# Patient Record
Sex: Male | Born: 1935 | Race: White | Hispanic: No | Marital: Married | Smoking: Former smoker
Health system: Southern US, Community
[De-identification: ages and names within clinical notes are randomized; demographics above are authoritative.]

## PROBLEM LIST (undated history)

## (undated) DIAGNOSIS — I639 Cerebral infarction, unspecified: Secondary | ICD-10-CM

## (undated) DIAGNOSIS — I2510 Atherosclerotic heart disease of native coronary artery without angina pectoris: Secondary | ICD-10-CM

## (undated) DIAGNOSIS — H349 Unspecified retinal vascular occlusion: Secondary | ICD-10-CM

## (undated) DIAGNOSIS — E039 Hypothyroidism, unspecified: Secondary | ICD-10-CM

## (undated) DIAGNOSIS — I714 Abdominal aortic aneurysm, without rupture, unspecified: Secondary | ICD-10-CM

## (undated) DIAGNOSIS — Z952 Presence of prosthetic heart valve: Secondary | ICD-10-CM

## (undated) DIAGNOSIS — I5030 Unspecified diastolic (congestive) heart failure: Secondary | ICD-10-CM

## (undated) DIAGNOSIS — N400 Enlarged prostate without lower urinary tract symptoms: Secondary | ICD-10-CM

## (undated) DIAGNOSIS — I350 Nonrheumatic aortic (valve) stenosis: Secondary | ICD-10-CM

## (undated) DIAGNOSIS — K219 Gastro-esophageal reflux disease without esophagitis: Secondary | ICD-10-CM

## (undated) DIAGNOSIS — I25810 Atherosclerosis of coronary artery bypass graft(s) without angina pectoris: Secondary | ICD-10-CM

## (undated) DIAGNOSIS — H9190 Unspecified hearing loss, unspecified ear: Secondary | ICD-10-CM

## (undated) DIAGNOSIS — I723 Aneurysm of iliac artery: Secondary | ICD-10-CM

## (undated) DIAGNOSIS — H5440 Blindness, one eye, unspecified eye: Secondary | ICD-10-CM

## (undated) DIAGNOSIS — I480 Paroxysmal atrial fibrillation: Secondary | ICD-10-CM

## (undated) DIAGNOSIS — I10 Essential (primary) hypertension: Secondary | ICD-10-CM

## (undated) HISTORY — DX: Atherosclerosis of coronary artery bypass graft(s) without angina pectoris: I25.810

## (undated) HISTORY — DX: Nonrheumatic aortic (valve) stenosis: I35.0

## (undated) HISTORY — DX: Abdominal aortic aneurysm, without rupture: I71.4

## (undated) HISTORY — PX: BUNIONECTOMY WITH HAMMERTOE RECONSTRUCTION: SHX5600

## (undated) HISTORY — DX: Atherosclerotic heart disease of native coronary artery without angina pectoris: I25.10

## (undated) HISTORY — DX: Benign prostatic hyperplasia without lower urinary tract symptoms: N40.0

## (undated) HISTORY — PX: JOINT REPLACEMENT: SHX530

## (undated) HISTORY — PX: HEMIARTHROPLASTY HIP: SUR652

## (undated) HISTORY — DX: Unspecified retinal vascular occlusion: H34.9

## (undated) HISTORY — PX: CARDIAC CATHETERIZATION: SHX172

## (undated) HISTORY — DX: Gastro-esophageal reflux disease without esophagitis: K21.9

## (undated) HISTORY — DX: Abdominal aortic aneurysm, without rupture, unspecified: I71.40

## (undated) HISTORY — DX: Aneurysm of iliac artery: I72.3

## (undated) HISTORY — DX: Unspecified diastolic (congestive) heart failure: I50.30

## (undated) HISTORY — PX: COLONOSCOPY: SHX174

## (undated) HISTORY — DX: Essential (primary) hypertension: I10

## (undated) HISTORY — PX: CATARACT EXTRACTION W/ INTRAOCULAR LENS IMPLANT: SHX1309

## (undated) HISTORY — PX: TOTAL KNEE ARTHROPLASTY: SHX125

---

## 1966-03-28 HISTORY — PX: OTHER SURGICAL HISTORY: SHX169

## 1980-03-28 HISTORY — PX: HARVEST BONE GRAFT: SHX377

## 1995-03-30 HISTORY — PX: CAROTID ENDARTERECTOMY: SUR193

## 1998-01-22 ENCOUNTER — Encounter: Payer: Self-pay | Admitting: Interventional Cardiology

## 1998-01-22 ENCOUNTER — Encounter: Payer: Self-pay | Admitting: Internal Medicine

## 1998-01-22 ENCOUNTER — Inpatient Hospital Stay (HOSPITAL_COMMUNITY): Admission: EM | Admit: 1998-01-22 | Discharge: 1998-01-31 | Payer: Self-pay | Admitting: Emergency Medicine

## 1998-01-26 ENCOUNTER — Encounter: Payer: Self-pay | Admitting: Cardiothoracic Surgery

## 1998-01-26 DIAGNOSIS — Z951 Presence of aortocoronary bypass graft: Secondary | ICD-10-CM | POA: Insufficient documentation

## 1998-01-26 HISTORY — PX: CORONARY ARTERY BYPASS GRAFT: SHX141

## 1998-01-27 ENCOUNTER — Encounter: Payer: Self-pay | Admitting: Cardiothoracic Surgery

## 1998-01-29 ENCOUNTER — Encounter: Payer: Self-pay | Admitting: Internal Medicine

## 2001-02-06 ENCOUNTER — Encounter: Payer: Self-pay | Admitting: Orthopedic Surgery

## 2001-02-08 ENCOUNTER — Inpatient Hospital Stay (HOSPITAL_COMMUNITY): Admission: RE | Admit: 2001-02-08 | Discharge: 2001-02-12 | Payer: Self-pay | Admitting: Orthopedic Surgery

## 2002-08-21 ENCOUNTER — Encounter: Payer: Self-pay | Admitting: Vascular Surgery

## 2002-08-21 ENCOUNTER — Encounter: Admission: RE | Admit: 2002-08-21 | Discharge: 2002-08-21 | Payer: Self-pay | Admitting: Vascular Surgery

## 2002-09-24 ENCOUNTER — Encounter: Payer: Self-pay | Admitting: Emergency Medicine

## 2002-09-24 ENCOUNTER — Inpatient Hospital Stay (HOSPITAL_COMMUNITY): Admission: EM | Admit: 2002-09-24 | Discharge: 2002-09-28 | Payer: Self-pay | Admitting: Emergency Medicine

## 2002-12-30 ENCOUNTER — Encounter: Payer: Self-pay | Admitting: Internal Medicine

## 2002-12-30 ENCOUNTER — Encounter: Admission: RE | Admit: 2002-12-30 | Discharge: 2002-12-30 | Payer: Self-pay | Admitting: Internal Medicine

## 2003-03-29 HISTORY — PX: BIOPSY PROSTATE: PRO28

## 2004-12-30 ENCOUNTER — Encounter: Admission: RE | Admit: 2004-12-30 | Discharge: 2004-12-30 | Payer: Self-pay | Admitting: Orthopaedic Surgery

## 2005-01-04 ENCOUNTER — Encounter (INDEPENDENT_AMBULATORY_CARE_PROVIDER_SITE_OTHER): Payer: Self-pay | Admitting: *Deleted

## 2005-01-04 ENCOUNTER — Ambulatory Visit (HOSPITAL_COMMUNITY): Admission: RE | Admit: 2005-01-04 | Discharge: 2005-01-04 | Payer: Self-pay | Admitting: Orthopaedic Surgery

## 2005-01-04 ENCOUNTER — Ambulatory Visit (HOSPITAL_BASED_OUTPATIENT_CLINIC_OR_DEPARTMENT_OTHER): Admission: RE | Admit: 2005-01-04 | Discharge: 2005-01-04 | Payer: Self-pay | Admitting: Orthopaedic Surgery

## 2005-03-28 HISTORY — PX: TOTAL HIP ARTHROPLASTY: SHX124

## 2006-06-07 ENCOUNTER — Ambulatory Visit: Payer: Self-pay | Admitting: Vascular Surgery

## 2006-10-30 ENCOUNTER — Encounter: Payer: Self-pay | Admitting: Internal Medicine

## 2006-11-01 ENCOUNTER — Other Ambulatory Visit: Payer: Self-pay

## 2006-11-02 ENCOUNTER — Inpatient Hospital Stay: Payer: Self-pay | Admitting: Specialist

## 2006-11-27 ENCOUNTER — Encounter: Payer: Self-pay | Admitting: Internal Medicine

## 2007-10-16 ENCOUNTER — Ambulatory Visit: Payer: Self-pay | Admitting: Vascular Surgery

## 2009-02-05 ENCOUNTER — Ambulatory Visit: Payer: Self-pay | Admitting: Vascular Surgery

## 2010-02-22 ENCOUNTER — Encounter: Payer: Self-pay | Admitting: Internal Medicine

## 2010-03-03 ENCOUNTER — Ambulatory Visit (HOSPITAL_COMMUNITY)
Admission: RE | Admit: 2010-03-03 | Discharge: 2010-03-03 | Payer: Self-pay | Source: Home / Self Care | Admitting: Interventional Cardiology

## 2010-03-03 ENCOUNTER — Encounter: Payer: Self-pay | Admitting: Internal Medicine

## 2010-03-24 ENCOUNTER — Ambulatory Visit: Payer: Self-pay | Admitting: Vascular Surgery

## 2010-03-28 HISTORY — PX: TRANSURETHRAL RESECTION OF PROSTATE: SHX73

## 2010-04-01 ENCOUNTER — Ambulatory Visit
Admission: RE | Admit: 2010-04-01 | Discharge: 2010-04-01 | Payer: Self-pay | Source: Home / Self Care | Attending: Interventional Cardiology | Admitting: Interventional Cardiology

## 2010-04-01 LAB — POCT I-STAT 3, ART BLOOD GAS (G3+)
Bicarbonate: 23.2 mEq/L (ref 20.0–24.0)
O2 Saturation: 93 %
TCO2: 24 mmol/L (ref 0–100)
pCO2 arterial: 33.9 mmHg — ABNORMAL LOW (ref 35.0–45.0)
pH, Arterial: 7.443 (ref 7.350–7.450)
pO2, Arterial: 64 mmHg — ABNORMAL LOW (ref 80.0–100.0)

## 2010-04-01 LAB — POCT I-STAT 3, VENOUS BLOOD GAS (G3P V)
Acid-base deficit: 2 mmol/L (ref 0.0–2.0)
Bicarbonate: 22.6 mEq/L (ref 20.0–24.0)
O2 Saturation: 65 %
TCO2: 24 mmol/L (ref 0–100)
pCO2, Ven: 38.2 mmHg — ABNORMAL LOW (ref 45.0–50.0)
pH, Ven: 7.381 — ABNORMAL HIGH (ref 7.250–7.300)
pO2, Ven: 34 mmHg (ref 30.0–45.0)

## 2010-04-06 ENCOUNTER — Encounter: Payer: Self-pay | Admitting: Internal Medicine

## 2010-04-20 ENCOUNTER — Ambulatory Visit
Admission: RE | Admit: 2010-04-20 | Discharge: 2010-04-20 | Payer: Self-pay | Source: Home / Self Care | Attending: Surgery | Admitting: Surgery

## 2010-04-21 NOTE — Letter (Signed)
April 20, 2010    Re:  HUNTLEY, KNOOP                DOB:  01-Jan-1928    REASON FOR CONSULTATION:  Moderate-to-severe aortic stenosis with dyspnea.  CLINICAL HISTORY:  I was asked by Dr. Katrinka Blazing to evaluate the patient for consideration of aortic valve replacement surgery.  He is an 77 year old gentleman with a history of severe rheumatoid arthritis who is status post bilateral knee replacements and left hip fracture repair in the past who is significantly debilitated with limited ambulation due to pain and deformity.  He underwent coronary artery bypass graft surgery by Dr. Sheliah Plane in 1999 with a left internal mammary graft to the LAD as well as saphenous vein grafts to the intermediate, obtuse marginal, and right coronary arteries.  He subsequently had a stent placed in 2004.  He has been followed with known aortic stenosis.  He recently presented with complaints of progressive dyspnea.  He had an echocardiogram done that showed an aortic valve area of around 1 cm2. He underwent cardiac catheterization on April 01, 2010, which showed pulmonary artery pressure of 39/18 with a mean right atrial pressure of 13.  Wedge pressure was 20.  The peak-to-peak gradient across the aortic valve was 44 mmHg with a mean gradient of 25 mmHg.  His cardiac output was 5.4 to 6.3.  Valve area was calculated at 1.01 cm2 by thermodilution and 1.18 cm2 by Fick.  Left ventricular ejection fraction was 55% with mild-to-moderate inferior wall hypokinesis.  There was no mitral regurgitation.  Coronary angiography showed 80% distal left main stenosis.  The LAD had a 90% mid vessel stenosis.  The left circumflex was totally occluded proximally and the right coronary artery had a 90% proximal to mid vessel segmental stenosis and was occluded distally with some bridging collaterals filling the small posterior descending and posterolateral branches.  His saphenous vein grafts to the ramus  and circumflex were widely patent.  The saphenous vein graft to right coronary artery was occluded at its origin.  The left internal mammary graft to the LAD was widely patent.  On questioning, the patient reports that he has noticed increased shortness of breath over the past year, but these episodes usually occur at night when he is lying down in bed.  He has not been sleeping on more than one pillow.  He says shortness of breath usually gets better by getting up out of bed.  He said that the shortness of breath is not present every day but occurs periodically.  He denies any exertional dyspnea but said that he does not move around very much due to his arthritis and orthopedic problems.  He mainly stays at home.  He does report occasional episodes of chest pressure but again, these usually occur when lying down at night.  He has had significant lower extremity edema recently and said that he was seen by Dr. Katrinka Blazing in the office and treated with a diuretic with marked reduction in his lower extremity edema and has not noticed much shortness of breath since then.  REVIEW OF SYSTEMS:  GENERAL:  He denies any fever or chills.  He has had no recent weight changes.  He denies fatigue, but is not very active. 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 ENT:  Negative. ENDOCRINE:  He does have a history of impaired fasting glucose in the past but denies diabetes.  He does have  hypothyroidism and is on replacement therapy. CARDIOVASCULAR:  As noted above. RESPIRATORY:  He denies cough and sputum production.  He does occasionally have wheezing. GI:  He denies nausea or vomiting.  He denies melena and bright red blood per rectum.  He does have history of constipation. GU:  He reports enlarged prostate and frequent urination. VASCULAR:  He does have what he describes as left carotid stenosis or occlusion and has been treated by Dr. Waverly Ferrari for  that. NEUROLOGICAL:  He denies any dizziness or syncope.  He denies any focal weakness or numbness.  He did have a stroke in the past which only affected his left eye causing partial blindness. MUSCULOSKELETAL:  He does have a long history of rheumatoid arthritis. He is status post bilateral knee replacements in the past and repair of left hip fracture. PSYCHIATRIC:  Negative. HEMATOLOGIC:  He does report a history of anemia.  ALLERGIES:  CIPRO which has caused some delirium.  MEDICATIONS: 1. Lasix 40 mg daily. 2. Potassium chloride 20 mEq one-half daily. 3. Tylenol Arthritis 2 p.o. b.i.d. 4. Flomax 0.4 mg daily. 5. Omeprazole 20 mg daily. 6. Zocor 20 mg at bedtime. 7. Synthroid 25 mcg daily. 8. Valium 5 mg p.r.n. daily. 9. Sublingual nitroglycerin p.r.n. 10.Aspirin 325 mg daily. 11.Norvasc 5 mg daily. 12.Lopressor 12.5 mg daily. 13.Ultram p.r.n. for pain.  PAST MEDICAL HISTORY:  Significant for hypertension and a history of borderline diabetes.  He has a history of coronary artery disease as mentioned above status post coronary artery bypass in 1999 and stent in 2004.  He has a history of aortic stenosis as mentioned above.  He has history of rheumatoid arthritis.  He has history of hypothyroidism.  He has history of prior stroke with left eye blindness.  He has history of BPH.  He has a history of gastroesophageal reflux disease.  He is status post left carotid endarterectomy in the past, status post bilateral total knee replacements in the past, status post bone grafting in his ankle in 1982, status post skin grafting in 1968, and status post left hip fracture repair in 2008.  He has had cataracts removed in 2008.  SOCIAL HISTORY:  He is retired and lives with his wife.  He is a previous smoker but quit in 1960.  He drinks occasional alcohol.  FAMILY HISTORY:  His father died at 61 years of age after abdominal aortic aneurysm repair.  His mother died at age 57 with  myocardial infarction.  One brother died at 85 with myocardial infarction.  PHYSICAL EXAMINATION:  VITAL SIGNS:  His blood pressure is 95/63, pulse is 72 and regular, respiratory rate is 24 and unlabored.  Oxygen saturation on room air is 95%.  GENERAL:  He is an elderly, chronically debilitated appearing gentleman who ambulates very slowly using a cane. HEENT:  Normocephalic and atraumatic.  Pupils are equal and reactive to light.  Extraocular muscles are intact.  His oropharynx is clear.  NECK: Normal carotid pulses bilaterally.  There is a faint transmitted murmur or bruit bilaterally.  There is no adenopathy or thyromegaly.  CARDIAC: Regular rate and rhythm with a 2/6 systolic murmur over the aorta. There is no diastolic murmur.  LUNGS:  Clear.  ABDOMEN:  Active bowel sounds.  His abdomen is soft and nontender.  There are no palpable masses or organomegaly.  CHEST:  There is a well-healed sternotomy incision and the sternum appears stable.  EXTREMITIES:  Severe rheumatoid arthritis changes of both hands with ulnar  drift of his phalanges and rheumatoid nodules in both hands.  There is mild bilateral ankle edema.  Pedal pulses are palpable.  SKIN:  Warm and dry. NEUROLOGIC:  Alert and oriented x3.  Motor and sensory exams are grossly normal.  Pulmonary function testing from March 03, 2010, showed an FEV-1 of 2.25 which was 106% of predicted, FVC of 3.35 which is 110% of predicted, and diffusing capacity that was 77% of predicted.  Chest x- ray showed no active cardiopulmonary disease.  IMPRESSION:  The patient has what I would classify as moderate aortic stenosis by echocardiogram and cardiac catheterization with patent coronary grafts except for an occluded vein graft to the right coronary artery which has a high-grade proximal and mid vessel segmental stenosis as well as a distal occlusion.  He presents with about a 1-year history of shortness of breath which sounds like it is  intermittent and usually occurring at rest when he was lying down.  It is sometimes associated with chest pressure.  He is very sedentary due to his severe rheumatoid arthritis and multiple joint replacements.  At 77 years of age and his current debilitated state, I do not think he is a candidate for redo sternotomy and aortic valve replacement.  The operative morbidity and mortality would be much too high and I am not sure that his moderate aortic stenosis is completely responsible for his symptoms of dyspnea. With his valve area at around 1 to 1.2 cm2, I would not expect him to have much symptoms unless he was really exerting himself.  He does have a high-grade right coronary stenosis which could be contributing to his symptoms and this could be considered for percutaneous intervention of the proximal and mid right coronary artery stenosis.  He does report some intermittent chest pressure at rest but it is hard to tell whether this is significant symptom or not.  He did present to Dr. Katrinka Blazing with marked lower extremity edema according to his wife and this essentially resolved with diuretic treatment and he has been much better symptomatically since then.  Therefore, would I recommend continued medical therapy as long as possible since he is very sedentary.  A percutaneous transcatheter aortic valve replacement could be considered, but I would not recommend that at this point given his valve area and the variability of his symptoms.  This procedure still has significant morbidity and mortality associated with it and should only be used for the patients who are very symptomatic with severe aortic stenosis and/or nonoperative candidates.  He may end up that way at some point, but I do not think he is currently there.  He said that he has a referral later this week to see a pulmonary medicine specialist to rule out significant pulmonary disease, although his pulmonary function test looked  fairly good.  I told him that I would discuss his case further with Dr. Katrinka Blazing, but I would not recommend any surgical intervention for him.  I answered all the questions that he and his wife had and they seem very satisfied with our recommendation.  Evelene Croon, M.D. Electronically Signed  BB/MEDQ  D:  04/20/2010  T:  04/21/2010  Job:  [id number]  cc:   Lyn Records, M.D. Theressa Millard, M.D.

## 2010-04-22 ENCOUNTER — Ambulatory Visit
Admission: RE | Admit: 2010-04-22 | Discharge: 2010-04-22 | Payer: Self-pay | Source: Home / Self Care | Attending: Internal Medicine | Admitting: Internal Medicine

## 2010-04-22 DIAGNOSIS — M069 Rheumatoid arthritis, unspecified: Secondary | ICD-10-CM | POA: Insufficient documentation

## 2010-04-22 DIAGNOSIS — R0609 Other forms of dyspnea: Secondary | ICD-10-CM | POA: Insufficient documentation

## 2010-04-22 DIAGNOSIS — R942 Abnormal results of pulmonary function studies: Secondary | ICD-10-CM | POA: Insufficient documentation

## 2010-04-22 DIAGNOSIS — R0989 Other specified symptoms and signs involving the circulatory and respiratory systems: Secondary | ICD-10-CM

## 2010-04-26 ENCOUNTER — Ambulatory Visit: Payer: Self-pay | Admitting: Cardiology

## 2010-04-27 ENCOUNTER — Telehealth: Payer: Self-pay | Admitting: Internal Medicine

## 2010-04-29 NOTE — Assessment & Plan Note (Signed)
Summary: SOB//pulmonary consult//jwr   Visit Type:  Initial Consult Copy to:  Dr. Verdis Prime Primary Provider/Referring Provider:  Dr. Mallie Mussel, [facility]  CC:  pulmonary consult forSOB. Hunter Choi  History of Present Illness: 77 year old obese male refered by Dr Garnette Scheuermann for dyspnea evaluation. Known obesity, RA with severe debility of joints and sedentary life style. . Ex 24 pack remote smoker. Known CAD and s/p CABG. Reports progressive dyspnea on exertion for past 1 year. Class 3 dyspnea. Gets dyspneic walking 10-30 feet but last 2 weeks after Dr Katrinka Blazing stated him on lasix for associated pedal edema dyspnea is significantly better and he is able to walk 150 feet wihtou dyspnea. Although he denied to Dr. Katrinka Blazing associated angina he does report to me (he admitted same to Dr Laneta Simmers as well 2 days ago) some central chest discomfort when he gets dyspneic. This too resolves along with dyspnea when he rests. Wife has noticed associated dyspnea when he lies down at night but this too is better after recent lasix Rx. He denies associated cough,  orthopnea, hemoptysis, fever, weight loss, syncope, headaches, nausea and vomit.  To date dyspnea workup reveals hgb 10.3gm% early jan 2012. PFTs that are essentially normal except for mild  isolated reduction in diffusion capacity. Cardiac cath 04/01/2010 and echo show moderate AS and also high grade RCA native lesion and SVG occlusion to RCA. He has seen Dr. Laneta Simmers and Dr. Katrinka Blazing who collectively do not feel the AS is causing this severity of dyspnea and feel he is not an operable candidate partly due to lack of critical AS and partly due to co-morbidities.   Preventive Screening-Counseling & Management  Alcohol-Tobacco     Smoking Status: quit     Packs/Day: 1.0     Year Started: 1944     Year Quit: 1960     Pack years: 32  Comments: smoked rabbit tobacco as a teen  Current Medications (verified): 1)  Tylenol Arthritis Pain 650 Mg Cr-Tabs  (Acetaminophen) .... 2 Tablets Twice Daily 2)  Flomax 0.4 Mg Caps (Tamsulosin Hcl) .... Take 1 Tablet By Mouth Once A Day 3)  Prilosec 20 Mg Cpdr (Omeprazole) .... Take 1 Tablet By Mouth Once A Day 4)  Simvastatin 20 Mg Tabs (Simvastatin) .... Take 1 Tablet By Mouth Once A Day 5)  Synthroid 25 Mcg Tabs (Levothyroxine Sodium) .... Take One Tablet 4 Times A Week On Empty Stomach 6)  Diazepam 5 Mg Tabs (Diazepam) .... Take 1 Tablet By Mouth Once A Day As Needed 7)  Nitrostat 0.4 Mg Subl (Nitroglycerin) .... One Tablet Under Tongue As Directed 8)  Aspirin 81 Mg Tbec (Aspirin) .... Take 1 Tablet By Mouth Once A Day 9)  Amlodipine Besylate 5 Mg Tabs (Amlodipine Besylate) .... Take 1 Tablet By Mouth Once A Day 10)  Metoprolol Tartrate 25 Mg Tabs (Metoprolol Tartrate) .... Take 1/2 Tablet Daily 11)  Furosemide 40 Mg Tabs (Furosemide) .... Take 1 Tablet By Mouth Once A Day 12)  Klor-Con 10 10 Meq Cr-Tabs (Potassium Chloride) .... Take 1 Tablet By Mouth Once A Day 13)  Tramadol Hcl 50 Mg Tabs (Tramadol Hcl) .... One Every 6 Hours As Needed  Allergies (verified): 1)  ! Cipro  Past History:     Past Medical History: CAD, 2004, CABG 1999  - s/p cath 04/01/2010  1. Moderately severe to severe aortic stenosis.   2. Left ventricular dysfunction with inferior hypokinesis.  Ejection  fraction 55%.   3. Severe native vessel coronary disease with distal 80% left main,       90% mid left anterior descending, total occlusion of the proximal       circumflex, 100% distal right coronary artery with proximal 90%       right coronary artery.   4. Patent saphenous vein graft to the internal mammary, patent       saphenous vein graft to the obtuse marginal, total occlusion of the       saphenous vein graft to the right coronary artery.  The left       internal mammary graft is widely patent.     Rheumatoid Arthritis  - since age 83  - recollects renal failure and abn lytes to some DMARD. Does not want  to see rheum  Hypertension Hyperkalemia MVP GERD Hypothyroidism Moderate  Aortic Stenosis...dr Katrinka Blazing, and Dr. Laneta Simmers  - Valve area 1-1.2cm dec 2011 with gradient.  Moderate. Not a surgical candidate due to lack of severity and significant co-morbidities Rectal Polyps 2005, neg colon 2008 Retinal Artery Emboli BPH Stroke, blind in left eye-1998 Skin Cancer-excision of squamous cell carcinoma Anemia NOS  - hgb 10.3gm% Jan 2012  Social History: Married Former Smoker, quit in 1960, started in 1944 1ppd. Pt states he smoked rabbit tobacco as a young teen.  RetiredSmoking Status:  quit Pack years:  24 Packs/Day:  1.0  Review of Systems       The patient complains of shortness of breath with activity, shortness of breath at rest, productive cough, indigestion, nasal congestion/difficulty breathing through nose, sneezing, and joint stiffness or pain.  The patient denies non-productive cough, coughing up blood, chest pain, irregular heartbeats, acid heartburn, loss of appetite, weight change, abdominal pain, difficulty swallowing, sore throat, tooth/dental problems, headaches, itching, ear ache, anxiety, depression, hand/feet swelling, rash, change in color of mucus, and fever.    Vital Signs:  Patient profile:   77 year old male Height:      64.5 inches Weight:      191.38 pounds BMI:     32.46 O2 Sat:      97 % on Room air Temp:     97.8 degrees F oral Pulse rate:   67 / minute BP sitting:   122 / 78  (right arm) Cuff size:   regular  Vitals Entered By: Carron Curie CMA (April 22, 2010 2:04 PM)  O2 Flow:  Room air CC: pulmonary consult forSOB.  Comments Medications reviewed with patient Carron Curie CMA  April 22, 2010 2:05 PM Daytime phone number verified with patient.    Physical Exam  General:  well developed, well nourished, in no acute distress Head:  normocephalic and atraumatic Eyes:  PERRLA/EOM intact; conjunctiva and sclera clear Ears:  TMs  intact and clear with normal canals Nose:  no deformity, discharge, inflammation, or lesions Mouth:  no deformity or lesions Neck:  no masses, thyromegaly, or abnormal cervical nodes Chest Wall:  no deformities noted Lungs:  clear bilaterally to auscultation and percussion Heart:  regular rate and rhythm, S1, S2 without murmurs, rubs, gallops, or clicks Abdomen:  bowel sounds positive; abdomen soft and non-tender without masses, or organomegaly Msk:  no deformity or scoliosis noted with normal posture Pulses:  pulses normal Extremities:  no clubbing, cyanosis, edema, or deformity noted Neurologic:  CN II-XII grossly intact with normal reflexes, coordination, muscle strength and tone Skin:  intact without lesions or rashes Cervical Nodes:  no significant  adenopathy Axillary Nodes:  no significant adenopathy Psych:  alert and cooperative; normal mood and affect; normal attention span and concentration   MISC. Report  Procedure date:  03/03/2010  Findings:       Pulmonary function testing from March 03, 2010, showed an FEV-1 of   2.25 which was 106% of predicted, FVC of 3.35 which is 110% of   predicted, and diffusing capacity that was 77% of predicted.  C  MISC. Report  Procedure date:  03/30/2010  Findings:      hgb 10.3gm% creat 0.9mg % alb 3.6  Impression & Recommendations:  Problem # 1:  DYSPNEA ON EXERTION (ICD-786.09) Assessment New Differential diagnosis for dyspnea is multifactorial and  includes anemia (hgb 10.5gm5) severe deconditioning (due to RA,, age and immobilty) CAD-angina equivalent (does have mild chest discomfort with dyspnea), moderate AS and undiagnosed pulmnary disease such as copd from prior smoking or RA relatd pulmonary fibrosis. He has had remarkable improvment with diuresis and also PFTs are relatively normal except for isolated reduction in dlco which could be explained by anemia. Therefore, I really doubt the presence of lung disease like COPD or  pulmonary fibrosis. Nevertheless will get CT chest to rule out fibrosis and emphysema.  I have urged him to also attend cardiac/pulmonary rehab - he has reluctantly agreed and Dr Katrinka Blazing is ok with it. I also d/w Dr Katrinka Blazing who will reassess patient about chest discomfort and see if medical Rx for angina Rx could be optimized.   I will call him with results of CT and then go from there His updated medication list for this problem includes:    Metoprolol Tartrate 25 Mg Tabs (Metoprolol tartrate) .Hunter Choi... Take 1/2 tablet daily    Furosemide 40 Mg Tabs (Furosemide) .Hunter Choi... Take 1 tablet by mouth once a day  Orders: Consultation Level V (29528) Radiology Referral (Radiology)  Medications Added to Medication List This Visit: 1)  Furosemide 40 Mg Tabs (Furosemide) .... Take 1 tablet by mouth once a day 2)  Klor-con 10 10 Meq Cr-tabs (Potassium chloride) .... Take 1 tablet by mouth once a day 3)  Tramadol Hcl 50 Mg Tabs (Tramadol hcl) .... One every 6 hours as needed  Patient Instructions: 1)  hvae ct scan of chest 2)   I will be talking to Dr. Mendel Ryder about your 3)  I will call you with results and decide what we do next 4)  I strongly urge to you consider cardiac or pulmonary rehab   Immunization History:  Influenza Immunization History:    Influenza:  historical (01/26/2010)  Pneumovax Immunization History:    Pneumovax:  historical (03/30/1998)   Appended Document: SOB//pulmonary consult//jwr Ambulatory Pulse Oximetry  Resting; HR__73___    02 Sat__95___  Lap1 (185 feet)   HR__79___   02 Sat__94___ Lap2 (185 feet)   HR_____   02 Sat_____    Lap3 (185 feet)   HR_____   02 Sat_____  ___Test Completed without Difficulty __x_Test Stopped due to: pain in pt legs

## 2010-05-05 NOTE — Progress Notes (Signed)
Summary: ct results  Phone Note Outgoing Call   Summary of Call: Candise Bowens, Please tell patient that a) spoke to Dr. Katrinka Blazing who will see patient again to go over chest pain hx and see if patient meds can be optimized. Please ensure he sees Dr. Katrinka Blazing; b) Ct suggests mild scarring of lungs from Rheumatoid arthritis or prior potential asbestors exposure. Nothing to do about this in terms of tablets. So. I want him to attend pulmonary rehab and return in 2-3 months for followup (order done) Initial call taken by: Kalman Shan MD,  April 27, 2010 3:31 PM  Follow-up for Phone Call        Pt set to see Dr. Garnette Scheuermann on 2-16 at 10:5. I advised pt son of appt and ct results and referral for rehab. Carron Curie CMA  April 29, 2010 4:27 PM also reminder set for 2-3- mth follow-up. Carron Curie CMA  April 29, 2010 4:27 PM

## 2010-05-05 NOTE — Letter (Signed)
Summary: Verdis Prime MD/[facility]  Verdis Prime MD/[facility]   Imported By: Lester Dupont 04/28/2010 08:27:20  _____________________________________________________________________  External Attachment:    Type:   Image     Comment:   External Document

## 2010-08-10 NOTE — Procedures (Signed)
CAROTID DUPLEX EXAM   INDICATION:  Followup known carotid disease.   HISTORY:  Diabetes:  No.  Cardiac:  CABG.  Hypertension:  Yes.  Smoking:  No.  Previous Surgery:  Right carotid endarterectomy with Dacron patch  angioplasty in 1997.  CV History:  No.  Amaurosis Fugax No, Paresthesias No, Hemiparesis No                                       RIGHT             LEFT  Brachial systolic pressure:         110               120  Brachial Doppler waveforms:         Normal            Normal  Vertebral direction of flow:        Antegrade         Antegrade  DUPLEX VELOCITIES (cm/sec)  CCA peak systolic                   127               65  ECA peak systolic                   91                144  ICA peak systolic                   95                Occluded  ICA end diastolic                   26                Occluded  PLAQUE MORPHOLOGY:                  Homogeneous       Heterogeneous  PLAQUE AMOUNT:                      Mild              Severe  PLAQUE LOCATION:                    CCA               ECA, ICA   IMPRESSION:  1. Right internal carotid artery stenosis of 30%-39% status post right      endarterectomy.  2. Occluded left internal carotid artery.  3. Bilateral vertebral arteries appear antegrade.   ___________________________________________  Di Kindle. Edilia Bo, M.D.   CB/MEDQ  D:  02/05/2009  T:  02/05/2009  Job:  [id number]

## 2010-08-10 NOTE — Procedures (Signed)
CAROTID DUPLEX EXAM   INDICATION:  Follow up carotid artery disease.   HISTORY:  Diabetes:  No.  Cardiac:  CABG.  Hypertension:  Yes.  Smoking:  No.  Previous Surgery:  Right carotid endarterectomy with a Dacron patch  angioplasty in 1997.  CV History:  Patient is currently asymptomatic.  Amaurosis Fugax No, Paresthesias No, Hemiparesis No.                                       RIGHT             LEFT  Brachial systolic pressure:  Brachial Doppler waveforms:  Vertebral direction of flow:        Antegrade  DUPLEX VELOCITIES (cm/sec)  CCA peak systolic                   81  ECA peak systolic                   105  ICA peak systolic                   98  ICA end diastolic                   21  PLAQUE MORPHOLOGY:                  Heterogenous  PLAQUE AMOUNT:                      Mild  PLAQUE LOCATION:                    CCA, bulb   IMPRESSION:  Patent and durable right carotid endarterectomy with no  hemodynamically significant stenosis.   ___________________________________________  Di Kindle. Edilia Bo, M.D.   OD/MEDQ  D:  03/24/2010  T:  03/24/2010  Job:  [id number]

## 2010-08-10 NOTE — Procedures (Signed)
DUPLEX ULTRASOUND OF ABDOMINAL AORTA   INDICATION:  Follow-up evaluation of known abdominal aortic aneurysm.   HISTORY:  Diabetes:  No.  Cardiac:  Coronary artery bypass graft in 1999 by Dr. Tyrone Sage.  Hypertension:  No.  Smoking:  No.  Connective Tissue Disorder:  Family History:  The patient's father had an abdominal aortic aneurysm.  Previous Surgery:  Patient had a carotid endarterectomy in 1997 by Dr.  Edilia Bo.  Patient has severe arthritis.   DUPLEX EXAM:         AP (cm)                   TRANSVERSE (cm)  Proximal             1.9 Cm                    2.1 cm  Mid                  1.7 cm                    1.9 cm  Distal               3.5 cm                    3.6 cm  Right Iliac          3.6 cm                    3.6 cm  Left Iliac           3.2 cm                    3.6 cm   PREVIOUS:  Date: 06/07/06  AP:  3.5  TRANSVERSE:  3.4   IMPRESSION:  1. Abdominal aortic aneurysm measurements are stable, compared to      previous study.  2. Common iliac artery aneurysm measurements are stable, compared to      previous study.   ___________________________________________  Di Kindle. Edilia Bo, M.D.   MC/MEDQ  D:  10/16/2007  T:  10/16/2007  Job:  [id number]

## 2010-08-10 NOTE — Assessment & Plan Note (Signed)
OFFICE VISIT   Hunter Choi, Hunter Choi  DOB:  Dec 18, 1927                                       02/05/2009  [id number]#:[id number]   I saw the patient in the office today for continued followup of his  abdominal aortic aneurysm and bilateral common iliac artery aneurysms.  In addition, I have been following his carotid disease.  I last saw him  in March of 2008 although he has had duplex scans in the interim.  Since  I saw him last with respect to his aneurysm he has had no new abdominal  pain.  He does have chronic low back pain and significant arthritis.  His biggest complaint has been pain in his left hip where he has had  previous left hip surgery.  This has been going on for 2 years and has  been relatively stable.  The symptoms are aggravated by cold weather.  With respect to his history of cerebrovascular disease he has undergone  previous right carotid endarterectomy.  He has a known left internal  carotid artery occlusion.  He has had no history of stroke, TIAs,  expressive or receptive aphasia or amaurosis fugax.  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   PAST MEDICAL HISTORY:  The patient's past medical history is significant  for significant rheumatoid arthritis and also hypertension.  He also has  undergone previous coronary revascularization by Dr. Tyrone Sage in 1999  and also has had a previous PTCA by Dr. Garnette Scheuermann.  He denies any  history of diabetes, history of previous myocardial infarction, history  of congestive heart failure or history of COPD.   FAMILY HISTORY:  There is no history of premature cardiovascular  disease.   SOCIAL HISTORY:  He is married.  He has 2 children.  He quit tobacco in  1962.   REVIEW OF SYSTEMS:  He does admit to dyspnea on exertion.  He has had no  chest pain, chest pressure, palpitations or arrhythmias.  GI:  He has had some problems with constipation.  MUSCULOSKELETAL:  He has  significant arthritis.  General, pulmonary, GU, psychiatric, ENT, hematologic review of systems  is unremarkable and is documented on the medical history form in his  chart.   PHYSICAL EXAMINATION:  General:  This is a pleasant 77 year old  gentleman who appears his stated age.  Vital signs:  Blood pressure  104/68, heart rate 62, temperature 97.8.  HEENT:  Extraocular motions  are intact.  Conjunctivae are normal.  There is no facial asymmetry.  Neck:  Supple.  There is no JVD or cervical lymphadenopathy.  Lungs:  Are clear bilaterally to auscultation without rales, rhonchi or  wheezing.  Cardiovascular:  I do not appreciate any carotid bruits.  There is a regular rate and rhythm without murmur or gallop appreciated.  The patient has a mild peripheral edema and I cannot palpate pedal  pulses, however, both feet are warm and well-perfused without ischemic  ulcers.  Abdomen:  Soft, nontender.  The aneurysm is nontender to  palpation.  He has normal pitched bowel sounds.  Musculoskeletal:  There  is significant rheumatoid arthritis in all extremities.  Neurological:  There is no focal weakness or paresthesias.  Skin:  There are no ulcers  or rashes.   I have independently interpreted the carotid duplex  scan which shows no  evidence of recurrent carotid stenosis on the right.  The patient has a  known left internal carotid artery occlusion.   I have also independently interpreted the duplex of the abdominal aortic  aneurysm which shows the maximum diameter of the infrarenal aorta is 3.7  cm distally.  The right common iliac artery measures 3.7 cm in maximum  diameter and the left common iliac measures 3.5 cm in maximum diameter.  In reviewing the previous ultrasounds back to March of 2008 there really  has been no significant change in size of the abdominal aortic aneurysm  or either common iliac artery aneurysm.   Given that the aneurysm has been stable in size over several years I   think we are safe to continue with 1 year followup studies.  I will see  him back in 1 year with a followup ultrasound.  With respect to his  carotid disease I think we will continue with yearly followup visits.  He does know to continue taking his aspirin.   Di Kindle. Edilia Bo, M.D.  Electronically Signed   CSD/MEDQ  D:  02/05/2009  T:  02/06/2009  Job:  [id number]

## 2010-08-10 NOTE — Procedures (Signed)
CAROTID DUPLEX EXAM   INDICATION:  Follow-up evaluation of known carotid artery disease.   HISTORY:  Diabetes:  No.  Cardiac:  Coronary artery bypass graft.  Hypertension:  Yes.  Smoking:  No.  Previous Surgery:  Right carotid endarterectomy with Dacron patch  angioplasty in 1997.  CV History:  Patient reports no cerebrovascular symptoms at this time.  Previous duplex on June 07, 2006, revealed a 20-39% right internal  carotid artery stenosis status post endarterectomy and an occluded left  internal carotid artery.  Patient is blind in the left eye.  Amaurosis Fugax No, Paresthesias No, Hemiparesis No                                       RIGHT             LEFT  Brachial systolic pressure:         158               156  Brachial Doppler waveforms:         Triphasic         Triphasic  Vertebral direction of flow:        Antegrade         Antegrade  DUPLEX VELOCITIES (cm/sec)  CCA peak systolic                   80                45  ECA peak systolic                   60                103  ICA peak systolic                   90                Occluded  ICA end diastolic                   18                Occluded  PLAQUE MORPHOLOGY:                  Mixed             Calcified  PLAQUE AMOUNT:                      Mild to moderate  Severe  PLAQUE LOCATION:                    Distal CCA        Throughout ICA   IMPRESSION:  1. Right ICA stenosis 20-39% status post endarterectomy.  2. Occluded left ICA.  3. No significant change from previous study performed June 07, 2006.   ___________________________________________  Di Kindle. Edilia Bo, M.D.   MC/MEDQ  D:  10/16/2007  T:  10/16/2007  Job:  [id number]

## 2010-08-10 NOTE — Procedures (Signed)
DUPLEX ULTRASOUND OF ABDOMINAL AORTA   INDICATION:  Followup known abdominal aortic aneurysm.   HISTORY:  Diabetes:  No  Cardiac:  CABG  Hypertension:  yes  Smoking:  No  Connective Tissue Disorder:  Family History:  No  Previous Surgery:  No   DUPLEX EXAM:         AP (cm)                   TRANSVERSE (cm)  Proximal             2.06 cm                   2.11 cm  Mid                  2.35 cm                   2.54 cm  Distal               3.68 cm                   3.34 cm  Right Iliac          3.70 cm                   3.25 cm  Left Iliac           3.54 cm                   2.84 cm   PREVIOUS:  Date:  06/07/06  AP:  3.5  TRANSVERSE:  3.6   IMPRESSION:  1. Abdominal aortic aneurysm measurements are stable compared to      previous study.  2. Common iliac artery aneurysm measurements are stable compared to      previous study.   ___________________________________________  Di Kindle. Edilia Bo, M.D.   CB/MEDQ  D:  02/05/2009  T:  02/05/2009  Job:  [id number]

## 2010-08-10 NOTE — Procedures (Signed)
DUPLEX ULTRASOUND OF ABDOMINAL AORTA   INDICATION:  Followup of an abdominal aortic aneurysm.   HISTORY:  Diabetes:  No.  Cardiac:  CABG.  Hypertension:  Yes.  Smoking:  No.  Connective Tissue Disorder:  Family History:  Dad.  Previous Surgery:  No.   DUPLEX EXAM:         AP (cm)                   TRANSVERSE (cm)  Proximal             1.9 cm                    2.3 cm  Mid                  2.1 cm                    2.5 cm  Distal               3.6 cm                    3.6 cm  Right Iliac          4.0 cm                    4.4 cm  Left Iliac           3.5 cm                    4.5 cm   PREVIOUS:  Date:  AP:  3.7  TRANSVERSE:  3.3   IMPRESSION:  Abdominal aortic aneurysm within the mid distal aorta with  intraluminal thrombus and atherosclerosis.  Bilateral common iliac  artery aneurysms with a possible increase in size from previous studies.   ___________________________________________  Di Kindle. Edilia Bo, M.D.   OD/MEDQ  D:  03/24/2010  T:  03/24/2010  Job:  [id number]

## 2010-08-13 NOTE — Discharge Summary (Signed)
Hunter Choi. Hunter Choi [facility]  Patient:    Hunter Choi, Hunter Choi Visit Number: [id number] MRN: [id number]          Service Type: SUR Location: [id number] Attending Physician:  Hunter Choi Dictated by:   Hunter Choi, P.A. Admit Date:  02/08/2001 Discharge Date: 02/12/2001                             Discharge Summary  ADMISSION DIAGNOSES: 1. Left knee degenerative joint disease. 2. History of hypertension. 3. History of rheumatoid arthritis. 4. History of aneurysm. 5. History of heart murmur. 6. 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 7. Status post right total knee arthroplasty x 10 years ago. 8. Status post coronary artery bypass grafting.  DISCHARGE DIAGNOSES:  1. Left knee degenerative joint disease.  2. Hyponatremia, asymptomatic.  3. Postoperative hemorrhagic anemia, stable.  4. History of hypertension.  5. History of rheumatoid arthritis.  6. History of aneurysm.  7. History of heart murmur.  8. 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  9. Status post right total knee arthroplasty x 10 years ago. 10. Status post coronary artery bypass grafting.  PROCEDURE:  On February 06, 2001, the patient underwent a left total knee arthroplasty performed by Dr. Francena Choi, assistant [facility]. Anesthesia spinal.  No complications.  CONSULTATIONS:  None.  HISTORY OF PRESENT ILLNESS:  Hunter Choi is a 77 year old gentleman who has a long history of left knee pain with progressive, increasing functional limitations.  On clinical exam in the office, it was noted that he had varus alignment of the knee with decreased range of motion.  Radiograph in the office revealed complete obliteration of the medial joint space with crystallized slight formation and in addition, evidence of tricompartmental arthrosis as well.  It was felt he would benefit from undergoing a total knee arthroplasty.  Risks  and benefits as well as the procedure were discussed with the patient and he agreed to proceed.  HOSPITAL COURSE:  The patient had the above-stated surgery without complications while in the operating room.  Hemovac drains x 2 were placed. These were discontinued on postop day #1 without difficulty.  While in the operating room, the incision was dressed in a sterile fashion with a bulky dressing.  This dressing was clean, dry and intact on postop day #1.  The incision was checked daily and found to be free of any erythema or drainage. Regular diet was started and he tolerated this well.  He had good p.o. intake and BMs while in the hospital.  Ancef was used postoperatively for infection control.  His home medication of Lipitor, hydrochlorothiazide and Diovan were resumed postoperatively.  Initially, he utilized a PCA morphine for pain. This was discontinued by postop day #2 and he utilized Percocet throughout the remainder of his hospital stay.  He utilized both site and incentive spirometer while in the hospital.  While in the operating room, the Foley catheter was placed.  This was discontinued on postop day #2 without difficulty and the patient was voiding on his own well throughout the remainder of his hospital stay.  He was weightbearing as tolerated to the left lower extremity.  He progressed well with physical therapy and occupational therapy with ambulation.  Hemoglobin and hematocrit were checked daily x 3 days and found to be stable.  BMET was within normal limits other than some mild hyponatremia while  in the hospital.  He was placed on fluid restrictions while in the hospital.  Prior to discharge, I did confer with his medical doctor, Dr. Earl Choi, and he was asymptomatic on the day of discharge and it was felt that he could be discharged home to follow up with Dr. Earl Choi on an outpatient basis.  He utilized O2 via nasal cannula for the first 24 hours and tolerated  discontinuation of this well.  He was placed on Coumadin x 3 weeks for DVT prophylaxis.  This was monitored and dosed by the pharmacy throughout his hospital stay.  Social work worked up the patient in setting up home health needs such as physical therapy with [facility].  On February 12, 2001, he was felt to be medically and orthopedically stable for discharge.  CONDITION ON DISCHARGE:  Improved.  DISPOSITION:  Discharged to home with physical therapy and Coumadin therapy through [facility].  ACTIVITY:  Weightbearing as tolerated to the left lower extremity.  Shower on postop day #5.  WOUND CARE:  Daily dressing changes.  FOLLOWUP:  Follow up with Dr. Rennis Choi in two weeks from date of surgery.  He is to have a BMET drawn the following Monday and this would be sent to Dr. Karma Choi office for further management.  DISCHARGE MEDICATIONS: 1. Resume home medications except aspirin and Tylenol. 2. Percocet. 3. Robaxin. 4. Coumadin per pharmacy.  SPECIAL INSTRUCTIONS:  He should call if he has any questions or concerns prior to this appointment. Dictated by:   Hunter Choi, P.A. Attending Physician:  Hunter Choi DD:  03/14/01 TD:  03/15/01 Job: [id number]

## 2010-08-13 NOTE — Discharge Summary (Signed)
NAME:  Hunter Choi, Hunter Choi NO.:  [id number]   MEDICAL RECORD NO.:  [id number]                   PATIENT TYPE:  INP   LOCATION:  6533                                 FACILITY:  [facility]   PHYSICIAN:  Lyn Records, M.D.                DATE OF BIRTH:  Aug 21, 1927   DATE OF ADMISSION:  09/24/2002  DATE OF DISCHARGE:  09/28/2002                                 DISCHARGE SUMMARY   CONSULTANT:  Georgann Housekeeper, M.D., Internal Medicine.   PRIMARY CARE [place]:  Theressa Millard, M.D.   DISCHARGE DIAGNOSES:  Acute coronary syndrome.  a.  Mild elevation of CK 224, MB fraction 8.1, relative index 3.6.  Negative  serial troponin-I x3.  b.  Prior coronary artery bypass grafting x4 in November 1999.  On September 24, 2000, diagnostic cardiac catheterization by Dr. Verdis Prime, revealing  native left main 90%.  Native left anterior descending with 75% mid,  diagonal 2 90%.  Left internal mammary artery to left anterior descending  was patent, though collaterals, left anterior descending beyond graft was  99% occluded.  Dr. Katrinka Blazing was unable to engage the left internal mammary  artery.  Native circumflex 100%; saphenous vein graft to obtuse marginal was  patent.  Native right coronary artery 100% distal; filled by collaterals.  Saphenous vein graft to right coronary artery was 100% occluded.  Normal  left ventricular function.  Subsequent (September 27, 2002), successful stent by  Dr. Verdis Prime of the left anterior descending via left internal mammary  artery.  Taxus drug eluding stent placed.   DISCHARGE DIAGNOSES BY PROBLEM LIST:  1. Acute coronary syndrome.     a. Mild elevation CK 224 with MB fraction 8.1, troponin-I negative x3.        Initial treatment with aspirin, IV nitrates and heparin.  Continued        unstable angina.  On August 29, 2002, coronary angiography by Dr. Verdis Prime with the following results:  A)  LV gram normal.  B)  Coronary        angiography:   Native vessel with 90% left main, 75% left anterior        descending with 90% diagonal 2.  Circumflex 100%.  Distal right        coronary artery 100%, which did fill by collaterals.  C)  Bypass graft        angiography:  Saphenous vein graft to obtuse marginal patent,        saphenous vein graft to diagonal irregularities, though patent.        Saphenous vein graft to right coronary artery:  100% occluded.  Left        internal mammary artery to left anterior descending was patent, though        99% native left anterior descending beyond graft.  Dr. Katrinka Blazing  was        unable to engage the left internal mammary artery.  D)  Impression:        99% native left anterior descending beyond left internal mammary        artery insertion site.  Occluded saphenous vein graft to right        coronary artery.  Severe native disease; 90% left main, 100%        circumflex, 100% distal right coronary artery.  Normal left        ventricular function.   Dr. Katrinka Blazing planned to attempt PCI on distal LAD via LIMA.  If this was  unsuccessful, he felt he may need to approach the left arm.   On September 27, 2002, the patient returned to cath lab where he underwent  successful stenting of the LAD (distal) via the LIMA with reduction stenosis  of 99% to less then 10%.  He placed a Taxus drug-eluding stent.  The patient  was placed on IV Integrilin.  This IV course was prematurely interrupted  secondary to development of hypotension and groin hematoma.  Patient  discharged home on aspirin and Plavix (anticipate a six-month course for the  Plavix).   1. Shingles, recently diagnosed prior to this admission. Vesicular papular     eruptions on his back were noted.  The patient was continued on Zovirax     for an additional two day course post discharge.  2. Constipation. Treated with Dulcolax suppository.  3. Hyponatremia, thought related to hydrochlorothiazide.  Serum sodium     ranging 119 to 120's.  He has known  hyponatremia with prior admission.     Serum sodium's ranging 120's back at hospital admission of November 2002.     Hyponatremia thought related to the hydrochlorothiazide.  This was     decreased at time of discharge with Diovan adjusted upward.  Consult     obtained on this issue by Dr. Georgann Housekeeper.  4. Hyperglycemia.  The patient without known history of diabetes.  His blood     sugars were found to be elevated ranging 107 to 208.  He had negative     storage of glucose in his urine.  Hemoglobin A1C was slightly elevated at     6.9 (reference range 4.6 to 6.5).  Plan for followup with the patient's     primary doctor, Dr. Earl Gala as an outpatient.  5. Hypokalemia with serum potassium of 3.5.  Was supplemented.  Potassium     3.7 at time of discharge.  6. Hyperlipidemia, history of.  Will continue on Lipitor as an outpatient.  7. History of hypertension.  Blood pressure was controlled to elevated range     during course of admission.  8. Urinary tract infection.  Urine culture results obtained after discharge     revealing positive Pseudomonas.  We will contact primary care for     followup.  9. History of rheumatoid arthritis/osteoarthritis/degenerative joint disease     with prior bilateral total knee replacements, most recently November 2002     by Dr. Rennis Chris.  10.      History of cerebrovascular accident with subsequent left eye     blindness.  11.      History of carotid artery disease with right carotid     endarterectomy, either 1996 or 1997.  Per patient, has totally occluded     left carotid artery.   PLAN:  The patient is discharged home in stable condition.  DISCHARGE MEDICATIONS:  1. Plavix 75 mg one p.o. daily, anticipate a six month course.  2. Aspirin, enteric-coated, 325 mg p.o. daily.  3. Diovan 80 mg one p.o. b.i.d.  4. Lipitor 10 mg p.o. daily.  5. Diazepam as before. 6. Hydrochlorothiazide 25 mg 1/2 tab p.o. daily (decreased dose).  7. Nitroglycerin  tablet 0.4 mg sublingual p.r.n.  8. Zovirax 800 mg one tab 6 a.m., 10 a.m., 2 p.m., 6 p.m., and 10 p.m. x2     days then discontinue.   ACTIVITY:  As tolerated.   SPECIAL INSTRUCTIONS:  1. Follow up with Dr. Verdis Prime in two weeks.  The patient will call to     arrange.  2. The patient will need fasting blood sugar as an outpatient, follow up     with Dr. Earl Gala.   HISTORY OF PRESENT ILLNESS:  Hunter Choi is a pleasant 77 year old  gentleman with a history of prior CABG x4 in November 1999;  right carotid  endarterectomy in 1996, history of hypertension and dyslipidemia.  History  of prior CVA with subsequent left eye blindness.  The patient is admitted  with anterior chest uneasiness reminiscent of his angina.  He had a mild  bump in CK/MB, but troponin-I's were negative.  He was taken for coronary  angiography, details as above, revealing distal LAD (beyond LIMA insertion  site) disease, occluded saphenous vein graft to RCA with 100% native distal  RCA (filled by collaterals), 100% mid CFX with a patent SVG to OM, patent  saphenous vein graft to diagonal.  The LIMA to LAD was patent.  LV function  was normal.  The patient underwent a successful subsequent placement of a  drug-eluding Taxus stent distal LAD via the LIMA.  Hospital course  essentially uncomplicated, though he had continued eruptions of a vesicular  papular rash on his back;  had been diagnosed with shingles prior to  admission.  He will be continued on Zovirax post discharge.  A low serum  potassium was supplemented.  The patient has chronic mild hyponatremia with  serum sodium ranging 120's.  Thought related to hydrochlorothiazide.  His  dose of HCTZ was decreased at time of discharge with increase in his Diovan.  Dr. Georgann Housekeeper was consulted for the hyponatremia and also consulted for  elevated blood sugars as high as 208.  Hemoglobin A1C was obtained, found to  be slightly elevated at 6.9.  Dr. Donette Larry  recommended followup with primary  care as an outpatient.  He felt elevated blood sugars could be related to  stress and/or IV fluids.  He noted urine was negative for glucose.   An urine culture was found to be positive for Pseudomonas;  results were not  available until time of this dictation.  I will call Dr. Newell Coral office  for a followup.  The patient had positive constipation during the course of  this admission, treated with Dulcolax suppositories.   LABORATORY DATA:  Chest x-ray on September 24, 2002, was negative for evidence of  acute cardiopulmonary disease.  Admission WBC 7.7, hemoglobin 12.6,  hematocrit 37.2, platelets 271.  Differential within normal range.  Stools  for occult blood was negative.  Admission coags were within normal range.  Admission sodium 119, potassium 3.5, chloride 83, glucose 177, BUN 18,  creatinine 1.1.  LFTs within normal limits.  Albumin decreased at 3.  At time of discharge, serum sodium was 123, potassium 3.7, chloride 92, glucose  208, BUN 11, creatinine 1.  Hemoglobin A1C was slightly elevated at 6.9  (reference range 4.6 to 6.5).  Serial cardiac enzymes:  First CK was 224, MB  fraction 8.1, relative index 3.6, troponin-I 0.01.  Second CK of 195, MB  6.8, relative index 3.5, troponin-I of 0.01.  Third CK of 109, MB fraction  8.6, relative index 4.1, troponin-I of less then 0.01.  Fourth CK of 124, MB  fraction 4.  Fifth CK of 94, MB fraction of 3.1.  Sixth CK of 84, MB  fraction 3.3.  Urine was negative for glucose, trace hemoglobin.  Ketones  elevated at 15 mg/dL.  Negative protein, negative nitrite, and negative  leukocyte esterase.  Urine culture was positive for greater then 100,000  colonies of Pseudomonas.      Salomon Fick, N.P.                       Lyn Records, M.D.    MES/MEDQ  D:  10/31/2002  T:  11/01/2002  Job:  [id number]   cc:   Georgann Housekeeper, M.D.  [address]  [place]  [place] [address]  Fax: [phone]    Theressa Millard, M.D.  [address] [place]  [place] [address]  Fax: [phone]

## 2010-08-13 NOTE — Op Note (Signed)
[place]  Patient:    Hunter Choi, Hunter Choi Visit Number: [id number] MRN: [id number]          Service Type: SUR Location: [id number] Attending Physician:  Cain Sieve Dictated by:   Vania Rea. Supple, M.D. Proc. Date: 02/08/01 Admit Date:  02/08/2001                             Operative Report  PREOPERATIVE DIAGNOSIS:  Left knee end stage osteoarthritis.  POSTOPERATIVE DIAGNOSIS:  Left knee end stage osteoarthritis.  PROCEDURE:  Osteonics cemented Scorpio posterior stabilized total knee arthroplasty, utilizing a size 7 femur, a size 9 tibia, a 26 mm patellar insert, and a 10 mm thick polyethylene insert for the tibial tray.  SURGEON:  Vania Rea. Supple, M.D.  ASSISTANTJill Side P. Mahar, P.A.  ANESTHESIA:  Spinal.  TOURNIQUET TIME:  90 minutes.  ESTIMATED BLOOD LOSS:  Minimal.  DRAINS:  Hemovacs x 2.  HISTORY OF PRESENT ILLNESS:  Mr. Chewning is a 77 year old gentleman who has had a long history of left knee pain with progressively increasing functional limitations.  His clinical examination shows a varus alignment of the knee with ______ range of motion and a 10 degree flexion contracture.  His radiographs show complete obliteration of medial joint space with crystallized phyte formation, and additional evidence for tricompartmental arthrosis as well.  He is brought to the operating room at this time for planned total knee arthroplasty.  Preoperatively, Mr. Hellmann had been counseled on treatment options, as well as risks versus benefits.  Possible complications of bleeding, infection, neurovascular injury, deep venous thrombosis, pulmonary embolism, persistence of pain, loss of motion, possible need for revision of the implant were all reviewed.  He understands and accepts and agrees with our planned procedure.  DESCRIPTION OF PROCEDURE:  After undergoing routine preoperative evaluation the patient was brought to the operating  room and placed in the lateral decubitus position where a spinal anesthetic was established.  He was then placed supine, Foley catheter was placed.  Received 1 g of IV ______ prophylactically.  Tourniquet was applied to the left thigh, and left leg was sterilely prepped and draped in a standard fashion.  The leg was exsanguinated with the tourniquet inflated to 350 mmHg.  An anterior midline incision was then made from four fingerbreadths above the patella to just medial of the tibial tubercle, for a total length of approximately 8 inches.  Skin flaps were elevated medially and laterally with electrocautery used for hemostasis. Medial parapatellar arthrotomy was then performed with eversion of the patella, and excision approximately 1/2 the fat pad.  The knee was flexed up and the remnants of the medial and lateral menisci were then excised, as were the ACL and PCL.  We the used a drill to gain access to the intramedullary canal of the femur.  Intramedullary guide was then placed into the femoral canal, and a 5 degree valgus cut was then made from the distal femur utilizing the oscillating saw.  We then measured the distal femur and the size 7 had the best combined AP and lateral fit.  We then placed the size 7 Chamfer cutting guide, and made the anterior, posterior, and Chamfer cuts in the distal femur, maintaining appropriate rotation of the implant.  We then placed the trochlear cutting guide and cut the trochlear groove, as well as the box cut for the Kiel on the tibial implant.  A trial size 7 femur was then placed showing good fit.  We then removed the trial implant and exposed the proximal tibia with McKale retractors.  We removed the tibial eminences, and then gained access into the tibial canal and intramedullary guide was then placed.  We made a 5 degree posterior slope cut on the proximal tibia, removing 2 mm from the defect on the medial tibial plateau.  Trial reduction was then  performed with femoral and tibial implants, and the knee was taken through a range of motion showing good soft tissue balancing.  I should mention that we did make a significant medial release off the proximal tibia which allowed correction of the varus deformity.  After the trial reductions were performed, we determined the appropriate rotation of the tibial tray, and this was then marked.  The knee was then flexed up, and the guide for the kiel cutting broach was then placed, and we broached for the tibial kiel up to the appropriate size.  The kiel punch was then removed.  Antibiotic solution was introduced into the knee joint.  We returned our attention to the patella where the peripheral osteophytes were removed with the rongeur and bone cutter.  A 26 mm recessed patella was felt to be the best size, so we used the patellar reamer to drill to a 10 mm depth.  We then drilled the stabilizing holes for the patellar button.  Placed the trial implant, and trimmed the residual bone and peripheral osteophytes from the patella to a smooth contour.  At this point, the knee joint was then pulsatively lavaged and meticulously debrided.  I should mention that there was significant counter calcinosis of the synovium, and we performed an extensive synovectomy.  The cement was then mixed, and at the appropriate consistency the implants were cemented into position beginning with the tibia and then the femur, and then the patella.  Meticulous debridement of all extra cement was performed.  Once this cement had appropriately hardened, again took the knee through a range of motion.  We removed the tibial tray and carefully inspected the posterior compartments and removed residual debris.  I should mention that we used osteotomes to remove osteophytes from the posterior aspect of the femoral condyles prior to placement of the implants, and also removed several oseal cartilaginous loose bodies from the  posterior aspect of the joint.  These areas were carefully inspected prior to placement of the final implants.  We placed both a size 10  and 12 mm thick polyethylene insert trial, and the 10 had the best soft tissue fit.  We then meticulously cleaned the tibial tray and placed a final 10 mm insert.  Again, the knee was taken through a range of motion.  The patella showed excellent tracking and a lateral release was not required.  We brought out two Hemovac drains laterally.  The wound was then closed with a running #1 Vicryl for the synovium, interrupted figure-of-eight #1 Vicryl for the medial parapatellar arthrotomy, and 2-0 Vicryl for the subcutaneous.  Staples used to close the skin.  Adaptic and dry dressing was then placed over the knee.  The leg was wrapped with ______.  We did place 30 cc of 0.50% Marcaine with epinephrine into the knee joint and through the drain at the end of the case. Leg was wrapped with an Ace bandage from foot to thigh.  Knee immobilizer was placed.  Plan was to remove the clamp from the Hemovac in the recovery  room. The patient was then transferred to the recovery room in stable condition. Dictated by:   Vania Rea. Supple, M.D. Attending Physician:  Cain Sieve DD:  02/08/01 TD:  02/08/01 Job: [id number]

## 2010-08-13 NOTE — H&P (Signed)
[place]  Patient:    Hunter Choi, Hunter Choi Visit Number: [id number] MRN: [id number]          Service Type: Attending:  Vania Rea. Supple, M.D. Dictated by:   Alexzandrew L. Perkins, P.A.-C. Adm. Date:  02/08/01   CC:         Theressa Millard, M.D.  Di Kindle. Edilia Bo, M.D.   History and Physical  DATE OF OFFICE VISIT AND HISTORY AND PHYSICAL:  02/01/01  CHIEF COMPLAINT:  Left knee pain.  HISTORY OF PRESENT ILLNESS:  The patient is a 77 year old male who has been seen and evaluated by Vania Rea. Supple, M.D., for left knee pain.  He has a longstanding history of with underlying arthrosis concerning his left knee. He has previously undergone a right total knee arthroplasty approximately 10 years ago per Maisie Fus L. Presson, M.D., and has been doing extremely well with this.  His left knee has deteriorated progressively to the point where it has started to interfere with his daily activities.  He is seen in the office and x-rays show complete obliteration of the medial joint space with bone-on-bone contact and also patellofemoral arthrosis is noted.  He has done so well with his right knee that he comes in for evaluation to be considered for a left knee replacement.  It is felt that he is an appropriate candidate for knee replacement.  The risks and benefits of this procedure have been discussed with him at length and he has elected to proceed with surgery.  ALLERGIES:  No known drug allergies.  INTOLERANCES:  CODEINE causes indigestion.  CURRENT MEDICATIONS: 1. Aspirin 325 mg daily.  Stop prior to surgery. 2. Lipitor 10 mg daily. 3. Hydrochlorothiazide 25 mg daily. 4. Diovan 80 mg daily.  PAST MEDICAL HISTORY:  Hypertension and rheumatoid arthritis.  He has three minor aneurysms.  Heart murmur, history of minor stroke, coronary arterial disease, and carotid arterial disease with a complete blockage of the left carotid, as per patient.  PAST  SURGICAL HISTORY:  Right total knee replacement arthroplasty 10 years ago per Maisie Fus L. Meade Maw, M.D.  He has undergone coronary artery bypass grafting of four vessels.  He has also undergone bilateral feet surgery.  Also a right carotid endarterectomy.  SOCIAL HISTORY:  He is married and has two sons.  Previous history of smoking, however, quit many years ago.  He has not had any alcohol intake for two to three years.  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  FAMILY HISTORY:  Father deceased at age 33 secondary to post surgical complications.  He also had an aneurysm.  Mother deceased at age 50 with a stroke and heart attack.  REVIEW OF SYSTEMS:  General:  No fever, chills, or night sweats.  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  He does wear glasses.  No seizures, syncope, or paralysis.  Respiratory:  No shortness of breath, productive cough, or hemoptysis.  Cardiovascular:  No chest pain, angina, or orthopnea.  GI:  No nausea, vomiting, diarrhea, or constipation. GU:  No dysuria, hematuria, or discharge.  Musculoskeletal:  Pertinent to that of the left knee found in the history of present illness.  PHYSICAL EXAMINATION:  VITAL SIGNS:  Pulse 64, respirations 12, blood pressure 170/88.  GENERAL APPEARANCE:  The patient is a 77 year old white male, well nourished, well developed, and appears to be in no acute distress.  He is alert, oriented, cooperative, and very pleasant at the  time of exam.  He appears to be a good historian.  HEENT:  Normocephalic and atraumatic.  EOMs are intact.  The oropharynx is clear.  The right eye is round and reactive.  NECK:  Supple.  Right carotid bruit noted with a trace left bruit.  CHEST:  Clear to auscultation in the anterior and posterior chest walls.  No rhonchi or rales are noted.  HEART:  Regular rate and rhythm with a crescendo/decrescendo-type systolic ejection murmur graded  3/6.  ABDOMEN:  Soft and nontender.  Bowel sounds are present.  No rebound or guarding.  RECTAL:  Not done, not pertinent to present illness.  BREASTS:  Not done, not pertinent to present illness.  GENITALIA:  Not done; not pertinent to present illness.  EXTREMITIES:  Significant to that of the left lower extremity.  The patient has moderate tenderness noted over the mediolateral joint line.  Motor function is intact.  Moving the foot and toes well on exam.  Crepitus noted on passive range of motion.  He does have slight varus alignment.  Motion of the knee is from 10 degrees lacking of full extension up to 125 degrees of flexion.  Mild knee effusion.  IMPRESSION:  1. Left knee degenerative joint disease.  2. Hypertension.  3. Rheumatoid arthritis.  4. History of aneurysms x 3.  5. Heart murmur.  6. History of left cerebrovascular accident with left eye blindness.  7. Carotid arterial disease with complete blockage of the left.  8. Coronary artery disease.  9. Status post right total knee replacement arthroplasty 10 years ago. 10. Status post coronary artery bypass grafting. 11. Status post right carotid endarterectomy. 12. Status post bilateral feet surgery.  PLAN:  The patient will be admitted to [facility]. [facility] and will undergo left total knee replacement arthroplasty.  His medical physician is Theressa Millard, M.D.  He will be notified of the room number on admission and will be consulted for medical assistance with this patient throughout the hospital course.  His cardiovascular surgeon is Di Kindle. Edilia Bo, M.D. r. Edilia Bo will be notified and consulted if needed for any assistance with this patient during the hospital course. Dictated by:   Alexzandrew L. Perkins, P.A.-C. Attending:  Vania Rea. Supple, M.D. DD:  02/07/01 TD:  02/07/01 Job: [id number]

## 2010-08-13 NOTE — Cardiovascular Report (Signed)
NAME:  Hunter Choi, Hunter Choi NO.:  [id number]   MEDICAL RECORD NO.:  [id number]                   PATIENT TYPE:  INP   LOCATION:  6533                                 FACILITY:  [facility]   PHYSICIAN:  Lesleigh Noe, M.D.            DATE OF BIRTH:  1927-05-27   DATE OF PROCEDURE:  09/27/2002  DATE OF DISCHARGE:  09/28/2002                              CARDIAC CATHETERIZATION   INDICATIONS FOR PROCEDURE:  Unstable angina with high grade lesion in the  LAD distal to the LIMA graft insertion site.   PROCEDURE PERFORMED:  Percutaneous transluminal coronary angioplasty and  drug-eluting stent in the distal LAD via the left internal mammary artery  graft.   DATE OF PROCEDURE:  September 27, 2002.   DESCRIPTION:  After informed consent, a 6-French sheath was started in the  left femoral artery using modified Seldinger technique.  A 6-French mammary  guide catheter was used to obtain guiding shots.  This catheter never  directly engaged the mammary, but could be aimed towards the ostium and had  fairly stable position.  The patient was enrolled in the ACUITY study and  had been receiving IV Integrelin and low dose IV Angiomax infusion.  A re  bolus with Angiomax in the cath lab was performed.  ACT was documented to be  212 seconds prior to this bolus.  Percutaneous intervention was then  performed using a BMW wire through the 6 [place] internal mammary catheter.  We predilated with a 15 mm x 2.5 mm Maverick balloon and then deployed a 16  mm long Taxus 2.5 mm diameter stent to 14 atmospheres.  Two balloon  inflations were performed.  The final angiograpic result was quite  acceptable although the post deployment angiographic appearance did not  result in 0% stenosis.  There was residual less than 10% narrowing.  Antegrade flow was TIMI-3 following intramammary nitroglycerin.  Equipment  was removed.  TIMI-3 flow was documented and the case was terminated.   CONCLUSION:  Successful percutaneous coronary intervention on the distal LAD  via the left internal mammary graft with reduction in stenosis from 99% to  0% following Taxus stent deployment.    PLAN:  Aspirin and Plavix.  Plavix will be continued for six months. The  patient was started on Plavix on September 26, 2002.                                               Lesleigh Noe, M.D.    HWS/MEDQ  D:  09/27/2002  T:  09/29/2002  Job:  [id number]  Theressa Millard, M.D.  [address] [place]  [place] [address]  Fax: [phone]   cc:   Theressa Millard, M.D.  [address] [place]  [place] [address]  Fax: [phone]

## 2010-08-13 NOTE — Op Note (Signed)
Hunter Choi, Hunter Choi                ACCOUNT NO.:  [id number]   MEDICAL RECORD NO.:  [id number]          PATIENT TYPE:  AMB   LOCATION:  DSC                          FACILITY:  [facility]   PHYSICIAN:  Lubertha Basque. Dalldorf, M.D.DATE OF BIRTH:  Apr 29, 1927   DATE OF PROCEDURE:  01/04/2005  DATE OF DISCHARGE:                                 OPERATIVE REPORT   PREOPERATIVE DIAGNOSES:  1.  Right forearm nodule.  2.  Right elbow olecranon bursitis.   POSTOPERATIVE DIAGNOSES:  1.  Right forearm nodule.  2.  Right elbow olecranon bursitis.   OPERATION/PROCEDURE:  1.  Excision nodules x2, right forearm.  2.  Excision right olecranon bursa.   ANESTHESIA:  General.   SURGEON:  Lubertha Basque. Jerl Santos, M.D.   ASSISTANT:  Lindwood Qua, P.A.-C.   INDICATIONS FOR PROCEDURE:  The patient is a 77 year old man with rheumatoid  arthritis.  He has had many nodules excised in the past.  We attempted a  nodule excision on the right arm along with left arm procedures a couple of  months ago.  The left-sided procedures have all been successful but on the  right he has experienced one recurrent nodule and one new nodule as well as  continued olecranon bursal swelling.  He is offered excision of these  nodules and the olecranon bursa.  Informed operative consent was obtained  after discussing the possible complications of reaction to anesthesia,  infection, neurovascular injury and recurrence.   DESCRIPTION OF PROCEDURE:  The patient was taken to an operating suite where  general anesthesia was applied without difficulty.  He was positioned supine  and prepped and draped in the normal sterile fashion.  After administration  of IV antibiotics, the right arm was elevated, exsanguinated and a  tourniquet inflated about the upper arm.  We utilized his old incision,  about a mid forearm lesion with dissection down to a 2 x 1 x 1 cm firm  nodule.  This was excised and sent to pathology.  The wound was irrigated  followed by reapproximation of subcutaneous tissues with 2-0 undyed Vicryl  and skin closure with nylon.   A second nodule closer to the elbow was approached in similar fashion  through a separate incision.  This nodule was slightly smaller but of  similar substance.  This also was sent to pathology.  This wound was closed  in a similar fashion.   We then utilized his old incision across the olecranon bursa.  I have  removed the entire olecranon bursa but did not sent this to pathology.  He  had a small olecranon spur which I removed with a rongeur.  We then  irrigated and were very careful to tack down the skin directly to the soft  tissues below and in hopes of eliminating dead space and preventing  recurrence of this bursitis.  I placed a Penrose drain as well and we closed  the skin with nylon.  The tourniquet was deflated and the hand became pink  and warm medially.  A small amount of bleeding was easily controlled with  Bovie  cautery and some pressure.  We injected Marcaine about the skin edges  and then applied a compressive dressing with a loose Ace wrap.  Estimated  blood loss and fluids may obtained from anesthesia records, as can accurate  tourniquet time.   DISPOSITION:  The patient was extubated in the operating room and taken to  the recovery room in stable condition.   PLAN:  Plans were for him to go home the same day and to follow up in the  office in less than a week.  I will contact him by phone tonight.      Lubertha Basque Jerl Santos, M.D.  Electronically Signed     PGD/MEDQ  D:  01/04/2005  T:  01/04/2005  Job:  [id number]

## 2010-08-13 NOTE — Discharge Summary (Signed)
[facility]  Patient:    Hunter Choi, Hunter Choi Visit Number: [id number] MRN: [id number]          Service Type: SUR Location: [id number] Attending Physician:  Cain Sieve Dictated by:   Dorie Rank, P.A. Admit Date:  02/08/2001 Discharge Date: 02/12/2001                             Discharge Summary  ADDENDUM   LABORATORY DATA AND X-RAY FINDINGS:  Hemoglobin and hematocrit on February 06, 2001, was 13.8 and 41.1 respectively.  On February 09, 2001, hemoglobin 9.4 and hematocrit 28.1.  On February 11, 2001, hemoglobin 9.1, hematocrit 26.8. Coagulation studies on February 06, 2001, were within normal limits.  On February 10, 2001, PT 17.0, INR 1.5.  On February 12, 2001, PT 18.7, INR 1.8. BMET on February 06, 2001, revealed sodium 129, chloride 95, otherwise normal. On February 10, 2001, sodium 124, chloride 92.  On February 12, 2001, sodium 123, chloride 87, glucose 129, otherwise within normal limits.  Liver function studies on February 06, 2001, within normal limits.  UA on February 06, 2001, negative.  Blood type on February 06, 2001, B positive.  Two-view chest x-ray on January 29, 1998, showed no evidence of pneumothorax following chest tube removal.  Increased right lower lobe atelectasis with left lobe atelectasis without change.  Small bilateral pleural effusions.  EKG tracings from February 06, 2001, were normal sinus rhythm with no significant change since January 26, 1998, confirmed by Dr. Olga Millers.Dictated by: Dorie Rank, P.A. Attending Physician:  Cain Sieve DD:  03/14/01 TD:  03/15/01 Job: [id number]

## 2010-08-13 NOTE — H&P (Signed)
NAME:  Hunter Choi NO.:  [id number]   MEDICAL RECORD NO.:  [id number]                   PATIENT TYPE:  INP   LOCATION:  1823                                 FACILITY:  [facility]   PHYSICIAN:  Meade Maw, M.D.                 DATE OF BIRTH:  1927/05/06   DATE OF ADMISSION:  09/24/2002  DATE OF DISCHARGE:                                HISTORY & PHYSICAL   REASON FOR ADMISSION:  Unstable angina followed with cardiac enzymes.   HISTORY OF PRESENT ILLNESS:  Hunter Choi is a very pleasant 77 year old gentleman  with known coronary artery disease, status post coronary artery bypass  surgery in November 1999.  Exact anatomy is unknown. Hunter Choi has also had a right  carotid endarterectomy in 1996. Hunter Choi presents to the emergency room with  complaints of uneasiness in his chest. This is a similar type of uneasiness  prior to his coronary artery bypass surgery. His first episode actually  occurred on the night of September 23, 2002. This persisted for approximately one  hour and was associated with dyspnea. The chest pain resolved spontaneously.   Hunter Choi then presumed with his regular activities on September 24, 2002;  climbed a flight of stairs, mowed his lawn, took a shower. Following the  shower, Hunter Choi again experienced the uneasiness. It continued to persist and Hunter Choi  presented to the emergency room for further evaluation. In the emergency  room, Hunter Choi was treated with Morphine, aspirin, and nitrates for resolution of  his chest pain.   Hunter Choi states that Hunter Choi has been compliant with his current medical  regimen. Hunter Choi has been followed by Dr. Earl Gala. His last visit with Dr. Verdis Prime was more than three years prior. Hunter Choi did not keep his scheduled  appointment for his stress test prior to his knee replacement. Hunter Choi has  stopped smoking since 1961. No alcohol use in the last four years. Hunter Choi  continues to be treated for hypertension and dyslipidemia. Hunter Choi has noted no  aggravating or  alleviating factors for his chest pain.   PAST MEDICAL HISTORY:  Significant for coronary artery disease,  hypertension, rheumatoid arthritis, heart murmur, history of minor stroke,  coronary artery disease and carotid arterial disease as noted above.   PAST SURGICAL HISTORY:  1. Coronary artery bypass surgery.  2. Right carotid endarterectomy.  3. Right total knee replacement in November 2002.  4. Bilateral feet surgery.   CURRENT MEDICATIONS:  1. Aspirin 325 mg daily.  2. Lipitor 10 mg daily.  3. HCTZ 25 mg daily.  4. Diovan 80 mg daily.   ALLERGIES:  No known drug allergies. Hunter Choi is intolerant to CODEINE which  results in nausea.   SOCIAL HISTORY:  Hunter Choi is married. Hunter Choi has two sons. Hunter Choi stopped smoking many  years prior. No alcohol intake for the last four years. 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   FAMILY HISTORY:  Father passed at age 49 secondary to surgical  complications. Mother passed at age 32 with MI and heart attack.   REVIEW OF SYSTEMS:  Hunter Choi denies black tarry stools. Hunter Choi does note that Hunter Choi has  had some bright red blood on his tissue paper. Hunter Choi attributes this to  hemorrhoids. Hunter Choi has recently had a rash over his left back, which has been  significantly painful. Review of systems is, otherwise, negative.   PHYSICAL EXAMINATION:  GENERAL:  This is an elderly male in no acute  distress. Hunter Choi is currently pain-free.  VITAL SIGNS:  Blood pressure 154/70, heart rate 90, respiratory rate 20, O2  saturation 95%.  HEENT:  Unremarkable.  NECK:  There is the presence of a carotid bruit over both right and left  carotids. There is a surgical scar over the right neck. There is no neck  vein distention.  PULMONARY:  Reveals breath sounds which are equal and clear to auscultation.  No crackles are noted.  CARDIOVASCULAR:  Regular rate and rhythm. There is a 3/6 systolic murmur,  most noted at the left upper sternal border. His PMI is nondisplaced.   ABDOMEN:  Soft. Nontender. Normal bowel sounds.  EXTREMITIES:  No peripheral edema. Distal pulses are equal and palpable.  Femoral pulses are 2+ and equal.  SKIN:  Reveals the presence of a rash over his left back over approximately  the T8 dermatome.  NEUROLOGICAL:  Nonfocal.   LABORATORY DATA:  Sodium 119, potassium 3.5, creatinine 1.1. Hemoglobin 7.7,  hematocrit 37, platelet count 271,000.  CK 224, MB 8.1, Troponin-I 0.01.  Hematocrit 44.   His EKG reveals a normal sinus rhythm with intraventricular conduction  delay. There is no significant change since his last tracing.   Chest x-ray is not acute.   IMPRESSION:  1. Unstable angina. Borderline positive enzymes. Hunter Choi will be admitted to     telemetry. Left heart catheterization will be planned for September 25, 2002.     Hunter Choi currently is pain-free following his treatment in the emergency room.     Will continue with intravenous heparin. Hunter Choi will need to be monitored     closely for blood in the stool. Serial cardiac enzymes will be obtained.  2. Hypertension, borderline control. Will continue with Diovan 80 mg daily.     Will add Toprol 25 mg daily.  3. Hyponatremia, most likely related to hydrochlorothiazide and increased     fluid intake. Hunter Choi will be switched to a loop diuretic and free water     restriction.  4. Hypokalemia. Will add potassium supplements.  5. Herpes zoster over approximately T8 dermatome. Will initiate Zovirax 800     mg p.o. five times a day. It is recognized that it is more than 48 hours.     However, it is an attempt to prevent postherpetic neuralgia.                                               Meade Maw, M.D.    HP/MEDQ  D:  09/24/2002  T:  09/24/2002  Job:  [id number]   cc:   Lesleigh Noe, M.D.  [address]  [place]  [place] [address]  Fax: [phone]   Theressa Millard, M.D.  [address]  [place]  [place]  [address]  Fax: [phone]   cc:   Lesleigh Noe, M.D.  [address]  [place]  [place] [address]  Fax: [phone]   Theressa Millard, M.D.  [address] [place]  [place] [address]  Fax: [phone]

## 2010-08-13 NOTE — Consult Note (Signed)
   NAME:  Hunter Choi, Hunter Choi NO.:  [id number]   MEDICAL RECORD NO.:  [id number]                   PATIENT TYPE:  INP   LOCATION:  6533                                 FACILITY:  [facility]   PHYSICIAN:  Georgann Housekeeper, M.D.                 DATE OF BIRTH:  04-29-1927   DATE OF CONSULTATION:  09/28/2002  DATE OF DISCHARGE:                                   CONSULTATION   This is a 77 year old male admitted by cardiology service, Dr. Katrinka Choi, for a  cardiac catheterization and PTC and stent.  I was consulted with his known  coronary artery disease, hypertension, hyperlipidemia and osteoarthritis and  mild hyponatremia.  Blood sugar was found to be 208 this morning.  The  patient had no history of diabetes in the past and the blood sugar was drawn  at 9 a.m.  His sodium was 123, potassium stable.   PAST MEDICAL HISTORY:  As above.   MEDICATIONS:  1. Hydrochlorothiazide 25 mg daily.  2. Diovan 180 mg daily.  3. Lipitor.  4. Plavix.  5. Aspirin.   LABORATORY DATA:  Significant for urinalysis negative for any glucose or  ketones.  Blood sugar on September 25, 2002 was 103, on September 26, 2002 was 117, on  September 28, 2002 was 208.  Sodium is stable at 123.  Normal renal function and  potassium.  Normal white count.  Sodium 3.7.   IMPRESSION:  Hyperglycemia with blood sugar of 200.  No history of diabetes.  Probably related to stress or intravenous fluids with normal blood sugar in  the past.  Urine negative. No symptoms.   PLAN:  I will check an A1C, repeat a fasting blood sugar in the office next  week and follow after that.  As far as mild hyponatremia, chronic, probably  is due to the hydrochlorothiazide; he has been on this medication for years.  I will leave it up to Dr. Katrinka Choi and Hunter Choi to possibly follow the sodium as  it is dropping; might consider changing the hydrochlorothiazide.  He is  medically okay to discharge and follow up with Dr. Earl Choi as an  outpatient.                                                Georgann Housekeeper, M.D.    [name]  D:  09/28/2002  T:  09/30/2002  Job:  [id number]

## 2010-08-13 NOTE — Cardiovascular Report (Signed)
NAME:  Hunter Choi, KAWAI NO.:  [id number]   MEDICAL RECORD NO.:  [id number]                   PATIENT TYPE:  INP   LOCATION:  3743                                 FACILITY:  [facility]   PHYSICIAN:  Lesleigh Noe, M.D.            DATE OF BIRTH:  Dec 17, 1927   DATE OF PROCEDURE:  DATE OF DISCHARGE:                              CARDIAC CATHETERIZATION   INDICATION FOR PROCEDURE:  Unstable angina in this patient who is nine years  status post coronary artery bypass grafting by Gwenith Daily. Tyrone Sage, M.D.  At  that time he received left internal mammary artery graft to the LAD,  saphenous vein graft to the first obtuse marginal, saphenous vein graft to  the diagonal, saphenous vein graft to the distal right coronary and LIMA to  the LAD.   PROCEDURES:  1. Left heart catheterization.  2. Selective coronary angiography.  3. Left ventriculography.  4. Internal mammary graft angiography.  5. Saphenous vein graft angiography.   DESCRIPTION OF PROCEDURE:  After informed consent, a 6 French sheath was  placed in the right femoral artery using the modified Seldinger technique.  A 6 French A2 multipurpose catheter was used for hemodynamic recordings,  left ventriculography, and selective left and right coronary angiography and  saphenous vein graft angiography.  We used the internal mammary artery  catheter for right coronary angiography and also for internal mammary artery  angiography.  We also attempted to directly cannulate the internal mammary  artery on the left but were unable to do this with an IMA guide catheter and  a Sherpa guide catheter.  The patient was enrolled in the ACUITY trial and  was on Integrilin and Angiomax.  The procedure was terminated after it  became apparent that we would have difficulty with guide catheter intubation  of the internal mammary artery.  We will review the pictures and come up  with a game plan to approach this lesion.   RESULTS:   HEMODYNAMIC DATA:  1. Aortic pressure 171/84.  2. Left ventricular pressure 189/12.   LEFT VENTRICULOGRAPHY:  The left ventricle was normal in size and  demonstrates normal contractility.  EF 60.   SELECTIVE CORONARY ANGIOGRAPHY:  1. Left main coronary:  The left main contains a mid- to distal 90%     stenosis.  2. Left anterior descending coronary artery:  The LAD contains a 75% mid     vessel stenosis.  Antegrade flow is visible, but competitive flow is     noted due to saphenous vein graft in the obtuse marginal and the LIMA to     the LAD.  The second diagonal contains a 90% stenosis.  3. Circumflex artery:  100% occluded proximally.  4. Right coronary:  100% distally.  Two large acute marginal branches arise     from the right coronary artery.  The right coronary contains a 60%  proximal stenosis.  The PDA fills by left-to-right collaterals.    BYPASS GRAFT ANGIOGRAPHY:  1. Saphenous vein graft to the right coronary artery:  Totally occluded.  2. Saphenous vein graft  to the obtuse marginal:  This graft is widely     patent.  The obtuse marginal proximal to the graft insertion site     contains an 80% stenosis.  This impedes flow into a small branch of this     obtuse marginal that arises more proximally.  3. Saphenous vein graft to the diagonal:  Widely patent.  The ostium of the     diagonal contains a 70% stenosis.  The graft contains luminal     irregularities.  4. Left internal mammary artery to the LAD:  The graft is widely patent.     There was much difficulty with trying to directly intubate the ostium of     the internal mammary.  We were unable to do this with LIMA catheter or     with a shepherd's crook catheter.  The catheter tips would simply not     reach the ostium of the internal mammary.  We were able to visualize this     vessel and demonstrated that it was widely patent but there was high-     grade 99% obstruction in the LAD approximately  10-15 mm beyond the graft     insertion site.   CONCLUSION:  1. Severe native vessel coronary disease with total occlusion of the distal     right coronary artery, total occlusion of the proximal circumflex, high-     grade mid-left main, and significant left anterior descending beyond the     left internal mammary artery graft insertion site.  The second diagonal     also contains 90% stenosis.  2. Bypass graft failure with total occlusion of the graft of the right     coronary with a patent saphenous vein graft to the obtuse marginal and     diagonal.  3. Patent left internal mammary artery to the left anterior descending     artery with significant progression of the disease in the distal left     anterior descending beyond the left internal mammary artery graft     insertion site as described above.  4. Normal left ventricular function.   PLAN:  Review cineangiograms.  Consider percutaneous intervention either via  the native circulation or via the internal mammary, perhaps from the left  brachial approach, or from the groin approach using a different catheter  configuration.                                               Lesleigh Noe, M.D.    HWS/MEDQ  D:  09/25/2002  T:  09/26/2002  Job:  [id number]  Theressa Millard, M.D.  [address] [place]  [place] [address]  Fax: [phone]   cc:   Theressa Millard, M.D.  [address] [place]  [place] [address]  Fax: [phone]

## 2010-11-09 ENCOUNTER — Encounter (HOSPITAL_COMMUNITY): Payer: Medicare Other

## 2010-11-09 ENCOUNTER — Other Ambulatory Visit: Payer: Self-pay | Admitting: Urology

## 2010-11-09 LAB — CBC
HCT: 38.8 % — ABNORMAL LOW (ref 39.0–52.0)
Hemoglobin: 12.5 g/dL — ABNORMAL LOW (ref 13.0–17.0)
MCH: 26.8 pg (ref 26.0–34.0)
MCHC: 32.2 g/dL (ref 30.0–36.0)
MCV: 83.3 fL (ref 78.0–100.0)
Platelets: 224 10*3/uL (ref 150–400)
RBC: 4.66 MIL/uL (ref 4.22–5.81)
RDW: 17.8 % — ABNORMAL HIGH (ref 11.5–15.5)
WBC: 10 10*3/uL (ref 4.0–10.5)

## 2010-11-09 LAB — BASIC METABOLIC PANEL
BUN: 19 mg/dL (ref 6–23)
CO2: 24 mEq/L (ref 19–32)
Calcium: 9.7 mg/dL (ref 8.4–10.5)
Chloride: 92 mEq/L — ABNORMAL LOW (ref 96–112)
Creatinine, Ser: 1.29 mg/dL (ref 0.50–1.35)
GFR calc Af Amer: >60 mL/min (ref >60)
GFR calc non Af Amer: 53 mL/min — ABNORMAL LOW (ref >60)
Glucose, Bld: 136 mg/dL — ABNORMAL HIGH (ref 70–99)
Potassium: 4.5 mEq/L (ref 3.5–5.1)
Sodium: 128 mEq/L — ABNORMAL LOW (ref 135–145)

## 2010-11-09 LAB — SURGICAL PCR SCREEN
MRSA, PCR: NEGATIVE
Staphylococcus aureus: POSITIVE — AB

## 2010-11-17 ENCOUNTER — Other Ambulatory Visit: Payer: Self-pay | Admitting: Urology

## 2010-11-17 ENCOUNTER — Ambulatory Visit (HOSPITAL_COMMUNITY)
Admission: RE | Admit: 2010-11-17 | Discharge: 2010-11-18 | Disposition: A | Discharge status: Home / Self Care | Payer: Medicare Other | Source: Ambulatory Visit | Attending: Urology | Admitting: Urology

## 2010-11-17 DIAGNOSIS — K219 Gastro-esophageal reflux disease without esophagitis: Secondary | ICD-10-CM | POA: Insufficient documentation

## 2010-11-17 DIAGNOSIS — Z8673 Personal history of transient ischemic attack (TIA), and cerebral infarction without residual deficits: Secondary | ICD-10-CM | POA: Insufficient documentation

## 2010-11-17 DIAGNOSIS — E039 Hypothyroidism, unspecified: Secondary | ICD-10-CM | POA: Insufficient documentation

## 2010-11-17 DIAGNOSIS — M069 Rheumatoid arthritis, unspecified: Secondary | ICD-10-CM | POA: Insufficient documentation

## 2010-11-17 DIAGNOSIS — I10 Essential (primary) hypertension: Secondary | ICD-10-CM | POA: Insufficient documentation

## 2010-11-17 DIAGNOSIS — Z79899 Other long term (current) drug therapy: Secondary | ICD-10-CM | POA: Insufficient documentation

## 2010-11-17 DIAGNOSIS — IMO0002 Reserved for concepts with insufficient information to code with codable children: Secondary | ICD-10-CM | POA: Insufficient documentation

## 2010-11-17 DIAGNOSIS — I2510 Atherosclerotic heart disease of native coronary artery without angina pectoris: Secondary | ICD-10-CM | POA: Insufficient documentation

## 2010-11-17 DIAGNOSIS — N138 Other obstructive and reflux uropathy: Secondary | ICD-10-CM | POA: Insufficient documentation

## 2010-11-17 DIAGNOSIS — Z951 Presence of aortocoronary bypass graft: Secondary | ICD-10-CM | POA: Insufficient documentation

## 2010-11-17 DIAGNOSIS — N320 Bladder-neck obstruction: Secondary | ICD-10-CM | POA: Insufficient documentation

## 2010-11-17 DIAGNOSIS — N401 Enlarged prostate with lower urinary tract symptoms: Secondary | ICD-10-CM | POA: Insufficient documentation

## 2010-11-19 NOTE — Op Note (Signed)
NAMENOLEN, LINDAMOOD NO.:  [id number]  MEDICAL RECORD NO.:  [id number]  LOCATION:  1405                         FACILITY:  [facility]  PHYSICIAN:  Bertram Millard. Chereese Cilento, M.D.DATE OF BIRTH:  [birth year]  DATE OF PROCEDURE:  11/17/2010 DATE OF DISCHARGE:                              OPERATIVE REPORT   PREOPERATIVE DIAGNOSIS:  Benign prostatic hyperplasia with significant voiding symptomatology.  POSTOPERATIVE DIAGNOSIS:  Benign prostatic hyperplasia with significant voiding symptomatology.  PRINCIPAL PROCEDURE:  Transurethral resection of prostate.  SURGEON:  Bertram Millard. Jadarion Halbig, MD  ANESTHESIA:  General with LMA.  COMPLICATIONS:  None.  DRAINS:  22-French three-way Foley to bedside bag.  SPECIMENS:  Prostate chips to Pathology.  BRIEF HISTORY:  An 77 year old male with significant voiding symptomatology due to outlet obstruction.  The patient has been on maximal medical therapy, but cannot tolerate Avodart.  Because of his significant symptomatology, he requests further management.  We have talked about TURP and he presents at this time for TURP with the Gyrus device.  He is aware of risks and complications and desires to proceed.  DESCRIPTION OF PROCEDURE:  The patient was identified in the holding area.  He received preoperative IV antibiotics.  He was taken to the operating room where general anesthetic was administered using LMA.  He was placed in the dorsal lithotomy position.  Genitalia and perineum were prepped and draped.  Time-out was then performed.  I then dilated his urethral meatus to 28-French with Sissy Hoff sounds. Resectoscope sheath was then placed, with the resectoscope element eventually placed with the Gyrus loop.  Inspection of the bladder revealed significant trabeculation and cellule formation.  There were no tumors or foreign bodies.  Ureteral orifices were quite close to the bladder neck.  The bladder neck was very high riding.   There was minimal obstruction from somewhat small lateral lobes.  I then began resection on the left lateral lobe, starting anteriorly at 12 o'clock position, and then moving clockwise down toward the 6 o'clock position.  The entire lobe was resected down to the surgical capsule.  Apical tissue was then taken.  I then incised the bladder neck as well between the ureteral orifices.  Anterior tissue and then the right lateral lobe tissue was then resected down to the surgical capsule as well.  Apical tissue in the right was then trimmed.  Verumontanum was left in place for landmark identification.  Following resection of most of the prostatic tissue, including the posterior bladder neck, inspection revealed the ureteral orifices to be intact.  I then irrigated the chips from the bladder and then inspected the prostatic fossa which was hemostatic.  I then removed the scope and placed a 22-French three-way Foley catheter.  This was hooked to CBI with normal saline, 30 cc of water was placed in the balloon.  Chips were sent for pathology.  The patient was then awakened and taken to PACU in stable condition.     Bertram Millard. Charlestine Rookstool, M.D.     SMD/MEDQ  D:  11/17/2010  T:  11/17/2010  Job:  [id number]  cc:   Lyn Records, M.D. Fax: [phone]  Theressa Millard, M.D. Fax: [phone]  Electronically Signed by Marcine Matar M.D. on 11/19/2010 06:59:41 AM

## 2010-11-19 NOTE — H&P (Signed)
NAMERHYAN, RADLER NO.:  [id number]  MEDICAL RECORD NO.:  [id number]  LOCATION:  1405                         FACILITY:  [facility]  PHYSICIAN:  Bertram Millard. Zaylen Susman, M.D.DATE OF BIRTH:  1927/11/19  DATE OF ADMISSION:  11/17/2010 DATE OF DISCHARGE:                             HISTORY & PHYSICAL   REASON FOR ADMISSION:  BPH, reason for TURP.  BRIEF HISTORY:  Mr. Umbarger is an 77 year old male who is on maximal medical therapy for BPH but the patient has carried a large residual. He has been intolerant of Avodart, but has been on Flomax.  Because of his significant voiding symptomatology despite medical therapy and the fact that he carries elevated urinary residuals, it was recommended that he undergo TURP.  Risks and complications of the procedure have been discussed with the patient.  These include, but are not limited to, infection, bleeding, persistent retention, cardiovascular events and anesthetic complications.  He understands these and desires to proceed.  PAST MEDICAL HISTORY:  Significant for GERD, coronary artery disease, hypercholesterolemia, hypertension, hypothyroidism, history of stroke in the past, rheumatoid arthritis.  He has aortic stenosis.  He has had prior ankle surgery, carotid endarterectomy, cataract surgery, prior surgery for rheumatoid arthritis, prior surgery for resection of ganglion cyst, total hip replacement, prior prostate biopsy, total knee replacement on the right, and coronary artery bypass graft.  MEDICATIONS: 1. Valium 5 mg as needed daily. 2. Norvasc 5 mg daily. 3. Metoprolol 25 mg, one-half once a day. 4. Lasix 40 mg one p.o. q.a.m. p.r.n. 5. Potassium 20 mEq one p.o. daily. 6. Synthroid 25 mcg daily. 7. Flomax 0.4 mg daily. 8. Iron tablet daily. 9. Prednisone 5 mg 4 tablets daily. 10.Percocet p.r.n. 11.Omeprazole 20 mg daily. 12.Tylenol 500 mg, 5 tablets daily. 13.Sublingual nitroglycerin p.r.n. 14.Folic acid 1 mg  daily. 15.Zocor 20 mg daily. 16.Methotrexate 15 mg once a week. 17.Aspirin 325 mg daily.  He has been off that for about a week.  ALLERGIES:  He has had some mild delirium with CIPRO in the past.  SOCIAL HISTORY:  The patient is married.  His wife had a stroke recently.  He quit cigarette smoking in 1960.  He rarely drinks.  He is retired.  FAMILY HISTORY:  Significant for coronary artery disease, cerebrovascular accident.  REVIEW OF SYSTEMS:  Significant for difficulty voiding.  He has arthritic pain.  He has no recent change in bowel habits.  He denies any abdominal pain.  He denies any chest pain or recent difficulty breathing.  PHYSICAL EXAMINATION:  GENERAL:  A pleasant elderly male.  He has stigmata of rheumatoid arthritis. HEENT:  Basically normal. NECK:  Supple.  No obvious masses. LUNGS:  Clear.  Normal inspiratory and expiratory excursion without respiratory distress. HEART:  Normal rate and rhythm. ABDOMEN:  Protuberant, soft, nondistended, nontender.  No mass, no organomegaly. GENITOURINARY:  Bladder was nonpalpable.  Phallus was circumcised. Phallus, the foreskin retracts easily.  No penile plaques, lesions or fibrotic areas.  Testicles slightly atrophic but otherwise normal. EXTREMITIES:  Without cyanosis, clubbing or edema.  PERTINENT LABORATORY AND X-RAY DATA:  Chest x-ray recently revealed no acute process. EKG revealed sinus rhythm with first-degree AV  block and an incomplete left bundle branch block. Laboratories on admission:  PCR was positive for MRSA.  BMET was normal except for sodium of 128, glucose of 136.  Hematocrit 39%, white count 10,000, platelet count 224,000.  IMPRESSION:  Benign prostatic hyperplasia, significant symptomatology despite medical therapy.  PLAN:  TURP.     Bertram Millard. Donyell Ding, M.D.     SMD/MEDQ  D:  11/17/2010  T:  11/17/2010  Job:  [id number]  cc:   Lyn Records, M.D. Fax: [phone]  Theressa Millard, M.D. Fax:  [phone]  Electronically Signed by Marcine Matar M.D. on 11/19/2010 06:59:48 AM

## 2012-02-06 ENCOUNTER — Encounter: Payer: Self-pay | Admitting: Vascular Surgery

## 2012-12-05 ENCOUNTER — Other Ambulatory Visit (HOSPITAL_COMMUNITY): Payer: Self-pay | Admitting: Interventional Cardiology

## 2012-12-05 DIAGNOSIS — I350 Nonrheumatic aortic (valve) stenosis: Secondary | ICD-10-CM

## 2012-12-11 ENCOUNTER — Ambulatory Visit (HOSPITAL_COMMUNITY)
Admission: RE | Admit: 2012-12-11 | Discharge: 2012-12-11 | Disposition: A | Discharge status: Home / Self Care | Payer: Medicare Other | Source: Ambulatory Visit | Attending: Interventional Cardiology | Admitting: Interventional Cardiology

## 2012-12-11 DIAGNOSIS — I10 Essential (primary) hypertension: Secondary | ICD-10-CM | POA: Insufficient documentation

## 2012-12-11 DIAGNOSIS — I359 Nonrheumatic aortic valve disorder, unspecified: Secondary | ICD-10-CM | POA: Insufficient documentation

## 2012-12-11 DIAGNOSIS — I350 Nonrheumatic aortic (valve) stenosis: Secondary | ICD-10-CM

## 2012-12-11 NOTE — Progress Notes (Signed)
  Echocardiogram 2D Echocardiogram has been performed.  Arvil Chaco 12/11/2012, 3:33 PM

## 2012-12-13 ENCOUNTER — Encounter: Payer: Self-pay | Admitting: Surgery

## 2012-12-13 ENCOUNTER — Institutional Professional Consult (permissible substitution) (INDEPENDENT_AMBULATORY_CARE_PROVIDER_SITE_OTHER): Payer: Medicare Other | Admitting: Surgery

## 2012-12-13 VITALS — BP 145/80 | HR 65 | Resp 16 | Ht 63.25 in | Wt 188.2 lb

## 2012-12-13 DIAGNOSIS — I714 Abdominal aortic aneurysm, without rupture: Secondary | ICD-10-CM | POA: Insufficient documentation

## 2012-12-13 DIAGNOSIS — N400 Enlarged prostate without lower urinary tract symptoms: Secondary | ICD-10-CM | POA: Insufficient documentation

## 2012-12-13 DIAGNOSIS — I5030 Unspecified diastolic (congestive) heart failure: Secondary | ICD-10-CM | POA: Insufficient documentation

## 2012-12-13 DIAGNOSIS — K219 Gastro-esophageal reflux disease without esophagitis: Secondary | ICD-10-CM | POA: Insufficient documentation

## 2012-12-13 DIAGNOSIS — E079 Disorder of thyroid, unspecified: Secondary | ICD-10-CM | POA: Insufficient documentation

## 2012-12-13 DIAGNOSIS — I341 Nonrheumatic mitral (valve) prolapse: Secondary | ICD-10-CM | POA: Insufficient documentation

## 2012-12-13 DIAGNOSIS — I723 Aneurysm of iliac artery: Secondary | ICD-10-CM | POA: Insufficient documentation

## 2012-12-13 DIAGNOSIS — H349 Unspecified retinal vascular occlusion: Secondary | ICD-10-CM | POA: Insufficient documentation

## 2012-12-13 DIAGNOSIS — I359 Nonrheumatic aortic valve disorder, unspecified: Secondary | ICD-10-CM

## 2012-12-13 DIAGNOSIS — I350 Nonrheumatic aortic (valve) stenosis: Secondary | ICD-10-CM | POA: Insufficient documentation

## 2012-12-13 NOTE — Progress Notes (Addendum)
Patient ID: Hunter Choi, male   DOB: 1927-05-29, 79 y.o.   MRN: [id number]    [facility]  MULTIDISCIPLINARY HEART VALVE CLINIC    CARDIOTHORACIC SURGERY CONSULTATION REPORT  Referring Provider is Lesleigh Noe, MD PCP is Darnelle Bos, MD  Chief Complaint  Patient presents with  . Aortic Stenosis    severe...eval for TAVR    HPI:  The patient is an 79 year old gentleman with a history of severe rheumatoid arthritis on prednisone and methotrexate, poor mobility due to this and history of bilateral TKR and left hip fracture repair who underwent CABG x 4 by Dr. Tyrone Sage on 01/26/1998. He had a LIMA to the LAD and SVG's to the OM, D1 and RCA. He developed recurrent angina in 2004 and underwent cardiac catheterization on 09/25/2002 showing a chronically occluded saphenous vein graft to the right coronary with all the other grafts patent. There was a 99% stenosis in the distal LAD which was subsequently treated on 09/27/2002 with a drug-eluting stent placed through the left internal mammary artery into the distal LAD. The patient has been followed for aortic stenosis and I evaluated the patient on 04/20/2010 for progressive shortness of breath. His echocardiogram at that time showed an aortic valve area of 1 cm and his catheterization showed a peak to peak gradient across the aortic valve of 44 mm mercury with a mean gradient of only 25 mm mercury. His ejection fraction was 55%. The left internal mammary graft and saphenous vein grafts were patent with the exception of the chronically occluded saphenous vein graft to the right coronary artery. The native right coronary had significant diffuse disease within it. I did not feel the patient was an operative candidate at that time due to his severe debilitation. I did not feel that his moderate aortic stenosis was bad enough to warrant consideration of transcatheter aortic valve replacement. The patient now reports that  over the last 6-12 months he has had progressive exertional dyspnea and fatigue that occurs with walking short distances in his house. He has had occasional episodes of chest discomfort. He has had orthopnea and has to sleep with 3 pillows. He denies dizziness and syncope. A recent echocardiogram shows progression of his aortic stenosis into the severe range with an aortic valve area of 0.43 cm, a mean gradient of 40 mm mercury, and a peak gradient of 65 mm mercury. Left ventricular ejection fraction is 50-55%.  The patient and his wife say that his mobility is significantly decreased due to his rheumatoid arthritis and previous joint replacements. He is able to walk with a cane but has now been severely restricted due to progressive exertional fatigue and dyspnea.  Past Medical History  Diagnosis Date  . Hypertension   . MVP (mitral valve prolapse)   . GERD (gastroesophageal reflux disease)   . Thyroid disease     HYPOTHYROIDISM  . Aortic stenosis, severe   . Embolism involving retinal artery   . BPH (benign prostatic hypertrophy)     Dr. Retta Diones  . Iliac artery aneurysm     Dr. Edilia Bo  . AAA (abdominal aortic aneurysm)     Dr. Ashley Royalty  . Diastolic heart failure     Past Surgical History  Procedure Laterality Date  . Left hip hemiarthropasty      AFTER HIP FX  . Coronary artery bypass graft      1999..Dr. Sheliah Plane  . Carotid endarterectomy    . Total knee  arthroplasty      right 1992   left  2002  . Biopsy prostate      2005  . Prostate surgery  2012    TURP  . Joint replacement  2007    THR  . Harvest bone graft  1982    FOR THE  ANKLE  . Skin grafts      1968    Family Hx: His father died at age 45 after abdominal aortic aneurysm repair. His mother died age 4 with a myocardial infarction. One brother died at age 40 with myocardial infarction.  History   Social History  . Marital Status: Married    Spouse Name: N/A    Number of Children: N/A  .  Years of Education: N/A   Occupational History  . retired   Social History Main Topics  . Smoking status: Quit in 1960  . Smokeless tobacco: no  . Alcohol Use: occasional  . Drug Use: no  . Sexual Activity: no   Other Topics Concern  . Not on file   Social History Narrative  . No narrative on file    Current Outpatient Prescriptions  Medication Sig Dispense Refill  . acetaminophen (TYLENOL) 500 MG tablet Take 500 mg by mouth every 6 (six) hours as needed for pain (TAKES FOUR TABS PER DAY).      Marland Kitchen amLODipine (NORVASC) 5 MG tablet Take 5 mg by mouth daily.      Marland Kitchen aspirin 325 MG EC tablet Take 325 mg by mouth daily.      . diazepam (VALIUM) 5 MG tablet Take 5 mg by mouth daily as needed for anxiety.      . folic acid (FOLVITE) 1 MG tablet Take 3 mg by mouth daily.      Marland Kitchen levothyroxine (SYNTHROID, LEVOTHROID) 25 MCG tablet Take 25 mcg by mouth. TAKES FOUR TIMES A WEEK      . methotrexate 2.5 MG tablet Take by mouth once a week. TAKES 10 TABS      . metoprolol tartrate (LOPRESSOR) 25 MG tablet Take 12.5 mg by mouth daily.      . nitroGLYCERIN (NITROSTAT) 0.4 MG SL tablet Place 0.4 mg under the tongue every 5 (five) minutes as needed for chest pain.      Marland Kitchen omeprazole (PRILOSEC) 20 MG capsule Take 20 mg by mouth daily.      . predniSONE (DELTASONE) 5 MG tablet Take 5 mg by mouth daily.      . simvastatin (ZOCOR) 20 MG tablet Take 20 mg by mouth every evening.       No current facility-administered medications for this visit.    Allergies  Allergen Reactions  . Ciprofloxacin     REACTION: mild delirium      Review of Systems:   General:  Good appetite, decrease energy, significant weight gain, no weight loss, no fever  Cardiac:  Occasional chest pain with exertion, no chest pain at rest, marked SOB with minimal exertion, some resting SOB, no PND, 3 pillow orthopnea, no palpitations, no arrhythmia, no atrial fibrillation, mild LE edema, no dizzy spells, no  syncope  Respiratory:  Exertional shortness of breath, no home oxygen, no productive cough, no dry cough, no bronchitis, no wheezing, no hemoptysis, no asthma, no pain with inspiration or cough, no sleep apnea, no CPAP at night  GI:   No difficulty swallowing, no reflux, no frequent heartburn, no hiatal hernia, no abdominal pain, no constipation, no diarrhea, no hematochezia, no hematemesis, no  melena  GU:   No dysuria,  positive frequency, no urinary tract infection, no hematuria, positive enlarged prostate followed by Dr. Retta Diones, no kidney stones, and a kidney disease  Vascular:  No pain suggestive of claudication, no pain in feet, no leg cramps, positive varicose veins, no DVT, no non-healing foot ulcer  Neuro:   No stroke, no TIA's, no seizures, no headaches, blindness one eye,  no slurred speech, no peripheral neuropathy,  no chronic pain , plus instability of gait, mild memory/cognitive dysfunction  Musculoskeletal: Positive rheumatoid arthritis, positive joint swelling, positive myalgias, positive difficulty walking, decreased mobility   Skin:   no rash, no itching, no skin infections, no pressure sores or ulcerations  Psych:   no anxiety, no depression, no nervousness, no unusual recent stress  Eyes:   positive blurry vision, no floaters, no recent vision changes, postive wears glasses or contacts  ENT:   positive hearing loss, history of  loose or painful teeth, no dentures, last saw dentist 6 months ago. Has appt in early Oct 2014.  Hematologic:  positive easy bruising, no abnormal bleeding, no clotting disorder, no frequent epistaxis  Endocrine:  borderline diabetes, does not check CBG's at home           Physical Exam:   BP 145/80  Pulse 65  Resp 16  Ht 5' 3.25" (1.607 m)  Wt 188 lb 3.2 oz (85.367 kg)  BMI 33.06 kg/m2  SpO2 97%   General:  Elderly but well-appearing  HEENT:  Unremarkable   Neck:   no JVD, no bruits, no adenopathy, transmitted murmur to both sides of  neck  Chest:   clear to auscultation, symmetrical breath sounds, no wheezes, no rhonchi   CV:   RRR, grade III/VI crescendo/decrescendo murmur heard best at the aorta,  no diastolic murmur  Abdomen:  soft, non-tender, no masses   Extremities:  warm, well-perfused, pulses palpable in feet, mile LE edema to mid calf  Rectal/GU  Deferred  Neuro:   Grossly non-focal and symmetrical throughout  Skin:   Clean and dry, no rashes, no breakdown   Diagnostic Tests:   *[place]*                *[facility]*                      [address]                     [place], [place] [address]                         [phone]   ------------------------------------------------------------ Transthoracic Echocardiography  Patient:    Arvin, Abello MR #:       [id number] Study Date: 12/11/2012 Gender:     M Age:        58 Height:     152.4cm Weight:     85.3kg BSA:        1.9m^2 Pt. Status: Room:    PERFORMING   [facility] Cardiology, Ec  ORDERING     Leia Alf, Henry  REFERRING    Veatrice Kells  SONOGRAPHER  Arvil Chaco cc:  ------------------------------------------------------------ LV EF: 50% -   55%  ------------------------------------------------------------ Indications:      Aortic stenosis 424.1.  ------------------------------------------------------------ History:   Risk factors:  Hypertension.  ------------------------------------------------------------ Study Conclusions  - Left ventricle: The cavity size was normal. Wall thickness  was increased in a pattern of moderate LVH. Systolic   function was normal. The estimated ejection fraction was   in the range of 50% to 55%. Doppler parameters are   consistent with abnormal left ventricular relaxation   (grade 1 diastolic dysfunction). - Aortic valve: Moderately calcified annulus. Severely   thickened, severely calcified leaflets. Transvalvular   velocity was increased, due to stenosis. There  was   critical stenosis. Valve area: 0.43cm^2(VTI). Valve area:   0.43cm^2 (Vmax). - Mitral valve: Calcified annulus. - Left atrium: The atrium was moderately dilated. Transthoracic echocardiography.  M-mode, complete 2D, spectral Doppler, and color Doppler.  Height:  Height: 152.4cm. Height: 60in.  Weight:  Weight: 85.3kg. Weight: 187.6lb.  Body mass index:  BMI: 36.7kg/m^2.  Body surface area:    BSA: 1.65m^2.  Blood pressure:     152/92.  Patient status:  Outpatient.  Location:  Echo laboratory.  ------------------------------------------------------------  ------------------------------------------------------------ Left ventricle:  The cavity size was normal. Wall thickness was increased in a pattern of moderate LVH. Systolic function was normal. The estimated ejection fraction was in the range of 50% to 55%. Images were inadequate for LV wall motion assessment. Doppler parameters are consistent with abnormal left ventricular relaxation (grade 1 diastolic dysfunction).  ------------------------------------------------------------ Aortic valve:   Moderately calcified annulus. Severely thickened, severely calcified leaflets.  Doppler: Transvalvular velocity was increased, due to stenosis. There was critical stenosis.    Valve area: 0.43cm^2(VTI). Indexed valve area: 0.24cm^2/m^2 (VTI). Peak velocity ratio of LVOT to aortic valve: 0.14. Valve area: 0.43cm^2 (Vmax). Indexed valve area: 0.24cm^2/m^2 (Vmax).    Mean gradient: 40mm Hg (S). Peak gradient: 65mm Hg (S).  ------------------------------------------------------------ Aorta:  The aorta was normal, not dilated, and non-diseased.   ------------------------------------------------------------ Mitral valve:   Calcified annulus.  Doppler:   Trivial regurgitation.  ------------------------------------------------------------ Left atrium:  The atrium was moderately  dilated.  ------------------------------------------------------------ Atrial septum:  Poorly visualized.  ------------------------------------------------------------ Right ventricle:  The cavity size was normal. Wall thickness was normal. Systolic function was normal.  ------------------------------------------------------------ Pulmonic valve:    The valve appears to be grossly normal.  Doppler:   Trivial regurgitation.  ------------------------------------------------------------ Tricuspid valve:   Structurally normal valve.   Leaflet separation was normal.  Doppler:  Transvalvular velocity was within the normal range.  No regurgitation.  ------------------------------------------------------------ Pulmonary artery:   Poorly visualized.  ------------------------------------------------------------ Right atrium:  The atrium was normal in size.  ------------------------------------------------------------ Pericardium:  There was no pericardial effusion.  ------------------------------------------------------------ Systemic veins: Inferior vena cava: The vessel was normal in size; the respirophasic diameter changes were in the normal range (= 50%); findings are consistent with normal central venous pressure.  ------------------------------------------------------------ Post procedure conclusions Ascending Aorta:  - The aorta was normal, not dilated, and non-diseased.  ------------------------------------------------------------  2D measurements        Normal  Doppler measurements   Normal Left ventricle                 Left ventricle LVID ED,   47.8 mm     43-52   Ea, lat    8.49 cm/s   ------ chord,                         ann, tiss PLAX                           DP LVID ES,   33.9 mm     23-38  E/Ea, lat  5.15        ------ chord,                         ann, tiss PLAX                           DP FS, chord,   29 %      >29     Ea, med    3.26 cm/s   ------ PLAX                            ann, tiss LVPW, ED     13 mm     ------  DP IVS/LVPW   1.03        <1.3    E/Ea, med  13.4        ------ ratio, ED                      ann, tiss Ventricular septum             DP IVS, ED    13.4 mm     ------  LVOT LVOT                           Peak vel,    55 cm/s   ------ Diam, S      20 mm     ------  S Area       3.14 cm^2   ------  Aortic valve Aorta                          Peak vel,   403 cm/s   ------ Root diam,   34 mm     ------  S ED                             Mean vel,   302 cm/s   ------ Left atrium                    S AP dim       45 mm     ------  VTI, S     96.5 cm     ------ AP dim     2.47 cm/m^2 <2.2    Mean         40 mm Hg  ------ index                          gradient,                                S                                Peak         65 mm Hg  ------                                gradient,  S                                Area, VTI  0.43 cm^2   ------                                Area index 0.24 cm^2/m ------                                (VTI)           ^2                                Peak vel   0.14        ------                                ratio,                                LVOT/AV                                Area, Vmax 0.43 cm^2   ------                                Area index 0.24 cm^2/m ------                                (Vmax)          ^2                                Mitral valve                                Peak E vel 43.7 cm/s   ------                                Peak A vel 68.6 cm/s   ------                                Decelerati  176 ms     150-23                                on time                0                                Peak E/A    0.6        ------  ratio                                Systemic veins                                Estimated     5 mm Hg  ------                                CVP                                 Right ventricle                                Sa vel,    11.4 cm/s   ------                                lat ann,                                tiss DP   ------------------------------------------------------------ Prepared and Electronically Authenticated by  Lyn Records. 2014-09-16T17:[id number]   STS Risk:   Procedure Name AVRepl+CABG  Risk of Mortality 9.303%  Morbidity or Mortality 38.996%  Long Length of Stay 18.786%  Short Length of Stay 11.619%  Permanent Stroke 3.842%  Prolonged Ventilation 26.998%  DSW Infection 0.433%  Renal Failure 14.710%  Reoperation 15.190%     Impression:  He is an 79 year old gentleman with  markedly decreased mobility due to severe rheumatoid arthritis and previous joint replacements who is on chronic immunosuppressive therapy. He has had prior coronary bypass graft surgery and his last catheterization in 2012 showed severe native three-vessel and left main coronary disease with a patent left internal mammary graft to the LAD and patent saphenous vein grafts to the diagonal and obtuse marginal. He now has severe symptomatic aortic stenosis with a mean gradient of 40 mm mercury and an aortic valve area of 0.43 cm squared that is further limiting his mobility with NYHA class III congestive heart failure symptoms. With his severe rheumatoid arthritis and difficulty with mobility I don't think he would be a candidate for redo sternotomy for aortic valve replacement and coronary bypass to the right coronary artery. His STS risk of mortality is 9.3% and his risk of morbidity or mortality is 39%. Despite having limited mobility due to his arthritis he still gets around with a cane and still tries to be as active as possible at home with help from his wife of 53 years. He would like to try to improve his quality of life and I think transcatheter aortic valve replacement would be a reasonable alternative to traditional surgical  aortic valve replacement. I reviewed the procedure of transcatheter aortic valve replacement with the patient and his wife, discussed the benefits and risks and the further workup required. They're interested in proceeding with further workup.   Plan:  1. CT scan of the chest, abdomen, and pelvis. 2. Gated cardiac CT scan 3. Schedule an evaluation by Dr. Cornelius Moras and Dr. Bonney Aid  Jennefer Bravo, MD 12/13/2012 4:51 PM

## 2012-12-14 ENCOUNTER — Other Ambulatory Visit: Payer: Self-pay | Admitting: *Deleted

## 2012-12-14 DIAGNOSIS — I359 Nonrheumatic aortic valve disorder, unspecified: Secondary | ICD-10-CM

## 2012-12-17 ENCOUNTER — Encounter (HOSPITAL_COMMUNITY): Payer: Self-pay | Admitting: Pharmacy Technician

## 2012-12-18 ENCOUNTER — Other Ambulatory Visit: Payer: Self-pay | Admitting: Interventional Cardiology

## 2012-12-19 ENCOUNTER — Ambulatory Visit: Payer: Medicare Other | Attending: Surgery | Admitting: Physical Therapy

## 2012-12-19 DIAGNOSIS — R5381 Other malaise: Secondary | ICD-10-CM | POA: Insufficient documentation

## 2012-12-19 DIAGNOSIS — IMO0001 Reserved for inherently not codable concepts without codable children: Secondary | ICD-10-CM | POA: Insufficient documentation

## 2012-12-20 ENCOUNTER — Ambulatory Visit (HOSPITAL_COMMUNITY)
Admission: RE | Admit: 2012-12-20 | Discharge: 2012-12-20 | Disposition: A | Discharge status: Home / Self Care | Payer: Medicare Other | Source: Ambulatory Visit | Attending: Interventional Cardiology | Admitting: Interventional Cardiology

## 2012-12-20 ENCOUNTER — Encounter (HOSPITAL_COMMUNITY)
Admission: RE | Disposition: A | Discharge status: Home / Self Care | Payer: Self-pay | Source: Ambulatory Visit | Attending: Interventional Cardiology

## 2012-12-20 DIAGNOSIS — I25810 Atherosclerosis of coronary artery bypass graft(s) without angina pectoris: Secondary | ICD-10-CM | POA: Insufficient documentation

## 2012-12-20 DIAGNOSIS — I359 Nonrheumatic aortic valve disorder, unspecified: Secondary | ICD-10-CM | POA: Insufficient documentation

## 2012-12-20 DIAGNOSIS — Z951 Presence of aortocoronary bypass graft: Secondary | ICD-10-CM

## 2012-12-20 DIAGNOSIS — I350 Nonrheumatic aortic (valve) stenosis: Secondary | ICD-10-CM

## 2012-12-20 HISTORY — PX: LEFT AND RIGHT HEART CATHETERIZATION WITH CORONARY ANGIOGRAM: SHX5449

## 2012-12-20 LAB — POCT I-STAT 3, VENOUS BLOOD GAS (G3P V)
Acid-base deficit: 1 mmol/L (ref 0.0–2.0)
Bicarbonate: 24.6 mEq/L — ABNORMAL HIGH (ref 20.0–24.0)
O2 Saturation: 65 %
TCO2: 26 mmol/L (ref 0–100)
pCO2, Ven: 41.8 mmHg — ABNORMAL LOW (ref 45.0–50.0)
pO2, Ven: 35 mmHg (ref 30.0–45.0)

## 2012-12-20 LAB — POCT I-STAT 3, ART BLOOD GAS (G3+)
Bicarbonate: 24.1 mEq/L — ABNORMAL HIGH (ref 20.0–24.0)
TCO2: 25 mmol/L (ref 0–100)
pH, Arterial: 7.417 (ref 7.350–7.450)

## 2012-12-20 SURGERY — LEFT AND RIGHT HEART CATHETERIZATION WITH CORONARY ANGIOGRAM
Anesthesia: LOCAL

## 2012-12-20 MED ORDER — ASPIRIN 81 MG PO CHEW
324.0000 mg | CHEWABLE_TABLET | ORAL | Status: AC
  Administered 2012-12-20: 324 mg via ORAL
  Filled 2012-12-20: qty 4

## 2012-12-20 MED ORDER — SODIUM CHLORIDE 0.9 % IV SOLN
INTRAVENOUS | Status: DC
  Administered 2012-12-20: 1000 mL via INTRAVENOUS

## 2012-12-20 MED ORDER — MIDAZOLAM HCL 2 MG/2ML IJ SOLN
INTRAMUSCULAR | Status: AC
  Filled 2012-12-20: qty 2

## 2012-12-20 MED ORDER — HEPARIN SODIUM (PORCINE) 1000 UNIT/ML IJ SOLN
INTRAMUSCULAR | Status: AC
  Filled 2012-12-20: qty 1

## 2012-12-20 MED ORDER — DIAZEPAM 5 MG PO TABS
5.0000 mg | ORAL_TABLET | ORAL | Status: AC
  Administered 2012-12-20: 5 mg via ORAL
  Filled 2012-12-20: qty 1

## 2012-12-20 MED ORDER — HEPARIN (PORCINE) IN NACL 2-0.9 UNIT/ML-% IJ SOLN
INTRAMUSCULAR | Status: AC
Start: 2012-12-20 — End: 2012-12-20
  Filled 2012-12-20: qty 1000

## 2012-12-20 MED ORDER — SODIUM CHLORIDE 0.9 % IV SOLN: 250.0000 mL | INTRAVENOUS | Status: DC | PRN

## 2012-12-20 MED ORDER — SODIUM CHLORIDE 0.9 % IV SOLN: 1.0000 mL/kg/h | INTRAVENOUS | Status: AC

## 2012-12-20 MED ORDER — SODIUM CHLORIDE 0.9 % IJ SOLN: 3.0000 mL | INTRAMUSCULAR | Status: DC | PRN

## 2012-12-20 MED ORDER — NITROGLYCERIN 0.2 MG/ML ON CALL CATH LAB
INTRAVENOUS | Status: AC
  Filled 2012-12-20: qty 1

## 2012-12-20 MED ORDER — LIDOCAINE HCL (PF) 1 % IJ SOLN
INTRAMUSCULAR | Status: AC
  Filled 2012-12-20: qty 30

## 2012-12-20 MED ORDER — FENTANYL CITRATE 0.05 MG/ML IJ SOLN
INTRAMUSCULAR | Status: AC
  Filled 2012-12-20: qty 2

## 2012-12-20 MED ORDER — SODIUM CHLORIDE 0.9 % IJ SOLN: 3.0000 mL | Freq: Two times a day (BID) | INTRAMUSCULAR | Status: DC

## 2012-12-20 MED ORDER — ACETAMINOPHEN 325 MG PO TABS: 650.0000 mg | ORAL_TABLET | ORAL | Status: DC | PRN

## 2012-12-20 MED ORDER — ONDANSETRON HCL 4 MG/2ML IJ SOLN: 4.0000 mg | Freq: Four times a day (QID) | INTRAMUSCULAR | Status: DC | PRN

## 2012-12-20 NOTE — H&P (Signed)
  The patient has critical aortic stenosis and is being evaluated for TAVR. He has developed progressive fatigue and dyspnea. Has also has some chest discomfort. Prior history of bypass surgery. Known history of at least one saphenous vein graft that is failed. He has been evaluated by Dr. Laneta Simmers, who is requested. Left and right heart catheterization as a staging for the possible. Valve procedure. The patient has no new symptoms. The procedure and risks, including stroke, death, myocardial infarction, kidney injury, limb ischemia, allergy, among others. Has been discussed in detail with the patient and his wife, who consented to proceedingCath Lab Visit (complete for each Cath Lab visit)  Clinical Evaluation Leading to the Procedure:   ACS: no  Non-ACS:    Anginal Classification: No Symptoms  Anti-ischemic medical therapy: No Therapy  Non-Invasive Test Results: No non-invasive testing performed  Prior CABG: Previous CABG

## 2012-12-20 NOTE — CV Procedure (Signed)
Diagnostic Left hand, Right Heart Catheterization with Coronary and Bypass Graft Angiography Report  Hunter Choi  79 y.o.  male [birth year]  Procedure Date: 12/20/2012 Referring Physician: Casimiro Needle Cooper/ [facility] Primary Cardiologist:: Pre TAVR evaluation   PROCEDURE:  Left and right  heart catheterization with selective coronary angiography, bypass graft angiography,  left ventriculogram.  INDICATIONS:   The patient is being considered for TAVR.  The risks, benefits, and details of the procedure were explained to the patient.  The patient verbalized understanding and wanted to proceed.  Informed written consent was obtained.  PROCEDURE TECHNIQUE:  After Xylocaine anesthesia a 6 French sheath was placed in the right femoral artery and a 7 French sheath in the right femoral vein  with a single anterior needle wall stick.   There is heavy calcification and tortuosity within the distal aortoiliac and femoral vascular territory. Right heart catheterization was performed with a Swan-Ganz . Both thermodilution and Fick cardiac output determinations were made. Left heart catheterization was performed using an A2 MP 6 French catheter. The saphenous vein grafts were in much with this catheter. A 6 [place] JR 4 was used for right coronary angiography, and also used to attempt crossing the aortic valve. We were successful in crossing the valve with a straight 0.036 wire high out of the 6 [place] Judkins right catheter would not advance through the valve. Since we knew that the valve is extremely tight, further attempts to cross the valve were aborted. Prior crossing the valve. We did give 2000 units of IV heparin. Left coronary angiography was done using a 6 [place] JL4 diagnostic catheter.  Left ventriculography was not performed as we were unable to advance the Judkins right catheter across the aortic valve after we entered the ventricle with the guidewire. No complications occurred.       CONTRAST:  Total of  170 cc.  COMPLICATIONS:  None.    HEMODYNAMICS:  Aortic pressure was  144/69 mmHg; LV pressure was  not recorded; LVEDP  not recorded; right atrial mean 6 mm mercury; right ventricle 28/6 mmHg; PA pressure 23/8 mmHg; pulmonary capillary wedge mean 8 mm mercury; cardiac output 4.11 L per minute (Thermal), and 5.46 L per minute (Fick); Aortic Valve gradient was not recorded.     ANGIOGRAPHIC DATA:   The left main coronary artery is  severe distal disease.  The left anterior descending artery is  competitive flow with the patent LIMA.  The left circumflex artery is  totally occluded.  The right coronary artery is  totally occluded distally with severe proximal and mid disease.  BYPASS GRAFT ANGIOGRAPHY:  SVG to diagonal: Ectatic, but widely patent.  SVG to obtuse marginal: Ectatic, but widely patent.  Saphenous vein graft to the distal right coronary: Totally occluded at the aorto ostial junction.  LIMA to LAD: Widely patent  LEFT VENTRICULOGRAM:  Not performed. A Judkins, right diagnostic catheter would not advance across the aortic valve. After crossing the valve with a straight tipped 0.036 mm wire .  IMPRESSIONS:   1. Total occlusion of the saphenous vein graft to the right coronary. This is been previously documented.  2. Widely patent saphenous vein graft to the diagonal, obtuse marginal, and the LIMA to the LAD.  3. Normal pulmonary artery pressures.  4. Ectatic and calcified right aortoiliac anatomy.  5. Previously documented severe/critical aortic stenosis, , documented by echo and suggested by this cath as a diagnostic catheter would not cross the valve.  RECOMMENDATION:   He will be referred back to the valve clinic for further workup and hopefully progression to TAVR for critical AS.

## 2012-12-21 ENCOUNTER — Ambulatory Visit: Payer: Medicare Other | Admitting: Physical Therapy

## 2012-12-26 ENCOUNTER — Ambulatory Visit (HOSPITAL_COMMUNITY)
Admission: RE | Admit: 2012-12-26 | Discharge: 2012-12-26 | Disposition: A | Discharge status: Home / Self Care | Payer: Medicare Other | Source: Ambulatory Visit | Attending: Surgery | Admitting: Surgery

## 2012-12-26 ENCOUNTER — Ambulatory Visit (HOSPITAL_COMMUNITY): Payer: Medicare Other

## 2012-12-26 DIAGNOSIS — I359 Nonrheumatic aortic valve disorder, unspecified: Secondary | ICD-10-CM

## 2012-12-26 DIAGNOSIS — R0989 Other specified symptoms and signs involving the circulatory and respiratory systems: Secondary | ICD-10-CM | POA: Insufficient documentation

## 2012-12-26 DIAGNOSIS — I2584 Coronary atherosclerosis due to calcified coronary lesion: Secondary | ICD-10-CM | POA: Insufficient documentation

## 2012-12-26 DIAGNOSIS — I2510 Atherosclerotic heart disease of native coronary artery without angina pectoris: Secondary | ICD-10-CM | POA: Insufficient documentation

## 2012-12-26 DIAGNOSIS — I700 Atherosclerosis of aorta: Secondary | ICD-10-CM | POA: Insufficient documentation

## 2012-12-26 DIAGNOSIS — J988 Other specified respiratory disorders: Secondary | ICD-10-CM | POA: Insufficient documentation

## 2012-12-26 DIAGNOSIS — R05 Cough: Secondary | ICD-10-CM | POA: Insufficient documentation

## 2012-12-26 DIAGNOSIS — R059 Cough, unspecified: Secondary | ICD-10-CM | POA: Insufficient documentation

## 2012-12-26 DIAGNOSIS — R0609 Other forms of dyspnea: Secondary | ICD-10-CM | POA: Insufficient documentation

## 2012-12-26 DIAGNOSIS — I25810 Atherosclerosis of coronary artery bypass graft(s) without angina pectoris: Secondary | ICD-10-CM | POA: Insufficient documentation

## 2012-12-26 LAB — CREATININE, SERUM
Creatinine, Ser: 1.03 mg/dL (ref 0.50–1.35)
GFR calc Af Amer: 75 mL/min — ABNORMAL LOW (ref >90)
GFR calc non Af Amer: 65 mL/min — ABNORMAL LOW (ref >90)

## 2012-12-26 LAB — PULMONARY FUNCTION TEST

## 2012-12-26 MED ORDER — IOHEXOL 350 MG/ML SOLN
80.0000 mL | Freq: Once | INTRAVENOUS | Status: AC | PRN
  Administered 2012-12-26: 80 mL via INTRAVENOUS

## 2012-12-26 MED ORDER — IOHEXOL 350 MG/ML SOLN
100.0000 mL | Freq: Once | INTRAVENOUS | Status: AC | PRN
  Administered 2012-12-26: 100 mL via INTRAVENOUS

## 2012-12-31 ENCOUNTER — Telehealth: Payer: Self-pay | Admitting: Cardiovascular Disease

## 2012-12-31 NOTE — Telephone Encounter (Signed)
Follow Up  Pt states he was called to set an appt w/ Dr. Excell Seltzer as a work in// no avail appt until 10/29. Please advise.

## 2012-12-31 NOTE — Telephone Encounter (Signed)
I spoke with the pt's wife and made her aware that we were trying to work the pt into Dr [employer] schedule today.  Dr Excell Seltzer has now finished his clinic for the day.  The pt will keep his appointment with Dr Cornelius Moras tomorrow and then we will determine an alternate time for the pt to meet with Dr Excell Seltzer.  We are currently trying to do TAVR on 01/15/13 and Dr Earmon Phoenix next clinic day is 01/16/13.

## 2013-01-01 ENCOUNTER — Other Ambulatory Visit: Payer: Self-pay | Admitting: *Deleted

## 2013-01-01 ENCOUNTER — Encounter: Payer: Self-pay | Admitting: Thoracic Surgery (Cardiothoracic Vascular Surgery)

## 2013-01-01 ENCOUNTER — Institutional Professional Consult (permissible substitution) (INDEPENDENT_AMBULATORY_CARE_PROVIDER_SITE_OTHER): Payer: Medicare Other | Admitting: Thoracic Surgery (Cardiothoracic Vascular Surgery)

## 2013-01-01 ENCOUNTER — Encounter: Payer: Self-pay | Admitting: Surgery

## 2013-01-01 VITALS — BP 123/70 | HR 71 | Resp 16 | Ht 63.25 in | Wt 183.0 lb

## 2013-01-01 DIAGNOSIS — I25810 Atherosclerosis of coronary artery bypass graft(s) without angina pectoris: Secondary | ICD-10-CM

## 2013-01-01 DIAGNOSIS — I2510 Atherosclerotic heart disease of native coronary artery without angina pectoris: Secondary | ICD-10-CM

## 2013-01-01 DIAGNOSIS — I359 Nonrheumatic aortic valve disorder, unspecified: Secondary | ICD-10-CM

## 2013-01-01 DIAGNOSIS — Z951 Presence of aortocoronary bypass graft: Secondary | ICD-10-CM

## 2013-01-01 DIAGNOSIS — I25709 Atherosclerosis of coronary artery bypass graft(s), unspecified, with unspecified angina pectoris: Secondary | ICD-10-CM | POA: Insufficient documentation

## 2013-01-01 DIAGNOSIS — I350 Nonrheumatic aortic (valve) stenosis: Secondary | ICD-10-CM

## 2013-01-01 NOTE — H&P (Signed)
[facility]  MULTIDISCIPLINARY HEART VALVE CLINIC    CARDIOTHORACIC SURGERY CONSULTATION REPORT  Referring Provider is Lesleigh Noe, MD PCP is Darnelle Bos, MD  Chief Complaint  Patient presents with  . Aortic Stenosis    severe...further discuss TAVR    HPI:  Patient is an 79 year old white male from [place] with long-standing history of coronary artery disease, aortic stenosis, hypertension, steroid and methotrexate dependent rheumatoid arthritis, cerebrovascular disease, and bilateral iliac artery aneurysms referred for second opinion consultation to consider transcatheter aortic valve replacement as an alternative to high-risk conventional surgery for severe symptomatic aortic stenosis. The patient's cardiac history dates back to 1999 at which time he underwent coronary artery bypass grafting x4 by Dr. Tyrone Sage for left main disease with severe three-vessel coronary artery disease. Grafts placed at the time of surgery included left internal mammary artery to the distal left anterior descending coronary artery, saphenous vein graft to a large first diagonal branch, saphenous vein graft to large obtuse marginal branch of left circumflex coronary artery, and saphenous vein graft to the distal right coronary artery. The patient did well for many years.  He developed recurrent angina in 2004 and underwent cardiac catheterization on 09/25/2002 showing a chronically occluded saphenous vein graft to the right coronary with all the other grafts patent.  There was a 99% stenosis in the distal LAD which was subsequently treated on 09/27/2002 with a drug-eluting stent placed through the left internal mammary artery into the distal LAD.  Over last several years the patient has developed gradual progression of symptoms of exertional shortness of breath. The patient now gets short of breath with very mild activity and intermittently has problems with resting shortness of  breath and PND.  Transthoracic echocardiograms have demonstrated corresponding progression of severity of aortic stenosis, with an echocardiogram performed last month demonstrating peak velocity across the aortic valve > 4 m/sec and mean transvalvular gradient estimated > 40 mmHg.  Left ventricular systolic function remains reasonably well preserved with ejection fraction estimated 50-55%.  Left and right heart catheterization was performed demonstrating severe left main disease with three-vessel coronary artery disease but patent grafts from previous bypass surgery including widely patent left internal mammary artery to distal left anterior descending coronary artery, patent vein graft to the large first diagonal branch, and patent vein graft to the large obtuse marginal branch of left circumflex coronary artery. The vein graft to right coronary artery is chronically occluded. The native right coronary artery is patent but severely diseased.  The patient was referred for surgical consultation and has been seen previously by Dr. Laneta Simmers. The patient is felt to be a relatively poor candidate for conventional surgical aortic valve replacement with or without redo coronary artery bypass grafting. Subsequently the patient has undergone diagnostic testing to evaluate the feasibility of transcatheter aortic valve replacement as an alternative to high-risk conventional surgery. The patient has been referred for a second surgical opinion in this regard to discuss treatment options further.  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 He has not had any chest pain or chest tightness. He has not had any dizzy spells nor syncope. He has chronic bilateral lower extremity edema. He is severely limited by chronic steroid and methotrexate-dependent rheumatoid  arthritis. He suffered a fall several years ago which was complicated by a fractured hip. Despite this he is  able to ambulate independently using a cane or a walker.   Past Medical History  Diagnosis Date  . Hypertension   . GERD (gastroesophageal reflux disease)   . Thyroid disease     HYPOTHYROIDISM  . Aortic stenosis, severe   . Embolism involving retinal artery   . BPH (benign prostatic hypertrophy)     Dr. Retta Diones  . Iliac artery aneurysm     Dr. Edilia Bo  . AAA (abdominal aortic aneurysm)     Dr. Ashley Royalty  . Diastolic heart failure   . 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 Coronary artery disease     Left main disease and severe 3-vessel CAD  . CAD (coronary artery disease) of bypass graft     Chronic occlusion of saphenous vein graft to RCA    Past Surgical History  Procedure Laterality Date  . Left hip hemiarthropasty      AFTER HIP FX  . Coronary artery bypass graft      1999..Dr. Sheliah Plane  . Carotid endarterectomy    . Total knee arthroplasty      right 1992   left  2002  . Biopsy prostate      2005  . Prostate surgery  2012    TURP  . Joint replacement  2007    THR  . Harvest bone graft  1982    FOR THE  ANKLE  . Skin grafts      1968    No family history on file.  History   Social History  . Marital Status: Married    Spouse Name: N/A    Number of Children: N/A  . Years of Education: N/A   Occupational History  . Not on file.   Social History Main Topics  . Smoking status: Former Smoker -- 0.50 packs/day for 47 years    Types: Cigarettes    Quit date: 01/02/1964  . Smokeless tobacco: Former Neurosurgeon    Types: Chew     Comment: ONLY USED 2 YEARS  . Alcohol Use: No  . Drug Use: Not on file  . Sexual Activity: Not on file   Other Topics Concern  . Not on file   Social History Narrative  . No narrative on file    Current Outpatient  Prescriptions  Medication Sig Dispense Refill  . acetaminophen (TYLENOL) 500 MG tablet Take 1,000 mg by mouth 2 (two) times daily.       Marland Kitchen amLODipine (NORVASC) 5 MG tablet Take 5 mg by mouth daily.      Marland Kitchen aspirin 325 MG EC tablet Take 325 mg by mouth daily.      . diazepam (VALIUM) 5 MG tablet Take 5 mg by mouth daily as needed for anxiety.      . folic acid (FOLVITE) 1 MG tablet Take 3 mg by mouth daily.      Marland Kitchen levothyroxine (SYNTHROID, LEVOTHROID) 25 MCG tablet Take 25 mcg by mouth 4 (four) times a week. Monday, Wednesday,Friday, and Saturday      . methotrexate 2.5 MG tablet Take 25 mg by mouth every Saturday.       . metoprolol tartrate (LOPRESSOR) 25 MG tablet Take 12.5 mg by mouth daily.      . nitroGLYCERIN (NITROSTAT) 0.4 MG SL tablet Place 0.4 mg under the tongue every 5 (five) minutes as needed for chest  pain.      . omeprazole (PRILOSEC) 20 MG capsule Take 20 mg by mouth daily.      . predniSONE (DELTASONE) 5 MG tablet Take 5 mg by mouth daily.      . simvastatin (ZOCOR) 20 MG tablet Take 20 mg by mouth every evening.       No current facility-administered medications for this visit.    Allergies  Allergen Reactions  . Ciprofloxacin     REACTION: mild delirium      Review of Systems:   General:  normal appetite, normal energy, + weight gain, no weight loss, no fever  Cardiac:  no chest pain with exertion, no chest pain at rest, + SOB with mild exertion, intermittent resting SOB, + PND, no orthopnea, + palpitations, no arrhythmia, no atrial fibrillation, + LE edema, no dizzy spells, no syncope  Respiratory:  + shortness of breath, no home oxygen, no productive cough, + dry cough, no bronchitis, no wheezing, no hemoptysis, no asthma, no pain with inspiration or cough, no sleep apnea, no CPAP at night  GI:   no difficulty swallowing, no reflux, no frequent heartburn, no hiatal hernia, no abdominal pain, no constipation, no diarrhea, no hematochezia, no hematemesis, no  melena  GU:   no dysuria,  no frequency, no urinary tract infection, no hematuria, + enlarged prostate, no kidney stones, no kidney disease  Vascular:  no pain suggestive of claudication, + pain in feet, no leg cramps, no varicose veins, no DVT, no non-healing foot ulcer  Neuro:   + stroke, no TIA's, no seizures, no headaches, no temporary blindness one eye,  no slurred speech, no peripheral neuropathy, no chronic pain, + instability of gait, no memory/cognitive dysfunction  Musculoskeletal: + severe arthritis, + joint swelling, no myalgias, + difficulty walking, limited mobility   Skin:   no rash, no itching, no skin infections, no pressure sores or ulcerations  Psych:   no anxiety, no depression, no nervousness, no unusual recent stress  Eyes:   + blurry vision, no floaters, no recent vision changes,  wears glasses or contacts  ENT:   + chronic hearing loss, no loose or painful teeth, no dentures, last saw dentist   Hematologic:  + easy bruising, no abnormal bleeding, no clotting disorder, no frequent epistaxis  Endocrine:  no diabetes, does not check CBG's at home           Physical Exam:   BP 123/70  Pulse 71  Resp 16  Ht 5' 3.25" (1.607 m)  Wt 183 lb (83.008 kg)  BMI 32.14 kg/m2  SpO2 95%  General:  Elderly and frail-appearing  HEENT:  Unremarkable   Neck:   no JVD, no bruits, no adenopathy   Chest:   clear to auscultation, symmetrical breath sounds, no wheezes, no rhonchi   CV:   RRR, grade IV/VI crescendo/decrescendo murmur heard best at RUSB,  no diastolic murmur  Abdomen:  soft, non-tender, no masses   Extremities:  warm, well-perfused, pulses not palpable at ankle, + bilateral LE edema  Rectal/GU  Deferred  Neuro:   Grossly non-focal and symmetrical throughout  Skin:   Clean and dry, no rashes, no breakdown   Diagnostic Tests:  Transthoracic Echocardiography  Patient:    Hunter Choi, Saxer MR #:       [id number] Study Date: 12/11/2012 Gender:     M Age:         89 Height:     152.4cm Weight:     85.3kg  BSA:        1.44m^2 Pt. Status: Room:    PERFORMING   [facility]  ORDERING     Leia Alf, Henry  REFERRING    Veatrice Kells  SONOGRAPHER  Arvil Chaco cc:  ------------------------------------------------------------ LV EF: 50% -   55%  ------------------------------------------------------------ Indications:      Aortic stenosis 424.1.  ------------------------------------------------------------ History:   Risk factors:  Hypertension.  ------------------------------------------------------------ Study Conclusions  - Left ventricle: The cavity size was normal. Wall thickness   was increased in a pattern of moderate LVH. Systolic   function was normal. The estimated ejection fraction was   in the range of 50% to 55%. Doppler parameters are   consistent with abnormal left ventricular relaxation   (grade 1 diastolic dysfunction). - Aortic valve: Moderately calcified annulus. Severely   thickened, severely calcified leaflets. Transvalvular   velocity was increased, due to stenosis. There was   critical stenosis. Valve area: 0.43cm^2(VTI). Valve area:   0.43cm^2 (Vmax). - Mitral valve: Calcified annulus. - Left atrium: The atrium was moderately dilated. Transthoracic echocardiography.  M-mode, complete 2D, spectral Doppler, and color Doppler.  Height:  Height: 152.4cm. Height: 60in.  Weight:  Weight: 85.3kg. Weight: 187.6lb.  Body mass index:  BMI: 36.7kg/m^2.  Body surface area:    BSA: 1.40m^2.  Blood pressure:     152/92.  Patient status:  Outpatient.  Location:  Echo laboratory.  ------------------------------------------------------------  ------------------------------------------------------------ Left ventricle:  The cavity size was normal. Wall thickness was increased in a pattern of moderate LVH. Systolic function was normal. The estimated ejection fraction was in the range of 50% to 55%. Images were  inadequate for LV wall motion assessment. Doppler parameters are consistent with abnormal left ventricular relaxation (grade 1 diastolic dysfunction).  ------------------------------------------------------------ Aortic valve:   Moderately calcified annulus. Severely thickened, severely calcified leaflets.  Doppler: Transvalvular velocity was increased, due to stenosis. There was critical stenosis.    Valve area: 0.43cm^2(VTI). Indexed valve area: 0.24cm^2/m^2 (VTI). Peak velocity ratio of LVOT to aortic valve: 0.14. Valve area: 0.43cm^2 (Vmax). Indexed valve area: 0.24cm^2/m^2 (Vmax).    Mean gradient: 40mm Hg (S). Peak gradient: 65mm Hg (S).  ------------------------------------------------------------ Aorta:  The aorta was normal, not dilated, and non-diseased.   ------------------------------------------------------------ Mitral valve:   Calcified annulus.  Doppler:   Trivial regurgitation.  ------------------------------------------------------------ Left atrium:  The atrium was moderately dilated.  ------------------------------------------------------------ Atrial septum:  Poorly visualized.  ------------------------------------------------------------ Right ventricle:  The cavity size was normal. Wall thickness was normal. Systolic function was normal.  ------------------------------------------------------------ Pulmonic valve:    The valve appears to be grossly normal.  Doppler:   Trivial regurgitation.  ------------------------------------------------------------ Tricuspid valve:   Structurally normal valve.   Leaflet separation was normal.  Doppler:  Transvalvular velocity was within the normal range.  No regurgitation.  ------------------------------------------------------------ Pulmonary artery:   Poorly visualized.  ------------------------------------------------------------ Right atrium:  The atrium was normal in  size.  ------------------------------------------------------------ Pericardium:  There was no pericardial effusion.  ------------------------------------------------------------ Systemic veins: Inferior vena cava: The vessel was normal in size; the respirophasic diameter changes were in the normal range (= 50%); findings are consistent with normal central venous pressure.  ------------------------------------------------------------ Post procedure conclusions Ascending Aorta:  - The aorta was normal, not dilated, and non-diseased.  ------------------------------------------------------------  2D measurements        Normal  Doppler measurements   Normal Left ventricle                 Left ventricle  LVID ED,   47.8 mm     43-52   Ea, lat    8.49 cm/s   ------ chord,                         ann, tiss PLAX                           DP LVID ES,   33.9 mm     23-38   E/Ea, lat  5.15        ------ chord,                         ann, tiss PLAX                           DP FS, chord,   29 %      >29     Ea, med    3.26 cm/s   ------ PLAX                           ann, tiss LVPW, ED     13 mm     ------  DP IVS/LVPW   1.03        <1.3    E/Ea, med  13.4        ------ ratio, ED                      ann, tiss Ventricular septum             DP IVS, ED    13.4 mm     ------  LVOT LVOT                           Peak vel,    55 cm/s   ------ Diam, S      20 mm     ------  S Area       3.14 cm^2   ------  Aortic valve Aorta                          Peak vel,   403 cm/s   ------ Root diam,   34 mm     ------  S ED                             Mean vel,   302 cm/s   ------ Left atrium                    S AP dim       45 mm     ------  VTI, S     96.5 cm     ------ AP dim     2.47 cm/m^2 <2.2    Mean         40 mm Hg  ------ index                          gradient,                                S  Peak         65 mm Hg  ------                                 gradient,                                S                                Area, VTI  0.43 cm^2   ------                                Area index 0.24 cm^2/m ------                                (VTI)           ^2                                Peak vel   0.14        ------                                ratio,                                LVOT/AV                                Area, Vmax 0.43 cm^2   ------                                Area index 0.24 cm^2/m ------                                (Vmax)          ^2                                Mitral valve                                Peak E vel 43.7 cm/s   ------                                Peak A vel 68.6 cm/s   ------                                Decelerati  176 ms     150-23  on time                0                                Peak E/A    0.6        ------                                ratio                                Systemic veins                                Estimated     5 mm Hg  ------                                CVP                                Right ventricle                                Sa vel,    11.4 cm/s   ------                                lat ann,                                tiss DP   ------------------------------------------------------------ Prepared and Electronically Authenticated by  Lyn Records. [id number]      Diagnostic Left hand, Right Heart Catheterization with Coronary and Bypass Graft Angiography Report  BRUK TUMOLO  79 y.o.   male 17-Mar-1928  Procedure Date: 12/20/2012 Referring Physician: Casimiro Needle Cooper/ [facility] Primary Cardiologist:: Pre TAVR evaluation   PROCEDURE:  Left and right  heart catheterization with selective coronary angiography, bypass graft angiography,  left ventriculogram.  INDICATIONS:   The patient is being considered for TAVR.  The risks, benefits, and details of the procedure  were explained to the patient.  The patient verbalized understanding and wanted to proceed.  Informed written consent was obtained.  PROCEDURE TECHNIQUE:  After Xylocaine anesthesia a 6 French sheath was placed in the right femoral artery and a 7 French sheath in the right femoral vein  with a single anterior needle wall stick.   There is heavy calcification and tortuosity within the distal aortoiliac and femoral vascular territory. Right heart catheterization was performed with a Swan-Ganz . Both thermodilution and Fick cardiac output determinations were made. Left heart catheterization was performed using an A2 MP 6 French catheter. The saphenous vein grafts were in much with this catheter. A 6 [place] JR 4 was used for right coronary angiography, and also used to attempt crossing the aortic valve. We were successful in crossing the valve with a straight 0.036 wire high out of the 6 [place] Judkins right catheter would not advance through the valve. Since we  knew that the valve is extremely tight, further attempts to cross the valve were aborted. Prior crossing the valve. We did give 2000 units of IV heparin. Left coronary angiography was done using a 6 [place] JL4 diagnostic catheter.  Left ventriculography was not performed as we were unable to advance the Judkins right catheter across the aortic valve after we entered the ventricle with the guidewire. No complications occurred.       CONTRAST:  Total of  170 cc.  COMPLICATIONS:  None.    HEMODYNAMICS:  Aortic pressure was  144/69 mmHg; LV pressure was  not recorded; LVEDP  not recorded; right atrial mean 6 mm mercury; right ventricle 28/6 mmHg; PA pressure 23/8 mmHg; pulmonary capillary wedge mean 8 mm mercury; cardiac output 4.11 L per minute (Thermal), and 5.46 L per minute (Fick); Aortic Valve gradient was not recorded.     ANGIOGRAPHIC DATA:   The left main coronary artery is  severe distal disease.  The left anterior descending artery is   competitive flow with the patent LIMA.  The left circumflex artery is  totally occluded.  The right coronary artery is  totally occluded distally with severe proximal and mid disease.  BYPASS GRAFT ANGIOGRAPHY:  SVG to diagonal: Ectatic, but widely patent.  SVG to obtuse marginal: Ectatic, but widely patent.  Saphenous vein graft to the distal right coronary: Totally occluded at the aorto ostial junction.  LIMA to LAD: Widely patent  LEFT VENTRICULOGRAM:  Not performed. A Judkins, right diagnostic catheter would not advance across the aortic valve. After crossing the valve with a straight tipped 0.036 mm wire .  IMPRESSIONS:   1. Total occlusion of the saphenous vein graft to the right coronary. This is been previously documented.  2. Widely patent saphenous vein graft to the diagonal, obtuse marginal, and the LIMA to the LAD.  3. Normal pulmonary artery pressures.  4. Ectatic and calcified right aortoiliac anatomy.  5. Previously documented severe/critical aortic stenosis, , documented by echo and suggested by this cath as a diagnostic catheter would not cross the valve.   RECOMMENDATION:   He will be referred back to the valve clinic for further workup and hopefully progression to TAVR for critical AS.         CARDIAC GATED CT [facility] OF THE HEART  Reyes Ivan, MD   Caleen Essex Dec 28, 2012  4:20:57 PM EDT       ADDENDUM REPORT: 12/28/2012 16:18   ADDENDUM: OVER-READ INTERPRETATION  CT CHEST   The following report is an over-read performed by radiologist Dr. Leanna Battles of [facility] on Creation date. This over-read does not include interpretation of cardiac or coronary anatomy or pathology. The CTA interpretation by the cardiologist is attached.   COMPARISON:  COMPARISON CT chest 04/26/2010.   FINDINGS: FINDINGS No pathologically enlarged mediastinal lymph nodes. Right hilar lymph node measures 1.2 cm and is likely unchanged.  Atherosclerotic calcification of the arterial vasculature. Small hiatal hernia. Visualized portions of the lungs show scattered atelectasis or scarring. Visualized portions of the airway, upper abdomen and bones are unremarkable.   IMPRESSION: IMPRESSION   No acute extracardiac findings.     Electronically Signed   By: Leanna Battles M.D.   On: 12/28/2012 16:18       Study Result    CLINICAL DATA:  Severe AS Pre TAVR Planning   EXAM: Cardiac TAVR CT   TECHNIQUE: The patient was scanned on a Philips 256 scanner. A 120 kV  retrospective scan was triggered in the descending thoracic aorta at 111 HU's. Gantry rotation speed was 270 msec's and collimation was .9 mm. No beta blockade or nitro were given. The 3 D data set was reconstructed in 5% intervals of the R-R cycle. Systolic and diastolic phases were analyzed on a dedicated work station using MPR, MIP and VRT modes. The patient received 80 cc of contrast.   FINDINGS: Aortic Valve: The aortic valve was trileaflet. There was severe calcification of all leaflet tips. There was calcification of the annulus at the base of the right coronary cusp. There was minimal calcification of the sinotubular junction and no calcification of the intervalvular fibrosa   Aorta: The ascending aorta has scattered calcification. The arch has scattered calcification There is an area near the take off of the left subclavian that has somewhat bulky soft plaque/mural thrombus   No severe tortuosity, aneurysm or dissection   Sinotubular Junction:  27.8 mm   Ascending Thoracic Aorta:  29.7 mm   Aortic Arch:  22.8 mm   Descending Thoracic Aorta:  22 mm   Sinus of Valsalva Measurements:   Non-coronary:  34 mm   Right -coronary:  32 mm   Left -coronary:  30.5 mm   Coronary Artery Height above Annulus:   Left Main:  14mm above annulus   Right Coronary:  19 mm above annulus   Virtual Basal Annulus Measurements:   Maximum/Minimum  Diameter:  31.62mm and 23.5 mm   Perimeter:  87 mm   Area:  590 mm 40% phase   Coronary Arteries: Heavily calcified: LM non obstructive. LAD possibly occluded in mid vessel fills from LIMA. Circumflex occluded. RCA occluded distally. SVG to RCA occluded SVG to Diagonal patent, SVG to OM patent LIMA patent to LAD.   IMPRESSION: 1) Virtual basal annulus suitable for a fully inflated 29mm Sapien XT valve   2) Somewhat bulky mural plaque in the aortic arch near the left subclavian take off that may pose an embolic risk for transfemoral approach   3) Occluded RCA graft with patent LIMA, and SVG;s to OM and Diagonal   Charlton Haws   Electronically Signed: By: Charlton Haws On: 12/26/2012 18:17         CT ANGIOGRAPHY ABDOMEN AND PELVIS WITH CONTRAST AND WITHOUT CONTRAST   TECHNIQUE: Multidetector CT imaging of the abdomen and pelvis was performed using the standard protocol during bolus administration of intravenous contrast. Multiplanar reconstructed images including MIPs were obtained and reviewed to evaluate the vascular anatomy.   CONTRAST:  OMNIPAQUE IOHEXOL 350 MG/ML SOLN   COMPARISON:  No priors.   FINDINGS: CT ABDOMEN AND PELVIS FINDINGS   Abdomen/Pelvis: The appearance of the liver, gallbladder, pancreas, spleen and bilateral adrenal glands is unremarkable. There is multifocal cortical thinning in the kidneys bilaterally, favored to represent mild post infectious or inflammatory scarring. Mild bilateral renal atrophy (left greater than right). Multiple subcentimeter low attenuation lesions are seen in the kidneys bilaterally, too small to definitively characterize.   Extensive atherosclerosis of the abdominal and pelvic vasculature, with vascular measurements pertinent to potential TAVR procedure, as below. There is fusiform aneurysmal dilatation of the distal infrarenal abdominal aorta which measures up to 4.4 x 4.7 cm at the level of the aortic  bifurcation. This aneurysmal dilatation extends into both of the common iliac arteries, with the right common iliac artery measuring up to 4.6 cm in diameter and the left common iliac artery measuring up to 4.0 cm in diameter respectively.  This aneurysmal dilatation further extends into the right internal iliac artery distribution, with the right internal iliac artery measuring up to 4.5 cm in diameter. There is also mild aneurysmal dilatation of the proximal left external iliac artery which measures up to 2.3 cm in diameter. All of these vessels are markedly tortuous, and there is extensive eccentric nonenhancing material associated with the areas of aneurysmal dilatation, compatible with extensive mural thrombus. Aneurysmal dilatation of the proximal right common femoral artery is also noted measuring up to 2.2 cm in diameter. Left superficial femoral artery appears completely occluded shortly after its origin.   Evaluation of the anatomic pelvis is limited by beam hardening artifact from patient's left-sided total hip arthroplasty. With these limitations in mind, there is no significant volume of ascites. No pneumoperitoneum. No pathologic distention of small bowel. Numerous colonic diverticula are noted, most pronounced in the distal descending colon and proximal sigmoid colon, without surrounding inflammatory changes to suggest an acute diverticulitis at this time. Small ventral hernia in the epigastric region containing only a small amount of omental fat. No evidence of associated bowel incarceration or obstruction. No definite pathologic lymphadenopathy identified within the abdomen or pelvis.   Musculoskeletal: There are no aggressive appearing lytic or blastic lesions noted in the visualized portions of the skeleton.   VASCULAR MEASUREMENTS PERTINENT TO TAVR:   AORTA:   Minimal Aortic Diameter -  2.0 x 1.6 cm   Severity of Aortic Calcification -  moderate   RIGHT  PELVIS:   Right Common Iliac Artery -   Minimal Diameter - 18.3 x 16.9 mm   Tortuosity - mild   Calcification - moderate   Right External Iliac Artery -   Minimal Diameter - 12.3 x 7.2 mm   Tortuosity - severe   Calcification - moderate   Right Common Femoral Artery -   Minimal Diameter - 11.8 x 11.7 mm   Tortuosity - mild   Calcification - moderate   LEFT PELVIS:   Left Common Iliac Artery -   Minimal Diameter - 17.7 x 18.9 mm   Tortuosity - mild   Calcification - moderate   Left External Iliac Artery -   Minimal Diameter - 11.2 x 6.3 mm   Tortuosity - severe   Calcification - moderate   Left Common Femoral Artery -   Minimal Diameter - 8.5 x 7.5 mm   Tortuosity - mild   Calcification - mild   Review of the MIP images confirms the above findings.   IMPRESSION: 1. Findings and measurements pertinent to potential TAVR procedure, as discussed above. Although the calibers of the pelvic arterial system is large enough to accommodate the devices, the extreme tortuosity of the external iliac arteries bilaterally, the multifocal aneurysmal dilatation, and the extensive atheromatous plaque and mural thrombus throughout the systems makes pelvic arterial access challenging. 2. In addition to the vascular findings pertinent to potential TAVR, there is extensive aneurysm formation throughout the distal infrarenal abdominal aorta as well as the pelvic vasculature bilaterally, as detailed above. As well, there is complete occlusion of the proximal left superficial femoral artery. Throughout these areas of aneurysmal dilatation there is extensive mural thrombus and atheromatous plaque. 3. Colonic diverticulosis without findings to suggest acute diverticulitis at this time. 4. Additional incidental findings, as above.     Electronically Signed   By: Trudie Reed M.D.   On: 12/31/2012 11:47      Pulmonary Function Tests  Baseline     FVC  3.15 L   (  107% predicted)  FEV1  2.26 L  (113% predicted)  FEF25-75 1.46 L  (115% predicted)   RV  1.62 L  (67% predicted) DLCO  73% predicted     STS Risk Calculator  Procedure    AVR + redo CABG  Risk of Mortality   12.9% Morbidity or Mortality  43% Prolonged LOS   19.3% Short LOS    10.7% Permanent Stroke   4.1% Prolonged Vent Support  31.0% DSW Infection    0.4% Renal Failure    16.4% Reoperation    16.2%     Impression:  Patient has severe symptomatic aortic stenosis with reasonably well preserved left ventricular systolic function and worsening symptoms of congestive heart failure, currently functional class III.  He also has left main disease and three-vessel coronary artery disease status post coronary artery bypass grafting in the distant past. 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 Patient does have significant disease in the native right coronary artery, although he has not been having symptoms of angina.  The patient additionally has severe steroid and methotrexate dependent chronic rheumatoid arthritis with a previous fracture the left hip that severely limits his physical mobility. Risks associated with conventional surgical aortic valve replacement with or without redo coronary artery bypass grafting would be exceptionally high and probably considerably higher than that predicted using the STS risk model.  I do not feel the patient should be considered candidate for conventional surgical aortic valve replacement but I agree that transcatheter aortic valve replacement may be a reasonable alternative to extremely high-risk surgery. However, the patient has severe atherosclerotic disease of the infrarenal aorta and bilateral iliac arteries with large bilateral common iliac artery aneurysms.  As a result, I feel that the transapical approach for  transcatheter aortic valve replacement would be the best alternative.       Plan:  The patient and his wife were counseled at length regarding treatment alternatives for management of severe symptomatic aortic stenosis. The risks and benefits of surgical intervention has been discussed in detail. Long-term prognosis with medical therapy was discussed. Alternative approaches such as conventional surgical aortic valve replacement, transcatheter aortic valve replacement, and palliative medical therapy were compared and contrasted at length. This discussion was placed in the context of the patient's own specific clinical presentation and past medical history.  All of their questions been addressed.  Following the decision to proceed with transcatheter aortic valve replacement, a discussion has been held regarding what types of management strategies would be attempted intraoperatively in the event of life-threatening complications, including whether or not the patient would be considered a candidate for the use of cardiopulmonary bypass and/or conversion to open sternotomy for attempted surgical intervention.  We plan to proceed with transcatheter aortic valve replacement via the transapical approach as an alternative to very high-risk surgery on Tuesday, 01/15/2013.  We will additionally plan to obtain arterial access from the right axillary artery at the time of surgery in the event that temporary use of cardiopulmonary bypass is required intraoperatively.  The patient has been advised of a variety of complications that might develop including but not limited to risks of death, stroke, paravalvular leak, aortic dissection or other major vascular complications, aortic annulus rupture, device embolization, cardiac rupture or perforation, mitral regurgitation, acute myocardial infarction, arrhythmia, heart block or bradycardia requiring permanent pacemaker placement, congestive heart failure, respiratory failure,  renal  failure, pneumonia, infection, other late complications related to structural valve deterioration or migration, or other complications that might ultimately cause a temporary or permanent loss of functional independence or other long term morbidity.  The patient provides full informed consent for the procedure as described and all questions were answered.     Salvatore Decent. Cornelius Moras, MD 01/01/2013 4:31 PM

## 2013-01-01 NOTE — Telephone Encounter (Signed)
Dr Excell Seltzer is going to see the pt today during appointment with Dr Cornelius Moras.

## 2013-01-01 NOTE — Patient Instructions (Addendum)
Stop taking methotrexate between now and surgery

## 2013-01-01 NOTE — Progress Notes (Unsigned)
[address]       [place] [address]             [phone]        HPI:  The patient is an 79 year old gentleman with a history of severe rheumatoid arthritis on prednisone and methotrexate, poor mobility due to this and history of bilateral TKR and left hip fracture repair who underwent CABG x 4 by Dr. Tyrone Sage on 01/26/1998. He had a LIMA to the LAD and SVG's to the OM, D1 and RCA. He developed recurrent angina in 2004 and underwent cardiac catheterization on 09/25/2002 showing a chronically occluded saphenous vein graft to the right coronary with all the other grafts patent.  There was a 99% stenosis in the distal LAD which was subsequently treated on 09/27/2002 with a drug-eluting stent placed through the left internal mammary artery into the distal LAD. The patient has been followed for aortic stenosis and I evaluated the patient on 04/20/2010 for progressive shortness of breath. His echocardiogram at that time showed an aortic valve area of 1 cm and his catheterization showed a peak to peak gradient across the aortic valve of 44 mm mercury with a mean gradient of only 25 mm mercury. His ejection fraction was 55%. The left internal mammary graft and saphenous vein grafts were patent with the exception of the chronically occluded saphenous vein graft to the right coronary artery. The native right coronary had significant diffuse disease within it. I did not feel the patient was an operative candidate at that time due to his severe debilitation. I did not feel that his moderate aortic stenosis was bad enough to warrant consideration of transcatheter aortic valve replacement. The patient now reports that over the last 6-12 months he has had progressive exertional dyspnea and fatigue that occurs with walking short distances in his house. He has had occasional episodes of chest discomfort. He has had orthopnea and has to sleep with 3 pillows. He denies dizziness and syncope.  A recent echocardiogram shows progression of his aortic stenosis into the severe range with an aortic valve area of 0.43 cm, a mean gradient of 40 mm mercury, and a peak gradient of 65 mm mercury. Left ventricular ejection fraction is 50-55%. He returns today to discuss the results of his cardiac CT, CT of the chest, abdomen and pelvis, and cardiac cath. His symptoms of exertional dyspnea and orthopnea are unchanged. His PFT's show minimal obstructive disease with a corrected diffusion capacity of 81%.     Current Outpatient Prescriptions  Medication Sig Dispense Refill  . acetaminophen (TYLENOL) 500 MG tablet Take 1,000 mg by mouth 2 (two) times daily.       Marland Kitchen amLODipine (NORVASC) 5 MG tablet Take 5 mg by mouth daily.      Marland Kitchen aspirin 325 MG EC tablet Take 325 mg by mouth daily.      . diazepam (VALIUM) 5 MG tablet Take 5 mg by mouth daily as needed for anxiety.      . folic acid (FOLVITE) 1 MG tablet Take 3 mg by mouth daily.      Marland Kitchen levothyroxine (SYNTHROID, LEVOTHROID) 25 MCG tablet Take 25 mcg by mouth 4 (four) times a week. Monday, Wednesday,Friday, and Saturday      . methotrexate 2.5 MG tablet Take 25 mg by mouth every Saturday.       . metoprolol tartrate (LOPRESSOR) 25 MG tablet Take 12.5 mg by mouth daily.      Marland Kitchen  nitroGLYCERIN (NITROSTAT) 0.4 MG SL tablet Place 0.4 mg under the tongue every 5 (five) minutes as needed for chest pain.      Marland Kitchen omeprazole (PRILOSEC) 20 MG capsule Take 20 mg by mouth daily.      . predniSONE (DELTASONE) 5 MG tablet Take 5 mg by mouth daily.      . simvastatin (ZOCOR) 20 MG tablet Take 20 mg by mouth every evening.       No current facility-administered medications for this visit.     Physical Exam:  General: Elderly but well-appearing  HEENT: Unremarkable  Neck: no JVD, no bruits, no adenopathy, transmitted murmur to both sides of neck  Chest: clear to auscultation, symmetrical breath sounds, no wheezes, no rhonchi  CV: RRR, grade III/VI  crescendo/decrescendo murmur heard best at the aorta, no diastolic murmur  Abdomen: soft, non-tender, no masses  Extremities: warm, well-perfused, pulses palpable in feet, mile LE edema to mid calf   Diagnostic Tests:  Reyes Ivan, MD Caleen Essex Dec 28, 2012 4:20:57 PM EDT       ADDENDUM REPORT: 12/28/2012 16:18  ADDENDUM:  OVER-READ INTERPRETATION CT CHEST  The following report is an over-read performed by radiologist Dr.  Leanna Battles of [facility] on Creation date. This  over-read does not include interpretation of cardiac or coronary  anatomy or pathology. The CTA interpretation by the cardiologist is  attached.  COMPARISON: COMPARISON  CT chest 04/26/2010.  FINDINGS:  FINDINGS  No pathologically enlarged mediastinal lymph nodes. Right hilar  lymph node measures 1.2 cm and is likely unchanged. Atherosclerotic  calcification of the arterial vasculature. Small hiatal hernia.  Visualized portions of the lungs show scattered atelectasis or  scarring. Visualized portions of the airway, upper abdomen and bones  are unremarkable.  IMPRESSION:  IMPRESSION  No acute extracardiac findings.  Electronically Signed  By: Leanna Battles M.D.  On: 12/28/2012 16:18       Study Result    CLINICAL DATA: Severe AS Pre TAVR Planning  EXAM:  Cardiac TAVR CT  TECHNIQUE:  The patient was scanned on a Philips 256 scanner. A 120 kV  retrospective scan was triggered in the descending thoracic aorta at  111 HU's. Gantry rotation speed was 270 msec's and collimation was  .9 mm. No beta blockade or nitro were given. The 3 D data set was  reconstructed in 5% intervals of the R-R cycle. Systolic and  diastolic phases were analyzed on a dedicated work station using  MPR, MIP and VRT modes. The patient received 80 cc of contrast.  FINDINGS:  Aortic Valve: The aortic valve was trileaflet. There was severe  calcification of all leaflet tips. There was calcification of the  annulus  at the base of the right coronary cusp. There was minimal  calcification of the sinotubular junction and no calcification of  the intervalvular fibrosa  Aorta: The ascending aorta has scattered calcification. The arch has  scattered calcification There is an area near the take off of the  left subclavian that has somewhat bulky soft plaque/mural thrombus  No severe tortuosity, aneurysm or dissection  Sinotubular Junction: 27.8 mm  Ascending Thoracic Aorta: 29.7 mm  Aortic Arch: 22.8 mm  Descending Thoracic Aorta: 22 mm  Sinus of Valsalva Measurements:  Non-coronary: 34 mm  Right -coronary: 32 mm  Left -coronary: 30.5 mm  Coronary Artery Height above Annulus:  Left Main: 14mm above annulus  Right Coronary: 19 mm above annulus  Virtual Basal Annulus Measurements:  Maximum/Minimum Diameter:  31.81mm and 23.5 mm  Perimeter: 87 mm  Area: 590 mm 40% phase  Coronary Arteries: Heavily calcified: LM non obstructive. LAD  possibly occluded in mid vessel fills from LIMA. Circumflex  occluded. RCA occluded distally. SVG to RCA occluded SVG to Diagonal  patent, SVG to OM patent LIMA patent to LAD.  IMPRESSION:  1) Virtual basal annulus suitable for a fully inflated 29mm Sapien  XT valve  2) Somewhat bulky mural plaque in the aortic arch near the left  subclavian take off that may pose an embolic risk for transfemoral  approach  3) Occluded RCA graft with patent LIMA, and SVG;s to OM and Diagonal  Charlton Haws  Electronically Signed:  By: Charlton Haws  On: 12/26/2012 18:17     CLINICAL DATA: 79 year old male with history of severe aortic  stenosis. Preprocedural study for potential transcatheter thoracic  aortic valve replacement (TAVR) procedure.  EXAM:  CT ANGIOGRAPHY ABDOMEN AND PELVIS WITH CONTRAST AND WITHOUT CONTRAST  TECHNIQUE:  Multidetector CT imaging of the abdomen and pelvis was performed  using the standard protocol during bolus administration of  intravenous contrast.  Multiplanar reconstructed images including  MIPs were obtained and reviewed to evaluate the vascular anatomy.  CONTRAST: OMNIPAQUE IOHEXOL 350 MG/ML SOLN  COMPARISON: No priors.  FINDINGS:  CT ABDOMEN AND PELVIS FINDINGS  Abdomen/Pelvis: The appearance of the liver, gallbladder, pancreas,  spleen and bilateral adrenal glands is unremarkable. There is  multifocal cortical thinning in the kidneys bilaterally, favored to  represent mild post infectious or inflammatory scarring. Mild  bilateral renal atrophy (left greater than right). Multiple  subcentimeter low attenuation lesions are seen in the kidneys  bilaterally, too small to definitively characterize.  Extensive atherosclerosis of the abdominal and pelvic vasculature,  with vascular measurements pertinent to potential TAVR procedure, as  below. There is fusiform aneurysmal dilatation of the distal  infrarenal abdominal aorta which measures up to 4.4 x 4.7 cm at the  level of the aortic bifurcation. This aneurysmal dilatation extends  into both of the common iliac arteries, with the right common iliac  artery measuring up to 4.6 cm in diameter and the left common iliac  artery measuring up to 4.0 cm in diameter respectively. This  aneurysmal dilatation further extends into the right internal iliac  artery distribution, with the right internal iliac artery measuring  up to 4.5 cm in diameter. There is also mild aneurysmal dilatation  of the proximal left external iliac artery which measures up to 2.3  cm in diameter. All of these vessels are markedly tortuous, and  there is extensive eccentric nonenhancing material associated with  the areas of aneurysmal dilatation, compatible with extensive mural  thrombus. Aneurysmal dilatation of the proximal right common femoral  artery is also noted measuring up to 2.2 cm in diameter. Left  superficial femoral artery appears completely occluded shortly after  its origin.  Evaluation of  the anatomic pelvis is limited by beam hardening  artifact from patient's left-sided total hip arthroplasty. With  these limitations in mind, there is no significant volume of  ascites. No pneumoperitoneum. No pathologic distention of small  bowel. Numerous colonic diverticula are noted, most pronounced in  the distal descending colon and proximal sigmoid colon, without  surrounding inflammatory changes to suggest an acute diverticulitis  at this time. Small ventral hernia in the epigastric region  containing only a small amount of omental fat. No evidence of  associated bowel incarceration or obstruction. No definite  pathologic lymphadenopathy identified  within the abdomen or pelvis.  Musculoskeletal: There are no aggressive appearing lytic or blastic  lesions noted in the visualized portions of the skeleton.  VASCULAR MEASUREMENTS PERTINENT TO TAVR:  AORTA:  Minimal Aortic Diameter - 2.0 x 1.6 cm  Severity of Aortic Calcification - moderate  RIGHT PELVIS:  Right Common Iliac Artery -  Minimal Diameter - 18.3 x 16.9 mm  Tortuosity - mild  Calcification - moderate  Right External Iliac Artery -  Minimal Diameter - 12.3 x 7.2 mm  Tortuosity - severe  Calcification - moderate  Right Common Femoral Artery -  Minimal Diameter - 11.8 x 11.7 mm  Tortuosity - mild  Calcification - moderate  LEFT PELVIS:  Left Common Iliac Artery -  Minimal Diameter - 17.7 x 18.9 mm  Tortuosity - mild  Calcification - moderate  Left External Iliac Artery -  Minimal Diameter - 11.2 x 6.3 mm  Tortuosity - severe  Calcification - moderate  Left Common Femoral Artery -  Minimal Diameter - 8.5 x 7.5 mm  Tortuosity - mild  Calcification - mild  Review of the MIP images confirms the above findings.  IMPRESSION:  1. Findings and measurements pertinent to potential TAVR procedure,  as discussed above. Although the calibers of the pelvic arterial  system is large enough to accommodate the devices, the  extreme  tortuosity of the external iliac arteries bilaterally, the  multifocal aneurysmal dilatation, and the extensive atheromatous  plaque and mural thrombus throughout the systems makes pelvic  arterial access challenging.  2. In addition to the vascular findings pertinent to potential TAVR,  there is extensive aneurysm formation throughout the distal  infrarenal abdominal aorta as well as the pelvic vasculature  bilaterally, as detailed above. As well, there is complete occlusion  of the proximal left superficial femoral artery. Throughout these  areas of aneurysmal dilatation there is extensive mural thrombus and  atheromatous plaque.  3. Colonic diverticulosis without findings to suggest acute  diverticulitis at this time.  4. Additional incidental findings, as above.  Electronically Signed  By: Trudie Reed M.D.  On: 12/31/2012 11:47     Diagnostic Left hand, Right Heart Catheterization with Coronary and Bypass Graft Angiography Report  JERRIT HOREN  79 y.o.  male  22-Oct-1927  Procedure Date: 12/20/2012  Referring Physician: Casimiro Needle Cooper/ [facility]  Primary Cardiologist:: Pre TAVR evaluation  PROCEDURE: Left and right heart catheterization with selective coronary angiography, bypass graft angiography, left ventriculogram.  INDICATIONS: The patient is being considered for TAVR.  The risks, benefits, and details of the procedure were explained to the patient. The patient verbalized understanding and wanted to proceed. Informed written consent was obtained.  PROCEDURE TECHNIQUE: After Xylocaine anesthesia a 6 French sheath was placed in the right femoral artery and a 7 French sheath in the right femoral vein with a single anterior needle wall stick. There is heavy calcification and tortuosity within the distal aortoiliac and femoral vascular territory. Right heart catheterization was performed with a Swan-Ganz . Both thermodilution and Fick cardiac output determinations  were made. Left heart catheterization was performed using an A2 MP 6 French catheter. The saphenous vein grafts were in much with this catheter. A 6 [place] JR 4 was used for right coronary angiography, and also used to attempt crossing the aortic valve. We were successful in crossing the valve with a straight 0.036 wire high out of the 6 [place] Judkins right catheter would not advance through the valve. Since we knew that the  valve is extremely tight, further attempts to cross the valve were aborted. Prior crossing the valve. We did give 2000 units of IV heparin. Left coronary angiography was done using a 6 [place] JL4 diagnostic catheter. Left ventriculography was not performed as we were unable to advance the Judkins right catheter across the aortic valve after we entered the ventricle with the guidewire. No complications occurred.  CONTRAST: Total of 170 cc.  COMPLICATIONS: None.  HEMODYNAMICS: Aortic pressure was 144/69 mmHg; LV pressure was not recorded; LVEDP not recorded; right atrial mean 6 mm mercury; right ventricle 28/6 mmHg; PA pressure 23/8 mmHg; pulmonary capillary wedge mean 8 mm mercury; cardiac output 4.11 L per minute (Thermal), and 5.46 L per minute (Fick); Aortic Valve gradient was not recorded.  ANGIOGRAPHIC DATA: The left main coronary artery is severe distal disease.  The left anterior descending artery is competitive flow with the patent LIMA.  The left circumflex artery is totally occluded.  The right coronary artery is totally occluded distally with severe proximal and mid disease.  BYPASS GRAFT ANGIOGRAPHY:  SVG to diagonal: Ectatic, but widely patent.  SVG to obtuse marginal: Ectatic, but widely patent.  Saphenous vein graft to the distal right coronary: Totally occluded at the aorto ostial junction.  LIMA to LAD: Widely patent  LEFT VENTRICULOGRAM: Not performed. A Judkins, right diagnostic catheter would not advance across the aortic valve. After crossing the valve with a  straight tipped 0.036 mm wire .  IMPRESSIONS: 1. Total occlusion of the saphenous vein graft to the right coronary. This is been previously documented.  2. Widely patent saphenous vein graft to the diagonal, obtuse marginal, and the LIMA to the LAD.  3. Normal pulmonary artery pressures.  4. Ectatic and calcified right aortoiliac anatomy.  5. Previously documented severe/critical aortic stenosis, , documented by echo and suggested by this cath as a diagnostic catheter would not cross the valve.  RECOMMENDATION: He will be referred back to the valve clinic for further workup and hopefully progression to TAVR for critical AS.        Impression:  The patient's annular area was measured at 590 mm2, suitable for a 29 mm Sapien XT valve. He has severe aortoiliac disease with bilateral iliac artery aneurysms measuring almost 5 cm on the right with marked calcification and tortuosity. He is not a candidate for transfemoral approach and since he has had prior coronary bypass surgery a transapical approach would be best. Dr. Cornelius Moras and I have reviewed the scans and feel that we should expose the right axillary artery and sew a graft onto it before deploying the transcatheter valve in case we need to put the patient on cardiopulmonary bypass emergently. His iliac artery disease would preclude using the femoral arterial cannulation to put him on bypass. His cardiac catheterization shows patent saphenous vein grafts to the optional diagonal and obtuse marginal branches as well as a patent LIMA to the LAD. The saphenous vein graft to the right coronary artery is chronically occluded. The native right coronary artery is open but diffusely diseased. I don't think this will be significant since he has had no anginal symptoms.   During the course of the patient's preoperative work up he and his wife have been evaluated comprehensively by a multidisciplinary team of specialists coordinated through the Multidisciplinary  Heart Valve Clinic in the [facility].  They have been demonstrated to suffer from symptomatic severe aortic stenosis as noted above. The patient has been counseled extensively  as to the relative risks and benefits of all options for the treatment of severe aortic stenosis including long term medical therapy, conventional surgery for aortic valve replacement, and transcatheter aortic valve replacement.  The patient has been independently evaluated by two cardiac surgeons including myself and Dr. Cornelius Moras, and Dr. Verdis Prime and Dr. Tonny Bollman and they all felt he would be at high risk for conventional surgical aortic valve replacement based upon a predicted risk of mortality using the Society of Thoracic Surgeons risk calculator of 9.3 %. All of [place] feel that the patient would be a poor candidate for conventional surgery (predicted risk of mortality >15% and/or predicted risk of permanent morbidity >50%) because of comorbidities including advanced age, severe steroid and methotrexate dependent rheumatoid arthritis, and prior coronary bypass surgery.    Based upon review of all of the patient's preoperative diagnostic tests they are felt to be candidate for transcatheter aortic valve replacement using the transapical approach as an alternative to high risk conventional surgery.    Following the decision to proceed with transcatheter aortic valve replacement, a discussion has been held regarding what types of management strategies would be attempted intraoperatively in the event of life-threatening complications, including whether or not the patient would be considered a candidate for the use of cardiopulmonary bypass and/or conversion to open sternotomy for attempted surgical intervention.  The patient has been advised of a variety of complications that might develop including but not limited to risks of death, stroke, paravalvular leak, aortic dissection or other major vascular  complications, aortic annulus rupture, device embolization, cardiac rupture or perforation, mitral regurgitation, acute myocardial infarction, arrhythmia, heart block or bradycardia requiring permanent pacemaker placement, congestive heart failure, respiratory failure, renal failure, pneumonia, infection, other late complications related to structural valve deterioration or migration, or other complications that might ultimately cause a temporary or permanent loss of functional independence or other long term morbidity.  The patient provides full informed consent for the procedure as described and all questions were answered.   Plan:  Transapical TAVR on 01/15/2013

## 2013-01-02 ENCOUNTER — Other Ambulatory Visit: Payer: Self-pay | Admitting: *Deleted

## 2013-01-02 DIAGNOSIS — I359 Nonrheumatic aortic valve disorder, unspecified: Secondary | ICD-10-CM

## 2013-01-09 ENCOUNTER — Encounter (HOSPITAL_COMMUNITY): Payer: Self-pay | Admitting: Pharmacy Technician

## 2013-01-11 ENCOUNTER — Encounter (HOSPITAL_COMMUNITY): Payer: Self-pay

## 2013-01-11 ENCOUNTER — Other Ambulatory Visit (HOSPITAL_COMMUNITY): Payer: Medicare Other

## 2013-01-11 ENCOUNTER — Encounter (HOSPITAL_COMMUNITY)
Admission: RE | Admit: 2013-01-11 | Discharge: 2013-01-11 | Disposition: A | Discharge status: Home / Self Care | Payer: Medicare Other | Source: Ambulatory Visit | Attending: Surgery | Admitting: Surgery

## 2013-01-11 VITALS — BP 143/76 | HR 63 | Temp 98.6°F | Resp 18 | Ht 66.0 in | Wt 184.6 lb

## 2013-01-11 DIAGNOSIS — I359 Nonrheumatic aortic valve disorder, unspecified: Secondary | ICD-10-CM

## 2013-01-11 DIAGNOSIS — Z01818 Encounter for other preprocedural examination: Secondary | ICD-10-CM | POA: Insufficient documentation

## 2013-01-11 DIAGNOSIS — Z01812 Encounter for preprocedural laboratory examination: Secondary | ICD-10-CM | POA: Insufficient documentation

## 2013-01-11 DIAGNOSIS — Z01810 Encounter for preprocedural cardiovascular examination: Secondary | ICD-10-CM | POA: Insufficient documentation

## 2013-01-11 HISTORY — DX: Hypothyroidism, unspecified: E03.9

## 2013-01-11 HISTORY — DX: Cerebral infarction, unspecified: I63.9

## 2013-01-11 LAB — COMPREHENSIVE METABOLIC PANEL
ALT: 11 U/L (ref 0–53)
AST: 23 U/L (ref 0–37)
Calcium: 9 mg/dL (ref 8.4–10.5)
Chloride: 89 mEq/L — ABNORMAL LOW (ref 96–112)
Creatinine, Ser: 0.89 mg/dL (ref 0.50–1.35)
GFR calc Af Amer: 89 mL/min — ABNORMAL LOW (ref >90)
GFR calc non Af Amer: 77 mL/min — ABNORMAL LOW (ref >90)
Glucose, Bld: 105 mg/dL — ABNORMAL HIGH (ref 70–99)
Sodium: 125 mEq/L — ABNORMAL LOW (ref 135–145)
Total Protein: 7.2 g/dL (ref 6.0–8.3)

## 2013-01-11 LAB — URINALYSIS, ROUTINE W REFLEX MICROSCOPIC
Hgb urine dipstick: NEGATIVE
Nitrite: NEGATIVE
Protein, ur: NEGATIVE mg/dL
Specific Gravity, Urine: 1.02 (ref 1.005–1.030)
Urobilinogen, UA: 0.2 mg/dL (ref 0.0–1.0)
pH: 5.5 (ref 5.0–8.0)

## 2013-01-11 LAB — PROTIME-INR
INR: 1.08 (ref 0.00–1.49)
Prothrombin Time: 13.8 seconds (ref 11.6–15.2)

## 2013-01-11 LAB — BLOOD GAS, ARTERIAL
Acid-Base Excess: 0.1 mmol/L (ref 0.0–2.0)
Drawn by: 206361
FIO2: 0.21 %
O2 Saturation: 96.7 %
Patient temperature: 98.6
pH, Arterial: 7.435 (ref 7.350–7.450)
pO2, Arterial: 77.5 mmHg — ABNORMAL LOW (ref 80.0–100.0)

## 2013-01-11 LAB — CBC
MCH: 31.1 pg (ref 26.0–34.0)
MCHC: 34.1 g/dL (ref 30.0–36.0)
MCV: 91.4 fL (ref 78.0–100.0)
Platelets: 186 10*3/uL (ref 150–400)
RBC: 4.05 MIL/uL — ABNORMAL LOW (ref 4.22–5.81)

## 2013-01-11 LAB — APTT: aPTT: 31 seconds (ref 24–37)

## 2013-01-11 LAB — SURGICAL PCR SCREEN: Staphylococcus aureus: POSITIVE — AB

## 2013-01-11 LAB — HEMOGLOBIN A1C: Mean Plasma Glucose: 126 mg/dL — ABNORMAL HIGH (ref <117)

## 2013-01-11 NOTE — Progress Notes (Signed)
01/11/13 1331  OBSTRUCTIVE SLEEP APNEA  Have you ever been diagnosed with sleep apnea through a sleep study? No  Do you snore loudly (loud enough to be heard through closed doors)?  1  Do you often feel tired, fatigued, or sleepy during the daytime? 1  Has anyone observed you stop breathing during your sleep? 0  Do you have, or are you being treated for high blood pressure? 1  BMI more than 35 kg/m2? 0  Age over 77 years old? 1  Neck circumference greater than 40 cm/18 inches? 0  Obstructive Sleep Apnea Score 4  Score 4 or greater  Results sent to PCP

## 2013-01-11 NOTE — Progress Notes (Signed)
PCP is Dr. Theressa Millard Cardiologist is Dr Verdis Prime Echo noted in Epic 12-11-12 Card cath noted in epic 12-20-12 PFT noted in epic 12-26-12

## 2013-01-11 NOTE — Pre-Procedure Instructions (Signed)
[place]  01/11/2013   Your procedure is scheduled on:  Oct 21@0745   Report to [facility]  2 * 3 at [phone].  Call this number if you have problems the morning of surgery: [phone]   Remember:   Do not eat food or drink liquids after midnight.   Take these medicines the morning of surgery with A SIP OF WATER: Amlodiopine (Norvasc), Diazepam (Valium) if needed, Metoprolol tartrate (lopressor),  And Omeprazole (Prilosec).  Stop taking Aleve,  Ibuprofen, BC's, Goody's, Fish Oil, and Herbal medications. Take Aspirin as directed by your Dr.    Drucilla Schmidt not wear jewelry, make-up or nail polish.  Do not wear lotions, powders, or perfumes. You may wear deodorant.  Do not shave 48 hours prior to surgery. Men may shave face and neck.  Do not bring valuables to the hospital.  [facility] is not responsible                  for any belongings or valuables.               Contacts, dentures or bridgework may not be worn into surgery.  Leave suitcase in the car. After surgery it may be brought to your room.  For patients admitted to the hospital, discharge time is determined by your                treatment team.               Patients discharged the day of surgery will not be allowed to drive  home.    Special Instructions: Shower using CHG 2 nights before surgery and the night before surgery.  If you shower the day of surgery use CHG.  Use special wash - you have one bottle of CHG for all showers.  You should use approximately 1/3 of the bottle for each shower.   Please read over the following fact sheets that you were given: Pain Booklet, Coughing and Deep Breathing, Blood Transfusion Information, Open Heart Packet, MRSA Information and Surgical Site Infection Prevention

## 2013-01-13 LAB — ABO/RH: ABO/RH(D): B POS

## 2013-01-14 ENCOUNTER — Other Ambulatory Visit: Payer: Self-pay | Admitting: *Deleted

## 2013-01-14 DIAGNOSIS — A4901 Methicillin susceptible Staphylococcus aureus infection, unspecified site: Secondary | ICD-10-CM

## 2013-01-14 MED ORDER — NITROGLYCERIN IN D5W 200-5 MCG/ML-% IV SOLN
2.0000 ug/min | INTRAVENOUS | Status: DC
  Filled 2013-01-14: qty 250

## 2013-01-14 MED ORDER — DEXTROSE 5 % IV SOLN
1.5000 g | INTRAVENOUS | Status: AC
  Administered 2013-01-15: 1.5 g via INTRAVENOUS
  Filled 2013-01-14 (×2): qty 1.5

## 2013-01-14 MED ORDER — NOREPINEPHRINE BITARTRATE 1 MG/ML IJ SOLN
0.0000 ug/min | INTRAVENOUS | Status: DC
  Filled 2013-01-14: qty 8

## 2013-01-14 MED ORDER — MAGNESIUM SULFATE 50 % IJ SOLN
40.0000 meq | INTRAMUSCULAR | Status: DC
  Filled 2013-01-14: qty 10

## 2013-01-14 MED ORDER — EPINEPHRINE HCL 1 MG/ML IJ SOLN
0.5000 ug/min | INTRAVENOUS | Status: DC
  Filled 2013-01-14: qty 4

## 2013-01-14 MED ORDER — MUPIROCIN 2 % EX OINT: TOPICAL_OINTMENT | Freq: Three times a day (TID) | CUTANEOUS | Status: DC

## 2013-01-14 MED ORDER — DOPAMINE-DEXTROSE 3.2-5 MG/ML-% IV SOLN
2.0000 ug/kg/min | INTRAVENOUS | Status: DC
  Filled 2013-01-14: qty 250

## 2013-01-14 MED ORDER — SODIUM CHLORIDE 0.9 % IV SOLN
INTRAVENOUS | Status: DC
  Filled 2013-01-14: qty 30

## 2013-01-14 MED ORDER — POTASSIUM CHLORIDE 2 MEQ/ML IV SOLN
80.0000 meq | INTRAVENOUS | Status: DC
  Filled 2013-01-14: qty 40

## 2013-01-14 MED ORDER — DEXMEDETOMIDINE HCL IN NACL 400 MCG/100ML IV SOLN
0.1000 ug/kg/h | INTRAVENOUS | Status: AC
  Administered 2013-01-15: 0.2 ug/kg/h via INTRAVENOUS
  Filled 2013-01-14: qty 100

## 2013-01-14 MED ORDER — SODIUM CHLORIDE 0.9 % IV SOLN
INTRAVENOUS | Status: AC
  Administered 2013-01-15: 1.3 [IU]/h via INTRAVENOUS
  Filled 2013-01-14: qty 1

## 2013-01-14 MED ORDER — PHENYLEPHRINE HCL 10 MG/ML IJ SOLN
30.0000 ug/min | INTRAVENOUS | Status: AC
  Administered 2013-01-15: 50 ug/min via INTRAVENOUS
  Filled 2013-01-14: qty 2

## 2013-01-14 MED ORDER — SODIUM CHLORIDE 0.9 % IV SOLN
1250.0000 mg | INTRAVENOUS | Status: AC
  Administered 2013-01-15: 1250 mg via INTRAVENOUS
  Filled 2013-01-14 (×2): qty 1250

## 2013-01-14 NOTE — Progress Notes (Signed)
Anesthesia chart review:  Patient is a 79 year old male scheduled for TVAR, transapical approach on 01/15/13 by Dr. Laneta Simmers.  History includes former smoker, HTN, GERD, severe AS, BPH s/p TURP, CAD s/p CABG '99 and stent to LIMA to LAD '04, CHF, RA,  hypothyroidism, CVA (left retinal artery embolus), known left ICA occlusion and s/p right CEA '97, 4.7 cm infrarenal AAA extending to both CIA measuring 4.6 cm right CIA/4.5 cm right IIA and 4.0 cm left CIA/2.3 left EIA and 2.2 cm right CFA aneursym (12/26/12 CTA), former smoker. PCP is Dr. Earl Gala. Cardiologist is Dr. Katrinka Blazing.  EKG on 01/11/13 showed SR with first degree AVB, moderate voltage for LVH, inferior infarct (age undetermined).  Echo on 12/11/12 showed: - Left ventricle: The cavity size was normal. Wall thickness was increased in a pattern of moderate LVH. Systolic function was normal. The estimated ejection fraction was in the range of 50% to 55%. Doppler parameters are consistent with abnormal left ventricular relaxation (grade 1 diastolic dysfunction). - Aortic valve: Moderately calcified annulus. Severely thickened, severely calcified leaflets. Transvalvular velocity was increased, due to stenosis. There was critical stenosis. Valve area: 0.43cm^2(VTI). Valve area: 0.43cm^2 (Vmax). - Mitral valve: Calcified annulus. - Left atrium: The atrium was moderately dilated.  Cardiac/TAVR CTA on 12/26/12 showed: 1) Virtual basal annulus suitable for a fully inflated 29mm Sapien XT valve  2) Somewhat bulky mural plaque in the aortic arch near the left subclavian take off that may pose an embolic risk for transfemoral approach  3) Occluded RCA graft with patent LIMA, and SVG;s to OM and Diagonal   Cardiac cath on 12/20/12 showed: 1. Total occlusion of the saphenous vein graft to the right coronary. This is been previously documented.  2. Widely patent saphenous vein graft to the diagonal, obtuse marginal, and the LIMA to the LAD.  3. Normal pulmonary  artery pressures.  4. Ectatic and calcified right aortoiliac anatomy.  5. Previously documented severe/critical aortic stenosis, , documented by echo and suggested by this cath as a diagnostic catheter would not cross the valve  CXR on 01/11/13 showed: No acute cardiopulmonary process.  PFTs of 12/26/12 showed FCA 3.15 (107%), FEV1 2.26 (113%), DLCOunc 17.91 (73%).  Labs noted.  Na 125. Cl 89.  He is no currently on any diuretic therapy.  Around 10 AM, I called and spoke with Darius Bump, RN at [facility] to report hyponatremia results to have Dr. Laneta Simmers review.  It appears patient has had issues with hyponatremia in the past, as Na+ results on 12/17/12 and 11/09/10 were both 128.  She did say that her surgeons had made some communication with vascular surgeon Dr. Edilia Bo regarding aneurysm follow-up which would need to be arranged post-operatively--also for further follow-up of known carotid occlusive disease. Ryan sent me staff message this afternoon today that Dr. Laneta Simmers was "okay" with labs.  I'll order an ISTAT for the day of surgery to re-evaluate for hyponatremia.  Velna Ochs [facility] Short Stay Center/Anesthesiology Phone [phone] 01/14/2013 4:28 PM

## 2013-01-14 NOTE — H&P (Addendum)
[address]       [place] [address]             [phone]        CARDIOTHORACIC SURGERY HISTORY AND PHYSICAL   HPI:  The patient is an 79 year old gentleman with a history of severe rheumatoid arthritis on prednisone and methotrexate, poor mobility due to this and history of bilateral TKR and left hip fracture repair who underwent CABG x 4 by Dr. Tyrone Sage on 01/26/1998. He had a LIMA to the LAD and SVG's to the OM, D1 and RCA. He developed recurrent angina in 2004 and underwent cardiac catheterization on 09/25/2002 showing a chronically occluded saphenous vein graft to the right coronary with all the other grafts patent.  There was a 99% stenosis in the distal LAD which was subsequently treated on 09/27/2002 with a drug-eluting stent placed through the left internal mammary artery into the distal LAD. The patient has been followed for aortic stenosis and I evaluated the patient on 04/20/2010 for progressive shortness of breath. His echocardiogram at that time showed an aortic valve area of 1 cm and his catheterization showed a peak to peak gradient across the aortic valve of 44 mm mercury with a mean gradient of only 25 mm mercury. His ejection fraction was 55%. The left internal mammary graft and saphenous vein grafts were patent with the exception of the chronically occluded saphenous vein graft to the right coronary artery. The native right coronary had significant diffuse disease within it. I did not feel the patient was an operative candidate at that time due to his severe debilitation. I did not feel that his moderate aortic stenosis was bad enough to warrant consideration of transcatheter aortic valve replacement. The patient now reports that over the last 6-12 months he has had progressive exertional dyspnea and fatigue that occurs with walking short distances in his house. He has had occasional episodes of chest discomfort. He has had orthopnea and has to sleep  with 3 pillows. He denies dizziness and syncope. A recent echocardiogram shows progression of his aortic stenosis into the severe range with an aortic valve area of 0.43 cm, a mean gradient of 40 mm mercury, and a peak gradient of 65 mm mercury. Left ventricular ejection fraction is 50-55%. He returns today to discuss the results of his cardiac CT, CT of the chest, abdomen and pelvis, and cardiac cath. His symptoms of exertional dyspnea and orthopnea are unchanged. His PFT's show minimal obstructive disease with a corrected diffusion capacity of 81%.    Past Medical History   Diagnosis  Date   .  Hypertension    .  MVP (mitral valve prolapse)    .  GERD (gastroesophageal reflux disease)    .  Thyroid disease      HYPOTHYROIDISM   .  Aortic stenosis, severe    .  Embolism involving retinal artery    .  BPH (benign prostatic hypertrophy)      Dr. Retta Diones   .  Iliac artery aneurysm      Dr. Edilia Bo   .  AAA (abdominal aortic aneurysm)      Dr. Ashley Royalty   .  Diastolic heart failure     Past Surgical History   Procedure  Laterality  Date   .  Left hip hemiarthropasty       AFTER HIP FX   .  Coronary artery bypass graft  1999..Dr. Sheliah Plane   .  Carotid endarterectomy     .  Total knee arthroplasty       right 1992 left 2002   .  Biopsy prostate       2005   .  Prostate surgery   2012     TURP   .  Joint replacement   2007     THR   .  Harvest bone graft   1982     FOR THE ANKLE   .  Skin grafts       1968   Family Hx: His father died at age 71 after abdominal aortic aneurysm repair. His mother died age 62 with a myocardial infarction. One brother died at age 63 with myocardial infarction.  History    Social History   .  Marital Status:  Married     Spouse Name:  N/A     Number of Children:  N/A   .  Years of Education:  N/A    Occupational History   .  retired    Social History Main Topics   .  Smoking status:  Quit in 1960   .  Smokeless tobacco:  no     .  Alcohol Use:  occasional   .  Drug Use:  no   .  Sexual Activity:  no    Other Topics  Concern   .  Not on file    Social History Narrative   .  No narrative on file     Current Outpatient Prescriptions   Medication  Sig  Dispense  Refill   .  acetaminophen (TYLENOL) 500 MG tablet  Take 1,000 mg by mouth 2 (two) times daily.     Marland Kitchen  amLODipine (NORVASC) 5 MG tablet  Take 5 mg by mouth daily.     Marland Kitchen  aspirin 325 MG EC tablet  Take 325 mg by mouth daily.     .  diazepam (VALIUM) 5 MG tablet  Take 5 mg by mouth daily as needed for anxiety.     .  folic acid (FOLVITE) 1 MG tablet  Take 3 mg by mouth daily.     Marland Kitchen  levothyroxine (SYNTHROID, LEVOTHROID) 25 MCG tablet  Take 25 mcg by mouth 4 (four) times a week. Monday, Wednesday,Friday, and Saturday     .  methotrexate 2.5 MG tablet  Take 25 mg by mouth every Saturday.     .  metoprolol tartrate (LOPRESSOR) 25 MG tablet  Take 12.5 mg by mouth daily.     .  nitroGLYCERIN (NITROSTAT) 0.4 MG SL tablet  Place 0.4 mg under the tongue every 5 (five) minutes as needed for chest pain.     Marland Kitchen  omeprazole (PRILOSEC) 20 MG capsule  Take 20 mg by mouth daily.     .  predniSONE (DELTASONE) 5 MG tablet  Take 5 mg by mouth daily.     .  simvastatin (ZOCOR) 20 MG tablet  Take 20 mg by mouth every evening.      No current facility-administered medications for this visit.    Review of Systems:  General: Good appetite, decrease energy, significant weight gain, no weight loss, no fever  Cardiac: Occasional chest pain with exertion, no chest pain at rest, marked SOB with minimal exertion, some resting SOB, no PND, 3 pillow orthopnea, no palpitations, no arrhythmia, no atrial fibrillation, mild LE edema, no dizzy spells, no syncope  Respiratory: Exertional shortness of breath, no  home oxygen, no productive cough, no dry cough, no bronchitis, no wheezing, no hemoptysis, no asthma, no pain with inspiration or cough, no sleep apnea, no CPAP at night  GI: No  difficulty swallowing, no reflux, no frequent heartburn, no hiatal hernia, no abdominal pain, no constipation, no diarrhea, no hematochezia, no hematemesis, no melena  GU: No dysuria, positive frequency, no urinary tract infection, no hematuria, positive enlarged prostate followed by Dr. Retta Diones, no kidney stones, and a kidney disease  Vascular: No pain suggestive of claudication, no pain in feet, no leg cramps, positive varicose veins, no DVT, no non-healing foot ulcer  Neuro: No stroke, no TIA's, no seizures, no headaches, blindness one eye, no slurred speech, no peripheral neuropathy, no chronic pain , plus instability of gait, mild memory/cognitive dysfunction  Musculoskeletal: Positive rheumatoid arthritis, positive joint swelling, positive myalgias, positive difficulty walking, decreased mobility  Skin: no rash, no itching, no skin infections, no pressure sores or ulcerations  Psych: no anxiety, no depression, no nervousness, no unusual recent stress  Eyes: positive blurry vision, no floaters, no recent vision changes, postive wears glasses or contacts  ENT: positive hearing loss, history of loose or painful teeth, no dentures, last saw dentist 6 months ago. Has appt in early Oct 2014.  Hematologic: positive easy bruising, no abnormal bleeding, no clotting disorder, no frequent epistaxis  Endocrine: borderline diabetes, does not check CBG's at home   Physical Exam:   General: Elderly but well-appearing  HEENT: Unremarkable  Neck: no JVD, no bruits, no adenopathy, transmitted murmur to both sides of neck  Chest: clear to auscultation, symmetrical breath sounds, no wheezes, no rhonchi  CV: RRR, grade III/VI crescendo/decrescendo murmur heard best at the aorta, no diastolic murmur  Abdomen: soft, non-tender, no masses  Extremities: warm, well-perfused, pulses palpable in feet, mile LE edema to mid calf  Diagnostic Tests:  Hunter Ivan, MD Caleen Essex Dec 28, 2012 4:20:57 PM EDT         ADDENDUM REPORT: 12/28/2012 16:18  ADDENDUM:  OVER-READ INTERPRETATION CT CHEST  The following report is an over-read performed by radiologist Dr.  Leanna Battles of [facility] on Creation date. This  over-read does not include interpretation of cardiac or coronary  anatomy or pathology. The CTA interpretation by the cardiologist is  attached.  COMPARISON: COMPARISON  CT chest 04/26/2010.  FINDINGS:  FINDINGS  No pathologically enlarged mediastinal lymph nodes. Right hilar  lymph node measures 1.2 cm and is likely unchanged. Atherosclerotic  calcification of the arterial vasculature. Small hiatal hernia.  Visualized portions of the lungs show scattered atelectasis or  scarring. Visualized portions of the airway, upper abdomen and bones  are unremarkable.  IMPRESSION:  IMPRESSION  No acute extracardiac findings.  Electronically Signed  By: Leanna Battles M.D.  On: 12/28/2012 16:18    CLINICAL DATA: Severe AS Pre TAVR Planning  EXAM:  Cardiac TAVR CT  TECHNIQUE:  The patient was scanned on a Philips 256 scanner. A 120 kV  retrospective scan was triggered in the descending thoracic aorta at  111 HU's. Gantry rotation speed was 270 msec's and collimation was  .9 mm. No beta blockade or nitro were given. The 3 D data set was  reconstructed in 5% intervals of the R-R cycle. Systolic and  diastolic phases were analyzed on a dedicated work station using  MPR, MIP and VRT modes. The patient received 80 cc of contrast.  FINDINGS:  Aortic Valve: The aortic valve was trileaflet. There was severe  calcification  of all leaflet tips. There was calcification of the  annulus at the base of the right coronary cusp. There was minimal  calcification of the sinotubular junction and no calcification of  the intervalvular fibrosa  Aorta: The ascending aorta has scattered calcification. The arch has  scattered calcification There is an area near the take off of the  left subclavian  that has somewhat bulky soft plaque/mural thrombus  No severe tortuosity, aneurysm or dissection  Sinotubular Junction: 27.8 mm  Ascending Thoracic Aorta: 29.7 mm  Aortic Arch: 22.8 mm  Descending Thoracic Aorta: 22 mm  Sinus of Valsalva Measurements:  Non-coronary: 34 mm  Right -coronary: 32 mm  Left -coronary: 30.5 mm  Coronary Artery Height above Annulus:  Left Main: 14mm above annulus  Right Coronary: 19 mm above annulus  Virtual Basal Annulus Measurements:  Maximum/Minimum Diameter: 31.68mm and 23.5 mm  Perimeter: 87 mm  Area: 590 mm 40% phase  Coronary Arteries: Heavily calcified: LM non obstructive. LAD  possibly occluded in mid vessel fills from LIMA. Circumflex  occluded. RCA occluded distally. SVG to RCA occluded SVG to Diagonal  patent, SVG to OM patent LIMA patent to LAD.  IMPRESSION:  1) Virtual basal annulus suitable for a fully inflated 29mm Sapien  XT valve  2) Somewhat bulky mural plaque in the aortic arch near the left  subclavian take off that may pose an embolic risk for transfemoral  approach  3) Occluded RCA graft with patent LIMA, and SVG;s to OM and Diagonal  Charlton Haws  Electronically Signed:  By: Charlton Haws  On: 12/26/2012 18:17     Study Result        CLINICAL DATA: 79 year old male with history of severe aortic  stenosis. Preprocedural study for potential transcatheter thoracic  aortic valve replacement (TAVR) procedure.  EXAM:  CT ANGIOGRAPHY ABDOMEN AND PELVIS WITH CONTRAST AND WITHOUT CONTRAST  TECHNIQUE:  Multidetector CT imaging of the abdomen and pelvis was performed  using the standard protocol during bolus administration of  intravenous contrast. Multiplanar reconstructed images including  MIPs were obtained and reviewed to evaluate the vascular anatomy.  CONTRAST: OMNIPAQUE IOHEXOL 350 MG/ML SOLN  COMPARISON: No priors.  FINDINGS:  CT ABDOMEN AND PELVIS FINDINGS  Abdomen/Pelvis: The appearance of the liver, gallbladder,  pancreas,  spleen and bilateral adrenal glands is unremarkable. There is  multifocal cortical thinning in the kidneys bilaterally, favored to  represent mild post infectious or inflammatory scarring. Mild  bilateral renal atrophy (left greater than right). Multiple  subcentimeter low attenuation lesions are seen in the kidneys  bilaterally, too small to definitively characterize.  Extensive atherosclerosis of the abdominal and pelvic vasculature,  with vascular measurements pertinent to potential TAVR procedure, as  below. There is fusiform aneurysmal dilatation of the distal  infrarenal abdominal aorta which measures up to 4.4 x 4.7 cm at the  level of the aortic bifurcation. This aneurysmal dilatation extends  into both of the common iliac arteries, with the right common iliac  artery measuring up to 4.6 cm in diameter and the left common iliac  artery measuring up to 4.0 cm in diameter respectively. This  aneurysmal dilatation further extends into the right internal iliac  artery distribution, with the right internal iliac artery measuring  up to 4.5 cm in diameter. There is also mild aneurysmal dilatation  of the proximal left external iliac artery which measures up to 2.3  cm in diameter. All of these vessels are markedly tortuous, and  there is extensive  eccentric nonenhancing material associated with  the areas of aneurysmal dilatation, compatible with extensive mural  thrombus. Aneurysmal dilatation of the proximal right common femoral  artery is also noted measuring up to 2.2 cm in diameter. Left  superficial femoral artery appears completely occluded shortly after  its origin.  Evaluation of the anatomic pelvis is limited by beam hardening  artifact from patient's left-sided total hip arthroplasty. With  these limitations in mind, there is no significant volume of  ascites. No pneumoperitoneum. No pathologic distention of small  bowel. Numerous colonic diverticula are noted,  most pronounced in  the distal descending colon and proximal sigmoid colon, without  surrounding inflammatory changes to suggest an acute diverticulitis  at this time. Small ventral hernia in the epigastric region  containing only a small amount of omental fat. No evidence of  associated bowel incarceration or obstruction. No definite  pathologic lymphadenopathy identified within the abdomen or pelvis.  Musculoskeletal: There are no aggressive appearing lytic or blastic  lesions noted in the visualized portions of the skeleton.  VASCULAR MEASUREMENTS PERTINENT TO TAVR:  AORTA:  Minimal Aortic Diameter - 2.0 x 1.6 cm  Severity of Aortic Calcification - moderate  RIGHT PELVIS:  Right Common Iliac Artery -  Minimal Diameter - 18.3 x 16.9 mm  Tortuosity - mild  Calcification - moderate  Right External Iliac Artery -  Minimal Diameter - 12.3 x 7.2 mm  Tortuosity - severe  Calcification - moderate  Right Common Femoral Artery -  Minimal Diameter - 11.8 x 11.7 mm  Tortuosity - mild  Calcification - moderate  LEFT PELVIS:  Left Common Iliac Artery -  Minimal Diameter - 17.7 x 18.9 mm  Tortuosity - mild  Calcification - moderate  Left External Iliac Artery -  Minimal Diameter - 11.2 x 6.3 mm  Tortuosity - severe  Calcification - moderate  Left Common Femoral Artery -  Minimal Diameter - 8.5 x 7.5 mm  Tortuosity - mild  Calcification - mild  Review of the MIP images confirms the above findings.  IMPRESSION:  1. Findings and measurements pertinent to potential TAVR procedure,  as discussed above. Although the calibers of the pelvic arterial  system is large enough to accommodate the devices, the extreme  tortuosity of the external iliac arteries bilaterally, the  multifocal aneurysmal dilatation, and the extensive atheromatous  plaque and mural thrombus throughout the systems makes pelvic  arterial access challenging.  2. In addition to the vascular findings pertinent to  potential TAVR,  there is extensive aneurysm formation throughout the distal  infrarenal abdominal aorta as well as the pelvic vasculature  bilaterally, as detailed above. As well, there is complete occlusion  of the proximal left superficial femoral artery. Throughout these  areas of aneurysmal dilatation there is extensive mural thrombus and  atheromatous plaque.  3. Colonic diverticulosis without findings to suggest acute  diverticulitis at this time.  4. Additional incidental findings, as above.  Electronically Signed  By: Trudie Reed M.D.  On: 12/31/2012 11:47    Diagnostic Left hand, Right Heart Catheterization with Coronary and Bypass Graft Angiography Report  [place] Choi  79 y.o.  male  [birth year]  Procedure Date: 12/20/2012  Referring Physician: Casimiro Needle Cooper/ [facility]  Primary Cardiologist:: Pre TAVR evaluation  PROCEDURE: Left and right heart catheterization with selective coronary angiography, bypass graft angiography, left ventriculogram.  INDICATIONS: The patient is being considered for TAVR.  The risks, benefits, and details of the procedure were explained to the patient.  The patient verbalized understanding and wanted to proceed. Informed written consent was obtained.  PROCEDURE TECHNIQUE: After Xylocaine anesthesia a 6 French sheath was placed in the right femoral artery and a 7 French sheath in the right femoral vein with a single anterior needle wall stick. There is heavy calcification and tortuosity within the distal aortoiliac and femoral vascular territory. Right heart catheterization was performed with a Swan-Ganz . Both thermodilution and Fick cardiac output determinations were made. Left heart catheterization was performed using an A2 MP 6 French catheter. The saphenous vein grafts were in much with this catheter. A 6 [place] JR 4 was used for right coronary angiography, and also used to attempt crossing the aortic valve. We were successful in crossing the  valve with a straight 0.036 wire high out of the 6 [place] Judkins right catheter would not advance through the valve. Since we knew that the valve is extremely tight, further attempts to cross the valve were aborted. Prior crossing the valve. We did give 2000 units of IV heparin. Left coronary angiography was done using a 6 [place] JL4 diagnostic catheter. Left ventriculography was not performed as we were unable to advance the Judkins right catheter across the aortic valve after we entered the ventricle with the guidewire. No complications occurred.  CONTRAST: Total of 170 cc.  COMPLICATIONS: None.  HEMODYNAMICS: Aortic pressure was 144/69 mmHg; LV pressure was not recorded; LVEDP not recorded; right atrial mean 6 mm mercury; right ventricle 28/6 mmHg; PA pressure 23/8 mmHg; pulmonary capillary wedge mean 8 mm mercury; cardiac output 4.11 L per minute (Thermal), and 5.46 L per minute (Fick); Aortic Valve gradient was not recorded.  ANGIOGRAPHIC DATA: The left main coronary artery is severe distal disease.  The left anterior descending artery is competitive flow with the patent LIMA.  The left circumflex artery is totally occluded.  The right coronary artery is totally occluded distally with severe proximal and mid disease.  BYPASS GRAFT ANGIOGRAPHY:  SVG to diagonal: Ectatic, but widely patent.  SVG to obtuse marginal: Ectatic, but widely patent.  Saphenous vein graft to the distal right coronary: Totally occluded at the aorto ostial junction.  LIMA to LAD: Widely patent  LEFT VENTRICULOGRAM: Not performed. A Judkins, right diagnostic catheter would not advance across the aortic valve. After crossing the valve with a straight tipped 0.036 mm wire .  IMPRESSIONS: 1. Total occlusion of the saphenous vein graft to the right coronary. This is been previously documented.  2. Widely patent saphenous vein graft to the diagonal, obtuse marginal, and the LIMA to the LAD.  3. Normal pulmonary artery pressures.   4. Ectatic and calcified right aortoiliac anatomy.  5. Previously documented severe/critical aortic stenosis, , documented by echo and suggested by this cath as a diagnostic catheter would not cross the valve.  RECOMMENDATION: He will be referred back to the valve clinic for further workup and hopefully progression to TAVR for critical AS.      Impression:   The patient's annular area was measured at 590 mm2, suitable for a 29 mm Sapien XT valve. He has severe aortoiliac disease with bilateral iliac artery aneurysms measuring almost 5 cm on the right with marked calcification and tortuosity. He is not a candidate for transfemoral approach and since he has had prior coronary bypass surgery a transapical approach would be best. Dr. Cornelius Moras and I have reviewed the scans and feel that we should expose the right axillary artery and sew a graft onto it before deploying the  transcatheter valve in case we need to put the patient on cardiopulmonary bypass emergently. His iliac artery disease would preclude using  femoral arterial cannulation to put him on bypass. His cardiac catheterization shows patent saphenous vein grafts to the optional diagonal and obtuse marginal branches as well as a patent LIMA to the LAD. The saphenous vein graft to the right coronary artery is chronically occluded. The native right coronary artery is open but diffusely diseased. I don't think this will be significant since he has had no anginal symptoms.   During the course of the patient's preoperative work up he and his wife have been evaluated comprehensively by a multidisciplinary team of specialists coordinated through the Multidisciplinary Heart Valve Clinic in the [facility]. They have been demonstrated to suffer from symptomatic severe aortic stenosis as noted above. The patient has been counseled extensively as to the relative risks and benefits of all options for the treatment of severe aortic stenosis  including long term medical therapy, conventional surgery for aortic valve replacement, and transcatheter aortic valve replacement. The patient has been independently evaluated by two cardiac surgeons including myself and Dr. Cornelius Moras, and Dr. Verdis Prime and Dr. Tonny Bollman and they all felt he would be at high risk for conventional surgical aortic valve replacement based upon a predicted risk of mortality using the Society of Thoracic Surgeons risk calculator of 9.3 %. All of [place] feel that the patient would be a poor candidate for conventional surgery (predicted risk of mortality >15% and/or predicted risk of permanent morbidity >50%) because of comorbidities including advanced age, severe steroid and methotrexate dependent rheumatoid arthritis, and prior coronary bypass surgery.   Based upon review of all of the patient's preoperative diagnostic tests they are felt to be candidate for transcatheter aortic valve replacement using the transapical approach as an alternative to high risk conventional surgery.  Following the decision to proceed with transcatheter aortic valve replacement, a discussion has been held regarding what types of management strategies would be attempted intraoperatively in the event of life-threatening complications, including whether or not the patient would be considered a candidate for the use of cardiopulmonary bypass and/or conversion to open sternotomy for attempted surgical intervention. The patient has been advised of a variety of complications that might develop including but not limited to risks of death, stroke, paravalvular leak, aortic dissection or other major vascular complications, aortic annulus rupture, device embolization, cardiac rupture or perforation, mitral regurgitation, acute myocardial infarction, arrhythmia, heart block or bradycardia requiring permanent pacemaker placement, congestive heart failure, respiratory failure, renal failure, pneumonia, infection, other  late complications related to structural valve deterioration or migration, or other complications that might ultimately cause a temporary or permanent loss of functional independence or other long term morbidity. The patient provides full informed consent for the procedure as described and all questions were answered.   Plan:   Transapical TAVR on 01/15/2013

## 2013-01-15 ENCOUNTER — Inpatient Hospital Stay (HOSPITAL_COMMUNITY): Payer: Medicare Other

## 2013-01-15 ENCOUNTER — Inpatient Hospital Stay (HOSPITAL_COMMUNITY)
Admission: RE | Admit: 2013-01-15 | Discharge: 2013-01-22 | DRG: 266 | Disposition: A | Discharge status: Home / Self Care | Payer: Medicare Other | Source: Ambulatory Visit | Attending: Surgery | Admitting: Surgery

## 2013-01-15 ENCOUNTER — Encounter (HOSPITAL_COMMUNITY): Payer: Self-pay | Admitting: *Deleted

## 2013-01-15 ENCOUNTER — Encounter (HOSPITAL_COMMUNITY): Payer: Medicare Other | Admitting: Vascular Surgery

## 2013-01-15 ENCOUNTER — Inpatient Hospital Stay (HOSPITAL_COMMUNITY): Payer: Medicare Other | Admitting: Anesthesiology

## 2013-01-15 ENCOUNTER — Encounter (HOSPITAL_COMMUNITY)
Admission: RE | Disposition: A | Discharge status: Home / Self Care | Payer: Medicare Other | Source: Ambulatory Visit | Attending: Surgery

## 2013-01-15 DIAGNOSIS — I2510 Atherosclerotic heart disease of native coronary artery without angina pectoris: Secondary | ICD-10-CM | POA: Diagnosis present

## 2013-01-15 DIAGNOSIS — Z96649 Presence of unspecified artificial hip joint: Secondary | ICD-10-CM

## 2013-01-15 DIAGNOSIS — I25810 Atherosclerosis of coronary artery bypass graft(s) without angina pectoris: Secondary | ICD-10-CM | POA: Diagnosis present

## 2013-01-15 DIAGNOSIS — I714 Abdominal aortic aneurysm, without rupture, unspecified: Secondary | ICD-10-CM | POA: Diagnosis present

## 2013-01-15 DIAGNOSIS — N400 Enlarged prostate without lower urinary tract symptoms: Secondary | ICD-10-CM | POA: Diagnosis present

## 2013-01-15 DIAGNOSIS — J9819 Other pulmonary collapse: Secondary | ICD-10-CM | POA: Diagnosis not present

## 2013-01-15 DIAGNOSIS — E785 Hyperlipidemia, unspecified: Secondary | ICD-10-CM | POA: Diagnosis present

## 2013-01-15 DIAGNOSIS — Z7982 Long term (current) use of aspirin: Secondary | ICD-10-CM

## 2013-01-15 DIAGNOSIS — Z23 Encounter for immunization: Secondary | ICD-10-CM

## 2013-01-15 DIAGNOSIS — Z006 Encounter for examination for normal comparison and control in clinical research program: Secondary | ICD-10-CM

## 2013-01-15 DIAGNOSIS — I10 Essential (primary) hypertension: Secondary | ICD-10-CM | POA: Diagnosis present

## 2013-01-15 DIAGNOSIS — D62 Acute posthemorrhagic anemia: Secondary | ICD-10-CM | POA: Diagnosis not present

## 2013-01-15 DIAGNOSIS — Z96659 Presence of unspecified artificial knee joint: Secondary | ICD-10-CM

## 2013-01-15 DIAGNOSIS — E039 Hypothyroidism, unspecified: Secondary | ICD-10-CM | POA: Diagnosis present

## 2013-01-15 DIAGNOSIS — M069 Rheumatoid arthritis, unspecified: Secondary | ICD-10-CM | POA: Diagnosis present

## 2013-01-15 DIAGNOSIS — E876 Hypokalemia: Secondary | ICD-10-CM | POA: Diagnosis not present

## 2013-01-15 DIAGNOSIS — R5381 Other malaise: Secondary | ICD-10-CM | POA: Diagnosis present

## 2013-01-15 DIAGNOSIS — I359 Nonrheumatic aortic valve disorder, unspecified: Secondary | ICD-10-CM

## 2013-01-15 DIAGNOSIS — I723 Aneurysm of iliac artery: Secondary | ICD-10-CM | POA: Diagnosis present

## 2013-01-15 DIAGNOSIS — IMO0002 Reserved for concepts with insufficient information to code with codable children: Secondary | ICD-10-CM

## 2013-01-15 DIAGNOSIS — Z79899 Other long term (current) drug therapy: Secondary | ICD-10-CM

## 2013-01-15 DIAGNOSIS — Z952 Presence of prosthetic heart valve: Secondary | ICD-10-CM

## 2013-01-15 DIAGNOSIS — K219 Gastro-esophageal reflux disease without esophagitis: Secondary | ICD-10-CM | POA: Diagnosis present

## 2013-01-15 DIAGNOSIS — J189 Pneumonia, unspecified organism: Secondary | ICD-10-CM | POA: Diagnosis not present

## 2013-01-15 DIAGNOSIS — I4949 Other premature depolarization: Secondary | ICD-10-CM | POA: Diagnosis not present

## 2013-01-15 DIAGNOSIS — I350 Nonrheumatic aortic (valve) stenosis: Secondary | ICD-10-CM

## 2013-01-15 DIAGNOSIS — Z9861 Coronary angioplasty status: Secondary | ICD-10-CM

## 2013-01-15 DIAGNOSIS — E8779 Other fluid overload: Secondary | ICD-10-CM | POA: Diagnosis not present

## 2013-01-15 DIAGNOSIS — Z87891 Personal history of nicotine dependence: Secondary | ICD-10-CM

## 2013-01-15 HISTORY — DX: Presence of prosthetic heart valve: Z95.2

## 2013-01-15 HISTORY — PX: TRANSCATHETER AORTIC VALVE REPLACEMENT, TRANSAPICAL: SHX6401

## 2013-01-15 HISTORY — PX: INTRAOPERATIVE TRANSESOPHAGEAL ECHOCARDIOGRAM: SHX5062

## 2013-01-15 LAB — CBC
HCT: 33.3 % — ABNORMAL LOW (ref 39.0–52.0)
Hemoglobin: 11.1 g/dL — ABNORMAL LOW (ref 13.0–17.0)
Hemoglobin: 11.8 g/dL — ABNORMAL LOW (ref 13.0–17.0)
MCH: 30.9 pg (ref 26.0–34.0)
MCH: 31.2 pg (ref 26.0–34.0)
MCHC: 33.3 g/dL (ref 30.0–36.0)
MCHC: 33.8 g/dL (ref 30.0–36.0)
MCV: 92.8 fL (ref 78.0–100.0)
Platelets: 150 10*3/uL (ref 150–400)
Platelets: 155 10*3/uL (ref 150–400)
RBC: 3.59 MIL/uL — ABNORMAL LOW (ref 4.22–5.81)
RBC: 3.78 MIL/uL — ABNORMAL LOW (ref 4.22–5.81)
RDW: 16.6 % — ABNORMAL HIGH (ref 11.5–15.5)
RDW: 16.6 % — ABNORMAL HIGH (ref 11.5–15.5)
WBC: 9.9 10*3/uL (ref 4.0–10.5)

## 2013-01-15 LAB — POCT I-STAT 4, (NA,K, GLUC, HGB,HCT)
Glucose, Bld: 92 mg/dL (ref 70–99)
Glucose, Bld: 102 mg/dL — ABNORMAL HIGH (ref 70–99)
Glucose, Bld: 111 mg/dL — ABNORMAL HIGH (ref 70–99)
Glucose, Bld: 100 mg/dL — ABNORMAL HIGH (ref 70–99)
HCT: 39 % (ref 39.0–52.0)
HCT: 33 % — ABNORMAL LOW (ref 39.0–52.0)
HCT: 34 % — ABNORMAL LOW (ref 39.0–52.0)
HCT: 36 % — ABNORMAL LOW (ref 39.0–52.0)
Hemoglobin: 13.3 g/dL (ref 13.0–17.0)
Hemoglobin: 13.3 g/dL (ref 13.0–17.0)
Hemoglobin: 11.2 g/dL — ABNORMAL LOW (ref 13.0–17.0)
Potassium: 3.5 mEq/L (ref 3.5–5.1)
Potassium: 3.7 mEq/L (ref 3.5–5.1)
Sodium: 131 mEq/L — ABNORMAL LOW (ref 135–145)
Sodium: 131 mEq/L — ABNORMAL LOW (ref 135–145)

## 2013-01-15 LAB — POCT I-STAT 3, ART BLOOD GAS (G3+)
Acid-Base Excess: 2 mmol/L (ref 0.0–2.0)
Acid-Base Excess: 4 mmol/L — ABNORMAL HIGH (ref 0.0–2.0)
Acid-base deficit: 5 mmol/L — ABNORMAL HIGH (ref 0.0–2.0)
Bicarbonate: 24.1 mEq/L — ABNORMAL HIGH (ref 20.0–24.0)
Bicarbonate: 27.6 mEq/L — ABNORMAL HIGH (ref 20.0–24.0)
Bicarbonate: 27.4 mEq/L — ABNORMAL HIGH (ref 20.0–24.0)
Bicarbonate: 17.9 mEq/L — ABNORMAL LOW (ref 20.0–24.0)
O2 Saturation: 100 %
O2 Saturation: 98 %
O2 Saturation: 96 %
Patient temperature: 35.5
Patient temperature: 36.4
TCO2: 25 mmol/L (ref 0–100)
TCO2: 29 mmol/L (ref 0–100)
TCO2: 19 mmol/L (ref 0–100)
pCO2 arterial: 24.8 mmHg — ABNORMAL LOW (ref 35.0–45.0)
pH, Arterial: 7.353 (ref 7.350–7.450)
pH, Arterial: 7.423 (ref 7.350–7.450)
pH, Arterial: 7.483 — ABNORMAL HIGH (ref 7.350–7.450)
pO2, Arterial: 90 mmHg (ref 80.0–100.0)
pO2, Arterial: 72 mmHg — ABNORMAL LOW (ref 80.0–100.0)

## 2013-01-15 LAB — BASIC METABOLIC PANEL
BUN: 9 mg/dL (ref 6–23)
Calcium: 8 mg/dL — ABNORMAL LOW (ref 8.4–10.5)
Chloride: 98 mEq/L (ref 96–112)
Creatinine, Ser: 0.71 mg/dL (ref 0.50–1.35)
GFR calc Af Amer: >90 mL/min (ref >90)

## 2013-01-15 LAB — POCT I-STAT 7, (LYTES, BLD GAS, ICA,H+H)
Bicarbonate: 24.3 mEq/L — ABNORMAL HIGH (ref 20.0–24.0)
HCT: 31 % — ABNORMAL LOW (ref 39.0–52.0)
Hemoglobin: 10.5 g/dL — ABNORMAL LOW (ref 13.0–17.0)
Patient temperature: 35.7
Potassium: 3.2 mEq/L — ABNORMAL LOW (ref 3.5–5.1)
Sodium: 132 mEq/L — ABNORMAL LOW (ref 135–145)
TCO2: 25 mmol/L (ref 0–100)
pH, Arterial: 7.434 (ref 7.350–7.450)
pO2, Arterial: 93 mmHg (ref 80.0–100.0)

## 2013-01-15 LAB — MAGNESIUM: Magnesium: 2.9 mg/dL — ABNORMAL HIGH (ref 1.5–2.5)

## 2013-01-15 LAB — PROTIME-INR: INR: 1.19 (ref 0.00–1.49)

## 2013-01-15 LAB — CREATININE, SERUM
Creatinine, Ser: 0.63 mg/dL (ref 0.50–1.35)
GFR calc Af Amer: >90 mL/min (ref >90)

## 2013-01-15 LAB — PREPARE RBC (CROSSMATCH)

## 2013-01-15 SURGERY — REPLACEMENT, AORTIC VALVE, TRANSAPICAL APPROACH
Anesthesia: General

## 2013-01-15 MED ORDER — ACETAMINOPHEN 650 MG RE SUPP
650.0000 mg | Freq: Once | RECTAL | Status: AC
  Administered 2013-01-15: 650 mg via RECTAL

## 2013-01-15 MED ORDER — VECURONIUM BROMIDE 10 MG IV SOLR
INTRAVENOUS | Status: DC | PRN
  Administered 2013-01-15: 3 mg via INTRAVENOUS
  Administered 2013-01-15: 2 mg via INTRAVENOUS
  Administered 2013-01-15: 5 mg via INTRAVENOUS

## 2013-01-15 MED ORDER — OXYCODONE HCL 5 MG PO TABS: 5.0000 mg | ORAL_TABLET | ORAL | Status: DC | PRN

## 2013-01-15 MED ORDER — METHOTREXATE 2.5 MG PO TABS
25.0000 mg | ORAL_TABLET | ORAL | Status: DC
  Administered 2013-01-19: 25 mg via ORAL
  Filled 2013-01-15: qty 10

## 2013-01-15 MED ORDER — DEXMEDETOMIDINE HCL IN NACL 200 MCG/50ML IV SOLN
0.1000 ug/kg/h | INTRAVENOUS | Status: DC
  Administered 2013-01-15: 0.7 ug/kg/h via INTRAVENOUS
  Filled 2013-01-15: qty 50

## 2013-01-15 MED ORDER — NITROGLYCERIN IN D5W 200-5 MCG/ML-% IV SOLN: 0.0000 ug/min | INTRAVENOUS | Status: DC

## 2013-01-15 MED ORDER — ARTIFICIAL TEARS OP OINT
TOPICAL_OINTMENT | OPHTHALMIC | Status: DC | PRN
  Administered 2013-01-15: 1 via OPHTHALMIC

## 2013-01-15 MED ORDER — ALBUMIN HUMAN 5 % IV SOLN
250.0000 mL | INTRAVENOUS | Status: DC | PRN
  Administered 2013-01-15: 250 mL via INTRAVENOUS

## 2013-01-15 MED ORDER — LEVOTHYROXINE SODIUM 25 MCG PO TABS
25.0000 ug | ORAL_TABLET | ORAL | Status: DC
  Administered 2013-01-17 – 2013-01-22 (×4): 25 ug via ORAL
  Filled 2013-01-15 (×5): qty 1

## 2013-01-15 MED ORDER — IODIXANOL 320 MG/ML IV SOLN
INTRAVENOUS | Status: DC | PRN
  Administered 2013-01-15: 150 mL via INTRAVENOUS

## 2013-01-15 MED ORDER — HEMOSTATIC AGENTS (NO CHARGE) OPTIME
TOPICAL | Status: DC | PRN
  Administered 2013-01-15: 1 via TOPICAL

## 2013-01-15 MED ORDER — LACTATED RINGERS IV SOLN
INTRAVENOUS | Status: DC | PRN
  Administered 2013-01-15 (×3): via INTRAVENOUS

## 2013-01-15 MED ORDER — CLOPIDOGREL BISULFATE 75 MG PO TABS
75.0000 mg | ORAL_TABLET | Freq: Every day | ORAL | Status: DC
  Administered 2013-01-16 – 2013-01-22 (×7): 75 mg via ORAL
  Filled 2013-01-15 (×8): qty 1

## 2013-01-15 MED ORDER — ROCURONIUM BROMIDE 100 MG/10ML IV SOLN
INTRAVENOUS | Status: DC | PRN
  Administered 2013-01-15: 50 mg via INTRAVENOUS

## 2013-01-15 MED ORDER — HEPARIN SODIUM (PORCINE) 1000 UNIT/ML IJ SOLN
INTRAMUSCULAR | Status: DC | PRN
  Administered 2013-01-15: 10000 [IU] via INTRAVENOUS

## 2013-01-15 MED ORDER — MORPHINE SULFATE 2 MG/ML IJ SOLN: 1.0000 mg | INTRAMUSCULAR | Status: AC | PRN

## 2013-01-15 MED ORDER — HYDROCORTISONE SOD SUCCINATE 100 MG IJ SOLR
100.0000 mg | Freq: Three times a day (TID) | INTRAMUSCULAR | Status: AC
  Administered 2013-01-15 – 2013-01-16 (×3): 100 mg via INTRAVENOUS
  Filled 2013-01-15 (×3): qty 2

## 2013-01-15 MED ORDER — POTASSIUM CHLORIDE 10 MEQ/50ML IV SOLN
10.0000 meq | INTRAVENOUS | Status: AC
  Administered 2013-01-15 (×3): 10 meq via INTRAVENOUS

## 2013-01-15 MED ORDER — SODIUM CHLORIDE 0.9 % IJ SOLN
INTRAMUSCULAR | Status: AC
  Filled 2013-01-15: qty 20

## 2013-01-15 MED ORDER — ALBUMIN HUMAN 5 % IV SOLN
INTRAVENOUS | Status: DC | PRN
  Administered 2013-01-15: 08:00:00 via INTRAVENOUS

## 2013-01-15 MED ORDER — INSULIN REGULAR BOLUS VIA INFUSION
0.0000 [IU] | Freq: Three times a day (TID) | INTRAVENOUS | Status: DC
Start: 2013-01-15 — End: 2013-01-16
  Filled 2013-01-15: qty 10

## 2013-01-15 MED ORDER — SODIUM CHLORIDE 0.9 % IV SOLN
INTRAVENOUS | Status: DC | PRN
  Administered 2013-01-15: 10:00:00 via INTRAVENOUS

## 2013-01-15 MED ORDER — ACETAMINOPHEN 160 MG/5ML PO SOLN: 650.0000 mg | Freq: Once | ORAL | Status: AC

## 2013-01-15 MED ORDER — DEXTROSE 5 % IV SOLN
1.5000 g | Freq: Two times a day (BID) | INTRAVENOUS | Status: DC
  Administered 2013-01-15 – 2013-01-16 (×3): 1.5 g via INTRAVENOUS
  Filled 2013-01-15 (×4): qty 1.5

## 2013-01-15 MED ORDER — MORPHINE SULFATE 2 MG/ML IJ SOLN
2.0000 mg | INTRAMUSCULAR | Status: DC | PRN
  Administered 2013-01-15 – 2013-01-16 (×2): 2 mg via INTRAVENOUS
  Filled 2013-01-15 (×2): qty 1

## 2013-01-15 MED ORDER — ACETAMINOPHEN 500 MG PO TABS
1000.0000 mg | ORAL_TABLET | Freq: Four times a day (QID) | ORAL | Status: DC
  Administered 2013-01-15 – 2013-01-16 (×3): 1000 mg via ORAL
  Filled 2013-01-15 (×5): qty 2

## 2013-01-15 MED ORDER — CHLORHEXIDINE GLUCONATE 4 % EX LIQD: 30.0000 mL | CUTANEOUS | Status: DC

## 2013-01-15 MED ORDER — SODIUM CHLORIDE 0.9 % IR SOLN
Status: DC | PRN
  Administered 2013-01-15 (×2)

## 2013-01-15 MED ORDER — PANTOPRAZOLE SODIUM 40 MG PO TBEC: 40.0000 mg | DELAYED_RELEASE_TABLET | Freq: Every day | ORAL | Status: DC

## 2013-01-15 MED ORDER — SODIUM CHLORIDE 0.9 % IJ SOLN
INTRAMUSCULAR | Status: DC | PRN
  Administered 2013-01-15: 20 mL via INTRAVENOUS

## 2013-01-15 MED ORDER — SODIUM CHLORIDE 0.9 % IV SOLN
INTRAVENOUS | Status: DC
  Administered 2013-01-15: 1.2 [IU]/h via INTRAVENOUS
  Filled 2013-01-15 (×2): qty 1

## 2013-01-15 MED ORDER — PROTAMINE SULFATE 10 MG/ML IV SOLN
INTRAVENOUS | Status: DC | PRN
  Administered 2013-01-15 (×2): 25 mg via INTRAVENOUS
  Administered 2013-01-15: 50 mg via INTRAVENOUS

## 2013-01-15 MED ORDER — PROPOFOL 10 MG/ML IV BOLUS
INTRAVENOUS | Status: DC | PRN
  Administered 2013-01-15: 40 mg via INTRAVENOUS

## 2013-01-15 MED ORDER — SODIUM CHLORIDE 0.9 % IV SOLN
1.0000 mL/kg/h | INTRAVENOUS | Status: AC
  Administered 2013-01-15: 1 mL/kg/h via INTRAVENOUS

## 2013-01-15 MED ORDER — MIDAZOLAM HCL 5 MG/5ML IJ SOLN
INTRAMUSCULAR | Status: DC | PRN
  Administered 2013-01-15: 4 mg via INTRAVENOUS

## 2013-01-15 MED ORDER — LACTATED RINGERS IV SOLN: 500.0000 mL | Freq: Once | INTRAVENOUS | Status: AC | PRN

## 2013-01-15 MED ORDER — SODIUM CHLORIDE 0.9 % IV SOLN: INTRAVENOUS | Status: DC

## 2013-01-15 MED ORDER — EPINEPHRINE HCL 1 MG/ML IJ SOLN
INTRAMUSCULAR | Status: DC | PRN
  Administered 2013-01-15: 1 mg via INTRAVENOUS

## 2013-01-15 MED ORDER — CHLORHEXIDINE GLUCONATE CLOTH 2 % EX PADS
6.0000 | MEDICATED_PAD | Freq: Every day | CUTANEOUS | Status: DC
  Administered 2013-01-15 – 2013-01-16 (×2): 6 via TOPICAL

## 2013-01-15 MED ORDER — MUPIROCIN 2 % EX OINT
1.0000 "application " | TOPICAL_OINTMENT | Freq: Two times a day (BID) | CUTANEOUS | Status: DC
  Administered 2013-01-15 – 2013-01-16 (×3): 1 via NASAL
  Filled 2013-01-15: qty 22

## 2013-01-15 MED ORDER — 0.9 % SODIUM CHLORIDE (POUR BTL) OPTIME
TOPICAL | Status: DC | PRN
  Administered 2013-01-15: 7000 mL

## 2013-01-15 MED ORDER — HYDROCORTISONE SOD SUCCINATE 100 MG IJ SOLR
INTRAMUSCULAR | Status: DC | PRN
  Administered 2013-01-15: 100 mg via INTRAVENOUS

## 2013-01-15 MED ORDER — ACETAMINOPHEN 160 MG/5ML PO SOLN: 1000.0000 mg | Freq: Four times a day (QID) | ORAL | Status: DC

## 2013-01-15 MED ORDER — FENTANYL CITRATE 0.05 MG/ML IJ SOLN
INTRAMUSCULAR | Status: DC | PRN
  Administered 2013-01-15: 200 ug via INTRAVENOUS
  Administered 2013-01-15: 250 ug via INTRAVENOUS
  Administered 2013-01-15: 50 ug via INTRAVENOUS

## 2013-01-15 MED ORDER — MAGNESIUM SULFATE 40 MG/ML IJ SOLN
4.0000 g | Freq: Once | INTRAMUSCULAR | Status: AC
  Administered 2013-01-15: 4 g via INTRAVENOUS
  Filled 2013-01-15: qty 100

## 2013-01-15 MED ORDER — METOPROLOL TARTRATE 1 MG/ML IV SOLN: 2.5000 mg | INTRAVENOUS | Status: DC | PRN

## 2013-01-15 MED ORDER — ONDANSETRON HCL 4 MG/2ML IJ SOLN: 4.0000 mg | Freq: Four times a day (QID) | INTRAMUSCULAR | Status: DC | PRN

## 2013-01-15 MED ORDER — METOPROLOL TARTRATE 12.5 MG HALF TABLET
12.5000 mg | ORAL_TABLET | Freq: Two times a day (BID) | ORAL | Status: DC
  Filled 2013-01-15 (×3): qty 1

## 2013-01-15 MED ORDER — ASPIRIN 81 MG PO CHEW: 324.0000 mg | CHEWABLE_TABLET | Freq: Every day | ORAL | Status: DC

## 2013-01-15 MED ORDER — FAMOTIDINE IN NACL 20-0.9 MG/50ML-% IV SOLN
20.0000 mg | Freq: Two times a day (BID) | INTRAVENOUS | Status: DC
  Administered 2013-01-15: 20 mg via INTRAVENOUS

## 2013-01-15 MED ORDER — METOPROLOL TARTRATE 25 MG/10 ML ORAL SUSPENSION
12.5000 mg | Freq: Two times a day (BID) | ORAL | Status: DC
  Filled 2013-01-15 (×3): qty 5

## 2013-01-15 MED ORDER — LIDOCAINE HCL (CARDIAC) 20 MG/ML IV SOLN
INTRAVENOUS | Status: DC | PRN
  Administered 2013-01-15: 100 mg via INTRAVENOUS

## 2013-01-15 MED ORDER — MIDAZOLAM HCL 2 MG/2ML IJ SOLN: 2.0000 mg | INTRAMUSCULAR | Status: DC | PRN

## 2013-01-15 MED ORDER — PHENYLEPHRINE HCL 10 MG/ML IJ SOLN
0.0000 ug/min | INTRAVENOUS | Status: DC
  Filled 2013-01-15: qty 2

## 2013-01-15 MED ORDER — SIMVASTATIN 20 MG PO TABS
20.0000 mg | ORAL_TABLET | Freq: Every evening | ORAL | Status: DC
  Administered 2013-01-15: 20 mg via ORAL
  Filled 2013-01-15 (×2): qty 1

## 2013-01-15 MED ORDER — VANCOMYCIN HCL IN DEXTROSE 1-5 GM/200ML-% IV SOLN
1000.0000 mg | Freq: Once | INTRAVENOUS | Status: AC
  Administered 2013-01-15: 1000 mg via INTRAVENOUS
  Filled 2013-01-15: qty 200

## 2013-01-15 MED ORDER — ASPIRIN EC 325 MG PO TBEC
325.0000 mg | DELAYED_RELEASE_TABLET | Freq: Every day | ORAL | Status: DC
  Filled 2013-01-15: qty 1

## 2013-01-15 SURGICAL SUPPLY — 127 items
ADAPTER CARDIOPLEGIA (MISCELLANEOUS) IMPLANT
ANTEGRADE CPLG (MISCELLANEOUS) IMPLANT
BAG BANDED W/RUBBER/TAPE 36X54 (MISCELLANEOUS) ×2 IMPLANT
BAG DECANTER FOR FLEXI CONT (MISCELLANEOUS) IMPLANT
BAG SNAP BAND KOVER 36X36 (MISCELLANEOUS) ×8 IMPLANT
BENZOIN TINCTURE PRP APPL 2/3 (GAUZE/BANDAGES/DRESSINGS) ×4 IMPLANT
BLADE OSCILLATING /SAGITTAL (BLADE) ×2 IMPLANT
BLADE STERNUM SYSTEM 6 (BLADE) ×2 IMPLANT
BLADE SURG ROTATE 9660 (MISCELLANEOUS) ×2 IMPLANT
CABLE PACING FASLOC BIEGE (MISCELLANEOUS) ×4 IMPLANT
CABLE PACING FASLOC BLUE (MISCELLANEOUS) ×2 IMPLANT
CANISTER SUCTION 2500CC (MISCELLANEOUS) ×4 IMPLANT
CANNULA FEM VENOUS REMOTE 22FR (CANNULA) IMPLANT
CANNULA FEMORAL ART 14 SM (MISCELLANEOUS) IMPLANT
CANNULA GUNDRY RCSP 15FR (MISCELLANEOUS) IMPLANT
CANNULA OPTISITE PERFUSION 16F (CANNULA) IMPLANT
CANNULA OPTISITE PERFUSION 18F (CANNULA) IMPLANT
CANNULA SOFTFLOW AORTIC 7M21FR (CANNULA) IMPLANT
CANNULA VENOUS LOW PROF 34X46 (CANNULA) IMPLANT
CATH DIAG EXPO 6F VENT PIG 145 (CATHETERS) ×2 IMPLANT
CATH EXPO 5FR FR4 (CATHETERS) ×2 IMPLANT
CATH HEART VENT LEFT (CATHETERS) IMPLANT
CATH S G BIP PACING (SET/KITS/TRAYS/PACK) ×6 IMPLANT
CLEANER TIP ELECTROSURG 2X2 (MISCELLANEOUS) ×2 IMPLANT
CLIP TI MEDIUM 6 (CLIP) ×2 IMPLANT
CLIP TI WIDE RED SMALL 6 (CLIP) ×2 IMPLANT
CONN ST 1/4X3/8  BEN (MISCELLANEOUS) ×4
CONN ST 1/4X3/8 BEN (MISCELLANEOUS) ×4 IMPLANT
CONNECTOR 1/2X3/8X1/2 3 WAY (MISCELLANEOUS)
CONNECTOR 1/2X3/8X1/2 3WAY (MISCELLANEOUS) IMPLANT
CONT SPECI 4OZ STER CLIK (MISCELLANEOUS) ×2 IMPLANT
COVER BACK TABLE 24X17X13 BIG (DRAPES) ×2 IMPLANT
COVER DOME SNAP 22 D (MISCELLANEOUS) ×4 IMPLANT
COVER PROBE W GEL 5X96 (DRAPES) IMPLANT
COVER SURGICAL LIGHT HANDLE (MISCELLANEOUS) ×2 IMPLANT
COVER TABLE BACK 60X90 (DRAPES) ×6 IMPLANT
CRADLE DONUT ADULT HEAD (MISCELLANEOUS) ×2 IMPLANT
DERMABOND ADVANCED (GAUZE/BANDAGES/DRESSINGS) ×2
DERMABOND ADVANCED .7 DNX12 (GAUZE/BANDAGES/DRESSINGS) ×2 IMPLANT
DRAIN CHANNEL 28F RND 3/8 FF (WOUND CARE) ×2 IMPLANT
DRAIN CHANNEL 32F RND 10.7 FF (WOUND CARE) IMPLANT
DRAPE INCISE IOBAN 66X45 STRL (DRAPES) IMPLANT
DRAPE SLUSH/WARMER DISC (DRAPES) ×2 IMPLANT
DRAPE SURG IRRIG POUCH 19X23 (DRAPES) ×4 IMPLANT
DRSG TEGADERM 4X4.75 (GAUZE/BANDAGES/DRESSINGS) ×2 IMPLANT
ELECT BLADE 6.5 EXT (BLADE) IMPLANT
ELECT REM PT RETURN 9FT ADLT (ELECTROSURGICAL) ×4
ELECTRODE REM PT RTRN 9FT ADLT (ELECTROSURGICAL) ×2 IMPLANT
FELT TEFLON 6X6 (MISCELLANEOUS) ×2 IMPLANT
FEMORAL VENOUS CANN RAP (CANNULA) IMPLANT
FILTER STRAW FLUID ASPIR (MISCELLANEOUS) ×2 IMPLANT
GLOVE BIOGEL M 6.5 STRL (GLOVE) IMPLANT
GLOVE BIOGEL M STER SZ 6 (GLOVE) IMPLANT
GLOVE ECLIPSE 7.5 STRL STRAW (GLOVE) ×2 IMPLANT
GLOVE ECLIPSE 8.0 STRL XLNG CF (GLOVE) ×6 IMPLANT
GLOVE EUDERMIC 7 POWDERFREE (GLOVE) ×4 IMPLANT
GLOVE ORTHO TXT STRL SZ7.5 (GLOVE) ×6 IMPLANT
GOWN PREVENTION PLUS XLARGE (GOWN DISPOSABLE) ×14 IMPLANT
GOWN STRL NON-REIN LRG LVL3 (GOWN DISPOSABLE) ×6 IMPLANT
GRAFT HEMASHIELD 8MM (Vascular Products) ×1 IMPLANT
GRAFT VASC STRG 30X8KNIT (Vascular Products) ×1 IMPLANT
GUIDEWIRE SAF TJ AMPL .035X180 (WIRE) ×2 IMPLANT
HEMOSTAT POWDER SURGIFOAM 1G (HEMOSTASIS) ×4 IMPLANT
KIT DILATOR VASC 18G NDL (KITS) IMPLANT
KIT HEART LEFT (KITS) ×2 IMPLANT
KIT ROOM TURNOVER OR (KITS) ×2 IMPLANT
KIT SUCTION CATH 14FR (SUCTIONS) ×6 IMPLANT
LEAD PACING MYOCARDI (MISCELLANEOUS) IMPLANT
NEEDLE 18GX1X1/2 (RX/OR ONLY) (NEEDLE) ×2 IMPLANT
NEEDLE PERC 18GX7CM (NEEDLE) ×4 IMPLANT
NS IRRIG 1000ML POUR BTL (IV SOLUTION) ×14 IMPLANT
PACK AORTA (CUSTOM PROCEDURE TRAY) ×2 IMPLANT
PAD ARMBOARD 7.5X6 YLW CONV (MISCELLANEOUS) ×4 IMPLANT
PAD ELECT DEFIB RADIOL ZOLL (MISCELLANEOUS) ×2 IMPLANT
PATCH TACHOSII LRG 9.5X4.8 (VASCULAR PRODUCTS) IMPLANT
PENCIL BUTTON HOLSTER BLD 10FT (ELECTRODE) ×2 IMPLANT
RETRACTOR PVM SOFT TISSUE M (INSTRUMENTS) ×2 IMPLANT
RETRACTOR TRL SOFT TISSUE LG (INSTRUMENTS) IMPLANT
RETRACTOR TRM SOFT TISSUE 7.5 (INSTRUMENTS) ×2 IMPLANT
SEALANT SURG COSEAL 4ML (VASCULAR PRODUCTS) ×2 IMPLANT
SHEATH PINNACLE 6F 10CM (SHEATH) ×10 IMPLANT
SPONGE GAUZE 4X4 12PLY (GAUZE/BANDAGES/DRESSINGS) ×8 IMPLANT
SPONGE LAP 4X18 X RAY DECT (DISPOSABLE) ×2 IMPLANT
STOPCOCK MORSE 400PSI 3WAY (MISCELLANEOUS) ×4 IMPLANT
SUCKER INTRACARDIAC WEIGHTED (SUCKER) ×2 IMPLANT
SUT BONE WAX W31G (SUTURE) IMPLANT
SUT ETHIBOND 2 0 SH (SUTURE) ×1
SUT ETHIBOND 2 0 SH 36X2 (SUTURE) ×1 IMPLANT
SUT ETHIBOND X763 2 0 SH 1 (SUTURE) ×4 IMPLANT
SUT MNCRL AB 3-0 PS2 18 (SUTURE) ×4 IMPLANT
SUT PROLENE 2 0 MH 48 (SUTURE) ×10 IMPLANT
SUT PROLENE 4 0 RB 1 (SUTURE) ×1
SUT PROLENE 4-0 RB1 .5 CRCL 36 (SUTURE) ×1 IMPLANT
SUT PROLENE 5 0 C 1 36 (SUTURE) ×4 IMPLANT
SUT PROLENE 6 0 C 1 30 (SUTURE) ×4 IMPLANT
SUT SILK  1 MH (SUTURE) ×1
SUT SILK 1 MH (SUTURE) ×1 IMPLANT
SUT SILK 1 TIES 10X30 (SUTURE) ×2 IMPLANT
SUT SILK 2 0 SH CR/8 (SUTURE) ×2 IMPLANT
SUT SILK 2 0SH CR/8 30 (SUTURE) IMPLANT
SUT TEM PAC WIRE 2 0 SH (SUTURE) IMPLANT
SUT VIC AB 1 CTX 36 (SUTURE) ×1
SUT VIC AB 1 CTX36XBRD ANBCTR (SUTURE) ×1 IMPLANT
SUT VIC AB 2-0 CT1 27 (SUTURE) ×1
SUT VIC AB 2-0 CT1 TAPERPNT 27 (SUTURE) ×1 IMPLANT
SUT VIC AB 2-0 CTX 36 (SUTURE) ×4 IMPLANT
SUT VIC AB 3-0 SH 8-18 (SUTURE) ×4 IMPLANT
SUT VIC AB 3-0 X1 27 (SUTURE) ×2 IMPLANT
SUT VICRYL 2 TP 1 (SUTURE) IMPLANT
SYR 30ML LL (SYRINGE) ×4 IMPLANT
SYR 3ML LL SCALE MARK (SYRINGE) ×2 IMPLANT
SYR 50ML LL SCALE MARK (SYRINGE) ×2 IMPLANT
SYR MEDRAD MARK V 150ML (SYRINGE) ×2 IMPLANT
SYSTEM SAHARA CHEST DRAIN ATS (WOUND CARE) ×2 IMPLANT
TAPE CLOTH SURG 4X10 WHT LF (GAUZE/BANDAGES/DRESSINGS) ×8 IMPLANT
TOWEL OR 17X24 6PK STRL BLUE (TOWEL DISPOSABLE) ×6 IMPLANT
TOWEL OR 17X26 10 PK STRL BLUE (TOWEL DISPOSABLE) ×4 IMPLANT
TRANSDUCER W/STOPCOCK (MISCELLANEOUS) ×4 IMPLANT
TRAY FOLEY IC TEMP SENS 14FR (CATHETERS) ×2 IMPLANT
TUBING ART PRESS 72  MALE/FEM (TUBING) ×1
TUBING ART PRESS 72 MALE/FEM (TUBING) ×1 IMPLANT
TUBING HIGH PRESSURE 120CM (CONNECTOR) ×2 IMPLANT
UNDERPAD 30X30 INCONTINENT (UNDERPADS AND DIAPERS) ×2 IMPLANT
VALVE HRT TRANSCATH ASCEND 29M (Valve) ×2 IMPLANT
VENT LEFT HEART 12002 (CATHETERS)
WATER STERILE IRR 1000ML POUR (IV SOLUTION) ×4 IMPLANT
WIRE .035 3MM-J 145CM (WIRE) ×2 IMPLANT

## 2013-01-15 NOTE — Anesthesia Postprocedure Evaluation (Signed)
  Anesthesia Post-op Note  Patient: Hunter Choi  Procedure(s) Performed: Procedure(s): TRANSCATHETER AORTIC VALVE REPLACEMENT, TRANSAPICAL (N/A) INTRAOPERATIVE TRANSESOPHAGEAL ECHOCARDIOGRAM (N/A)  Patient Location: SICU  Anesthesia Type:General  Level of Consciousness: sedated, unresponsive and Patient remains intubated per anesthesia plan  Airway and Oxygen Therapy: Patient remains intubated per anesthesia plan and Patient placed on Ventilator (see vital sign flow sheet for setting)  Post-op Pain: none  Post-op Assessment: Post-op Vital signs reviewed, Patient's Cardiovascular Status Stable and Respiratory Function Stable  Post-op Vital Signs: stable  Complications: No apparent anesthesia complications

## 2013-01-15 NOTE — Op Note (Signed)
CARDIOTHORACIC SURGERY OPERATIVE NOTE  Date of Procedure:  01/15/2013  Preoperative Diagnosis: Severe Aortic Stenosis   Postoperative Diagnosis: Same   Procedure:    Transcatheter Aortic Valve Replacement - Transapical Approach   Edwards Sapien XT THV (size 29 mm, model # 9300TFX, serial # [id number])  Anastomosis of 8 mm Hemashield graft to the right axillary artery    Co-Surgeons:  [name], MD and Tonny Bollman, MD  Assistants:   Salvatore Decent. Cornelius Moras, MD and Verne Carrow, MD  Anesthesiologist:  Kipp Brood, MD  Echocardiographer:  Charlton Haws, MD  Pre-operative Echo Findings:   severe aortic stenosis  good left ventricular systolic function with EF 50-55%  Mild MR  Small PFO  Post-operative Echo Findings:  trivial paravalvular leak  normal left ventricular systolic function    *[facility]* *[facility]* [address] [place], [place] [address] [phone]  ------------------------------------------------------------ Intraoperative Transesophageal Echocardiography  Patient: Hunter Choi, Hunter Choi MR #: [id number] Study Date: 01/15/2013 Gender: M Age: 79 Height: Weight: BSA: Pt. Status: Room: 2S04C  ADMITTING Evelene Croon ATTENDING Porche Steinberger, Nicole Cella cc:  ------------------------------------------------------------ LV EF: 55% - 60%  ------------------------------------------------------------ Study Conclusions  - Left ventricle: The cavity size was normal. There was concentric hypertrophy. Systolic function was normal. The estimated ejection fraction was in the range of 55% to 60%. - Aortic valve: Valve area: 0.6cm^2(VTI). Valve area: 0.64cm^2 (Vmax). - Mitral valve: Mild regurgitation. - Left atrium: The atrium was dilated. - Right atrium: No evidence of thrombus in the atrial cavity or appendage. - Atrial septum: There was a patent foramen ovale. - Impressions: Post TAVR: Placement of 29mm  Sapien XT valve. Trivial peri valvular regurgitation On short axis at 10:00. Laminar flow through valve with less than 5 mmHg gradient. No aortic root trauma. EF remains normal with no pericardial effusion. Degree of MR still mild Good Left Main flow seen. Impressions:  - Post TAVR: Placement of 29mm Sapien XT valve. Trivial peri valvular regurgitation On short axis at 10:00. Laminar flow through valve with less than 5 mmHg gradient. No aortic root trauma. EF remains normal with no pericardial effusion. Degree of MR still mild Good Left Main flow seen. Intraoperative transesophageal echocardiography.  ------------------------------------------------------------  ------------------------------------------------------------ Left ventricle: The cavity size was normal. There was concentric hypertrophy. Systolic function was normal. The estimated ejection fraction was in the range of 55% to 60%.  ------------------------------------------------------------ Aortic valve: Pre TAVR the AV was severely calcified with severe AS and mild AR. Doppler: VTI ratio of LVOT to aortic valve: 0.16. Valve area: 0.6cm^2(VTI). Peak velocity ratio of LVOT to aortic valve: 0.17. Valve area: 0.64cm^2 (Vmax). Mean gradient: 20mm Hg (S). Peak gradient: 27mm Hg (S).  ------------------------------------------------------------ Mitral valve: Doppler: Mild regurgitation.  ------------------------------------------------------------ Left atrium: The atrium was dilated.  ------------------------------------------------------------ Atrial septum: There was a patent foramen ovale.  ------------------------------------------------------------ Right ventricle: The cavity size was normal. Wall thickness was normal. Systolic function was normal.  ------------------------------------------------------------ Pulmonic valve: Doppler: Trivial  regurgitation.  ------------------------------------------------------------ Tricuspid valve: Doppler: Mild regurgitation.  ------------------------------------------------------------ Right atrium: The atrium was normal in size. No evidence of thrombus in the atrial cavity or appendage.  ------------------------------------------------------------ Pericardium: The pericardium was normal in appearance. There was no pericardial effusion.  ------------------------------------------------------------ Pre bypass:  Post bypass:  ------------------------------------------------------------  2D measurements Normal Doppler measurements Normal LVOT LVOT Diam, S 22 mm ------ Peak vel, S 43.8 cm/s ------ Area 3.8 cm^2 ------ VTI, S 9.45 cm ------ Aortic valve Peak vel, S 260 cm/s ------  Mean vel, S 185 cm/s ------ VTI, S 60.3 cm ------ Mean 20 mm ------ gradient, S Hg Peak 27 mm ------ gradient, S Hg VTI ratio 0.16 ------ LVOT/AV Area, VTI 0.6 cm^2 ------ Peak vel 0.17 ------ ratio, LVOT/AV Area, Vmax 0.64 cm^2 ------  ------------------------------------------------------------ Prepared and Electronically Authenticated by  Charlton Haws 2014-10-21T13:04:05.540    BRIEF CLINICAL NOTE AND INDICATIONS FOR SURGERY  The patient is an 79 year old gentleman with a history of severe rheumatoid arthritis on prednisone and methotrexate, poor mobility due to this and history of bilateral TKR and left hip fracture repair who underwent CABG x 4 by Dr. Tyrone Sage on 01/26/1998. He had a LIMA to the LAD and SVG's to the OM, D1 and RCA. He developed recurrent angina in 2004 and underwent cardiac catheterization on 09/25/2002 showing a chronically occluded saphenous vein graft to the right coronary with all the other grafts patent.  There was a 99% stenosis in the distal LAD which was subsequently treated on 09/27/2002 with a drug-eluting stent placed through the left internal mammary artery  into the distal LAD. The patient has been followed for aortic stenosis and I evaluated the patient on 04/20/2010 for progressive shortness of breath. His echocardiogram at that time showed an aortic valve area of 1 cm and his catheterization showed a peak to peak gradient across the aortic valve of 44 mm mercury with a mean gradient of only 25 mm mercury. His ejection fraction was 55%. The left internal mammary graft and saphenous vein grafts were patent with the exception of the chronically occluded saphenous vein graft to the right coronary artery. The native right coronary had significant diffuse disease within it. I did not feel the patient was an operative candidate at that time due to his severe debilitation. I did not feel that his moderate aortic stenosis was bad enough to warrant consideration of transcatheter aortic valve replacement. The patient now reports that over the last 6-12 months he has had progressive exertional dyspnea and fatigue that occurs with walking short distances in his house. He has had occasional episodes of chest discomfort. He has had orthopnea and has to sleep with 3 pillows. He denies dizziness and syncope. A recent echocardiogram shows progression of his aortic stenosis into the severe range with an aortic valve area of 0.43 cm, a mean gradient of 40 mm mercury, and a peak gradient of 65 mm mercury. Left ventricular ejection fraction is 50-55%. He returns today to discuss the results of his cardiac CT, CT of the chest, abdomen and pelvis, and cardiac cath. His symptoms of exertional dyspnea and orthopnea are unchanged. His PFT's show minimal obstructive disease with a corrected diffusion capacity of 81%. The patient's annular area was measured at 590 mm2, suitable for a 29 mm Sapien XT valve. He has severe aortoiliac disease with bilateral iliac artery aneurysms measuring almost 5 cm on the right with marked calcification and tortuosity. He is not a candidate for transfemoral  approach and since he has had prior coronary bypass surgery a transapical approach would be best. Dr. Cornelius Moras and I have reviewed the scans and feel that we should expose the right axillary artery and sew a graft onto it before deploying the transcatheter valve in case we need to put the patient on cardiopulmonary bypass emergently. His iliac artery disease would preclude using femoral arterial cannulation to put him on bypass. His cardiac catheterization shows patent saphenous vein grafts to the optional diagonal and obtuse marginal branches as well as  a patent LIMA to the LAD. The saphenous vein graft to the right coronary artery is chronically occluded. The native right coronary artery is open but diffusely diseased. I don't think this will be significant since he has had no anginal symptoms.     During the course of the patient's preoperative work up they have been evaluated comprehensively by a multidisciplinary team of specialists coordinated through the Multidisciplinary Heart Valve Clinic in the [facility].  They have been demonstrated to suffer from symptomatic severe aortic stenosis as noted above. The patient has been counseled extensively as to the relative risks and benefits of all options for the treatment of severe aortic stenosis including long term medical therapy, conventional surgery for aortic valve replacement, and transcatheter aortic valve replacement.  The patient has been independently evaluated by two cardiac surgeons including myself and Dr. Tressie Stalker, and Dr. Tonny Bollman and they all felt the patient to be at high risk for conventional surgical aortic valve replacement based upon a predicted risk of mortality using the Society of Thoracic Surgeons risk calculator of 9.3%. All of [place] feel that the patient would be a poor candidate for conventional surgery (predicted risk of mortality >15% and/or predicted risk of permanent morbidity >50%) because of  comorbidities including severe rheumatoid steroid and methotrexate dependent rheumatoid arthritis, advanced age, prior coronary artery bypass surgery.   Based upon review of all of the patient's preoperative diagnostic tests they are felt to be candidate for transcatheter aortic valve replacement using the transapical approach as an alternative to high risk conventional surgery.    Following the decision to proceed with transcatheter aortic valve replacement, a discussion has been held regarding what types of management strategies would be attempted intraoperatively in the event of life-threatening complications, including whether or not the patient would be considered a candidate for the use of cardiopulmonary bypass and/or conversion to open sternotomy for attempted surgical intervention.  The patient has been advised of a variety of complications that might develop peculiar to this approach including but not limited to risks of death, stroke, paravalvular leak, aortic dissection or other major vascular complications, aortic annulus rupture, device embolization, cardiac rupture or perforation, acute myocardial infarction, arrhythmia, heart block or bradycardia requiring permanent pacemaker placement, congestive heart failure, respiratory failure, renal failure, pneumonia, infection, other late complications related to structural valve deterioration or migration, or other complications that might ultimately cause a temporary or permanent loss of functional independence or other long term morbidity.  The patient provides full informed consent for the procedure as described and all questions were answered preoperatively.    DETAILS OF THE OPERATIVE PROCEDURE  PREPARATION:    The patient is brought to the operating room on the above mentioned date and central monitoring was established by the anesthesia team including placement of Swan-Ganz catheter and radial arterial line. The patient is placed in the  supine position on the operating table.  Intravenous antibiotics are administered. General endotracheal anesthesia is induced uneventfully. A Foley catheter is placed.  Baseline transesophageal echocardiogram was performed.  Findings were notable for severe aortic stenosis and normal LV function.  The patient's chest, abdomen, both groins, and both lower extremities are prepared and draped in a sterile manner. A time out procedure is performed.   PERIPHERAL ACCESS:    Bilateral femoral arterial and venous access is obtained with placement of 6 Fr arterial sheaths on both sides.  A pigtail diagnostic catheter is passed through the right femoral arterial  sheath under fluoroscopic guidance into the aortic root.  A temporary transvenous pacemaker catheter is passed through the right femoral venous sheath under fluoroscopic guidance into the right ventricle.  The pacemaker is tested to ensure stable lead placement and pacemaker capture.  AXILLARY ARTERY GRAFT:  The patient had prior coronary artery bypass surgery and has severe iliac artery aneurysmal disease bilaterally precluding cannulation of the femoral arteries for cardiopulmonary bypass if needed. Therefore we felt it would be prudent to sew a graft onto the right axillary artery to use for arterial inflow if CP bypass was needed emergently. The right axillary artery was exposed through a transverse incision inferior to the right clavicle. The pectoralis major muscle was split along its fibers the expose the pectoralis minor muscle, which was retracted laterally to expose the axillary artery. The brachial plexus trunks were carefully avoided. Proximal and distal control were obtained. The patient is heparinized systemically and ACT verified > 250 seconds. The artery was clamped proximally and distally with peripheral Debakey clamps and opened longitudinally. An 8 mm Hemashield graft was anastomosed to the artery in end to side fashion using continuous  5-0 prolene suture. Coseal was applied and the clamps removed. There was excellent blood flow. The graft was clamped.  TRANSAPICAL ACCESS:   The location of the left ventricular apex is confirmed using flouroscopy, and a miniature left thoracotomy incision is made directly over the left ventricular apex.  The left pleural space is entered.  Moderate adhesions are encountered.  A soft tissue retractor is placed and the ribs gently spread to expose the pericardial surface.  A longitudinal incision is made in the pericardium and the left ventricular apex inspected.  There are mild intrapericardial adhesions.  An appropriate site for left ventricular sheath placement is chosen well lateral to the left anterior descending coronary artery and verified using TEE.  Two pursestring sutures are placed using pledgeted 2-0 Prolene suture in a overlapping triangular orientation.   The left ventricular apex is punctured using an 18 gauge needle and a soft J-tipped guidewire is passed into the left ventricle and through the aortic valve under fluoroscopic guidance while also continuously monitoring TEE for signs of entrapment in the mitral apparatus.  A 6 Fr sheath is placed over the guidewire and across the aortic valve.  A JR-4 catheter is passed through the sheath and maneuvered around the aortic arch into the descending thoracic aorta under fluoroscopic guidance.  An Amplatz extra stiff guidewire is passed through the JR-4 catheter into the descending thoracic aorta and both the JR-4 catheter and the introducing sheath are removed.  An Edwards Ascendra plus introducer sheath is passed over the guidewire into the left ventricle.  The sheath position is continuously monitored and secured by the surgical assistant.  Baseline simultaneous left ventricular and aortic pressures are recorded.   BALLOON AORTIC VALVULOPLASTY:  An Edwards Ascendra BAV catheter (model #[id number] BAVC, 20mm x 3 cm balloon)  is advanced over the  guidewire and through the introducer sheath into the left ventricle and across the aortic valve.  The position of the BAV catheter is verified flouroscopically with approximately 50% of the balloon length above and below the plane of the aortic annulus.  Balloon aortic valvuloplasty is performed while temporarily holding ventilation and during rapid ventricular pacing to maintain systolic blood pressure < 50 mmHg and pulse pressure < 10 mmHg.  The balloon is removed and TEE reassessed for valve and left ventricular function.  There was moderate AI.  The patient's hemodynamic recovery following BAV is good.   TRANSCATHETER HEART VALVE DEPLOYMENT:  An Edwards Sapien XT THV (size 29 mm, model # 9300TFX, serial [id number]) is prepared and crimped per manufacturer's guidelines, and the proper orientation of the valve is confirmed on the [employer]  plus delivery system.  The delivery system loader is advanced into the introducing sheath.  The valve and delivery system are advanced through the loader into the sheath and all air is evacuated.  The valve and balloon are advanced into the left ventricle and part way through the aortic valve.  The valve pusher is retracted.  The valve is then finely positioned in the aortic valve and its position verified using an injector aortogram through a pigtail catheter in the right Sinus of Valsalva. Valve position is also confirmed using TEE, and care is taken to make certain there is no sign of entanglement in the mitral apparatus.  Once final position of the valve has been confirmed, the valve is deployed while temporarily holding ventilation and during rapid ventricular pacing to maintain systolic blood pressure < 50 mmHg and pulse pressure < 10 mmHg.  The balloon inflation is held for >3 seconds after reaching full deployment volume.  Once the balloon has fully deflated the balloon is retracted into the left ventricle and valve function is assessed using TEE.  There is  felt to be trvial paravalvular leak and no central aortic insufficiency.  Left ventricular function is  unchanged from preoperatively.  There is no mitral regurgitation.  The patient's hemodynamic recovery following valve deployment is rapid.  The deployment balloon and guidewire are both removed and simultaneous left ventricular and aortic pressures are recorded.   PROCEDURE COMPLETION:  The left ventricular sheath is removed during rapid ventricular pacing to maintain systolic blood pressure < 70 mmHg while the pursestring sutures are tied.  The apical closure is inspected and notably hemostatic.  Protamine is administered.    Once hemostasis has been ascertained, the left pleural space is drained using a single 28 Fr Bard chest tube and the mini thoracotomy incision is closed in layers and the skin incision closed using a subcuticular skin closure.  The temporary pacemaker, pigtail catheters and all femoral sheaths are removed.The axillary artery graft is ligated just above the anastomosis with a heavy silk tie and divided. The stump was then oversewn with continuous 4-0 prolene suture.  The patient tolerated the procedure well and is transported to the surgical intensive care in stable condition. There are no intraoperative complications. All sponge instrument and needle counts are verified correct at completion of the operation.  No blood products were administered during the operation.      [name], MD 01/15/2013 3:45 PM

## 2013-01-15 NOTE — Progress Notes (Addendum)
Echocardiogram Echocardiogram Transesophageal in the OR has been performed.  Hunter Choi 01/15/2013, 11:29 AM

## 2013-01-15 NOTE — Transfer of Care (Signed)
Immediate Anesthesia Transfer of Care Note  Patient: CLEBURN MAIOLO  Procedure(s) Performed: Procedure(s): TRANSCATHETER AORTIC VALVE REPLACEMENT, TRANSAPICAL (N/A) INTRAOPERATIVE TRANSESOPHAGEAL ECHOCARDIOGRAM (N/A)  Patient Location: SICU  Anesthesia Type:General  Level of Consciousness: Patient remains intubated per anesthesia plan  Airway & Oxygen Therapy: Patient remains intubated per anesthesia plan and Patient placed on Ventilator (see vital sign flow sheet for setting)  Post-op Assessment: Report given to PACU RN  Post vital signs: Reviewed and stable  Complications: No apparent anesthesia complications

## 2013-01-15 NOTE — Anesthesia Postprocedure Evaluation (Signed)
  Anesthesia Post-op Note  Patient: Hunter Choi  Procedure(s) Performed: Procedure(s): TRANSCATHETER AORTIC VALVE REPLACEMENT, TRANSAPICAL (N/A) INTRAOPERATIVE TRANSESOPHAGEAL ECHOCARDIOGRAM (N/A)  Patient Location: SICU  Anesthesia Type:General  Level of Consciousness: Patient remains intubated per anesthesia plan  Airway and Oxygen Therapy: Patient remains intubated per anesthesia plan and Patient placed on Ventilator (see vital sign flow sheet for setting)  Post-op Pain: none  Post-op Assessment: Post-op Vital signs reviewed, Patient's Cardiovascular Status Stable and Respiratory Function Stable  Post-op Vital Signs: Reviewed and stable  Complications: No apparent anesthesia complications

## 2013-01-15 NOTE — Procedures (Signed)
Extubation Procedure Note  Patient Details:   Name: Hunter Choi DOB: 15-Mar-1928 MRN: [id number]   Airway Documentation:     Evaluation  O2 sats: stable throughout Complications: No apparent complications Patient did tolerate procedure well. Bilateral Breath Sounds: Clear;Diminished   Yes Patient tolerated rapid wean. -35NIF and 1.5L VC achieved. Positive for cuff leak. Patient extubated to a 4 Lpm nasal cannula. No signs of stridor or dypsnea noted. Patient instructed to use Incentive Spirometry. Patient achieved 1500 x 3 on IS. Patient resting comfortably. Will continue to monitor.  Ancil Boozer 01/15/2013, 6:05 PM

## 2013-01-15 NOTE — Progress Notes (Signed)
PM ROUNDS  S/p TAVR  Extubated  BP 141/66  Pulse 70  Temp(Src) 97.3 F (36.3 C) (Core (Comment))  Resp 22  Ht 5' 6.14" (1.68 m)  Wt 184 lb 8.4 oz (83.7 kg)  BMI 29.66 kg/m2  SpO2 99%   Intake/Output Summary (Last 24 hours) at 01/15/13 1754 Last data filed at 01/15/13 1730  Gross per 24 hour  Intake 3949.33 ml  Output   2974 ml  Net 975.33 ml   Hct 37  K 7.2- will repeat

## 2013-01-15 NOTE — Anesthesia Preprocedure Evaluation (Addendum)
Anesthesia Evaluation  Patient identified by MRN, date of birth, ID band Patient awake    Reviewed: Allergy & Precautions, H&P , NPO status , Patient's Chart, lab work & pertinent test results, reviewed documented beta blocker date and time   Airway Mallampati: II  Neck ROM: Full    Dental  (+) Teeth Intact, Missing and Dental Advisory Given   Pulmonary shortness of breath,          Cardiovascular hypertension, Pt. on home beta blockers + CAD and + Peripheral Vascular Disease + Valvular Problems/Murmurs AS Rhythm:Regular Rate:Normal     Neuro/Psych CVA    GI/Hepatic GERD-  ,  Endo/Other  Hypothyroidism   Renal/GU      Musculoskeletal  (+) Arthritis -, Rheumatoid disorders,    Abdominal   Peds  Hematology   Anesthesia Other Findings   Reproductive/Obstetrics                           Anesthesia Physical Anesthesia Plan  ASA: IV  Anesthesia Plan: General   Post-op Pain Management:    Induction: Intravenous  Airway Management Planned: Oral ETT  Additional Equipment: Arterial line, CVP, PA Cath and 3D TEE  Intra-op Plan:   Post-operative Plan: Post-operative intubation/ventilation  Informed Consent:   Plan Discussed with:   Anesthesia Plan Comments:         Anesthesia Quick Evaluation

## 2013-01-15 NOTE — Preoperative (Signed)
Beta Blockers   Reason not to administer Beta Blockers:Not Applicable 

## 2013-01-15 NOTE — Interval H&P Note (Signed)
History and Physical Interval Note:  01/15/2013 6:59 AM  Hunter Choi  has presented today for surgery, with the diagnosis of SEVERE AS  The various methods of treatment have been discussed with the patient and family. After consideration of risks, benefits and other options for treatment, the patient has consented to  Procedure(s): TRANSCATHETER AORTIC VALVE REPLACEMENT, TRANSAPICAL (N/A) INTRAOPERATIVE TRANSESOPHAGEAL ECHOCARDIOGRAM (N/A) as a surgical intervention .  The patient's history has been reviewed, patient examined, no change in status, stable for surgery.  I have reviewed the patient's chart and labs.  Questions were answered to the patient's satisfaction.     [name]

## 2013-01-15 NOTE — Brief Op Note (Signed)
01/15/2013  11:29 AM  PATIENT:  Hunter Choi  79 y.o. male  PRE-OPERATIVE DIAGNOSIS:  SEVERE AS  POST-OPERATIVE DIAGNOSIS:  severe aortic stenosis  PROCEDURE:  Procedure(s): TRANSCATHETER AORTIC VALVE REPLACEMENT, TRANSAPICAL (N/A) INTRAOPERATIVE TRANSESOPHAGEAL ECHOCARDIOGRAM (N/A) 29 mm Sapien XT  SURGEON:  Surgeon(s) and Role:    * [name], MD - Primary    * Purcell Nails, MD - Assisting    * Tonny Bollman, MD - Assisting    * Kathleene Hazel, MD - Assisting    ANESTHESIA:   none  EBL:  Total I/O In: 2825 [I.V.:2000; Blood:575; IV Piggyback:250] Out: 1100 [Urine:550; Blood:550]  BLOOD ADMINISTERED:none  DRAINS: 1 Chest Tube(s) in the left  pleural space   COUNTS:  YES   DICTATION: .Note written in EPIC  PLAN OF CARE: Admit to inpatient   PATIENT DISPOSITION:  ICU - intubated and hemodynamically stable.   Delay start of Pharmacological VTE agent (>24hrs) due to surgical blood loss or risk of bleeding: yes

## 2013-01-15 NOTE — Op Note (Signed)
[facility]  TAVR OPERATIVE NOTE   Date of Procedure:  01/15/2013  Preoperative Diagnosis: Severe Aortic Stenosis   Postoperative Diagnosis: Same   Procedure:    Transcatheter Aortic Valve Replacement - Transapical Approach  Edwards Sapien XT THV (size 29 mm, model # 9300TFX, serial # [id number])   Co-Surgeons:  Evelene Croon, MD and Tonny Bollman, MD  Assistants:   Tressie Stalker, MD and Verne Carrow, MD  Anesthesiologist:  Kipp Brood, MD  Echocardiographer:  Charlton Haws, MD  Pre-operative Echo Findings:  severe aortic stenosis  normal left ventricular systolic function  Trace aortic insufficiency and mitral insufficiency  Post-operative Echo Findings:  trivial paravalvular leak  normal left ventricular systolic function  BRIEF CLINICAL NOTE AND INDICATIONS FOR SURGERY  During the course of the patient's preoperative work up they have been evaluated comprehensively by a multidisciplinary team of specialists coordinated through the Multidisciplinary Heart Valve Clinic in the [facility].  They have been demonstrated to suffer from symptomatic severe aortic stenosis as noted above. The patient has been counseled extensively as to the relative risks and benefits of all options for the treatment of severe aortic stenosis including long term medical therapy, conventional surgery for aortic valve replacement, and transcatheter aortic valve replacement.  The patient has been independently evaluated by two cardiac surgeons including Dr Cornelius Moras and Dr. Laneta Simmers, and they are felt to be at high risk for conventional surgical aortic valve replacement based upon a predicted risk of mortality using the Society of Thor[employer] Surgeons risk calculator of 9.3% Both surgeons indicated the patient would be a poor candidate for conventional surgery (predicted risk of mortality >15% and/or predicted risk of permanent morbidity >50%) because of  comorbidities including previous CABG, severe arthritis on methotrexate and prednisone.   Based upon review of all of the patient's preoperative diagnostic tests they are felt to be candidate for transcatheter aortic valve replacement using the transapical approach as an alternative to high risk conventional surgery.  He is not a candidate for transfemoral approach because of aortoiliac aneurysmal disease.  Following the decision to proceed with transcatheter aortic valve replacement, a discussion has been held regarding what types of management strategies would be attempted intraoperatively in the event of life-threatening complications, including whether or not the patient would be considered a candidate for the use of cardiopulmonary bypass and/or conversion to open sternotomy for attempted surgical intervention.  The patient has been advised of a variety of complications that might develop peculiar to this approach including but not limited to risks of death, stroke, paravalvular leak, aortic dissection or other major vascular complications, aortic annulus rupture, device embolization, cardiac rupture or perforation, acute myocardial infarction, arrhythmia, heart block or bradycardia requiring permanent pacemaker placement, congestive heart failure, respiratory failure, renal failure, pneumonia, infection, other late complications related to structural valve deterioration or migration, or other complications that might ultimately cause a temporary or permanent loss of functional independence or other long term morbidity.  The patient provides full informed consent for the procedure as described and all questions were answered preoperatively.   DETAILS OF THE OPERATIVE PROCEDURE  PREPARATION:    The patient is brought to the operating room on the above mentioned date and central monitoring was established by the anesthesia team including placement of Swan-Ganz catheter and radial arterial line. The  patient is placed in the supine position on the operating table.  Intravenous antibiotics are administered. General endotracheal anesthesia is induced uneventfully. A Foley catheter is  placed.  Baseline transesophageal echocardiogram was performed.    The patient's chest, abdomen, both groins, and both lower extremities are prepared and draped in a sterile manner. A time out procedure is performed.   PERIPHERAL ACCESS:    Using the modified Seldinger technique, femoral arterial and venous access was obtained with placement of 6 Fr sheaths on the right side and another venous sheath was placed on the left side.  A pigtail diagnostic catheter was passed through the firght femoral arterial sheath under fluoroscopic guidance into the aortic root.  A temporary transvenous pacemaker catheter was passed through the right femoral venous sheath under fluoroscopic guidance into the right ventricle.  The pacemaker was tested to ensure stable lead placement and pacemaker capture. Aortic root angiography was performed in order to determine the optimal angiographic angle for valve deployment.  PROCEDURAL DETAILS: The pigtail was advanced into the right coronary cusp and aortography was performed to obtain the ideal deployment angle. The temporary pacing wire was advanced into the RV apex and an adequate pacing threshold was obtained. For the remaining details of the procedure, please see Dr Sharee Pimple complete note.   PROCEDURE COMPLETION:  The apical sheath was removed and the apex repaired by Dr Laneta Simmers. Please see his note for details.  The temporary pacemaker, pigtail catheters and femoral sheaths were removed with manual pressure used for hemostasis.  The patient tolerated the procedure well and is transported to the surgical intensive care in stable condition. There were no immediate intraoperative complications. All sponge instrument and needle counts are verified correct at completion of the operation.    No blood products were administered during the operation.  The patient received a total of 45 mL of intravenous contrast during the procedure.  Tonny Bollman 01/15/2013 10:53 AM

## 2013-01-16 ENCOUNTER — Inpatient Hospital Stay (HOSPITAL_COMMUNITY): Payer: Medicare Other

## 2013-01-16 ENCOUNTER — Other Ambulatory Visit: Payer: Self-pay | Admitting: *Deleted

## 2013-01-16 DIAGNOSIS — I359 Nonrheumatic aortic valve disorder, unspecified: Principal | ICD-10-CM

## 2013-01-16 DIAGNOSIS — I517 Cardiomegaly: Secondary | ICD-10-CM

## 2013-01-16 LAB — CBC
HCT: 33.8 % — ABNORMAL LOW (ref 39.0–52.0)
HCT: 36.2 % — ABNORMAL LOW (ref 39.0–52.0)
Hemoglobin: 12.2 g/dL — ABNORMAL LOW (ref 13.0–17.0)
MCH: 30.5 pg (ref 26.0–34.0)
MCH: 30.7 pg (ref 26.0–34.0)
MCHC: 33.7 g/dL (ref 30.0–36.0)
MCV: 91.4 fL (ref 78.0–100.0)
MCV: 91.2 fL (ref 78.0–100.0)
Platelets: 145 10*3/uL — ABNORMAL LOW (ref 150–400)
Platelets: 141 10*3/uL — ABNORMAL LOW (ref 150–400)
RBC: 3.7 MIL/uL — ABNORMAL LOW (ref 4.22–5.81)
RDW: 16.8 % — ABNORMAL HIGH (ref 11.5–15.5)
WBC: 11.2 10*3/uL — ABNORMAL HIGH (ref 4.0–10.5)
WBC: 12.8 10*3/uL — ABNORMAL HIGH (ref 4.0–10.5)

## 2013-01-16 LAB — POCT I-STAT, CHEM 8
BUN: 6 mg/dL (ref 6–23)
Chloride: 94 mEq/L — ABNORMAL LOW (ref 96–112)
Creatinine, Ser: 0.9 mg/dL (ref 0.50–1.35)
Glucose, Bld: 129 mg/dL — ABNORMAL HIGH (ref 70–99)
HCT: 40 % (ref 39.0–52.0)
Hemoglobin: 13.6 g/dL (ref 13.0–17.0)
Potassium: 3.4 mEq/L — ABNORMAL LOW (ref 3.5–5.1)
Sodium: 132 mEq/L — ABNORMAL LOW (ref 135–145)

## 2013-01-16 LAB — GLUCOSE, CAPILLARY
Glucose-Capillary: 100 mg/dL — ABNORMAL HIGH (ref 70–99)
Glucose-Capillary: 103 mg/dL — ABNORMAL HIGH (ref 70–99)
Glucose-Capillary: 116 mg/dL — ABNORMAL HIGH (ref 70–99)
Glucose-Capillary: 119 mg/dL — ABNORMAL HIGH (ref 70–99)
Glucose-Capillary: 121 mg/dL — ABNORMAL HIGH (ref 70–99)
Glucose-Capillary: 122 mg/dL — ABNORMAL HIGH (ref 70–99)
Glucose-Capillary: 127 mg/dL — ABNORMAL HIGH (ref 70–99)
Glucose-Capillary: 134 mg/dL — ABNORMAL HIGH (ref 70–99)
Glucose-Capillary: 139 mg/dL — ABNORMAL HIGH (ref 70–99)
Glucose-Capillary: 151 mg/dL — ABNORMAL HIGH (ref 70–99)
Glucose-Capillary: 65 mg/dL — ABNORMAL LOW (ref 70–99)
Glucose-Capillary: 99 mg/dL (ref 70–99)

## 2013-01-16 LAB — BASIC METABOLIC PANEL
CO2: 22 mEq/L (ref 19–32)
Chloride: 94 mEq/L — ABNORMAL LOW (ref 96–112)
Creatinine, Ser: 0.8 mg/dL (ref 0.50–1.35)
Sodium: 130 mEq/L — ABNORMAL LOW (ref 135–145)

## 2013-01-16 LAB — MAGNESIUM: Magnesium: 2.2 mg/dL (ref 1.5–2.5)

## 2013-01-16 LAB — CREATININE, SERUM: GFR calc non Af Amer: 83 mL/min — ABNORMAL LOW (ref >90)

## 2013-01-16 MED ORDER — ONDANSETRON HCL 4 MG PO TABS: 4.0000 mg | ORAL_TABLET | Freq: Four times a day (QID) | ORAL | Status: DC | PRN

## 2013-01-16 MED ORDER — METOPROLOL TARTRATE 25 MG/10 ML ORAL SUSPENSION
12.5000 mg | Freq: Two times a day (BID) | ORAL | Status: DC
  Filled 2013-01-16 (×2): qty 5

## 2013-01-16 MED ORDER — FUROSEMIDE 10 MG/ML IJ SOLN
40.0000 mg | Freq: Once | INTRAMUSCULAR | Status: AC
Start: 2013-01-16 — End: 2013-01-16
  Administered 2013-01-16: 40 mg via INTRAVENOUS
  Filled 2013-01-16: qty 4

## 2013-01-16 MED ORDER — DOCUSATE SODIUM 100 MG PO CAPS
200.0000 mg | ORAL_CAPSULE | Freq: Every day | ORAL | Status: DC
  Administered 2013-01-17 – 2013-01-22 (×4): 200 mg via ORAL
  Filled 2013-01-16 (×6): qty 2

## 2013-01-16 MED ORDER — ASPIRIN EC 81 MG PO TBEC
81.0000 mg | DELAYED_RELEASE_TABLET | Freq: Every day | ORAL | Status: DC
  Filled 2013-01-16: qty 1

## 2013-01-16 MED ORDER — INSULIN ASPART 100 UNIT/ML ~~LOC~~ SOLN: 0.0000 [IU] | SUBCUTANEOUS | Status: DC

## 2013-01-16 MED ORDER — POTASSIUM CHLORIDE CRYS ER 20 MEQ PO TBCR
40.0000 meq | EXTENDED_RELEASE_TABLET | Freq: Every day | ORAL | Status: AC
Start: 2013-01-17 — End: 2013-01-17
  Administered 2013-01-17: 40 meq via ORAL
  Filled 2013-01-16: qty 2

## 2013-01-16 MED ORDER — FAMOTIDINE 20 MG PO TABS
20.0000 mg | ORAL_TABLET | Freq: Two times a day (BID) | ORAL | Status: DC
  Administered 2013-01-16 – 2013-01-22 (×12): 20 mg via ORAL
  Filled 2013-01-16 (×14): qty 1

## 2013-01-16 MED ORDER — TRAMADOL HCL 50 MG PO TABS: 50.0000 mg | ORAL_TABLET | ORAL | Status: DC | PRN

## 2013-01-16 MED ORDER — ASPIRIN EC 81 MG PO TBEC
81.0000 mg | DELAYED_RELEASE_TABLET | Freq: Every day | ORAL | Status: DC
  Administered 2013-01-17 – 2013-01-22 (×6): 81 mg via ORAL
  Filled 2013-01-16 (×6): qty 1

## 2013-01-16 MED ORDER — BISACODYL 5 MG PO TBEC: 10.0000 mg | DELAYED_RELEASE_TABLET | Freq: Every day | ORAL | Status: DC | PRN

## 2013-01-16 MED ORDER — POTASSIUM CHLORIDE 10 MEQ/50ML IV SOLN
10.0000 meq | INTRAVENOUS | Status: AC | PRN
  Administered 2013-01-16 (×3): 10 meq via INTRAVENOUS

## 2013-01-16 MED ORDER — AMLODIPINE BESYLATE 5 MG PO TABS
5.0000 mg | ORAL_TABLET | Freq: Every day | ORAL | Status: DC
  Administered 2013-01-16 – 2013-01-22 (×7): 5 mg via ORAL
  Filled 2013-01-16 (×7): qty 1

## 2013-01-16 MED ORDER — HYDROCORTISONE SOD SUCCINATE 100 MG IJ SOLR
50.0000 mg | Freq: Four times a day (QID) | INTRAMUSCULAR | Status: AC
  Administered 2013-01-16 – 2013-01-17 (×4): 50 mg via INTRAVENOUS
  Filled 2013-01-16 (×4): qty 1

## 2013-01-16 MED ORDER — OXYCODONE HCL 5 MG PO TABS: 5.0000 mg | ORAL_TABLET | ORAL | Status: DC | PRN

## 2013-01-16 MED ORDER — INSULIN ASPART 100 UNIT/ML ~~LOC~~ SOLN
0.0000 [IU] | SUBCUTANEOUS | Status: DC
  Administered 2013-01-16 (×2): 2 [IU] via SUBCUTANEOUS

## 2013-01-16 MED ORDER — ONDANSETRON HCL 4 MG/2ML IJ SOLN: 4.0000 mg | Freq: Four times a day (QID) | INTRAMUSCULAR | Status: DC | PRN

## 2013-01-16 MED ORDER — SODIUM CHLORIDE 0.9 % IV SOLN: 250.0000 mL | INTRAVENOUS | Status: DC | PRN

## 2013-01-16 MED ORDER — INSULIN ASPART 100 UNIT/ML ~~LOC~~ SOLN: 0.0000 [IU] | Freq: Every day | SUBCUTANEOUS | Status: DC

## 2013-01-16 MED ORDER — ASPIRIN 81 MG PO CHEW
324.0000 mg | CHEWABLE_TABLET | Freq: Every day | ORAL | Status: DC
  Administered 2013-01-16: 324 mg
  Filled 2013-01-16: qty 4

## 2013-01-16 MED ORDER — BISACODYL 10 MG RE SUPP: 10.0000 mg | Freq: Every day | RECTAL | Status: DC | PRN

## 2013-01-16 MED ORDER — ACETAMINOPHEN 325 MG PO TABS
650.0000 mg | ORAL_TABLET | Freq: Four times a day (QID) | ORAL | Status: DC | PRN
  Administered 2013-01-19 – 2013-01-22 (×4): 650 mg via ORAL
  Filled 2013-01-16 (×4): qty 2

## 2013-01-16 MED ORDER — INFLUENZA VAC SPLIT QUAD 0.5 ML IM SUSP
0.5000 mL | Freq: Once | INTRAMUSCULAR | Status: AC
  Administered 2013-01-18: 0.5 mL via INTRAMUSCULAR
  Filled 2013-01-16: qty 0.5

## 2013-01-16 MED ORDER — METOPROLOL TARTRATE 25 MG PO TABS
25.0000 mg | ORAL_TABLET | Freq: Two times a day (BID) | ORAL | Status: DC
  Administered 2013-01-16 – 2013-01-22 (×11): 25 mg via ORAL
  Filled 2013-01-16 (×13): qty 1

## 2013-01-16 MED ORDER — PNEUMOCOCCAL VAC POLYVALENT 25 MCG/0.5ML IJ INJ
0.5000 mL | INJECTION | INTRAMUSCULAR | Status: DC | PRN
  Filled 2013-01-16: qty 0.5

## 2013-01-16 MED ORDER — SODIUM CHLORIDE 0.9 % IJ SOLN
3.0000 mL | Freq: Two times a day (BID) | INTRAMUSCULAR | Status: DC
  Administered 2013-01-16 – 2013-01-22 (×11): 3 mL via INTRAVENOUS

## 2013-01-16 MED ORDER — SODIUM CHLORIDE 0.9 % IJ SOLN: 3.0000 mL | INTRAMUSCULAR | Status: DC | PRN

## 2013-01-16 MED ORDER — FUROSEMIDE 40 MG PO TABS
40.0000 mg | ORAL_TABLET | Freq: Every day | ORAL | Status: AC
  Administered 2013-01-17: 40 mg via ORAL
  Filled 2013-01-16: qty 1

## 2013-01-16 MED ORDER — MOVING RIGHT ALONG BOOK
Freq: Once | Status: AC
  Administered 2013-01-16: 1
  Filled 2013-01-16: qty 1

## 2013-01-16 MED ORDER — INSULIN ASPART 100 UNIT/ML ~~LOC~~ SOLN
0.0000 [IU] | Freq: Three times a day (TID) | SUBCUTANEOUS | Status: DC
  Administered 2013-01-17: 1 [IU] via SUBCUTANEOUS

## 2013-01-16 MED ORDER — METOPROLOL TARTRATE 25 MG PO TABS
25.0000 mg | ORAL_TABLET | Freq: Two times a day (BID) | ORAL | Status: DC
  Administered 2013-01-16: 25 mg via ORAL
  Filled 2013-01-16 (×2): qty 1

## 2013-01-16 MED FILL — Sodium Chloride Irrigation Soln 0.9%: Qty: 3000 | Status: AC

## 2013-01-16 MED FILL — Dextrose Inj 5%: INTRAVENOUS | Qty: 250 | Status: AC

## 2013-01-16 MED FILL — Sodium Chloride IV Soln 0.9%: INTRAVENOUS | Qty: 1000 | Status: AC

## 2013-01-16 MED FILL — Heparin Sodium (Porcine) Inj 1000 Unit/ML: INTRAMUSCULAR | Qty: 30 | Status: AC

## 2013-01-16 MED FILL — Magnesium Sulfate Inj 50%: INTRAMUSCULAR | Qty: 10 | Status: AC

## 2013-01-16 MED FILL — Norepinephrine Bitartrate IV Soln 1 MG/ML (Base Equivalent): INTRAMUSCULAR | Qty: 8 | Status: AC

## 2013-01-16 MED FILL — Potassium Chloride Inj 2 mEq/ML: INTRAVENOUS | Qty: 40 | Status: AC

## 2013-01-16 NOTE — Progress Notes (Signed)
Anesthesiology Follow-up:  Awake and alert, breathing much improved from pre-op.Hemodynamically stable, neuro intact.  VS: T-36.8 BP 109/52  HR 81 (SR with first degree AV block) RR 15 O2 Sat 96% on 2L PA 38/18 CO/CI: 5.1/2.6  Na-130 K-3.5 BUN/Cr. 8/0.8 H/H 11.3/33.8 Platelet count 145,000  Extubated 6 hours post-op.   79 year old WM one day S/P TAVR via trans-apical approach. Doing very well so far.  Kipp Brood, MD

## 2013-01-16 NOTE — Progress Notes (Signed)
  Echocardiogram 2D Echocardiogram has been performed.  Hunter Choi 01/16/2013, 10:19 AM

## 2013-01-16 NOTE — Progress Notes (Signed)
Received pt from [facility]. Pt is stable and resting in bed. Pt has no ambulated yet on [facility] today. Will see if pt can ambulate later today. Pt ambulated in room on arrival

## 2013-01-16 NOTE — Progress Notes (Signed)
1 Day Post-Op Procedure(s) (LRB): TRANSCATHETER AORTIC VALVE REPLACEMENT, TRANSAPICAL (N/A) INTRAOPERATIVE TRANSESOPHAGEAL ECHOCARDIOGRAM (N/A) Subjective:  No complaints  Objective: Vital signs in last 24 hours: Temp:  [95.9 F (35.5 C)-98.8 F (37.1 C)] 98.6 F (37 C) (10/22 0800) Pulse Rate:  [0-85] 78 (10/22 0800) Cardiac Rhythm:  [-] Normal sinus rhythm (10/22 0600) Resp:  [10-29] 18 (10/22 0800) BP: (99-172)/(41-101) 137/53 mmHg (10/22 0800) SpO2:  [92 %-100 %] 96 % (10/22 0800) Arterial Line BP: (25-178)/(9-127) 94/85 mmHg (10/22 0800) FiO2 (%):  [40 %-50 %] 40 % (10/21 1700) Weight:  [87.1 kg (192 lb 0.3 oz)] 87.1 kg (192 lb 0.3 oz) (10/22 0630)  Hemodynamic parameters for last 24 hours: PAP: (22-41)/(11-27) 31/16 mmHg CO:  [3.3 L/min-6.6 L/min] 5 L/min CI:  [1.7 L/min/m2-3.4 L/min/m2] 2.6 L/min/m2  Intake/Output from previous day: 10/21 0701 - 10/22 0700 In: 4575.8 [I.V.:2820.8; Blood:575; NG/GT:30; IV Piggyback:1150] Out: 4860 [Urine:4090; Emesis/NG output:100; Blood:550; Chest Tube:120] Intake/Output this shift: Total I/O In: 160 [I.V.:60; IV Piggyback:100] Out: 30 [Urine:30]  General appearance: alert and cooperative Neurologic: intact Heart: regular rate and rhythm, S1, S2 normal, no murmur, click, rub or gallop Lungs: clear to auscultation bilaterally Extremities: extremities normal, atraumatic, no cyanosis or edema Wound: dressings dry  Lab Results:  Recent Labs  01/15/13 1700 01/15/13 1714 01/16/13 0420  WBC 10.5  --  11.2*  HGB 11.8* 12.6* 11.3*  HCT 34.9* 37.0* 33.8*  PLT 155  --  145*   BMET:  Recent Labs  01/15/13 1200  01/15/13 1714 01/15/13 1835 01/16/13 0420  NA 133*  --  130*  --  130*  K 3.5  --  7.2* 3.5 3.5  CL 98  --  101  --  94*  CO2 26  --   --   --  22  GLUCOSE 101*  --  131*  --  122*  BUN 9  --  10  --  8  CREATININE 0.71  < > 0.90  --  0.80  CALCIUM 8.0*  --   --   --  7.9*  < > = values in this interval not  displayed.  PT/INR:  Recent Labs  01/15/13 1400  LABPROT 14.8  INR 1.19   ABG    Component Value Date/Time   PHART 7.465* 01/15/2013 1844   HCO3 17.9* 01/15/2013 1844   TCO2 19 01/15/2013 1844   ACIDBASEDEF 5.0* 01/15/2013 1844   O2SAT 96.0 01/15/2013 1844   CBG (last 3)   Recent Labs  01/16/13 0040 01/16/13 0402 01/16/13 0448  GLUCAP 100* 65* 139*    Assessment/Plan: S/P Procedure(s) (LRB): TRANSCATHETER AORTIC VALVE REPLACEMENT, TRANSAPICAL (N/A) INTRAOPERATIVE TRANSESOPHAGEAL ECHOCARDIOGRAM (N/A) Mobilize Diuresis d/c tubes/lines Plan for transfer to step-down: see transfer orders Wean steroids Plan 2D echo for follow-up today.   LOS: 1 day    Hunter Choi K 01/16/2013

## 2013-01-16 NOTE — Progress Notes (Signed)
[facility] DAILY ICU PROGRESS NOTE                   [address]            [name] [address]          [phone]   1 Day Post-Op Procedure(s) (LRB): TRANSCATHETER AORTIC VALVE REPLACEMENT, TRANSAPICAL (N/A) INTRAOPERATIVE TRANSESOPHAGEAL ECHOCARDIOGRAM (N/A)  Total Length of Stay:  LOS: 1 day   Subjective: Feeling well, no complaints except mild soreness.   Objective: Vital signs in last 24 hours: Temp:  [95.9 F (35.5 C)-98.8 F (37.1 C)] 98.6 F (37 C) (10/22 0745) Pulse Rate:  [0-85] 76 (10/22 0745) Cardiac Rhythm:  [-] Normal sinus rhythm (10/22 0600) Resp:  [10-29] 21 (10/22 0745) BP: (99-172)/(41-101) 120/55 mmHg (10/22 0745) SpO2:  [92 %-100 %] 95 % (10/22 0745) Arterial Line BP: (25-178)/(9-127) 89/78 mmHg (10/22 0745) FiO2 (%):  [40 %-50 %] 40 % (10/21 1700) Weight:  [192 lb 0.3 oz (87.1 kg)] 192 lb 0.3 oz (87.1 kg) (10/22 0630)  Filed Weights   01/14/13 1500 01/14/13 1740 01/16/13 0630  Weight: 184 lb 9.6 oz (83.734 kg) 184 lb 8.4 oz (83.7 kg) 192 lb 0.3 oz (87.1 kg)    Weight change: 7 lb 6.7 oz (3.366 kg)   Hemodynamic parameters for last 24 hours: PAP: (22-41)/(11-27) 26/14 mmHg CO:  [3.3 L/min-6.6 L/min] 5 L/min CI:  [1.7 L/min/m2-3.4 L/min/m2] 2.6 L/min/m2  Intake/Output from previous day: 10/21 0701 - 10/22 0700 In: 4525.8 [I.V.:2820.8; Blood:575; NG/GT:30; IV Piggyback:1100] Out: 4820 [Urine:4050; Emesis/NG output:100; Blood:550; Chest Tube:120]  CBGs 103-122-99-134-119-121-113-100-139-122     Current Meds: Scheduled Meds: . acetaminophen  1,000 mg Oral Q6H   Or  . acetaminophen (TYLENOL) oral liquid 160 mg/5 mL  1,000 mg Per Tube Q6H  . aspirin EC  325 mg Oral Daily   Or  . aspirin  324 mg Per Tube Daily  . cefUROXime (ZINACEF)  IV  1.5 g Intravenous Q12H  . Chlorhexidine Gluconate Cloth  6 each Topical Daily  . clopidogrel  75 mg Oral Q breakfast  . famotidine (PEPCID) IV  20 mg Intravenous Q12H  . insulin aspart  0-24  Units Subcutaneous Q4H  . insulin regular  0-10 Units Intravenous TID WC  . levothyroxine  25 mcg Oral 4 times weekly  . [START ON 01/19/2013] methotrexate  25 mg Oral Q Sat  . metoprolol tartrate  12.5 mg Oral BID   Or  . metoprolol tartrate  12.5 mg Per Tube BID  . mupirocin ointment  1 application Nasal BID  . [START ON 01/17/2013] pantoprazole  40 mg Oral Daily  . simvastatin  20 mg Oral QPM   Continuous Infusions: . dexmedetomidine Stopped (01/15/13 1430)  . nitroGLYCERIN 40 mcg/min (01/16/13 0300)  . phenylephrine (NEO-SYNEPHRINE) Adult infusion     PRN Meds:.albumin human, metoprolol, midazolam, morphine injection, ondansetron (ZOFRAN) IV, oxyCODONE, potassium chloride  Physical Exam: General appearance: alert, cooperative and no distress Heart: regular rate and rhythm Lungs: Slightly decreased BS in bases Extremities: SCDs in place, no obvious edema Wound: Dressed and dry   Lab Results: CBC: Recent Labs  01/15/13 1700 01/15/13 1714 01/16/13 0420  WBC 10.5  --  11.2*  HGB 11.8* 12.6* 11.3*  HCT 34.9* 37.0* 33.8*  PLT 155  --  145*   BMET:  Recent Labs  01/15/13 1200  01/15/13 1714 01/15/13 1835 01/16/13 0420  NA 133*  --  130*  --  130*  K 3.5  --  7.2* 3.5 3.5  CL 98  --  101  --  94*  CO2 26  --   --   --  22  GLUCOSE 101*  --  131*  --  122*  BUN 9  --  10  --  8  CREATININE 0.71  < > 0.90  --  0.80  CALCIUM 8.0*  --   --   --  7.9*  < > = values in this interval not displayed.  PT/INR:  Recent Labs  01/15/13 1400  LABPROT 14.8  INR 1.19   Radiology: Dg Chest Portable 1 View In Am  01/16/2013   CLINICAL DATA:  Postop aortic valve replacement  EXAM: PORTABLE CHEST - 1 VIEW  COMPARISON:  01/15/2013  FINDINGS: Interval extubation.  Mild left lower lobe scarring versus atelectasis. No frank interstitial edema. No pleural effusion or pneumothorax.  Cardiomegaly. Postsurgical changes related to prior CABG. Prosthetic aortic valve.  Right IJ Swan-Ganz  catheter terminates in the main pulmonary artery. Additional right IJ venous catheter terminates in the mid SVC.  IMPRESSION: No evidence of acute cardiopulmonary disease.   Electronically Signed   By: Charline Bills M.D.   On: 01/16/2013 07:42   Dg Chest Portable 1 View  01/15/2013   CLINICAL DATA:  Postop day 0 - transcatheter aortic valve replacement for severe aortic stenosis.  EXAM: PORTABLE CHEST - 1 VIEW  COMPARISON:  01/11/2013  FINDINGS: Endotracheal tube tip is 2.8 cm above the carina. Right internal jugular central venous catheter tip: Lower SVC. Right internal jugular Swan-Ganz catheter tip: Right pulmonary artery.  Nasogastric tube tip:  Stomach fundus.  Left-sided chest tube in place ; no pneumothorax. Stent like structure in vicinity of aortic valve. Postoperative findings from prior CABG.  Atherosclerotic calcification of the aortic arch noted. Low lung volumes are noted with mild atelectasis in both lung bases. Low lung volumes are present, causing crowding of the pulmonary vasculature. Borderline cardiomegaly with cardiothoracic index 60% on this AP projection. Indistinct pulmonary vasculature raises the possibility of pulmonary venous hypertension.  IMPRESSION: 1. Tubes and lines satisfactorily positioned as noted above. 2. Borderline cardiomegaly with mild pulmonary venous hypertension. 3. Low lung volumes with bibasilar subsegmental atelectasis.   Electronically Signed   By: Herbie Baltimore M.D.   On: 01/15/2013 12:10     Assessment/Plan: S/P Procedure(s) (LRB): TRANSCATHETER AORTIC VALVE REPLACEMENT, TRANSAPICAL (N/A) INTRAOPERATIVE TRANSESOPHAGEAL ECHOCARDIOGRAM (N/A)  CV- BPs trending up.  Off drips.  Lopressor restarted, may need to resume Norvasc. Mostly maintaining SR with few episodes PVCs/VT.  Watch rhythm.  Hypokalemia- K being replaced.  CBGs stable, off insulin gtt.  Not diabetic.  Mobilize, hopefully can start to d/c lines and tubes, routine POD#1  progression.  For post-TAVR echo this am.    Jaylena Holloway H 01/16/2013 7:56 AM

## 2013-01-17 ENCOUNTER — Encounter (HOSPITAL_COMMUNITY): Payer: Self-pay | Admitting: Surgery

## 2013-01-17 LAB — GLUCOSE, CAPILLARY
Glucose-Capillary: 127 mg/dL — ABNORMAL HIGH (ref 70–99)
Glucose-Capillary: 112 mg/dL — ABNORMAL HIGH (ref 70–99)
Glucose-Capillary: 113 mg/dL — ABNORMAL HIGH (ref 70–99)

## 2013-01-17 LAB — TYPE AND SCREEN
ABO/RH(D): B POS
Unit division: 0
Unit division: 0
Unit division: 0
Unit division: 0

## 2013-01-17 LAB — POCT I-STAT, CHEM 8
BUN: 10 mg/dL (ref 6–23)
Chloride: 101 mEq/L (ref 96–112)
Creatinine, Ser: 0.9 mg/dL (ref 0.50–1.35)
Glucose, Bld: 131 mg/dL — ABNORMAL HIGH (ref 70–99)
Potassium: 7.2 mEq/L (ref 3.5–5.1)
Sodium: 130 mEq/L — ABNORMAL LOW (ref 135–145)

## 2013-01-17 MED ORDER — SIMVASTATIN 20 MG PO TABS
20.0000 mg | ORAL_TABLET | Freq: Every day | ORAL | Status: DC
  Administered 2013-01-17 – 2013-01-21 (×5): 20 mg via ORAL
  Filled 2013-01-17 (×6): qty 1

## 2013-01-17 NOTE — Progress Notes (Signed)
Patient ambulated in the hall 250 ft.Tolerated well w/t walker.  Liliane Shi, RN

## 2013-01-17 NOTE — Progress Notes (Signed)
Looking good. This is a transformative procedure for this type patient. Great job by all.. Thanks.

## 2013-01-17 NOTE — Care Management Note (Signed)
    Page 1 of 1   01/22/2013     4:10:29 PM   CARE MANAGEMENT NOTE 01/22/2013  Patient:  Hunter Choi, Hunter Choi   Account Number:  [id number]  Date Initiated:  01/17/2013  Documentation initiated by:  Darlin Stenseth  Subjective/Objective Assessment:   PT S/P TAVR ON 01/15/13.  PTA, PT RESIDES AT HOME WITH SPOUSE.     Action/Plan:   AMBULATING WELL WITH WALKER.  WILL FOLLOW FOR DISCHARGE NEEDS AS PT PROGRESSES.   Anticipated DC Date:  01/19/2013   Anticipated DC Plan:  HOME W HOME HEALTH SERVICES      DC Planning Services  CM consult      Choice offered to / List presented to:             Status of service:  Completed, signed off Medicare Important Message given?   (If response is "NO", the following Medicare IM given date fields will be blank) Date Medicare IM given:   Date Additional Medicare IM given:    Discharge Disposition:  HOME/SELF CARE  Per UR Regulation:  Reviewed for med. necessity/level of care/duration of stay  If discussed at Long Length of Stay Meetings, dates discussed:   01/22/2013    Comments:  01/22/13 Rosalita Chessman [phone] PT DISCHARGING HOME TODAY WITH SPOUSE.  HAS PROGRESSED WELL.  HAS RW AT HOME.  NO DC NEEDS IDENTIFIED.

## 2013-01-17 NOTE — Progress Notes (Addendum)
      [address]       [name] [address]             [phone]        2 Days Post-Op Procedure(s) (LRB): TRANSCATHETER AORTIC VALVE REPLACEMENT, TRANSAPICAL (N/A) INTRAOPERATIVE TRANSESOPHAGEAL ECHOCARDIOGRAM (N/A)  Subjective: Patient states he feels pretty good this morning.  Objective: Vital signs in last 24 hours: Temp:  [97.4 F (36.3 C)-99 F (37.2 C)] 97.4 F (36.3 C) (10/23 0300) Pulse Rate:  [68-102] 102 (10/23 0300) Cardiac Rhythm:  [-] Normal sinus rhythm (10/22 1600) Resp:  [10-24] 20 (10/23 0300) BP: (94-172)/(48-87) 130/56 mmHg (10/23 0300) SpO2:  [92 %-98 %] 95 % (10/23 0300)  Pre op weight 83.7  kg Current Weight  01/16/13 87.1 kg (192 lb 0.3 oz)    Hemodynamic parameters for last 24 hours: PAP: (31-38)/(15-18) 31/16 mmHg  Intake/Output from previous day: 10/22 0701 - 10/23 0700 In: 1088 [P.O.:860; I.V.:78; IV Piggyback:150] Out: 2170 [Urine:2170]   Physical Exam:  Cardiovascular: RRR, no murmurs, gallops, or rubs. Pulmonary: Clear to auscultation bilaterally; no rales, wheezes, or rhonchi. Abdomen: Soft, non tender, bowel sounds present. Extremities: Mild bilateral lower extremity edema. Wounds: Dressings ar clean and dry.    Lab Results: CBC: Recent Labs  01/16/13 0420 01/16/13 1710 01/16/13 1712  WBC 11.2* 12.8*  --   HGB 11.3* 12.2* 13.6  HCT 33.8* 36.2* 40.0  PLT 145* 141*  --    BMET:  Recent Labs  01/15/13 1200  01/16/13 0420 01/16/13 1710 01/16/13 1712  NA 133*  < > 130*  --  132*  K 3.5  < > 3.5  --  3.4*  CL 98  < > 94*  --  94*  CO2 26  --  22  --   --   GLUCOSE 101*  < > 122*  --  129*  BUN 9  < > 8  --  6  CREATININE 0.71  < > 0.80 0.73 0.90  CALCIUM 8.0*  --  7.9*  --   --   < > = values in this interval not displayed.  PT/INR:  Lab Results  Component Value Date   INR 1.19 01/15/2013   INR 1.08 01/11/2013   ABG:  INR: Will add last result for INR, ABG once components are  confirmed Will add last 4 CBG results once components are confirmed  Assessment/Plan:  1. CV - PVCs/SR. On Norvasc 5 daily, Lopressor 25 bid, Plavix 75 daily, and Ecasa 81 daily. 2.  Pulmonary - Encourage incentive spirometer 3. Volume Overload - On Lasix 40 daily 4.  Acute blood loss anemia - Last H and H up to 12.2 and 36.2 5. HGA1C pre op 6. Likely pre diabetic. Will need follow up as an outpatient 6.Continue CRPI  ZIMMERMAN,DONIELLE MPA-C 01/17/2013,8:31 AM   Chart reviewed, patient examined, agree with above. He looks good Post op 2D echo shows no aortic stenosis or regurgitation Continue mobilization Probably home sat

## 2013-01-17 NOTE — Discharge Summary (Signed)
Physician Discharge Summary  Patient ID: Hunter Choi MRN: [id number] DOB/AGE: Jan 11, 1928 79 y.o.  Admit date: 01/15/2013 Discharge date: 01/22/2013  Admission Diagnoses:  Patient Active Problem List   Diagnosis Date Noted  . Severe aortic stenosis 01/01/2013  . Coronary artery disease   . CAD (coronary artery disease) of bypass graft   . Hypertension   . GERD (gastroesophageal reflux disease)   . Thyroid disease   . Aortic stenosis, severe   . Embolism involving retinal artery   . BPH (benign prostatic hypertrophy)   . Iliac artery aneurysm   . AAA (abdominal aortic aneurysm)   . Diastolic heart failure   . Rheumatoid arthritis(714.0) 04/22/2010  . DYSPNEA ON EXERTION 04/22/2010  . PULMONARY FUNCTION TESTS, ABNORMAL 04/22/2010  . S/P CABG x 4 01/26/1998   Discharge Diagnoses:   Patient Active Problem List   Diagnosis Date Noted  . 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 Severe aortic stenosis 01/01/2013  . Coronary artery disease   . CAD (coronary artery disease) of bypass graft   . Hypertension   . GERD (gastroesophageal reflux disease)   . Thyroid disease   . Aortic stenosis, severe   . Embolism involving retinal artery   . BPH (benign prostatic hypertrophy)   . Iliac artery aneurysm   . AAA (abdominal aortic aneurysm)   . Diastolic heart failure   . Rheumatoid arthritis(714.0) 04/22/2010  . DYSPNEA ON EXERTION 04/22/2010  . PULMONARY FUNCTION TESTS, ABNORMAL 04/22/2010  . S/P CABG x 4 01/26/1998   Discharged Condition: good  History of Present Illness:   Hunter Choi is an 79 year old gentleman with a history of severe rheumatoid arthritis on prednisone and methotrexate, poor mobility due to this and history of bilateral TKR and left hip fracture repair who underwent CABG x 4 by Dr. Tyrone Sage on 01/26/1998. He had a LIMA to the LAD and SVG's to the OM, D1 and RCA. He developed recurrent angina in 2004 and underwent cardiac  catheterization on 09/25/2002 which showed a chronically occluded saphenous vein graft to the right coronary with all the other grafts being patent. There was a 99% stenosis in the distal LAD which was subsequently treated on 09/27/2002 with a drug-eluting stent placed through the left internal mammary artery into the distal LAD. The patient has been routinely followed for aortic stenosis and was evaluated by Dr. Laneta Simmers on 04/20/2010 for progressive shortness of breath. His echocardiogram at that time showed an aortic valve area of 1 cm and his catheterization showed a peak to peak gradient across the aortic valve of 44 mm mercury with a mean gradient of only 25 mm mercury. His ejection fraction was 55%. The left internal mammary graft and saphenous vein grafts were patent with the exception of the chronically occluded saphenous vein graft to the right coronary artery. The native right coronary had significant diffuse disease within it. It was felt the patient was not an operative candidate at that time due to his severe debilitation. It was also felt that his moderate aortic stenosis was bad enough to warrant consideration of transcatheter aortic valve replacement. The patient now reports that over the last 6-12 months he has had progressive exertional dyspnea and fatigue that occurs with walking short distances in his house. He has had occasional episodes of chest discomfort. He has had orthopnea and has to sleep with 3 pillows. He denies dizziness and syncope. A recent echocardiogram shows progression of his aortic stenosis into the severe range  with an aortic valve area of 0.43 cm, a mean gradient of 40 mm mercury, and a peak gradient of 65 mm mercury. Left ventricular ejection fraction is 50-55%.  The patient was again evaluated by Dr. Laneta Simmers and his colleagues of the multidisciplinary heart valve clinic at which time preoperative test results of cardiac CT, CT of the chest, abdomen, and pelvis and cardiac  catheterization were reviewed.  It was felt the patient would be a candidate for a TAVR procedure via the trans apical approach.  The risks and benefits of the procedure were explained to the patient and he was agreeable to proceed.    Hospital Course:   The patient presented to [facility] on 01/15/2013.  He was taken to the operating room and underwent Transcatheter Aortic Valve Replacement via a trans apical approach.  This was done utilizing a 29 mm Edwards Sapien XT THV prosthesis.  He also underwent Anastomosis of an 8 mm Hemashield graft to the right axillary artery.  He tolerated the procedure well and was taken to the SICU in stable condition.  The patient was extubated the day of surgery. During his stay in the ICU the patient did well.  He was maintaining NSR, however he did have some occasional PVC/VT.  His chest tubes and arterial lines were removed without difficulty.  Post operative Echocardiogram was completed and showed the Aortic Valve was functioning properly.  The patient was medically stable and transferred to the telemetry unit in stable condition.  The patient is continues to progress.  He continues to maintain NSR and his pacing wires have been removed without difficulty.  The patient became febrile with temperature as high as 102. Further workup showed no evidence of Leukocytosis, UA was positive for Leukocytes, and CXR was suspicious for pneumonia.  He was started on empiric antibiotic coverage and urine culture was requested.  This has been negative since date.  The patient is no longer febrile.  He is ambulating with assistance and tolerating a cardiac diet.  Should no further issues arise we anticipate discharge home in the next 24-48 hours.  He will need to follow up with Dr. Laneta Simmers in TAVR clinic on 02/13/2013 with an Echocardiogram prior to his appointment.      Significant Diagnostic Studies:   Preoperative:   - Left ventricle: The cavity size was normal. Wall  thickness was increased in a pattern of moderate LVH. Systolic function was normal. The estimated ejection fraction was in the range of 50% to 55%. Doppler parameters are consistent with abnormal left ventricular relaxation (grade 1 diastolic dysfunction). - Aortic valve: Moderately calcified annulus. Severely thickened, severely calcified leaflets. Transvalvular velocity was increased, due to stenosis. There was critical stenosis. Valve area: 0.43cm^2(VTI). Valve area: 0.43cm^2 (Vmax). - Mitral valve: Calcified annulus. - Left atrium: The atrium was moderately dilated  Post Operative:   - Left ventricle: The cavity size was normal. Wall thickness was increased in a pattern of mild LVH. Systolic function was normal. The estimated ejection fraction was in the range of 55% to 60%. Wall motion was normal; there were no regional wall motion abnormalities.  - Aortic valve: Valve area: 1.47cm^2(VTI). Valve area: 1.72cm^2 (Vmax). Aortic valve: s/p TAVR. There is no aortic stenosis or AI Doppler: There was no stenosis. No regurgitation. VTI ratio of LVOT to aortic valve: 0.47. Valve area: 1.47cm^2(VTI). Indexed valve area: 0.72cm^2/m^2 (VTI). Peak velocity ratio of LVOT to aortic valve: 0.55. Valve area: 1.72cm^2 (Vmax). Indexed valve area: 0.84cm^2/m^2 (Vmax). Mean  gradient: 5mm Hg (S).  Treatments: surgery:   Transcatheter Aortic Valve Replacement - Transapical Approach Edwards Sapien XT THV (size 29 mm, model # 9300TFX, serial # [id number])  Anastomosis of 8 mm Hemashield graft to the right axillary artery  Disposition: 01-Home or Self Care  Discharge Medications:     Medication List    STOP taking these medications       mupirocin ointment 2 %  Commonly known as:  BACTROBAN     nitroGLYCERIN 0.4 MG SL tablet  Commonly known as:  NITROSTAT      TAKE these medications       acetaminophen 325 MG tablet  Commonly known as:  TYLENOL  Take 2 tablets (650 mg total) by  mouth every 6 (six) hours as needed for pain.     amLODipine 5 MG tablet  Commonly known as:  NORVASC  Take 5 mg by mouth daily.     aspirin 81 MG EC tablet  Take 1 tablet (81 mg total) by mouth daily.     clopidogrel 75 MG tablet  Commonly known as:  PLAVIX  Take 1 tablet (75 mg total) by mouth daily with breakfast.     diazepam 5 MG tablet  Commonly known as:  VALIUM  Take 5 mg by mouth daily as needed for anxiety.     folic acid 1 MG tablet  Commonly known as:  FOLVITE  Take 3 mg by mouth daily.     levofloxacin 750 MG tablet  Commonly known as:  LEVAQUIN  Take 1 tablet (750 mg total) by mouth every other day. For 5 Doses     levothyroxine 25 MCG tablet  Commonly known as:  SYNTHROID, LEVOTHROID  Take 25 mcg by mouth 4 (four) times a week. Monday, Wednesday,Friday, and Saturday     methotrexate 2.5 MG tablet  Take 25 mg by mouth every Saturday.     metoprolol tartrate 25 MG tablet  Commonly known as:  LOPRESSOR  Take 1 tablet (25 mg total) by mouth daily.     omeprazole 20 MG capsule  Commonly known as:  PRILOSEC  Take 20 mg by mouth daily.     simvastatin 20 MG tablet  Commonly known as:  ZOCOR  Take 20 mg by mouth every evening.     traMADol 50 MG tablet  Commonly known as:  ULTRAM  Take 1 tablet (50 mg total) by mouth every 6 (six) hours as needed for pain.         The patient has been discharged on:   1.Beta Blocker:  Yes [ x  ]                              No   [   ]                              If No, reason:  2.Ace Inhibitor/ARB: Yes [   ]                                     No  [  x  ]  If No, reason: labile hypotension  3.Statin:   Yes [ x  ]                  No  [   ]                  If No, reason:  4.Ecasa:  Yes  [ x  ]                  No   [   ]                  If No, reason:    Future Appointments Provider Department Dept Phone   02/08/2013 10:00 AM [facility] [phone]   02/13/2013 11:30 AM [name], MD [facility] [place] [phone]     Follow-up Information   Follow up with [name], MD On 02/13/2013. (Appointment is at 11:30 am)    Specialty:  Cardiothoracic Surgery   Contact information:   [address] [place] [address] [place] [address] [phone]       Signed: Ardelle Balls PA-C 01/21/2013, 11:18 AM

## 2013-01-18 NOTE — Progress Notes (Addendum)
    Subjective:  Feels good this am. Slept well last night. No shortness of breath.  Objective:  Vital Signs in the last 24 hours: Temp:  [98.1 F (36.7 C)-99.9 F (37.7 C)] 99.9 F (37.7 C) (10/24 0421) Pulse Rate:  [72-79] 79 (10/24 0421) Resp:  [16-18] 16 (10/24 0421) BP: (111-140)/(49-59) 115/58 mmHg (10/24 0421) SpO2:  [94 %-100 %] 94 % (10/24 0421) Weight:  [185 lb 1.6 oz (83.961 kg)] 185 lb 1.6 oz (83.961 kg) (10/24 0320)  Intake/Output from previous day: 10/23 0701 - 10/24 0700 In: 1203 [P.O.:1200; I.V.:3] Out: 556 [Urine:555; Stool:1]  Physical Exam: Pt is alert and oriented, pleasant elderly man in NAD HEENT: normal Neck: JVP - normal Lungs: CTA bilaterally CV: RRR without murmur or gallop Abd: soft, NT, Positive BS, no hepatomegaly Ext: trace pretibial edema, bilateral groin sites clear  Lab Results:  Recent Labs  01/16/13 0420 01/16/13 1710 01/16/13 1712  WBC 11.2* 12.8*  --   HGB 11.3* 12.2* 13.6  PLT 145* 141*  --     Recent Labs  01/15/13 1200  01/16/13 0420 01/16/13 1710 01/16/13 1712  NA 133*  < > 130*  --  132*  K 3.5  < > 3.5  --  3.4*  CL 98  < > 94*  --  94*  CO2 26  --  22  --   --   GLUCOSE 101*  < > 122*  --  129*  BUN 9  < > 8  --  6  CREATININE 0.71  < > 0.80 0.73 0.90  < > = values in this interval not displayed. No results found for this basename: TROPONINI, CK, MB,  in the last 72 hours  Tele: Personally reviewed. Sinus rhythm with single PVC's  Assessment/Plan:  1. Severe AS s/p transapical TAVR. 2. CAD s/p remote CABG without signs of active ischemia 3. Rheumatoid arthritis 4. Hyperlipidemia  Pt looks great and is progressing well. Cardiac rhythm is stable. He is on ASA and plavix after TAVR. Dispo per Dr Laneta Simmers but anticipate home next 1-2 days.  Tonny Bollman, M.D. 01/18/2013, 7:05 AM

## 2013-01-18 NOTE — Progress Notes (Addendum)
      [address]       [name] [address]             [phone]      3 Days Post-Op Procedure(s) (LRB): TRANSCATHETER AORTIC VALVE REPLACEMENT, TRANSAPICAL (N/A) INTRAOPERATIVE TRANSESOPHAGEAL ECHOCARDIOGRAM (N/A)  Subjective:  Hunter Choi states he had a good night, he slept without interuption until 5 AM.  He does states he did not walk as far this morning due to stiffness. + BM  Objective: Vital signs in last 24 hours: Temp:  [98.1 F (36.7 C)-99.9 F (37.7 C)] 99.9 F (37.7 C) (10/24 0421) Pulse Rate:  [59-79] 79 (10/24 0421) Cardiac Rhythm:  [-] Normal sinus rhythm;Heart block (10/23 2050) Resp:  [16-18] 16 (10/24 0421) BP: (111-140)/(49-59) 115/58 mmHg (10/24 0421) SpO2:  [94 %-100 %] 94 % (10/24 0421) Weight:  [185 lb 1.6 oz (83.961 kg)] 185 lb 1.6 oz (83.961 kg) (10/24 0320)  Intake/Output from previous day: 10/23 0701 - 10/24 0700 In: 1203 [P.O.:1200; I.V.:3] Out: 556 [Urine:555; Stool:1]  General appearance: alert, cooperative and no distress Heart: regular rate and rhythm Lungs: clear to auscultation bilaterally Abdomen: soft, non-tender; bowel sounds normal; no masses,  no organomegaly Extremities: edema trace Wound: clean and dry  Lab Results:  Recent Labs  01/16/13 0420 01/16/13 1710 01/16/13 1712  WBC 11.2* 12.8*  --   HGB 11.3* 12.2* 13.6  HCT 33.8* 36.2* 40.0  PLT 145* 141*  --    BMET:  Recent Labs  01/15/13 1200  01/16/13 0420 01/16/13 1710 01/16/13 1712  NA 133*  < > 130*  --  132*  K 3.5  < > 3.5  --  3.4*  CL 98  < > 94*  --  94*  CO2 26  --  22  --   --   GLUCOSE 101*  < > 122*  --  129*  BUN 9  < > 8  --  6  CREATININE 0.71  < > 0.80 0.73 0.90  CALCIUM 8.0*  --  7.9*  --   --   < > = values in this interval not displayed.  PT/INR:  Recent Labs  01/15/13 1400  LABPROT 14.8  INR 1.19   ABG    Component Value Date/Time   PHART 7.465* 01/15/2013 1844   HCO3 17.9* 01/15/2013 1844   TCO2 25  01/16/2013 1712   ACIDBASEDEF 5.0* 01/15/2013 1844   O2SAT 96.0 01/15/2013 1844   CBG (last 3)   Recent Labs  01/17/13 1621 01/17/13 2050 01/18/13 0557  GLUCAP 112* 113* 113*    Assessment/Plan: S/P Procedure(s) (LRB): TRANSCATHETER AORTIC VALVE REPLACEMENT, TRANSAPICAL (N/A) INTRAOPERATIVE TRANSESOPHAGEAL ECHOCARDIOGRAM (N/A)  1. CV- NSR- on Norvasc, Lopressor, Plavix 2. Pulm- no acute issues, continue IS 3. Renal- volume status is stable, weight is at baseline on Lasix 4. CBGs controlled- will d/c SSIP and fingersticks 5. Dispo- patient is doing well, will plan to d/c in AM pending no problems arise   LOS: 3 days    Hunter Choi 01/18/2013   Chart reviewed, patient examined, agree with above. He looks good. He had some trigeminy after walk but that is insignificant. He feels well. Plan home tomorrow if no changes.

## 2013-01-18 NOTE — Progress Notes (Signed)
Patient ambulated in hall 150 ft with walker from home. Towards the end of ambulation rhythm appeared to have frequent PVC's. Patient asymptomatic, will continue to monitor. 5:12 PM 01/18/2013 Liliane Shi, RN

## 2013-01-18 NOTE — Progress Notes (Signed)
CARDIAC REHAB PHASE I   PRE:  Rate/Rhythm: 88 SR  BP:  Supine:   Sitting: 114/60  Standing:    SaO2: 94 RA  MODE:  Ambulation: 180 ft   POST:  Rate/Rhythm: 96 SR with trigeminy of PVC's  BP:  Supine:   Sitting: 144/70  Standing:    SaO2: 94 RA Weak, but willing to ambulate with assistance x 1 and patient's own walker from home.  At the end of his walk his heart rhythm changed to trigeminy of PVC's.  Dr. Laneta Simmers aware per nurse, and is not concerned.  Continue to progress ambulation distance and stamina.   1330-1400  Cindra Eves RN, BSN 01/18/2013 1:53 PM

## 2013-01-18 NOTE — Progress Notes (Signed)
CARDIAC REHAB PHASE I   PRE:  Rate/Rhythm: 94 SR    BP: sitting 120/60    SaO2: 94 RA  MODE:  Ambulation: 150 ft   POST:  Rate/Rhythm: 106 ST    BP: sitting 136/60     SaO2: 95 RA  Pt eager to walk. Used RW, gait belt and assist x2. Pt leans over RW, encouraged to stand tall and close to RW, which he was able to do. Tolerated fairly well considering his baseline. To recliner.  [id number]   Elissa Lovett [place] CES, ACSM 01/18/2013 3:15 PM

## 2013-01-19 ENCOUNTER — Inpatient Hospital Stay (HOSPITAL_COMMUNITY): Payer: Medicare Other

## 2013-01-19 LAB — BASIC METABOLIC PANEL
BUN: 28 mg/dL — ABNORMAL HIGH (ref 6–23)
CO2: 26 mEq/L (ref 19–32)
Calcium: 8.3 mg/dL — ABNORMAL LOW (ref 8.4–10.5)
Chloride: 91 mEq/L — ABNORMAL LOW (ref 96–112)
GFR calc Af Amer: 60 mL/min — ABNORMAL LOW (ref >90)
Glucose, Bld: 110 mg/dL — ABNORMAL HIGH (ref 70–99)
Potassium: 3.5 mEq/L (ref 3.5–5.1)

## 2013-01-19 LAB — URINALYSIS, ROUTINE W REFLEX MICROSCOPIC
Hgb urine dipstick: NEGATIVE
Nitrite: NEGATIVE
Specific Gravity, Urine: 1.026 (ref 1.005–1.030)
Urobilinogen, UA: 1 mg/dL (ref 0.0–1.0)
pH: 5.5 (ref 5.0–8.0)

## 2013-01-19 LAB — CBC
HCT: 33.5 % — ABNORMAL LOW (ref 39.0–52.0)
Hemoglobin: 11 g/dL — ABNORMAL LOW (ref 13.0–17.0)
RBC: 3.56 MIL/uL — ABNORMAL LOW (ref 4.22–5.81)
WBC: 11.5 10*3/uL — ABNORMAL HIGH (ref 4.0–10.5)

## 2013-01-19 LAB — URINE MICROSCOPIC-ADD ON

## 2013-01-19 NOTE — Progress Notes (Signed)
Pt ambulated 150 feet with rolling walker; pt took 1 sitting rest break; pt to BR upon arrival back to room; will cont. To monitor.

## 2013-01-19 NOTE — Progress Notes (Addendum)
      [address]       [name] [address]             [phone]       4 Days Post-Op Procedure(s) (LRB): TRANSCATHETER AORTIC VALVE REPLACEMENT, TRANSAPICAL (N/A) INTRAOPERATIVE TRANSESOPHAGEAL ECHOCARDIOGRAM (N/A)  Subjective:  Hunter Choi has no complaints this morning.  He is hopeful to be discharged home today  Objective: Vital signs in last 24 hours: Temp:  [98.9 F (37.2 C)-100.8 F (38.2 C)] 100.8 F (38.2 C) (10/25 0418) Pulse Rate:  [72-78] 78 (10/24 1328) Cardiac Rhythm:  [-] Normal sinus rhythm;Heart block (10/25 0747) Resp:  [18] 18 (10/25 0418) BP: (117-121)/(30-54) 120/30 mmHg (10/25 0418) SpO2:  [95 %-96 %] 96 % (10/25 0418) Weight:  [186 lb 8.2 oz (84.6 kg)] 186 lb 8.2 oz (84.6 kg) (10/25 0418)  Intake/Output from previous day: 10/24 0701 - 10/25 0700 In: 243 [P.O.:240; I.V.:3] Out: -   General appearance: alert, cooperative and no distress Heart: regular rate and rhythm Lungs: diminished breath sounds bibasilar Abdomen: soft, non-tender; bowel sounds normal; no masses,  no organomegaly Extremities: edema trace Wound: clean and dry  Lab Results:  Recent Labs  01/16/13 1710 01/16/13 1712  WBC 12.8*  --   HGB 12.2* 13.6  HCT 36.2* 40.0  PLT 141*  --    BMET:  Recent Labs  01/16/13 1710 01/16/13 1712  NA  --  132*  K  --  3.4*  CL  --  94*  GLUCOSE  --  129*  BUN  --  6  CREATININE 0.73 0.90    PT/INR: No results found for this basename: LABPROT, INR,  in the last 72 hours ABG    Component Value Date/Time   PHART 7.465* 01/15/2013 1844   HCO3 17.9* 01/15/2013 1844   TCO2 25 01/16/2013 1712   ACIDBASEDEF 5.0* 01/15/2013 1844   O2SAT 96.0 01/15/2013 1844   CBG (last 3)   Recent Labs  01/17/13 1621 01/17/13 2050 01/18/13 0557  GLUCAP 112* 113* 113*    Assessment/Plan: S/P Procedure(s) (LRB): TRANSCATHETER AORTIC VALVE REPLACEMENT, TRANSAPICAL (N/A) INTRAOPERATIVE TRANSESOPHAGEAL ECHOCARDIOGRAM  (N/A)  1. CV- NSR on Norvasc, Lopressor and Plavix 2. Pulm- no acute issues, off oxygen, encouraged use of IS 3. ID- Febrile this morning at 100.8- will remove central line, get CBC, UA, and CXR- incisions are clean with no obvious source of infection 4. Renal- volume status stable, on Lasix 5. Dispo- patient was doing well, Febrile this morning will, should remain in hospital overnight to rule out infection, will discuss with staff   LOS: 4 days    Hunter Choi, Hunter Choi 01/19/2013  I have seen and examined the patient and agree with the assessment and plan as outlined.  Hold d/c for now and watch fever.  Recheck labs and CXR but I suspect atelectasis vs old central line as source.  Clinically he looks good.     Hunter Choi 01/19/2013 11:31 AM

## 2013-01-19 NOTE — Progress Notes (Signed)
Pt  Ambulated 150 ft around the circle using RW on room air tolerated well.

## 2013-01-19 NOTE — Progress Notes (Signed)
The patient is in good spirits. He is having frequent PACs this morning. No atrial fibrillation is identified. No aortic regurgitation is audible. Possibly being discharged today.

## 2013-01-19 NOTE — Progress Notes (Signed)
R IJ d/c at this time; vasoline dressing applied to site; pt bedrest until 1120; will cont. To monitor.

## 2013-01-19 NOTE — Progress Notes (Signed)
Pt ambulated 150 feet pushing wheelchair; pt took 1 sitting rest break; pt to bathroom upon back to room; will cont. To monitor.

## 2013-01-19 NOTE — Progress Notes (Signed)
CARDIAC REHAB PHASE I   PRE:  Rate/Rhythm: 81 SR BBB  BP:  Supine:   Sitting: 98/43  Standing:    SaO2: 97% RA  MODE:  Ambulation: 150 ft   POST:  Rate/Rhythm: 88  BP:  Supine:   Sitting: 126/40  Standing:    SaO2: 96% RA  1421-1504 Pt ambulated 150 feet with assist x2 and pushing rolling walker. Pt walks with slow, hunch over posture due to past hip surgery and rheumatoid arthritis. Assisted patient to bathroom after walk. Reviewed restrictions, IS use, heart healthy diet and modified activity guidelines (walking as tolerated) with patient's son.    Cristy Hilts, MS, ACSM CES

## 2013-01-20 DIAGNOSIS — M069 Rheumatoid arthritis, unspecified: Secondary | ICD-10-CM

## 2013-01-20 MED ORDER — LEVOFLOXACIN 750 MG PO TABS
750.0000 mg | ORAL_TABLET | ORAL | Status: DC
  Administered 2013-01-20 – 2013-01-22 (×2): 750 mg via ORAL
  Filled 2013-01-20 (×2): qty 1

## 2013-01-20 NOTE — Progress Notes (Signed)
Pt states his legs feel too weak to ambulate at this time; will cont. To monitor

## 2013-01-20 NOTE — Progress Notes (Signed)
Pt temp 101.7; pt given Tylenol at this time; pt not wanting to ambulate at this time; will cont. To monitor.

## 2013-01-20 NOTE — Progress Notes (Addendum)
      [address]       [name] [address]             [phone]      5 Days Post-Op Procedure(s) (LRB): TRANSCATHETER AORTIC VALVE REPLACEMENT, TRANSAPICAL (N/A) INTRAOPERATIVE TRANSESOPHAGEAL ECHOCARDIOGRAM (N/A)  Subjective:  Hunter Choi is without complaints this morning.  Remains hopeful to be discharged home soon.  The patient was febrile overnight with temperature as high as 102.  He remains febrile this morning, but temp is down to 99.  Objective: Vital signs in last 24 hours: Temp:  [98.2 F (36.8 C)-102.6 F (39.2 C)] 99.1 F (37.3 C) (10/26 0356) Pulse Rate:  [77-85] 77 (10/26 0356) Cardiac Rhythm:  [-] Normal sinus rhythm;Heart block (10/26 0806) Resp:  [16-18] 18 (10/26 0356) BP: (99-130)/(41-56) 108/55 mmHg (10/26 0356) SpO2:  [95 %-100 %] 95 % (10/26 0356) Weight:  [187 lb 4.8 oz (84.959 kg)] 187 lb 4.8 oz (84.959 kg) (10/26 0356)  Intake/Output from previous day: 10/25 0701 - 10/26 0700 In: 480 [P.O.:480] Out: 100 [Urine:100] Intake/Output this shift: Total I/O In: 240 [P.O.:240] Out: -   General appearance: alert, cooperative and no distress Heart: regular rate and rhythm Lungs: clear to auscultation bilaterally Abdomen: soft, non-tender; bowel sounds normal; no masses,  no organomegaly Extremities: edema trace Wound: clean and dry  Lab Results:  Recent Labs  01/19/13 1115  WBC 11.5*  HGB 11.0*  HCT 33.5*  PLT 105*   BMET:  Recent Labs  01/19/13 1115  NA 127*  K 3.5  CL 91*  CO2 26  GLUCOSE 110*  BUN 28*  CREATININE 1.25  CALCIUM 8.3*    PT/INR: No results found for this basename: LABPROT, INR,  in the last 72 hours ABG    Component Value Date/Time   PHART 7.465* 01/15/2013 1844   HCO3 17.9* 01/15/2013 1844   TCO2 25 01/16/2013 1712   ACIDBASEDEF 5.0* 01/15/2013 1844   O2SAT 96.0 01/15/2013 1844   CBG (last 3)   Recent Labs  01/17/13 1621 01/17/13 2050 01/18/13 0557  GLUCAP 112* 113* 113*     Assessment/Plan: S/P Procedure(s) (LRB): TRANSCATHETER AORTIC VALVE REPLACEMENT, TRANSAPICAL (N/A) INTRAOPERATIVE TRANSESOPHAGEAL ECHOCARDIOGRAM (N/A)  1. CV- NSR continue Norvasc, Lopressor, Plavix 2. Pulm- ? Pneumonia per CXR + atelectasis, no significant pleural effusion 3. ID- remained febrile overnight, no leukocytosis, UA + Leukocytes, CXR with ? Pneumonia- will obtain urine culture, will likely need ABX will discuss with staff 4. Renal- volume status is stable, weight is mildly elevated on Lasix 5. Dispo- patient doing well, however low grade fever present possible sources UTI vs. Pneumonia, will obtain Urine culture, discuss ABX regimen with staff   LOS: 5 days    BARRETT, ERIN 01/20/2013  I have seen and examined the patient and agree with the assessment and plan as outlined.  Will start empiric Avelox.  OWEN,CLARENCE H 01/20/2013 10:55 AM

## 2013-01-20 NOTE — Progress Notes (Signed)
Pt not wanting to ambulate at this time; will cont. To monitor. 

## 2013-01-20 NOTE — Progress Notes (Addendum)
Fever noted. Cardiac exam unchanged. Agree with cultures and consideration of early antibiotics given age and frailty.

## 2013-01-21 LAB — URINE CULTURE: Colony Count: NO GROWTH

## 2013-01-21 LAB — GLUCOSE, CAPILLARY

## 2013-01-21 MED ORDER — ACETAMINOPHEN 325 MG PO TABS: 650.0000 mg | ORAL_TABLET | Freq: Four times a day (QID) | ORAL | Status: DC | PRN

## 2013-01-21 MED ORDER — CLOPIDOGREL BISULFATE 75 MG PO TABS: 75.0000 mg | ORAL_TABLET | Freq: Every day | ORAL | Status: DC

## 2013-01-21 MED ORDER — METOPROLOL TARTRATE 25 MG PO TABS: 25.0000 mg | ORAL_TABLET | Freq: Every day | ORAL | Status: DC

## 2013-01-21 MED ORDER — ASPIRIN 81 MG PO TBEC: 81.0000 mg | DELAYED_RELEASE_TABLET | Freq: Every day | ORAL | Status: DC

## 2013-01-21 MED ORDER — TRAMADOL HCL 50 MG PO TABS: 50.0000 mg | ORAL_TABLET | Freq: Four times a day (QID) | ORAL | Status: DC | PRN

## 2013-01-21 NOTE — Progress Notes (Signed)
CARDIAC REHAB PHASE I   PRE:  Rate/Rhythm: 69 SR BBB  BP:  Supine:   Sitting: 115/50  Standing:    SaO2: 96 RA  MODE:  Ambulation: 150 ft   POST:  Rate/Rhythm: 92  BP:  Supine:   Sitting: 116/50  Standing:    SaO2: 100 RA [id number] Assisted X 1 used his walker and gait belt to ambulate. He has difficulty getting out of chair, it took two attempts to get up. Pt leans forward with walking and leaning on walker. He was able to walk 150 feet without c/o To recliner after walk then he ask to go to bathroom. Placed him in bathroom and instructed to call for help when he was finished.  Melina Copa RN 01/21/2013 9:31 AM

## 2013-01-21 NOTE — Progress Notes (Addendum)
      [address]       [name] [address]             [phone]        6 Days Post-Op Procedure(s) (LRB): TRANSCATHETER AORTIC VALVE REPLACEMENT, TRANSAPICAL (N/A) INTRAOPERATIVE TRANSESOPHAGEAL ECHOCARDIOGRAM (N/A)  Subjective: Patient eating breakfast. He states he feels fairly well.  Objective: Vital signs in last 24 hours: Temp:  [98.2 F (36.8 C)-101.7 F (38.7 C)] 99.2 F (37.3 C) (10/27 0614) Pulse Rate:  [75-82] 82 (10/27 0354) Cardiac Rhythm:  [-] Normal sinus rhythm;Heart block (10/27 0354) Resp:  [16-20] 20 (10/27 0354) BP: (108-127)/(50-71) 124/58 mmHg (10/27 0354) SpO2:  [94 %-99 %] 99 % (10/27 0354) Weight:  [80.831 kg (178 lb 3.2 oz)] 80.831 kg (178 lb 3.2 oz) (10/27 0354)  Pre op weight 83.7  kg Current Weight  01/21/13 80.831 kg (178 lb 3.2 oz)      Intake/Output from previous day: 10/26 0701 - 10/27 0700 In: 720 [P.O.:720] Out: 551 [Urine:550; Stool:1]   Physical Exam:  Cardiovascular: RRR, no murmurs, gallops, or rubs. Pulmonary: Slightly diminished at left base, right lung clear; no rales, wheezes, or rhonchi. Abdomen: Soft, non tender, bowel sounds present. Extremities: Trace bilateral lower extremity edema. Wounds: Clean and dry.    Lab Results: CBC:  Recent Labs  01/19/13 1115  WBC 11.5*  HGB 11.0*  HCT 33.5*  PLT 105*   BMET:   Recent Labs  01/19/13 1115  NA 127*  K 3.5  CL 91*  CO2 26  GLUCOSE 110*  BUN 28*  CREATININE 1.25  CALCIUM 8.3*    PT/INR:  Lab Results  Component Value Date   INR 1.19 01/15/2013   INR 1.08 01/11/2013   ABG:  INR: Will add last result for INR, ABG once components are confirmed Will add last 4 CBG results once components are confirmed  Assessment/Plan:  1. CV - PVCs/SR. On Norvasc 5 daily, Lopressor 25 bid, Plavix 75 daily, and Ecasa 81 daily. 2.  Pulmonary - Encourage incentive spirometer 3.  Acute blood loss anemia - Last H and H up to 12.2 and  36.2 4.Fever to 101.7 over weekend and started on Levaquin yesterday. UA showed trace leukocytes and UC is pending. CXR done on Saturday showed either subsegmental atelectasis or possible PNA at left base. 5.Continue CRPI  ZIMMERMAN,DONIELLE MPA-C 01/21/2013,7:48 AM    Chart reviewed, patient examined, agree with above. He had temp to 101.7 yesterday afternoon 2 pm. He has been 98-99 since. I can't find any definite source except that he had a central line in until Saturday. His saline well is new. UA unremarkable and CXR looked fine. Incisions fine. Will repeat WBC ct in am and plan to send home unless he has further fever.

## 2013-01-21 NOTE — Progress Notes (Signed)
Pt ambulated approx. 150 feet with rolling walker. Pt states he "wants to get back to his food" and will walk more later. Pt tolerated well.

## 2013-01-22 LAB — GLUCOSE, CAPILLARY: Glucose-Capillary: 110 mg/dL — ABNORMAL HIGH (ref 70–99)

## 2013-01-22 LAB — CBC
HCT: 31.3 % — ABNORMAL LOW (ref 39.0–52.0)
Hemoglobin: 10.3 g/dL — ABNORMAL LOW (ref 13.0–17.0)
MCHC: 32.9 g/dL (ref 30.0–36.0)
Platelets: 133 10*3/uL — ABNORMAL LOW (ref 150–400)
RBC: 3.36 MIL/uL — ABNORMAL LOW (ref 4.22–5.81)
WBC: 7.8 10*3/uL (ref 4.0–10.5)

## 2013-01-22 MED ORDER — LEVOFLOXACIN 750 MG PO TABS: 750.0000 mg | ORAL_TABLET | ORAL | Status: DC

## 2013-01-22 NOTE — Progress Notes (Signed)
      [address]       [name] [address]             [phone]      7 Days Post-Op Procedure(s) (LRB): TRANSCATHETER AORTIC VALVE REPLACEMENT, TRANSAPICAL (N/A) INTRAOPERATIVE TRANSESOPHAGEAL ECHOCARDIOGRAM (N/A)  Subjective:  Mr. Teare has no complaints this morning. Anxious to get home.  Objective: Vital signs in last 24 hours: Temp:  [98 F (36.7 C)-100 F (37.8 C)] 98 F (36.7 C) (10/28 0450) Pulse Rate:  [69-80] 69 (10/28 0450) Cardiac Rhythm:  [-] Normal sinus rhythm;Heart block (10/27 1950) Resp:  [18] 18 (10/28 0450) BP: (117-161)/(42-70) 124/44 mmHg (10/28 0450) SpO2:  [97 %-100 %] 97 % (10/28 0450) Weight:  [185 lb 3 oz (84 kg)] 185 lb 3 oz (84 kg) (10/28 0338)  Intake/Output from previous day: 10/27 0701 - 10/28 0700 In: 720 [P.O.:720] Out: -   General appearance: alert, cooperative and no distress Heart: regular rate and rhythm Lungs: clear to auscultation bilaterally Abdomen: soft, non-tender; bowel sounds normal; no masses,  no organomegaly Extremities: edema trace Wound: clean and dry  Lab Results:  Recent Labs  01/19/13 1115 01/22/13 0341  WBC 11.5* 7.8  HGB 11.0* 10.3*  HCT 33.5* 31.3*  PLT 105* 133*   BMET:  Recent Labs  01/19/13 1115  NA 127*  K 3.5  CL 91*  CO2 26  GLUCOSE 110*  BUN 28*  CREATININE 1.25  CALCIUM 8.3*    PT/INR: No results found for this basename: LABPROT, INR,  in the last 72 hours ABG    Component Value Date/Time   PHART 7.465* 01/15/2013 1844   HCO3 17.9* 01/15/2013 1844   TCO2 25 01/16/2013 1712   ACIDBASEDEF 5.0* 01/15/2013 1844   O2SAT 96.0 01/15/2013 1844   CBG (last 3)   Recent Labs  01/21/13 2107 01/22/13 0639  GLUCAP 97 110*    Assessment/Plan: S/P Procedure(s) (LRB): TRANSCATHETER AORTIC VALVE REPLACEMENT, TRANSAPICAL (N/A) INTRAOPERATIVE TRANSESOPHAGEAL ECHOCARDIOGRAM (N/A)  1. CV- NSR, occasional PVCs- continue Norvasc, Lopressor, Plavix 2. Pulm- no acute  issues, encouraged use of IS at discharge 3. ID- fever resolved, no leukocytosis, Urine Culture has been negative, incisions are clean and dry- will complete course of Levaquin 4. Dispo- patient is medically stable, no acute source of infection, will d/c home today   LOS: 7 days    Raford Pitcher, Jordanna Hendrie 01/22/2013

## 2013-01-22 NOTE — Progress Notes (Signed)
CARDIAC REHAB PHASE I   PRE:  Rate/Rhythm: 74 SR BBB  BP:  Supine:   Sitting: 108/79  Standing:    SaO2: 93-94%RA  MODE:  Ambulation: 150 ft   POST:  Rate/Rhythm: 87SRBBB  BP:  Supine:   Sitting:   Standing:    SaO2:  To bathroom 0938-1000 Pt walked 150 ft on RA with his rolling walker and asst x 1. Used gait belt. Tired by end of walk. To bathroom after walk. Notified pt's RN. Knows to use call bell when finished.    Luetta Nutting, RN BSN  01/22/2013 9:57 AM

## 2013-01-22 NOTE — Progress Notes (Signed)
Discharge education and prescriptions given to pt and pt's son-in law. Pt and family verbalized their understanding of the information. Pt is stable for discharge with son in law.

## 2013-01-22 NOTE — Progress Notes (Signed)
Ambulated 150 ft around the circle using rolling walker on room air tolerated well.

## 2013-01-22 NOTE — Progress Notes (Signed)
Removed pt's chest tube sutures per order. Pt tolerated well. Placed steri strips and benzoin over site. 

## 2013-02-08 ENCOUNTER — Ambulatory Visit (HOSPITAL_COMMUNITY)
Admit: 2013-02-08 | Discharge: 2013-02-08 | Disposition: A | Discharge status: Home / Self Care | Payer: Medicare Other | Attending: Cardiovascular Disease | Admitting: Cardiovascular Disease

## 2013-02-08 DIAGNOSIS — I10 Essential (primary) hypertension: Secondary | ICD-10-CM | POA: Insufficient documentation

## 2013-02-08 DIAGNOSIS — I5030 Unspecified diastolic (congestive) heart failure: Secondary | ICD-10-CM | POA: Insufficient documentation

## 2013-02-08 DIAGNOSIS — I359 Nonrheumatic aortic valve disorder, unspecified: Secondary | ICD-10-CM

## 2013-02-08 DIAGNOSIS — I059 Rheumatic mitral valve disease, unspecified: Secondary | ICD-10-CM

## 2013-02-08 DIAGNOSIS — Z951 Presence of aortocoronary bypass graft: Secondary | ICD-10-CM | POA: Insufficient documentation

## 2013-02-08 DIAGNOSIS — M069 Rheumatoid arthritis, unspecified: Secondary | ICD-10-CM | POA: Insufficient documentation

## 2013-02-08 DIAGNOSIS — Z09 Encounter for follow-up examination after completed treatment for conditions other than malignant neoplasm: Secondary | ICD-10-CM | POA: Insufficient documentation

## 2013-02-08 DIAGNOSIS — K219 Gastro-esophageal reflux disease without esophagitis: Secondary | ICD-10-CM | POA: Insufficient documentation

## 2013-02-08 DIAGNOSIS — Z87891 Personal history of nicotine dependence: Secondary | ICD-10-CM | POA: Insufficient documentation

## 2013-02-08 DIAGNOSIS — Z952 Presence of prosthetic heart valve: Secondary | ICD-10-CM | POA: Insufficient documentation

## 2013-02-08 NOTE — Progress Notes (Signed)
*  PRELIMINARY RESULTS* Echocardiogram 2D Echocardiogram has been performed.  Jeryl Columbia 02/08/2013, 11:20 AM

## 2013-02-11 ENCOUNTER — Other Ambulatory Visit: Payer: Self-pay | Admitting: *Deleted

## 2013-02-13 ENCOUNTER — Ambulatory Visit (INDEPENDENT_AMBULATORY_CARE_PROVIDER_SITE_OTHER): Payer: Medicare Other | Admitting: Surgery

## 2013-02-13 ENCOUNTER — Ambulatory Visit
Admission: RE | Admit: 2013-02-13 | Discharge: 2013-02-13 | Disposition: A | Discharge status: Home / Self Care | Payer: Medicare Other | Source: Ambulatory Visit | Attending: Surgery | Admitting: Surgery

## 2013-02-13 ENCOUNTER — Other Ambulatory Visit: Payer: Self-pay | Admitting: *Deleted

## 2013-02-13 ENCOUNTER — Encounter (INDEPENDENT_AMBULATORY_CARE_PROVIDER_SITE_OTHER): Payer: Self-pay

## 2013-02-13 VITALS — BP 156/72 | HR 60 | Resp 16 | Ht 60.25 in | Wt 188.5 lb

## 2013-02-13 DIAGNOSIS — I359 Nonrheumatic aortic valve disorder, unspecified: Secondary | ICD-10-CM

## 2013-02-13 DIAGNOSIS — I350 Nonrheumatic aortic (valve) stenosis: Secondary | ICD-10-CM

## 2013-02-13 DIAGNOSIS — Z954 Presence of other heart-valve replacement: Secondary | ICD-10-CM

## 2013-02-13 DIAGNOSIS — Z952 Presence of prosthetic heart valve: Secondary | ICD-10-CM

## 2013-02-14 ENCOUNTER — Encounter: Payer: Self-pay | Admitting: Surgery

## 2013-02-14 NOTE — Progress Notes (Signed)
HPI:   Mr. Hunter Choi returns today for follow up S/P transapical TAVR on 01/15/2013. He has been feeling much better and is walking with a walker with no shortness of breath. His strength is improving.   Current Outpatient Prescriptions  Medication Sig Dispense Refill  . acetaminophen (TYLENOL) 325 MG tablet Take 2 tablets (650 mg total) by mouth every 6 (six) hours as needed for pain.      Marland Kitchen amLODipine (NORVASC) 5 MG tablet Take 5 mg by mouth daily.      Marland Kitchen aspirin EC 81 MG EC tablet Take 1 tablet (81 mg total) by mouth daily.      . clopidogrel (PLAVIX) 75 MG tablet Take 1 tablet (75 mg total) by mouth daily with breakfast.  30 tablet  1  . diazepam (VALIUM) 5 MG tablet Take 5 mg by mouth daily as needed for anxiety.      . folic acid (FOLVITE) 1 MG tablet Take 3 mg by mouth daily.      Marland Kitchen levothyroxine (SYNTHROID, LEVOTHROID) 25 MCG tablet Take 25 mcg by mouth 4 (four) times a week. Monday, Wednesday,Friday, and Saturday      . methotrexate 2.5 MG tablet Take 25 mg by mouth every Saturday.       . metoprolol tartrate (LOPRESSOR) 25 MG tablet Take 1 tablet (25 mg total) by mouth daily.  60 tablet  1  . omeprazole (PRILOSEC) 20 MG capsule Take 20 mg by mouth daily.      . simvastatin (ZOCOR) 20 MG tablet Take 20 mg by mouth every evening.      . traMADol (ULTRAM) 50 MG tablet Take 1 tablet (50 mg total) by mouth every 6 (six) hours as needed for pain.  30 tablet  0   No current facility-administered medications for this visit.     Physical Exam: BP 156/72  Pulse 60  Resp 16  Ht 5' 0.25" (1.53 m)  Wt 188 lb 8 oz (85.503 kg)  BMI 36.53 kg/m2  SpO2 95% He looks better than he did preop.  Lungs are clear Cardiac exam shows a regular rate and rhythm with normal heart sounds and no murmur. The left chest incision is healing well  Diagnostic Tests:  CLINICAL DATA: Aortic valve replacement.  EXAM:  CHEST 2 VIEW  COMPARISON: 01/19/2013.  FINDINGS:  Aortic valve prosthesis  unchanged in position and orientation.  Negative for heart failure or effusion.  Bibasilar airspace disease, with some progression on the right since  the prior study. This was not present on the study of 12/17/2012 and  may represent pneumonia or atelectasis.  IMPRESSION:  Aortic valve prosthesis unchanged in position and orientation.  Bibasilar airspace disease, with progression on the right. Question  pneumonia versus atelectasis.  Electronically Signed  By: Marlan Palau M.D.  On: 02/13/2013 10:57      *[place]* *[facility]* [address] [place], [place] [address] [phone]  ------------------------------------------------------------ Transthoracic Echocardiography  Patient: Hunter Choi, Hunter Choi Study Date: 02/08/2013 Gender: M Age: 79 Height: 167.6cm Weight: 83.6kg BSA: 19m^2 Pt. Status: Room:  PERFORMING [facility], Echo ATTENDING Oletha Blend SONOGRAPHER Jeryl Columbia cc:  ------------------------------------------------------------ LV EF: 50% - 55%  ------------------------------------------------------------ Indications: V42.2.  ------------------------------------------------------------ History: PMH: Rheumatoid Arthritis, TAVR 01/22/13, GERD, CABG, Diastolic Heart Failure, AAA, Former Smoker, HTN Aortic Valve Disorder  ------------------------------------------------------------ Study Conclusions  - Left ventricle: Small region of apical akinesis and bright target in apex. No obvious  thrombus. Wall thickness was increased in a pattern of severe LVH. Systolic function was normal. The estimated ejection fraction was in the range of 50% to 55%. There is akinesis of the apical myocardium. - Aortic valve: A bioprosthesis was present and functioning normally. The sewing ring had no rocking motion. Trivial regurgitation. - Mitral valve: Calcified annulus. Mild regurgitation.  Valve area by pressure half-time: 2.04cm^2. - Left atrium: The atrium was moderately dilated. - Right ventricle: The cavity size was mildly dilated. Transthoracic echocardiography. M-mode, complete 2D, spectral Doppler, and color Doppler. Height: Height: 167.6cm. Height: 66in. Weight: Weight: 83.6kg. Weight: 184lb. Body mass index: BMI: 29.8kg/m^2. Body surface area: BSA: 31m^2. Blood pressure: 134/56. Patient status: Outpatient. Location: Echo laboratory.  ------------------------------------------------------------  ------------------------------------------------------------ Left ventricle: Small region of apical akinesis and bright target in apex. No obvious thrombus. Wall thickness was increased in a pattern of severe LVH. Systolic function was normal. The estimated ejection fraction was in the range of 50% to 55%. Regional wall motion abnormalities: There is akinesis of the apical myocardium.  ------------------------------------------------------------ Aortic valve: A bioprosthesis was present and functioning normally. The sewing ring had no rocking motion. Doppler: Trivial regurgitation. VTI ratio of LVOT to aortic valve: 0.49. Peak velocity ratio of LVOT to aortic valve: 0.46. Mean gradient: 8mm Hg (S). Peak gradient: 16mm Hg (S).  ------------------------------------------------------------ Aorta: The aorta was normal, not dilated, and non-diseased.  ------------------------------------------------------------ Mitral valve: Calcified annulus. Doppler: Mild regurgitation. Valve area by pressure half-time: 2.04cm^2. Indexed valve area by pressure half-time: 1.02cm^2/m^2.  ------------------------------------------------------------ Left atrium: The atrium was moderately dilated.  ------------------------------------------------------------ Atrial septum: Poorly visualized.  ------------------------------------------------------------ Right ventricle: The cavity size  was mildly dilated. Systolic function was normal.  ------------------------------------------------------------ Pulmonic valve: The valve appears to be grossly normal. Doppler: Mild regurgitation.  ------------------------------------------------------------ Tricuspid valve: Doppler: Trivial regurgitation.  ------------------------------------------------------------ Pulmonary artery: Poorly visualized.  ------------------------------------------------------------ Right atrium: The atrium was normal in size.  ------------------------------------------------------------ Pericardium: There was no pericardial effusion.  ------------------------------------------------------------ Systemic veins: Inferior vena cava: The vessel was normal in size; the respirophasic diameter changes were in the normal range (= 50%); findings are consistent with normal central venous pressure.  ------------------------------------------------------------ Post procedure conclusions Ascending Aorta:  - The aorta was normal, not dilated, and non-diseased.  ------------------------------------------------------------  2D measurements Normal Doppler measurements Normal Left ventricle Left ventricle LVID ED, 44.8 mm 43-52 Ea, lat 5.3 cm/s ------ chord, ann, tiss PLAX DP LVID ES, 37.1 mm 23-38 E/Ea, lat 11.1 ------ chord, ann, tiss 7 PLAX DP FS, chord, 17 % >29 Ea, med 10.8 cm/s ------ PLAX ann, tiss LVPW, ED 19.6 mm ------ DP IVS/LVPW 0.86 <1.3 E/Ea, med 5.48 ------ ratio, ED ann, tiss Ventricular septum DP IVS, ED 16.8 mm ------ LVOT Aorta Peak vel, 93.4 cm/s ------ Root diam, 30 mm ------ S ED VTI, S 20 cm ------ Left atrium Aortic valve AP dim 47 mm ------ Peak vel, 203 cm/s ------ AP dim 2.35 cm/m^2 <2.2 S index Mean vel, 129 cm/s ------ S VTI, S 40.9 cm ------ Mean 8 mm Hg ------ gradient, S Peak 16 mm Hg ------ gradient, S VTI ratio 0.49 ------ LVOT/AV Peak vel 0.46  ------ ratio, LVOT/AV Mitral valve Peak E vel 59.2 cm/s ------ Peak A vel 78.5 cm/s ------ Decelerati 204 ms 150-23 on time 0 Pressure 108 ms ------ half-time Peak E/A 0.8 ------ ratio Area (PHT) 2.04 cm^2 ------ Area index 1.02 cm^2/m ------ (PHT) ^2  ------------------------------------------------------------ Prepared and Electronically Authenticated by  Lyn Records. 2014-11-14T18:[id number]  Impression:  He is doing well following TAVR. His severe dyspnea with exertion that was present preop is gone and his main limitation at this time is his severe rheumatoid arthritis. He is getting stronger and I encouraged him to continue walking as much as possible.  Plan:  He will return to valve clinic in 1 year with a 2 D echo. He will be followed by Dr. Verdis Prime for his cardiology care.

## 2013-02-27 ENCOUNTER — Ambulatory Visit (INDEPENDENT_AMBULATORY_CARE_PROVIDER_SITE_OTHER): Payer: Medicare Other | Admitting: Podiatry

## 2013-02-27 ENCOUNTER — Encounter: Payer: Self-pay | Admitting: Podiatry

## 2013-02-27 VITALS — BP 151/78 | HR 66 | Resp 16 | Ht 65.0 in | Wt 183.0 lb

## 2013-02-27 DIAGNOSIS — M79609 Pain in unspecified limb: Secondary | ICD-10-CM

## 2013-02-27 DIAGNOSIS — B351 Tinea unguium: Secondary | ICD-10-CM

## 2013-02-27 NOTE — Progress Notes (Signed)
   Subjective:    Patient ID: Hunter Choi, male    DOB: 1927-09-11, 79 y.o.   MRN: [id number]  HPI Comments: N thick painful toenails  L bilateral feet 1-5  D ongoing  O gradual  C worse A shoes and socks  T can no longer trim self , he has severe rheumatoid arthritis      Review of Systems     Objective:   Physical Exam: I have reviewed his past medical history medications allergies review of systems. Vital signs are stable he is alert and oriented. Pulses remain palpable bilateral lower trimmed the. Capillary fill time to digits one through 5 is immediate bilaterally. Cutaneous evaluation demonstrates supple while hydrated cutis nails are thick yellow dystrophic clinically mycotic and painful on palpation. Orthopedic evaluation demonstrates severe hammertoe and digital deformities bilaterally associated with rheumatoid arthritis and previous surgical attempts to repair the toes.        Assessment & Plan:  Assessment: Pain in limb secondary to onychomycosis and digital deformity.  Plan: Debridement of nails 1 through 5 bilateral covered service secondary to pain followup with him in 3 months.

## 2013-03-05 ENCOUNTER — Encounter: Payer: Self-pay | Admitting: Vascular Surgery

## 2013-03-06 ENCOUNTER — Ambulatory Visit (INDEPENDENT_AMBULATORY_CARE_PROVIDER_SITE_OTHER): Payer: Medicare Other | Admitting: Vascular Surgery

## 2013-03-06 ENCOUNTER — Encounter: Payer: Self-pay | Admitting: Vascular Surgery

## 2013-03-06 VITALS — BP 148/53 | HR 66 | Ht 65.0 in | Wt 187.0 lb

## 2013-03-06 DIAGNOSIS — I6529 Occlusion and stenosis of unspecified carotid artery: Secondary | ICD-10-CM

## 2013-03-06 DIAGNOSIS — I714 Abdominal aortic aneurysm, without rupture, unspecified: Secondary | ICD-10-CM

## 2013-03-06 NOTE — Progress Notes (Signed)
[facility] [place]  Patient name: Hunter Choi MRN: [id number] DOB: 1928-01-23 Sex: male  REASON FOR CONSULT: follow up of abdominal aortic aneurysm and bilateral common iliac artery aneurysms.  HPI: Hunter Choi is a 79 y.o. male who I had been following with an abdominal aortic aneurysm and bilateral common iliac artery aneurysms. I last saw him in November of 2010. His aneurysms have been stable in size over several years and we were continuing his follow up at yearly intervals. However he was then lost to follow up.  This patient presented with severe aortic stenosis. He underwent transcatheter aortic valve replacement using a trans-apical approach by Dr. Laneta Simmers and Dr. Excell Seltzer. His CT scan in October prior to his procedure showed that his abdominal aorta are measured 4.7 cm at the level of the bifurcation. The right common iliac artery measured 4.6 cm in maximum diameter and the left common iliac artery 3.8 centimeters in maximum diameter. Back in 2010, the distal aorta measured 3.7 cm in maximum diameter. The right common iliac artery measured 3.7 cm in maximum diameter. The left common iliac artery measured 3.5 cm in maximum diameter.  The patient has done amazingly well since his aortic valve replacement. He denies any abdominal pain or back pain. He shortness of breath improved significantly after surgery. He has no specific complaints except that he is anxious to see his wife who was involved in a car accident and is hospitalized in [place].  Past Medical History  Diagnosis Date  . Hypertension   . GERD (gastroesophageal reflux disease)   . Thyroid disease     HYPOTHYROIDISM  . Aortic stenosis, severe   . Embolism involving retinal artery   . BPH (benign prostatic hypertrophy)     Dr. Retta Diones  . Iliac artery aneurysm     Dr. Edilia Bo  . AAA (abdominal aortic aneurysm)     Dr. Ashley Royalty  . Diastolic heart failure   . S/P CABG x 4 01/26/1998   LIMA to LAD, SVG to D1, SVG to LCx, SVG to RCA, open saphenous vein harvest via right lower extremity  . Coronary artery disease     Left main disease and severe 3-vessel CAD  . CAD (coronary artery disease) of bypass graft     Chronic occlusion of saphenous vein graft to RCA  . Hypothyroidism   . Shortness of breath   . Heart murmur   . Stroke     mini stroke effective his eye left  . Arthritis   . S/P TAVR (transcatheter aortic valve replacement) 01/15/2013    29 mm Edwards Sapien XT transcatheter heart valve placed via transapical approach   Family History  Problem Relation Age of Onset  . Heart attack Mother   . Hypertension Son    SOCIAL HISTORY: History  Substance Use Topics  . Smoking status: Former Smoker -- 0.50 packs/day for 47 years    Types: Cigarettes    Quit date: 03/28/1958  . Smokeless tobacco: Former Neurosurgeon    Types: Chew     Comment: ONLY USED 2 YEARS  . Alcohol Use: No   Allergies  Allergen Reactions  . Ciprofloxacin     REACTION: mild delirium   Current Outpatient Prescriptions  Medication Sig Dispense Refill  . acetaminophen (TYLENOL) 325 MG tablet Take 2 tablets (650 mg total) by mouth every 6 (six) hours as needed for pain.      Marland Kitchen amLODipine (NORVASC) 5 MG tablet Take 5  mg by mouth daily.      Marland Kitchen aspirin EC 81 MG EC tablet Take 1 tablet (81 mg total) by mouth daily.      . clopidogrel (PLAVIX) 75 MG tablet Take 1 tablet (75 mg total) by mouth daily with breakfast.  30 tablet  1  . diazepam (VALIUM) 5 MG tablet Take 5 mg by mouth daily as needed for anxiety.      . folic acid (FOLVITE) 1 MG tablet Take 3 mg by mouth daily.      Marland Kitchen levothyroxine (SYNTHROID, LEVOTHROID) 25 MCG tablet Take 25 mcg by mouth 4 (four) times a week. Monday, Wednesday,Friday, and Saturday      . methotrexate 2.5 MG tablet Take 25 mg by mouth every Saturday.       . metoprolol tartrate (LOPRESSOR) 25 MG tablet Take 1 tablet (25 mg total) by mouth daily.  60 tablet  1  .  omeprazole (PRILOSEC) 20 MG capsule Take 20 mg by mouth daily.      . predniSONE (DELTASONE) 5 MG tablet Take 5 mg by mouth daily with breakfast.      . simvastatin (ZOCOR) 20 MG tablet Take 20 mg by mouth every evening.      . traMADol (ULTRAM) 50 MG tablet Take 1 tablet (50 mg total) by mouth every 6 (six) hours as needed for pain.  30 tablet  0   No current facility-administered medications for this visit.   REVIEW OF SYSTEMS: Arly.Keller ] denotes positive finding; [  ] denotes negative finding  CARDIOVASCULAR:  [ ]  chest pain   [ ]  chest pressure   [ ]  palpitations   [ ]  orthopnea   [ ]  dyspnea on exertion   [ ]  claudication   [ ]  rest pain   [ ]  DVT   [ ]  phlebitis PULMONARY:   [ ]  productive cough   [ ]  asthma   [ ]  wheezing NEUROLOGIC:   [ ]  weakness  [ ]  paresthesias  [ ]  aphasia  [ ]  amaurosis  [ ]  dizziness HEMATOLOGIC:   [ ]  bleeding problems   [ ]  clotting disorders MUSCULOSKELETAL:  [ ]  joint pain   [ ]  joint swelling [ ]  leg swelling GASTROINTESTINAL: [ ]   blood in stool  [ ]   hematemesis GENITOURINARY:  [ ]   dysuria  [ ]   hematuria PSYCHIATRIC:  [ ]  history of major depression INTEGUMENTARY:  [ ]  rashes  [ ]  ulcers CONSTITUTIONAL:  [ ]  fever   [ ]  chills  PHYSICAL EXAM: Filed Vitals:   03/06/13 1153  BP: 148/53  Pulse: 66  Height: 5\' 5"  (1.651 m)  Weight: 187 lb (84.823 kg)  SpO2: 100%   Body mass index is 31.12 kg/(m^2). GENERAL: The patient is a well-nourished male, in no acute distress. The vital signs are documented above. CARDIOVASCULAR: There is a regular rate and rhythm. I did not detect carotid bruits. He has palpable femoral pulses. Both feet are warm and well-perfused. He has mild bilateral lower extremity swelling. PULMONARY: There is good air exchange bilaterally without wheezing or rales. ABDOMEN: Soft and non-tender with normal pitched bowel sounds. His aneurysms are palpable and nontender. MUSCULOSKELETAL: There are no major deformities or  cyanosis. NEUROLOGIC: No focal weakness or paresthesias are detected. SKIN: There are no ulcers or rashes noted. PSYCHIATRIC: The patient has a normal affect.  DATA:  I have reviewed his previous CT scan from October. The distal aortic aneurysm has increased 1 cm over  the last 4 years. Next in diameter now is 4.7 cm. The right common iliac artery aneurysm has increased 1 cm over 4 years. Its maximum diameter is 4.7 cm. The left common iliac artery aneurysm has increased 0.3 cm in 4 years. Its maximum diameter is 3.8 cm. He has hypogastric artery aneurysms bilaterally. Based on his anatomy, we would have to exclude both hypogastric artery aneurysms in order to perform EVAR.   MEDICAL ISSUES:  AAA (abdominal aortic aneurysm) without rupture This patient has a 4.7 cm distal infrarenal abdominal aortic aneurysm, a 4.7 cm right common iliac artery aneurysm, and a 3.8 cm left common iliac artery aneurysm, and bilateral hypogastric artery aneurysms. Given his age and medical comorbidities, he is not a good candidate for open repair of his aneurysms. Likewise, as described above he did not appear to be a good candidate for EVAR as this would require exclusion of both hypogastric arteries. Fortunately the aneurysms have only enlarged slightly over the last 4 years. I have recommended a conservative approach. The son who is with him today he is in agreement with that plan. I've ordered a follow up duplex in 6 months and I will see him back at that time. He's also had a previous carotid endarterectomy will obtain a carotid duplex scan at that time.   Johnathen Testa S [facility] [place] [facility]: [phone]

## 2013-03-06 NOTE — Assessment & Plan Note (Signed)
This patient has a 4.7 cm distal infrarenal abdominal aortic aneurysm, a 4.7 cm right common iliac artery aneurysm, and a 3.8 cm left common iliac artery aneurysm, and bilateral hypogastric artery aneurysms. Given his age and medical comorbidities, he is not a good candidate for open repair of his aneurysms. Likewise, as described above he did not appear to be a good candidate for EVAR as this would require exclusion of both hypogastric arteries. Fortunately the aneurysms have only enlarged slightly over the last 4 years. I have recommended a conservative approach. The son who is with him today he is in agreement with that plan. I've ordered a follow up duplex in 6 months and I will see him back at that time. He's also had a previous carotid endarterectomy will obtain a carotid duplex scan at that time.

## 2013-03-07 ENCOUNTER — Encounter: Payer: Self-pay | Admitting: *Deleted

## 2013-05-13 ENCOUNTER — Ambulatory Visit: Payer: Medicare Other | Admitting: Interventional Cardiology

## 2013-05-29 ENCOUNTER — Ambulatory Visit: Payer: Medicare Other | Admitting: Podiatry

## 2013-06-12 ENCOUNTER — Telehealth: Payer: Self-pay | Admitting: Cardiology

## 2013-06-12 NOTE — Telephone Encounter (Signed)
Spoke with rheumatology at [facility]. They diagnosed atrial fibrillation. Patient of Dr. Michaelle Copas. He is currently on aspirin and Plavix. He has had bypass and aortic valve replacement. He is not currently on anticoagulation. He will to discuss this hopefully tomorrow 06/13/13 with Dr. Katrinka Blazing to decide which anticoagulant and to confirm diagnosis of atrial fibrillation.

## 2013-06-13 ENCOUNTER — Encounter: Payer: Self-pay | Admitting: Interventional Cardiology

## 2013-06-13 ENCOUNTER — Ambulatory Visit (INDEPENDENT_AMBULATORY_CARE_PROVIDER_SITE_OTHER): Payer: Medicare Other | Admitting: Interventional Cardiology

## 2013-06-13 VITALS — BP 133/61 | HR 72 | Ht 65.0 in | Wt 176.0 lb

## 2013-06-13 DIAGNOSIS — M069 Rheumatoid arthritis, unspecified: Secondary | ICD-10-CM

## 2013-06-13 DIAGNOSIS — I4949 Other premature depolarization: Secondary | ICD-10-CM

## 2013-06-13 DIAGNOSIS — Z954 Presence of other heart-valve replacement: Secondary | ICD-10-CM

## 2013-06-13 DIAGNOSIS — Z952 Presence of prosthetic heart valve: Secondary | ICD-10-CM

## 2013-06-13 DIAGNOSIS — I493 Ventricular premature depolarization: Secondary | ICD-10-CM

## 2013-06-13 DIAGNOSIS — I10 Essential (primary) hypertension: Secondary | ICD-10-CM

## 2013-06-13 DIAGNOSIS — Z951 Presence of aortocoronary bypass graft: Secondary | ICD-10-CM

## 2013-06-13 NOTE — Patient Instructions (Addendum)
Your physician recommends that you continue on your current medications as directed. Please refer to the Current Medication list given to you today.  Your physician has recommended that you wear a holter monitor. Holter monitors are medical devices that record the heart's electrical activity. Doctors most often use these monitors to diagnose arrhythmias. Arrhythmias are problems with the speed or rhythm of the heartbeat. The monitor is a small, portable device. You can wear one while you do your normal daily activities. This is usually used to diagnose what is causing palpitations/syncope (passing out).  Keep your scheduled follow up appt

## 2013-06-13 NOTE — Progress Notes (Signed)
Patient ID: Hunter Choi, male   DOB: 1927/05/22, 80 y.o.   MRN: [id number]    [address] [place], [place]  [address] Phone: [phone] Fax:  [phone]  Date:  06/13/2013   ID:  Hunter Choi, DOB 09-13-1927, MRN [id number]  PCP:  Darnelle Bos, MD   ASSESSMENT:  1. Irregular heart rhythm. Felt to be atrial fibrillation when at the rheumatology office yesterday. I EKG today reveals sinus rhythm, grouped beating with a trigeminal pattern of one sinus beat and 2 PVCs. 2. Status post TAVR 3. Coronary artery disease with prior coronary bypass grafting  PLAN:  1. 48-hour Holter monitor to rule out atrial fibrillation   SUBJECTIVE: Hunter Choi is a 80 y.o. male who is here today at the request of rheumatology who felt an EKG performed at their office demonstrated atrial fibrillation. The patient is asymptomatic   Wt Readings from Last 3 Encounters:  06/13/13 176 lb (79.833 kg)  03/06/13 187 lb (84.823 kg)  02/27/13 183 lb (83.008 kg)     Past Medical History  Diagnosis Date  . Hypertension   . GERD (gastroesophageal reflux disease)   . Thyroid disease     HYPOTHYROIDISM  . Aortic stenosis, severe   . Embolism involving retinal artery   . BPH (benign prostatic hypertrophy)     Dr. Retta Diones  . Iliac artery aneurysm     Dr. Edilia Bo  . AAA (abdominal aortic aneurysm)     Dr. Ashley Royalty  . Diastolic heart failure   . 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 Coronary artery disease     Left main disease and severe 3-vessel CAD  . CAD (coronary artery disease) of bypass graft     Chronic occlusion of saphenous vein graft to RCA  . Hypothyroidism   . Shortness of breath   . Heart murmur   . Stroke     mini stroke effective his eye left  . Arthritis   . 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 acetaminophen (TYLENOL) 325 MG tablet Take 2 tablets (650 mg total) by mouth every 6 (six) hours as needed for pain.      Marland Kitchen amLODipine (NORVASC) 5 MG tablet Take 5 mg by mouth daily.      Marland Kitchen aspirin EC 81 MG EC tablet Take 1 tablet (81 mg total) by mouth daily.      . diazepam (VALIUM) 5 MG tablet Take 5 mg by mouth daily as needed for anxiety.      . folic acid (FOLVITE) 1 MG tablet Take 3 mg by mouth daily.      Marland Kitchen levothyroxine (SYNTHROID, LEVOTHROID) 25 MCG tablet Take 25 mcg by mouth 4 (four) times a week. Monday, Wednesday,Friday, and Saturday      . methotrexate 2.5 MG tablet Take 25 mg by mouth every Saturday.       . metoprolol tartrate (LOPRESSOR) 25 MG tablet Take 1 tablet (25 mg total) by mouth daily.  60 tablet  1  . omeprazole (PRILOSEC) 20 MG capsule Take 20 mg by mouth daily.      . predniSONE (DELTASONE) 5 MG tablet Take 5 mg by mouth  daily with breakfast.      . simvastatin (ZOCOR) 20 MG tablet Take 20 mg by mouth every evening.       No current facility-administered medications for this visit.    Allergies:    Allergies  Allergen Reactions  . Ciprofloxacin     REACTION: mild delirium    Social History:  The patient  reports that he quit smoking about 55 years ago. His smoking use included Cigarettes. He has a 23.5 pack-year smoking history. He has quit using smokeless tobacco. His smokeless tobacco use included Chew. He reports that he does not drink alcohol or use illicit drugs.   ROS:  Please see the history of present illness.   Denies chills, fever, orthopnea, PND, and edema.   All other systems reviewed and negative.   OBJECTIVE: VS:  BP 133/61  Pulse 72  Ht 5\' 5"  (1.651 m)  Wt 176 lb (79.833 kg)  BMI 29.29 kg/m2 Well nourished, well developed, in no acute distress, with obvious evidence of rheumatoid arthritis HEENT:  normal Neck: JVD flat. Carotid bruit absent  Cardiac:  normal S1, S2; IRR; no murmur Lungs:  clear to auscultation bilaterally, no wheezing, rhonchi or rales Abd: soft, nontender, no hepatomegaly Ext: Edema absent. Pulses palpable bilateral Skin: warm and dry Neuro:  CNs 2-12 intact, no focal abnormalities noted  EKG:  Sinus rhythm with grouped beating that includes a sinus beat followed by paired PVCs.       Signed, III, MD 06/13/2013 5:47 PM

## 2013-06-13 NOTE — Telephone Encounter (Signed)
appt made with Dr.Smith 06/13/13 @3pm  pt aware

## 2013-06-21 ENCOUNTER — Encounter (INDEPENDENT_AMBULATORY_CARE_PROVIDER_SITE_OTHER): Payer: Medicare Other

## 2013-06-21 ENCOUNTER — Encounter: Payer: Self-pay | Admitting: Radiology

## 2013-06-21 DIAGNOSIS — I493 Ventricular premature depolarization: Secondary | ICD-10-CM

## 2013-06-21 DIAGNOSIS — I4949 Other premature depolarization: Secondary | ICD-10-CM

## 2013-06-21 NOTE — Progress Notes (Signed)
Patient ID: Hunter Choi, male   DOB: [birth year], 80 y.o.   MRN: [id number] E cardio 48hr holter applied

## 2013-07-16 ENCOUNTER — Telehealth: Payer: Self-pay

## 2013-07-16 NOTE — Telephone Encounter (Signed)
pt wife given holter monitor results.occ blocked pac, occ mobitz 1 2nd degree avb, overall stable, no action required.pt wife verbalized understanding.

## 2013-07-17 ENCOUNTER — Ambulatory Visit (INDEPENDENT_AMBULATORY_CARE_PROVIDER_SITE_OTHER): Payer: Medicare Other | Admitting: Interventional Cardiology

## 2013-07-17 ENCOUNTER — Encounter: Payer: Self-pay | Admitting: Interventional Cardiology

## 2013-07-17 VITALS — BP 128/68 | HR 60 | Ht 65.0 in | Wt 173.0 lb

## 2013-07-17 DIAGNOSIS — Z954 Presence of other heart-valve replacement: Secondary | ICD-10-CM

## 2013-07-17 DIAGNOSIS — Z952 Presence of prosthetic heart valve: Secondary | ICD-10-CM

## 2013-07-17 DIAGNOSIS — I25810 Atherosclerosis of coronary artery bypass graft(s) without angina pectoris: Secondary | ICD-10-CM

## 2013-07-17 DIAGNOSIS — I10 Essential (primary) hypertension: Secondary | ICD-10-CM

## 2013-07-17 NOTE — Patient Instructions (Signed)
Your physician recommends that you continue on your current medications as directed. Please refer to the Current Medication list given to you today.  Your physician wants you to follow-up in: 9 months You will receive a reminder letter in the mail two months in advance. If you don't receive a letter, please call our office to schedule the follow-up appointment.  

## 2013-07-17 NOTE — Progress Notes (Signed)
Patient ID: EMMETT BRACKNELL, male   DOB: [birth year], 80 y.o.   MRN: [id number]    [address] [place], [place]  [address] Phone: [phone] Fax:  [phone]  Date:  07/17/2013   ID:  LEVIS NAZIR, DOB [birth year], MRN [id number]  PCP:  Darnelle Bos, MD   ASSESSMENT:  1. Atrial fibrillation has not occurred and was not found on monitoring 2. Coronary atherosclerosis stable without angina 3. Aortic stenosis treated with TAVR and normally functioning aortic valve at this time  PLAN:  1. no change in the current therapeutic regimen 2. No need for long-term anticoagulation therapy 3. 9 months clinical followup   SUBJECTIVE: Hunter Choi is a 80 y.o. male 48-hour monitor revealed no evidence of atrial fibrillation. Occasional Mobitz 1 second-degree heart block was noted. He is asymptomatic. Occasional dizziness. He has not had syncope, palpitations, dyspnea, or chest pain   Wt Readings from Last 3 Encounters:  07/17/13 173 lb (78.472 kg)  06/13/13 176 lb (79.833 kg)  03/06/13 187 lb (84.823 kg)     Past Medical History  Diagnosis Date  . Hypertension   . GERD (gastroesophageal reflux disease)   . Thyroid disease     HYPOTHYROIDISM  . Aortic stenosis, severe   . Embolism involving retinal artery   . BPH (benign prostatic hypertrophy)     Dr. Retta Diones  . Iliac artery aneurysm     Dr. Edilia Bo  . AAA (abdominal aortic aneurysm)     Dr. Ashley Royalty  . Diastolic heart failure   . 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 Coronary artery disease     Left main disease and severe 3-vessel CAD  . CAD (coronary artery disease) of bypass graft     Chronic occlusion of saphenous vein graft to RCA  . Hypothyroidism   . Shortness of breath   . Heart murmur   . Stroke     mini stroke effective his eye left  . Arthritis   . S/P TAVR (transcatheter aortic valve  replacement) 01/15/2013    29 mm Edwards Sapien XT transcatheter heart valve placed via transapical approach    Current Outpatient Prescriptions  Medication Sig Dispense Refill  . acetaminophen (TYLENOL) 325 MG tablet Take 2 tablets (650 mg total) by mouth every 6 (six) hours as needed for pain.      Marland Kitchen amLODipine (NORVASC) 5 MG tablet Take 5 mg by mouth daily.      Marland Kitchen aspirin EC 81 MG EC tablet Take 1 tablet (81 mg total) by mouth daily.      . diazepam (VALIUM) 5 MG tablet Take 5 mg by mouth daily as needed for anxiety.      . folic acid (FOLVITE) 1 MG tablet Take 3 mg by mouth daily.      Marland Kitchen levothyroxine (SYNTHROID, LEVOTHROID) 25 MCG tablet Take 25 mcg by mouth 4 (four) times a week. Monday, Wednesday,Friday, and Saturday      . methotrexate 2.5 MG tablet Take 25 mg by mouth every Saturday.       . metoprolol tartrate (LOPRESSOR) 25 MG tablet Take 1 tablet (25 mg total) by mouth daily.  60 tablet  1  . omeprazole (PRILOSEC) 20 MG capsule Take 20 mg by mouth daily.      . predniSONE (DELTASONE) 5 MG tablet Take 5  mg by mouth daily with breakfast.      . simvastatin (ZOCOR) 20 MG tablet Take 20 mg by mouth every evening.       No current facility-administered medications for this visit.    Allergies:    Allergies  Allergen Reactions  . Ciprofloxacin     REACTION: mild delirium    Social History:  The patient  reports that he quit smoking about 55 years ago. His smoking use included Cigarettes. He has a 23.5 pack-year smoking history. He has quit using smokeless tobacco. His smokeless tobacco use included Chew. He reports that he does not drink alcohol or use illicit drugs.   ROS:  Please see the history of present illness.      All other systems reviewed and negative.   OBJECTIVE: VS:  BP 128/68  Pulse 60  Ht 5\' 5"  (1.651 m)  Wt 173 lb (78.472 kg)  BMI 28.79 kg/m2 Well nourished, well developed, in no acute distress, elderly HEENT: normal Neck: JVD flat. Carotid bruit absent    Cardiac:  normal S1, S2; RRR; 1/6 systolic murmur right upper sternal border murmur Lungs:  clear to auscultation bilaterally, no wheezing, rhonchi or rales Abd: soft, nontender, no hepatomegaly Ext: Edema none. Pulses 2+ bilateral Skin: warm and dry Neuro:  CNs 2-12 intact, no focal abnormalities noted  EKG:  Not repeated       Signed, III, MD 07/17/2013 4:15 PM

## 2013-09-03 ENCOUNTER — Encounter: Payer: Self-pay | Admitting: Family

## 2013-09-04 ENCOUNTER — Ambulatory Visit (INDEPENDENT_AMBULATORY_CARE_PROVIDER_SITE_OTHER)
Admission: RE | Admit: 2013-09-04 | Discharge: 2013-09-04 | Disposition: A | Discharge status: Home / Self Care | Payer: Medicare Other | Source: Ambulatory Visit | Attending: Family | Admitting: Family

## 2013-09-04 ENCOUNTER — Encounter: Payer: Self-pay | Admitting: Family

## 2013-09-04 ENCOUNTER — Ambulatory Visit (INDEPENDENT_AMBULATORY_CARE_PROVIDER_SITE_OTHER): Payer: Medicare Other | Admitting: Family

## 2013-09-04 ENCOUNTER — Ambulatory Visit (HOSPITAL_COMMUNITY)
Admission: RE | Admit: 2013-09-04 | Discharge: 2013-09-04 | Disposition: A | Discharge status: Home / Self Care | Payer: Medicare Other | Source: Ambulatory Visit | Attending: Family | Admitting: Family

## 2013-09-04 VITALS — BP 133/60 | HR 58 | Resp 14 | Ht 63.0 in | Wt 168.0 lb

## 2013-09-04 DIAGNOSIS — Z48812 Encounter for surgical aftercare following surgery on the circulatory system: Secondary | ICD-10-CM

## 2013-09-04 DIAGNOSIS — I714 Abdominal aortic aneurysm, without rupture, unspecified: Secondary | ICD-10-CM

## 2013-09-04 DIAGNOSIS — I6529 Occlusion and stenosis of unspecified carotid artery: Secondary | ICD-10-CM | POA: Insufficient documentation

## 2013-09-04 DIAGNOSIS — I658 Occlusion and stenosis of other precerebral arteries: Secondary | ICD-10-CM | POA: Insufficient documentation

## 2013-09-04 NOTE — Progress Notes (Signed)
[facility] [place] HISTORY AND PHYSICAL   MRN : [id number]  History of Present Illness:   Hunter Choi is a 80 y.o. male patient that Dr. Edilia Bo has been following with an abdominal aortic aneurysm and bilateral common iliac artery aneurysms. He also has a history of right carotid endarterectomy in 1997. He returns today for follow up of both.  His aneurysms had been stable in size over several years and we were continuing his follow up at yearly intervals. However he was then lost to follow up. This patient presented with severe aortic stenosis. He underwent transcatheter aortic valve replacement using a trans-apical approach by Dr. Laneta Simmers and Dr. Excell Seltzer. His CT scan in October prior to his procedure showed that his abdominal aorta are measured 4.7 cm at the level of the bifurcation. The right common iliac artery measured 4.6 cm in maximum diameter and the left common iliac artery 3.8 centimeters in maximum diameter. Back in 2010, the distal aorta measured 3.7 cm in maximum diameter. The right common iliac artery measured 3.7 cm in maximum diameter. The left common iliac artery measured 3.5 cm in maximum diameter.  The patient has done amazingly well since his aortic valve replacement. He denies any abdominal pain or back pain. He shortness of breath improved significantly after surgery. He had a mild stroke as manifested by left eye blindness, right before his 1997 right CEA, no stroke or TIA activity since then. 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 He denies any known history of hemiparesis or aphasia. He denies any unusual back or abdominal pain, he does report chronic intermittent mid back pain, denies sciatic type pain. He uses a walker since he fell and fractured his left hip which was repaired about 1995. He takes a daily ASA, statin, and beta blocker.   Pt Diabetic: No Pt smoker: former smoker, quit in 1961  Current  Outpatient Prescriptions  Medication Sig Dispense Refill  . acetaminophen (TYLENOL) 325 MG tablet Take 2 tablets (650 mg total) by mouth every 6 (six) hours as needed for pain.      Marland Kitchen amLODipine (NORVASC) 5 MG tablet Take 5 mg by mouth daily.      Marland Kitchen aspirin EC 81 MG EC tablet Take 1 tablet (81 mg total) by mouth daily.      . diazepam (VALIUM) 5 MG tablet Take 5 mg by mouth daily as needed for anxiety.      . folic acid (FOLVITE) 1 MG tablet Take 3 mg by mouth daily.      Marland Kitchen levothyroxine (SYNTHROID, LEVOTHROID) 25 MCG tablet Take 25 mcg by mouth 4 (four) times a week. Monday, Wednesday,Friday, and Saturday      . methotrexate 2.5 MG tablet Take 25 mg by mouth every Saturday.       . metoprolol tartrate (LOPRESSOR) 25 MG tablet Take 1 tablet (25 mg total) by mouth daily.  60 tablet  1  . omeprazole (PRILOSEC) 20 MG capsule Take 20 mg by mouth daily.      . predniSONE (DELTASONE) 5 MG tablet Take 5 mg by mouth daily with breakfast.      . simvastatin (ZOCOR) 20 MG tablet Take 20 mg by mouth every evening.       No current facility-administered medications for this visit.     Past Medical History  Diagnosis Date  . Hypertension   . GERD (gastroesophageal reflux disease)   .  Thyroid disease     HYPOTHYROIDISM  . Aortic stenosis, severe   . Embolism involving retinal artery   . BPH (benign prostatic hypertrophy)     Dr. Retta Diones  . Iliac artery aneurysm     Dr. Edilia Bo  . AAA (abdominal aortic aneurysm)     Dr. Ashley Royalty  . Diastolic heart failure   . 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 Coronary artery disease     Left main disease and severe 3-vessel CAD  . CAD (coronary artery disease) of bypass graft     Chronic occlusion of saphenous vein graft to RCA  . Hypothyroidism   . Shortness of breath   . Heart murmur   . Stroke     mini stroke effective his eye left  . Arthritis   .  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 Left hip hemiarthropasty      AFTER HIP FX  . Coronary artery bypass graft      1999..Dr. Sheliah Plane  . Carotid endarterectomy    . Total knee arthroplasty      right 1992   left  2002  . Biopsy prostate      2005  . Prostate surgery  2012    TURP  . Joint replacement  2007    THR  . Harvest bone graft  1982    FOR THE  ANKLE  . Skin grafts      1968  . Colonoscopy    . Toe surgery Bilateral   . Transcatheter aortic valve replacement, transapical N/A 01/15/2013    Procedure: TRANSCATHETER AORTIC VALVE REPLACEMENT, TRANSAPICAL;  Surgeon: [name], MD;  Location: [facility] OR;  Service: Open Heart Surgery;  Laterality: N/A;  . Intraoperative transesophageal echocardiogram N/A 01/15/2013    Procedure: INTRAOPERATIVE TRANSESOPHAGEAL ECHOCARDIOGRAM;  Surgeon: [name], MD;  Location[facility] OR;  Service: Open Heart Surgery;  Laterality: N/A;    Social History History  Substance Use Topics  . Smoking status: Former Smoker -- 0.50 packs/day for 47 years    Types: Cigarettes    Quit date: 03/28/1958  . Smokeless tobacco: Former Neurosurgeon    Types: Chew     Comment: ONLY USED 2 YEARS  . Alcohol Use: No    Family History Family History  Problem Relation Age of Onset  . Heart attack Mother   . Heart disease Mother     Before age 49  . Hyperlipidemia Mother   . Hypertension Son   . Heart disease Father     AAA  . Hyperlipidemia Father   . Heart attack Father   . Heart disease Brother     Before age 45  . Hyperlipidemia Brother   . Heart attack Brother    Allergies  Allergen Reactions  . Ciprofloxacin Other (See Comments)    REACTION: mild delirium     REVIEW OF SYSTEMS: See HPI for pertinent positives and negatives.  Physical Examination Filed Vitals:    09/04/13 1133 09/04/13 1136  BP: 123/57 133/60  Pulse: 60 58  Resp:  14  Height:  5\' 3"  (1.6 m)  Weight:  168 lb (76.204 kg)  SpO2:  98%   Body mass  index is 29.77 kg/(m^2).  General:  WDWN in NAD Gait: Normal HENT: WNL Eyes: Pupils equal Pulmonary: normal non-labored breathing , without Rales, rhonchi,  wheezing Cardiac: RRR, no murmurs detected  Abdomen: soft, NT, no masses Skin: no rashes, ulcers noted;  no Gangrene , no cellulitis; no open wounds;   VASCULAR EXAM  Carotid Bruits Left Right   Negative Positive                             VASCULAR EXAM: Extremities without ischemic changes  without Gangrene; without open wounds.                                                                                                          LE Pulses LEFT RIGHT       FEMORAL  3+ palpable  3+ palpable        POPLITEAL  not palpable   2+ palpable       POSTERIOR TIBIAL  not palpable   not palpable        DORSALIS PEDIS      ANTERIOR TIBIAL not palpable  faintly palpable     Musculoskeletal: no muscle wasting or atrophy; no edema  Neurologic: A&O X 3; Appropriate Affect ;  SENSATION: normal; MOTOR FUNCTION: 5/5 in UE's, 4/5 in LE'sSymmetric, CN 2-12 intact except for hard of hearing and loss of vision in left eye Speech is fluent/normal   Non-Invasive Vascular Imaging (09/04/2013):  ABDOMINAL AORTA DUPLEX EVALUATION    INDICATION: Abdominal aortic aneurysm    PREVIOUS INTERVENTION(S):     DUPLEX EXAM:     LOCATION DIAMETER AP (cm) DIAMETER TRANSVERSE (cm) VELOCITIES (cm/sec)  Aorta Proximal Not Visualized  Not Visualized    Aorta Mid 2.6 2.5 94  Aorta Distal 4.5 4.7 44  Right Common Iliac Artery 4.7 4.9 20  Left Common Iliac Artery 4.1 4.3 18    Previous max aortic diameter:  4.7cm (Right CIA = 4.6) (Left CIA = 4.0) Date: 12/31/12 (CT)     ADDITIONAL FINDINGS: Decreased visualization of the abdominal vasculature due to overlying bowel gas.      IMPRESSION: 1. Aneurysmal dilatation of the distal abdominal aorta and bilateral proximal/mid common iliac arteries with maximum diameter measurements noted above. 2. No hemodynamically significant stenosis of the abdominal aorta and bilateral proximal common iliac arteries.     Compared to the previous exam:  No significant change in the maximum diameters of the abdominal aorta and bilateral common iliac artery aneurysms when compared to the previous exam.     CEREBROVASCULAR DUPLEX EVALUATION    INDICATION: Carotid endarterectomy     PREVIOUS INTERVENTION(S): Right carotid endarterectomy in 1997    DUPLEX EXAM:     RIGHT  LEFT  Peak Systolic Velocities (cm/s) End Diastolic Velocities (cm/s) Plaque LOCATION Peak Systolic Velocities (cm/s) End Diastolic Velocities (cm/s) Plaque  125 16  CCA PROXIMAL 45 0   102 17 HT CCA MID 61 0 HT  120 23 HT CCA DISTAL 53 0  HT  103 0 HT ECA 227 9 HT  83 20 CP ICA PROXIMAL Occluded  HT  101 20  ICA MID Occluded  HT  90 22  ICA DISTAL Occluded  HT    Not Calculated ICA / CCA Ratio (PSV) Not Calculated  Bidirectional Vertebral Flow Antegrade  136 Brachial Systolic Pressure (mmHg) 144  Multiphasic (subclavian artery) Brachial Artery Waveforms Multiphasic (subclavian artery)    Plaque Morphology:  HM = Homogeneous, HT = Heterogeneous, CP = Calcific Plaque, SP = Smooth Plaque, IP = Irregular Plaque     ADDITIONAL FINDINGS:   No significant stenosis of the right external or bilateral common carotid arteries.   The left external carotid artery stenosis.   Mostly antegrade, bidirectional flow noted in the right vertebral artery.   Velocity of 254cm/s noted in the right proximal subclavian artery.    IMPRESSION: 1. Patent right carotid endarterectomy site with Doppler velocities suggestive of a less than 40% stenosis of the right proximal internal carotid artery. 2. Known occlusion of the left internal carotid artery.    Compared to the previous  exam:  No significant change in the right carotid artery velocities when compared to the previous exam on 03/24/10.    ASSESSMENT:  Hunter Choi is a 80 y.o. male we have been following for an abdominal aortic aneurysm and bilateral common iliac artery aneurysms. He also has a history of right carotid endarterectomy in 1997. He has had no stroke or TIA activity since 1997 and has no back or abdominal pain that is referable to a AAA. Patent right carotid endarterectomy site with Doppler velocities suggestive of a less than 40% stenosis of the right proximal internal carotid artery. Known occlusion of the left internal carotid artery. Aneurysmal dilatation of the distal abdominal aorta and bilateral proximal/mid common iliac arteries with maximum diameter measurements noted above. No hemodynamically significant stenosis of the abdominal aorta and bilateral proximal common iliac arteries. No significant change in the maximum diameters of the abdominal aorta and bilateral common iliac artery aneurysms when compared to the previous exam. The above and patient's HPI, ROS, and physical exam results were discussed with Dr. Edilia Bo, conservative approach remains due to his age an commodities.   PLAN:   Based on today's exam and non-invasive vascular lab results, and after discussing with Dr. Edilia Bo, the patient will follow up in 6 months with the following tests AAA Duplex, carotid artery Duplex and see Dr. Edilia Bo. I discussed in depth with the patient the nature of atherosclerosis, and emphasized the importance of maximal medical management including strict control of blood pressure, blood glucose, and lipid levels, obtaining regular exercise, and cessation of smoking.  The patient is aware that without maximal medical management the underlying atherosclerotic disease process will progress, limiting the benefit of any interventions. The patient was given information about stroke prevention and what  symptoms should prompt the patient to seek immediate medical care. The patient was given information about AAA including signs, symptoms, treatment,  what symptoms should prompt the patient to seek immediate medical care, and how to minimize the risk of enlargement and rupture of aneurysms.  Thank you for allowing [place] to participate in this patient's care.  Charisse March, RN, MSN, FNP-C [facility] Office: [phone]  Clinic MD: Edilia Bo 09/04/2013 11:28 AM

## 2013-09-04 NOTE — Patient Instructions (Signed)
Stroke Prevention Some medical conditions and behaviors are associated with an increased chance of having a stroke. You may prevent a stroke by making healthy choices and managing medical conditions. HOW CAN I REDUCE MY RISK OF HAVING A STROKE?   Stay physically active. Get at least 30 minutes of activity on most or all days.  Do not smoke. It may also be helpful to avoid exposure to secondhand smoke.  Limit alcohol use. Moderate alcohol use is considered to be:  No more than 2 drinks per day for men.  No more than 1 drink per day for nonpregnant women.  Eat healthy foods. This involves  Eating 5 or more servings of fruits and vegetables a day.  Following a diet that addresses high blood pressure (hypertension), high cholesterol, diabetes, or obesity.  Manage your cholesterol levels.  A diet low in saturated fat, trans fat, and cholesterol and high in fiber may control cholesterol levels.  Take any prescribed medicines to control cholesterol as directed by your health care provider.  Manage your diabetes.  A controlled-carbohydrate, controlled-sugar diet is recommended to manage diabetes.  Take any prescribed medicines to control diabetes as directed by your health care provider.  Control your hypertension.  A low-salt (sodium), low-saturated fat, low-trans fat, and low-cholesterol diet is recommended to manage hypertension.  Take any prescribed medicines to control hypertension as directed by your health care provider.  Maintain a healthy weight.  A reduced-calorie, low-sodium, low-saturated fat, low-trans fat, low-cholesterol diet is recommended to manage weight.  Stop drug abuse.  Avoid taking birth control pills.  Talk to your health care provider about the risks of taking birth control pills if you are over 35 years old, smoke, get migraines, or have ever had a blood clot.  Get evaluated for sleep disorders (sleep apnea).  Talk to your health care provider about  getting a sleep evaluation if you snore a lot or have excessive sleepiness.  Take medicines as directed by your health care provider.  For some people, aspirin or blood thinners (anticoagulants) are helpful in reducing the risk of forming abnormal blood clots that can lead to stroke. If you have the irregular heart rhythm of atrial fibrillation, you should be on a blood thinner unless there is a good reason you cannot take them.  Understand all your medicine instructions.  Make sure that other other conditions (such as anemia or atherosclerosis) are addressed. SEEK IMMEDIATE MEDICAL CARE IF:   You have sudden weakness or numbness of the face, arm, or leg, especially on one side of the body.  Your face or eyelid droops to one side.  You have sudden confusion.  You have trouble speaking (aphasia) or understanding.  You have sudden trouble seeing in one or both eyes.  You have sudden trouble walking.  You have dizziness.  You have a loss of balance or coordination.  You have a sudden, severe headache with no known cause.  You have new chest pain or an irregular heartbeat. Any of these symptoms may represent a serious problem that is an emergency. Do not wait to see if the symptoms will go away. Get medical help at once. Call your local emergency services  (911 in [place] Do not drive yourself to the hospital. Document Released: 04/21/2004 Document Revised: 01/02/2013 Document Reviewed: 09/14/2012 Exit[employer] Patient Information 2014 [employer]   Abdominal Aortic Aneurysm An aneurysm is a weakened or damaged part of an artery wall that bulges from the normal force of blood pumping through   the body. An abdominal aortic aneurysm is an aneurysm that occurs in the lower part of the aorta, the main artery of the body.  The major concern with an abdominal aortic aneurysm is that it can enlarge and burst (rupture) or blood can flow between the layers of the wall of the aorta through a  tear (aorticdissection). Both of these conditions can cause bleeding inside the body and can be life threatening unless diagnosed and treated promptly. CAUSES  The exact cause of an abdominal aortic aneurysm is unknown. Some contributing factors are:   A hardening of the arteries caused by the buildup of fat and other substances in the lining of a blood vessel (arteriosclerosis).  Inflammation of the walls of an artery (arteritis).   Connective tissue diseases, such as Marfan syndrome.   Abdominal trauma.   An infection, such as syphilis or staphylococcus, in the wall of the aorta (infectious aortitis) caused by bacteria. RISK FACTORS  Risk factors that contribute to an abdominal aortic aneurysm may include:  Age older than 60 years.   High blood pressure (hypertension).  Male gender.  Ethnicity (white race).  Obesity.  Family history of aneurysm (first degree relatives only).  Tobacco use. PREVENTION  The following healthy lifestyle habits may help decrease your risk of abdominal aortic aneurysm:  Quitting smoking. Smoking can raise your blood pressure and cause arteriosclerosis.  Limiting or avoiding alcohol.  Keeping your blood pressure, blood sugar level, and cholesterol levels within normal limits.  Decreasing your salt intake. In somepeople, too much salt can raise blood pressure and increase your risk of abdominal aortic aneurysm.  Eating a diet low in saturated fats and cholesterol.  Increasing your fiber intake by including whole grains, vegetables, and fruits in your diet. Eating these foods may help lower blood pressure.  Maintaining a healthy weight.  Staying physically active and exercising regularly. SYMPTOMS  The symptoms of abdominal aortic aneurysm may vary depending on the size and rate of growth of the aneurysm.Most grow slowly and do not have any symptoms. When symptoms do occur, they may include:  Pain (abdomen, side, lower back, or  groin). The pain may vary in intensity. A sudden onset of severe pain may indicate that the aneurysm has ruptured.  Feeling full after eating only small amounts of food.  Nausea or vomiting or both.  Feeling a pulsating lump in the abdomen.  Feeling faint or passing out. DIAGNOSIS  Since most unruptured abdominal aortic aneurysms have no symptoms, they are often discovered during diagnostic exams for other conditions. An aneurysm may be found during the following procedures:  Ultrasonography (A one-time screening for abdominal aortic aneurysm by ultrasonography is also recommended for all men aged 65-75 years who have ever smoked).  X-ray exams.  A computed tomography (CT).  Magnetic resonance imaging (MRI).  Angiography or arteriography. TREATMENT  Treatment of an abdominal aortic aneurysm depends on the size of your aneurysm, your age, and risk factors for rupture. Medication to control blood pressure and pain may be used to manage aneurysms smaller than 6 cm. Regular monitoring for enlargement may be recommended by your caregiver if:  The aneurysm is 3 4 cm in size (an annual ultrasonography may be recommended).  The aneurysm is 4 4.5 cm in size (an ultrasonography every 6 months may be recommended).  The aneurysm is larger than 4.5 cm in size (your caregiver may ask that you be examined by a vascular surgeon). If your aneurysm is larger than 6 cm, surgical   repair may be recommended. There are two main methods for repair of an aneurysm:   Endovascular repair (a minimally invasive surgery). This is done most often.  Open repair. This method is used if an endovascular repair is not possible. Document Released: 12/22/2004 Document Revised: 07/09/2012 Document Reviewed: 04/13/2012 [employer] Patient Information 2014 [employer].  

## 2013-09-04 NOTE — Addendum Note (Signed)
Addended by: Sharee Pimple on: 09/04/2013 04:27 PM   Modules accepted: Orders

## 2013-12-19 ENCOUNTER — Other Ambulatory Visit: Payer: Self-pay | Admitting: *Deleted

## 2013-12-19 DIAGNOSIS — I359 Nonrheumatic aortic valve disorder, unspecified: Secondary | ICD-10-CM

## 2014-01-13 ENCOUNTER — Ambulatory Visit: Payer: Medicare Other | Admitting: Thoracic Surgery (Cardiothoracic Vascular Surgery)

## 2014-01-13 ENCOUNTER — Other Ambulatory Visit (HOSPITAL_COMMUNITY): Payer: Medicare Other

## 2014-01-13 ENCOUNTER — Ambulatory Visit (HOSPITAL_COMMUNITY)
Admission: RE | Admit: 2014-01-13 | Discharge: 2014-01-13 | Disposition: A | Discharge status: Home / Self Care | Payer: Medicare Other | Source: Ambulatory Visit | Attending: Internal Medicine | Admitting: Internal Medicine

## 2014-01-13 DIAGNOSIS — I350 Nonrheumatic aortic (valve) stenosis: Secondary | ICD-10-CM | POA: Insufficient documentation

## 2014-01-13 DIAGNOSIS — Z952 Presence of prosthetic heart valve: Secondary | ICD-10-CM | POA: Diagnosis not present

## 2014-01-13 DIAGNOSIS — I359 Nonrheumatic aortic valve disorder, unspecified: Secondary | ICD-10-CM

## 2014-01-13 DIAGNOSIS — I059 Rheumatic mitral valve disease, unspecified: Secondary | ICD-10-CM

## 2014-01-13 NOTE — Progress Notes (Signed)
Echocardiogram 2D Echocardiogram has been performed.  Hunter Choi 01/13/2014, 11:23 AM

## 2014-01-15 ENCOUNTER — Encounter: Payer: Self-pay | Admitting: Surgery

## 2014-01-15 ENCOUNTER — Ambulatory Visit (INDEPENDENT_AMBULATORY_CARE_PROVIDER_SITE_OTHER): Payer: Medicare Other | Admitting: Surgery

## 2014-01-15 VITALS — BP 117/63 | HR 64 | Resp 20 | Ht 63.0 in | Wt 161.0 lb

## 2014-01-15 DIAGNOSIS — Z952 Presence of prosthetic heart valve: Secondary | ICD-10-CM

## 2014-01-15 DIAGNOSIS — Z951 Presence of aortocoronary bypass graft: Secondary | ICD-10-CM

## 2014-01-15 DIAGNOSIS — Z954 Presence of other heart-valve replacement: Secondary | ICD-10-CM

## 2014-01-16 ENCOUNTER — Encounter: Payer: Self-pay | Admitting: Surgery

## 2014-01-16 NOTE — Progress Notes (Signed)
HPI:  Patient returns today for one year follow up after TAVR on 01/15/2013. He has been feeling well and is walking with his rolling walker for stabilization. He denies chest pain, shortness of breath, and dizziness.  Current Outpatient Prescriptions  Medication Sig Dispense Refill  . acetaminophen (TYLENOL) 325 MG tablet Take 2 tablets (650 mg total) by mouth every 6 (six) hours as needed for pain.      Marland Kitchen amLODipine (NORVASC) 5 MG tablet Take 5 mg by mouth daily.      Marland Kitchen aspirin EC 81 MG EC tablet Take 1 tablet (81 mg total) by mouth daily.      . diazepam (VALIUM) 5 MG tablet Take 5 mg by mouth daily as needed for anxiety.      . folic acid (FOLVITE) 1 MG tablet Take 3 mg by mouth daily.      Marland Kitchen levothyroxine (SYNTHROID, LEVOTHROID) 25 MCG tablet Take 25 mcg by mouth 4 (four) times a week. Monday, Wednesday,Friday, and Saturday      . methotrexate 2.5 MG tablet Take 25 mg by mouth every Saturday.       . metoprolol tartrate (LOPRESSOR) 25 MG tablet Take 1 tablet (25 mg total) by mouth daily.  60 tablet  1  . omeprazole (PRILOSEC) 20 MG capsule Take 20 mg by mouth daily.      . predniSONE (DELTASONE) 5 MG tablet Take 5 mg by mouth every other day.       . simvastatin (ZOCOR) 20 MG tablet Take 20 mg by mouth daily at 6 PM.        No current facility-administered medications for this visit.     Physical Exam: BP 117/63  Pulse 64  Resp 20  Ht 5\' 3"  (1.6 m)  Wt 161 lb (73.029 kg)  BMI 28.53 kg/m2  SpO2 95% He looks well for an 80 year old with severe rheumatoid arthritis Lungs are clear Cardiac exam shows a regular rate and rhythm with normal heart sounds. There is no murmur The left chest scar is barely visible. There is no peripheral edema  Diagnostic Tests:  *[facility]* *[facility]* [address] [place], [place] [id number]  ------------------------------------------------------------------- Transthoracic Echocardiography  Patient:  Hunter Choi, Hunter Choi MR #: [name] Study Date: 01/13/2014 Gender: M Age: 89 Height: 160 cm Weight: 76.2 kg BSA: 1.87 m^2 Pt. Status: Room:  ORDERING 01/15/2014, MD REFERRING Evelene Croon, MD ATTENDING Evelene Croon REFERRING Darnelle Bos SONOGRAPHER Jimmy Reel, RDCS PERFORMING [facility], Outpatient  cc:  ------------------------------------------------------------------- LV EF: 40% - 45%  ------------------------------------------------------------------- Indications: V43.3 S/P Heart valve replacement. 424.1 Aortic valve disorders.  ------------------------------------------------------------------- History: PMH: Coronary artery disease. Congestive heart failure.  ------------------------------------------------------------------- Study Conclusions  - Left ventricle: The cavity size was normal. Systolic function was mildly to moderately reduced. The estimated ejection fraction was in the range of 40% to 45%. Diffuse hypokinesis. There is hypokinesis of the apicalanteroseptal and inferior myocardium. Doppler parameters are consistent with abnormal left ventricular relaxation (grade 1 diastolic dysfunction). Doppler parameters are consistent with high ventricular filling pressure. - Aortic valve: A bioprosthesis was present, functioning properly. There was trivial regurgitation. Valve area (VTI): 1.64 cm^2. - Mitral valve: There was mild regurgitation.  Impressions:  - When compared to prior, EF is reduced.  ------------------------------------------------------------------- Labs, prior tests, procedures, and surgery: Echo done 02-08-2013 reported an EF of 50-55% Coronary artery bypass grafting. Transthoracic echocardiography. M-mode, complete 2D, spectral Doppler, and color Doppler. Birthdate: Patient  birthdate: 06/01/1927. Age: Patient is 80 yr old. Sex: Gender: male. BMI: 29.8 kg/m^2. Blood pressure: 133/60 Patient status: Outpatient. Study date:  Study date: 01/13/2014. Study time: 11:28 AM. Location: Echo laboratory.  -------------------------------------------------------------------  ------------------------------------------------------------------- Left ventricle: The cavity size was normal. Systolic function was mildly to moderately reduced. The estimated ejection fraction was in the range of 40% to 45%. Diffuse hypokinesis. Regional wall motion abnormalities: There is hypokinesis of the apicalanteroseptal and inferior myocardium. Doppler parameters are consistent with abnormal left ventricular relaxation (grade 1 diastolic dysfunction). Doppler parameters are consistent with high ventricular filling pressure.  ------------------------------------------------------------------- Aortic valve: A bioprosthesis was present, functioning properly. Doppler: There was trivial regurgitation. VTI ratio of LVOT to aortic valve: 0.47. Valve area (VTI): 1.64 cm^2. Indexed valve area (VTI): 0.88 cm^2/m^2. Valve area (Vmax): 1.48 cm^2. Indexed valve area (Vmax): 0.79 cm^2/m^2. Mean velocity ratio of LVOT to aortic valve: 0.39. Valve area (Vmean): 1.35 cm^2. Indexed valve area (Vmean): 0.73 cm^2/m^2. Mean gradient (S): 5 mm Hg. Peak gradient (S): 10 mm Hg.  ------------------------------------------------------------------- Aorta: The aorta was normal, not dilated, and non-diseased.  ------------------------------------------------------------------- Mitral valve: Structurally normal valve. Leaflet separation was normal. Doppler: Transvalvular velocity was within the normal range. There was no evidence for stenosis. There was mild regurgitation.  ------------------------------------------------------------------- Left atrium: The atrium was normal in size.  ------------------------------------------------------------------- Right ventricle: The cavity size was normal. Wall thickness was normal. Systolic function was  normal.  ------------------------------------------------------------------- Pulmonic valve: Poorly visualized. Structurally normal valve. Cusp separation was normal. Doppler: Transvalvular velocity was within the normal range. There was no regurgitation.  ------------------------------------------------------------------- Tricuspid valve: Structurally normal valve. Leaflet separation was normal. Doppler: Transvalvular velocity was within the normal range. There was no regurgitation.  ------------------------------------------------------------------- Right atrium: The atrium was normal in size.  ------------------------------------------------------------------- Pericardium: The pericardium was normal in appearance.  ------------------------------------------------------------------- Systemic veins: Inferior vena cava: The vessel was normal in size. The respirophasic diameter changes were in the normal range (>= 50%), consistent with normal central venous pressure.  ------------------------------------------------------------------- Post procedure conclusions Ascending Aorta:  - The aorta was normal, not dilated, and non-diseased.  ------------------------------------------------------------------- Measurements  Left ventricle Value Reference LV ID, ED, PLAX chordal (H) 55.6 mm 43 - 52 LV ID, ES, PLAX chordal (H) 41.2 mm 23 - 38 LV fx shortening, PLAX chordal (L) 26 % >=29 LV PW thickness, ED 10 mm --------- IVS/LV PW ratio, ED 1.13 <=1.3 Stroke volume, 2D 57 ml --------- Stroke volume/bsa, 2D 31 ml/m^2 --------- LV e&', lateral 9 cm/s --------- LV E/e&', lateral 6.73 --------- LV e&', medial 3.5 cm/s --------- LV E/e&', medial 17.31 --------- LV e&', average 6.25 cm/s --------- LV E/e&', average 9.7 ---------  Ventricular septum Value Reference IVS thickness, ED 11.3 mm ---------  LVOT Value Reference LVOT ID, S 21 mm --------- LVOT area 3.46 cm^2  --------- LVOT peak velocity, S 69.4 cm/s --------- LVOT mean velocity, S 40.3 cm/s --------- LVOT VTI, S 16.6 cm --------- LVOT peak gradient, S 2 mm Hg ---------  Aortic valve Value Reference Aortic valve mean velocity, S 103 cm/s --------- Aortic valve VTI, S 35.1 cm --------- Aortic mean gradient, S 5 mm Hg --------- Aortic peak gradient, S 10 mm Hg --------- VTI ratio, LVOT/AV 0.47 --------- Aortic valve area, VTI 1.64 cm^2 --------- Aortic valve area/bsa, VTI 0.88 cm^2/m^2 --------- Aortic valve area, peak velocity 1.48 cm^2 --------- Aortic valve area/bsa, peak 0.79 cm^2/m^2 --------- velocity Velocity ratio, mean, LVOT/AV 0.39 --------- Aortic valve area, mean velocity 1.35 cm^2 --------- Aortic valve area/bsa, mean  0.73 cm^2/m^2 --------- velocity  Aorta Value Reference Aortic root ID, ED 30 mm ---------  Left atrium Value Reference LA ID, A-P, ES 37 mm --------- LA ID/bsa, A-P 1.98 cm/m^2 <=2.2 LA volume, ES, 1-p A4C 56.8 ml --------- LA volume/bsa, ES, 1-p A4C 30.4 ml/m^2 --------- LA volume, ES, 1-p A2C 60.1 ml --------- LA volume/bsa, ES, 1-p A2C 32.2 ml/m^2 ---------  Mitral valve Value Reference Mitral E-wave peak velocity 60.6 cm/s --------- Mitral A-wave peak velocity 94.1 cm/s --------- Mitral deceleration time (H) 338 ms 150 - 230 Mitral E/A ratio, peak 0.6 ---------  Legend: (L) and (H) mark values outside specified reference range.  ------------------------------------------------------------------- Prepared and Electronically Authenticated by  Donato Schultz, M.D. [id number]:[id number]:31   Impression:  He is doing very well one year after TAVR which is remarkable because he looked terrible before the procedure. His echo shows normal prosthetic valve function with trivial AI that looks unchanged from his intraop TEE. His mean AV gradient is 5 mm Hg. He filled out the [employer] questionnaire today.  Plan:  He will continue to follow up with Dr.  Earl Gala and Dr. Katrinka Blazing and I will see him back in 1 year with an echo.

## 2014-02-19 ENCOUNTER — Inpatient Hospital Stay (HOSPITAL_COMMUNITY)
Admission: EM | Admit: 2014-02-19 | Discharge: 2014-02-22 | DRG: 871 | Disposition: A | Discharge status: Home / Self Care | Payer: Medicare Other | Attending: Internal Medicine | Admitting: Internal Medicine

## 2014-02-19 ENCOUNTER — Emergency Department (HOSPITAL_COMMUNITY): Payer: Medicare Other

## 2014-02-19 ENCOUNTER — Encounter (HOSPITAL_COMMUNITY): Payer: Self-pay | Admitting: Emergency Medicine

## 2014-02-19 DIAGNOSIS — E039 Hypothyroidism, unspecified: Secondary | ICD-10-CM | POA: Diagnosis not present

## 2014-02-19 DIAGNOSIS — Z7982 Long term (current) use of aspirin: Secondary | ICD-10-CM | POA: Diagnosis not present

## 2014-02-19 DIAGNOSIS — B952 Enterococcus as the cause of diseases classified elsewhere: Secondary | ICD-10-CM | POA: Diagnosis present

## 2014-02-19 DIAGNOSIS — I350 Nonrheumatic aortic (valve) stenosis: Secondary | ICD-10-CM

## 2014-02-19 DIAGNOSIS — I714 Abdominal aortic aneurysm, without rupture, unspecified: Secondary | ICD-10-CM | POA: Diagnosis present

## 2014-02-19 DIAGNOSIS — Z952 Presence of prosthetic heart valve: Secondary | ICD-10-CM | POA: Diagnosis not present

## 2014-02-19 DIAGNOSIS — I25709 Atherosclerosis of coronary artery bypass graft(s), unspecified, with unspecified angina pectoris: Secondary | ICD-10-CM | POA: Diagnosis present

## 2014-02-19 DIAGNOSIS — I25810 Atherosclerosis of coronary artery bypass graft(s) without angina pectoris: Secondary | ICD-10-CM | POA: Diagnosis not present

## 2014-02-19 DIAGNOSIS — E079 Disorder of thyroid, unspecified: Secondary | ICD-10-CM | POA: Diagnosis present

## 2014-02-19 DIAGNOSIS — I5042 Chronic combined systolic (congestive) and diastolic (congestive) heart failure: Secondary | ICD-10-CM | POA: Diagnosis not present

## 2014-02-19 DIAGNOSIS — E872 Acidosis: Secondary | ICD-10-CM | POA: Diagnosis not present

## 2014-02-19 DIAGNOSIS — K219 Gastro-esophageal reflux disease without esophagitis: Secondary | ICD-10-CM | POA: Diagnosis not present

## 2014-02-19 DIAGNOSIS — I9589 Other hypotension: Secondary | ICD-10-CM

## 2014-02-19 DIAGNOSIS — I959 Hypotension, unspecified: Secondary | ICD-10-CM | POA: Diagnosis present

## 2014-02-19 DIAGNOSIS — E860 Dehydration: Secondary | ICD-10-CM | POA: Diagnosis present

## 2014-02-19 DIAGNOSIS — Z951 Presence of aortocoronary bypass graft: Secondary | ICD-10-CM

## 2014-02-19 DIAGNOSIS — N179 Acute kidney failure, unspecified: Secondary | ICD-10-CM | POA: Diagnosis not present

## 2014-02-19 DIAGNOSIS — R6520 Severe sepsis without septic shock: Secondary | ICD-10-CM | POA: Diagnosis not present

## 2014-02-19 DIAGNOSIS — I10 Essential (primary) hypertension: Secondary | ICD-10-CM | POA: Diagnosis present

## 2014-02-19 DIAGNOSIS — N390 Urinary tract infection, site not specified: Secondary | ICD-10-CM | POA: Diagnosis present

## 2014-02-19 DIAGNOSIS — D649 Anemia, unspecified: Secondary | ICD-10-CM | POA: Diagnosis not present

## 2014-02-19 DIAGNOSIS — I2510 Atherosclerotic heart disease of native coronary artery without angina pectoris: Secondary | ICD-10-CM | POA: Diagnosis present

## 2014-02-19 DIAGNOSIS — F17220 Nicotine dependence, chewing tobacco, uncomplicated: Secondary | ICD-10-CM | POA: Diagnosis not present

## 2014-02-19 DIAGNOSIS — E440 Moderate protein-calorie malnutrition: Secondary | ICD-10-CM | POA: Diagnosis not present

## 2014-02-19 DIAGNOSIS — R1110 Vomiting, unspecified: Secondary | ICD-10-CM

## 2014-02-19 DIAGNOSIS — A419 Sepsis, unspecified organism: Secondary | ICD-10-CM | POA: Diagnosis present

## 2014-02-19 DIAGNOSIS — G9340 Encephalopathy, unspecified: Secondary | ICD-10-CM | POA: Diagnosis not present

## 2014-02-19 DIAGNOSIS — Z8673 Personal history of transient ischemic attack (TIA), and cerebral infarction without residual deficits: Secondary | ICD-10-CM

## 2014-02-19 DIAGNOSIS — D696 Thrombocytopenia, unspecified: Secondary | ICD-10-CM | POA: Diagnosis not present

## 2014-02-19 DIAGNOSIS — N400 Enlarged prostate without lower urinary tract symptoms: Secondary | ICD-10-CM | POA: Diagnosis not present

## 2014-02-19 DIAGNOSIS — Z6827 Body mass index (BMI) 27.0-27.9, adult: Secondary | ICD-10-CM

## 2014-02-19 DIAGNOSIS — I5030 Unspecified diastolic (congestive) heart failure: Secondary | ICD-10-CM | POA: Diagnosis present

## 2014-02-19 DIAGNOSIS — R4182 Altered mental status, unspecified: Secondary | ICD-10-CM | POA: Diagnosis present

## 2014-02-19 DIAGNOSIS — M1990 Unspecified osteoarthritis, unspecified site: Secondary | ICD-10-CM | POA: Diagnosis not present

## 2014-02-19 DIAGNOSIS — E861 Hypovolemia: Secondary | ICD-10-CM | POA: Diagnosis present

## 2014-02-19 DIAGNOSIS — M069 Rheumatoid arthritis, unspecified: Secondary | ICD-10-CM | POA: Diagnosis not present

## 2014-02-19 DIAGNOSIS — N3000 Acute cystitis without hematuria: Secondary | ICD-10-CM

## 2014-02-19 LAB — CBC WITH DIFFERENTIAL/PLATELET
BASOS PCT: 0 % (ref 0–1)
Basophils Absolute: 0 10*3/uL (ref 0.0–0.1)
Eosinophils Absolute: 0 10*3/uL (ref 0.0–0.7)
Eosinophils Relative: 0 % (ref 0–5)
HCT: 41.8 % (ref 39.0–52.0)
Hemoglobin: 13.8 g/dL (ref 13.0–17.0)
LYMPHS PCT: 18 % (ref 12–46)
Lymphs Abs: 3.1 10*3/uL (ref 0.7–4.0)
MCH: 31.2 pg (ref 26.0–34.0)
MCHC: 33 g/dL (ref 30.0–36.0)
MCV: 94.6 fL (ref 78.0–100.0)
MONOS PCT: 9 % (ref 3–12)
Monocytes Absolute: 1.6 10*3/uL — ABNORMAL HIGH (ref 0.1–1.0)
NEUTROS ABS: 12.6 10*3/uL — AB (ref 1.7–7.7)
Neutrophils Relative %: 73 % (ref 43–77)
Platelets: 148 10*3/uL — ABNORMAL LOW (ref 150–400)
RBC: 4.42 MIL/uL (ref 4.22–5.81)
RDW: 16.7 % — ABNORMAL HIGH (ref 11.5–15.5)
WBC: 17.3 10*3/uL — ABNORMAL HIGH (ref 4.0–10.5)

## 2014-02-19 LAB — I-STAT CHEM 8, ED
BUN: 43 mg/dL — AB (ref 6–23)
CALCIUM ION: 1.04 mmol/L — AB (ref 1.13–1.30)
Chloride: 90 mEq/L — ABNORMAL LOW (ref 96–112)
Creatinine, Ser: 3.5 mg/dL — ABNORMAL HIGH (ref 0.50–1.35)
GLUCOSE: 175 mg/dL — AB (ref 70–99)
HCT: 47 % (ref 39.0–52.0)
Hemoglobin: 16 g/dL (ref 13.0–17.0)
Potassium: 3.8 mEq/L (ref 3.7–5.3)
Sodium: 131 mEq/L — ABNORMAL LOW (ref 137–147)
TCO2: 25 mmol/L (ref 0–100)

## 2014-02-19 LAB — COMPREHENSIVE METABOLIC PANEL
ALBUMIN: 2.9 g/dL — AB (ref 3.5–5.2)
ALT: 9 U/L (ref 0–53)
ANION GAP: 18 — AB (ref 5–15)
AST: 27 U/L (ref 0–37)
Alkaline Phosphatase: 61 U/L (ref 39–117)
BILIRUBIN TOTAL: 0.3 mg/dL (ref 0.3–1.2)
BUN: 38 mg/dL — AB (ref 6–23)
CO2: 21 mEq/L (ref 19–32)
CREATININE: 2.41 mg/dL — AB (ref 0.50–1.35)
Calcium: 7.9 mg/dL — ABNORMAL LOW (ref 8.4–10.5)
Chloride: 95 mEq/L — ABNORMAL LOW (ref 96–112)
GFR calc Af Amer: 27 mL/min — ABNORMAL LOW (ref >90)
GFR calc non Af Amer: 23 mL/min — ABNORMAL LOW (ref >90)
GLUCOSE: 89 mg/dL (ref 70–99)
Potassium: 4.3 mEq/L (ref 3.7–5.3)
Sodium: 134 mEq/L — ABNORMAL LOW (ref 137–147)
TOTAL PROTEIN: 6.5 g/dL (ref 6.0–8.3)

## 2014-02-19 LAB — URINE MICROSCOPIC-ADD ON

## 2014-02-19 LAB — URINALYSIS, ROUTINE W REFLEX MICROSCOPIC
GLUCOSE, UA: NEGATIVE mg/dL
Ketones, ur: 15 mg/dL — AB
Nitrite: NEGATIVE
PH: 5 (ref 5.0–8.0)
Protein, ur: NEGATIVE mg/dL
SPECIFIC GRAVITY, URINE: 1.022 (ref 1.005–1.030)
Urobilinogen, UA: 0.2 mg/dL (ref 0.0–1.0)

## 2014-02-19 LAB — I-STAT CG4 LACTIC ACID, ED
LACTIC ACID, VENOUS: 2.3 mmol/L — AB (ref 0.5–2.2)
Lactic Acid, Venous: 6.1 mmol/L — ABNORMAL HIGH (ref 0.5–2.2)

## 2014-02-19 LAB — MRSA PCR SCREENING: MRSA BY PCR: NEGATIVE

## 2014-02-19 LAB — CBG MONITORING, ED: Glucose-Capillary: 158 mg/dL — ABNORMAL HIGH (ref 70–99)

## 2014-02-19 MED ORDER — ACETAMINOPHEN 650 MG RE SUPP: 650.0000 mg | Freq: Four times a day (QID) | RECTAL | Status: DC | PRN

## 2014-02-19 MED ORDER — SODIUM CHLORIDE 0.9 % IV BOLUS (SEPSIS)
2000.0000 mL | Freq: Once | INTRAVENOUS | Status: AC
  Administered 2014-02-19: 2000 mL via INTRAVENOUS

## 2014-02-19 MED ORDER — SODIUM CHLORIDE 0.9 % IJ SOLN
3.0000 mL | Freq: Two times a day (BID) | INTRAMUSCULAR | Status: DC
  Administered 2014-02-19 – 2014-02-21 (×5): 3 mL via INTRAVENOUS

## 2014-02-19 MED ORDER — SIMVASTATIN 20 MG PO TABS
20.0000 mg | ORAL_TABLET | Freq: Every day | ORAL | Status: DC
  Administered 2014-02-19 – 2014-02-21 (×3): 20 mg via ORAL
  Filled 2014-02-19 (×3): qty 1

## 2014-02-19 MED ORDER — ONDANSETRON HCL 4 MG PO TABS: 4.0000 mg | ORAL_TABLET | Freq: Four times a day (QID) | ORAL | Status: DC | PRN

## 2014-02-19 MED ORDER — ASPIRIN EC 81 MG PO TBEC
81.0000 mg | DELAYED_RELEASE_TABLET | Freq: Every day | ORAL | Status: DC
  Administered 2014-02-19 – 2014-02-22 (×4): 81 mg via ORAL
  Filled 2014-02-19 (×5): qty 1

## 2014-02-19 MED ORDER — HEPARIN SODIUM (PORCINE) 5000 UNIT/ML IJ SOLN
5000.0000 [IU] | Freq: Three times a day (TID) | INTRAMUSCULAR | Status: DC
  Administered 2014-02-19 – 2014-02-22 (×9): 5000 [IU] via SUBCUTANEOUS
  Filled 2014-02-19 (×10): qty 1

## 2014-02-19 MED ORDER — PREDNISONE 5 MG PO TABS
5.0000 mg | ORAL_TABLET | ORAL | Status: DC
  Administered 2014-02-19 – 2014-02-21 (×2): 5 mg via ORAL
  Filled 2014-02-19 (×2): qty 1

## 2014-02-19 MED ORDER — PROMETHAZINE HCL 25 MG/ML IJ SOLN
12.5000 mg | Freq: Once | INTRAMUSCULAR | Status: DC
  Filled 2014-02-19: qty 1

## 2014-02-19 MED ORDER — DEXTROSE 5 % IV SOLN
2.0000 g | Freq: Once | INTRAVENOUS | Status: AC
  Administered 2014-02-19: 2 g via INTRAVENOUS
  Filled 2014-02-19: qty 2

## 2014-02-19 MED ORDER — INFLUENZA VAC SPLIT QUAD 0.5 ML IM SUSY
0.5000 mL | PREFILLED_SYRINGE | INTRAMUSCULAR | Status: AC
  Administered 2014-02-21: 0.5 mL via INTRAMUSCULAR
  Filled 2014-02-19 (×2): qty 0.5

## 2014-02-19 MED ORDER — SODIUM CHLORIDE 0.9 % IV SOLN
INTRAVENOUS | Status: DC
  Administered 2014-02-19: 13:00:00 via INTRAVENOUS

## 2014-02-19 MED ORDER — SODIUM CHLORIDE 0.9 % IV BOLUS (SEPSIS)
1000.0000 mL | INTRAVENOUS | Status: AC
  Administered 2014-02-19 (×2): 1000 mL via INTRAVENOUS

## 2014-02-19 MED ORDER — CEFTRIAXONE SODIUM IN DEXTROSE 20 MG/ML IV SOLN
1.0000 g | INTRAVENOUS | Status: DC
  Administered 2014-02-20 – 2014-02-21 (×2): 1 g via INTRAVENOUS
  Filled 2014-02-19 (×5): qty 50

## 2014-02-19 MED ORDER — METHOTREXATE 2.5 MG PO TABS: 25.0000 mg | ORAL_TABLET | ORAL | Status: DC

## 2014-02-19 MED ORDER — FOLIC ACID 1 MG PO TABS
3.0000 mg | ORAL_TABLET | Freq: Every day | ORAL | Status: DC
  Administered 2014-02-19 – 2014-02-22 (×4): 3 mg via ORAL
  Filled 2014-02-19 (×4): qty 3

## 2014-02-19 MED ORDER — SODIUM CHLORIDE 0.9 % IV BOLUS (SEPSIS)
500.0000 mL | INTRAVENOUS | Status: AC
  Administered 2014-02-19: 500 mL via INTRAVENOUS

## 2014-02-19 MED ORDER — LEVOTHYROXINE SODIUM 25 MCG PO TABS
25.0000 ug | ORAL_TABLET | ORAL | Status: DC
  Administered 2014-02-20 – 2014-02-22 (×2): 25 ug via ORAL
  Filled 2014-02-19 (×2): qty 1

## 2014-02-19 MED ORDER — ONDANSETRON HCL 4 MG/2ML IJ SOLN: 4.0000 mg | Freq: Four times a day (QID) | INTRAMUSCULAR | Status: DC | PRN

## 2014-02-19 MED ORDER — ACETAMINOPHEN 325 MG PO TABS: 650.0000 mg | ORAL_TABLET | Freq: Four times a day (QID) | ORAL | Status: DC | PRN

## 2014-02-19 MED ORDER — TRAMADOL HCL 50 MG PO TABS: 50.0000 mg | ORAL_TABLET | Freq: Two times a day (BID) | ORAL | Status: DC | PRN

## 2014-02-19 MED ORDER — PANTOPRAZOLE SODIUM 40 MG PO TBEC
40.0000 mg | DELAYED_RELEASE_TABLET | Freq: Every day | ORAL | Status: DC
  Administered 2014-02-19 – 2014-02-22 (×4): 40 mg via ORAL
  Filled 2014-02-19 (×4): qty 1

## 2014-02-19 NOTE — ED Provider Notes (Signed)
CSN: [id number]     Arrival date & time 02/19/14  0545 History   First MD Initiated Contact with Patient 02/19/14 [phone]     Chief Complaint  Patient presents with  . Altered Mental Status  . Emesis     (Consider location/radiation/quality/duration/timing/severity/associated sxs/prior Treatment) HPI 80 year old male history of coronary artery disease heart failure aortic valve replacement now has several days of gradual onset confusion with multiple episodes of vomiting per day with some dysuria with no cough no shortness of breath no rash no headache no stiff neck; he did fall twice with no injury; he lives at home with family; he has no abdominal pain no diarrhea; there is no treatment prior to arrival. He is generally weak and lightheaded without syncope. Past Medical History  Diagnosis Date  . Hypertension   . GERD (gastroesophageal reflux disease)   . Thyroid disease     HYPOTHYROIDISM  . Aortic stenosis, severe   . Embolism involving retinal artery   . BPH (benign prostatic hypertrophy)     Dr. Retta Diones  . Iliac artery aneurysm     Dr. Edilia Bo  . AAA (abdominal aortic aneurysm)     Dr. Ashley Royalty  . Diastolic heart failure   . 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 Coronary artery disease     Left main disease and severe 3-vessel CAD  . CAD (coronary artery disease) of bypass graft     Chronic occlusion of saphenous vein graft to RCA  . Hypothyroidism   . Shortness of breath   . Heart murmur   . Stroke     mini stroke effective his eye left  . Arthritis   . S/P TAVR (transcatheter aortic valve replacement) 01/15/2013    29 mm Edwards Sapien XT transcatheter heart valve placed via transapical approach   Past Surgical History  Procedure Laterality Date  . Left hip hemiarthropasty      AFTER HIP FX  . Coronary artery bypass graft      1999..Dr. Sheliah Plane  . Total knee  arthroplasty      right 1992   left  2002  . Biopsy prostate      2005  . Prostate surgery  2012    TURP  . Joint replacement  2007    THR  . Harvest bone graft  1982    FOR THE  ANKLE  . Skin grafts      1968  . Colonoscopy    . Toe surgery Bilateral   . Transcatheter aortic valve replacement, transapical N/A 01/15/2013    Procedure: TRANSCATHETER AORTIC VALVE REPLACEMENT, TRANSAPICAL;  Surgeon: [name], MD;  Location: [facility] OR;  Service: Open Heart Surgery;  Laterality: N/A;  . Intraoperative transesophageal echocardiogram N/A 01/15/2013    Procedure: INTRAOPERATIVE TRANSESOPHAGEAL ECHOCARDIOGRAM;  Surgeon: [name], MD;  Location: [facility] OR;  Service: Open Heart Surgery;  Laterality: N/A;  . Carotid endarterectomy  Jan. 2, 1997   Family History  Problem Relation Age of Onset  . Heart attack Mother   . Heart disease Mother     Before age 32  . Hyperlipidemia Mother   . Hypertension Son   . Heart disease Father     AAA  . Hyperlipidemia Father   . Heart attack Father   . Heart disease Brother     Before age 62  .  Hyperlipidemia Brother   . Heart attack Brother    History  Substance Use Topics  . Smoking status: Former Smoker -- 0.50 packs/day for 47 years    Types: Cigarettes    Quit date: 03/28/1958  . Smokeless tobacco: Former Neurosurgeon    Types: Chew     Comment: ONLY USED 2 YEARS  . Alcohol Use: No    Review of Systems 10 Systems reviewed and are negative for acute change except as noted in the HPI.   Allergies  Ciprofloxacin  Home Medications   Prior to Admission medications   Medication Sig Start Date End Date Taking? Authorizing Provider  acetaminophen (TYLENOL) 325 MG tablet Take 2 tablets (650 mg total) by mouth every 6 (six) hours as needed for pain. 01/21/13  Yes Donielle Margaretann Loveless, PA-C  amLODipine (NORVASC) 5 MG tablet Take 5 mg by mouth daily.   Yes Historical Provider, MD  aspirin EC 81 MG EC tablet Take 1 tablet (81 mg total) by mouth  daily. 01/21/13  Yes Donielle Margaretann Loveless, PA-C  diazepam (VALIUM) 5 MG tablet Take 5 mg by mouth daily as needed for anxiety.   Yes Historical Provider, MD  folic acid (FOLVITE) 1 MG tablet Take 3 mg by mouth daily.   Yes Historical Provider, MD  levothyroxine (SYNTHROID, LEVOTHROID) 25 MCG tablet Take 25 mcg by mouth 4 (four) times a week. Monday, Wednesday,Friday, and Saturday   Yes Historical Provider, MD  methotrexate 2.5 MG tablet Take 25 mg by mouth every Saturday.    Yes Historical Provider, MD  metoprolol tartrate (LOPRESSOR) 25 MG tablet Take 1 tablet (25 mg total) by mouth daily. 01/21/13  Yes Donielle Margaretann Loveless, PA-C  mirabegron ER (MYRBETRIQ) 25 MG TB24 tablet Take 25 mg by mouth daily.   Yes Historical Provider, MD  omeprazole (PRILOSEC) 20 MG capsule Take 20 mg by mouth daily.   Yes Historical Provider, MD  predniSONE (DELTASONE) 5 MG tablet Take 5 mg by mouth every other day.    Yes Historical Provider, MD  simvastatin (ZOCOR) 20 MG tablet Take 20 mg by mouth daily at 6 PM.    Yes Historical Provider, MD  traMADol (ULTRAM) 50 MG tablet Take 50 mg by mouth every 6 (six) hours as needed for moderate pain.   Yes Historical Provider, MD   BP 128/59 mmHg  Pulse 69  Temp(Src) 99.2 F (37.3 C) (Oral)  Resp 21  Ht 5\' 4"  (1.626 m)  Wt 159 lb (72.122 kg)  BMI 27.28 kg/m2  SpO2 95% Physical Exam  Constitutional:  Awake, alert, nontoxic appearance.  HENT:  Head: Atraumatic.  Eyes: Right eye exhibits no discharge. Left eye exhibits no discharge.  Neck: Neck supple.  Cardiovascular: Normal rate and regular rhythm.   No murmur heard. Pulmonary/Chest: Effort normal and breath sounds normal. No respiratory distress. He has no wheezes. He has no rales. He exhibits no tenderness.  Pulse oximetry normal room air 97%  Abdominal: Soft. Bowel sounds are normal. He exhibits no distension and no mass. There is no tenderness. There is no rebound and no guarding.  Musculoskeletal: He exhibits  no edema or tenderness.  Baseline ROM, no obvious new focal weakness.  Neurological: He is alert.  Pleasantly confused oriented to person and place but not time. Motor strength 4 out of 5 in all 4 extremities.  Skin: No rash noted.  Psychiatric: He has a normal mood and affect.  Nursing note and vitals reviewed.   ED Course  Procedures (  including critical care time) Level 1 code sepsis initiated after initial examination patient and patient's presentation with altered mentation suspected infection with hypotension with lactate greater than 4. 0720   ED Sepsis - Repeat Assessment   Performed at:    February 19, 2014, 9:31 AM   Last Vitals:    Blood pressure 139/42, pulse 62, temperature 98.5 F (36.9 C), temperature source Rectal, resp. rate 12, height 5\' 4"  (1.626 m), weight 160 lb (72.576 kg), SpO2 99 %.  Heart:      RRR  Lungs:     Few rales right base which improve with deep breath, left base clear  Capillary Refill:   <2secs  Peripheral Pulse (include location): Radial normal   Skin (include color):   Normal for race  CRITICAL CARE Performed by: Total critical care time: Hurman Horn Critical care time was exclusive of separately billable procedures and treating other patients. Critical care was necessary to treat or prevent imminent or life-threatening deterioration. Critical care was time spent personally by me on the following activities: development of treatment plan with patient and/or surrogate as well as nursing, discussions with consultants, evaluation of patient's response to treatment, examination of patient, obtaining history from patient or surrogate, ordering and performing treatments and interventions, ordering and review of laboratory studies, ordering and review of radiographic studies, pulse oximetry and re-evaluation of patient's condition.   D/w [facility] for admit.  Labs Review Labs Reviewed  CBC WITH DIFFERENTIAL - Abnormal; Notable for the  following:    WBC 17.3 (*)    RDW 16.7 (*)    Platelets 148 (*)    Neutro Abs 12.6 (*)    Monocytes Absolute 1.6 (*)    All other components within normal limits  URINALYSIS, ROUTINE W REFLEX MICROSCOPIC - Abnormal; Notable for the following:    Color, Urine AMBER (*)    APPearance CLOUDY (*)    Hgb urine dipstick SMALL (*)    Bilirubin Urine SMALL (*)    Ketones, ur 15 (*)    Leukocytes, UA LARGE (*)    All other components within normal limits  URINE MICROSCOPIC-ADD ON - Abnormal; Notable for the following:    Bacteria, UA FEW (*)    Casts HYALINE CASTS (*)    All other components within normal limits  COMPREHENSIVE METABOLIC PANEL - Abnormal; Notable for the following:    Sodium 134 (*)    Chloride 95 (*)    BUN 38 (*)    Creatinine, Ser 2.41 (*)    Calcium 7.9 (*)    Albumin 2.9 (*)    GFR calc non Af Amer 23 (*)    GFR calc Af Amer 27 (*)    Anion gap 18 (*)    All other components within normal limits  BASIC METABOLIC PANEL - Abnormal; Notable for the following:    Sodium 132 (*)    Calcium 7.5 (*)    GFR calc non Af Amer 65 (*)    GFR calc Af Amer 76 (*)    All other components within normal limits  CBC - Abnormal; Notable for the following:    RBC 3.30 (*)    Hemoglobin 9.6 (*)    HCT 30.9 (*)    RDW 16.7 (*)    Platelets 95 (*)    All other components within normal limits  I-STAT CHEM 8, ED - Abnormal; Notable for the following:    Sodium 131 (*)    Chloride 90 (*)  BUN 43 (*)    Creatinine, Ser 3.50 (*)    Glucose, Bld 175 (*)    Calcium, Ion 1.04 (*)    All other components within normal limits  I-STAT CG4 LACTIC ACID, ED - Abnormal; Notable for the following:    Lactic Acid, Venous 6.10 (*)    All other components within normal limits  CBG MONITORING, ED - Abnormal; Notable for the following:    Glucose-Capillary 158 (*)    All other components within normal limits  I-STAT CG4 LACTIC ACID, ED - Abnormal; Notable for the following:    Lactic  Acid, Venous 2.30 (*)    All other components within normal limits  CULTURE, BLOOD (ROUTINE X 2)  CULTURE, BLOOD (ROUTINE X 2)  URINE CULTURE  MRSA PCR SCREENING  LACTIC ACID, PLASMA  OCCULT BLOOD X 1 CARD TO LAB, STOOL    Imaging Review US Renal  02/19/2014   CLINICAL DATA:  Acute renal failure.  EXAM: RENAL/URINARY TRACT ULTRASOUND COMPLETE  COMPARISON:  CT abdomen and pelvis 12/26/2012.  FINDINGS: Right Kidney:  Length: 10.2 cm. Echogenicity within normal limits. No mass or hydronephrosis visualized.  Left Kidney:  Length: 10.1 cm. Echogenicity within normal limits. No mass or hydronephrosis visualized.  Bladder:  Appears normal for degree of bladder distention.  IMPRESSION: Negative for hydronephrosis.  Negative examination.   Electronically Signed   By: Drusilla Kanner M.D.   On: 02/19/2014 11:09   Dg Chest Portable 1 View  02/19/2014   CLINICAL DATA:  Vomiting.  Cough.  EXAM: PORTABLE CHEST - 1 VIEW  COMPARISON:  02/13/2013.  FINDINGS: Cardiomegaly. Median sternotomy. Trans catheter aortic valve replacement. Basilar atelectasis. No airspace disease. No pleural effusion. Aortic arch atherosclerosis. Monitoring leads project over the chest.  IMPRESSION: Postsurgical changes of the chest and low lung volumes without acute cardiopulmonary disease. Cardiomegaly without failure.   Electronically Signed   By: Andreas Newport M.D.   On: 02/19/2014 07:08     EKG Interpretation   Date/Time:  Wednesday February 19 2014 05:59:00 EST Ventricular Rate:  93 PR Interval:  234 QRS Duration: 144 QT Interval:  409 QTC Calculation: 509 R Axis:   -47 Text Interpretation:  Sinus rhythm Multiple premature complexes, vent   Prolonged PR interval Left bundle branch block Confirmed by [facility]   MD, APRIL ([id number]) on 02/19/2014 6:16:50 AM      MDM   Final diagnoses:  Acute renal failure  Sepsis, due to unspecified organism  Urinary tract infection without hematuria, site unspecified   Hypotension, unspecified hypotension type  Severe sepsis with acute organ dysfunction    The patient appears reasonably stabilized for admission considering the current resources, flow, and capabilities available in the ED at this time, and I doubt any other [facility] requiring further screening and/or treatment in the ED prior to admission.    Hurman Horn, MD 02/20/14 [phone]

## 2014-02-19 NOTE — H&P (Addendum)
[facility] History and Physical  Hunter Choi OZH:[id number] DOB: Jun 26, 1927 DOA: 02/19/2014  Referring physician: Dr Fonnie Jarvis PCP: Darnelle Bos, MD   Chief Complaint:  Nausea and vomiting with AMS  HPI:  80 year old male with history of hypertension, significant coronary artery disease status post CABG, history of stroke, hypothyroidism, severe aortic stenosis s/p TAVR in 2014, rheumatoid arthritis, and GERD who was brought to the ED with 3 day history of ongoing nausea and vomiting and progressive change in mental status since yesterday. As per wife and son at bedside patient had persistent vomiting of food particle followed by greenish liquid for the past 3 days. He was also complaining of burning urination for past several days. No history of fevers or chills, headache, dizziness, chest pain, palpitation, shortness of breath, abdominal pain or diarrhea. Reports poor appetite. No change in weight. No recent travel or sick contacts. No change in medications. At baseline patient ambulates with the help of a walker.  Course in the ED Patient was found to be septic and hypotensive with blood pressure of 73/53 in the ED. He was afebrile, normal heart rate and respiratory rate and normal O2 sat on room air. Patient given a total of 3.5 L normal saline in the ED after which his blood pressure improved to 114/96 mmHg.Marland Kitchen Blood will done showed significant leukocytosis with WBC of 17.3 K, normal hemoglobin and platelets. Chemistry showed sodium of 131, chloride of 90, BUN of 43 and creatinine of 3.5 (normal baseline creatinine). Blood glucose was 175. Lactic acid was elevated to 6, subsequent level of 2.3. EKG and chest x-ray were unremarkable. UA was suggestive of UTI. Blood and urine cultures sent. Patient given a dose of IV Rocephin. Renal ultrasound done showed no urinary obstruction or hydronephrosis. After receiving IV bolus and antibiotic in the ED his mental status started improving  towards baseline. Hospitalist admission requested to stepdown unit.   Review of Systems:  Constitutional: Denies fever, chills, diaphoresis, appetite change and fatigue.  HEENT: Denies blurred vision, hard of hearing, denies ear pain, congestion, sore throat, mouth sores, trouble swallowing, neck pain Respiratory: Denies SOB, DOE, cough, chest tightness,  and wheezing.   Cardiovascular: Denies chest pain, palpitations and leg swelling.  Gastrointestinal: Nausea and vomiting+++, denies abdominal pain, diarrhea, constipation, blood in stool and abdominal distention.  Genitourinary: Dysuria, chronic urgency and frequency, denies hematuria, flank pain or difficulty urinating  Endocrine: Denies polyuria, polydipsia. Musculoskeletal: Denies myalgias, back pain, chronic joint pains Skin: Denies pallor, rash and wound.  Neurological: Reports weakness, Denies dizziness, seizures, syncope, , light-headedness, numbness and headaches.  Psychiatric/Behavioral: Confusion present, denies sleep disturbance or agitation   Past Medical History  Diagnosis Date  . Hypertension   . GERD (gastroesophageal reflux disease)   . Thyroid disease     HYPOTHYROIDISM  . Aortic stenosis, severe   . Embolism involving retinal artery   . BPH (benign prostatic hypertrophy)     Dr. Retta Diones  . Iliac artery aneurysm     Dr. Edilia Bo  . AAA (abdominal aortic aneurysm)     Dr. Ashley Royalty  . Diastolic heart failure   . 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 Coronary artery disease     Left main disease and severe 3-vessel CAD  . CAD (coronary artery disease) of bypass graft     Chronic occlusion of saphenous vein graft  to RCA  . Hypothyroidism   . Shortness of breath   . Heart murmur   . Stroke     mini stroke effective his eye left  . Arthritis   . S/P TAVR (transcatheter aortic valve replacement) 01/15/2013    29 mm  Edwards Sapien XT transcatheter heart valve placed via transapical approach   Past Surgical History  Procedure Laterality Date  . Left hip hemiarthropasty      AFTER HIP FX  . Coronary artery bypass graft      1999..Dr. Sheliah Plane  . Total knee arthroplasty      right 1992   left  2002  . Biopsy prostate      2005  . Prostate surgery  2012    TURP  . Joint replacement  2007    THR  . Harvest bone graft  1982    FOR THE  ANKLE  . Skin grafts      1968  . Colonoscopy    . Toe surgery Bilateral   . Transcatheter aortic valve replacement, transapical N/A 01/15/2013    Procedure: TRANSCATHETER AORTIC VALVE REPLACEMENT, TRANSAPICAL;  Surgeon: [name], MD;  Location: [facility] OR;  Service: Open Heart Surgery;  Laterality: N/A;  . Intraoperative transesophageal echocardiogram N/A 01/15/2013    Procedure: INTRAOPERATIVE TRANSESOPHAGEAL ECHOCARDIOGRAM;  Surgeon: [name], MD;  Location: [facility] OR;  Service: Open Heart Surgery;  Laterality: N/A;  . Carotid endarterectomy  Jan. 2, 1997   Social History:  reports that he quit smoking about 55 years ago. His smoking use included Cigarettes. He has a 23.5 pack-year smoking history. He has quit using smokeless tobacco. His smokeless tobacco use included Chew. He reports that he does not drink alcohol or use illicit drugs.  Allergies  Allergen Reactions  . Ciprofloxacin Other (See Comments)    REACTION: mild delirium    Family History  Problem Relation Age of Onset  . Heart attack Mother   . Heart disease Mother     Before age 23  . Hyperlipidemia Mother   . Hypertension Son   . Heart disease Father     AAA  . Hyperlipidemia Father   . Heart attack Father   . Heart disease Brother     Before age 49  . Hyperlipidemia Brother   . Heart attack Brother     Prior to Admission medications   Medication Sig Start Date End Date Taking? Authorizing Provider  acetaminophen (TYLENOL) 325 MG tablet Take 2 tablets (650 mg total) by  mouth every 6 (six) hours as needed for pain. 01/21/13  Yes Donielle Margaretann Loveless, PA-C  amLODipine (NORVASC) 5 MG tablet Take 5 mg by mouth daily.   Yes Historical Provider, MD  aspirin EC 81 MG EC tablet Take 1 tablet (81 mg total) by mouth daily. 01/21/13  Yes Donielle Margaretann Loveless, PA-C  diazepam (VALIUM) 5 MG tablet Take 5 mg by mouth daily as needed for anxiety.   Yes Historical Provider, MD  folic acid (FOLVITE) 1 MG tablet Take 3 mg by mouth daily.   Yes Historical Provider, MD  levothyroxine (SYNTHROID, LEVOTHROID) 25 MCG tablet Take 25 mcg by mouth 4 (four) times a week. Monday, Wednesday,Friday, and Saturday   Yes Historical Provider, MD  methotrexate 2.5 MG tablet Take 25 mg by mouth every Saturday.    Yes Historical Provider, MD  metoprolol tartrate (LOPRESSOR) 25 MG tablet Take 1 tablet (25 mg total) by mouth daily. 01/21/13  Yes Donielle M  Joycelyn Man, PA-C  mirabegron ER (MYRBETRIQ) 25 MG TB24 tablet Take 25 mg by mouth daily.   Yes Historical Provider, MD  omeprazole (PRILOSEC) 20 MG capsule Take 20 mg by mouth daily.   Yes Historical Provider, MD  predniSONE (DELTASONE) 5 MG tablet Take 5 mg by mouth every other day.    Yes Historical Provider, MD  simvastatin (ZOCOR) 20 MG tablet Take 20 mg by mouth daily at 6 PM.    Yes Historical Provider, MD  traMADol (ULTRAM) 50 MG tablet Take 50 mg by mouth every 6 (six) hours as needed for moderate pain.   Yes Historical Provider, MD     Physical Exam:  Filed Vitals:   02/19/14 0930 02/19/14 1000 02/19/14 1030 02/19/14 1045  BP: 118/53 114/44 110/49 114/96  Pulse: 70 69 69 75  Temp:      TempSrc:      Resp:  12 12 16   Height:      Weight:      SpO2: 99% 97% 97% 93%    Constitutional: Vital signs reviewed.  Elderly male lying in bed in no acute distress, having episodic hiccups HEENT: No pallor, no icterus, dry oral mucosa, no cervical lymphadenopathy Cardiovascular: RRR, S1 normal, S2 normal, 3/6 systolic murmur, no rubs or  gallop Chest: CTAB, no wheezes, rales, or rhonchi Abdominal: Soft. Non-tender, non-distended, bowel sounds are normal, GU: no CVA tenderness Ext: warm, no edema, and hand deformity with  arthritis  Neurological: Alert and oriented, hard of hearing, nonfocal  Labs on Admission:  Basic Metabolic Panel:  Recent Labs Lab 02/19/14 0642  NA 131*  K 3.8  CL 90*  GLUCOSE 175*  BUN 43*  CREATININE 3.50*   Liver Function Tests: No results for input(s): AST, ALT, ALKPHOS, BILITOT, PROT, ALBUMIN in the last 168 hours. No results for input(s): LIPASE, AMYLASE in the last 168 hours. No results for input(s): AMMONIA in the last 168 hours. CBC:  Recent Labs Lab 02/19/14 0619 02/19/14 0642  WBC 17.3*  --   NEUTROABS 12.6*  --   HGB 13.8 16.0  HCT 41.8 47.0  MCV 94.6  --   PLT 148*  --    Cardiac Enzymes: No results for input(s): CKTOTAL, CKMB, CKMBINDEX, TROPONINI in the last 168 hours. BNP: Invalid input(s): POCBNP CBG:  Recent Labs Lab 02/19/14 0619  GLUCAP 158*    Radiological Exams on Admission: US Renal  02/19/2014   CLINICAL DATA:  Acute renal failure.  EXAM: RENAL/URINARY TRACT ULTRASOUND COMPLETE  COMPARISON:  CT abdomen and pelvis 12/26/2012.  FINDINGS: Right Kidney:  Length: 10.2 cm. Echogenicity within normal limits. No mass or hydronephrosis visualized.  Left Kidney:  Length: 10.1 cm. Echogenicity within normal limits. No mass or hydronephrosis visualized.  Bladder:  Appears normal for degree of bladder distention.  IMPRESSION: Negative for hydronephrosis.  Negative examination.   Electronically Signed   By: Drusilla Kanner M.D.   On: 02/19/2014 11:09   Dg Chest Portable 1 View  02/19/2014   CLINICAL DATA:  Vomiting.  Cough.  EXAM: PORTABLE CHEST - 1 VIEW  COMPARISON:  02/13/2013.  FINDINGS: Cardiomegaly. Median sternotomy. Trans catheter aortic valve replacement. Basilar atelectasis. No airspace disease. No pleural effusion. Aortic arch atherosclerosis. Monitoring  leads project over the chest.  IMPRESSION: Postsurgical changes of the chest and low lung volumes without acute cardiopulmonary disease. Cardiomegaly without failure.   Electronically Signed   By: Andreas Newport M.D.   On: 02/19/2014 07:08    EKG: NSR@93 , multiple  PVCs  Assessment/Plan  Principal Problem:   Severe sepsis Likely secondary to UTI. Admit to stepdown unit for closer monitoring.  -Patient received 2.5 L IV normal saline bolus in the ED. Blood pressures slowly starting to improve. Will place on maintenance IV normal saline at 100 mL per hour. Monitor strict I/O.  -Empiric IV Rocephin. Follow blood culture and urine culture. Repeat lactic acid level in a.m. -Supportive care with Tylenol and antiemetics.  Active Problems:   Acute kidney injury Prerenal secondary to severe dehydration and UTI. Denies use of NSAIDs.. Monitor with IV hydration. Avoid nephrotoxins. Hold methotrexate. Ultrasound abdomen negative for any obstruction or hydronephrosis. Monitor strict I/O  UTI Follow urine culture. Continue empiric Rocephin  Nausea and vomiting Secondary to UTI with sepsis. Improving and has not had further symptoms since arrival to the ED. Reports being hungry and wants to eat. Will place him on clear liquids.  Acute encephalopathy Secondary to severe sepsis. Now improving. Hold Valium, reduce tramadol to twice daily. Monitoring stepdown  Lactic acidosis Elevated lactate on admission with anion gap of 18. Monitor with IV hydration. Repeat lactate in a.m.    Rheumatoid arthritis On chronic low-dose prednisone and methotrexate. Hold methotrexate.    Thyroid disease Continue Synthroid    Aortic stenosis, severe, s/p TAVR in 2014 Follows with Dr. Lavinia Sharps.    BPH (benign prostatic hypertrophy) Follows with Dr. Janifer Adie. contine mybertiq.     Diastolic heart failure Currently hypovolemic. Hold blood pressure medications  Hypertension Hold blood pressure medications      CAD (coronary artery disease) of bypass graft Follows with cardiology as outpatient. Continue aspirin and statin. Hold beta blocker    AAA (abdominal aortic aneurysm) without rupture   Follows with vascular surgery as outpatient.  History of stroke Continue aspirin and statin   Diet: Clear liquids  DVT prophylaxis: Subcutaneous heparin   Code Status: full code Family Communication: discussed with patient his wife and son at bedside Disposition Plan: Admit to stepdown unit. Home once stable  Muntaha Vermette [facility] Pager [phone]  Total time spent on admission :70 minutes  If 7PM-7AM, please contact night-coverage [email or web address] Password [facility] 02/19/2014, 11:37 AM   General he's made garden

## 2014-02-19 NOTE — ED Notes (Addendum)
Lactic acid results given to Dr. Terressa Koyanagi

## 2014-02-19 NOTE — ED Notes (Signed)
Pt back from radiology 

## 2014-02-19 NOTE — Progress Notes (Signed)
Utilization Review Completed.  

## 2014-02-19 NOTE — Evaluation (Signed)
Physical Therapy Evaluation Patient Details Name: Hunter Choi MRN: [id number] DOB: July 19, 1927 Today's Date: 02/19/2014   History of Present Illness  80 year old male with history of hypertension, significant coronary artery disease status post CABG, history of stroke, hypothyroidism, severe aortic stenosis s/p TAVR in 2014, rheumatoid arthritis, and GERD who was brought to the ED with 3 day history of ongoing nausea and vomiting and progressive change in mental status since yesterday.  Found to be severely dehydrated and with UTI/sepsis  Clinical Impression  Patient presents with decreased independence with mobility due to deficits listed in PT problem list.  He will benefit from skilled PT in the acute setting to allow return home with family assist and HHPT or to SNF depending on progress and level of assist family can provide.    Follow Up Recommendations Home health PT;SNF    Equipment Recommendations  None recommended by PT    Recommendations for Other Services       Precautions / Restrictions Precautions Precautions: Fall      Mobility  Bed Mobility Overal bed mobility: Needs Assistance Bed Mobility: Sit to Supine;Supine to Sit     Supine to sit: Mod assist Sit to supine: Min assist   General bed mobility comments: mod assist to lift trunk, able to lift feet into bed and scoot up to head of bed with very little help  Transfers Overall transfer level: Needs assistance Equipment used: Rolling walker (2 wheeled) Transfers: Sit to/from [employer] Sit to Stand: Min assist Stand pivot transfers: Min assist       General transfer comment: assist with walker to pivot to [facility], pt incontinent of loose stool on way to [facility]  Ambulation/Gait             General Gait Details: ambulation deferred due to fecal incontinence  Stairs            Wheelchair Mobility    Modified Rankin (Stroke Patients Only)       Balance Overall balance  assessment: Needs assistance   Sitting balance-Leahy Scale: Fair Sitting balance - Comments: mild posterior bias     Standing balance-Leahy Scale: Poor Standing balance comment: needs UE support for balance                             Pertinent Vitals/Pain Pain Assessment: No/denies pain    Home Living Family/patient expects to be discharged to:: Private residence Living Arrangements: Spouse/significant other Available Help at Discharge: Family Type of Home: House Home Access: Stairs to enter   Secretary/administrator of Steps: 3 Home Layout: One level Home Equipment: Environmental consultant - 4 wheels      Prior Function Level of Independence: Independent with assistive device(s)               Hand Dominance        Extremity/Trunk Assessment   Upper Extremity Assessment: RUE deficits/detail;LUE deficits/detail RUE Deficits / Details: limited UE mobility due to RA (especially in hands, but has functional grasp)     LUE Deficits / Details: limited UE mobility due to RA (especially in hands, but has functional grasp)   Lower Extremity Assessment: Generalized weakness         Communication   Communication: HOH  Cognition Arousal/Alertness: Awake/alert Behavior During Therapy: WFL for tasks assessed/performed Overall Cognitive Status: No family/caregiver present to determine baseline cognitive functioning  General Comments      Exercises        Assessment/Plan    PT Assessment Patient needs continued PT services  PT Diagnosis Difficulty walking;Generalized weakness   PT Problem List Decreased strength;Decreased activity tolerance;Decreased balance;Decreased mobility;Decreased range of motion  PT Treatment Interventions DME instruction;Gait training;Functional mobility training;Therapeutic activities;Therapeutic exercise;Patient/family education;Balance training   PT Goals (Current goals can be found in the Care Plan section)  Acute Rehab PT Goals Patient Stated Goal: To get stronger, return to independent PT Goal Formulation: With patient Time For Goal Achievement: 03/05/14 Potential to Achieve Goals: Good    Frequency Min 3X/week   Barriers to discharge        Co-evaluation               End of Session Equipment Utilized During Treatment: Gait belt Activity Tolerance: Patient limited by fatigue (and due to incontinence) Patient left: in bed;with call bell/phone within reach;with bed alarm set Nurse Communication: Mobility status         Time: 4196-2229 PT Time Calculation (min) (ACUTE ONLY): 34 min   Charges:   PT Evaluation $Initial PT Evaluation Tier I: 1 Procedure PT Treatments $Therapeutic Activity: 23-37 mins   PT G Codes:          Udell Mazzocco,CYNDI 2014-03-19, 5:05 PM Sheran Lawless, PT [phone] 2014/03/19

## 2014-02-19 NOTE — ED Notes (Addendum)
IV infiltrated. 2 L NS did not infuse. Fluids removed. New IV placed by [facility] and 2 L NS started.

## 2014-02-19 NOTE — Plan of Care (Signed)
Problem: Phase I Progression Outcomes Goal: Adequate I & O Outcome: Progressing     

## 2014-02-19 NOTE — ED Notes (Signed)
Wife states pt was found at home vomiting on self while laying in bed. Wife states pt has been weak/shaky x 7 days and has been vomiting x 1 day. Wife also states pt has been disoriented and unable to remember things.

## 2014-02-19 NOTE — Plan of Care (Signed)
Problem: Phase I Progression Outcomes Goal: Voiding-avoid urinary catheter unless indicated Outcome: Progressing     

## 2014-02-20 DIAGNOSIS — A419 Sepsis, unspecified organism: Principal | ICD-10-CM

## 2014-02-20 DIAGNOSIS — N400 Enlarged prostate without lower urinary tract symptoms: Secondary | ICD-10-CM

## 2014-02-20 DIAGNOSIS — N390 Urinary tract infection, site not specified: Secondary | ICD-10-CM

## 2014-02-20 DIAGNOSIS — I714 Abdominal aortic aneurysm, without rupture: Secondary | ICD-10-CM

## 2014-02-20 DIAGNOSIS — N179 Acute kidney failure, unspecified: Secondary | ICD-10-CM

## 2014-02-20 DIAGNOSIS — R6520 Severe sepsis without septic shock: Secondary | ICD-10-CM

## 2014-02-20 DIAGNOSIS — E079 Disorder of thyroid, unspecified: Secondary | ICD-10-CM

## 2014-02-20 DIAGNOSIS — I5042 Chronic combined systolic (congestive) and diastolic (congestive) heart failure: Secondary | ICD-10-CM | POA: Diagnosis present

## 2014-02-20 DIAGNOSIS — M069 Rheumatoid arthritis, unspecified: Secondary | ICD-10-CM

## 2014-02-20 DIAGNOSIS — I959 Hypotension, unspecified: Secondary | ICD-10-CM | POA: Diagnosis present

## 2014-02-20 DIAGNOSIS — I350 Nonrheumatic aortic (valve) stenosis: Secondary | ICD-10-CM

## 2014-02-20 LAB — BASIC METABOLIC PANEL
Anion gap: 10 (ref 5–15)
BUN: 23 mg/dL (ref 6–23)
CALCIUM: 7.5 mg/dL — AB (ref 8.4–10.5)
CO2: 22 meq/L (ref 19–32)
CREATININE: 1.02 mg/dL (ref 0.50–1.35)
Chloride: 100 mEq/L (ref 96–112)
GFR calc Af Amer: 76 mL/min — ABNORMAL LOW (ref >90)
GFR calc non Af Amer: 65 mL/min — ABNORMAL LOW (ref >90)
GLUCOSE: 82 mg/dL (ref 70–99)
Potassium: 4.2 mEq/L (ref 3.7–5.3)
Sodium: 132 mEq/L — ABNORMAL LOW (ref 137–147)

## 2014-02-20 LAB — CBC
HEMATOCRIT: 30.9 % — AB (ref 39.0–52.0)
Hemoglobin: 9.6 g/dL — ABNORMAL LOW (ref 13.0–17.0)
MCH: 29.1 pg (ref 26.0–34.0)
MCHC: 31.1 g/dL (ref 30.0–36.0)
MCV: 93.6 fL (ref 78.0–100.0)
Platelets: 95 10*3/uL — ABNORMAL LOW (ref 150–400)
RBC: 3.3 MIL/uL — ABNORMAL LOW (ref 4.22–5.81)
RDW: 16.7 % — AB (ref 11.5–15.5)
WBC: 7.1 10*3/uL (ref 4.0–10.5)

## 2014-02-20 LAB — LACTIC ACID, PLASMA: Lactic Acid, Venous: 0.7 mmol/L (ref 0.5–2.2)

## 2014-02-20 NOTE — Progress Notes (Signed)
Export TEAM 1 - Stepdown/ICU TEAM Progress Note  Hunter Choi DTP:[id number] DOB: [birth year] DOA: 02/19/2014 PCP: Darnelle Bos, MD  Admit HPI / Brief Narrative: 80 year old WM PMHx hypertension, significant coronary artery disease status post CABG, history of stroke, hypothyroidism, severe aortic stenosis s/p TAVR in 2014, rheumatoid arthritis, and GERD who was brought to the ED with 3 day history of ongoing nausea and vomiting and progressive change in mental status since yesterday. As per wife and son at bedside patient had persistent vomiting of food particle followed by greenish liquid for the past 3 days. He was also complaining of burning urination for past several days. No history of fevers or chills, headache, dizziness, chest pain, palpitation, shortness of breath, abdominal pain or diarrhea. Reports poor appetite. No change in weight. No recent travel or sick contacts. No change in medications. At baseline patient ambulates with the help of a walker.  Course in the ED Patient was found to be septic and hypotensive with blood pressure of 73/53 in the ED. He was afebrile, normal heart rate and respiratory rate and normal O2 sat on room air. Patient given a total of 3.5 L normal saline in the ED after which his blood pressure improved to 114/96 mmHg.Marland Kitchen Blood will done showed significant leukocytosis with WBC of 17.3 K, normal hemoglobin and platelets. Chemistry showed sodium of 131, chloride of 90, BUN of 43 and creatinine of 3.5 (normal baseline creatinine). Blood glucose was 175. Lactic acid was elevated to 6, subsequent level of 2.3. EKG and chest x-ray were unremarkable. UA was suggestive of UTI. Blood and urine cultures sent. Patient given a dose of IV Rocephin. Renal ultrasound done showed no urinary obstruction or hydronephrosis. After receiving IV bolus and antibiotic in the ED his mental status started improving towards baseline. Hospitalist admission requested to stepdown  unit.    HPI/Subjective: 11/26 A/O 4, negative CP, negative N/V, states ate all of his breakfast this a.m. without difficulty.  Assessment/Plan:  Severe sepsis -Likely secondary to UTI. Continue current antibiotics.   -Patient received 2.5 L IV normal saline bolus in the ED. Blood pressures improved.  -Continue normal saline at 67ml/hr; monitor closely for fluid overload   Acute kidney injury -Resolved with hydration.  -Hold all renal toxic medication  -Ultrasound abdomen negative for any obstruction or hydronephrosis. -Monitor strict I/O; since admission + 5.1 L -Daily a.m. Weight; admission weight =72.1 kg;   UTI -Urine culture pending, continue current antibiotic regimen   Nausea and vomiting -Resolved, patient consumed breakfast and lunch without further symptoms.   Acute encephalopathy -Resolved, patient A/O 4,. -Continue to hold sedating medication ( Valium, reduce tramadol to twice daily).  Lactic acidosis -Resolved  -AG resolved    Rheumatoid arthritis -Will continue patient on prednisone 5 mg, but hold methotrexate.    Thyroid disease -Continue Synthroid 25 g Sun/T/Th/Sat   Aortic stenosis, severe, s/p TAVR in 2014 -Follows with Dr. Lavinia Sharps.   BPH (benign prostatic hypertrophy) -Follows with Dr. Janifer Adie. contine mybertiq.   Chronic systolic and diastolic CHF -Continue to hold BP medications, as current BP is low for a 80 year-old. -Continue normal saline at 75 ml/hr; monitor closely for fluid overload  Hypertension -Hold blood pressure medications, would allow permissive HTN secondary to patient's age    CAD (coronary artery disease) of bypass graft -Follows with cardiology as outpatient. Continue aspirin and statin. Hold beta blocker   AAA (abdominal aortic aneurysm) without rupture  -Follows with vascular surgery as outpatient.  History  of stroke -Continue aspirin and statin -Obtain lipid panel     Code Status: FULL Family  Communication: no family present at time of exam Disposition Plan: DC 24-48 hours    Consultants: NA  Procedure/Significant Events: 10/19 echocardiogram;- LVEF= 40% to 45%. Diffuse hypokinesis. Hypokinesis of the apical anteroseptal and inferior myocardium. -(grade 1 diastolic dysfunction).-  Aortic valve: A bioprosthesis present, functioning properly.    Culture 11/25 urine pending 11/25 blood pending 11/25 MRSA by PCR negative   Antibiotics: Ceftriaxone 11/25>>   DVT prophylaxis: Subcutaneous heparin   Devices    LINES / TUBES:      Continuous Infusions: . sodium chloride 100 mL/hr at 02/19/14 1307    Objective: VITAL SIGNS: Temp: 99 F (37.2 C) (11/26 0751) Temp Source: Oral (11/26 0751) BP: 124/50 mmHg (11/26 0751) Pulse Rate: 66 (11/26 0751) SPO2; FIO2:   Intake/Output Summary (Last 24 hours) at 02/20/14 1021 Last data filed at 02/20/14 0918  Gross per 24 hour  Intake   1946 ml  Output    700 ml  Net   1246 ml     Exam: General: A/O 4, NAD, No acute respiratory distress Lungs: Clear to auscultation bilaterally without wheezes or crackles Cardiovascular: Regular rate and rhythm without murmur gallop or rub normal S1 and S2 Abdomen: Nontender, nondistended, soft, bowel sounds positive, no rebound, no ascites, no appreciable mass Extremities: No significant cyanosis, clubbing, or edema bilateral lower extremities  Data Reviewed: Basic Metabolic Panel:  Recent Labs Lab 02/19/14 0642 02/19/14 1025 02/20/14 0306  NA 131* 134* 132*  K 3.8 4.3 4.2  CL 90* 95* 100  CO2  --  21 22  GLUCOSE 175* 89 82  BUN 43* 38* 23  CREATININE 3.50* 2.41* 1.02  CALCIUM  --  7.9* 7.5*   Liver Function Tests:  Recent Labs Lab 02/19/14 1025  AST 27  ALT 9  ALKPHOS 61  BILITOT 0.3  PROT 6.5  ALBUMIN 2.9*   No results for input(s): LIPASE, AMYLASE in the last 168 hours. No results for input(s): AMMONIA in the last 168 hours. CBC:  Recent  Labs Lab 02/19/14 0619 02/19/14 0642 02/20/14 0306  WBC 17.3*  --  7.1  NEUTROABS 12.6*  --   --   HGB 13.8 16.0 9.6*  HCT 41.8 47.0 30.9*  MCV 94.6  --  93.6  PLT 148*  --  95*   Cardiac Enzymes: No results for input(s): CKTOTAL, CKMB, CKMBINDEX, TROPONINI in the last 168 hours. BNP (last 3 results) No results for input(s): PROBNP in the last 8760 hours. CBG:  Recent Labs Lab 02/19/14 0619  GLUCAP 158*    Recent Results (from the past 240 hour(s))  MRSA PCR Screening     Status: None   Collection Time: 02/19/14  2:09 PM  Result Value Ref Range Status   MRSA by PCR NEGATIVE NEGATIVE Final    Comment:        The GeneXpert MRSA Assay (FDA approved for NASAL specimens only), is one component of a comprehensive MRSA colonization surveillance program. It is not intended to diagnose MRSA infection nor to guide or monitor treatment for MRSA infections.      Studies:  Recent x-ray studies have been reviewed in detail by the Attending Physician  Scheduled Meds:  Scheduled Meds: . aspirin EC  81 mg Oral Daily  . cefTRIAXone (ROCEPHIN)  IV  1 g Intravenous Q24H  . folic acid  3 mg Oral Daily  . heparin  5,000  Units Subcutaneous 3 times per day  . Influenza vac split quadrivalent PF  0.5 mL Intramuscular Tomorrow-1000  . levothyroxine  25 mcg Oral Once per day on Sun Tue Thu Sat  . pantoprazole  40 mg Oral Daily  . predniSONE  5 mg Oral QODAY  . promethazine  12.5 mg Intravenous Once  . simvastatin  20 mg Oral q1800  . sodium chloride  3 mL Intravenous Q12H    Time spent on care of this patient: 40 mins   Drema Dallas , MD   [facility] Office  [phone] Pager [phone]  On-Call/Text Page:      [email or web address]      password [email or web address]  If 7PM-7AM, please contact night-coverage [email or web address] Password [email or web address] 02/20/2014, 10:21 AM   LOS: 1 day

## 2014-02-20 NOTE — Progress Notes (Signed)
Lab noted a drop in pt's HGB from previous day.  CBC 02/19/2014 HGB was 13.8 and this morning was 9.6.  Pt has no signs of obvious bleeding, vss (98.1 ,70, 18, 133/55, 95%RA). Pt received 3.5L of bolus fluid in the ED and IVF has been NS@100  overnight. Notified Donnamarie Poag NP of above and received an order to obtain an occult stool sample.  Attempted to collect, but pt says that he usually has a bm after breakfast and is fairly sure he will be able to provide a sample at that time.  He says that he had a bm yesterday and the day before and adamantly denies any frank blood, red streaks, black, tarry or sticky stools. Will continue to monitor.

## 2014-02-20 NOTE — Progress Notes (Signed)
Patient is being transferred to 3W 06.  Son, Deniece Portela is at bedside and updated him of this transfer.  Report given to Clenton Pare, Charity fundraiser.

## 2014-02-20 NOTE — Plan of Care (Signed)
Problem: Phase I Progression Outcomes Goal: Pain controlled with appropriate interventions Outcome: Completed/Met Date Met:  02/20/14 Goal: OOB as tolerated unless otherwise ordered Outcome: Progressing Goal: Voiding-avoid urinary catheter unless indicated Outcome: Completed/Met Date Met:  02/20/14 Goal: Tolerating diet Outcome: Completed/Met Date Met:  02/20/14

## 2014-02-21 DIAGNOSIS — E440 Moderate protein-calorie malnutrition: Secondary | ICD-10-CM | POA: Diagnosis present

## 2014-02-21 DIAGNOSIS — A419 Sepsis, unspecified organism: Secondary | ICD-10-CM | POA: Diagnosis not present

## 2014-02-21 DIAGNOSIS — D696 Thrombocytopenia, unspecified: Secondary | ICD-10-CM | POA: Diagnosis not present

## 2014-02-21 LAB — CBC WITH DIFFERENTIAL/PLATELET
BASOS ABS: 0 10*3/uL (ref 0.0–0.1)
BASOS PCT: 0 % (ref 0–1)
Eosinophils Absolute: 0.1 10*3/uL (ref 0.0–0.7)
Eosinophils Relative: 2 % (ref 0–5)
HCT: 29.7 % — ABNORMAL LOW (ref 39.0–52.0)
HEMOGLOBIN: 9.5 g/dL — AB (ref 13.0–17.0)
Lymphocytes Relative: 34 % (ref 12–46)
Lymphs Abs: 2.2 10*3/uL (ref 0.7–4.0)
MCH: 30.6 pg (ref 26.0–34.0)
MCHC: 32 g/dL (ref 30.0–36.0)
MCV: 95.8 fL (ref 78.0–100.0)
MONOS PCT: 10 % (ref 3–12)
Monocytes Absolute: 0.7 10*3/uL (ref 0.1–1.0)
NEUTROS ABS: 3.5 10*3/uL (ref 1.7–7.7)
NEUTROS PCT: 54 % (ref 43–77)
PLATELETS: 101 10*3/uL — AB (ref 150–400)
RBC: 3.1 MIL/uL — ABNORMAL LOW (ref 4.22–5.81)
RDW: 16.7 % — ABNORMAL HIGH (ref 11.5–15.5)
WBC: 6.5 10*3/uL (ref 4.0–10.5)

## 2014-02-21 LAB — COMPREHENSIVE METABOLIC PANEL
ALK PHOS: 53 U/L (ref 39–117)
ALT: 9 U/L (ref 0–53)
AST: 22 U/L (ref 0–37)
Albumin: 2.4 g/dL — ABNORMAL LOW (ref 3.5–5.2)
Anion gap: 11 (ref 5–15)
BUN: 10 mg/dL (ref 6–23)
CO2: 23 mEq/L (ref 19–32)
Calcium: 7.5 mg/dL — ABNORMAL LOW (ref 8.4–10.5)
Chloride: 100 mEq/L (ref 96–112)
Creatinine, Ser: 0.71 mg/dL (ref 0.50–1.35)
GFR, EST NON AFRICAN AMERICAN: 84 mL/min — AB (ref >90)
Glucose, Bld: 89 mg/dL (ref 70–99)
Potassium: 4.2 mEq/L (ref 3.7–5.3)
Sodium: 134 mEq/L — ABNORMAL LOW (ref 137–147)
TOTAL PROTEIN: 5.5 g/dL — AB (ref 6.0–8.3)
Total Bilirubin: 0.4 mg/dL (ref 0.3–1.2)

## 2014-02-21 LAB — LIPID PANEL
CHOLESTEROL: 84 mg/dL (ref 0–200)
HDL: 27 mg/dL — ABNORMAL LOW (ref >39)
LDL Cholesterol: 38 mg/dL (ref 0–99)
TRIGLYCERIDES: 93 mg/dL (ref <150)
Total CHOL/HDL Ratio: 3.1 RATIO
VLDL: 19 mg/dL (ref 0–40)

## 2014-02-21 LAB — MAGNESIUM: MAGNESIUM: 1.5 mg/dL (ref 1.5–2.5)

## 2014-02-21 MED ORDER — FUROSEMIDE 10 MG/ML IJ SOLN
20.0000 mg | Freq: Once | INTRAMUSCULAR | Status: AC
  Administered 2014-02-21: 20 mg via INTRAVENOUS
  Filled 2014-02-21: qty 2

## 2014-02-21 MED ORDER — ENSURE COMPLETE PO LIQD
237.0000 mL | Freq: Two times a day (BID) | ORAL | Status: DC
  Administered 2014-02-21: 237 mL via ORAL

## 2014-02-21 NOTE — Progress Notes (Signed)
INITIAL NUTRITION ASSESSMENT  DOCUMENTATION CODES Per approved criteria  -Non-severe (moderate) malnutrition in the context of acute illness or injury   Pt meets criteria for moderate MALNUTRITION in the context of acute illness as evidenced by mild fat and muscle depletion, energy intake <75% x 7 days.  INTERVENTION: Ensure Complete po BID, each supplement provides 350 kcal and 13 grams of protein  NUTRITION DIAGNOSIS: Inadequate oral intake related to altered GI function as evidenced by n/v PTA, poor intake PTA.   Goal: Pt will meet >90% of estimated nutritional needs  Monitor:  PO/supplement intake, labs, weight changes, I/O's  Reason for Assessment: Consult to assess needs  80 y.o. male  Admitting Dx: Severe sepsis  80 year old male with history of hypertension, significant coronary artery disease status post CABG, history of stroke, hypothyroidism, severe aortic stenosis s/p TAVR in 2014, rheumatoid arthritis, and GERD who was brought to the ED with 3 day history of ongoing nausea and vomiting and progressive change in mental status since one day prior to admission.  ASSESSMENT: Pt reports poor appetite 4-5 days PTA. However, he reports good appetite at baseline.  Documented meal completion reveals 75%. He reveals he ate well today and denies problems chewing and swallowing despite missing teeth. He reports he did not eat well before today, due to being on a clear liquid diet.  No weight changes significant for time frame. Pt reports weight loss has been intentional.  Educated pt on importance of good PO intake to promote healing. He does not use supplements at home, but is agreeable due to hx of poor intake.  Labs reviewed. Na: 134, Calcium: 7.5, CBGS: 158.   Nutrition Focused Physical Exam:  Subcutaneous Fat:  Orbital Region: mild depletion Upper Arm Region: mild depletion Thoracic and Lumbar Region: WDL  Muscle:  Temple Region: WDL Clavicle Bone Region: mild  depletion Clavicle and Acromion Bone Region: WDL Scapular Bone Region: WDL Dorsal Hand: WDL Patellar Region: mild depletion Anterior Thigh Region: WDL Posterior Calf Region: WDL  Edema: none present  Height: Ht Readings from Last 1 Encounters:  02/19/14 5\' 4"  (1.626 m)    Weight: Wt Readings from Last 1 Encounters:  02/21/14 165 lb 11.2 oz (75.161 kg)    Ideal Body Weight: 130#  % Ideal Body Weight: 127%  Wt Readings from Last 10 Encounters:  02/21/14 165 lb 11.2 oz (75.161 kg)  01/15/14 161 lb (73.029 kg)  09/04/13 168 lb (76.204 kg)  07/17/13 173 lb (78.472 kg)  06/13/13 176 lb (79.833 kg)  03/06/13 187 lb (84.823 kg)  02/27/13 183 lb (83.008 kg)  02/13/13 188 lb 8 oz (85.503 kg)  01/22/13 185 lb 3 oz (84 kg)  01/11/13 184 lb 9.6 oz (83.734 kg)    Usual Body Weight: 185#  % Usual Body Weight: 89%  BMI:  Body mass index is 28.43 kg/(m^2). Overweight  Estimated Nutritional Needs: Kcal: 1800-2000 Protein: 90-100 grams Fluid: 1.8-2.0 L  Skin: Intact  Diet Order: Diet Heart  EDUCATION NEEDS: -Education needs addressed   Intake/Output Summary (Last 24 hours) at 02/21/14 1327 Last data filed at 02/21/14 1240  Gross per 24 hour  Intake    450 ml  Output   1800 ml  Net  -1350 ml    Last BM: 02/19/14  Labs:   Recent Labs Lab 02/19/14 1025 02/20/14 0306 02/21/14 0210  NA 134* 132* 134*  K 4.3 4.2 4.2  CL 95* 100 100  CO2 21 22 23   BUN 38* 23 10  CREATININE 2.41* 1.02 0.71  CALCIUM 7.9* 7.5* 7.5*  MG  --   --  1.5  GLUCOSE 89 82 89    CBG (last 3)   Recent Labs  02/19/14 0619  GLUCAP 158*    Scheduled Meds: . aspirin EC  81 mg Oral Daily  . cefTRIAXone (ROCEPHIN)  IV  1 g Intravenous Q24H  . folic acid  3 mg Oral Daily  . heparin  5,000 Units Subcutaneous 3 times per day  . Influenza vac split quadrivalent PF  0.5 mL Intramuscular Tomorrow-1000  . levothyroxine  25 mcg Oral Once per day on Sun Tue Thu Sat  . pantoprazole  40 mg  Oral Daily  . predniSONE  5 mg Oral QODAY  . promethazine  12.5 mg Intravenous Once  . simvastatin  20 mg Oral q1800  . sodium chloride  3 mL Intravenous Q12H    Continuous Infusions:   Past Medical History  Diagnosis Date  . Hypertension   . GERD (gastroesophageal reflux disease)   . Thyroid disease     HYPOTHYROIDISM  . Aortic stenosis, severe   . Embolism involving retinal artery   . BPH (benign prostatic hypertrophy)     Dr. Retta Diones  . Iliac artery aneurysm     Dr. Edilia Bo  . AAA (abdominal aortic aneurysm)     Dr. Ashley Royalty  . Diastolic heart failure   . 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 Coronary artery disease     Left main disease and severe 3-vessel CAD  . CAD (coronary artery disease) of bypass graft     Chronic occlusion of saphenous vein graft to RCA  . Hypothyroidism   . Shortness of breath   . Heart murmur   . Stroke     mini stroke effective his eye left  . Arthritis   . 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 Left hip hemiarthropasty      AFTER HIP FX  . Coronary artery bypass graft      1999..Dr. Sheliah Plane  . Total knee arthroplasty      right 1992   left  2002  . Biopsy prostate      2005  . Prostate surgery  2012    TURP  . Joint replacement  2007    THR  . Harvest bone graft  1982    FOR THE  ANKLE  . Skin grafts      1968  . Colonoscopy    . Toe surgery Bilateral   . Transcatheter aortic valve replacement, transapical N/A 01/15/2013    Procedure: TRANSCATHETER AORTIC VALVE REPLACEMENT, TRANSAPICAL;  Surgeon: [name], MD;  Location: [facility] OR;  Service: Open Heart Surgery;  Laterality: N/A;  . Intraoperative transesophageal echocardiogram N/A 01/15/2013    Procedure:  INTRAOPERATIVE TRANSESOPHAGEAL ECHOCARDIOGRAM;  Surgeon: [name], MD;  Location: [facility] OR;  Service: Open Heart Surgery;  Laterality: N/A;  . Carotid endarterectomy  Jan. 2, 1997   Ayven Pheasant A. Mayford Knife, RD, LDN Pager: [phone] After hours Pager: [phone]

## 2014-02-21 NOTE — Progress Notes (Signed)
Physical Therapy Treatment Patient Details Name: Hunter Choi MRN: [id number] DOB: 01-Jul-1927 Today's Date: 02/21/2014    History of Present Illness 80 year old male with history of hypertension, significant coronary artery disease status post CABG, history of stroke, hypothyroidism, severe aortic stenosis s/p TAVR in 2014, rheumatoid arthritis, and GERD who was brought to the ED with 3 day history of ongoing nausea and vomiting and progressive change in mental status since yesterday.  Found to be severely dehydrated and with UTI/sepsis    PT Comments    Patient making progress with mobility and gait.  Follow Up Recommendations  Home health PT;Supervision/Assistance - 24 hour     Equipment Recommendations  None recommended by PT    Recommendations for Other Services       Precautions / Restrictions Precautions Precautions: Fall Restrictions Weight Bearing Restrictions: No    Mobility  Bed Mobility Overal bed mobility: Needs Assistance Bed Mobility: Supine to Sit;Sit to Supine     Supine to sit: Mod assist Sit to supine: Min assist   General bed mobility comments: mod assist to lift trunk, able to lift feet into bed and scoot up to head of bed with very little help  Transfers Overall transfer level: Needs assistance Equipment used: Rolling walker (2 wheeled) Transfers: Sit to/from Stand Sit to Stand: Min assist         General transfer comment: Verbal cues for hand placement.  Assist to rise to standing and for balance.  Ambulation/Gait Ambulation/Gait assistance: Min assist Ambulation Distance (Feet): 46 Feet Assistive device: Rolling walker (2 wheeled) Gait Pattern/deviations: Step-through pattern;Decreased stride length;Shuffle;Trunk flexed;Wide base of support Gait velocity: Decreased Gait velocity interpretation: Below normal speed for age/gender General Gait Details: Verbal cues for safe use of RW.  Patient pushes RW too far ahead of himself.  Cues to  stand upright and stay close to RW for safety.   Stairs            Wheelchair Mobility    Modified Rankin (Stroke Patients Only)       Balance                                    Cognition Arousal/Alertness: Awake/alert Behavior During Therapy: WFL for tasks assessed/performed Overall Cognitive Status: No family/caregiver present to determine baseline cognitive functioning                      Exercises      General Comments        Pertinent Vitals/Pain Pain Assessment: No/denies pain    Home Living                      Prior Function            PT Goals (current goals can now be found in the care plan section) Progress towards PT goals: Progressing toward goals    Frequency  Min 3X/week    PT Plan Current plan remains appropriate    Co-evaluation             End of Session Equipment Utilized During Treatment: Gait belt Activity Tolerance: Patient limited by fatigue Patient left: in bed;with call bell/phone within reach;with bed alarm set     Time: 6553-7482 PT Time Calculation (min) (ACUTE ONLY): 28 min  Charges:  $Gait Training: 23-37 mins  G CodesVena Austria 02/21/2014, 5:41 PM Durenda Hurt. Renaldo Fiddler, [facility] Acute Rehab Services Pager [phone]

## 2014-02-21 NOTE — Progress Notes (Signed)
[facility] PROGRESS NOTE  Hunter Choi WGY:[id number] DOB: 11/08/1927 DOA: 02/19/2014 PCP: Darnelle Bos, MD  Brief narrative 80 year old male with history of hypertension, significant coronary artery disease status post CABG, history of stroke, hypothyroidism, severe aortic stenosis s/p TAVR in 2014, rheumatoid arthritis, and GERD who was brought to the ED with 3 day history of ongoing nausea and vomiting and progressive change in mental status since one day prior to admission. Patient reported dysuria for past several days. He was found to be septic in the ED with hypotension, elevated WBC and lactic acid and acute kidney injury. UA was suggestive of UTI and patient admitted to stepdown for severe sepsis.  Assessment/Plan: Severe sepsis secondary to UTI Now resolved. Has received over 5 L since admission. IV fluids discontinued and given one dose IV Lasix 20 mg this morning. On empiric Rocephin. Cultures growing enterococcus. Sensitivity will be available tomorrow morning as per microbiology. -Remains afebrile and mental status back to baseline. -Supportive care with Tylenol and antiemetics as needed  Acute kidney injury Secondary to sepsis with UTI and dehydration. Resolved with IV fluids.   Acute encephalopathy Secondary to sepsis and now resolved.  Rheumatoid arthritis Continue prednisone but held methotrexate given acute kidney injury. Will resume on discharge  Lactic acidosis Secondary to sepsis and now resolved  Anemia Refer some drop in H&H from baseline and likely dilutional  secondary to IV fluids  thrombocytopenia Likely secondary to sepsis. Slowly improving   Hypothyroidism Continue Synthroid  Severe aortic stenosis status post TAVR in 2014 Stable. Follows with Dr. Laneta Simmers  History of AAA Follows with vascular sx as outpt  CAD s/p CABG Anterior aspirin and statin. Resume beta blocker.  History of stroke Continue aspirin and statin  DVT  prophylaxis: Subcutaneous heparin  Code Status: Full code Family Communication: None at bedside Disposition Plan: home possibly tomorrow. PT and nutrition cnsult   Consultants:  none  Procedures:  none  Antibiotics:  IV rocephin 11/25--  HPI/Subjective: Patient seen and examined. Denies any specific symptoms. Feels better overall  Objective: Filed Vitals:   02/21/14 0825  BP: 144/64  Pulse: 83  Temp:   Resp:     Intake/Output Summary (Last 24 hours) at 02/21/14 1131 Last data filed at 02/20/14 1829  Gross per 24 hour  Intake    890 ml  Output    750 ml  Net    140 ml   Filed Weights   02/19/14 1219 02/20/14 1657 02/21/14 0623  Weight: 72.122 kg (159 lb) 74.299 kg (163 lb 12.8 oz) 75.161 kg (165 lb 11.2 oz)    Exam:   General:  Elderly male lying in bed in no acute distress, very hard of hearing  HEENT: No pallor, moist oral mucosa  Chest: Equal air entry bilaterally, fine basilar crackles  CVS: Normal S1 and S2, no murmurs  Abdomen: Soft, nondistended, nontender, bowel sounds present  Extremities: Warm, no edema  CNS: Alert & oriented   Data Reviewed: Basic Metabolic Panel:  Recent Labs Lab 02/19/14 0642 02/19/14 1025 02/20/14 0306 02/21/14 0210  NA 131* 134* 132* 134*  K 3.8 4.3 4.2 4.2  CL 90* 95* 100 100  CO2  --  21 22 23   GLUCOSE 175* 89 82 89  BUN 43* 38* 23 10  CREATININE 3.50* 2.41* 1.02 0.71  CALCIUM  --  7.9* 7.5* 7.5*  MG  --   --   --  1.5   Liver Function Tests:  Recent Labs Lab 02/19/14  1025 02/21/14 0210  AST 27 22  ALT 9 9  ALKPHOS 61 53  BILITOT 0.3 0.4  PROT 6.5 5.5*  ALBUMIN 2.9* 2.4*   No results for input(s): LIPASE, AMYLASE in the last 168 hours. No results for input(s): AMMONIA in the last 168 hours. CBC:  Recent Labs Lab 02/19/14 0619 02/19/14 0642 02/20/14 0306 02/21/14 0210  WBC 17.3*  --  7.1 6.5  NEUTROABS 12.6*  --   --  3.5  HGB 13.8 16.0 9.6* 9.5*  HCT 41.8 47.0 30.9* 29.7*  MCV  94.6  --  93.6 95.8  PLT 148*  --  95* 101*   Cardiac Enzymes: No results for input(s): CKTOTAL, CKMB, CKMBINDEX, TROPONINI in the last 168 hours. BNP (last 3 results) No results for input(s): PROBNP in the last 8760 hours. CBG:  Recent Labs Lab 02/19/14 0619  GLUCAP 158*    Recent Results (from the past 240 hour(s))  Urine culture     Status: None (Preliminary result)   Collection Time: 02/19/14  6:40 AM  Result Value Ref Range Status   Specimen Description URINE, CATHETERIZED  Final   Special Requests NONE  Final   Culture  Setup Time   Final    02/19/2014 08:42 Performed at [facility]    Colony Count   Final    20,OOO COLONIES/ML Performed at [facility]    Culture   Final    ENTEROCOCCUS SPECIES Performed at [facility]    Report Status PENDING  Incomplete  Blood Culture (routine x 2)     Status: None (Preliminary result)   Collection Time: 02/19/14  7:45 AM  Result Value Ref Range Status   Specimen Description BLOOD LEFT ANTECUBITAL  Final   Special Requests BOTTLES DRAWN AEROBIC AND ANAEROBIC 5CC  Final   Culture  Setup Time   Final    02/19/2014 15:08 Performed at [facility]    Culture   Final           BLOOD CULTURE RECEIVED NO GROWTH TO DATE CULTURE WILL BE HELD FOR 5 DAYS BEFORE ISSUING A FINAL NEGATIVE REPORT Performed at [facility]    Report Status PENDING  Incomplete  Blood Culture (routine x 2)     Status: None (Preliminary result)   Collection Time: 02/19/14  7:49 AM  Result Value Ref Range Status   Specimen Description BLOOD RIGHT ANTECUBITAL  Final   Special Requests BOTTLES DRAWN AEROBIC ONLY 2CC  Final   Culture  Setup Time   Final    02/19/2014 15:08 Performed at [facility]    Culture   Final           BLOOD CULTURE RECEIVED NO GROWTH TO DATE CULTURE WILL BE HELD FOR 5 DAYS BEFORE ISSUING A FINAL NEGATIVE REPORT Performed at [facility]    Report Status PENDING   Incomplete  MRSA PCR Screening     Status: None   Collection Time: 02/19/14  2:09 PM  Result Value Ref Range Status   MRSA by PCR NEGATIVE NEGATIVE Final    Comment:        The GeneXpert MRSA Assay (FDA approved for NASAL specimens only), is one component of a comprehensive MRSA colonization surveillance program. It is not intended to diagnose MRSA infection nor to guide or monitor treatment for MRSA infections.      Studies: No results found.  Scheduled Meds: . aspirin EC  81 mg Oral Daily  .  cefTRIAXone (ROCEPHIN)  IV  1 g Intravenous Q24H  . folic acid  3 mg Oral Daily  . heparin  5,000 Units Subcutaneous 3 times per day  . Influenza vac split quadrivalent PF  0.5 mL Intramuscular Tomorrow-1000  . levothyroxine  25 mcg Oral Once per day on Sun Tue Thu Sat  . pantoprazole  40 mg Oral Daily  . predniSONE  5 mg Oral QODAY  . promethazine  12.5 mg Intravenous Once  . simvastatin  20 mg Oral q1800  . sodium chloride  3 mL Intravenous Q12H   Continuous Infusions:    Time spent: 25 minutes    [employer]  [facility] Pager [phone] If 7PM-7AM, please contact night-coverage at [email or web address], password [facility] 02/21/2014, 11:31 AM  LOS: 2 days

## 2014-02-22 DIAGNOSIS — N390 Urinary tract infection, site not specified: Secondary | ICD-10-CM

## 2014-02-22 DIAGNOSIS — B952 Enterococcus as the cause of diseases classified elsewhere: Secondary | ICD-10-CM

## 2014-02-22 LAB — CBC
HEMATOCRIT: 36.8 % — AB (ref 39.0–52.0)
Hemoglobin: 11.7 g/dL — ABNORMAL LOW (ref 13.0–17.0)
MCH: 29.3 pg (ref 26.0–34.0)
MCHC: 31.8 g/dL (ref 30.0–36.0)
MCV: 92.2 fL (ref 78.0–100.0)
Platelets: 113 10*3/uL — ABNORMAL LOW (ref 150–400)
RBC: 3.99 MIL/uL — AB (ref 4.22–5.81)
RDW: 16.4 % — ABNORMAL HIGH (ref 11.5–15.5)
WBC: 7.1 10*3/uL (ref 4.0–10.5)

## 2014-02-22 LAB — URINE CULTURE

## 2014-02-22 MED ORDER — ENSURE COMPLETE PO LIQD: 237.0000 mL | Freq: Two times a day (BID) | ORAL | Status: DC

## 2014-02-22 MED ORDER — LISINOPRIL 5 MG PO TABS: 5.0000 mg | ORAL_TABLET | Freq: Every day | ORAL | Status: DC

## 2014-02-22 MED ORDER — AMOXICILLIN-POT CLAVULANATE 875-125 MG PO TABS: 1.0000 | ORAL_TABLET | Freq: Two times a day (BID) | ORAL | Status: AC

## 2014-02-22 NOTE — Plan of Care (Signed)
Problem: Phase I Progression Outcomes Goal: OOB as tolerated unless otherwise ordered Outcome: Completed/Met Date Met:  02/22/14     

## 2014-02-22 NOTE — Care Management Note (Signed)
    Page 1 of 2   02/22/2014     4:38:44 PM CARE MANAGEMENT NOTE 02/22/2014  Patient:  Hunter Choi, Hunter Choi   Account Number:  [id number]  Date Initiated:  02/19/2014  Documentation initiated by:  MAYO,HENRIETTA  Subjective/Objective Assessment:   dx sepsis; lives with spouse, ambulates with walker    PCP  Theressa Millard     Action/Plan:   Anticipated DC Date:  02/24/2014   Anticipated DC Plan:  HOME W HOME HEALTH SERVICES      DC Planning Services  CM consult      [facility] Choice  HOME HEALTH   Choice offered to / List presented to:  C-1 Patient   DME arranged  NA      DME agency  NA     HH arranged  HH-2 PT      HH agency  [facility]   Status of service:  Completed, signed off Medicare Important Message given?   (If response is "NO", the following Medicare IM given date fields will be blank) Date Medicare IM given:   Medicare IM given by:   Date Additional Medicare IM given:   Additional Medicare IM given by:    Discharge Disposition:  HOME W HOME HEALTH SERVICES  Per UR Regulation:  Reviewed for med. necessity/level of care/duration of stay  If discussed at Long Length of Stay Meetings, dates discussed:    Comments:  02/22/14 11:00 CM spoke with pt to offer choice of home health agency. Pt chooses [facility] to render HHPT.  Address and contact information verified with pt.  Referral called to [facility] rep, stephanie.  No DME needed.  No other Cm needs were communicated.  Freddy Jaksch, BSN, CM [phone].

## 2014-02-22 NOTE — Plan of Care (Signed)
Problem: Phase I Progression Outcomes Goal: Vital Signs stable- temperature less than 102 Outcome: Completed/Met Date Met:  02/22/14

## 2014-02-22 NOTE — Discharge Summary (Signed)
Physician Discharge Summary  [facility] Choi ZOX:[id number] DOB: [birth year] DOA: 02/19/2014  PCP: Darnelle Bos, MD  Admit date: 02/19/2014 Discharge date: 02/22/2014  Time spent: 35  minutes  Recommendations for Outpatient Follow-up:  1. Discharge home with home health 2. Follow-up with PCP in 1 week. Repeat CBC ( low hb and plt) 3. Patient will be discharged on oral Augmentin twice daily until 12/9 to complete 2 weeks course of antibiotic   Discharge Diagnoses:  Principal Problem:   Severe sepsis with acute organ dysfunction   Active Problems:   Acute kidney injury   Enterococcus UTI   Rheumatoid arthritis   Thyroid disease   Aortic stenosis, severe   BPH (benign prostatic hypertrophy)   Diastolic heart failure   CAD (coronary artery disease) of bypass graft   AAA (abdominal aortic aneurysm) without rupture   Hypotension   Systolic and diastolic CHF, chronic   Arterial hypotension   Thrombocytopenia   Malnutrition of moderate degree   Discharge Condition: Fair  Diet recommendation: Heart healthy  Filed Weights   02/20/14 1657 02/21/14 0623 02/22/14 0554  Weight: 74.299 kg (163 lb 12.8 oz) 75.161 kg (165 lb 11.2 oz) 71.124 kg (156 lb 12.8 oz)    History of present illness:  Please refer to admission H&P for details, but in brief,80 year old male with history of hypertension, significant coronary artery disease status post CABG, history of stroke, hypothyroidism, severe aortic stenosis s/p TAVR in 2014, CHF, rheumatoid arthritis, BPH and GERD who was brought to the [facility] with 3 day history of ongoing nausea and vomiting and progressive change in mental status since one day prior to admission. Patient reported dysuria for past several days. He was found to be septic in the [facility] with hypotension, elevated WBC, lactic acid and acute kidney injury. UA was suggestive of UTI and patient admitted to stepdown for severe sepsis. Renal ultrasound negative for obstruction or  hydronephrosis.  Hospital Course:  [facility], MD Physician Signed Internal Medicine Progress Notes 02/21/2014 11:30 AM    Expand All Collapse All   [facility] PROGRESS NOTE  Hunter Choi WJX:[id number] DOB: 1927/11/21 DOA: 02/19/2014 PCP: Darnelle Bos, MD  Brief narrative 80 year old male with history of hypertension, significant coronary artery disease status post CABG, history of stroke, hypothyroidism, severe aortic stenosis s/p TAVR in 2014, rheumatoid arthritis, and GERD who was brought to the [facility] with 3 day history of ongoing nausea and vomiting and progressive change in mental status since one day prior to admission. Patient reported dysuria for past several days. He was found to be septic in the [facility] with hypotension, elevated WBC and lactic acid and acute kidney injury. UA was suggestive of UTI and patient admitted to stepdown for severe sepsis.  Assessment/Plan: Severe sepsis secondary to enterococcus UTI Admitted to stepdown. Has received over 5 L since admission. Sepsis now resolved. On empiric Rocephin. Cultures growing enterococcus which upon discussion with the lab today is sensitive to penicillin , vancomycin, and cephalosporins, resistant to nitrofurantoin, tetracycline and quinolones.. -Patient will be discharged on oral Augmentin twice daily to complete a 2 weeks course of antibiotics -Remains afebrile and mental status back to baseline.   Acute kidney injury Secondary to sepsis with UTI and dehydration. Resolved with IV fluids.   Acute encephalopathy Secondary to sepsis and now resolved.  Rheumatoid arthritis Continue prednisone. Resume methotrexate upon discharge (held due to acute kidney injury)  Lactic acidosis Secondary to sepsis and now resolved  Anemia  some drop in H&H from  baseline and likely dilutional secondary to IV fluids  thrombocytopenia Likely secondary to sepsis. Slowly improving   Hypothyroidism Continue  Synthroid  Severe aortic stenosis status post TAVR in 2014 Stable. Follows with Dr. Laneta Simmers  Chronic systolic and diastolic CHF. Recent echo showing EF of 40-45% with diffuse hypokinesis. Continue metoprolol, aspirin and statin. We'll add low-dose ACE inhibitor. Currently euvolemic. Follow-up as outpatient  History of AAA Follows with vascular sx as outpt  CAD s/p CABG Continue  aspirin and statin. Resume beta blocker.  History of stroke Continue aspirin and statin  Generalized weakness and protein calorie malnutrition Seen by physical therapy and nutrition. discharged on home health PT. Nutrition supplement added   Code Status: Full code Family Communication: None at bedside Disposition Plan: home  With HHPT   Consultants:  none  Procedures:  none  Antibiotics:  IV rocephin 11/25--    HPI/Subjective: Patient seen and examined. Denies any specific symptoms. Feels better overall  Objective: Filed Vitals:   02/21/14 0825  BP: 144/64  Pulse: 83  Temp:   Resp:     Intake/Output Summary (Last 24 hours) at 02/21/14 1131 Last data filed at 02/20/14 1829  Gross per 24 hour  Intake  890 ml  Output  750 ml  Net  140 ml   Filed Weights   02/19/14 1219 02/20/14 1657 02/21/14 0623  Weight: 72.122 kg (159 lb) 74.299 kg (163 lb 12.8 oz) 75.161 kg (165 lb 11.2 oz)    Exam:   General: Elderly male lying in bed in no acute distress, very hard of hearing  HEENT: No pallor, moist oral mucosa  Chest: Equal air entry bilaterally, fine basilar crackles  CVS: Normal S1 and S2, no murmurs  Abdomen: Soft, nondistended, nontender, bowel sounds present  Extremities: Warm, no edema  CNS: Alert & oriented   Data Reviewed: Basic Metabolic Panel:  Last Labs      Recent Labs Lab 02/19/14 0642 02/19/14 1025 02/20/14 0306 02/21/14 0210  NA 131* 134* 132* 134*  K 3.8 4.3 4.2 4.2  CL 90* 95* 100 100   CO2 --  21 22 23   GLUCOSE 175* 89 82 89  BUN 43* 38* 23 10  CREATININE 3.50* 2.41* 1.02 0.71  CALCIUM --  7.9* 7.5* 7.5*  MG --  --  --  1.5     Liver Function Tests:  Last Labs      Recent Labs Lab 02/19/14 1025 02/21/14 0210  AST 27 22  ALT 9 9  ALKPHOS 61 53  BILITOT 0.3 0.4  PROT 6.5 5.5*  ALBUMIN 2.9* 2.4*      Last Labs     No results for input(s): LIPASE, AMYLASE in the last 168 hours.    Last Labs     No results for input(s): AMMONIA in the last 168 hours.   CBC:  Last Labs      Recent Labs Lab 02/19/14 [phone] 02/19/14 0642 02/20/14 0306 02/21/14 0210  WBC 17.3* --  7.1 6.5  NEUTROABS 12.6* --  --  3.5  HGB 13.8 16.0 9.6* 9.5*  HCT 41.8 47.0 30.9* 29.7*  MCV 94.6 --  93.6 95.8  PLT 148* --  95* 101*     Cardiac Enzymes:  Last Labs     No results for input(s): CKTOTAL, CKMB, CKMBINDEX, TROPONINI in the last 168 hours.   BNP (last 3 results)  Recent Labs (within last 365 days)    No results for input(s): PROBNP in the last 8760  hours.   CBG:  Last Labs      Recent Labs Lab 02/19/14 0619  GLUCAP 158*      Recent Results (from the past 240 hour(s))  Urine culture Status: None (Preliminary result)   Collection Time: 02/19/14 6:40 AM  Result Value Ref Range Status   Specimen Description URINE, CATHETERIZED  Final   Special Requests NONE  Final   Culture Setup Time   Final    02/19/2014 08:42 Performed at [facility]    Colony Count   Final    20,OOO COLONIES/ML Performed at [facility]    Culture   Final    ENTEROCOCCUS SPECIES Performed at [facility]    Report Status PENDING  Incomplete  Blood Culture (routine x 2) Status: None (Preliminary result)   Collection Time: 02/19/14 7:45 AM  Result Value Ref Range Status   Specimen  Description BLOOD LEFT ANTECUBITAL  Final   Special Requests BOTTLES DRAWN AEROBIC AND ANAEROBIC 5CC  Final   Culture Setup Time   Final    02/19/2014 15:08 Performed at [facility]    Culture   Final     BLOOD CULTURE RECEIVED NO GROWTH TO DATE CULTURE WILL BE HELD FOR 5 DAYS BEFORE ISSUING A FINAL NEGATIVE REPORT Performed at [facility]    Report Status PENDING  Incomplete  Blood Culture (routine x 2) Status: None (Preliminary result)   Collection Time: 02/19/14 7:49 AM  Result Value Ref Range Status   Specimen Description BLOOD RIGHT ANTECUBITAL  Final   Special Requests BOTTLES DRAWN AEROBIC ONLY 2CC  Final   Culture Setup Time   Final    02/19/2014 15:08 Performed at [facility]    Culture   Final     BLOOD CULTURE RECEIVED NO GROWTH TO DATE CULTURE WILL BE HELD FOR 5 DAYS BEFORE ISSUING A FINAL NEGATIVE REPORT Performed at [facility]    Report Status PENDING  Incomplete  MRSA PCR Screening Status: None   Collection Time: 02/19/14 2:09 PM  Result Value Ref Range Status   MRSA by PCR NEGATIVE NEGATIVE Final    Comment:   The GeneXpert MRSA Assay (FDA approved for NASAL specimens only), is one component of a comprehensive MRSA colonization surveillance program. It is not intended to diagnose MRSA infection nor to guide or monitor treatment for MRSA infections.      Studies:  Imaging Results (Last 48 hours)    No results found.    Scheduled Meds: . aspirin EC 81 mg Oral Daily  . cefTRIAXone (ROCEPHIN) IV 1 g Intravenous Q24H  . folic acid 3 mg Oral Daily  . heparin 5,000 Units Subcutaneous 3 times per day  . Influenza vac split quadrivalent PF 0.5 mL Intramuscular Tomorrow-1000  . levothyroxine 25 mcg Oral Once per day on Sun Tue Thu Sat  . pantoprazole 40 mg Oral  Daily  . predniSONE 5 mg Oral QODAY  . promethazine 12.5 mg Intravenous Once  . simvastatin 20 mg Oral q1800  . sodium chloride 3 mL Intravenous Q12H   Continuous Infusions:    Time spent: 25 minutes    Bynum Mccullars [facility] Pager [phone] If 7PM-7AM, please contact night-coverage at [email or web address], password [email or web address] 02/21/2014, 11:31 AM  LOS: 2 days                         Discharge Exam: Filed Vitals:   02/22/14 0554  BP: 125/93  Pulse: 66  Temp: 98.6 F (37 C)  Resp:      General: Elderly male lying in bed in no acute distress, very hard of hearing  HEENT: No pallor, moist oral mucosa  Chest: Equal air entry bilaterally,No added sounds  CVS: Normal S1 and S2, no murmurs  Abdomen: Soft, nondistended, nontender, bowel sounds present  Extremities: Warm, no edema  CNS: Alert & oriented   Discharge Instructions You were cared for by a hospitalist during your hospital stay. If you have any questions about your discharge medications or the care you received while you were in the hospital after you are discharged, you can call the unit and asked to speak with the hospitalist on call if the hospitalist that took care of you is not available. Once you are discharged, your primary care physician will handle any further medical issues. Please note that NO REFILLS for any discharge medications will be authorized once you are discharged, as it is imperative that you return to your primary care physician (or establish a relationship with a primary care physician if you do not have one) for your aftercare needs so that they can reassess your need for medications and monitor your lab values.   Current Discharge Medication List    START taking these medications   Details  amoxicillin-clavulanate (AUGMENTIN) 875-125 MG per tablet Take 1 tablet by mouth 2 (two) times daily. Qty: 22 tablet, Refills: 0    feeding  supplement, ENSURE COMPLETE, (ENSURE COMPLETE) LIQD Take 237 mLs by mouth 2 (two) times daily between meals. Qty: 60 Bottle, Refills: 0    lisinopril (PRINIVIL,ZESTRIL) 5 MG tablet Take 1 tablet (5 mg total) by mouth daily. Qty: 30 tablet, Refills: 0      CONTINUE these medications which have NOT CHANGED   Details  acetaminophen (TYLENOL) 325 MG tablet Take 2 tablets (650 mg total) by mouth every 6 (six) hours as needed for pain.    amLODipine (NORVASC) 5 MG tablet Take 5 mg by mouth daily.    aspirin EC 81 MG EC tablet Take 1 tablet (81 mg total) by mouth daily.    diazepam (VALIUM) 5 MG tablet Take 5 mg by mouth daily as needed for anxiety.    folic acid (FOLVITE) 1 MG tablet Take 3 mg by mouth daily.    levothyroxine (SYNTHROID, LEVOTHROID) 25 MCG tablet Take 25 mcg by mouth 4 (four) times a week. Monday, Wednesday,Friday, and Saturday    methotrexate 2.5 MG tablet Take 25 mg by mouth every Saturday.     metoprolol tartrate (LOPRESSOR) 25 MG tablet Take 1 tablet (25 mg total) by mouth daily. Qty: 60 tablet, Refills: 1    mirabegron ER (MYRBETRIQ) 25 MG TB24 tablet Take 25 mg by mouth daily.    omeprazole (PRILOSEC) 20 MG capsule Take 20 mg by mouth daily.    predniSONE (DELTASONE) 5 MG tablet Take 5 mg by mouth every other day.     simvastatin (ZOCOR) 20 MG tablet Take 20 mg by mouth daily at 6 PM.     traMADol (ULTRAM) 50 MG tablet Take 50 mg by mouth every 6 (six) hours as needed for moderate pain.       Allergies  Allergen Reactions  . Ciprofloxacin Other (See Comments)    REACTION: mild delirium   Follow-up Information    Follow up with Darnelle Bos, MD. Schedule an appointment as soon as possible for a visit in 1 week.   Specialty:  Internal Medicine  Contact information:   [address], [address] [place] [address] [phone]        The results of significant diagnostics from this hospitalization (including imaging, microbiology,  ancillary and laboratory) are listed below for reference.    Significant Diagnostic Studies: US Renal  02/19/2014   CLINICAL DATA:  Acute renal failure.  EXAM: RENAL/URINARY TRACT ULTRASOUND COMPLETE  COMPARISON:  CT abdomen and pelvis 12/26/2012.  FINDINGS: Right Kidney:  Length: 10.2 cm. Echogenicity within normal limits. No mass or hydronephrosis visualized.  Left Kidney:  Length: 10.1 cm. Echogenicity within normal limits. No mass or hydronephrosis visualized.  Bladder:  Appears normal for degree of bladder distention.  IMPRESSION: Negative for hydronephrosis.  Negative examination.   Electronically Signed   By: Drusilla Kanner M.D.   On: 02/19/2014 11:09   Dg Chest Portable 1 View  02/19/2014   CLINICAL DATA:  Vomiting.  Cough.  EXAM: PORTABLE CHEST - 1 VIEW  COMPARISON:  02/13/2013.  FINDINGS: Cardiomegaly. Median sternotomy. Trans catheter aortic valve replacement. Basilar atelectasis. No airspace disease. No pleural effusion. Aortic arch atherosclerosis. Monitoring leads project over the chest.  IMPRESSION: Postsurgical changes of the chest and low lung volumes without acute cardiopulmonary disease. Cardiomegaly without failure.   Electronically Signed   By: Andreas Newport M.D.   On: 02/19/2014 07:08    Microbiology: Recent Results (from the past 240 hour(s))  Urine culture     Status: None (Preliminary result)   Collection Time: 02/19/14  6:40 AM  Result Value Ref Range Status   Specimen Description URINE, CATHETERIZED  Final   Special Requests NONE  Final   Culture  Setup Time   Final    02/19/2014 08:42 Performed at [facility]    Colony Count   Final    20,OOO COLONIES/ML Performed at [facility]    Culture   Final    ENTEROCOCCUS SPECIES Performed at [facility]    Report Status PENDING  Incomplete  Blood Culture (routine x 2)     Status: None (Preliminary result)   Collection Time: 02/19/14  7:45 AM  Result Value Ref Range Status    Specimen Description BLOOD LEFT ANTECUBITAL  Final   Special Requests BOTTLES DRAWN AEROBIC AND ANAEROBIC 5CC  Final   Culture  Setup Time   Final    02/19/2014 15:08 Performed at [facility]    Culture   Final           BLOOD CULTURE RECEIVED NO GROWTH TO DATE CULTURE WILL BE HELD FOR 5 DAYS BEFORE ISSUING A FINAL NEGATIVE REPORT Performed at [facility]    Report Status PENDING  Incomplete  Blood Culture (routine x 2)     Status: None (Preliminary result)   Collection Time: 02/19/14  7:49 AM  Result Value Ref Range Status   Specimen Description BLOOD RIGHT ANTECUBITAL  Final   Special Requests BOTTLES DRAWN AEROBIC ONLY 2CC  Final   Culture  Setup Time   Final    02/19/2014 15:08 Performed at [facility]    Culture   Final           BLOOD CULTURE RECEIVED NO GROWTH TO DATE CULTURE WILL BE HELD FOR 5 DAYS BEFORE ISSUING A FINAL NEGATIVE REPORT Performed at [facility]    Report Status PENDING  Incomplete  MRSA PCR Screening     Status: None   Collection Time: 02/19/14  2:09 PM  Result Value Ref  Range Status   MRSA by PCR NEGATIVE NEGATIVE Final    Comment:        The GeneXpert MRSA Assay (FDA approved for NASAL specimens only), is one component of a comprehensive MRSA colonization surveillance program. It is not intended to diagnose MRSA infection nor to guide or monitor treatment for MRSA infections.      Labs: Basic Metabolic Panel:  Recent Labs Lab 02/19/14 0642 02/19/14 1025 02/20/14 0306 02/21/14 0210  NA 131* 134* 132* 134*  K 3.8 4.3 4.2 4.2  CL 90* 95* 100 100  CO2  --  21 22 23   GLUCOSE 175* 89 82 89  BUN 43* 38* 23 10  CREATININE 3.50* 2.41* 1.02 0.71  CALCIUM  --  7.9* 7.5* 7.5*  MG  --   --   --  1.5   Liver Function Tests:  Recent Labs Lab 02/19/14 1025 02/21/14 0210  AST 27 22  ALT 9 9  ALKPHOS 61 53  BILITOT 0.3 0.4  PROT 6.5 5.5*  ALBUMIN 2.9* 2.4*   No results for input(s): LIPASE, AMYLASE  in the last 168 hours. No results for input(s): AMMONIA in the last 168 hours. CBC:  Recent Labs Lab 02/19/14 0619 02/19/14 0642 02/20/14 0306 02/21/14 0210 02/22/14 0321  WBC 17.3*  --  7.1 6.5 7.1  NEUTROABS 12.6*  --   --  3.5  --   HGB 13.8 16.0 9.6* 9.5* 11.7*  HCT 41.8 47.0 30.9* 29.7* 36.8*  MCV 94.6  --  93.6 95.8 92.2  PLT 148*  --  95* 101* 113*   Cardiac Enzymes: No results for input(s): CKTOTAL, CKMB, CKMBINDEX, TROPONINI in the last 168 hours. BNP: BNP (last 3 results) No results for input(s): PROBNP in the last 8760 hours. CBG:  Recent Labs Lab 02/19/14 0619  GLUCAP 158*       Signed:  Reta Norgren  [facility] 02/22/2014, 10:56 AM

## 2014-02-22 NOTE — Plan of Care (Signed)
Problem: Phase I Progression Outcomes Goal: Adequate I & O Outcome: Completed/Met Date Met:  02/22/14

## 2014-02-22 NOTE — Plan of Care (Signed)
Problem: Phase I Progression Outcomes Goal: Initial discharge plan identified Outcome: Completed/Met Date Met:  02/22/14     

## 2014-02-22 NOTE — Plan of Care (Signed)
Problem: Phase I Progression Outcomes Goal: Hemodynamically stable Outcome: Completed/Met Date Met:  02/22/14     

## 2014-02-22 NOTE — Plan of Care (Signed)
Problem: Phase I Progression Outcomes Goal: Other Phase I Outcomes/Goals Outcome: Completed/Met Date Met:  02/22/14

## 2014-02-25 LAB — CULTURE, BLOOD (ROUTINE X 2)
CULTURE: NO GROWTH
Culture: NO GROWTH

## 2014-03-04 ENCOUNTER — Encounter: Payer: Self-pay | Admitting: Vascular Surgery

## 2014-03-05 ENCOUNTER — Ambulatory Visit (HOSPITAL_COMMUNITY)
Admission: RE | Admit: 2014-03-05 | Discharge: 2014-03-05 | Disposition: A | Discharge status: Home / Self Care | Payer: Medicare Other | Source: Ambulatory Visit | Attending: Family | Admitting: Family

## 2014-03-05 ENCOUNTER — Ambulatory Visit (INDEPENDENT_AMBULATORY_CARE_PROVIDER_SITE_OTHER): Payer: Medicare Other | Admitting: Family

## 2014-03-05 ENCOUNTER — Encounter: Payer: Self-pay | Admitting: Family

## 2014-03-05 VITALS — BP 122/64 | HR 63 | Resp 16 | Ht 63.0 in | Wt 154.0 lb

## 2014-03-05 DIAGNOSIS — I714 Abdominal aortic aneurysm, without rupture, unspecified: Secondary | ICD-10-CM

## 2014-03-05 DIAGNOSIS — Z48812 Encounter for surgical aftercare following surgery on the circulatory system: Secondary | ICD-10-CM | POA: Insufficient documentation

## 2014-03-05 DIAGNOSIS — I723 Aneurysm of iliac artery: Secondary | ICD-10-CM

## 2014-03-05 DIAGNOSIS — I6523 Occlusion and stenosis of bilateral carotid arteries: Secondary | ICD-10-CM

## 2014-03-05 NOTE — Progress Notes (Signed)
[facility] [place]  Established Abdominal Aortic Aneurysm  History of Present Illness  Hunter Choi is a 80 y.o. (16-Jul-1927) male patient that Dr. Edilia Bo has been following for an abdominal aortic aneurysm and bilateral common iliac artery aneurysms. He also has a history of right carotid endarterectomy in 1997. He returns today for follow up of AAA and iliac artery aneurysms.  His aneurysms had been stable in size over several years and we were continuing his follow up at yearly intervals. However he was then lost to follow up. This patient presented with severe aortic stenosis. He underwent transcatheter aortic valve replacement using a trans-apical approach by Dr. Laneta Simmers and Dr. Excell Seltzer on 01/15/13. His CT scan in October prior to his procedure showed that his abdominal aorta are measured 4.7 cm at the level of the bifurcation. The right common iliac artery measured 4.6 cm in maximum diameter and the left common iliac artery 3.8 centimeters in maximum diameter. Back in 2010, the distal aorta measured 3.7 cm in maximum diameter. The right common iliac artery measured 3.7 cm in maximum diameter. The left common iliac artery measured 3.5 cm in maximum diameter.  The patient has done amazingly well since his aortic valve replacement. He denies any abdominal pain or back pain. His shortness of breath improved significantly after surgery.  He had a mild stroke as manifested by left eye blindness, just before his 1997 right CEA, no stroke or TIA activity since then. 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 He denies any known history of hemiparesis or aphasia. He denies any unusual back or abdominal pain, he does report chronic intermittent mid back pain, denies sciatic type pain. He uses a walker since he fell and fractured his left hip which was repaired about 1995. He takes a daily ASA, statin, and beta blocker.  He was hospitalized  at [facility] recently for "kidney poisoning and vomiting".   Pt Diabetic: No Pt smoker: former smoker, quit in 1961  Past Medical History  Diagnosis Date  . Hypertension   . GERD (gastroesophageal reflux disease)   . Thyroid disease     HYPOTHYROIDISM  . Aortic stenosis, severe   . Embolism involving retinal artery   . BPH (benign prostatic hypertrophy)     Dr. Retta Diones  . Iliac artery aneurysm     Dr. Edilia Bo  . AAA (abdominal aortic aneurysm)     Dr. Ashley Royalty  . Diastolic heart failure   . 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 Coronary artery disease     Left main disease and severe 3-vessel CAD  . CAD (coronary artery disease) of bypass graft     Chronic occlusion of saphenous vein graft to RCA  . Hypothyroidism   . Shortness of breath   . Heart murmur   . Stroke     mini stroke effective his eye left  . Arthritis   . 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 Atrial fibrillation    Past Surgical History  Procedure Laterality Date  . Left hip hemiarthropasty      AFTER HIP FX  . Coronary artery bypass graft      1999..Dr. Sheliah Plane  . Total  knee arthroplasty      right 1992   left  2002  . Biopsy prostate      2005  . Prostate surgery  2012    TURP  . Joint replacement  2007    THR  . Harvest bone graft  1982    FOR THE  ANKLE  . Skin grafts      1968  . Colonoscopy    . Toe surgery Bilateral   . Transcatheter aortic valve replacement, transapical N/A 01/15/2013    Procedure: TRANSCATHETER AORTIC VALVE REPLACEMENT, TRANSAPICAL;  Surgeon: [name], MD;  Location: [facility] OR;  Service: Open Heart Surgery;  Laterality: N/A;  . Intraoperative transesophageal echocardiogram N/A 01/15/2013    Procedure: INTRAOPERATIVE TRANSESOPHAGEAL ECHOCARDIOGRAM;  Surgeon:  [name], MD;  Location: [facility] OR;  Service: Open Heart Surgery;  Laterality: N/A;  . Carotid endarterectomy  Jan. 2, 1997   Social History History   Social History  . Marital Status: Married    Spouse Name: N/A    Number of Children: N/A  . Years of Education: N/A   Occupational History  . Not on file.   Social History Main Topics  . Smoking status: Former Smoker -- 0.50 packs/day for 47 years    Types: Cigarettes    Quit date: 03/28/1958  . Smokeless tobacco: Former Neurosurgeon    Types: Chew     Comment: ONLY USED 2 YEARS  . Alcohol Use: No  . Drug Use: No  . Sexual Activity: Not on file   Other Topics Concern  . Not on file   Social History Narrative   Family History Family History  Problem Relation Age of Onset  . Heart attack Mother   . Heart disease Mother     Before age 53  . Hyperlipidemia Mother   . Hypertension Mother   . Hypertension Son   . Heart disease Father     AAA-   . Hyperlipidemia Father   . Heart attack Father   . Hypertension Father   . Heart disease Brother     Before age 60  . Hyperlipidemia Brother   . Heart attack Brother   . Hypertension Brother     Current Outpatient Prescriptions on File Prior to Visit  Medication Sig Dispense Refill  . acetaminophen (TYLENOL) 325 MG tablet Take 2 tablets (650 mg total) by mouth every 6 (six) hours as needed for pain.    Marland Kitchen amLODipine (NORVASC) 5 MG tablet Take 5 mg by mouth daily.    Marland Kitchen amoxicillin-clavulanate (AUGMENTIN) 875-125 MG per tablet Take 1 tablet by mouth 2 (two) times daily. 22 tablet 0  . aspirin EC 81 MG EC tablet Take 1 tablet (81 mg total) by mouth daily.    . diazepam (VALIUM) 5 MG tablet Take 5 mg by mouth daily as needed for anxiety.    . feeding supplement, ENSURE COMPLETE, (ENSURE COMPLETE) LIQD Take 237 mLs by mouth 2 (two) times daily between meals. 60 Bottle 0  . folic acid (FOLVITE) 1 MG tablet Take 3 mg by mouth daily.    Marland Kitchen levothyroxine (SYNTHROID, LEVOTHROID) 25 MCG  tablet Take 25 mcg by mouth 4 (four) times a week. Monday, Wednesday,Friday, and Saturday    . lisinopril (PRINIVIL,ZESTRIL) 5 MG tablet Take 1 tablet (5 mg total) by mouth daily. 30 tablet 0  . methotrexate 2.5 MG tablet Take 25 mg by mouth every Saturday.     . metoprolol tartrate (LOPRESSOR) 25 MG tablet  Take 1 tablet (25 mg total) by mouth daily. 60 tablet 1  . omeprazole (PRILOSEC) 20 MG capsule Take 20 mg by mouth daily.    . predniSONE (DELTASONE) 5 MG tablet Take 5 mg by mouth every other day.     . simvastatin (ZOCOR) 20 MG tablet Take 20 mg by mouth daily at 6 PM.     . traMADol (ULTRAM) 50 MG tablet Take 50 mg by mouth every 6 (six) hours as needed for moderate pain.    . mirabegron ER (MYRBETRIQ) 25 MG TB24 tablet Take 25 mg by mouth daily.     No current facility-administered medications on file prior to visit.   Allergies  Allergen Reactions  . Ciprofloxacin Other (See Comments)    REACTION: mild delirium    ROS: See HPI for pertinent positives and negatives.  Physical Examination  Filed Vitals:   03/05/14 1105  BP: 122/64  Pulse: 63  Resp: 16  Height: 5\' 3"  (1.6 m)  Weight: 154 lb (69.854 kg)  SpO2: 97%   Body mass index is 27.29 kg/(m^2).   General: WDWN in NAD Gait: Normal HENT: WNL Eyes: Pupils equal Pulmonary: normal non-labored breathing , without Rales, rhonchi, wheezing Cardiac: Irregular, murmur detected  Abdomen: soft, NT, no palpable masses Skin: no rashes, ulcers noted; no Gangrene , no cellulitis; no open wounds.  VASCULAR EXAM  Carotid Bruits Left Right   Transmitted cardiac murmur Transmitted cardiac murmur  Aorta is palpable Radial pulses are 2+ palpable and =   VASCULAR EXAM: Extremities without ischemic changes  without Gangrene; without open wounds.      LE Pulses LEFT RIGHT   FEMORAL 3+ palpable 3+ palpable    POPLITEAL not palpable  not palpable   POSTERIOR TIBIAL not palpable  not palpable    DORSALIS PEDIS  ANTERIOR TIBIAL not palpable  not palpable     Musculoskeletal: + muscle wastingandr atrophy, RA changes in hands; 1+ non pitting edema in feet Neurologic: A&O X 3; Appropriate Affect ;  SENSATION: normal; MOTOR FUNCTION: 5/5 in UE's, 4/5 in LE's Symmetric, CN 2-12 intact except for hard of hearing and loss of vision in left eye Speech is fluent/normal   Non-Invasive Vascular Imaging  AAA Duplex (03/05/2014) ABDOMINAL AORTA DUPLEX EVALUATION    INDICATION: Abdominal aortic aneurysm    PREVIOUS INTERVENTION(S):     DUPLEX EXAM:     LOCATION DIAMETER AP (cm) DIAMETER TRANSVERSE (cm) VELOCITIES (cm/sec)  Aorta Proximal Not Visualized  Not Visualized    Aorta Mid 2.7 2.7 93  Aorta Distal 4.6 4.4 83  Right Common Iliac Artery 4.9 4.9 58  Left Common Iliac Artery 4.1 4.2 30    Previous max aortic diameter:  4.7cm (Right CIA = 4.9cm) (Left CIA = 4.3cm) Date: 09/04/13     ADDITIONAL FINDINGS: . Limited visualization of the abdominal vasculature due to overlying bowel gas. . Mural thrombus noted in the bilateral proximal common iliac arteries with no hemodynamically significant stenosis of the abdominal aorta and bilateral proximal common iliac arteries.    IMPRESSION: Aneurysmal dilatation of the distal abdominal aorta and bilateral proximal common iliac arteries with diameters noted above.     Compared to the previous exam:  No significant change in the abdominal aortic or bilateral common iliac artery aneurysms when compared to the previous exam on 09/04/13.       Medical Decision Making  The patient is a 80 y.o. male who presents with asymptomatic AAA with no significant change in  the abdominal aortic or bilateral common iliac artery aneurysms when compared to the  previous exam on 09/04/13.  On 03/06/13 Dr. Adele Dan progress note assessment is as follows: This patient has a 4.7 cm distal infrarenal abdominal aortic aneurysm, a 4.7 cm right common iliac artery aneurysm, and a 3.8 cm left common iliac artery aneurysm, and bilateral hypogastric artery aneurysms. Given his age and medical comorbidities, he is not a good candidate for open repair of his aneurysms. Likewise, as described above he did not appear to be a good candidate for EVAR as this would require exclusion of both hypogastric arteries. Fortunately the aneurysms have only enlarged slightly over the last 4 years. I have recommended a conservative approach. The son who is with him today he is in agreement with that plan.   Based on this patient's exam and diagnostic studies, and after discussing with Dr. Edilia Bo,  the patient will follow up in 6 months with the following tests: AAA Duplex, carotid artery Duplex.   Consideration for repair of AAA would be made when the size is 5.5 cm, growth > 1 cm/yr, and symptomatic status.  I emphasized the importance of maximal medical management including strict control of blood pressure, blood glucose, and lipid levels, antiplatelet agents, obtaining regular exercise, and continued cessation of smoking.   The patient was given information about AAA including signs, symptoms, treatment, and how to minimize the risk of enlargement and rupture of aneurysms.    The patient was advised to call [phone] should the patient experience sudden onset abdominal or back pain.   Thank you for allowing [place] to participate in this patient's care.  Charisse March, RN, MSN, FNP-C [facility] [place] Office: [phone]  Clinic Physician: Edilia Bo  03/05/2014, 11:08 AM

## 2014-03-05 NOTE — Patient Instructions (Signed)
Abdominal Aortic Aneurysm An aneurysm is a weakened or damaged part of an artery wall that bulges from the normal force of blood pumping through the body. An abdominal aortic aneurysm is an aneurysm that occurs in the lower part of the aorta, the main artery of the body.  The major concern with an abdominal aortic aneurysm is that it can enlarge and burst (rupture) or blood can flow between the layers of the wall of the aorta through a tear (aorticdissection). Both of these conditions can cause bleeding inside the body and can be life threatening unless diagnosed and treated promptly. CAUSES  The exact cause of an abdominal aortic aneurysm is unknown. Some contributing factors are:   A hardening of the arteries caused by the buildup of fat and other substances in the lining of a blood vessel (arteriosclerosis).  Inflammation of the walls of an artery (arteritis).   Connective tissue diseases, such as Marfan syndrome.   Abdominal trauma.   An infection, such as syphilis or staphylococcus, in the wall of the aorta (infectious aortitis) caused by bacteria. RISK FACTORS  Risk factors that contribute to an abdominal aortic aneurysm may include:  Age older than 60 years.   High blood pressure (hypertension).  Male gender.  Ethnicity (white race).  Obesity.  Family history of aneurysm (first degree relatives only).  Tobacco use. PREVENTION  The following healthy lifestyle habits may help decrease your risk of abdominal aortic aneurysm:  Quitting smoking. Smoking can raise your blood pressure and cause arteriosclerosis.  Limiting or avoiding alcohol.  Keeping your blood pressure, blood sugar level, and cholesterol levels within normal limits.  Decreasing your salt intake. In somepeople, too much salt can raise blood pressure and increase your risk of abdominal aortic aneurysm.  Eating a diet low in saturated fats and cholesterol.  Increasing your fiber intake by including  whole grains, vegetables, and fruits in your diet. Eating these foods may help lower blood pressure.  Maintaining a healthy weight.  Staying physically active and exercising regularly. SYMPTOMS  The symptoms of abdominal aortic aneurysm may vary depending on the size and rate of growth of the aneurysm.Most grow slowly and do not have any symptoms. When symptoms do occur, they may include:  Pain (abdomen, side, lower back, or groin). The pain may vary in intensity. A sudden onset of severe pain may indicate that the aneurysm has ruptured.  Feeling full after eating only small amounts of food.  Nausea or vomiting or both.  Feeling a pulsating lump in the abdomen.  Feeling faint or passing out. DIAGNOSIS  Since most unruptured abdominal aortic aneurysms have no symptoms, they are often discovered during diagnostic exams for other conditions. An aneurysm may be found during the following procedures:  Ultrasonography (A one-time screening for abdominal aortic aneurysm by ultrasonography is also recommended for all men aged 65-75 years who have ever smoked).  X-ray exams.  A computed tomography (CT).  Magnetic resonance imaging (MRI).  Angiography or arteriography. TREATMENT  Treatment of an abdominal aortic aneurysm depends on the size of your aneurysm, your age, and risk factors for rupture. Medication to control blood pressure and pain may be used to manage aneurysms smaller than 6 cm. Regular monitoring for enlargement may be recommended by your caregiver if:  The aneurysm is 3-4 cm in size (an annual ultrasonography may be recommended).  The aneurysm is 4-4.5 cm in size (an ultrasonography every 6 months may be recommended).  The aneurysm is larger than 4.5 cm in   size (your caregiver may ask that you be examined by a vascular surgeon). If your aneurysm is larger than 6 cm, surgical repair may be recommended. There are two main methods for repair of an aneurysm:   Endovascular  repair (a minimally invasive surgery). This is done most often.  Open repair. This method is used if an endovascular repair is not possible. Document Released: 12/22/2004 Document Revised: 07/09/2012 Document Reviewed: 04/13/2012 ExitCare Patient Information 2015 [employer]. This information is not intended to replace advice given to you by your health care provider. Make sure you discuss any questions you have with your health care provider.  

## 2014-03-06 ENCOUNTER — Encounter (HOSPITAL_COMMUNITY): Payer: Self-pay | Admitting: Interventional Cardiology

## 2014-05-06 ENCOUNTER — Ambulatory Visit (INDEPENDENT_AMBULATORY_CARE_PROVIDER_SITE_OTHER): Payer: Medicare Other | Admitting: Interventional Cardiology

## 2014-05-06 ENCOUNTER — Encounter: Payer: Self-pay | Admitting: Interventional Cardiology

## 2014-05-06 VITALS — BP 116/68 | HR 56 | Ht 64.5 in | Wt 152.0 lb

## 2014-05-06 DIAGNOSIS — Z954 Presence of other heart-valve replacement: Secondary | ICD-10-CM

## 2014-05-06 DIAGNOSIS — I5032 Chronic diastolic (congestive) heart failure: Secondary | ICD-10-CM

## 2014-05-06 DIAGNOSIS — Z952 Presence of prosthetic heart valve: Secondary | ICD-10-CM

## 2014-05-06 DIAGNOSIS — Z951 Presence of aortocoronary bypass graft: Secondary | ICD-10-CM

## 2014-05-06 DIAGNOSIS — I10 Essential (primary) hypertension: Secondary | ICD-10-CM

## 2014-05-06 NOTE — Progress Notes (Signed)
Cardiology Office Note   Date:  05/06/2014   ID:  Hunter Choi, DOB 1927-10-10, MRN [id number]  PCP:  Kristie Cowman, MD  Cardiologist:   Lesleigh Noe, MD   No chief complaint on file.     History of Present Illness: Hunter Choi is a 81 y.o. male who presents for aortic valve disease. He is status post TAVR in October 2014. He has gotten progressively better since that time. He denies dyspnea, chest pain, palpitations, and syncope. He is limited by his severe rheumatoid arthritis. He has not had palpitations. No transient neurological symptoms.    Past Medical History  Diagnosis Date  . Hypertension   . GERD (gastroesophageal reflux disease)   . Thyroid disease     HYPOTHYROIDISM  . Aortic stenosis, severe   . Embolism involving retinal artery   . BPH (benign prostatic hypertrophy)     Dr. Retta Diones  . Iliac artery aneurysm     Dr. Edilia Bo  . AAA (abdominal aortic aneurysm)     Dr. Ashley Royalty  . Diastolic heart failure   . 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 Coronary artery disease     Left main disease and severe 3-vessel CAD  . CAD (coronary artery disease) of bypass graft     Chronic occlusion of saphenous vein graft to RCA  . Hypothyroidism   . Shortness of breath   . Heart murmur   . Stroke     mini stroke effective his eye left  . Arthritis   . 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 Atrial fibrillation     Past Surgical History  Procedure Laterality Date  . Left hip hemiarthropasty      AFTER HIP FX  . Coronary artery bypass graft      1999..Dr. Sheliah Plane  . Total knee arthroplasty      right 1992   left  2002  . Biopsy prostate      2005  . Prostate surgery  2012    TURP  . Joint replacement  2007    THR  . Harvest bone graft   1982    FOR THE  ANKLE  . Skin grafts      1968  . Colonoscopy    . Toe surgery Bilateral   . Transcatheter aortic valve replacement, transapical N/A 01/15/2013    Procedure: TRANSCATHETER AORTIC VALVE REPLACEMENT, TRANSAPICAL;  Surgeon: [name], MD;  Location: [facility] OR;  Service: Open Heart Surgery;  Laterality: N/A;  . Intraoperative transesophageal echocardiogram N/A 01/15/2013    Procedure: INTRAOPERATIVE TRANSESOPHAGEAL ECHOCARDIOGRAM;  Surgeon: [name], MD;  Location: [facility] OR;  Service: Open Heart Surgery;  Laterality: N/A;  . Carotid endarterectomy  Jan. 2, 1997  . Left and right heart catheterization with coronary angiogram N/A 12/20/2012    Procedure: LEFT AND RIGHT HEART CATHETERIZATION WITH CORONARY ANGIOGRAM;  Surgeon: Lesleigh Noe, MD;  Location: [facility] CATH LAB;  Service: Cardiovascular;  Laterality: N/A;     Current Outpatient Prescriptions  Medication Sig Dispense Refill  . acetaminophen (TYLENOL) 325 MG tablet Take 2 tablets (650 mg total) by mouth every 6 (six) hours as needed for pain.    Marland Kitchen amLODipine (NORVASC) 5 MG tablet Take 5  mg by mouth daily.    Marland Kitchen aspirin EC 81 MG EC tablet Take 1 tablet (81 mg total) by mouth daily.    . diazepam (VALIUM) 5 MG tablet Take 5 mg by mouth daily as needed for anxiety.    . feeding supplement, ENSURE COMPLETE, (ENSURE COMPLETE) LIQD Take 237 mLs by mouth 2 (two) times daily between meals. 60 Bottle 0  . folic acid (FOLVITE) 1 MG tablet Take 3 mg by mouth daily.    Marland Kitchen levothyroxine (SYNTHROID, LEVOTHROID) 25 MCG tablet Take 25 mcg by mouth 4 (four) times a week. Monday, Wednesday,Friday, and Saturday    . lisinopril (PRINIVIL,ZESTRIL) 5 MG tablet Take 1 tablet (5 mg total) by mouth daily. 30 tablet 0  . methotrexate 2.5 MG tablet Take 25 mg by mouth every Saturday.     . metoprolol tartrate (LOPRESSOR) 25 MG tablet Take 1 tablet (25 mg total) by mouth daily. 60 tablet 1  . mirabegron ER (MYRBETRIQ) 25 MG TB24 tablet Take 25 mg  by mouth daily.    Marland Kitchen omeprazole (PRILOSEC) 20 MG capsule Take 20 mg by mouth daily.    . predniSONE (DELTASONE) 5 MG tablet Take 5 mg by mouth every other day.     . simvastatin (ZOCOR) 20 MG tablet Take 20 mg by mouth daily at 6 PM.      No current facility-administered medications for this visit.    Allergies:   Ciprofloxacin    Social History:  The patient  reports that he quit smoking about 56 years ago. His smoking use included Cigarettes. He has a 23.5 pack-year smoking history. He has quit using smokeless tobacco. His smokeless tobacco use included Chew. He reports that he does not drink alcohol or use illicit drugs.   Family History:  The patient's family history includes Heart attack in his brother, father, and mother; Heart disease in his brother, father, and mother; Hyperlipidemia in his brother, father, and mother; Hypertension in his brother, father, mother, and son.    ROS:  Please see the history of present illness.   Otherwise, review of systems are positive for none.   All other systems are reviewed and negative.    PHYSICAL EXAM: VS:  BP 116/68 mmHg  Pulse 56  Ht 5' 4.5" (1.638 m)  Wt 152 lb (68.947 kg)  BMI 25.70 kg/m2  SpO2 97% , BMI Body mass index is 25.7 kg/(m^2). GEN: Well nourished, well developed, in no acute distress HEENT: normal Neck: no JVD, carotid bruits, or masses Cardiac: RRR; off one of 6 systolic murmur. No diastolic murmurs no rubs, or gallops,no edema  Respiratory:  clear to auscultation bilaterally, normal work of breathing GI: soft, nontender, nondistended, + BS MS: no deformity or atrophy Skin: warm and dry, no rash Neuro:  Strength and sensation are intact Psych: euthymic mood, full affect   EKG:  EKG is not ordered today. The ekg ordered today demonstrates    Recent Labs: 02/21/2014: ALT 9; BUN 10; Creatinine 0.71; Magnesium 1.5; Potassium 4.2; Sodium 134* 02/22/2014: Hemoglobin 11.7*; Platelets 113*    Lipid Panel      Component Value Date/Time   CHOL 84 02/21/2014 0210   TRIG 93 02/21/2014 0210   HDL 27* 02/21/2014 0210   CHOLHDL 3.1 02/21/2014 0210   VLDL 19 02/21/2014 0210   LDLCALC 38 02/21/2014 0210      Wt Readings from Last 3 Encounters:  05/06/14 152 lb (68.947 kg)  03/05/14 154 lb (69.854 kg)  02/22/14 156  lb 12.8 oz (71.124 kg)      Other studies Reviewed: Additional studies/ records that were reviewed today include: . Review of the above records demonstrates:    ASSESSMENT AND PLAN:  1.  Calcific aortic stenosis now greater than 1 years status post transaortic valve replacement. Rheumatic improvement in overall exertional capacity. 2. Coronary artery disease without angina 3. Chronic diastolic heart failure, stable without evidence of volume overload     Current medicines are reviewed at length with the patient today.  The patient does not have concerns regarding medicines.  The following changes have been made:  no change  Labs/ tests ordered today include: None  No orders of the defined types were placed in this encounter.     Disposition:   FU with Mendel Ryder in 1 Year   Signed, Lesleigh Noe, MD  05/06/2014 1:57 PM    [facility] [address] [place], [place], [place]  [address] Phone: [phone]; Fax: [phone]

## 2014-05-06 NOTE — Patient Instructions (Signed)
Your physician recommends that you continue on your current medications as directed. Please refer to the Current Medication list given to you today.  Your physician wants you to follow-up in: 1 year with Dr.Smith You will receive a reminder letter in the mail two months in advance. If you don't receive a letter, please call our office to schedule the follow-up appointment.  

## 2014-05-23 ENCOUNTER — Encounter (HOSPITAL_COMMUNITY): Payer: Self-pay | Admitting: Emergency Medicine

## 2014-05-23 ENCOUNTER — Emergency Department (HOSPITAL_COMMUNITY): Payer: Medicare Other

## 2014-05-23 ENCOUNTER — Observation Stay (HOSPITAL_COMMUNITY)
Admission: EM | Admit: 2014-05-23 | Discharge: 2014-05-25 | Disposition: A | Discharge status: Home / Self Care | Payer: Medicare Other | Attending: Internal Medicine | Admitting: Internal Medicine

## 2014-05-23 DIAGNOSIS — M1990 Unspecified osteoarthritis, unspecified site: Secondary | ICD-10-CM | POA: Diagnosis not present

## 2014-05-23 DIAGNOSIS — I4891 Unspecified atrial fibrillation: Secondary | ICD-10-CM | POA: Diagnosis not present

## 2014-05-23 DIAGNOSIS — D649 Anemia, unspecified: Secondary | ICD-10-CM

## 2014-05-23 DIAGNOSIS — I2510 Atherosclerotic heart disease of native coronary artery without angina pectoris: Secondary | ICD-10-CM | POA: Insufficient documentation

## 2014-05-23 DIAGNOSIS — N189 Chronic kidney disease, unspecified: Secondary | ICD-10-CM | POA: Diagnosis not present

## 2014-05-23 DIAGNOSIS — I129 Hypertensive chronic kidney disease with stage 1 through stage 4 chronic kidney disease, or unspecified chronic kidney disease: Secondary | ICD-10-CM | POA: Diagnosis not present

## 2014-05-23 DIAGNOSIS — D696 Thrombocytopenia, unspecified: Secondary | ICD-10-CM | POA: Diagnosis present

## 2014-05-23 DIAGNOSIS — Z79899 Other long term (current) drug therapy: Secondary | ICD-10-CM | POA: Diagnosis not present

## 2014-05-23 DIAGNOSIS — Z951 Presence of aortocoronary bypass graft: Secondary | ICD-10-CM | POA: Insufficient documentation

## 2014-05-23 DIAGNOSIS — I5042 Chronic combined systolic (congestive) and diastolic (congestive) heart failure: Secondary | ICD-10-CM | POA: Diagnosis present

## 2014-05-23 DIAGNOSIS — K529 Noninfective gastroenteritis and colitis, unspecified: Secondary | ICD-10-CM | POA: Diagnosis present

## 2014-05-23 DIAGNOSIS — N179 Acute kidney failure, unspecified: Secondary | ICD-10-CM | POA: Insufficient documentation

## 2014-05-23 DIAGNOSIS — I723 Aneurysm of iliac artery: Secondary | ICD-10-CM | POA: Diagnosis not present

## 2014-05-23 DIAGNOSIS — N400 Enlarged prostate without lower urinary tract symptoms: Secondary | ICD-10-CM | POA: Insufficient documentation

## 2014-05-23 DIAGNOSIS — I5030 Unspecified diastolic (congestive) heart failure: Secondary | ICD-10-CM | POA: Insufficient documentation

## 2014-05-23 DIAGNOSIS — Z87891 Personal history of nicotine dependence: Secondary | ICD-10-CM | POA: Diagnosis not present

## 2014-05-23 DIAGNOSIS — I350 Nonrheumatic aortic (valve) stenosis: Secondary | ICD-10-CM | POA: Diagnosis not present

## 2014-05-23 DIAGNOSIS — R011 Cardiac murmur, unspecified: Secondary | ICD-10-CM | POA: Diagnosis not present

## 2014-05-23 DIAGNOSIS — H349 Unspecified retinal vascular occlusion: Secondary | ICD-10-CM | POA: Insufficient documentation

## 2014-05-23 DIAGNOSIS — R1013 Epigastric pain: Secondary | ICD-10-CM | POA: Diagnosis not present

## 2014-05-23 DIAGNOSIS — Z7952 Long term (current) use of systemic steroids: Secondary | ICD-10-CM | POA: Insufficient documentation

## 2014-05-23 DIAGNOSIS — M069 Rheumatoid arthritis, unspecified: Secondary | ICD-10-CM

## 2014-05-23 DIAGNOSIS — I714 Abdominal aortic aneurysm, without rupture, unspecified: Secondary | ICD-10-CM | POA: Diagnosis present

## 2014-05-23 DIAGNOSIS — I959 Hypotension, unspecified: Secondary | ICD-10-CM | POA: Diagnosis present

## 2014-05-23 DIAGNOSIS — K219 Gastro-esophageal reflux disease without esophagitis: Secondary | ICD-10-CM | POA: Insufficient documentation

## 2014-05-23 DIAGNOSIS — E860 Dehydration: Secondary | ICD-10-CM | POA: Diagnosis not present

## 2014-05-23 DIAGNOSIS — Z8673 Personal history of transient ischemic attack (TIA), and cerebral infarction without residual deficits: Secondary | ICD-10-CM | POA: Diagnosis not present

## 2014-05-23 DIAGNOSIS — R3129 Other microscopic hematuria: Secondary | ICD-10-CM | POA: Diagnosis present

## 2014-05-23 DIAGNOSIS — R312 Other microscopic hematuria: Secondary | ICD-10-CM

## 2014-05-23 DIAGNOSIS — Z7982 Long term (current) use of aspirin: Secondary | ICD-10-CM | POA: Diagnosis not present

## 2014-05-23 DIAGNOSIS — Z9889 Other specified postprocedural states: Secondary | ICD-10-CM | POA: Diagnosis not present

## 2014-05-23 DIAGNOSIS — I9589 Other hypotension: Secondary | ICD-10-CM

## 2014-05-23 DIAGNOSIS — E039 Hypothyroidism, unspecified: Secondary | ICD-10-CM | POA: Insufficient documentation

## 2014-05-23 LAB — CBC WITH DIFFERENTIAL/PLATELET
Basophils Absolute: 0.1 10*3/uL (ref 0.0–0.1)
Basophils Relative: 0 % (ref 0–1)
EOS PCT: 0 % (ref 0–5)
Eosinophils Absolute: 0 10*3/uL (ref 0.0–0.7)
HCT: 36.7 % — ABNORMAL LOW (ref 39.0–52.0)
Hemoglobin: 11.7 g/dL — ABNORMAL LOW (ref 13.0–17.0)
Lymphocytes Relative: 20 % (ref 12–46)
Lymphs Abs: 2.6 10*3/uL (ref 0.7–4.0)
MCH: 29.2 pg (ref 26.0–34.0)
MCHC: 31.9 g/dL (ref 30.0–36.0)
MCV: 91.5 fL (ref 78.0–100.0)
MONOS PCT: 13 % — AB (ref 3–12)
Monocytes Absolute: 1.7 10*3/uL — ABNORMAL HIGH (ref 0.1–1.0)
Neutro Abs: 8.9 10*3/uL — ABNORMAL HIGH (ref 1.7–7.7)
Neutrophils Relative %: 67 % (ref 43–77)
Platelets: 148 10*3/uL — ABNORMAL LOW (ref 150–400)
RBC: 4.01 MIL/uL — ABNORMAL LOW (ref 4.22–5.81)
RDW: 17.3 % — ABNORMAL HIGH (ref 11.5–15.5)
WBC: 13.4 10*3/uL — ABNORMAL HIGH (ref 4.0–10.5)

## 2014-05-23 LAB — COMPREHENSIVE METABOLIC PANEL
ALK PHOS: 64 U/L (ref 39–117)
ALT: 11 U/L (ref 0–53)
ANION GAP: 15 (ref 5–15)
AST: 25 U/L (ref 0–37)
Albumin: 4 g/dL (ref 3.5–5.2)
BILIRUBIN TOTAL: 0.8 mg/dL (ref 0.3–1.2)
BUN: 36 mg/dL — AB (ref 6–23)
CHLORIDE: 95 mmol/L — AB (ref 96–112)
CO2: 24 mmol/L (ref 19–32)
Calcium: 9.2 mg/dL (ref 8.4–10.5)
Creatinine, Ser: 1.76 mg/dL — ABNORMAL HIGH (ref 0.50–1.35)
GFR calc Af Amer: 39 mL/min — ABNORMAL LOW (ref >90)
GFR calc non Af Amer: 34 mL/min — ABNORMAL LOW (ref >90)
Glucose, Bld: 122 mg/dL — ABNORMAL HIGH (ref 70–99)
POTASSIUM: 4.2 mmol/L (ref 3.5–5.1)
Sodium: 134 mmol/L — ABNORMAL LOW (ref 135–145)
Total Protein: 7.2 g/dL (ref 6.0–8.3)

## 2014-05-23 LAB — URINE MICROSCOPIC-ADD ON

## 2014-05-23 LAB — I-STAT CHEM 8, ED
BUN: 39 mg/dL — AB (ref 6–23)
CREATININE: 1.7 mg/dL — AB (ref 0.50–1.35)
Calcium, Ion: 1.11 mmol/L — ABNORMAL LOW (ref 1.13–1.30)
Chloride: 95 mmol/L — ABNORMAL LOW (ref 96–112)
Glucose, Bld: 119 mg/dL — ABNORMAL HIGH (ref 70–99)
HCT: 42 % (ref 39.0–52.0)
Hemoglobin: 14.3 g/dL (ref 13.0–17.0)
Potassium: 4.1 mmol/L (ref 3.5–5.1)
SODIUM: 135 mmol/L (ref 135–145)
TCO2: 23 mmol/L (ref 0–100)

## 2014-05-23 LAB — I-STAT CG4 LACTIC ACID, ED: LACTIC ACID, VENOUS: 2.62 mmol/L — AB (ref 0.5–2.0)

## 2014-05-23 LAB — URINALYSIS, ROUTINE W REFLEX MICROSCOPIC
Glucose, UA: NEGATIVE mg/dL
Ketones, ur: NEGATIVE mg/dL
LEUKOCYTES UA: NEGATIVE
Nitrite: NEGATIVE
Protein, ur: 30 mg/dL — AB
Specific Gravity, Urine: 1.03 (ref 1.005–1.030)
UROBILINOGEN UA: 1 mg/dL (ref 0.0–1.0)
pH: 5 (ref 5.0–8.0)

## 2014-05-23 LAB — LIPASE, BLOOD: Lipase: 19 U/L (ref 11–59)

## 2014-05-23 LAB — I-STAT TROPONIN, ED: TROPONIN I, POC: 0.07 ng/mL (ref 0.00–0.08)

## 2014-05-23 MED ORDER — SODIUM CHLORIDE 0.9 % IV SOLN
INTRAVENOUS | Status: AC
  Administered 2014-05-23: 21:00:00 via INTRAVENOUS

## 2014-05-23 MED ORDER — OCUVITE PO TABS
1.0000 | ORAL_TABLET | Freq: Every day | ORAL | Status: DC
  Administered 2014-05-24: 1 via ORAL
  Filled 2014-05-23: qty 1

## 2014-05-23 MED ORDER — FOLIC ACID 1 MG PO TABS
3.0000 mg | ORAL_TABLET | Freq: Every day | ORAL | Status: DC
  Administered 2014-05-24: 3 mg via ORAL
  Filled 2014-05-23: qty 3

## 2014-05-23 MED ORDER — ASPIRIN EC 81 MG PO TBEC
81.0000 mg | DELAYED_RELEASE_TABLET | Freq: Every day | ORAL | Status: DC
  Administered 2014-05-24: 81 mg via ORAL
  Filled 2014-05-23: qty 1

## 2014-05-23 MED ORDER — OXYCODONE HCL 5 MG PO TABS: 5.0000 mg | ORAL_TABLET | ORAL | Status: DC | PRN

## 2014-05-23 MED ORDER — PANTOPRAZOLE SODIUM 40 MG PO TBEC
40.0000 mg | DELAYED_RELEASE_TABLET | Freq: Every day | ORAL | Status: DC
  Administered 2014-05-24: 40 mg via ORAL
  Filled 2014-05-23: qty 1

## 2014-05-23 MED ORDER — ACETAMINOPHEN 650 MG RE SUPP: 650.0000 mg | Freq: Four times a day (QID) | RECTAL | Status: DC | PRN

## 2014-05-23 MED ORDER — ACETAMINOPHEN 325 MG PO TABS
650.0000 mg | ORAL_TABLET | Freq: Four times a day (QID) | ORAL | Status: DC | PRN
  Administered 2014-05-25: 650 mg via ORAL
  Filled 2014-05-23: qty 2

## 2014-05-23 MED ORDER — LEVOTHYROXINE SODIUM 25 MCG PO TABS
25.0000 ug | ORAL_TABLET | ORAL | Status: DC
  Administered 2014-05-24: 25 ug via ORAL
  Filled 2014-05-23 (×2): qty 1

## 2014-05-23 MED ORDER — SIMVASTATIN 20 MG PO TABS
20.0000 mg | ORAL_TABLET | Freq: Every day | ORAL | Status: DC
  Administered 2014-05-23 – 2014-05-24 (×2): 20 mg via ORAL
  Filled 2014-05-23 (×3): qty 1

## 2014-05-23 MED ORDER — DIAZEPAM 5 MG PO TABS: 5.0000 mg | ORAL_TABLET | Freq: Every day | ORAL | Status: DC | PRN

## 2014-05-23 MED ORDER — ALUM & MAG HYDROXIDE-SIMETH 200-200-20 MG/5ML PO SUSP: 30.0000 mL | Freq: Four times a day (QID) | ORAL | Status: DC | PRN

## 2014-05-23 MED ORDER — ONDANSETRON HCL 4 MG/2ML IJ SOLN: 4.0000 mg | Freq: Four times a day (QID) | INTRAMUSCULAR | Status: DC | PRN

## 2014-05-23 MED ORDER — IOHEXOL 350 MG/ML SOLN
80.0000 mL | Freq: Once | INTRAVENOUS | Status: AC | PRN
  Administered 2014-05-23: 80 mL via INTRAVENOUS

## 2014-05-23 MED ORDER — SODIUM CHLORIDE 0.9 % IV BOLUS (SEPSIS)
2000.0000 mL | Freq: Once | INTRAVENOUS | Status: AC
  Administered 2014-05-23: 2000 mL via INTRAVENOUS

## 2014-05-23 MED ORDER — HYDROMORPHONE HCL 1 MG/ML IJ SOLN: 0.5000 mg | INTRAMUSCULAR | Status: DC | PRN

## 2014-05-23 MED ORDER — ENOXAPARIN SODIUM 30 MG/0.3ML ~~LOC~~ SOLN
30.0000 mg | SUBCUTANEOUS | Status: DC
  Administered 2014-05-23 – 2014-05-24 (×2): 30 mg via SUBCUTANEOUS
  Filled 2014-05-23 (×3): qty 0.3

## 2014-05-23 MED ORDER — ONDANSETRON HCL 4 MG PO TABS: 4.0000 mg | ORAL_TABLET | Freq: Four times a day (QID) | ORAL | Status: DC | PRN

## 2014-05-23 MED ORDER — PREDNISONE 5 MG PO TABS
5.0000 mg | ORAL_TABLET | ORAL | Status: DC
  Administered 2014-05-24: 5 mg via ORAL
  Filled 2014-05-23: qty 1

## 2014-05-23 MED ORDER — SODIUM CHLORIDE 0.9 % IV SOLN
INTRAVENOUS | Status: AC
  Administered 2014-05-24: 07:00:00 via INTRAVENOUS
  Administered 2014-05-24: 1000 mL via INTRAVENOUS

## 2014-05-23 NOTE — ED Notes (Signed)
Pt c/o N/V x 2 days; pt sent from PCP for dehydration; pt noted to be hypotensive

## 2014-05-23 NOTE — H&P (Signed)
[facility] Admission History and Physical       HASKEL DEWALT GNF:[id number] DOB: 1927-11-19 DOA: 05/23/2014  Referring physician: EDP PCP: Kristie Cowman, MD  Specialists:   Chief Complaint: Nausea and Vomiting  HPI: Hunter Choi is a 81 y.o. male with a history of CAD, Rheumatoid Arthritis, HTN, Severe Aortic Stenosis S/P TAVR, Diastolic CHF, AAA, Hypothyroid, and BPH who was referred to the ED by his PCP after he had been seen in the office for complaints of nausea and vomiting x 2 days.  He denies any diarrhea, or fever.   He reports not being able to hold down any foods or liquids.   He was found to have an elevated BUN/Cr whereas he was previously 10/0.71.  He was also hypotensive and given IV Fluids and had improvement.  A CTA of the ABD/Pelvis was performed and was negative for acute findings.  He was referred for medical admission.      Review of Systems: Constitutional: No Weight Loss, No Weight Gain, Night Sweats, Fevers, Chills, Dizziness, Light Headedness, Fatigue, or Generalized Weakness HEENT: No Headaches, Difficulty Swallowing,Tooth/Dental Problems,Sore Throat,  No Sneezing, Rhinitis, Ear Ache, Nasal Congestion, or Post Nasal Drip,  Cardio-vascular:  No Chest pain, Orthopnea, PND, Edema in Lower Extremities, Anasarca, Dizziness, Palpitations  Resp: No Dyspnea, No DOE, No Productive Cough, No Non-Productive Cough, No Hemoptysis, No Wheezing.    GI: No Heartburn, Indigestion, Abdominal Pain, +Nausea, Vomiting, Diarrhea, Constipation, Hematemesis, Hematochezia, Melena, Change in Bowel Habits,  Loss of Appetite  GU: No Dysuria, No Change in Color of Urine, No Urgency or Urinary Frequency, No Flank pain.  Musculoskeletal: No Joint Pain or Swelling, No Decreased Range of Motion, No Back Pain.  Neurologic: No Syncope, No Seizures, Muscle Weakness, Paresthesia, Vision Disturbance or Loss, No Diplopia, No Vertigo, No Difficulty Walking,  Skin: No Rash or  Lesions. Psych: No Change in Mood or Affect, No Depression or Anxiety, No Memory loss, No Confusion, or Hallucinations   Past Medical History  Diagnosis Date  . Hypertension   . GERD (gastroesophageal reflux disease)   . Thyroid disease     HYPOTHYROIDISM  . Aortic stenosis, severe   . Embolism involving retinal artery   . BPH (benign prostatic hypertrophy)     Dr. Retta Diones  . Iliac artery aneurysm     Dr. Edilia Bo  . AAA (abdominal aortic aneurysm)     Dr. Ashley Royalty  . Diastolic heart failure   . 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 Coronary artery disease     Left main disease and severe 3-vessel CAD  . CAD (coronary artery disease) of bypass graft     Chronic occlusion of saphenous vein graft to RCA  . Hypothyroidism   . Shortness of breath   . Heart murmur   . Stroke     mini stroke effective his eye left  . Arthritis   . 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 Atrial fibrillation      Past Surgical History  Procedure Laterality Date  . Left hip hemiarthropasty      AFTER HIP FX  . Coronary artery bypass graft      1999..Dr. Sheliah Plane  . Total knee arthroplasty      right 1992  left  2002  . Biopsy prostate      2005  . Prostate surgery  2012    TURP  . Joint replacement  2007    THR  . Harvest bone graft  1982    FOR THE  ANKLE  . Skin grafts      1968  . Colonoscopy    . Toe surgery Bilateral   . Transcatheter aortic valve replacement, transapical N/A 01/15/2013    Procedure: TRANSCATHETER AORTIC VALVE REPLACEMENT, TRANSAPICAL;  Surgeon: [name], MD;  Location: [facility] OR;  Service: Open Heart Surgery;  Laterality: N/A;  . Intraoperative transesophageal echocardiogram N/A 01/15/2013    Procedure: INTRAOPERATIVE TRANSESOPHAGEAL ECHOCARDIOGRAM;   Surgeon: [name], MD;  Location: [facility] OR;  Service: Open Heart Surgery;  Laterality: N/A;  . Carotid endarterectomy  Jan. 2, 1997  . Left and right heart catheterization with coronary angiogram N/A 12/20/2012    Procedure: LEFT AND RIGHT HEART CATHETERIZATION WITH CORONARY ANGIOGRAM;  Surgeon: Lesleigh Noe, MD;  Location: [facility] CATH LAB;  Service: Cardiovascular;  Laterality: N/A;      Prior to Admission medications   Medication Sig Start Date End Date Taking? Authorizing Provider  acetaminophen (TYLENOL) 325 MG tablet Take 2 tablets (650 mg total) by mouth every 6 (six) hours as needed for pain. 01/21/13  Yes Donielle Margaretann Loveless, PA-C  amLODipine (NORVASC) 5 MG tablet Take 5 mg by mouth daily.   Yes Historical Provider, MD  aspirin EC 81 MG EC tablet Take 1 tablet (81 mg total) by mouth daily. 01/21/13  Yes Donielle Margaretann Loveless, PA-C  diazepam (VALIUM) 5 MG tablet Take 5 mg by mouth daily as needed for anxiety.   Yes Historical Provider, MD  folic acid (FOLVITE) 1 MG tablet Take 3 mg by mouth daily.   Yes Historical Provider, MD  levothyroxine (SYNTHROID, LEVOTHROID) 25 MCG tablet Take 25 mcg by mouth 4 (four) times a week. Monday, Wednesday,Friday, and Saturday   Yes Historical Provider, MD  methotrexate 2.5 MG tablet Take 25 mg by mouth once a week. Every Thursday   Yes Historical Provider, MD  metoprolol tartrate (LOPRESSOR) 25 MG tablet Take 1 tablet (25 mg total) by mouth daily. 01/21/13  Yes Donielle Margaretann Loveless, PA-C  Multiple Vitamins-Minerals (OCUVITE PO) Take 1 tablet by mouth daily.   Yes Historical Provider, MD  omeprazole (PRILOSEC) 20 MG capsule Take 20 mg by mouth daily.   Yes Historical Provider, MD  predniSONE (DELTASONE) 5 MG tablet Take 5 mg by mouth every other day.    Yes Historical Provider, MD  simvastatin (ZOCOR) 20 MG tablet Take 20 mg by mouth daily at 6 PM.    Yes Historical Provider, MD  feeding supplement, ENSURE COMPLETE, (ENSURE COMPLETE) LIQD Take 237 mLs  by mouth 2 (two) times daily between meals. Patient not taking: Reported on 05/23/2014 02/22/14   Nishant Dhungel, MD  lisinopril (PRINIVIL,ZESTRIL) 5 MG tablet Take 1 tablet (5 mg total) by mouth daily. Patient not taking: Reported on 05/23/2014 02/22/14   Eddie North, MD     Allergies  Allergen Reactions  . Ciprofloxacin Other (See Comments)    REACTION: mild delirium    Social History:  reports that he quit smoking about 56 years ago. His smoking use included Cigarettes. He has a 23.5 pack-year smoking history. He has quit using smokeless tobacco. His smokeless tobacco use included Chew. He reports that he does not drink alcohol or use illicit drugs.  Family History  Problem Relation Age of Onset  . Heart attack Mother   . Heart disease Mother     Before age 54  . Hyperlipidemia Mother   . Hypertension Mother   . Hypertension Son   . Heart disease Father     AAA-   . Hyperlipidemia Father   . Heart attack Father   . Hypertension Father   . Heart disease Brother     Before age 50  . Hyperlipidemia Brother   . Heart attack Brother   . Hypertension Brother        Physical Exam:  GEN:  Pleasant Elderly Well Nourished and Well DEVeloped 81 y.o. Caucasian male examined and in no acute distress; cooperative with exam Filed Vitals:   05/23/14 1900 05/23/14 1915 05/23/14 1930 05/23/14 1945  BP: 136/89 154/57 139/57 138/46  Pulse: 75 73 54 69  Temp:      TempSrc:      Resp: 16 14 16 17   SpO2: 100% 98% 95% 96%   Blood pressure 138/46, pulse 69, temperature 97.8 F (36.6 C), temperature source Oral, resp. rate 17, SpO2 96 %. PSYCH: He is alert and oriented x4; does not appear anxious does not appear depressed; affect is normal HEENT: Normocephalic and Atraumatic, Mucous membranes pink; PERRLA; EOM intact; Fundi:  Benign;  No scleral icterus, Nares: Patent, Oropharynx: Clear, Poor and Sparse Dentition,    Neck:  FROM, No Cervical Lymphadenopathy nor Thyromegaly or  Carotid Bruit; No JVD; Breasts:: Not examined CHEST WALL: No tenderness CHEST: Normal respiration, clear to auscultation bilaterally HEART: Regular rate and rhythm; no murmurs rubs or gallops BACK: No kyphosis or scoliosis; No CVA tenderness ABDOMEN: Positive Bowel Sounds,  Soft Non-Tender, No Rebound or Guarding; No Masses, No Organomegaly. Rectal Exam: Not done EXTREMITIES: No Cyanosis, Clubbing, or Edema; No Ulcerations. Genitalia: not examined PULSES: 2+ and symmetric SKIN: Normal hydration no rash or ulceration CNS:  Alert and Oriented x 4, No Focal Deficits Vascular: pulses palpable throughout    Labs on Admission:  Basic Metabolic Panel:  Recent Labs Lab 05/23/14 1729 05/23/14 1750  NA 134* 135  K 4.2 4.1  CL 95* 95*  CO2 24  --   GLUCOSE 122* 119*  BUN 36* 39*  CREATININE 1.76* 1.70*  CALCIUM 9.2  --    Liver Function Tests:  Recent Labs Lab 05/23/14 1729  AST 25  ALT 11  ALKPHOS 64  BILITOT 0.8  PROT 7.2  ALBUMIN 4.0    Recent Labs Lab 05/23/14 1729  LIPASE 19   No results for input(s): AMMONIA in the last 168 hours. CBC:  Recent Labs Lab 05/23/14 1729 05/23/14 1750  WBC 13.4*  --   NEUTROABS 8.9*  --   HGB 11.7* 14.3  HCT 36.7* 42.0  MCV 91.5  --   PLT 148*  --    Cardiac Enzymes: No results for input(s): CKTOTAL, CKMB, CKMBINDEX, TROPONINI in the last 168 hours.  BNP (last 3 results) No results for input(s): BNP in the last 8760 hours.  ProBNP (last 3 results) No results for input(s): PROBNP in the last 8760 hours.  CBG: No results for input(s): GLUCAP in the last 168 hours.  Radiological Exams on Admission: Ct Cta Abd/pel W/cm &/or W/o Cm  05/23/2014   CLINICAL DATA:  Hypertension, history of AAA  EXAM: CTA ABDOMEN AND PELVIS WITHOUT AND WITH CONTRAST  TECHNIQUE: Multidetector CT imaging of the abdomen and pelvis was performed using the standard protocol during bolus  administration of intravenous contrast. Multiplanar  reconstructed images and MIPs were obtained and reviewed to evaluate the vascular anatomy.  CONTRAST:  40mL OMNIPAQUE IOHEXOL 350 MG/ML SOLN  COMPARISON:  12/26/2012  FINDINGS: Infrarenal abdominal aortic aneurysm just above the iliac bifurcation, measuring 4.4 x 4.1 cm (series 501/image 123), previously 4.4 x 4.2 cm.  4.1 cm left common iliac artery aneurysm (series 501/image 144), previously 4.0 cm.  5.2 cm right common iliac artery aneurysm (series 501/image 153), previously 5.1 cm.  4.3 cm right internal iliac artery aneurysm (series 501/image 167), previously 4.2 cm.  Lower chest: Subpleural reticulation/dependent atelectasis the lung bases.  Cardiomegaly. Prosthetic aortic valve. Postsurgical changes related to prior CABG.  Hepatobiliary: Liver is within normal limits.  Gallbladder is unremarkable. No intrahepatic or extrahepatic ductal dilatation.  Pancreas: Within normal limits.  Spleen: Within normal limits.  Adrenals/Urinary Tract: Adrenal glands are within normal limits.  Kidneys are within normal limits.  No hydronephrosis.  Bladder is partially obscured by streak artifact otherwise unremarkable.  Stomach/Bowel: Stomach is within normal limits.  No evidence bowel obstruction.  Colonic diverticulosis, without evidence of diverticulitis.  Vascular/Lymphatic: Aortic/iliac aneurysms, mildly increased, as above.  Atherosclerotic calcifications of the abdominal aorta and branch vessels.  No suspicious abdominopelvic lymphadenopathy.  Other: No abdominopelvic ascites.  Small fat containing periumbilical hernia.  Musculoskeletal: Degenerative changes of the visualized thoracolumbar spine.  Left hip arthroplasty.  Review of the MIP images confirms the above findings.  IMPRESSION: 4.4 cm infrarenal abdominal aortic aneurysm just above the iliac bifurcation, unchanged.  Bilateral common iliac artery aneurysms and right internal iliac artery aneurysm, measuring up to 5.2 cm, stable versus minimally increased.   Additional stable ancillary findings as above.   Electronically Signed   By: Charline Bills M.D.   On: 05/23/2014 18:50          Assessment/Plan:   81 y.o. male with  Active Problems:   1.   Acute gastroenteritis   Supportive Rx    IVFs,and Anti-emetics PRN   Clear Liquid Diet     2.   Hypotension   IVFs   Hold Anti-Hypertensives if SBP < 100   Check Cortisol level due to Prednisone Rx     3.   Renal failure (ARF), acute on chronic   Hold Lisinopril     4.   Rheumatoid arthritis   On Prednisone      5.   Coronary artery disease   stable     6.   AAA (abdominal aortic aneurysm) without rupture   Known, and is under surveillance     7.   Systolic and diastolic CHF, chronic   Monitor for signs of Fluid Overload     8.   Thrombocytopenia   Monitor Platelet Trend     8.   Anemia   Anemia Panel sent     9.   Microscopic hematuria   Urine C+S sent   10.   DVT Prophylaxis   Lovenox   Monitor Platelet Trend        Code Status:     FULL CODE      Family Communication:    No Family Present    Disposition Plan:    Observation Status        Time spent:  3 Minutes      Ron Parker [facility] Pager [phone]   If 7AM -7PM Please Contact the Day Rounding Team MD for [facility]  If 7PM-7AM, Please Contact Night-Floor Coverage  [email or web address] Password [email or web address] 05/23/2014, 8:01 PM     ADDENDUM:   Patient was seen and examined on 05/23/2014

## 2014-05-23 NOTE — ED Provider Notes (Signed)
[email or web address]: [id number]     Arrival date & time 05/23/14  1716 History   First MD Initiated Contact with Patient 05/23/14 1724     Chief Complaint  Patient presents with  . Hypotension     (Consider location/radiation/quality/duration/timing/severity/associated sxs/prior Treatment) The history is provided by the patient.  Hunter Choi is a 81 y.o. male hx of HTN, GERD, severe aortic stenosis, AAA here presenting with nausea vomiting, abdominal pain. He has been having profuse vomiting for the last 2 days. Also has some epigastric pain and 2 episodes of diarrhea. Denies any chest pain or shortness of breath. He went to his primary care doctor today and was noted to have a blood pressure in the 70s and was sent for evaluation. Of note he has a known AAA and had an ultrasound done a month ago that was stable.    Past Medical History  Diagnosis Date  . Hypertension   . GERD (gastroesophageal reflux disease)   . Thyroid disease     HYPOTHYROIDISM  . Aortic stenosis, severe   . Embolism involving retinal artery   . BPH (benign prostatic hypertrophy)     Dr. Retta Diones  . Iliac artery aneurysm     Dr. Edilia Bo  . AAA (abdominal aortic aneurysm)     Dr. Ashley Royalty  . Diastolic heart failure   . 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 Coronary artery disease     Left main disease and severe 3-vessel CAD  . CAD (coronary artery disease) of bypass graft     Chronic occlusion of saphenous vein graft to RCA  . Hypothyroidism   . Shortness of breath   . Heart murmur   . Stroke     mini stroke effective his eye left  . Arthritis   . 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 Atrial fibrillation    Past Surgical History  Procedure Laterality Date  . Left hip hemiarthropasty      AFTER HIP FX   . Coronary artery bypass graft      1999..Dr. Sheliah Plane  . Total knee arthroplasty      right 1992   left  2002  . Biopsy prostate      2005  . Prostate surgery  2012    TURP  . Joint replacement  2007    THR  . Harvest bone graft  1982    FOR THE  ANKLE  . Skin grafts      1968  . Colonoscopy    . Toe surgery Bilateral   . Transcatheter aortic valve replacement, transapical N/A 01/15/2013    Procedure: TRANSCATHETER AORTIC VALVE REPLACEMENT, TRANSAPICAL;  Surgeon: [name], MD;  Location: [facility] OR;  Service: Open Heart Surgery;  Laterality: N/A;  . Intraoperative transesophageal echocardiogram N/A 01/15/2013    Procedure: INTRAOPERATIVE TRANSESOPHAGEAL ECHOCARDIOGRAM;  Surgeon: [name], MD;  Location: [facility] OR;  Service: Open Heart Surgery;  Laterality: N/A;  . Carotid endarterectomy  Jan. 2, 1997  . Left and right heart catheterization with coronary angiogram N/A 12/20/2012    Procedure: LEFT AND RIGHT HEART CATHETERIZATION WITH CORONARY ANGIOGRAM;  Surgeon: Lesleigh Noe, MD;  Location: [facility] CATH LAB;  Service: Cardiovascular;  Laterality: N/A;   Family History  Problem Relation Age of Onset  . Heart attack Mother   . Heart disease Mother     Before age 11  . Hyperlipidemia Mother   . Hypertension Mother   . Hypertension Son   . Heart disease Father     AAA-   . Hyperlipidemia Father   . Heart attack Father   . Hypertension Father   . Heart disease Brother     Before age 45  . Hyperlipidemia Brother   . Heart attack Brother   . Hypertension Brother    History  Substance Use Topics  . Smoking status: Former Smoker -- 0.50 packs/day for 47 years    Types: Cigarettes    Quit date: 03/28/1958  . Smokeless tobacco: Former Neurosurgeon    Types: Chew     Comment: ONLY USED 2 YEARS  . Alcohol Use: No    Review of Systems  Gastrointestinal: Positive for vomiting and abdominal pain.  All other systems reviewed and are negative.     Allergies   Ciprofloxacin  Home Medications   Prior to Admission medications   Medication Sig Start Date End Date Taking? Authorizing Provider  acetaminophen (TYLENOL) 325 MG tablet Take 2 tablets (650 mg total) by mouth every 6 (six) hours as needed for pain. 01/21/13  Yes Donielle Margaretann Loveless, PA-C  amLODipine (NORVASC) 5 MG tablet Take 5 mg by mouth daily.   Yes Historical Provider, MD  aspirin EC 81 MG EC tablet Take 1 tablet (81 mg total) by mouth daily. 01/21/13  Yes Donielle Margaretann Loveless, PA-C  diazepam (VALIUM) 5 MG tablet Take 5 mg by mouth daily as needed for anxiety.   Yes Historical Provider, MD  folic acid (FOLVITE) 1 MG tablet Take 3 mg by mouth daily.   Yes Historical Provider, MD  levothyroxine (SYNTHROID, LEVOTHROID) 25 MCG tablet Take 25 mcg by mouth 4 (four) times a week. Monday, Wednesday,Friday, and Saturday   Yes Historical Provider, MD  methotrexate 2.5 MG tablet Take 25 mg by mouth once a week. Every Thursday   Yes Historical Provider, MD  metoprolol tartrate (LOPRESSOR) 25 MG tablet Take 1 tablet (25 mg total) by mouth daily. 01/21/13  Yes Donielle Margaretann Loveless, PA-C  Multiple Vitamins-Minerals (OCUVITE PO) Take 1 tablet by mouth daily.   Yes Historical Provider, MD  omeprazole (PRILOSEC) 20 MG capsule Take 20 mg by mouth daily.   Yes Historical Provider, MD  predniSONE (DELTASONE) 5 MG tablet Take 5 mg by mouth every other day.    Yes Historical Provider, MD  simvastatin (ZOCOR) 20 MG tablet Take 20 mg by mouth daily at 6 PM.    Yes Historical Provider, MD  feeding supplement, ENSURE COMPLETE, (ENSURE COMPLETE) LIQD Take 237 mLs by mouth 2 (two) times daily between meals. Patient not taking: Reported on 05/23/2014 02/22/14   Nishant Dhungel, MD  lisinopril (PRINIVIL,ZESTRIL) 5 MG tablet Take 1 tablet (5 mg total) by mouth daily. Patient not taking: Reported on 05/23/2014 02/22/14   Nishant Dhungel, MD   BP 123/58 mmHg  Pulse 69  Temp(Src) 97.8 F (36.6 C) (Oral)  Resp 16  SpO2  95% Physical Exam  Constitutional: He is oriented to person, place, and time.  Dehydrated   HENT:  Head: Normocephalic.  MM slightly dry   Eyes: Conjunctivae and EOM are normal. Pupils are equal, round, and reactive to light.  Neck: Normal range of motion. Neck supple.  Cardiovascular: Normal rate, regular rhythm and normal heart sounds.   Pulmonary/Chest: Effort  normal and breath sounds normal. No respiratory distress. He has no wheezes. He has no rales.  Abdominal: Soft. Bowel sounds are normal.  + hernia epigastric area, reducible. ? Pulsatile mass.   Musculoskeletal: Normal range of motion. He exhibits no edema or tenderness.  Neurological: He is alert and oriented to person, place, and time. No cranial nerve deficit. Coordination normal.  Skin: Skin is warm and dry.  Psychiatric: He has a normal mood and affect. His behavior is normal. Judgment and thought content normal.  Nursing note and vitals reviewed.   ED Course  Procedures (including critical care time) Labs Review Labs Reviewed  CBC WITH DIFFERENTIAL/PLATELET - Abnormal; Notable for the following:    WBC 13.4 (*)    RBC 4.01 (*)    Hemoglobin 11.7 (*)    HCT 36.7 (*)    RDW 17.3 (*)    Platelets 148 (*)    Neutro Abs 8.9 (*)    Monocytes Relative 13 (*)    Monocytes Absolute 1.7 (*)    All other components within normal limits  COMPREHENSIVE METABOLIC PANEL - Abnormal; Notable for the following:    Sodium 134 (*)    Chloride 95 (*)    Glucose, Bld 122 (*)    BUN 36 (*)    Creatinine, Ser 1.76 (*)    GFR calc non Af Amer 34 (*)    GFR calc Af Amer 39 (*)    All other components within normal limits  I-STAT CG4 LACTIC ACID, ED - Abnormal; Notable for the following:    Lactic Acid, Venous 2.62 (*)    All other components within normal limits  I-STAT CHEM 8, ED - Abnormal; Notable for the following:    Chloride 95 (*)    BUN 39 (*)    Creatinine, Ser 1.70 (*)    Glucose, Bld 119 (*)    Calcium, Ion 1.11  (*)    All other components within normal limits  LIPASE, BLOOD  URINALYSIS, ROUTINE W REFLEX MICROSCOPIC  I-STAT TROPOININ, ED    Imaging Review Ct Cta Abd/pel W/cm &/or W/o Cm  05/23/2014   CLINICAL DATA:  Hypertension, history of AAA  EXAM: CTA ABDOMEN AND PELVIS WITHOUT AND WITH CONTRAST  TECHNIQUE: Multidetector CT imaging of the abdomen and pelvis was performed using the standard protocol during bolus administration of intravenous contrast. Multiplanar reconstructed images and MIPs were obtained and reviewed to evaluate the vascular anatomy.  CONTRAST:  28mL OMNIPAQUE IOHEXOL 350 MG/ML SOLN  COMPARISON:  12/26/2012  FINDINGS: Infrarenal abdominal aortic aneurysm just above the iliac bifurcation, measuring 4.4 x 4.1 cm (series 501/image 123), previously 4.4 x 4.2 cm.  4.1 cm left common iliac artery aneurysm (series 501/image 144), previously 4.0 cm.  5.2 cm right common iliac artery aneurysm (series 501/image 153), previously 5.1 cm.  4.3 cm right internal iliac artery aneurysm (series 501/image 167), previously 4.2 cm.  Lower chest: Subpleural reticulation/dependent atelectasis the lung bases.  Cardiomegaly. Prosthetic aortic valve. Postsurgical changes related to prior CABG.  Hepatobiliary: Liver is within normal limits.  Gallbladder is unremarkable. No intrahepatic or extrahepatic ductal dilatation.  Pancreas: Within normal limits.  Spleen: Within normal limits.  Adrenals/Urinary Tract: Adrenal glands are within normal limits.  Kidneys are within normal limits.  No hydronephrosis.  Bladder is partially obscured by streak artifact otherwise unremarkable.  Stomach/Bowel: Stomach is within normal limits.  No evidence bowel obstruction.  Colonic diverticulosis, without evidence of diverticulitis.  Vascular/Lymphatic: Aortic/iliac aneurysms, mildly increased, as above.  Atherosclerotic calcifications of the abdominal aorta and branch vessels.  No suspicious abdominopelvic lymphadenopathy.  Other: No  abdominopelvic ascites.  Small fat containing periumbilical hernia.  Musculoskeletal: Degenerative changes of the visualized thoracolumbar spine.  Left hip arthroplasty.  Review of the MIP images confirms the above findings.  IMPRESSION: 4.4 cm infrarenal abdominal aortic aneurysm just above the iliac bifurcation, unchanged.  Bilateral common iliac artery aneurysms and right internal iliac artery aneurysm, measuring up to 5.2 cm, stable versus minimally increased.  Additional stable ancillary findings as above.   Electronically Signed   By: Charline Bills M.D.   On: 05/23/2014 18:50     EKG Interpretation   Date/Time:  Friday May 23 2014 17:36:01 EST Ventricular Rate:  72 PR Interval:  272 QRS Duration: 147 QT Interval:  422 QTC Calculation: 462 R Axis:   -16 Text Interpretation:  Sinus rhythm Multiple ventricular premature  complexes Prolonged PR interval Left bundle branch block No significant  change since last tracing Confirmed by Schuyler Behan  MD, Ivana Nicastro ([id number]) on 05/23/2014  5:55:29 PM      MDM   Final diagnoses:  None    Hunter Choi is a 81 y.o. male here with ab pain, hypotension, vomiting. Given known AAA with hypotension, will get CT to r/o rupture. But likely dehydrated from vomiting.   7:13 PM Cr. 1.7, baseline 1.1. Lactate elevated. AAA stable with no rupture. Not hypotensive after IVF. Will admit for hydration.     Richardean Canal, MD 05/23/14 [phone]

## 2014-05-23 NOTE — ED Notes (Signed)
Dr Silverio Lay given a copy of lactic acid results 2.62

## 2014-05-24 ENCOUNTER — Encounter (HOSPITAL_COMMUNITY): Payer: Self-pay

## 2014-05-24 LAB — RETICULOCYTES
RBC.: 3.42 MIL/uL — ABNORMAL LOW (ref 4.22–5.81)
RETIC CT PCT: 1.1 % (ref 0.4–3.1)
Retic Count, Absolute: 37.6 10*3/uL (ref 19.0–186.0)

## 2014-05-24 LAB — CBC
HEMATOCRIT: 32 % — AB (ref 39.0–52.0)
Hemoglobin: 10.3 g/dL — ABNORMAL LOW (ref 13.0–17.0)
MCH: 30.1 pg (ref 26.0–34.0)
MCHC: 32.2 g/dL (ref 30.0–36.0)
MCV: 93.6 fL (ref 78.0–100.0)
Platelets: 109 10*3/uL — ABNORMAL LOW (ref 150–400)
RBC: 3.42 MIL/uL — ABNORMAL LOW (ref 4.22–5.81)
RDW: 17.5 % — ABNORMAL HIGH (ref 11.5–15.5)
WBC: 7.1 10*3/uL (ref 4.0–10.5)

## 2014-05-24 LAB — BASIC METABOLIC PANEL
Anion gap: 6 (ref 5–15)
BUN: 24 mg/dL — ABNORMAL HIGH (ref 6–23)
CALCIUM: 7.8 mg/dL — AB (ref 8.4–10.5)
CHLORIDE: 102 mmol/L (ref 96–112)
CO2: 24 mmol/L (ref 19–32)
Creatinine, Ser: 0.92 mg/dL (ref 0.50–1.35)
GFR calc non Af Amer: 75 mL/min — ABNORMAL LOW (ref >90)
GFR, EST AFRICAN AMERICAN: 87 mL/min — AB (ref >90)
Glucose, Bld: 89 mg/dL (ref 70–99)
POTASSIUM: 3.5 mmol/L (ref 3.5–5.1)
SODIUM: 132 mmol/L — AB (ref 135–145)

## 2014-05-24 LAB — TSH: TSH: 1.497 u[IU]/mL (ref 0.350–4.500)

## 2014-05-24 MED ORDER — OCUVITE PO TABS
1.0000 | ORAL_TABLET | Freq: Every day | ORAL | Status: DC
  Administered 2014-05-25: 1 via ORAL
  Filled 2014-05-24 (×2): qty 1

## 2014-05-24 MED ORDER — POTASSIUM CHLORIDE 20 MEQ/15ML (10%) PO SOLN
40.0000 meq | Freq: Once | ORAL | Status: AC
  Administered 2014-05-24: 40 meq via ORAL
  Filled 2014-05-24: qty 30

## 2014-05-24 MED ORDER — ASPIRIN EC 81 MG PO TBEC
81.0000 mg | DELAYED_RELEASE_TABLET | Freq: Every day | ORAL | Status: DC
  Administered 2014-05-25: 81 mg via ORAL
  Filled 2014-05-24 (×2): qty 1

## 2014-05-24 MED ORDER — FOLIC ACID 1 MG PO TABS
3.0000 mg | ORAL_TABLET | Freq: Every day | ORAL | Status: DC
  Administered 2014-05-25: 3 mg via ORAL
  Filled 2014-05-24 (×2): qty 3

## 2014-05-24 MED ORDER — PANTOPRAZOLE SODIUM 40 MG PO TBEC
40.0000 mg | DELAYED_RELEASE_TABLET | Freq: Every day | ORAL | Status: DC
  Administered 2014-05-25: 40 mg via ORAL
  Filled 2014-05-24: qty 1

## 2014-05-24 NOTE — Progress Notes (Signed)
Utilization Review completed.  

## 2014-05-24 NOTE — Progress Notes (Signed)
[facility] PROGRESS NOTE  Hunter Choi NWG:[id number] DOB: 1927/07/10 DOA: 05/23/2014 PCP: Kristie Cowman, MD  Summary I have seen and examined Hunter Choi at bedside and reviewed his chart. I attempted to contact family members listed in the chart but without success as the numbers apparently out of service. Unfortunately, patient does not give a meaningful history apparently because he is hard of hearing. Hunter Choi is a pleasant 81 y.o. male with a history of CAD, Rheumatoid Arthritis, HTN, Severe Aortic Stenosis S/P TAVR, Diastolic CHF, AAA, Hypothyroid, and BPH who was referred to the ED by his PCP after he had been seen in the office for complaints of nausea and vomiting x 2 days and he was admitted with the diagnosis of acute gastroenteritis resulting in acute on chronic renal failure. He was hypotensive and had leukocytosis of 13,400. However, CT abdomen and pelvis was unrevealing. Patient had been on ACE inhibitor which probably contributed to the renal insufficiency. The concern is that he may have partially treated infection as patient was admitted for enterococcus sepsis in November 2015. Immunocompromise related to methotrexate/prednisone makes for high index of suspicion for infection. UA here is unremarkable. He was placed on IV fluids and renal function has improved from creatinine of 1.7 to 0.92 today. Given the back-to-back kidney injury, he would not be a great candidate for long-term ACE inhibitor. He reports that he feels 100% better. Will continue IV fluids and check for any evidence of infection overnight. His white count has improved from 13,400 to 7100 with hydration alone, increasing the probability that he just had acute viral gastroenteritis which is self-limiting. If he continues to do well would then discharge home in the next 24-48 hours. Plan Renal failure (ARF), acute on chronic/Acute gastroenteritis/Hypotension  Clinically better  Continue IV fluids  Avoid  nephrotoxic medications including ACE inhibitor Systolic and diastolic CHF, chronic/Rheumatoid arthritis/Coronary artery disease/AAA (abdominal aortic aneurysm) without rupture/Thrombocytopenia/Anemia/Microscopic hematuria  No acute changes  Monitor Code Status: Full Family Communication: Could not reach family by the listed phone numbers-patient does not remember his home phone number. Disposition Plan: ?Home in the next 24-48 hours if he does well.   Consultants:  None  Procedures:  None  Antibiotics:  None  HPI/Subjective: Feels 100% better.  Objective: Filed Vitals:   05/24/14 1000  BP: 122/71  Pulse: 76  Temp: 98.4 F (36.9 C)  Resp: 18    Intake/Output Summary (Last 24 hours) at 05/24/14 1412 Last data filed at 05/24/14 1300  Gross per 24 hour  Intake 2009.58 ml  Output      0 ml  Net 2009.58 ml   Filed Weights   05/23/14 2046  Weight: 65.9 kg (145 lb 4.5 oz)    Exam:   General:  Comfortable at rest.  Cardiovascular: S1-S2 normal. No murmurs. Pulse regular.  Respiratory: Good air entry bilaterally. No rhonchi or rales.  Abdomen: Soft and nontender. Normal bowel sounds. No organomegaly.  Musculoskeletal: No pedal edema   Neurological: Intact  Data Reviewed: Basic Metabolic Panel:  Recent Labs Lab 05/23/14 1729 05/23/14 1750 05/24/14 0614  NA 134* 135 132*  K 4.2 4.1 3.5  CL 95* 95* 102  CO2 24  --  24  GLUCOSE 122* 119* 89  BUN 36* 39* 24*  CREATININE 1.76* 1.70* 0.92  CALCIUM 9.2  --  7.8*   Liver Function Tests:  Recent Labs Lab 05/23/14 1729  AST 25  ALT 11  ALKPHOS 64  BILITOT 0.8  PROT  7.2  ALBUMIN 4.0    Recent Labs Lab 05/23/14 1729  LIPASE 19   No results for input(s): AMMONIA in the last 168 hours. CBC:  Recent Labs Lab 05/23/14 1729 05/23/14 1750 05/24/14 0614  WBC 13.4*  --  7.1  NEUTROABS 8.9*  --   --   HGB 11.7* 14.3 10.3*  HCT 36.7* 42.0 32.0*  MCV 91.5  --  93.6  PLT 148*  --  109*    Cardiac Enzymes: No results for input(s): CKTOTAL, CKMB, CKMBINDEX, TROPONINI in the last 168 hours. BNP (last 3 results) No results for input(s): BNP in the last 8760 hours.  ProBNP (last 3 results) No results for input(s): PROBNP in the last 8760 hours.  CBG: No results for input(s): GLUCAP in the last 168 hours.  No results found for this or any previous visit (from the past 240 hour(s)).   Studies: Ct Cta Abd/pel W/cm &/or W/o Cm  05/23/2014   CLINICAL DATA:  Hypertension, history of AAA  EXAM: CTA ABDOMEN AND PELVIS WITHOUT AND WITH CONTRAST  TECHNIQUE: Multidetector CT imaging of the abdomen and pelvis was performed using the standard protocol during bolus administration of intravenous contrast. Multiplanar reconstructed images and MIPs were obtained and reviewed to evaluate the vascular anatomy.  CONTRAST:  57mL OMNIPAQUE IOHEXOL 350 MG/ML SOLN  COMPARISON:  12/26/2012  FINDINGS: Infrarenal abdominal aortic aneurysm just above the iliac bifurcation, measuring 4.4 x 4.1 cm (series 501/image 123), previously 4.4 x 4.2 cm.  4.1 cm left common iliac artery aneurysm (series 501/image 144), previously 4.0 cm.  5.2 cm right common iliac artery aneurysm (series 501/image 153), previously 5.1 cm.  4.3 cm right internal iliac artery aneurysm (series 501/image 167), previously 4.2 cm.  Lower chest: Subpleural reticulation/dependent atelectasis the lung bases.  Cardiomegaly. Prosthetic aortic valve. Postsurgical changes related to prior CABG.  Hepatobiliary: Liver is within normal limits.  Gallbladder is unremarkable. No intrahepatic or extrahepatic ductal dilatation.  Pancreas: Within normal limits.  Spleen: Within normal limits.  Adrenals/Urinary Tract: Adrenal glands are within normal limits.  Kidneys are within normal limits.  No hydronephrosis.  Bladder is partially obscured by streak artifact otherwise unremarkable.  Stomach/Bowel: Stomach is within normal limits.  No evidence bowel obstruction.   Colonic diverticulosis, without evidence of diverticulitis.  Vascular/Lymphatic: Aortic/iliac aneurysms, mildly increased, as above.  Atherosclerotic calcifications of the abdominal aorta and branch vessels.  No suspicious abdominopelvic lymphadenopathy.  Other: No abdominopelvic ascites.  Small fat containing periumbilical hernia.  Musculoskeletal: Degenerative changes of the visualized thoracolumbar spine.  Left hip arthroplasty.  Review of the MIP images confirms the above findings.  IMPRESSION: 4.4 cm infrarenal abdominal aortic aneurysm just above the iliac bifurcation, unchanged.  Bilateral common iliac artery aneurysms and right internal iliac artery aneurysm, measuring up to 5.2 cm, stable versus minimally increased.  Additional stable ancillary findings as above.   Electronically Signed   By: Charline Bills M.D.   On: 05/23/2014 18:50    Scheduled Meds: . [START ON 05/25/2014] aspirin EC  81 mg Oral Daily  . [START ON 05/25/2014] beta carotene w/minerals  1 tablet Oral Daily  . enoxaparin (LOVENOX) injection  30 mg Subcutaneous Q24H  . [START ON 05/25/2014] folic acid  3 mg Oral Daily  . levothyroxine  25 mcg Oral Once per day on Mon Wed Fri Sat  . [START ON 05/25/2014] pantoprazole  40 mg Oral Daily  . potassium chloride  40 mEq Oral Once  . predniSONE  5 mg  Oral QODAY  . simvastatin  20 mg Oral q1800   Continuous Infusions: . sodium chloride 75 mL/hr at 05/24/14 0700     Time spent: 25 minutes    Hunter Choi  [facility] Pager [phone]. If 7PM-7AM, please contact night-coverage at [email or web address], password [facility] 05/24/2014, 2:12 PM

## 2014-05-25 LAB — CBC
HEMATOCRIT: 32.2 % — AB (ref 39.0–52.0)
Hemoglobin: 10.6 g/dL — ABNORMAL LOW (ref 13.0–17.0)
MCH: 30.3 pg (ref 26.0–34.0)
MCHC: 32.9 g/dL (ref 30.0–36.0)
MCV: 92 fL (ref 78.0–100.0)
Platelets: 113 10*3/uL — ABNORMAL LOW (ref 150–400)
RBC: 3.5 MIL/uL — AB (ref 4.22–5.81)
RDW: 17.1 % — AB (ref 11.5–15.5)
WBC: 7.4 10*3/uL (ref 4.0–10.5)

## 2014-05-25 LAB — COMPREHENSIVE METABOLIC PANEL
ALBUMIN: 2.9 g/dL — AB (ref 3.5–5.2)
ALT: 9 U/L (ref 0–53)
AST: 20 U/L (ref 0–37)
Alkaline Phosphatase: 48 U/L (ref 39–117)
Anion gap: 9 (ref 5–15)
BUN: 13 mg/dL (ref 6–23)
CO2: 23 mmol/L (ref 19–32)
Calcium: 8 mg/dL — ABNORMAL LOW (ref 8.4–10.5)
Chloride: 99 mmol/L (ref 96–112)
Creatinine, Ser: 0.72 mg/dL (ref 0.50–1.35)
GFR, EST NON AFRICAN AMERICAN: 83 mL/min — AB (ref >90)
GLUCOSE: 104 mg/dL — AB (ref 70–99)
Potassium: 3.6 mmol/L (ref 3.5–5.1)
SODIUM: 131 mmol/L — AB (ref 135–145)
Total Bilirubin: 0.8 mg/dL (ref 0.3–1.2)
Total Protein: 5.5 g/dL — ABNORMAL LOW (ref 6.0–8.3)

## 2014-05-25 LAB — CORTISOL-AM, BLOOD: Cortisol - AM: 6.4 ug/dL (ref 4.3–22.4)

## 2014-05-25 MED ORDER — AMLODIPINE BESYLATE 10 MG PO TABS: 10.0000 mg | ORAL_TABLET | Freq: Every day | ORAL | Status: DC

## 2014-05-25 MED ORDER — POTASSIUM CHLORIDE 20 MEQ/15ML (10%) PO SOLN
40.0000 meq | Freq: Once | ORAL | Status: AC
  Administered 2014-05-25: 40 meq via ORAL
  Filled 2014-05-25: qty 30

## 2014-05-25 NOTE — Discharge Summary (Signed)
Hunter Choi, is a 81 y.o. male  DOB 1927-04-18  MRN [id number].  Admission date:  05/23/2014  Admitting Physician  Ron Parker, MD  Discharge Date:  05/25/2014   Primary MD  Kristie Cowman, MD  Recommendations for primary care physician for things to follow:   Would avoid ACEI long term.   If evidence of infection, consider empiric treatment for Enterococcus.   Admission Diagnosis   Renal failure (ARF), acute on chronic [N17.9, N18.9] Hypotension, unspecified hypotension type [I95.9]   Discharge Diagnosis  Renal failure (ARF), acute on chronic [N17.9, N18.9] Hypotension, unspecified hypotension type [I95.9]   Active Problems:   Systolic and diastolic CHF, chronic   Rheumatoid arthritis   Coronary artery disease   AAA (abdominal aortic aneurysm) without rupture   Thrombocytopenia   Anemia   Microscopic hematuria      Hospital Course  Hunter Choi is a pleasant 81 y.o. male with a history of CAD, Rheumatoid Arthritis, HTN, Severe Aortic Stenosis S/P TAVR, Diastolic CHF, AAA, Hypothyroid, and BPH who was referred to the ED by his PCP after he had been seen in the office for complaints of nausea and vomiting x 2 days and he was admitted with the diagnosis of acute gastroenteritis resulting in acute on chronic renal failure. He was hypotensive and had leukocytosis of 13,400. However, CT abdomen and pelvis was unrevealing. Patient had been on ACE inhibitor which probably contributed to the renal insufficiency. The concern was that he might have partially treated infection as patient was admitted for enterococcus sepsis in November 2015. Immunocompromise related to methotrexate/prednisone makes for high index of suspicion for infection. UA here is unremarkable. He was placed on IV fluids and renal function  has improved from creatinine of 1.7 to 0.72 today. Given the back-to-back kidney injury, he would not be a great candidate for long-term ACE inhibitor. I therefore discontinued Lisinopril and increased Norvasc dose to 10mg  daily.  He reports that he feels 100% better and feels that he is at his baseline. His white count has improved from 13,400 to 7400 with hydration alone, increasing the probability that he just had acute viral gastroenteritis which is self-limiting. Will therefore discharge him home to follow with his PCP in 1 week.  Discharge Condition Stable.  Consults obtained  None  Follow UP PCP in 1 week.   Discharge Instructions  and  Discharge Medications  Discharge Instructions    Diet - low sodium heart healthy    Complete by:  As directed      Increase activity slowly    Complete by:  As directed             Medication List    STOP taking these medications        lisinopril 5 MG tablet  Commonly known as:  PRINIVIL,ZESTRIL      TAKE these medications        acetaminophen 325 MG tablet  Commonly known as:  TYLENOL  Take 2 tablets (650 mg total) by  mouth every 6 (six) hours as needed for pain.     amLODipine 10 MG tablet  Commonly known as:  NORVASC  Take 1 tablet (10 mg total) by mouth daily.     aspirin 81 MG EC tablet  Take 1 tablet (81 mg total) by mouth daily.     diazepam 5 MG tablet  Commonly known as:  VALIUM  Take 5 mg by mouth daily as needed for anxiety.     feeding supplement (ENSURE COMPLETE) Liqd  Take 237 mLs by mouth 2 (two) times daily between meals.     folic acid 1 MG tablet  Commonly known as:  FOLVITE  Take 3 mg by mouth daily.     levothyroxine 25 MCG tablet  Commonly known as:  SYNTHROID, LEVOTHROID  Take 25 mcg by mouth 4 (four) times a week. Monday, Wednesday,Friday, and Saturday     methotrexate 2.5 MG tablet  Take 25 mg by mouth once a week. Every Thursday     metoprolol tartrate 25 MG tablet  Commonly known as:   LOPRESSOR  Take 1 tablet (25 mg total) by mouth daily.     OCUVITE PO  Take 1 tablet by mouth daily.     omeprazole 20 MG capsule  Commonly known as:  PRILOSEC  Take 20 mg by mouth daily.     predniSONE 5 MG tablet  Commonly known as:  DELTASONE  Take 5 mg by mouth every other day.     simvastatin 20 MG tablet  Commonly known as:  ZOCOR  Take 20 mg by mouth daily at 6 PM.        Diet and Activity recommendation: See Discharge Instructions above  Major procedures and Radiology Reports - PLEASE review detailed and final reports for all details, in brief -    Ct Cta Abd/pel W/cm &/or W/o Cm  05/23/2014   CLINICAL DATA:  Hypertension, history of AAA  EXAM: CTA ABDOMEN AND PELVIS WITHOUT AND WITH CONTRAST  TECHNIQUE: Multidetector CT imaging of the abdomen and pelvis was performed using the standard protocol during bolus administration of intravenous contrast. Multiplanar reconstructed images and MIPs were obtained and reviewed to evaluate the vascular anatomy.  CONTRAST:  34mL OMNIPAQUE IOHEXOL 350 MG/ML SOLN  COMPARISON:  12/26/2012  FINDINGS: Infrarenal abdominal aortic aneurysm just above the iliac bifurcation, measuring 4.4 x 4.1 cm (series 501/image 123), previously 4.4 x 4.2 cm.  4.1 cm left common iliac artery aneurysm (series 501/image 144), previously 4.0 cm.  5.2 cm right common iliac artery aneurysm (series 501/image 153), previously 5.1 cm.  4.3 cm right internal iliac artery aneurysm (series 501/image 167), previously 4.2 cm.  Lower chest: Subpleural reticulation/dependent atelectasis the lung bases.  Cardiomegaly. Prosthetic aortic valve. Postsurgical changes related to prior CABG.  Hepatobiliary: Liver is within normal limits.  Gallbladder is unremarkable. No intrahepatic or extrahepatic ductal dilatation.  Pancreas: Within normal limits.  Spleen: Within normal limits.  Adrenals/Urinary Tract: Adrenal glands are within normal limits.  Kidneys are within normal limits.  No  hydronephrosis.  Bladder is partially obscured by streak artifact otherwise unremarkable.  Stomach/Bowel: Stomach is within normal limits.  No evidence bowel obstruction.  Colonic diverticulosis, without evidence of diverticulitis.  Vascular/Lymphatic: Aortic/iliac aneurysms, mildly increased, as above.  Atherosclerotic calcifications of the abdominal aorta and branch vessels.  No suspicious abdominopelvic lymphadenopathy.  Other: No abdominopelvic ascites.  Small fat containing periumbilical hernia.  Musculoskeletal: Degenerative changes of the visualized thoracolumbar spine.  Left hip arthroplasty.  Review  of the MIP images confirms the above findings.  IMPRESSION: 4.4 cm infrarenal abdominal aortic aneurysm just above the iliac bifurcation, unchanged.  Bilateral common iliac artery aneurysms and right internal iliac artery aneurysm, measuring up to 5.2 cm, stable versus minimally increased.  Additional stable ancillary findings as above.   Electronically Signed   By: Charline Bills M.D.   On: 05/23/2014 18:50    Micro Results   No results found for this or any previous visit (from the past 240 hour(s)).     Today   Subjective:   Hunter Choi today has no headache,no chest abdominal pain,no new weakness tingling or numbness, feels much better wants to go home today.   Objective:   Blood pressure 154/54, pulse 68, temperature 98.8 F (37.1 C), temperature source Oral, resp. rate 18, height 5' 4.5" (1.638 m), weight 68.7 kg (151 lb 7.3 oz), SpO2 96 %.   Intake/Output Summary (Last 24 hours) at 05/25/14 1430 Last data filed at 05/25/14 1300  Gross per 24 hour  Intake 2602.5 ml  Output   1750 ml  Net  852.5 ml    Exam Awake Alert, Oriented x 3, No new F.N deficits, Normal affect [place].AT,PERRAL Supple Neck,No JVD, No cervical lymphadenopathy appriciated.  Symmetrical Chest wall movement, Good air movement bilaterally, CTAB RRR,No Gallops,Rubs or new Murmurs, No Parasternal Heave +ve  B.Sounds, Abd Soft, Non tender, No organomegaly appriciated, No rebound -guarding or rigidity. No Cyanosis, Clubbing or edema, No new Rash or bruise  Data Review   CBC w Diff: Lab Results  Component Value Date   WBC 7.4 05/25/2014   HGB 10.6* 05/25/2014   HCT 32.2* 05/25/2014   PLT 113* 05/25/2014   LYMPHOPCT 20 05/23/2014   MONOPCT 13* 05/23/2014   EOSPCT 0 05/23/2014   BASOPCT 0 05/23/2014    CMP: Lab Results  Component Value Date   NA 131* 05/25/2014   K 3.6 05/25/2014   CL 99 05/25/2014   CO2 23 05/25/2014   BUN 13 05/25/2014   CREATININE 0.72 05/25/2014   PROT 5.5* 05/25/2014   ALBUMIN 2.9* 05/25/2014   BILITOT 0.8 05/25/2014   ALKPHOS 48 05/25/2014   AST 20 05/25/2014   ALT 9 05/25/2014  .   Total Time in preparing paper work, data evaluation and todays exam - 35 minutes  Bowden Boody M.D on 05/25/2014 at 2:30 PM  [facility] Office  [phone]

## 2014-05-25 NOTE — Progress Notes (Signed)
UR completed 

## 2014-05-25 NOTE — Progress Notes (Signed)
Patient Discharge: Disposition: Patient discharged home with family. Education: Patient's son educated about medications, prescriptions, follow-up appointments, and discharge instructions. IV: Discontinued before discharge. Telemetry: Discontinued before discharge, CCMD notified. Transportation: Patient transported in w/c with the family. Belongings: Patient took all his belongings with him.

## 2014-05-26 LAB — VITAMIN B12: Vitamin B-12: 261 pg/mL (ref 211–911)

## 2014-05-26 LAB — FERRITIN: Ferritin: 121 ng/mL (ref 22–322)

## 2014-05-26 LAB — IRON AND TIBC
Iron: 20 ug/dL — ABNORMAL LOW (ref 42–165)
Saturation Ratios: 9 % — ABNORMAL LOW (ref 20–55)
TIBC: 234 ug/dL (ref 215–435)
UIBC: 214 ug/dL (ref 125–400)

## 2014-05-26 LAB — FOLATE: Folate: >20 ng/mL

## 2014-09-01 ENCOUNTER — Encounter: Payer: Self-pay | Admitting: Family

## 2014-09-02 ENCOUNTER — Encounter: Payer: Self-pay | Admitting: Family

## 2014-09-03 ENCOUNTER — Ambulatory Visit (INDEPENDENT_AMBULATORY_CARE_PROVIDER_SITE_OTHER)
Admission: RE | Admit: 2014-09-03 | Discharge: 2014-09-03 | Disposition: A | Discharge status: Home / Self Care | Payer: Medicare Other | Source: Ambulatory Visit | Attending: Vascular Surgery | Admitting: Vascular Surgery

## 2014-09-03 ENCOUNTER — Ambulatory Visit (INDEPENDENT_AMBULATORY_CARE_PROVIDER_SITE_OTHER): Payer: Medicare Other | Admitting: Family

## 2014-09-03 ENCOUNTER — Encounter: Payer: Self-pay | Admitting: Family

## 2014-09-03 ENCOUNTER — Ambulatory Visit (HOSPITAL_COMMUNITY)
Admission: RE | Admit: 2014-09-03 | Discharge: 2014-09-03 | Disposition: A | Discharge status: Home / Self Care | Payer: Medicare Other | Source: Ambulatory Visit | Attending: Family | Admitting: Family

## 2014-09-03 VITALS — BP 129/56 | HR 56 | Resp 14 | Ht 65.0 in | Wt 154.0 lb

## 2014-09-03 DIAGNOSIS — Z9889 Other specified postprocedural states: Secondary | ICD-10-CM

## 2014-09-03 DIAGNOSIS — I714 Abdominal aortic aneurysm, without rupture, unspecified: Secondary | ICD-10-CM

## 2014-09-03 DIAGNOSIS — I723 Aneurysm of iliac artery: Secondary | ICD-10-CM

## 2014-09-03 DIAGNOSIS — I700 Atherosclerosis of aorta: Secondary | ICD-10-CM | POA: Insufficient documentation

## 2014-09-03 DIAGNOSIS — I6522 Occlusion and stenosis of left carotid artery: Secondary | ICD-10-CM | POA: Diagnosis not present

## 2014-09-03 NOTE — Patient Instructions (Signed)

## 2014-09-03 NOTE — Progress Notes (Signed)
[facility] [place] HISTORY AND PHYSICAL   MRN : [id number]  History of Present Illness:   Hunter Choi is a 81 y.o. male patient that Dr. Edilia Choi has been following for an abdominal aortic aneurysm and bilateral common iliac artery aneurysms. He also has a history of right carotid endarterectomy in 1997, he has a known occlusion of the left internal carotid artery. He returns today for follow up of AAA and iliac artery aneurysms, and carotid Duplex.  His aneurysms had been stable in size over several years and we were continuing his follow up at yearly intervals. However he was then lost to follow up. This patient presented with severe aortic stenosis. He underwent transcatheter aortic valve replacement using a trans-apical approach by Dr. Laneta Choi and Dr. Excell Choi on 01/15/13. His CT scan in October prior to his procedure showed that his abdominal aorta are measured 4.7 cm at the level of the bifurcation. The right common iliac artery measured 4.6 cm in maximum diameter and the left common iliac artery 3.8 centimeters in maximum diameter. Back in 2010, the distal aorta measured 3.7 cm in maximum diameter. The right common iliac artery measured 3.7 cm in maximum diameter. The left common iliac artery measured 3.5 cm in maximum diameter.  The patient has done amazingly well since his aortic valve replacement. He denies any abdominal pain or back pain. His shortness of breath improved significantly after surgery.  He had a mild stroke as manifested by left eye loss of vision to some degree, just before his 1997 right CEA, no stroke or TIA activity since then.  He denies any known history of hemiparesis or aphasia. He denies any unusual back or abdominal pain, he does report chronic intermittent mid back pain, denies sciatic type pain. He uses a walker since he fell and fractured his left hip which was repaired about 1995. He takes a daily ASA, statin, and beta blocker.  He  was hospitalized at [facility] for "kidney poisoning and vomiting".  He has macular degeneration in his right eye.   He sees a podiatrist every 3-4 months.   Pt Diabetic: No Pt smoker: former smoker, quit in 1961   On 03/06/13 Dr. Adele Choi progress note assessment was as follows: This patient has a 4.7 cm distal infrarenal abdominal aortic aneurysm, a 4.7 cm right common iliac artery aneurysm, and a 3.8 cm left common iliac artery aneurysm, and bilateral hypogastric artery aneurysms. Given his age and medical comorbidities, he is not a good candidate for open repair of his aneurysms. Likewise, as described above he did not appear to be a good candidate for EVAR as this would require exclusion of both hypogastric arteries. Fortunately the aneurysms have only enlarged slightly over the last 4 years. I have recommended a conservative approach. The son who is with him today he is in agreement with that plan.     Current Outpatient Prescriptions  Medication Sig Dispense Refill  . acetaminophen (TYLENOL) 325 MG tablet Take 2 tablets (650 mg total) by mouth every 6 (six) hours as needed for pain.    Marland Kitchen amLODipine (NORVASC) 10 MG tablet Take 1 tablet (10 mg total) by mouth daily. 30 tablet 1  . aspirin EC 81 MG EC tablet Take 1 tablet (81 mg total) by mouth daily.    . diazepam (VALIUM) 5 MG tablet Take 5 mg by mouth daily as needed for anxiety.    . folic acid (FOLVITE) 1 MG tablet Take 3 mg by mouth  daily.    . levothyroxine (SYNTHROID, LEVOTHROID) 25 MCG tablet Take 25 mcg by mouth 4 (four) times a week. Monday, Wednesday,Friday, and Saturday    . methotrexate 2.5 MG tablet Take 25 mg by mouth once a week. Every Thursday    . metoprolol tartrate (LOPRESSOR) 25 MG tablet Take 1 tablet (25 mg total) by mouth daily. 60 tablet 1  . Multiple Vitamins-Minerals (OCUVITE PO) Take 1 tablet by mouth daily.    Marland Kitchen omeprazole (PRILOSEC) 20 MG capsule Take 20 mg by mouth daily.    . predniSONE (DELTASONE) 5 MG  tablet Take 5 mg by mouth every other day.     . simvastatin (ZOCOR) 20 MG tablet Take 20 mg by mouth daily at 6 PM.     . feeding supplement, ENSURE COMPLETE, (ENSURE COMPLETE) LIQD Take 237 mLs by mouth 2 (two) times daily between meals. (Patient not taking: Reported on 05/23/2014) 60 Bottle 0   No current facility-administered medications for this visit.    Past Medical History  Diagnosis Date  . Hypertension   . GERD (gastroesophageal reflux disease)   . Thyroid disease     HYPOTHYROIDISM  . Aortic stenosis, severe   . Embolism involving retinal artery   . BPH (benign prostatic hypertrophy)     Hunter Choi  . Iliac artery aneurysm     Dr. Edilia Choi  . AAA (abdominal aortic aneurysm)     Dr. Ashley Choi  . Diastolic heart failure   . 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 Coronary artery disease     Left main disease and severe 3-vessel CAD  . CAD (coronary artery disease) of bypass graft     Chronic occlusion of saphenous vein graft to RCA  . Hypothyroidism   . Shortness of breath   . Heart murmur   . Stroke     mini stroke effective his eye left  . Arthritis   . 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 Atrial fibrillation     Social History History  Substance Use Topics  . Smoking status: Former Smoker -- 0.50 packs/day for 47 years    Types: Cigarettes    Quit date: 03/28/1958  . Smokeless tobacco: Former Neurosurgeon    Types: Chew     Comment: ONLY USED 2 YEARS  . Alcohol Use: No    Family History Family History  Problem Relation Age of Onset  . Heart attack Mother   . Heart disease Mother     Before age 51  . Hyperlipidemia Mother   . Hypertension Mother   . Hypertension Son   . Heart disease Father     AAA-   . Hyperlipidemia Father   . Heart attack Father    . Hypertension Father   . Heart disease Brother     Before age 74  . Hyperlipidemia Brother   . Heart attack Brother   . Hypertension Brother     Surgical History Past Surgical History  Procedure Laterality Date  . Left hip hemiarthropasty      AFTER HIP FX  . Coronary artery bypass graft      1999..Dr. Sheliah Plane  . Total knee arthroplasty      right 1992   left  2002  .  Biopsy prostate      2005  . Prostate surgery  2012    TURP  . Joint replacement  2007    THR  . Harvest bone graft  1982    FOR THE  ANKLE  . Skin grafts      1968  . Colonoscopy    . Toe surgery Bilateral   . Transcatheter aortic valve replacement, transapical N/A 01/15/2013    Procedure: TRANSCATHETER AORTIC VALVE REPLACEMENT, TRANSAPICAL;  Surgeon: [name], MD;  Location: [facility] OR;  Service: Open Heart Surgery;  Laterality: N/A;  . Intraoperative transesophageal echocardiogram N/A 01/15/2013    Procedure: INTRAOPERATIVE TRANSESOPHAGEAL ECHOCARDIOGRAM;  Surgeon: [name], MD;  Location: [facility] OR;  Service: Open Heart Surgery;  Laterality: N/A;  . Left and right heart catheterization with coronary angiogram N/A 12/20/2012    Procedure: LEFT AND RIGHT HEART CATHETERIZATION WITH CORONARY ANGIOGRAM;  Surgeon: Lesleigh Noe, MD;  Location: [facility] CATH LAB;  Service: Cardiovascular;  Laterality: N/A;  . Carotid endarterectomy Right Jan. 2, 1997    CE    Allergies  Allergen Reactions  . Ciprofloxacin Other (See Comments)    REACTION: mild delirium    Current Outpatient Prescriptions  Medication Sig Dispense Refill  . acetaminophen (TYLENOL) 325 MG tablet Take 2 tablets (650 mg total) by mouth every 6 (six) hours as needed for pain.    Marland Kitchen amLODipine (NORVASC) 10 MG tablet Take 1 tablet (10 mg total) by mouth daily. 30 tablet 1  . aspirin EC 81 MG EC tablet Take 1 tablet (81 mg total) by mouth daily.    . diazepam (VALIUM) 5 MG tablet Take 5 mg by mouth daily as needed for anxiety.    . folic  acid (FOLVITE) 1 MG tablet Take 3 mg by mouth daily.    Marland Kitchen levothyroxine (SYNTHROID, LEVOTHROID) 25 MCG tablet Take 25 mcg by mouth 4 (four) times a week. Monday, Wednesday,Friday, and Saturday    . methotrexate 2.5 MG tablet Take 25 mg by mouth once a week. Every Thursday    . metoprolol tartrate (LOPRESSOR) 25 MG tablet Take 1 tablet (25 mg total) by mouth daily. 60 tablet 1  . Multiple Vitamins-Minerals (OCUVITE PO) Take 1 tablet by mouth daily.    Marland Kitchen omeprazole (PRILOSEC) 20 MG capsule Take 20 mg by mouth daily.    . predniSONE (DELTASONE) 5 MG tablet Take 5 mg by mouth every other day.     . simvastatin (ZOCOR) 20 MG tablet Take 20 mg by mouth daily at 6 PM.     . feeding supplement, ENSURE COMPLETE, (ENSURE COMPLETE) LIQD Take 237 mLs by mouth 2 (two) times daily between meals. (Patient not taking: Reported on 05/23/2014) 60 Bottle 0   No current facility-administered medications for this visit.     REVIEW OF SYSTEMS: See HPI for pertinent positives and negatives.  Physical Examination Filed Vitals:   09/03/14 1040 09/03/14 1044  BP: 129/59 129/56  Pulse: 58 56  Resp:  14  Height:  5\' 5"  (1.651 m)  Weight:  154 lb (69.854 kg)  SpO2:  98%   Body mass index is 25.63 kg/(m^2).  General: WDWN in NAD Gait: Normal HENT: WNL Eyes: Right pupil reacts to light, left is fixed. Pulmonary: normal non-labored breathing , without Rales, rhonchi, wheezing Cardiac: Irregular, murmur detected  Abdomen: soft, NT, no palpable masses Skin: no rashes, no ulcers; no Gangrene , no cellulitis; no open wounds.  VASCULAR EXAM  Carotid Bruits  Left Right   Transmitted cardiac murmur Transmitted cardiac murmur  Aorta is palpable Radial pulses are 2+ palpable and =   VASCULAR EXAM: Extremities without ischemic changes  without Gangrene; without open wounds.      LE Pulses LEFT RIGHT   FEMORAL 3+ palpable 3+ palpable    POPLITEAL not palpable  not palpable   POSTERIOR TIBIAL not palpable  not palpable    DORSALIS PEDIS  ANTERIOR TIBIAL not palpable  not palpable     Musculoskeletal: + muscle wasting and atrophy, RA changes in hands; 1+ non pitting edema in feet. Bilateral bunions.   Neurologic: A&O X 3; Appropriate Affect ;  SENSATION: normal; MOTOR FUNCTION: 5/5 in UE's, 4/5 in LE's Symmetric, CN 2-12 intact except for hard of hearing and loss of vision in left eye Speech is fluent/normal          Non-Invasive Vascular Imaging (09/03/2014):  CEREBROVASCULAR DUPLEX EVALUATION    INDICATION: Follow-up carotid disease     PREVIOUS INTERVENTION(S): Right carotid endarterectomy 1997. Known left internal carotid artery occlusion.    DUPLEX EXAM:     RIGHT  LEFT  Peak Systolic Velocities (cm/s) End Diastolic Velocities (cm/s) Plaque LOCATION Peak Systolic Velocities (cm/s) End Diastolic Velocities (cm/s) Plaque  178 28  CCA PROXIMAL 68 0   153 21  CCA MID 61 0   104 24  CCA DISTAL 57 0   133 0  ECA 326 0   151 27  ICA PROXIMAL 0 0   136 28  ICA MID 0 0   116 30  ICA DISTAL 0 0     NA ICA / CCA Ratio (PSV) NA  Bidirectional Vertebral Flow Antegrade   130 Brachial Systolic Pressure (mmHg) 130  Monophasic  Brachial Artery Waveforms Within normal limits     Plaque Morphology:  HM = Homogeneous, HT = Heterogeneous, CP = Calcific Plaque, SP = Smooth Plaque, IP = Irregular Plaque  ADDITIONAL FINDINGS:     IMPRESSION: 1. Widely patent right carotid endarterectomy without evidence of restenosis or hyperplasia. 2. Confirmed occlusion of left internal carotid artery. 3. Elevated velocities are observed in the left external carotid artery likely due to compensation. Minimal  plaque is observed.  4. Right vertebral artery is bidirectional. Left is antegrade. 5. Right subclavian artery is monophasic. Proximal segment was not well visualized and may have significant stenosis vs occlusion.     Compared to the previous exam:  No significant change compared to prior exam.      ABDOMINAL AORTA DUPLEX EVALUATION    INDICATION: Follow-up abdominal aortic aneurysm     PREVIOUS INTERVENTION(S):     DUPLEX EXAM:     LOCATION DIAMETER AP (cm) DIAMETER TRANSVERSE (cm) VELOCITIES (cm/sec)  Aorta Proximal 2.17 2.24 220  Aorta Mid 2.47 2.62 156  Aorta Distal 4.46 4.45 93  Right Common Iliac Artery 5.2 5.0 25  Left Common Iliac Artery 4.1 4.0 27    Previous max aortic diameter:  Aorta: 4.4cm x 4.1cm Right CIA: 5.2cm Left CIA: 4.3cm Date: 05/23/2014 (CT angio in ER)     ADDITIONAL FINDINGS:     IMPRESSION: 1. Abdominal aortic aneurysm measuring 4.46cm. 2. Right common iliac artery aneurysm measuring 5.2cm. 3. Left common iliac artery aneurysm measuring 4.1cm. 4. Velocities in the proximal and mid aorta are elevated with diffuse atherosclerotic disease; no significant focal stenosis is observed.    Compared to the previous exam:  Essentially stable diameter measurements of the aorta and bilateral iliac  arteries compared to the CT angiogram performed during an ER visit in February 2016.      ASSESSMENT:  Hunter Choi is a 81 y.o. male has been followed for an abdominal aortic aneurysm and bilateral common iliac artery aneurysms. He also has a history of right carotid endarterectomy in 1997, he has a known occlusion of the left internal carotid artery. He has not had a stroke since just before the 1997 right CEA.   On 03/06/13 Dr. Adele Choi progress note assessment was as follows: This patient has a 4.7 cm distal infrarenal abdominal aortic aneurysm, a 4.7 cm right common iliac artery aneurysm, and a 3.8 cm left common iliac artery aneurysm, and bilateral  hypogastric artery aneurysms. Given his age and medical comorbidities, he is not a good candidate for open repair of his aneurysms. Likewise, as described above he did not appear to be a good candidate for EVAR as this would require exclusion of both hypogastric arteries. Fortunately the aneurysms have only enlarged slightly over the last 4 years. I have recommended a conservative approach. The son who is with him today he is in agreement with that plan.  Today's carotid Duplex suggests a widely patent right carotid endarterectomy without evidence of restenosis or hyperplasia. Confirmed occlusion of left internal carotid artery. Right vertebral artery is bidirectional. Left is antegrade. Right subclavian artery is monophasic. Proximal segment was not well visualized and may have significant stenosis vs occlusion. No significant change compared to prior exam.   Today's aortoiliac Duplex suggests abdominal aortic aneurysm measuring 4.46cm. Right common iliac artery aneurysm measuring 5.2cm. Left common iliac artery aneurysm measuring 4.1cm. Essentially stable diameter measurements of the aorta and bilateral iliac arteries compared to the CT angiogram performed during an ER visit in February 2016.  Face to face time with patient was 25 minutes. Over 50% of this time was spent on counseling and coordination of care.    PLAN:   Based on today's exam and non-invasive vascular lab results, the patient will follow up in 6 months with the following tests: aortoiliac Duplex and follow up with Dr. Edilia Choi; carotid Duplex in a year. I discussed in depth with the patient the nature of atherosclerosis, and emphasized the importance of maximal medical management including strict control of blood pressure, blood glucose, and lipid levels, obtaining regular exercise, and cessation of smoking.  The patient is aware that without maximal medical management the underlying atherosclerotic disease process will progress,  limiting the benefit of any interventions. Consideration for repair of AAA would be made when the size is 5.5 cm, growth > 1 cm/yr, and symptomatic status. The patient was given information about stroke prevention and what symptoms should prompt the patient to seek immediate medical care. The patient was given information about AAA including signs, symptoms, treatment,  what symptoms should prompt the patient to seek immediate medical care, and how to minimize the risk of enlargement and rupture of aneurysms. The patient was given information about PAD including signs, symptoms, treatment, what symptoms should prompt the patient to seek immediate medical care, and risk reduction measures to take. Thank you for allowing [place] to participate in this patient's care.  Charisse March, RN, MSN, FNP-C [facility] Office: [phone]  Clinic MD: Darrick Penna  09/03/2014 11:00 AM

## 2014-09-04 NOTE — Addendum Note (Signed)
Addended by: Adria Dill L on: 09/04/2014 05:16 PM   Modules accepted: Orders

## 2014-11-14 ENCOUNTER — Encounter (HOSPITAL_COMMUNITY): Payer: Self-pay

## 2014-11-14 ENCOUNTER — Inpatient Hospital Stay (HOSPITAL_COMMUNITY)
Admission: EM | Admit: 2014-11-14 | Discharge: 2014-11-18 | DRG: 854 | Disposition: A | Discharge status: Home Health | Payer: Medicare Other | Attending: Internal Medicine | Admitting: Internal Medicine

## 2014-11-14 ENCOUNTER — Emergency Department (HOSPITAL_COMMUNITY): Payer: Medicare Other

## 2014-11-14 DIAGNOSIS — D638 Anemia in other chronic diseases classified elsewhere: Secondary | ICD-10-CM | POA: Diagnosis present

## 2014-11-14 DIAGNOSIS — I25810 Atherosclerosis of coronary artery bypass graft(s) without angina pectoris: Secondary | ICD-10-CM | POA: Diagnosis present

## 2014-11-14 DIAGNOSIS — R7989 Other specified abnormal findings of blood chemistry: Secondary | ICD-10-CM | POA: Diagnosis present

## 2014-11-14 DIAGNOSIS — I714 Abdominal aortic aneurysm, without rupture: Secondary | ICD-10-CM | POA: Diagnosis present

## 2014-11-14 DIAGNOSIS — M069 Rheumatoid arthritis, unspecified: Secondary | ICD-10-CM | POA: Diagnosis present

## 2014-11-14 DIAGNOSIS — K810 Acute cholecystitis: Secondary | ICD-10-CM | POA: Diagnosis present

## 2014-11-14 DIAGNOSIS — I480 Paroxysmal atrial fibrillation: Secondary | ICD-10-CM | POA: Diagnosis present

## 2014-11-14 DIAGNOSIS — E861 Hypovolemia: Secondary | ICD-10-CM | POA: Diagnosis present

## 2014-11-14 DIAGNOSIS — I447 Left bundle-branch block, unspecified: Secondary | ICD-10-CM | POA: Diagnosis present

## 2014-11-14 DIAGNOSIS — E871 Hypo-osmolality and hyponatremia: Secondary | ICD-10-CM | POA: Diagnosis present

## 2014-11-14 DIAGNOSIS — Z8673 Personal history of transient ischemic attack (TIA), and cerebral infarction without residual deficits: Secondary | ICD-10-CM

## 2014-11-14 DIAGNOSIS — Z951 Presence of aortocoronary bypass graft: Secondary | ICD-10-CM

## 2014-11-14 DIAGNOSIS — N400 Enlarged prostate without lower urinary tract symptoms: Secondary | ICD-10-CM | POA: Diagnosis present

## 2014-11-14 DIAGNOSIS — K819 Cholecystitis, unspecified: Secondary | ICD-10-CM | POA: Diagnosis present

## 2014-11-14 DIAGNOSIS — Z96651 Presence of right artificial knee joint: Secondary | ICD-10-CM | POA: Diagnosis present

## 2014-11-14 DIAGNOSIS — I2510 Atherosclerotic heart disease of native coronary artery without angina pectoris: Secondary | ICD-10-CM | POA: Diagnosis present

## 2014-11-14 DIAGNOSIS — N3000 Acute cystitis without hematuria: Secondary | ICD-10-CM

## 2014-11-14 DIAGNOSIS — A419 Sepsis, unspecified organism: Principal | ICD-10-CM | POA: Diagnosis present

## 2014-11-14 DIAGNOSIS — D6959 Other secondary thrombocytopenia: Secondary | ICD-10-CM | POA: Diagnosis present

## 2014-11-14 DIAGNOSIS — R778 Other specified abnormalities of plasma proteins: Secondary | ICD-10-CM | POA: Diagnosis present

## 2014-11-14 DIAGNOSIS — H9190 Unspecified hearing loss, unspecified ear: Secondary | ICD-10-CM | POA: Diagnosis present

## 2014-11-14 DIAGNOSIS — R945 Abnormal results of liver function studies: Secondary | ICD-10-CM

## 2014-11-14 DIAGNOSIS — E039 Hypothyroidism, unspecified: Secondary | ICD-10-CM | POA: Diagnosis present

## 2014-11-14 DIAGNOSIS — N390 Urinary tract infection, site not specified: Secondary | ICD-10-CM | POA: Diagnosis present

## 2014-11-14 DIAGNOSIS — I10 Essential (primary) hypertension: Secondary | ICD-10-CM | POA: Diagnosis present

## 2014-11-14 DIAGNOSIS — I248 Other forms of acute ischemic heart disease: Secondary | ICD-10-CM | POA: Diagnosis present

## 2014-11-14 DIAGNOSIS — Z952 Presence of prosthetic heart valve: Secondary | ICD-10-CM | POA: Diagnosis not present

## 2014-11-14 DIAGNOSIS — K219 Gastro-esophageal reflux disease without esophagitis: Secondary | ICD-10-CM | POA: Diagnosis present

## 2014-11-14 DIAGNOSIS — Z79899 Other long term (current) drug therapy: Secondary | ICD-10-CM

## 2014-11-14 DIAGNOSIS — I5042 Chronic combined systolic (congestive) and diastolic (congestive) heart failure: Secondary | ICD-10-CM | POA: Diagnosis present

## 2014-11-14 DIAGNOSIS — E872 Acidosis, unspecified: Secondary | ICD-10-CM | POA: Diagnosis present

## 2014-11-14 DIAGNOSIS — Z881 Allergy status to other antibiotic agents status: Secondary | ICD-10-CM | POA: Diagnosis not present

## 2014-11-14 DIAGNOSIS — IMO0001 Reserved for inherently not codable concepts without codable children: Secondary | ICD-10-CM | POA: Insufficient documentation

## 2014-11-14 DIAGNOSIS — F1010 Alcohol abuse, uncomplicated: Secondary | ICD-10-CM | POA: Diagnosis present

## 2014-11-14 DIAGNOSIS — Z7952 Long term (current) use of systemic steroids: Secondary | ICD-10-CM

## 2014-11-14 DIAGNOSIS — Z7982 Long term (current) use of aspirin: Secondary | ICD-10-CM

## 2014-11-14 LAB — URINALYSIS, ROUTINE W REFLEX MICROSCOPIC
GLUCOSE, UA: NEGATIVE mg/dL
Ketones, ur: NEGATIVE mg/dL
Nitrite: POSITIVE — AB
Protein, ur: 30 mg/dL — AB
Specific Gravity, Urine: 1.016 (ref 1.005–1.030)
Urobilinogen, UA: 4 mg/dL — ABNORMAL HIGH (ref 0.0–1.0)
pH: 6 (ref 5.0–8.0)

## 2014-11-14 LAB — COMPREHENSIVE METABOLIC PANEL
ALK PHOS: 410 U/L — AB (ref 38–126)
ALT: 164 U/L — AB (ref 17–63)
AST: 412 U/L — AB (ref 15–41)
Albumin: 3.3 g/dL — ABNORMAL LOW (ref 3.5–5.0)
Anion gap: 11 (ref 5–15)
BUN: 19 mg/dL (ref 6–20)
CO2: 21 mmol/L — AB (ref 22–32)
CREATININE: 1.13 mg/dL (ref 0.61–1.24)
Calcium: 8.4 mg/dL — ABNORMAL LOW (ref 8.9–10.3)
Chloride: 98 mmol/L — ABNORMAL LOW (ref 101–111)
GFR calc Af Amer: >60 mL/min (ref >60)
GFR calc non Af Amer: 57 mL/min — ABNORMAL LOW (ref >60)
GLUCOSE: 99 mg/dL (ref 65–99)
Potassium: 3.6 mmol/L (ref 3.5–5.1)
SODIUM: 130 mmol/L — AB (ref 135–145)
Total Bilirubin: 3 mg/dL — ABNORMAL HIGH (ref 0.3–1.2)
Total Protein: 5.7 g/dL — ABNORMAL LOW (ref 6.5–8.1)

## 2014-11-14 LAB — CBC WITH DIFFERENTIAL/PLATELET
Basophils Absolute: 0 10*3/uL (ref 0.0–0.1)
Basophils Relative: 0 % (ref 0–1)
EOS ABS: 0 10*3/uL (ref 0.0–0.7)
EOS PCT: 0 % (ref 0–5)
HCT: 31 % — ABNORMAL LOW (ref 39.0–52.0)
HEMOGLOBIN: 10.2 g/dL — AB (ref 13.0–17.0)
LYMPHS ABS: 0.4 10*3/uL — AB (ref 0.7–4.0)
Lymphocytes Relative: 4 % — ABNORMAL LOW (ref 12–46)
MCH: 29.5 pg (ref 26.0–34.0)
MCHC: 32.9 g/dL (ref 30.0–36.0)
MCV: 89.6 fL (ref 78.0–100.0)
MONO ABS: 1 10*3/uL (ref 0.1–1.0)
MONOS PCT: 9 % (ref 3–12)
Neutro Abs: 9.1 10*3/uL — ABNORMAL HIGH (ref 1.7–7.7)
Neutrophils Relative %: 86 % — ABNORMAL HIGH (ref 43–77)
PLATELETS: 54 10*3/uL — AB (ref 150–400)
RBC: 3.46 MIL/uL — ABNORMAL LOW (ref 4.22–5.81)
RDW: 17.5 % — ABNORMAL HIGH (ref 11.5–15.5)
WBC: 10.5 10*3/uL (ref 4.0–10.5)

## 2014-11-14 LAB — LIPASE, BLOOD: Lipase: 43 U/L (ref 22–51)

## 2014-11-14 LAB — URINE MICROSCOPIC-ADD ON

## 2014-11-14 LAB — I-STAT CG4 LACTIC ACID, ED
Lactic Acid, Venous: 2.22 mmol/L (ref 0.5–2.0)
Lactic Acid, Venous: 1.01 mmol/L (ref 0.5–2.0)

## 2014-11-14 LAB — TROPONIN I
TROPONIN I: 0.42 ng/mL — AB (ref <0.031)
Troponin I: 0.66 ng/mL (ref <0.031)

## 2014-11-14 MED ORDER — IOHEXOL 300 MG/ML  SOLN: 25.0000 mL | Freq: Once | INTRAMUSCULAR | Status: DC | PRN

## 2014-11-14 MED ORDER — ASPIRIN 81 MG PO CHEW
324.0000 mg | CHEWABLE_TABLET | Freq: Once | ORAL | Status: AC
  Administered 2014-11-14: 324 mg via ORAL
  Filled 2014-11-14: qty 4

## 2014-11-14 MED ORDER — IOHEXOL 300 MG/ML  SOLN
100.0000 mL | Freq: Once | INTRAMUSCULAR | Status: AC | PRN
  Administered 2014-11-14: 100 mL via INTRAVENOUS

## 2014-11-14 MED ORDER — PIPERACILLIN-TAZOBACTAM 3.375 G IVPB 30 MIN
3.3750 g | Freq: Once | INTRAVENOUS | Status: AC
  Administered 2014-11-14: 3.375 g via INTRAVENOUS
  Filled 2014-11-14: qty 50

## 2014-11-14 MED ORDER — SODIUM CHLORIDE 0.9 % IV SOLN
1000.0000 mL | Freq: Once | INTRAVENOUS | Status: AC
  Administered 2014-11-14: 1000 mL via INTRAVENOUS

## 2014-11-14 MED ORDER — SODIUM CHLORIDE 0.9 % IV SOLN
1000.0000 mL | INTRAVENOUS | Status: DC
  Administered 2014-11-14: 1000 mL via INTRAVENOUS

## 2014-11-14 NOTE — ED Notes (Signed)
MD at bedside. 

## 2014-11-14 NOTE — ED Notes (Signed)
Dr. Adrian Blackwater sent message page for elevated troponin. Consult for cardiology placed at [phone] by Dr. Adrian Blackwater.

## 2014-11-14 NOTE — H&P (Signed)
History and Physical  Hunter Choi:[id number] DOB: 1927/11/25 DOA: 11/14/2014  Referring physician: Dr Broadus John, ED physician PCP: Kristie Cowman, MD   Chief Complaint: Vomiting  HPI: Hunter Choi is a 81 y.o. male  With a history of hypertension, thyroid disease, inflammatory arthritis, AAA, coronary artery disease with history of 4 vessel CABG, combined systolic and diastolic heart failure with an EF of 40-45% and a grade 1 diastolic dysfunction on echocardiogram on 01/13/2014. The patient presents to the emergency department with 24 hours of worsening fevers, nausea, vomiting. Food and liquids exacerbates the nausea and vomiting. He has some mild right upper quadrant pain that [facility] last night, although in discussion with him the pain is less severe today. On presentation to the hospital, his initial systolic blood pressure was in the 90s. He did have a lactic acidosis. He was fluid resuscitated which increased his systolic blood pressure to the 120s. He is found to have had her urinary tract infection as well as a probable cholecystitis. In addition to these problems, he does have an elevated troponin.   Review of Systems:   Pt complains of fevers, weakness, nausea, mild abdominal pain.  Pt denies any chest pain, shortness of breath, palpitations, headache, blurred vision, diarrhea, constipation, melena, hematochezia, rectal bleeding, bruising, weight gain, weight loss, vision changes.  Review of systems are otherwise negative  Past Medical History  Diagnosis Date  . Hypertension   . GERD (gastroesophageal reflux disease)   . Thyroid disease     HYPOTHYROIDISM  . Aortic stenosis, severe   . Embolism involving retinal artery   . BPH (benign prostatic hypertrophy)     Dr. Retta Diones  . Iliac artery aneurysm     Dr. Edilia Bo  . AAA (abdominal aortic aneurysm)     Dr. Ashley Royalty  . Diastolic heart failure   . S/P CABG x 4 01/26/1998    LIMA to LAD, SVG to D1, SVG to LCx,  SVG to RCA, open saphenous vein harvest via right lower extremity  . Coronary artery disease     Left main disease and severe 3-vessel CAD  . CAD (coronary artery disease) of bypass graft     Chronic occlusion of saphenous vein graft to RCA  . Hypothyroidism   . Shortness of breath   . Heart murmur   . Stroke     mini stroke effective his eye left  . Arthritis   . 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 Atrial fibrillation    Past Surgical History  Procedure Laterality Date  . Left hip hemiarthropasty      AFTER HIP FX  . Coronary artery bypass graft      1999..Dr. Sheliah Plane  . Total knee arthroplasty      right 1992   left  2002  . Biopsy prostate      2005  . Prostate surgery  2012    TURP  . Joint replacement  2007    THR  . Harvest bone graft  1982    FOR THE  ANKLE  . Skin grafts      1968  . Colonoscopy    . Toe surgery Bilateral   . Transcatheter aortic valve replacement, transapical N/A 01/15/2013    Procedure: TRANSCATHETER AORTIC VALVE REPLACEMENT, TRANSAPICAL;  Surgeon: [name], MD;  Location: [facility] OR;  Service: Open Heart Surgery;  Laterality: N/A;  .  Intraoperative transesophageal echocardiogram N/A 01/15/2013    Procedure: INTRAOPERATIVE TRANSESOPHAGEAL ECHOCARDIOGRAM;  Surgeon: [name], MD;  Location: [facility] OR;  Service: Open Heart Surgery;  Laterality: N/A;  . Left and right heart catheterization with coronary angiogram N/A 12/20/2012    Procedure: LEFT AND RIGHT HEART CATHETERIZATION WITH CORONARY ANGIOGRAM;  Surgeon: Lesleigh Noe, MD;  Location: [facility] CATH LAB;  Service: Cardiovascular;  Laterality: N/A;  . Carotid endarterectomy Right Jan. 2, 1997    CE   Social History:  reports that he quit smoking about 56 years ago. His smoking use included Cigarettes. He has a 23.5 pack-year smoking history. He has quit using smokeless  tobacco. His smokeless tobacco use included Chew. He reports that he does not drink alcohol or use illicit drugs. Patient lives at home & is able to participate in activities of daily living  Allergies  Allergen Reactions  . Ciprofloxacin Other (See Comments)    REACTION: mild delirium    Family History  Problem Relation Age of Onset  . Heart attack Mother   . Heart disease Mother     Before age 81  . Hyperlipidemia Mother   . Hypertension Mother   . Hypertension Son   . Heart disease Father     AAA-   . Hyperlipidemia Father   . Heart attack Father   . Hypertension Father   . Heart disease Brother     Before age 54  . Hyperlipidemia Brother   . Heart attack Brother   . Hypertension Brother       Prior to Admission medications   Medication Sig Start Date End Date Taking? Authorizing Provider  amLODipine (NORVASC) 5 MG tablet Take 5 mg by mouth daily at 3 pm. 11/11/14  Yes Historical Provider, MD  aspirin EC 81 MG EC tablet Take 1 tablet (81 mg total) by mouth daily. 01/21/13  Yes Donielle Margaretann Loveless, PA-C  diazepam (VALIUM) 5 MG tablet Take 5 mg by mouth daily at 3 pm.    Yes Historical Provider, MD  folic acid (FOLVITE) 1 MG tablet Take 3 mg by mouth daily.   Yes Historical Provider, MD  levothyroxine (SYNTHROID, LEVOTHROID) 50 MCG tablet Take 50 mcg by mouth daily. 11/06/14  Yes Historical Provider, MD  methotrexate 2.5 MG tablet Take 25 mg by mouth once a week. Every Thursday   Yes Historical Provider, MD  metoprolol tartrate (LOPRESSOR) 25 MG tablet Take 1 tablet (25 mg total) by mouth daily. 01/21/13  Yes Donielle Margaretann Loveless, PA-C  omeprazole (PRILOSEC) 20 MG capsule Take 20 mg by mouth daily.   Yes Historical Provider, MD  predniSONE (DELTASONE) 5 MG tablet Take 5 mg by mouth every other day.    Yes Historical Provider, MD    Physical Exam: BP 121/49 mmHg  Pulse 74  Temp(Src) 100.9 F (38.3 C) (Rectal)  Resp 16  Wt 69.037 kg (152 lb 3.2 oz)  SpO2  95%  General: Elderly Caucasian male. Awake and alert and oriented x3. No acute cardiopulmonary distress.  Eyes: Pupils equal, round, reactive to light. Extraocular muscles are intact. Sclerae anicteric and noninjected.  ENT:  Dry mucosal membranes. No mucosal lesions. Teeth in poor repair  Neck: Neck supple without lymphadenopathy. No carotid bruits. No masses palpated.  Cardiovascular: Regular rate with normal S1-S2 sounds. No murmurs, rubs, gallops auscultated. No JVD.  Respiratory: Good respiratory effort with no wheezes, rales, rhonchi. Lungs clear to auscultation bilaterally.  Abdomen: Soft, nontender, nondistended. Active bowel sounds. No  masses or hepatosplenomegaly  Skin: Dry, warm to touch. 2+ dorsalis pedis and radial pulses. Musculoskeletal: No calf or leg pain. All major joints not erythematous nontender.  Psychiatric: Intact judgment and insight.  Neurologic: No focal neurological deficits. Cranial nerves II through XII are grossly intact.           Labs on Admission:  Basic Metabolic Panel:  Recent Labs Lab 11/14/14 1730  NA 130*  K 3.6  CL 98*  CO2 21*  GLUCOSE 99  BUN 19  CREATININE 1.13  CALCIUM 8.4*   Liver Function Tests:  Recent Labs Lab 11/14/14 1730  AST 412*  ALT 164*  ALKPHOS 410*  BILITOT 3.0*  PROT 5.7*  ALBUMIN 3.3*    Recent Labs Lab 11/14/14 1730  LIPASE 43   No results for input(s): AMMONIA in the last 168 hours. CBC:  Recent Labs Lab 11/14/14 1730  WBC 10.5  NEUTROABS 9.1*  HGB 10.2*  HCT 31.0*  MCV 89.6  PLT 54*   Cardiac Enzymes:  Recent Labs Lab 11/14/14 1730 11/14/14 2219  TROPONINI 0.42* 0.66*    BNP (last 3 results) No results for input(s): BNP in the last 8760 hours.  ProBNP (last 3 results) No results for input(s): PROBNP in the last 8760 hours.  CBG: No results for input(s): GLUCAP in the last 168 hours.  Radiological Exams on Admission: Dg Chest 2 View  11/14/2014   CLINICAL DATA:  Shortness  of breath and low grade fever this evening.  EXAM: CHEST  2 VIEW  COMPARISON:  February 19, 2014  FINDINGS: The heart size and mediastinal contours are stable. Heart size is enlarged. Patient is status post prior median sternotomy, CABG, transcatheter aortic valve replacement. There is no focal infiltrate, pulmonary edema, or pleural effusion. The visualized skeletal structures are stable.  IMPRESSION: No active cardiopulmonary disease.   Electronically Signed   By: Sherian Rein M.D.   On: 11/14/2014 19:14   Ct Abdomen Pelvis W Contrast  11/14/2014   CLINICAL DATA:  Fever and abdominal pain.  Initial encounter.  EXAM: CT ABDOMEN AND PELVIS WITH CONTRAST  TECHNIQUE: Multidetector CT imaging of the abdomen and pelvis was performed using the standard protocol following bolus administration of intravenous contrast.  CONTRAST:  OMNIPAQUE IOHEXOL 300 MG/ML  SOLN  COMPARISON:  05/23/2014 CTA.  FINDINGS: Musculoskeletal: No aggressive osseous lesions. Severe degenerative disc disease. LEFT hip hemiarthroplasty. RIGHT hip osteoarthritis.  Lung Bases: Atelectasis.  Liver:  Mild within normal limits.  Spleen:  Normal.  Gallbladder: Distended. There is pericholecystic fluid in the gallbladder fossa. No calcified stones are identified.  Common bile duct: Common bile duct appears within normal limits. No calcified common duct stone identified.  Pancreas:  Atrophic.  No mass lesion or inflammatory change.  Adrenal glands:  Normal bilaterally.  Kidneys: Renal scarring is present. Artifactual decreased enhancement in the RIGHT inferior renal pole from extremities. LEFT renal scarring. Excretion of contrast appears normal bilaterally.  Stomach: Hyper trophic appearance of the stomach proximally, suggesting hypertrophic gastritis. No change from prior.  Small bowel:  No small bowel obstruction or inflammatory change.  Colon: Large stool burden. Colonic diverticulosis. No diverticulitis.  Pelvic Genitourinary: Thickening of  the urinary bladder with cellules along the LEFT lateral wall. Bladder is partially obscured by artifact from LEFT hip arthroplasty.  Peritoneum: No free air.  Vascular/lymphatic: Aortoiliac aneurysms bilaterally appear similar to the prior exam. No rupture or acute vascular abnormality. No retroperitoneal hematoma/ hemorrhage. No extravasation of contrast.  Body Wall: Fat containing subxiphoid hernia.  IMPRESSION: 1. Pericholecystic fluid is new since the prior exam and is potentially associated with acute cholecystitis. Correlate with laboratory parameters and consider a RIGHT upper quadrant ultrasound if warranted. 2. Unchanged iliofemoral aneurysms. 3. Bladder wall thickening and cellules suggesting chronic bladder outflow obstruction. Recommend correlation with urinalysis for cystitis.   Electronically Signed   By: Andreas Newport M.D.   On: 11/14/2014 19:32    EKG: Independently reviewed. Atrial place complexes with ventricular rate of 84. Prolonged PR interval 0.243. QRS interval 0.183. Left bundle-branch block with J-point elevation in V1 through V4. In this appears unchanged from prior.  Assessment/Plan Present on Admission:  . Sepsis . Elevated troponin I level . Urinary tract infection . Cholecystitis . Lactic acidosis . Elevated LFTs  This patient was discussed with the ED physician, including pertinent vitals, physical exam findings, labs, and imaging.  We also discussed care given by the ED provider.  #1 sepsis  Admit to stepdown  Patient fluid resuscitated  Continue Zosyn  Repeat CBC in the morning  Repeat metabolic panel in the morning #2 urinary tract infection  Urine culture pending  Continue Zosyn #3 cholecystitis  General surgery consult  Patient on Zosyn #4 lactic acidosis  Improved after fluid resuscitation #5 elevated troponin  Likely from end organ damage secondary to sepsis as patient is asymptomatic  Cards consulted  We'll give full dose  aspirin  Repeat troponins every 6 hours #6 coronary artery disease - status post four-vessel CABG #7 elevated LFTs  Shock liver versus hepatic obstruction secondary from cholecystitis  Repeat liver function tests the morning  DVT prophylaxis: Heparin  Consultants: Gen. surgery, cardiology, pharmacy for dosing of Zosyn  Code Status: Full code  Family Communication: None   Disposition Plan: Admission to stepdown   Levie Heritage, DO [facility] Pager [phone]

## 2014-11-14 NOTE — ED Notes (Signed)
0 Respirations at 8 pm is an error.

## 2014-11-14 NOTE — ED Notes (Signed)
[facility]- called out for fever. Pt found to have LBBB. Family reports oral temp of 103. Pt received 500cc of fluid en route and 1000mg  of tylenol. A&O x4.

## 2014-11-14 NOTE — ED Notes (Signed)
Dr. Adrian Blackwater paged

## 2014-11-14 NOTE — ED Provider Notes (Signed)
CSN: [id number]     Arrival date & time 11/14/14  1657 History   First MD Initiated Contact with Patient 11/14/14 1657     Chief Complaint  Patient presents with  . Fever     (Consider location/radiation/quality/duration/timing/severity/associated sxs/prior Treatment) HPI Patient developed fever to 103. He initially denied any associated symptoms. He stated that he felt well. After asking several times, the patient recalled having abdominal pain that kept him up most of the night. He then stated that he vomited once. He does have known rheumatoid arthritis, he had no acute complaints regarding this. He otherwise gave a negative review of systems. Past Medical History  Diagnosis Date  . Hypertension   . GERD (gastroesophageal reflux disease)   . Thyroid disease     HYPOTHYROIDISM  . Aortic stenosis, severe   . Embolism involving retinal artery   . BPH (benign prostatic hypertrophy)     Dr. Retta Diones  . Iliac artery aneurysm     Dr. Edilia Bo  . AAA (abdominal aortic aneurysm)     Dr. Ashley Royalty  . Diastolic heart failure   . 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 Coronary artery disease     Left main disease and severe 3-vessel CAD  . CAD (coronary artery disease) of bypass graft     Chronic occlusion of saphenous vein graft to RCA  . Hypothyroidism   . Shortness of breath   . Heart murmur   . Stroke     mini stroke effective his eye left  . Arthritis   . 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 Atrial fibrillation    Past Surgical History  Procedure Laterality Date  . Left hip hemiarthropasty      AFTER HIP FX  . Coronary artery bypass graft      1999..Dr. Sheliah Plane  . Total knee arthroplasty      right 1992   left  2002  . Biopsy prostate      2005  . Prostate  surgery  2012    TURP  . Joint replacement  2007    THR  . Harvest bone graft  1982    FOR THE  ANKLE  . Skin grafts      1968  . Colonoscopy    . Toe surgery Bilateral   . Transcatheter aortic valve replacement, transapical N/A 01/15/2013    Procedure: TRANSCATHETER AORTIC VALVE REPLACEMENT, TRANSAPICAL;  Surgeon: [name], MD;  Location: [facility] OR;  Service: Open Heart Surgery;  Laterality: N/A;  . Intraoperative transesophageal echocardiogram N/A 01/15/2013    Procedure: INTRAOPERATIVE TRANSESOPHAGEAL ECHOCARDIOGRAM;  Surgeon: [name], MD;  Location: [facility] OR;  Service: Open Heart Surgery;  Laterality: N/A;  . Left and right heart catheterization with coronary angiogram N/A 12/20/2012    Procedure: LEFT AND RIGHT HEART CATHETERIZATION WITH CORONARY ANGIOGRAM;  Surgeon: Lesleigh Noe, MD;  Location: [facility] CATH LAB;  Service: Cardiovascular;  Laterality: N/A;  . Carotid endarterectomy Right Jan. 2, 1997    CE   Family History  Problem Relation Age of Onset  . Heart attack Mother   . Heart disease Mother     Before age 39  . Hyperlipidemia Mother   . Hypertension Mother   . Hypertension  Son   . Heart disease Father     AAA-   . Hyperlipidemia Father   . Heart attack Father   . Hypertension Father   . Heart disease Brother     Before age 78  . Hyperlipidemia Brother   . Heart attack Brother   . Hypertension Brother    Social History  Substance Use Topics  . Smoking status: Former Smoker -- 0.50 packs/day for 47 years    Types: Cigarettes    Quit date: 03/28/1958  . Smokeless tobacco: Former Neurosurgeon    Types: Chew     Comment: ONLY USED 2 YEARS  . Alcohol Use: No    Review of Systems 10 Systems reviewed and are negative for acute change except as noted in the HPI.    Allergies  Ciprofloxacin  Home Medications   Prior to Admission medications   Medication Sig Start Date End Date Taking? Authorizing Provider  amLODipine (NORVASC) 5 MG tablet Take 5 mg by  mouth daily at 3 pm. 11/11/14  Yes Historical Provider, MD  aspirin EC 81 MG EC tablet Take 1 tablet (81 mg total) by mouth daily. 01/21/13  Yes Donielle Margaretann Loveless, PA-C  diazepam (VALIUM) 5 MG tablet Take 5 mg by mouth daily at 3 pm.    Yes Historical Provider, MD  folic acid (FOLVITE) 1 MG tablet Take 3 mg by mouth daily.   Yes Historical Provider, MD  levothyroxine (SYNTHROID, LEVOTHROID) 50 MCG tablet Take 50 mcg by mouth daily. 11/06/14  Yes Historical Provider, MD  methotrexate 2.5 MG tablet Take 25 mg by mouth once a week. Every Thursday   Yes Historical Provider, MD  metoprolol tartrate (LOPRESSOR) 25 MG tablet Take 1 tablet (25 mg total) by mouth daily. 01/21/13  Yes Donielle Margaretann Loveless, PA-C  omeprazole (PRILOSEC) 20 MG capsule Take 20 mg by mouth daily.   Yes Historical Provider, MD  predniSONE (DELTASONE) 5 MG tablet Take 5 mg by mouth every other day.    Yes Historical Provider, MD   BP 121/49 mmHg  Pulse 74  Temp(Src) 100.9 F (38.3 C) (Rectal)  Resp 16  Wt 152 lb 3.2 oz (69.037 kg)  SpO2 95% Physical Exam  Constitutional: He is oriented to person, place, and time. He appears well-developed and well-nourished.  HENT:  Head: Normocephalic and atraumatic.  Eyes: EOM are normal. Pupils are equal, round, and reactive to light.  Neck: Neck supple.  Cardiovascular: Normal rate, regular rhythm, normal heart sounds and intact distal pulses.   Pulmonary/Chest: Effort normal and breath sounds normal.  Abdominal: Soft. Bowel sounds are normal. He exhibits no distension. There is tenderness (Minimal epigastric and right upper quadrant tenderness. No guarding.).  Musculoskeletal: Normal range of motion. He exhibits no edema.  Patient has significant changes of rheumatoid arthritis with joint changes. He however does not have areas of erythema or effusion of the joints.  Neurological: He is alert and oriented to person, place, and time. He has normal strength. No cranial nerve deficit.  He exhibits normal muscle tone. Coordination normal. GCS eye subscore is 4. GCS verbal subscore is 5. GCS motor subscore is 6.  Skin: Skin is warm, dry and intact. No rash noted.  Psychiatric: He has a normal mood and affect.    ED Course  Procedures (including critical care time) Labs Review Labs Reviewed  COMPREHENSIVE METABOLIC PANEL - Abnormal; Notable for the following:    Sodium 130 (*)    Chloride 98 (*)  CO2 21 (*)    Calcium 8.4 (*)    Total Protein 5.7 (*)    Albumin 3.3 (*)    AST 412 (*)    ALT 164 (*)    Alkaline Phosphatase 410 (*)    Total Bilirubin 3.0 (*)    GFR calc non Af Amer 57 (*)    All other components within normal limits  CBC WITH DIFFERENTIAL/PLATELET - Abnormal; Notable for the following:    RBC 3.46 (*)    Hemoglobin 10.2 (*)    HCT 31.0 (*)    RDW 17.5 (*)    Platelets 54 (*)    Neutrophils Relative % 86 (*)    Neutro Abs 9.1 (*)    Lymphocytes Relative 4 (*)    Lymphs Abs 0.4 (*)    All other components within normal limits  URINALYSIS, ROUTINE W REFLEX MICROSCOPIC (NOT AT [facility]) - Abnormal; Notable for the following:    Color, Urine AMBER (*)    APPearance CLOUDY (*)    Hgb urine dipstick MODERATE (*)    Bilirubin Urine MODERATE (*)    Protein, ur 30 (*)    Urobilinogen, UA 4.0 (*)    Nitrite POSITIVE (*)    Leukocytes, UA LARGE (*)    All other components within normal limits  TROPONIN I - Abnormal; Notable for the following:    Troponin I 0.42 (*)    All other components within normal limits  URINE MICROSCOPIC-ADD ON - Abnormal; Notable for the following:    Bacteria, UA FEW (*)    All other components within normal limits  TROPONIN I - Abnormal; Notable for the following:    Troponin I 0.66 (*)    All other components within normal limits  I-STAT CG4 LACTIC ACID, ED - Abnormal; Notable for the following:    Lactic Acid, Venous 2.22 (*)    All other components within normal limits  CULTURE, BLOOD (ROUTINE X 2)  CULTURE, BLOOD  (ROUTINE X 2)  URINE CULTURE  LIPASE, BLOOD  I-STAT CG4 LACTIC ACID, ED    Imaging Review Dg Chest 2 View  11/14/2014   CLINICAL DATA:  Shortness of breath and low grade fever this evening.  EXAM: CHEST  2 VIEW  COMPARISON:  February 19, 2014  FINDINGS: The heart size and mediastinal contours are stable. Heart size is enlarged. Patient is status post prior median sternotomy, CABG, transcatheter aortic valve replacement. There is no focal infiltrate, pulmonary edema, or pleural effusion. The visualized skeletal structures are stable.  IMPRESSION: No active cardiopulmonary disease.   Electronically Signed   By: Sherian Rein M.D.   On: 11/14/2014 19:14   Ct Abdomen Pelvis W Contrast  11/14/2014   CLINICAL DATA:  Fever and abdominal pain.  Initial encounter.  EXAM: CT ABDOMEN AND PELVIS WITH CONTRAST  TECHNIQUE: Multidetector CT imaging of the abdomen and pelvis was performed using the standard protocol following bolus administration of intravenous contrast.  CONTRAST:  OMNIPAQUE IOHEXOL 300 MG/ML  SOLN  COMPARISON:  05/23/2014 CTA.  FINDINGS: Musculoskeletal: No aggressive osseous lesions. Severe degenerative disc disease. LEFT hip hemiarthroplasty. RIGHT hip osteoarthritis.  Lung Bases: Atelectasis.  Liver:  Mild within normal limits.  Spleen:  Normal.  Gallbladder: Distended. There is pericholecystic fluid in the gallbladder fossa. No calcified stones are identified.  Common bile duct: Common bile duct appears within normal limits. No calcified common duct stone identified.  Pancreas:  Atrophic.  No mass lesion or inflammatory change.  Adrenal glands:  Normal bilaterally.  Kidneys: Renal scarring is present. Artifactual decreased enhancement in the RIGHT inferior renal pole from extremities. LEFT renal scarring. Excretion of contrast appears normal bilaterally.  Stomach: Hyper trophic appearance of the stomach proximally, suggesting hypertrophic gastritis. No change from prior.  Small bowel:  No  small bowel obstruction or inflammatory change.  Colon: Large stool burden. Colonic diverticulosis. No diverticulitis.  Pelvic Genitourinary: Thickening of the urinary bladder with cellules along the LEFT lateral wall. Bladder is partially obscured by artifact from LEFT hip arthroplasty.  Peritoneum: No free air.  Vascular/lymphatic: Aortoiliac aneurysms bilaterally appear similar to the prior exam. No rupture or acute vascular abnormality. No retroperitoneal hematoma/ hemorrhage. No extravasation of contrast.  Body Wall: Fat containing subxiphoid hernia.  IMPRESSION: 1. Pericholecystic fluid is new since the prior exam and is potentially associated with acute cholecystitis. Correlate with laboratory parameters and consider a RIGHT upper quadrant ultrasound if warranted. 2. Unchanged iliofemoral aneurysms. 3. Bladder wall thickening and cellules suggesting chronic bladder outflow obstruction. Recommend correlation with urinalysis for cystitis.   Electronically Signed   By: Andreas Newport M.D.   On: 11/14/2014 19:32   I have personally reviewed and evaluated these images and lab results as part of my medical decision-making.  EKG: Left bundle branch block with nonspecific lateral T-wave changes. Abnormal EKG H [place] of 84. R2 43 QTC 503.  Compared with old increased widening of the bundle branch with dynamic lateral changes.  Consult: His case is reviewed with hospitalist for admission. Consult: Gen. surgery, agrees with administration of Zosyn and will see for consultation.  CRITICAL CARE Performed by: Arby Barrette   Total critical care time: 45  Critical care time was exclusive of separately billable procedures and treating other patients.  Critical care was necessary to treat or prevent imminent or life-threatening deterioration.  Critical care was time spent personally by me on the following activities: development of treatment plan with patient and/or surrogate as well as nursing,  discussions with consultants, evaluation of patient's response to treatment, examination of patient, obtaining history from patient or surrogate, ordering and performing treatments and interventions, ordering and review of laboratory studies, ordering and review of radiographic studies, pulse oximetry and re-evaluation of patient's condition.  MDM   Final diagnoses:  Sepsis, due to unspecified organism  Cholecystitis  UTI (lower urinary tract infection)   Patient denies significant history. CT does identify cholecystitis and LFTs are elevated consistent with obstructive pattern. Patient also has UTI as well as elevated cardiac enzymes. Consults have a place for these multifactorial problems. Patient appears septic by hypotension, elevated lactic acid and fever. Sepsis protocol was initiated for intra-abdominal source. Patient also has an elevation in cardiac enzymes although he denied any complaints of chest pain or shortness of breath. Enzymes will be trended and cardiology has been consult. Patient has improved with treatment and blood pressures are stable in the 120s with heart rates remaining in the 60s and 70s and patient with clear mental status.    Arby Barrette, MD 11/14/14 2350

## 2014-11-14 NOTE — ED Notes (Signed)
Attempted to call report

## 2014-11-14 NOTE — Consult Note (Cosign Needed)
CARDIOLOGY CONSULT NOTE   Patient ID: Hunter Choi MRN: [id number], DOB/AGE: May 09, 1927   Admit date: 11/14/2014 Date of Consult: 11/14/2014   Primary Physician: Kristie Cowman, MD Primary Cardiologist: Verdis Prime  Pt. Profile Pt is an 81 yo M w/ a PMH significant for CAD s/p CABG, severe AS s/p TAVR, AAA, and paroxysmal afib admitted for sepsis 2/2 cholecystitis vs. urinary source found to have elevated troponin.  Problem List  Past Medical History  Diagnosis Date  . Hypertension   . GERD (gastroesophageal reflux disease)   . Thyroid disease     HYPOTHYROIDISM  . Aortic stenosis, severe   . Embolism involving retinal artery   . BPH (benign prostatic hypertrophy)     Dr. Retta Diones  . Iliac artery aneurysm     Dr. Edilia Bo  . AAA (abdominal aortic aneurysm)     Dr. Ashley Royalty  . Diastolic heart failure   . 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 Coronary artery disease     Left main disease and severe 3-vessel CAD  . CAD (coronary artery disease) of bypass graft     Chronic occlusion of saphenous vein graft to RCA  . Hypothyroidism   . Shortness of breath   . Heart murmur   . Stroke     mini stroke effective his eye left  . Arthritis   . 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 Atrial fibrillation     Past Surgical History  Procedure Laterality Date  . Left hip hemiarthropasty      AFTER HIP FX  . Coronary artery bypass graft      1999..Dr. Sheliah Plane  . Total knee arthroplasty      right 1992   left  2002  . Biopsy prostate      2005  . Prostate surgery  2012    TURP  . Joint replacement  2007    THR  . Harvest bone graft  1982    FOR THE  ANKLE  . Skin grafts      1968  . Colonoscopy    . Toe surgery Bilateral   . Transcatheter aortic valve  replacement, transapical N/A 01/15/2013    Procedure: TRANSCATHETER AORTIC VALVE REPLACEMENT, TRANSAPICAL;  Surgeon: [name], MD;  Location: [facility] OR;  Service: Open Heart Surgery;  Laterality: N/A;  . Intraoperative transesophageal echocardiogram N/A 01/15/2013    Procedure: INTRAOPERATIVE TRANSESOPHAGEAL ECHOCARDIOGRAM;  Surgeon: [name], MD;  Location: [facility] OR;  Service: Open Heart Surgery;  Laterality: N/A;  . Left and right heart catheterization with coronary angiogram N/A 12/20/2012    Procedure: LEFT AND RIGHT HEART CATHETERIZATION WITH CORONARY ANGIOGRAM;  Surgeon: Lesleigh Noe, MD;  Location: [facility] CATH LAB;  Service: Cardiovascular;  Laterality: N/A;  . Carotid endarterectomy Right Jan. 2, 1997    CE     Allergies  Allergies  Allergen Reactions  . Ciprofloxacin Other (See Comments)    REACTION: mild delirium   HPI   Pt follows with Dr. Verdis Prime in cardiology clinic. Had 4vCABG in remote past. No active anginal-type symptoms. Had severe symptomatic AS (i.e. decline in exertional capacity) and underwent TAVR 12/2012. Has improved since that time. Today developed RUQ pain and n/v. Hypotensive on arrival  and received IVF and abx. Labs notable for mixed LFT picture. Lactic acidosis. Trop 0.4->0.6. EKG showed LBBB w/ characteristic repol changes. Denies CP. Cardiology consulted for NSTEMI.  Inpatient Medications     Family History Family History  Problem Relation Age of Onset  . Heart attack Mother   . Heart disease Mother     Before age 75  . Hyperlipidemia Mother   . Hypertension Mother   . Hypertension Son   . Heart disease Father     AAA-   . Hyperlipidemia Father   . Heart attack Father   . Hypertension Father   . Heart disease Brother     Before age 57  . Hyperlipidemia Brother   . Heart attack Brother   . Hypertension Brother     Social History Social History   Social History  . Marital Status: Married    Spouse Name: N/A  . Number of  Children: N/A  . Years of Education: N/A   Occupational History  . Not on file.   Social History Main Topics  . Smoking status: Former Smoker -- 0.50 packs/day for 47 years    Types: Cigarettes    Quit date: 03/28/1958  . Smokeless tobacco: Former Neurosurgeon    Types: Chew     Comment: ONLY USED 2 YEARS  . Alcohol Use: No  . Drug Use: No  . Sexual Activity: Not on file   Other Topics Concern  . Not on file   Social History Narrative    Review of Systems  General:  No chills, fever, night sweats or weight changes.  Cardiovascular:  No chest pain, dyspnea on exertion, edema, orthopnea, palpitations, paroxysmal nocturnal dyspnea. Dermatological: No rash, lesions/masses Respiratory: No cough, dyspnea Urologic: No hematuria, dysuria Abdominal:   No nausea, vomiting, diarrhea, bright red blood per rectum, melena, or hematemesis Neurologic:  No visual changes, wkns, changes in mental status. All other systems reviewed and are otherwise negative except as noted above.  Physical Exam  Blood pressure 121/49, pulse 74, temperature 100.9 F (38.3 C), temperature source Rectal, resp. rate 16, weight 152 lb 3.2 oz (69.037 kg), SpO2 95 %.  General: Pleasant, NAD Psych: Normal affect. Neuro: Alert and oriented X 3. Moves all extremities spontaneously. HEENT: Flat JVP. Lungs:  Resp regular and unlabored, CTA. Heart: RRR no s3, s4, or murmurs. Abdomen: Soft, non-tender, non-distended, BS + x 4.  Extremities: No clubbing, cyanosis or edema. DP/PT/Radials 2+ and equal bilaterally.  Labs   Recent Labs  11/14/14 1730 11/14/14 2219  TROPONINI 0.42* 0.66*   Lab Results  Component Value Date   WBC 10.5 11/14/2014   HGB 10.2* 11/14/2014   HCT 31.0* 11/14/2014   MCV 89.6 11/14/2014   PLT 54* 11/14/2014    Recent Labs Lab 11/14/14 1730  NA 130*  K 3.6  CL 98*  CO2 21*  BUN 19  CREATININE 1.13  CALCIUM 8.4*  PROT 5.7*  BILITOT 3.0*  ALKPHOS 410*  ALT 164*  AST 412*  GLUCOSE  99   Lab Results  Component Value Date   CHOL 84 02/21/2014   HDL 27* 02/21/2014   LDLCALC 38 02/21/2014   TRIG 93 02/21/2014   No results found for: DDIMER  Radiology/Studies  Dg Chest 2 View  11/14/2014   CLINICAL DATA:  Shortness of breath and low grade fever this evening.  EXAM: CHEST  2 VIEW  COMPARISON:  February 19, 2014  FINDINGS: The heart size and mediastinal contours are stable. Heart  size is enlarged. Patient is status post prior median sternotomy, CABG, transcatheter aortic valve replacement. There is no focal infiltrate, pulmonary edema, or pleural effusion. The visualized skeletal structures are stable.  IMPRESSION: No active cardiopulmonary disease.   Electronically Signed   By: Sherian Rein M.D.   On: 11/14/2014 19:14   Ct Abdomen Pelvis W Contrast  11/14/2014   CLINICAL DATA:  Fever and abdominal pain.  Initial encounter.  EXAM: CT ABDOMEN AND PELVIS WITH CONTRAST  TECHNIQUE: Multidetector CT imaging of the abdomen and pelvis was performed using the standard protocol following bolus administration of intravenous contrast.  CONTRAST:  OMNIPAQUE IOHEXOL 300 MG/ML  SOLN  COMPARISON:  05/23/2014 CTA.  FINDINGS: Musculoskeletal: No aggressive osseous lesions. Severe degenerative disc disease. LEFT hip hemiarthroplasty. RIGHT hip osteoarthritis.  Lung Bases: Atelectasis.  Liver:  Mild within normal limits.  Spleen:  Normal.  Gallbladder: Distended. There is pericholecystic fluid in the gallbladder fossa. No calcified stones are identified.  Common bile duct: Common bile duct appears within normal limits. No calcified common duct stone identified.  Pancreas:  Atrophic.  No mass lesion or inflammatory change.  Adrenal glands:  Normal bilaterally.  Kidneys: Renal scarring is present. Artifactual decreased enhancement in the RIGHT inferior renal pole from extremities. LEFT renal scarring. Excretion of contrast appears normal bilaterally.  Stomach: Hyper trophic appearance of the  stomach proximally, suggesting hypertrophic gastritis. No change from prior.  Small bowel:  No small bowel obstruction or inflammatory change.  Colon: Large stool burden. Colonic diverticulosis. No diverticulitis.  Pelvic Genitourinary: Thickening of the urinary bladder with cellules along the LEFT lateral wall. Bladder is partially obscured by artifact from LEFT hip arthroplasty.  Peritoneum: No free air.  Vascular/lymphatic: Aortoiliac aneurysms bilaterally appear similar to the prior exam. No rupture or acute vascular abnormality. No retroperitoneal hematoma/ hemorrhage. No extravasation of contrast.  Body Wall: Fat containing subxiphoid hernia.  IMPRESSION: 1. Pericholecystic fluid is new since the prior exam and is potentially associated with acute cholecystitis. Correlate with laboratory parameters and consider a RIGHT upper quadrant ultrasound if warranted. 2. Unchanged iliofemoral aneurysms. 3. Bladder wall thickening and cellules suggesting chronic bladder outflow obstruction. Recommend correlation with urinalysis for cystitis.   Electronically Signed   By: Andreas Newport M.D.   On: 11/14/2014 19:32   ECG - NSR, 1st degree AV block, LBBB  ASSESSMENT AND PLAN Pt is an 81 yo M w/ a PMH significant for CAD s/p CABG, severe AS s/p TAVR, AAA, and paroxysmal afib admitted for sepsis 2/2 cholecystitis vs. urinary source found to have elevated troponin.  #CAD s/p 4vCABG: Troponin elevated on admission. EKG shows LBBB and does not meet Sgarbossa criteria though these are neither sensitive nor specific. No CP at this time. Given absence of CP and electrocardiographic criteria of ischemia as well as slow gradual uptrend in Tn, favor type II NSTEMI 2/2 evolving sepsis as there was other evidence of end-organ damage on initial labs. -trend CE and EKG until trop downtrending -continue home ASA, defer on heparin gtt and clopidogrel -metoprolol and other BP meds contraindicated given hypotension/sepsis -defer  to primary team and surgery on optimal timing of operative intervention  #severe AS s/p TAVR -continue ASA per above  Thank you for this interesting consult. We will sign off at this time.  Signed, Vista Mink, MD  11/14/2014, 11:54 PM

## 2014-11-15 ENCOUNTER — Encounter (HOSPITAL_COMMUNITY): Payer: Self-pay | Admitting: *Deleted

## 2014-11-15 ENCOUNTER — Inpatient Hospital Stay (HOSPITAL_COMMUNITY): Payer: Medicare Other

## 2014-11-15 DIAGNOSIS — E872 Acidosis: Secondary | ICD-10-CM

## 2014-11-15 DIAGNOSIS — K810 Acute cholecystitis: Secondary | ICD-10-CM

## 2014-11-15 DIAGNOSIS — A419 Sepsis, unspecified organism: Principal | ICD-10-CM

## 2014-11-15 DIAGNOSIS — R7989 Other specified abnormal findings of blood chemistry: Secondary | ICD-10-CM

## 2014-11-15 LAB — CBC
HEMATOCRIT: 31.8 % — AB (ref 39.0–52.0)
Hemoglobin: 10.4 g/dL — ABNORMAL LOW (ref 13.0–17.0)
MCH: 29.1 pg (ref 26.0–34.0)
MCHC: 32.7 g/dL (ref 30.0–36.0)
MCV: 89.1 fL (ref 78.0–100.0)
PLATELETS: 101 10*3/uL — AB (ref 150–400)
RBC: 3.57 MIL/uL — AB (ref 4.22–5.81)
RDW: 17.6 % — AB (ref 11.5–15.5)
WBC: 10.6 10*3/uL — AB (ref 4.0–10.5)

## 2014-11-15 LAB — COMPREHENSIVE METABOLIC PANEL
ALT: 182 U/L — ABNORMAL HIGH (ref 17–63)
ANION GAP: 9 (ref 5–15)
AST: 281 U/L — AB (ref 15–41)
Albumin: 3 g/dL — ABNORMAL LOW (ref 3.5–5.0)
Alkaline Phosphatase: 385 U/L — ABNORMAL HIGH (ref 38–126)
BILIRUBIN TOTAL: 2.7 mg/dL — AB (ref 0.3–1.2)
BUN: 14 mg/dL (ref 6–20)
CHLORIDE: 100 mmol/L — AB (ref 101–111)
CO2: 22 mmol/L (ref 22–32)
Calcium: 8 mg/dL — ABNORMAL LOW (ref 8.9–10.3)
Creatinine, Ser: 1.1 mg/dL (ref 0.61–1.24)
GFR calc Af Amer: >60 mL/min (ref >60)
GFR, EST NON AFRICAN AMERICAN: 59 mL/min — AB (ref >60)
Glucose, Bld: 135 mg/dL — ABNORMAL HIGH (ref 65–99)
POTASSIUM: 3.9 mmol/L (ref 3.5–5.1)
Sodium: 131 mmol/L — ABNORMAL LOW (ref 135–145)
TOTAL PROTEIN: 5.8 g/dL — AB (ref 6.5–8.1)

## 2014-11-15 LAB — TROPONIN I
TROPONIN I: 0.62 ng/mL — AB (ref <0.031)
TROPONIN I: 0.83 ng/mL — AB (ref <0.031)
TROPONIN I: 1.36 ng/mL — AB (ref <0.031)
TROPONIN I: 0.68 ng/mL — AB (ref <0.031)

## 2014-11-15 LAB — GLUCOSE, CAPILLARY: GLUCOSE-CAPILLARY: 110 mg/dL — AB (ref 65–99)

## 2014-11-15 LAB — PHOSPHORUS: PHOSPHORUS: 3.2 mg/dL (ref 2.5–4.6)

## 2014-11-15 LAB — MRSA PCR SCREENING: MRSA BY PCR: NEGATIVE

## 2014-11-15 LAB — MAGNESIUM: Magnesium: 1.6 mg/dL — ABNORMAL LOW (ref 1.7–2.4)

## 2014-11-15 MED ORDER — DIAZEPAM 5 MG PO TABS
5.0000 mg | ORAL_TABLET | Freq: Every day | ORAL | Status: DC
  Administered 2014-11-16 – 2014-11-18 (×3): 5 mg via ORAL
  Filled 2014-11-15 (×3): qty 1

## 2014-11-15 MED ORDER — SODIUM CHLORIDE 0.9 % IV SOLN: 250.0000 mL | INTRAVENOUS | Status: DC | PRN

## 2014-11-15 MED ORDER — LEVOTHYROXINE SODIUM 50 MCG PO TABS
50.0000 ug | ORAL_TABLET | Freq: Every day | ORAL | Status: DC
  Administered 2014-11-15 – 2014-11-18 (×3): 50 ug via ORAL
  Filled 2014-11-15 (×4): qty 1

## 2014-11-15 MED ORDER — MORPHINE SULFATE (PF) 4 MG/ML IV SOLN
INTRAVENOUS | Status: AC
  Administered 2014-11-15: 2.8 mg via INTRAVENOUS
  Filled 2014-11-15: qty 1

## 2014-11-15 MED ORDER — FOLIC ACID 1 MG PO TABS
3.0000 mg | ORAL_TABLET | Freq: Every day | ORAL | Status: DC
  Administered 2014-11-15 – 2014-11-18 (×4): 3 mg via ORAL
  Filled 2014-11-15 (×4): qty 3

## 2014-11-15 MED ORDER — TECHNETIUM TC 99M MEBROFENIN IV KIT
5.1500 | PACK | Freq: Once | INTRAVENOUS | Status: DC | PRN
  Administered 2014-11-15: 5 via INTRAVENOUS
  Filled 2014-11-15: qty 6

## 2014-11-15 MED ORDER — MAGNESIUM SULFATE 2 GM/50ML IV SOLN
2.0000 g | Freq: Once | INTRAVENOUS | Status: AC
  Administered 2014-11-15: 2 g via INTRAVENOUS
  Filled 2014-11-15: qty 50

## 2014-11-15 MED ORDER — ASPIRIN EC 81 MG PO TBEC
81.0000 mg | DELAYED_RELEASE_TABLET | Freq: Every day | ORAL | Status: DC
  Administered 2014-11-15 – 2014-11-18 (×4): 81 mg via ORAL
  Filled 2014-11-15 (×4): qty 1

## 2014-11-15 MED ORDER — PIPERACILLIN-TAZOBACTAM 3.375 G IVPB
3.3750 g | Freq: Three times a day (TID) | INTRAVENOUS | Status: DC
  Administered 2014-11-15 – 2014-11-17 (×7): 3.375 g via INTRAVENOUS
  Filled 2014-11-15 (×9): qty 50

## 2014-11-15 MED ORDER — MORPHINE SULFATE (PF) 4 MG/ML IV SOLN
2.8000 mg | Freq: Once | INTRAVENOUS | Status: AC
  Administered 2014-11-15: 2.8 mg via INTRAVENOUS

## 2014-11-15 MED ORDER — IBUPROFEN 200 MG PO TABS
400.0000 mg | ORAL_TABLET | ORAL | Status: DC | PRN
  Administered 2014-11-15: 400 mg via ORAL
  Filled 2014-11-15: qty 2

## 2014-11-15 MED ORDER — SODIUM CHLORIDE 0.9 % IV SOLN
INTRAVENOUS | Status: DC
  Administered 2014-11-15 – 2014-11-16 (×2): 1000 mL via INTRAVENOUS

## 2014-11-15 MED ORDER — ASPIRIN 300 MG RE SUPP: 300.0000 mg | RECTAL | Status: DC

## 2014-11-15 MED ORDER — AMLODIPINE BESYLATE 5 MG PO TABS
5.0000 mg | ORAL_TABLET | Freq: Every day | ORAL | Status: DC
  Administered 2014-11-15 – 2014-11-18 (×4): 5 mg via ORAL
  Filled 2014-11-15 (×4): qty 1

## 2014-11-15 MED ORDER — PANTOPRAZOLE SODIUM 40 MG PO TBEC
40.0000 mg | DELAYED_RELEASE_TABLET | Freq: Every day | ORAL | Status: DC
  Administered 2014-11-15 – 2014-11-18 (×4): 40 mg via ORAL
  Filled 2014-11-15 (×4): qty 1

## 2014-11-15 MED ORDER — ASPIRIN 81 MG PO CHEW
324.0000 mg | CHEWABLE_TABLET | ORAL | Status: DC
Start: 2014-11-15 — End: 2014-11-15

## 2014-11-15 MED ORDER — HEPARIN SODIUM (PORCINE) 5000 UNIT/ML IJ SOLN
5000.0000 [IU] | Freq: Three times a day (TID) | INTRAMUSCULAR | Status: DC
  Administered 2014-11-15 – 2014-11-18 (×10): 5000 [IU] via SUBCUTANEOUS
  Filled 2014-11-15 (×11): qty 1

## 2014-11-15 MED ORDER — MORPHINE SULFATE (PF) 4 MG/ML IV SOLN
4.0000 mg | INTRAVENOUS | Status: DC | PRN
Start: 2014-11-15 — End: 2014-11-18

## 2014-11-15 MED ORDER — METOPROLOL TARTRATE 25 MG PO TABS
25.0000 mg | ORAL_TABLET | Freq: Every day | ORAL | Status: DC
  Administered 2014-11-15 – 2014-11-17 (×3): 25 mg via ORAL
  Filled 2014-11-15 (×3): qty 1

## 2014-11-15 MED ORDER — PREDNISONE 5 MG PO TABS
5.0000 mg | ORAL_TABLET | ORAL | Status: DC
  Administered 2014-11-15 – 2014-11-17 (×2): 5 mg via ORAL
  Filled 2014-11-15 (×2): qty 1

## 2014-11-15 MED ORDER — ONDANSETRON HCL 4 MG/2ML IJ SOLN: 4.0000 mg | Freq: Four times a day (QID) | INTRAMUSCULAR | Status: DC | PRN

## 2014-11-15 NOTE — Progress Notes (Signed)
ANTIBIOTIC CONSULT NOTE - INITIAL  Pharmacy Consult for Zosyn  Indication: Intra-abdominal infection  Allergies  Allergen Reactions  . Ciprofloxacin Other (See Comments)    REACTION: mild delirium    Patient Measurements: Weight: 152 lb 3.2 oz (69.037 kg)   Vital Signs: Temp: 100.9 F (38.3 C) (08/19 1816) Temp Source: Rectal (08/19 1816) BP: 121/49 mmHg (08/19 2215) Pulse Rate: 74 (08/19 2215)  Labs:  Recent Labs  11/14/14 1730  WBC 10.5  HGB 10.2*  PLT 54*  CREATININE 1.13   Estimated Creatinine Clearance: 41.6 mL/min (by C-G formula based on Cr of 1.13).  Medical History: Past Medical History  Diagnosis Date  . Hypertension   . GERD (gastroesophageal reflux disease)   . Thyroid disease     HYPOTHYROIDISM  . Aortic stenosis, severe   . Embolism involving retinal artery   . BPH (benign prostatic hypertrophy)     Dr. Retta Diones  . Iliac artery aneurysm     Dr. Edilia Bo  . AAA (abdominal aortic aneurysm)     Dr. Ashley Royalty  . Diastolic heart failure   . 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 Coronary artery disease     Left main disease and severe 3-vessel CAD  . CAD (coronary artery disease) of bypass graft     Chronic occlusion of saphenous vein graft to RCA  . Hypothyroidism   . Shortness of breath   . Heart murmur   . Stroke     mini stroke effective his eye left  . Arthritis   . 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 Atrial fibrillation    Assessment: Zosyn for probable cholecystitis, also with UTI, WBC WNL, renal function ok, other labs reviewed.   Plan:  -Zosyn 3.375G IV q8h to be infused over 4 hours -F/U infectious work-up  Abran Duke 11/15/2014,12:12 AM

## 2014-11-15 NOTE — Care Management Note (Addendum)
Case Management Note  Patient Details  Name: [name] MRN: [id number] Date of Birth: 07-09-1927  Subjective/Objective:                 Pt from home with wife admitted with Sepsis, ?UTI/ Cholecystitis related.   Action/Plan: Discharge planning.  Expected Discharge Date:                  Expected Discharge Plan:  Home/Self Care  In-House Referral:     Discharge planning Services  CM Consult  Post Acute Care Choice:    Choice offered to:     DME Arranged:    DME Agency:     HH Arranged:    HH Agency:     Status of Service:  In process, will continue to follow  Medicare Important Message Given:    Date Medicare IM Given:    Medicare IM give by:    Date Additional Medicare IM Given:    Additional Medicare Important Message give by:     If discussed at Long Length of Stay Meetings, dates discussed:    Additional Comments: CHRISTOPH COPELAN ([address]  Encarnacion Scioneaux [facility]) [phone]  Gae Gallop [place], [place] [phone] 11/15/2014, 8:08 PM

## 2014-11-15 NOTE — Progress Notes (Addendum)
[facility] PROGRESS NOTE   Assessment/Plan: Sepsis due to Urinary tract infection/Cholecystitis - Lactic acidosis improve with IV hydration, he was started on empiric antibiotics Zosyn on 11/15/2014. - Spite antibiotics he continues to spikes fevers, blood cultures and urine cultures are pending. - Surgery was consulted who recommended a HIDA scan which is pending at this point.  Elevated troponin I level: - In the setting of sepsis likely demand ischemia and an 81 year old. Cardiology consulted.   Elevated LFTs - Unclear etiology, concern about cholecystitis although there is a 2:1 split AST greater than ALT which brings into question shock liver and urinary tract infection in the differential versus medications . Alcohol abuse.   Thrombocytopenia: Likely due to sepsis, is improving now.  Normocytic anemia: Only further follow-up as an outpatient.  Code Status: full Family Communication: none  Disposition Plan: inpatient   Consultants:  Surgery  cardiology  Procedures:  CT abd and pelvis  HIDA scan pending  Antibiotics:  Zosyn  HPI/Subjective: Feels just slightly better, no abdominal pain. Chest free no SOB.  Objective: Filed Vitals:   11/14/14 2215 11/15/14 0000 11/15/14 0300 11/15/14 0645  BP: 121/49 153/55 157/57   Pulse: 74 89 69   Temp:  98.9 F (37.2 C) 98.7 F (37.1 C) 102.3 F (39.1 C)  TempSrc:  Oral Oral Oral  Resp: 16 16 18    Height:  5\' 7"  (1.702 m)    Weight:  70.2 kg (154 lb 12.2 oz) 70.2 kg (154 lb 12.2 oz)   SpO2: 95% 100% 97%     Intake/Output Summary (Last 24 hours) at 11/15/14 0713 Last data filed at 11/15/14 0600  Gross per 24 hour  Intake    990 ml  Output    400 ml  Net    590 ml   Filed Weights   11/14/14 1817 11/15/14 0000 11/15/14 0300  Weight: 69.037 kg (152 lb 3.2 oz) 70.2 kg (154 lb 12.2 oz) 70.2 kg (154 lb 12.2 oz)    Exam:  General: Alert, awake, oriented x3, in no acute distress.  HEENT: No  bruits, no goiter.  Heart: Regular rate and rhythm. Lungs: Good air movement, bilateral air movement.  Abdomen: Soft, nontender, no upper quadrant tenderness, no suprapubic tenderness, nondistended, positive bowel sounds.  Neuro: Grossly intact, nonfocal.   Data Reviewed: Basic Metabolic Panel:  Recent Labs Lab 11/14/14 1730 11/15/14 0329  NA 130*  --   K 3.6  --   CL 98*  --   CO2 21*  --   GLUCOSE 99  --   BUN 19  --   CREATININE 1.13  --   CALCIUM 8.4*  --   MG  --  1.6*  PHOS  --  3.2   Liver Function Tests:  Recent Labs Lab 11/14/14 1730  AST 412*  ALT 164*  ALKPHOS 410*  BILITOT 3.0*  PROT 5.7*  ALBUMIN 3.3*    Recent Labs Lab 11/14/14 1730  LIPASE 43   No results for input(s): AMMONIA in the last 168 hours. CBC:  Recent Labs Lab 11/14/14 1730 11/15/14 0329  WBC 10.5 10.6*  NEUTROABS 9.1*  --   HGB 10.2* 10.4*  HCT 31.0* 31.8*  MCV 89.6 89.1  PLT 54* 101*   Cardiac Enzymes:  Recent Labs Lab 11/14/14 1730 11/14/14 2219 11/15/14 0329  TROPONINI 0.42* 0.66* 0.62*   BNP (last 3 results) No results for input(s): BNP in the last 8760 hours.  ProBNP (last 3 results) No results for  input(s): PROBNP in the last 8760 hours.  CBG: No results for input(s): GLUCAP in the last 168 hours.  Recent Results (from the past 240 hour(s))  MRSA PCR Screening     Status: None   Collection Time: 11/15/14  1:45 AM  Result Value Ref Range Status   MRSA by PCR NEGATIVE NEGATIVE Final    Comment:        The GeneXpert MRSA Assay (FDA approved for NASAL specimens only), is one component of a comprehensive MRSA colonization surveillance program. It is not intended to diagnose MRSA infection nor to guide or monitor treatment for MRSA infections.      Studies: Dg Chest 2 View  11/14/2014   CLINICAL DATA:  Shortness of breath and low grade fever this evening.  EXAM: CHEST  2 VIEW  COMPARISON:  February 19, 2014  FINDINGS: The heart size and  mediastinal contours are stable. Heart size is enlarged. Patient is status post prior median sternotomy, CABG, transcatheter aortic valve replacement. There is no focal infiltrate, pulmonary edema, or pleural effusion. The visualized skeletal structures are stable.  IMPRESSION: No active cardiopulmonary disease.   Electronically Signed   By: Sherian Rein M.D.   On: 11/14/2014 19:14   Ct Abdomen Pelvis W Contrast  11/14/2014   CLINICAL DATA:  Fever and abdominal pain.  Initial encounter.  EXAM: CT ABDOMEN AND PELVIS WITH CONTRAST  TECHNIQUE: Multidetector CT imaging of the abdomen and pelvis was performed using the standard protocol following bolus administration of intravenous contrast.  CONTRAST:  OMNIPAQUE IOHEXOL 300 MG/ML  SOLN  COMPARISON:  05/23/2014 [facility].  FINDINGS: Musculoskeletal: No aggressive osseous lesions. Severe degenerative disc disease. LEFT hip hemiarthroplasty. RIGHT hip osteoarthritis.  Lung Bases: Atelectasis.  Liver:  Mild within normal limits.  Spleen:  Normal.  Gallbladder: Distended. There is pericholecystic fluid in the gallbladder fossa. No calcified stones are identified.  Common bile duct: Common bile duct appears within normal limits. No calcified common duct stone identified.  Pancreas:  Atrophic.  No mass lesion or inflammatory change.  Adrenal glands:  Normal bilaterally.  Kidneys: Renal scarring is present. Artifactual decreased enhancement in the RIGHT inferior renal pole from extremities. LEFT renal scarring. Excretion of contrast appears normal bilaterally.  Stomach: Hyper trophic appearance of the stomach proximally, suggesting hypertrophic gastritis. No change from prior.  Small bowel:  No small bowel obstruction or inflammatory change.  Colon: Large stool burden. Colonic diverticulosis. No diverticulitis.  Pelvic Genitourinary: Thickening of the urinary bladder with cellules along the LEFT lateral wall. Bladder is partially obscured by artifact from LEFT hip  arthroplasty.  Peritoneum: No free air.  Vascular/lymphatic: Aortoiliac aneurysms bilaterally appear similar to the prior exam. No rupture or acute vascular abnormality. No retroperitoneal hematoma/ hemorrhage. No extravasation of contrast.  Body Wall: Fat containing subxiphoid hernia.  IMPRESSION: 1. Pericholecystic fluid is new since the prior exam and is potentially associated with acute cholecystitis. Correlate with laboratory parameters and consider a RIGHT upper quadrant ultrasound if warranted. 2. Unchanged iliofemoral aneurysms. 3. Bladder wall thickening and cellules suggesting chronic bladder outflow obstruction. Recommend correlation with urinalysis for cystitis.   Electronically Signed   By: Andreas Newport M.D.   On: 11/14/2014 19:32    Scheduled Meds: . amLODipine  5 mg Oral Q1500  . aspirin EC  81 mg Oral Daily  . diazepam  5 mg Oral Q1500  . folic acid  3 mg Oral Daily  . heparin  5,000 Units Subcutaneous 3 times per day  .  levothyroxine  50 mcg Oral QAC breakfast  . magnesium sulfate 1 - 4 g bolus IVPB  2 g Intravenous Once  . metoprolol tartrate  25 mg Oral Daily  . pantoprazole  40 mg Oral Daily  . piperacillin-tazobactam (ZOSYN)  IV  3.375 g Intravenous 3 times per day  . predniSONE  5 mg Oral QODAY   Continuous Infusions: . sodium chloride 1,000 mL (11/15/14 0148)    Time Spent: 35 min   FELIZ Rosine Beat  [facility] Pager [phone]. If 7PM-7AM, please contact night-coverage at [email or web address], password [facility] 11/15/2014, 7:13 AM  LOS: 1 day

## 2014-11-15 NOTE — Progress Notes (Signed)
Patient ID: Hunter Choi, male   DOB: 11-16-1927, 81 y.o.   MRN: [id number]  Patient is in nuclear medicine now for HIDA scan.  Will follow-up on study and leave recommendations.  Wilmon Arms. Corliss Skains, MD, [facility] Surgery  General/ Trauma Surgery  11/15/2014 9:48 AM

## 2014-11-15 NOTE — Consult Note (Signed)
Reason for Consult:possible cholecystitis Referring Physician: Dr Jannetta Quint Hunter Choi is an 81 y.o. male.  HPI: 81 year old male with hypertension, coronary artery disease, history of heart failure comes in with fever, nausea, vomiting, "upset stomach". He states the symptoms started on Friday. He denies any prior episodes. He came to the emergency room for evaluation. He was found to have a distended gallbladder and pericholecystic fluid on CT. He also had some transient hypotension, elevated lactate & troponin, urinary tract infection and was diagnosed with sepsis. We were asked to comment about potential cholecystitis. He also had elevated LFTs.  Interestingly he was admitted in February of this past year and November of last year with sepsis in the setting of nausea, vomiting, and acute kidney injury. 1 admission he was noted to have gastroenteritis.  He denies any acholic stools. He denies any jaundice. He denies any significant weight change. He denies any chest pain. He denies any shortness of breath. He denies any jaw or arm pain. He uses a walker to get around.  Past Medical History  Diagnosis Date  . Hypertension   . GERD (gastroesophageal reflux disease)   . Thyroid disease     HYPOTHYROIDISM  . Aortic stenosis, severe   . Embolism involving retinal artery   . BPH (benign prostatic hypertrophy)     Dr. Diona Fanti  . Iliac artery aneurysm     Dr. Scot Dock  . AAA (abdominal aortic aneurysm)     Dr. Rosalia Hammers  . Diastolic heart failure   . 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 Coronary artery disease     Left main disease and severe 3-vessel CAD  . CAD (coronary artery disease) of bypass graft     Chronic occlusion of saphenous vein graft to RCA  . Hypothyroidism   . Shortness of breath   . Heart murmur   . Stroke     mini stroke effective his eye left  . Arthritis   . 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 Atrial fibrillation     Past Surgical History  Procedure Laterality Date  . Left hip hemiarthropasty      AFTER HIP FX  . Coronary artery bypass graft      1999..Dr. Lanelle Bal  . Total knee arthroplasty      right 1992   left  2002  . Biopsy prostate      2005  . Prostate surgery  2012    TURP  . Joint replacement  2007    THR  . Harvest bone graft  1982    FOR THE  ANKLE  . Skin grafts      1968  . Colonoscopy    . Toe surgery Bilateral   . Transcatheter aortic valve replacement, transapical N/A 01/15/2013    Procedure: TRANSCATHETER AORTIC VALVE REPLACEMENT, TRANSAPICAL;  Surgeon: Gaye Pollack, MD;  Location: [facility] OR;  Service: Open Heart Surgery;  Laterality: N/A;  . Intraoperative transesophageal echocardiogram N/A 01/15/2013    Procedure: INTRAOPERATIVE TRANSESOPHAGEAL ECHOCARDIOGRAM;  Surgeon: Gaye Pollack, MD;  Location: [facility] OR;  Service: Open Heart Surgery;  Laterality: N/A;  . Left and right heart catheterization with coronary angiogram N/A 12/20/2012    Procedure: LEFT AND RIGHT HEART CATHETERIZATION WITH CORONARY  Cyril Loosen;  Surgeon: Sinclair Grooms, MD;  Location: [facility] CATH LAB;  Service: Cardiovascular;  Laterality: N/A;  . Carotid endarterectomy Right Jan. 2, 1997    CE    Family History  Problem Relation Age of Onset  . Heart attack Mother   . Heart disease Mother     Before age 34  . Hyperlipidemia Mother   . Hypertension Mother   . Hypertension Son   . Heart disease Father     AAA-   . Hyperlipidemia Father   . Heart attack Father   . Hypertension Father   . Heart disease Brother     Before age 80  . Hyperlipidemia Brother   . Heart attack Brother   . Hypertension Brother     Social History:  reports that he quit smoking about 56 years ago. His smoking use included Cigarettes. He has a 23.5  pack-year smoking history. He has quit using smokeless tobacco. His smokeless tobacco use included Chew. He reports that he does not drink alcohol or use illicit drugs.  Allergies:  Allergies  Allergen Reactions  . Ciprofloxacin Other (See Comments)    REACTION: mild delirium    Medications: I have reviewed the patient's current medications.  Results for orders placed or performed during the hospital encounter of 11/14/14 (from the past 48 hour(s))  Comprehensive metabolic panel     Status: Abnormal   Collection Time: 11/14/14  5:30 PM  Result Value Ref Range   Sodium 130 (L) 135 - 145 mmol/L   Potassium 3.6 3.5 - 5.1 mmol/L   Chloride 98 (L) 101 - 111 mmol/L   CO2 21 (L) 22 - 32 mmol/L   Glucose, Bld 99 65 - 99 mg/dL   BUN 19 6 - 20 mg/dL   Creatinine, Ser 1.13 0.61 - 1.24 mg/dL   Calcium 8.4 (L) 8.9 - 10.3 mg/dL   Total Protein 5.7 (L) 6.5 - 8.1 g/dL   Albumin 3.3 (L) 3.5 - 5.0 g/dL   AST 412 (H) 15 - 41 U/L   ALT 164 (H) 17 - 63 U/L   Alkaline Phosphatase 410 (H) 38 - 126 U/L   Total Bilirubin 3.0 (H) 0.3 - 1.2 mg/dL   GFR calc non Af Amer 57 (L) >60 mL/min   GFR calc Af Amer >60 >60 mL/min    Comment: (NOTE) The eGFR has been calculated using the CKD EPI equation. This calculation has not been validated in all clinical situations. eGFR's persistently <60 mL/min signify possible Chronic Kidney Disease.    Anion gap 11 5 - 15  CBC WITH DIFFERENTIAL     Status: Abnormal   Collection Time: 11/14/14  5:30 PM  Result Value Ref Range   WBC 10.5 4.0 - 10.5 K/uL   RBC 3.46 (L) 4.22 - 5.81 MIL/uL   Hemoglobin 10.2 (L) 13.0 - 17.0 g/dL   HCT 31.0 (L) 39.0 - 52.0 %   MCV 89.6 78.0 - 100.0 fL   MCH 29.5 26.0 - 34.0 pg   MCHC 32.9 30.0 - 36.0 g/dL   RDW 17.5 (H) 11.5 - 15.5 %   Platelets 54 (L) 150 - 400 K/uL    Comment: REPEATED TO VERIFY SPECIMEN CHECKED FOR CLOTS PLATELET COUNT CONFIRMED BY SMEAR    Neutrophils Relative % 86 (H) 43 - 77 %   Neutro Abs 9.1 (H) 1.7 - 7.7  K/uL   Lymphocytes Relative 4 (L) 12 - 46 %   Lymphs Abs 0.4 (L) 0.7 -  4.0 K/uL   Monocytes Relative 9 3 - 12 %   Monocytes Absolute 1.0 0.1 - 1.0 K/uL   Eosinophils Relative 0 0 - 5 %   Eosinophils Absolute 0.0 0.0 - 0.7 K/uL   Basophils Relative 0 0 - 1 %   Basophils Absolute 0.0 0.0 - 0.1 K/uL  Lipase, blood     Status: None   Collection Time: 11/14/14  5:30 PM  Result Value Ref Range   Lipase 43 22 - 51 U/L  Troponin I     Status: Abnormal   Collection Time: 11/14/14  5:30 PM  Result Value Ref Range   Troponin I 0.42 (H) <0.031 ng/mL    Comment:        PERSISTENTLY INCREASED TROPONIN VALUES IN THE RANGE OF 0.04-0.49 ng/mL CAN BE SEEN IN:       -UNSTABLE ANGINA       -CONGESTIVE HEART FAILURE       -MYOCARDITIS       -CHEST TRAUMA       -ARRYHTHMIAS       -LATE PRESENTING MYOCARDIAL INFARCTION       -COPD   CLINICAL FOLLOW-UP RECOMMENDED.   I-Stat CG4 Lactic Acid, ED  (not at  [facility])     Status: Abnormal   Collection Time: 11/14/14  5:46 PM  Result Value Ref Range   Lactic Acid, Venous 2.22 (HH) 0.5 - 2.0 mmol/L   Comment NOTIFIED PHYSICIAN   Urinalysis, Routine w reflex microscopic (not at [facility])     Status: Abnormal   Collection Time: 11/14/14  6:49 PM  Result Value Ref Range   Color, Urine AMBER (A) YELLOW    Comment: BIOCHEMICALS MAY BE AFFECTED BY COLOR   APPearance CLOUDY (A) CLEAR   Specific Gravity, Urine 1.016 1.005 - 1.030   pH 6.0 5.0 - 8.0   Glucose, UA NEGATIVE NEGATIVE mg/dL   Hgb urine dipstick MODERATE (A) NEGATIVE   Bilirubin Urine MODERATE (A) NEGATIVE   Ketones, ur NEGATIVE NEGATIVE mg/dL   Protein, ur 30 (A) NEGATIVE mg/dL   Urobilinogen, UA 4.0 (H) 0.0 - 1.0 mg/dL   Nitrite POSITIVE (A) NEGATIVE   Leukocytes, UA LARGE (A) NEGATIVE  Urine microscopic-add on     Status: Abnormal   Collection Time: 11/14/14  6:49 PM  Result Value Ref Range   Squamous Epithelial / LPF RARE RARE   WBC, UA TOO NUMEROUS TO COUNT <3 WBC/hpf   RBC / HPF 7-10 <3  RBC/hpf   Bacteria, UA FEW (A) RARE  I-Stat CG4 Lactic Acid, ED  (not at  [facility])     Status: None   Collection Time: 11/14/14  8:08 PM  Result Value Ref Range   Lactic Acid, Venous 1.01 0.5 - 2.0 mmol/L  Troponin I     Status: Abnormal   Collection Time: 11/14/14 10:19 PM  Result Value Ref Range   Troponin I 0.66 (HH) <0.031 ng/mL    Comment:        POSSIBLE MYOCARDIAL ISCHEMIA. SERIAL TESTING RECOMMENDED. CRITICAL RESULT CALLED TO, READ BACK BY AND VERIFIED WITH: RINGLEY H,RN 11/14/14 V[place]     Dg Chest 2 View  11/14/2014   CLINICAL DATA:  Shortness of breath and low grade fever this evening.  EXAM: CHEST  2 VIEW  COMPARISON:  February 19, 2014  FINDINGS: The heart size and mediastinal contours are stable. Heart size is enlarged. Patient is status post prior median sternotomy, CABG, transcatheter aortic valve replacement. There is no  focal infiltrate, pulmonary edema, or pleural effusion. The visualized skeletal structures are stable.  IMPRESSION: No active cardiopulmonary disease.   Electronically Signed   By: Abelardo Diesel M.D.   On: 11/14/2014 19:14   Ct Abdomen Pelvis W Contrast  11/14/2014   CLINICAL DATA:  Fever and abdominal pain.  Initial encounter.  EXAM: CT ABDOMEN AND PELVIS WITH CONTRAST  TECHNIQUE: Multidetector CT imaging of the abdomen and pelvis was performed using the standard protocol following bolus administration of intravenous contrast.  CONTRAST:  12m OMNIPAQUE IOHEXOL 300 MG/ML  SOLN  COMPARISON:  05/23/2014 CTA.  FINDINGS: Musculoskeletal: No aggressive osseous lesions. Severe degenerative disc disease. LEFT hip hemiarthroplasty. RIGHT hip osteoarthritis.  Lung Bases: Atelectasis.  Liver:  Mild within normal limits.  Spleen:  Normal.  Gallbladder: Distended. There is pericholecystic fluid in the gallbladder fossa. No calcified stones are identified.  Common bile duct: Common bile duct appears within normal limits. No calcified common duct stone identified.   Pancreas:  Atrophic.  No mass lesion or inflammatory change.  Adrenal glands:  Normal bilaterally.  Kidneys: Renal scarring is present. Artifactual decreased enhancement in the RIGHT inferior renal pole from extremities. LEFT renal scarring. Excretion of contrast appears normal bilaterally.  Stomach: Hyper trophic appearance of the stomach proximally, suggesting hypertrophic gastritis. No change from prior.  Small bowel:  No small bowel obstruction or inflammatory change.  Colon: Large stool burden. Colonic diverticulosis. No diverticulitis.  Pelvic Genitourinary: Thickening of the urinary bladder with cellules along the LEFT lateral wall. Bladder is partially obscured by artifact from LEFT hip arthroplasty.  Peritoneum: No free air.  Vascular/lymphatic: Aortoiliac aneurysms bilaterally appear similar to the prior exam. No rupture or acute vascular abnormality. No retroperitoneal hematoma/ hemorrhage. No extravasation of contrast.  Body Wall: Fat containing subxiphoid hernia.  IMPRESSION: 1. Pericholecystic fluid is new since the prior exam and is potentially associated with acute cholecystitis. Correlate with laboratory parameters and consider a RIGHT upper quadrant ultrasound if warranted. 2. Unchanged iliofemoral aneurysms. 3. Bladder wall thickening and cellules suggesting chronic bladder outflow obstruction. Recommend correlation with urinalysis for cystitis.   Electronically Signed   By: GDereck LigasM.D.   On: 11/14/2014 19:32    Review of Systems  Constitutional: Negative for fever and chills.       Uses walker. Lives with wife and son  HENT: Positive for hearing loss.   Respiratory: Negative for shortness of breath.   Cardiovascular: Positive for leg swelling (occasional). Negative for chest pain, palpitations and orthopnea.  Gastrointestinal: Positive for nausea, vomiting and abdominal pain.  Genitourinary: Positive for frequency. Negative for dysuria.  Skin: Negative for itching and rash.    Neurological: Negative for tremors, seizures and loss of consciousness.  Psychiatric/Behavioral: Negative for suicidal ideas and substance abuse.  All other systems reviewed and are negative.  Blood pressure 153/55, pulse 89, temperature 98.9 F (37.2 C), temperature source Oral, resp. rate 16, height 5' 7"  (1.702 m), weight 70.2 kg (154 lb 12.2 oz), SpO2 100 %. Physical Exam  Vitals reviewed. Constitutional: He is oriented to person, place, and time. He appears well-developed and well-nourished. No distress.  Elderly wm in nad, nontoxic; resting comfortably. Slightly unkempt  HENT:  Head: Normocephalic and atraumatic.  Right Ear: External ear normal.  Left Ear: External ear normal.  Eyes: Conjunctivae are normal. No scleral icterus.  Neck: Normal range of motion. Neck supple. No tracheal deviation present. No thyromegaly present.  Cardiovascular: Normal rate and normal heart sounds.   Pulses:  Radial pulses are 2+ on the right side, and 2+ on the left side.       Femoral pulses are 2+ on the right side, and 2+ on the left side. Respiratory: Effort normal and breath sounds normal. No stridor. No respiratory distress. He has no wheezes.    GI: Soft. Normal appearance. There is no tenderness. There is no rigidity and no guarding.  Musculoskeletal: He exhibits no edema or tenderness.  Lymphadenopathy:    He has no cervical adenopathy.  Neurological: He is alert and oriented to person, place, and time. He exhibits normal muscle tone.  Skin: Skin is warm and dry. No rash noted. He is not diaphoretic. No erythema. No pallor.  Psychiatric: He has a normal mood and affect. His behavior is normal. Judgment and thought content normal.    Assessment/Plan: Sepsis Elevated LFTs Abnormal radiological appearance of gallbladder Elevated troponin - NSTEMI UTI Anemia of chronic disease CAD H/o HTN H/o diastolic heart failure  His elevated LFTs and the pericholecystic fluid is  suggestive of acute acalculous cholecystitis even though he is nontender on exam. It is possible that this along with his urinary tract infection is driving his sepsis and cardiac strain. His 2 prior admissions with nausea and vomiting with mild sepsis is curious. Given his age and comorbidities I think he does need a confirmatory test for cholecystitis.  Check nuclear medicine scan of gallbladder in the morning Follow LFTs Continue Zosyn Nothing by mouth  Leighton Ruff. Redmond Pulling, MD, FACS General, Bariatric, & Minimally Invasive Surgery [facility], [place]   [facility] M 11/15/2014, 1:10 AM

## 2014-11-15 NOTE — Progress Notes (Signed)
Temp 102.3 orally.  [facility] paged, awaiting call back.

## 2014-11-16 ENCOUNTER — Encounter (HOSPITAL_COMMUNITY): Payer: Self-pay | Admitting: Radiology

## 2014-11-16 LAB — PROTIME-INR
INR: 1.3 (ref 0.00–1.49)
Prothrombin Time: 16.3 seconds — ABNORMAL HIGH (ref 11.6–15.2)

## 2014-11-16 LAB — BASIC METABOLIC PANEL
ANION GAP: 9 (ref 5–15)
BUN: 15 mg/dL (ref 6–20)
CHLORIDE: 100 mmol/L — AB (ref 101–111)
CO2: 19 mmol/L — AB (ref 22–32)
Calcium: 7.9 mg/dL — ABNORMAL LOW (ref 8.9–10.3)
Creatinine, Ser: 1.21 mg/dL (ref 0.61–1.24)
GFR calc Af Amer: >60 mL/min (ref >60)
GFR, EST NON AFRICAN AMERICAN: 53 mL/min — AB (ref >60)
GLUCOSE: 172 mg/dL — AB (ref 65–99)
POTASSIUM: 3.8 mmol/L (ref 3.5–5.1)
Sodium: 128 mmol/L — ABNORMAL LOW (ref 135–145)

## 2014-11-16 LAB — MAGNESIUM: Magnesium: 2.1 mg/dL (ref 1.7–2.4)

## 2014-11-16 LAB — TROPONIN I: TROPONIN I: 0.59 ng/mL — AB (ref <0.031)

## 2014-11-16 MED ORDER — SODIUM BICARBONATE 8.4 % IV SOLN: INTRAVENOUS | Status: DC

## 2014-11-16 MED ORDER — SODIUM CHLORIDE 0.45 % IV SOLN
INTRAVENOUS | Status: DC
  Administered 2014-11-16: 16:00:00 via INTRAVENOUS
  Filled 2014-11-16 (×3): qty 1000

## 2014-11-16 MED ORDER — ACETAMINOPHEN 325 MG PO TABS
650.0000 mg | ORAL_TABLET | Freq: Four times a day (QID) | ORAL | Status: DC | PRN
  Administered 2014-11-16: 650 mg via ORAL
  Filled 2014-11-16: qty 2

## 2014-11-16 MED ORDER — SODIUM CHLORIDE 0.9 % IV SOLN: INTRAVENOUS | Status: DC

## 2014-11-16 MED ORDER — SODIUM BICARBONATE 8.4 % IV SOLN
INTRAVENOUS | Status: DC
  Filled 2014-11-16: qty 1000

## 2014-11-16 NOTE — Progress Notes (Signed)
[facility] PROGRESS NOTE   Assessment/Plan: Sepsis due to Urinary tract infection/Cholecystitis - Continue empiric antibiotic Zosyn started on 11/15/2014. - Despite antibiotics he continues to spikes fevers, blood cultures and urine cultures are pending. - Hydroscan showed high probability for gallbladder disease awaiting surgery recommendations.  Elevated troponin I level: - In the setting of sepsis likely demand ischemia and an 81 year old. Cardiology consulted. - Cardiac biomarkers are trending down.  Elevated LFTs - Unclear etiology, concern about cholecystitis although there is a 2:1 split AST greater than ALT which brings into question shock liver and urinary tract infection in the differential versus medications . Alcohol abuse.   Thrombocytopenia: Likely due to sepsis, is improving now.  Normocytic anemia: Only further follow-up as an outpatient. - HBG stable.  Code Status: full Family Communication: none  Disposition Plan: inpatient   Consultants:  Surgery  cardiology  Procedures:  CT abd and pelvis  HIDA scan pending  Antibiotics:  Zosyn  HPI/Subjective: Feels just slightly better, pt is really hungry Chest free no SOB.  Objective: Filed Vitals:   11/15/14 2333 11/16/14 0122 11/16/14 0335 11/16/14 0404  BP: 146/49  119/47   Pulse: 66  68   Temp: 98 F (36.7 C) 101.1 F (38.4 C) 101.1 F (38.4 C)   TempSrc: Oral Oral Oral   Resp: 16  18   Height:      Weight:    71.2 kg (156 lb 15.5 oz)  SpO2: 96%  93%     Intake/Output Summary (Last 24 hours) at 11/16/14 0724 Last data filed at 11/16/14 0600  Gross per 24 hour  Intake   3025 ml  Output    300 ml  Net   2725 ml   Filed Weights   11/15/14 0000 11/15/14 0300 11/16/14 0404  Weight: 70.2 kg (154 lb 12.2 oz) 70.2 kg (154 lb 12.2 oz) 71.2 kg (156 lb 15.5 oz)    Exam:  General: Alert, awake, oriented x3, in no acute distress.  HEENT: No bruits, no goiter.  Heart: Regular  rate and rhythm. Lungs: Good air movement, bilateral air movement.  Abdomen: Soft, nontender, no upper quadrant tenderness, no suprapubic tenderness, nondistended, positive bowel sounds.  Neuro: Grossly intact, nonfocal.   Data Reviewed: Basic Metabolic Panel:  Recent Labs Lab 11/14/14 1730 11/15/14 0329 11/15/14 0624  NA 130*  --  131*  K 3.6  --  3.9  CL 98*  --  100*  CO2 21*  --  22  GLUCOSE 99  --  135*  BUN 19  --  14  CREATININE 1.13  --  1.10  CALCIUM 8.4*  --  8.0*  MG  --  1.6*  --   PHOS  --  3.2  --    Liver Function Tests:  Recent Labs Lab 11/14/14 1730 11/15/14 0624  AST 412* 281*  ALT 164* 182*  ALKPHOS 410* 385*  BILITOT 3.0* 2.7*  PROT 5.7* 5.8*  ALBUMIN 3.3* 3.0*    Recent Labs Lab 11/14/14 1730  LIPASE 43   No results for input(s): AMMONIA in the last 168 hours. CBC:  Recent Labs Lab 11/14/14 1730 11/15/14 0329  WBC 10.5 10.6*  NEUTROABS 9.1*  --   HGB 10.2* 10.4*  HCT 31.0* 31.8*  MCV 89.6 89.1  PLT 54* 101*   Cardiac Enzymes:  Recent Labs Lab 11/15/14 0329 11/15/14 0624 11/15/14 1235 11/15/14 1835 11/15/14 2320  TROPONINI 0.62* 0.83* 1.36* 0.68* 0.59*   BNP (last 3 results) No  results for input(s): BNP in the last 8760 hours.  ProBNP (last 3 results) No results for input(s): PROBNP in the last 8760 hours.  CBG:  Recent Labs Lab 11/15/14 1101  GLUCAP 110*    Recent Results (from the past 240 hour(s))  Blood Culture (routine x 2)     Status: None (Preliminary result)   Collection Time: 11/14/14  5:30 PM  Result Value Ref Range Status   Specimen Description BLOOD LEFT HAND  Final   Special Requests BOTTLES DRAWN AEROBIC AND ANAEROBIC 5CC  Final   Culture NO GROWTH < 24 HOURS  Final   Report Status PENDING  Incomplete  Blood Culture (routine x 2)     Status: None (Preliminary result)   Collection Time: 11/14/14  5:33 PM  Result Value Ref Range Status   Specimen Description BLOOD LEFT FOREARM  Final    Special Requests BOTTLES DRAWN AEROBIC AND ANAEROBIC 5CC  Final   Culture NO GROWTH < 24 HOURS  Final   Report Status PENDING  Incomplete  Urine culture     Status: None (Preliminary result)   Collection Time: 11/14/14  6:49 PM  Result Value Ref Range Status   Specimen Description URINE, RANDOM  Final   Special Requests NONE  Final   Culture NO GROWTH < 24 HOURS  Final   Report Status PENDING  Incomplete  MRSA PCR Screening     Status: None   Collection Time: 11/15/14  1:45 AM  Result Value Ref Range Status   MRSA by PCR NEGATIVE NEGATIVE Final    Comment:        The GeneXpert MRSA Assay (FDA approved for NASAL specimens only), is one component of a comprehensive MRSA colonization surveillance program. It is not intended to diagnose MRSA infection nor to guide or monitor treatment for MRSA infections.      Studies: Dg Chest 2 View  11/14/2014   CLINICAL DATA:  Shortness of breath and low grade fever this evening.  EXAM: CHEST  2 VIEW  COMPARISON:  February 19, 2014  FINDINGS: The heart size and mediastinal contours are stable. Heart size is enlarged. Patient is status post prior median sternotomy, CABG, transcatheter aortic valve replacement. There is no focal infiltrate, pulmonary edema, or pleural effusion. The visualized skeletal structures are stable.  IMPRESSION: No active cardiopulmonary disease.   Electronically Signed   By: Sherian Rein M.D.   On: 11/14/2014 19:14   Nm Hepatobiliary Liver Func  11/15/2014   CLINICAL DATA:  Cholecystitis on prior CT.  EXAM: NUCLEAR MEDICINE HEPATOBILIARY IMAGING  TECHNIQUE: Sequential images of the abdomen were obtained out to 60 minutes following intravenous administration of radiopharmaceutical.  RADIOPHARMACEUTICALS:  5.15 MCi Tc-40m  Choletec IV  COMPARISON:  CT 11/14/2014.  FINDINGS: There is prompt hepatic uptake of radiotracer. Excretion of contrast into the common bile duct is visible as expected at between 10 and 15 minutes. Normal  spill of radiotracer into the duodenum.  There was nonvisualization of the gallbladder after 1 hour. The study was continued with administration of IV morphine. Continued imaging failed to demonstrate the gallbladder. The findings are compatible with acute cholecystitis without common bile duct obstruction.  Liver washout is relatively poor, suggesting an element of hepatic failure.  IMPRESSION: Nonvisualization of the gallbladder compatible with acute cholecystitis. Poor washout of radiotracer in the liver suggests an element of hepatic dysfunction or partial obstruction of the common bile duct.   Electronically Signed   By: Charolette Child.D.  On: 11/15/2014 12:30   Ct Abdomen Pelvis W Contrast  11/14/2014   CLINICAL DATA:  Fever and abdominal pain.  Initial encounter.  EXAM: CT ABDOMEN AND PELVIS WITH CONTRAST  TECHNIQUE: Multidetector CT imaging of the abdomen and pelvis was performed using the standard protocol following bolus administration of intravenous contrast.  CONTRAST:  OMNIPAQUE IOHEXOL 300 MG/ML  SOLN  COMPARISON:  05/23/2014 CTA.  FINDINGS: Musculoskeletal: No aggressive osseous lesions. Severe degenerative disc disease. LEFT hip hemiarthroplasty. RIGHT hip osteoarthritis.  Lung Bases: Atelectasis.  Liver:  Mild within normal limits.  Spleen:  Normal.  Gallbladder: Distended. There is pericholecystic fluid in the gallbladder fossa. No calcified stones are identified.  Common bile duct: Common bile duct appears within normal limits. No calcified common duct stone identified.  Pancreas:  Atrophic.  No mass lesion or inflammatory change.  Adrenal glands:  Normal bilaterally.  Kidneys: Renal scarring is present. Artifactual decreased enhancement in the RIGHT inferior renal pole from extremities. LEFT renal scarring. Excretion of contrast appears normal bilaterally.  Stomach: Hyper trophic appearance of the stomach proximally, suggesting hypertrophic gastritis. No change from prior.  Small  bowel:  No small bowel obstruction or inflammatory change.  Colon: Large stool burden. Colonic diverticulosis. No diverticulitis.  Pelvic Genitourinary: Thickening of the urinary bladder with cellules along the LEFT lateral wall. Bladder is partially obscured by artifact from LEFT hip arthroplasty.  Peritoneum: No free air.  Vascular/lymphatic: Aortoiliac aneurysms bilaterally appear similar to the prior exam. No rupture or acute vascular abnormality. No retroperitoneal hematoma/ hemorrhage. No extravasation of contrast.  Body Wall: Fat containing subxiphoid hernia.  IMPRESSION: 1. Pericholecystic fluid is new since the prior exam and is potentially associated with acute cholecystitis. Correlate with laboratory parameters and consider a RIGHT upper quadrant ultrasound if warranted. 2. Unchanged iliofemoral aneurysms. 3. Bladder wall thickening and cellules suggesting chronic bladder outflow obstruction. Recommend correlation with urinalysis for cystitis.   Electronically Signed   By: Andreas Newport M.D.   On: 11/14/2014 19:32    Scheduled Meds: . amLODipine  5 mg Oral Q1500  . aspirin EC  81 mg Oral Daily  . diazepam  5 mg Oral Q1500  . folic acid  3 mg Oral Daily  . heparin  5,000 Units Subcutaneous 3 times per day  . levothyroxine  50 mcg Oral QAC breakfast  . metoprolol tartrate  25 mg Oral Daily  . pantoprazole  40 mg Oral Daily  . piperacillin-tazobactam (ZOSYN)  IV  3.375 g Intravenous 3 times per day  . predniSONE  5 mg Oral QODAY   Continuous Infusions:    Time Spent: 35 min   Marinda Elk  [facility] Pager [phone]. If 7PM-7AM, please contact night-coverage at [email or web address], password [facility] 11/16/2014, 7:24 AM  LOS: 2 days

## 2014-11-16 NOTE — Progress Notes (Signed)
Subjective: Intermittent fevers. HIDA positive for cholecystitis. Denies n/v/abd pain. Hungry.   Objective: Vital signs in last 24 hours: Temp:  [97.5 F (36.4 C)-101.1 F (38.4 C)] 101.1 F (38.4 C) (08/21 0335) Pulse Rate:  [60-69] 68 (08/21 0335) Resp:  [16-18] 18 (08/21 0335) BP: (119-146)/(47-52) 119/47 mmHg (08/21 0335) SpO2:  [92 %-97 %] 93 % (08/21 0335) Weight:  [71.2 kg (156 lb 15.5 oz)] 71.2 kg (156 lb 15.5 oz) (08/21 0404)    Intake/Output from previous day: 08/20 0701 - 08/21 0700 In: 3025 [I.V.:2875; IV Piggyback:150] Out: 300 [Urine:300] Intake/Output this shift:    Asleep, easily awakes. Nontoxic. Hard of hearing Soft, nt, nd,  No edema   Lab Results:   Recent Labs  11/14/14 1730 11/15/14 0329  WBC 10.5 10.6*  HGB 10.2* 10.4*  HCT 31.0* 31.8*  PLT 54* 101*   BMET  Recent Labs  11/14/14 1730 11/15/14 0624  NA 130* 131*  K 3.6 3.9  CL 98* 100*  CO2 21* 22  GLUCOSE 99 135*  BUN 19 14  CREATININE 1.13 1.10  CALCIUM 8.4* 8.0*   Hepatic Function Latest Ref Rng 11/15/2014 11/14/2014 05/25/2014  Total Protein 6.5 - 8.1 g/dL 5.8(L) 5.7(L) 5.5(L)  Albumin 3.5 - 5.0 g/dL 3.0(L) 3.3(L) 2.9(L)  AST 15 - 41 U/L 281(H) 412(H) 20  ALT 17 - 63 U/L 182(H) 164(H) 9  Alk Phosphatase 38 - 126 U/L 385(H) 410(H) 48  Total Bilirubin 0.3 - 1.2 mg/dL 2.7(H) 3.0(H) 0.8     PT/INR No results for input(s): LABPROT, INR in the last 72 hours. ABG No results for input(s): PHART, HCO3 in the last 72 hours.  Invalid input(s): PCO2, PO2  Studies/Results: Dg Chest 2 View  11/14/2014   CLINICAL DATA:  Shortness of breath and low grade fever this evening.  EXAM: CHEST  2 VIEW  COMPARISON:  February 19, 2014  FINDINGS: The heart size and mediastinal contours are stable. Heart size is enlarged. Patient is status post prior median sternotomy, CABG, transcatheter aortic valve replacement. There is no focal infiltrate, pulmonary edema, or pleural effusion. The  visualized skeletal structures are stable.  IMPRESSION: No active cardiopulmonary disease.   Electronically Signed   By: Abelardo Diesel M.D.   On: 11/14/2014 19:14   Nm Hepatobiliary Liver Func  11/15/2014   CLINICAL DATA:  Cholecystitis on prior CT.  EXAM: NUCLEAR MEDICINE HEPATOBILIARY IMAGING  TECHNIQUE: Sequential images of the abdomen were obtained out to 60 minutes following intravenous administration of radiopharmaceutical.  RADIOPHARMACEUTICALS:  5.15 MCi Tc-57m  Choletec IV  COMPARISON:  CT 11/14/2014.  FINDINGS: There is prompt hepatic uptake of radiotracer. Excretion of contrast into the common bile duct is visible as expected at between 10 and 15 minutes. Normal spill of radiotracer into the duodenum.  There was nonvisualization of the gallbladder after 1 hour. The study was continued with administration of IV morphine. Continued imaging failed to demonstrate the gallbladder. The findings are compatible with acute cholecystitis without common bile duct obstruction.  Liver washout is relatively poor, suggesting an element of hepatic failure.  IMPRESSION: Nonvisualization of the gallbladder compatible with acute cholecystitis. Poor washout of radiotracer in the liver suggests an element of hepatic dysfunction or partial obstruction of the common bile duct.   Electronically Signed   By: Dereck Ligas M.D.   On: 11/15/2014 12:30   Ct Abdomen Pelvis W Contrast  11/14/2014   CLINICAL DATA:  Fever and abdominal pain.  Initial encounter.  EXAM: CT ABDOMEN  AND PELVIS WITH CONTRAST  TECHNIQUE: Multidetector CT imaging of the abdomen and pelvis was performed using the standard protocol following bolus administration of intravenous contrast.  CONTRAST:  195m OMNIPAQUE IOHEXOL 300 MG/ML  SOLN  COMPARISON:  05/23/2014 CTA.  FINDINGS: Musculoskeletal: No aggressive osseous lesions. Severe degenerative disc disease. LEFT hip hemiarthroplasty. RIGHT hip osteoarthritis.  Lung Bases: Atelectasis.  Liver:  Mild  within normal limits.  Spleen:  Normal.  Gallbladder: Distended. There is pericholecystic fluid in the gallbladder fossa. No calcified stones are identified.  Common bile duct: Common bile duct appears within normal limits. No calcified common duct stone identified.  Pancreas:  Atrophic.  No mass lesion or inflammatory change.  Adrenal glands:  Normal bilaterally.  Kidneys: Renal scarring is present. Artifactual decreased enhancement in the RIGHT inferior renal pole from extremities. LEFT renal scarring. Excretion of contrast appears normal bilaterally.  Stomach: Hyper trophic appearance of the stomach proximally, suggesting hypertrophic gastritis. No change from prior.  Small bowel:  No small bowel obstruction or inflammatory change.  Colon: Large stool burden. Colonic diverticulosis. No diverticulitis.  Pelvic Genitourinary: Thickening of the urinary bladder with cellules along the LEFT lateral wall. Bladder is partially obscured by artifact from LEFT hip arthroplasty.  Peritoneum: No free air.  Vascular/lymphatic: Aortoiliac aneurysms bilaterally appear similar to the prior exam. No rupture or acute vascular abnormality. No retroperitoneal hematoma/ hemorrhage. No extravasation of contrast.  Body Wall: Fat containing subxiphoid hernia.  IMPRESSION: 1. Pericholecystic fluid is new since the prior exam and is potentially associated with acute cholecystitis. Correlate with laboratory parameters and consider a RIGHT upper quadrant ultrasound if warranted. 2. Unchanged iliofemoral aneurysms. 3. Bladder wall thickening and cellules suggesting chronic bladder outflow obstruction. Recommend correlation with urinalysis for cystitis.   Electronically Signed   By: GDereck LigasM.D.   On: 11/14/2014 19:32    Anti-infectives: Anti-infectives    Start     Dose/Rate Route Frequency Ordered Stop   11/15/14 0200  piperacillin-tazobactam (ZOSYN) IVPB 3.375 g     3.375 g 12.5 mL/hr over 240 Minutes Intravenous 3 times  per day 11/15/14 0015     11/14/14 1815  piperacillin-tazobactam (ZOSYN) IVPB 3.375 g     3.375 g 100 mL/hr over 30 Minutes Intravenous  Once 11/14/14 1809 11/14/14 1921      Assessment/Plan: Sepsis Elevated LFTs Acute acalculous cholecystitis Elevated troponin - NSTEMI UTI Anemia of chronic disease CAD H/o HTN H/o diastolic heart failure  HIDA positive for cholecystitis. No stones on CT.  Unclear if LFT elevation is from cholecystitis, CBD stone, or 'shock liver' Given constellation of medical problems, would rec cholecystostomy tube placement over cholecystectomy right now. Discussed with pt.  Will need to monitor LFT trend.  NPO for drain placment Hold subcu heparin for IR procedure Can have clears after procedure and resume subcu heparin OOB, PT/OT  ELeighton Ruff WRedmond Pulling MD, FACS General, Bariatric, & Minimally Invasive Surgery [facility] [place]  LOS: 2 days    WGayland Curry8/21/2016

## 2014-11-16 NOTE — H&P (Signed)
Chief Complaint: Patient was seen in consultation today for acute cholecystitis  Chief Complaint  Patient presents with  . Fever   at the request of Dr. Andrey Campanile [facility]  Referring Physician(s): Dr. Andrey Campanile  History of Present Illness: Hunter Choi is a 81 y.o. male who presented on 8/19 with c/o abdominal pain, nausea, vomiting and fevers. Imaging and labs reveal acute cholecystitis, [facility]CS has seen the patient and requested percutaneous cholecystostomy tube. The patient denies any current abdominal pain or nausea. He denies any chest pain, shortness of breath or palpitations. He denies any active signs of bleeding or excessive bruising. The patient denies any history of sleep apnea or chronic oxygen use. He has previously tolerated sedation without complications.    Past Medical History  Diagnosis Date  . Hypertension   . GERD (gastroesophageal reflux disease)   . Thyroid disease     HYPOTHYROIDISM  . Aortic stenosis, severe   . Embolism involving retinal artery   . BPH (benign prostatic hypertrophy)     Dr. Retta Diones  . Iliac artery aneurysm     Dr. Edilia Bo  . AAA (abdominal aortic aneurysm)     Dr. Ashley Royalty  . Diastolic heart failure   . 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 Coronary artery disease     Left main disease and severe 3-vessel CAD  . CAD (coronary artery disease) of bypass graft     Chronic occlusion of saphenous vein graft to RCA  . Hypothyroidism   . Shortness of breath   . Heart murmur   . Stroke     mini stroke effective his eye left  . Arthritis   . 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 Atrial fibrillation     Past Surgical History  Procedure Laterality Date  . Left hip hemiarthropasty      AFTER HIP FX  . Coronary artery bypass graft     1999..Dr. Sheliah Plane  . Total knee arthroplasty      right 1992   left  2002  . Biopsy prostate      2005  . Prostate surgery  2012    TURP  . Joint replacement  2007    THR  . Harvest bone graft  1982    FOR THE  ANKLE  . Skin grafts      1968  . Colonoscopy    . Toe surgery Bilateral   . Transcatheter aortic valve replacement, transapical N/A 01/15/2013    Procedure: TRANSCATHETER AORTIC VALVE REPLACEMENT, TRANSAPICAL;  Surgeon: [name], MD;  Location: [facility] OR;  Service: Open Heart Surgery;  Laterality: N/A;  . Intraoperative transesophageal echocardiogram N/A 01/15/2013    Procedure: INTRAOPERATIVE TRANSESOPHAGEAL ECHOCARDIOGRAM;  Surgeon: [name], MD;  Location: [facility] OR;  Service: Open Heart Surgery;  Laterality: N/A;  . Left and right heart catheterization with coronary angiogram N/A 12/20/2012    Procedure: LEFT AND RIGHT HEART CATHETERIZATION WITH CORONARY ANGIOGRAM;  Surgeon: Lesleigh Noe, MD;  Location: [facility] CATH LAB;  Service: Cardiovascular;  Laterality: N/A;  . Carotid endarterectomy Right Jan. 2, 1997    CE    Allergies: Ciprofloxacin  Medications: Prior to Admission medications   Medication Sig Start Date End Date Taking? Authorizing  Provider  amLODipine (NORVASC) 5 MG tablet Take 5 mg by mouth daily at 3 pm. 11/11/14  Yes Historical Provider, MD  aspirin EC 81 MG EC tablet Take 1 tablet (81 mg total) by mouth daily. 01/21/13  Yes Donielle Margaretann Loveless, PA-C  diazepam (VALIUM) 5 MG tablet Take 5 mg by mouth daily at 3 pm.    Yes Historical Provider, MD  folic acid (FOLVITE) 1 MG tablet Take 3 mg by mouth daily.   Yes Historical Provider, MD  levothyroxine (SYNTHROID, LEVOTHROID) 50 MCG tablet Take 50 mcg by mouth daily. 11/06/14  Yes Historical Provider, MD  methotrexate 2.5 MG tablet Take 25 mg by mouth once a week. Every Thursday   Yes Historical Provider, MD  metoprolol tartrate (LOPRESSOR) 25 MG tablet Take 1 tablet (25 mg total) by mouth daily.  01/21/13  Yes Donielle Margaretann Loveless, PA-C  omeprazole (PRILOSEC) 20 MG capsule Take 20 mg by mouth daily.   Yes Historical Provider, MD  predniSONE (DELTASONE) 5 MG tablet Take 5 mg by mouth every other day.    Yes Historical Provider, MD     Family History  Problem Relation Age of Onset  . Heart attack Mother   . Heart disease Mother     Before age 45  . Hyperlipidemia Mother   . Hypertension Mother   . Hypertension Son   . Heart disease Father     AAA-   . Hyperlipidemia Father   . Heart attack Father   . Hypertension Father   . Heart disease Brother     Before age 40  . Hyperlipidemia Brother   . Heart attack Brother   . Hypertension Brother     Social History   Social History  . Marital Status: Married    Spouse Name: N/A  . Number of Children: N/A  . Years of Education: N/A   Social History Main Topics  . Smoking status: Former Smoker -- 0.50 packs/day for 47 years    Types: Cigarettes    Quit date: 03/28/1958  . Smokeless tobacco: Former Neurosurgeon    Types: Chew     Comment: ONLY USED 2 YEARS  . Alcohol Use: No  . Drug Use: No  . Sexual Activity: Not Asked   Other Topics Concern  . None   Social History Narrative   Review of Systems: A 12 point ROS discussed and pertinent positives are indicated in the HPI above.  All other systems are negative.  Review of Systems  Vital Signs: BP 125/52 mmHg  Pulse 71  Temp(Src) 100 F (37.8 C) (Oral)  Resp 15  Ht 5\' 7"  (1.702 m)  Wt 156 lb 15.5 oz (71.2 kg)  BMI 24.58 kg/m2  SpO2 98%  Physical Exam  Constitutional: He is oriented to person, place, and time. No distress.  HENT:  Head: Normocephalic and atraumatic.  Neck: No tracheal deviation present.  Cardiovascular: Normal rate and regular rhythm.  Exam reveals no gallop and no friction rub.   No murmur heard. Pulmonary/Chest: Effort normal and breath sounds normal. No respiratory distress. He has no wheezes. He has no rales.  Abdominal: Soft. Bowel sounds  are normal. He exhibits no distension. There is no tenderness.  Neurological: He is alert and oriented to person, place, and time.  Skin: He is not diaphoretic.  Psychiatric: He has a normal mood and affect. His behavior is normal. Thought content normal.    Mallampati Score:  MD Evaluation Airway: WNL Heart: WNL Abdomen: WNL Chest/  Lungs: WNL ASA  Classification: 3 Mallampati/Airway Score: Two  Imaging: Dg Chest 2 View  11/14/2014   CLINICAL DATA:  Shortness of breath and low grade fever this evening.  EXAM: CHEST  2 VIEW  COMPARISON:  February 19, 2014  FINDINGS: The heart size and mediastinal contours are stable. Heart size is enlarged. Patient is status post prior median sternotomy, CABG, transcatheter aortic valve replacement. There is no focal infiltrate, pulmonary edema, or pleural effusion. The visualized skeletal structures are stable.  IMPRESSION: No active cardiopulmonary disease.   Electronically Signed   By: Sherian Rein M.D.   On: 11/14/2014 19:14   Nm Hepatobiliary Liver Func  11/15/2014   CLINICAL DATA:  Cholecystitis on prior CT.  EXAM: NUCLEAR MEDICINE HEPATOBILIARY IMAGING  TECHNIQUE: Sequential images of the abdomen were obtained out to 60 minutes following intravenous administration of radiopharmaceutical.  RADIOPHARMACEUTICALS:  5.15 MCi Tc-65m  Choletec IV  COMPARISON:  CT 11/14/2014.  FINDINGS: There is prompt hepatic uptake of radiotracer. Excretion of contrast into the common bile duct is visible as expected at between 10 and 15 minutes. Normal spill of radiotracer into the duodenum.  There was nonvisualization of the gallbladder after 1 hour. The study was continued with administration of IV morphine. Continued imaging failed to demonstrate the gallbladder. The findings are compatible with acute cholecystitis without common bile duct obstruction.  Liver washout is relatively poor, suggesting an element of hepatic failure.  IMPRESSION: Nonvisualization of the  gallbladder compatible with acute cholecystitis. Poor washout of radiotracer in the liver suggests an element of hepatic dysfunction or partial obstruction of the common bile duct.   Electronically Signed   By: Andreas Newport M.D.   On: 11/15/2014 12:30   Ct Abdomen Pelvis W Contrast  11/14/2014   CLINICAL DATA:  Fever and abdominal pain.  Initial encounter.  EXAM: CT ABDOMEN AND PELVIS WITH CONTRAST  TECHNIQUE: Multidetector CT imaging of the abdomen and pelvis was performed using the standard protocol following bolus administration of intravenous contrast.  CONTRAST:  OMNIPAQUE IOHEXOL 300 MG/ML  SOLN  COMPARISON:  05/23/2014 CTA.  FINDINGS: Musculoskeletal: No aggressive osseous lesions. Severe degenerative disc disease. LEFT hip hemiarthroplasty. RIGHT hip osteoarthritis.  Lung Bases: Atelectasis.  Liver:  Mild within normal limits.  Spleen:  Normal.  Gallbladder: Distended. There is pericholecystic fluid in the gallbladder fossa. No calcified stones are identified.  Common bile duct: Common bile duct appears within normal limits. No calcified common duct stone identified.  Pancreas:  Atrophic.  No mass lesion or inflammatory change.  Adrenal glands:  Normal bilaterally.  Kidneys: Renal scarring is present. Artifactual decreased enhancement in the RIGHT inferior renal pole from extremities. LEFT renal scarring. Excretion of contrast appears normal bilaterally.  Stomach: Hyper trophic appearance of the stomach proximally, suggesting hypertrophic gastritis. No change from prior.  Small bowel:  No small bowel obstruction or inflammatory change.  Colon: Large stool burden. Colonic diverticulosis. No diverticulitis.  Pelvic Genitourinary: Thickening of the urinary bladder with cellules along the LEFT lateral wall. Bladder is partially obscured by artifact from LEFT hip arthroplasty.  Peritoneum: No free air.  Vascular/lymphatic: Aortoiliac aneurysms bilaterally appear similar to the prior exam. No rupture  or acute vascular abnormality. No retroperitoneal hematoma/ hemorrhage. No extravasation of contrast.  Body Wall: Fat containing subxiphoid hernia.  IMPRESSION: 1. Pericholecystic fluid is new since the prior exam and is potentially associated with acute cholecystitis. Correlate with laboratory parameters and consider a RIGHT upper quadrant ultrasound if warranted. 2. Unchanged  iliofemoral aneurysms. 3. Bladder wall thickening and cellules suggesting chronic bladder outflow obstruction. Recommend correlation with urinalysis for cystitis.   Electronically Signed   By: Andreas Newport M.D.   On: 11/14/2014 19:32    Labs:  CBC:  Recent Labs  05/24/14 0614 05/25/14 0515 11/14/14 1730 11/15/14 0329  WBC 7.1 7.4 10.5 10.6*  HGB 10.3* 10.6* 10.2* 10.4*  HCT 32.0* 32.2* 31.0* 31.8*  PLT 109* 113* 54* 101*    COAGS: No results for input(s): INR, APTT in the last 8760 hours.  BMP:  Recent Labs  05/24/14 0614 05/25/14 0515 11/14/14 1730 11/15/14 0624  NA 132* 131* 130* 131*  K 3.5 3.6 3.6 3.9  CL 102 99 98* 100*  CO2 24 23 21* 22  GLUCOSE 89 104* 99 135*  BUN 24* 13 19 14   CALCIUM 7.8* 8.0* 8.4* 8.0*  CREATININE 0.92 0.72 1.13 1.10  GFRNONAA 75* 83* 57* 59*  GFRAA 87* >90 >60 >60    LIVER FUNCTION TESTS:  Recent Labs  05/23/14 1729 05/25/14 0515 11/14/14 1730 11/15/14 0624  BILITOT 0.8 0.8 3.0* 2.7*  AST 25 20 412* 281*  ALT 11 9 164* 182*  ALKPHOS 64 48 410* 385*  PROT 7.2 5.5* 5.7* 5.8*  ALBUMIN 4.0 2.9* 3.3* 3.0*    Assessment and Plan: Nausea/vomiting Fevers RUQ abdominal pain-denies currently Imaging revealed acute cholecystitis s/p HIDA, leukocytosis and fevers CCS has seen and requested IR to place percutaneous cholecystostomy tube  The patient will be NPO, sq heparin given this morning-will hold, labs and vitals have been reviewed. Risks and Benefits discussed with the patient including, but not limited to bleeding, infection, gallbladder perforation,  bile leak, sepsis or even death. All of the patient's questions were answered, patient is agreeable to proceed. Consent signed and in chart. CAD s/p CABG-elevated troponin /NSTEMI in setting of infection, cardiology has seen-denies CP PAfib AS s/p TAVR AAA Combined systolic and diastolic CHF EF 95-28%, echo 12/2013   Thank you for this interesting consult.  I greatly enjoyed meeting Hunter Choi and look forward to participating in their care.  A copy of this report was sent to the requesting provider on this date.  SignedBerneta Levins 11/16/2014, 10:57 AM   I spent a total of 20 Minutes in face to face in clinical consultation, greater than 50% of which was counseling/coordinating care for acute cholecystitis

## 2014-11-16 NOTE — Progress Notes (Signed)
Utilization Review Completed.Calandra Madura T8/21/2016  

## 2014-11-16 NOTE — Progress Notes (Signed)
11/16/14 Call placed to [id number] for report on patient. Dx of Sepsis due to UTI, alert and oriented, very HOH, last BM 11/16/14, IV sites 8/19 Left AC NSL, 11/14/14 Right hand with 1/2 NS with 50 meq of Sodium Bicarb at 50 ml/HR, diet currently clear liquid till midnight then NPO, full code, mod fall risk.

## 2014-11-17 ENCOUNTER — Inpatient Hospital Stay (HOSPITAL_COMMUNITY): Payer: Medicare Other

## 2014-11-17 LAB — BASIC METABOLIC PANEL
ANION GAP: 9 (ref 5–15)
BUN: 12 mg/dL (ref 6–20)
CHLORIDE: 94 mmol/L — AB (ref 101–111)
CO2: 22 mmol/L (ref 22–32)
Calcium: 7.7 mg/dL — ABNORMAL LOW (ref 8.9–10.3)
Creatinine, Ser: 1.02 mg/dL (ref 0.61–1.24)
GFR calc Af Amer: >60 mL/min (ref >60)
Glucose, Bld: 92 mg/dL (ref 65–99)
POTASSIUM: 4 mmol/L (ref 3.5–5.1)
SODIUM: 125 mmol/L — AB (ref 135–145)

## 2014-11-17 LAB — URINE CULTURE

## 2014-11-17 LAB — HEPATIC FUNCTION PANEL
ALBUMIN: 2.9 g/dL — AB (ref 3.5–5.0)
ALK PHOS: 291 U/L — AB (ref 38–126)
ALT: 80 U/L — AB (ref 17–63)
AST: 70 U/L — AB (ref 15–41)
BILIRUBIN INDIRECT: 0.7 mg/dL (ref 0.3–0.9)
Bilirubin, Direct: 0.5 mg/dL (ref 0.1–0.5)
TOTAL PROTEIN: 5.8 g/dL — AB (ref 6.5–8.1)
Total Bilirubin: 1.2 mg/dL (ref 0.3–1.2)

## 2014-11-17 LAB — GRAM STAIN: Gram Stain: NONE SEEN

## 2014-11-17 MED ORDER — MIDAZOLAM HCL 2 MG/2ML IJ SOLN
INTRAMUSCULAR | Status: AC
Start: 2014-11-17 — End: 2014-11-17
  Filled 2014-11-17: qty 2

## 2014-11-17 MED ORDER — IOHEXOL 300 MG/ML  SOLN
50.0000 mL | Freq: Once | INTRAMUSCULAR | Status: DC | PRN
  Administered 2014-11-17: 10 mL
  Filled 2014-11-17: qty 50

## 2014-11-17 MED ORDER — MIDAZOLAM HCL 2 MG/2ML IJ SOLN
INTRAMUSCULAR | Status: AC | PRN
  Administered 2014-11-17: 1 mg via INTRAVENOUS

## 2014-11-17 MED ORDER — LIDOCAINE HCL 1 % IJ SOLN
INTRAMUSCULAR | Status: AC
  Filled 2014-11-17: qty 20

## 2014-11-17 MED ORDER — METOPROLOL TARTRATE 12.5 MG HALF TABLET
12.5000 mg | ORAL_TABLET | Freq: Every day | ORAL | Status: DC
  Filled 2014-11-17: qty 1

## 2014-11-17 MED ORDER — SODIUM CHLORIDE 0.9 % IV SOLN
INTRAVENOUS | Status: AC
  Administered 2014-11-17 (×2): via INTRAVENOUS

## 2014-11-17 MED ORDER — FENTANYL CITRATE (PF) 100 MCG/2ML IJ SOLN
INTRAMUSCULAR | Status: AC
  Filled 2014-11-17: qty 2

## 2014-11-17 MED ORDER — FENTANYL CITRATE (PF) 100 MCG/2ML IJ SOLN
INTRAMUSCULAR | Status: AC | PRN
  Administered 2014-11-17: 50 ug via INTRAVENOUS

## 2014-11-17 MED ORDER — HYDROCODONE-ACETAMINOPHEN 5-325 MG PO TABS: 1.0000 | ORAL_TABLET | ORAL | Status: DC | PRN

## 2014-11-17 MED ORDER — AMOXICILLIN-POT CLAVULANATE 875-125 MG PO TABS
1.0000 | ORAL_TABLET | Freq: Two times a day (BID) | ORAL | Status: DC
  Administered 2014-11-17 – 2014-11-18 (×3): 1 via ORAL
  Filled 2014-11-17 (×3): qty 1

## 2014-11-17 NOTE — Procedures (Signed)
34f gallbladder drain placed  No complication No blood loss. See complete dictation in [facility].

## 2014-11-17 NOTE — Care Management Note (Addendum)
Case Management Note  Patient Details  Name: Hunter Choi MRN: [id number] Date of Birth: 1927/09/15  Subjective/Objective:  Patient lives with wife, await pt eval.  Patient states his wife was here in the hospital on Friday and left and now he is here.  NCM will cont to follow for dc needs.  Per pt eval rec snf, CSW referral.                 Action/Plan:   Expected Discharge Date:                  Expected Discharge Plan:  Home w Home Health Services  In-House Referral:     Discharge planning Services  CM Consult  Post Acute Care Choice:    Choice offered to:     DME Arranged:    DME Agency:     HH Arranged:    HH Agency:     Status of Service:  In process, will continue to follow  Medicare Important Message Given:  Yes-second notification given Date Medicare IM Given:    Medicare IM give by:    Date Additional Medicare IM Given:    Additional Medicare Important Message give by:     If discussed at Long Length of Stay Meetings, dates discussed:    Additional Comments:  [name], RN 11/17/2014, 10:51 AM

## 2014-11-17 NOTE — Progress Notes (Signed)
[facility] PROGRESS NOTE   Assessment/Plan: Sepsis due to acute Cholecystitis - Started on empiric antibiotics Zosyn on admission, percutaneous tube placed, he is defervescing. - Bile fluid cultures sent, likely low probability showing a species as he had spent a few days on IV antibiotics in house. - I will de-escalate his antibiotics to Unasyn. - Status post biliary peritendinous drainage placed on 11/17/2014.  - Discontinue half-normal saline, start NS infusion. -PT consult.  Elevated troponin I level: - In the setting of sepsis likely demand ischemia and a 81 year old. Has remained chest pain-free. - Cardiac biomarkers are trending down.  Elevated LFTs - Likely due to cholecystitis, recheck LFTs.   Thrombocytopenia: Likely due to sepsis, is improving now.  Normocytic anemia: - Will follow up with PCP as an outpatient. - HBG stable.  Code Status: full Family Communication: none  Disposition Plan: home in 1-2 days   Consultants:  Surgery  cardiology  Procedures:  CT abd and pelvis  HIDA scan pending  Antibiotics:  Zosyn  HPI/Subjective: Sleepy, but able to answer questions. He relates he is hungry and would like something to eat.  Objective: Filed Vitals:   11/17/14 0806 11/17/14 0846 11/17/14 0851 11/17/14 0853  BP: 150/67 165/67 166/54 166/65  Pulse: 75 76 52 78  Temp:      TempSrc:      Resp: 15 20 20 15   Height:      Weight:      SpO2: 95% 96% 95% 97%    Intake/Output Summary (Last 24 hours) at 11/17/14 1038 Last data filed at 11/17/14 1000  Gross per 24 hour  Intake 1718.33 ml  Output      0 ml  Net 1718.33 ml   Filed Weights   11/16/14 0404 11/16/14 1737 11/17/14 0500  Weight: 71.2 kg (156 lb 15.5 oz) 72.2 kg (159 lb 2.8 oz) 71.05 kg (156 lb 10.2 oz)    Exam:  General: Alert, awake, oriented x3, in no acute distress.  HEENT: No bruits, no goiter.  Heart: Regular rate and rhythm. Lungs: Good air movement, bilateral  air movement.  Abdomen: Soft, nontender, no upper quadrant tenderness, no suprapubic tenderness, nondistended, positive bowel sounds.  Neuro: Grossly intact, nonfocal.   Data Reviewed: Basic Metabolic Panel:  Recent Labs Lab 11/14/14 1730 11/15/14 0329 11/15/14 0624 11/16/14 1045 11/17/14 0503  NA 130*  --  131* 128* 125*  K 3.6  --  3.9 3.8 4.0  CL 98*  --  100* 100* 94*  CO2 21*  --  22 19* 22  GLUCOSE 99  --  135* 172* 92  BUN 19  --  14 15 12   CREATININE 1.13  --  1.10 1.21 1.02  CALCIUM 8.4*  --  8.0* 7.9* 7.7*  MG  --  1.6*  --  2.1  --   PHOS  --  3.2  --   --   --    Liver Function Tests:  Recent Labs Lab 11/14/14 1730 11/15/14 0624  AST 412* 281*  ALT 164* 182*  ALKPHOS 410* 385*  BILITOT 3.0* 2.7*  PROT 5.7* 5.8*  ALBUMIN 3.3* 3.0*    Recent Labs Lab 11/14/14 1730  LIPASE 43   No results for input(s): AMMONIA in the last 168 hours. CBC:  Recent Labs Lab 11/14/14 1730 11/15/14 0329  WBC 10.5 10.6*  NEUTROABS 9.1*  --   HGB 10.2* 10.4*  HCT 31.0* 31.8*  MCV 89.6 89.1  PLT 54* 101*   Cardiac  Enzymes:  Recent Labs Lab 11/15/14 0329 11/15/14 0624 11/15/14 1235 11/15/14 1835 11/15/14 2320  TROPONINI 0.62* 0.83* 1.36* 0.68* 0.59*   BNP (last 3 results) No results for input(s): BNP in the last 8760 hours.  ProBNP (last 3 results) No results for input(s): PROBNP in the last 8760 hours.  CBG:  Recent Labs Lab 11/15/14 1101  GLUCAP 110*    Recent Results (from the past 240 hour(s))  Blood Culture (routine x 2)     Status: None (Preliminary result)   Collection Time: 11/14/14  5:30 PM  Result Value Ref Range Status   Specimen Description BLOOD LEFT HAND  Final   Special Requests BOTTLES DRAWN AEROBIC AND ANAEROBIC 5CC  Final   Culture NO GROWTH 2 DAYS  Final   Report Status PENDING  Incomplete  Blood Culture (routine x 2)     Status: None (Preliminary result)   Collection Time: 11/14/14  5:33 PM  Result Value Ref Range Status    Specimen Description BLOOD LEFT FOREARM  Final   Special Requests BOTTLES DRAWN AEROBIC AND ANAEROBIC 5CC  Final   Culture NO GROWTH 2 DAYS  Final   Report Status PENDING  Incomplete  Urine culture     Status: None   Collection Time: 11/14/14  6:49 PM  Result Value Ref Range Status   Specimen Description URINE, RANDOM  Final   Special Requests NONE  Final   Culture >=100,000 COLONIES/mL ENTEROCOCCUS SPECIES  Final   Report Status 11/17/2014 FINAL  Final   Organism ID, Bacteria ENTEROCOCCUS SPECIES  Final      Susceptibility   Enterococcus species - MIC*    AMPICILLIN <=2 SENSITIVE Sensitive     LEVOFLOXACIN 1 SENSITIVE Sensitive     NITROFURANTOIN <=16 SENSITIVE Sensitive     VANCOMYCIN 1 SENSITIVE Sensitive     * >=100,000 COLONIES/mL ENTEROCOCCUS SPECIES  MRSA PCR Screening     Status: None   Collection Time: 11/15/14  1:45 AM  Result Value Ref Range Status   MRSA by PCR NEGATIVE NEGATIVE Final    Comment:        The GeneXpert MRSA Assay (FDA approved for NASAL specimens only), is one component of a comprehensive MRSA colonization surveillance program. It is not intended to diagnose MRSA infection nor to guide or monitor treatment for MRSA infections.      Studies: Nm Hepatobiliary Liver Func  11/15/2014   CLINICAL DATA:  Cholecystitis on prior CT.  EXAM: NUCLEAR MEDICINE HEPATOBILIARY IMAGING  TECHNIQUE: Sequential images of the abdomen were obtained out to 60 minutes following intravenous administration of radiopharmaceutical.  RADIOPHARMACEUTICALS:  5.15 MCi Tc-76m  Choletec IV  COMPARISON:  CT 11/14/2014.  FINDINGS: There is prompt hepatic uptake of radiotracer. Excretion of contrast into the common bile duct is visible as expected at between 10 and 15 minutes. Normal spill of radiotracer into the duodenum.  There was nonvisualization of the gallbladder after 1 hour. The study was continued with administration of IV morphine. Continued imaging failed to demonstrate the  gallbladder. The findings are compatible with acute cholecystitis without common bile duct obstruction.  Liver washout is relatively poor, suggesting an element of hepatic failure.  IMPRESSION: Nonvisualization of the gallbladder compatible with acute cholecystitis. Poor washout of radiotracer in the liver suggests an element of hepatic dysfunction or partial obstruction of the common bile duct.   Electronically Signed   By: Andreas Newport M.D.   On: 11/15/2014 12:30   Ir Perc Cholecystostomy  11/17/2014  CLINICAL DATA:  Acute cholecystitis  EXAM: PERCUTANEOUS CHOLECYSTOSTOMY TUBE PLACEMENT WITH ULTRASOUND AND FLUOROSCOPIC GUIDANCE:  FLUOROSCOPY TIME:  36 seconds, 30 mGy  TECHNIQUE: The procedure, risks (including but not limited to bleeding, infection, organ damage ), benefits, and alternatives were explained to the patient. Questions regarding the procedure were encouraged and answered. The patient understands and consents to the procedure. Survey ultrasound of the abdomen was performed and an appropriate skin entry site was identified. Skin site was marked, prepped with Betadine, and draped in usual sterile fashion, and infiltrated locally with 1% lidocaine.  Intravenous Fentanyl and Versed were administered as conscious sedation during continuous cardiorespiratory monitoring by the radiology RN, with a total moderate sedation time of 10 minutes.  Under real-time ultrasound guidance, gallbladder was accessed using a transhepatic approach with a 21-gauge needle. Ultrasound image documentation was saved. Bile returned through the hub. Needle was exchanged over a 018 guidewire for transitional dilator which allowed placement of 035 J wire. Over this, a 10.2 French pigtail catheter was advanced and formed centrally in the gallbladder lumen. Small contrast injection confirmed appropriate position. Catheter secured externally with 0 Prolene suture and placed external drain bag. Patient tolerated the procedure  well.  COMPLICATIONS: COMPLICATIONS none  IMPRESSION: 1. Technically successful percutaneous cholecystostomy tube placement with ultrasound and fluoroscopic guidance.   Electronically Signed   By: Corlis Leak M.D.   On: 11/17/2014 09:10    Scheduled Meds: . amLODipine  5 mg Oral Q1500  . aspirin EC  81 mg Oral Daily  . diazepam  5 mg Oral Q1500  . fentaNYL      . folic acid  3 mg Oral Daily  . heparin  5,000 Units Subcutaneous 3 times per day  . levothyroxine  50 mcg Oral QAC breakfast  . lidocaine      . metoprolol tartrate  25 mg Oral Daily  . midazolam      . pantoprazole  40 mg Oral Daily  . piperacillin-tazobactam (ZOSYN)  IV  3.375 g Intravenous 3 times per day  . predniSONE  5 mg Oral QODAY   Continuous Infusions: . sodium chloride 0.45 % 1,000 mL with sodium bicarbonate 50 mEq infusion 50 mL/hr at 11/16/14 1614    Time Spent: 35 min   Marinda Elk  [facility] Pager [phone]. If 7PM-7AM, please contact night-coverage at [email or web address], password [facility] 11/17/2014, 10:38 AM  LOS: 3 days

## 2014-11-17 NOTE — Progress Notes (Signed)
Patient ID: Hunter Choi, male   DOB: Aug 19, 1927, 81 y.o.   MRN: [id number]    Subjective: Pt just got back from perc chole drain placement.  Feels ok.  Hungry.  Objective: Vital signs in last 24 hours: Temp:  [98.6 F (37 C)-100.8 F (38.2 C)] 100.1 F (37.8 C) (08/22 0541) Pulse Rate:  [52-78] 78 (08/22 0853) Resp:  [13-20] 15 (08/22 0853) BP: (109-166)/(44-67) 166/65 mmHg (08/22 0853) SpO2:  [94 %-97 %] 97 % (08/22 0853) Weight:  [71.05 kg (156 lb 10.2 oz)-72.2 kg (159 lb 2.8 oz)] 71.05 kg (156 lb 10.2 oz) (08/22 0500) Last BM Date: 11/16/14  Intake/Output from previous day: 08/21 0701 - 08/22 0700 In: 2078.3 [P.O.:1340; I.V.:638.3; IV Piggyback:100] Out: 250 [Urine:250] Intake/Output this shift:    PE: Abd: soft, minimally tender, drain with no output currently, +BS  Lab Results:   Recent Labs  11/14/14 1730 11/15/14 0329  WBC 10.5 10.6*  HGB 10.2* 10.4*  HCT 31.0* 31.8*  PLT 54* 101*   BMET  Recent Labs  11/16/14 1045 11/17/14 0503  NA 128* 125*  K 3.8 4.0  CL 100* 94*  CO2 19* 22  GLUCOSE 172* 92  BUN 15 12  CREATININE 1.21 1.02  CALCIUM 7.9* 7.7*   PT/INR  Recent Labs  11/16/14 1045  LABPROT 16.3*  INR 1.30   CMP     Component Value Date/Time   NA 125* 11/17/2014 0503   K 4.0 11/17/2014 0503   CL 94* 11/17/2014 0503   CO2 22 11/17/2014 0503   GLUCOSE 92 11/17/2014 0503   BUN 12 11/17/2014 0503   CREATININE 1.02 11/17/2014 0503   CALCIUM 7.7* 11/17/2014 0503   PROT 5.8* 11/15/2014 0624   ALBUMIN 3.0* 11/15/2014 0624   AST 281* 11/15/2014 0624   ALT 182* 11/15/2014 0624   ALKPHOS 385* 11/15/2014 0624   BILITOT 2.7* 11/15/2014 0624   GFRNONAA >60 11/17/2014 0503   GFRAA >60 11/17/2014 0503   Lipase     Component Value Date/Time   LIPASE 43 11/14/2014 1730       Studies/Results: Nm Hepatobiliary Liver Func  11/15/2014   CLINICAL DATA:  Cholecystitis on prior CT.  EXAM: NUCLEAR MEDICINE HEPATOBILIARY IMAGING  TECHNIQUE:  Sequential images of the abdomen were obtained out to 60 minutes following intravenous administration of radiopharmaceutical.  RADIOPHARMACEUTICALS:  5.15 MCi Tc-83m  Choletec IV  COMPARISON:  CT 11/14/2014.  FINDINGS: There is prompt hepatic uptake of radiotracer. Excretion of contrast into the common bile duct is visible as expected at between 10 and 15 minutes. Normal spill of radiotracer into the duodenum.  There was nonvisualization of the gallbladder after 1 hour. The study was continued with administration of IV morphine. Continued imaging failed to demonstrate the gallbladder. The findings are compatible with acute cholecystitis without common bile duct obstruction.  Liver washout is relatively poor, suggesting an element of hepatic failure.  IMPRESSION: Nonvisualization of the gallbladder compatible with acute cholecystitis. Poor washout of radiotracer in the liver suggests an element of hepatic dysfunction or partial obstruction of the common bile duct.   Electronically Signed   By: Andreas Newport M.D.   On: 11/15/2014 12:30   Ir Perc Cholecystostomy  11/17/2014   CLINICAL DATA:  Acute cholecystitis  EXAM: PERCUTANEOUS CHOLECYSTOSTOMY TUBE PLACEMENT WITH ULTRASOUND AND FLUOROSCOPIC GUIDANCE:  FLUOROSCOPY TIME:  36 seconds, 30 mGy  TECHNIQUE: The procedure, risks (including but not limited to bleeding, infection, organ damage ), benefits, and alternatives were explained to  the patient. Questions regarding the procedure were encouraged and answered. The patient understands and consents to the procedure. Survey ultrasound of the abdomen was performed and an appropriate skin entry site was identified. Skin site was marked, prepped with Betadine, and draped in usual sterile fashion, and infiltrated locally with 1% lidocaine.  Intravenous Fentanyl and Versed were administered as conscious sedation during continuous cardiorespiratory monitoring by the radiology RN, with a total moderate sedation time of 10  minutes.  Under real-time ultrasound guidance, gallbladder was accessed using a transhepatic approach with a 21-gauge needle. Ultrasound image documentation was saved. Bile returned through the hub. Needle was exchanged over a 018 guidewire for transitional dilator which allowed placement of 035 J wire. Over this, a 10.2 French pigtail catheter was advanced and formed centrally in the gallbladder lumen. Small contrast injection confirmed appropriate position. Catheter secured externally with 0 Prolene suture and placed external drain bag. Patient tolerated the procedure well.  COMPLICATIONS: COMPLICATIONS none  IMPRESSION: 1. Technically successful percutaneous cholecystostomy tube placement with ultrasound and fluoroscopic guidance.   Electronically Signed   By: Corlis Leak M.D.   On: 11/17/2014 09:10    Anti-infectives: Anti-infectives    Start     Dose/Rate Route Frequency Ordered Stop   11/17/14 1045  amoxicillin-clavulanate (AUGMENTIN) 875-125 MG per tablet 1 tablet     1 tablet Oral Every 12 hours 11/17/14 1042     11/15/14 0200  piperacillin-tazobactam (ZOSYN) IVPB 3.375 g  Status:  Discontinued     3.375 g 12.5 mL/hr over 240 Minutes Intravenous 3 times per day 11/15/14 0015 11/17/14 1042   11/14/14 1815  piperacillin-tazobactam (ZOSYN) IVPB 3.375 g     3.375 g 100 mL/hr over 30 Minutes Intravenous  Once 11/14/14 1809 11/14/14 1921       Assessment/Plan  1. Acute cholecystitis -s/p perc chole drain today -give clear liquids, then can likely advance tomorrow to low fat diet -cont zosyn, can likely transition to augmentin at discharge.  -await cultures from perc drain, but has already had 3 days of abx.  LOS: 3 days    Royanne Warshaw E 11/17/2014, 10:42 AM Pager: [phone]

## 2014-11-17 NOTE — Sedation Documentation (Signed)
Patient is resting comfortably. 

## 2014-11-17 NOTE — Care Management Important Message (Signed)
Important Message  Patient Details  Name: TAJUAN DUFAULT MRN: [id number] Date of Birth: May 03, 1927   Medicare Important Message Given:  Yes-second notification given    [name], RN 11/17/2014, 10:50 AMImportant Message  Patient Details  Name: BRITTANY AMIRAULT MRN: [id number] Date of Birth: 19-Aug-1927   Medicare Important Message Given:  Yes-second notification given    [name], RN 11/17/2014, 10:49 AM

## 2014-11-17 NOTE — Progress Notes (Signed)
HR 32 when sleeping, increases into 60's with verbal stimuli. Notified Dr Robb Matar. Pt in no acute distress at this time.

## 2014-11-17 NOTE — Evaluation (Signed)
Physical Therapy Evaluation Patient Details Name: Hunter Choi MRN: [id number] DOB: 03/08/1928 Today's Date: 11/17/2014   History of Present Illness  Pt is a 81 y/o M admitted w/ c/o abdomina lpain, nausea, vomiting, and fever.  Pt dx w/ acute cholecystitis and pt had placement of percutaneous cholecystostomy tube.  Pt's PMH includes HTN, hypothyroidism, aortic stenosis, AAA, CABG x4, CAD, stroke, a fib.  Clinical Impression  Pt admitted with above diagnosis. Pt currently with functional limitations due to the deficits listed below (see PT Problem List). Given pt's generalized weakness and pt's current level of assist, recommending d/c to SNF prior to return home to ensure pt's safety w/ all mobility.  Pt will benefit from skilled PT to increase their independence and safety with mobility to allow discharge to the venue listed below.      Follow Up Recommendations SNF;Supervision/Assistance - 24 hour    Equipment Recommendations  None recommended by PT    Recommendations for Other Services       Precautions / Restrictions Precautions Precautions: Fall Restrictions Weight Bearing Restrictions: No      Mobility  Bed Mobility Overal bed mobility: Needs Assistance Bed Mobility: Supine to Sit     Supine to sit: Mod assist;HOB elevated     General bed mobility comments: HOB elevated and use of bed rail.  Assist provide to support trunk posteriorly.  Cues provided for technique and to scoot to EOB.  Transfers Overall transfer level: Needs assistance Equipment used: 1 person hand held assist Transfers: Squat Pivot Transfers     Squat pivot transfers: Mod assist     General transfer comment: Pt performed squat pivot to recliner chair w/ mod assist 2/2 generalized weakness.  Assist w/ management of lines and tubes.  Ambulation/Gait                Stairs            Wheelchair Mobility    Modified Rankin (Stroke Patients Only)       Balance Overall  balance assessment: Needs assistance (denies any falls in the past year) Sitting-balance support: Bilateral upper extremity supported;Feet supported Sitting balance-Leahy Scale: Good     Standing balance support: During functional activity Standing balance-Leahy Scale: Poor Standing balance comment: Requires mod assist to achieve squat pivot (no true stand during session)                             Pertinent Vitals/Pain Pain Assessment: No/denies pain    Home Living Family/patient expects to be discharged to:: Skilled nursing facility Living Arrangements: Spouse/significant other;Children (wife and two sons) Available Help at Discharge: Family;Available 24 hours/day Type of Home: House Home Access: Stairs to enter   [employer] of Steps: 3 Home Layout: One level Home Equipment: Walker - 4 wheels;Cane - single point      Prior Function Level of Independence: Independent with assistive device(s)         Comments: PTA pt used walker at all times, denies need for assist w/ ambulation and ADLs     Hand Dominance        Extremity/Trunk Assessment   Upper Extremity Assessment: Generalized weakness           Lower Extremity Assessment: Generalized weakness      Cervical / Trunk Assessment: Kyphotic  Communication   Communication: HOH  Cognition Arousal/Alertness: Lethargic Behavior During Therapy: WFL for tasks assessed/performed Overall Cognitive Status: No family/caregiver present  to determine baseline cognitive functioning                      General Comments General comments (skin integrity, edema, etc.): Given pt's generalized weakness and pt's current level of assist, recommending d/c to SNF prior to return home to ensure pt's safety w/ all mobility.    Exercises        Assessment/Plan    PT Assessment Patient needs continued PT services  PT Diagnosis Difficulty walking;Abnormality of gait;Generalized weakness;Acute  pain   PT Problem List Decreased strength;Decreased activity tolerance;Decreased balance;Decreased mobility;Decreased coordination;Decreased cognition;Decreased knowledge of use of DME;Decreased safety awareness;Decreased knowledge of precautions;Pain  PT Treatment Interventions DME instruction;Gait training;Stair training;Functional mobility training;Therapeutic activities;Therapeutic exercise;Balance training;Neuromuscular re-education;Cognitive remediation;Patient/family education   PT Goals (Current goals can be found in the Care Plan section) Acute Rehab PT Goals Patient Stated Goal: none stated PT Goal Formulation: With patient Time For Goal Achievement: 12/01/14 Potential to Achieve Goals: Good    Frequency Min 3X/week   Barriers to discharge Inaccessible home environment steps to enter home    Co-evaluation               End of Session Equipment Utilized During Treatment: Gait belt Activity Tolerance: Patient limited by fatigue Patient left: in chair;with call bell/phone within reach Nurse Communication: Mobility status;Precautions         Time: 7673-4193 PT Time Calculation (min) (ACUTE ONLY): 23 min   Charges:   PT Evaluation $Initial PT Evaluation Tier I: 1 Procedure PT Treatments $Therapeutic Activity: 8-22 mins   PT G Codes:       Michail Jewels PT, DPT [phone] Pager: [phone] 11/17/2014, 4:26 PM

## 2014-11-18 DIAGNOSIS — K819 Cholecystitis, unspecified: Secondary | ICD-10-CM

## 2014-11-18 DIAGNOSIS — IMO0001 Reserved for inherently not codable concepts without codable children: Secondary | ICD-10-CM | POA: Insufficient documentation

## 2014-11-18 LAB — CBC
HEMATOCRIT: 30.4 % — AB (ref 39.0–52.0)
Hemoglobin: 9.9 g/dL — ABNORMAL LOW (ref 13.0–17.0)
MCH: 28.5 pg (ref 26.0–34.0)
MCHC: 32.6 g/dL (ref 30.0–36.0)
MCV: 87.6 fL (ref 78.0–100.0)
PLATELETS: 127 10*3/uL — AB (ref 150–400)
RBC: 3.47 MIL/uL — ABNORMAL LOW (ref 4.22–5.81)
RDW: 17.6 % — AB (ref 11.5–15.5)
WBC: 6.3 10*3/uL (ref 4.0–10.5)

## 2014-11-18 LAB — BASIC METABOLIC PANEL
Anion gap: 7 (ref 5–15)
BUN: 6 mg/dL (ref 6–20)
CHLORIDE: 96 mmol/L — AB (ref 101–111)
CO2: 23 mmol/L (ref 22–32)
CREATININE: 0.92 mg/dL (ref 0.61–1.24)
Calcium: 7.8 mg/dL — ABNORMAL LOW (ref 8.9–10.3)
GFR calc Af Amer: >60 mL/min (ref >60)
GFR calc non Af Amer: >60 mL/min (ref >60)
GLUCOSE: 88 mg/dL (ref 65–99)
POTASSIUM: 4 mmol/L (ref 3.5–5.1)
SODIUM: 126 mmol/L — AB (ref 135–145)

## 2014-11-18 MED ORDER — AMOXICILLIN-POT CLAVULANATE 875-125 MG PO TABS: 1.0000 | ORAL_TABLET | Freq: Two times a day (BID) | ORAL | Status: DC

## 2014-11-18 MED ORDER — HYDROCODONE-ACETAMINOPHEN 5-325 MG PO TABS: 1.0000 | ORAL_TABLET | ORAL | Status: DC | PRN

## 2014-11-18 NOTE — Clinical Social Work Placement (Signed)
   CLINICAL SOCIAL WORK PLACEMENT  NOTE  Date:  11/18/2014  Patient Details  Name: MACKINLEY KIEHN MRN: [id number] Date of Birth: 11/22/1927  Clinical Social Work is seeking post-discharge placement for this patient at the Skilled  Nursing Facility level of care (*CSW will initial, date and re-position this form in  chart as items are completed):  Yes   Patient/family provided with [facility] Department's list of facilities offering this level of care within the geographic area requested by the patient (or if unable, by the patient's family).  Yes   Patient/family informed of their freedom to choose among providers that offer the needed level of care, that participate in Medicare, Medicaid or managed care program needed by the patient, have an available bed and are willing to accept the patient.  Yes   Patient/family informed of 's ownership interest in [facility] and [facility], as well as of the fact that they are under no obligation to receive care at these facilities.  PASRR submitted to EDS on       PASRR number received on       Existing PASRR number confirmed on 11/18/14     FL2 transmitted to all facilities in geographic area requested by pt/family on 11/18/14     FL2 transmitted to all facilities within larger geographic area on       Patient informed that his/her managed care company has contracts with or will negotiate with certain facilities, including the following:        Yes   Patient/family informed of bed offers received.  Patient chooses bed at [facility]     Physician recommends and patient chooses bed at      Patient to be transferred to [facility] on 11/18/14.  Patient to be transferred to facility by Ambulance     Patient family notified on 11/18/14 of transfer.  Name of family member notified:  Allard Lightsey - patient son over the phone     PHYSICIAN       Additional Comment:     Macario Golds, LCSW [phone]

## 2014-11-18 NOTE — Progress Notes (Signed)
Patient ID: Hunter Choi, male   DOB: [birth year], 81 y.o.   MRN: [id number]    Subjective: Pt looks well this morning. Hungry and tolerating clears with no issues.  No pain.  Objective: Vital signs in last 24 hours: Temp:  [97.8 F (36.6 C)-100 F (37.8 C)] 97.8 F (36.6 C) (08/23 0508) Pulse Rate:  [52-70] 60 (08/23 0508) Resp:  [16-22] 22 (08/23 0508) BP: (123-152)/(44-58) 130/52 mmHg (08/23 0508) SpO2:  [95 %-98 %] 97 % (08/23 0508) Weight:  [71.4 kg (157 lb 6.5 oz)] 71.4 kg (157 lb 6.5 oz) (08/23 0508) Last BM Date: 11/17/14  Intake/Output from previous day: 08/22 0701 - 08/23 0700 In: 1608.3 [P.O.:340; I.V.:1268.3] Out: 1703 [Urine:1675; Drains:28] Intake/Output this shift:    PE: Abd: soft, NT, drain with hydrops present, +BS, ND  Lab Results:   Recent Labs  11/18/14 0716  WBC 6.3  HGB 9.9*  HCT 30.4*  PLT 127*   BMET  Recent Labs  11/17/14 0503 11/18/14 0716  NA 125* 126*  K 4.0 4.0  CL 94* 96*  CO2 22 23  GLUCOSE 92 88  BUN 12 6  CREATININE 1.02 0.92  CALCIUM 7.7* 7.8*   PT/INR  Recent Labs  11/16/14 1045  LABPROT 16.3*  INR 1.30   CMP     Component Value Date/Time   NA 126* 11/18/2014 0716   K 4.0 11/18/2014 0716   CL 96* 11/18/2014 0716   CO2 23 11/18/2014 0716   GLUCOSE 88 11/18/2014 0716   BUN 6 11/18/2014 0716   CREATININE 0.92 11/18/2014 0716   CALCIUM 7.8* 11/18/2014 0716   PROT 5.8* 11/17/2014 1256   ALBUMIN 2.9* 11/17/2014 1256   AST 70* 11/17/2014 1256   ALT 80* 11/17/2014 1256   ALKPHOS 291* 11/17/2014 1256   BILITOT 1.2 11/17/2014 1256   GFRNONAA >60 11/18/2014 0716   GFRAA >60 11/18/2014 0716   Lipase     Component Value Date/Time   LIPASE 43 11/14/2014 1730       Studies/Results: Ir Perc Cholecystostomy  11/17/2014   CLINICAL DATA:  Acute cholecystitis  EXAM: PERCUTANEOUS CHOLECYSTOSTOMY TUBE PLACEMENT WITH ULTRASOUND AND FLUOROSCOPIC GUIDANCE:  FLUOROSCOPY TIME:  36 seconds, 30 mGy  TECHNIQUE: The  procedure, risks (including but not limited to bleeding, infection, organ damage ), benefits, and alternatives were explained to the patient. Questions regarding the procedure were encouraged and answered. The patient understands and consents to the procedure. Survey ultrasound of the abdomen was performed and an appropriate skin entry site was identified. Skin site was marked, prepped with Betadine, and draped in usual sterile fashion, and infiltrated locally with 1% lidocaine.  Intravenous Fentanyl and Versed were administered as conscious sedation during continuous cardiorespiratory monitoring by the radiology RN, with a total moderate sedation time of 10 minutes.  Under real-time ultrasound guidance, gallbladder was accessed using a transhepatic approach with a 21-gauge needle. Ultrasound image documentation was saved. Bile returned through the hub. Needle was exchanged over a 018 guidewire for transitional dilator which allowed placement of 035 J wire. Over this, a 10.2 French pigtail catheter was advanced and formed centrally in the gallbladder lumen. Small contrast injection confirmed appropriate position. Catheter secured externally with 0 Prolene suture and placed external drain bag. Patient tolerated the procedure well.  COMPLICATIONS: COMPLICATIONS none  IMPRESSION: 1. Technically successful percutaneous cholecystostomy tube placement with ultrasound and fluoroscopic guidance.   Electronically Signed   By: Corlis Leak M.D.   On: 11/17/2014 09:10  Anti-infectives: Anti-infectives    Start     Dose/Rate Route Frequency Ordered Stop   11/17/14 1115  amoxicillin-clavulanate (AUGMENTIN) 875-125 MG per tablet 1 tablet     1 tablet Oral Every 12 hours 11/17/14 1042     11/15/14 0200  piperacillin-tazobactam (ZOSYN) IVPB 3.375 g  Status:  Discontinued     3.375 g 12.5 mL/hr over 240 Minutes Intravenous 3 times per day 11/15/14 0015 11/17/14 1042   11/14/14 1815  piperacillin-tazobactam (ZOSYN) IVPB  3.375 g     3.375 g 100 mL/hr over 30 Minutes Intravenous  Once 11/14/14 1809 11/14/14 1921       Assessment/Plan  1. Acute cholecystitis, s/p perc chole drain -cont drain placement -can follow up with Dr. Andrey Campanile in 3-4 weeks to discuss elective cholecystectomy if felt to be deemed appropriate at that time -advance to low fat diet -drain cx shows gram variable rods -cont abx therapy for a total of 7 days.  When medically stable to go home he can be transitioned to oral augmentin.   LOS: 4 days    Ladarryl Wrage E 11/18/2014, 9:03 AM Pager: [phone]

## 2014-11-18 NOTE — Progress Notes (Signed)
Patient discharged to [facility] via EMS. IV sites removed x2.  Pt. Alert without complaints. Pt. Family informed of transfer per SW staff. Report called to Levy Sjogren, LPN at [facility].

## 2014-11-18 NOTE — Progress Notes (Signed)
Referring Physician(s): CCS  Chief Complaint: Cholecystits  Subjective: F/u s/p perc chole drain placed yesterday. Pt feeling ok. Some soreness Diet to be advanced today  Allergies: Ciprofloxacin  Medications:  Current facility-administered medications:  .  0.9 %  sodium chloride infusion, 250 mL, Intravenous, PRN, Rhona Raider Stinson, DO .  0.9 %  sodium chloride infusion, , Intravenous, Continuous, Marinda Elk, MD, Last Rate: 100 mL/hr at 11/17/14 2232 .  acetaminophen (TYLENOL) tablet 650 mg, 650 mg, Oral, Q6H PRN, Army Chaco, NP, 650 mg at 11/16/14 0142 .  amLODipine (NORVASC) tablet 5 mg, 5 mg, Oral, Q1500, Rhona Raider Stinson, DO, 5 mg at 11/17/14 1422 .  amoxicillin-clavulanate (AUGMENTIN) 875-125 MG per tablet 1 tablet, 1 tablet, Oral, Q12H, Marinda Elk, MD, 1 tablet at 11/17/14 2232 .  aspirin EC tablet 81 mg, 81 mg, Oral, Daily, Rhona Raider Stinson, DO, 81 mg at 11/17/14 1006 .  diazepam (VALIUM) tablet 5 mg, 5 mg, Oral, Q1500, Rhona Raider Stinson, DO, 5 mg at 11/17/14 1422 .  folic acid (FOLVITE) tablet 3 mg, 3 mg, Oral, Daily, Rhona Raider Stinson, DO, 3 mg at 11/17/14 1006 .  heparin injection 5,000 Units, 5,000 Units, Subcutaneous, 3 times per day, Berneta Levins, PA-C, 5,000 Units at 11/18/14 [phone] .  HYDROcodone-acetaminophen (NORCO/VICODIN) 5-325 MG per tablet 1-2 tablet, 1-2 tablet, Oral, Q4H PRN, Oley Balm, MD .  iohexol (OMNIPAQUE) 300 MG/ML solution 50 mL, 50 mL, Other, Once PRN, Medication Radiologist, MD, 10 mL at 11/17/14 0857 .  levothyroxine (SYNTHROID, LEVOTHROID) tablet 50 mcg, 50 mcg, Oral, QAC breakfast, Rhona Raider Stinson, DO, 50 mcg at 11/18/14 0809 .  metoprolol tartrate (LOPRESSOR) tablet 12.5 mg, 12.5 mg, Oral, Daily, Marinda Elk, MD .  morphine 4 MG/ML injection 4 mg, 4 mg, Intravenous, Q4H PRN, Marinda Elk, MD .  ondansetron [facility]) injection 4 mg, 4 mg, Intravenous, Q6H PRN, Rhona Raider Stinson, DO .  pantoprazole  (PROTONIX) EC tablet 40 mg, 40 mg, Oral, Daily, Rhona Raider Stinson, DO, 40 mg at 11/17/14 1005 .  predniSONE (DELTASONE) tablet 5 mg, 5 mg, Oral, QODAY, Rhona Raider Stinson, DO, 5 mg at 11/17/14 1006 .  technetium TC 39M mebrofenin (CHOLETEC) injection 5 milli Curie, 5 milli Curie, Intravenous, Once PRN, Medication Radiologist, MD, 5 milli Curie at 11/15/14 0830    Vital Signs: BP 130/52 mmHg  Pulse 60  Temp(Src) 97.8 F (36.6 C) (Oral)  Resp 22  Ht 5\' 6"  (1.676 m)  Wt 157 lb 6.5 oz (71.4 kg)  BMI 25.42 kg/m2  SpO2 97%  Physical Exam RUQ drain intact, site clean Thin pale bile output with some debris/sludge   Imaging:  Ir Perc Cholecystostomy  11/17/2014   CLINICAL DATA:  Acute cholecystitis  EXAM: PERCUTANEOUS CHOLECYSTOSTOMY TUBE PLACEMENT WITH ULTRASOUND AND FLUOROSCOPIC GUIDANCE:  FLUOROSCOPY TIME:  36 seconds, 30 mGy  TECHNIQUE: The procedure, risks (including but not limited to bleeding, infection, organ damage ), benefits, and alternatives were explained to the patient. Questions regarding the procedure were encouraged and answered. The patient understands and consents to the procedure. Survey ultrasound of the abdomen was performed and an appropriate skin entry site was identified. Skin site was marked, prepped with Betadine, and draped in usual sterile fashion, and infiltrated locally with 1% lidocaine.  Intravenous Fentanyl and Versed were administered as conscious sedation during continuous cardiorespiratory monitoring by the radiology RN, with a total moderate sedation time of 10 minutes.  Under real-time ultrasound guidance, gallbladder  was accessed using a transhepatic approach with a 21-gauge needle. Ultrasound image documentation was saved. Bile returned through the hub. Needle was exchanged over a 018 guidewire for transitional dilator which allowed placement of 035 J wire. Over this, a 10.2 French pigtail catheter was advanced and formed centrally in the gallbladder lumen. Small  contrast injection confirmed appropriate position. Catheter secured externally with 0 Prolene suture and placed external drain bag. Patient tolerated the procedure well.  COMPLICATIONS: COMPLICATIONS none  IMPRESSION: 1. Technically successful percutaneous cholecystostomy tube placement with ultrasound and fluoroscopic guidance.   Electronically Signed   By: Corlis Leak M.D.   On: 11/17/2014 09:10    Labs:  CBC:  Recent Labs  05/25/14 0515 11/14/14 1730 11/15/14 0329 11/18/14 0716  WBC 7.4 10.5 10.6* 6.3  HGB 10.6* 10.2* 10.4* 9.9*  HCT 32.2* 31.0* 31.8* 30.4*  PLT 113* 54* 101* 127*    COAGS:  Recent Labs  11/16/14 1045  INR 1.30    BMP:  Recent Labs  11/15/14 0624 11/16/14 1045 11/17/14 0503 11/18/14 0716  NA 131* 128* 125* 126*  K 3.9 3.8 4.0 4.0  CL 100* 100* 94* 96*  CO2 22 19* 22 23  GLUCOSE 135* 172* 92 88  BUN 14 15 12 6   CALCIUM 8.0* 7.9* 7.7* 7.8*  CREATININE 1.10 1.21 1.02 0.92  GFRNONAA 59* 53* >60 >60  GFRAA >60 >60 >60 >60    LIVER FUNCTION TESTS:  Recent Labs  05/25/14 0515 11/14/14 1730 11/15/14 0624 11/17/14 1256  BILITOT 0.8 3.0* 2.7* 1.2  AST 20 412* 281* 70*  ALT 9 164* 182* 80*  ALKPHOS 48 410* 385* 291*  PROT 5.5* 5.7* 5.8* 5.8*  ALBUMIN 2.9* 3.3* 3.0* 2.9*    Assessment and Plan: Cholecystitis s/p perc chole drain Flush drain once daily after DC F/u plans per CCS  Signed: 11/19/14 11/18/2014, 10:25 AM   I spent a total of 15 Minutes at the the patient's bedside and on the patient's hospital floor or unit, greater than 50% of which was counseling/coordinating care for percutaneous cholecystostomy drain.

## 2014-11-18 NOTE — Clinical Social Work Note (Signed)
Clinical Social Work Assessment  Patient Details  Name: Hunter Choi MRN: [id number] Date of Birth: 1927/08/27  Date of referral:  11/18/14               Reason for consult:  Facility Placement                Permission sought to share information with:  Family Supports Permission granted to share information::  Yes, Verbal Permission Granted  Name::     Ilhan Madan   Relationship::  Son  Contact Information:  [phone]  Housing/Transportation Living arrangements for the past 2 months:  Apartment Source of Information:  Patient, Adult Children, Spouse Patient Interpreter Needed:  None Criminal Activity/Legal Involvement Pertinent to Current Situation/Hospitalization:  No - Comment as needed Significant Relationships:  Adult Children, Spouse Lives with:  Spouse, Adult Children Do you feel safe going back to the place where you live?  No Need for family participation in patient care:  Yes (Comment)  Care giving concerns:  Patient son and family friend at bedside demanding patient placement due to safety concerns in the home.  Patient wife with a recent stroke and requiring a significant amount of care, therefore family requests ST-SNF placement on patient behalf.   Social Worker assessment / plan:  Holiday representative met with patient and family at bedside to offer support and discuss patient needs at discharge.  Patient family states that patient is not able to return home and will need SNF placement.  Patient with a current discharge order and home health arrangements made with [facility] prior to family request for placement.  CSW met with patient family and CM to address patient family concerns.  CSW initiated SNF search and was able to locate a bed at [facility] for discharge today.  MD and patient family notified.  Clinical Social Worker facilitated patient discharge including contacting patient family and facility to confirm patient discharge plans.  Clinical information faxed  to facility and family agreeable with plan.  CSW arranged ambulance transport via [facility] to [place] .  RN to call report prior to discharge.  Clinical Social Worker will sign off for now as social work intervention is no longer needed. Please consult [place] again if new need arises.  Employment status:  Retired Nurse, adult PT Recommendations:  [place] / Referral to community resources:  [place]  Patient/Family's Response to care:  Patient and family verbalized appreciation for SNF consideration and formulating a plan that was in the best interest of the patient.  Patient family understanding of CSW role.  Patient/Family's Understanding of and Emotional Response to Diagnosis, Current Treatment, and Prognosis:  Patient with limited ability to comprehend his limitations and need for additional support.  Emotional Assessment Appearance:  Appears stated age Attitude/Demeanor/Rapport:  Inconsistent, Other (Confused) Affect (typically observed):  Unable to Assess Orientation:  Oriented to Self, Oriented to Place, Oriented to  Time, Oriented to Situation Alcohol / Substance use:  Not Applicable Psych involvement (Current and /or in the community):  No (Comment)  Discharge Needs  Concerns to be addressed:  Discharge Planning Concerns Readmission within the last 30 days:  No Current discharge risk:  Lack of support system Barriers to Discharge:  No Barriers Identified  Barbette Or, [place]

## 2014-11-18 NOTE — Discharge Instructions (Signed)
Cholecystostomy  °The gallbladder is a pear-shaped organ that lies beneath the liver on the right side of the body. The gallbladder stores bile, a fluid that helps the body digest fats. However, sometimes bile and other fluids build up in the gallbladder because of an obstruction (for example, a gallstones). This can cause fever, pain, swelling, nausea and other serious symptoms. °The procedure used to drain these fluids is called a cholecystostomy. A tube is inserted into the gallbladder. Fluid drains through the tube into a plastic bag outside the body. This procedure is usually done on people who are admitted to the hospital. °The procedure is often recommended for people who cannot have gallbladder surgery right away, usually because they are too ill to make it through surgery. The cholecystostomy tube is usually temporary, until surgery can be done. °RISKS AND COMPLICATIONS °Although rare, complications can include: °· Clogging of the tube. °· Infection in or around the drain site. Antibiotics might be prescribed for the infection. Or, another tube might be inserted to drain the infected fluid. °· Internal bleeding from the liver. °BEFORE THE PROCEDURE  °· Try to quit smoking several weeks before the procedure. Smoking can slow healing. °· Arrange for someone to drive you home from the hospital. °· Right before your procedure, avoid all foods and liquids after midnight. This includes coffee, tea and water. °· On the day of the procedure, arrive early to fill out all the paperwork. °PROCEDURE °You will be given a sedative to make you sleepy and a local anesthetic to numb the skin. Next, a small cut is made in the abdomen. Then a tube is threaded through the cut into the gallbladder. The procedure is usually done with ultrasound to guide the tube into the gallbladder. Once the tube is in place, the drain is secured to the skin with a stitch. The tube is then connected to a drainage bag.  °AFTER THE PROCEDURE   °· People who have a cholecystostomy usually stay in the hospital for several days because they are so ill. You might not be able to eat for the first few days. Instead, you will be connected to an IV for fluids and nutrients. °· The procedure does not cure the blockage that caused the fluid to build up in the first place. Because of this, the gallbladder will need to be removed in the future. The drain is removed at that time. °HOME CARE INSTRUCTIONS  °Be sure to follow your healthcare provider's instructions carefully. You may shower but avoid tub baths and swimming until your caregiver says it is OK. Eat and drink according to the directions you have been given. And be sure to make all follow-up appointments.  °Call your healthcare provider if you notice new pain, redness or swelling around the wound. °SEEK IMMEDIATE MEDICAL CARE IF:  °· There is increased abdominal pain. °· Nausea or vomiting occurs. °· You develop a fever. °· The drainage tube comes out of the abdomen. °Document Released: 06/10/2008 Document Revised: 06/06/2011 Document Reviewed: 06/10/2008 °ExitCare® Patient Information ©2015 [employer]. This information is not intended to replace advice given to you by your health care provider. Make sure you discuss any questions you have with your health care provider. ° °

## 2014-11-18 NOTE — Progress Notes (Signed)
Physical Therapy Treatment Patient Details Name: Hunter Choi MRN: [id number] DOB: 11/29/1927 Today's Date: 11/18/2014    History of Present Illness Pt is a 81 y/o M admitted w/ c/o abdomina lpain, nausea, vomiting, and fever.  Pt dx w/ acute cholecystitis and pt had placement of percutaneous cholecystostomy tube.  Pt's PMH includes HTN, hypothyroidism, aortic stenosis, AAA, CABG x4, CAD, stroke, a fib.    PT Comments    Pt was able to benefit from his therapy session today as PT noted his limited balance and endurance, confirming the appropriateness of SNF admission.  He is scheduled to leave for SNF from hospital today, and will see in PT tomorrow here if he remains in hospital for unforseen reason.  Follow Up Recommendations  SNF     Equipment Recommendations  None recommended by PT    Recommendations for Other Services       Precautions / Restrictions Precautions Precautions: Fall Restrictions Weight Bearing Restrictions: No    Mobility  Bed Mobility               General bed mobility comments: up when PT entered  Transfers Overall transfer level: Needs assistance Equipment used: Rolling walker (2 wheeled);1 person hand held assist Transfers: Sit to/from [employer] Sit to Stand: Min assist Stand pivot transfers: Min assist       General transfer comment: Pt is more capable than yesterday but is confused to a degree and needs close cues for sequence and safety, and to notice his lines and impediments  Ambulation/Gait Ambulation/Gait assistance: Min assist Ambulation Distance (Feet): 40 Feet Assistive device: Rolling walker (2 wheeled);1 person hand held assist Gait Pattern/deviations: Step-through pattern;Wide base of support;Trunk flexed;Shuffle Gait velocity: reduced Gait velocity interpretation: Below normal speed for age/gender General Gait Details: unsafe with all maneuvering around obstacles and with turns   Stairs             Wheelchair Mobility    Modified Rankin (Stroke Patients Only)       Balance Overall balance assessment: Needs assistance Sitting-balance support: Feet supported Sitting balance-Leahy Scale: Good       Standing balance-Leahy Scale: Poor                      Cognition Arousal/Alertness: Awake/alert Behavior During Therapy: Impulsive Overall Cognitive Status: No family/caregiver present to determine baseline cognitive functioning       Memory: Decreased recall of precautions;Decreased short-term memory              Exercises      General Comments General comments (skin integrity, edema, etc.): Family talking about CIR but SNF is truly the appropriate care level with his reduced endurance and cognition.      Pertinent Vitals/Pain Pain Assessment: No/denies pain    Home Living                      Prior Function            PT Goals (current goals can now be found in the care plan section) Acute Rehab PT Goals Patient Stated Goal: none stated    Frequency  Min 3X/week    PT Plan Current plan remains appropriate    Co-evaluation             End of Session   Activity Tolerance: Patient limited by fatigue;Patient limited by lethargy Patient left: in chair;with call bell/phone within reach     Time: 8756-4332  PT Time Calculation (min) (ACUTE ONLY): 33 min  Charges:  $Gait Training: 8-22 mins $Therapeutic Exercise: 8-22 mins                    G Codes:      Ivar Drape 12-10-2014, 4:33 PM   Samul Dada, PT MS Acute Rehab Dept. Number: [facility] [id number] and [facility] [phone]

## 2014-11-18 NOTE — Care Management Note (Addendum)
Case Management Note  Patient Details  Name: Hunter Choi MRN: [id number] Date of Birth: [birth year]  Subjective/Objective:    NCM spoke with patient's wife at home phone number, she chose [facility] , referral made to [facility],  Soc will begin 24-48 hrs post discharge.  HHRN  For drain care and HHPT, HHOT. Patient has 24 hr care at home.    NCM notified at 2:15 that patient's other brother is here and a family physician friend of family stating they can not take care of patient at home because it takes two people to get him up , Per MD he tried to convince patient to go to a snf but patient refused and also MD spoke with [name], patient's brother who states patient is going home.  NCM also spoke with [name] and patient's wife who stated patient will be going home and not to a snf.  The other brother and family friend physician has convinced patient to go to a snf now.  MD states he will be sending patient to snf . NCM called [name] to cancel [name] services.   Per CSW , patient will go to [place] today.           Action/Plan:   Expected Discharge Date:                  Expected Discharge Plan:  Home w Home Health Services  In-House Referral:     Discharge planning Services  CM Consult  Post Acute Care Choice:  Home Health Choice offered to:  Spouse  DME Arranged:    DME Agency:     HH Arranged:  RN, PT, OT HH Agency:  [facility]  Status of Service:  Completed, signed off  Medicare Important Message Given:  Yes-second notification given Date Medicare IM Given:    Medicare IM give by:    Date Additional Medicare IM Given:    Additional Medicare Important Message give by:     If discussed at Long Length of Stay Meetings, dates discussed:    Additional Comments:  [name], RN 11/18/2014, 12:31 PM

## 2014-11-18 NOTE — Discharge Summary (Signed)
Physician Discharge Summary  Hunter Choi QQV:[id number] DOB: [birth year] DOA: 11/14/2014  PCP: Kristie Cowman, MD  Admit date: 11/14/2014 Discharge date: 11/18/2014  Time spent: 35 minutes  Recommendations for Outpatient Follow-up:  1. Follow up with surgery as an outpatient to evaluate drain 2. Follow up with radiology in 4-6 weeks for drain removal 3. Will go home with home health and Physical therapy  Discharge Diagnoses:  Active Problems:   Sepsis   Elevated troponin I level   Urinary tract infection   Cholecystitis, acute   Lactic acidosis   Elevated LFTs   Cholecystitis   Blood poisoning   Discharge Condition: stable  Diet recommendation: regular  Filed Weights   11/16/14 1737 11/17/14 0500 11/18/14 0508  Weight: 72.2 kg (159 lb 2.8 oz) 71.05 kg (156 lb 10.2 oz) 71.4 kg (157 lb 6.5 oz)    History of present illness:  81 year old male with past medical history of essential hypertension, inflammatory arthritis AAA coronary artery sees with CABG and combined systolic and diastolic heart failure that comes into the hospital with complaining of fever nausea and vomiting and was diagnosed with acalculous cholecystitis.  Hospital Course:  Sepsis due to acute cholecystitis: Started empirically on Zosyn on admission surgery was consulted who recommended a percutaneous drain. After 2 days of treatment he defervesced, culture of bile was sent, surgery recommended to change to Augmentin which we will continue for 7 days. He is status post biliary percutaneous drain on 11/17/2014. Physical therapy was consulted who recommended SNIF versus 24-hour home care. I discussed with the son and both of his grown sons are at home taking care of him and his mom. So he was discharged home with physical therapy and home health.  Elevated troponins: In the setting of demand ischemia, patient remained chest pain-free. After his sepsis was corrected his cardiac markers trended  down.  Hyponatremia: Likely due to hypovolemia does resolve with IV hydration.  Thrombocytopenia: In the setting of sepsis, now improved.  Normocytic anemia: Hemoglobin remained stable follow-up with PCP as an outpatient.    Procedures:  CXR  HIDA scan   CT abd and pelvis  Consultations:  Cardiology  Surgery  Discharge Exam: Filed Vitals:   11/18/14 1044  BP: 132/47  Pulse: 58  Temp:   Resp:     General: A&O x3 Cardiovascular: RRR Respiratory: good air movement CTA B/L  Discharge Instructions   Discharge Instructions    Diet - low sodium heart healthy    Complete by:  As directed      Increase activity slowly    Complete by:  As directed           Current Discharge Medication List    START taking these medications   Details  amoxicillin-clavulanate (AUGMENTIN) 875-125 MG per tablet Take 1 tablet by mouth every 12 (twelve) hours. Qty: 14 tablet, Refills: 0    HYDROcodone-acetaminophen (NORCO/VICODIN) 5-325 MG per tablet Take 1-2 tablets by mouth every 4 (four) hours as needed for moderate pain. Qty: 30 tablet, Refills: 0      CONTINUE these medications which have NOT CHANGED   Details  amLODipine (NORVASC) 5 MG tablet Take 5 mg by mouth daily at 3 pm.    aspirin EC 81 MG EC tablet Take 1 tablet (81 mg total) by mouth daily.    diazepam (VALIUM) 5 MG tablet Take 5 mg by mouth daily at 3 pm.     folic acid (FOLVITE) 1 MG tablet Take 3 mg by  mouth daily.    levothyroxine (SYNTHROID, LEVOTHROID) 50 MCG tablet Take 50 mcg by mouth daily. Refills: 0    methotrexate 2.5 MG tablet Take 25 mg by mouth once a week. Every Thursday    metoprolol tartrate (LOPRESSOR) 25 MG tablet Take 1 tablet (25 mg total) by mouth daily. Qty: 60 tablet, Refills: 1    omeprazole (PRILOSEC) 20 MG capsule Take 20 mg by mouth daily.    predniSONE (DELTASONE) 5 MG tablet Take 5 mg by mouth every other day.        Allergies  Allergen Reactions  . Ciprofloxacin  Other (See Comments)    REACTION: mild delirium   Follow-up Information    Follow up with Atilano Ina, MD. Schedule an appointment as soon as possible for a visit in 4 weeks.   Specialty:  General Surgery   Why:  for drain check   Contact information:   [address] [place] [address] [phone]        The results of significant diagnostics from this hospitalization (including imaging, microbiology, ancillary and laboratory) are listed below for reference.    Significant Diagnostic Studies: Dg Chest 2 View  11/14/2014   CLINICAL DATA:  Shortness of breath and low grade fever this evening.  EXAM: CHEST  2 VIEW  COMPARISON:  February 19, 2014  FINDINGS: The heart size and mediastinal contours are stable. Heart size is enlarged. Patient is status post prior median sternotomy, CABG, transcatheter aortic valve replacement. There is no focal infiltrate, pulmonary edema, or pleural effusion. The visualized skeletal structures are stable.  IMPRESSION: No active cardiopulmonary disease.   Electronically Signed   By: Sherian Rein M.D.   On: 11/14/2014 19:14   Nm Hepatobiliary Liver Func  11/15/2014   CLINICAL DATA:  Cholecystitis on prior CT.  EXAM: NUCLEAR MEDICINE HEPATOBILIARY IMAGING  TECHNIQUE: Sequential images of the abdomen were obtained out to 60 minutes following intravenous administration of radiopharmaceutical.  RADIOPHARMACEUTICALS:  5.15 MCi Tc-29m  Choletec IV  COMPARISON:  CT 11/14/2014.  FINDINGS: There is prompt hepatic uptake of radiotracer. Excretion of contrast into the common bile duct is visible as expected at between 10 and 15 minutes. Normal spill of radiotracer into the duodenum.  There was nonvisualization of the gallbladder after 1 hour. The study was continued with administration of IV morphine. Continued imaging failed to demonstrate the gallbladder. The findings are compatible with acute cholecystitis without common bile duct obstruction.  Liver washout  is relatively poor, suggesting an element of hepatic failure.  IMPRESSION: Nonvisualization of the gallbladder compatible with acute cholecystitis. Poor washout of radiotracer in the liver suggests an element of hepatic dysfunction or partial obstruction of the common bile duct.   Electronically Signed   By: Andreas Newport M.D.   On: 11/15/2014 12:30   Ct Abdomen Pelvis W Contrast  11/14/2014   CLINICAL DATA:  Fever and abdominal pain.  Initial encounter.  EXAM: CT ABDOMEN AND PELVIS WITH CONTRAST  TECHNIQUE: Multidetector CT imaging of the abdomen and pelvis was performed using the standard protocol following bolus administration of intravenous contrast.  CONTRAST:  OMNIPAQUE IOHEXOL 300 MG/ML  SOLN  COMPARISON:  05/23/2014 CTA.  FINDINGS: Musculoskeletal: No aggressive osseous lesions. Severe degenerative disc disease. LEFT hip hemiarthroplasty. RIGHT hip osteoarthritis.  Lung Bases: Atelectasis.  Liver:  Mild within normal limits.  Spleen:  Normal.  Gallbladder: Distended. There is pericholecystic fluid in the gallbladder fossa. No calcified stones are identified.  Common bile  duct: Common bile duct appears within normal limits. No calcified common duct stone identified.  Pancreas:  Atrophic.  No mass lesion or inflammatory change.  Adrenal glands:  Normal bilaterally.  Kidneys: Renal scarring is present. Artifactual decreased enhancement in the RIGHT inferior renal pole from extremities. LEFT renal scarring. Excretion of contrast appears normal bilaterally.  Stomach: Hyper trophic appearance of the stomach proximally, suggesting hypertrophic gastritis. No change from prior.  Small bowel:  No small bowel obstruction or inflammatory change.  Colon: Large stool burden. Colonic diverticulosis. No diverticulitis.  Pelvic Genitourinary: Thickening of the urinary bladder with cellules along the LEFT lateral wall. Bladder is partially obscured by artifact from LEFT hip arthroplasty.  Peritoneum: No free air.   Vascular/lymphatic: Aortoiliac aneurysms bilaterally appear similar to the prior exam. No rupture or acute vascular abnormality. No retroperitoneal hematoma/ hemorrhage. No extravasation of contrast.  Body Wall: Fat containing subxiphoid hernia.  IMPRESSION: 1. Pericholecystic fluid is new since the prior exam and is potentially associated with acute cholecystitis. Correlate with laboratory parameters and consider a RIGHT upper quadrant ultrasound if warranted. 2. Unchanged iliofemoral aneurysms. 3. Bladder wall thickening and cellules suggesting chronic bladder outflow obstruction. Recommend correlation with urinalysis for cystitis.   Electronically Signed   By: Andreas Newport M.D.   On: 11/14/2014 19:32   Ir Perc Cholecystostomy  11/17/2014   CLINICAL DATA:  Acute cholecystitis  EXAM: PERCUTANEOUS CHOLECYSTOSTOMY TUBE PLACEMENT WITH ULTRASOUND AND FLUOROSCOPIC GUIDANCE:  FLUOROSCOPY TIME:  36 seconds, 30 mGy  TECHNIQUE: The procedure, risks (including but not limited to bleeding, infection, organ damage ), benefits, and alternatives were explained to the patient. Questions regarding the procedure were encouraged and answered. The patient understands and consents to the procedure. Survey ultrasound of the abdomen was performed and an appropriate skin entry site was identified. Skin site was marked, prepped with Betadine, and draped in usual sterile fashion, and infiltrated locally with 1% lidocaine.  Intravenous Fentanyl and Versed were administered as conscious sedation during continuous cardiorespiratory monitoring by the radiology RN, with a total moderate sedation time of 10 minutes.  Under real-time ultrasound guidance, gallbladder was accessed using a transhepatic approach with a 21-gauge needle. Ultrasound image documentation was saved. Bile returned through the hub. Needle was exchanged over a 018 guidewire for transitional dilator which allowed placement of 035 J wire. Over this, a 10.2 French pigtail  catheter was advanced and formed centrally in the gallbladder lumen. Small contrast injection confirmed appropriate position. Catheter secured externally with 0 Prolene suture and placed external drain bag. Patient tolerated the procedure well.  COMPLICATIONS: COMPLICATIONS none  IMPRESSION: 1. Technically successful percutaneous cholecystostomy tube placement with ultrasound and fluoroscopic guidance.   Electronically Signed   By: Corlis Leak M.D.   On: 11/17/2014 09:10    Microbiology: Recent Results (from the past 240 hour(s))  Blood Culture (routine x 2)     Status: None (Preliminary result)   Collection Time: 11/14/14  5:30 PM  Result Value Ref Range Status   Specimen Description BLOOD LEFT HAND  Final   Special Requests BOTTLES DRAWN AEROBIC AND ANAEROBIC 5CC  Final   Culture NO GROWTH 3 DAYS  Final   Report Status PENDING  Incomplete  Blood Culture (routine x 2)     Status: None (Preliminary result)   Collection Time: 11/14/14  5:33 PM  Result Value Ref Range Status   Specimen Description BLOOD LEFT FOREARM  Final   Special Requests BOTTLES DRAWN AEROBIC AND ANAEROBIC 5CC  Final  Culture NO GROWTH 3 DAYS  Final   Report Status PENDING  Incomplete  Urine culture     Status: None   Collection Time: 11/14/14  6:49 PM  Result Value Ref Range Status   Specimen Description URINE, RANDOM  Final   Special Requests NONE  Final   Culture >=100,000 COLONIES/mL ENTEROCOCCUS SPECIES  Final   Report Status 11/17/2014 FINAL  Final   Organism ID, Bacteria ENTEROCOCCUS SPECIES  Final      Susceptibility   Enterococcus species - MIC*    AMPICILLIN <=2 SENSITIVE Sensitive     LEVOFLOXACIN 1 SENSITIVE Sensitive     NITROFURANTOIN <=16 SENSITIVE Sensitive     VANCOMYCIN 1 SENSITIVE Sensitive     * >=100,000 COLONIES/mL ENTEROCOCCUS SPECIES  MRSA PCR Screening     Status: None   Collection Time: 11/15/14  1:45 AM  Result Value Ref Range Status   MRSA by PCR NEGATIVE NEGATIVE Final    Comment:         The GeneXpert MRSA Assay (FDA approved for NASAL specimens only), is one component of a comprehensive MRSA colonization surveillance program. It is not intended to diagnose MRSA infection nor to guide or monitor treatment for MRSA infections.   Culture, body fluid-bottle     Status: None (Preliminary result)   Collection Time: 11/17/14  9:03 AM  Result Value Ref Range Status   Specimen Description FLUID BILE  Final   Special Requests NONE  Final   Gram Stain   Final    GRAM VARIABLE ROD IN BOTH AEROBIC AND ANAEROBIC BOTTLES    Culture GRAM NEGATIVE RODS  Final   Report Status PENDING  Incomplete  Gram stain     Status: None   Collection Time: 11/17/14  9:03 AM  Result Value Ref Range Status   Specimen Description FLUID BILE  Final   Special Requests NONE  Final   Gram Stain NO WBC SEEN ABUNDANT GRAM VARIABLE ROD   Final   Report Status 11/17/2014 FINAL  Final     Labs: Basic Metabolic Panel:  Recent Labs Lab 11/14/14 1730 11/15/14 0329 11/15/14 0624 11/16/14 1045 11/17/14 0503 11/18/14 0716  NA 130*  --  131* 128* 125* 126*  K 3.6  --  3.9 3.8 4.0 4.0  CL 98*  --  100* 100* 94* 96*  CO2 21*  --  22 19* 22 23  GLUCOSE 99  --  135* 172* 92 88  BUN 19  --  14 15 12 6   CREATININE 1.13  --  1.10 1.21 1.02 0.92  CALCIUM 8.4*  --  8.0* 7.9* 7.7* 7.8*  MG  --  1.6*  --  2.1  --   --   PHOS  --  3.2  --   --   --   --    Liver Function Tests:  Recent Labs Lab 11/14/14 1730 11/15/14 0624 11/17/14 1256  AST 412* 281* 70*  ALT 164* 182* 80*  ALKPHOS 410* 385* 291*  BILITOT 3.0* 2.7* 1.2  PROT 5.7* 5.8* 5.8*  ALBUMIN 3.3* 3.0* 2.9*    Recent Labs Lab 11/14/14 1730  LIPASE 43   No results for input(s): AMMONIA in the last 168 hours. CBC:  Recent Labs Lab 11/14/14 1730 11/15/14 0329 11/18/14 0716  WBC 10.5 10.6* 6.3  NEUTROABS 9.1*  --   --   HGB 10.2* 10.4* 9.9*  HCT 31.0* 31.8* 30.4*  MCV 89.6 89.1 87.6  PLT 54* 101* 127*  Cardiac  Enzymes:  Recent Labs Lab 11/15/14 0329 11/15/14 0624 11/15/14 1235 11/15/14 1835 11/15/14 2320  TROPONINI 0.62* 0.83* 1.36* 0.68* 0.59*   BNP: BNP (last 3 results) No results for input(s): BNP in the last 8760 hours.  ProBNP (last 3 results) No results for input(s): PROBNP in the last 8760 hours.  CBG:  Recent Labs Lab 11/15/14 1101  GLUCAP 110*       Signed:  Marinda Elk  [facility] 11/18/2014, 12:15 PM

## 2014-11-18 NOTE — Progress Notes (Signed)
[facility] PROGRESS NOTE   Assessment/Plan: Sepsis due to acute Cholecystitis - Started on empiric antibiotics Zosyn on admission, percutaneous tube placed, he is defervece. - Bile fluid cultures sent, likely low probability showing a species as he had spent a few days on IV antibiotics in house. - Now on augmentin. - Status post biliary peritendinous drainage placed on 11/17/2014.  -PT consult recommended SNF. PT refused SNF, I have called his family several time and have been unsucessfull, awaiting placement. - We to follow-up with surgery as an outpatient.  Elevated troponin I level: - In the setting of sepsis likely demand ischemia and an 81 year old. Has remained chest pain-free. - Cardiac biomarkers are trending down.  Hyponatremia: - improved with IV hydration. - KVO IV fluids.  Elevated LFTs - Likely due to cholecystitis, improved.   Thrombocytopenia: Likely due to sepsis, is improving now.  Normocytic anemia: - Will follow up with PCP as an outpatient. - HBG stable.  Code Status: full Family Communication: none  Disposition Plan: home in 1-2 days   Consultants:  Surgery  cardiology  Procedures:  CT abd and pelvis  HIDA scan pending  Antibiotics:  Zosyn  HPI/Subjective: Feels better does not want to go home. Has refused skilled nursing facility.  Objective: Filed Vitals:   11/17/14 2210 11/18/14 0508 11/18/14 1029 11/18/14 1044  BP: 131/53 130/52 114/49 132/47  Pulse: 52 60 58 58  Temp: 98.3 F (36.8 C) 97.8 F (36.6 C)    TempSrc: Oral Oral    Resp: 20 22    Height:      Weight:  71.4 kg (157 lb 6.5 oz)    SpO2: 98% 97%  98%    Intake/Output Summary (Last 24 hours) at 11/18/14 1124 Last data filed at 11/18/14 0900  Gross per 24 hour  Intake 1968.33 ml  Output   1703 ml  Net 265.33 ml   Filed Weights   11/16/14 1737 11/17/14 0500 11/18/14 0508  Weight: 72.2 kg (159 lb 2.8 oz) 71.05 kg (156 lb 10.2 oz) 71.4 kg (157 lb  6.5 oz)    Exam:  General: Alert, awake, oriented x3, in no acute distress.  HEENT: No bruits, no goiter.  Heart: Regular rate and rhythm. Lungs: Good air movement, bilateral air movement.  Abdomen: Soft, nontender, no upper quadrant tenderness, no suprapubic tenderness, nondistended, positive bowel sounds.  Neuro: Grossly intact, nonfocal.   Data Reviewed: Basic Metabolic Panel:  Recent Labs Lab 11/14/14 1730 11/15/14 0329 11/15/14 0624 11/16/14 1045 11/17/14 0503 11/18/14 0716  NA 130*  --  131* 128* 125* 126*  K 3.6  --  3.9 3.8 4.0 4.0  CL 98*  --  100* 100* 94* 96*  CO2 21*  --  22 19* 22 23  GLUCOSE 99  --  135* 172* 92 88  BUN 19  --  14 15 12 6   CREATININE 1.13  --  1.10 1.21 1.02 0.92  CALCIUM 8.4*  --  8.0* 7.9* 7.7* 7.8*  MG  --  1.6*  --  2.1  --   --   PHOS  --  3.2  --   --   --   --    Liver Function Tests:  Recent Labs Lab 11/14/14 1730 11/15/14 0624 11/17/14 1256  AST 412* 281* 70*  ALT 164* 182* 80*  ALKPHOS 410* 385* 291*  BILITOT 3.0* 2.7* 1.2  PROT 5.7* 5.8* 5.8*  ALBUMIN 3.3* 3.0* 2.9*    Recent Labs Lab 11/14/14  1730  LIPASE 43   No results for input(s): AMMONIA in the last 168 hours. CBC:  Recent Labs Lab 11/14/14 1730 11/15/14 0329 11/18/14 0716  WBC 10.5 10.6* 6.3  NEUTROABS 9.1*  --   --   HGB 10.2* 10.4* 9.9*  HCT 31.0* 31.8* 30.4*  MCV 89.6 89.1 87.6  PLT 54* 101* 127*   Cardiac Enzymes:  Recent Labs Lab 11/15/14 0329 11/15/14 0624 11/15/14 1235 11/15/14 1835 11/15/14 2320  TROPONINI 0.62* 0.83* 1.36* 0.68* 0.59*   BNP (last 3 results) No results for input(s): BNP in the last 8760 hours.  ProBNP (last 3 results) No results for input(s): PROBNP in the last 8760 hours.  CBG:  Recent Labs Lab 11/15/14 1101  GLUCAP 110*    Recent Results (from the past 240 hour(s))  Blood Culture (routine x 2)     Status: None (Preliminary result)   Collection Time: 11/14/14  5:30 PM  Result Value Ref Range  Status   Specimen Description BLOOD LEFT HAND  Final   Special Requests BOTTLES DRAWN AEROBIC AND ANAEROBIC 5CC  Final   Culture NO GROWTH 3 DAYS  Final   Report Status PENDING  Incomplete  Blood Culture (routine x 2)     Status: None (Preliminary result)   Collection Time: 11/14/14  5:33 PM  Result Value Ref Range Status   Specimen Description BLOOD LEFT FOREARM  Final   Special Requests BOTTLES DRAWN AEROBIC AND ANAEROBIC 5CC  Final   Culture NO GROWTH 3 DAYS  Final   Report Status PENDING  Incomplete  Urine culture     Status: None   Collection Time: 11/14/14  6:49 PM  Result Value Ref Range Status   Specimen Description URINE, RANDOM  Final   Special Requests NONE  Final   Culture >=100,000 COLONIES/mL ENTEROCOCCUS SPECIES  Final   Report Status 11/17/2014 FINAL  Final   Organism ID, Bacteria ENTEROCOCCUS SPECIES  Final      Susceptibility   Enterococcus species - MIC*    AMPICILLIN <=2 SENSITIVE Sensitive     LEVOFLOXACIN 1 SENSITIVE Sensitive     NITROFURANTOIN <=16 SENSITIVE Sensitive     VANCOMYCIN 1 SENSITIVE Sensitive     * >=100,000 COLONIES/mL ENTEROCOCCUS SPECIES  MRSA PCR Screening     Status: None   Collection Time: 11/15/14  1:45 AM  Result Value Ref Range Status   MRSA by PCR NEGATIVE NEGATIVE Final    Comment:        The GeneXpert MRSA Assay (FDA approved for NASAL specimens only), is one component of a comprehensive MRSA colonization surveillance program. It is not intended to diagnose MRSA infection nor to guide or monitor treatment for MRSA infections.   Culture, body fluid-bottle     Status: None (Preliminary result)   Collection Time: 11/17/14  9:03 AM  Result Value Ref Range Status   Specimen Description FLUID BILE  Final   Special Requests NONE  Final   Gram Stain   Final    GRAM VARIABLE ROD IN BOTH AEROBIC AND ANAEROBIC BOTTLES    Culture GRAM NEGATIVE RODS  Final   Report Status PENDING  Incomplete  Gram stain     Status: None    Collection Time: 11/17/14  9:03 AM  Result Value Ref Range Status   Specimen Description FLUID BILE  Final   Special Requests NONE  Final   Gram Stain NO WBC SEEN ABUNDANT GRAM VARIABLE ROD   Final   Report Status 11/17/2014  FINAL  Final     Studies: Ir Perc Cholecystostomy  11/17/2014   CLINICAL DATA:  Acute cholecystitis  EXAM: PERCUTANEOUS CHOLECYSTOSTOMY TUBE PLACEMENT WITH ULTRASOUND AND FLUOROSCOPIC GUIDANCE:  FLUOROSCOPY TIME:  36 seconds, 30 mGy  TECHNIQUE: The procedure, risks (including but not limited to bleeding, infection, organ damage ), benefits, and alternatives were explained to the patient. Questions regarding the procedure were encouraged and answered. The patient understands and consents to the procedure. Survey ultrasound of the abdomen was performed and an appropriate skin entry site was identified. Skin site was marked, prepped with Betadine, and draped in usual sterile fashion, and infiltrated locally with 1% lidocaine.  Intravenous Fentanyl and Versed were administered as conscious sedation during continuous cardiorespiratory monitoring by the radiology RN, with a total moderate sedation time of 10 minutes.  Under real-time ultrasound guidance, gallbladder was accessed using a transhepatic approach with a 21-gauge needle. Ultrasound image documentation was saved. Bile returned through the hub. Needle was exchanged over a 018 guidewire for transitional dilator which allowed placement of 035 J wire. Over this, a 10.2 French pigtail catheter was advanced and formed centrally in the gallbladder lumen. Small contrast injection confirmed appropriate position. Catheter secured externally with 0 Prolene suture and placed external drain bag. Patient tolerated the procedure well.  COMPLICATIONS: COMPLICATIONS none  IMPRESSION: 1. Technically successful percutaneous cholecystostomy tube placement with ultrasound and fluoroscopic guidance.   Electronically Signed   By: Corlis Leak M.D.   On:  11/17/2014 09:10    Scheduled Meds: . amLODipine  5 mg Oral Q1500  . amoxicillin-clavulanate  1 tablet Oral Q12H  . aspirin EC  81 mg Oral Daily  . diazepam  5 mg Oral Q1500  . folic acid  3 mg Oral Daily  . heparin  5,000 Units Subcutaneous 3 times per day  . levothyroxine  50 mcg Oral QAC breakfast  . metoprolol tartrate  12.5 mg Oral Daily  . pantoprazole  40 mg Oral Daily  . predniSONE  5 mg Oral QODAY   Continuous Infusions:    Time Spent: 25 min   Marinda Elk  [facility] Pager [phone]. If 7PM-7AM, please contact night-coverage at [email or web address], password [facility] 11/18/2014, 11:24 AM  LOS: 4 days

## 2014-11-19 LAB — CULTURE, BLOOD (ROUTINE X 2)
Culture: NO GROWTH
Culture: NO GROWTH

## 2014-11-20 ENCOUNTER — Encounter: Payer: Self-pay | Admitting: Internal Medicine

## 2014-11-20 ENCOUNTER — Non-Acute Institutional Stay (SKILLED_NURSING_FACILITY): Payer: Medicare Other | Admitting: Internal Medicine

## 2014-11-20 DIAGNOSIS — I509 Heart failure, unspecified: Secondary | ICD-10-CM | POA: Diagnosis not present

## 2014-11-20 DIAGNOSIS — D696 Thrombocytopenia, unspecified: Secondary | ICD-10-CM

## 2014-11-20 DIAGNOSIS — I5042 Chronic combined systolic (congestive) and diastolic (congestive) heart failure: Secondary | ICD-10-CM

## 2014-11-20 DIAGNOSIS — A419 Sepsis, unspecified organism: Secondary | ICD-10-CM

## 2014-11-20 DIAGNOSIS — M069 Rheumatoid arthritis, unspecified: Secondary | ICD-10-CM

## 2014-11-20 DIAGNOSIS — K810 Acute cholecystitis: Secondary | ICD-10-CM

## 2014-11-20 DIAGNOSIS — E871 Hypo-osmolality and hyponatremia: Secondary | ICD-10-CM | POA: Diagnosis not present

## 2014-11-20 DIAGNOSIS — I110 Hypertensive heart disease with heart failure: Secondary | ICD-10-CM | POA: Diagnosis not present

## 2014-11-20 DIAGNOSIS — D520 Dietary folate deficiency anemia: Secondary | ICD-10-CM | POA: Diagnosis not present

## 2014-11-20 NOTE — Progress Notes (Signed)
MRN: [id number] Name: Hunter Choi  Sex: male Age: 81 y.o. DOB: December 18, 1927  PSC #: Sonny Dandy Facility/Room: Level Of Care: SNF Provider: Merrilee Seashore D Emergency Contacts: Extended Emergency Contact Information Primary Emergency Contact: Julyan, Mckeag Address: [address]          [place], [place] [address] [place] Home Phone: [phone] Relation: Spouse Secondary Emergency Contact: Brennan,[name] Address: [address]          [place], [place] [address] [place] Mobile Phone: [phone] Relation: Son  Code Status:   Allergies: Ciprofloxacin  Chief Complaint  Patient presents with  . New Admit To SNF  Pt is 81 yo male with past medical history of essential hypertension, RA, AAA ,CAD with CABG and combined systolic and diastolic heart failure that comes into the hospital with complaining of fever nausea and vomiting and was diagnosed with acalculous cholecystitis.Pt was hospitalized from 8/19-23 with bilary sepsis where he was txed with IV abx and a percutaneous biliary drain was placed on 8/22. Hospital course was complicated by hyponatremia and thrombocytopenia, both resolved with tx of sepsis. Pt is admitted to SNF with generalized weakness for OT/PT and for care of his drain, which he will have for 4-6 weeks. While at SNF he will be followed for anemia, HTN, tx with norvasc, CHF ,treated with metoprolol and RA, tx with methotrexate and prednisone.   HPI: Patient is 81 y.o. male who  Past Medical History  Diagnosis Date  . Hypertension   . GERD (gastroesophageal reflux disease)   . Thyroid disease     HYPOTHYROIDISM  . Aortic stenosis, severe   . Embolism involving retinal artery   . BPH (benign prostatic hypertrophy)     Dr. Retta Diones  . Iliac artery aneurysm     Dr. Edilia Bo  . AAA (abdominal aortic aneurysm)     Dr. Ashley Royalty  . Diastolic heart failure   . S/P CABG x 4 01/26/1998    LIMA to LAD, SVG to D1, SVG to LCx, SVG to  RCA, open saphenous vein harvest via right lower extremity  . Coronary artery disease     Left main disease and severe 3-vessel CAD  . CAD (coronary artery disease) of bypass graft     Chronic occlusion of saphenous vein graft to RCA  . Hypothyroidism   . Shortness of breath   . Heart murmur   . Stroke     mini stroke effective his eye left  . Arthritis   . 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 Atrial fibrillation     Past Surgical History  Procedure Laterality Date  . Left hip hemiarthropasty      AFTER HIP FX  . Coronary artery bypass graft      1999..Dr. Sheliah Plane  . Total knee arthroplasty      right 1992   left  2002  . Biopsy prostate      2005  . Prostate surgery  2012    TURP  . Joint replacement  2007    THR  . Harvest bone graft  1982    FOR THE  ANKLE  . Skin grafts      1968  . Colonoscopy    . Toe surgery Bilateral   . Transcatheter aortic valve replacement, transapical N/A 01/15/2013    Procedure: TRANSCATHETER AORTIC VALVE REPLACEMENT, TRANSAPICAL;  Surgeon: Payton Doughty  Laneta Simmers, MD;  Location: [facility] OR;  Service: Open Heart Surgery;  Laterality: N/A;  . Intraoperative transesophageal echocardiogram N/A 01/15/2013    Procedure: INTRAOPERATIVE TRANSESOPHAGEAL ECHOCARDIOGRAM;  Surgeon: [name], MD;  Location: [facility] OR;  Service: Open Heart Surgery;  Laterality: N/A;  . Left and right heart catheterization with coronary angiogram N/A 12/20/2012    Procedure: LEFT AND RIGHT HEART CATHETERIZATION WITH CORONARY ANGIOGRAM;  Surgeon: Lesleigh Noe, MD;  Location: [facility] CATH LAB;  Service: Cardiovascular;  Laterality: N/A;  . Carotid endarterectomy Right Jan. 2, 1997    CE      Medication List       This list is accurate as of: 11/20/14 11:59 PM.  Always use your most recent med list.               amLODipine 5 MG tablet  Commonly known as:   NORVASC  Take 5 mg by mouth daily at 3 pm.     amoxicillin-clavulanate 875-125 MG per tablet  Commonly known as:  AUGMENTIN  Take 1 tablet by mouth every 12 (twelve) hours.     aspirin 81 MG EC tablet  Take 1 tablet (81 mg total) by mouth daily.     diazepam 5 MG tablet  Commonly known as:  VALIUM  Take 5 mg by mouth daily at 3 pm.     folic acid 1 MG tablet  Commonly known as:  FOLVITE  Take 3 mg by mouth daily.     HYDROcodone-acetaminophen 5-325 MG per tablet  Commonly known as:  NORCO/VICODIN  Take 1-2 tablets by mouth every 4 (four) hours as needed for moderate pain.     levothyroxine 50 MCG tablet  Commonly known as:  SYNTHROID, LEVOTHROID  Take 50 mcg by mouth daily.     methotrexate 2.5 MG tablet  Take 25 mg by mouth once a week. Every Thursday     metoprolol tartrate 25 MG tablet  Commonly known as:  LOPRESSOR  Take 1 tablet (25 mg total) by mouth daily.     omeprazole 20 MG capsule  Commonly known as:  PRILOSEC  Take 20 mg by mouth daily.     predniSONE 5 MG tablet  Commonly known as:  DELTASONE  Take 5 mg by mouth every other day.        No orders of the defined types were placed in this encounter.    Immunization History  Administered Date(s) Administered  . Influenza Whole 01/26/2010  . Influenza,inj,Quad PF,36+ Mos 01/18/2013, 02/21/2014  . Pneumococcal Polysaccharide-23 03/30/1998    Social History  Substance Use Topics  . Smoking status: Former Smoker -- 0.50 packs/day for 47 years    Types: Cigarettes    Quit date: 03/28/1958  . Smokeless tobacco: Former Neurosurgeon    Types: Chew     Comment: ONLY USED 2 YEARS  . Alcohol Use: No    Family history is  + HTN, HLD, HD  Review of Systems  DATA OBTAINED: from patient, nurse, mostly nurse GENERAL:  no fevers, fatigue, appetite changes SKIN: No itching, rash EYES: No eye pain, redness, discharge EARS: No earache, tinnitus, change in hearing NOSE: No congestion, drainage or bleeding   MOUTH/THROAT: No mouth or tooth pain, No sore throat RESPIRATORY: No cough, wheezing, SOB CARDIAC: No chest pain, palpitations, lower extremity edema  GI: No abdominal pain, No N/V/D or constipation, No heartburn or reflux , drain draining well GU: No dysuria, frequency or urgency, or incontinence  MUSCULOSKELETAL:  No unrelieved bone/joint pain NEUROLOGIC: No headache, dizziness or focal weakness PSYCHIATRIC: No c/o anxiety or sadness   Filed Vitals:   11/20/14 2116  BP: 147/64  Pulse: 68  Temp: 98.6 F (37 C)  Resp: 20    SpO2 Readings from Last 1 Encounters:  11/18/14 96%        Physical Exam  GENERAL APPEARANCE: Alert,  No acute distress.  SKIN: No diaphoresis rash HEAD: Normocephalic, atraumatic  EYES: Conjunctiva/lids clear. Pupils round, reactive. EOMs intact.  EARS: External exam WNL, canals clear. Hearing grossly normal.  NOSE: No deformity or discharge.  MOUTH/THROAT: Lips w/o lesions  RESPIRATORY: Breathing is even, unlabored. Lung sounds are clear   CARDIOVASCULAR: Heart RRR no murmurs, rubs or gallops. No peripheral edema.   GASTROINTESTINAL: Abdomen is soft, non-tender, not distended w/ normal bowel sounds; drain in place with clear brown d/c.. GENITOURINARY: Bladder non tender, not distended  MUSCULOSKELETAL: No abnormal joints or musculature NEUROLOGIC:  Cranial nerves 2-12 grossly intact. Moves all extremities  PSYCHIATRIC: dementia, no behavioral issues  Patient Active Problem List   Diagnosis Date Noted  . Hyponatremia 11/21/2014  . Cholecystitis   . Blood poisoning   . Sepsis 11/14/2014  . Elevated troponin I level 11/14/2014  . Urinary tract infection 11/14/2014  . Cholecystitis, acute 11/14/2014  . Lactic acidosis 11/14/2014  . Elevated LFTs 11/14/2014  . Anemia 05/23/2014  . Microscopic hematuria 05/23/2014  . Enterococcus UTI 02/22/2014  . Thrombocytopenia 02/21/2014  . Malnutrition of moderate degree 02/21/2014  . Systolic and  diastolic CHF, chronic   . Arterial hypotension   . Acute kidney injury 02/19/2014  . Occlusion and stenosis of carotid artery without mention of cerebral infarction 09/04/2013  . Aftercare following surgery of the circulatory system, NEC 09/04/2013  . AAA (abdominal aortic aneurysm) without rupture 03/06/2013  . 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 Coronary artery disease   . Hypertensive heart disease with CHF   . GERD (gastroesophageal reflux disease)   . Thyroid disease   . Aortic stenosis, severe   . Embolism involving retinal artery   . BPH (benign prostatic hypertrophy)   . Iliac artery aneurysm   . AAA (abdominal aortic aneurysm)   . Diastolic heart failure   . Rheumatoid arthritis 04/22/2010  . DYSPNEA ON EXERTION 04/22/2010  . PULMONARY FUNCTION TESTS, ABNORMAL 04/22/2010    CBC    Component Value Date/Time   WBC 6.3 11/18/2014 0716   RBC 3.47* 11/18/2014 0716   RBC 3.42* 05/24/2014 0614   HGB 9.9* 11/18/2014 0716   HCT 30.4* 11/18/2014 0716   PLT 127* 11/18/2014 0716   MCV 87.6 11/18/2014 0716   LYMPHSABS 0.4* 11/14/2014 1730   MONOABS 1.0 11/14/2014 1730   EOSABS 0.0 11/14/2014 1730   BASOSABS 0.0 11/14/2014 1730    CMP     Component Value Date/Time   NA 126* 11/18/2014 0716   K 4.0 11/18/2014 0716   CL 96* 11/18/2014 0716   CO2 23 11/18/2014 0716   GLUCOSE 88 11/18/2014 0716   BUN 6 11/18/2014 0716   CREATININE 0.92 11/18/2014 0716   CALCIUM 7.8* 11/18/2014 0716   PROT 5.8* 11/17/2014 1256   ALBUMIN 2.9* 11/17/2014 1256   AST 70* 11/17/2014 1256   ALT 80* 11/17/2014 1256   ALKPHOS 291* 11/17/2014 1256   BILITOT 1.2 11/17/2014 1256   GFRNONAA >60 11/18/2014 0716   GFRAA >60 11/18/2014 0716    Lab Results  Component Value Date   HGBA1C  6.0* 01/11/2013     Dg Chest 2 View  11/14/2014   CLINICAL DATA:  Shortness of breath and low grade fever this evening.  EXAM: CHEST  2 VIEW  COMPARISON:  February 19, 2014   FINDINGS: The heart size and mediastinal contours are stable. Heart size is enlarged. Patient is status post prior median sternotomy, CABG, transcatheter aortic valve replacement. There is no focal infiltrate, pulmonary edema, or pleural effusion. The visualized skeletal structures are stable.  IMPRESSION: No active cardiopulmonary disease.   Electronically Signed   By: Sherian Rein M.D.   On: 11/14/2014 19:14   Nm Hepatobiliary Liver Func  11/15/2014   CLINICAL DATA:  Cholecystitis on prior CT.  EXAM: NUCLEAR MEDICINE HEPATOBILIARY IMAGING  TECHNIQUE: Sequential images of the abdomen were obtained out to 60 minutes following intravenous administration of radiopharmaceutical.  RADIOPHARMACEUTICALS:  5.15 MCi Tc-43m  Choletec IV  COMPARISON:  CT 11/14/2014.  FINDINGS: There is prompt hepatic uptake of radiotracer. Excretion of contrast into the common bile duct is visible as expected at between 10 and 15 minutes. Normal spill of radiotracer into the duodenum.  There was nonvisualization of the gallbladder after 1 hour. The study was continued with administration of IV morphine. Continued imaging failed to demonstrate the gallbladder. The findings are compatible with acute cholecystitis without common bile duct obstruction.  Liver washout is relatively poor, suggesting an element of hepatic failure.  IMPRESSION: Nonvisualization of the gallbladder compatible with acute cholecystitis. Poor washout of radiotracer in the liver suggests an element of hepatic dysfunction or partial obstruction of the common bile duct.   Electronically Signed   By: Andreas Newport M.D.   On: 11/15/2014 12:30   Ct Abdomen Pelvis W Contrast  11/14/2014   CLINICAL DATA:  Fever and abdominal pain.  Initial encounter.  EXAM: CT ABDOMEN AND PELVIS WITH CONTRAST  TECHNIQUE: Multidetector CT imaging of the abdomen and pelvis was performed using the standard protocol following bolus administration of intravenous contrast.  CONTRAST:   OMNIPAQUE IOHEXOL 300 MG/ML  SOLN  COMPARISON:  05/23/2014 CTA.  FINDINGS: Musculoskeletal: No aggressive osseous lesions. Severe degenerative disc disease. LEFT hip hemiarthroplasty. RIGHT hip osteoarthritis.  Lung Bases: Atelectasis.  Liver:  Mild within normal limits.  Spleen:  Normal.  Gallbladder: Distended. There is pericholecystic fluid in the gallbladder fossa. No calcified stones are identified.  Common bile duct: Common bile duct appears within normal limits. No calcified common duct stone identified.  Pancreas:  Atrophic.  No mass lesion or inflammatory change.  Adrenal glands:  Normal bilaterally.  Kidneys: Renal scarring is present. Artifactual decreased enhancement in the RIGHT inferior renal pole from extremities. LEFT renal scarring. Excretion of contrast appears normal bilaterally.  Stomach: Hyper trophic appearance of the stomach proximally, suggesting hypertrophic gastritis. No change from prior.  Small bowel:  No small bowel obstruction or inflammatory change.  Colon: Large stool burden. Colonic diverticulosis. No diverticulitis.  Pelvic Genitourinary: Thickening of the urinary bladder with cellules along the LEFT lateral wall. Bladder is partially obscured by artifact from LEFT hip arthroplasty.  Peritoneum: No free air.  Vascular/lymphatic: Aortoiliac aneurysms bilaterally appear similar to the prior exam. No rupture or acute vascular abnormality. No retroperitoneal hematoma/ hemorrhage. No extravasation of contrast.  Body Wall: Fat containing subxiphoid hernia.  IMPRESSION: 1. Pericholecystic fluid is new since the prior exam and is potentially associated with acute cholecystitis. Correlate with laboratory parameters and consider a RIGHT upper quadrant ultrasound if warranted. 2. Unchanged iliofemoral aneurysms. 3. Bladder wall  thickening and cellules suggesting chronic bladder outflow obstruction. Recommend correlation with urinalysis for cystitis.   Electronically Signed   By: Andreas Newport M.D.   On: 11/14/2014 19:32    Not all labs, radiology exams or other studies done during hospitalization come through on my EPIC note; however they are reviewed by me.    Assessment and Plan  Cholecystitis, acute Sepsis due to acute cholecystitis: Started empirically on Zosyn on admission surgery was consulted who recommended a percutaneous drain. After 2 days of treatment he defervesced, culture of bile was sent, surgery recommended to change to Augmentin which we will continue for 7 days. He is status post biliary percutaneous drain on 11/17/2014; SNF plan - augmentin for 7 days ,drain care and monitoring  Sepsis 2/2 acute cholecystitis; tx with zosyn and biliary drain with resolution; Plan - drain for 4-6 weeks  Thrombocytopenia 2/2 sepsis; Plan - still thrombocytopenic on d/c ;PLT 127; CBC to follow  Hyponatremia Still present at d/c with a Na+ 126; SNF plan - will monitor with BMP  Anemia Present at d/c Hb 9.9; Plan - CBC to follow; add iron daily  Rheumatoid arthritis Inflammatory arthritis; SNF plan - cont methotraexate weekly and prednisone 5mg  QOD  Hypertensive heart disease with CHF Stable ; SNF plan - continue norvasc 5 mg and metoprolol 25 mg daily    Margit Hanks, MD

## 2014-11-21 DIAGNOSIS — E871 Hypo-osmolality and hyponatremia: Secondary | ICD-10-CM | POA: Insufficient documentation

## 2014-11-21 NOTE — Assessment & Plan Note (Signed)
Sepsis due to acute cholecystitis: Started empirically on Zosyn on admission surgery was consulted who recommended a percutaneous drain. After 2 days of treatment he defervesced, culture of bile was sent, surgery recommended to change to Augmentin which we will continue for 7 days. He is status post biliary percutaneous drain on 11/17/2014; SNF plan - augmentin for 7 days ,drain care and monitoring

## 2014-11-21 NOTE — Assessment & Plan Note (Signed)
2/2 sepsis; Plan - still thrombocytopenic on d/c ;PLT 127; CBC to follow

## 2014-11-21 NOTE — Assessment & Plan Note (Signed)
Stable; Plan - cont metoprolol 25 mg daily

## 2014-11-21 NOTE — Assessment & Plan Note (Signed)
Present at d/c Hb 9.9; Plan - CBC to follow; add iron daily

## 2014-11-21 NOTE — Assessment & Plan Note (Signed)
Still present at d/c with a Na+ 126; SNF plan - will monitor with BMP

## 2014-11-21 NOTE — Assessment & Plan Note (Addendum)
2/2 acute cholecystitis; tx with zosyn and biliary drain with resolution; Plan - drain for 4-6 weeks

## 2014-11-21 NOTE — Assessment & Plan Note (Signed)
Inflammatory arthritis; SNF plan - cont methotraexate weekly and prednisone 5mg  QOD

## 2014-11-21 NOTE — Assessment & Plan Note (Signed)
Stable ; SNF plan - continue norvasc 5 mg and metoprolol 25 mg daily

## 2014-11-24 LAB — CULTURE, BODY FLUID-BOTTLE

## 2014-11-24 LAB — CULTURE, BODY FLUID W GRAM STAIN -BOTTLE

## 2014-12-05 ENCOUNTER — Non-Acute Institutional Stay (SKILLED_NURSING_FACILITY): Payer: Medicare Other | Admitting: Nurse Practitioner

## 2014-12-05 DIAGNOSIS — I110 Hypertensive heart disease with heart failure: Secondary | ICD-10-CM

## 2014-12-05 DIAGNOSIS — M069 Rheumatoid arthritis, unspecified: Secondary | ICD-10-CM

## 2014-12-05 DIAGNOSIS — I5042 Chronic combined systolic (congestive) and diastolic (congestive) heart failure: Secondary | ICD-10-CM

## 2014-12-05 DIAGNOSIS — E871 Hypo-osmolality and hyponatremia: Secondary | ICD-10-CM | POA: Diagnosis not present

## 2014-12-05 DIAGNOSIS — K810 Acute cholecystitis: Secondary | ICD-10-CM | POA: Diagnosis not present

## 2014-12-05 DIAGNOSIS — I509 Heart failure, unspecified: Secondary | ICD-10-CM | POA: Diagnosis not present

## 2014-12-05 NOTE — Progress Notes (Signed)
Patient ID: Hunter Choi, male   DOB: June 22, 1927, 81 y.o.   MRN: [id number]    Nursing Home Location:  [facility]   Place of Service: SNF (31)  PCP: Kristie Cowman, MD  Allergies  Allergen Reactions  . Ciprofloxacin Other (See Comments)    REACTION: mild delirium    Chief Complaint  Patient presents with  . Discharge Note    HPI:  Patient is a 81 y.o. male seen today at [facility] for discharge home. Pt with a past medical history of essential hypertension, RA, AAA ,CAD with CABG and combined systolic and diastolic heart failure. Pt is at [facility] for rehab after hospitalization from 8/19-8/23 with bilary sepsis where he was txed with IV abx and a percutaneous biliary drain was placed on 8/22. Hospital course was complicated by hyponatremia and thrombocytopenia, both resolved with tx of sepsis. Pt remains with bilary drain. Patient currently doing well with therapy, now stable to discharge home with home health.  Review of Systems:  Review of Systems  Constitutional: Negative for activity change, appetite change, fatigue and unexpected weight change.  HENT: Negative for congestion and hearing loss.   Eyes: Negative.   Respiratory: Negative for cough and shortness of breath.   Cardiovascular: Negative for chest pain, palpitations and leg swelling.  Gastrointestinal: Negative for abdominal pain, diarrhea and constipation.  Genitourinary: Negative for dysuria and difficulty urinating.  Musculoskeletal: Negative for myalgias and arthralgias.  Skin: Negative for color change and wound.  Neurological: Negative for dizziness and weakness.  Psychiatric/Behavioral: Negative for behavioral problems, confusion and agitation.    Past Medical History  Diagnosis Date  . Hypertension   . GERD (gastroesophageal reflux disease)   . Thyroid disease     HYPOTHYROIDISM  . Aortic stenosis, severe   . Embolism involving retinal artery   . BPH (benign  prostatic hypertrophy)     Dr. Retta Diones  . Iliac artery aneurysm     Dr. Edilia Bo  . AAA (abdominal aortic aneurysm)     Dr. Ashley Royalty  . Diastolic heart failure   . 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 Coronary artery disease     Left main disease and severe 3-vessel CAD  . CAD (coronary artery disease) of bypass graft     Chronic occlusion of saphenous vein graft to RCA  . Hypothyroidism   . Shortness of breath   . Heart murmur   . Stroke     mini stroke effective his eye left  . Arthritis   . 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 Atrial fibrillation    Past Surgical History  Procedure Laterality Date  . Left hip hemiarthropasty      AFTER HIP FX  . Coronary artery bypass graft      1999..Dr. Sheliah Plane  . Total knee arthroplasty      right 1992   left  2002  . Biopsy prostate      2005  . Prostate surgery  2012    TURP  . Joint replacement  2007    THR  . Harvest bone graft  1982    FOR THE  ANKLE  . Skin grafts      1968  . Colonoscopy    .  Toe surgery Bilateral   . Transcatheter aortic valve replacement, transapical N/A 01/15/2013    Procedure: TRANSCATHETER AORTIC VALVE REPLACEMENT, TRANSAPICAL;  Surgeon: [name], MD;  Location: [facility] OR;  Service: Open Heart Surgery;  Laterality: N/A;  . Intraoperative transesophageal echocardiogram N/A 01/15/2013    Procedure: INTRAOPERATIVE TRANSESOPHAGEAL ECHOCARDIOGRAM;  Surgeon: [name], MD;  Location: [facility] OR;  Service: Open Heart Surgery;  Laterality: N/A;  . Left and right heart catheterization with coronary angiogram N/A 12/20/2012    Procedure: LEFT AND RIGHT HEART CATHETERIZATION WITH CORONARY ANGIOGRAM;  Surgeon: Lesleigh Noe, MD;  Location: [facility] CATH LAB;  Service: Cardiovascular;  Laterality: N/A;   . Carotid endarterectomy Right Jan. 2, 1997    CE   Social History:   reports that he quit smoking about 56 years ago. His smoking use included Cigarettes. He has a 23.5 pack-year smoking history. He has quit using smokeless tobacco. His smokeless tobacco use included Chew. He reports that he does not drink alcohol or use illicit drugs.  Family History  Problem Relation Age of Onset  . Heart attack Mother   . Heart disease Mother     Before age 10  . Hyperlipidemia Mother   . Hypertension Mother   . Hypertension Son   . Heart disease Father     AAA-   . Hyperlipidemia Father   . Heart attack Father   . Hypertension Father   . Heart disease Brother     Before age 68  . Hyperlipidemia Brother   . Heart attack Brother   . Hypertension Brother     Medications: Patient's Medications  New Prescriptions   No medications on file  Previous Medications   AMLODIPINE (NORVASC) 5 MG TABLET    Take 5 mg by mouth daily at 3 pm.   ASPIRIN EC 81 MG EC TABLET    Take 1 tablet (81 mg total) by mouth daily.   DIAZEPAM (VALIUM) 5 MG TABLET    Take 5 mg by mouth daily at 3 pm.    FOLIC ACID (FOLVITE) 1 MG TABLET    Take 3 mg by mouth daily.   HYDROCODONE-ACETAMINOPHEN (NORCO/VICODIN) 5-325 MG PER TABLET    Take 1-2 tablets by mouth every 4 (four) hours as needed for moderate pain.   LEVOTHYROXINE (SYNTHROID, LEVOTHROID) 50 MCG TABLET    Take 50 mcg by mouth daily.   METHOTREXATE 2.5 MG TABLET    Take 25 mg by mouth once a week. Every Thursday   METOPROLOL TARTRATE (LOPRESSOR) 25 MG TABLET    Take 1 tablet (25 mg total) by mouth daily.   OMEPRAZOLE (PRILOSEC) 20 MG CAPSULE    Take 20 mg by mouth daily.   PREDNISONE (DELTASONE) 5 MG TABLET    Take 5 mg by mouth every other day.   Modified Medications   No medications on file  Discontinued Medications   AMOXICILLIN-CLAVULANATE (AUGMENTIN) 875-125 MG PER TABLET    Take 1 tablet by mouth every 12 (twelve) hours.     Physical Exam: Filed  Vitals:   12/05/14 1358  BP: 149/69  Pulse: 64  Temp: 98.6 F (37 C)  Resp: 18    Physical Exam  Constitutional: No distress.  Frail elderly male, NAD  HENT:  Head: Normocephalic and atraumatic.  Mouth/Throat: Oropharynx is clear and moist. No oropharyngeal exudate.  Eyes: Conjunctivae and EOM are normal. Pupils are equal, round, and reactive to light.  Neck: Normal range of motion. Neck supple.  Cardiovascular: Normal rate, regular rhythm and normal heart sounds.   Pulmonary/Chest: Effort normal and breath sounds normal.  Abdominal: Soft. Bowel sounds are normal.  drain in place with clear brown drainage.  Musculoskeletal: He exhibits no edema or tenderness.  Neurological: He is alert.  Memory loss   Skin: Skin is warm and dry. He is not diaphoretic.  Psychiatric: He has a normal mood and affect.    Labs reviewed: Basic Metabolic Panel:  Recent Labs  25/85/27 0210  11/15/14 0329  11/16/14 1045 11/17/14 0503 11/18/14 0716  NA 134*  < >  --   < > 128* 125* 126*  K 4.2  < >  --   < > 3.8 4.0 4.0  CL 100  < >  --   < > 100* 94* 96*  CO2 23  < >  --   < > 19* 22 23  GLUCOSE 89  < >  --   < > 172* 92 88  BUN 10  < >  --   < > 15 12 6   CREATININE 0.71  < >  --   < > 1.21 1.02 0.92  CALCIUM 7.5*  < >  --   < > 7.9* 7.7* 7.8*  MG 1.5  --  1.6*  --  2.1  --   --   PHOS  --   --  3.2  --   --   --   --   < > = values in this interval not displayed. Liver Function Tests:  Recent Labs  11/14/14 1730 11/15/14 0624 11/17/14 1256  AST 412* 281* 70*  ALT 164* 182* 80*  ALKPHOS 410* 385* 291*  BILITOT 3.0* 2.7* 1.2  PROT 5.7* 5.8* 5.8*  ALBUMIN 3.3* 3.0* 2.9*    Recent Labs  05/23/14 1729 11/14/14 1730  LIPASE 19 43   No results for input(s): AMMONIA in the last 8760 hours. CBC:  Recent Labs  02/21/14 0210  05/23/14 1729  11/14/14 1730 11/15/14 0329 11/18/14 0716  WBC 6.5  < > 13.4*  < > 10.5 10.6* 6.3  NEUTROABS 3.5  --  8.9*  --  9.1*  --   --   HGB  9.5*  < > 11.7*  < > 10.2* 10.4* 9.9*  HCT 29.7*  < > 36.7*  < > 31.0* 31.8* 30.4*  MCV 95.8  < > 91.5  < > 89.6 89.1 87.6  PLT 101*  < > 148*  < > 54* 101* 127*  < > = values in this interval not displayed. TSH:  Recent Labs  05/24/14 0605  TSH 1.497   A1C: Lab Results  Component Value Date   HGBA1C 6.0* 01/11/2013   Lipid Panel:  Recent Labs  02/21/14 0210  CHOL 84  HDL 27*  LDLCALC 38  TRIG 93  CHOLHDL 3.1    Radiological Exams: Dg Chest 2 View  11/14/2014   CLINICAL DATA:  Shortness of breath and low grade fever this evening.  EXAM: CHEST  2 VIEW  COMPARISON:  February 19, 2014  FINDINGS: The heart size and mediastinal contours are stable. Heart size is enlarged. Patient is status post prior median sternotomy, CABG, transcatheter aortic valve replacement. There is no focal infiltrate, pulmonary edema, or pleural effusion. The visualized skeletal structures are stable.  IMPRESSION: No active cardiopulmonary disease.   Electronically Signed   By: February 21, 2014 M.D.   On: 11/14/2014 19:14   Nm Hepatobiliary Liver Func  11/15/2014  CLINICAL DATA:  Cholecystitis on prior CT.  EXAM: NUCLEAR MEDICINE HEPATOBILIARY IMAGING  TECHNIQUE: Sequential images of the abdomen were obtained out to 60 minutes following intravenous administration of radiopharmaceutical.  RADIOPHARMACEUTICALS:  5.15 MCi Tc-28m  Choletec IV  COMPARISON:  CT 11/14/2014.  FINDINGS: There is prompt hepatic uptake of radiotracer. Excretion of contrast into the common bile duct is visible as expected at between 10 and 15 minutes. Normal spill of radiotracer into the duodenum.  There was nonvisualization of the gallbladder after 1 hour. The study was continued with administration of IV morphine. Continued imaging failed to demonstrate the gallbladder. The findings are compatible with acute cholecystitis without common bile duct obstruction.  Liver washout is relatively poor, suggesting an element of hepatic failure.   IMPRESSION: Nonvisualization of the gallbladder compatible with acute cholecystitis. Poor washout of radiotracer in the liver suggests an element of hepatic dysfunction or partial obstruction of the common bile duct.   Electronically Signed   By: Andreas Newport M.D.   On: 11/15/2014 12:30   Ct Abdomen Pelvis W Contrast  11/14/2014   CLINICAL DATA:  Fever and abdominal pain.  Initial encounter.  EXAM: CT ABDOMEN AND PELVIS WITH CONTRAST  TECHNIQUE: Multidetector CT imaging of the abdomen and pelvis was performed using the standard protocol following bolus administration of intravenous contrast.  CONTRAST:  OMNIPAQUE IOHEXOL 300 MG/ML  SOLN  COMPARISON:  05/23/2014 CTA.  FINDINGS: Musculoskeletal: No aggressive osseous lesions. Severe degenerative disc disease. LEFT hip hemiarthroplasty. RIGHT hip osteoarthritis.  Lung Bases: Atelectasis.  Liver:  Mild within normal limits.  Spleen:  Normal.  Gallbladder: Distended. There is pericholecystic fluid in the gallbladder fossa. No calcified stones are identified.  Common bile duct: Common bile duct appears within normal limits. No calcified common duct stone identified.  Pancreas:  Atrophic.  No mass lesion or inflammatory change.  Adrenal glands:  Normal bilaterally.  Kidneys: Renal scarring is present. Artifactual decreased enhancement in the RIGHT inferior renal pole from extremities. LEFT renal scarring. Excretion of contrast appears normal bilaterally.  Stomach: Hyper trophic appearance of the stomach proximally, suggesting hypertrophic gastritis. No change from prior.  Small bowel:  No small bowel obstruction or inflammatory change.  Colon: Large stool burden. Colonic diverticulosis. No diverticulitis.  Pelvic Genitourinary: Thickening of the urinary bladder with cellules along the LEFT lateral wall. Bladder is partially obscured by artifact from LEFT hip arthroplasty.  Peritoneum: No free air.  Vascular/lymphatic: Aortoiliac aneurysms bilaterally appear  similar to the prior exam. No rupture or acute vascular abnormality. No retroperitoneal hematoma/ hemorrhage. No extravasation of contrast.  Body Wall: Fat containing subxiphoid hernia.  IMPRESSION: 1. Pericholecystic fluid is new since the prior exam and is potentially associated with acute cholecystitis. Correlate with laboratory parameters and consider a RIGHT upper quadrant ultrasound if warranted. 2. Unchanged iliofemoral aneurysms. 3. Bladder wall thickening and cellules suggesting chronic bladder outflow obstruction. Recommend correlation with urinalysis for cystitis.   Electronically Signed   By: Andreas Newport M.D.   On: 11/14/2014 19:32    Assessment/Plan 1. Cholecystitis, acute S/p perc drain and antibiotic treatment, completed augmentin for 7 days in SNF. -cont drain at discharge, home health to follow up, nursing to educate family on home care.    2. Hyponatremia Remains stable, Na of 125, will need ongoing outpt follow up  3. Hypertensive heart disease with CHF Stable, conts on norvasc 5 mg and metoprolol 25 mg daily  4. Hypothyroidism -TSH of 5.34, conts on synthroid 50 mcg  5. Rheumatoid arthritis Inflammatory arthritis,  cont methotraexate weekly and prednisone 5mg  QOD  6. Anemia Hgb at discharge 9.9; now 10.1, will need ongoing follow up by PCP  pt is stable for discharge-will need PT/OT/Nursing per home health. DME needed: rolling walker, 3N1, and tub seat. Rx written.  will need to follow up with PCP within 2 weeks.    . Janene Harvey  [facility] [phone] 8 am - 5 pm) [phone] (after hours)

## 2014-12-25 ENCOUNTER — Other Ambulatory Visit: Payer: Self-pay | Admitting: General Surgery

## 2014-12-25 DIAGNOSIS — K8020 Calculus of gallbladder without cholecystitis without obstruction: Secondary | ICD-10-CM

## 2014-12-31 ENCOUNTER — Other Ambulatory Visit: Payer: Self-pay | Admitting: *Deleted

## 2014-12-31 DIAGNOSIS — Z953 Presence of xenogenic heart valve: Secondary | ICD-10-CM

## 2014-12-31 DIAGNOSIS — I350 Nonrheumatic aortic (valve) stenosis: Secondary | ICD-10-CM

## 2015-01-01 ENCOUNTER — Other Ambulatory Visit: Payer: Self-pay | Admitting: Internal Medicine

## 2015-01-01 ENCOUNTER — Ambulatory Visit
Admission: RE | Admit: 2015-01-01 | Discharge: 2015-01-01 | Disposition: A | Discharge status: Home / Self Care | Payer: Medicare Other | Source: Ambulatory Visit | Attending: Interventional Radiology | Admitting: Interventional Radiology

## 2015-01-01 ENCOUNTER — Ambulatory Visit
Admission: RE | Admit: 2015-01-01 | Discharge: 2015-01-01 | Disposition: A | Discharge status: Home / Self Care | Payer: Medicare Other | Source: Ambulatory Visit | Attending: General Surgery | Admitting: General Surgery

## 2015-01-01 DIAGNOSIS — K8020 Calculus of gallbladder without cholecystitis without obstruction: Secondary | ICD-10-CM | POA: Insufficient documentation

## 2015-01-01 NOTE — Progress Notes (Signed)
Patient ID: Hunter Choi, male   DOB: 1927-08-06, 81 y.o.   MRN: [id number]       Chief Complaint: Cholecystitis requiring percutaneous cholecystostomy  Referring Physician(s): Gaynelle Adu  History of Present Illness: Hunter Choi is a 81 y.o. male who presents for outpatient follow-up of a percutaneous cholecystostomy placed approximately 2 months ago. He is now asymptomatic. No current right upper quadrant abdominal pain. No flank pain or fevers. Minimal clear bilious output within the drain bag.  Past Medical History  Diagnosis Date  . Hypertension   . GERD (gastroesophageal reflux disease)   . Thyroid disease     HYPOTHYROIDISM  . Aortic stenosis, severe   . Embolism involving retinal artery   . BPH (benign prostatic hypertrophy)     Dr. Retta Diones  . Iliac artery aneurysm     Dr. Edilia Bo  . AAA (abdominal aortic aneurysm)     Dr. Ashley Royalty  . Diastolic heart failure   . 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 Coronary artery disease     Left main disease and severe 3-vessel CAD  . CAD (coronary artery disease) of bypass graft     Chronic occlusion of saphenous vein graft to RCA  . Hypothyroidism   . Shortness of breath   . Heart murmur   . Stroke     mini stroke effective his eye left  . Arthritis   . 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 Atrial fibrillation     Past Surgical History  Procedure Laterality Date  . Left hip hemiarthropasty      AFTER HIP FX  . Coronary artery bypass graft      1999..Dr. Sheliah Plane  . Total knee arthroplasty      right 1992   left  2002  . Biopsy prostate      2005  . Prostate surgery  2012    TURP  . Joint replacement  2007    THR  . Harvest bone graft  1982    FOR THE  ANKLE  . Skin grafts      1968  .  Colonoscopy    . Toe surgery Bilateral   . Transcatheter aortic valve replacement, transapical N/A 01/15/2013    Procedure: TRANSCATHETER AORTIC VALVE REPLACEMENT, TRANSAPICAL;  Surgeon: [name], MD;  Location: [facility] OR;  Service: Open Heart Surgery;  Laterality: N/A;  . Intraoperative transesophageal echocardiogram N/A 01/15/2013    Procedure: INTRAOPERATIVE TRANSESOPHAGEAL ECHOCARDIOGRAM;  Surgeon: [name], MD;  Location: [facility] OR;  Service: Open Heart Surgery;  Laterality: N/A;  . Left and right heart catheterization with coronary angiogram N/A 12/20/2012    Procedure: LEFT AND RIGHT HEART CATHETERIZATION WITH CORONARY ANGIOGRAM;  Surgeon: Lesleigh Noe, MD;  Location: [facility] CATH LAB;  Service: Cardiovascular;  Laterality: N/A;  . Carotid endarterectomy Right Jan. 2, 1997    CE    Allergies: Ciprofloxacin  Medications: Prior to Admission medications   Medication Sig Start Date End Date Taking? Authorizing Provider  amLODipine (NORVASC) 5 MG tablet Take 5 mg by mouth daily at 3 pm. 11/11/14   Historical Provider, MD  aspirin EC 81 MG EC tablet Take 1 tablet (81 mg total) by mouth daily. 01/21/13   Donielle M  Joycelyn Man, PA-C  diazepam (VALIUM) 5 MG tablet Take 5 mg by mouth daily at 3 pm.     Historical Provider, MD  folic acid (FOLVITE) 1 MG tablet Take 3 mg by mouth daily.    Historical Provider, MD  HYDROcodone-acetaminophen (NORCO/VICODIN) 5-325 MG per tablet Take 1-2 tablets by mouth every 4 (four) hours as needed for moderate pain. 11/18/14   Marinda Elk, MD  levothyroxine (SYNTHROID, LEVOTHROID) 50 MCG tablet Take 50 mcg by mouth daily. 11/06/14   Historical Provider, MD  methotrexate 2.5 MG tablet Take 25 mg by mouth once a week. Every Thursday    Historical Provider, MD  metoprolol tartrate (LOPRESSOR) 25 MG tablet Take 1 tablet (25 mg total) by mouth daily. 01/21/13   Donielle Margaretann Loveless, PA-C  omeprazole (PRILOSEC) 20 MG capsule Take 20 mg by mouth daily.     Historical Provider, MD  predniSONE (DELTASONE) 5 MG tablet Take 5 mg by mouth every other day.     Historical Provider, MD     Family History  Problem Relation Age of Onset  . Heart attack Mother   . Heart disease Mother     Before age 55  . Hyperlipidemia Mother   . Hypertension Mother   . Hypertension Son   . Heart disease Father     AAA-   . Hyperlipidemia Father   . Heart attack Father   . Hypertension Father   . Heart disease Brother     Before age 60  . Hyperlipidemia Brother   . Heart attack Brother   . Hypertension Brother     Social History   Social History  . Marital Status: Married    Spouse Name: N/A  . Number of Children: N/A  . Years of Education: N/A   Social History Main Topics  . Smoking status: Former Smoker -- 0.50 packs/day for 47 years    Types: Cigarettes    Quit date: 03/28/1958  . Smokeless tobacco: Former Neurosurgeon    Types: Chew     Comment: ONLY USED 2 YEARS  . Alcohol Use: No  . Drug Use: No  . Sexual Activity: Not on file   Other Topics Concern  . Not on file   Social History Narrative    Review of Systems: A 12 point ROS discussed and pertinent positives are indicated in the HPI above.  All other systems are negative.  Review of Systems  Constitutional: Negative for fever, diaphoresis, activity change, appetite change and fatigue.  Respiratory: Negative for chest tightness and shortness of breath.   Cardiovascular: Negative for chest pain.  Gastrointestinal: Negative for nausea, vomiting, abdominal pain and abdominal distention.    Vital Signs: BP 115/54 mmHg  Pulse 57  Temp(Src) 98.2 F (36.8 C) (Oral)  SpO2 99%  Physical Exam  Constitutional:  Frail elderly male in no distress.  Abdominal: Soft. Bowel sounds are normal. He exhibits no distension and no mass. There is no tenderness. There is no rebound and no guarding.  Cholecystostomy site is clean, dry and intact. No signs of infection. Minimal bilious output in the  drain bag.     Imaging: US Abdomen Limited Ruq  01/01/2015   CLINICAL DATA:  Status post percutaneous cholecystostomy in August 2016 for pericholecystic fluid and cholecystitis.  EXAM: US ABDOMEN LIMITED - RIGHT UPPER QUADRANT  COMPARISON:  Abdominal pelvic CT scan of November 14, 2014 and radiographs from the percutaneous cholecystostomy procedure on the 22nd August 2016.  FINDINGS: Gallbladder:  The  gallbladder is contracted. A few intraluminal echoes are observed. There is no pericholecystic fluid. The gallbladder wall where visualized does not appear thickened. The drainage tube within the gallbladder lumen is visible.  Common bile duct:  Diameter: 4.6 mm  Liver:  The echotexture is heterogeneous and mildly increased consistent with fatty infiltration.  IMPRESSION: 1. No residual pericholecystic fluid. Small amount of sludge within the lumen is suspected. The cholecystostomy drainage tube is visible. There is no sonographic Murphy's sign. 2. Fatty infiltrative change of the liver.   Electronically Signed   By: David  [place] M.D.   On: 01/01/2015 10:11    Labs:  CBC:  Recent Labs  05/25/14 0515 11/14/14 1730 11/15/14 0329 11/18/14 0716  WBC 7.4 10.5 10.6* 6.3  HGB 10.6* 10.2* 10.4* 9.9*  HCT 32.2* 31.0* 31.8* 30.4*  PLT 113* 54* 101* 127*    COAGS:  Recent Labs  11/16/14 1045  INR 1.30    BMP:  Recent Labs  11/15/14 0624 11/16/14 1045 11/17/14 0503 11/18/14 0716  NA 131* 128* 125* 126*  K 3.9 3.8 4.0 4.0  CL 100* 100* 94* 96*  CO2 22 19* 22 23  GLUCOSE 135* 172* 92 88  BUN 14 15 12 6   CALCIUM 8.0* 7.9* 7.7* 7.8*  CREATININE 1.10 1.21 1.02 0.92  GFRNONAA 59* 53* >60 >60  GFRAA >60 >60 >60 >60    LIVER FUNCTION TESTS:  Recent Labs  05/25/14 0515 11/14/14 1730 11/15/14 0624 11/17/14 1256  BILITOT 0.8 3.0* 2.7* 1.2  AST 20 412* 281* 70*  ALT 9 164* 182* 80*  ALKPHOS 48 410* 385* 291*  PROT 5.5* 5.7* 5.8* 5.8*  ALBUMIN 2.9* 3.3* 3.0* 2.9*    Assessment  and Plan:  2 months status post percutaneous cholecystostomy for acute cholecystitis. He currently is asymptomatic. No abdominal pain or fever. Very little output from the catheter. Cholangiogram through the existing catheter confirms cystic duct occlusion however the gallbladder remains collapsed.  Findings discussed with Dr. 11/19/14.  Plan: Cap the cholecystostomy for a trial. If he develops symptoms he has a gravity bag to reconnect to external drainage.  Dr. Andrey Campanile has plans to reevaluate the patient as an outpatient for elective cholecystectomy. He also is scheduling him for cardiac clearance.  SignedAndrey Campanile 01/01/2015, 11:44 AM   I spent a total of    15 Minutes in face to face in clinical consultation, greater than 50% of which was counseling/coordinating care for this patient with a percutaneous cholecystostomy.

## 2015-01-07 ENCOUNTER — Ambulatory Visit (HOSPITAL_COMMUNITY)
Admission: RE | Admit: 2015-01-07 | Discharge: 2015-01-07 | Disposition: A | Discharge status: Home / Self Care | Payer: Medicare Other | Source: Ambulatory Visit | Attending: Surgery | Admitting: Surgery

## 2015-01-07 DIAGNOSIS — I517 Cardiomegaly: Secondary | ICD-10-CM | POA: Insufficient documentation

## 2015-01-07 DIAGNOSIS — Z952 Presence of prosthetic heart valve: Secondary | ICD-10-CM | POA: Diagnosis not present

## 2015-01-07 DIAGNOSIS — I10 Essential (primary) hypertension: Secondary | ICD-10-CM | POA: Insufficient documentation

## 2015-01-07 DIAGNOSIS — Z951 Presence of aortocoronary bypass graft: Secondary | ICD-10-CM | POA: Diagnosis not present

## 2015-01-07 DIAGNOSIS — I351 Nonrheumatic aortic (valve) insufficiency: Secondary | ICD-10-CM | POA: Diagnosis not present

## 2015-01-07 DIAGNOSIS — I340 Nonrheumatic mitral (valve) insufficiency: Secondary | ICD-10-CM | POA: Insufficient documentation

## 2015-01-07 DIAGNOSIS — I350 Nonrheumatic aortic (valve) stenosis: Secondary | ICD-10-CM | POA: Diagnosis not present

## 2015-01-07 DIAGNOSIS — Z954 Presence of other heart-valve replacement: Secondary | ICD-10-CM | POA: Diagnosis not present

## 2015-01-07 DIAGNOSIS — Z953 Presence of xenogenic heart valve: Secondary | ICD-10-CM

## 2015-01-07 NOTE — Progress Notes (Signed)
Echocardiogram 2D Echocardiogram has been performed.  Nolon Rod 01/07/2015, 12:33 PM

## 2015-01-12 ENCOUNTER — Other Ambulatory Visit: Payer: Self-pay | Admitting: Internal Medicine

## 2015-01-14 ENCOUNTER — Ambulatory Visit (INDEPENDENT_AMBULATORY_CARE_PROVIDER_SITE_OTHER): Payer: Medicare Other | Admitting: Surgery

## 2015-01-14 ENCOUNTER — Encounter: Payer: Self-pay | Admitting: Surgery

## 2015-01-14 VITALS — BP 100/53 | HR 66 | Resp 20 | Ht 66.0 in | Wt 151.0 lb

## 2015-01-14 DIAGNOSIS — I350 Nonrheumatic aortic (valve) stenosis: Secondary | ICD-10-CM

## 2015-01-14 DIAGNOSIS — Z954 Presence of other heart-valve replacement: Secondary | ICD-10-CM

## 2015-01-14 DIAGNOSIS — Z951 Presence of aortocoronary bypass graft: Secondary | ICD-10-CM

## 2015-01-14 DIAGNOSIS — Z952 Presence of prosthetic heart valve: Secondary | ICD-10-CM

## 2015-01-14 NOTE — Progress Notes (Signed)
HPI:  Hunter Choi returns for follow up after transapical TAVR on 01/16/2015. He has done well overall and his wife and caregiver say that he is much more active than he was prior to his valve replacement. His only current problem is an episode of acute cholecystitis in August treated with a cholecystostomy tube. He has recovered from this episode and his tube is clamped. The plan is for him to have an elective cholecystectomy in the near future.   Current Outpatient Prescriptions  Medication Sig Dispense Refill  . amLODipine (NORVASC) 5 MG tablet Take 5 mg by mouth daily at 3 pm.    . aspirin EC 81 MG EC tablet Take 1 tablet (81 mg total) by mouth daily.    . diazepam (VALIUM) 5 MG tablet Take 5 mg by mouth daily at 3 pm.     . folic acid (FOLVITE) 1 MG tablet Take 3 mg by mouth daily.    Marland Kitchen levothyroxine (SYNTHROID, LEVOTHROID) 50 MCG tablet Take 50 mcg by mouth daily.  0  . methotrexate 2.5 MG tablet Take 25 mg by mouth once a week. Every Thursday    . metoprolol tartrate (LOPRESSOR) 25 MG tablet Take 1 tablet (25 mg total) by mouth daily. 60 tablet 1  . omeprazole (PRILOSEC) 20 MG capsule Take 20 mg by mouth daily.    . predniSONE (DELTASONE) 5 MG tablet Take 5 mg by mouth every other day.      No current facility-administered medications for this visit.     Physical Exam: BP 100/53 mmHg  Pulse 66  Resp 20  Ht 5\' 6"  (1.676 m)  Wt 151 lb (68.493 kg)  BMI 24.38 kg/m2  SpO2 91% He looks well Cardiac exam shows a regular rate and rhythm with normal heart sounds and no murmur. Lung exam reveals bilateral lower lobe crackles. There is no peripheral edema.  Diagnostic Tests:             *Ursina*         *[facility]*            [address]            [place], [place]              [phone]  ------------------------------------------------------------------- Transthoracic  Echocardiography  Patient:  Hunter, Choi MR #:    [name] Study Date: 01/07/2015 Gender:   M Age:    81 Height:   170.2 cm Weight:   64.4 kg BSA:    1.75 m^2 Pt. Status: Room:  ATTENDING  03/09/2015, MD ORDERING   Evelene Croon, MD REFERRING  Evelene Croon, MD SONOGRAPHER Evelene Croon, RDCS PERFORMING  [facility], Outpatient  cc:  ------------------------------------------------------------------- LV EF: 40% -  45%  ------------------------------------------------------------------- Indications:   Aortic stenosis 424.1.  ------------------------------------------------------------------- History:  PMH:  Coronary artery disease. Congestive heart failure. Risk factors: Hypertension.  ------------------------------------------------------------------- Study Conclusions  - Left ventricle: The cavity size was normal. Wall thickness was increased in a pattern of mild LVH. Systolic function was mildly to moderately reduced. The estimated ejection fraction was in the range of 40% to 45%. Diffuse hypokinesis. Doppler parameters are consistent with abnormal left ventricular relaxation (grade 1 diastolic dysfunction). - Aortic valve: A bioprosthesis was present. There was mild regurgitation. Valve area (VTI): 1.09 cm^2. Valve area (Vmax): 1.08 cm^2. Valve area (Vmean): 1.04 cm^2. - Mitral valve: Mildly to moderately calcified annulus. There was mild regurgitation.  -------------------------------------------------------------------  Labs, prior tests, procedures, and surgery: TAVR. Coronary artery bypass grafting.   Aortic valve replacement. Transthoracic echocardiography. M-mode, complete 2D, spectral Doppler, and color Doppler. Birthdate: Patient birthdate: 07-Sep-1927. Age: Patient is 81 yr old. Sex: Gender: male. BMI: 22.2 kg/m^2. Blood pressure:   115/54 Patient status: Outpatient. Study date: Study  date: 01/07/2015. Study time: 11:27 AM. Location: Echo laboratory.  -------------------------------------------------------------------  ------------------------------------------------------------------- Left ventricle: The global longitudinal strain is -9.5%.  The cavity size was normal. Wall thickness was increased in a pattern of mild LVH. Systolic function was mildly to moderately reduced. The estimated ejection fraction was in the range of 40% to 45%. Diffuse hypokinesis. Doppler parameters are consistent with abnormal left ventricular relaxation (grade 1 diastolic dysfunction).  ------------------------------------------------------------------- Aortic valve: A bioprosthesis was present. Doppler:  There was no stenosis.  There was mild regurgitation.  VTI ratio of LVOT to aortic valve: 0.54. Valve area (VTI): 1.09 cm^2. Indexed valve area (VTI): 0.62 cm^2/m^2. Peak velocity ratio of LVOT to aortic valve: 0.54. Valve area (Vmax): 1.08 cm^2. Indexed valve area (Vmax): 0.62 cm^2/m^2. Mean velocity ratio of LVOT to aortic valve: 0.52. Valve area (Vmean): 1.04 cm^2. Indexed valve area (Vmean): 0.6 cm^2/m^2.  Mean gradient (S): 7 mm Hg. Peak gradient (S): 12 mm Hg.  ------------------------------------------------------------------- Aorta: Aortic root: The aortic root was normal in size. Ascending aorta: The ascending aorta was normal in size.  ------------------------------------------------------------------- Mitral valve:  Mildly to moderately calcified annulus. Doppler: There was mild regurgitation.  ------------------------------------------------------------------- Left atrium: The atrium was normal in size.  ------------------------------------------------------------------- Right ventricle: The cavity size was normal. Systolic function was normal.  ------------------------------------------------------------------- Pulmonic valve:  The valve  appears to be grossly normal. Doppler: There was no significant regurgitation.  ------------------------------------------------------------------- Tricuspid valve:  Structurally normal valve.  Leaflet separation was normal. Doppler: Transvalvular velocity was within the normal range. There was no significant regurgitation.  ------------------------------------------------------------------- Right atrium: The atrium was normal in size.  ------------------------------------------------------------------- Pericardium: There was no pericardial effusion.  ------------------------------------------------------------------- Measurements  Left ventricle              Value     Reference LV ID, ED, PLAX chordal          48.6 mm    43 - 52 LV ID, ES, PLAX chordal      (H)  41.7 mm    23 - 38 LV fx shortening, PLAX chordal   (L)  14  %    >=29 LV PW thickness, ED            12.4 mm    --------- IVS/LV PW ratio, ED            1.04      <=1.3 Stroke volume, 2D             39  ml    --------- Stroke volume/bsa, 2D           22  ml/m^2  --------- LV ejection fraction, 1-p A4C       36  %    --------- LV end-diastolic volume, 2-p       174  ml    --------- LV end-systolic volume, 2-p        110  ml    --------- LV ejection fraction, 2-p         37  %    --------- Stroke volume, 2-p            64  ml    --------- LV end-diastolic  volume/bsa, 2-p     100  ml/m^2  --------- LV end-systolic volume/bsa, 2-p      63  ml/m^2  --------- Stroke volume/bsa, 2-p          36.6 ml/m^2  --------- LV e&', lateral              7.62 cm/s   --------- LV E/e&', lateral             6.36      --------- LV e&', medial               5.66 cm/s    --------- LV E/e&', medial              8.57      --------- LV e&', average              6.64 cm/s   --------- LV E/e&', average             7.3      --------- Longitudinal strain, TDI         10  %    ---------  Ventricular septum            Value     Reference IVS thickness, ED             12.9 mm    ---------  LVOT                   Value     Reference LVOT ID, S                16  mm    --------- LVOT area                 2.01 cm^2   --------- LVOT peak velocity, S           93.3 cm/s   --------- LVOT mean velocity, S           60.6 cm/s   --------- LVOT VTI, S                19.2 cm    ---------  Aortic valve               Value     Reference Aortic valve peak velocity, S       174  cm/s   --------- Aortic valve mean velocity, S       117  cm/s   --------- Aortic valve VTI, S            35.3 cm    --------- Aortic mean gradient, S          7   mm Hg  --------- Aortic peak gradient, S          12  mm Hg  --------- VTI ratio, LVOT/AV            0.54      --------- Aortic valve area, VTI          1.09 cm^2   --------- Aortic valve area/bsa, VTI        0.62 cm^2/m^2 --------- Velocity ratio, peak, LVOT/AV       0.54      --------- Aortic valve area, peak velocity     1.08 cm^2   --------- Aortic valve area/bsa, peak        0.62 cm^2/m^2 --------- velocity Velocity ratio, mean, LVOT/AV       0.52      ---------  Aortic valve area, mean velocity     1.04 cm^2   --------- Aortic valve area/bsa, mean        0.6  cm^2/m^2 --------- velocity Aortic regurg pressure half-time      373  ms    ---------  Aorta                   Value     Reference Aortic root ID, ED            24  mm    ---------  Left atrium                Value     Reference LA ID, A-P, ES              34  mm    --------- LA ID/bsa, A-P              1.95 cm/m^2  <=2.2 LA volume, ES, 1-p A4C          27.5 ml    --------- LA volume/bsa, ES, 1-p A4C        15.7 ml/m^2  ---------  Mitral valve               Value     Reference Mitral E-wave peak velocity        48.5 cm/s   --------- Mitral A-wave peak velocity        72.8 cm/s   --------- Mitral deceleration time      (H)  268  ms    150 - 230 Mitral E/A ratio, peak          0.7      ---------  Right ventricle              Value     Reference RV s&', lateral, S             7.4  cm/s   ---------  Legend: (L) and (H) mark values outside specified reference range.  ------------------------------------------------------------------- Prepared and Electronically Authenticated by  Kristeen Miss, M.D. 2016-10-12T16:58:35   Impression:  His aortic valve is functioning well and his LVEF is 40-45% which is stable for him. I don't see any reason that he couldn't proceed with gallbladder surgery with low cardiac risk. His cath in 12/2012 showed a chronically occluded SVG to the RCA with widely patent SV grafts to the OM, diagonal and LIMA to the LAD.   Plan:  He will follow up with Dr. Katrinka Blazing for his cardiology care and will contact Dr. Andrey Campanile to discuss cholecystectomy.   [name], MD [facility] [phone]

## 2015-01-15 ENCOUNTER — Ambulatory Visit: Payer: Self-pay | Admitting: General Surgery

## 2015-01-25 ENCOUNTER — Other Ambulatory Visit: Payer: Self-pay | Admitting: Internal Medicine

## 2015-01-26 ENCOUNTER — Ambulatory Visit (HOSPITAL_COMMUNITY): Payer: Medicare Other | Admitting: Vascular Surgery

## 2015-01-26 ENCOUNTER — Ambulatory Visit (HOSPITAL_COMMUNITY): Payer: Medicare Other | Admitting: Anesthesiology

## 2015-01-26 ENCOUNTER — Encounter (HOSPITAL_COMMUNITY): Payer: Self-pay

## 2015-01-26 ENCOUNTER — Encounter (HOSPITAL_COMMUNITY)
Admission: RE | Admit: 2015-01-26 | Discharge: 2015-01-26 | Disposition: A | Discharge status: Home / Self Care | Payer: Medicare Other | Source: Ambulatory Visit | Attending: General Surgery | Admitting: General Surgery

## 2015-01-26 ENCOUNTER — Other Ambulatory Visit: Payer: Self-pay | Admitting: Internal Medicine

## 2015-01-26 DIAGNOSIS — Z01812 Encounter for preprocedural laboratory examination: Secondary | ICD-10-CM | POA: Diagnosis not present

## 2015-01-26 DIAGNOSIS — Z01818 Encounter for other preprocedural examination: Secondary | ICD-10-CM | POA: Diagnosis not present

## 2015-01-26 DIAGNOSIS — Z87891 Personal history of nicotine dependence: Secondary | ICD-10-CM | POA: Insufficient documentation

## 2015-01-26 DIAGNOSIS — E871 Hypo-osmolality and hyponatremia: Secondary | ICD-10-CM | POA: Insufficient documentation

## 2015-01-26 DIAGNOSIS — Z7982 Long term (current) use of aspirin: Secondary | ICD-10-CM | POA: Diagnosis not present

## 2015-01-26 DIAGNOSIS — Z79899 Other long term (current) drug therapy: Secondary | ICD-10-CM | POA: Diagnosis not present

## 2015-01-26 DIAGNOSIS — Z8673 Personal history of transient ischemic attack (TIA), and cerebral infarction without residual deficits: Secondary | ICD-10-CM | POA: Diagnosis not present

## 2015-01-26 DIAGNOSIS — K219 Gastro-esophageal reflux disease without esophagitis: Secondary | ICD-10-CM | POA: Insufficient documentation

## 2015-01-26 DIAGNOSIS — Z951 Presence of aortocoronary bypass graft: Secondary | ICD-10-CM | POA: Diagnosis not present

## 2015-01-26 DIAGNOSIS — I2510 Atherosclerotic heart disease of native coronary artery without angina pectoris: Secondary | ICD-10-CM | POA: Insufficient documentation

## 2015-01-26 DIAGNOSIS — E039 Hypothyroidism, unspecified: Secondary | ICD-10-CM | POA: Insufficient documentation

## 2015-01-26 DIAGNOSIS — M069 Rheumatoid arthritis, unspecified: Secondary | ICD-10-CM | POA: Diagnosis not present

## 2015-01-26 DIAGNOSIS — I10 Essential (primary) hypertension: Secondary | ICD-10-CM | POA: Insufficient documentation

## 2015-01-26 DIAGNOSIS — K811 Chronic cholecystitis: Secondary | ICD-10-CM | POA: Insufficient documentation

## 2015-01-26 LAB — COMPREHENSIVE METABOLIC PANEL
ALBUMIN: 3.7 g/dL (ref 3.5–5.0)
ALT: 13 U/L — AB (ref 17–63)
AST: 28 U/L (ref 15–41)
Alkaline Phosphatase: 70 U/L (ref 38–126)
Anion gap: 11 (ref 5–15)
BUN: 16 mg/dL (ref 6–20)
CHLORIDE: 87 mmol/L — AB (ref 101–111)
CO2: 24 mmol/L (ref 22–32)
Calcium: 8.9 mg/dL (ref 8.9–10.3)
Creatinine, Ser: 1.31 mg/dL — ABNORMAL HIGH (ref 0.61–1.24)
GFR calc Af Amer: 56 mL/min — ABNORMAL LOW (ref >60)
GFR, EST NON AFRICAN AMERICAN: 48 mL/min — AB (ref >60)
Glucose, Bld: 115 mg/dL — ABNORMAL HIGH (ref 65–99)
POTASSIUM: 4 mmol/L (ref 3.5–5.1)
SODIUM: 122 mmol/L — AB (ref 135–145)
Total Bilirubin: 0.5 mg/dL (ref 0.3–1.2)
Total Protein: 6.3 g/dL — ABNORMAL LOW (ref 6.5–8.1)

## 2015-01-26 LAB — CBC WITH DIFFERENTIAL/PLATELET
BASOS ABS: 0 10*3/uL (ref 0.0–0.1)
BASOS PCT: 0 %
EOS ABS: 0.1 10*3/uL (ref 0.0–0.7)
EOS PCT: 2 %
HCT: 33.7 % — ABNORMAL LOW (ref 39.0–52.0)
Hemoglobin: 11.2 g/dL — ABNORMAL LOW (ref 13.0–17.0)
Lymphocytes Relative: 37 %
Lymphs Abs: 2.4 10*3/uL (ref 0.7–4.0)
MCH: 28.3 pg (ref 26.0–34.0)
MCHC: 33.2 g/dL (ref 30.0–36.0)
MCV: 85.1 fL (ref 78.0–100.0)
MONO ABS: 0.8 10*3/uL (ref 0.1–1.0)
Monocytes Relative: 11 %
Neutro Abs: 3.2 10*3/uL (ref 1.7–7.7)
Neutrophils Relative %: 50 %
PLATELETS: 122 10*3/uL — AB (ref 150–400)
RBC: 3.96 MIL/uL — AB (ref 4.22–5.81)
RDW: 16.7 % — AB (ref 11.5–15.5)
WBC: 6.6 10*3/uL (ref 4.0–10.5)

## 2015-01-26 MED ORDER — CHLORHEXIDINE GLUCONATE 4 % EX LIQD: 1.0000 "application " | Freq: Once | CUTANEOUS | Status: DC

## 2015-01-26 MED ORDER — CEFOTETAN DISODIUM-DEXTROSE 2-2.08 GM-% IV SOLR
2.0000 g | INTRAVENOUS | Status: DC
  Filled 2015-01-26: qty 50

## 2015-01-26 NOTE — Progress Notes (Signed)
Called Dr. Tawana Scale office to report a low Sodium level. Spoke with Irving Burton, she paged Dr. Andrey Campanile to call me. He returned call and was given Sodium level of 122. He ordered a repeat BMET in AM and he will discuss with anesthesiologist in the AM. Revonda Standard, [place] made aware of results.

## 2015-01-26 NOTE — Progress Notes (Addendum)
Anesthesia Chart Review: Patient is a 81 year old male scheduled for laparoscopic cholecystectomy tomorrow by Dr. Andrey Campanile. PAT was at 3 PM today. He was admitted on 11/14/14 with sepsis due to acute cholecystitis. He also have elevated troponins in the setting of demand ischemia related to sepsis. Percutaneous biliary drain was felt the safest option at that time. Dr. Andrey Campanile has been following him on an out-patient basis and now recommended cholecystectomy.    Other history includes severe AS s/p TAVR 01/15/13, CAD s/p CABG '99 and stent to LIMA to LAD '04, CHF, former smoker, HTN, GERD, BPH s/p TURP, RA, hypothyroidism, CVA (left retinal artery embolus), known left ICA occlusion and s/p right CEA '97, 4.4 cm infrarenal AAA with 4.1 cm left CIA, 5.2 cm right CIA, 4.3 cm right IIA aneurysms by 05/23/14 CTA, former smoker. PCP is listed as Dr. Mendel Ryder. Vascular surgeon is Dr. Edilia Bo (as of 09/03/14, six month follow-up was recommended regarding aneurysm management).   Cardiologist is Dr. Katrinka Blazing who signed a note for clearance on 01/20/15 indicating that no further cardiac work-up needed unless CV complaints. Moderate CV risk.  CT surgeon is Dr. Laneta Simmers, last visit 01/14/15. His note states, "His aortic valve is functioning well and his LVEF is 40-45% which is stable for him. I don't see any reason that he couldn't proceed with gallbladder surgery with low cardiac risk. His cath in 12/2012 showed a chronically occluded SVG to the RCA with widely patent SV grafts to the OM, diagonal and LIMA to the LAD."  Meds include amlodipine, ASA 81 mg, Valium, levothyroxine, folic acid, methotrexate, Lopressor, Prilosec, Zocor.  (His BP was 100/53 at [facility] on 01/14/15 and was 96/36 at PAT. He felt at his baseline. I asked him to hold amlodipine and metoprolol in the morning--at least until his vitals can be checked in holding. Based on results, a determination can be made on whether or not to give one or both of these  medications.)   11/14/14 EKG: SR with first degree AVB, left BBB, LAD. QRS has widened when compared to 05/23/14 tracing.   01/07/15 Echo: Study Conclusions - Left ventricle: The cavity size was normal. Wall thickness was increased in a pattern of mild LVH. Systolic function was mildly to moderately reduced. The estimated ejection fraction was in the range of 40% to 45%. Diffuse hypokinesis. Doppler parameters are consistent with abnormal left ventricular relaxation (grade 1 diastolic dysfunction). - Aortic valve: A bioprosthesis was present. There was mild regurgitation. Valve area (VTI): 1.09 cm^2. Valve area (Vmax): 1.08 cm^2. Valve area (Vmean): 1.04 cm^2. - Mitral valve: Mildly to moderately calcified annulus. There was mild regurgitation.  12/20/12 Cardiac cath: 1. Total occlusion of the saphenous vein graft to the right coronary. This is been previously documented.  2. Widely patent saphenous vein graft to the diagonal, obtuse marginal, and the LIMA to the LAD.  3. Normal pulmonary artery pressures.  4. Ectatic and calcified right aortoiliac anatomy.  5. Previously documented severe/critical aortic stenosis, , documented by echo and suggested by this cath as a diagnostic catheter would not cross the valve.  05/2013 Holter Monitor: No high grade AVB beyond Mobitz 1.  09/03/14 Carotid duplex: IMPRESSION:  1. Widely patent right carotid endarterectomy without evidence of restenosis or hyperplasia. 2. Confirmed occlusion of left internal carotid artery. 3. Elevated velocities are observed in the left external carotid artery likely due to compensation. Minimal plaque is observed.  4. Right vertebral artery is bidirectional. Left is antegrade. 5. Right  subclavian artery is monophasic. Proximal segment was not well visualized and may have significant stenosis vs occlusion. 6. No significant change compared to prior exam.  09/03/14 Abdominal aorta duplex:  1. Abdominal  aortic aneurysm measuring 4.46cm. 2.  Right common iliac artery aneurysm measuring 5.2cm. 3. Left common iliac artery aneurysm measuring 4.1cm. 4. Velocities in the proximal and mid aorta are elevated with diffuse atherosclerotic disease; no significant focal stenosis is observed. 5.  Essentially stable diameter measurements of the aorta and bilateral iliac arteries compared to the CT angiogram performed during an ER visit in February 2016.  11/14/14 CXR: IMPRESSION: No active cardiopulmonary disease.  12/26/12 PFTs: FVC 3.15 (107%), FEV1 2.26 (113%), DLCOunc 17.91 (73%).  Preoperative labs noted. Na 122 (~ 125-128 since 10/2014; prior to that had Na in the low 130's with intermittent levels around 128), Chloride 87 (~ 94-100 over the past year). Cr 1.31. Glucose 115. AST 28, ALT 13. H/H 11.2/33.7. PLT 122K. He is not currently on any diuretic therapy. I asked PAT RN Rosey Bath to call results to [facility] and give me follow-up since I thought Dr. Andrey Campanile may want to admit patient.  Update: Dr. Andrey Campanile aware of BMET results. He does not want to cancel case at this time, but instead get a STAT BMET on arrival tomorrow and then review results with the anesthesiologist to help determine a definitive plan. I have updated anesthesiologist Dr. Marcene Duos. If hyponatremia does not show some improvement (at least 125 or better), then case would likely be canceled.   Velna Ochs [facility] Short Stay Center/Anesthesiology Phone [phone] 01/26/2015 6:18 PM

## 2015-01-26 NOTE — Pre-Procedure Instructions (Signed)
    ANTINIO SANDERFER  01/26/2015      [facility] - [place], [place] - [address] [place] [address] Phone: [phone] Fax: [phone]  [id number] - [place], [place] - [address] [place] [address] Phone: [phone] Fax: [phone]    Your procedure is scheduled on 01/27/15.  Report to [facility] Admitting at 530 A.M.  Call this number if you have problems the morning of surgery:  [phone]   Remember:  Do not eat food or drink liquids after midnight.  Take these medicines the morning of surgery with A SIP OF WATER --amlodipine,valium,synthroid,metoprolol,prilosec   Do not wear jewelry, make-up or nail polish.  Do not wear lotions, powders, or perfumes.  You may wear deodorant.  Do not shave 48 hours prior to surgery.  Men may shave face and neck.  Do not bring valuables to the hospital.  [facility] is not responsible for any belongings or valuables.  Contacts, dentures or bridgework may not be worn into surgery.  Leave your suitcase in the car.  After surgery it may be brought to your room.  For patients admitted to the hospital, discharge time will be determined by your treatment team.  Patients discharged the day of surgery will not be allowed to drive home.   Name and phone number of your driver:   Special instructions:    Please read over the following fact sheets that you were given. Pain Booklet, Coughing and Deep Breathing and Surgical Site Infection Prevention

## 2015-01-26 NOTE — Progress Notes (Signed)
As per Allison,Son to hold [employer]. To bring with him and recheck BP Dos.   112/36,96/34

## 2015-01-27 ENCOUNTER — Encounter (HOSPITAL_COMMUNITY): Payer: Self-pay | Admitting: Anesthesiology

## 2015-01-27 ENCOUNTER — Inpatient Hospital Stay (HOSPITAL_COMMUNITY)
Admission: RE | Admit: 2015-01-27 | Discharge: 2015-02-03 | DRG: 988 | Disposition: A | Discharge status: Home Health | Payer: Medicare Other | Source: Ambulatory Visit | Attending: Internal Medicine | Admitting: Internal Medicine

## 2015-01-27 ENCOUNTER — Encounter (HOSPITAL_COMMUNITY)
Admission: RE | Disposition: A | Discharge status: Home / Self Care | Payer: Self-pay | Source: Ambulatory Visit | Attending: Internal Medicine

## 2015-01-27 DIAGNOSIS — D509 Iron deficiency anemia, unspecified: Secondary | ICD-10-CM | POA: Diagnosis present

## 2015-01-27 DIAGNOSIS — K811 Chronic cholecystitis: Secondary | ICD-10-CM

## 2015-01-27 DIAGNOSIS — E785 Hyperlipidemia, unspecified: Secondary | ICD-10-CM | POA: Diagnosis present

## 2015-01-27 DIAGNOSIS — L8990 Pressure ulcer of unspecified site, unspecified stage: Secondary | ICD-10-CM | POA: Insufficient documentation

## 2015-01-27 DIAGNOSIS — Z7982 Long term (current) use of aspirin: Secondary | ICD-10-CM

## 2015-01-27 DIAGNOSIS — I714 Abdominal aortic aneurysm, without rupture: Secondary | ICD-10-CM | POA: Diagnosis present

## 2015-01-27 DIAGNOSIS — N400 Enlarged prostate without lower urinary tract symptoms: Secondary | ICD-10-CM | POA: Diagnosis present

## 2015-01-27 DIAGNOSIS — K8010 Calculus of gallbladder with chronic cholecystitis without obstruction: Secondary | ICD-10-CM | POA: Diagnosis present

## 2015-01-27 DIAGNOSIS — K219 Gastro-esophageal reflux disease without esophagitis: Secondary | ICD-10-CM | POA: Diagnosis present

## 2015-01-27 DIAGNOSIS — I2510 Atherosclerotic heart disease of native coronary artery without angina pectoris: Secondary | ICD-10-CM | POA: Diagnosis present

## 2015-01-27 DIAGNOSIS — Z954 Presence of other heart-valve replacement: Secondary | ICD-10-CM

## 2015-01-27 DIAGNOSIS — K819 Cholecystitis, unspecified: Secondary | ICD-10-CM | POA: Diagnosis present

## 2015-01-27 DIAGNOSIS — I5042 Chronic combined systolic (congestive) and diastolic (congestive) heart failure: Secondary | ICD-10-CM | POA: Diagnosis present

## 2015-01-27 DIAGNOSIS — E079 Disorder of thyroid, unspecified: Secondary | ICD-10-CM | POA: Diagnosis present

## 2015-01-27 DIAGNOSIS — E039 Hypothyroidism, unspecified: Secondary | ICD-10-CM | POA: Diagnosis present

## 2015-01-27 DIAGNOSIS — E871 Hypo-osmolality and hyponatremia: Secondary | ICD-10-CM | POA: Diagnosis present

## 2015-01-27 DIAGNOSIS — I110 Hypertensive heart disease with heart failure: Secondary | ICD-10-CM | POA: Diagnosis present

## 2015-01-27 DIAGNOSIS — I723 Aneurysm of iliac artery: Secondary | ICD-10-CM | POA: Diagnosis present

## 2015-01-27 DIAGNOSIS — E222 Syndrome of inappropriate secretion of antidiuretic hormone: Principal | ICD-10-CM | POA: Diagnosis present

## 2015-01-27 DIAGNOSIS — I739 Peripheral vascular disease, unspecified: Secondary | ICD-10-CM | POA: Diagnosis present

## 2015-01-27 DIAGNOSIS — M069 Rheumatoid arthritis, unspecified: Secondary | ICD-10-CM | POA: Diagnosis not present

## 2015-01-27 DIAGNOSIS — Z8673 Personal history of transient ischemic attack (TIA), and cerebral infarction without residual deficits: Secondary | ICD-10-CM

## 2015-01-27 DIAGNOSIS — I25810 Atherosclerosis of coronary artery bypass graft(s) without angina pectoris: Secondary | ICD-10-CM | POA: Diagnosis present

## 2015-01-27 DIAGNOSIS — Z952 Presence of prosthetic heart valve: Secondary | ICD-10-CM

## 2015-01-27 DIAGNOSIS — I429 Cardiomyopathy, unspecified: Secondary | ICD-10-CM | POA: Diagnosis present

## 2015-01-27 DIAGNOSIS — I5043 Acute on chronic combined systolic (congestive) and diastolic (congestive) heart failure: Secondary | ICD-10-CM

## 2015-01-27 DIAGNOSIS — D696 Thrombocytopenia, unspecified: Secondary | ICD-10-CM | POA: Diagnosis present

## 2015-01-27 DIAGNOSIS — Z96653 Presence of artificial knee joint, bilateral: Secondary | ICD-10-CM | POA: Diagnosis present

## 2015-01-27 HISTORY — DX: Blindness, one eye, unspecified eye: H54.40

## 2015-01-27 LAB — BASIC METABOLIC PANEL
ANION GAP: 8 (ref 5–15)
Anion gap: 10 (ref 5–15)
BUN: 15 mg/dL (ref 6–20)
BUN: 13 mg/dL (ref 6–20)
CALCIUM: 8.7 mg/dL — AB (ref 8.9–10.3)
CHLORIDE: 90 mmol/L — AB (ref 101–111)
CO2: 25 mmol/L (ref 22–32)
CO2: 27 mmol/L (ref 22–32)
CREATININE: 1.13 mg/dL (ref 0.61–1.24)
Calcium: 8.6 mg/dL — ABNORMAL LOW (ref 8.9–10.3)
Chloride: 84 mmol/L — ABNORMAL LOW (ref 101–111)
Creatinine, Ser: 0.84 mg/dL (ref 0.61–1.24)
GFR calc Af Amer: >60 mL/min (ref >60)
GFR calc Af Amer: >60 mL/min (ref >60)
GFR, EST NON AFRICAN AMERICAN: 57 mL/min — AB (ref >60)
GLUCOSE: 110 mg/dL — AB (ref 65–99)
GLUCOSE: 127 mg/dL — AB (ref 65–99)
POTASSIUM: 4.2 mmol/L (ref 3.5–5.1)
Potassium: 3.9 mmol/L (ref 3.5–5.1)
SODIUM: 125 mmol/L — AB (ref 135–145)
Sodium: 119 mmol/L — CL (ref 135–145)

## 2015-01-27 SURGERY — LAPAROSCOPIC CHOLECYSTECTOMY WITH INTRAOPERATIVE CHOLANGIOGRAM
Anesthesia: General | Site: Abdomen

## 2015-01-27 MED ORDER — 0.9 % SODIUM CHLORIDE (POUR BTL) OPTIME
TOPICAL | Status: DC | PRN
  Administered 2015-01-27: 1000 mL

## 2015-01-27 MED ORDER — BUPIVACAINE-EPINEPHRINE 0.25% -1:200000 IJ SOLN
INTRAMUSCULAR | Status: DC | PRN
  Administered 2015-01-27: 30 mL

## 2015-01-27 MED ORDER — LACTATED RINGERS IV SOLN
INTRAVENOUS | Status: DC | PRN
  Administered 2015-01-27: 07:00:00 via INTRAVENOUS

## 2015-01-27 MED ORDER — SIMVASTATIN 20 MG PO TABS
20.0000 mg | ORAL_TABLET | Freq: Every evening | ORAL | Status: DC
  Administered 2015-01-27 – 2015-02-02 (×6): 20 mg via ORAL
  Filled 2015-01-27 (×6): qty 1

## 2015-01-27 MED ORDER — SODIUM CHLORIDE 0.9 % IJ SOLN
3.0000 mL | Freq: Two times a day (BID) | INTRAMUSCULAR | Status: DC
  Administered 2015-01-27 – 2015-02-03 (×11): 3 mL via INTRAVENOUS

## 2015-01-27 MED ORDER — PANTOPRAZOLE SODIUM 40 MG PO TBEC
40.0000 mg | DELAYED_RELEASE_TABLET | Freq: Every day | ORAL | Status: DC
  Administered 2015-01-27 – 2015-02-03 (×7): 40 mg via ORAL
  Filled 2015-01-27 (×7): qty 1

## 2015-01-27 MED ORDER — ASPIRIN EC 81 MG PO TBEC
81.0000 mg | DELAYED_RELEASE_TABLET | Freq: Every day | ORAL | Status: DC
  Administered 2015-01-27: 81 mg via ORAL
  Filled 2015-01-27: qty 1

## 2015-01-27 MED ORDER — BUPIVACAINE-EPINEPHRINE (PF) 0.25% -1:200000 IJ SOLN
INTRAMUSCULAR | Status: AC
  Filled 2015-01-27: qty 30

## 2015-01-27 MED ORDER — ONDANSETRON HCL 4 MG/2ML IJ SOLN: 4.0000 mg | Freq: Four times a day (QID) | INTRAMUSCULAR | Status: DC | PRN

## 2015-01-27 MED ORDER — INFLUENZA VAC SPLIT QUAD 0.5 ML IM SUSY
0.5000 mL | PREFILLED_SYRINGE | INTRAMUSCULAR | Status: AC
  Administered 2015-01-29: 0.5 mL via INTRAMUSCULAR
  Filled 2015-01-27: qty 0.5

## 2015-01-27 MED ORDER — LEVOTHYROXINE SODIUM 50 MCG PO TABS
50.0000 ug | ORAL_TABLET | Freq: Every day | ORAL | Status: DC
  Administered 2015-01-28 – 2015-02-03 (×7): 50 ug via ORAL
  Filled 2015-01-27 (×7): qty 1

## 2015-01-27 MED ORDER — HEPARIN SODIUM (PORCINE) 5000 UNIT/ML IJ SOLN
5000.0000 [IU] | Freq: Three times a day (TID) | INTRAMUSCULAR | Status: DC
  Administered 2015-01-27 – 2015-01-28 (×3): 5000 [IU] via SUBCUTANEOUS
  Filled 2015-01-27 (×3): qty 1

## 2015-01-27 MED ORDER — ONDANSETRON HCL 4 MG PO TABS: 4.0000 mg | ORAL_TABLET | Freq: Four times a day (QID) | ORAL | Status: DC | PRN

## 2015-01-27 MED ORDER — AMLODIPINE BESYLATE 5 MG PO TABS
5.0000 mg | ORAL_TABLET | Freq: Every day | ORAL | Status: DC
  Administered 2015-01-29 – 2015-02-02 (×5): 5 mg via ORAL
  Filled 2015-01-27 (×5): qty 1

## 2015-01-27 MED ORDER — SODIUM CHLORIDE 0.9 % IR SOLN
Status: DC | PRN
  Administered 2015-01-27: 1000 mL

## 2015-01-27 MED ORDER — FENTANYL CITRATE (PF) 250 MCG/5ML IJ SOLN
INTRAMUSCULAR | Status: AC
  Filled 2015-01-27: qty 5

## 2015-01-27 MED ORDER — METHOTREXATE 2.5 MG PO TABS
25.0000 mg | ORAL_TABLET | ORAL | Status: DC
  Administered 2015-01-29: 25 mg via ORAL
  Filled 2015-01-27: qty 10

## 2015-01-27 MED ORDER — SODIUM CHLORIDE 0.9 % IV SOLN
INTRAVENOUS | Status: AC
  Administered 2015-01-27: 13:00:00 via INTRAVENOUS

## 2015-01-27 MED ORDER — METOPROLOL TARTRATE 25 MG PO TABS
25.0000 mg | ORAL_TABLET | Freq: Every day | ORAL | Status: DC
  Administered 2015-01-28 – 2015-01-29 (×2): 25 mg via ORAL
  Filled 2015-01-27: qty 2
  Filled 2015-01-27: qty 1

## 2015-01-27 NOTE — Anesthesia Preprocedure Evaluation (Addendum)
Anesthesia Evaluation  Patient identified by MRN, date of birth, ID band Patient awake    Reviewed: Allergy & Precautions, NPO status , Patient's Chart, lab work & pertinent test results  History of Anesthesia Complications Negative for: history of anesthetic complications  Airway Mallampati: II  TM Distance: >3 FB Neck ROM: Full    Dental  (+) Missing, Dental Advisory Given, Poor Dentition, Chipped   Pulmonary COPD, former smoker,    breath sounds clear to auscultation       Cardiovascular hypertension, Pt. on medications and Pt. on home beta blockers (-) angina+ CAD, + Cardiac Stents, + CABG and + Peripheral Vascular Disease  + Valvular Problems/Murmurs (s/p TAVR) AS and AI  Rhythm:Regular Rate:Normal  10/16 ECHO: EF 40% to 45%. Diffuse hypokinesis, mild AI   Neuro/Psych Anxiety TIA   GI/Hepatic Neg liver ROS, GERD  Medicated and Controlled,  Endo/Other  Hypothyroidism   Renal/GU Renal InsufficiencyRenal disease (creat 1.31)     Musculoskeletal  (+) Arthritis , Rheumatoid disorders,    Abdominal   Peds  Hematology   Anesthesia Other Findings   Reproductive/Obstetrics                            Anesthesia Physical Anesthesia Plan  ASA: III  Anesthesia Plan: General   Post-op Pain Management:    Induction: Intravenous  Airway Management Planned: Oral ETT  Additional Equipment:   Intra-op Plan:   Post-operative Plan: Extubation in OR  Informed Consent: I have reviewed the patients History and Physical, chart, labs and discussed the procedure including the risks, benefits and alternatives for the proposed anesthesia with the patient or authorized representative who has indicated his/her understanding and acceptance.   Dental advisory given  Plan Discussed with: CRNA and Surgeon  Anesthesia Plan Comments: (Plan routine monitors, GETA)        Anesthesia Quick  Evaluation

## 2015-01-27 NOTE — H&P (Signed)
[facility] History and Physical  Hunter Choi CNO:[id number] DOB: 1927/04/02 DOA: 01/27/2015  Referring physician: Emergency Department PCP: Tommy Rainwater, MD   CHIEF COMPLAINT: Hyponatremia  HPI: Hunter Choi is a 81 y.o. male with multiple medical problems not limited to coronary artery disease , hypertension, hyperlipidemia, chronic hyponatremia and chronic thrombocytopenia . He was hospitalzed in late August with acalculoous cholecystitis, UTI,   elevated troponins (demand ischemia).  Percutaneous drain placed, treated with antibiotics. Patient discharged home with perc drain and plans to have cholecystectomy. Cardiology cleared patient for surgery. He arrived this am for surgery but found to have acute on chronic hyponatremia with a sodium of 119 .surgery is canceled cholecystectomy, asked [place] to admit for management of hyponatremia. Patient has no medical complaints  Labs:   CBC yesterday- WBC 6.6, hgb 11, platelets 122  basic metabolic profile today -Sodium 119,  chloride 84 , creatinine 1.13       Medications  cefoTEtan in Dextrose 5% (CEFOTAN) IVPB 2 g (not administered)  chlorhexidine (HIBICLENS) 4 % liquid 1 application (not administered)  chlorhexidine (HIBICLENS) 4 % liquid 1 application (not administered)  0.9 % irrigation (POUR BTL) (1,000 mLs Irrigation Given 01/27/15 0711)  sodium chloride irrigation 0.9 % (1,000 mLs Irrigation Given 01/27/15 0711)  bupivacaine-EPINEPHrine (MARCAINE W/ EPI) 0.25% -1:200000 (with pres) injection (30 mLs Infiltration Given 01/27/15 0711)    REVIEW OF SYSTEMS:  Review of Systems  Constitutional: Negative.   HENT: Negative.   Eyes: Negative.   Respiratory: Negative.   Cardiovascular: Negative.   Gastrointestinal: Negative.   Genitourinary: Negative.   Musculoskeletal: Negative.   Skin: Negative.   Neurological: Negative.   Endo/Heme/Allergies: Negative.   Psychiatric/Behavioral: Negative.     Past Medical  History  Diagnosis Date  . Hypertension   . GERD (gastroesophageal reflux disease)   . Thyroid disease     HYPOTHYROIDISM  . Aortic stenosis, severe   . Embolism involving retinal artery   . BPH (benign prostatic hypertrophy)     Dr. Retta Diones  . Iliac artery aneurysm (HCC)     Dr. Edilia Bo  . AAA (abdominal aortic aneurysm) (HCC)     Dr. Ashley Royalty  . Diastolic heart failure (HCC)   . 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 Coronary artery disease     Left main disease and severe 3-vessel CAD  . CAD (coronary artery disease) of bypass graft     Chronic occlusion of saphenous vein graft to RCA  . Hypothyroidism   . Shortness of breath   . Heart murmur   . Stroke [facility])     mini stroke effective his eye left  . Arthritis   . 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 Atrial fibrillation (HCC)   . Anxiety    Past Surgical History  Procedure Laterality Date  . Left hip hemiarthropasty      AFTER HIP FX  . Coronary artery bypass graft      1999..Dr. Sheliah Plane  . Total knee arthroplasty      right 1992   left  2002  . Biopsy prostate      2005  . Prostate surgery  2012    TURP  . Joint replacement  2007    THR  .  Harvest bone graft  1982    FOR THE  ANKLE  . Skin grafts      1968  . Colonoscopy    . Toe surgery Bilateral   . Transcatheter aortic valve replacement, transapical N/A 01/15/2013    Procedure: TRANSCATHETER AORTIC VALVE REPLACEMENT, TRANSAPICAL;  Surgeon: [name], MD;  Location: [facility] OR;  Service: Open Heart Surgery;  Laterality: N/A;  . Intraoperative transesophageal echocardiogram N/A 01/15/2013    Procedure: INTRAOPERATIVE TRANSESOPHAGEAL ECHOCARDIOGRAM;  Surgeon: [name], MD;  Location: [facility] OR;  Service: Open Heart Surgery;  Laterality: N/A;    . Left and right heart catheterization with coronary angiogram N/A 12/20/2012    Procedure: LEFT AND RIGHT HEART CATHETERIZATION WITH CORONARY ANGIOGRAM;  Surgeon: Lesleigh Noe, MD;  Location: [facility] CATH LAB;  Service: Cardiovascular;  Laterality: N/A;  . Carotid endarterectomy Right Jan. 2, 1997    CE  . Transcather heart valve      SOCIAL HISTORY:  reports that he quit smoking about 56 years ago. His smoking use included Cigarettes. He has a 23.5 pack-year smoking history. He has quit using smokeless tobacco. His smokeless tobacco use included Chew. He reports that he does not drink alcohol or use illicit drugs. Lives:  at home With:    Wife and son Assistive devices:   walker needed for ambulation.   Allergies  Allergen Reactions  . Ciprofloxacin Other (See Comments)    REACTION: mild delirium    Family History  Problem Relation Age of Onset  . Heart attack Mother   . Heart disease Mother     Before age 42  . Hyperlipidemia Mother   . Hypertension Mother   . Hypertension Son   . Heart disease Father     AAA-   . Hyperlipidemia Father   . Heart attack Father   . Hypertension Father   . Heart disease Brother     Before age 30  . Hyperlipidemia Brother   . Heart attack Brother   . Hypertension Brother     Prior to Admission medications   Medication Sig Start Date End Date Taking? Authorizing Provider  amLODipine (NORVASC) 5 MG tablet Take 5 mg by mouth daily at 3 pm. 11/11/14  Yes Historical Provider, MD  aspirin EC 81 MG EC tablet Take 1 tablet (81 mg total) by mouth daily. 01/21/13  Yes Donielle Margaretann Loveless, PA-C  diazepam (VALIUM) 5 MG tablet Take 5 mg by mouth daily at 3 pm.    Yes Historical Provider, MD  folic acid (FOLVITE) 1 MG tablet Take 3 mg by mouth daily.   Yes Historical Provider, MD  levothyroxine (SYNTHROID, LEVOTHROID) 50 MCG tablet Take 50 mcg by mouth daily. 11/06/14  Yes Historical Provider, MD  methotrexate 2.5 MG tablet Take 25 mg by mouth once a  week. Every Thursday   Yes Historical Provider, MD  metoprolol tartrate (LOPRESSOR) 25 MG tablet Take 1 tablet (25 mg total) by mouth daily. 01/21/13  Yes Donielle Margaretann Loveless, PA-C  omeprazole (PRILOSEC) 20 MG capsule Take 20 mg by mouth daily.   Yes Historical Provider, MD  simvastatin (ZOCOR) 20 MG tablet Take 20 mg by mouth every evening. 12/20/14  Yes Historical Provider, MD   PHYSICAL EXAM: Filed Vitals:   01/27/15 0633  BP: 160/72  Pulse: 82  Temp: 97.3 F (36.3 C)  TempSrc: Oral  Resp: 16  SpO2: 96%    Wt Readings from Last 3 Encounters:  01/14/15 68.493 kg (151  lb)  11/18/14 71.4 kg (157 lb 6.5 oz)  09/03/14 69.854 kg (154 lb)    General:  Pleasant white male male. Appears calm and comfortable Eyes: PER, normal lids, irises & conjunctiva ENT: grossly normal hearing, lips & tongue Neck: no LAD, no masses Cardiovascular: RRR, no LE edema.  Respiratory: Respirations even and unlabored. Normal respiratory effort. Lungs CTA bilaterally, no wheezes / rales .   Abdomen: soft, non-distended, non-tender, active bowel sounds. No obvious masses. Right upper quadrant perc drain. Output bilous Skin: no rash seen on limited exam Musculoskeletal: grossly normal tone BUE/BLE Psychiatric: grossly normal mood and affect, speech fluent and appropriate Neurologic: grossly non-focal.         LABS ON ADMISSION:    Basic Metabolic Panel:  Recent Labs Lab 01/26/15 1552 01/27/15 0703  NA 122* 119*  K 4.0 3.9  CL 87* 84*  CO2 24 25  GLUCOSE 115* 110*  BUN 16 15  CREATININE 1.31* 1.13  CALCIUM 8.9 8.7*   Liver Function Tests:  Recent Labs Lab 01/26/15 1552  AST 28  ALT 13*  ALKPHOS 70  BILITOT 0.5  PROT 6.3*  ALBUMIN 3.7    CBC:  Recent Labs Lab 01/26/15 1552  WBC 6.6  NEUTROABS 3.2  HGB 11.2*  HCT 33.7*  MCV 85.1  PLT 122*    ASSESSMENT / PLAN    Hyponatremia. Acute on chronic. Etiology not clear but possible increased free water intake over last 24  hours. Baseline sodium mid 120s, down to 119 this am.  -admit to telemetry -Normal saline infusion x 24 hours.  -Recheck bmet this afternoon and again in am.  -obtain EKG   Acalculous cholecystitis (August). Patient has known chronic cystic duct obstruction, he is s/p cholecystostomy tube in August. Plan was for cholecystectomy today but that has been postponed secondary to hyponatremia.  No need for antibiotics per surgery. Surgery to follow this admission.     Diastolic heart failure, grade I by echo this month. EF 40-45%  Hyperlipidemia.  -Continue home Zocor  HTN.  -Continue Norvas and Metoprolol -start both tomorrow  Hx of aortic stenosis, s/p TAVR in 2015  PAD / PVD.  AAA / bilateral common iliac artery aneurysms. Not candidate for repair but stable in size at time of last follow up at with Vascular June 2016.   Hypothyroidism -continue Synthroid  Rheumatoid Arthritis  -on Methotrexate. Dose due Friday  GERD,  -continue daily PPI-start in am  CAD / CABG 1999.   Chronic thrombocytopenia, stable.    CONSULTANTS:  General Surgery   Code Status: full DVT Prophylaxis: heparin Family Communication:  Patient alert, oriented and understands plan of care.   Disposition Plan: Discharge to home in 24-48 hours   Time spent:  60 minutes Willette Cluster  NP [facility] Pager [phone]

## 2015-01-27 NOTE — Progress Notes (Signed)
Pt alert and oriented x 3. Skin warm and dry. Heart rate strong and regular. Transfers with walker x 1 assist. Pt does say that he has had falls at home before admission.  Pt has biliary drain intact to right abdomen which is connected to a leg drainage bag. Pt denies pain and discomfort.

## 2015-01-27 NOTE — Progress Notes (Signed)
Na results available & called as a critical value, Dr. Andrey Campanile aware. Case will be cancelled for today.

## 2015-01-27 NOTE — Progress Notes (Addendum)
If hyponatremia improves to around his baseline, would like to proceed with lap cholecystectomy during this admission. Will follow Appreciate [facility] assistance  Mary Sella. Andrey Campanile, MD, FACS General, Bariatric, & Minimally Invasive Surgery [facility], [place]

## 2015-01-27 NOTE — Progress Notes (Signed)
Call to Transferring Nurse, Marlene Bast, she will meet pt. On his arrival to [facility].  Report given, questions answered.

## 2015-01-27 NOTE — Progress Notes (Signed)
Pt's sodium level continues to decrease. Do not feel it is safe to undergo surgery today given hyponatremia. Will cancel surgery. Will ask triad to admit to workup.   Mary Sella. Andrey Campanile, MD, FACS General, Bariatric, & Minimally Invasive Surgery [facility], [place]

## 2015-01-28 ENCOUNTER — Encounter (HOSPITAL_COMMUNITY): Payer: Self-pay | Admitting: Certified Registered Nurse Anesthetist

## 2015-01-28 ENCOUNTER — Observation Stay (HOSPITAL_COMMUNITY): Payer: Medicare Other | Admitting: Anesthesiology

## 2015-01-28 ENCOUNTER — Encounter (HOSPITAL_COMMUNITY)
Admission: RE | Disposition: A | Discharge status: Home / Self Care | Payer: Self-pay | Source: Ambulatory Visit | Attending: Internal Medicine

## 2015-01-28 ENCOUNTER — Observation Stay (HOSPITAL_COMMUNITY): Payer: Medicare Other

## 2015-01-28 DIAGNOSIS — Z954 Presence of other heart-valve replacement: Secondary | ICD-10-CM | POA: Diagnosis not present

## 2015-01-28 DIAGNOSIS — Z9049 Acquired absence of other specified parts of digestive tract: Secondary | ICD-10-CM | POA: Diagnosis not present

## 2015-01-28 DIAGNOSIS — L8990 Pressure ulcer of unspecified site, unspecified stage: Secondary | ICD-10-CM | POA: Insufficient documentation

## 2015-01-28 DIAGNOSIS — I5042 Chronic combined systolic (congestive) and diastolic (congestive) heart failure: Secondary | ICD-10-CM | POA: Diagnosis not present

## 2015-01-28 DIAGNOSIS — K811 Chronic cholecystitis: Secondary | ICD-10-CM

## 2015-01-28 DIAGNOSIS — E871 Hypo-osmolality and hyponatremia: Secondary | ICD-10-CM | POA: Diagnosis not present

## 2015-01-28 DIAGNOSIS — D696 Thrombocytopenia, unspecified: Secondary | ICD-10-CM

## 2015-01-28 DIAGNOSIS — I472 Ventricular tachycardia: Secondary | ICD-10-CM | POA: Diagnosis not present

## 2015-01-28 DIAGNOSIS — E222 Syndrome of inappropriate secretion of antidiuretic hormone: Secondary | ICD-10-CM | POA: Diagnosis not present

## 2015-01-28 HISTORY — PX: CHOLECYSTECTOMY: SHX55

## 2015-01-28 LAB — BASIC METABOLIC PANEL
Anion gap: 10 (ref 5–15)
BUN: 10 mg/dL (ref 6–20)
CALCIUM: 8.5 mg/dL — AB (ref 8.9–10.3)
CO2: 24 mmol/L (ref 22–32)
CREATININE: 0.81 mg/dL (ref 0.61–1.24)
Chloride: 92 mmol/L — ABNORMAL LOW (ref 101–111)
GFR calc non Af Amer: >60 mL/min (ref >60)
GLUCOSE: 104 mg/dL — AB (ref 65–99)
Potassium: 3.8 mmol/L (ref 3.5–5.1)
Sodium: 126 mmol/L — ABNORMAL LOW (ref 135–145)

## 2015-01-28 LAB — SURGICAL PCR SCREEN
MRSA, PCR: NEGATIVE
Staphylococcus aureus: NEGATIVE

## 2015-01-28 SURGERY — LAPAROSCOPIC CHOLECYSTECTOMY WITH INTRAOPERATIVE CHOLANGIOGRAM
Anesthesia: General | Site: Abdomen

## 2015-01-28 MED ORDER — ASPIRIN EC 81 MG PO TBEC
81.0000 mg | DELAYED_RELEASE_TABLET | Freq: Every day | ORAL | Status: DC
  Administered 2015-01-29 – 2015-02-03 (×6): 81 mg via ORAL
  Filled 2015-01-28 (×6): qty 1

## 2015-01-28 MED ORDER — LIDOCAINE HCL (CARDIAC) 20 MG/ML IV SOLN
INTRAVENOUS | Status: DC | PRN
  Administered 2015-01-28: 40 mg via INTRAVENOUS

## 2015-01-28 MED ORDER — BUPIVACAINE-EPINEPHRINE (PF) 0.25% -1:200000 IJ SOLN
INTRAMUSCULAR | Status: AC
  Filled 2015-01-28: qty 30

## 2015-01-28 MED ORDER — FENTANYL CITRATE (PF) 100 MCG/2ML IJ SOLN
INTRAMUSCULAR | Status: DC | PRN
  Administered 2015-01-28: 50 ug via INTRAVENOUS

## 2015-01-28 MED ORDER — ROCURONIUM BROMIDE 100 MG/10ML IV SOLN
INTRAVENOUS | Status: DC | PRN
  Administered 2015-01-28: 35 mg via INTRAVENOUS

## 2015-01-28 MED ORDER — PROPOFOL 10 MG/ML IV BOLUS
INTRAVENOUS | Status: DC | PRN
  Administered 2015-01-28: 100 mg via INTRAVENOUS

## 2015-01-28 MED ORDER — HEPARIN SODIUM (PORCINE) 5000 UNIT/ML IJ SOLN
5000.0000 [IU] | Freq: Three times a day (TID) | INTRAMUSCULAR | Status: DC
Start: 2015-01-28 — End: 2015-01-28

## 2015-01-28 MED ORDER — FENTANYL CITRATE (PF) 100 MCG/2ML IJ SOLN
INTRAMUSCULAR | Status: AC
  Administered 2015-01-28: 25 ug via INTRAVENOUS
  Filled 2015-01-28: qty 2

## 2015-01-28 MED ORDER — 0.9 % SODIUM CHLORIDE (POUR BTL) OPTIME
TOPICAL | Status: DC | PRN
  Administered 2015-01-28: 1000 mL

## 2015-01-28 MED ORDER — SUCCINYLCHOLINE CHLORIDE 20 MG/ML IJ SOLN
INTRAMUSCULAR | Status: AC
  Filled 2015-01-28: qty 1

## 2015-01-28 MED ORDER — SODIUM CHLORIDE 0.9 % IV SOLN
INTRAVENOUS | Status: DC | PRN
  Administered 2015-01-28: 14:00:00 via INTRAVENOUS

## 2015-01-28 MED ORDER — ROCURONIUM BROMIDE 50 MG/5ML IV SOLN
INTRAVENOUS | Status: AC
  Filled 2015-01-28: qty 1

## 2015-01-28 MED ORDER — SODIUM CHLORIDE 0.9 % IV SOLN
INTRAVENOUS | Status: DC | PRN
  Administered 2015-01-28: 10 mL

## 2015-01-28 MED ORDER — OXYCODONE HCL 5 MG/5ML PO SOLN: 5.0000 mg | Freq: Once | ORAL | Status: AC | PRN

## 2015-01-28 MED ORDER — BUPIVACAINE-EPINEPHRINE 0.25% -1:200000 IJ SOLN
INTRAMUSCULAR | Status: DC | PRN
  Administered 2015-01-28: 20 mL

## 2015-01-28 MED ORDER — NEOSTIGMINE METHYLSULFATE 10 MG/10ML IV SOLN
INTRAVENOUS | Status: AC
  Filled 2015-01-28: qty 1

## 2015-01-28 MED ORDER — HEPARIN SODIUM (PORCINE) 5000 UNIT/ML IJ SOLN
5000.0000 [IU] | Freq: Three times a day (TID) | INTRAMUSCULAR | Status: DC
  Administered 2015-01-29 – 2015-02-03 (×16): 5000 [IU] via SUBCUTANEOUS
  Filled 2015-01-28 (×16): qty 1

## 2015-01-28 MED ORDER — LACTATED RINGERS IV SOLN
INTRAVENOUS | Status: DC
  Administered 2015-01-28: 12:00:00 via INTRAVENOUS

## 2015-01-28 MED ORDER — HEPARIN SODIUM (PORCINE) 5000 UNIT/ML IJ SOLN: 5000.0000 [IU] | Freq: Three times a day (TID) | INTRAMUSCULAR | Status: DC

## 2015-01-28 MED ORDER — GLYCOPYRROLATE 0.2 MG/ML IJ SOLN
INTRAMUSCULAR | Status: AC
  Filled 2015-01-28: qty 3

## 2015-01-28 MED ORDER — LIDOCAINE HCL (CARDIAC) 20 MG/ML IV SOLN
INTRAVENOUS | Status: AC
  Filled 2015-01-28: qty 5

## 2015-01-28 MED ORDER — MORPHINE SULFATE (PF) 2 MG/ML IV SOLN: 1.0000 mg | INTRAVENOUS | Status: DC | PRN

## 2015-01-28 MED ORDER — OXYCODONE HCL 5 MG PO TABS
2.5000 mg | ORAL_TABLET | ORAL | Status: DC | PRN
  Administered 2015-01-28: 5 mg via ORAL

## 2015-01-28 MED ORDER — ACETAMINOPHEN 500 MG PO TABS
1000.0000 mg | ORAL_TABLET | Freq: Four times a day (QID) | ORAL | Status: DC
  Administered 2015-01-29 (×3): 1000 mg via ORAL
  Filled 2015-01-28 (×3): qty 2

## 2015-01-28 MED ORDER — ACETAMINOPHEN 10 MG/ML IV SOLN
INTRAVENOUS | Status: DC | PRN
Start: 2015-01-28 — End: 2015-01-28
  Administered 2015-01-28: 1000 mg via INTRAVENOUS

## 2015-01-28 MED ORDER — PHENYLEPHRINE HCL 10 MG/ML IJ SOLN
10.0000 mg | INTRAVENOUS | Status: DC | PRN
  Administered 2015-01-28: 20 ug/min via INTRAVENOUS

## 2015-01-28 MED ORDER — ONDANSETRON HCL 4 MG/2ML IJ SOLN: 4.0000 mg | Freq: Once | INTRAMUSCULAR | Status: DC | PRN

## 2015-01-28 MED ORDER — ACETAMINOPHEN 10 MG/ML IV SOLN
INTRAVENOUS | Status: AC
  Filled 2015-01-28: qty 100

## 2015-01-28 MED ORDER — SODIUM CHLORIDE 0.9 % IR SOLN
Status: DC | PRN
  Administered 2015-01-28: 1000 mL

## 2015-01-28 MED ORDER — ONDANSETRON HCL 4 MG/2ML IJ SOLN
INTRAMUSCULAR | Status: AC
  Filled 2015-01-28: qty 2

## 2015-01-28 MED ORDER — OXYCODONE HCL 5 MG PO TABS
5.0000 mg | ORAL_TABLET | Freq: Once | ORAL | Status: AC | PRN
  Administered 2015-01-28: 5 mg via ORAL

## 2015-01-28 MED ORDER — ARTIFICIAL TEARS OP OINT
TOPICAL_OINTMENT | OPHTHALMIC | Status: DC | PRN
  Administered 2015-01-28: 1 via OPHTHALMIC

## 2015-01-28 MED ORDER — PHENYLEPHRINE HCL 10 MG/ML IJ SOLN
INTRAMUSCULAR | Status: DC | PRN
  Administered 2015-01-28 (×2): 80 ug via INTRAVENOUS
  Administered 2015-01-28: 120 ug via INTRAVENOUS
  Administered 2015-01-28: 80 ug via INTRAVENOUS

## 2015-01-28 MED ORDER — SODIUM CHLORIDE 0.9 % IV SOLN
INTRAVENOUS | Status: DC
  Administered 2015-01-29: 14:00:00 via INTRAVENOUS

## 2015-01-28 MED ORDER — NEOSTIGMINE METHYLSULFATE 10 MG/10ML IV SOLN
INTRAVENOUS | Status: DC | PRN
  Administered 2015-01-28: 3 mg via INTRAVENOUS

## 2015-01-28 MED ORDER — CEFTRIAXONE SODIUM 2 G IJ SOLR
2.0000 g | INTRAMUSCULAR | Status: AC
  Administered 2015-01-28: 2 g via INTRAVENOUS
  Filled 2015-01-28: qty 2

## 2015-01-28 MED ORDER — ONDANSETRON HCL 4 MG/2ML IJ SOLN
INTRAMUSCULAR | Status: DC | PRN
  Administered 2015-01-28: 4 mg via INTRAVENOUS

## 2015-01-28 MED ORDER — FENTANYL CITRATE (PF) 100 MCG/2ML IJ SOLN
25.0000 ug | INTRAMUSCULAR | Status: DC | PRN
  Administered 2015-01-28: 25 ug via INTRAVENOUS

## 2015-01-28 MED ORDER — GLYCOPYRROLATE 0.2 MG/ML IJ SOLN
INTRAMUSCULAR | Status: DC | PRN
  Administered 2015-01-28: 0.4 mg via INTRAVENOUS

## 2015-01-28 MED ORDER — ARTIFICIAL TEARS OP OINT
TOPICAL_OINTMENT | OPHTHALMIC | Status: AC
Start: 2015-01-28 — End: 2015-01-28
  Filled 2015-01-28: qty 3.5

## 2015-01-28 MED ORDER — PROPOFOL 10 MG/ML IV BOLUS
INTRAVENOUS | Status: AC
  Filled 2015-01-28: qty 20

## 2015-01-28 MED ORDER — FENTANYL CITRATE (PF) 250 MCG/5ML IJ SOLN
INTRAMUSCULAR | Status: AC
  Filled 2015-01-28: qty 5

## 2015-01-28 MED ORDER — OXYCODONE HCL 5 MG PO TABS
ORAL_TABLET | ORAL | Status: AC
  Administered 2015-01-28: 5 mg via ORAL
  Filled 2015-01-28: qty 2

## 2015-01-28 SURGICAL SUPPLY — 51 items
APPLIER CLIP 5 13 M/L LIGAMAX5 (MISCELLANEOUS) ×3
BANDAGE ADH SHEER 1  50/CT (GAUZE/BANDAGES/DRESSINGS) ×9 IMPLANT
BENZOIN TINCTURE PRP APPL 2/3 (GAUZE/BANDAGES/DRESSINGS) ×3 IMPLANT
BLADE SURG ROTATE 9660 (MISCELLANEOUS) IMPLANT
BNDG COHESIVE 2X5 WHT NS (GAUZE/BANDAGES/DRESSINGS) ×3 IMPLANT
CANISTER SUCTION 2500CC (MISCELLANEOUS) ×3 IMPLANT
CHLORAPREP W/TINT 26ML (MISCELLANEOUS) ×3 IMPLANT
CLIP APPLIE 5 13 M/L LIGAMAX5 (MISCELLANEOUS) ×1 IMPLANT
CLOSURE STERI-STRIP 1/2X4 (GAUZE/BANDAGES/DRESSINGS) ×1
CLOSURE WOUND 1/2 X4 (GAUZE/BANDAGES/DRESSINGS) ×2
CLSR STERI-STRIP ANTIMIC 1/2X4 (GAUZE/BANDAGES/DRESSINGS) ×2 IMPLANT
COVER MAYO STAND STRL (DRAPES) ×3 IMPLANT
COVER SURGICAL LIGHT HANDLE (MISCELLANEOUS) ×3 IMPLANT
DRAPE C-ARM 42X72 X-RAY (DRAPES) ×3 IMPLANT
DRAPE LAPAROSCOPIC ABDOMINAL (DRAPES) ×6 IMPLANT
DRSG TEGADERM 2-3/8X2-3/4 SM (GAUZE/BANDAGES/DRESSINGS) ×9 IMPLANT
DRSG TEGADERM 4X4.75 (GAUZE/BANDAGES/DRESSINGS) ×3 IMPLANT
ELECT REM PT RETURN 9FT ADLT (ELECTROSURGICAL) ×3
ELECTRODE REM PT RTRN 9FT ADLT (ELECTROSURGICAL) ×1 IMPLANT
GAUZE SPONGE 2X2 8PLY STRL LF (GAUZE/BANDAGES/DRESSINGS) ×1 IMPLANT
GLOVE BIOGEL M STRL SZ7.5 (GLOVE) ×6 IMPLANT
GLOVE BIOGEL PI IND STRL 7.0 (GLOVE) ×3 IMPLANT
GLOVE BIOGEL PI IND STRL 8 (GLOVE) ×1 IMPLANT
GLOVE BIOGEL PI INDICATOR 7.0 (GLOVE) ×6
GLOVE BIOGEL PI INDICATOR 8 (GLOVE) ×2
GLOVE ECLIPSE 7.0 STRL STRAW (GLOVE) ×3 IMPLANT
GOWN STRL REUS W/ TWL LRG LVL3 (GOWN DISPOSABLE) ×3 IMPLANT
GOWN STRL REUS W/ TWL XL LVL3 (GOWN DISPOSABLE) ×1 IMPLANT
GOWN STRL REUS W/TWL LRG LVL3 (GOWN DISPOSABLE) ×6
GOWN STRL REUS W/TWL XL LVL3 (GOWN DISPOSABLE) ×2
KIT BASIN OR (CUSTOM PROCEDURE TRAY) ×3 IMPLANT
KIT ROOM TURNOVER OR (KITS) ×3 IMPLANT
LIQUID BAND (GAUZE/BANDAGES/DRESSINGS) ×6 IMPLANT
NS IRRIG 1000ML POUR BTL (IV SOLUTION) ×3 IMPLANT
PAD ARMBOARD 7.5X6 YLW CONV (MISCELLANEOUS) ×3 IMPLANT
POUCH RETRIEVAL ECOSAC 10 (ENDOMECHANICALS) ×1 IMPLANT
POUCH RETRIEVAL ECOSAC 10MM (ENDOMECHANICALS) ×2
SCISSORS LAP 5X35 DISP (ENDOMECHANICALS) ×3 IMPLANT
SET CHOLANGIOGRAPH 5 50 .035 (SET/KITS/TRAYS/PACK) ×3 IMPLANT
SET IRRIG TUBING LAPAROSCOPIC (IRRIGATION / IRRIGATOR) ×3 IMPLANT
SLEEVE ENDOPATH XCEL 5M (ENDOMECHANICALS) ×6 IMPLANT
SPECIMEN JAR SMALL (MISCELLANEOUS) ×3 IMPLANT
SPONGE GAUZE 2X2 STER 10/PKG (GAUZE/BANDAGES/DRESSINGS) ×2
STRIP CLOSURE SKIN 1/2X4 (GAUZE/BANDAGES/DRESSINGS) ×4 IMPLANT
SUT MNCRL AB 4-0 PS2 18 (SUTURE) ×6 IMPLANT
TOWEL OR 17X24 6PK STRL BLUE (TOWEL DISPOSABLE) ×6 IMPLANT
TOWEL OR 17X26 10 PK STRL BLUE (TOWEL DISPOSABLE) ×3 IMPLANT
TRAY LAPAROSCOPIC MC (CUSTOM PROCEDURE TRAY) ×3 IMPLANT
TROCAR XCEL BLUNT TIP 100MML (ENDOMECHANICALS) ×3 IMPLANT
TROCAR XCEL NON-BLD 5MMX100MML (ENDOMECHANICALS) ×3 IMPLANT
TUBING INSUFFLATION (TUBING) ×3 IMPLANT

## 2015-01-28 NOTE — Progress Notes (Signed)
PROGRESS NOTE    VA BROADWELL KVQ:[id number] DOB: Oct 28, 1927 DOA: 01/27/2015 PCP: Tommy Rainwater, MD  HPI/Brief narrative 81 year old male with history of CAD, HTN, HLD, chronic hyponatremia (baseline sodium in the mid 120s), chronic thrombocytopenia, hospitalized August 2016 for acalculous cholecystitis, UTI, elevated troponin secondary to demand ischemia and had percutaneous cholecystostomy placed and discharged home on antibiotics. Since then cardiology cleared patient for surgery and patient arrived on 11/1 for elective cholecystectomy but was found to have sodium of 119. Surgery was canceled and patient was hospitalized for further management.   Assessment/Plan:  Hyponatremia, subacute on chronic - Baseline sodium in the mid 120s. Admitted with sodium of 119. - Treated with gentle IV fluid hydration and sodium has improved to 126. Patient is asymptomatic of this hyponatremia.  Acalculous cholecystitis - Patient had percutaneous cholecystostomy placed in August 2016. General surgery consultation and follow-up appreciated. Apparently has been cleared by cardiology for surgery and plan is for surgery this afternoon.  Chronic diastolic CHF - LVEF 40-45 percent by echo recently. - Compensated  Hyperlipidemia - Zocor  Essential hypertension - Controlled  History of aortic stenosis, status post TAVR 2015  PAD / PVD.  AAA / bilateral common iliac artery aneurysms. Not candidate for repair but stable in size at time of last follow up at with Vascular June 2016.   Hypothyroidism -continue Synthroid  Rheumatoid Arthritis  -on Methotrexate. Dose due Friday  GERD,  -continue daily PPI-start in am  CAD / CABG 1999.   Chronic anemia & thrombocytopenia,  - stable.    DVT prophylaxis: Subcutaneous heparin Code Status: Full Family Communication: None at bedside Disposition Plan: DC home when medically stable   Consultants:  General  surgery  Procedures:  Has percutaneous cholecystostomy tube from prior to admission  Antibiotics:  None   Subjective: Patient was seen this morning prior to surgery. Denied dyspnea, chest pain, abdominal pain, nausea or vomiting. "Hungry".  Objective: Filed Vitals:   01/28/15 0337 01/28/15 0607 01/28/15 1155 01/28/15 1241  BP:  125/58 148/53 167/67  Pulse:  67 68 76  Temp:  99.8 F (37.7 C) 98.6 F (37 C)   TempSrc:  Oral Oral   Resp:  16 18   Height:      Weight: 60.483 kg (133 lb 5.4 oz)     SpO2:  97% 97%     Intake/Output Summary (Last 24 hours) at 01/28/15 1557 Last data filed at 01/28/15 1551  Gross per 24 hour  Intake   1180 ml  Output   1167 ml  Net     13 ml   Filed Weights   01/27/15 1156 01/28/15 0337  Weight: 60.827 kg (134 lb 1.6 oz) 60.483 kg (133 lb 5.4 oz)     Exam:  General exam: Pleasant elderly male lying comfortably supine in bed. Respiratory system: Clear. No increased work of breathing. Cardiovascular system: S1 & S2 heard, RRR. No JVD, murmurs, gallops, clicks or pedal edema. Telemetry: Sinus rhythm with BBB morphology and occasional PVCs Gastrointestinal system: Abdomen is nondistended, soft and nontender. Normal bowel sounds heard. Central nervous system: Alert and oriented. No focal neurological deficits. Extremities: Symmetric 5 x 5 power.   Data Reviewed: Basic Metabolic Panel:  Recent Labs Lab 01/26/15 1552 01/27/15 0703 01/27/15 1532 01/28/15 0332  NA 122* 119* 125* 126*  K 4.0 3.9 4.2 3.8  CL 87* 84* 90* 92*  CO2 24 25 27 24   GLUCOSE 115* 110* 127* 104*  BUN 16 15 13  10  CREATININE 1.31* 1.13 0.84 0.81  CALCIUM 8.9 8.7* 8.6* 8.5*   Liver Function Tests:  Recent Labs Lab 01/26/15 1552  AST 28  ALT 13*  ALKPHOS 70  BILITOT 0.5  PROT 6.3*  ALBUMIN 3.7   No results for input(s): LIPASE, AMYLASE in the last 168 hours. No results for input(s): AMMONIA in the last 168 hours. CBC:  Recent Labs Lab  01/26/15 1552  WBC 6.6  NEUTROABS 3.2  HGB 11.2*  HCT 33.7*  MCV 85.1  PLT 122*   Cardiac Enzymes: No results for input(s): CKTOTAL, CKMB, CKMBINDEX, TROPONINI in the last 168 hours. BNP (last 3 results) No results for input(s): PROBNP in the last 8760 hours. CBG: No results for input(s): GLUCAP in the last 168 hours.  Recent Results (from the past 240 hour(s))  Surgical pcr screen     Status: None   Collection Time: 01/28/15 12:18 PM  Result Value Ref Range Status   MRSA, PCR NEGATIVE NEGATIVE Final   Staphylococcus aureus NEGATIVE NEGATIVE Final    Comment:        The Xpert SA Assay (FDA approved for NASAL specimens in patients over 82 years of age), is one component of a comprehensive surveillance program.  Test performance has been validated by [facility] for patients greater than or equal to 8 year old. It is not intended to diagnose infection nor to guide or monitor treatment.         Studies: No results found.      Scheduled Meds: . [MAR Hold] amLODipine  5 mg Oral Daily  . [MAR Hold] aspirin EC  81 mg Oral Daily  . [MAR Hold] heparin subcutaneous  5,000 Units Subcutaneous 3 times per day  . Influenza vac split quadrivalent PF  0.5 mL Intramuscular Tomorrow-1000  . [MAR Hold] levothyroxine  50 mcg Oral QAC breakfast  . [MAR Hold] methotrexate  25 mg Oral Q Thu  . [MAR Hold] metoprolol tartrate  25 mg Oral Daily  . [MAR Hold] pantoprazole  40 mg Oral Q1200  . [MAR Hold] simvastatin  20 mg Oral QPM  . [MAR Hold] sodium chloride  3 mL Intravenous Q12H   Continuous Infusions: . lactated ringers 10 mL/hr at 01/28/15 1226    Principal Problem:   Hyponatremia Active Problems:   Rheumatoid arthritis (HCC)   GERD (gastroesophageal reflux disease)   Thyroid disease   Coronary artery disease   S/P TAVR (transcatheter aortic valve replacement)   Thrombocytopenia (HCC)   Cholecystitis   Chronic cholecystitis   Chronic combined systolic and  diastolic congestive heart failure (HCC)   Pressure ulcer    Time spent: 20 minutes.    Marcellus Scott, MD, FACP, FHM. [facility] Pager [phone]  If 7PM-7AM, please contact night-coverage [email or web address] Password [facility] 01/28/2015, 3:57 PM

## 2015-01-28 NOTE — Progress Notes (Signed)
Patient came back from OR at 1815, alert but having confusion off and on , surgical sites are clean, dry, no bleeding. V/S stable. Will continue to monitor patient

## 2015-01-28 NOTE — Anesthesia Preprocedure Evaluation (Addendum)
Anesthesia Evaluation  Patient identified by MRN, date of birth, ID band Patient awake    Reviewed: Allergy & Precautions, NPO status , Patient's Chart, lab work & pertinent test results  History of Anesthesia Complications Negative for: history of anesthetic complications  Airway Mallampati: II  TM Distance: >3 FB Neck ROM: Full    Dental  (+) Missing, Dental Advisory Given, Poor Dentition, Chipped   Pulmonary COPD, former smoker,    breath sounds clear to auscultation       Cardiovascular hypertension, Pt. on medications and Pt. on home beta blockers (-) angina+ CAD, + Cardiac Stents, + CABG and + Peripheral Vascular Disease  + Valvular Problems/Murmurs (s/p TAVR) AS and AI  Rhythm:Regular Rate:Normal  10/16 ECHO: EF 40% to 45%. Diffuse hypokinesis, mild AI   Neuro/Psych Anxiety TIA   GI/Hepatic Neg liver ROS, GERD  Medicated and Controlled,  Endo/Other  Hypothyroidism   Renal/GU Renal InsufficiencyRenal disease (creat 1.31)     Musculoskeletal  (+) Arthritis , Rheumatoid disorders,    Abdominal   Peds  Hematology   Anesthesia Other Findings   Reproductive/Obstetrics                            Anesthesia Physical Anesthesia Plan  ASA: III  Anesthesia Plan: General   Post-op Pain Management:    Induction: Intravenous  Airway Management Planned: Oral ETT  Additional Equipment:   Intra-op Plan:   Post-operative Plan: Extubation in OR  Informed Consent: I have reviewed the patients History and Physical, chart, labs and discussed the procedure including the risks, benefits and alternatives for the proposed anesthesia with the patient or authorized representative who has indicated his/her understanding and acceptance.   Dental advisory given  Plan Discussed with: CRNA and Anesthesiologist  Anesthesia Plan Comments:         Anesthesia Quick Evaluation

## 2015-01-28 NOTE — Progress Notes (Signed)
AM EKG performed. Not visible in EPIC at this time. Strip placed in chart.

## 2015-01-28 NOTE — Progress Notes (Signed)
Patient ID: Hunter Choi, male   DOB: [birth year], 81 y.o.   MRN: [id number] 1 Day Post-Op  Subjective: Pt with no complaints.  Just had a BM  Objective: Vital signs in last 24 hours: Temp:  [97.7 F (36.5 C)-99.8 F (37.7 C)] 99.8 F (37.7 C) (11/02 0607) Pulse Rate:  [61-72] 67 (11/02 0607) Resp:  [16] 16 (11/02 0607) BP: (109-143)/(46-59) 125/58 mmHg (11/02 0607) SpO2:  [92 %-100 %] 97 % (11/02 0607) Weight:  [60.483 kg (133 lb 5.4 oz)-60.827 kg (134 lb 1.6 oz)] 60.483 kg (133 lb 5.4 oz) (11/02 0337) Last BM Date: 01/27/15  Intake/Output from previous day: 11/01 0701 - 11/02 0700 In: 720 [P.O.:720] Out: 601 [Urine:600; Stool:1] Intake/Output this shift:    PE: Abd: soft, NT, Nd, +BS, perc chole drain in place  Lab Results:   Recent Labs  01/26/15 1552  WBC 6.6  HGB 11.2*  HCT 33.7*  PLT 122*   BMET  Recent Labs  01/27/15 1532 01/28/15 0332  NA 125* 126*  K 4.2 3.8  CL 90* 92*  CO2 27 24  GLUCOSE 127* 104*  BUN 13 10  CREATININE 0.84 0.81  CALCIUM 8.6* 8.5*   PT/INR No results for input(s): LABPROT, INR in the last 72 hours. CMP     Component Value Date/Time   NA 126* 01/28/2015 0332   K 3.8 01/28/2015 0332   CL 92* 01/28/2015 0332   CO2 24 01/28/2015 0332   GLUCOSE 104* 01/28/2015 0332   BUN 10 01/28/2015 0332   CREATININE 0.81 01/28/2015 0332   CALCIUM 8.5* 01/28/2015 0332   PROT 6.3* 01/26/2015 1552   ALBUMIN 3.7 01/26/2015 1552   AST 28 01/26/2015 1552   ALT 13* 01/26/2015 1552   ALKPHOS 70 01/26/2015 1552   BILITOT 0.5 01/26/2015 1552   GFRNONAA >60 01/28/2015 0332   GFRAA >60 01/28/2015 0332   Lipase     Component Value Date/Time   LIPASE 43 11/14/2014 1730       Studies/Results: No results found.  Anti-infectives: Anti-infectives    Start     Dose/Rate Route Frequency Ordered Stop   01/28/15 0800  cefTRIAXone (ROCEPHIN) 2 g in dextrose 5 % 50 mL IVPB     2 g 100 mL/hr over 30 Minutes Intravenous On call to O.R.  01/28/15 0753 01/29/15 0559   01/27/15 0700  cefoTEtan in Dextrose 5% (CEFOTAN) IVPB 2 g  Status:  Discontinued     2 g Intravenous To [facility] 01/26/15 1352 01/27/15 1152       Assessment/Plan  1. S/p perc chole drain -plan for OR today as Na now 126. -NPO -abx on call to OR -hold heparin today -patient somewhat unsteady on his feet, will likely need PT post op 2. Hyponatremia -unknown cause, but improved this am  3. MMP -per primary service     Jerelyn Trimarco E 01/28/2015, 7:54 AM Pager: [phone]

## 2015-01-28 NOTE — Op Note (Signed)
Hunter Choi [id number] [birth year] 01/28/2015  Laparoscopic Cholecystectomy with IOC Procedure Note  Indications: This patient presents with symptomatic gallbladder disease and will undergo laparoscopic cholecystectomy. He had presented in August with urosepsis along with cholelithiasis and cholecystitis and a small elevation in his troponin. It was felt that he would be initially best managed with a percutaneous cholecystostomy tube. He has recovered. He underwent a drain study through his cholecystostomy tube which showed cystic duct obstruction. Because of this I felt his risk of recurrent cholecystitis was increased if we were to remove the drain without offering a cholecystectomy. He was cleared by cardiology. We have discussed at length the risk and benefits of the procedure please see my office note for additional details. He was initially scheduled for surgery yesterday however his sodium on presentation was 119. He does have somewhat of a chronic hyponatremia in the mid 120s. Surgery was canceled and he was admitted to the medical service. His sodium normalized to his baseline level and he was felt stable for surgery this afternoon.  Pre-operative Diagnosis: Calculus of gallbladder with other cholecystitis, without mention of obstruction  Post-operative Diagnosis: Same  Surgeon: Atilano Ina   Assistants: Maryelizabeth Kaufmann, PA-C  Anesthesia: General endotracheal anesthesia  ASA Class: 3  Procedure Details  The patient was seen again in the Holding Room. The risks, benefits, complications, treatment options, and expected outcomes were discussed with the patient. The possibilities of reaction to medication, pulmonary aspiration, perforation of viscus, bleeding, recurrent infection, finding a normal gallbladder, the need for additional procedures, failure to diagnose a condition, the possible need to convert to an open procedure, and creating a complication requiring transfusion or operation  were discussed with the patient. The likelihood of improving the patient's symptoms with return to their baseline status is good.  The patient and/or family concurred with the proposed plan, giving informed consent. The site of surgery properly noted. The patient was taken to Operating Room, identified as [name] and the procedure verified as Laparoscopic Cholecystectomy with Intraoperative Cholangiogram. A Time Out was held and the above information confirmed. Antibiotic prophylaxis was administered.   Prior to the induction of general anesthesia, antibiotic prophylaxis was administered. General endotracheal anesthesia was then administered and tolerated well. After the induction, the abdomen was prepped with Chloraprep and draped in the sterile fashion. The patient was positioned in the supine position. The cholecystostomy tube was draped outside the field.  Local anesthetic agent was injected into the skin near the umbilicus and an incision made. We dissected down to the abdominal fascia with blunt dissection.  The fascia was incised vertically and we entered the peritoneal cavity bluntly.  A pursestring suture of 0-Vicryl was placed around the fascial opening.  The Hasson cannula was inserted and secured with the stay suture.  Pneumoperitoneum was then created with CO2 and tolerated well without any adverse changes in the patient's vital signs. An 5-mm port was placed in the subxiphoid position.  Two 5-mm ports were placed in the right upper quadrant. All skin incisions were infiltrated with a local anesthetic agent before making the incision and placing the trocars.   We positioned the patient in reverse Trendelenburg, tilted slightly to the patient's left. His cholecystostomy tube was coming through the abdominal wall through the omentum near the transverse colon. In order to retract the gallbladder I went ahead and transected the cholecystostomy tube and pulled it back through the omentum so that  it was just now coming out through the gallbladder.  The gallbladder was identified, the fundus grasped and retracted cephalad. Adhesions were lysed bluntly and with the electrocautery where indicated, taking care not to injure any adjacent organs or viscus. The infundibulum was grasped and retracted laterally, exposing the peritoneum overlying the triangle of Calot. This was then divided and exposed in a blunt fashion. A critical view of the cystic duct and cystic artery was obtained.  The cystic duct was clearly identified and bluntly dissected circumferentially. The cystic duct was ligated with a clip distally.   An incision was made in the cystic duct and the [facility] cholangiogram catheter introduced. The catheter was secured using a clip. A cholangiogram was then obtained which showed good visualization of the distal and proximal biliary tree with no sign of filling defects or obstruction.  Contrast flowed easily into the duodenum. The catheter was then removed.   The cystic duct was then ligated with clips and divided. The cystic artery which had been identified and dissected free was ligated with clips and divided as well.   The gallbladder was dissected from the liver bed in retrograde fashion with the electrocautery. The gallbladder was removed and placed in an Ecco sac.  The gallbladder and Ecco sac were then removed through the umbilical port site. The liver bed was irrigated and inspected. Hemostasis was achieved with the electrocautery. Copious irrigation was utilized and was repeatedly aspirated until clear.  The pursestring suture was used to close the umbilical fascia.  An additional interrupted 0 Vicryl sutures placed in the umbilical fascia  We again inspected the right upper quadrant for hemostasis.  The umbilical closure was inspected and there was no air leak and nothing trapped within the closure. Pneumoperitoneum was released as we removed the trocars.  4-0 Monocryl was used to close the  skin.   Benzoin, steri-strips, and clean dressings were applied. The patient was then extubated and brought to the recovery room in stable condition. Instrument, sponge, and needle counts were correct at closure and at the conclusion of the case.   Findings: Chronic Cholecystitis with Cholelithiasis  Estimated Blood Loss: Minimal         Drains: none         Specimens: Gallbladder           Complications: None; patient tolerated the procedure well.         Disposition: PACU - hemodynamically stable.         Condition: stable  Mary Sella. Andrey Campanile, MD, FACS General, Bariatric, & Minimally Invasive Surgery [facility], [place]

## 2015-01-28 NOTE — Transfer of Care (Signed)
Immediate Anesthesia Transfer of Care Note  Patient: Hunter Choi  Procedure(s) Performed: Procedure(s): LAPAROSCOPIC CHOLECYSTECTOMY WITH INTRAOPERATIVE CHOLANGIOGRAM (N/A)  Patient Location: PACU  Anesthesia Type:General  Level of Consciousness: awake, alert , oriented and patient cooperative  Airway & Oxygen Therapy: Patient Spontanous Breathing and Patient connected to nasal cannula oxygen  Post-op Assessment: Report given to RN, Post -op Vital signs reviewed and stable and Patient moving all extremities  Post vital signs: Reviewed and stable  Last Vitals:  BP 162/92 HR 67 RR 16 SpO2 94% on 4L [place] Resting comfortably, maintains good airway, denies pain.   Complications: No apparent anesthesia complications

## 2015-01-28 NOTE — Anesthesia Procedure Notes (Signed)
Procedure Name: Intubation Date/Time: 01/28/2015 2:30 PM Performed by: Roney Mans P Pre-anesthesia Checklist: Patient identified, Timeout performed, Emergency Drugs available, Suction available and Patient being monitored Patient Re-evaluated:Patient Re-evaluated prior to inductionOxygen Delivery Method: Circle system utilized Preoxygenation: Pre-oxygenation with 100% oxygen Intubation Type: IV induction Ventilation: Mask ventilation without difficulty Laryngoscope Size: Mac and 4 Grade View: Grade I Tube type: Oral Tube size: 7.5 mm Number of attempts: 1 Airway Equipment and Method: Stylet Placement Confirmation: ETT inserted through vocal cords under direct vision,  breath sounds checked- equal and bilateral and positive ETCO2 Secured at: 23 cm Tube secured with: Tape Dental Injury: Teeth and Oropharynx as per pre-operative assessment

## 2015-01-29 ENCOUNTER — Encounter (HOSPITAL_COMMUNITY): Payer: Self-pay | Admitting: General Surgery

## 2015-01-29 DIAGNOSIS — Z9049 Acquired absence of other specified parts of digestive tract: Secondary | ICD-10-CM | POA: Diagnosis not present

## 2015-01-29 DIAGNOSIS — I472 Ventricular tachycardia: Secondary | ICD-10-CM | POA: Diagnosis not present

## 2015-01-29 DIAGNOSIS — I739 Peripheral vascular disease, unspecified: Secondary | ICD-10-CM | POA: Diagnosis not present

## 2015-01-29 DIAGNOSIS — I429 Cardiomyopathy, unspecified: Secondary | ICD-10-CM | POA: Diagnosis not present

## 2015-01-29 DIAGNOSIS — I110 Hypertensive heart disease with heart failure: Secondary | ICD-10-CM | POA: Diagnosis not present

## 2015-01-29 DIAGNOSIS — K219 Gastro-esophageal reflux disease without esophagitis: Secondary | ICD-10-CM | POA: Diagnosis not present

## 2015-01-29 DIAGNOSIS — E871 Hypo-osmolality and hyponatremia: Secondary | ICD-10-CM | POA: Diagnosis present

## 2015-01-29 DIAGNOSIS — Z952 Presence of prosthetic heart valve: Secondary | ICD-10-CM | POA: Diagnosis not present

## 2015-01-29 DIAGNOSIS — Z8673 Personal history of transient ischemic attack (TIA), and cerebral infarction without residual deficits: Secondary | ICD-10-CM | POA: Diagnosis not present

## 2015-01-29 DIAGNOSIS — I5042 Chronic combined systolic (congestive) and diastolic (congestive) heart failure: Secondary | ICD-10-CM | POA: Diagnosis not present

## 2015-01-29 DIAGNOSIS — K8010 Calculus of gallbladder with chronic cholecystitis without obstruction: Secondary | ICD-10-CM | POA: Diagnosis not present

## 2015-01-29 DIAGNOSIS — E222 Syndrome of inappropriate secretion of antidiuretic hormone: Secondary | ICD-10-CM | POA: Diagnosis not present

## 2015-01-29 DIAGNOSIS — I2510 Atherosclerotic heart disease of native coronary artery without angina pectoris: Secondary | ICD-10-CM | POA: Diagnosis not present

## 2015-01-29 DIAGNOSIS — I714 Abdominal aortic aneurysm, without rupture: Secondary | ICD-10-CM | POA: Diagnosis not present

## 2015-01-29 DIAGNOSIS — Z7982 Long term (current) use of aspirin: Secondary | ICD-10-CM | POA: Diagnosis not present

## 2015-01-29 DIAGNOSIS — K811 Chronic cholecystitis: Secondary | ICD-10-CM | POA: Diagnosis not present

## 2015-01-29 DIAGNOSIS — D509 Iron deficiency anemia, unspecified: Secondary | ICD-10-CM | POA: Diagnosis not present

## 2015-01-29 DIAGNOSIS — N400 Enlarged prostate without lower urinary tract symptoms: Secondary | ICD-10-CM | POA: Diagnosis not present

## 2015-01-29 DIAGNOSIS — D696 Thrombocytopenia, unspecified: Secondary | ICD-10-CM | POA: Diagnosis not present

## 2015-01-29 DIAGNOSIS — E039 Hypothyroidism, unspecified: Secondary | ICD-10-CM | POA: Diagnosis not present

## 2015-01-29 DIAGNOSIS — Z96653 Presence of artificial knee joint, bilateral: Secondary | ICD-10-CM | POA: Diagnosis not present

## 2015-01-29 DIAGNOSIS — I25810 Atherosclerosis of coronary artery bypass graft(s) without angina pectoris: Secondary | ICD-10-CM | POA: Diagnosis not present

## 2015-01-29 DIAGNOSIS — I723 Aneurysm of iliac artery: Secondary | ICD-10-CM | POA: Diagnosis not present

## 2015-01-29 DIAGNOSIS — M069 Rheumatoid arthritis, unspecified: Secondary | ICD-10-CM | POA: Diagnosis not present

## 2015-01-29 DIAGNOSIS — E785 Hyperlipidemia, unspecified: Secondary | ICD-10-CM | POA: Diagnosis not present

## 2015-01-29 LAB — COMPREHENSIVE METABOLIC PANEL
ALK PHOS: 61 U/L (ref 38–126)
ALT: 20 U/L (ref 17–63)
AST: 43 U/L — ABNORMAL HIGH (ref 15–41)
Albumin: 3.2 g/dL — ABNORMAL LOW (ref 3.5–5.0)
Anion gap: 9 (ref 5–15)
BILIRUBIN TOTAL: 0.4 mg/dL (ref 0.3–1.2)
BUN: 8 mg/dL (ref 6–20)
CALCIUM: 8.1 mg/dL — AB (ref 8.9–10.3)
CO2: 24 mmol/L (ref 22–32)
CREATININE: 0.79 mg/dL (ref 0.61–1.24)
Chloride: 94 mmol/L — ABNORMAL LOW (ref 101–111)
Glucose, Bld: 118 mg/dL — ABNORMAL HIGH (ref 65–99)
Potassium: 3.8 mmol/L (ref 3.5–5.1)
Sodium: 127 mmol/L — ABNORMAL LOW (ref 135–145)
TOTAL PROTEIN: 6.1 g/dL — AB (ref 6.5–8.1)

## 2015-01-29 LAB — CBC
HEMATOCRIT: 32.5 % — AB (ref 39.0–52.0)
HEMOGLOBIN: 10.7 g/dL — AB (ref 13.0–17.0)
MCH: 28.9 pg (ref 26.0–34.0)
MCHC: 32.9 g/dL (ref 30.0–36.0)
MCV: 87.8 fL (ref 78.0–100.0)
PLATELETS: 103 10*3/uL — AB (ref 150–400)
RBC: 3.7 MIL/uL — AB (ref 4.22–5.81)
RDW: 17.3 % — ABNORMAL HIGH (ref 11.5–15.5)
WBC: 7.3 10*3/uL (ref 4.0–10.5)

## 2015-01-29 LAB — MAGNESIUM: MAGNESIUM: 1.4 mg/dL — AB (ref 1.7–2.4)

## 2015-01-29 MED ORDER — METOPROLOL TARTRATE 25 MG PO TABS
25.0000 mg | ORAL_TABLET | Freq: Two times a day (BID) | ORAL | Status: DC
  Administered 2015-01-29 – 2015-02-03 (×10): 25 mg via ORAL
  Filled 2015-01-29 (×10): qty 1

## 2015-01-29 MED ORDER — POTASSIUM CHLORIDE CRYS ER 20 MEQ PO TBCR
40.0000 meq | EXTENDED_RELEASE_TABLET | Freq: Once | ORAL | Status: AC
  Administered 2015-01-29: 40 meq via ORAL
  Filled 2015-01-29: qty 2

## 2015-01-29 MED ORDER — SODIUM CHLORIDE 0.9 % IV SOLN
INTRAVENOUS | Status: DC
  Administered 2015-01-29 – 2015-01-30 (×3): via INTRAVENOUS

## 2015-01-29 MED ORDER — ACETAMINOPHEN 325 MG PO TABS: 650.0000 mg | ORAL_TABLET | Freq: Four times a day (QID) | ORAL | Status: DC | PRN

## 2015-01-29 MED ORDER — LISINOPRIL 2.5 MG PO TABS
2.5000 mg | ORAL_TABLET | Freq: Every day | ORAL | Status: DC
  Administered 2015-01-29 – 2015-02-03 (×6): 2.5 mg via ORAL
  Filled 2015-01-29 (×6): qty 1

## 2015-01-29 NOTE — Evaluation (Signed)
Physical Therapy Evaluation Patient Details Name: Hunter Choi MRN: [id number] DOB: 1927/08/11 Today's Date: 01/29/2015   History of Present Illness  pt is an 81 y/o male with h/o CAD, HTN, AAA, Stroke, afib, recently discharged with perc. drain for acalculous cholecystitis.  Now admitted for cholecystectomy, but deferred due to hyponatremia.  11/02, pt s/p lap chole.  Clinical Impression  Pt admitted with/for elective chole.  Pt currently limited functionally due to the problems listed below.  (see problems list.)  Pt will benefit from PT to maximize function and safety to be able to get home safely with available assist of family.     Follow Up Recommendations No PT follow up    Equipment Recommendations  None recommended by PT    Recommendations for Other Services       Precautions / Restrictions Precautions Precautions: Fall      Mobility  Bed Mobility Overal bed mobility: Needs Assistance Bed Mobility: Supine to Sit     Supine to sit: Min assist     General bed mobility comments: cued pt to roll to get up from side, but he positioned himself to come straight up.  Transfers Overall transfer level: Needs assistance   Transfers: Sit to/from Stand Sit to Stand: Min guard         General transfer comment: cues for hand placement  Ambulation/Gait Ambulation/Gait assistance: Min guard Ambulation Distance (Feet): 28 Feet Assistive device: Rolling walker (2 wheeled) Gait Pattern/deviations: Step-through pattern;Decreased step length - right;Decreased step length - left;Decreased stride length Gait velocity: slow and guarded   General Gait Details: unsteady gait with RW.  Needed assist to maneuver RW safely in a crowded room.  Stairs            Wheelchair Mobility    Modified Rankin (Stroke Patients Only)       Balance Overall balance assessment: Needs assistance Sitting-balance support: No upper extremity supported Sitting balance-Leahy Scale:  Fair     Standing balance support: No upper extremity supported Standing balance-Leahy Scale: Poor Standing balance comment: reliant on the rw                             Pertinent Vitals/Pain Pain Assessment: Faces Faces Pain Scale: Hurts little more Pain Location: abdominal incision Pain Descriptors / Indicators: Grimacing;Operative site guarding Pain Intervention(s): Monitored during session;Repositioned    Home Living Family/patient expects to be discharged to:: Private residence Living Arrangements: Spouse/significant other;Children Available Help at Discharge: Family;Available 24 hours/day Type of Home: House Home Access: Stairs to enter Entrance Stairs-Rails: Doctor, general practice of Steps: 3 Home Layout: One level Home Equipment: Walker - 4 wheels;Cane - single point;Shower seat      Prior Function Level of Independence: Independent with assistive device(s)         Comments: PTA pt used walker at all times, denies need for assist w/ ambulation and ADLs     Hand Dominance        Extremity/Trunk Assessment   Upper Extremity Assessment: Defer to OT evaluation (functional UE assist, fingers ulnar drift)           Lower Extremity Assessment: Overall WFL for tasks assessed;Generalized weakness (proximal weakness and truncal weakness)         Communication   Communication: HOH  Cognition Arousal/Alertness: Awake/alert Behavior During Therapy: WFL for tasks assessed/performed Overall Cognitive Status: Difficult to assess  General Comments      Exercises        Assessment/Plan    PT Assessment Patient needs continued PT services  PT Diagnosis Difficulty walking;Generalized weakness   PT Problem List Decreased strength;Decreased activity tolerance;Decreased balance;Decreased mobility;Decreased knowledge of use of DME  PT Treatment Interventions DME instruction;Gait training;Stair  training;Functional mobility training;Therapeutic activities;Balance training;Patient/family education   PT Goals (Current goals can be found in the Care Plan section) Acute Rehab PT Goals PT Goal Formulation: With patient Time For Goal Achievement: 02/12/15 Potential to Achieve Goals: Good    Frequency Min 3X/week   Barriers to discharge        Co-evaluation               End of Session   Activity Tolerance: Patient tolerated treatment well Patient left: in chair;with call bell/phone within reach;with chair alarm set Nurse Communication: Mobility status    Functional Assessment Tool Used: clinical judgement Functional Limitation: Mobility: Walking and moving around Mobility: Walking and Moving Around Current Status (J6967): At least 1 percent but less than 20 percent impaired, limited or restricted Mobility: Walking and Moving Around Goal Status [phone]): At least 1 percent but less than 20 percent impaired, limited or restricted    Time: 1715-1745 PT Time Calculation (min) (ACUTE ONLY): 30 min   Charges:   PT Evaluation $Initial PT Evaluation Tier I: 1 Procedure PT Treatments $Gait Training: 8-22 mins   PT G Codes:   PT G-Codes **NOT FOR INPATIENT CLASS** Functional Assessment Tool Used: clinical judgement Functional Limitation: Mobility: Walking and moving around Mobility: Walking and Moving Around Current Status (O1751): At least 1 percent but less than 20 percent impaired, limited or restricted Mobility: Walking and Moving Around Goal Status [phone]): At least 1 percent but less than 20 percent impaired, limited or restricted    Maurie Musco, Eliseo Gum 01/29/2015, 6:03 PM 01/29/2015  Lynnwood Bing, PT [phone]  (pager)

## 2015-01-29 NOTE — Progress Notes (Addendum)
PROGRESS NOTE    Hunter Choi:[id number] DOB: 1927-07-21 DOA: 01/27/2015 PCP: Tommy Rainwater, MD  HPI/Brief narrative 81 year old male with history of CAD, HTN, HLD, chronic hyponatremia (baseline sodium in the mid 120s), chronic thrombocytopenia, hospitalized August 2016 for acalculous cholecystitis, UTI, elevated troponin secondary to demand ischemia and had percutaneous cholecystostomy placed and discharged home on antibiotics. Since then cardiology cleared patient for surgery and patient arrived on 11/1 for elective cholecystectomy but was found to have sodium of 119. Surgery was canceled and patient was hospitalized for further management.   Assessment/Plan:  Hyponatremia, subacute on chronic - Baseline sodium in the mid 120s. Admitted with sodium of 119. - Treated with gentle IV fluid hydration and sodium has improved to 127.  - Patient is asymptomatic of this hyponatremia.  Acalculous cholecystitis - Patient had percutaneous cholecystostomy placed in August 2016.  - Status post laparoscopic cholecystectomy 11/2. Management per general surgery.  Chronic combined systolic & diastolic CHF - LVEF 40-45 percent by echo recently. - Compensated  Cardiomyopathy/NSVT - 2-D echo 01/07/15: LVEF 40-45 percent, diffuse hypokinesis and grade 1 diastolic dysfunction. Bioprosthetic aortic valve. Mild MR. EF was unchanged compared to echo 01/13/14 - Replace potassium >4 and magnesium >2 - Add lisinopril 2.5 MG daily. Increase metoprolol to 25 mg PO BID - Monitor closely and if has recurrence, consider cardiology consultation.  Hyperlipidemia - Zocor  Essential hypertension - Controlled  History of aortic stenosis, status post TAVR 2015  PAD / PVD.  AAA / bilateral common iliac artery aneurysms. Not candidate for repair but stable in size at time of last follow up at with Vascular June 2016.   Hypothyroidism -continue Synthroid  Rheumatoid Arthritis  -on  Methotrexate. Dose due Friday  GERD,  -continue daily PPI-start in am  CAD / CABG 1999.   Chronic anemia & thrombocytopenia,  - stable.    DVT prophylaxis: Subcutaneous heparin Code Status: Full Family Communication: None at bedside Disposition Plan: DC home when medically stable   Consultants:  General surgery  Procedures:  Has percutaneous cholecystostomy tube from prior to admission  Laparoscopic Cholecystectomy with IOC Procedure Note 11/2  Antibiotics:  None   Subjective: Abdomen sore at surgical site but otherwise no complaints.  Objective: Filed Vitals:   01/29/15 0526 01/29/15 0957 01/29/15 1157 01/29/15 1342  BP: 141/61 159/59 119/45 125/54  Pulse: 66 66 68 57  Temp: 99.5 F (37.5 C)  98.4 F (36.9 C)   TempSrc: Oral  Oral   Resp: 18     Height:      Weight: 61.372 kg (135 lb 4.8 oz)     SpO2: 100%  94%     Intake/Output Summary (Last 24 hours) at 01/29/15 1510 Last data filed at 01/29/15 1258  Gross per 24 hour  Intake   1320 ml  Output   1390 ml  Net    -70 ml   Filed Weights   01/27/15 1156 01/28/15 0337 01/29/15 0526  Weight: 60.827 kg (134 lb 1.6 oz) 60.483 kg (133 lb 5.4 oz) 61.372 kg (135 lb 4.8 oz)     Exam:  General exam: Pleasant elderly male lying comfortably supine in bed. Respiratory system: Clear. No increased work of breathing. Cardiovascular system: S1 & S2 heard, RRR. No JVD, murmurs, gallops, clicks or pedal edema. Telemetry: Sinus rhythm with BBB morphology and occasional PVCs. Subsequently RN reported 6 beats of NSVT-asymptomatic Gastrointestinal system: Abdomen is nondistended, soft and nontender. Normal bowel sounds heard. Laparoscopic sites without acute  findings. Central nervous system: Alert and oriented. No focal neurological deficits. Extremities: Symmetric 5 x 5 power.   Data Reviewed: Basic Metabolic Panel:  Recent Labs Lab 01/26/15 1552 01/27/15 0703 01/27/15 1532 01/28/15 0332 01/29/15 0433  NA  122* 119* 125* 126* 127*  K 4.0 3.9 4.2 3.8 3.8  CL 87* 84* 90* 92* 94*  CO2 24 25 27 24 24   GLUCOSE 115* 110* 127* 104* 118*  BUN 16 15 13 10 8   CREATININE 1.31* 1.13 0.84 0.81 0.79  CALCIUM 8.9 8.7* 8.6* 8.5* 8.1*   Liver Function Tests:  Recent Labs Lab 01/26/15 1552 01/29/15 0433  AST 28 43*  ALT 13* 20  ALKPHOS 70 61  BILITOT 0.5 0.4  PROT 6.3* 6.1*  ALBUMIN 3.7 3.2*   No results for input(s): LIPASE, AMYLASE in the last 168 hours. No results for input(s): AMMONIA in the last 168 hours. CBC:  Recent Labs Lab 01/26/15 1552 01/29/15 0433  WBC 6.6 7.3  NEUTROABS 3.2  --   HGB 11.2* 10.7*  HCT 33.7* 32.5*  MCV 85.1 87.8  PLT 122* 103*   Cardiac Enzymes: No results for input(s): CKTOTAL, CKMB, CKMBINDEX, TROPONINI in the last 168 hours. BNP (last 3 results) No results for input(s): PROBNP in the last 8760 hours. CBG: No results for input(s): GLUCAP in the last 168 hours.  Recent Results (from the past 240 hour(s))  Surgical pcr screen     Status: None   Collection Time: 01/28/15 12:18 PM  Result Value Ref Range Status   MRSA, PCR NEGATIVE NEGATIVE Final   Staphylococcus aureus NEGATIVE NEGATIVE Final    Comment:        The Xpert SA Assay (FDA approved for NASAL specimens in patients over 51 years of age), is one component of a comprehensive surveillance program.  Test performance has been validated by [facility] for patients greater than or equal to 43 year old. It is not intended to diagnose infection nor to guide or monitor treatment.         Studies: Dg Cholangiogram Operative  01/28/2015  CLINICAL DATA:  Old male with a history of cholelithiasis. EXAM: INTRAOPERATIVE CHOLANGIOGRAM TECHNIQUE: Cholangiographic images from the C-arm fluoroscopic device were submitted for interpretation post-operatively. Please see the procedural report for the amount of contrast and the fluoroscopy time utilized. COMPARISON:  01/01/2015 FINDINGS: Surgical  instruments project over the upper abdomen. Distal aspect a pigtail catheter projects in the region of the gallbladder fossa. This has been fractured in the mid drain. There is cannulation of the cystic duct/gallbladder neck, with antegrade infusion of contrast. Caliber of the extrahepatic ductal system within normal limits. No large filling defect identified. Free flow of contrast across the ampulla. IMPRESSION: Intraoperative cholangiogram demonstrates extrahepatic biliary ducts of unremarkable caliber, with no large filling defect identified. Free flow of contrast across the ampulla. Retained foreign body, with the distal aspect at pigtail catheter projecting in the region of the gallbladder fossa. Correlation with gross pathology recommended, to assure that this is removed with the gallbladder. Please refer to the dictated operative report for full details of intraoperative findings and procedure Signed, 13/04/2014. 03/03/2015, DO [facility] Electronically Signed   By: Loreta Ave D.O.   On: 01/28/2015 15:55        Scheduled Meds: . acetaminophen  1,000 mg Oral Q6H  . amLODipine  5 mg Oral Daily  . aspirin EC  81 mg Oral Daily  . heparin subcutaneous  5,000 Units Subcutaneous 3 times per day  . levothyroxine  50 mcg Oral QAC breakfast  . methotrexate  25 mg Oral Q Thu  . metoprolol tartrate  25 mg Oral Daily  . pantoprazole  40 mg Oral Q1200  . simvastatin  20 mg Oral QPM  . sodium chloride  3 mL Intravenous Q12H   Continuous Infusions: . sodium chloride 75 mL/hr at 01/29/15 1342    Principal Problem:   Hyponatremia Active Problems:   Rheumatoid arthritis (HCC)   GERD (gastroesophageal reflux disease)   Thyroid disease   Coronary artery disease   S/P TAVR (transcatheter aortic valve replacement)   Thrombocytopenia (HCC)   Cholecystitis   Chronic cholecystitis   Chronic combined systolic and diastolic congestive heart failure  (HCC)   Pressure ulcer    Time spent: 20 minutes.    Marcellus Scott, MD, FACP, FHM. [facility] Pager [phone]  If 7PM-7AM, please contact night-coverage [email or web address] Password [email or web address] 01/29/2015, 3:10 PM

## 2015-01-29 NOTE — Progress Notes (Signed)
CCMD called to notify that pt had a 6 beat run of V-tach.  VSS; pt is asymptomatic and resting.  MD notified.  Heart strip is in epic.

## 2015-01-29 NOTE — Progress Notes (Signed)
Patient ID: Hunter Choi, male   DOB: [birth year], 81 y.o.   MRN: [id number] 1 Day Post-Op  Subjective: Pt very sleepy, but not complaining of much pain.  No nausea  Objective: Vital signs in last 24 hours: Temp:  [97.9 F (36.6 C)-99.5 F (37.5 C)] 99.5 F (37.5 C) (11/03 0526) Pulse Rate:  [51-76] 66 (11/03 0526) Resp:  [14-19] 18 (11/03 0526) BP: (96-167)/(46-92) 141/61 mmHg (11/03 0526) SpO2:  [97 %-100 %] 100 % (11/03 0526) Weight:  [61.372 kg (135 lb 4.8 oz)] 61.372 kg (135 lb 4.8 oz) (11/03 0526) Last BM Date: 01/28/15  Intake/Output from previous day: 11/02 0701 - 11/03 0700 In: 1225 [P.O.:300; I.V.:925] Out: 1591 [Urine:1575; Stool:1; Blood:15] Intake/Output this shift:    PE: Abd: soft, minimally tender, +BS, Nd, incisions c/d/i  Lab Results:   Recent Labs  01/26/15 1552 01/29/15 0433  WBC 6.6 7.3  HGB 11.2* 10.7*  HCT 33.7* 32.5*  PLT 122* 103*   BMET  Recent Labs  01/28/15 0332 01/29/15 0433  NA 126* 127*  K 3.8 3.8  CL 92* 94*  CO2 24 24  GLUCOSE 104* 118*  BUN 10 8  CREATININE 0.81 0.79  CALCIUM 8.5* 8.1*   PT/INR No results for input(s): LABPROT, INR in the last 72 hours. CMP     Component Value Date/Time   NA 127* 01/29/2015 0433   K 3.8 01/29/2015 0433   CL 94* 01/29/2015 0433   CO2 24 01/29/2015 0433   GLUCOSE 118* 01/29/2015 0433   BUN 8 01/29/2015 0433   CREATININE 0.79 01/29/2015 0433   CALCIUM 8.1* 01/29/2015 0433   PROT 6.1* 01/29/2015 0433   ALBUMIN 3.2* 01/29/2015 0433   AST 43* 01/29/2015 0433   ALT 20 01/29/2015 0433   ALKPHOS 61 01/29/2015 0433   BILITOT 0.4 01/29/2015 0433   GFRNONAA >60 01/29/2015 0433   GFRAA >60 01/29/2015 0433   Lipase     Component Value Date/Time   LIPASE 43 11/14/2014 1730       Studies/Results: Dg Cholangiogram Operative  01/28/2015  CLINICAL DATA:  Old male with a history of cholelithiasis. EXAM: INTRAOPERATIVE CHOLANGIOGRAM TECHNIQUE: Cholangiographic images from the C-arm  fluoroscopic device were submitted for interpretation post-operatively. Please see the procedural report for the amount of contrast and the fluoroscopy time utilized. COMPARISON:  01/01/2015 FINDINGS: Surgical instruments project over the upper abdomen. Distal aspect a pigtail catheter projects in the region of the gallbladder fossa. This has been fractured in the mid drain. There is cannulation of the cystic duct/gallbladder neck, with antegrade infusion of contrast. Caliber of the extrahepatic ductal system within normal limits. No large filling defect identified. Free flow of contrast across the ampulla. IMPRESSION: Intraoperative cholangiogram demonstrates extrahepatic biliary ducts of unremarkable caliber, with no large filling defect identified. Free flow of contrast across the ampulla. Retained foreign body, with the distal aspect at pigtail catheter projecting in the region of the gallbladder fossa. Correlation with gross pathology recommended, to assure that this is removed with the gallbladder. Please refer to the dictated operative report for full details of intraoperative findings and procedure Signed, Yvone Neu. Loreta Ave, DO [facility] Radiology Electronically Signed   By: Gilmer Mor D.O.   On: 01/28/2015 15:55    Anti-infectives: Anti-infectives    Start     Dose/Rate Route Frequency Ordered Stop   01/28/15 1240  [MAR Hold]  cefTRIAXone (ROCEPHIN) 2 g in dextrose 5 % 50 mL IVPB     (  MAR Hold since 01/28/15 1212)   2 g 100 mL/hr over 30 Minutes Intravenous To ShortStay Surgical 01/28/15 0753 01/28/15 1440   01/27/15 0700  cefoTEtan in Dextrose 5% (CEFOTAN) IVPB 2 g  Status:  Discontinued     2 g Intravenous To ShortStay Surgical 01/26/15 1352 01/27/15 1152       Assessment/Plan  1. POD 1, s/p lap chole after perc drain -patient doing well right now -advance diet to soft diet -PT eval pending for DC recommendations -otherwise surgically  stable 2. Hyponatremia -Na+ 127 this am      Maidie Streight E 01/29/2015, 8:07 AM Pager: [phone]

## 2015-01-29 NOTE — Care Management Obs Status (Signed)
MEDICARE OBSERVATION STATUS NOTIFICATION   Patient Details  Name: Hunter Choi MRN: [id number] Date of Birth: Sep 09, 1927   Medicare Observation Status Notification Given:  Yes    Cherrie Distance, RN 01/29/2015, 1:50 PM

## 2015-01-30 DIAGNOSIS — E871 Hypo-osmolality and hyponatremia: Secondary | ICD-10-CM | POA: Diagnosis not present

## 2015-01-30 LAB — BASIC METABOLIC PANEL
ANION GAP: 8 (ref 5–15)
BUN: 9 mg/dL (ref 6–20)
CALCIUM: 8 mg/dL — AB (ref 8.9–10.3)
CO2: 24 mmol/L (ref 22–32)
Chloride: 93 mmol/L — ABNORMAL LOW (ref 101–111)
Creatinine, Ser: 0.68 mg/dL (ref 0.61–1.24)
Glucose, Bld: 95 mg/dL (ref 65–99)
POTASSIUM: 4.5 mmol/L (ref 3.5–5.1)
Sodium: 125 mmol/L — ABNORMAL LOW (ref 135–145)

## 2015-01-30 LAB — CBC
HCT: 32.5 % — ABNORMAL LOW (ref 39.0–52.0)
Hemoglobin: 10.7 g/dL — ABNORMAL LOW (ref 13.0–17.0)
MCH: 28.8 pg (ref 26.0–34.0)
MCHC: 32.9 g/dL (ref 30.0–36.0)
MCV: 87.4 fL (ref 78.0–100.0)
PLATELETS: 114 10*3/uL — AB (ref 150–400)
RBC: 3.72 MIL/uL — AB (ref 4.22–5.81)
RDW: 17.3 % — AB (ref 11.5–15.5)
WBC: 6.4 10*3/uL (ref 4.0–10.5)

## 2015-01-30 MED ORDER — MAGNESIUM OXIDE 400 (241.3 MG) MG PO TABS
400.0000 mg | ORAL_TABLET | Freq: Once | ORAL | Status: AC
  Administered 2015-01-30: 400 mg via ORAL
  Filled 2015-01-30: qty 1

## 2015-01-30 MED ORDER — POLYETHYLENE GLYCOL 3350 17 G PO PACK
17.0000 g | PACK | Freq: Two times a day (BID) | ORAL | Status: DC
  Administered 2015-01-30 – 2015-02-02 (×4): 17 g via ORAL
  Filled 2015-01-30 (×6): qty 1

## 2015-01-30 MED ORDER — OXYCODONE HCL 5 MG PO TABS: 2.5000 mg | ORAL_TABLET | ORAL | Status: DC | PRN

## 2015-01-30 NOTE — Progress Notes (Signed)
Patient has been in NSR with frequent PVC's. Patient was assisted to [facility], CCMD called to tell patient is in a ventricular bigeminy rhythm. Patient has no complaints at this time. Dr. Susie Cassette to made aware. Will continue to monitor.

## 2015-01-30 NOTE — Progress Notes (Addendum)
PROGRESS NOTE    Hunter Choi MAU:[id number] DOB: [birth year] DOA: 01/27/2015 PCP: Tommy Rainwater, MD  HPI/Brief narrative 81 year old male with history of CAD, HTN, HLD, chronic hyponatremia (baseline sodium in the mid 120s), chronic thrombocytopenia, hospitalized August 2016 for acalculous cholecystitis, UTI, elevated troponin secondary to demand ischemia and had percutaneous cholecystostomy placed and discharged home on antibiotics. Since then cardiology cleared patient for surgery and patient arrived on 11/1 for elective cholecystectomy but was found to have sodium of 119. Surgery was canceled and patient was hospitalized for further management.   Assessment/Plan:  Hyponatremia, subacute on chronic - Baseline sodium in the mid 120s. Admitted with sodium of 119. - Treated with gentle IV fluid hydration and sodium has improved to 127. Trended  down to 125 today If stable around 125 -128, then discharge home tomorrow  Acalculous cholecystitis - Patient had percutaneous cholecystostomy placed in August 2016.  - Status post laparoscopic cholecystectomy 11/2. Stable for discharge in a.m.Marland Kitchen  Chronic combined systolic & diastolic CHF - LVEF 40-45 percent by echo recently. - Compensated  Cardiomyopathy/NSVT - 2-D echo 01/07/15: LVEF 40-45 percent, diffuse hypokinesis and grade 1 diastolic dysfunction. Bioprosthetic aortic valve. Mild MR. EF was unchanged compared to echo 01/13/14 - Replace potassium >4 and magnesium >2 - Add lisinopril 2.5 MG daily. Increase metoprolol to 25 mg PO BID - Monitor closely and if has recurrence, consider cardiology consultation.  Hyperlipidemia - Zocor  Essential hypertension - Controlled  History of aortic stenosis, status post TAVR 2015  PAD / PVD.  AAA / bilateral common iliac artery aneurysms. Not candidate for repair but stable in size at time of last follow up at with Vascular June 2016.   Hypothyroidism -continue  Synthroid  Rheumatoid Arthritis  -on Methotrexate. Dose due Friday  GERD,  -continue daily PPI-start in am  CAD / CABG 1999.   Chronic anemia & thrombocytopenia,  - stable.    DVT prophylaxis: Subcutaneous heparin Code Status: Full Family Communication: None at bedside Disposition Plan: DC home tomorrow if patient's sodium continues to improve   Consultants:  General surgery  Procedures:  Has percutaneous cholecystostomy tube from prior to admission  Laparoscopic Cholecystectomy with IOC Procedure Note 11/2  Antibiotics:  None   Subjective: Pt ate almost all of his breakfast. Doing well with minimal pain  Objective: Filed Vitals:   01/30/15 0003 01/30/15 0436 01/30/15 0900 01/30/15 0934  BP: 156/62 148/56 101/45 101/45  Pulse: 65 69 68   Temp: 98.8 F (37.1 C) 99.3 F (37.4 C) 99.4 F (37.4 C)   TempSrc: Oral Oral Oral   Resp: 18 18 18    Height:      Weight:  62.052 kg (136 lb 12.8 oz)    SpO2: 99% 97% 96%     Intake/Output Summary (Last 24 hours) at 01/30/15 1156 Last data filed at 01/30/15 0906  Gross per 24 hour  Intake 1515.83 ml  Output   1825 ml  Net -309.17 ml   Filed Weights   01/28/15 0337 01/29/15 0526 01/30/15 0436  Weight: 60.483 kg (133 lb 5.4 oz) 61.372 kg (135 lb 4.8 oz) 62.052 kg (136 lb 12.8 oz)     Exam:  General exam: Pleasant elderly male lying comfortably supine in bed. Respiratory system: Clear. No increased work of breathing. Cardiovascular system: S1 & S2 heard, RRR. No JVD, murmurs, gallops, clicks or pedal edema. Telemetry: Sinus rhythm with BBB morphology and occasional PVCs. Subsequently RN reported 6 beats of NSVT-asymptomatic Gastrointestinal system: Abdomen  is nondistended, soft and nontender. Normal bowel sounds heard. Laparoscopic sites without acute findings. Central nervous system: Alert and oriented. No focal neurological deficits. Extremities: Symmetric 5 x 5 power.   Data Reviewed: Basic Metabolic  Panel:  Recent Labs Lab 01/27/15 0703 01/27/15 1532 01/28/15 0332 01/29/15 0433 01/30/15 0303  NA 119* 125* 126* 127* 125*  K 3.9 4.2 3.8 3.8 4.5  CL 84* 90* 92* 94* 93*  CO2 25 27 24 24 24   GLUCOSE 110* 127* 104* 118* 95  BUN 15 13 10 8 9   CREATININE 1.13 0.84 0.81 0.79 0.68  CALCIUM 8.7* 8.6* 8.5* 8.1* 8.0*  MG  --   --   --  1.4*  --    Liver Function Tests:  Recent Labs Lab 01/26/15 1552 01/29/15 0433  AST 28 43*  ALT 13* 20  ALKPHOS 70 61  BILITOT 0.5 0.4  PROT 6.3* 6.1*  ALBUMIN 3.7 3.2*   No results for input(s): LIPASE, AMYLASE in the last 168 hours. No results for input(s): AMMONIA in the last 168 hours. CBC:  Recent Labs Lab 01/26/15 1552 01/29/15 0433 01/30/15 0303  WBC 6.6 7.3 6.4  NEUTROABS 3.2  --   --   HGB 11.2* 10.7* 10.7*  HCT 33.7* 32.5* 32.5*  MCV 85.1 87.8 87.4  PLT 122* 103* 114*   Cardiac Enzymes: No results for input(s): CKTOTAL, CKMB, CKMBINDEX, TROPONINI in the last 168 hours. BNP (last 3 results) No results for input(s): PROBNP in the last 8760 hours. CBG: No results for input(s): GLUCAP in the last 168 hours.  Recent Results (from the past 240 hour(s))  Surgical pcr screen     Status: None   Collection Time: 01/28/15 12:18 PM  Result Value Ref Range Status   MRSA, PCR NEGATIVE NEGATIVE Final   Staphylococcus aureus NEGATIVE NEGATIVE Final    Comment:        The Xpert SA Assay (FDA approved for NASAL specimens in patients over 14 years of age), is one component of a comprehensive surveillance program.  Test performance has been validated by [facility] for patients greater than or equal to 39 year old. It is not intended to diagnose infection nor to guide or monitor treatment.         Studies: Dg Cholangiogram Operative  01/28/2015  CLINICAL DATA:  Old male with a history of cholelithiasis. EXAM: INTRAOPERATIVE CHOLANGIOGRAM TECHNIQUE: Cholangiographic images from the C-arm fluoroscopic device were submitted  for interpretation post-operatively. Please see the procedural report for the amount of contrast and the fluoroscopy time utilized. COMPARISON:  01/01/2015 FINDINGS: Surgical instruments project over the upper abdomen. Distal aspect a pigtail catheter projects in the region of the gallbladder fossa. This has been fractured in the mid drain. There is cannulation of the cystic duct/gallbladder neck, with antegrade infusion of contrast. Caliber of the extrahepatic ductal system within normal limits. No large filling defect identified. Free flow of contrast across the ampulla. IMPRESSION: Intraoperative cholangiogram demonstrates extrahepatic biliary ducts of unremarkable caliber, with no large filling defect identified. Free flow of contrast across the ampulla. Retained foreign body, with the distal aspect at pigtail catheter projecting in the region of the gallbladder fossa. Correlation with gross pathology recommended, to assure that this is removed with the gallbladder. Please refer to the dictated operative report for full details of intraoperative findings and procedure Signed, 13/04/2014. 03/03/2015, DO [facility] Radiology Electronically Signed   By: Loreta Ave D.O.   On: 01/28/2015  15:55        Scheduled Meds: . amLODipine  5 mg Oral Daily  . aspirin EC  81 mg Oral Daily  . heparin subcutaneous  5,000 Units Subcutaneous 3 times per day  . levothyroxine  50 mcg Oral QAC breakfast  . lisinopril  2.5 mg Oral Daily  . methotrexate  25 mg Oral Q Thu  . metoprolol tartrate  25 mg Oral BID  . pantoprazole  40 mg Oral Q1200  . simvastatin  20 mg Oral QPM  . sodium chloride  3 mL Intravenous Q12H   Continuous Infusions: . sodium chloride 50 mL/hr at 01/30/15 0118    Principal Problem:   Hyponatremia Active Problems:   Rheumatoid arthritis (HCC)   GERD (gastroesophageal reflux disease)   Thyroid disease   Coronary artery disease   S/P TAVR  (transcatheter aortic valve replacement)   Thrombocytopenia (HCC)   Cholecystitis   Chronic cholecystitis   Chronic combined systolic and diastolic congestive heart failure (HCC)   Pressure ulcer    Time spent: 20 minutes.    Richarda Overlie, MD,   [facility] Pager [phone]  If 7PM-7AM, please contact night-coverage [email or web address] Password [email or web address] 01/30/2015, 11:56 AM

## 2015-01-30 NOTE — Progress Notes (Signed)
Patient ID: Hunter Choi, male   DOB: 11-10-1927, 81 y.o.   MRN: [id number] 2 Days Post-Op  Subjective: Pt ate almost all of his breakfast.  Doing well with minimal pain  Objective: Vital signs in last 24 hours: Temp:  [98.4 F (36.9 C)-99.3 F (37.4 C)] 99.3 F (37.4 C) (11/04 0436) Pulse Rate:  [57-69] 69 (11/04 0436) Resp:  [18] 18 (11/04 0436) BP: (119-159)/(45-70) 148/56 mmHg (11/04 0436) SpO2:  [94 %-99 %] 97 % (11/04 0436) Weight:  [62.052 kg (136 lb 12.8 oz)] 62.052 kg (136 lb 12.8 oz) (11/04 0436) Last BM Date: 01/29/15  Intake/Output from previous day: 11/03 0701 - 11/04 0700 In: 1810.8 [P.O.:1135; I.V.:675.8] Out: 1475 [Urine:1475] Intake/Output this shift:    PE: Abd: soft, minimally tender, incisions c/d/i, +BS, ND  Lab Results:   Recent Labs  01/29/15 0433 01/30/15 0303  WBC 7.3 6.4  HGB 10.7* 10.7*  HCT 32.5* 32.5*  PLT 103* 114*   BMET  Recent Labs  01/29/15 0433 01/30/15 0303  NA 127* 125*  K 3.8 4.5  CL 94* 93*  CO2 24 24  GLUCOSE 118* 95  BUN 8 9  CREATININE 0.79 0.68  CALCIUM 8.1* 8.0*   PT/INR No results for input(s): LABPROT, INR in the last 72 hours. CMP     Component Value Date/Time   NA 125* 01/30/2015 0303   K 4.5 01/30/2015 0303   CL 93* 01/30/2015 0303   CO2 24 01/30/2015 0303   GLUCOSE 95 01/30/2015 0303   BUN 9 01/30/2015 0303   CREATININE 0.68 01/30/2015 0303   CALCIUM 8.0* 01/30/2015 0303   PROT 6.1* 01/29/2015 0433   ALBUMIN 3.2* 01/29/2015 0433   AST 43* 01/29/2015 0433   ALT 20 01/29/2015 0433   ALKPHOS 61 01/29/2015 0433   BILITOT 0.4 01/29/2015 0433   GFRNONAA >60 01/30/2015 0303   GFRAA >60 01/30/2015 0303   Lipase     Component Value Date/Time   LIPASE 43 11/14/2014 1730       Studies/Results: Dg Cholangiogram Operative  01/28/2015  CLINICAL DATA:  Old male with a history of cholelithiasis. EXAM: INTRAOPERATIVE CHOLANGIOGRAM TECHNIQUE: Cholangiographic images from the C-arm fluoroscopic  device were submitted for interpretation post-operatively. Please see the procedural report for the amount of contrast and the fluoroscopy time utilized. COMPARISON:  01/01/2015 FINDINGS: Surgical instruments project over the upper abdomen. Distal aspect a pigtail catheter projects in the region of the gallbladder fossa. This has been fractured in the mid drain. There is cannulation of the cystic duct/gallbladder neck, with antegrade infusion of contrast. Caliber of the extrahepatic ductal system within normal limits. No large filling defect identified. Free flow of contrast across the ampulla. IMPRESSION: Intraoperative cholangiogram demonstrates extrahepatic biliary ducts of unremarkable caliber, with no large filling defect identified. Free flow of contrast across the ampulla. Retained foreign body, with the distal aspect at pigtail catheter projecting in the region of the gallbladder fossa. Correlation with gross pathology recommended, to assure that this is removed with the gallbladder. Please refer to the dictated operative report for full details of intraoperative findings and procedure Signed, Yvone Neu. Loreta Ave, DO [facility] Radiology Electronically Signed   By: Gilmer Mor D.O.   On: 01/28/2015 15:55    Anti-infectives: Anti-infectives    Start     Dose/Rate Route Frequency Ordered Stop   01/28/15 1240  [MAR Hold]  cefTRIAXone (ROCEPHIN) 2 g in dextrose 5 % 50 mL IVPB     (  MAR Hold since 01/28/15 1212)   2 g 100 mL/hr over 30 Minutes Intravenous To ShortStay Surgical 01/28/15 0753 01/28/15 1440   01/27/15 0700  cefoTEtan in Dextrose 5% (CEFOTAN) IVPB 2 g  Status:  Discontinued     2 g Intravenous To ShortStay Surgical 01/26/15 1352 01/27/15 1152       Assessment/Plan  1. POD 2, s/p lap chole -patient is surgically stable for dc home -his follow up has been arranged from our standpoint and he will see [place] in 2-3 weeks. Rx has been printed  already as well      Jashaun Penrose E 01/30/2015, 8:44 AM Pager: [phone]

## 2015-01-31 DIAGNOSIS — Z8673 Personal history of transient ischemic attack (TIA), and cerebral infarction without residual deficits: Secondary | ICD-10-CM | POA: Diagnosis not present

## 2015-01-31 DIAGNOSIS — K811 Chronic cholecystitis: Secondary | ICD-10-CM | POA: Diagnosis not present

## 2015-01-31 DIAGNOSIS — K219 Gastro-esophageal reflux disease without esophagitis: Secondary | ICD-10-CM | POA: Diagnosis present

## 2015-01-31 DIAGNOSIS — N400 Enlarged prostate without lower urinary tract symptoms: Secondary | ICD-10-CM | POA: Diagnosis present

## 2015-01-31 DIAGNOSIS — I5042 Chronic combined systolic (congestive) and diastolic (congestive) heart failure: Secondary | ICD-10-CM | POA: Diagnosis present

## 2015-01-31 DIAGNOSIS — I2510 Atherosclerotic heart disease of native coronary artery without angina pectoris: Secondary | ICD-10-CM | POA: Diagnosis present

## 2015-01-31 DIAGNOSIS — I25810 Atherosclerosis of coronary artery bypass graft(s) without angina pectoris: Secondary | ICD-10-CM | POA: Diagnosis present

## 2015-01-31 DIAGNOSIS — E222 Syndrome of inappropriate secretion of antidiuretic hormone: Secondary | ICD-10-CM | POA: Diagnosis present

## 2015-01-31 DIAGNOSIS — E039 Hypothyroidism, unspecified: Secondary | ICD-10-CM | POA: Diagnosis present

## 2015-01-31 DIAGNOSIS — E871 Hypo-osmolality and hyponatremia: Secondary | ICD-10-CM | POA: Diagnosis not present

## 2015-01-31 DIAGNOSIS — E785 Hyperlipidemia, unspecified: Secondary | ICD-10-CM | POA: Diagnosis present

## 2015-01-31 DIAGNOSIS — I429 Cardiomyopathy, unspecified: Secondary | ICD-10-CM | POA: Diagnosis present

## 2015-01-31 DIAGNOSIS — K8010 Calculus of gallbladder with chronic cholecystitis without obstruction: Secondary | ICD-10-CM | POA: Diagnosis present

## 2015-01-31 DIAGNOSIS — D696 Thrombocytopenia, unspecified: Secondary | ICD-10-CM | POA: Diagnosis present

## 2015-01-31 DIAGNOSIS — I723 Aneurysm of iliac artery: Secondary | ICD-10-CM | POA: Diagnosis present

## 2015-01-31 DIAGNOSIS — M069 Rheumatoid arthritis, unspecified: Secondary | ICD-10-CM | POA: Diagnosis present

## 2015-01-31 DIAGNOSIS — Z7982 Long term (current) use of aspirin: Secondary | ICD-10-CM | POA: Diagnosis not present

## 2015-01-31 DIAGNOSIS — I110 Hypertensive heart disease with heart failure: Secondary | ICD-10-CM | POA: Diagnosis present

## 2015-01-31 DIAGNOSIS — Z954 Presence of other heart-valve replacement: Secondary | ICD-10-CM | POA: Diagnosis not present

## 2015-01-31 DIAGNOSIS — I714 Abdominal aortic aneurysm, without rupture: Secondary | ICD-10-CM | POA: Diagnosis present

## 2015-01-31 DIAGNOSIS — Z96653 Presence of artificial knee joint, bilateral: Secondary | ICD-10-CM | POA: Diagnosis present

## 2015-01-31 DIAGNOSIS — Z9049 Acquired absence of other specified parts of digestive tract: Secondary | ICD-10-CM | POA: Diagnosis not present

## 2015-01-31 DIAGNOSIS — I739 Peripheral vascular disease, unspecified: Secondary | ICD-10-CM | POA: Diagnosis present

## 2015-01-31 DIAGNOSIS — Z952 Presence of prosthetic heart valve: Secondary | ICD-10-CM | POA: Diagnosis not present

## 2015-01-31 DIAGNOSIS — D509 Iron deficiency anemia, unspecified: Secondary | ICD-10-CM | POA: Diagnosis present

## 2015-01-31 LAB — CBC
HCT: 32.9 % — ABNORMAL LOW (ref 39.0–52.0)
Hemoglobin: 10.7 g/dL — ABNORMAL LOW (ref 13.0–17.0)
MCH: 27.9 pg (ref 26.0–34.0)
MCHC: 32.5 g/dL (ref 30.0–36.0)
MCV: 85.7 fL (ref 78.0–100.0)
PLATELETS: 136 10*3/uL — AB (ref 150–400)
RBC: 3.84 MIL/uL — ABNORMAL LOW (ref 4.22–5.81)
RDW: 17.3 % — AB (ref 11.5–15.5)
WBC: 5.6 10*3/uL (ref 4.0–10.5)

## 2015-01-31 LAB — COMPREHENSIVE METABOLIC PANEL
ALBUMIN: 2.8 g/dL — AB (ref 3.5–5.0)
ALK PHOS: 63 U/L (ref 38–126)
ALT: 21 U/L (ref 17–63)
AST: 41 U/L (ref 15–41)
Anion gap: 7 (ref 5–15)
BILIRUBIN TOTAL: 0.7 mg/dL (ref 0.3–1.2)
BUN: 8 mg/dL (ref 6–20)
CALCIUM: 8.2 mg/dL — AB (ref 8.9–10.3)
CO2: 25 mmol/L (ref 22–32)
CREATININE: 0.7 mg/dL (ref 0.61–1.24)
Chloride: 90 mmol/L — ABNORMAL LOW (ref 101–111)
GFR calc Af Amer: >60 mL/min (ref >60)
GFR calc non Af Amer: >60 mL/min (ref >60)
GLUCOSE: 94 mg/dL (ref 65–99)
Potassium: 4 mmol/L (ref 3.5–5.1)
SODIUM: 122 mmol/L — AB (ref 135–145)
TOTAL PROTEIN: 5.7 g/dL — AB (ref 6.5–8.1)

## 2015-01-31 LAB — OSMOLALITY, URINE: Osmolality, Ur: 414 mOsm/kg (ref 390–1090)

## 2015-01-31 LAB — MAGNESIUM: MAGNESIUM: 1.4 mg/dL — AB (ref 1.7–2.4)

## 2015-01-31 LAB — OSMOLALITY: Osmolality: 252 mOsm/kg — ABNORMAL LOW (ref 275–300)

## 2015-01-31 MED ORDER — ACETAMINOPHEN 325 MG PO TABS: 650.0000 mg | ORAL_TABLET | Freq: Four times a day (QID) | ORAL | Status: DC | PRN

## 2015-01-31 MED ORDER — MAGNESIUM SULFATE 4 GM/100ML IV SOLN
4.0000 g | Freq: Once | INTRAVENOUS | Status: AC
  Administered 2015-01-31: 4 g via INTRAVENOUS
  Filled 2015-01-31: qty 100

## 2015-01-31 NOTE — Progress Notes (Signed)
PROGRESS NOTE    Hunter Choi KGU:[id number] DOB: 01-Nov-1927 DOA: 01/27/2015 PCP: Tommy Rainwater, MD  HPI/Brief narrative 81 year old male with history of CAD, HTN, HLD, chronic hyponatremia (baseline sodium in the mid 120s), chronic thrombocytopenia, hospitalized August 2016 for acalculous cholecystitis, UTI, elevated troponin secondary to demand ischemia and had percutaneous cholecystostomy placed and discharged home on antibiotics. Since then cardiology cleared patient for surgery and patient arrived on 11/1 for elective cholecystectomy but was found to have sodium of 119. Surgery was canceled and patient was hospitalized for further management.   Assessment/Plan:  Hyponatremia, subacute on chronic - Baseline sodium since 2012 has ranged between 125-135. Admitted with sodium of 119. - Etiology of his chronic hyponatremia is unclear. No prior urine or serum osmolarity results in chart. Patient is aware of his chronic hypo-natremia. Does not give any history of fluid restriction advice. - This admission, was empirically treated with gentle IV normal saline hydration and sodium had improved from 119 on 11/1 to 127 on 11/3 but since then again has decreased down to 122. - We will check serum and urine osmolarity. For now continue IV and if turns out to be SIADH, will place on fluid restriction.  Acalculous cholecystitis - Patient had percutaneous cholecystostomy placed in August 2016.  - Status post laparoscopic cholecystectomy 11/2. Stable  Chronic combined systolic & diastolic CHF - LVEF 40-45 percent by echo recently. - Compensated. Does not look volume overloaded.  Cardiomyopathy/NSVT - 2-D echo 01/07/15: LVEF 40-45 percent, diffuse hypokinesis and grade 1 diastolic dysfunction. Bioprosthetic aortic valve. Mild MR. EF was unchanged compared to echo 01/13/14 - Replace potassium >4 and magnesium >2 - Added lisinopril 2.5 MG daily. Increase metoprolol to 25 mg PO BID -  Reviewed the telemetry monitor from 11/3: Not sure if it was truly NSVT or just PVCs. Replace magnesium.   Hyperlipidemia - Zocor  Essential hypertension - Controlled  History of aortic stenosis, status post TAVR 2015  PAD / PVD.  AAA / bilateral common iliac artery aneurysms. Not candidate for repair but stable in size at time of last follow up at with Vascular June 2016.   Hypothyroidism -continue Synthroid  Rheumatoid Arthritis  -on Methotrexate. Dose due Friday  GERD,  -continue daily PPI-start in am  CAD / CABG 1999.   Chronic anemia & thrombocytopenia,  - stable.    DVT prophylaxis: Subcutaneous heparin Code Status: Full Family Communication: None at bedside Disposition Plan: DC home when Na stable.   Consultants:  General surgery  Procedures:  Has percutaneous cholecystostomy tube from prior to admission  Laparoscopic Cholecystectomy with IOC Procedure Note 11/2  Antibiotics:  None   Subjective: Patient tolerating diet and denies complaints. As per nursing, no acute events reported.  Objective: Filed Vitals:   01/30/15 1200 01/30/15 2021 01/31/15 0456 01/31/15 0457  BP: 125/48 135/50  141/49  Pulse: 59 63  67  Temp: 98.5 F (36.9 C) 99.3 F (37.4 C)  98.4 F (36.9 C)  TempSrc: Oral Oral  Oral  Resp: 16 17    Height:      Weight:   62.143 kg (137 lb)   SpO2: 96% 100%  98%    Intake/Output Summary (Last 24 hours) at 01/31/15 1214 Last data filed at 01/31/15 0951  Gross per 24 hour  Intake   3165 ml  Output   1750 ml  Net   1415 ml   Filed Weights   01/29/15 0526 01/30/15 0436 01/31/15 0456  Weight: 61.372 kg (  135 lb 4.8 oz) 62.052 kg (136 lb 12.8 oz) 62.143 kg (137 lb)     Exam:  General exam: Pleasant elderly male lying comfortably supine in bed. Respiratory system: Clear. No increased work of breathing. Cardiovascular system: S1 & S2 heard, RRR. No JVD, murmurs, gallops, clicks or pedal edema. Telemetry: Sinus rhythm with  BBB morphology, frequent PVC's, occasional bigemini/trigemini and a sinus pause 2.13 sec on 11/3 at 11:28 PM. Gastrointestinal system: Abdomen is nondistended, soft and nontender. Normal bowel sounds heard. Laparoscopic sites without acute findings. Central nervous system: Alert and oriented. No focal neurological deficits. Extremities: Symmetric 5 x 5 power.   Data Reviewed: Basic Metabolic Panel:  Recent Labs Lab 01/27/15 1532 01/28/15 0332 01/29/15 0433 01/30/15 0303 01/31/15 0428 01/31/15 0815  NA 125* 126* 127* 125* 122*  --   K 4.2 3.8 3.8 4.5 4.0  --   CL 90* 92* 94* 93* 90*  --   CO2 27 24 24 24 25   --   GLUCOSE 127* 104* 118* 95 94  --   BUN 13 10 8 9 8   --   CREATININE 0.84 0.81 0.79 0.68 0.70  --   CALCIUM 8.6* 8.5* 8.1* 8.0* 8.2*  --   MG  --   --  1.4*  --   --  1.4*   Liver Function Tests:  Recent Labs Lab 01/26/15 1552 01/29/15 0433 01/31/15 0428  AST 28 43* 41  ALT 13* 20 21  ALKPHOS 70 61 63  BILITOT 0.5 0.4 0.7  PROT 6.3* 6.1* 5.7*  ALBUMIN 3.7 3.2* 2.8*   No results for input(s): LIPASE, AMYLASE in the last 168 hours. No results for input(s): AMMONIA in the last 168 hours. CBC:  Recent Labs Lab 01/26/15 1552 01/29/15 0433 01/30/15 0303 01/31/15 0428  WBC 6.6 7.3 6.4 5.6  NEUTROABS 3.2  --   --   --   HGB 11.2* 10.7* 10.7* 10.7*  HCT 33.7* 32.5* 32.5* 32.9*  MCV 85.1 87.8 87.4 85.7  PLT 122* 103* 114* 136*   Cardiac Enzymes: No results for input(s): CKTOTAL, CKMB, CKMBINDEX, TROPONINI in the last 168 hours. BNP (last 3 results) No results for input(s): PROBNP in the last 8760 hours. CBG: No results for input(s): GLUCAP in the last 168 hours.  Recent Results (from the past 240 hour(s))  Surgical pcr screen     Status: None   Collection Time: 01/28/15 12:18 PM  Result Value Ref Range Status   MRSA, PCR NEGATIVE NEGATIVE Final   Staphylococcus aureus NEGATIVE NEGATIVE Final    Comment:        The Xpert SA Assay (FDA approved for  NASAL specimens in patients over 22 years of age), is one component of a comprehensive surveillance program.  Test performance has been validated by [facility] for patients greater than or equal to 90 year old. It is not intended to diagnose infection nor to guide or monitor treatment.         Studies: No results found.      Scheduled Meds: . amLODipine  5 mg Oral Daily  . aspirin EC  81 mg Oral Daily  . heparin subcutaneous  5,000 Units Subcutaneous 3 times per day  . levothyroxine  50 mcg Oral QAC breakfast  . lisinopril  2.5 mg Oral Daily  . magnesium sulfate 1 - 4 g bolus IVPB  4 g Intravenous Once  . methotrexate  25 mg Oral Q Thu  . metoprolol tartrate  25 mg  Oral BID  . pantoprazole  40 mg Oral Q1200  . polyethylene glycol  17 g Oral BID  . simvastatin  20 mg Oral QPM  . sodium chloride  3 mL Intravenous Q12H   Continuous Infusions: . sodium chloride 75 mL/hr at 01/30/15 1822    Principal Problem:   Hyponatremia Active Problems:   Rheumatoid arthritis (HCC)   GERD (gastroesophageal reflux disease)   Thyroid disease   Coronary artery disease   S/P TAVR (transcatheter aortic valve replacement)   Thrombocytopenia (HCC)   Cholecystitis   Chronic cholecystitis   Chronic combined systolic and diastolic congestive heart failure (HCC)   Pressure ulcer    Time spent: 20 minutes.    Marcellus Scott, MD, FACP, FHM. [facility] Pager [phone]  If 7PM-7AM, please contact night-coverage [email or web address] Password [email or web address] 01/31/2015, 12:14 PM

## 2015-02-01 DIAGNOSIS — E222 Syndrome of inappropriate secretion of antidiuretic hormone: Principal | ICD-10-CM

## 2015-02-01 LAB — SODIUM, URINE, RANDOM: SODIUM UR: 92 mmol/L

## 2015-02-01 LAB — BASIC METABOLIC PANEL
ANION GAP: 6 (ref 5–15)
ANION GAP: 11 (ref 5–15)
BUN: 9 mg/dL (ref 6–20)
BUN: 11 mg/dL (ref 6–20)
CHLORIDE: 88 mmol/L — AB (ref 101–111)
CHLORIDE: 84 mmol/L — AB (ref 101–111)
CO2: 27 mmol/L (ref 22–32)
CO2: 25 mmol/L (ref 22–32)
Calcium: 8.2 mg/dL — ABNORMAL LOW (ref 8.9–10.3)
Calcium: 8.7 mg/dL — ABNORMAL LOW (ref 8.9–10.3)
Creatinine, Ser: 0.68 mg/dL (ref 0.61–1.24)
Creatinine, Ser: 0.84 mg/dL (ref 0.61–1.24)
GFR calc non Af Amer: >60 mL/min (ref >60)
Glucose, Bld: 99 mg/dL (ref 65–99)
Glucose, Bld: 106 mg/dL — ABNORMAL HIGH (ref 65–99)
POTASSIUM: 4.3 mmol/L (ref 3.5–5.1)
POTASSIUM: 4.2 mmol/L (ref 3.5–5.1)
SODIUM: 121 mmol/L — AB (ref 135–145)
SODIUM: 120 mmol/L — AB (ref 135–145)

## 2015-02-01 LAB — MAGNESIUM: MAGNESIUM: 1.9 mg/dL (ref 1.7–2.4)

## 2015-02-01 MED ORDER — FUROSEMIDE 10 MG/ML IJ SOLN
20.0000 mg | Freq: Once | INTRAMUSCULAR | Status: AC
  Administered 2015-02-01: 20 mg via INTRAVENOUS
  Filled 2015-02-01: qty 2

## 2015-02-01 MED ORDER — FUROSEMIDE 10 MG/ML IJ SOLN
20.0000 mg | Freq: Once | INTRAMUSCULAR | Status: AC
  Administered 2015-02-01: 20 mg via INTRAVENOUS

## 2015-02-01 MED ORDER — FUROSEMIDE 10 MG/ML IJ SOLN
INTRAMUSCULAR | Status: AC
  Filled 2015-02-01: qty 2

## 2015-02-01 NOTE — Progress Notes (Signed)
PROGRESS NOTE    QUENCY TOBER BOF:[id number] DOB: [birth year] DOA: 01/27/2015 PCP: Tommy Rainwater, MD  HPI/Brief narrative 81 year old male with history of CAD, HTN, HLD, chronic hyponatremia (baseline sodium in the mid 120s), chronic thrombocytopenia, hospitalized August 2016 for acalculous cholecystitis, UTI, elevated troponin secondary to demand ischemia and had percutaneous cholecystostomy placed and discharged home on antibiotics. Since then cardiology cleared patient for surgery and patient arrived on 11/1 for elective cholecystectomy but was found to have sodium of 119. Status post laparoscopic cholecystectomy-has done well. Discharge delayed secondary to worsening hyponatremia.   Assessment/Plan:  Hyponatremia, subacute on chronic-likely SIADH. - Baseline sodium since 2012 has ranged between 125-135. Admitted with sodium of 119. - Etiology of his chronic hyponatremia is unclear. No prior urine or serum osmolarity results in chart. Patient is aware of his chronic hypo-natremia. Does not give any history of fluid restriction advice. - This admission, was empirically treated with gentle IV normal saline hydration and sodium had improved from 119 on 11/1 to 127 on 11/3 but since then again has decreased down to 122. - Serum osmolarity: 252 and urine osmolarity 414. - Initiate fluid restriction 1200 mL per day and will give a dose of IV Lasix 20 mg 1. Strict intake and output. Follow BMP every 12 hours.   Acalculous cholecystitis - Patient had percutaneous cholecystostomy placed in August 2016.  - Status post laparoscopic cholecystectomy 11/2. Stable  Chronic combined systolic & diastolic CHF - LVEF 40-45 percent by echo recently. - Compensated. Does not look volume overloaded.  Cardiomyopathy/NSVT - 2-D echo 01/07/15: LVEF 40-45 percent, diffuse hypokinesis and grade 1 diastolic dysfunction. Bioprosthetic aortic valve. Mild MR. EF was unchanged compared to echo  01/13/14 - Replace potassium >4 and magnesium >2 - Added lisinopril 2.5 MG daily. Increase metoprolol to 25 mg PO BID - Reviewed the telemetry monitor from 11/3: Not sure if it was truly NSVT or just PVCs. Replaced magnesium.  - Stable without any further episodes of NSVT  Hyperlipidemia - Zocor  Essential hypertension - Controlled  History of aortic stenosis, status post TAVR 2015  PAD / PVD.  AAA / bilateral common iliac artery aneurysms. Not candidate for repair but stable in size at time of last follow up at with [facility] June 2016.   Hypothyroidism -continue Synthroid  Rheumatoid Arthritis  -on Methotrexate. Dose due Friday  GERD,  -continue daily PPI-start in am  CAD / CABG 1999.   Chronic anemia & thrombocytopenia,  - stable.    DVT prophylaxis: Subcutaneous heparin Code Status: Full Family Communication: None at bedside Disposition Plan: DC home when Na stable.   Consultants:  General surgery  Procedures:  Has percutaneous cholecystostomy tube from prior to admission  Laparoscopic Cholecystectomy with IOC Procedure Note 11/2  Antibiotics:  None   Subjective: Denies complaints.  Objective: Filed Vitals:   01/31/15 1344 01/31/15 1948 01/31/15 1950 02/01/15 0500  BP: 125/46 130/53  149/74  Pulse: 55 59 62 73  Temp: 98.1 F (36.7 C) 98.5 F (36.9 C)  98.5 F (36.9 C)  TempSrc: Oral Oral  Oral  Resp: 16 16  16   Height:      Weight:    61.281 kg (135 lb 1.6 oz)  SpO2: 100% 97%  99%    Intake/Output Summary (Last 24 hours) at 02/01/15 1057 Last data filed at 02/01/15 1002  Gross per 24 hour  Intake    980 ml  Output   1451 ml  Net   -471 ml  Filed Weights   01/30/15 0436 01/31/15 0456 02/01/15 0500  Weight: 62.052 kg (136 lb 12.8 oz) 62.143 kg (137 lb) 61.281 kg (135 lb 1.6 oz)     Exam:  General exam: Pleasant elderly male sitting up in bed eating breakfast this morning.  Respiratory system: Clear. No increased work of  breathing. Cardiovascular system: S1 & S2 heard, RRR. No JVD, murmurs, gallops, clicks or pedal edema. Telemetry: Sinus rhythm with BBB morphology, frequent PVC's. No NSVT or pauses. Gastrointestinal system: Abdomen is nondistended, soft and nontender. Normal bowel sounds heard. Laparoscopic sites without acute findings. Central nervous system: Alert and oriented. No focal neurological deficits. Extremities: Symmetric 5 x 5 power.   Data Reviewed: Basic Metabolic Panel:  Recent Labs Lab 01/28/15 0332 01/29/15 0433 01/30/15 0303 01/31/15 0428 01/31/15 0815 02/01/15 0154  NA 126* 127* 125* 122*  --  121*  K 3.8 3.8 4.5 4.0  --  4.3  CL 92* 94* 93* 90*  --  88*  CO2 24 24 24 25   --  27  GLUCOSE 104* 118* 95 94  --  99  BUN 10 8 9 8   --  9  CREATININE 0.81 0.79 0.68 0.70  --  0.68  CALCIUM 8.5* 8.1* 8.0* 8.2*  --  8.2*  MG  --  1.4*  --   --  1.4* 1.9   Liver Function Tests:  Recent Labs Lab 01/26/15 1552 01/29/15 0433 01/31/15 0428  AST 28 43* 41  ALT 13* 20 21  ALKPHOS 70 61 63  BILITOT 0.5 0.4 0.7  PROT 6.3* 6.1* 5.7*  ALBUMIN 3.7 3.2* 2.8*   No results for input(s): LIPASE, AMYLASE in the last 168 hours. No results for input(s): AMMONIA in the last 168 hours. CBC:  Recent Labs Lab 01/26/15 1552 01/29/15 0433 01/30/15 0303 01/31/15 0428  WBC 6.6 7.3 6.4 5.6  NEUTROABS 3.2  --   --   --   HGB 11.2* 10.7* 10.7* 10.7*  HCT 33.7* 32.5* 32.5* 32.9*  MCV 85.1 87.8 87.4 85.7  PLT 122* 103* 114* 136*   Cardiac Enzymes: No results for input(s): CKTOTAL, CKMB, CKMBINDEX, TROPONINI in the last 168 hours. BNP (last 3 results) No results for input(s): PROBNP in the last 8760 hours. CBG: No results for input(s): GLUCAP in the last 168 hours.  Recent Results (from the past 240 hour(s))  Surgical pcr screen     Status: None   Collection Time: 01/28/15 12:18 PM  Result Value Ref Range Status   MRSA, PCR NEGATIVE NEGATIVE Final   Staphylococcus aureus NEGATIVE  NEGATIVE Final    Comment:        The Xpert SA Assay (FDA approved for NASAL specimens in patients over 73 years of age), is one component of a comprehensive surveillance program.  Test performance has been validated by [facility] for patients greater than or equal to 65 year old. It is not intended to diagnose infection nor to guide or monitor treatment.         Studies: No results found.      Scheduled Meds: . amLODipine  5 mg Oral Daily  . aspirin EC  81 mg Oral Daily  . heparin subcutaneous  5,000 Units Subcutaneous 3 times per day  . levothyroxine  50 mcg Oral QAC breakfast  . lisinopril  2.5 mg Oral Daily  . methotrexate  25 mg Oral Q Thu  . metoprolol tartrate  25 mg Oral BID  . pantoprazole  40 mg  Oral Q1200  . polyethylene glycol  17 g Oral BID  . simvastatin  20 mg Oral QPM  . sodium chloride  3 mL Intravenous Q12H   Continuous Infusions:    Principal Problem:   Hyponatremia Active Problems:   Rheumatoid arthritis (HCC)   GERD (gastroesophageal reflux disease)   Thyroid disease   Coronary artery disease   S/P TAVR (transcatheter aortic valve replacement)   Thrombocytopenia (HCC)   Cholecystitis   Chronic cholecystitis   Chronic combined systolic and diastolic congestive heart failure (HCC)   Pressure ulcer    Time spent: 20 minutes.    Marcellus Scott, MD, FACP, FHM. [facility] Pager [phone]  If 7PM-7AM, please contact night-coverage [email or web address] Password [email or web address] 02/01/2015, 10:57 AM    LOS: 1 day

## 2015-02-02 LAB — CBC
HCT: 32 % — ABNORMAL LOW (ref 39.0–52.0)
HEMOGLOBIN: 10.6 g/dL — AB (ref 13.0–17.0)
MCH: 28 pg (ref 26.0–34.0)
MCHC: 33.1 g/dL (ref 30.0–36.0)
MCV: 84.4 fL (ref 78.0–100.0)
PLATELETS: 164 10*3/uL (ref 150–400)
RBC: 3.79 MIL/uL — AB (ref 4.22–5.81)
RDW: 16.6 % — ABNORMAL HIGH (ref 11.5–15.5)
WBC: 5 10*3/uL (ref 4.0–10.5)

## 2015-02-02 LAB — BASIC METABOLIC PANEL
ANION GAP: 9 (ref 5–15)
BUN: 12 mg/dL (ref 6–20)
CALCIUM: 8.5 mg/dL — AB (ref 8.9–10.3)
CO2: 27 mmol/L (ref 22–32)
CREATININE: 0.78 mg/dL (ref 0.61–1.24)
Chloride: 84 mmol/L — ABNORMAL LOW (ref 101–111)
Glucose, Bld: 103 mg/dL — ABNORMAL HIGH (ref 65–99)
Potassium: 4.2 mmol/L (ref 3.5–5.1)
Sodium: 120 mmol/L — ABNORMAL LOW (ref 135–145)

## 2015-02-02 LAB — TSH: TSH: 4.353 u[IU]/mL (ref 0.350–4.500)

## 2015-02-02 MED ORDER — FUROSEMIDE 20 MG PO TABS
20.0000 mg | ORAL_TABLET | Freq: Every day | ORAL | Status: DC
  Administered 2015-02-02 – 2015-02-03 (×2): 20 mg via ORAL
  Filled 2015-02-02 (×2): qty 1

## 2015-02-02 MED ORDER — AMLODIPINE BESYLATE 2.5 MG PO TABS
2.5000 mg | ORAL_TABLET | Freq: Every day | ORAL | Status: DC
  Administered 2015-02-03: 2.5 mg via ORAL
  Filled 2015-02-02: qty 1

## 2015-02-02 NOTE — Progress Notes (Signed)
Physical Therapy Treatment Patient Details Name: Hunter Choi MRN: [id number] DOB: 11/14/1927 Today's Date: 02/02/2015    History of Present Illness pt is an 81 y/o male with h/o CAD, HTN, AAA, Stroke, afib, recently discharged with perc. drain for acalculous cholecystitis.  Now admitted for cholecystectomy, but deferred due to hyponatremia.  11/02, pt s/p lap chole.    PT Comments    Pt initially resistant to participating in PT, however agreed to walk short distance after discussing risks of immobility. Pt walked 50' with RW with min/guard assist. He has 24* assist at home.   Follow Up Recommendations  No PT follow up     Equipment Recommendations  None recommended by PT    Recommendations for Other Services       Precautions / Restrictions Precautions Precautions: Fall Precaution Comments: poor vision 2* macular degeneration Restrictions Weight Bearing Restrictions: No    Mobility  Bed Mobility Overal bed mobility: Modified Independent Bed Mobility: Supine to Sit     Supine to sit: Modified independent (Device/Increase time)     General bed mobility comments: increased time, HOB up 45*, used rail, no physical assist  Transfers Overall transfer level: Needs assistance Equipment used: Rolling walker (2 wheeled)   Sit to Stand: Min guard         General transfer comment: cues for hand placement  Ambulation/Gait Ambulation/Gait assistance: Min guard Ambulation Distance (Feet): 28 Feet Assistive device: Rolling walker (2 wheeled) Gait Pattern/deviations: Step-through pattern;Trunk flexed;Decreased stride length;Decreased step length - right;Decreased step length - left Gait velocity: WFL   General Gait Details: no LOB, min/guard for safety, verbal cues to keep RW closer, distance limited by fatigue   Stairs            Wheelchair Mobility    Modified Rankin (Stroke Patients Only)       Balance     Sitting balance-Leahy Scale: Fair        Standing balance-Leahy Scale: Poor                      Cognition Arousal/Alertness: Awake/alert Behavior During Therapy: WFL for tasks assessed/performed Overall Cognitive Status: Difficult to assess                      Exercises      General Comments        Pertinent Vitals/Pain Pain Assessment: No/denies pain    Home Living                      Prior Function            PT Goals (current goals can now be found in the care plan section) Acute Rehab PT Goals PT Goal Formulation: With patient Time For Goal Achievement: 02/12/15 Potential to Achieve Goals: Good Progress towards PT goals: Progressing toward goals    Frequency  Min 3X/week    PT Plan Current plan remains appropriate    Co-evaluation             End of Session Equipment Utilized During Treatment: Gait belt Activity Tolerance: Patient tolerated treatment well Patient left: with call bell/phone within reach;in bed     Time: 9678-9381 PT Time Calculation (min) (ACUTE ONLY): 10 min  Charges:  $Gait Training: 8-22 mins                    G Codes:      [name]  02/02/2015, 1:42 PM [phone]

## 2015-02-02 NOTE — Consult Note (Signed)
Nephrology Consult Note   Patient name: Hunter Choi Medical record number: [id number] Date of birth: [birth year] Age: 81 y.o. Gender: male  Primary Care Provider: Lonzo Cloud, Landry Mellow, MD  Pt Overview and Major Events to Date:  11/1 - Pt. Arrived for elective cholecystectomy. Found to have hyponatremia to 119 mmol/L on top of known chronic hyponatremia (normally 125 - 130). Admitted for management of hyponatremia before proceeding with surgery.  11/2 - Hyponatremia improved to 150mmol/L with Normal Saline Administration overnight. Cholecystectomy performed.  11/3-11/6: Good postoperative course. Hyponatremia initially normalizing. Intermittent Normal Saline administration with fluids completely stopped 11/5. Subsequent downtrending Na to 120 on 11/6 - 11/7. Fluid restriction 1200cc. Workup for hyponatremia started.   Pt. Is a 81 y/o M with hx of chronic hyponatremia, significant cardiac Hx CAD s/p CABG 1999, Aortic Stenosis s/p TAVR 2015, Combined CHF EF 40-45% 12/2014, AAA / BL Iliac Artery Aneurysm, PAD, Hy[pothyroidism, Rheumatoid Arthritis on Methotrexate, Chronic Iron Deficiency Anemia (hgb 10 - 11), Chronic Thrombocytopenia.   He presented to the hospital initially for elective surgery for acalculous cholecystitis initially managed with a perc drain. Surgery had to be postponed due to worsening of his hyponatremia. He was admitted to the medicine service. He subsequently underwent successful cholecystectomy. He was given normal saline during the initial portion of his hospitalization, but has not received any further fluids since 11/5. He says that he has had hyponatremia for some time " more than 5 years". He says it has been worked up in the past without any specific diagnosis being made. He is not on a fluid restriction at home. He says that he is not thirsty. He normally drinks 5-6 small glasses of water a day at home. He has not had a decrease in urine output. He says he urinates  frequently at night. He has not had any confusion, tremors, or cramps as a result of his low sodium. He has been on methotrexate for some time, and has been on the same dose of Synthroid for some time as well. He has not made any recent medication changes. He does not normally take any diuretics. He has not had any LE swelling or SOB. He has not had any diarrhea, or vomiting. He smoked a long time ago (quit 1960), his son smokes at home, but smokes outside. He does not have any significant second hand exposure otherwise.     Assessment and Plan: 81 y/o M with hx of chronic hyponatremia here with acute on chronic hyponatremia. S/P cholecystectomy POD 5, CHF, s/p TAVR, Hypothyroidism, Rheumatoid Arthritis on Methotrexate for years.   1. Hyponatremia: Euvolemic. Na 120 today. He came in at 93. Trended up to 126 with 0.9%NS administration. Chloride - 84, Urine Osmolality 414 too high for situation, Serum Osmolality 252, Ur Na - 92. Given Lasix 20mg  x 1 on 11/6. Somewhat consistent with SIADH. ?Hypothyroidism. SIADH in the past with reset osmostat given that this is chronic. Not in CHF exacerbation, EF is appropriate. Asymptomatic - Continue Fluid restriction.  - Check TSH - Daily Lasix 20mg   - Trend Daily BMET - If improving some may need to continue workup as an outpatient as he is asymptomatic with chronic hyponatremia.  So initially pre op it probably was a hypovolemic hyponatremic picture and that is why it improved with saline admin- once got euvolemic his likely SIADH started to show and worsened without fluid restrict.  Appropriate measure were taken with fluid restriction and lasix but I just dont think we have  seen a result from that just yet.  We will continue lasix (i decreased norvasc just because I am worried about hypotension), and continue fluid restriction which it seems that he does follow at home just not on purpose.  Hopefully sodium will plateau and then improve- I would be happy if it got  near 125 or at least heading that way for discharge.  For completeness sake- recheck TSH  2. S/p Cholecystectomy - post op day 5. Doing well.   3. Hypothyroidism - last TSH - 1.4. On of Synthroid daily.   4. Rheumatoid - On Methotrexate for years. Continue here. Physical exam consistent with this.  5. CHF Combined - Last EF 40-45%. No exacerbation at this time, and decent cardiac output. Unlikely contributing to Hyponatremia.   6. Chronic Iron Deficiency Anemia / Thrombocytopenia - Baseline. Stable.   7/ AAA / Iliac Aneurysm - nothing to do at this time. Following with Vascular.    Objective: Temp:  [98.2 F (36.8 C)-99.2 F (37.3 C)] 99.2 F (37.3 C) (11/07 0509) Pulse Rate:  [63-83] 63 (11/07 0509) Resp:  [16-20] 17 (11/07 0509) BP: (109-130)/(48-57) 125/57 mmHg (11/07 0509) SpO2:  [97 %-100 %] 97 % (11/07 0509) Weight:  [133 lb 14.4 oz (60.737 kg)] 133 lb 14.4 oz (60.737 kg) (11/07 0509) Physical Exam: General: NAD, resting comfortably in bed.  Cardiovascular: RRR, Systolic Ejection Murmur, 2+ distal pulses.  Respiratory: CTA Bilaterally  Abdomen: S, NT, ND Extremities: WWP, no edema.  Neuro: No focal deficits Psych: Appropriate mood / affect.   Laboratory:  Recent Labs Lab 01/30/15 0303 01/31/15 0428 02/02/15 0440  WBC 6.4 5.6 5.0  HGB 10.7* 10.7* 10.6*  HCT 32.5* 32.9* 32.0*  PLT 114* 136* 164    Recent Labs Lab 01/26/15 1552  01/29/15 0433  01/31/15 0428 02/01/15 0154 02/01/15 1634 02/02/15 0440  NA 122*  < > 127*  < > 122* 121* 120* 120*  K 4.0  < > 3.8  < > 4.0 4.3 4.2 4.2  CL 87*  < > 94*  < > 90* 88* 84* 84*  CO2 24  < > 24  < > 25 27 25 27   BUN 16  < > 8  < > 8 9 11 12   CREATININE 1.31*  < > 0.79  < > 0.70 0.68 0.84 0.78  CALCIUM 8.9  < > 8.1*  < > 8.2* 8.2* 8.7* 8.5*  PROT 6.3*  --  6.1*  --  5.7*  --   --   --   BILITOT 0.5  --  0.4  --  0.7  --   --   --   ALKPHOS 70  --  61  --  63  --   --   --   ALT 13*  --  20  --  21  --   --    --   AST 28  --  43*  --  41  --   --   --   GLUCOSE 115*  < > 118*  < > 94 99 106* 103*  < > = values in this interval not displayed.   , MD 02/02/2015, 10:48 AM PGY-2  Patient seen and examined, agree with above note with above modifications.  See my assessment above  Yolande Jolly, MD 02/02/2015

## 2015-02-02 NOTE — Progress Notes (Signed)
PROGRESS NOTE    Hunter Choi TMH:[id number] DOB: 25-May-1927 DOA: 01/27/2015 PCP: Tommy Rainwater, MD  HPI/Brief narrative 81 year old male with history of CAD, HTN, HLD, chronic hyponatremia (baseline sodium in the mid 120s), chronic thrombocytopenia, hospitalized August 2016 for acalculous cholecystitis, UTI, elevated troponin secondary to demand ischemia and had percutaneous cholecystostomy placed and discharged home on antibiotics. Since then cardiology cleared patient for surgery and patient arrived on 11/1 for elective cholecystectomy but was found to have sodium of 119. Status post laparoscopic cholecystectomy-has done well. Discharge delayed secondary to worsening hyponatremia.   Assessment/Plan:  Hyponatremia, subacute on chronic-likely SIADH. - Baseline sodium since 2012 has ranged between 125-135. Admitted with sodium of 119. - Etiology of his chronic hyponatremia is unclear. No prior urine or serum osmolarity results in chart. Patient is aware of his chronic hypo-natremia. Does not give any history of fluid restriction advice. - This admission, was empirically treated with gentle IV normal saline hydration and sodium had improved from 119 on 11/1 to 127 on 11/3 but since then again has decreased down to 120. - Serum osmolarity: 252 and urine osmolarity 414. - Initiated fluid restriction 1200 mL per day and provided with Lasix 20 mg IV 2 doses on 11/6. Despite these measures and good diuresis, sodium persists at 120 albeit asymptomatic. - Nephrology consultation appreciated: Indicated that admission/preop low sodium was probably due to hypovolemic hyponatremia and improved on normal saline administration. Once he got euvolemic, SIADH resurfaced and worsened without fluid restriction. Continuing Lasix and follow BMP in a.m.  Acalculous cholecystitis - Patient had percutaneous cholecystostomy placed in August 2016.  - Status post laparoscopic cholecystectomy 11/2.  Stable  Chronic combined systolic & diastolic CHF - LVEF 40-45 percent by echo recently. - Compensated. Does not look volume overloaded.  Cardiomyopathy/NSVT - 2-D echo 01/07/15: LVEF 40-45 percent, diffuse hypokinesis and grade 1 diastolic dysfunction. Bioprosthetic aortic valve. Mild MR. EF was unchanged compared to echo 01/13/14 - Replace potassium >4 and magnesium >2 - Added lisinopril 2.5 MG daily. Increase metoprolol to 25 mg PO BID - Reviewed the telemetry monitor from 11/3: Not sure if it was truly NSVT or just PVCs. Replaced magnesium.  - Stable without any further episodes of NSVT  Hyperlipidemia - Zocor  Essential hypertension - Controlled. Amlodipine decreased on 02/02/11 point hypotension.  History of aortic stenosis, status post TAVR 2015  PAD / PVD.  AAA / bilateral common iliac artery aneurysms. Not candidate for repair but stable in size at time of last follow up at with [facility] June 2016.   Hypothyroidism -continue Synthroid. TSH 05/24/14:1.497.  Rheumatoid Arthritis  -on Methotrexate. Dose due Friday  GERD,  -continue daily PPI-start in am  CAD / CABG 1999.   Chronic anemia & thrombocytopenia,  - stable.    DVT prophylaxis: Subcutaneous heparin Code Status: Full Family Communication: None at bedside Disposition Plan: DC home when Na stable.   Consultants:  General surgery  Procedures:  Has percutaneous cholecystostomy tube from prior to admission  Laparoscopic Cholecystectomy with IOC Procedure Note 11/2  Antibiotics:  None   Subjective: Denies complaints.  Objective: Filed Vitals:   02/01/15 2003 02/01/15 2200 02/02/15 0509 02/02/15 1200  BP: 130/52  125/57 129/58  Pulse: 69  63 61  Temp: 98.2 F (36.8 C)  99.2 F (37.3 C) 98.3 F (36.8 C)  TempSrc: Oral  Oral Oral  Resp:  16 17   Height:      Weight:   60.737 kg (133 lb 14.4  oz)   SpO2: 99%  97% 97%    Intake/Output Summary (Last 24 hours) at 02/02/15 1421 Last data  filed at 02/02/15 1000  Gross per 24 hour  Intake    860 ml  Output   1175 ml  Net   -315 ml   Filed Weights   01/31/15 0456 02/01/15 0500 02/02/15 0509  Weight: 62.143 kg (137 lb) 61.281 kg (135 lb 1.6 oz) 60.737 kg (133 lb 14.4 oz)     Exam:  General exam: Pleasant elderly male sitting up in bed eating breakfast this morning.  Respiratory system: Clear. No increased work of breathing. Cardiovascular system: S1 & S2 heard, RRR. No JVD, murmurs, gallops, clicks or pedal edema. Telemetry: Sinus rhythm with BBB morphology, occasional PVC's. No NSVT or pauses. Gastrointestinal system: Abdomen is nondistended, soft and nontender. Normal bowel sounds heard. Laparoscopic sites without acute findings. Central nervous system: Alert and oriented. No focal neurological deficits. Extremities: Symmetric 5 x 5 power.   Data Reviewed: Basic Metabolic Panel:  Recent Labs Lab 01/29/15 0433 01/30/15 0303 01/31/15 0428 01/31/15 0815 02/01/15 0154 02/01/15 1634 02/02/15 0440  NA 127* 125* 122*  --  121* 120* 120*  K 3.8 4.5 4.0  --  4.3 4.2 4.2  CL 94* 93* 90*  --  88* 84* 84*  CO2 24 24 25   --  27 25 27   GLUCOSE 118* 95 94  --  99 106* 103*  BUN 8 9 8   --  9 11 12   CREATININE 0.79 0.68 0.70  --  0.68 0.84 0.78  CALCIUM 8.1* 8.0* 8.2*  --  8.2* 8.7* 8.5*  MG 1.4*  --   --  1.4* 1.9  --   --    Liver Function Tests:  Recent Labs Lab 01/26/15 1552 01/29/15 0433 01/31/15 0428  AST 28 43* 41  ALT 13* 20 21  ALKPHOS 70 61 63  BILITOT 0.5 0.4 0.7  PROT 6.3* 6.1* 5.7*  ALBUMIN 3.7 3.2* 2.8*   No results for input(s): LIPASE, AMYLASE in the last 168 hours. No results for input(s): AMMONIA in the last 168 hours. CBC:  Recent Labs Lab 01/26/15 1552 01/29/15 0433 01/30/15 0303 01/31/15 0428 02/02/15 0440  WBC 6.6 7.3 6.4 5.6 5.0  NEUTROABS 3.2  --   --   --   --   HGB 11.2* 10.7* 10.7* 10.7* 10.6*  HCT 33.7* 32.5* 32.5* 32.9* 32.0*  MCV 85.1 87.8 87.4 85.7 84.4  PLT 122*  103* 114* 136* 164   Cardiac Enzymes: No results for input(s): CKTOTAL, CKMB, CKMBINDEX, TROPONINI in the last 168 hours. BNP (last 3 results) No results for input(s): PROBNP in the last 8760 hours. CBG: No results for input(s): GLUCAP in the last 168 hours.  Recent Results (from the past 240 hour(s))  Surgical pcr screen     Status: None   Collection Time: 01/28/15 12:18 PM  Result Value Ref Range Status   MRSA, PCR NEGATIVE NEGATIVE Final   Staphylococcus aureus NEGATIVE NEGATIVE Final    Comment:        The Xpert SA Assay (FDA approved for NASAL specimens in patients over 39 years of age), is one component of a comprehensive surveillance program.  Test performance has been validated by [facility] for patients greater than or equal to 69 year old. It is not intended to diagnose infection nor to guide or monitor treatment.         Studies: No results found.  Scheduled Meds: . [START ON 02/03/2015] amLODipine  2.5 mg Oral Daily  . aspirin EC  81 mg Oral Daily  . furosemide  20 mg Oral Daily  . heparin subcutaneous  5,000 Units Subcutaneous 3 times per day  . levothyroxine  50 mcg Oral QAC breakfast  . lisinopril  2.5 mg Oral Daily  . methotrexate  25 mg Oral Q Thu  . metoprolol tartrate  25 mg Oral BID  . pantoprazole  40 mg Oral Q1200  . polyethylene glycol  17 g Oral BID  . simvastatin  20 mg Oral QPM  . sodium chloride  3 mL Intravenous Q12H   Continuous Infusions:    Principal Problem:   Hyponatremia Active Problems:   Rheumatoid arthritis (HCC)   GERD (gastroesophageal reflux disease)   Thyroid disease   Coronary artery disease   S/P TAVR (transcatheter aortic valve replacement)   Thrombocytopenia (HCC)   Cholecystitis   Chronic cholecystitis   Chronic combined systolic and diastolic congestive heart failure (HCC)   Pressure ulcer    Time spent: 20 minutes.    Marcellus Scott, MD, FACP, FHM. [facility] Pager [phone]  If  7PM-7AM, please contact night-coverage [email or web address] Password [email or web address] 02/02/2015, 2:21 PM    LOS: 2 days

## 2015-02-03 LAB — BASIC METABOLIC PANEL
ANION GAP: 8 (ref 5–15)
BUN: 12 mg/dL (ref 6–20)
CHLORIDE: 87 mmol/L — AB (ref 101–111)
CO2: 26 mmol/L (ref 22–32)
Calcium: 8.5 mg/dL — ABNORMAL LOW (ref 8.9–10.3)
Creatinine, Ser: 0.83 mg/dL (ref 0.61–1.24)
GFR calc non Af Amer: >60 mL/min (ref >60)
Glucose, Bld: 92 mg/dL (ref 65–99)
Potassium: 4.5 mmol/L (ref 3.5–5.1)
Sodium: 121 mmol/L — ABNORMAL LOW (ref 135–145)

## 2015-02-03 MED ORDER — POLYETHYLENE GLYCOL 3350 17 G PO PACK: 17.0000 g | PACK | Freq: Two times a day (BID) | ORAL | Status: DC

## 2015-02-03 MED ORDER — LISINOPRIL 2.5 MG PO TABS: 2.5000 mg | ORAL_TABLET | Freq: Every day | ORAL | Status: DC

## 2015-02-03 MED ORDER — FUROSEMIDE 20 MG PO TABS: 20.0000 mg | ORAL_TABLET | Freq: Every day | ORAL | Status: DC

## 2015-02-03 MED ORDER — AMLODIPINE BESYLATE 5 MG PO TABS
2.5000 mg | ORAL_TABLET | Freq: Every day | ORAL | Status: DC
Start: 2015-02-03 — End: 2015-12-17

## 2015-02-03 MED ORDER — ACETAMINOPHEN 325 MG PO TABS: 650.0000 mg | ORAL_TABLET | Freq: Four times a day (QID) | ORAL | Status: DC | PRN

## 2015-02-03 MED ORDER — METOPROLOL TARTRATE 25 MG PO TABS: 25.0000 mg | ORAL_TABLET | Freq: Two times a day (BID) | ORAL | Status: DC

## 2015-02-03 NOTE — Consult Note (Signed)
Nephrology Consult Note   Patient name: Hunter Choi Medical record number: [id number] Date of birth: 18-Jul-1927 Age: 81 y.o. Gender: male  Primary Care Provider: Lonzo Cloud, Landry Mellow, MD  Pt Overview and Major Events to Date:  11/1 - Pt. Arrived for elective cholecystectomy. Found to have hyponatremia to 119 mmol/L on top of known chronic hyponatremia (normally 125 - 130). Admitted for management of hyponatremia before proceeding with surgery.  11/2 - Hyponatremia improved to 140mmol/L with Normal Saline Administration overnight. Cholecystectomy performed.  11/3-11/6: Good postoperative course. Hyponatremia initially normalizing. Intermittent Normal Saline administration with fluids completely stopped 11/5. Subsequent downtrending Na to 120 on 11/6 - 11/7. Fluid restriction 1200cc. Workup for hyponatremia started.   Pt. Is a 81 y/o M with hx of chronic hyponatremia, significant cardiac Hx CAD s/p CABG 1999, Aortic Stenosis s/p TAVR 2015, Combined CHF EF 40-45% 12/2014, AAA / BL Iliac Artery Aneurysm, PAD, Hy[pothyroidism, Rheumatoid Arthritis on Methotrexate, Chronic Iron Deficiency Anemia (hgb 10 - 11), Chronic Thrombocytopenia.   He presented to the hospital initially for elective surgery for acalculous cholecystitis initially managed with a perc drain. Surgery had to be postponed due to worsening of his hyponatremia. He was admitted to the medicine service. He subsequently underwent successful cholecystectomy. He was given normal saline during the initial portion of his hospitalization, but has not received any further fluids since 11/5. He says that he has had hyponatremia for some time " more than 5 years". He says it has been worked up in the past without any specific diagnosis being made. He is not on a fluid restriction at home. He says that he is not thirsty. He normally drinks 5-6 small glasses of water a day at home. He has not had a decrease in urine output. He says he urinates  frequently at night. He has not had any confusion, tremors, or cramps as a result of his low sodium. He has been on methotrexate for some time, and has been on the same dose of Synthroid for some time as well. He has not made any recent medication changes. He does not normally take any diuretics. He has not had any LE swelling or SOB. He has not had any diarrhea, or vomiting. He smoked a long time ago (quit 1960), his son smokes at home, but smokes outside. He does not have any significant second hand exposure otherwise.     Assessment and Plan: 81 y/o M with hx of chronic hyponatremia here with acute on chronic hyponatremia. S/P cholecystectomy POD 5, CHF, s/p TAVR, Hypothyroidism, Rheumatoid Arthritis on Methotrexate for years.   1. Hyponatremia: Euvolemic. Na 120 today. He came in at 75. Trended up to 126 with 0.9%NS administration. Chloride - 84, Urine Osmolality 414 too high for situation, Serum Osmolality 252, Ur Na - 92. Given Lasix 20mg  x 1 on 11/6. Somewhat consistent with SIADH. ?Hypothyroidism. SIADH in the past with reset osmostat given that this is chronic. Asymptomatic.  - Na 121 this am. Improved slightly. Pt. Feeling well.  - TSH - 4.35. High for him. Needs outpatient follow up and recheck with adjustment of synthroid as needed.  - Continue Fluid restriction at discharge. - Daily Lasix 20mg   - Trend Daily BMET - decreased dose of norvasc given low BP's.  - If improving may need to continue follow up as an outpatient as he is asymptomatic with chronic hyponatremia.  Per Dr. 13/6, likely initially hopovolemic hyponatremia on top of SIADH with SIADH component showing more after surgery.  I would be comfortable at this point for him to go home- get labs thru home health every 2-3 days for about 10 days.  Since he lives in the high 120's he is near his baseline.  I do not think he needs nephrology specific follow up for this issue- continue lasix 20 daily and consider increasing his   Synthroid.  Renal will sign off  2. S/p Cholecystectomy - post op day 5. Doing well.   3. Hypothyroidism - last TSH - 1.4. On of Synthroid daily. TSH as above. Needs outpatient follow up and management.   4. Rheumatoid - On Methotrexate for years. Continue here. Physical exam consistent with this.  5. CHF Combined - Last EF 40-45%. No exacerbation at this time, and decent cardiac output. Unlikely contributing to Hyponatremia.   6. Chronic Iron Deficiency Anemia / Thrombocytopenia - Baseline. Stable.   7/ AAA / Iliac Aneurysm - nothing to do at this time. Following with Vascular.    Objective: Temp:  [98.2 F (36.8 C)-98.3 F (36.8 C)] 98.2 F (36.8 C) (11/08 0453) Pulse Rate:  [60-68] 68 (11/08 0453) Resp:  [18] 18 (11/08 0453) BP: (120-136)/(51-58) 136/55 mmHg (11/08 0453) SpO2:  [97 %-98 %] 98 % (11/08 0453) Weight:  [132 lb 11.5 oz (60.2 kg)] 132 lb 11.5 oz (60.2 kg) (11/08 0453) Physical Exam: General: NAD, resting comfortably in bed. Eating breakfast.  Cardiovascular: RRR, Systolic Ejection Murmur, 2+ distal pulses.  Respiratory: CTA Bilaterally  Abdomen: S, NT, ND Extremities: WWP, no edema.  Neuro: No focal deficits Psych: Appropriate mood / affect.   Laboratory:  Recent Labs Lab 01/30/15 0303 01/31/15 0428 02/02/15 0440  WBC 6.4 5.6 5.0  HGB 10.7* 10.7* 10.6*  HCT 32.5* 32.9* 32.0*  PLT 114* 136* 164    Recent Labs Lab 01/29/15 0433  01/31/15 0428  02/01/15 1634 02/02/15 0440 02/03/15 0352  NA 127*  < > 122*  < > 120* 120* 121*  K 3.8  < > 4.0  < > 4.2 4.2 4.5  CL 94*  < > 90*  < > 84* 84* 87*  CO2 24  < > 25  < > 25 27 26   BUN 8  < > 8  < > 11 12 12   CREATININE 0.79  < > 0.70  < > 0.84 0.78 0.83  CALCIUM 8.1*  < > 8.2*  < > 8.7* 8.5* 8.5*  PROT 6.1*  --  5.7*  --   --   --   --   BILITOT 0.4  --  0.7  --   --   --   --   ALKPHOS 61  --  63  --   --   --   --   ALT 20  --  21  --   --   --   --   AST 43*  --  41  --   --   --   --    GLUCOSE 118*  < > 94  < > 106* 103* 92  < > = values in this interval not displayed.   , MD 02/03/2015, 8:23 AM PGY-2  Patient seen and examined, agree with above note with above modifications. Sodium up one point with measures- aniticipate will continue to improve and not too far off baseline.  Send home on current meds but consider increase of synthroid.  Would recommend labs q 2-3 days for 10 days- home health would likely be the best  bet- to be handles by his PCP- renal will sign off Annie Sable, MD 02/03/2015

## 2015-02-03 NOTE — Discharge Summary (Signed)
Physician Discharge Summary  MALE MEATH YTW:[id number] DOB: Jan 03, 1928 DOA: 01/27/2015  PCP: Tommy Rainwater, MD  Admit date: 01/27/2015 Discharge date: 02/03/2015  Time spent: Greater than 30 minutes  Recommendations for Outpatient Follow-up:  1. Dr. Tommy Rainwater, PCP on 02/06/2015 at 11:15 AM with repeat labs (CBC & BMP). Will need follow-up of BMP every 2-3 days for 10 days. Consider increasing Synthroid as outpatient. 2. Dr. Gaynelle Adu, General Surgery on 02/12/2015 at 4:30 PM.  Discharge Diagnoses:  Principal Problem:   Hyponatremia Active Problems:   Rheumatoid arthritis (HCC)   GERD (gastroesophageal reflux disease)   Thyroid disease   Coronary artery disease   S/P TAVR (transcatheter aortic valve replacement)   Thrombocytopenia (HCC)   Cholecystitis   Chronic cholecystitis   Chronic combined systolic and diastolic congestive heart failure (HCC)   Pressure ulcer   Discharge Condition: Improved & Stable  Diet recommendation: Heart Healthy diet and fluid restriction 1200 mls/day.  Filed Weights   02/01/15 0500 02/02/15 0509 02/03/15 0453  Weight: 61.281 kg (135 lb 1.6 oz) 60.737 kg (133 lb 14.4 oz) 60.2 kg (132 lb 11.5 oz)    History of present illness:  80 year old male with history of CAD, HTN, HLD, chronic hyponatremia (baseline sodium in the mid 120s), chronic thrombocytopenia, hospitalized August 2016 for acalculous cholecystitis, UTI, elevated troponin secondary to demand ischemia and had percutaneous cholecystostomy placed and discharged home on antibiotics. Since then cardiology cleared patient for surgery and patient arrived on 11/1 for elective cholecystectomy but was found to have sodium of 119. Status post laparoscopic cholecystectomy-has done well. Discharge delayed secondary to worsening hyponatremia.  Hospital Course:   Hyponatremia, subacute on chronic-likely SIADH. - Baseline sodium since 2012 has ranged between 125-135.  Admitted with sodium of 119. - Etiology of his chronic hyponatremia is unclear. No prior urine or serum osmolarity results in chart. Patient is aware of his chronic hypo-natremia. Does not give any history of fluid restriction advice. - This admission, was empirically treated with gentle IV normal saline hydration and sodium had improved from 119 on 11/1 to 127 on 11/3 but since then again has decreased down to 120. - Serum osmolarity: 252 and urine osmolarity 414. - Initiated fluid restriction 1200 mL per day and provided with Lasix 20 mg IV 2 doses on 11/6. Despite these measures and good diuresis, sodium persists at 120 albeit asymptomatic. - Nephrology consultation appreciated: Indicated that admission/preop low sodium was probably due to hypovolemic hyponatremia and improved on normal saline administration. Once he got euvolemic, SIADH resurfaced and worsened without fluid restriction. Continuing Lasix and follow BMP in a.m. - Sodium: 121 today. Nephrology follow-up appreciated. Discussed with Dr. York Grice to discharge home with repeat BMP every 2-3 days for about 10 days. Anticipate sodium will continue to improve and not too far from baseline. Continue Lasix 20 mg daily. Consider increasing his Synthroid as outpatient with follow-up of TSH.  Acalculous cholecystitis - Patient had percutaneous cholecystostomy placed in August 2016.  - Status post laparoscopic cholecystectomy 11/2. Stable. Has OP f/u with Surgery.  Chronic combined systolic & diastolic CHF - LVEF 40-45 percent by echo recently. - Compensated.   Cardiomyopathy/NSVT - 2-D echo 01/07/15: LVEF 40-45 percent, diffuse hypokinesis and grade 1 diastolic dysfunction. Bioprosthetic aortic valve. Mild MR. EF was unchanged compared to echo 01/13/14 - Replace potassium >4 and magnesium >2 - Added lisinopril 2.5 MG daily. Increase metoprolol to 25 mg PO BID - Reviewed the telemetry monitor from 11/3: Not sure if  it was truly  NSVT or just PVCs. Replaced magnesium.  - Stable without any further episodes of NSVT  Hyperlipidemia - Zocor  Essential hypertension - Controlled. Amlodipine decreased on 11/7 to avoid hypotension.  History of aortic stenosis, status post TAVR 2015  PAD / PVD.  AAA / bilateral common iliac artery aneurysms. Not candidate for repair but stable in size at time of last follow up at with Vascular June 2016.   Hypothyroidism -continue Synthroid. TSH 4.35. Consider increasing Synthroid as outpatient.  Rheumatoid Arthritis  -on Methotrexate.   GERD,  -continue daily PPI-start in am  CAD / CABG 1999.   Chronic anemia & thrombocytopenia,  - stable.   Discontinued Valium which he hadn't been in hospital for the last 1 week without adverse events.   Discussed with patient's son Mr. [name]. Updated care and answered questions.   Consultants:  General surgery  Nephrology  Procedures:  Has percutaneous cholecystostomy tube from prior to admission  Laparoscopic Cholecystectomy with IOC Procedure Note 11/2  Antibiotics:  None   Discharge Exam:  Complaints:  Denies complaints.  Filed Vitals:   02/02/15 1200 02/02/15 1941 02/03/15 0453 02/03/15 0950  BP: 129/58 120/51 136/55 130/60  Pulse: 61 60 68   Temp: 98.3 F (36.8 C) 98.3 F (36.8 C) 98.2 F (36.8 C)   TempSrc: Oral Oral Oral   Resp:  18 18   Height:      Weight:   60.2 kg (132 lb 11.5 oz)   SpO2: 97% 98% 98%     General exam: Pleasant elderly male sitting up in bed.  Respiratory system: Clear. No increased work of breathing. Cardiovascular system: S1 & S2 heard, RRR. No JVD, murmurs, gallops, clicks or pedal edema.  Gastrointestinal system: Abdomen is nondistended, soft and nontender. Normal bowel sounds heard. Laparoscopic sites without acute findings. Central nervous system: Alert and oriented. No focal neurological deficits. Extremities: Symmetric 5 x 5 power.  Discharge  Instructions      Discharge Instructions    Call MD for:  difficulty breathing, headache or visual disturbances    Complete by:  As directed      Call MD for:  extreme fatigue    Complete by:  As directed      Call MD for:  hives    Complete by:  As directed      Call MD for:  persistant dizziness or light-headedness    Complete by:  As directed      Call MD for:  persistant nausea and vomiting    Complete by:  As directed      Call MD for:  redness, tenderness, or signs of infection (pain, swelling, redness, odor or green/yellow discharge around incision site)    Complete by:  As directed      Call MD for:  severe uncontrolled pain    Complete by:  As directed      Call MD for:  temperature >100.4    Complete by:  As directed      Diet - low sodium heart healthy    Complete by:  As directed      Discharge instructions    Complete by:  As directed   Fluid restriction: Do not drink more than 1200 mls of liquids per day.     Increase activity slowly    Complete by:  As directed             Medication List    STOP taking these  medications        diazepam 5 MG tablet  Commonly known as:  VALIUM      TAKE these medications        acetaminophen 325 MG tablet  Commonly known as:  TYLENOL  Take 2 tablets (650 mg total) by mouth every 6 (six) hours as needed for mild pain, moderate pain, fever or headache.     amLODipine 5 MG tablet  Commonly known as:  NORVASC  Take 0.5 tablets (2.5 mg total) by mouth daily at 3 pm.     aspirin 81 MG EC tablet  Take 1 tablet (81 mg total) by mouth daily.     folic acid 1 MG tablet  Commonly known as:  FOLVITE  Take 3 mg by mouth daily.     furosemide 20 MG tablet  Commonly known as:  LASIX  Take 1 tablet (20 mg total) by mouth daily.     levothyroxine 50 MCG tablet  Commonly known as:  SYNTHROID, LEVOTHROID  Take 50 mcg by mouth daily.     lisinopril 2.5 MG tablet  Commonly known as:  PRINIVIL,ZESTRIL  Take 1 tablet (2.5 mg  total) by mouth daily.     methotrexate 2.5 MG tablet  Take 25 mg by mouth once a week. Every Thursday     metoprolol tartrate 25 MG tablet  Commonly known as:  LOPRESSOR  Take 1 tablet (25 mg total) by mouth 2 (two) times daily.     omeprazole 20 MG capsule  Commonly known as:  PRILOSEC  Take 20 mg by mouth daily.     polyethylene glycol packet  Commonly known as:  MIRALAX / GLYCOLAX  Take 17 g by mouth 2 (two) times daily.     simvastatin 20 MG tablet  Commonly known as:  ZOCOR  Take 20 mg by mouth every evening.       Follow-up Information    Follow up with Atilano Ina, MD On 02/12/2015.   Specialty:  General Surgery   Why:  4:45pm, arrive by 4:30pm   Contact information:   [address] [place] [address] [phone]       Follow up with Shamleffer, Landry Mellow, MD. Schedule an appointment as soon as possible for a visit in 3 days.   Specialty:  Internal Medicine   Why:  to be seen with repeat labs (CBC & BMP).   Contact information:   [address]  [address] [place] [address] [phone]        The results of significant diagnostics from this hospitalization (including imaging, microbiology, ancillary and laboratory) are listed below for reference.    Significant Diagnostic Studies: Dg Cholangiogram Operative  01/28/2015  CLINICAL DATA:  Old male with a history of cholelithiasis. EXAM: INTRAOPERATIVE CHOLANGIOGRAM TECHNIQUE: Cholangiographic images from the C-arm fluoroscopic device were submitted for interpretation post-operatively. Please see the procedural report for the amount of contrast and the fluoroscopy time utilized. COMPARISON:  01/01/2015 FINDINGS: Surgical instruments project over the upper abdomen. Distal aspect a pigtail catheter projects in the region of the gallbladder fossa. This has been fractured in the mid drain. There is cannulation of the cystic duct/gallbladder neck, with antegrade infusion of contrast. Caliber  of the extrahepatic ductal system within normal limits. No large filling defect identified. Free flow of contrast across the ampulla. IMPRESSION: Intraoperative cholangiogram demonstrates extrahepatic biliary ducts of unremarkable caliber, with no large filling defect identified. Free flow of contrast across the ampulla.  Retained foreign body, with the distal aspect at pigtail catheter projecting in the region of the gallbladder fossa. Correlation with gross pathology recommended, to assure that this is removed with the gallbladder. Please refer to the dictated operative report for full details of intraoperative findings and procedure Signed, Yvone Neu. Loreta Ave, DO [facility] Electronically Signed   By: Gilmer Mor D.O.   On: 01/28/2015 15:55    Microbiology: Recent Results (from the past 240 hour(s))  Surgical pcr screen     Status: None   Collection Time: 01/28/15 12:18 PM  Result Value Ref Range Status   MRSA, PCR NEGATIVE NEGATIVE Final   Staphylococcus aureus NEGATIVE NEGATIVE Final    Comment:        The Xpert SA Assay (FDA approved for NASAL specimens in patients over 53 years of age), is one component of a comprehensive surveillance program.  Test performance has been validated by [facility] for patients greater than or equal to 63 year old. It is not intended to diagnose infection nor to guide or monitor treatment.      Labs: Basic Metabolic Panel:  Recent Labs Lab 01/29/15 0433  01/31/15 0428 01/31/15 0815 02/01/15 0154 02/01/15 1634 02/02/15 0440 02/03/15 0352  NA 127*  < > 122*  --  121* 120* 120* 121*  K 3.8  < > 4.0  --  4.3 4.2 4.2 4.5  CL 94*  < > 90*  --  88* 84* 84* 87*  CO2 24  < > 25  --  27 25 27 26   GLUCOSE 118*  < > 94  --  99 106* 103* 92  BUN 8  < > 8  --  9 11 12 12   CREATININE 0.79  < > 0.70  --  0.68 0.84 0.78 0.83  CALCIUM 8.1*  < > 8.2*  --  8.2* 8.7* 8.5* 8.5*  MG 1.4*  --   --   1.4* 1.9  --   --   --   < > = values in this interval not displayed. Liver Function Tests:  Recent Labs Lab 01/29/15 0433 01/31/15 0428  AST 43* 41  ALT 20 21  ALKPHOS 61 63  BILITOT 0.4 0.7  PROT 6.1* 5.7*  ALBUMIN 3.2* 2.8*   No results for input(s): LIPASE, AMYLASE in the last 168 hours. No results for input(s): AMMONIA in the last 168 hours. CBC:  Recent Labs Lab 01/29/15 0433 01/30/15 0303 01/31/15 0428 02/02/15 0440  WBC 7.3 6.4 5.6 5.0  HGB 10.7* 10.7* 10.7* 10.6*  HCT 32.5* 32.5* 32.9* 32.0*  MCV 87.8 87.4 85.7 84.4  PLT 103* 114* 136* 164   Cardiac Enzymes: No results for input(s): CKTOTAL, CKMB, CKMBINDEX, TROPONINI in the last 168 hours. BNP: BNP (last 3 results) No results for input(s): BNP in the last 8760 hours.  ProBNP (last 3 results) No results for input(s): PROBNP in the last 8760 hours.  CBG: No results for input(s): GLUCAP in the last 168 hours.    Signed:  13/05/16, MD, FACP, FHM. [facility] Pager [phone]  If 7PM-7AM, please contact night-coverage [email or web address] Password [email or web address] 02/03/2015, 11:05 AM

## 2015-02-03 NOTE — Discharge Summary (Signed)
Pt got discharged, discharge instructions provided and patient showed understanding to it, IV taken out,Telemonitor DC,pt left unit in wheelchair with all of the belongings. 

## 2015-02-03 NOTE — Discharge Instructions (Signed)
[facility] ______[facility] °LAPAROSCOPIC SURGERY: POST OP INSTRUCTIONS °Always review your discharge instruction sheet given to you by the facility where your surgery was performed. °IF YOU HAVE DISABILITY OR FAMILY LEAVE FORMS, YOU MUST BRING THEM TO THE OFFICE FOR PROCESSING.   °DO NOT GIVE THEM TO YOUR DOCTOR. ° °1. A prescription for pain medication may be given to you upon discharge.  Take your pain medication as prescribed, if needed.  If narcotic pain medicine is not needed, then you may take acetaminophen (Tylenol) or ibuprofen (Advil) as needed. °2. Take your usually prescribed medications unless otherwise directed. °3. If you need a refill on your pain medication, please contact your pharmacy.  They will contact our office to request authorization. Prescriptions will not be filled after 5pm or on week-ends. °4. You should follow a light diet the first few days after arrival home, such as soup and crackers, etc.  Be sure to include lots of fluids daily. °5. Most patients will experience some swelling and bruising in the area of the incisions.  Ice packs will help.  Swelling and bruising can take several days to resolve.  °6. It is common to experience some constipation if taking pain medication after surgery.  Increasing fluid intake and taking a stool softener (such as Colace) will usually help or prevent this problem from occurring.  A mild laxative (Milk of Magnesia or Miralax) should be taken according to package instructions if there are no bowel movements after 48 hours. °7. Unless discharge instructions indicate otherwise, you may remove your bandages 24-48 hours after surgery, and you may shower at that time.  You may have steri-strips (small skin tapes) in place directly over the incision.  These strips should be left on the skin for 7-10 days.  If your surgeon used skin glue on the incision, you may shower in 24 hours.  The glue will flake off over the next 2-3 weeks.  Any sutures or  staples will be removed at the office during your follow-up visit. °8. ACTIVITIES:  You may resume regular (light) daily activities beginning the next day--such as daily self-care, walking, climbing stairs--gradually increasing activities as tolerated.  You may have sexual intercourse when it is comfortable.  Refrain from any heavy lifting or straining until approved by your doctor. °a. You may drive when you are no longer taking prescription pain medication, you can comfortably wear a seatbelt, and you can safely maneuver your car and apply brakes. °b. RETURN TO WORK:  __________________________________________________________ °9. You should see your doctor in the office for a follow-up appointment approximately 2-3 weeks after your surgery.  Make sure that you call for this appointment within a day or two after you arrive home to insure a convenient appointment time. °10. OTHER INSTRUCTIONS: __________________________________________________________________________________________________________________________ __________________________________________________________________________________________________________________________ °WHEN TO CALL YOUR DOCTOR: °1. Fever over 101.0 °2. Inability to urinate °3. Continued bleeding from incision. °4. Increased pain, redness, or drainage from the incision. °5. Increasing abdominal pain ° °The clinic staff is available to answer your questions during regular business hours.  Please don’t hesitate to call and ask to speak to one of the nurses for clinical concerns.  If you have a medical emergency, go to the nearest emergency room or call 911.  A surgeon from [facility] is always on call at the hospital. °[address], [address], [place], [place]  [address] ? [address] [address], [place],    [address] °[phone] ? [phone] ? FAX [phone] °Web site:   [email or web address]   Hyponatremia Hyponatremia is when the amount of salt  (sodium) in your blood is too low. When sodium levels are low, your cells absorb extra water and they swell. The swelling happens throughout the body, but it mostly affects the brain. CAUSES This condition may be caused by:  Heart, kidney, or liver problems.  Thyroid problems.  Adrenal gland problems.  Metabolic conditions, such as syndrome of inappropriate antidiuretic hormone (SIADH).  Severe vomiting and diarrhea.  Certain medicines or illegal drugs.  Dehydration.  Drinking too much water.  Eating a diet that is low in sodium.  Large burns on your body.  Sweating. RISK FACTORS This condition is more likely to develop in people who:  Have long-term (chronic) kidney disease.  Have heart failure.  Have a medical condition that causes frequent or excessive diarrhea.  Have metabolic conditions, such as Addison disease or SIADH.  Take certain medicines that affect the sodium and fluid balance in the blood. Some of these medicine types include:  Diuretics.  NSAIDs.  Some opioid pain medicines.  Some antidepressants.  Some seizure prevention medicines. SYMPTOMS  Symptoms of this condition include:  Nausea and vomiting.  Confusion.  Lethargy.  Agitation.  Headache.  Seizures.  Unconsciousness.  Appetite loss.  Muscle weakness and cramping.  Feeling weak or light-headed.  Having a rapid heart rate.  Fainting, in severe cases. DIAGNOSIS This condition is diagnosed with a medical history and physical exam. You will also have other tests, including:  Blood tests.  Urine tests. TREATMENT Treatment for this condition depends on the cause. Treatment may include:  Fluids given through an IV tube that is inserted into one of your veins.  Medicines to correct the sodium imbalance. If medicines are causing the condition, the medicines will need to be adjusted.  Limiting water or fluid intake to get the correct sodium balance. HOME CARE  INSTRUCTIONS  Take medicines only as directed by your health care provider. Many medicines can make this condition worse. Talk with your health care provider about any medicines that you are currently taking.  Carefully follow a recommended diet as directed by your health care provider.  Carefully follow instructions from your health care provider about fluid restrictions.  Keep all follow-up visits as directed by your health care provider. This is important.  Do not drink alcohol. SEEK MEDICAL CARE IF:  You develop worsening nausea, fatigue, headache, confusion, or weakness.  Your symptoms go away and then return.  You have problems following the recommended diet. SEEK IMMEDIATE MEDICAL CARE IF:  You have a seizure.  You faint.  You have ongoing diarrhea or vomiting.   This information is not intended to replace advice given to you by your health care provider. Make sure you discuss any questions you have with your health care provider.   Document Released: 03/04/2002 Document Revised: 07/29/2014 Document Reviewed: 04/03/2014 Elsevier Interactive Patient Education [employer]

## 2015-02-03 NOTE — Care Management Important Message (Signed)
Important Message  Patient Details  Name: Hunter Choi MRN: [id number] Date of Birth: July 05, 1927   Medicare Important Message Given:  Yes-second notification given    Orson Aloe 02/03/2015, 1:39 PM

## 2015-02-10 ENCOUNTER — Other Ambulatory Visit: Payer: Self-pay | Admitting: Internal Medicine

## 2015-02-13 NOTE — Anesthesia Postprocedure Evaluation (Signed)
  Anesthesia Post-op Note  Patient: Hunter Choi  Procedure(s) Performed: Procedure(s): LAPAROSCOPIC CHOLECYSTECTOMY WITH INTRAOPERATIVE CHOLANGIOGRAM (N/A)  Patient Location: PACU  Anesthesia Type:General  Level of Consciousness: awake, alert  and oriented  Airway and Oxygen Therapy: Patient Spontanous Breathing and Patient connected to nasal cannula oxygen  Post-op Pain: mild  Post-op Assessment: Post-op Vital signs reviewed, Patient's Cardiovascular Status Stable, Respiratory Function Stable, Patent Airway and Pain level controlled              Post-op Vital Signs: stable  Last Vitals:  Filed Vitals:   02/03/15 0950  BP: 130/60  Pulse:   Temp:   Resp:     Complications: No apparent anesthesia complications

## 2015-02-14 ENCOUNTER — Other Ambulatory Visit: Payer: Self-pay | Admitting: Internal Medicine

## 2015-03-05 ENCOUNTER — Encounter: Payer: Self-pay | Admitting: Vascular Surgery

## 2015-03-08 ENCOUNTER — Other Ambulatory Visit: Payer: Self-pay | Admitting: Internal Medicine

## 2015-03-10 ENCOUNTER — Other Ambulatory Visit: Payer: Self-pay | Admitting: Family

## 2015-03-10 DIAGNOSIS — I714 Abdominal aortic aneurysm, without rupture, unspecified: Secondary | ICD-10-CM

## 2015-03-10 DIAGNOSIS — I723 Aneurysm of iliac artery: Secondary | ICD-10-CM

## 2015-03-11 ENCOUNTER — Other Ambulatory Visit (HOSPITAL_COMMUNITY): Payer: Medicare Other

## 2015-03-11 ENCOUNTER — Ambulatory Visit: Payer: Medicare Other | Admitting: Vascular Surgery

## 2015-05-01 ENCOUNTER — Emergency Department (HOSPITAL_COMMUNITY): Payer: Medicare Other

## 2015-05-01 ENCOUNTER — Inpatient Hospital Stay (HOSPITAL_COMMUNITY)
Admission: EM | Admit: 2015-05-01 | Discharge: 2015-05-11 | DRG: 286 | Disposition: A | Discharge status: Home / Self Care | Payer: Medicare Other | Attending: Internal Medicine | Admitting: Internal Medicine

## 2015-05-01 ENCOUNTER — Encounter: Payer: Self-pay | Admitting: Vascular Surgery

## 2015-05-01 ENCOUNTER — Encounter (HOSPITAL_COMMUNITY): Payer: Self-pay | Admitting: Family Medicine

## 2015-05-01 DIAGNOSIS — I519 Heart disease, unspecified: Secondary | ICD-10-CM | POA: Insufficient documentation

## 2015-05-01 DIAGNOSIS — N400 Enlarged prostate without lower urinary tract symptoms: Secondary | ICD-10-CM | POA: Diagnosis present

## 2015-05-01 DIAGNOSIS — E871 Hypo-osmolality and hyponatremia: Secondary | ICD-10-CM | POA: Diagnosis not present

## 2015-05-01 DIAGNOSIS — H9193 Unspecified hearing loss, bilateral: Secondary | ICD-10-CM | POA: Diagnosis present

## 2015-05-01 DIAGNOSIS — I69312 Visuospatial deficit and spatial neglect following cerebral infarction: Secondary | ICD-10-CM | POA: Diagnosis not present

## 2015-05-01 DIAGNOSIS — N182 Chronic kidney disease, stage 2 (mild): Secondary | ICD-10-CM | POA: Diagnosis present

## 2015-05-01 DIAGNOSIS — I25708 Atherosclerosis of coronary artery bypass graft(s), unspecified, with other forms of angina pectoris: Secondary | ICD-10-CM | POA: Diagnosis not present

## 2015-05-01 DIAGNOSIS — I5043 Acute on chronic combined systolic (congestive) and diastolic (congestive) heart failure: Secondary | ICD-10-CM | POA: Diagnosis present

## 2015-05-01 DIAGNOSIS — N179 Acute kidney failure, unspecified: Secondary | ICD-10-CM | POA: Diagnosis not present

## 2015-05-01 DIAGNOSIS — E222 Syndrome of inappropriate secretion of antidiuretic hormone: Secondary | ICD-10-CM | POA: Diagnosis present

## 2015-05-01 DIAGNOSIS — I25810 Atherosclerosis of coronary artery bypass graft(s) without angina pectoris: Secondary | ICD-10-CM | POA: Diagnosis present

## 2015-05-01 DIAGNOSIS — R739 Hyperglycemia, unspecified: Secondary | ICD-10-CM | POA: Diagnosis present

## 2015-05-01 DIAGNOSIS — I714 Abdominal aortic aneurysm, without rupture, unspecified: Secondary | ICD-10-CM | POA: Diagnosis present

## 2015-05-01 DIAGNOSIS — R079 Chest pain, unspecified: Secondary | ICD-10-CM | POA: Diagnosis present

## 2015-05-01 DIAGNOSIS — H9190 Unspecified hearing loss, unspecified ear: Secondary | ICD-10-CM | POA: Insufficient documentation

## 2015-05-01 DIAGNOSIS — I5033 Acute on chronic diastolic (congestive) heart failure: Secondary | ICD-10-CM | POA: Insufficient documentation

## 2015-05-01 DIAGNOSIS — I2582 Chronic total occlusion of coronary artery: Secondary | ICD-10-CM | POA: Diagnosis present

## 2015-05-01 DIAGNOSIS — Z96653 Presence of artificial knee joint, bilateral: Secondary | ICD-10-CM | POA: Diagnosis present

## 2015-05-01 DIAGNOSIS — Z952 Presence of prosthetic heart valve: Secondary | ICD-10-CM

## 2015-05-01 DIAGNOSIS — R0602 Shortness of breath: Secondary | ICD-10-CM | POA: Diagnosis present

## 2015-05-01 DIAGNOSIS — Y832 Surgical operation with anastomosis, bypass or graft as the cause of abnormal reaction of the patient, or of later complication, without mention of misadventure at the time of the procedure: Secondary | ICD-10-CM | POA: Diagnosis present

## 2015-05-01 DIAGNOSIS — I5023 Acute on chronic systolic (congestive) heart failure: Secondary | ICD-10-CM | POA: Diagnosis not present

## 2015-05-01 DIAGNOSIS — M069 Rheumatoid arthritis, unspecified: Secondary | ICD-10-CM | POA: Diagnosis present

## 2015-05-01 DIAGNOSIS — Z7952 Long term (current) use of systemic steroids: Secondary | ICD-10-CM

## 2015-05-01 DIAGNOSIS — I959 Hypotension, unspecified: Secondary | ICD-10-CM | POA: Diagnosis present

## 2015-05-01 DIAGNOSIS — E861 Hypovolemia: Secondary | ICD-10-CM | POA: Diagnosis not present

## 2015-05-01 DIAGNOSIS — E079 Disorder of thyroid, unspecified: Secondary | ICD-10-CM

## 2015-05-01 DIAGNOSIS — E039 Hypothyroidism, unspecified: Secondary | ICD-10-CM | POA: Diagnosis present

## 2015-05-01 DIAGNOSIS — K219 Gastro-esophageal reflux disease without esophagitis: Secondary | ICD-10-CM | POA: Diagnosis present

## 2015-05-01 DIAGNOSIS — E785 Hyperlipidemia, unspecified: Secondary | ICD-10-CM | POA: Diagnosis present

## 2015-05-01 DIAGNOSIS — Z87891 Personal history of nicotine dependence: Secondary | ICD-10-CM

## 2015-05-01 DIAGNOSIS — H5442 Blindness, left eye, normal vision right eye: Secondary | ICD-10-CM | POA: Diagnosis present

## 2015-05-01 DIAGNOSIS — I509 Heart failure, unspecified: Secondary | ICD-10-CM

## 2015-05-01 DIAGNOSIS — I248 Other forms of acute ischemic heart disease: Secondary | ICD-10-CM | POA: Diagnosis present

## 2015-05-01 DIAGNOSIS — T82898A Other specified complication of vascular prosthetic devices, implants and grafts, initial encounter: Secondary | ICD-10-CM | POA: Diagnosis present

## 2015-05-01 DIAGNOSIS — Z7982 Long term (current) use of aspirin: Secondary | ICD-10-CM

## 2015-05-01 DIAGNOSIS — I130 Hypertensive heart and chronic kidney disease with heart failure and stage 1 through stage 4 chronic kidney disease, or unspecified chronic kidney disease: Secondary | ICD-10-CM | POA: Diagnosis present

## 2015-05-01 DIAGNOSIS — Z96642 Presence of left artificial hip joint: Secondary | ICD-10-CM | POA: Diagnosis present

## 2015-05-01 DIAGNOSIS — I2510 Atherosclerotic heart disease of native coronary artery without angina pectoris: Secondary | ICD-10-CM | POA: Diagnosis present

## 2015-05-01 DIAGNOSIS — I255 Ischemic cardiomyopathy: Secondary | ICD-10-CM | POA: Diagnosis present

## 2015-05-01 HISTORY — DX: Paroxysmal atrial fibrillation: I48.0

## 2015-05-01 LAB — CBC
HCT: 39.2 % (ref 39.0–52.0)
HEMOGLOBIN: 12.5 g/dL — AB (ref 13.0–17.0)
MCH: 29.8 pg (ref 26.0–34.0)
MCHC: 31.9 g/dL (ref 30.0–36.0)
MCV: 93.6 fL (ref 78.0–100.0)
Platelets: 172 10*3/uL (ref 150–400)
RBC: 4.19 MIL/uL — ABNORMAL LOW (ref 4.22–5.81)
RDW: 17 % — ABNORMAL HIGH (ref 11.5–15.5)
WBC: 9.4 10*3/uL (ref 4.0–10.5)

## 2015-05-01 LAB — TSH: TSH: 3.335 u[IU]/mL (ref 0.350–4.500)

## 2015-05-01 LAB — I-STAT TROPONIN, ED: Troponin i, poc: 0.05 ng/mL (ref 0.00–0.08)

## 2015-05-01 LAB — TROPONIN I: TROPONIN I: 0.46 ng/mL — AB (ref <0.031)

## 2015-05-01 LAB — BASIC METABOLIC PANEL
ANION GAP: 14 (ref 5–15)
BUN: 17 mg/dL (ref 6–20)
CALCIUM: 9 mg/dL (ref 8.9–10.3)
CO2: 22 mmol/L (ref 22–32)
Chloride: 96 mmol/L — ABNORMAL LOW (ref 101–111)
Creatinine, Ser: 1.03 mg/dL (ref 0.61–1.24)
GLUCOSE: 161 mg/dL — AB (ref 65–99)
Potassium: 4.9 mmol/L (ref 3.5–5.1)
Sodium: 132 mmol/L — ABNORMAL LOW (ref 135–145)

## 2015-05-01 LAB — BRAIN NATRIURETIC PEPTIDE: B Natriuretic Peptide: >4500 pg/mL — ABNORMAL HIGH (ref 0.0–100.0)

## 2015-05-01 LAB — GLUCOSE, CAPILLARY: Glucose-Capillary: 107 mg/dL — ABNORMAL HIGH (ref 65–99)

## 2015-05-01 MED ORDER — INSULIN ASPART 100 UNIT/ML ~~LOC~~ SOLN: 0.0000 [IU] | Freq: Three times a day (TID) | SUBCUTANEOUS | Status: DC

## 2015-05-01 MED ORDER — LISINOPRIL 2.5 MG PO TABS
2.5000 mg | ORAL_TABLET | Freq: Every day | ORAL | Status: DC
  Administered 2015-05-02 – 2015-05-07 (×6): 2.5 mg via ORAL
  Filled 2015-05-01 (×6): qty 1

## 2015-05-01 MED ORDER — ASPIRIN EC 81 MG PO TBEC
81.0000 mg | DELAYED_RELEASE_TABLET | Freq: Every day | ORAL | Status: DC
  Administered 2015-05-02 – 2015-05-11 (×10): 81 mg via ORAL
  Filled 2015-05-01 (×10): qty 1

## 2015-05-01 MED ORDER — FUROSEMIDE 10 MG/ML IJ SOLN
40.0000 mg | Freq: Two times a day (BID) | INTRAMUSCULAR | Status: DC
  Administered 2015-05-01 – 2015-05-03 (×4): 40 mg via INTRAVENOUS
  Filled 2015-05-01 (×3): qty 4

## 2015-05-01 MED ORDER — ONDANSETRON HCL 4 MG PO TABS: 4.0000 mg | ORAL_TABLET | Freq: Four times a day (QID) | ORAL | Status: DC | PRN

## 2015-05-01 MED ORDER — ENOXAPARIN SODIUM 30 MG/0.3ML ~~LOC~~ SOLN
30.0000 mg | SUBCUTANEOUS | Status: DC
  Administered 2015-05-01 – 2015-05-06 (×6): 30 mg via SUBCUTANEOUS
  Filled 2015-05-01 (×6): qty 0.3

## 2015-05-01 MED ORDER — METHOTREXATE 2.5 MG PO TABS
25.0000 mg | ORAL_TABLET | ORAL | Status: DC
  Administered 2015-05-01 – 2015-05-08 (×2): 25 mg via ORAL
  Filled 2015-05-01 (×2): qty 10

## 2015-05-01 MED ORDER — SODIUM CHLORIDE 0.9% FLUSH
3.0000 mL | Freq: Two times a day (BID) | INTRAVENOUS | Status: DC
  Administered 2015-05-01 – 2015-05-11 (×14): 3 mL via INTRAVENOUS

## 2015-05-01 MED ORDER — POLYETHYLENE GLYCOL 3350 17 G PO PACK
17.0000 g | PACK | Freq: Two times a day (BID) | ORAL | Status: DC
  Administered 2015-05-01 – 2015-05-09 (×11): 17 g via ORAL
  Filled 2015-05-01 (×12): qty 1

## 2015-05-01 MED ORDER — METOPROLOL TARTRATE 25 MG PO TABS
25.0000 mg | ORAL_TABLET | Freq: Two times a day (BID) | ORAL | Status: DC
  Administered 2015-05-01 – 2015-05-04 (×7): 25 mg via ORAL
  Filled 2015-05-01 (×5): qty 1
  Filled 2015-05-01: qty 2
  Filled 2015-05-01 (×2): qty 1

## 2015-05-01 MED ORDER — SODIUM CHLORIDE 0.9 % IV SOLN: 250.0000 mL | INTRAVENOUS | Status: DC | PRN

## 2015-05-01 MED ORDER — FOLIC ACID 1 MG PO TABS: 3.0000 mg | ORAL_TABLET | Freq: Every day | ORAL | Status: DC

## 2015-05-01 MED ORDER — ACETAMINOPHEN 650 MG RE SUPP: 650.0000 mg | Freq: Four times a day (QID) | RECTAL | Status: DC | PRN

## 2015-05-01 MED ORDER — SIMVASTATIN 20 MG PO TABS
20.0000 mg | ORAL_TABLET | Freq: Every evening | ORAL | Status: DC
  Administered 2015-05-01 – 2015-05-10 (×10): 20 mg via ORAL
  Filled 2015-05-01 (×9): qty 1

## 2015-05-01 MED ORDER — ONDANSETRON HCL 4 MG/2ML IJ SOLN: 4.0000 mg | Freq: Four times a day (QID) | INTRAMUSCULAR | Status: DC | PRN

## 2015-05-01 MED ORDER — ACETAMINOPHEN 325 MG PO TABS: 650.0000 mg | ORAL_TABLET | Freq: Four times a day (QID) | ORAL | Status: DC | PRN

## 2015-05-01 MED ORDER — ACETAMINOPHEN 325 MG PO TABS
650.0000 mg | ORAL_TABLET | Freq: Four times a day (QID) | ORAL | Status: DC | PRN
  Administered 2015-05-01 – 2015-05-07 (×2): 650 mg via ORAL
  Filled 2015-05-01 (×3): qty 2

## 2015-05-01 MED ORDER — PANTOPRAZOLE SODIUM 40 MG PO TBEC: 40.0000 mg | DELAYED_RELEASE_TABLET | Freq: Every day | ORAL | Status: DC

## 2015-05-01 MED ORDER — ACETAMINOPHEN 325 MG PO TABS: 650.0000 mg | ORAL_TABLET | ORAL | Status: DC | PRN

## 2015-05-01 MED ORDER — SODIUM CHLORIDE 0.9% FLUSH: 3.0000 mL | INTRAVENOUS | Status: DC | PRN

## 2015-05-01 MED ORDER — PANTOPRAZOLE SODIUM 40 MG PO TBEC
40.0000 mg | DELAYED_RELEASE_TABLET | Freq: Every day | ORAL | Status: DC
  Administered 2015-05-02 – 2015-05-11 (×10): 40 mg via ORAL
  Filled 2015-05-01 (×10): qty 1

## 2015-05-01 MED ORDER — FUROSEMIDE 10 MG/ML IJ SOLN
40.0000 mg | INTRAMUSCULAR | Status: AC
  Administered 2015-05-01: 40 mg via INTRAVENOUS
  Filled 2015-05-01: qty 4

## 2015-05-01 MED ORDER — AMLODIPINE BESYLATE 2.5 MG PO TABS
2.5000 mg | ORAL_TABLET | Freq: Every day | ORAL | Status: DC
  Administered 2015-05-02 – 2015-05-11 (×9): 2.5 mg via ORAL
  Filled 2015-05-01 (×9): qty 1

## 2015-05-01 MED ORDER — INSULIN ASPART 100 UNIT/ML ~~LOC~~ SOLN: 0.0000 [IU] | Freq: Every day | SUBCUTANEOUS | Status: DC

## 2015-05-01 MED ORDER — ASPIRIN EC 81 MG PO TBEC: 81.0000 mg | DELAYED_RELEASE_TABLET | Freq: Every day | ORAL | Status: DC

## 2015-05-01 MED ORDER — LEVOTHYROXINE SODIUM 50 MCG PO TABS
50.0000 ug | ORAL_TABLET | Freq: Every day | ORAL | Status: DC
  Administered 2015-05-02 – 2015-05-11 (×10): 50 ug via ORAL
  Filled 2015-05-01 (×10): qty 1

## 2015-05-01 MED ORDER — AMLODIPINE BESYLATE 2.5 MG PO TABS: 2.5000 mg | ORAL_TABLET | Freq: Every day | ORAL | Status: DC

## 2015-05-01 MED ORDER — FOLIC ACID 1 MG PO TABS
3.0000 mg | ORAL_TABLET | Freq: Every day | ORAL | Status: DC
  Administered 2015-05-02 – 2015-05-11 (×10): 3 mg via ORAL
  Filled 2015-05-01 (×9): qty 3

## 2015-05-01 NOTE — H&P (Signed)
[facility] History and Physical  Hunter Choi DUK:[id number] DOB: Dec 05, 1927 DOA: 05/01/2015  Referring physician: sanders PCP: Tommy Rainwater, MD   Chief Complaint: dyspnea/chest pain  HPI: Hunter Choi is a very pleasant 82 y.o. male with a past medical history that includes CAD, hypertension, hyperlipidemia, chronic hyponatremia chronic thrombocytopenia chronic combined heart failure, bioprosthetic aortic valve presents to the emergency department with a chief complaint of dyspnea and chest pain. Initial evaluation in the emergency department reveals acute on chronic combined heart failure, mildly elevated troponin mild hyponatremia.  Information is obtained from the patient and his wife and son who are at the bedside as patient is very hard of hearing. They reported a 3 day history of gradually worsening shortness of breath. Associated symptoms include intermittent left anterior chest pain. He's describes the pain is sharp intermittent not better or worse with movement or breathing. He denies headache dizziness syncope or near-syncope. He denies any cough lower extremity edema or orthopnea. He denies palpitations diaphoresis nausea vomiting abdominal pain. He denies dysuria hematuria frequency or urgency. Family does indicate that he has not been taking his Lasix that was prescribed at last hospitalization as they did not understand it was a daily medication.  In the emergency department he is afebrile hemodynamically stable and not hypoxic. He is given 40 mg of Lasix intravenously  Review of Systems:  10 point review of systems complete and all systems are negative except as indicated in the history of present illness.  Past Medical History  Diagnosis Date  . Hypertension   . GERD (gastroesophageal reflux disease)   . Aortic stenosis, severe   . Embolism involving retinal artery   . BPH (benign prostatic hypertrophy)     Dr. Retta Diones  . Iliac artery aneurysm  (HCC)     Dr. Edilia Bo  . AAA (abdominal aortic aneurysm) (HCC)     Dr. Ashley Royalty  . Diastolic heart failure (HCC)   . Coronary artery disease     Left main disease and severe 3-vessel CAD  . CAD (coronary artery disease) of bypass graft     Chronic occlusion of saphenous vein graft to RCA  . Hypothyroidism   . Heart murmur   . Stroke [facility])     mini stroke effective his eye left  . Arthritis   . 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 Atrial fibrillation (HCC)   . Anxiety   . Blind left eye    Past Surgical History  Procedure Laterality Date  . Hemiarthroplasty hip Left     AFTER HIP FX  . Total knee arthroplasty  1992-2002    right-left  . Biopsy prostate  2005  . Transurethral resection of prostate  2012  . Joint replacement    . Harvest bone graft  1982    FOR THE  ANKLE  . Skin grafts  1968  . Colonoscopy    . Bunionectomy with hammertoe reconstruction Bilateral   . Transcatheter aortic valve replacement, transapical N/A 01/15/2013    Procedure: TRANSCATHETER AORTIC VALVE REPLACEMENT, TRANSAPICAL;  Surgeon: [name], MD;  Location: [facility] OR;  Service: Open Heart Surgery;  Laterality: N/A;  . Intraoperative transesophageal echocardiogram N/A 01/15/2013    Procedure: INTRAOPERATIVE TRANSESOPHAGEAL ECHOCARDIOGRAM;  Surgeon: [name], MD;  Location: [facility] OR;  Service: Open Heart Surgery;  Laterality: N/A;  . Left and right heart catheterization with coronary  angiogram N/A 12/20/2012    Procedure: LEFT AND RIGHT HEART CATHETERIZATION WITH CORONARY ANGIOGRAM;  Surgeon: Lesleigh Noe, MD;  Location: [facility] CATH LAB;  Service: Cardiovascular;  Laterality: N/A;  . Carotid endarterectomy Right Jan. 2, 1997  . Cardiac catheterization    . Total hip arthroplasty Left 2007  . Cataract extraction w/ intraocular lens implant Right   . Coronary artery bypass graft   01/1998    Dr. Sheliah Plane; LIMA to LAD, SVG to D1, SVG to LCx, SVG to RCA, open saphenous vein harvest via right lower extremity  . Cholecystectomy N/A 01/28/2015    Procedure: LAPAROSCOPIC CHOLECYSTECTOMY WITH INTRAOPERATIVE CHOLANGIOGRAM;  Surgeon: Gaynelle Adu, MD;  Location: [facility] OR;  Service: General;  Laterality: N/A;   Social History:  reports that he quit smoking about 57 years ago. His smoking use included Cigarettes. He has a 23.5 pack-year smoking history. He has quit using smokeless tobacco. His smokeless tobacco use included Chew. He reports that he does not drink alcohol or use illicit drugs. He lives at home with his wife and his son. He uses a walker related to rheumatoid arthritis Allergies  Allergen Reactions  . Ciprofloxacin Other (See Comments)    REACTION: mild delirium    Family History  Problem Relation Age of Onset  . Heart attack Mother   . Heart disease Mother     Before age 55  . Hyperlipidemia Mother   . Hypertension Mother   . Hypertension Son   . Heart disease Father     AAA-   . Hyperlipidemia Father   . Heart attack Father   . Hypertension Father   . Heart disease Brother     Before age 88  . Hyperlipidemia Brother   . Heart attack Brother   . Hypertension Brother      Prior to Admission medications   Medication Sig Start Date End Date Taking? Authorizing Provider  acetaminophen (TYLENOL) 325 MG tablet Take 2 tablets (650 mg total) by mouth every 6 (six) hours as needed for mild pain, moderate pain, fever or headache. 02/03/15  Yes Elease Etienne, MD  amLODipine (NORVASC) 5 MG tablet Take 0.5 tablets (2.5 mg total) by mouth daily at 3 pm. 02/03/15  Yes Elease Etienne, MD  aspirin EC 81 MG EC tablet Take 1 tablet (81 mg total) by mouth daily. 01/21/13  Yes Donielle Margaretann Loveless, PA-C  folic acid (FOLVITE) 1 MG tablet Take 3 mg by mouth daily.   Yes Historical Provider, MD  furosemide (LASIX) 20 MG tablet Take 1 tablet (20 mg total) by mouth  daily. 02/03/15  Yes Elease Etienne, MD  levothyroxine (SYNTHROID, LEVOTHROID) 50 MCG tablet Take 50 mcg by mouth daily. 11/06/14  Yes Historical Provider, MD  lisinopril (PRINIVIL,ZESTRIL) 2.5 MG tablet Take 1 tablet (2.5 mg total) by mouth daily. 02/03/15  Yes Elease Etienne, MD  methotrexate 2.5 MG tablet Take 25 mg by mouth every Friday.    Yes Historical Provider, MD  metoprolol tartrate (LOPRESSOR) 25 MG tablet Take 1 tablet (25 mg total) by mouth 2 (two) times daily. 02/03/15  Yes Elease Etienne, MD  omeprazole (PRILOSEC) 20 MG capsule Take 20 mg by mouth daily.   Yes Historical Provider, MD  polyethylene glycol (MIRALAX / GLYCOLAX) packet Take 17 g by mouth 2 (two) times daily. 02/03/15  Yes Elease Etienne, MD  predniSONE (DELTASONE) 5 MG tablet Take 5 mg by mouth daily. 04/07/15  Yes Historical Provider, MD  simvastatin (ZOCOR) 20 MG tablet Take 20 mg by mouth every evening. 12/20/14  Yes Historical Provider, MD   Physical Exam: Filed Vitals:   05/01/15 1343 05/01/15 1515 05/01/15 1530 05/01/15 1545  BP: 181/98 167/82 171/87 160/85  Pulse: 108 98 98 92  Temp: 97.8 F (36.6 C)     TempSrc: Oral     Resp: 18 23 18 18   SpO2: 94% 100% 98% 95%    Wt Readings from Last 3 Encounters:  02/03/15 60.2 kg (132 lb 11.5 oz)  01/14/15 68.493 kg (151 lb)  11/18/14 71.4 kg (157 lb 6.5 oz)    General:  Appears calm and comfortable slightly pale somewhat frail Eyes: PERRL, normal lids, irises & conjunctiva ENT: grossly normal hearing, lips & tongue, very hard of hearing hearing aids in place, poor dentition Neck: no LAD, masses or thyromegaly Cardiovascular: RRR, +murmur. No LE edema.   Respiratory:  Normal respiratory effort. Fine crackles bilateral bases Abdomen: soft, ntnd  Skin: no rash or induration seen on limited exam Musculoskeletal: Poor muscle tone. Arthritic changes Psychiatric: grossly normal mood and affect, speech fluent and appropriate Neurologic: grossly non-focal.  Speech clear facial symmetry           Labs on Admission:  Basic Metabolic Panel:  Recent Labs Lab 05/01/15 1343  NA 132*  K 4.9  CL 96*  CO2 22  GLUCOSE 161*  BUN 17  CREATININE 1.03  CALCIUM 9.0   Liver Function Tests: No results for input(s): AST, ALT, ALKPHOS, BILITOT, PROT, ALBUMIN in the last 168 hours. No results for input(s): LIPASE, AMYLASE in the last 168 hours. No results for input(s): AMMONIA in the last 168 hours. CBC:  Recent Labs Lab 05/01/15 1343  WBC 9.4  HGB 12.5*  HCT 39.2  MCV 93.6  PLT 172   Cardiac Enzymes: No results for input(s): CKTOTAL, CKMB, CKMBINDEX, TROPONINI in the last 168 hours.  BNP (last 3 results)  Recent Labs  05/01/15 1343  BNP >4500.0*    ProBNP (last 3 results) No results for input(s): PROBNP in the last 8760 hours.  CBG: No results for input(s): GLUCAP in the last 168 hours.  Radiological Exams on Admission: Dg Chest 2 View  05/01/2015  CLINICAL DATA:  Chest pain and weakness for 2 days EXAM: CHEST  2 VIEW COMPARISON:  11/14/2014 FINDINGS: Previous median sternotomy and CABG procedure. Aortic atherosclerosis noted. There is mild cardiac enlargement. Bilateral pleural effusions are present in areas mild to moderate interstitial edema consistent with CHF. Atelectasis is noted in the lung bases. IMPRESSION: 1. Congestive heart failure. 2. Decreased aeration to the lung bases. Electronically Signed   By: 11/16/2014 M.D.   On: 05/01/2015 14:05    EKG: Independently reviewed. Undetermined rhythm Incomplete left bundle branch block ST & T wave abnormality, consider inferolateral ischemia  Assessment/Plan Principal Problem:   Acute on chronic heart failure (HCC) Active Problems:   Rheumatoid arthritis (HCC)   GERD (gastroesophageal reflux disease)   Thyroid disease   BPH (benign prostatic hypertrophy)   AAA (abdominal aortic aneurysm) without rupture (HCC)   Arterial hypotension   Hyponatremia   Chest pain at  rest  #1. Acute on chronic combined heart failure. Echo done in October 2016 mild LVH. Systolic function was mildly to moderately reduced. The estimated ejection fraction was in the range of 40% to 45%. Diffuse hypokinesis. Doppler parameters areconsistent with abnormal left ventricular relaxation (grade 1diastolic dysfunction). Likely related to the fact he's not been taking his  Lasix due to misunderstanding at discharge -Admit to telemetry -Continue IV Lasix -Daily weights -Intake and output -Monitor electrolytes -Continue ACE inhibitor and beta blocker  #2. Chest pain. Some typical and atypical features. Heart score 4. Pain-free admission. Initial troponin 0.05. EKG without acute changes. -Monitor on telemetry -Cycle troponins -Serial EKG -Supportive therapy  #3. Abdominal aortic aneurysm without rupture. He is surveilled every 6 months. Wife indicates he has an appointment Monday for checkup -Wife to notify Dr. Durwin Nora  #4. Hyponatremia. Patient with a history of same. Sodium level 132 on admission. Chart review indicates this is the high end of his range -Monitor given #1  #5. Rheumatoid arthritis. Stable at baseline -Continue home meds -Physical therapy  #6. Hyperglycemia. -We'll obtain a hemoglobin A1c -Sliding scale insulin    Code Status: full DVT Prophylaxis: Family Communication: wife and son at bedside Disposition Plan: home when ready  Time spent: 47 minutes  [facility] M [facility]

## 2015-05-01 NOTE — ED Notes (Signed)
Pt here for a few days of chest pain, SOB and feeling shaky.

## 2015-05-01 NOTE — ED Provider Notes (Signed)
CSN: [id number]     Arrival date & time 05/01/15  1324 History   First MD Initiated Contact with Patient 05/01/15 1518     Chief Complaint  Patient presents with  . Shortness of Breath  . Chest Pain     (Consider location/radiation/quality/duration/timing/severity/associated sxs/prior Treatment) Patient is a 82 y.o. male presenting with shortness of breath and chest pain. The history is provided by medical records and the patient.  Shortness of Breath Associated symptoms: chest pain   Chest Pain Associated symptoms: shortness of breath    82 y.o. M with hx of HTN, GERD, severe AS s/p valve replacement, hx AAA, CHF with most recent EF of 40-45%, CAD, hx stroke, AFIB, anxiety, presenting to the ED for chest pain and SOB.  Patient states yesterday his symptoms were mild, however got worse this morning upon waking.  He states he has been sitting around most of the day today because he feels tired.  He states later this afternoon he developed some mild discomfort in the left side of his chest so he decided to come to the ED.  Patient denies current chest pain, just ongoing SOB.  Patient states he is overall been doing fairly well from a cardiac standpoint. Last saw his cardiologist, Dr. Katrinka Blazing, about a year ago.  He is no longer on anti-coagulation s/p valve replacement, only daily ASA which he has taken today.  He does have hx of CHF, however is not currently on any diuretics.  He was briefly a few months ago, however discontinued them shortly after.  No cough, fever, or other upper respiratory symptoms.  VSS.  Past Medical History  Diagnosis Date  . Hypertension   . GERD (gastroesophageal reflux disease)   . Aortic stenosis, severe   . Embolism involving retinal artery   . BPH (benign prostatic hypertrophy)     Dr. Retta Diones  . Iliac artery aneurysm (HCC)     Dr. Edilia Bo  . AAA (abdominal aortic aneurysm) (HCC)     Dr. Ashley Royalty  . Diastolic heart failure (HCC)   . Coronary artery disease      Left main disease and severe 3-vessel CAD  . CAD (coronary artery disease) of bypass graft     Chronic occlusion of saphenous vein graft to RCA  . Hypothyroidism   . Heart murmur   . Stroke [facility])     mini stroke effective his eye left  . Arthritis   . 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 Atrial fibrillation (HCC)   . Anxiety   . Blind left eye    Past Surgical History  Procedure Laterality Date  . Hemiarthroplasty hip Left     AFTER HIP FX  . Total knee arthroplasty  1992-2002    right-left  . Biopsy prostate  2005  . Transurethral resection of prostate  2012  . Joint replacement    . Harvest bone graft  1982    FOR THE  ANKLE  . Skin grafts  1968  . Colonoscopy    . Bunionectomy with hammertoe reconstruction Bilateral   . Transcatheter aortic valve replacement, transapical N/A 01/15/2013    Procedure: TRANSCATHETER AORTIC VALVE REPLACEMENT, TRANSAPICAL;  Surgeon: [name], MD;  Location: [facility] OR;  Service: Open Heart Surgery;  Laterality: N/A;  . Intraoperative transesophageal echocardiogram N/A 01/15/2013    Procedure: INTRAOPERATIVE TRANSESOPHAGEAL ECHOCARDIOGRAM;  Surgeon: [name], MD;  Location: [facility] OR;  Service: Open Heart Surgery;  Laterality: N/A;  . Left and right heart catheterization with coronary angiogram N/A 12/20/2012    Procedure: LEFT AND RIGHT HEART CATHETERIZATION WITH CORONARY ANGIOGRAM;  Surgeon: Lesleigh Noe, MD;  Location: [facility] CATH LAB;  Service: Cardiovascular;  Laterality: N/A;  . Carotid endarterectomy Right Jan. 2, 1997  . Cardiac catheterization    . Total hip arthroplasty Left 2007  . Cataract extraction w/ intraocular lens implant Right   . Coronary artery bypass graft  01/1998    Dr. Sheliah Plane; LIMA to LAD, SVG to D1, SVG to LCx, SVG to RCA, open saphenous vein harvest via right lower extremity  . Cholecystectomy  N/A 01/28/2015    Procedure: LAPAROSCOPIC CHOLECYSTECTOMY WITH INTRAOPERATIVE CHOLANGIOGRAM;  Surgeon: Gaynelle Adu, MD;  Location: [facility] OR;  Service: General;  Laterality: N/A;   Family History  Problem Relation Age of Onset  . Heart attack Mother   . Heart disease Mother     Before age 96  . Hyperlipidemia Mother   . Hypertension Mother   . Hypertension Son   . Heart disease Father     AAA-   . Hyperlipidemia Father   . Heart attack Father   . Hypertension Father   . Heart disease Brother     Before age 86  . Hyperlipidemia Brother   . Heart attack Brother   . Hypertension Brother    Social History  Substance Use Topics  . Smoking status: Former Smoker -- 0.50 packs/day for 47 years    Types: Cigarettes    Quit date: 03/28/1958  . Smokeless tobacco: Former Neurosurgeon    Types: Chew     Comment: ONLY USED 2 YEARS  . Alcohol Use: No    Review of Systems  Respiratory: Positive for shortness of breath.   Cardiovascular: Positive for chest pain.  All other systems reviewed and are negative.     Allergies  Ciprofloxacin  Home Medications   Prior to Admission medications   Medication Sig Start Date End Date Taking? Authorizing Provider  acetaminophen (TYLENOL) 325 MG tablet Take 2 tablets (650 mg total) by mouth every 6 (six) hours as needed for mild pain, moderate pain, fever or headache. 02/03/15   Elease Etienne, MD  amLODipine (NORVASC) 5 MG tablet Take 0.5 tablets (2.5 mg total) by mouth daily at 3 pm. 02/03/15   Elease Etienne, MD  aspirin EC 81 MG EC tablet Take 1 tablet (81 mg total) by mouth daily. 01/21/13   Donielle Margaretann Loveless, PA-C  folic acid (FOLVITE) 1 MG tablet Take 3 mg by mouth daily.    Historical Provider, MD  furosemide (LASIX) 20 MG tablet Take 1 tablet (20 mg total) by mouth daily. 02/03/15   Elease Etienne, MD  levothyroxine (SYNTHROID, LEVOTHROID) 50 MCG tablet Take 50 mcg by mouth daily. 11/06/14   Historical Provider, MD  lisinopril  (PRINIVIL,ZESTRIL) 2.5 MG tablet Take 1 tablet (2.5 mg total) by mouth daily. 02/03/15   Elease Etienne, MD  methotrexate 2.5 MG tablet Take 25 mg by mouth once a week. Every Thursday    Historical Provider, MD  metoprolol tartrate (LOPRESSOR) 25 MG tablet Take 1 tablet (25 mg total) by mouth 2 (two) times daily. 02/03/15   Elease Etienne, MD  omeprazole (PRILOSEC) 20 MG capsule Take 20 mg by mouth daily.    Historical Provider, MD  polyethylene glycol (MIRALAX / GLYCOLAX) packet Take 17 g by mouth  2 (two) times daily. 02/03/15   Elease Etienne, MD  simvastatin (ZOCOR) 20 MG tablet Take 20 mg by mouth every evening. 12/20/14   Historical Provider, MD   BP 167/82 mmHg  Pulse 98  Temp(Src) 97.8 F (36.6 C) (Oral)  Resp 23  SpO2 100%   Physical Exam  Constitutional: He is oriented to person, place, and time. He appears well-developed and well-nourished. No distress.  Elderly, very hard of hearing  HENT:  Head: Normocephalic and atraumatic.  Mouth/Throat: Uvula is midline, oropharynx is clear and moist and mucous membranes are normal. Abnormal dentition.  Hearing aids present  Eyes: Conjunctivae and EOM are normal. Pupils are equal, round, and reactive to light.  Neck: Normal range of motion. Neck supple.  Cardiovascular: Normal rate, regular rhythm and normal heart sounds.   Pulmonary/Chest: Effort normal. No respiratory distress. He has no wheezes. He has rales.  Minimal crackles at bases  Musculoskeletal: Normal range of motion. He exhibits no edema.  No pitting edema noted  Neurological: He is alert and oriented to person, place, and time.  Skin: Skin is warm and dry. He is not diaphoretic.  Psychiatric: He has a normal mood and affect.  Nursing note and vitals reviewed.   ED Course  Procedures (including critical care time) Labs Review Labs Reviewed  BASIC METABOLIC PANEL - Abnormal; Notable for the following:    Sodium 132 (*)    Chloride 96 (*)    Glucose, Bld 161 (*)     All other components within normal limits  CBC - Abnormal; Notable for the following:    RBC 4.19 (*)    Hemoglobin 12.5 (*)    RDW 17.0 (*)    All other components within normal limits  BRAIN NATRIURETIC PEPTIDE - Abnormal; Notable for the following:    B Natriuretic Peptide >4500.0 (*)    All other components within normal limits  I-STAT TROPOININ, ED    Imaging Review Dg Chest 2 View  05/01/2015  CLINICAL DATA:  Chest pain and weakness for 2 days EXAM: CHEST  2 VIEW COMPARISON:  11/14/2014 FINDINGS: Previous median sternotomy and CABG procedure. Aortic atherosclerosis noted. There is mild cardiac enlargement. Bilateral pleural effusions are present in areas mild to moderate interstitial edema consistent with CHF. Atelectasis is noted in the lung bases. IMPRESSION: 1. Congestive heart failure. 2. Decreased aeration to the lung bases. Electronically Signed   By: Signa Kell M.D.   On: 05/01/2015 14:05   I have personally reviewed and evaluated these images and lab results as part of my medical decision-making.   EKG Interpretation None      MDM   Final diagnoses:  Acute on chronic diastolic congestive heart failure (HCC)  Shortness of breath  Hard of hearing, bilateral   82 year old male here with chest pain and shortness of breath, worsened today upon waking. Patient is afebrile, nontoxic. His workup today with elevated BNP and pulmonary edema on CXR is consistent with acute CHF exacerbation. His troponin is negative.  VS remain stable on RA.  No current chest pain.  Patient does have history of diastolic heart failure but is not currently on any diuretics. His renal function is within normal limits. Will start on IV Lasix and admit for continued diuresis-- Dr. Catha Gosselin, inpatient telemetry.  Temp admit orders placed.  VS remain stable at this time.  Garlon Hatchet, PA-C 05/01/15 1720  Arby Barrette, MD 05/03/15 [phone]

## 2015-05-01 NOTE — Progress Notes (Signed)
At 1834 pt arrived to the unit via stretcher .  Oriented to room.  Call bell at reach.  Instructed to call for assistance.  Verbalized understanding.

## 2015-05-02 LAB — BASIC METABOLIC PANEL
ANION GAP: 13 (ref 5–15)
BUN: 16 mg/dL (ref 6–20)
CALCIUM: 9.2 mg/dL (ref 8.9–10.3)
CO2: 28 mmol/L (ref 22–32)
Chloride: 89 mmol/L — ABNORMAL LOW (ref 101–111)
Creatinine, Ser: 1.06 mg/dL (ref 0.61–1.24)
Glucose, Bld: 89 mg/dL (ref 65–99)
POTASSIUM: 4.5 mmol/L (ref 3.5–5.1)
SODIUM: 130 mmol/L — AB (ref 135–145)

## 2015-05-02 LAB — GLUCOSE, CAPILLARY
GLUCOSE-CAPILLARY: 94 mg/dL (ref 65–99)
GLUCOSE-CAPILLARY: 106 mg/dL — AB (ref 65–99)

## 2015-05-02 LAB — TROPONIN I: TROPONIN I: 0.74 ng/mL — AB (ref <0.031)

## 2015-05-02 LAB — HEMOGLOBIN A1C
HEMOGLOBIN A1C: 6 % — AB (ref 4.8–5.6)
MEAN PLASMA GLUCOSE: 126 mg/dL

## 2015-05-02 NOTE — Progress Notes (Signed)
[facility]                                                                              Patient Demographics  Hunter Choi, is a 82 y.o. male, DOB - 01/11/1928, XFG:[id number]  Admit date - 05/01/2015   Admitting Physician Edsel Petrin, DO  Outpatient Primary MD for the patient is Shamleffer, Landry Mellow, MD  LOS - 1   Chief Complaint  Patient presents with  . Shortness of Breath  . Chest Pain       Brief HPI   Hunter Choi is a very pleasant 82 y.o. male with a past medical history that includes CAD, hypertension, hyperlipidemia, chronic hyponatremia chronic thrombocytopenia chronic combined heart failure, bioprosthetic aortic valve presented to ED with dyspnea and chest pain. Patient's family reported reported a 3 day history of gradually worsening shortness of breath. Associated symptoms include intermittent left anterior chest pain. He denied any cough lower extremity edema or orthopnea. He denies palpitations diaphoresis nausea vomiting abdominal pain. He denies dysuria hematuria frequency or urgency. Family does indicate that he has not been taking his Lasix that was prescribed at last hospitalization as they did not understand it was a daily medication. In the emergency department he is afebrile hemodynamically stable and not hypoxic. He is given 40 mg of Lasix intravenously   Assessment & Plan    Principal Problem: Acute on chronic combined heart failure. Echo done in October 2016 mild LVH. EF 40-45% was diffuse hypokinesis, grade 1 diastolic dysfunction.  Likely related to the fact he's not been taking his Lasix due to misunderstanding at discharge BNP over 4500 -Continue IV Lasix - Negative balance of 1.3 L, follow daily weights -Continue ACE inhibitor and beta blocker   Chest pain. Some typical and atypical features. Heart score 4. Pain-free admission. Initial troponin 0.05. EKG without acute changes. No chest pain at this time, troponin  trending up likely due to acute on chronic CHF exacerbation and troponin leak - Obtain 2-D echocardiogram, - If worsening EF or wall motion abnormalities, will consult cardiology   Abdominal aortic aneurysm without rupture. He is surveilled every 6 months. Wife indicates he has an appointment Monday for checkup. Also has history of aortic valve disease, status post TAVR in 10/14, follows Dr. Verdis Prime -Wife to notify Dr. Edilia Bo  . Hyponatremia. Chronic hyponatremia - Sodium trending down to 130, follow closely with IV diuresis    Rheumatoid arthritis. Stable at baseline -Continue home meds -Physical therapy  Hyperglycemia. -hemoglobin A1c 6.0  -Sliding scale insulin  Code Status: Full code  Family Communication: Discussed in detail with the patient, all imaging results, lab results explained to the patient    Disposition Plan:   Time Spent in minutes 25 minutes  Procedures  none  Consults   none  DVT Prophylaxis  Lovenox   Medications  Scheduled Meds: . amLODipine  2.5 mg Oral Q1500  . aspirin EC  81 mg Oral Daily  . enoxaparin (LOVENOX) injection  30 mg Subcutaneous Q24H  . folic acid  3 mg Oral Daily  . furosemide  40 mg Intravenous BID  . insulin aspart  0-15 Units Subcutaneous TID WC  . insulin aspart  0-5 Units Subcutaneous QHS  . levothyroxine  50 mcg Oral QAC breakfast  . lisinopril  2.5 mg Oral Daily  . methotrexate  25 mg Oral Weekly  . metoprolol tartrate  25 mg Oral BID  . pantoprazole  40 mg Oral Daily  . polyethylene glycol  17 g Oral BID  . simvastatin  20 mg Oral QPM  . sodium chloride flush  3 mL Intravenous Q12H   Continuous Infusions:  PRN Meds:.sodium chloride, acetaminophen **OR** acetaminophen, ondansetron **OR** ondansetron (ZOFRAN) IV, sodium chloride flush   Antibiotics   Anti-infectives    None        Subjective:   Hunter Choi was seen and examined today.  Denies any chest pain, feeling better today. Patient denies  dizziness,  shortness of breath, abdominal pain, N/V/D/C, new weakness, numbess, tingling. No acute events overnight.    Objective:   Blood pressure 124/55, pulse 64, temperature 98.1 F (36.7 C), temperature source Oral, resp. rate 18, height 5\' 7"  (1.702 m), weight 63.504 kg (140 lb), SpO2 96 %.  Wt Readings from Last 3 Encounters:  05/02/15 63.504 kg (140 lb)  02/03/15 60.2 kg (132 lb 11.5 oz)  01/14/15 68.493 kg (151 lb)     Intake/Output Summary (Last 24 hours) at 05/02/15 1236 Last data filed at 05/02/15 1016  Gross per 24 hour  Intake    240 ml  Output   1600 ml  Net  -1360 ml    Exam  General: Alert and oriented x 3, NAD  HEENT:  PERRLA, EOMI, Anicteric Sclera, mucous membranes moist.   Neck: Supple, no JVD, no masses  CVS: S1 S2 auscultated, 1/6 systolic murmur   Respiratory: Mild bibasilar crackles   Abdomen: Soft, nontender, nondistended, + bowel sounds  Ext: no cyanosis clubbing or edema  Neuro:  no new deficits  skin: No rashes  Psych: Normal affect and demeanor, alert and oriented x3    Data Review   Micro Results No results found for this or any previous visit (from the past 240 hour(s)).  Radiology Reports Dg Chest 2 View  05/01/2015  CLINICAL DATA:  Chest pain and weakness for 2 days EXAM: CHEST  2 VIEW COMPARISON:  11/14/2014 FINDINGS: Previous median sternotomy and CABG procedure. Aortic atherosclerosis noted. There is mild cardiac enlargement. Bilateral pleural effusions are present in areas mild to moderate interstitial edema consistent with CHF. Atelectasis is noted in the lung bases. IMPRESSION: 1. Congestive heart failure. 2. Decreased aeration to the lung bases. Electronically Signed   By: 11/16/2014 M.D.   On: 05/01/2015 14:05    CBC  Recent Labs Lab 05/01/15 1343  WBC 9.4  HGB 12.5*  HCT 39.2  PLT 172  MCV 93.6  MCH 29.8  MCHC 31.9  RDW 17.0*    Chemistries   Recent Labs Lab 05/01/15 1343 05/02/15 0443  NA 132*  130*  K 4.9 4.5  CL 96* 89*  CO2 22 28  GLUCOSE 161* 89  BUN 17 16  CREATININE 1.03 1.06  CALCIUM 9.0 9.2   ------------------------------------------------------------------------------------------------------------------ estimated creatinine clearance is 45.8 mL/min (by C-G formula based on Cr of 1.06). ------------------------------------------------------------------------------------------------------------------  Recent Labs  05/01/15 1717  HGBA1C 6.0*   ------------------------------------------------------------------------------------------------------------------ No results for input(s): CHOL, HDL, LDLCALC, TRIG, CHOLHDL, LDLDIRECT in the last 72 hours. ------------------------------------------------------------------------------------------------------------------  Recent Labs  05/01/15 1717  TSH 3.335   ------------------------------------------------------------------------------------------------------------------ No results for input(s): VITAMINB12, FOLATE, FERRITIN, TIBC, IRON,  RETICCTPCT in the last 72 hours.  Coagulation profile No results for input(s): INR, PROTIME in the last 168 hours.  No results for input(s): DDIMER in the last 72 hours.  Cardiac Enzymes  Recent Labs Lab 05/01/15 2046 05/02/15 0443  TROPONINI 0.46* 0.74*   ------------------------------------------------------------------------------------------------------------------ Invalid input(s): POCBNP   Recent Labs  05/01/15 2145 05/02/15 0623  GLUCAP 107* 94     Alisan Dokes M.D. [facility] 05/02/2015, 12:36 PM  Pager: [phone] Between 7am to 7pm - call Pager - [phone]  After 7pm go to [email or web address] - password [email or web address]  Call night coverage person covering after 7pm

## 2015-05-03 LAB — GLUCOSE, CAPILLARY
GLUCOSE-CAPILLARY: 93 mg/dL (ref 65–99)
GLUCOSE-CAPILLARY: 112 mg/dL — AB (ref 65–99)
GLUCOSE-CAPILLARY: 111 mg/dL — AB (ref 65–99)
Glucose-Capillary: 96 mg/dL (ref 65–99)

## 2015-05-03 LAB — BASIC METABOLIC PANEL
Anion gap: 12 (ref 5–15)
BUN: 17 mg/dL (ref 6–20)
CALCIUM: 8.2 mg/dL — AB (ref 8.9–10.3)
CO2: 26 mmol/L (ref 22–32)
CREATININE: 1.02 mg/dL (ref 0.61–1.24)
Chloride: 88 mmol/L — ABNORMAL LOW (ref 101–111)
GFR calc non Af Amer: >60 mL/min (ref >60)
Glucose, Bld: 90 mg/dL (ref 65–99)
Potassium: 4.1 mmol/L (ref 3.5–5.1)
SODIUM: 126 mmol/L — AB (ref 135–145)

## 2015-05-03 LAB — OSMOLALITY, URINE: Osmolality, Ur: 276 mOsm/kg — ABNORMAL LOW (ref 300–900)

## 2015-05-03 LAB — OSMOLALITY: OSMOLALITY: 282 mosm/kg (ref 275–295)

## 2015-05-03 LAB — SODIUM, URINE, RANDOM: Sodium, Ur: 91 mmol/L

## 2015-05-03 MED ORDER — FUROSEMIDE 10 MG/ML IJ SOLN
60.0000 mg | Freq: Two times a day (BID) | INTRAMUSCULAR | Status: DC
  Administered 2015-05-03 – 2015-05-04 (×3): 60 mg via INTRAVENOUS
  Filled 2015-05-03 (×3): qty 6

## 2015-05-03 NOTE — Progress Notes (Signed)
[facility]                                                                              Patient Demographics  Hunter Choi, is a 82 y.o. male, DOB - 07/22/1927, LYY:[id number]  Admit date - 05/01/2015   Admitting Physician Edsel Petrin, DO  Outpatient Primary MD for the patient is Shamleffer, Landry Mellow, MD  LOS - 2   Chief Complaint  Patient presents with  . Shortness of Breath  . Chest Pain       Brief HPI   DERMOT GREMILLION is a very pleasant 82 y.o. male with a past medical history that includes CAD, hypertension, hyperlipidemia, chronic hyponatremia chronic thrombocytopenia chronic combined heart failure, bioprosthetic aortic valve presented to ED with dyspnea and chest pain. Patient's family reported reported a 3 day history of gradually worsening shortness of breath. Associated symptoms include intermittent left anterior chest pain. He denied any cough lower extremity edema or orthopnea. He denies palpitations diaphoresis nausea vomiting abdominal pain. He denies dysuria hematuria frequency or urgency. Family does indicate that he has not been taking his Lasix that was prescribed at last hospitalization as they did not understand it was a daily medication. In the emergency department he is afebrile hemodynamically stable and not hypoxic. He is given 40 mg of Lasix intravenously   Assessment & Plan    Principal Problem: Acute on chronic combined heart failure. Echo done in October 2016 mild LVH. EF 40-45% was diffuse hypokinesis, grade 1 diastolic dysfunction.  Likely related to the fact he's not been taking his Lasix due to misunderstanding at discharge BNP over 4500 -Continue IV Lasix, increase to 60 mg IV BID - Not much diuresis as expected or the weight change, hyponatremia, if no significant improvement for next 24 hours, will consult with cardiology. -Continue ACE inhibitor and beta blocker   Chest pain. Some typical and atypical features. Heart  score 4. Pain-free admission. Initial troponin 0.05. EKG without acute changes. No chest pain at this time, troponin trending up likely due to acute on chronic CHF exacerbation and troponin leak - 2-D echo pending   Abdominal aortic aneurysm without rupture. He is surveilled every 6 months. Wife indicates he has an appointment Monday for checkup. Also has history of aortic valve disease, status post TAVR in 10/14, follows Dr. Verdis Prime -Wife to notify Dr. Edilia Bo  Hyponatremia. Chronic hyponatremia - Sodium trending down, on IV diuresis - Obtain serum osm, urine osm, UNa   Rheumatoid arthritis. Stable at baseline -Continue home meds -Physical therapy  Hyperglycemia. -hemoglobin A1c 6.0  -Sliding scale insulin  Code Status: Full code  Family Communication: Discussed in detail with the patient, all imaging results, lab results explained to the patient    Disposition Plan:   Time Spent in minutes 25 minutes  Procedures  none  Consults   none  DVT Prophylaxis  Lovenox   Medications  Scheduled Meds: . amLODipine  2.5 mg Oral Q1500  . aspirin EC  81 mg Oral Daily  . enoxaparin (LOVENOX) injection  30 mg Subcutaneous Q24H  . folic acid  3 mg Oral Daily  .  furosemide  40 mg Intravenous BID  . insulin aspart  0-15 Units Subcutaneous TID WC  . insulin aspart  0-5 Units Subcutaneous QHS  . levothyroxine  50 mcg Oral QAC breakfast  . lisinopril  2.5 mg Oral Daily  . methotrexate  25 mg Oral Weekly  . metoprolol tartrate  25 mg Oral BID  . pantoprazole  40 mg Oral Daily  . polyethylene glycol  17 g Oral BID  . simvastatin  20 mg Oral QPM  . sodium chloride flush  3 mL Intravenous Q12H   Continuous Infusions:  PRN Meds:.sodium chloride, acetaminophen **OR** acetaminophen, ondansetron **OR** ondansetron (ZOFRAN) IV, sodium chloride flush   Antibiotics   Anti-infectives    None        Subjective:   Niki Gameros was seen and examined today. No chest pain, no  significant complaints. Overall feeling better . Patient denies dizziness,  shortness of breath, abdominal pain, N/V/D/C, new weakness, numbess, tingling. No acute events overnight.    Objective:   Blood pressure 130/57, pulse 66, temperature 97.9 F (36.6 C), temperature source Oral, resp. rate 20, height 5\' 7"  (1.702 m), weight 62.869 kg (138 lb 9.6 oz), SpO2 97 %.  Wt Readings from Last 3 Encounters:  05/03/15 62.869 kg (138 lb 9.6 oz)  02/03/15 60.2 kg (132 lb 11.5 oz)  01/14/15 68.493 kg (151 lb)     Intake/Output Summary (Last 24 hours) at 05/03/15 1226 Last data filed at 05/03/15 0850  Gross per 24 hour  Intake    960 ml  Output    751 ml  Net    209 ml    Exam  General: Alert and oriented x 3, NAD  HEENT:  PERRLA, EOMI   Neck: Supple, no JVD, no masses  CVS: S1 S2 auscultated, 1/6 systolic murmur   Respiratory: Mild bibasilar crackles   Abdomen: Soft, nontender, nondistended, + bowel sounds  Ext: no cyanosis clubbing or edema  Neuro:  no new deficits  skin: No rashes  Psych: Normal affect and demeanor, alert and oriented x3    Data Review   Micro Results No results found for this or any previous visit (from the past 240 hour(s)).  Radiology Reports Dg Chest 2 View  05/01/2015  CLINICAL DATA:  Chest pain and weakness for 2 days EXAM: CHEST  2 VIEW COMPARISON:  11/14/2014 FINDINGS: Previous median sternotomy and CABG procedure. Aortic atherosclerosis noted. There is mild cardiac enlargement. Bilateral pleural effusions are present in areas mild to moderate interstitial edema consistent with CHF. Atelectasis is noted in the lung bases. IMPRESSION: 1. Congestive heart failure. 2. Decreased aeration to the lung bases. Electronically Signed   By: Signa Kell M.D.   On: 05/01/2015 14:05    CBC  Recent Labs Lab 05/01/15 1343  WBC 9.4  HGB 12.5*  HCT 39.2  PLT 172  MCV 93.6  MCH 29.8  MCHC 31.9  RDW 17.0*    Chemistries   Recent Labs Lab  05/01/15 1343 05/02/15 0443 05/03/15 0345  NA 132* 130* 126*  K 4.9 4.5 4.1  CL 96* 89* 88*  CO2 22 28 26   GLUCOSE 161* 89 90  BUN 17 16 17   CREATININE 1.03 1.06 1.02  CALCIUM 9.0 9.2 8.2*   ------------------------------------------------------------------------------------------------------------------ estimated creatinine clearance is 47.1 mL/min (by C-G formula based on Cr of 1.02). ------------------------------------------------------------------------------------------------------------------  Recent Labs  05/01/15 1717  HGBA1C 6.0*   ------------------------------------------------------------------------------------------------------------------ No results for input(s): CHOL, HDL, LDLCALC, TRIG, CHOLHDL, LDLDIRECT in the  last 72 hours. ------------------------------------------------------------------------------------------------------------------  Recent Labs  05/01/15 1717  TSH 3.335   ------------------------------------------------------------------------------------------------------------------ No results for input(s): VITAMINB12, FOLATE, FERRITIN, TIBC, IRON, RETICCTPCT in the last 72 hours.  Coagulation profile No results for input(s): INR, PROTIME in the last 168 hours.  No results for input(s): DDIMER in the last 72 hours.  Cardiac Enzymes  Recent Labs Lab 05/01/15 2046 05/02/15 0443  TROPONINI 0.46* 0.74*   ------------------------------------------------------------------------------------------------------------------ Invalid input(s): POCBNP   Recent Labs  05/01/15 2145 05/02/15 0623 05/02/15 2051 05/03/15 0621 05/03/15 1146  GLUCAP 107* 94 106* 93 112*     RAI,RIPUDEEP M.D. [facility] 05/03/2015, 12:26 PM  Pager: [phone] Between 7am to 7pm - call Pager - [phone]  After 7pm go to [email or web address] - password [email or web address]  Call night coverage person covering after 7pm

## 2015-05-04 ENCOUNTER — Encounter (HOSPITAL_COMMUNITY): Payer: Self-pay | Admitting: Physician Assistant

## 2015-05-04 ENCOUNTER — Inpatient Hospital Stay (HOSPITAL_COMMUNITY): Payer: Medicare Other

## 2015-05-04 DIAGNOSIS — I5043 Acute on chronic combined systolic (congestive) and diastolic (congestive) heart failure: Secondary | ICD-10-CM

## 2015-05-04 DIAGNOSIS — R079 Chest pain, unspecified: Secondary | ICD-10-CM

## 2015-05-04 LAB — BASIC METABOLIC PANEL
ANION GAP: 9 (ref 5–15)
BUN: 18 mg/dL (ref 6–20)
CO2: 28 mmol/L (ref 22–32)
Calcium: 8.3 mg/dL — ABNORMAL LOW (ref 8.9–10.3)
Chloride: 88 mmol/L — ABNORMAL LOW (ref 101–111)
Creatinine, Ser: 1.14 mg/dL (ref 0.61–1.24)
GFR calc Af Amer: >60 mL/min (ref >60)
GFR calc non Af Amer: 57 mL/min — ABNORMAL LOW (ref >60)
GLUCOSE: 93 mg/dL (ref 65–99)
POTASSIUM: 4.4 mmol/L (ref 3.5–5.1)
Sodium: 125 mmol/L — ABNORMAL LOW (ref 135–145)

## 2015-05-04 LAB — GLUCOSE, CAPILLARY
GLUCOSE-CAPILLARY: 107 mg/dL — AB (ref 65–99)
GLUCOSE-CAPILLARY: 79 mg/dL (ref 65–99)
GLUCOSE-CAPILLARY: 105 mg/dL — AB (ref 65–99)
Glucose-Capillary: 84 mg/dL (ref 65–99)

## 2015-05-04 MED ORDER — FUROSEMIDE 40 MG PO TABS
40.0000 mg | ORAL_TABLET | Freq: Every day | ORAL | Status: DC
  Administered 2015-05-05: 40 mg via ORAL
  Filled 2015-05-04: qty 1

## 2015-05-04 NOTE — Progress Notes (Signed)
[facility]                                                                              Patient Demographics  Hunter Choi, is a 82 y.o. male, DOB - Mar 26, 1928, [id number]  Admit date - 05/01/2015   Admitting Physician Edsel Petrin, DO  Outpatient Primary MD for the patient is Shamleffer, Landry Mellow, MD  LOS - 3   Chief Complaint  Patient presents with  . Shortness of Breath  . Chest Pain       Brief HPI   Hunter Choi is a very pleasant 81 y.o. male with a past medical history that includes CAD, hypertension, hyperlipidemia, chronic hyponatremia chronic thrombocytopenia chronic combined heart failure, bioprosthetic aortic valve presented to ED with dyspnea and chest pain. Patient's family reported reported a 3 day history of gradually worsening shortness of breath. Associated symptoms include intermittent left anterior chest pain. He denied any cough lower extremity edema or orthopnea. He denies palpitations diaphoresis nausea vomiting abdominal pain. He denies dysuria hematuria frequency or urgency. Family does indicate that he has not been taking his Lasix that was prescribed at last hospitalization as they did not understand it was a daily medication. In the emergency department he is afebrile hemodynamically stable and not hypoxic. He is given 40 mg of Lasix intravenously   Assessment & Plan    Principal Problem: Acute on chronic combined heart failure. Echo done in October 2016 mild LVH. EF 40-45% was diffuse hypokinesis, grade 1 diastolic dysfunction.  Likely related to the fact he's not been taking his Lasix due to misunderstanding at discharge BNP over 4500 - Continue IV Lasix, on 60 mg IV BID, per cardiology, transitioned to oral Lasix in a.m., outpatient follow-up with cardiology  - Negative balance of 2.0 L  -Continue ACE inhibitor and beta blocker   Chest pain. Some typical and atypical features. Heart score 4. Pain-free admission.  Initial troponin 0.05. EKG without acute changes. No chest pain at this time - Per cardiology likely due to demand ischemia in the setting of acute CHF, no further ischemic workup - 2-D echo showed EF of 20-25% with hypokinesis diffuse, grade 1 diastolic dysfunction, prior echo in 10/16 had shown EF of 40-45%.   Abdominal aortic aneurysm without rupture. He is surveilled every 6 months. Wife indicates he has an appointment Monday for checkup. Also has history of aortic valve disease, status post TAVR in 10/14, follows Dr. Verdis Prime -Wife to notify Dr. Edilia Bo  Hyponatremia. Chronic hyponatremia, has chronically low sodium - Sodium trending down, on IV diuresis. Monitor closely - Serum osmolarity 282, urine osmolality 276   Rheumatoid arthritis. Stable at baseline -Continue home meds -Physical therapy  Hyperglycemia. -hemoglobin A1c 6.0  -Sliding scale insulin  Code Status: Full code  Family Communication: Discussed in detail with the patient, all imaging results, lab results explained to the patient    Disposition Plan:  PT evaluation  Time Spent in minutes 25 minutes  Procedures  none  Consults   none  DVT Prophylaxis  Lovenox   Medications  Scheduled Meds: . amLODipine  2.5 mg Oral Q1500  .  aspirin EC  81 mg Oral Daily  . enoxaparin (LOVENOX) injection  30 mg Subcutaneous Q24H  . folic acid  3 mg Oral Daily  . furosemide  60 mg Intravenous BID  . [START ON 05/05/2015] furosemide  40 mg Oral Daily  . insulin aspart  0-15 Units Subcutaneous TID WC  . insulin aspart  0-5 Units Subcutaneous QHS  . levothyroxine  50 mcg Oral QAC breakfast  . lisinopril  2.5 mg Oral Daily  . methotrexate  25 mg Oral Weekly  . metoprolol tartrate  25 mg Oral BID  . pantoprazole  40 mg Oral Daily  . polyethylene glycol  17 g Oral BID  . simvastatin  20 mg Oral QPM  . sodium chloride flush  3 mL Intravenous Q12H   Continuous Infusions:  PRN Meds:.sodium chloride, acetaminophen **OR**  acetaminophen, ondansetron **OR** ondansetron (ZOFRAN) IV, sodium chloride flush   Antibiotics   Anti-infectives    None        Subjective:   Hunter Choi was seen and examined today. He denies any specific complaints no chest pain.. Patient denies dizziness,  shortness of breath, abdominal pain, N/V/D/C, new weakness, numbess, tingling. No acute events overnight.    Objective:   Blood pressure 124/69, pulse 80, temperature 98 F (36.7 C), temperature source Oral, resp. rate 16, height 5\' 7"  (1.702 m), weight 62.851 kg (138 lb 9 oz), SpO2 96 %.  Wt Readings from Last 3 Encounters:  05/04/15 62.851 kg (138 lb 9 oz)  02/03/15 60.2 kg (132 lb 11.5 oz)  01/14/15 68.493 kg (151 lb)     Intake/Output Summary (Last 24 hours) at 05/04/15 1242 Last data filed at 05/04/15 1219  Gross per 24 hour  Intake    600 ml  Output    601 ml  Net     -1 ml    Exam  General: Alert and oriented x 3, NAD  HEENT:  PERRLA, EOMI   Neck: Supple, no JVD, no masses  CVS: S1 S2 auscultated, 1/6 systolic murmur   Respiratory: Decreased breath sounds at the bases  Abdomen: Soft, nontender, nondistended, + bowel sounds  Ext: no cyanosis clubbing or edema  Neuro:  no new deficits  skin: No rashes  Psych: Normal affect and demeanor, alert and oriented x3    Data Review   Micro Results No results found for this or any previous visit (from the past 240 hour(s)).  Radiology Reports Dg Chest 2 View  05/01/2015  CLINICAL DATA:  Chest pain and weakness for 2 days EXAM: CHEST  2 VIEW COMPARISON:  11/14/2014 FINDINGS: Previous median sternotomy and CABG procedure. Aortic atherosclerosis noted. There is mild cardiac enlargement. Bilateral pleural effusions are present in areas mild to moderate interstitial edema consistent with CHF. Atelectasis is noted in the lung bases. IMPRESSION: 1. Congestive heart failure. 2. Decreased aeration to the lung bases. Electronically Signed   By: 11/16/2014  M.D.   On: 05/01/2015 14:05    CBC  Recent Labs Lab 05/01/15 1343  WBC 9.4  HGB 12.5*  HCT 39.2  PLT 172  MCV 93.6  MCH 29.8  MCHC 31.9  RDW 17.0*    Chemistries   Recent Labs Lab 05/01/15 1343 05/02/15 0443 05/03/15 0345 05/04/15 0449  NA 132* 130* 126* 125*  K 4.9 4.5 4.1 4.4  CL 96* 89* 88* 88*  CO2 22 28 26 28   GLUCOSE 161* 89 90 93  BUN 17 16 17 18   CREATININE 1.03 1.06  1.02 1.14  CALCIUM 9.0 9.2 8.2* 8.3*   ------------------------------------------------------------------------------------------------------------------ estimated creatinine clearance is 42.1 mL/min (by C-G formula based on Cr of 1.14). ------------------------------------------------------------------------------------------------------------------  Recent Labs  05/01/15 1717  HGBA1C 6.0*   ------------------------------------------------------------------------------------------------------------------ No results for input(s): CHOL, HDL, LDLCALC, TRIG, CHOLHDL, LDLDIRECT in the last 72 hours. ------------------------------------------------------------------------------------------------------------------  Recent Labs  05/01/15 1717  TSH 3.335   ------------------------------------------------------------------------------------------------------------------ No results for input(s): VITAMINB12, FOLATE, FERRITIN, TIBC, IRON, RETICCTPCT in the last 72 hours.  Coagulation profile No results for input(s): INR, PROTIME in the last 168 hours.  No results for input(s): DDIMER in the last 72 hours.  Cardiac Enzymes  Recent Labs Lab 05/01/15 2046 05/02/15 0443  TROPONINI 0.46* 0.74*   ------------------------------------------------------------------------------------------------------------------ Invalid input(s): POCBNP   Recent Labs  05/03/15 0621 05/03/15 1146 05/03/15 1628 05/03/15 2106 05/04/15 0659 05/04/15 1149  GLUCAP 93 112* 96 111* 84 107*     Prathik Aman  M.D. [facility] 05/04/2015, 12:42 PM  Pager: [phone] Between 7am to 7pm - call Pager - [phone]  After 7pm go to [email or web address] - password [email or web address]  Call night coverage person covering after 7pm

## 2015-05-04 NOTE — Consult Note (Signed)
CARDIOLOGY CONSULT NOTE   Patient ID: Hunter Choi MRN: [id number] DOB/AGE: December 30, 1927 82 y.o.  Admit date: 05/01/2015  Consulting Physician: Dr. Isidoro Donning Primary Physician   Shamleffer, Landry Mellow, MD Primary Cardiologist: Dr. Katrinka Blazing  Reason for Consultation: CHF  HPI: Hunter Choi is a 82 y.o. 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   Last seen by Dr. Katrinka Blazing in 04/2014 and doing well from a cardiology standpoint. Last [facility] in 2014 for TAVR showed patent bypass grafts.   He was admitted and 10/2014 for acute cholecystitis and sent home after tube placement and antibiotics. He was readmitted in 01/2015 for laparoscopic cholecystectomy. Discharge delayed secondary to worsening hyponatremia. At that time he was discharged with daily Lasix therapy 20 mg. However they were confused and did not take this as a did not realize this was a daily medication.  History is limited due to extreme hard of hearing. I called the wife on the phone who reported that he had a three-day history of gradually worsening shortness of breath as well as intermittent left anterior chest pain. The chest pain was described as sharp. No lower extremity edema or orthopnea.   In the ED, CXR with CHF and BNP >4500. Also troponin noted to be elevated 0.46-->0.74. He was started on 60 mg IV Lasix twice a day. He is net negative -1.6 L and weight down 2 pounds. He is feeling much improved with complete resolution of chest pain. He is asking when he can go home.      Past Medical History  Diagnosis Date  . Hypertension   . GERD (gastroesophageal  reflux disease)   . Aortic stenosis, severe   . Embolism involving retinal artery   . BPH (benign prostatic hypertrophy)     Dr. Retta Diones  . Iliac artery aneurysm (HCC)     Dr. Edilia Bo  . AAA (abdominal aortic aneurysm) (HCC)     Dr. Ashley Royalty  . Diastolic heart failure (HCC)   . Coronary artery disease     Left main disease and severe 3-vessel CAD  . CAD (coronary artery disease) of bypass graft     Chronic occlusion of saphenous vein graft to RCA  . Hypothyroidism   . Heart murmur   . Stroke [facility])     mini stroke effective his eye left  . Arthritis   . 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 Atrial fibrillation (HCC)   . Anxiety   . Blind left eye      Past Surgical History  Procedure Laterality Date  . Hemiarthroplasty hip Left     AFTER HIP FX  . Total knee arthroplasty  1992-2002    right-left  . Biopsy prostate  2005  . Transurethral resection of prostate  2012  . Joint replacement    . Harvest bone graft  1982    FOR THE  ANKLE  . Skin grafts  1968  . Colonoscopy    . Bunionectomy with  hammertoe reconstruction Bilateral   . Transcatheter aortic valve replacement, transapical N/A 01/15/2013    Procedure: TRANSCATHETER AORTIC VALVE REPLACEMENT, TRANSAPICAL;  Surgeon: [name], MD;  Location: [facility] OR;  Service: Open Heart Surgery;  Laterality: N/A;  . Intraoperative transesophageal echocardiogram N/A 01/15/2013    Procedure: INTRAOPERATIVE TRANSESOPHAGEAL ECHOCARDIOGRAM;  Surgeon: [name], MD;  Location: [facility] OR;  Service: Open Heart Surgery;  Laterality: N/A;  . Left and right heart catheterization with coronary angiogram N/A 12/20/2012    Procedure: LEFT AND RIGHT HEART CATHETERIZATION WITH CORONARY ANGIOGRAM;  Surgeon: Lesleigh Noe, MD;  Location: [facility] CATH LAB;  Service: Cardiovascular;  Laterality: N/A;  . Carotid endarterectomy Right Jan. 2,  1997  . Cardiac catheterization    . Total hip arthroplasty Left 2007  . Cataract extraction w/ intraocular lens implant Right   . Coronary artery bypass graft  01/1998    Dr. Sheliah Plane; LIMA to LAD, SVG to D1, SVG to LCx, SVG to RCA, open saphenous vein harvest via right lower extremity  . Cholecystectomy N/A 01/28/2015    Procedure: LAPAROSCOPIC CHOLECYSTECTOMY WITH INTRAOPERATIVE CHOLANGIOGRAM;  Surgeon: Gaynelle Adu, MD;  Location: [facility] OR;  Service: General;  Laterality: N/A;    Allergies  Allergen Reactions  . Ciprofloxacin Other (See Comments)    REACTION: mild delirium    I have reviewed the patient's current medications . amLODipine  2.5 mg Oral Q1500  . aspirin EC  81 mg Oral Daily  . enoxaparin (LOVENOX) injection  30 mg Subcutaneous Q24H  . folic acid  3 mg Oral Daily  . furosemide  60 mg Intravenous BID  . insulin aspart  0-15 Units Subcutaneous TID WC  . insulin aspart  0-5 Units Subcutaneous QHS  . levothyroxine  50 mcg Oral QAC breakfast  . lisinopril  2.5 mg Oral Daily  . methotrexate  25 mg Oral Weekly  . metoprolol tartrate  25 mg Oral BID  . pantoprazole  40 mg Oral Daily  . polyethylene glycol  17 g Oral BID  . simvastatin  20 mg Oral QPM  . sodium chloride flush  3 mL Intravenous Q12H     sodium chloride, acetaminophen **OR** acetaminophen, ondansetron **OR** ondansetron (ZOFRAN) IV, sodium chloride flush  Prior to Admission medications   Medication Sig Start Date End Date Taking? Authorizing Provider  acetaminophen (TYLENOL) 325 MG tablet Take 2 tablets (650 mg total) by mouth every 6 (six) hours as needed for mild pain, moderate pain, fever or headache. 02/03/15  Yes Elease Etienne, MD  amLODipine (NORVASC) 5 MG tablet Take 0.5 tablets (2.5 mg total) by mouth daily at 3 pm. 02/03/15  Yes Elease Etienne, MD  aspirin EC 81 MG EC tablet Take 1 tablet (81 mg total) by mouth daily. 01/21/13  Yes Donielle Margaretann Loveless, PA-C  folic acid (FOLVITE) 1 MG  tablet Take 3 mg by mouth daily.   Yes Historical Provider, MD  furosemide (LASIX) 20 MG tablet Take 1 tablet (20 mg total) by mouth daily. 02/03/15  Yes Elease Etienne, MD  levothyroxine (SYNTHROID, LEVOTHROID) 50 MCG tablet Take 50 mcg by mouth daily. 11/06/14  Yes Historical Provider, MD  lisinopril (PRINIVIL,ZESTRIL) 2.5 MG tablet Take 1 tablet (2.5 mg total) by mouth daily. 02/03/15  Yes Elease Etienne, MD  methotrexate 2.5 MG tablet Take 25 mg by mouth every Friday.    Yes Historical Provider, MD  metoprolol tartrate (LOPRESSOR) 25 MG tablet Take 1 tablet (25 mg total)  by mouth 2 (two) times daily. 02/03/15  Yes Elease Etienne, MD  omeprazole (PRILOSEC) 20 MG capsule Take 20 mg by mouth daily.   Yes Historical Provider, MD  polyethylene glycol (MIRALAX / GLYCOLAX) packet Take 17 g by mouth 2 (two) times daily. 02/03/15  Yes Elease Etienne, MD  predniSONE (DELTASONE) 5 MG tablet Take 5 mg by mouth daily. 04/07/15  Yes Historical Provider, MD  simvastatin (ZOCOR) 20 MG tablet Take 20 mg by mouth every evening. 12/20/14  Yes Historical Provider, MD     Social History   Social History  . Marital Status: Married    Spouse Name: N/A  . Number of Children: N/A  . Years of Education: N/A   Occupational History  . Not on file.   Social History Main Topics  . Smoking status: Former Smoker -- 0.50 packs/day for 47 years    Types: Cigarettes    Quit date: 03/28/1958  . Smokeless tobacco: Former Neurosurgeon    Types: Chew     Comment: ONLY USED 2 YEARS  . Alcohol Use: No  . Drug Use: No  . Sexual Activity: Not on file   Other Topics Concern  . Not on file   Social History Narrative    Family Status  Relation Status Death Age  . Mother Deceased 36  . Son Alive   . Father Deceased 54  . Brother Deceased 70  . Son Alive    Family History  Problem Relation Age of Onset  . Heart attack Mother   . Heart disease Mother     Before age 88  . Hyperlipidemia Mother   . Hypertension  Mother   . Hypertension Son   . Heart disease Father     AAA-   . Hyperlipidemia Father   . Heart attack Father   . Hypertension Father   . Heart disease Brother     Before age 37  . Hyperlipidemia Brother   . Heart attack Brother   . Hypertension Brother      ROS:  Full 14 point review of systems complete and found to be negative unless listed above.  Physical Exam: Blood pressure 124/69, pulse 80, temperature 98 F (36.7 C), temperature source Oral, resp. rate 16, height 5\' 7"  (1.702 m), weight 138 lb 9 oz (62.851 kg), SpO2 96 %.  General: Well developed, well nourished, male in no acute distress Head: Eyes PERRLA, No xanthomas.   Normocephalic and atraumatic, oropharynx without edema or exudate.  Lungs: decreased breath sounds at bases Heart: HRRR S1 S2, no rub/gallop, Heart regular rate and rhythm with S1, S2  murmur. pulses are 2+ extrem.   Neck: No carotid bruits. No lymphadenopathy.  No JVD Abdomen: Bowel sounds present, abdomen soft and non-tender without masses or hernias noted. Msk:  No spine or cva tenderness. No weakness, no joint deformities or effusions. Extremities: No clubbing or cyanosis. No LE edema.  Neuro: Alert and oriented X 3. No focal deficits noted. Psych:  Good affect, responds appropriately Skin: No rashes or lesions noted.  Labs:   Lab Results  Component Value Date   WBC 9.4 05/01/2015   HGB 12.5* 05/01/2015   HCT 39.2 05/01/2015   MCV 93.6 05/01/2015   PLT 172 05/01/2015   No results for input(s): INR in the last 72 hours.  Recent Labs Lab 05/04/15 0449  NA 125*  K 4.4  CL 88*  CO2 28  BUN 18  CREATININE 1.14  CALCIUM 8.3*  GLUCOSE 93   MAGNESIUM  Date Value Ref Range Status  02/01/2015 1.9 1.7 - 2.4 mg/dL Final    Recent Labs  71/24/58 2046 05/02/15 0443  TROPONINI 0.46* 0.74*    Recent Labs  05/01/15 1427  TROPIPOC 0.05    LIPASE  Date/Time Value Ref Range Status  11/14/2014 05:30 PM 43 22 - 51 U/L Final   TSH    Date/Time Value Ref Range Status  05/01/2015 05:17 PM 3.335 0.350 - 4.500 uIU/mL Final  05/24/2014 06:05 AM 1.497 0.350 - 4.500 uIU/mL Final    Echo: 01/07/2015 LV EF: 40% -   45% Study Conclusions - Left ventricle: The cavity size was normal. Wall thickness was   increased in a pattern of mild LVH. Systolic function was mildly   to moderately reduced. The estimated ejection fraction was in the   range of 40% to 45%. Diffuse hypokinesis. Doppler parameters are   consistent with abnormal left ventricular relaxation (grade 1   diastolic dysfunction). - Aortic valve: A bioprosthesis was present. There was mild   regurgitation. Valve area (VTI): 1.09 cm^2. Valve area (Vmax):   1.08 cm^2. Valve area (Vmean): 1.04 cm^2. - Mitral valve: Mildly to moderately calcified annulus. There was   mild regurgitation.   ECG:  NSR with LBBB and PVCs  Radiology:  No results found.  ASSESSMENT AND PLAN:    Principal Problem:   Acute on chronic heart failure (HCC) Active Problems:   Rheumatoid arthritis (HCC)   GERD (gastroesophageal reflux disease)   Thyroid disease   BPH (benign prostatic hypertrophy)   AAA (abdominal aortic aneurysm) without rupture (HCC)   Arterial hypotension   Hyponatremia   Chest pain at rest   Hyperglycemia   Hunter Choi is a 82 y.o. 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   Acute on chronic combined A/D CHF: BNP >4500 and CXR with CHF. -- Placed on 60mg  IV lasix BID. Net negative 1.6L. Weight down 2 lbs. Feeling much better. He will need to go home on maintenance Lasix therapy. Will likely start him  on 40 mg by mouth Lasix tomorrow morning with one-week transition of care appointment with me ( already scheduled for 05/12/15). Will have to watch BMET carefully with history of chronic hyponatremia.  Elevated troponin: 0.46--> 0.74 likely demand ischemia in the setting of acute CHF. Chest pain now completely resolved.  Would not pursue further ischemic w/up  HTN: BP well controlled on current regimen  CAD s/p CABG in 1999: pre TAVR cath in 2014 with patent bypass grafts. Continue ASA 81mg , BB and statin.   Chronic hyponatremia (baseline sodium in the mid 120s): As above watch carefully on daily Lasix.  CVA: cont ASA.   HLD: continue statin   Signed: Janetta Hora, PA-C 05/04/2015 9:06 AM  Pager [phone]  Co-Sign MD  Patient seen, examined. Available data reviewed. Agree with findings, assessment, and plan as outlined by Carlean Jews, PA-C. The patient was independently interviewed and examined. He is very hard of hearing. He's a pleasant elderly gentleman in no distress. His lung fields show  prolonged expiration but are otherwise clear. Heart is regular rate and rhythm with distant heart sounds. No murmur appreciated. No peripheral edema on exam. Lab and radiographic data reviewed. The patient is known to me from undergoing TAVR about 2 years ago. His last echocardiogram in October 2016 is reviewed and it demonstrated mild to moderate LV systolic dysfunction with essentially normal function of his aortic valve prosthesis with mild paravalvular insufficiency. I think that he can be transitioned to oral Lasix in the morning. He does have chronic hyponatremia and this will have to be followed closely as an outpatient. He was not taking any oral diuretics prior to admission. Anticipate transitioning him to Lasix 40 mg once daily. We'll reassess tomorrow morning.  Tonny Bollman, M.D. 05/04/2015 12:50 PM

## 2015-05-04 NOTE — Care Management Important Message (Signed)
Important Message  Patient Details  Name: Hunter Choi MRN: [id number] Date of Birth: 1928-01-31   Medicare Important Message Given:  Yes    [name] 05/04/2015, 12:26 PMImportant Message  Patient Details  Name: Hunter Choi MRN: [id number] Date of Birth: 1927/08/02   Medicare Important Message Given:  Yes    [name] 05/04/2015, 12:25 PM

## 2015-05-04 NOTE — Progress Notes (Signed)
CARDIOLOGY CONSULT NOTE   Patient ID: Hunter Choi DOB/AGE: [birth year] 82 y.o.  Admit date: 05/01/2015  Consulting Physician: Dr. Isidoro Donning Primary Physician   Shamleffer, Landry Mellow, MD Primary Cardiologist: Dr. Katrinka Blazing  Reason for Consultation: CHF  HPI: Hunter Choi is a 82 y.o. 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   Last seen by Dr. Katrinka Blazing in 04/2014 and doing well from a cardiology standpoint. Last [facility] in 2014 for TAVR showed patent bypass grafts.   He was admitted and 10/2014 for acute cholecystitis and sent home after tube placement and antibiotics. He was readmitted in 01/2015 for laparoscopic cholecystectomy. Discharge delayed secondary to worsening hyponatremia. At that time he was discharged with daily Lasix therapy 20 mg. However they were confused and did not take this as a did not realize this was a daily medication.  History is limited due to extreme hard of hearing. I called the wife on the phone who reported that he had a three-day history of gradually worsening shortness of breath as well as intermittent left anterior chest pain. The chest pain was described as sharp. No lower extremity edema or orthopnea.   In the ED, CXR with CHF and BNP >4500. Also troponin noted to be elevated 0.46-->0.74. He was started on 60 mg IV Lasix twice a day. He is net negative -1.6 L and weight down 2 pounds. He is feeling much improved with complete resolution of chest pain. He is asking when he can go home.      Past Medical History  Diagnosis Date  . Hypertension   . GERD (gastroesophageal  reflux disease)   . Aortic stenosis, severe   . Embolism involving retinal artery   . BPH (benign prostatic hypertrophy)     Dr. Retta Diones  . Iliac artery aneurysm (HCC)     Dr. Edilia Bo  . AAA (abdominal aortic aneurysm) (HCC)     Dr. Ashley Royalty  . Diastolic heart failure (HCC)   . Coronary artery disease     Left main disease and severe 3-vessel CAD  . CAD (coronary artery disease) of bypass graft     Chronic occlusion of saphenous vein graft to RCA  . Hypothyroidism   . Heart murmur   . Stroke [facility])     mini stroke effective his eye left  . Arthritis   . 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 Atrial fibrillation (HCC)   . Anxiety   . Blind left eye      Past Surgical History  Procedure Laterality Date  . Hemiarthroplasty hip Left     AFTER HIP FX  . Total knee arthroplasty  1992-2002    right-left  . Biopsy prostate  2005  . Transurethral resection of prostate  2012  . Joint replacement    . Harvest bone graft  1982    FOR THE  ANKLE  . Skin grafts  1968  . Colonoscopy    . Bunionectomy with  hammertoe reconstruction Bilateral   . Transcatheter aortic valve replacement, transapical N/A 01/15/2013    Procedure: TRANSCATHETER AORTIC VALVE REPLACEMENT, TRANSAPICAL;  Surgeon: [name], MD;  Location: [facility] OR;  Service: Open Heart Surgery;  Laterality: N/A;  . Intraoperative transesophageal echocardiogram N/A 01/15/2013    Procedure: INTRAOPERATIVE TRANSESOPHAGEAL ECHOCARDIOGRAM;  Surgeon: [name], MD;  Location: [facility] OR;  Service: Open Heart Surgery;  Laterality: N/A;  . Left and right heart catheterization with coronary angiogram N/A 12/20/2012    Procedure: LEFT AND RIGHT HEART CATHETERIZATION WITH CORONARY ANGIOGRAM;  Surgeon: Lesleigh Noe, MD;  Location: [facility] CATH LAB;  Service: Cardiovascular;  Laterality: N/A;  . Carotid endarterectomy Right Jan. 2,  1997  . Cardiac catheterization    . Total hip arthroplasty Left 2007  . Cataract extraction w/ intraocular lens implant Right   . Coronary artery bypass graft  01/1998    Dr. Sheliah Plane; LIMA to LAD, SVG to D1, SVG to LCx, SVG to RCA, open saphenous vein harvest via right lower extremity  . Cholecystectomy N/A 01/28/2015    Procedure: LAPAROSCOPIC CHOLECYSTECTOMY WITH INTRAOPERATIVE CHOLANGIOGRAM;  Surgeon: Gaynelle Adu, MD;  Location: [facility] OR;  Service: General;  Laterality: N/A;    Allergies  Allergen Reactions  . Ciprofloxacin Other (See Comments)    REACTION: mild delirium    I have reviewed the patient's current medications . amLODipine  2.5 mg Oral Q1500  . aspirin EC  81 mg Oral Daily  . enoxaparin (LOVENOX) injection  30 mg Subcutaneous Q24H  . folic acid  3 mg Oral Daily  . furosemide  60 mg Intravenous BID  . insulin aspart  0-15 Units Subcutaneous TID WC  . insulin aspart  0-5 Units Subcutaneous QHS  . levothyroxine  50 mcg Oral QAC breakfast  . lisinopril  2.5 mg Oral Daily  . methotrexate  25 mg Oral Weekly  . metoprolol tartrate  25 mg Oral BID  . pantoprazole  40 mg Oral Daily  . polyethylene glycol  17 g Oral BID  . simvastatin  20 mg Oral QPM  . sodium chloride flush  3 mL Intravenous Q12H     sodium chloride, acetaminophen **OR** acetaminophen, ondansetron **OR** ondansetron (ZOFRAN) IV, sodium chloride flush  Prior to Admission medications   Medication Sig Start Date End Date Taking? Authorizing Provider  acetaminophen (TYLENOL) 325 MG tablet Take 2 tablets (650 mg total) by mouth every 6 (six) hours as needed for mild pain, moderate pain, fever or headache. 02/03/15  Yes Elease Etienne, MD  amLODipine (NORVASC) 5 MG tablet Take 0.5 tablets (2.5 mg total) by mouth daily at 3 pm. 02/03/15  Yes Elease Etienne, MD  aspirin EC 81 MG EC tablet Take 1 tablet (81 mg total) by mouth daily. 01/21/13  Yes Donielle Margaretann Loveless, PA-C  folic acid (FOLVITE) 1 MG  tablet Take 3 mg by mouth daily.   Yes Historical Provider, MD  furosemide (LASIX) 20 MG tablet Take 1 tablet (20 mg total) by mouth daily. 02/03/15  Yes Elease Etienne, MD  levothyroxine (SYNTHROID, LEVOTHROID) 50 MCG tablet Take 50 mcg by mouth daily. 11/06/14  Yes Historical Provider, MD  lisinopril (PRINIVIL,ZESTRIL) 2.5 MG tablet Take 1 tablet (2.5 mg total) by mouth daily. 02/03/15  Yes Elease Etienne, MD  methotrexate 2.5 MG tablet Take 25 mg by mouth every Friday.    Yes Historical Provider, MD  metoprolol tartrate (LOPRESSOR) 25 MG tablet Take 1 tablet (25 mg total)  by mouth 2 (two) times daily. 02/03/15  Yes Elease Etienne, MD  omeprazole (PRILOSEC) 20 MG capsule Take 20 mg by mouth daily.   Yes Historical Provider, MD  polyethylene glycol (MIRALAX / GLYCOLAX) packet Take 17 g by mouth 2 (two) times daily. 02/03/15  Yes Elease Etienne, MD  predniSONE (DELTASONE) 5 MG tablet Take 5 mg by mouth daily. 04/07/15  Yes Historical Provider, MD  simvastatin (ZOCOR) 20 MG tablet Take 20 mg by mouth every evening. 12/20/14  Yes Historical Provider, MD     Social History   Social History  . Marital Status: Married    Spouse Name: N/A  . Number of Children: N/A  . Years of Education: N/A   Occupational History  . Not on file.   Social History Main Topics  . Smoking status: Former Smoker -- 0.50 packs/day for 47 years    Types: Cigarettes    Quit date: 03/28/1958  . Smokeless tobacco: Former Neurosurgeon    Types: Chew     Comment: ONLY USED 2 YEARS  . Alcohol Use: No  . Drug Use: No  . Sexual Activity: Not on file   Other Topics Concern  . Not on file   Social History Narrative    Family Status  Relation Status Death Age  . Mother Deceased 36  . Son Alive   . Father Deceased 54  . Brother Deceased 70  . Son Alive    Family History  Problem Relation Age of Onset  . Heart attack Mother   . Heart disease Mother     Before age 88  . Hyperlipidemia Mother   . Hypertension  Mother   . Hypertension Son   . Heart disease Father     AAA-   . Hyperlipidemia Father   . Heart attack Father   . Hypertension Father   . Heart disease Brother     Before age 37  . Hyperlipidemia Brother   . Heart attack Brother   . Hypertension Brother      ROS:  Full 14 point review of systems complete and found to be negative unless listed above.  Physical Exam: Blood pressure 124/69, pulse 80, temperature 98 F (36.7 C), temperature source Oral, resp. rate 16, height 5\' 7"  (1.702 m), weight 138 lb 9 oz (62.851 kg), SpO2 96 %.  General: Well developed, well nourished, male in no acute distress Head: Eyes PERRLA, No xanthomas.   Normocephalic and atraumatic, oropharynx without edema or exudate.  Lungs: decreased breath sounds at bases Heart: HRRR S1 S2, no rub/gallop, Heart regular rate and rhythm with S1, S2  murmur. pulses are 2+ extrem.   Neck: No carotid bruits. No lymphadenopathy.  No JVD Abdomen: Bowel sounds present, abdomen soft and non-tender without masses or hernias noted. Msk:  No spine or cva tenderness. No weakness, no joint deformities or effusions. Extremities: No clubbing or cyanosis. No LE edema.  Neuro: Alert and oriented X 3. No focal deficits noted. Psych:  Good affect, responds appropriately Skin: No rashes or lesions noted.  Labs:   Lab Results  Component Value Date   WBC 9.4 05/01/2015   HGB 12.5* 05/01/2015   HCT 39.2 05/01/2015   MCV 93.6 05/01/2015   PLT 172 05/01/2015   No results for input(s): INR in the last 72 hours.  Recent Labs Lab 05/04/15 0449  NA 125*  K 4.4  CL 88*  CO2 28  BUN 18  CREATININE 1.14  CALCIUM 8.3*  GLUCOSE 93   MAGNESIUM  Date Value Ref Range Status  02/01/2015 1.9 1.7 - 2.4 mg/dL Final    Recent Labs  21/19/41 2046 05/02/15 0443  TROPONINI 0.46* 0.74*    Recent Labs  05/01/15 1427  TROPIPOC 0.05    LIPASE  Date/Time Value Ref Range Status  11/14/2014 05:30 PM 43 22 - 51 U/L Final   TSH    Date/Time Value Ref Range Status  05/01/2015 05:17 PM 3.335 0.350 - 4.500 uIU/mL Final  05/24/2014 06:05 AM 1.497 0.350 - 4.500 uIU/mL Final    Echo: 01/07/2015 LV EF: 40% -   45% Study Conclusions - Left ventricle: The cavity size was normal. Wall thickness was   increased in a pattern of mild LVH. Systolic function was mildly   to moderately reduced. The estimated ejection fraction was in the   range of 40% to 45%. Diffuse hypokinesis. Doppler parameters are   consistent with abnormal left ventricular relaxation (grade 1   diastolic dysfunction). - Aortic valve: A bioprosthesis was present. There was mild   regurgitation. Valve area (VTI): 1.09 cm^2. Valve area (Vmax):   1.08 cm^2. Valve area (Vmean): 1.04 cm^2. - Mitral valve: Mildly to moderately calcified annulus. There was   mild regurgitation.   ECG:  NSR with LBBB and PVCs  Radiology:  No results found.  ASSESSMENT AND PLAN:    Principal Problem:   Acute on chronic heart failure (HCC) Active Problems:   Rheumatoid arthritis (HCC)   GERD (gastroesophageal reflux disease)   Thyroid disease   BPH (benign prostatic hypertrophy)   AAA (abdominal aortic aneurysm) without rupture (HCC)   Arterial hypotension   Hyponatremia   Chest pain at rest   Hyperglycemia   Hunter Choi is a 82 y.o. 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   Acute on chronic combined A/D CHF: BNP >4500 and CXR with CHF. -- Placed on 60mg  IV lasix BID. Net negative 1.6L. Weight down 2 lbs. Feeling much better. He will need to go home on maintenance Lasix therapy. Will likely start him  on 40 mg by mouth Lasix tomorrow morning with one-week transition of care appointment with me ( already scheduled for 05/12/15). Will have to watch BMET carefully with history of chronic hyponatremia.  Elevated troponin: 0.46--> 0.74 likely demand ischemia in the setting of acute CHF. Chest pain now completely resolved.  Would not pursue further ischemic w/up  HTN: BP well controlled on current regimen  CAD s/p CABG in 1999: pre TAVR cath in 2014 with patent bypass grafts. Continue ASA 81mg , BB and statin.   Chronic hyponatremia (baseline sodium in the mid 120s): As above watch carefully on daily Lasix.  CVA: cont ASA.   HLD: continue statin   Signed: 2015, PA-C 05/04/2015 9:06 AM  Pager Janetta Hora  Co-Sign MD

## 2015-05-04 NOTE — Evaluation (Signed)
Physical Therapy Evaluation Patient Details Name: Hunter Choi MRN: [id number] DOB: 07/23/1927 Today's Date: 05/04/2015   History of Present Illness  82 year old male with a history of coronary artery disease, combined congestive heart failure, that presented to the emergency department with complaints of shortness of breath and chest pain  Clinical Impression  Patient demonstrates deficits in functional mobility as indicated below. Will need continued skilled PT to address deficits and maximize function. Will see as indicated and progress as tolerated. Patient with questionable cognition and appropriateness during session. Poor historian despite alert and oriented x2. If family is unable to provide adequate level of assist will need SNF.    Follow Up Recommendations Home health PT;Supervision for mobility/OOB;Supervision/Assistance - 24 hour (if unable to provide may need ST SNF)    Equipment Recommendations  Other (comment) (TBD)    Recommendations for Other Services       Precautions / Restrictions Precautions Precautions: Fall Restrictions Weight Bearing Restrictions: No      Mobility  Bed Mobility Overal bed mobility: Needs Assistance Bed Mobility: Supine to Sit;Sit to Supine     Supine to sit: Min assist Sit to supine: Min assist   General bed mobility comments: patient saturated in urine, assist required to elevate to EOB, patient easily distracted and poor ability to comply with task at hand  Transfers Overall transfer level: Needs assistance Equipment used: Rolling walker (2 wheeled) Transfers: Sit to/from [employer] Sit to Stand: Min assist Stand pivot transfers: Min assist       General transfer comment: Min assist to elevate to standing, multiple LOB despite use of RW. Max cues for safety and positioning. Patient very impulsive and difficulty to control on task  Ambulation/Gait Ambulation/Gait assistance: Min assist Ambulation Distance  (Feet): 30 Feet Assistive device: Rolling walker (2 wheeled) Gait Pattern/deviations: Step-through pattern;Decreased stride length;Narrow base of support;Trunk flexed;Staggering left;Staggering right Gait velocity: decreaed Gait velocity interpretation: Below normal speed for age/gender General Gait Details: instability noted with use of RW. Poor compliance with cues. Patient veering into objects in room and assist required to prevent fall  Stairs            Wheelchair Mobility    Modified Rankin (Stroke Patients Only)       Balance Overall balance assessment: Needs assistance   Sitting balance-Leahy Scale: Fair     Standing balance support: Bilateral upper extremity supported;During functional activity Standing balance-Leahy Scale: Poor Standing balance comment: poor use of RW, instability with posterior LOB noted                             Pertinent Vitals/Pain Pain Assessment: No/denies pain    Home Living Family/patient expects to be discharged to:: Private residence Living Arrangements: Spouse/significant other;Children Available Help at Discharge: Family;Available 24 hours/day Type of Home: House Home Access: Stairs to enter Entrance Stairs-Rails: Doctor, general practice of Steps: 3 Home Layout: One level Home Equipment: Walker - 4 wheels;Cane - single point;Shower seat      Prior Function Level of Independence: Independent with assistive device(s)         Comments: PTA pt used walker at all times, denies need for assist w/ ambulation and ADLs however,k question reliability as historian     Hand Dominance   Dominant Hand: Right    Extremity/Trunk Assessment   Upper Extremity Assessment: Generalized weakness (+ arthritic changes)           Lower Extremity  Assessment: Generalized weakness;Difficult to assess due to impaired cognition (patient difficult to follow commands for assessment )      Cervical / Trunk  Assessment: Kyphotic  Communication   Communication: HOH  Cognition Arousal/Alertness: Awake/alert Behavior During Therapy: Impulsive Overall Cognitive Status: No family/caregiver present to determine baseline cognitive functioning Area of Impairment: Orientation;Following commands;Safety/judgement;Awareness;Problem solving Orientation Level: Disoriented to;Situation   Memory: Decreased short-term memory Following Commands: Follows one step commands with increased time Safety/Judgement: Decreased awareness of safety;Decreased awareness of deficits Awareness: Intellectual Problem Solving: Slow processing;Decreased initiation;Requires verbal cues;Requires tactile cues;Difficulty sequencing General Comments: Patient questionable cognitive functioning, did not seem concerned regarding laying in soiled bed linens, patient also asking inappropriate questions throughout session    General Comments General comments (skin integrity, edema, etc.): assisted patient with pericare and hygiene x3 (multiple bouts of urination during activity with poor ability to remain continent. Assisted with self care and hygiene, patient required MAX cues to direct to task and still could not carry out basic self care.    Exercises        Assessment/Plan    PT Assessment Patient needs continued PT services  PT Diagnosis Difficulty walking;Abnormality of gait;Altered mental status;Generalized weakness   PT Problem List Decreased strength;Decreased range of motion;Decreased activity tolerance;Decreased balance;Decreased mobility;Decreased cognition;Decreased coordination;Decreased knowledge of use of DME;Decreased safety awareness  PT Treatment Interventions DME instruction;Gait training;Functional mobility training;Therapeutic activities;Therapeutic exercise;Stair training;Balance training;Patient/family education;Cognitive remediation   PT Goals (Current goals can be found in the Care Plan section) Acute Rehab PT  Goals Patient Stated Goal: none stated PT Goal Formulation: Patient unable to participate in goal setting Time For Goal Achievement: 05/18/15 Potential to Achieve Goals: Good    Frequency Min 3X/week   Barriers to discharge        Co-evaluation               End of Session Equipment Utilized During Treatment: Gait belt Activity Tolerance: Patient tolerated treatment well Patient left: in bed;with call bell/phone within reach;with bed alarm set Nurse Communication: Mobility status         Time: 0938-1829 PT Time Calculation (min) (ACUTE ONLY): 24 min   Charges:   PT Evaluation $PT Eval Moderate Complexity: 1 Procedure PT Treatments $Self Care/Home Management: 8-22   PT G CodesFabio Asa 2015-05-08, 5:05 PM  Charlotte Crumb, PT DPT  [phone]

## 2015-05-04 NOTE — Progress Notes (Signed)
  Echocardiogram 2D Echocardiogram has been performed.  Cathie Beams 05/04/2015, 12:57 PM

## 2015-05-05 DIAGNOSIS — H9193 Unspecified hearing loss, bilateral: Secondary | ICD-10-CM

## 2015-05-05 DIAGNOSIS — I5033 Acute on chronic diastolic (congestive) heart failure: Secondary | ICD-10-CM

## 2015-05-05 DIAGNOSIS — I519 Heart disease, unspecified: Secondary | ICD-10-CM | POA: Insufficient documentation

## 2015-05-05 DIAGNOSIS — H9190 Unspecified hearing loss, unspecified ear: Secondary | ICD-10-CM | POA: Insufficient documentation

## 2015-05-05 LAB — BASIC METABOLIC PANEL
Anion gap: 10 (ref 5–15)
BUN: 21 mg/dL — ABNORMAL HIGH (ref 6–20)
CHLORIDE: 85 mmol/L — AB (ref 101–111)
CO2: 27 mmol/L (ref 22–32)
CREATININE: 1.11 mg/dL (ref 0.61–1.24)
Calcium: 8.2 mg/dL — ABNORMAL LOW (ref 8.9–10.3)
GFR calc non Af Amer: 59 mL/min — ABNORMAL LOW (ref >60)
Glucose, Bld: 97 mg/dL (ref 65–99)
POTASSIUM: 4 mmol/L (ref 3.5–5.1)
Sodium: 122 mmol/L — ABNORMAL LOW (ref 135–145)

## 2015-05-05 LAB — GLUCOSE, CAPILLARY
GLUCOSE-CAPILLARY: 104 mg/dL — AB (ref 65–99)
GLUCOSE-CAPILLARY: 144 mg/dL — AB (ref 65–99)
GLUCOSE-CAPILLARY: 132 mg/dL — AB (ref 65–99)
Glucose-Capillary: 89 mg/dL (ref 65–99)

## 2015-05-05 MED ORDER — CARVEDILOL 6.25 MG PO TABS
6.2500 mg | ORAL_TABLET | Freq: Two times a day (BID) | ORAL | Status: DC
  Administered 2015-05-06 – 2015-05-11 (×11): 6.25 mg via ORAL
  Filled 2015-05-05 (×11): qty 1

## 2015-05-05 MED ORDER — CARVEDILOL 6.25 MG PO TABS
6.2500 mg | ORAL_TABLET | Freq: Two times a day (BID) | ORAL | Status: DC
  Administered 2015-05-05: 6.25 mg via ORAL
  Filled 2015-05-05: qty 1

## 2015-05-05 MED ORDER — INSULIN ASPART 100 UNIT/ML ~~LOC~~ SOLN
0.0000 [IU] | Freq: Three times a day (TID) | SUBCUTANEOUS | Status: DC
  Administered 2015-05-09 – 2015-05-11 (×2): 2 [IU] via SUBCUTANEOUS

## 2015-05-05 NOTE — Progress Notes (Signed)
Patient Name: Hunter Choi Date of Encounter: 05/05/2015  Primary Cardiologist: Dr. Katrinka Blazing   Principal Problem:   Acute on chronic heart failure [facility] Active Problems:   Rheumatoid arthritis (HCC)   GERD (gastroesophageal reflux disease)   Thyroid disease   BPH (benign prostatic hypertrophy)   AAA (abdominal aortic aneurysm) without rupture (HCC)   Arterial hypotension   Hyponatremia   Chest pain at rest   Hyperglycemia    SUBJECTIVE  Denies any SOB or CP, feeling good.   CURRENT MEDS . amLODipine  2.5 mg Oral Q1500  . aspirin EC  81 mg Oral Daily  . enoxaparin (LOVENOX) injection  30 mg Subcutaneous Q24H  . folic acid  3 mg Oral Daily  . furosemide  60 mg Intravenous BID  . furosemide  40 mg Oral Daily  . insulin aspart  0-15 Units Subcutaneous TID WC  . insulin aspart  0-5 Units Subcutaneous QHS  . levothyroxine  50 mcg Oral QAC breakfast  . lisinopril  2.5 mg Oral Daily  . methotrexate  25 mg Oral Weekly  . metoprolol tartrate  25 mg Oral BID  . pantoprazole  40 mg Oral Daily  . polyethylene glycol  17 g Oral BID  . simvastatin  20 mg Oral QPM  . sodium chloride flush  3 mL Intravenous Q12H    OBJECTIVE  Filed Vitals:   05/03/15 2206 05/04/15 0622 05/04/15 2212 05/05/15 0622  BP: 122/53 124/69 108/54   Pulse: 85 80 66   Temp: 98.6 F (37 C) 98 F (36.7 C) 98.8 F (37.1 C)   TempSrc: Oral Oral Oral   Resp: 17 16 16    Height:      Weight:  138 lb 9 oz (62.851 kg)  136 lb 1.6 oz (61.735 kg)  SpO2: 92% 96% 98%     Intake/Output Summary (Last 24 hours) at 05/05/15 0833 Last data filed at 05/05/15 0534  Gross per 24 hour  Intake    560 ml  Output   1301 ml  Net   -741 ml   Filed Weights   05/03/15 0534 05/04/15 0622 05/05/15 0622  Weight: 138 lb 9.6 oz (62.869 kg) 138 lb 9 oz (62.851 kg) 136 lb 1.6 oz (61.735 kg)    PHYSICAL EXAM  General: Pleasant, NAD. Neuro: Alert and oriented X 3. Moves all extremities spontaneously. Psych: Normal  affect. HEENT:  Normal  Neck: Supple without bruits or JVD. Lungs:  Resp regular and unlabored, CTA. Heart: RRR no s3, s4, or murmurs. Abdomen: Soft, non-tender, non-distended, BS + x 4.  Extremities: No clubbing, cyanosis or edema. DP/PT/Radials 2+ and equal bilaterally.  Accessory Clinical Findings  CBC No results for input(s): WBC, NEUTROABS, HGB, HCT, MCV, PLT in the last 72 hours. Basic Metabolic Panel  Recent Labs  05/04/15 0449 05/05/15 0435  NA 125* 122*  K 4.4 4.0  CL 88* 85*  CO2 28 27  GLUCOSE 93 97  BUN 18 21*  CREATININE 1.14 1.11  CALCIUM 8.3* 8.2*    TELE Sinus brady with incomplete LBBB    ECG  No new EKG  Echocardiogram 05/04/2015  LV EF: 20% -  25%  ------------------------------------------------------------------- Indications:   Chest pain 786.51.  ------------------------------------------------------------------- History:  PMH: TAVR. Aortic stenosis. Abdominal aortic aneurysm. Atrial fibrillation. Coronary artery disease. Stroke. Risk factors: Hypertension.  ------------------------------------------------------------------- Study Conclusions  - Procedure narrative: Transthoracic echocardiography. Image quality was adequate. The study was technically difficult. - Left ventricle: The cavity size was normal.  Wall thickness was increased in a pattern of mild LVH. Systolic function was severely reduced. The estimated ejection fraction was in the range of 20% to 25%. Thickened/prominent LV apical false tendon. Diffuse hypokinesis and regional variation. Doppler parameters are consistent with abnormal left ventricular relaxation (grade 1 diastolic dysfunction). The E/e&' ratio is >15, suggesting elevated LV filling pressure. - Aortic valve: S/p TAVR bioprosthetic valve. No significant obstruction. Transvalvular velocity was minimally increased. There was trivial regurgitation. - Mitral valve: Sclerotic  leaflets. Mild regurgitation. - Left atrium: Severely dilated at 56 ml/m2. - Right ventricle: The cavity size was mildly dilated. Systolic function is reduced. - Right atrium: The atrium was mildly dilated. - Inferior vena cava: The vessel was dilated. The respirophasic diameter changes were blunted (< 50%), consistent with elevated central venous pressure.  Impressions:  - Compared to a prior echo in 12/2014, the EF has further declined from 40-45% to 20-25% wtih diffuse hypokinesis and regional variation.    Radiology/Studies  Dg Chest 2 View  05/01/2015  CLINICAL DATA:  Chest pain and weakness for 2 days EXAM: CHEST  2 VIEW COMPARISON:  11/14/2014 FINDINGS: Previous median sternotomy and CABG procedure. Aortic atherosclerosis noted. There is mild cardiac enlargement. Bilateral pleural effusions are present in areas mild to moderate interstitial edema consistent with CHF. Atelectasis is noted in the lung bases. IMPRESSION: 1. Congestive heart failure. 2. Decreased aeration to the lung bases. Electronically Signed   By: Signa Kell M.D.   On: 05/01/2015 14:05    ASSESSMENT AND PLAN  1. Acute on chronic combined systolic and diastolic HF (EF 95-62%)  - discharged in Nov with daily Lasix therapy 20 mg. However they were confused and did not take this as a did not realize this was a daily medication.  - Echo 01/07/2015 EF 40-45%. Repeat echo 05/04/2015 EF 20-25%, grade 1 DD, mild LVH, mild MR.   - given sudden drop in EF, consider myoview, originally planned for discharge today, however given low Na and drop in ED, may want to obtain [facility] as inpatient. Will switch metoprolol 25mg  BID to coreg 6.25mg  BID, increase ACEI or even Entresto.  2. Elevated trop likely demand ischemia  3. CAD s/p CABG x4V (1999); s/p PCI dLAD (2004)  4. AS s/p TAVR (12/2012)  5. PAF (felt to be an isolated event and not on long term AC)  6. Hyponatremia: baseline Na mid 120s, would continue  diuretic, sodium 122 this morning, consider starting Tolvaptan, avoid raising Na too fast, per UpToDATE need to increase < 87meq/L per 24 hours to avoid central pontine myelinolysis which is irreversible.  (See my note below  .,.10mMarland KitchenMarland Kitchen.)  7. CVA w/ residual L eye blindness  8. HTN  9. HLD  Signed, Myrtis Ser PA-C Pager: Azalee Course Patient seen and examined. I agree with the assessment and plan as detailed above. See also my additional thoughts below.    The following PARAGRAPH is copied and pasted from the patient's discharge summary on February 03, 2015. Hyponatremia, subacute on chronic-likely SIADH. - Baseline sodium since 2012 has ranged between 125-135. Admitted with sodium of 119. - Etiology of his chronic hyponatremia is unclear. No prior urine or serum osmolarity results in chart. Patient is aware of his chronic hypo-natremia. Does not give any history of fluid restriction advice. - This admission, was empirically treated with gentle IV normal saline hydration and sodium had improved from 119 on 11/1 to 127 on 11/3 but since then again has decreased down to 120. -  Serum osmolarity: 252 and urine osmolarity 414. - Initiated fluid restriction 1200 mL per day and provided with Lasix 20 mg IV 2 doses on 11/6. Despite these measures and good diuresis, sodium persists at 120 albeit asymptomatic. - Nephrology consultation appreciated: Indicated that admission/preop low sodium was probably due to hypovolemic hyponatremia and improved on normal saline administration. Once he got euvolemic, SIADH resurfaced and worsened without fluid restriction. Continuing Lasix and follow BMP in a.m. - Sodium: 121 today. Nephrology follow-up appreciated. Discussed with Dr. York Grice to discharge home with repeat BMP every 2-3 days for about 10 days. Anticipate sodium will continue to improve and not too far from baseline. Continue Lasix 20 mg daily. Consider increasing his Synthroid as outpatient with  follow-up of TSH.  I spoke to the patient today about fluid intake. He tells me that he has been told in the past to drink as much fluid as he can. I explained to him that we want to decrease his overall fluid intake. I suspect this will help with his sodium concentration. I've chosen not to start Tolvaptan at this time.  Willa Rough, MD, [facility] 05/05/2015  11:28AM

## 2015-05-05 NOTE — Progress Notes (Signed)
[facility]                                                                              Patient Demographics  Hunter Choi, is a 82 y.o. male, DOB - 08-12-1927, EQA:[id number]  Admit date - 05/01/2015   Admitting Physician Edsel Petrin, DO  Outpatient Primary MD for the patient is Shamleffer, Landry Mellow, MD  LOS - 4   Chief Complaint  Patient presents with  . Shortness of Breath  . Chest Pain       Brief HPI   Hunter Choi is a very pleasant 82 y.o. male with a past medical history that includes CAD, hypertension, hyperlipidemia, chronic hyponatremia chronic thrombocytopenia chronic combined heart failure, bioprosthetic aortic valve presented to ED with dyspnea and chest pain. Patient's family reported reported a 3 day history of gradually worsening shortness of breath. Associated symptoms include intermittent left anterior chest pain. He denied any cough lower extremity edema or orthopnea. He denies palpitations diaphoresis nausea vomiting abdominal pain. He denies dysuria hematuria frequency or urgency. Family does indicate that he has not been taking his Lasix that was prescribed at last hospitalization as they did not understand it was a daily medication. In the emergency department he is afebrile hemodynamically stable and not hypoxic. He is given 40 mg of Lasix intravenously   Assessment & Plan    Principal Problem: Acute on chronic combined heart failure. Echo done in October 2016 mild LVH. EF 40-45% was diffuse hypokinesis, grade 1 diastolic dysfunction.  Likely related to the fact he's not been taking his Lasix due to misunderstanding at discharge BNP over 4500 - Continue IV Lasix, on 60 mg IV BID, per cardiology, transitioned to oral Lasix in a.m., outpatient follow-up with cardiology  - Negative balance of 2.0 L  - Continue ACE inhibitor and beta blocker   Chest pain. Some typical and atypical features. Heart score 4. Pain-free admission.  Initial troponin 0.05. EKG without acute changes. No chest pain at this time - Per cardiology likely due to demand ischemia in the setting of acute CHF, no further ischemic workup - 2-D echo showed EF of 20-25% with hypokinesis diffuse, grade 1 diastolic dysfunction, prior echo in 10/16 had shown EF of 40-45%.   Abdominal aortic aneurysm without rupture. He is surveilled every 6 months. Wife indicates he has an appointment Monday for checkup. Also has history of aortic valve disease, status post TAVR in 10/14, follows Dr. Verdis Prime - Wife to notify Dr. Edilia Bo  Hyponatremia. Chronic hyponatremia, has chronically low sodium - Sodium trended down to 122 today a.m. on IV diuresis. Patient alert and oriented - Discussed with cardiology, per cardiology, hold off on tolvaptan, placed on fluid restriction, will reassess Na , if still trending down, will consult nephrology inpatient. Cardiology discussed with Dr. Lacy Duverney today for outpatient follow-up. Lasix also decreased and transitioned to oral 40 mg daily - Serum osmolarity 282, urine osmolality 276   Rheumatoid arthritis. Stable at baseline -Continue home meds -Physical therapy  Hyperglycemia. -hemoglobin A1c 6.0  -Sliding scale insulin  Code Status: Full code  Family Communication: Discussed in detail with the patient,  all imaging results, lab results explained to the patient    Disposition Plan:  If sodium is improving in a.m., DC home, patient will need outpatient nephrology follow-up.  Time Spent in minutes 25 minutes  Procedures  none  Consults   none  DVT Prophylaxis  Lovenox   Medications  Scheduled Meds: . amLODipine  2.5 mg Oral Q1500  . aspirin EC  81 mg Oral Daily  . carvedilol  6.25 mg Oral BID WC  . enoxaparin (LOVENOX) injection  30 mg Subcutaneous Q24H  . folic acid  3 mg Oral Daily  . furosemide  40 mg Oral Daily  . insulin aspart  0-15 Units Subcutaneous TID WC  . insulin aspart  0-5 Units Subcutaneous  QHS  . levothyroxine  50 mcg Oral QAC breakfast  . lisinopril  2.5 mg Oral Daily  . methotrexate  25 mg Oral Weekly  . pantoprazole  40 mg Oral Daily  . polyethylene glycol  17 g Oral BID  . simvastatin  20 mg Oral QPM  . sodium chloride flush  3 mL Intravenous Q12H   Continuous Infusions:  PRN Meds:.sodium chloride, acetaminophen **OR** acetaminophen, ondansetron **OR** ondansetron (ZOFRAN) IV, sodium chloride flush   Antibiotics   Anti-infectives    None        Subjective:   Hunter Choi was seen and examined today. He denies any specific complaints no chest pain.. Patient denies dizziness,  shortness of breath, abdominal pain, N/V/D/C, new weakness, numbess, tingling. No acute events overnight.    Objective:   Blood pressure 143/53, pulse 74, temperature 98.6 F (37 C), temperature source Oral, resp. rate 16, height 5\' 7"  (1.702 m), weight 61.735 kg (136 lb 1.6 oz), SpO2 98 %.  Wt Readings from Last 3 Encounters:  05/05/15 61.735 kg (136 lb 1.6 oz)  02/03/15 60.2 kg (132 lb 11.5 oz)  01/14/15 68.493 kg (151 lb)     Intake/Output Summary (Last 24 hours) at 05/05/15 1233 Last data filed at 05/05/15 1010  Gross per 24 hour  Intake    680 ml  Output   1300 ml  Net   -620 ml    Exam  General: Alert and oriented x 3, NAD  HEENT:  PERRLA, EOMI   Neck: Supple, no JVD, no masses  CVS: S1 S2 auscultated, 1/6 systolic murmur   Respiratory: Decreased breath sounds at the bases  Abdomen: Soft, nontender, nondistended, + bowel sounds  Ext: no cyanosis clubbing or edema  Neuro:  no new deficits  skin: No rashes  Psych: Normal affect and demeanor, alert and oriented x3    Data Review   Micro Results No results found for this or any previous visit (from the past 240 hour(s)).  Radiology Reports Dg Chest 2 View  05/01/2015  CLINICAL DATA:  Chest pain and weakness for 2 days EXAM: CHEST  2 VIEW COMPARISON:  11/14/2014 FINDINGS: Previous median sternotomy and  CABG procedure. Aortic atherosclerosis noted. There is mild cardiac enlargement. Bilateral pleural effusions are present in areas mild to moderate interstitial edema consistent with CHF. Atelectasis is noted in the lung bases. IMPRESSION: 1. Congestive heart failure. 2. Decreased aeration to the lung bases. Electronically Signed   By: Signa Kell M.D.   On: 05/01/2015 14:05    CBC  Recent Labs Lab 05/01/15 1343  WBC 9.4  HGB 12.5*  HCT 39.2  PLT 172  MCV 93.6  MCH 29.8  MCHC 31.9  RDW 17.0*    Chemistries  Recent Labs Lab 05/01/15 1343 05/02/15 0443 05/03/15 0345 05/04/15 0449 05/05/15 0435  NA 132* 130* 126* 125* 122*  K 4.9 4.5 4.1 4.4 4.0  CL 96* 89* 88* 88* 85*  CO2 22 28 26 28 27   GLUCOSE 161* 89 90 93 97  BUN 17 16 17 18  21*  CREATININE 1.03 1.06 1.02 1.14 1.11  CALCIUM 9.0 9.2 8.2* 8.3* 8.2*   ------------------------------------------------------------------------------------------------------------------ estimated creatinine clearance is 42.5 mL/min (by C-G formula based on Cr of 1.11). ------------------------------------------------------------------------------------------------------------------ No results for input(s): HGBA1C in the last 72 hours. ------------------------------------------------------------------------------------------------------------------ No results for input(s): CHOL, HDL, LDLCALC, TRIG, CHOLHDL, LDLDIRECT in the last 72 hours. ------------------------------------------------------------------------------------------------------------------ No results for input(s): TSH, T4TOTAL, T3FREE, THYROIDAB in the last 72 hours.  Invalid input(s): FREET3 ------------------------------------------------------------------------------------------------------------------ No results for input(s): VITAMINB12, FOLATE, FERRITIN, TIBC, IRON, RETICCTPCT in the last 72 hours.  Coagulation profile No results for input(s): INR, PROTIME in the last  168 hours.  No results for input(s): DDIMER in the last 72 hours.  Cardiac Enzymes  Recent Labs Lab 05/01/15 2046 05/02/15 0443  TROPONINI 0.46* 0.74*   ------------------------------------------------------------------------------------------------------------------ Invalid input(s): POCBNP   Recent Labs  05/04/15 0659 05/04/15 1149 05/04/15 1644 05/04/15 2130 05/05/15 0649 05/05/15 1144  GLUCAP 84 107* 79 105* 89 104*     Francene Mcerlean M.D. [facility] 05/05/2015, 12:33 PM  Pager: [phone] Between 7am to 7pm - call Pager - [phone]  After 7pm go to [email or web address] - password [email or web address]  Call night coverage person covering after 7pm

## 2015-05-06 ENCOUNTER — Inpatient Hospital Stay (HOSPITAL_COMMUNITY): Payer: Medicare Other

## 2015-05-06 ENCOUNTER — Other Ambulatory Visit (HOSPITAL_COMMUNITY): Payer: Medicare Other

## 2015-05-06 ENCOUNTER — Ambulatory Visit: Payer: Medicare Other | Admitting: Vascular Surgery

## 2015-05-06 DIAGNOSIS — I519 Heart disease, unspecified: Secondary | ICD-10-CM

## 2015-05-06 DIAGNOSIS — I25810 Atherosclerosis of coronary artery bypass graft(s) without angina pectoris: Secondary | ICD-10-CM | POA: Insufficient documentation

## 2015-05-06 DIAGNOSIS — I25708 Atherosclerosis of coronary artery bypass graft(s), unspecified, with other forms of angina pectoris: Secondary | ICD-10-CM

## 2015-05-06 LAB — BASIC METABOLIC PANEL
Anion gap: 10 (ref 5–15)
BUN: 20 mg/dL (ref 6–20)
CALCIUM: 8.7 mg/dL — AB (ref 8.9–10.3)
CHLORIDE: 84 mmol/L — AB (ref 101–111)
CO2: 30 mmol/L (ref 22–32)
CREATININE: 1.14 mg/dL (ref 0.61–1.24)
GFR calc Af Amer: >60 mL/min (ref >60)
GFR calc non Af Amer: 57 mL/min — ABNORMAL LOW (ref >60)
GLUCOSE: 99 mg/dL (ref 65–99)
Potassium: 4.2 mmol/L (ref 3.5–5.1)
Sodium: 124 mmol/L — ABNORMAL LOW (ref 135–145)

## 2015-05-06 LAB — NM MYOCAR MULTI W/SPECT W/WALL MOTION / EF
CHL CUP MPHR: 135 {beats}/min
CHL CUP RESTING HR STRESS: 66 {beats}/min
CHL RATE OF PERCEIVED EXERTION: 0
CSEPEDS: 0 s
CSEPEW: 1 METS
CSEPPHR: 74 {beats}/min
Exercise duration (min): 0 min
Percent HR: 54 %

## 2015-05-06 LAB — GLUCOSE, CAPILLARY
GLUCOSE-CAPILLARY: 89 mg/dL (ref 65–99)
GLUCOSE-CAPILLARY: 108 mg/dL — AB (ref 65–99)
Glucose-Capillary: 99 mg/dL (ref 65–99)
Glucose-Capillary: 119 mg/dL — ABNORMAL HIGH (ref 65–99)

## 2015-05-06 LAB — CBC
HCT: 34.4 % — ABNORMAL LOW (ref 39.0–52.0)
Hemoglobin: 11.6 g/dL — ABNORMAL LOW (ref 13.0–17.0)
MCH: 30.4 pg (ref 26.0–34.0)
MCHC: 33.7 g/dL (ref 30.0–36.0)
MCV: 90.1 fL (ref 78.0–100.0)
PLATELETS: 142 10*3/uL — AB (ref 150–400)
RBC: 3.82 MIL/uL — ABNORMAL LOW (ref 4.22–5.81)
RDW: 16.4 % — ABNORMAL HIGH (ref 11.5–15.5)
WBC: 6.7 10*3/uL (ref 4.0–10.5)

## 2015-05-06 MED ORDER — TECHNETIUM TC 99M SESTAMIBI GENERIC - CARDIOLITE
10.0000 | Freq: Once | INTRAVENOUS | Status: AC | PRN
  Administered 2015-05-06: 10 via INTRAVENOUS

## 2015-05-06 MED ORDER — TECHNETIUM TC 99M SESTAMIBI GENERIC - CARDIOLITE
30.0000 | Freq: Once | INTRAVENOUS | Status: AC | PRN
  Administered 2015-05-06: 30 via INTRAVENOUS

## 2015-05-06 MED ORDER — REGADENOSON 0.4 MG/5ML IV SOLN
0.4000 mg | Freq: Once | INTRAVENOUS | Status: AC
  Administered 2015-05-06: 0.4 mg via INTRAVENOUS
  Filled 2015-05-06: qty 5

## 2015-05-06 MED ORDER — REGADENOSON 0.4 MG/5ML IV SOLN
INTRAVENOUS | Status: AC
  Administered 2015-05-06: 0.4 mg via INTRAVENOUS
  Filled 2015-05-06: qty 5

## 2015-05-06 NOTE — Progress Notes (Signed)
1 min, Vernona Rieger PA at bedside, pt restless in bed, pt nonverbal upon initial assessment in nuc me.

## 2015-05-06 NOTE — Progress Notes (Signed)
7 mins, Vernona Rieger PA at bedside, procedure ended. NAD

## 2015-05-06 NOTE — Progress Notes (Signed)
Subjective: No complaints-lethargic but does wake up.  Objective: Vital signs in last 24 hours: Temp:  [98 F (36.7 C)-98.6 F (37 C)] 98 F (36.7 C) (02/08 0513) Pulse Rate:  [70-74] 70 (02/08 0513) Resp:  [16] 16 (02/08 0513) BP: (132-143)/(53-67) 132/54 mmHg (02/08 0513) SpO2:  [98 %-100 %] 100 % (02/08 0513) Weight:  [136 lb 5 oz (61.83 kg)] 136 lb 5 oz (61.83 kg) (02/08 0513) Weight change: 3.4 oz (0.096 kg) Last BM Date: 05/05/15 Intake/Output from previous day: -325 02/07 0701 - 02/08 0700 In: 600 [P.O.:600] Out: 925 [Urine:925] Intake/Output this shift:    PE: General:Pleasant affect, NAD Skin:Warm and dry, brisk capillary refill HEENT:normocephalic, sclera clear, mucus membranes moist Heart:S1S2 RRR without murmur, gallup, rub or click Lungs:clear without rales, rhonchi, or wheezes ONG:EXBM, non tender, + BS, do not palpate liver spleen or masses Ext:no lower ext edema,  2+ radial pulses Neuro:lethargic MAE, follows commands, + facial symmetry Tele:  SR with PVCs   Lab Results:  Recent Labs  05/06/15 0615  WBC 6.7  HGB 11.6*  HCT 34.4*  PLT 142*   BMET  Recent Labs  05/05/15 0435 05/06/15 0615  NA 122* 124*  K 4.0 4.2  CL 85* 84*  CO2 27 30  GLUCOSE 97 99  BUN 21* 20  CREATININE 1.11 1.14  CALCIUM 8.2* 8.7*   No results for input(s): TROPONINI in the last 72 hours.  Invalid input(s): CK, MB  Lab Results  Component Value Date   CHOL 84 02/21/2014   HDL 27* 02/21/2014   LDLCALC 38 02/21/2014   TRIG 93 02/21/2014   CHOLHDL 3.1 02/21/2014   Lab Results  Component Value Date   HGBA1C 6.0* 05/01/2015     Lab Results  Component Value Date   TSH 3.335 05/01/2015      Studies/Results: No results found.  Medications: I have reviewed the patient's current medications. Scheduled Meds: . amLODipine  2.5 mg Oral Q1500  . aspirin EC  81 mg Oral Daily  . carvedilol  6.25 mg Oral BID WC  . enoxaparin (LOVENOX) injection   30 mg Subcutaneous Q24H  . folic acid  3 mg Oral Daily  . insulin aspart  0-15 Units Subcutaneous TID WC  . insulin aspart  0-5 Units Subcutaneous QHS  . levothyroxine  50 mcg Oral QAC breakfast  . lisinopril  2.5 mg Oral Daily  . methotrexate  25 mg Oral Weekly  . pantoprazole  40 mg Oral Daily  . polyethylene glycol  17 g Oral BID  . simvastatin  20 mg Oral QPM  . sodium chloride flush  3 mL Intravenous Q12H   Continuous Infusions:  PRN Meds:.sodium chloride, acetaminophen **OR** acetaminophen, ondansetron **OR** ondansetron (ZOFRAN) IV, sodium chloride flush  Assessment/Plan: Principal Problem:   Acute on chronic heart failure (HCC) Active Problems:   Rheumatoid arthritis (HCC)   GERD (gastroesophageal reflux disease)   Thyroid disease   BPH (benign prostatic hypertrophy)   AAA (abdominal aortic aneurysm) without rupture (HCC)   Arterial hypotension   Hyponatremia   Chest pain at rest   Hyperglycemia   Hard of hearing   LV dysfunction  1. Acute on chronic combined systolic and diastolic HF (EF 84-13%) - discharged in Nov with daily Lasix therapy 20 mg. However they were confused and did not take this as a did not realize this was a daily medication. - Echo 01/07/2015 EF 40-45%. Repeat echo 05/04/2015 EF 20-25%,  grade 1 DD, mild LVH, mild MR.  - given sudden drop in EF, consider myoview, originally planned for discharge yesterday, however given low Na and drop in ED,we are obtaining myoview as inpatient. Will switch metoprolol 25mg  BID to coreg 6.25mg  BID, increase ACEI or even Entresto.  2. Elevated trop likely demand ischemia  3. CAD s/p CABG x4V (1999); s/p PCI dLAD (2004)  4. AS s/p TAVR (12/2012)  5. PAF (felt to be an isolated event and not on long term AC)  6. Hyponatremia: baseline Na mid 120s, would continue diuretic, sodium 122 this morning, consider starting Tolvaptan, avoid raising Na too fast, per UpToDATE need to increase  < 42meq/L per 24 hours to avoid central pontine myelinolysis which is irreversible. (See my note below .,.10mMarland KitchenMarland Kitchen.)  7. CVA w/ residual L eye blindness  8. HTN  9. HLD  LOS: 5 days   Time spent with pt. :15 minutes. [facility]  Nurse Practitioner Certified Pager [phone] or after 5pm and on weekends call [phone] 05/06/2015, 9:22 AM   Patient seen and examined and history reviewed. Agree with above findings and plan. Patient currently eating dinner. States breathing is doing "real good". Only chest pain when he takes a real deep breath. Myoview results reviewed. This shows an apical infarct and lateral wall ischemia. EF 28%. Explained results to patient. This study suggests that ischemia may be playing a role in worsening LV function. Discussed possibility of repeat cardiac cath. Last cardiac cath in 2014 prior to TAVR showed occluded SVG to RCA. Other grafts were all patent. Will keep NPO tonight. Patient to think about it tonight and team will discuss with him in am.  Hunter Choi 2015, MDFACC 05/06/2015 6:15 PM

## 2015-05-06 NOTE — Progress Notes (Signed)
5 mins, Vernona Rieger PA at bedside, verbal order for Aminophylline per Vernona Rieger PA due to pt unable to verbalize their symptoms,

## 2015-05-06 NOTE — Progress Notes (Signed)
3 min, Vernona Rieger PA at bedside, pt hypotensive, 100 cc NS given per verbal order from [place] PA at bedside, pt no longer restless in bed, pt resting

## 2015-05-06 NOTE — Progress Notes (Signed)
This RN spoke with patients son; [name], via telephone to give [place] a verbal consent to move forward and perform ordered stress test. Nedra Hai CCT verified verbal order with this RN. Please see consent form.     Victorino Dike RN

## 2015-05-06 NOTE — Progress Notes (Signed)
[facility]                                                                              Patient Demographics  Hunter Choi, is a 82 y.o. male, DOB - [birth year], VXY:[id number]  Admit date - 05/01/2015   Admitting Physician Edsel Petrin, DO  Outpatient Primary MD for the patient is Shamleffer, Landry Mellow, MD  LOS - 5   Chief Complaint  Patient presents with  . Shortness of Breath  . Chest Pain       Brief HPI   Hunter Choi is a very pleasant 82 y.o. male with a past medical history that includes CAD, hypertension, hyperlipidemia, chronic hyponatremia chronic thrombocytopenia chronic combined heart failure, bioprosthetic aortic valve presented to ED with dyspnea and chest pain. Patient's family reported reported a 3 day history of gradually worsening shortness of breath. Associated symptoms include intermittent left anterior chest pain. He denied any cough lower extremity edema or orthopnea. He denies palpitations diaphoresis nausea vomiting abdominal pain. He denies dysuria hematuria frequency or urgency. Family does indicate that he has not been taking his Lasix that was prescribed at last hospitalization as they did not understand it was a daily medication. In the emergency department he is afebrile hemodynamically stable and not hypoxic. He is given 40 mg of Lasix intravenously   Assessment & Plan    Principal Problem: Acute on chronic combined heart failure. Echo done in October 2016 mild LVH. EF 40-45% was diffuse hypokinesis, grade 1 diastolic dysfunction.  Likely related to the fact he's not been taking his Lasix due to misunderstanding at discharge BNP over 4500 - Patient was placed on IV Lasix.  Negative balance of 2.6 L  - Continue ACE inhibitor and beta blocker -2-D echo showed EF of 20-25%, dropped from prior echo in 10/16, EF was 40-45%, stress test today.   Chest pain. Some typical and atypical features. Heart score 4. Pain-free admission.  Initial troponin 0.05. EKG without acute changes. No chest pain at this time - Per cardiology likely due to demand ischemia in the setting of acute CHF, no further ischemic workup - 2-D echo showed EF of 20-25% with hypokinesis diffuse, grade 1 diastolic dysfunction, prior echo in 10/16 had shown EF of 40-45%. - stress test today   Abdominal aortic aneurysm without rupture. He is surveilled every 6 months. Wife indicates he has an appointment Monday for checkup. Also has history of aortic valve disease, status post TAVR in 10/14, follows Dr. Verdis Prime - Wife to notify Dr. Edilia Bo  Hyponatremia. Chronic hyponatremia, has chronically low sodium - Sodium trending up to 124 today, on diuresis. Patient alert and oriented - Cont fluid restriction, lasix currently on hold  - Serum osmolarity 282, urine osmolality 276   Rheumatoid arthritis. Stable at baseline -Continue home meds -Physical therapy-> HPT  Hyperglycemia. -hemoglobin A1c 6.0  -Sliding scale insulin  Code Status: Full code  Family Communication: Discussed in detail with the patient, all imaging results, lab results explained to the patient    Disposition Plan: stress test today   Time Spent in minutes 25 minutes  Procedures  none  Consults   cardiology  DVT Prophylaxis  Lovenox   Medications  Scheduled Meds: . amLODipine  2.5 mg Oral Q1500  . aspirin EC  81 mg Oral Daily  . carvedilol  6.25 mg Oral BID WC  . enoxaparin (LOVENOX) injection  30 mg Subcutaneous Q24H  . folic acid  3 mg Oral Daily  . insulin aspart  0-15 Units Subcutaneous TID WC  . insulin aspart  0-5 Units Subcutaneous QHS  . levothyroxine  50 mcg Oral QAC breakfast  . lisinopril  2.5 mg Oral Daily  . methotrexate  25 mg Oral Weekly  . pantoprazole  40 mg Oral Daily  . polyethylene glycol  17 g Oral BID  . simvastatin  20 mg Oral QPM  . sodium chloride flush  3 mL Intravenous Q12H   Continuous Infusions:  PRN Meds:.sodium chloride,  acetaminophen **OR** acetaminophen, ondansetron **OR** ondansetron (ZOFRAN) IV, sodium chloride flush   Antibiotics   Anti-infectives    None        Subjective:   Hunter Choi was seen and examined today. No chest pain. Patient seen after the stress test. Patient denies dizziness,  shortness of breath, abdominal pain, N/V/D/C, new weakness, numbess, tingling. No acute events overnight.    Objective:   Blood pressure 111/61, pulse 63, temperature 98.1 F (36.7 C), temperature source Oral, resp. rate 16, height 5\' 7"  (1.702 m), weight 61.83 kg (136 lb 5 oz), SpO2 100 %.  Wt Readings from Last 3 Encounters:  05/06/15 61.83 kg (136 lb 5 oz)  02/03/15 60.2 kg (132 lb 11.5 oz)  01/14/15 68.493 kg (151 lb)     Intake/Output Summary (Last 24 hours) at 05/06/15 1316 Last data filed at 05/06/15 0839  Gross per 24 hour  Intake    360 ml  Output    925 ml  Net   -565 ml    Exam  General: Alert and oriented x 3, NAD  HEENT:  PERRLA, EOMI   Neck: Supple, no JVD, no masses  CVS: S1 S2 auscultated, 1/6 systolic murmur   Respiratory: Decreased breath sounds at the bases  Abdomen: Soft, NT, ND, NBS  Ext: no cyanosis clubbing or edema  Neuro:  no new deficits  skin: No rashes  Psych: Normal affect and demeanor, alert and oriented x3    Data Review   Micro Results No results found for this or any previous visit (from the past 240 hour(s)).  Radiology Reports Dg Chest 2 View  05/01/2015  CLINICAL DATA:  Chest pain and weakness for 2 days EXAM: CHEST  2 VIEW COMPARISON:  11/14/2014 FINDINGS: Previous median sternotomy and CABG procedure. Aortic atherosclerosis noted. There is mild cardiac enlargement. Bilateral pleural effusions are present in areas mild to moderate interstitial edema consistent with CHF. Atelectasis is noted in the lung bases. IMPRESSION: 1. Congestive heart failure. 2. Decreased aeration to the lung bases. Electronically Signed   By: 11/16/2014 M.D.    On: 05/01/2015 14:05    CBC  Recent Labs Lab 05/01/15 1343 05/06/15 0615  WBC 9.4 6.7  HGB 12.5* 11.6*  HCT 39.2 34.4*  PLT 172 142*  MCV 93.6 90.1  MCH 29.8 30.4  MCHC 31.9 33.7  RDW 17.0* 16.4*    Chemistries   Recent Labs Lab 05/02/15 0443 05/03/15 0345 05/04/15 0449 05/05/15 0435 05/06/15 0615  NA 130* 126* 125* 122* 124*  K 4.5 4.1 4.4 4.0 4.2  CL 89* 88* 88* 85* 84*  CO2 28 26 28  27 30  GLUCOSE 89 90 93 97 99  BUN 16 17 18  21* 20  CREATININE 1.06 1.02 1.14 1.11 1.14  CALCIUM 9.2 8.2* 8.3* 8.2* 8.7*   ------------------------------------------------------------------------------------------------------------------ estimated creatinine clearance is 41.4 mL/min (by C-G formula based on Cr of 1.14). ------------------------------------------------------------------------------------------------------------------ No results for input(s): HGBA1C in the last 72 hours. ------------------------------------------------------------------------------------------------------------------ No results for input(s): CHOL, HDL, LDLCALC, TRIG, CHOLHDL, LDLDIRECT in the last 72 hours. ------------------------------------------------------------------------------------------------------------------ No results for input(s): TSH, T4TOTAL, T3FREE, THYROIDAB in the last 72 hours.  Invalid input(s): FREET3 ------------------------------------------------------------------------------------------------------------------ No results for input(s): VITAMINB12, FOLATE, FERRITIN, TIBC, IRON, RETICCTPCT in the last 72 hours.  Coagulation profile No results for input(s): INR, PROTIME in the last 168 hours.  No results for input(s): DDIMER in the last 72 hours.  Cardiac Enzymes  Recent Labs Lab 05/01/15 2046 05/02/15 0443  TROPONINI 0.46* 0.74*   ------------------------------------------------------------------------------------------------------------------ Invalid input(s):  POCBNP   Recent Labs  05/05/15 0649 05/05/15 1144 05/05/15 1626 05/05/15 2146 05/06/15 0641 05/06/15 1131  GLUCAP 89 104* 144* 132* 89 99     Hunter Choi M.D. [facility] 05/06/2015, 1:16 PM  Pager: [phone] Between 7am to 7pm - call Pager - [phone]  After 7pm go to [email or web address] - password [email or web address]  Call night coverage person covering after 7pm

## 2015-05-06 NOTE — Progress Notes (Signed)
Pt completed lexiscan myoview stress portion, nuc results to follow.  Was hypotensive gave 100cc NS and aminophylline.  Son gave permission for study.

## 2015-05-06 NOTE — Care Management Note (Signed)
Case Management Note  Patient Details  Name: Hunter Choi MRN: [id number] Date of Birth: 05-07-1927  Subjective/Objective:   Admitted with CHF                 Action/Plan: Patient lives at home with his spouse and adult son. His wife recently had a stroke but patient states that she is fairly active. His son takes him to his appointments and other activities. Patient ( very HOH) could benefit from [facility] services, patient stated " I do not want it right now because I have had it and I am finished with it, maybe in about 3 months I will do it again but I need a break." CM informed patient that if he wanted [facility] services after discharge to call his primary care physician and the arrangements can be made from the office.  Expected Discharge Date:    possibly 05/07/2015              Expected Discharge Plan:  Home/Self Care  Discharge planning Services  CM Consult   Choice offered to:  Patient  HH Arranged:  Patient Refused   Status of Service:  Completed, signed off  Medicare Important Message Given:  Yes  Reola Mosher [phone] 05/06/2015, 10:59 AM

## 2015-05-06 NOTE — Progress Notes (Signed)
Physical Therapy Treatment Patient Details Name: Hunter Choi MRN: [id number] DOB: Nov 16, 1927 Today's Date: 05/06/2015    History of Present Illness 82 year old male with a history of coronary artery disease, combined congestive heart failure, that presented to the emergency department with complaints of shortness of breath and chest pain    PT Comments    Progressing towards goals, still demonstrating some difficulty with cognitive commands and therefore recommend 24/7 supervision when pt returns home. Fatigues easily with short distance ambulation, up to 60 feet today, using a rolling walker for support. Requires assist for balance an walker control. Patient will continue to benefit from skilled physical therapy services to further improve independence with functional mobility.   Follow Up Recommendations  Home health PT;Supervision for mobility/OOB;Supervision/Assistance - 24 hour (if unable to provide may need ST SNF)     Equipment Recommendations  Other (comment) (TBD)    Recommendations for Other Services       Precautions / Restrictions Precautions Precautions: Fall Restrictions Weight Bearing Restrictions: No    Mobility  Bed Mobility Overal bed mobility: Needs Assistance Bed Mobility: Supine to Sit;Sit to Supine     Supine to sit: Min guard Sit to supine: Min guard   General bed mobility comments: Min guard for safety. VC for technique and to assist with foley cath.  Transfers Overall transfer level: Needs assistance Equipment used: Rolling walker (2 wheeled) Transfers: Sit to/from Stand Sit to Stand: Min assist         General transfer comment: Min assist for boost and balance to rise to standing. VC for hand placement.  Ambulation/Gait Ambulation/Gait assistance: Min assist Ambulation Distance (Feet): 60 Feet Assistive device: Rolling walker (2 wheeled) Gait Pattern/deviations: Step-through pattern;Decreased stride length;Decreased step length -  right;Drifts right/left;Trunk flexed (RLE externally rotated) Gait velocity: decreaed Gait velocity interpretation: Below normal speed for age/gender General Gait Details: Intermittent assistance for walker control due to drifting into objects in room/hall. VC for awareness and safety. Cues for upright posture and to alert for avoidance of objects in hallway but with little correction.   Stairs            Wheelchair Mobility    Modified Rankin (Stroke Patients Only)       Balance                                    Cognition Arousal/Alertness: Awake/alert Behavior During Therapy: Impulsive Overall Cognitive Status: No family/caregiver present to determine baseline cognitive functioning Area of Impairment: Orientation;Following commands;Safety/judgement;Awareness;Problem solving Orientation Level: Disoriented to;Situation   Memory: Decreased short-term memory Following Commands: Follows one step commands with increased time Safety/Judgement: Decreased awareness of safety;Decreased awareness of deficits Awareness: Intellectual Problem Solving: Slow processing;Requires verbal cues;Requires tactile cues;Difficulty sequencing      Exercises General Exercises - Lower Extremity Ankle Circles/Pumps: AROM;Both;10 reps;Supine Quad Sets: Strengthening;Both;10 reps;Supine Hip ABduction/ADduction: Strengthening;Both;10 reps;Supine Hip Flexion/Marching: Strengthening;Both;10 reps;Supine    General Comments        Pertinent Vitals/Pain Pain Assessment: No/denies pain    Home Living                      Prior Function            PT Goals (current goals can now be found in the care plan section) Acute Rehab PT Goals Patient Stated Goal: none stated PT Goal Formulation: Patient unable to participate in goal setting Time  For Goal Achievement: 05/18/15 Potential to Achieve Goals: Good Progress towards PT goals: Progressing toward goals     Frequency  Min 3X/week    PT Plan Current plan remains appropriate    Co-evaluation             End of Session Equipment Utilized During Treatment: Gait belt Activity Tolerance: Patient tolerated treatment well Patient left: in bed;with call bell/phone within reach;with bed alarm set     Time: 6734-1937 PT Time Calculation (min) (ACUTE ONLY): 10 min  Charges:  $Gait Training: 8-22 mins                    G Codes:      Hunter Choi 05-14-2015, 3:22 PM  Hunter Choi,  [phone]

## 2015-05-07 ENCOUNTER — Other Ambulatory Visit: Payer: Self-pay | Admitting: Interventional Cardiology

## 2015-05-07 ENCOUNTER — Encounter (HOSPITAL_COMMUNITY): Payer: Self-pay | Admitting: Interventional Cardiology

## 2015-05-07 ENCOUNTER — Encounter (HOSPITAL_COMMUNITY)
Admission: EM | Disposition: A | Discharge status: Home / Self Care | Payer: Self-pay | Source: Home / Self Care | Attending: Internal Medicine

## 2015-05-07 DIAGNOSIS — E871 Hypo-osmolality and hyponatremia: Secondary | ICD-10-CM

## 2015-05-07 DIAGNOSIS — I25810 Atherosclerosis of coronary artery bypass graft(s) without angina pectoris: Secondary | ICD-10-CM

## 2015-05-07 HISTORY — PX: CARDIAC CATHETERIZATION: SHX172

## 2015-05-07 LAB — CBC
HEMATOCRIT: 36.2 % — AB (ref 39.0–52.0)
HEMOGLOBIN: 12 g/dL — AB (ref 13.0–17.0)
MCH: 29.4 pg (ref 26.0–34.0)
MCHC: 33.1 g/dL (ref 30.0–36.0)
MCV: 88.7 fL (ref 78.0–100.0)
Platelets: 141 10*3/uL — ABNORMAL LOW (ref 150–400)
RBC: 4.08 MIL/uL — ABNORMAL LOW (ref 4.22–5.81)
RDW: 16.2 % — AB (ref 11.5–15.5)
WBC: 8.2 10*3/uL (ref 4.0–10.5)

## 2015-05-07 LAB — BASIC METABOLIC PANEL
ANION GAP: 8 (ref 5–15)
BUN: 26 mg/dL — ABNORMAL HIGH (ref 6–20)
CALCIUM: 8.3 mg/dL — AB (ref 8.9–10.3)
CO2: 28 mmol/L (ref 22–32)
Chloride: 85 mmol/L — ABNORMAL LOW (ref 101–111)
Creatinine, Ser: 1.19 mg/dL (ref 0.61–1.24)
GFR, EST NON AFRICAN AMERICAN: 54 mL/min — AB (ref >60)
Glucose, Bld: 94 mg/dL (ref 65–99)
Potassium: 4.1 mmol/L (ref 3.5–5.1)
Sodium: 121 mmol/L — ABNORMAL LOW (ref 135–145)

## 2015-05-07 LAB — PROTIME-INR
INR: 1.14 (ref 0.00–1.49)
Prothrombin Time: 14.8 seconds (ref 11.6–15.2)

## 2015-05-07 LAB — OSMOLALITY: Osmolality: 259 mOsm/kg — ABNORMAL LOW (ref 275–295)

## 2015-05-07 LAB — CREATININE, SERUM: CREATININE: 0.99 mg/dL (ref 0.61–1.24)

## 2015-05-07 LAB — CREATININE, URINE, RANDOM: Creatinine, Urine: 86.15 mg/dL

## 2015-05-07 LAB — RENAL FUNCTION PANEL
ANION GAP: 11 (ref 5–15)
Albumin: 3.1 g/dL — ABNORMAL LOW (ref 3.5–5.0)
BUN: 21 mg/dL — ABNORMAL HIGH (ref 6–20)
CALCIUM: 8.5 mg/dL — AB (ref 8.9–10.3)
CO2: 26 mmol/L (ref 22–32)
CREATININE: 0.98 mg/dL (ref 0.61–1.24)
Chloride: 86 mmol/L — ABNORMAL LOW (ref 101–111)
Glucose, Bld: 104 mg/dL — ABNORMAL HIGH (ref 65–99)
PHOSPHORUS: 3.7 mg/dL (ref 2.5–4.6)
Potassium: 4.8 mmol/L (ref 3.5–5.1)
SODIUM: 123 mmol/L — AB (ref 135–145)

## 2015-05-07 LAB — GLUCOSE, CAPILLARY
GLUCOSE-CAPILLARY: 102 mg/dL — AB (ref 65–99)
GLUCOSE-CAPILLARY: 84 mg/dL (ref 65–99)
GLUCOSE-CAPILLARY: 110 mg/dL — AB (ref 65–99)
Glucose-Capillary: 97 mg/dL (ref 65–99)

## 2015-05-07 LAB — OSMOLALITY, URINE: Osmolality, Ur: 492 mOsm/kg (ref 300–900)

## 2015-05-07 LAB — SODIUM, URINE, RANDOM: Sodium, Ur: 34 mmol/L

## 2015-05-07 LAB — CORTISOL: Cortisol, Plasma: 18.8 ug/dL

## 2015-05-07 LAB — TSH: TSH: 3.05 u[IU]/mL (ref 0.350–4.500)

## 2015-05-07 SURGERY — RIGHT HEART CATH AND CORONARY/GRAFT ANGIOGRAPHY

## 2015-05-07 MED ORDER — SODIUM CHLORIDE 0.9% FLUSH: 3.0000 mL | Freq: Two times a day (BID) | INTRAVENOUS | Status: DC

## 2015-05-07 MED ORDER — SODIUM CHLORIDE 0.9% FLUSH: 3.0000 mL | INTRAVENOUS | Status: DC | PRN

## 2015-05-07 MED ORDER — ONDANSETRON HCL 4 MG/2ML IJ SOLN: 4.0000 mg | Freq: Four times a day (QID) | INTRAMUSCULAR | Status: DC | PRN

## 2015-05-07 MED ORDER — LIDOCAINE HCL (PF) 1 % IJ SOLN
INTRAMUSCULAR | Status: DC | PRN
  Administered 2015-05-07: 14:00:00

## 2015-05-07 MED ORDER — ACETAMINOPHEN 325 MG PO TABS
650.0000 mg | ORAL_TABLET | ORAL | Status: DC | PRN
  Administered 2015-05-10: 650 mg via ORAL

## 2015-05-07 MED ORDER — SODIUM CHLORIDE 0.9 % IV SOLN: 250.0000 mL | INTRAVENOUS | Status: DC | PRN

## 2015-05-07 MED ORDER — SODIUM CHLORIDE 0.9 % IV SOLN
INTRAVENOUS | Status: DC
  Administered 2015-05-07 (×2): via INTRAVENOUS

## 2015-05-07 MED ORDER — MIDAZOLAM HCL 2 MG/2ML IJ SOLN
INTRAMUSCULAR | Status: AC
  Filled 2015-05-07: qty 2

## 2015-05-07 MED ORDER — SODIUM CHLORIDE 0.9% FLUSH
3.0000 mL | Freq: Two times a day (BID) | INTRAVENOUS | Status: DC
  Administered 2015-05-07 – 2015-05-11 (×8): 3 mL via INTRAVENOUS

## 2015-05-07 MED ORDER — HEPARIN (PORCINE) IN NACL 2-0.9 UNIT/ML-% IJ SOLN
INTRAMUSCULAR | Status: AC
  Filled 2015-05-07: qty 1000

## 2015-05-07 MED ORDER — IOHEXOL 350 MG/ML SOLN
INTRAVENOUS | Status: DC | PRN
  Administered 2015-05-07: 90 mL via INTRA_ARTERIAL

## 2015-05-07 MED ORDER — OXYCODONE-ACETAMINOPHEN 5-325 MG PO TABS: 1.0000 | ORAL_TABLET | ORAL | Status: DC | PRN

## 2015-05-07 MED ORDER — SODIUM CHLORIDE 0.9 % WEIGHT BASED INFUSION: 1.0000 mL/kg/h | INTRAVENOUS | Status: AC

## 2015-05-07 MED ORDER — FENTANYL CITRATE (PF) 100 MCG/2ML IJ SOLN
INTRAMUSCULAR | Status: DC | PRN
  Administered 2015-05-07: 50 ug via INTRAVENOUS

## 2015-05-07 MED ORDER — FENTANYL CITRATE (PF) 100 MCG/2ML IJ SOLN
INTRAMUSCULAR | Status: AC
  Filled 2015-05-07: qty 2

## 2015-05-07 MED ORDER — ENOXAPARIN SODIUM 30 MG/0.3ML ~~LOC~~ SOLN
30.0000 mg | SUBCUTANEOUS | Status: DC
  Administered 2015-05-08 – 2015-05-10 (×3): 30 mg via SUBCUTANEOUS
  Filled 2015-05-07 (×3): qty 0.3

## 2015-05-07 MED ORDER — LIDOCAINE HCL (PF) 1 % IJ SOLN
INTRAMUSCULAR | Status: AC
  Filled 2015-05-07: qty 30

## 2015-05-07 MED ORDER — ASPIRIN 81 MG PO CHEW
81.0000 mg | CHEWABLE_TABLET | ORAL | Status: AC
  Administered 2015-05-07: 81 mg via ORAL

## 2015-05-07 MED ORDER — MIDAZOLAM HCL 2 MG/2ML IJ SOLN
INTRAMUSCULAR | Status: DC | PRN
  Administered 2015-05-07 (×2): 1 mg via INTRAVENOUS

## 2015-05-07 SURGICAL SUPPLY — 17 items
CATH INFINITI 5 FR IM (CATHETERS) ×3 IMPLANT
CATH INFINITI 5 FR MPA2 (CATHETERS) ×3 IMPLANT
CATH INFINITI 5FR JL4 (CATHETERS) ×3 IMPLANT
CATH INFINITI JR4 5F (CATHETERS) ×3 IMPLANT
CATH SITESEER 5F MULTI A 2 (CATHETERS) ×3 IMPLANT
CATH SWAN GANZ 7F STRAIGHT (CATHETERS) ×3 IMPLANT
KIT HEART LEFT (KITS) ×3 IMPLANT
PACK CARDIAC CATHETERIZATION (CUSTOM PROCEDURE TRAY) ×3 IMPLANT
PINNACLE LONG 5F 25CM (SHEATH) ×3
SHEATH INTROD PINNACLE 5F 25CM (SHEATH) ×1 IMPLANT
SHEATH PINNACLE 5F 10CM (SHEATH) ×3 IMPLANT
SHEATH PINNACLE 7F 10CM (SHEATH) ×3 IMPLANT
TRANSDUCER W/STOPCOCK (MISCELLANEOUS) ×3 IMPLANT
WIRE EMERALD 3MM-J .025X260CM (WIRE) ×3 IMPLANT
WIRE EMERALD 3MM-J .035X150CM (WIRE) ×3 IMPLANT
WIRE EMERALD 3MM-J .035X260CM (WIRE) ×3 IMPLANT
WIRE HI TORQ VERSACORE-J 145CM (WIRE) ×3 IMPLANT

## 2015-05-07 NOTE — Care Management Important Message (Signed)
Important Message  Patient Details  Name: Hunter Choi MRN: [id number] Date of Birth: [birth year]   Medicare Important Message Given:  Yes    Etienne Mowers, Stephan Minister 05/07/2015, 8:25 AM

## 2015-05-07 NOTE — H&P (View-Only) (Signed)
Patient Name: Hunter Choi Date of Encounter: 05/07/2015     Principal Problem:   Acute on chronic heart failure (HCC) Active Problems:   Rheumatoid arthritis (HCC)   GERD (gastroesophageal reflux disease)   Thyroid disease   BPH (benign prostatic hypertrophy)   AAA (abdominal aortic aneurysm) without rupture (HCC)   Arterial hypotension   Hyponatremia   Chest pain at rest   Hyperglycemia   Hard of hearing   LV dysfunction   Coronary artery disease involving coronary bypass graft of native heart    SUBJECTIVE  No chest pain. Feeling good. Sleepy this AM. Agreeable to do cath   CURRENT MEDS . amLODipine  2.5 mg Oral Q1500  . aspirin EC  81 mg Oral Daily  . carvedilol  6.25 mg Oral BID WC  . enoxaparin (LOVENOX) injection  30 mg Subcutaneous Q24H  . folic acid  3 mg Oral Daily  . insulin aspart  0-15 Units Subcutaneous TID WC  . insulin aspart  0-5 Units Subcutaneous QHS  . levothyroxine  50 mcg Oral QAC breakfast  . lisinopril  2.5 mg Oral Daily  . methotrexate  25 mg Oral Weekly  . pantoprazole  40 mg Oral Daily  . polyethylene glycol  17 g Oral BID  . simvastatin  20 mg Oral QPM  . sodium chloride flush  3 mL Intravenous Q12H    OBJECTIVE  Filed Vitals:   05/06/15 0937 05/06/15 1132 05/06/15 2249 05/07/15 0359  BP: 101/45 111/61 113/63 123/57  Pulse: 69 63 112 61  Temp:  98.1 F (36.7 C) 97.8 F (36.6 C) 98.1 F (36.7 C)  TempSrc:  Oral Oral Oral  Resp:  16 16 16   Height:      Weight:      SpO2:  100% 100% 100%    Intake/Output Summary (Last 24 hours) at 05/07/15 0633 Last data filed at 05/07/15 0234  Gross per 24 hour  Intake    940 ml  Output    500 ml  Net    440 ml   Filed Weights   05/04/15 0622 05/05/15 0622 05/06/15 0513  Weight: 138 lb 9 oz (62.851 kg) 136 lb 1.6 oz (61.735 kg) 136 lb 5 oz (61.83 kg)    PHYSICAL EXAM  General: Pleasant, NAD. Lethargic  Neuro: Alert and oriented X 3. Moves all extremities spontaneously. Psych:  Normal affect. HEENT:  Normal  Neck: Supple without bruits or JVD. Lungs:  Resp regular and unlabored, CTA. Heart: RRR no s3, s4, or murmurs. Abdomen: Soft, non-tender, non-distended, BS + x 4.  Extremities: No clubbing, cyanosis or edema. DP/PT/Radials 2+ and equal bilaterally.  Accessory Clinical Findings  CBC  Recent Labs  05/06/15 0615  WBC 6.7  HGB 11.6*  HCT 34.4*  MCV 90.1  PLT 142*   Basic Metabolic Panel  Recent Labs  05/05/15 0435 05/06/15 0615  NA 122* 124*  K 4.0 4.2  CL 85* 84*  CO2 27 30  GLUCOSE 97 99  BUN 21* 20  CREATININE 1.11 1.14  CALCIUM 8.2* 8.7*    TELE  NSR w/ PVCs. 4 beats NSVT  Radiology/Studies  Dg Chest 2 View  05/01/2015  CLINICAL DATA:  Chest pain and weakness for 2 days EXAM: CHEST  2 VIEW COMPARISON:  11/14/2014 FINDINGS: Previous median sternotomy and CABG procedure. Aortic atherosclerosis noted. There is mild cardiac enlargement. Bilateral pleural effusions are present in areas mild to moderate interstitial edema consistent with CHF. Atelectasis is noted in  the lung bases. IMPRESSION: 1. Congestive heart failure. 2. Decreased aeration to the lung bases. Electronically Signed   By: Signa Kell M.D.   On: 05/01/2015 14:05   Nm Myocar Multi W/spect W/wall Motion / Ef  05/06/2015  CLINICAL DATA:  82 year old male with history short of breath, coronary artery disease. CABG in 1999. Stenting 2004. EXAM: MYOCARDIAL IMAGING WITH SPECT (REST AND PHARMACOLOGIC-STRESS) GATED LEFT VENTRICULAR WALL MOTION STUDY LEFT VENTRICULAR EJECTION FRACTION TECHNIQUE: Standard myocardial SPECT imaging was performed after resting intravenous injection of bowel mCi Tc-53m sestamibi. Subsequently, intravenous infusion of Lexiscan was performed under the supervision of the Cardiology staff. At peak effect of the drug, 30 mCi Tc-70m sestamibi was injected intravenously and standard myocardial SPECT imaging was performed. Quantitative gated imaging was also  performed to evaluate left ventricular wall motion, and estimate left ventricular ejection fraction. COMPARISON:  None. FINDINGS: Perfusion: Moderate size region of severe decrease counts within the apex which is fixed on the stress and rest. Mild decrease counts within the apical segment of anterior wall fixed on rest and stress. There is a moderate size region of mild profusion abnormality within the mid and basilar segments of the lateral wall which improves from stress to rest. Wall Motion: Global hypokinesia. Septal hypokinesia. LEFT ventricular dilatation Left Ventricular Ejection Fraction: 28 % End diastolic volume 170 ml End systolic volume 123 ml IMPRESSION: 1. Moderate region of ischemia within the mid and basilar segment of the lateral wall. Moderate-sized scar in the anterior apical wall and apex. 2.  LEFT ventricular dilatation with global hypokinesia. 3. Left ventricular ejection fraction 28% 4. High-risk stress test findings*. *2012 Appropriate Use Criteria for Coronary Revascularization Focused Update: J Am Coll Cardiol. 2012;59(9):857-881. http://content.dementiazones.com.aspx?articleid=[email or web address] These results will be called to the ordering clinician or representative by the Radiologist Assistant, and communication documented in the PACS or zVision Dashboard. Electronically Signed   By: Genevive Bi M.D.   On: 05/06/2015 15:19    ASSESSMENT AND PLAN  1. Acute on chronic combined systolic and diastolic HF (EF 80-03%-->49-17%) - discharged in Nov with daily Lasix therapy 20 mg. However they were confused and did not take this as a did not realize this was a daily medication. - Echo 01/07/2015 EF 40-45%. Repeat echo 05/04/2015 EF 20-25%, grade 1 DD, mild LVH, mild MR.  - given sudden drop in EF, he underwent myoview which returned abnormal. Possible cath today. I will discuss with Dr. Myrtis Ser.   - Contunue coreg 6.25mg  BID and Lisinorpil 5mg     2.  Elevated trop likely demand ischemia: 0.46--> 0.74  3. CAD s/p CABG x4V (1999); s/p PCI dLAD (2004): Last cardiac cath in 2014 prior to TAVR showed occluded SVG to RCA. Other grafts were all patent. Abnormal stress tests with an apical infarct and lateral wall ischemia. EF 28%. Patient is NPO for possible cardiac cath today to rule out ischemia as the cause of this newly reduced EF.   The patient understands that risks include but are not limited to stroke (1 in 1000), death (1 in 1000), kidney failure [usually temporary] (1 in 500), bleeding (1 in 200), allergic reaction [possibly serious] (1 in 200), and agrees to proceed.   4. AS s/p TAVR (12/2012)  5. PAF (felt to be an isolated event and not on long term AC)  6. Hyponatremia: baseline Na mid 120s. Dr. Myrtis Ser reviewed previous notes from discharge in 01/2015 where chronic hyponatremia was discussed. He ultimately decided not to start Mr. Garg on Tolvaptan at  this time and encouraged fluid restriction.   7. CVA w/ residual L eye blindness  8. HTN  9. HLD  Signed, Janetta Hora PA-C  Pager [phone]  Patient seen and examined. I agree with the assessment and plan as detailed above. See also my additional thoughts below.   The patient is post remote CABG in the past and PCI in 2004. He underwent TAVR in 2014. He is now at a change in his ejection fraction. It is appropriate to proceed with cardiac catheterization today. The patient is in agreement. We will finalize plans for today.  He has significant hyponatremia. In the past it was felt that he had a component of SIADH. I had hoped that this could be managed with free water restriction. However, we may have to add a medication to treat this. Consider adding Tolvaptan. We will request that both right and left heart cath be done today. It will be helpful to very carefully assess the patient's volume status hemodynamically so that we can be sure that a component of his hyponatremia is  not volume depletion at this time. If so, treatment of choice would be normal saline.  Willa Rough, MD, [facility] 05/07/2015 7:10 AM

## 2015-05-07 NOTE — Clinical Social Work Note (Signed)
Clinical Social Work Assessment  Patient Details  Name: Hunter Choi MRN: [id number] Date of Birth: 1927-08-25  Date of referral:  05/07/15               Reason for consult:  Facility Placement                Permission sought to share information with:  Family Supports, Oceanographer granted to share information::  Yes, Verbal Permission Granted  Name::        Agency::     Relationship::     Contact Information:     Housing/Transportation Living arrangements for the past 2 months:  Single Family Home Source of Information:  Spouse Patient Interpreter Needed:  None Criminal Activity/Legal Involvement Pertinent to Current Situation/Hospitalization:  No - Comment as needed Significant Relationships:  Spouse (wife Hunter Choi) [phone]) Lives with:  Spouse Do you feel safe going back to the place where you live?  Yes Need for family participation in patient care:  Yes (Comment)  Care giving concerns:  Patient has heart failure and PT recommends home health.  Social Worker assessment / plan:  BSW intern was unable to assess patient due to confusion. BSW intern had to do assessment via telephone with patient wife Hunter Choi). BSW intern introduce herself and explained PT recommendation and referral process. Patient wife states that home health will work. Between her son and her they will be able to provide that 24 hour assistance. Patient is from home with wife. Patient was brought in due to heart failure. Patient wife and patient son is both agreeable to home health when medically stable.   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  Patient/Family's Understanding of and Emotional Response to  Diagnosis, Current Treatment, and Prognosis:  Patient family has a good insight on patient current condition and treatment. BSW intern left contact number for patient family for any questions or concerns regarding patient discharge plans.   Emotional Assessment Appearance:  Other (Comment Required (not able to asess) Attitude/Demeanor/Rapport:  Unable to Assess Affect (typically observed):  Unable to Assess Orientation:   (confused) Alcohol / Substance use:  Never Used Psych involvement (Current and /or in the community):  No (Comment)  Discharge Needs  Concerns to be addressed:  No discharge needs identified Readmission within the last 30 days:    Current discharge risk:  None Barriers to Discharge:  No Barriers Identified   Catheryn Bacon  BSW intern  [phone]

## 2015-05-07 NOTE — Progress Notes (Addendum)
Patient Name: Hunter Choi Date of Encounter: 05/07/2015     Principal Problem:   Acute on chronic heart failure (HCC) Active Problems:   Rheumatoid arthritis (HCC)   GERD (gastroesophageal reflux disease)   Thyroid disease   BPH (benign prostatic hypertrophy)   AAA (abdominal aortic aneurysm) without rupture (HCC)   Arterial hypotension   Hyponatremia   Chest pain at rest   Hyperglycemia   Hard of hearing   LV dysfunction   Coronary artery disease involving coronary bypass graft of native heart    SUBJECTIVE  No chest pain. Feeling good. Sleepy this AM. Agreeable to do cath   CURRENT MEDS . amLODipine  2.5 mg Oral Q1500  . aspirin EC  81 mg Oral Daily  . carvedilol  6.25 mg Oral BID WC  . enoxaparin (LOVENOX) injection  30 mg Subcutaneous Q24H  . folic acid  3 mg Oral Daily  . insulin aspart  0-15 Units Subcutaneous TID WC  . insulin aspart  0-5 Units Subcutaneous QHS  . levothyroxine  50 mcg Oral QAC breakfast  . lisinopril  2.5 mg Oral Daily  . methotrexate  25 mg Oral Weekly  . pantoprazole  40 mg Oral Daily  . polyethylene glycol  17 g Oral BID  . simvastatin  20 mg Oral QPM  . sodium chloride flush  3 mL Intravenous Q12H    OBJECTIVE  Filed Vitals:   05/06/15 0937 05/06/15 1132 05/06/15 2249 05/07/15 0359  BP: 101/45 111/61 113/63 123/57  Pulse: 69 63 112 61  Temp:  98.1 F (36.7 C) 97.8 F (36.6 C) 98.1 F (36.7 C)  TempSrc:  Oral Oral Oral  Resp:  16 16 16   Height:      Weight:      SpO2:  100% 100% 100%    Intake/Output Summary (Last 24 hours) at 05/07/15 0633 Last data filed at 05/07/15 0234  Gross per 24 hour  Intake    940 ml  Output    500 ml  Net    440 ml   Filed Weights   05/04/15 0622 05/05/15 0622 05/06/15 0513  Weight: 138 lb 9 oz (62.851 kg) 136 lb 1.6 oz (61.735 kg) 136 lb 5 oz (61.83 kg)    PHYSICAL EXAM  General: Pleasant, NAD. Lethargic  Neuro: Alert and oriented X 3. Moves all extremities spontaneously. Psych:  Normal affect. HEENT:  Normal  Neck: Supple without bruits or JVD. Lungs:  Resp regular and unlabored, CTA. Heart: RRR no s3, s4, or murmurs. Abdomen: Soft, non-tender, non-distended, BS + x 4.  Extremities: No clubbing, cyanosis or edema. DP/PT/Radials 2+ and equal bilaterally.  Accessory Clinical Findings  CBC  Recent Labs  05/06/15 0615  WBC 6.7  HGB 11.6*  HCT 34.4*  MCV 90.1  PLT 142*   Basic Metabolic Panel  Recent Labs  05/05/15 0435 05/06/15 0615  NA 122* 124*  K 4.0 4.2  CL 85* 84*  CO2 27 30  GLUCOSE 97 99  BUN 21* 20  CREATININE 1.11 1.14  CALCIUM 8.2* 8.7*    TELE  NSR w/ PVCs. 4 beats NSVT  Radiology/Studies  Dg Chest 2 View  05/01/2015  CLINICAL DATA:  Chest pain and weakness for 2 days EXAM: CHEST  2 VIEW COMPARISON:  11/14/2014 FINDINGS: Previous median sternotomy and CABG procedure. Aortic atherosclerosis noted. There is mild cardiac enlargement. Bilateral pleural effusions are present in areas mild to moderate interstitial edema consistent with CHF. Atelectasis is noted in  the lung bases. IMPRESSION: 1. Congestive heart failure. 2. Decreased aeration to the lung bases. Electronically Signed   By: Taylor  Stroud M.D.   On: 05/01/2015 14:05   Nm Myocar Multi W/spect W/wall Motion / Ef  05/06/2015  CLINICAL DATA:  82-year-old male with history short of breath, coronary artery disease. CABG in 1999. Stenting 2004. EXAM: MYOCARDIAL IMAGING WITH SPECT (REST AND PHARMACOLOGIC-STRESS) GATED LEFT VENTRICULAR WALL MOTION STUDY LEFT VENTRICULAR EJECTION FRACTION TECHNIQUE: Standard myocardial SPECT imaging was performed after resting intravenous injection of bowel mCi Tc-99m sestamibi. Subsequently, intravenous infusion of Lexiscan was performed under the supervision of the Cardiology staff. At peak effect of the drug, 30 mCi Tc-99m sestamibi was injected intravenously and standard myocardial SPECT imaging was performed. Quantitative gated imaging was also  performed to evaluate left ventricular wall motion, and estimate left ventricular ejection fraction. COMPARISON:  None. FINDINGS: Perfusion: Moderate size region of severe decrease counts within the apex which is fixed on the stress and rest. Mild decrease counts within the apical segment of anterior wall fixed on rest and stress. There is a moderate size region of mild profusion abnormality within the mid and basilar segments of the lateral wall which improves from stress to rest. Wall Motion: Global hypokinesia. Septal hypokinesia. LEFT ventricular dilatation Left Ventricular Ejection Fraction: 28 % End diastolic volume 170 ml End systolic volume 123 ml IMPRESSION: 1. Moderate region of ischemia within the mid and basilar segment of the lateral wall. Moderate-sized scar in the anterior apical wall and apex. 2.  LEFT ventricular dilatation with global hypokinesia. 3. Left ventricular ejection fraction 28% 4. High-risk stress test findings*. *2012 Appropriate Use Criteria for Coronary Revascularization Focused Update: J Am Coll Cardiol. 2012;59(9):857-881. http://content.onlinejacc.org/article.aspx?articleid=1201161 These results will be called to the ordering clinician or representative by the Radiologist Assistant, and communication documented in the PACS or zVision Dashboard. Electronically Signed   By: Stewart  Edmunds M.D.   On: 05/06/2015 15:19    ASSESSMENT AND PLAN  1. Acute on chronic combined systolic and diastolic HF (EF 40-45%-->20-25%) - discharged in Nov with daily Lasix therapy 20 mg. However they were confused and did not take this as a did not realize this was a daily medication. - Echo 01/07/2015 EF 40-45%. Repeat echo 05/04/2015 EF 20-25%, grade 1 DD, mild LVH, mild MR.  - given sudden drop in EF, he underwent myoview which returned abnormal. Possible cath today. I will discuss with Dr. Kieffer Blatz.   - Contunue coreg 6.25mg BID and Lisinorpil 5mg    2.  Elevated trop likely demand ischemia: 0.46--> 0.74  3. CAD s/p CABG x4V (1999); s/p PCI dLAD (2004): Last cardiac cath in 2014 prior to TAVR showed occluded SVG to RCA. Other grafts were all patent. Abnormal stress tests with an apical infarct and lateral wall ischemia. EF 28%. Patient is NPO for possible cardiac cath today to rule out ischemia as the cause of this newly reduced EF.   The patient understands that risks include but are not limited to stroke (1 in 1000), death (1 in 1000), kidney failure [usually temporary] (1 in 500), bleeding (1 in 200), allergic reaction [possibly serious] (1 in 200), and agrees to proceed.   4. AS s/p TAVR (12/2012)  5. PAF (felt to be an isolated event and not on long term AC)  6. Hyponatremia: baseline Na mid 120s. Dr. Elmire Amrein reviewed previous notes from discharge in 01/2015 where chronic hyponatremia was discussed. He ultimately decided not to start Mr. [place] on Tolvaptan at   this time and encouraged fluid restriction.   7. CVA w/ residual L eye blindness  8. HTN  9. HLD  Signed, Janetta Hora PA-C  Pager [phone]  Patient seen and examined. I agree with the assessment and plan as detailed above. See also my additional thoughts below.   The patient is post remote CABG in the past and PCI in 2004. He underwent TAVR in 2014. He is now at a change in his ejection fraction. It is appropriate to proceed with cardiac catheterization today. The patient is in agreement. We will finalize plans for today.  He has significant hyponatremia. In the past it was felt that he had a component of SIADH. I had hoped that this could be managed with free water restriction. However, we may have to add a medication to treat this. Consider adding Tolvaptan. We will request that both right and left heart cath be done today. It will be helpful to very carefully assess the patient's volume status hemodynamically so that we can be sure that a component of his hyponatremia is  not volume depletion at this time. If so, treatment of choice would be normal saline.  Willa Rough, MD, [facility] 05/07/2015 7:10 AM

## 2015-05-07 NOTE — Progress Notes (Signed)
[facility]                                                                              Patient Demographics  Hunter Choi, is a 82 y.o. male, DOB - [birth year], ZOX:[id number]  Admit date - 05/01/2015   Admitting Physician Edsel Petrin, DO  Outpatient Primary MD for the patient is Shamleffer, Landry Mellow, MD  LOS - 6   Chief Complaint  Patient presents with  . Shortness of Breath  . Chest Pain       Brief HPI   Hunter Choi is a very pleasant 82 y.o. male with a past medical history that includes CAD, hypertension, hyperlipidemia, chronic hyponatremia chronic thrombocytopenia chronic combined heart failure, bioprosthetic aortic valve presented to ED with dyspnea and chest pain. Patient's family reported reported a 3 day history of gradually worsening shortness of breath. Associated symptoms include intermittent left anterior chest pain. He denied any cough lower extremity edema or orthopnea. He denies palpitations diaphoresis nausea vomiting abdominal pain. He denies dysuria hematuria frequency or urgency. Family does indicate that he has not been taking his Lasix that was prescribed at last hospitalization as they did not understand it was a daily medication. In the emergency department he is afebrile hemodynamically stable and not hypoxic. He is given 40 mg of Lasix intravenously   Assessment & Plan    Principal Problem: Acute on chronic combined heart failure. Echo done in October 2016 mild LVH. EF 40-45% was diffuse hypokinesis, grade 1 diastolic dysfunction.  Likely related to the fact he's not been taking his Lasix due to misunderstanding at discharge BNP over 4500 - Patient was placed on IV Lasix.  Negative balance of 2.4 L  - Continue ACE inhibitor and beta blocker -2-D echo showed EF of 20-25%, dropped from prior echo in 10/16, EF was 40-45%, stress test also abnormal, cardiology pending cardiac cath today   Chest pain. Some typical and atypical  features. Initial troponin 0.05. EKG without acute changes. No chest pain at this time - 2-D echo showed EF of 20-25% with hypokinesis diffuse, grade 1 diastolic dysfunction, prior echo in 10/16 had shown EF of 40-45%. -Stress test with moderate region of ischemia within the mid and basilar segment of lateral wall, high risk, EF 28%  - Cardiac cath planned at this time   Abdominal aortic aneurysm without rupture. He is surveilled every 6 months by Dr. Edilia Bo. Also has history of aortic valve disease, status post TAVR in 10/14, follows Dr. Verdis Prime  Hyponatremia.  acute on Chronic hyponatremia, has chronically low sodium - Sodium trending  down to 121 again, patient has been on aggressive diuresis with no significant improvement  - Currently Lasix on hold  - Renal consult called, I discussed with Dr. Briant Cedar, will follow recommendations   Rheumatoid arthritis. Stable at baseline -Continue home meds -Physical therapy-> HPT  Hyperglycemia. -hemoglobin A1c 6.0  -Sliding scale insulin  Code Status: Full code  Family Communication: Discussed in detail with the patient, all imaging results, lab results explained to the patient    Disposition Plan:   Time Spent in minutes 25 minutes  Procedures  2-D echo Nuclear medicine stress test Cardiac cath today  Consults   Cardiology Renal, Dr. Briant Cedar called today  DVT Prophylaxis  Lovenox   Medications  Scheduled Meds: . amLODipine  2.5 mg Oral Q1500  . aspirin EC  81 mg Oral Daily  . carvedilol  6.25 mg Oral BID WC  . enoxaparin (LOVENOX) injection  30 mg Subcutaneous Q24H  . folic acid  3 mg Oral Daily  . insulin aspart  0-15 Units Subcutaneous TID WC  . insulin aspart  0-5 Units Subcutaneous QHS  . levothyroxine  50 mcg Oral QAC breakfast  . lisinopril  2.5 mg Oral Daily  . methotrexate  25 mg Oral Weekly  . pantoprazole  40 mg Oral Daily  . polyethylene glycol  17 g Oral BID  . simvastatin  20 mg Oral QPM  . sodium  chloride flush  3 mL Intravenous Q12H  . sodium chloride flush  3 mL Intravenous Q12H   Continuous Infusions: . sodium chloride     PRN Meds:.sodium chloride, sodium chloride, acetaminophen **OR** acetaminophen, ondansetron **OR** ondansetron (ZOFRAN) IV, sodium chloride flush, sodium chloride flush   Antibiotics   Anti-infectives    None        Subjective:   Macdonald Rigor was seen and examined today. No chest pain. Patient denies dizziness,  shortness of breath, abdominal pain, N/V/D/C, new weakness, numbess, tingling. No acute events overnight.    Objective:   Blood pressure 123/57, pulse 61, temperature 98.1 F (36.7 C), temperature source Oral, resp. rate 16, height 5\' 7"  (1.702 m), weight 61.83 kg (136 lb 5 oz), SpO2 100 %.  Wt Readings from Last 3 Encounters:  05/06/15 61.83 kg (136 lb 5 oz)  02/03/15 60.2 kg (132 lb 11.5 oz)  01/14/15 68.493 kg (151 lb)     Intake/Output Summary (Last 24 hours) at 05/07/15 1159 Last data filed at 05/07/15 0919  Gross per 24 hour  Intake    940 ml  Output    700 ml  Net    240 ml    Exam  General: Alert and oriented x 3, NAD  HEENT:  PERRLA, EOMI   Neck: Supple, no JVD, no masses  CVS: S1 S2 auscultated, 1/6 systolic murmur   Respiratory: Decreased breath sounds at the bases  Abdomen: Soft, NT, ND, NBS  Ext: no cyanosis clubbing or edema  Neuro:  no new deficits  skin: No rashes  Psych: Normal affect and demeanor, alert and oriented x3    Data Review   Micro Results No results found for this or any previous visit (from the past 240 hour(s)).  Radiology Reports Dg Chest 2 View  05/01/2015  CLINICAL DATA:  Chest pain and weakness for 2 days EXAM: CHEST  2 VIEW COMPARISON:  11/14/2014 FINDINGS: Previous median sternotomy and CABG procedure. Aortic atherosclerosis noted. There is mild cardiac enlargement. Bilateral pleural effusions are present in areas mild to moderate interstitial edema consistent with CHF.  Atelectasis is noted in the lung bases. IMPRESSION: 1. Congestive heart failure. 2. Decreased aeration to the lung bases. Electronically Signed   By: 11/16/2014 M.D.   On: 05/01/2015 14:05   Nm Myocar Multi W/spect W/wall Motion / Ef  05/06/2015  CLINICAL DATA:  83 year old male with history short of breath, coronary artery disease. CABG in 1999. Stenting 2004. EXAM: MYOCARDIAL IMAGING WITH SPECT (REST AND PHARMACOLOGIC-STRESS) GATED LEFT VENTRICULAR WALL MOTION STUDY LEFT VENTRICULAR EJECTION FRACTION TECHNIQUE: Standard myocardial SPECT imaging was performed after resting  intravenous injection of bowel mCi Tc-79m sestamibi. Subsequently, intravenous infusion of Lexiscan was performed under the supervision of the Cardiology staff. At peak effect of the drug, 30 mCi Tc-60m sestamibi was injected intravenously and standard myocardial SPECT imaging was performed. Quantitative gated imaging was also performed to evaluate left ventricular wall motion, and estimate left ventricular ejection fraction. COMPARISON:  None. FINDINGS: Perfusion: Moderate size region of severe decrease counts within the apex which is fixed on the stress and rest. Mild decrease counts within the apical segment of anterior wall fixed on rest and stress. There is a moderate size region of mild profusion abnormality within the mid and basilar segments of the lateral wall which improves from stress to rest. Wall Motion: Global hypokinesia. Septal hypokinesia. LEFT ventricular dilatation Left Ventricular Ejection Fraction: 28 % End diastolic volume 170 ml End systolic volume 123 ml IMPRESSION: 1. Moderate region of ischemia within the mid and basilar segment of the lateral wall. Moderate-sized scar in the anterior apical wall and apex. 2.  LEFT ventricular dilatation with global hypokinesia. 3. Left ventricular ejection fraction 28% 4. High-risk stress test findings*. *2012 Appropriate Use Criteria for Coronary Revascularization Focused Update:  J Am Coll Cardiol. 2012;59(9):857-881. http://content.dementiazones.com.aspx?articleid=1201161 These results will be called to the ordering clinician or representative by the Radiologist Assistant, and communication documented in the PACS or zVision Dashboard. Electronically Signed   By: Genevive Bi M.D.   On: 05/06/2015 15:19    CBC  Recent Labs Lab 05/01/15 1343 05/06/15 0615  WBC 9.4 6.7  HGB 12.5* 11.6*  HCT 39.2 34.4*  PLT 172 142*  MCV 93.6 90.1  MCH 29.8 30.4  MCHC 31.9 33.7  RDW 17.0* 16.4*    Chemistries   Recent Labs Lab 05/03/15 0345 05/04/15 0449 05/05/15 0435 05/06/15 0615 05/07/15 0618  NA 126* 125* 122* 124* 121*  K 4.1 4.4 4.0 4.2 4.1  CL 88* 88* 85* 84* 85*  CO2 26 28 27 30 28   GLUCOSE 90 93 97 99 94  BUN 17 18 21* 20 26*  CREATININE 1.02 1.14 1.11 1.14 1.19  CALCIUM 8.2* 8.3* 8.2* 8.7* 8.3*   ------------------------------------------------------------------------------------------------------------------ estimated creatinine clearance is 39.7 mL/min (by C-G formula based on Cr of 1.19). ------------------------------------------------------------------------------------------------------------------ No results for input(s): HGBA1C in the last 72 hours. ------------------------------------------------------------------------------------------------------------------ No results for input(s): CHOL, HDL, LDLCALC, TRIG, CHOLHDL, LDLDIRECT in the last 72 hours. ------------------------------------------------------------------------------------------------------------------ No results for input(s): TSH, T4TOTAL, T3FREE, THYROIDAB in the last 72 hours.  Invalid input(s): FREET3 ------------------------------------------------------------------------------------------------------------------ No results for input(s): VITAMINB12, FOLATE, FERRITIN, TIBC, IRON, RETICCTPCT in the last 72 hours.  Coagulation profile  Recent Labs Lab 05/07/15 0910   INR 1.14    No results for input(s): DDIMER in the last 72 hours.  Cardiac Enzymes  Recent Labs Lab 05/01/15 2046 05/02/15 0443  TROPONINI 0.46* 0.74*   ------------------------------------------------------------------------------------------------------------------ Invalid input(s): POCBNP   Recent Labs  05/06/15 0641 05/06/15 1131 05/06/15 1627 05/06/15 2216 05/07/15 0609 05/07/15 1112  GLUCAP 89 99 108* 119* 102* 97     Lyndon Chenoweth M.D. [facility] 05/07/2015, 11:59 AM  Pager: [phone] Between 7am to 7pm - call Pager - [phone]  After 7pm go to [email or web address] - password [email or web address]  Call night coverage person covering after 7pm

## 2015-05-07 NOTE — Interval H&P Note (Signed)
History and Physical Interval Note: Cath Lab Visit (complete for each Cath Lab visit)  Clinical Evaluation Leading to the Procedure:   ACS: No.  Non-ACS:    Anginal Classification: CCS IV  Anti-ischemic medical therapy: Maximal Therapy (2 or more classes of medications)  Non-Invasive Test Results: No non-invasive testing performed  Prior CABG: Previous CABG       05/07/2015 12:46 PM  [name]  has presented today for surgery, with the diagnosis of cp  The various methods of treatment have been discussed with the patient and family. After consideration of risks, benefits and other options for treatment, the patient has consented to  Procedure(s): Right/Left Heart Cath and Coronary/Graft Angiography (N/A) as a surgical intervention .  The patient's history has been reviewed, patient examined, no change in status, stable for surgery.  I have reviewed the patient's chart and labs.  Questions were answered to the patient's satisfaction.     Lesleigh Noe

## 2015-05-07 NOTE — Consult Note (Signed)
[facility] Consult Note     Date: 05/07/2015                  Patient Name:  Hunter Choi  MRN: [id number]  DOB: Aug 19, 1927  Age / Sex: 82 y.o., male         PCP: Shamleffer, Herschell Dimes, MD                 Service Requesting Consult: [facility] ([facility])                 Reason for Consult: Hyponatremia            Chief Complaint: dyspnea HPI: Hunter Choi is a 82 y.o. M with PMH of CAD, HLD, HTN, chronic hyponatremia, chronic thrombocytopenia, chronic combined heart failure initially presenting to ED with CC of dyspnea and chest pain.  He was supposed to be taking Lasix PO '20mg'$  daily at home PTA, but was confused and did not take daily.  He was admitted for acute on chronic CHF exacerbation and mildly elevated troponin.  He was noted to have worsening of hyponatremia, and renal was asked to consult on hyponatremia.  Past Medical History  Diagnosis Date  . Hypertension   . GERD (gastroesophageal reflux disease)   . Aortic stenosis, severe     a. s/p TAVR  . Embolism involving retinal artery   . BPH (benign prostatic hypertrophy)     Dr. Diona Fanti  . Iliac artery aneurysm (HCC)     Dr. Scot Dock  . AAA (abdominal aortic aneurysm) ([place])     Dr. Rosalia Hammers  . Diastolic heart failure ([place])   . Coronary artery disease     Left main disease and severe 3-vessel CAD  . CAD (coronary artery disease) of bypass graft     Chronic occlusion of saphenous vein graft to RCA  . Hypothyroidism   . Stroke [facility])     mini stroke effective his eye left  . 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 12/2012: mm Berniece Pap XT transcatheter heart valve placed via transapical approach  . PAF (paroxysmal atrial fibrillation) ([place])   . Blind left eye     Past Surgical History  Procedure Laterality Date  . Hemiarthroplasty hip Left     AFTER HIP FX  . Total knee arthroplasty  1992-2002    right-left  . Biopsy prostate  2005  . Transurethral  resection of prostate  2012  . Joint replacement    . Harvest bone graft  1982    FOR THE  ANKLE  . Skin grafts  1968  . Colonoscopy    . Bunionectomy with hammertoe reconstruction Bilateral   . Transcatheter aortic valve replacement, transapical N/A 01/15/2013    Procedure: TRANSCATHETER AORTIC VALVE REPLACEMENT, TRANSAPICAL;  Surgeon: Gaye Pollack, MD;  Location: [facility] OR;  Service: Open Heart Surgery;  Laterality: N/A;  . Intraoperative transesophageal echocardiogram N/A 01/15/2013    Procedure: INTRAOPERATIVE TRANSESOPHAGEAL ECHOCARDIOGRAM;  Surgeon: Gaye Pollack, MD;  Location: [facility] OR;  Service: Open Heart Surgery;  Laterality: N/A;  . Left and right heart catheterization with coronary angiogram N/A 12/20/2012    Procedure: LEFT AND RIGHT HEART CATHETERIZATION WITH CORONARY ANGIOGRAM;  Surgeon: Sinclair Grooms, MD;  Location: [facility] CATH LAB;  Service: Cardiovascular;  Laterality: N/A;  . Carotid endarterectomy Right Jan. 2, 1997  . Cardiac catheterization    . Total hip arthroplasty Left 2007  . Cataract  extraction w/ intraocular lens implant Right   . Coronary artery bypass graft  01/1998    Dr. Lanelle Bal; LIMA to LAD, SVG to D1, SVG to LCx, SVG to RCA, open saphenous vein harvest via right lower extremity  . Cholecystectomy N/A 01/28/2015    Procedure: LAPAROSCOPIC CHOLECYSTECTOMY WITH INTRAOPERATIVE CHOLANGIOGRAM;  Surgeon: Greer Pickerel, MD;  Location: [facility] OR;  Service: General;  Laterality: N/A;    Family History  Problem Relation Age of Onset  . Heart attack Mother   . Heart disease Mother     Before age 56  . Hyperlipidemia Mother   . Hypertension Mother   . Hypertension Son   . Heart disease Father     AAA-   . Hyperlipidemia Father   . Heart attack Father   . Hypertension Father   . Heart disease Brother     Before age 9  . Hyperlipidemia Brother   . Heart attack Brother   . Hypertension Brother    Social History:  reports that he quit smoking about 57 years  ago. His smoking use included Cigarettes. He has a 23.5 pack-year smoking history. He has quit using smokeless tobacco. His smokeless tobacco use included Chew. He reports that he does not drink alcohol or use illicit drugs.  Allergies:  Allergies  Allergen Reactions  . Ciprofloxacin Other (See Comments)    REACTION: mild delirium    Medications Prior to Admission  Medication Sig Dispense Refill  . acetaminophen (TYLENOL) 325 MG tablet Take 2 tablets (650 mg total) by mouth every 6 (six) hours as needed for mild pain, moderate pain, fever or headache.    Marland Kitchen amLODipine (NORVASC) 5 MG tablet Take 0.5 tablets (2.5 mg total) by mouth daily at 3 pm.    . aspirin EC 81 MG EC tablet Take 1 tablet (81 mg total) by mouth daily.    . folic acid (FOLVITE) 1 MG tablet Take 3 mg by mouth daily.    . furosemide (LASIX) 20 MG tablet Take 1 tablet (20 mg total) by mouth daily. 30 tablet 0  . levothyroxine (SYNTHROID, LEVOTHROID) 50 MCG tablet Take 50 mcg by mouth daily.  0  . lisinopril (PRINIVIL,ZESTRIL) 2.5 MG tablet Take 1 tablet (2.5 mg total) by mouth daily. 30 tablet 0  . methotrexate 2.5 MG tablet Take 25 mg by mouth every Friday.     . metoprolol tartrate (LOPRESSOR) 25 MG tablet Take 1 tablet (25 mg total) by mouth 2 (two) times daily. 60 tablet 0  . omeprazole (PRILOSEC) 20 MG capsule Take 20 mg by mouth daily.    . polyethylene glycol (MIRALAX / GLYCOLAX) packet Take 17 g by mouth 2 (two) times daily. 14 each 0  . predniSONE (DELTASONE) 5 MG tablet Take 5 mg by mouth daily.  3  . simvastatin (ZOCOR) 20 MG tablet Take 20 mg by mouth every evening.  12    Results for orders placed or performed during the hospital encounter of 05/01/15 (from the past 48 hour(s))  Glucose, capillary     Status: Abnormal   Collection Time: 05/05/15  4:26 PM  Result Value Ref Range   Glucose-Capillary 144 (H) 65 - 99 mg/dL  Glucose, capillary     Status: Abnormal   Collection Time: 05/05/15  9:46 PM  Result  Value Ref Range   Glucose-Capillary 132 (H) 65 - 99 mg/dL  Basic metabolic panel     Status: Abnormal   Collection Time: 05/06/15  6:15 AM  Result  Value Ref Range   Sodium 124 (L) 135 - 145 mmol/L   Potassium 4.2 3.5 - 5.1 mmol/L   Chloride 84 (L) 101 - 111 mmol/L   CO2 30 22 - 32 mmol/L   Glucose, Bld 99 65 - 99 mg/dL   BUN 20 6 - 20 mg/dL   Creatinine, Ser 1.14 0.61 - 1.24 mg/dL   Calcium 8.7 (L) 8.9 - 10.3 mg/dL   GFR calc non Af Amer 57 (L) >60 mL/min   GFR calc Af Amer >60 >60 mL/min    Comment: (NOTE) The eGFR has been calculated using the CKD EPI equation. This calculation has not been validated in all clinical situations. eGFR's persistently <60 mL/min signify possible Chronic Kidney Disease.    Anion gap 10 5 - 15  CBC     Status: Abnormal   Collection Time: 05/06/15  6:15 AM  Result Value Ref Range   WBC 6.7 4.0 - 10.5 K/uL   RBC 3.82 (L) 4.22 - 5.81 MIL/uL   Hemoglobin 11.6 (L) 13.0 - 17.0 g/dL   HCT 34.4 (L) 39.0 - 52.0 %   MCV 90.1 78.0 - 100.0 fL   MCH 30.4 26.0 - 34.0 pg   MCHC 33.7 30.0 - 36.0 g/dL   RDW 16.4 (H) 11.5 - 15.5 %   Platelets 142 (L) 150 - 400 K/uL  Glucose, capillary     Status: None   Collection Time: 05/06/15  6:41 AM  Result Value Ref Range   Glucose-Capillary 89 65 - 99 mg/dL  Glucose, capillary     Status: None   Collection Time: 05/06/15 11:31 AM  Result Value Ref Range   Glucose-Capillary 99 65 - 99 mg/dL  Glucose, capillary     Status: Abnormal   Collection Time: 05/06/15  4:27 PM  Result Value Ref Range   Glucose-Capillary 108 (H) 65 - 99 mg/dL   Comment 1 Notify RN   Glucose, capillary     Status: Abnormal   Collection Time: 05/06/15 10:16 PM  Result Value Ref Range   Glucose-Capillary 119 (H) 65 - 99 mg/dL   Comment 1 Notify RN    Comment 2 Document in Chart   Glucose, capillary     Status: Abnormal   Collection Time: 05/07/15  6:09 AM  Result Value Ref Range   Glucose-Capillary 102 (H) 65 - 99 mg/dL   Comment 1 Notify  RN    Comment 2 Document in Chart   Basic metabolic panel     Status: Abnormal   Collection Time: 05/07/15  6:18 AM  Result Value Ref Range   Sodium 121 (L) 135 - 145 mmol/L   Potassium 4.1 3.5 - 5.1 mmol/L   Chloride 85 (L) 101 - 111 mmol/L   CO2 28 22 - 32 mmol/L   Glucose, Bld 94 65 - 99 mg/dL   BUN 26 (H) 6 - 20 mg/dL   Creatinine, Ser 1.19 0.61 - 1.24 mg/dL   Calcium 8.3 (L) 8.9 - 10.3 mg/dL   GFR calc non Af Amer 54 (L) >60 mL/min   GFR calc Af Amer >60 >60 mL/min    Comment: (NOTE) The eGFR has been calculated using the CKD EPI equation. This calculation has not been validated in all clinical situations. eGFR's persistently <60 mL/min signify possible Chronic Kidney Disease.    Anion gap 8 5 - 15  Protime-INR     Status: None   Collection Time: 05/07/15  9:10 AM  Result Value Ref Range  Prothrombin Time 14.8 11.6 - 15.2 seconds   INR 1.14 0.00 - 1.49  Glucose, capillary     Status: None   Collection Time: 05/07/15 11:12 AM  Result Value Ref Range   Glucose-Capillary 97 65 - 99 mg/dL   Comment 1 Notify RN    Nm Myocar Multi W/spect W/wall Motion / Ef  05/06/2015  CLINICAL DATA:  82 year old male with history short of breath, coronary artery disease. CABG in 1999. Stenting 2004. EXAM: MYOCARDIAL IMAGING WITH SPECT (REST AND PHARMACOLOGIC-STRESS) GATED LEFT VENTRICULAR WALL MOTION STUDY LEFT VENTRICULAR EJECTION FRACTION TECHNIQUE: Standard myocardial SPECT imaging was performed after resting intravenous injection of bowel mCi [id number]. Subsequently, intravenous infusion of Lexiscan was performed under the supervision of the Cardiology staff. At peak effect of the drug, 30 mCi Tc-957mestamibi was injected intravenously and standard myocardial SPECT imaging was performed. Quantitative gated imaging was also performed to evaluate left ventricular wall motion, and estimate left ventricular ejection fraction. COMPARISON:  None. FINDINGS: Perfusion: Moderate size region of  severe decrease counts within the apex which is fixed on the stress and rest. Mild decrease counts within the apical segment of anterior wall fixed on rest and stress. There is a moderate size region of mild profusion abnormality within the mid and basilar segments of the lateral wall which improves from stress to rest. Wall Motion: Global hypokinesia. Septal hypokinesia. LEFT ventricular dilatation Left Ventricular Ejection Fraction: 28 % End diastolic volume 17016l End systolic volume 12553l IMPRESSION: 1. Moderate region of ischemia within the mid and basilar segment of the lateral wall. Moderate-sized scar in the anterior apical wall and apex. 2.  LEFT ventricular dilatation with global hypokinesia. 3. Left ventricular ejection fraction 28% 4. High-risk stress test findings*. *2012 Appropriate Use Criteria for Coronary Revascularization Focused Update: J Am Coll Cardiol. 207482;70(7):867-544http://content.onairportbarriers.comspx?articleid=1201161 These results will be called to the ordering clinician or representative by the Radiologist Assistant, and communication documented in the PACS or zVision Dashboard. Electronically Signed   By: StSuzy Bouchard.D.   On: 05/06/2015 15:19    Review of Systems  Respiratory: Positive for shortness of breath.   Cardiovascular: Negative for chest pain and leg swelling.  Gastrointestinal: Negative for nausea, vomiting and abdominal pain.  All other systems reviewed and are negative.   Blood pressure 121/57, pulse 76, temperature 98.7 F (37.1 C), temperature source Oral, resp. rate 16, height _0  (1.702 m), weight 136 lb 5 oz (61.83 kg), SpO2 98 %. Physical Exam   Gen: Laying comfortably in bed, NAD HEENT: MMM CV: RRR, no murmurs appreciated Pulm: Decreased breath sounds in bases, unable to appreciate any crackles Abd: Soft, NTND, + BS, pulsatile mass palpated Ext: No edema  Assessment/Plan Acute on Chronic Hyponatremia: Baseline Na 120-125 since  10/2014. Na 132 on admission and decreasing to 121 this AM.  Patient with acute exacerbation of CHF and volume stable on exam, though difficult to examine as he just had cardiac cath. No Lasix in >48hr.  Did receive some NS during cath today.  - Recheck urine Osm, serum Osm, and urine Na while off diuretics - also recheck serum Na - likely needs more lasix for further diuresis, but awaiting RHC pressures to determine volume status - Check TSH and cortisol levels  CKD 2: Cr 1-1.2 at baseline in elderly man with low muscle mass. - Monitor Cr closely after Contrast - Discontinued Lisinopril  Acute on chronic CHF: Per primary team. S/p L & RHC 05/07/15.  Chest  pain: Per primary team.   Virginia Crews, MD, MPH PGY-2,  [facility] 05/07/2015 3:44 PM   I have seen and examined this patient and agree with plan as outlined by Dr. Brita Romp.  Hunter Choi presented with acute on chronic CHF and worsening hyponatremia.  His Uosm was lower than Sosm,  but not maximally dilute in setting of IV diuresis.  He has a history of chronic hyponatremia and is not far off from his baseline of 122-125.  His Sodium level has dropped over the last 48 hours but he has not had any lasix since 05/05/15.  Will recheck urine and serum studies off of lasix but he will likely need to resume this given his recent cath and ongoing CHF.  He is also asymptomatic despite a low sodium level.  Resume lasix 58m po daily and follow serum sodium levels.  He has severe CAD including left main disease, and an ischemic cardiomyopathy with an EF of 25%, so would not be too aggressive given his advanced age and multiple co-morbidities.   Recommend Palliative care consult to set goals/limits of care.  JBroadus JohnA Aleysia Oltmann,MD 05/07/2015 4:04 PM

## 2015-05-07 NOTE — Progress Notes (Signed)
I discussed the right heart cath results with Dr. 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  Will hold off on lasix for now and see how he does overnight.  His serum sodium did improve somewhat with gentle IV NS during the procedure.

## 2015-05-07 NOTE — Progress Notes (Signed)
Site area: rt groin Site Prior to Removal:  Level 0 Pressure Applied For:  25  minutes Manual:   yes Patient Status During Pull:  stable Post Pull Site:  Level  0 Post Pull Instructions Given:  yes Post Pull Pulses Present: yes Dressing Applied:  tegaderm Bedrest begins @ 1445 Comments:

## 2015-05-07 NOTE — Progress Notes (Signed)
PT Cancellation Note  Patient Details Name: Hunter Choi MRN: [id number] DOB: [birth year]   Cancelled Treatment:    Reason Eval/Treat Not Completed: Patient at procedure or test/unavailable, cath lab   Fabio Asa 05/07/2015, 1:36 PM Charlotte Crumb, PT DPT  [phone]

## 2015-05-08 DIAGNOSIS — N400 Enlarged prostate without lower urinary tract symptoms: Secondary | ICD-10-CM

## 2015-05-08 DIAGNOSIS — R079 Chest pain, unspecified: Secondary | ICD-10-CM

## 2015-05-08 DIAGNOSIS — I5023 Acute on chronic systolic (congestive) heart failure: Secondary | ICD-10-CM

## 2015-05-08 LAB — GLUCOSE, CAPILLARY
GLUCOSE-CAPILLARY: 128 mg/dL — AB (ref 65–99)
GLUCOSE-CAPILLARY: 121 mg/dL — AB (ref 65–99)
Glucose-Capillary: 83 mg/dL (ref 65–99)
Glucose-Capillary: 108 mg/dL — ABNORMAL HIGH (ref 65–99)

## 2015-05-08 LAB — RENAL FUNCTION PANEL
Albumin: 2.9 g/dL — ABNORMAL LOW (ref 3.5–5.0)
Anion gap: 10 (ref 5–15)
BUN: 23 mg/dL — AB (ref 6–20)
CALCIUM: 8.4 mg/dL — AB (ref 8.9–10.3)
CO2: 27 mmol/L (ref 22–32)
CREATININE: 1.27 mg/dL — AB (ref 0.61–1.24)
Chloride: 86 mmol/L — ABNORMAL LOW (ref 101–111)
GFR calc non Af Amer: 50 mL/min — ABNORMAL LOW (ref >60)
GFR, EST AFRICAN AMERICAN: 58 mL/min — AB (ref >60)
Glucose, Bld: 81 mg/dL (ref 65–99)
Phosphorus: 4.3 mg/dL (ref 2.5–4.6)
Potassium: 4.4 mmol/L (ref 3.5–5.1)
SODIUM: 123 mmol/L — AB (ref 135–145)

## 2015-05-08 NOTE — Progress Notes (Signed)
Patient Name: Hunter Choi Date of Encounter: 05/08/2015     Principal Problem:   Acute on chronic heart failure (HCC) Active Problems:   Rheumatoid arthritis (HCC)   GERD (gastroesophageal reflux disease)   Thyroid disease   BPH (benign prostatic hypertrophy)   AAA (abdominal aortic aneurysm) without rupture (HCC)   Arterial hypotension   Hyponatremia   Chest pain at rest   Hyperglycemia   Hard of hearing   LV dysfunction   Coronary artery disease involving coronary bypass graft of native heart    SUBJECTIVE  Resting comfortably. No complaints. No Chest pain. Breathing good.   CURRENT MEDS . amLODipine  2.5 mg Oral Q1500  . aspirin EC  81 mg Oral Daily  . carvedilol  6.25 mg Oral BID WC  . enoxaparin (LOVENOX) injection  30 mg Subcutaneous Q24H  . folic acid  3 mg Oral Daily  . insulin aspart  0-15 Units Subcutaneous TID WC  . insulin aspart  0-5 Units Subcutaneous QHS  . levothyroxine  50 mcg Oral QAC breakfast  . methotrexate  25 mg Oral Weekly  . pantoprazole  40 mg Oral Daily  . polyethylene glycol  17 g Oral BID  . simvastatin  20 mg Oral QPM  . sodium chloride flush  3 mL Intravenous Q12H  . sodium chloride flush  3 mL Intravenous Q12H    OBJECTIVE  Filed Vitals:   05/07/15 2105 05/08/15 0044 05/08/15 0359 05/08/15 0622  BP: 116/44 107/46  118/45  Pulse: 61 64  63  Temp:  98.2 F (36.8 C)  98.7 F (37.1 C)  TempSrc:  Oral  Oral  Resp:  16  16  Height:      Weight:   135 lb 11.2 oz (61.553 kg)   SpO2:  96%  99%    Intake/Output Summary (Last 24 hours) at 05/08/15 0748 Last data filed at 05/08/15 0600  Gross per 24 hour  Intake    120 ml  Output    580 ml  Net   -460 ml   Filed Weights   05/05/15 0622 05/06/15 0513 05/08/15 0359  Weight: 136 lb 1.6 oz (61.735 kg) 136 lb 5 oz (61.83 kg) 135 lb 11.2 oz (61.553 kg)    PHYSICAL EXAM  General: Pleasant, NAD. Lethargic  Neuro: Alert and oriented X 3. Moves all extremities  spontaneously. Psych: Normal affect. HEENT:  Normal  Neck: Supple without bruits or JVD. Lungs:  Resp regular and unlabored, CTA. Heart: RRR no s3, s4, or murmurs. Abdomen: Soft, non-tender, non-distended, BS + x 4.  Extremities: No clubbing, cyanosis or edema. DP/PT/Radials 2+ and equal bilaterally.  Accessory Clinical Findings  CBC  Recent Labs  05/06/15 0615 05/07/15 1519  WBC 6.7 8.2  HGB 11.6* 12.0*  HCT 34.4* 36.2*  MCV 90.1 88.7  PLT 142* 141*   Basic Metabolic Panel  Recent Labs  05/07/15 1534 05/08/15 0420  NA 123* 123*  K 4.8 4.4  CL 86* 86*  CO2 26 27  GLUCOSE 104* 81  BUN 21* 23*  CREATININE 0.98 1.27*  CALCIUM 8.5* 8.4*  PHOS 3.7 4.3    TELE  NSR w/ PVCs.  Radiology/Studies  Dg Chest 2 View  05/01/2015  CLINICAL DATA:  Chest pain and weakness for 2 days EXAM: CHEST  2 VIEW COMPARISON:  11/14/2014 FINDINGS: Previous median sternotomy and CABG procedure. Aortic atherosclerosis noted. There is mild cardiac enlargement. Bilateral pleural effusions are present in areas mild to moderate interstitial  edema consistent with CHF. Atelectasis is noted in the lung bases. IMPRESSION: 1. Congestive heart failure. 2. Decreased aeration to the lung bases. Electronically Signed   By: Signa Kell M.D.   On: 05/01/2015 14:05   Nm Myocar Multi W/spect W/wall Motion / Ef  05/06/2015  CLINICAL DATA:  82 year old male with history short of breath, coronary artery disease. CABG in 1999. Stenting 2004. EXAM: MYOCARDIAL IMAGING WITH SPECT (REST AND PHARMACOLOGIC-STRESS) GATED LEFT VENTRICULAR WALL MOTION STUDY LEFT VENTRICULAR EJECTION FRACTION TECHNIQUE: Standard myocardial SPECT imaging was performed after resting intravenous injection of bowel mCi Tc-42m sestamibi. Subsequently, intravenous infusion of Lexiscan was performed under the supervision of the Cardiology staff. At peak effect of the drug, 30 mCi Tc-35m sestamibi was injected intravenously and standard myocardial  SPECT imaging was performed. Quantitative gated imaging was also performed to evaluate left ventricular wall motion, and estimate left ventricular ejection fraction. COMPARISON:  None. FINDINGS: Perfusion: Moderate size region of severe decrease counts within the apex which is fixed on the stress and rest. Mild decrease counts within the apical segment of anterior wall fixed on rest and stress. There is a moderate size region of mild profusion abnormality within the mid and basilar segments of the lateral wall which improves from stress to rest. Wall Motion: Global hypokinesia. Septal hypokinesia. LEFT ventricular dilatation Left Ventricular Ejection Fraction: 28 % End diastolic volume 170 ml End systolic volume 123 ml IMPRESSION: 1. Moderate region of ischemia within the mid and basilar segment of the lateral wall. Moderate-sized scar in the anterior apical wall and apex. 2.  LEFT ventricular dilatation with global hypokinesia. 3. Left ventricular ejection fraction 28% 4. High-risk stress test findings*. *2012 Appropriate Use Criteria for Coronary Revascularization Focused Update: J Am Coll Cardiol. 2012;59(9):857-881. http://[email or web address]?articleid=[email or web address] These results will be called to the ordering clinician or representative by the Radiologist Assistant, and communication documented in the PACS or zVision Dashboard. Electronically Signed   By: Genevive Bi M.D.   On: 05/06/2015 15:19   05/07/15 Conclusion    1. Prox RCA lesion, 85% stenosed. 2. Dist RCA lesion, 100% stenosed. 3. SVG was injected is normal in caliber. 4. Origin lesion, 100% stenosed. 5. Ramus lesion, 80% stenosed. 6. LM lesion, 80% stenosed. 7. SVG was injected . 8. There is severe disease in the graft. 9. Ost Cx to Prox Cx lesion, 100% stenosed. 10. Ost 1st Mrg to 1st Mrg lesion, 100% stenosed. 11. SVG was injected is very large. 12. There is severe disease in the graft. 13. Dist Graft to Insertion  lesion, 95% stenosed. 14. 1st Mrg lesion, 99% stenosed. 15. Prox Graft to Mid Graft lesion, 70% stenosed. 16. Ost 2nd Diag lesion, 75% stenosed. 17. Ost 1st Diag lesion, 85% stenosed. 18. Mid LAD to Dist LAD lesion, 90% stenosed. 19. LIMA was injected is normal in caliber, and is anatomically normal.   Extremely difficult procedure due to accomplished from the right femoral approach due to femoral/iliac aneurysm, severe of vessel tortuosity, and calcification. These features severely limited catheter torque and movement.  Inability to enter the pulmonary artery due to inability to control catheter and absence of torque control.  Severe native vessel coronary disease with total occlusion of the distal right, 90% stenosis of the proximal right, total occlusion of circumflex, 80% distal left main (previously identified), 80% proximal LAD, and high-grade obstruction in each with 2 diagonal branches.  Severe bypass graft disease with total occlusion of the SVG to the right coronary (chronic). Diffuse saphenous vein graft disease  involving the SVG to the obtuse marginal with a 95% distal anastomosis stenosis and 40-70% stenosis throughout the mid body of the graft. The SVG to the ramus intermedius contains moderate diffuse disease with obstruction up to 50%.  Widely patent LIMA to LAD  Severe ischemic cardiomyopathy with EF 20-25%. There appears to be global hypokinesis. The degree of LV dysfunction is disproportionate to areas of potential ischemia which include the right coronary distribution and the distal circumflex.  Chronic kidney disease, III.  No evidence of pulmonary hypertension and low right heart filling pressures.  RECOMMENDATIONS:   The distal lesion in the SVG to obtuse marginal could be intervened upon. Even with distal protection and stenting, long-term patency of this graft is limited due to the severe diffuse disease throughout the graft. In the absence of angina or other  evidence of ischemia, a staged procedure on this saphenous vein graft is of questionable utility. However, intervention on this graft could be performed if it is felt to be clinically beneficial.  Monitored kidney function  Gentle hydration   The degree of LV dysfunction is disproportionate to areas of potential ischemia which include the right coronary distribution and the distal circumflex.    ASSESSMENT AND PLAN  1. Acute on chronic combined systolic and diastolic HF (EF 84-69%-->62-95%) - discharged in Nov with daily Lasix therapy 20 mg. However they were confused and did not take this as a did not realize this was a daily medication. - Echo 01/07/2015 EF 40-45%. Repeat echo 05/04/2015 EF 20-25%, grade 1 DD, mild LVH, mild MR.  - Given sudden drop in EF, he underwent myoview which returned abnormal. He underwent L/RHC yesterday which revealed severe native CAD as well as severe bypass graft disease with CTO of SVG-->RCA, diffuse dz SVG--> OM1 with a 95% distal anastomosis stenosis and 40-70% stenosis throughout the mid body of the graft., and mod dz of SBG --> RI. However, the degree of LV dysfunction is disproportionate to areas of potential ischemia  Unfortunately Dr. 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 His creat bumped after cath. Plan to follow this as the patient is gently hydrated  - Contunue coreg 6.25mg  BID and Lisinorpil 5mg     2. Elevated trop likely demand ischemia: 0.46--> 0.74.   3. CAD s/p CABG x4V (1999); s/p PCI dLAD (2004): R/LHC yesterday which revealed severe native CAD as well as severe bypass graft disease with CTO of SVG-->RCA, diffuse dz SVG--> OM1 with a 95% distal anastomosis stenosis and  40-70% stenosis throughout the mid body of the graft., and mod dz of SBG --> RI. Per Dr. Katrinka Blazing the  distal lesion in the SVG to OM could be intervened upon, but long term patency would be limited. In the absence of angina, he did not feel like this was neccessary.    4. AS s/p TAVR (12/2012)  5. PAF (felt to be an isolated event and not on long term AC)  6. Hyponatremia: baseline Na mid 120s. Dr. Myrtis Ser reviewed previous notes from discharge in 01/2015 where chronic hyponatremia was discussed. There was felt to be a component of SIDAH. Eventually during this hospitalization he was found to be volume depleted. Currently he is treated with IV normal saline. Before the patient goes home, careful decisions  will have to be made about home dosing of diuretics and very careful early follow-up to follow his volume status and his sodium.  7. CVA w/ residual L eye blindness  8. HTN  9. HLD  10. AKI: creat bumped 0.98--> 1.27 after cath. Continue to monitor.  Billy Fischer PA-C  Pager [phone] Patient seen and examined. I agree with the assessment and plan as detailed above. See also my additional thoughts below.   I have reviewed the assessment and plan above and I have modified it. When the patient's volume status, renal function, and sodium are stabilized, he can be discharged home with very early post hospital follow-up of his volume status/diuretic dosing/renal function/sodium concentration.  Willa Rough, MD, [facility] 05/08/2015 9:11 AM

## 2015-05-08 NOTE — Progress Notes (Signed)
Physical Therapy Treatment Patient Details Name: Hunter Choi MRN: [id number] DOB: 1928-01-04 Today's Date: 05/08/2015    History of Present Illness 82 year old male with a history of coronary artery disease, combined congestive heart failure, that presented to the emergency department with complaints of shortness of breath and chest pain    PT Comments    Pt refused to ambulate outside of room, so unable to practice stairs.  Pt will need 24/7 S and A with OOB activity.  If family cannot provide current level of A, then would need SNF.  If they can, then recommend HHPT.    Follow Up Recommendations  Home health PT;Supervision/Assistance - 24 hour;Supervision for mobility/OOB (if unable to provide 24 hour A may need ST SNF)     Equipment Recommendations  Other (comment) (TBD)    Recommendations for Other Services       Precautions / Restrictions Precautions Precautions: Fall Restrictions Weight Bearing Restrictions: No    Mobility  Bed Mobility Overal bed mobility: Needs Assistance Bed Mobility: Supine to Sit     Supine to sit: Min assist     General bed mobility comments: MIN A to get to EOB today.  Transfers Overall transfer level: Needs assistance Equipment used: Rolling walker (2 wheeled) Transfers: Sit to/from Stand Sit to Stand: Min assist         General transfer comment: multiple sit to stands from recliner after gait to adjust pads.  Cueing for hand placement.  Ambulation/Gait Ambulation/Gait assistance: Min assist;Min guard Ambulation Distance (Feet): 40 Feet Assistive device: Rolling walker (2 wheeled) Gait Pattern/deviations: Step-through pattern;Trunk flexed Gait velocity: decreaed   General Gait Details: Pt refused to ambulate in hallway today. MIN A required in tighter spaces in room and with turns, MIN/guard for the rest.   Stairs            Wheelchair Mobility    Modified Rankin (Stroke Patients Only)       Balance      Sitting balance-Leahy Scale: Fair       Standing balance-Leahy Scale: Poor                      Cognition Arousal/Alertness: Awake/alert Behavior During Therapy: Impulsive Overall Cognitive Status: No family/caregiver present to determine baseline cognitive functioning Area of Impairment: Orientation;Following commands;Safety/judgement;Awareness;Problem solving Orientation Level: Disoriented to;Situation   Memory: Decreased short-term memory Following Commands: Follows one step commands with increased time Safety/Judgement: Decreased awareness of safety;Decreased awareness of deficits Awareness: Intellectual Problem Solving: Slow processing;Requires tactile cues;Difficulty sequencing General Comments: On first attempt to see pt, he was laying in soiled linens and not aware and when asked, denied that he had a BM.     Exercises      General Comments General comments (skin integrity, edema, etc.): Pt with difficulty following instructions due to cognition and HOH.      Pertinent Vitals/Pain Pain Assessment: No/denies pain    Home Living                      Prior Function            PT Goals (current goals can now be found in the care plan section) Acute Rehab PT Goals Patient Stated Goal: none stated PT Goal Formulation: Patient unable to participate in goal setting Time For Goal Achievement: 05/18/15 Potential to Achieve Goals: Good Progress towards PT goals: Progressing toward goals    Frequency  Min 3X/week    PT  Plan Current plan remains appropriate    Co-evaluation             End of Session Equipment Utilized During Treatment: Gait belt Activity Tolerance: Patient tolerated treatment well Patient left: in chair;with call bell/phone within reach     Time: 1008-1028 PT Time Calculation (min) (ACUTE ONLY): 20 min  Charges:  $Gait Training: 8-22 mins                    G Codes:      Hunter Choi 05/08/2015, 11:06  AM

## 2015-05-08 NOTE — Progress Notes (Signed)
[facility] Progress Note    Assessment/ Plan:   Acute on Chronic Hyponatremia: Baseline Na 120-125 since 10/2014. Na 132 on admission >> 121 > 123 this AM.Assumed to be hypovolemic with low R heart filling pressures on cath 2/9. Did receive NS during cath yesterday prior to urine studies being drawn.   - Cortisol and TSH wnl - Repeat UOsm (492) > SOsm (259) and UNa 34 - Continue to hold Lasix in setting of hypovolemia and improvement in Na  CKD 2: Cr 1-1.2 at baseline in elderly man with low muscle mass. Slightly increased to 1.27 this AM after contrast load - Discontinued Lisinopril  Acute on chronic CHF: Per primary team. S/p L & RHC 05/07/15.  Chest pain: Per primary team.  Subjective:   Reports he is doing well.  Very hard of hearing so cannot answer questions as he cannot hear them asked.   Objective:   BP 118/45 mmHg  Pulse 63  Temp(Src) 98.7 F (37.1 C) (Oral)  Resp 16  Ht 5\' 7"  (1.702 m)  Wt 135 lb 11.2 oz (61.553 kg)  BMI 21.25 kg/m2  SpO2 99%  Intake/Output Summary (Last 24 hours) at 05/08/15 0850 Last data filed at 05/08/15 0600  Gross per 24 hour  Intake    120 ml  Output    580 ml  Net   -460 ml   Weight change:   Physical Exam: Gen: Laying comfortably in bed, NAD HEENT: MMM CV: RRR, no murmurs appreciated Pulm: Decreased breath sounds in bases, unable to appreciate any crackles Abd: Soft, NTND, + BS, pulsatile mass palpated Ext: No edema  Imaging: Nm Myocar Multi W/spect W/wall Motion / Ef  05/06/2015  CLINICAL DATA:  82 year old male with history short of breath, coronary artery disease. CABG in 1999. Stenting 2004. EXAM: MYOCARDIAL IMAGING WITH SPECT (REST AND PHARMACOLOGIC-STRESS) GATED LEFT VENTRICULAR WALL MOTION STUDY LEFT VENTRICULAR EJECTION FRACTION TECHNIQUE: Standard myocardial SPECT imaging was performed after resting intravenous injection of bowel mCi Tc-67m sestamibi. Subsequently, intravenous infusion of Lexiscan was  performed under the supervision of the Cardiology staff. At peak effect of the drug, 30 mCi Tc-24m sestamibi was injected intravenously and standard myocardial SPECT imaging was performed. Quantitative gated imaging was also performed to evaluate left ventricular wall motion, and estimate left ventricular ejection fraction. COMPARISON:  None. FINDINGS: Perfusion: Moderate size region of severe decrease counts within the apex which is fixed on the stress and rest. Mild decrease counts within the apical segment of anterior wall fixed on rest and stress. There is a moderate size region of mild profusion abnormality within the mid and basilar segments of the lateral wall which improves from stress to rest. Wall Motion: Global hypokinesia. Septal hypokinesia. LEFT ventricular dilatation Left Ventricular Ejection Fraction: 28 % End diastolic volume 170 ml End systolic volume 123 ml IMPRESSION: 1. Moderate region of ischemia within the mid and basilar segment of the lateral wall. Moderate-sized scar in the anterior apical wall and apex. 2.  LEFT ventricular dilatation with global hypokinesia. 3. Left ventricular ejection fraction 28% 4. High-risk stress test findings*. *2012 Appropriate Use Criteria for Coronary Revascularization Focused Update: J Am Coll Cardiol. 2012;59(9):857-881. http://[email or web address]=[email or web address] These results will be called to the ordering clinician or representative by the Radiologist Assistant, and communication documented in the PACS or zVision Dashboard. Electronically Signed   By: [email or web address] M.D.   On: 05/06/2015 15:19    Labs: BMET  Recent Labs Lab 05/03/15 0345 05/04/15 07/02/15 05/05/15 0435 05/06/15 07/04/15  05/07/15 0618 05/07/15 1519 05/07/15 1534 05/08/15 0420  NA 126* 125* 122* 124* 121*  --  123* 123*  K 4.1 4.4 4.0 4.2 4.1  --  4.8 4.4  CL 88* 88* 85* 84* 85*  --  86* 86*  CO2 26 28 27 30 28   --  26 27  GLUCOSE 90 93 97 99 94  --  104* 81   BUN 17 18 21* 20 26*  --  21* 23*  CREATININE 1.02 1.14 1.11 1.14 1.19 0.99 0.98 1.27*  CALCIUM 8.2* 8.3* 8.2* 8.7* 8.3*  --  8.5* 8.4*  PHOS  --   --   --   --   --   --  3.7 4.3   CBC  Recent Labs Lab 05/01/15 1343 05/06/15 0615 05/07/15 1519  WBC 9.4 6.7 8.2  HGB 12.5* 11.6* 12.0*  HCT 39.2 34.4* 36.2*  MCV 93.6 90.1 88.7  PLT 172 142* 141*    Medications:    . amLODipine  2.5 mg Oral Q1500  . aspirin EC  81 mg Oral Daily  . carvedilol  6.25 mg Oral BID WC  . enoxaparin (LOVENOX) injection  30 mg Subcutaneous Q24H  . folic acid  3 mg Oral Daily  . insulin aspart  0-15 Units Subcutaneous TID WC  . insulin aspart  0-5 Units Subcutaneous QHS  . levothyroxine  50 mcg Oral QAC breakfast  . methotrexate  25 mg Oral Weekly  . pantoprazole  40 mg Oral Daily  . polyethylene glycol  17 g Oral BID  . simvastatin  20 mg Oral QPM  . sodium chloride flush  3 mL Intravenous Q12H  . sodium chloride flush  3 mL Intravenous Q12H     07/05/15, MD, MPH PGY-2,  [facility] 05/08/2015 8:50 AM I have seen and examined this patient and agree with plan per Dr 07/06/2015.  He has had hyponatremia for some time and most likely at baseline has a reset osmostat, along his CHF.   Currently not volume overloaded but he has 2 cups of water and cup of OJ in front of him?  At least for now he should be fluid restricted to 1L/ day.  Follow SNa but given his age and other health issues I think hyponatremia is the least of his problems .  Resume lasix when appropriate for volume status. Farin Buhman T,MD 05/08/2015 10:46 AM

## 2015-05-08 NOTE — Progress Notes (Addendum)
[facility]                                                                              Patient Demographics  Hunter Choi, is a 82 y.o. male, DOB - 06-01-1927, DGL:[id number]  Admit date - 05/01/2015   Admitting Physician Edsel Petrin, DO  Outpatient Primary MD for the patient is Shamleffer, Landry Mellow, MD  LOS - 7   Chief Complaint  Patient presents with  . Shortness of Breath  . Chest Pain       Brief HPI   Hunter Choi is a very pleasant 82 y.o. male with a past medical history that includes CAD, hypertension, hyperlipidemia, chronic hyponatremia chronic thrombocytopenia chronic combined heart failure, bioprosthetic aortic valve presented to ED with dyspnea and chest pain. Patient's family reported reported a 3 day history of gradually worsening shortness of breath. Associated symptoms include intermittent left anterior chest pain. He denied any cough lower extremity edema or orthopnea. He denies palpitations diaphoresis nausea vomiting abdominal pain. He denies dysuria hematuria frequency or urgency. Family does indicate that he has not been taking his Lasix that was prescribed at last hospitalization as they did not understand it was a daily medication. In the emergency department he is afebrile hemodynamically stable and not hypoxic. He is given 40 mg of Lasix intravenously   Assessment & Plan   Acute on chronic combined heart failure. Echo from October 2016 mild LVH. EF 40-45% was diffuse hypokinesis, grade 1 diastolic dysfunction,  -BNP over 4500, Patient was placed on IV Lasix.  Negative balance of 2.4 L  -was on ACE inhibitor now held -2-D echo showed EF of 20-25%, dropped from prior echo in 10/16, EF was 40-45%, stress test also abnormal, s/p LHC 2/9 with severe native and graft disease -lasix on hold now   Chest pain/CAD/CABG - No chest pain at this time - 2-D echo showed EF of 20-25% with hypokinesis diffuse, grade 1 diastolic dysfunction,  prior echo in 10/16 had shown EF of 40-45%. -Stress test with moderate region of ischemia within the mid and basilar segment of lateral wall, high risk, EF 28%  - Cardiac cath with severe diffuse disease -continue ASA/Coreg/statin   Abdominal aortic aneurysm without rupture. He is surveilled every 6 months by Dr. Edilia Bo. Also has history of aortic valve disease, status post TAVR in 10/14, follows Dr. Verdis Prime  Hyponatremia.  acute on Chronic hyponatremia, has chronically low sodium - Sodium was trending  down to 121, patient has been on aggressive diuresis with no significant improvement  - was on lasix, this was held, Renal consulting, Na 123 today - lasix dose to be determined per Renal   Rheumatoid arthritis. Stable at baseline -Continue home meds -Physical therapy-> HPT  Hyperglycemia. -hemoglobin A1c 6.0 c/w Pre-DM -Sliding scale insulin  Code Status: Full code Family Communication: None at bedside, d/w pt Disposition Plan: Home in 1-2days , if Na/kidney function stable  Time Spent in minutes 25 minutes  Procedures  2-D echo Nuclear medicine stress test Cardiac cath  Consults   Cardiology Renal, Dr. Briant Cedar   DVT Prophylaxis  Lovenox   Medications  Scheduled Meds: . amLODipine  2.5 mg Oral Q1500  . aspirin EC  81 mg Oral Daily  . carvedilol  6.25 mg Oral BID WC  . enoxaparin (LOVENOX) injection  30 mg Subcutaneous Q24H  . folic acid  3 mg Oral Daily  . insulin aspart  0-15 Units Subcutaneous TID WC  . insulin aspart  0-5 Units Subcutaneous QHS  . levothyroxine  50 mcg Oral QAC breakfast  . methotrexate  25 mg Oral Weekly  . pantoprazole  40 mg Oral Daily  . polyethylene glycol  17 g Oral BID  . simvastatin  20 mg Oral QPM  . sodium chloride flush  3 mL Intravenous Q12H  . sodium chloride flush  3 mL Intravenous Q12H   Continuous Infusions:   PRN Meds:.sodium chloride, acetaminophen **OR** acetaminophen, acetaminophen, ondansetron **OR** ondansetron  (ZOFRAN) IV, oxyCODONE-acetaminophen, sodium chloride flush, sodium chloride flush   Antibiotics   Anti-infectives    None        Subjective:   Feels ok, no issues overnight  Objective:   Blood pressure 95/56, pulse 57, temperature 98.2 F (36.8 C), temperature source Oral, resp. rate 18, height 5\' 7"  (1.702 m), weight 61.553 kg (135 lb 11.2 oz), SpO2 100 %.  Wt Readings from Last 3 Encounters:  05/08/15 61.553 kg (135 lb 11.2 oz)  02/03/15 60.2 kg (132 lb 11.5 oz)  01/14/15 68.493 kg (151 lb)     Intake/Output Summary (Last 24 hours) at 05/08/15 1406 Last data filed at 05/08/15 0600  Gross per 24 hour  Intake    120 ml  Output    380 ml  Net   -260 ml    Exam  General: Alert and oriented x 3, NAD  HEENT:  PERRLA, EOMI   Neck: Supple, no JVD, no masses  CVS: S1 S2 auscultated, 1/6 systolic murmur   Respiratory: Decreased breath sounds at the bases  Abdomen: Soft, NT, ND, NBS  Ext: no cyanosis clubbing or edema  Neuro:  no new deficits  skin: No rashes  Psych: Normal affect and demeanor, alert and oriented x3    Data Review   Micro Results No results found for this or any previous visit (from the past 240 hour(s)).  Radiology Reports Dg Chest 2 View  05/01/2015  CLINICAL DATA:  Chest pain and weakness for 2 days EXAM: CHEST  2 VIEW COMPARISON:  11/14/2014 FINDINGS: Previous median sternotomy and CABG procedure. Aortic atherosclerosis noted. There is mild cardiac enlargement. Bilateral pleural effusions are present in areas mild to moderate interstitial edema consistent with CHF. Atelectasis is noted in the lung bases. IMPRESSION: 1. Congestive heart failure. 2. Decreased aeration to the lung bases. Electronically Signed   By: Signa Kell M.D.   On: 05/01/2015 14:05   Nm Myocar Multi W/spect W/wall Motion / Ef  05/06/2015  CLINICAL DATA:  82 year old male with history short of breath, coronary artery disease. CABG in 1999. Stenting 2004. EXAM:  MYOCARDIAL IMAGING WITH SPECT (REST AND PHARMACOLOGIC-STRESS) GATED LEFT VENTRICULAR WALL MOTION STUDY LEFT VENTRICULAR EJECTION FRACTION TECHNIQUE: Standard myocardial SPECT imaging was performed after resting intravenous injection of bowel mCi Tc-17m sestamibi. Subsequently, intravenous infusion of Lexiscan was performed under the supervision of the Cardiology staff. At peak effect of the drug, 30 mCi Tc-75m sestamibi was injected intravenously and standard myocardial SPECT imaging was performed. Quantitative gated imaging was also performed to evaluate left ventricular wall motion, and estimate left ventricular ejection fraction. COMPARISON:  None. FINDINGS: Perfusion: Moderate size region  of severe decrease counts within the apex which is fixed on the stress and rest. Mild decrease counts within the apical segment of anterior wall fixed on rest and stress. There is a moderate size region of mild profusion abnormality within the mid and basilar segments of the lateral wall which improves from stress to rest. Wall Motion: Global hypokinesia. Septal hypokinesia. LEFT ventricular dilatation Left Ventricular Ejection Fraction: 28 % End diastolic volume 170 ml End systolic volume 123 ml IMPRESSION: 1. Moderate region of ischemia within the mid and basilar segment of the lateral wall. Moderate-sized scar in the anterior apical wall and apex. 2.  LEFT ventricular dilatation with global hypokinesia. 3. Left ventricular ejection fraction 28% 4. High-risk stress test findings*. *2012 Appropriate Use Criteria for Coronary Revascularization Focused Update: J Am Coll Cardiol. 2012;59(9):857-881. http://content.dementiazones.com.aspx?articleid=1201161 These results will be called to the ordering clinician or representative by the Radiologist Assistant, and communication documented in the PACS or zVision Dashboard. Electronically Signed   By: Genevive Bi M.D.   On: 05/06/2015 15:19    CBC  Recent Labs Lab  05/06/15 0615 05/07/15 1519  WBC 6.7 8.2  HGB 11.6* 12.0*  HCT 34.4* 36.2*  PLT 142* 141*  MCV 90.1 88.7  MCH 30.4 29.4  MCHC 33.7 33.1  RDW 16.4* 16.2*    Chemistries   Recent Labs Lab 05/05/15 0435 05/06/15 0615 05/07/15 0618 05/07/15 1519 05/07/15 1534 05/08/15 0420  NA 122* 124* 121*  --  123* 123*  K 4.0 4.2 4.1  --  4.8 4.4  CL 85* 84* 85*  --  86* 86*  CO2 27 30 28   --  26 27  GLUCOSE 97 99 94  --  104* 81  BUN 21* 20 26*  --  21* 23*  CREATININE 1.11 1.14 1.19 0.99 0.98 1.27*  CALCIUM 8.2* 8.7* 8.3*  --  8.5* 8.4*   ------------------------------------------------------------------------------------------------------------------ estimated creatinine clearance is 37.1 mL/min (by C-G formula based on Cr of 1.27). ------------------------------------------------------------------------------------------------------------------ No results for input(s): HGBA1C in the last 72 hours. ------------------------------------------------------------------------------------------------------------------ No results for input(s): CHOL, HDL, LDLCALC, TRIG, CHOLHDL, LDLDIRECT in the last 72 hours. ------------------------------------------------------------------------------------------------------------------  Recent Labs  05/07/15 1534  TSH 3.050   ------------------------------------------------------------------------------------------------------------------ No results for input(s): VITAMINB12, FOLATE, FERRITIN, TIBC, IRON, RETICCTPCT in the last 72 hours.  Coagulation profile  Recent Labs Lab 05/07/15 0910  INR 1.14    No results for input(s): DDIMER in the last 72 hours.  Cardiac Enzymes  Recent Labs Lab 05/01/15 2046 05/02/15 0443  TROPONINI 0.46* 0.74*   ------------------------------------------------------------------------------------------------------------------ Invalid input(s): POCBNP   Recent Labs  05/07/15 0609 05/07/15 1112  05/07/15 1546 05/07/15 2115 05/08/15 0609 05/08/15 1154  GLUCAP 102* 97 84 110* 83 12804/10/17 M.D. [facility] 05/08/2015, 2:06 PM  Pager: [phone] Between 7am to 7pm - call Pager - [phone]  After 7pm go to [email or web address] - password [email or web address]  Call night coverage person covering after 7pm

## 2015-05-09 DIAGNOSIS — I714 Abdominal aortic aneurysm, without rupture: Secondary | ICD-10-CM

## 2015-05-09 LAB — CBC
HEMATOCRIT: 40.3 % (ref 39.0–52.0)
Hemoglobin: 13.5 g/dL (ref 13.0–17.0)
MCH: 30.1 pg (ref 26.0–34.0)
MCHC: 33.5 g/dL (ref 30.0–36.0)
MCV: 90 fL (ref 78.0–100.0)
Platelets: 84 10*3/uL — ABNORMAL LOW (ref 150–400)
RBC: 4.48 MIL/uL (ref 4.22–5.81)
RDW: 16.6 % — AB (ref 11.5–15.5)
WBC: 10.6 10*3/uL — ABNORMAL HIGH (ref 4.0–10.5)

## 2015-05-09 LAB — BASIC METABOLIC PANEL
Anion gap: 10 (ref 5–15)
BUN: 33 mg/dL — AB (ref 6–20)
CHLORIDE: 87 mmol/L — AB (ref 101–111)
CO2: 25 mmol/L (ref 22–32)
CREATININE: 1.5 mg/dL — AB (ref 0.61–1.24)
Calcium: 8.1 mg/dL — ABNORMAL LOW (ref 8.9–10.3)
GFR calc Af Amer: 47 mL/min — ABNORMAL LOW (ref >60)
GFR calc non Af Amer: 41 mL/min — ABNORMAL LOW (ref >60)
GLUCOSE: 88 mg/dL (ref 65–99)
POTASSIUM: 4.3 mmol/L (ref 3.5–5.1)
Sodium: 122 mmol/L — ABNORMAL LOW (ref 135–145)

## 2015-05-09 LAB — GLUCOSE, CAPILLARY
GLUCOSE-CAPILLARY: 105 mg/dL — AB (ref 65–99)
Glucose-Capillary: 79 mg/dL (ref 65–99)
Glucose-Capillary: 122 mg/dL — ABNORMAL HIGH (ref 65–99)
Glucose-Capillary: 107 mg/dL — ABNORMAL HIGH (ref 65–99)

## 2015-05-09 NOTE — Progress Notes (Signed)
Appears stable from CV standpoint Volume managed by nephrology  Cardiology to see as needed over the weekend Please call with questions  Hillis Range MD, [facility] 05/09/2015 2:40 PM

## 2015-05-09 NOTE — Progress Notes (Signed)
[facility] Progress Note    Assessment/ Plan:   Acute on Chronic Hyponatremia: Baseline Na 120-125 since 10/2014. Na 132 on admission >> 121 > 123>122 this AM.Assumed to be hypovolemic with low R heart filling pressures on cath 2/9. Did receive NS during cath yesterday prior to urine studies being drawn. Most likely a combination of reset osmostat and CHF.  Not keeping strict I&Os.  - Cortisol and TSH wnl  - Repeat UOsm (492) > SOsm (259) and UNa 34 - Continue to fluid restrict  CKD 2: Cr 1-1.2 at baseline in elderly man with low muscle mass. Slightly increased from 1.27> 1.50 this AM after contrast load - Discontinued Lisinopril - Continue to monitor for recovery  Acute on chronic CHF: Per primary team. S/p L & RHC 05/07/15.   Chest pain: Per primary team.  Subjective:   Reports he is doing well. Difficult to communicate due to hearing impairment.    Objective:   BP 119/60 mmHg  Pulse 56  Temp(Src) 98 F (36.7 C) (Oral)  Resp 18  Ht 5\' 7"  (1.702 m)  Wt 134 lb 0.6 oz (60.8 kg)  BMI 20.99 kg/m2  SpO2 94%  Intake/Output Summary (Last 24 hours) at 05/09/15 0828 Last data filed at 05/09/15 0600  Gross per 24 hour  Intake    480 ml  Output      0 ml  Net    480 ml   Weight change: -1 lb 10.6 oz (-0.753 kg)  Physical Exam: Gen: Laying comfortably in bed, NAD HEENT: MMM CV: RRR, no murmurs appreciated Pulm: Decreased breath sounds in bases, unable to appreciate any crackles Abd: Soft, NTND, + BS.  Ext: No edema noted  Imaging: No results found.  Labs: BMET  Recent Labs Lab 05/04/15 0449 05/05/15 0435 05/06/15 0615 05/07/15 0618 05/07/15 1519 05/07/15 1534 05/08/15 0420 05/09/15 0331  NA 125* 122* 124* 121*  --  123* 123* 122*  K 4.4 4.0 4.2 4.1  --  4.8 4.4 4.3  CL 88* 85* 84* 85*  --  86* 86* 87*  CO2 28 27 30 28   --  26 27 25   GLUCOSE 93 97 99 94  --  104* 81 88  BUN 18 21* 20 26*  --  21* 23* 33*  CREATININE 1.14 1.11 1.14 1.19 0.99  0.98 1.27* 1.50*  CALCIUM 8.3* 8.2* 8.7* 8.3*  --  8.5* 8.4* 8.1*  PHOS  --   --   --   --   --  3.7 4.3  --    CBC  Recent Labs Lab 05/06/15 0615 05/07/15 1519 05/09/15 0331  WBC 6.7 8.2 10.6*  HGB 11.6* 12.0* 13.5  HCT 34.4* 36.2* 40.3  MCV 90.1 88.7 90.0  PLT 142* 141* 84*    Medications:    . amLODipine  2.5 mg Oral Q1500  . aspirin EC  81 mg Oral Daily  . carvedilol  6.25 mg Oral BID WC  . enoxaparin (LOVENOX) injection  30 mg Subcutaneous Q24H  . folic acid  3 mg Oral Daily  . insulin aspart  0-15 Units Subcutaneous TID WC  . insulin aspart  0-5 Units Subcutaneous QHS  . levothyroxine  50 mcg Oral QAC breakfast  . methotrexate  25 mg Oral Weekly  . pantoprazole  40 mg Oral Daily  . polyethylene glycol  17 g Oral BID  . simvastatin  20 mg Oral QPM  . sodium chloride flush  3 mL Intravenous Q12H  .  sodium chloride flush  3 mL Intravenous Q12H     Joanna Puff, MD, MPH PGY-2,  [facility] 05/09/2015 8:28 AM I have seen and examined this patient and agree with plan per Dr Leonides Schanz.  His I/O's are incomplete and you might consider moving him to a floor that can properly record UO.  Scr up slightly post cath, cont to follow.  Pt asymptomatic, cont fluid restriction assuming he is on one.  Recheck labs in AM.  Will most likely resume lasix in AM. Kael Keetch T,MD 05/09/2015 9:17 AM

## 2015-05-09 NOTE — Progress Notes (Signed)
[facility]                                                                              Patient Demographics  Hunter Choi, is a 82 y.o. male, DOB - 1927/06/23, YSA:[id number]  Admit date - 05/01/2015   Admitting Physician Edsel Petrin, DO  Outpatient Primary MD for the patient is Shamleffer, Landry Mellow, MD  LOS - 8   Chief Complaint  Patient presents with  . Shortness of Breath  . Chest Pain       Brief HPI   Hunter Choi is a very pleasant 82 y.o. male with a past medical history that includes CAD, hypertension, hyperlipidemia, chronic hyponatremia chronic thrombocytopenia chronic combined heart failure, bioprosthetic aortic valve presented to ED with dyspnea and chest pain. Patient's family reported reported a 3 day history of gradually worsening shortness of breath. Associated symptoms include intermittent left anterior chest pain. He denied any cough lower extremity edema or orthopnea. He denies palpitations diaphoresis nausea vomiting abdominal pain. He denies dysuria hematuria frequency or urgency. Family does indicate that he has not been taking his Lasix that was prescribed at last hospitalization as they did not understand it was a daily medication.   Assessment & Plan   Acute on chronic combined heart failure. Echo from October 2016 mild LVH. EF 40-45% was diffuse hypokinesis, grade 1 diastolic dysfunction,  -BNP over 4500, Patient was placed on IV Lasix.  Negative balance of 2.4 L  -was on ACE inhibitor now held -2-D echo showed EF of 20-25%, dropped from prior echo in 10/16, EF was 40-45%, stress test also abnormal, s/p LHC 2/9 with severe native and graft disease -lasix on hold now, some crackles in lungs today, ? Resume low dose PO lasix, will defer to Renal -continue Fluid restriction, do not expect much improvement clinically   Chest pain/CAD/CABG - No chest pain at this time - 2-D echo showed EF of 20-25% with hypokinesis diffuse, grade  1 diastolic dysfunction, prior echo in 10/16 had shown EF of 40-45%. -Stress test with moderate region of ischemia within the mid and basilar segment of lateral wall, high risk, EF 28%  - Cardiac cath with severe diffuse disease -continue ASA/Coreg/statin   Abdominal aortic aneurysm without rupture. He is surveilled every 6 months by Dr. Edilia Bo. Also has history of aortic valve disease, status post TAVR in 10/14, follows Dr. Verdis Prime  Hyponatremia.  acute on Chronic hyponatremia, has chronically low sodium -baseline in 120-126 range, due to CHF and Reset thermostat per Renal - Sodium was trending  down to 121, patient has been on aggressive diuresis with no significant improvement  - was on lasix, this was held, Renal consulting, Na 121 today - lasix dose to be determined per Renal   Rheumatoid arthritis. Stable at baseline -Continue home meds -Physical therapy-> HPT  Hyperglycemia. -hemoglobin A1c 6.0 c/w Pre-DM -Sliding scale insulin  Code Status: Full code Family Communication: None at bedside, d/w pt Disposition Plan: Home tomorrow if Na/kidney function stable  Time Spent in minutes 25 minutes  Procedures  2-D echo Nuclear medicine stress test Cardiac cath  Consults  Cardiology Renal, Dr. Briant Cedar   DVT Prophylaxis  Lovenox   Medications  Scheduled Meds: . amLODipine  2.5 mg Oral Q1500  . aspirin EC  81 mg Oral Daily  . carvedilol  6.25 mg Oral BID WC  . enoxaparin (LOVENOX) injection  30 mg Subcutaneous Q24H  . folic acid  3 mg Oral Daily  . insulin aspart  0-15 Units Subcutaneous TID WC  . insulin aspart  0-5 Units Subcutaneous QHS  . levothyroxine  50 mcg Oral QAC breakfast  . methotrexate  25 mg Oral Weekly  . pantoprazole  40 mg Oral Daily  . polyethylene glycol  17 g Oral BID  . simvastatin  20 mg Oral QPM  . sodium chloride flush  3 mL Intravenous Q12H  . sodium chloride flush  3 mL Intravenous Q12H   Continuous Infusions:   PRN Meds:.sodium  chloride, acetaminophen **OR** acetaminophen, acetaminophen, ondansetron **OR** ondansetron (ZOFRAN) IV, oxyCODONE-acetaminophen, sodium chloride flush, sodium chloride flush   Antibiotics   Anti-infectives    None        Subjective:   Feels ok, no issues overnight  Objective:   Blood pressure 114/44, pulse 55, temperature 98.6 F (37 C), temperature source Oral, resp. rate 20, height 5\' 7"  (1.702 m), weight 60.8 kg (134 lb 0.6 oz), SpO2 99 %.  Wt Readings from Last 3 Encounters:  05/09/15 60.8 kg (134 lb 0.6 oz)  02/03/15 60.2 kg (132 lb 11.5 oz)  01/14/15 68.493 kg (151 lb)     Intake/Output Summary (Last 24 hours) at 05/09/15 1240 Last data filed at 05/09/15 1013  Gross per 24 hour  Intake    723 ml  Output      0 ml  Net    723 ml    Exam  General: Alert and oriented x 3, NAD  HEENT:  PERRLA, EOMI   Neck: Supple, no JVD, no masses  CVS: S1 S2 auscultated, 1/6 systolic murmur   Respiratory: Decreased breath sounds at the bases  Abdomen: Soft, NT, ND, NBS  Ext: no cyanosis clubbing or edema  Neuro:  no new deficits  skin: No rashes  Psych: Normal affect and demeanor, alert and oriented x3    Data Review   Micro Results No results found for this or any previous visit (from the past 240 hour(s)).  Radiology Reports Dg Chest 2 View  05/01/2015  CLINICAL DATA:  Chest pain and weakness for 2 days EXAM: CHEST  2 VIEW COMPARISON:  11/14/2014 FINDINGS: Previous median sternotomy and CABG procedure. Aortic atherosclerosis noted. There is mild cardiac enlargement. Bilateral pleural effusions are present in areas mild to moderate interstitial edema consistent with CHF. Atelectasis is noted in the lung bases. IMPRESSION: 1. Congestive heart failure. 2. Decreased aeration to the lung bases. Electronically Signed   By: 11/16/2014 M.D.   On: 05/01/2015 14:05   Nm Myocar Multi W/spect W/wall Motion / Ef  05/06/2015  CLINICAL DATA:  82 year old male with history  short of breath, coronary artery disease. CABG in 1999. Stenting 2004. EXAM: MYOCARDIAL IMAGING WITH SPECT (REST AND PHARMACOLOGIC-STRESS) GATED LEFT VENTRICULAR WALL MOTION STUDY LEFT VENTRICULAR EJECTION FRACTION TECHNIQUE: Standard myocardial SPECT imaging was performed after resting intravenous injection of bowel mCi Tc-56m sestamibi. Subsequently, intravenous infusion of Lexiscan was performed under the supervision of the Cardiology staff. At peak effect of the drug, 30 mCi Tc-63m sestamibi was injected intravenously and standard myocardial SPECT imaging was performed. Quantitative gated imaging was also performed to evaluate left  ventricular wall motion, and estimate left ventricular ejection fraction. COMPARISON:  None. FINDINGS: Perfusion: Moderate size region of severe decrease counts within the apex which is fixed on the stress and rest. Mild decrease counts within the apical segment of anterior wall fixed on rest and stress. There is a moderate size region of mild profusion abnormality within the mid and basilar segments of the lateral wall which improves from stress to rest. Wall Motion: Global hypokinesia. Septal hypokinesia. LEFT ventricular dilatation Left Ventricular Ejection Fraction: 28 % End diastolic volume 170 ml End systolic volume 123 ml IMPRESSION: 1. Moderate region of ischemia within the mid and basilar segment of the lateral wall. Moderate-sized scar in the anterior apical wall and apex. 2.  LEFT ventricular dilatation with global hypokinesia. 3. Left ventricular ejection fraction 28% 4. High-risk stress test findings*. *2012 Appropriate Use Criteria for Coronary Revascularization Focused Update: J Am Coll Cardiol. 2012;59(9):857-881. http://content.dementiazones.com.aspx?articleid=1201161 These results will be called to the ordering clinician or representative by the Radiologist Assistant, and communication documented in the PACS or zVision Dashboard. Electronically Signed   By:  Genevive Bi M.D.   On: 05/06/2015 15:19    CBC  Recent Labs Lab 05/06/15 0615 05/07/15 1519 05/09/15 0331  WBC 6.7 8.2 10.6*  HGB 11.6* 12.0* 13.5  HCT 34.4* 36.2* 40.3  PLT 142* 141* 84*  MCV 90.1 88.7 90.0  MCH 30.4 29.4 30.1  MCHC 33.7 33.1 33.5  RDW 16.4* 16.2* 16.6*    Chemistries   Recent Labs Lab 05/06/15 0615 05/07/15 0618 05/07/15 1519 05/07/15 1534 05/08/15 0420 05/09/15 0331  NA 124* 121*  --  123* 123* 122*  K 4.2 4.1  --  4.8 4.4 4.3  CL 84* 85*  --  86* 86* 87*  CO2 30 28  --  26 27 25   GLUCOSE 99 94  --  104* 81 88  BUN 20 26*  --  21* 23* 33*  CREATININE 1.14 1.19 0.99 0.98 1.27* 1.50*  CALCIUM 8.7* 8.3*  --  8.5* 8.4* 8.1*   ------------------------------------------------------------------------------------------------------------------ estimated creatinine clearance is 31 mL/min (by C-G formula based on Cr of 1.5). ------------------------------------------------------------------------------------------------------------------ No results for input(s): HGBA1C in the last 72 hours. ------------------------------------------------------------------------------------------------------------------ No results for input(s): CHOL, HDL, LDLCALC, TRIG, CHOLHDL, LDLDIRECT in the last 72 hours. ------------------------------------------------------------------------------------------------------------------  Recent Labs  05/07/15 1534  TSH 3.050   ------------------------------------------------------------------------------------------------------------------ No results for input(s): VITAMINB12, FOLATE, FERRITIN, TIBC, IRON, RETICCTPCT in the last 72 hours.  Coagulation profile  Recent Labs Lab 05/07/15 0910  INR 1.14    No results for input(s): DDIMER in the last 72 hours.  Cardiac Enzymes No results for input(s): CKMB, TROPONINI, MYOGLOBIN in the last 168 hours.  Invalid input(s):  CK ------------------------------------------------------------------------------------------------------------------ Invalid input(s): POCBNP   Recent Labs  05/08/15 0609 05/08/15 1154 05/08/15 1624 05/08/15 2100 05/09/15 0614 05/09/15 1132  GLUCAP 83 128* 121* 108* 79 12204/11/17 M.D. [facility] 05/09/2015, 12:40 PM  Pager: 07/07/2015 Between 7am to 7pm - call Pager - [phone]  After 7pm go to [email or web address] - password [email or web address]  Call night coverage person covering after 7pm

## 2015-05-10 DIAGNOSIS — R739 Hyperglycemia, unspecified: Secondary | ICD-10-CM

## 2015-05-10 DIAGNOSIS — K219 Gastro-esophageal reflux disease without esophagitis: Secondary | ICD-10-CM

## 2015-05-10 LAB — GLUCOSE, CAPILLARY
GLUCOSE-CAPILLARY: 107 mg/dL — AB (ref 65–99)
Glucose-Capillary: 103 mg/dL — ABNORMAL HIGH (ref 65–99)
Glucose-Capillary: 107 mg/dL — ABNORMAL HIGH (ref 65–99)
Glucose-Capillary: 98 mg/dL (ref 65–99)

## 2015-05-10 LAB — RENAL FUNCTION PANEL
ALBUMIN: 2.7 g/dL — AB (ref 3.5–5.0)
Anion gap: 10 (ref 5–15)
BUN: 33 mg/dL — AB (ref 6–20)
CHLORIDE: 88 mmol/L — AB (ref 101–111)
CO2: 23 mmol/L (ref 22–32)
Calcium: 8.1 mg/dL — ABNORMAL LOW (ref 8.9–10.3)
Creatinine, Ser: 1.19 mg/dL (ref 0.61–1.24)
GFR calc Af Amer: >60 mL/min (ref >60)
GFR, EST NON AFRICAN AMERICAN: 54 mL/min — AB (ref >60)
GLUCOSE: 100 mg/dL — AB (ref 65–99)
PHOSPHORUS: 3.3 mg/dL (ref 2.5–4.6)
POTASSIUM: 4.4 mmol/L (ref 3.5–5.1)
SODIUM: 121 mmol/L — AB (ref 135–145)

## 2015-05-10 MED ORDER — FUROSEMIDE 40 MG PO TABS
40.0000 mg | ORAL_TABLET | Freq: Two times a day (BID) | ORAL | Status: DC
  Administered 2015-05-10 – 2015-05-11 (×3): 40 mg via ORAL
  Filled 2015-05-10 (×3): qty 1

## 2015-05-10 NOTE — Progress Notes (Signed)
[facility] Progress Note    Assessment/ Plan:   Acute on Chronic Hyponatremia: Baseline Na 120-125 since 10/2014. Na 132 on admission >> 121 > 123>122 > 121 this AM.Assumed to be hypovolemic with low R heart filling pressures on cath 2/9.  Most likely a combination of reset osmostat and CHF.  Not keeping strict I&Os.  - Cortisol and TSH wnl  - Repeat UOsm (492) > SOsm (259) and UNa 34 - Continue to fluid restrict - Volume up on exam, resume lasix BID  CKD 2: Cr 1-1.2 at baseline in elderly man with low muscle mass. Slightly increased from 1.27> 1.50 s/p contrast load for cath, now 1.19 (at baseline) - Discontinued Lisinopril  Acute on chronic CHF: Per primary team. S/p L & RHC 05/07/15.   Chest pain: Per primary team.  Subjective:   Reports he is doing well and wants to go back to sleep. Difficult to communicate due to hearing impairment.    Objective:   BP 128/57 mmHg  Pulse 63  Temp(Src) 99.7 F (37.6 C) (Oral)  Resp 18  Ht 5\' 7"  (1.702 m)  Wt 136 lb 3.2 oz (61.78 kg)  BMI 21.33 kg/m2  SpO2 98%  Intake/Output Summary (Last 24 hours) at 05/10/15 0759 Last data filed at 05/10/15 0600  Gross per 24 hour  Intake    963 ml  Output    700 ml  Net    263 ml   Weight change: 2 lb 2.6 oz (0.98 kg)  Physical Exam: Gen: Laying comfortably in bed, sleeping, NAD HEENT: MMM CV: RRR, no murmurs appreciated Pulm: Fine crackles in bases Abd: Soft, NTND, + BS.  Ext: No edema noted  Imaging: No results found.  Labs: BMET  Recent Labs Lab 05/05/15 0435 05/06/15 0615 05/07/15 0618 05/07/15 1519 05/07/15 1534 05/08/15 0420 05/09/15 0331 05/10/15 0344  NA 122* 124* 121*  --  123* 123* 122* 121*  K 4.0 4.2 4.1  --  4.8 4.4 4.3 4.4  CL 85* 84* 85*  --  86* 86* 87* 88*  CO2 27 30 28   --  26 27 25 23   GLUCOSE 97 99 94  --  104* 81 88 100*  BUN 21* 20 26*  --  21* 23* 33* 33*  CREATININE 1.11 1.14 1.19 0.99 0.98 1.27* 1.50* 1.19  CALCIUM 8.2* 8.7* 8.3*   --  8.5* 8.4* 8.1* 8.1*  PHOS  --   --   --   --  3.7 4.3  --  3.3   CBC  Recent Labs Lab 05/06/15 0615 05/07/15 1519 05/09/15 0331  WBC 6.7 8.2 10.6*  HGB 11.6* 12.0* 13.5  HCT 34.4* 36.2* 40.3  MCV 90.1 88.7 90.0  PLT 142* 141* 84*    Medications:    . amLODipine  2.5 mg Oral Q1500  . aspirin EC  81 mg Oral Daily  . carvedilol  6.25 mg Oral BID WC  . enoxaparin (LOVENOX) injection  30 mg Subcutaneous Q24H  . folic acid  3 mg Oral Daily  . insulin aspart  0-15 Units Subcutaneous TID WC  . insulin aspart  0-5 Units Subcutaneous QHS  . levothyroxine  50 mcg Oral QAC breakfast  . methotrexate  25 mg Oral Weekly  . pantoprazole  40 mg Oral Daily  . polyethylene glycol  17 g Oral BID  . simvastatin  20 mg Oral QPM  . sodium chloride flush  3 mL Intravenous Q12H  . sodium chloride flush  3  mL Intravenous Q12H     Hunter Downer, MD, MPH PGY-2,  [facility] 05/10/2015 7:59 AM  I have seen and examined this patient and agree with plan per Dr Beryle Flock.  SNa fractionally lower.  Resume lasix at 40mg  BID.  I think we all need to accept the fact that he will have lower Sna that he tolerates well. Hunter Choi T,MD 05/10/2015 9:36 AM

## 2015-05-10 NOTE — Progress Notes (Signed)
[facility]                                                                              Patient Demographics  Hunter Choi, is a 82 y.o. male, DOB - 10/09/1927, EGB:[id number]  Admit date - 05/01/2015   Admitting Physician Edsel Petrin, DO  Outpatient Primary MD for the patient is Shamleffer, Landry Mellow, MD  LOS - 9   Chief Complaint  Patient presents with  . Shortness of Breath  . Chest Pain       Brief HPI   Hunter Choi is a very pleasant 82 y.o. male with a past medical history that includes CAD, hypertension, hyperlipidemia, chronic hyponatremia chronic thrombocytopenia chronic combined heart failure, bioprosthetic aortic valve presented to ED with dyspnea and chest pain. Patient's family reported reported a 3 day history of gradually worsening shortness of breath. Associated symptoms include intermittent left anterior chest pain. He denied any cough lower extremity edema or orthopnea. He denies palpitations diaphoresis nausea vomiting abdominal pain. He denies dysuria hematuria frequency or urgency. Family does indicate that he has not been taking his Lasix that was prescribed at last hospitalization as they did not understand it was a daily medication.   Assessment & Plan   Acute on chronic combined heart failure. Echo from October 2016 mild LVH. EF 40-45% was diffuse hypokinesis, grade 1 diastolic dysfunction,  -BNP over 4500, Patient was placed on IV Lasix.  Negative balance of 2.4 L  -was on ACE inhibitor now held -2-D echo showed EF of 20-25%, dropped from prior echo in 10/16, EF was 40-45%, stress test also abnormal, s/p LHC 2/9 with severe native and graft disease -lasix then held due to worsening Na, Resume low dose PO lasix,  -continue Fluid restriction, do not expect much improvement in Na-THIS is his baseline   Chest pain/CAD/CABG - No chest pain at this time - 2-D echo showed EF of 20-25% with hypokinesis diffuse, grade 1 diastolic  dysfunction, prior echo in 10/16 had shown EF of 40-45%. -Stress test with moderate region of ischemia within the mid and basilar segment of lateral wall, high risk, EF 28%  - Cardiac cath with severe diffuse disease -continue ASA/Coreg/statin   Abdominal aortic aneurysm without rupture. He is surveilled every 6 months by Dr. Edilia Bo. Also has history of aortic valve disease, status post TAVR in 10/14, follows Dr. Verdis Prime  Hyponatremia.  acute on Chronic hyponatremia, has chronically low sodium -baseline in 120-126 range, due to CHF and Reset thermostat per Renal - Sodium was trending  down to 121, patient has been on aggressive diuresis with no significant improvement  - was on lasix, this was held, Renal consulting, Na 121 now which is likely his baseline - lasix to be resumed today   Rheumatoid arthritis. Stable at baseline -Continue home meds -Physical therapy-> HPT  Hyperglycemia. -hemoglobin A1c 6.0 c/w Pre-DM -Sliding scale insulin  Code Status: Full code Family Communication: None at bedside, d/w pt Disposition Plan: Home tomorrow if Na/kidney function stable  Time Spent in minutes 25 minutes  Procedures  2-D echo Nuclear medicine stress test Cardiac cath  Consults  Cardiology Renal, Dr. Briant Cedar   DVT Prophylaxis  Lovenox   Medications  Scheduled Meds: . amLODipine  2.5 mg Oral Q1500  . aspirin EC  81 mg Oral Daily  . carvedilol  6.25 mg Oral BID WC  . enoxaparin (LOVENOX) injection  30 mg Subcutaneous Q24H  . folic acid  3 mg Oral Daily  . furosemide  40 mg Oral BID  . insulin aspart  0-15 Units Subcutaneous TID WC  . insulin aspart  0-5 Units Subcutaneous QHS  . levothyroxine  50 mcg Oral QAC breakfast  . methotrexate  25 mg Oral Weekly  . pantoprazole  40 mg Oral Daily  . polyethylene glycol  17 g Oral BID  . simvastatin  20 mg Oral QPM  . sodium chloride flush  3 mL Intravenous Q12H  . sodium chloride flush  3 mL Intravenous Q12H    Continuous Infusions:   PRN Meds:.sodium chloride, acetaminophen **OR** acetaminophen, acetaminophen, ondansetron **OR** ondansetron (ZOFRAN) IV, oxyCODONE-acetaminophen, sodium chloride flush, sodium chloride flush   Antibiotics   Anti-infectives    None        Subjective:   Feels ok, no issues overnight  Objective:   Blood pressure 111/48, pulse 66, temperature 99.4 F (37.4 C), temperature source Oral, resp. rate 18, height 5\' 7"  (1.702 m), weight 61.78 kg (136 lb 3.2 oz), SpO2 96 %.  Wt Readings from Last 3 Encounters:  05/10/15 61.78 kg (136 lb 3.2 oz)  02/03/15 60.2 kg (132 lb 11.5 oz)  01/14/15 68.493 kg (151 lb)     Intake/Output Summary (Last 24 hours) at 05/10/15 1154 Last data filed at 05/10/15 0944  Gross per 24 hour  Intake    963 ml  Output    900 ml  Net     63 ml    Exam  General: Alert and oriented x 3, NAD  HEENT:  PERRLA, EOMI   Neck: Supple, no JVD, no masses  CVS: S1 S2 auscultated, 1/6 systolic murmur   Respiratory:trace basilar crackles  Abdomen: Soft, NT, ND, NBS  Ext: no cyanosis clubbing or edema  Neuro:  no new deficits  skin: No rashes  Psych: Normal affect and demeanor, alert and oriented x3    Data Review   Micro Results No results found for this or any previous visit (from the past 240 hour(s)).  Radiology Reports Dg Chest 2 View  05/01/2015  CLINICAL DATA:  Chest pain and weakness for 2 days EXAM: CHEST  2 VIEW COMPARISON:  11/14/2014 FINDINGS: Previous median sternotomy and CABG procedure. Aortic atherosclerosis noted. There is mild cardiac enlargement. Bilateral pleural effusions are present in areas mild to moderate interstitial edema consistent with CHF. Atelectasis is noted in the lung bases. IMPRESSION: 1. Congestive heart failure. 2. Decreased aeration to the lung bases. Electronically Signed   By: Signa Kell M.D.   On: 05/01/2015 14:05   Nm Myocar Multi W/spect W/wall Motion / Ef  05/06/2015  CLINICAL  DATA:  82 year old male with history short of breath, coronary artery disease. CABG in 1999. Stenting 2004. EXAM: MYOCARDIAL IMAGING WITH SPECT (REST AND PHARMACOLOGIC-STRESS) GATED LEFT VENTRICULAR WALL MOTION STUDY LEFT VENTRICULAR EJECTION FRACTION TECHNIQUE: Standard myocardial SPECT imaging was performed after resting intravenous injection of bowel mCi Tc-21m sestamibi. Subsequently, intravenous infusion of Lexiscan was performed under the supervision of the Cardiology staff. At peak effect of the drug, 30 mCi Tc-58m sestamibi was injected intravenously and standard myocardial SPECT imaging was performed. Quantitative gated imaging was also performed  to evaluate left ventricular wall motion, and estimate left ventricular ejection fraction. COMPARISON:  None. FINDINGS: Perfusion: Moderate size region of severe decrease counts within the apex which is fixed on the stress and rest. Mild decrease counts within the apical segment of anterior wall fixed on rest and stress. There is a moderate size region of mild profusion abnormality within the mid and basilar segments of the lateral wall which improves from stress to rest. Wall Motion: Global hypokinesia. Septal hypokinesia. LEFT ventricular dilatation Left Ventricular Ejection Fraction: 28 % End diastolic volume 170 ml End systolic volume 123 ml IMPRESSION: 1. Moderate region of ischemia within the mid and basilar segment of the lateral wall. Moderate-sized scar in the anterior apical wall and apex. 2.  LEFT ventricular dilatation with global hypokinesia. 3. Left ventricular ejection fraction 28% 4. High-risk stress test findings*. *2012 Appropriate Use Criteria for Coronary Revascularization Focused Update: J Am Coll Cardiol. 2012;59(9):857-881. http://content.dementiazones.com.aspx?articleid=1201161 These results will be called to the ordering clinician or representative by the Radiologist Assistant, and communication documented in the PACS or zVision  Dashboard. Electronically Signed   By: Genevive Bi M.D.   On: 05/06/2015 15:19    CBC  Recent Labs Lab 05/06/15 0615 05/07/15 1519 05/09/15 0331  WBC 6.7 8.2 10.6*  HGB 11.6* 12.0* 13.5  HCT 34.4* 36.2* 40.3  PLT 142* 141* 84*  MCV 90.1 88.7 90.0  MCH 30.4 29.4 30.1  MCHC 33.7 33.1 33.5  RDW 16.4* 16.2* 16.6*    Chemistries   Recent Labs Lab 05/07/15 0618 05/07/15 1519 05/07/15 1534 05/08/15 0420 05/09/15 0331 05/10/15 0344  NA 121*  --  123* 123* 122* 121*  K 4.1  --  4.8 4.4 4.3 4.4  CL 85*  --  86* 86* 87* 88*  CO2 28  --  26 27 25 23   GLUCOSE 94  --  104* 81 88 100*  BUN 26*  --  21* 23* 33* 33*  CREATININE 1.19 0.99 0.98 1.27* 1.50* 1.19  CALCIUM 8.3*  --  8.5* 8.4* 8.1* 8.1*   ------------------------------------------------------------------------------------------------------------------ estimated creatinine clearance is 39.7 mL/min (by C-G formula based on Cr of 1.19). ------------------------------------------------------------------------------------------------------------------ No results for input(s): HGBA1C in the last 72 hours. ------------------------------------------------------------------------------------------------------------------ No results for input(s): CHOL, HDL, LDLCALC, TRIG, CHOLHDL, LDLDIRECT in the last 72 hours. ------------------------------------------------------------------------------------------------------------------  Recent Labs  05/07/15 1534  TSH 3.050   ------------------------------------------------------------------------------------------------------------------ No results for input(s): VITAMINB12, FOLATE, FERRITIN, TIBC, IRON, RETICCTPCT in the last 72 hours.  Coagulation profile  Recent Labs Lab 05/07/15 0910  INR 1.14    No results for input(s): DDIMER in the last 72 hours.  Cardiac Enzymes No results for input(s): CKMB, TROPONINI, MYOGLOBIN in the last 168 hours.  Invalid input(s):  CK ------------------------------------------------------------------------------------------------------------------ Invalid input(s): POCBNP   Recent Labs  05/08/15 2100 05/09/15 0614 05/09/15 1132 05/09/15 1648 05/09/15 2053 05/10/15 0629  GLUCAP 108* 79 122* 107* 105* 10304/12/17 M.D. [facility] 05/10/2015, 11:54 AM  Pager: [phone] Between 7am to 7pm - call Pager - [phone]  After 7pm go to [email or web address] - password [email or web address]  Call night coverage person covering after 7pm

## 2015-05-11 LAB — CBC
HEMATOCRIT: 32.6 % — AB (ref 39.0–52.0)
HEMOGLOBIN: 10.6 g/dL — AB (ref 13.0–17.0)
MCH: 29.3 pg (ref 26.0–34.0)
MCHC: 32.5 g/dL (ref 30.0–36.0)
MCV: 90.1 fL (ref 78.0–100.0)
Platelets: 132 10*3/uL — ABNORMAL LOW (ref 150–400)
RBC: 3.62 MIL/uL — AB (ref 4.22–5.81)
RDW: 16.4 % — ABNORMAL HIGH (ref 11.5–15.5)
WBC: 5.9 10*3/uL (ref 4.0–10.5)

## 2015-05-11 LAB — RENAL FUNCTION PANEL
ANION GAP: 12 (ref 5–15)
Albumin: 2.7 g/dL — ABNORMAL LOW (ref 3.5–5.0)
BUN: 27 mg/dL — ABNORMAL HIGH (ref 6–20)
CHLORIDE: 86 mmol/L — AB (ref 101–111)
CO2: 25 mmol/L (ref 22–32)
Calcium: 8.2 mg/dL — ABNORMAL LOW (ref 8.9–10.3)
Creatinine, Ser: 1.06 mg/dL (ref 0.61–1.24)
GFR calc non Af Amer: >60 mL/min (ref >60)
Glucose, Bld: 89 mg/dL (ref 65–99)
POTASSIUM: 4 mmol/L (ref 3.5–5.1)
Phosphorus: 3.8 mg/dL (ref 2.5–4.6)
Sodium: 123 mmol/L — ABNORMAL LOW (ref 135–145)

## 2015-05-11 LAB — GLUCOSE, CAPILLARY
Glucose-Capillary: 76 mg/dL (ref 65–99)
Glucose-Capillary: 141 mg/dL — ABNORMAL HIGH (ref 65–99)

## 2015-05-11 MED ORDER — CARVEDILOL 6.25 MG PO TABS: 6.2500 mg | ORAL_TABLET | Freq: Two times a day (BID) | ORAL | Status: DC

## 2015-05-11 MED ORDER — FUROSEMIDE 20 MG PO TABS: 40.0000 mg | ORAL_TABLET | Freq: Every day | ORAL | Status: DC

## 2015-05-11 NOTE — Care Management Important Message (Signed)
Important Message  Patient Details  Name: MAJESTIC BRISTER MRN: [id number] Date of Birth: [birth year]   Medicare Important Message Given:  Yes    Muneer Leider P Hadlie Gipson 05/11/2015, 1:38 PM

## 2015-05-11 NOTE — Care Management Note (Addendum)
Case Management Note  Patient Details  Name: Hunter Choi MRN: [id number] Date of Birth: 10-Oct-1927  Subjective/Objective:   Admitted with CHF                 Action/Plan: Patient lives at home with his spouse and adult son. His wife recently had a stroke but patient states that she is fairly active. His son takes him to his appointments and other activities. Patient ( very HOH) could benefit from [facility] services, patient stated " I do not want it right now because I have had it and I am finished with it, maybe in about 3 months I will do it again but I need a break." CM informed patient that if he wanted [facility] services after discharge to call his primary care physician and the arrangements can be made from the office.  Expected Discharge Date:    possibly 05/07/2015              Expected Discharge Plan:  Home/Self Care  Discharge planning Services  CM Consult   Choice offered to:  Patient  HH Arranged:  Patient Refused   Status of Service:  Completed, signed off  Medicare Important Message Given:  Yes  Hunter Choi [phone] 05/11/2015, 9:58 AM CM assessed pt prior to discharge, pt is extremely HOH.  Pt informed CM that he is never alone at home and that his wife or his son is always with him.  Pt informed CM that already has walker and cane at home and is able to ambulate with the assistance of said equipment. Pt wishes to return home and continues to refuse recommended HH.  CM talked with son Hunter Choi and he confirmed that pt will have 24 hour supervision at home post discharge

## 2015-05-11 NOTE — Progress Notes (Signed)
PT Cancellation Note  Patient Details Name: [name] MRN: [id number] DOB: 07/12/1927   Cancelled Treatment:    Reason Eval/Treat Not Completed: Patient declined, no reason specified. Attempted to see pt to assess ambulation with rollator, as this is what he has at home. Pt refusing PT at this time saying he wants to nap.  Pt is scheduled to dc home today and educated pt on importance of therapy, but pt still declined.  Will check back as schedule permits   Frans Valente LUBECK 05/11/2015, 10:33 AM

## 2015-05-11 NOTE — Progress Notes (Signed)
Occupational Therapy Evaluation Patient Details Name: Hunter Choi MRN: [id number] DOB: 1927/06/30 Today's Date: 05/11/2015    History of Present Illness 82 year old male with a history of coronary artery disease, combined congestive heart failure, that presented to the emergency department with complaints of shortness of breath and chest pain   Clinical Impression   Pt states he was mod I with ADL and mobility PTA. At this time, pt requires minguard A to close S with ADL. VC for safety and task completion. Pt will most likely perform better in his familiar environment, however, recommend follow up with HHOT to facilitate return to PLOF and reduce risk of falls within the home.     Follow Up Recommendations  Home health OT;Supervision/Assistance - 24 hour    Equipment Recommendations  None recommended by OT    Recommendations for Other Services       Precautions / Restrictions Precautions Precautions: Fall      Mobility Bed Mobility Overal bed mobility: Modified Independent (heavy use of bed rails)                Transfers Overall transfer level: Needs assistance Equipment used: 4-wheeled walker Transfers: Sit to/from Stand Sit to Stand: Supervision Stand pivot transfers: Supervision       General transfer comment: increased time. Uses momentum    Balance Overall balance assessment: Needs assistance   Sitting balance-Leahy Scale: Good       Standing balance-Leahy Scale: Fair                              ADL Overall ADL's : Needs assistance/impaired                                     Functional mobility during ADLs: Supervision/safety (rollator) General ADL Comments: Pt requires close S with ADL. States he completes his ADL independently at home. Pt will most likely perform better in his familiar environment but need S with all ADL.   Educated pt on need to have someone with him at all times during ADL and mobility . Pt  verbalized understanding.      Vision     Perception     Praxis      Pertinent Vitals/Pain Pain Assessment: No/denies pain     Hand Dominance     Extremity/Trunk Assessment Upper Extremity Assessment Upper Extremity Assessment: Generalized weakness (deformities from RA)       Cervical / Trunk Assessment Cervical / Trunk Assessment: Kyphotic   Communication Communication Communication: HOH   Cognition Arousal/Alertness: Awake/alert Behavior During Therapy: Impulsive Overall Cognitive Status: No family/caregiver present to determine baseline cognitive functioning       Memory: Decreased short-term memory Following Commands: Follows one step commands with increased time Safety/Judgement: Decreased awareness of safety;Decreased awareness of deficits Awareness: Emergent Problem Solving: Slow processing General Comments: unaware incontinent of urine.    General Comments       Exercises       Shoulder Instructions      Home Living Family/patient expects to be discharged to:: Private residence Living Arrangements: Spouse/significant other;Children Available Help at Discharge: Family;Available 24 hours/day Type of Home: House Home Access: Stairs to enter [employer] of Steps: 3 Entrance Stairs-Rails: Right;Left Home Layout: One level     Bathroom Shower/Tub: Walk-in shower (unsure? due to [facility])   Bathroom Toilet: Standard  Home Equipment: Walker - 4 wheels;Cane - single point;Shower seat          Prior Functioning/Environment Level of Independence: Independent with assistive device(s)        Comments: PTA pt used walker at all times, denies need for assist w/ ambulation and ADLs however,k question reliability as historian    OT Diagnosis: Generalized weakness   OT Problem List: Decreased strength;Decreased activity tolerance;Impaired balance (sitting and/or standing);Decreased safety awareness   OT Treatment/Interventions:       OT Goals(Current goals can be found in the care plan section) Acute Rehab OT Goals Patient Stated Goal: to go home OT Goal Formulation: All assessment and education complete, DC therapy  OT Frequency:     Barriers to D/C:            Co-evaluation              End of Session Equipment Utilized During Treatment: Rolling walker Nurse Communication: Mobility status  Activity Tolerance: Patient tolerated treatment well Patient left: in bed;with call bell/phone within reach;with nursing/sitter in room   Time: 1040-1057 OT Time Calculation (min): 17 min Charges:  OT General Charges $OT Visit: 1 Procedure OT Evaluation $OT Eval Moderate Complexity: 1 Procedure G-Codes:    Vipul Cafarelli,HILLARY 06/05/2015, 11:08 AM   Luisa Dago, OTR/L  [phone] 06-05-2015

## 2015-05-11 NOTE — Progress Notes (Signed)
[facility] Progress Note    Assessment/ Plan:   Acute on Chronic Hyponatremia: Baseline Na 120-125 since 10/2014. Na 132 on admission >> 121 > 123>122 > 121 >123 this AM.Assumed to be hypovolemic with low R heart filling pressures on cath 2/9.  Most likely a combination of reset osmostat and CHF. Improved UOP.  - Cortisol and TSH wnl  - Repeat UOsm (492) > SOsm (259) and UNa 34 - Continue to fluid restrict - Continue lasix 40mg  PO BID  CKD 2: Cr 1-1.2 at baseline in elderly man with low muscle mass. Slightly increased from 1.27> 1.50 s/p contrast load for cath, now 1.19 > 1.06 (at baseline) - Holding Lisinopril  Acute on chronic CHF: Per primary team. S/p L & RHC 05/07/15.   Chest pain: Per primary team.  Subjective:   Reports he is doing well and wants to go home. Difficult to communicate due to hearing impairment.   Objective:   BP 115/60 mmHg  Pulse 59  Temp(Src) 97.9 F (36.6 C) (Oral)  Resp 17  Ht 5\' 7"  (1.702 m)  Wt 129 lb 9.6 oz (58.786 kg)  BMI 20.29 kg/m2  SpO2 96%  Intake/Output Summary (Last 24 hours) at 05/11/15 0858 Last data filed at 05/11/15 0615  Gross per 24 hour  Intake    963 ml  Output   1075 ml  Net   -112 ml   Weight change:   Physical Exam: Gen: Sitting comfortably in bed, eating breakfast, NAD HEENT: MMM CV: RRR, no murmurs appreciated Pulm: Fine crackles in bases Abd: Soft, NTND, + BS.  Ext: No edema noted  Imaging: No results found.  Labs: BMET  Recent Labs Lab 05/06/15 0615 05/07/15 0618 05/07/15 1519 05/07/15 1534 05/08/15 0420 05/09/15 0331 05/10/15 0344 05/11/15 0344  NA 124* 121*  --  123* 123* 122* 121* 123*  K 4.2 4.1  --  4.8 4.4 4.3 4.4 4.0  CL 84* 85*  --  86* 86* 87* 88* 86*  CO2 30 28  --  26 27 25 23 25   GLUCOSE 99 94  --  104* 81 88 100* 89  BUN 20 26*  --  21* 23* 33* 33* 27*  CREATININE 1.14 1.19 0.99 0.98 1.27* 1.50* 1.19 1.06  CALCIUM 8.7* 8.3*  --  8.5* 8.4* 8.1* 8.1* 8.2*  PHOS  --    --   --  3.7 4.3  --  3.3 3.8   CBC  Recent Labs Lab 05/06/15 0615 05/07/15 1519 05/09/15 0331 05/11/15 0344  WBC 6.7 8.2 10.6* 5.9  HGB 11.6* 12.0* 13.5 10.6*  HCT 34.4* 36.2* 40.3 32.6*  MCV 90.1 88.7 90.0 90.1  PLT 142* 141* 84* 132*    Medications:    . amLODipine  2.5 mg Oral Q1500  . aspirin EC  81 mg Oral Daily  . carvedilol  6.25 mg Oral BID WC  . folic acid  3 mg Oral Daily  . furosemide  40 mg Oral BID  . insulin aspart  0-15 Units Subcutaneous TID WC  . insulin aspart  0-5 Units Subcutaneous QHS  . levothyroxine  50 mcg Oral QAC breakfast  . methotrexate  25 mg Oral Weekly  . pantoprazole  40 mg Oral Daily  . polyethylene glycol  17 g Oral BID  . simvastatin  20 mg Oral QPM  . sodium chloride flush  3 mL Intravenous Q12H  . sodium chloride flush  3 mL Intravenous Q12H     07/05/15  Berton Bon, MD, MPH PGY-2,  [facility] 05/11/2015 8:58 AM   Renal Attending: Renal fct improved and hyponatremia at baseline and chronic. Kadra Kohan C

## 2015-05-11 NOTE — Discharge Summary (Signed)
Pt got discharged to home, discharge instructions provided and patient showed understanding to it, IV taken out,Telemonitor DC,pt left unit in wheelchair with all of the belongings accompanied with a family member (Son) 

## 2015-05-11 NOTE — Discharge Summary (Signed)
Physician Discharge Summary  Hunter Choi ZOX:[id number] DOB: 05-Aug-1927 DOA: 05/01/2015  PCP: Tommy Rainwater, MD  Admit date: 05/01/2015 Discharge date: 05/11/2015  Time spent: 45 minutes  Recommendations for Outpatient Follow-up:  1. [facility] 2/20 at 10:30am with Cline Crock 2. Please check Bmet at FU, Baseline Sodium is 120-125 3. PCP Dr.Shamletter in 1-2weeks please continue Discussions on Goals of Care/Hospice etc   Discharge Diagnoses:  Principal Problem:   Acute on Systolic chronic heart failure (HCC)   EF is 20-25%   Chronic Hyponatremia-BASELINE Na is 120-125   Rheumatoid arthritis (HCC)   GERD (gastroesophageal reflux disease)   Thyroid disease   BPH (benign prostatic hypertrophy)   AAA (abdominal aortic aneurysm) without rupture (HCC)   Arterial hypotension   Hyponatremia   Chest pain at rest   Hyperglycemia   Hard of hearing   LV dysfunction   Coronary artery disease involving coronary bypass graft of native heart   Discharge Condition: stable  Diet recommendation: Low sodium, heart healthy  Filed Weights   05/09/15 0432 05/10/15 0410 05/11/15 0704  Weight: 60.8 kg (134 lb 0.6 oz) 61.78 kg (136 lb 3.2 oz) 58.786 kg (129 lb 9.6 oz)    History of present illness:  Chief Complaint: dyspnea/chest pain HPI: Hunter Choi is a very pleasant 82 y.o. male with a past medical history that includes CAD, hypertension, hyperlipidemia, chronic hyponatremia chronic thrombocytopenia chronic combined heart failure, bioprosthetic aortic valve presents to the emergency department with a chief complaint of dyspnea and chest pain. Initial evaluation in the emergency department reveals acute on chronic combined heart failure, mildly elevated troponin mild hyponatremia.  Hospital Course:   Acute on chronic combined heart failure. Echo from October 2016 mild LVH. EF 40-45% was diffuse hypokinesis, grade 1 diastolic dysfunction,  -BNP over 4500, Patient  was placed on IV Lasix. Negative balance of 2.4 L  -was on ACE inhibitor now held -2-D echo showed EF of 20-25%, dropped from prior echo in 10/16, EF was 40-45%, stress test also abnormal, s/p LHC 2/9 with severe native and graft disease -lasix then held due to worsening Na, Resume low dose PO lasix, at 40mg  BID -continue Fluid restriction, do not expect much improvement in Na-THIS is his baseline -will discharge him on 40mg  daily and have labs repeated at FU on 2/20 -i called and discussed with wife regarding worsening CHF, EF, recurrent hospitalizations etc and recommended to consider Hospice as and when he continues to deteriorate  Chest pain/CAD/CABG - No chest pain at this time - 2-D echo showed EF of 20-25% with hypokinesis diffuse, grade 1 diastolic dysfunction, prior echo in 10/16 had shown EF of 40-45%. -Stress test with moderate region of ischemia within the mid and basilar segment of lateral wall, high risk, EF 28%  - Cardiac cath with severe diffuse disease -continue ASA/Coreg/statin  Abdominal aortic aneurysm without rupture. He is surveilled every 6 months by Dr. 3/20. Also has history of aortic valve disease, status post TAVR in 10/14, follows Dr. Edilia Bo  Hyponatremia. acute on Chronic hyponatremia, has chronically low sodium -baseline in 120-126 range, due to CHF and Reset thermostat per Renal - Sodium was trending down to 121, patient has been on aggressive diuresis with no significant improvement  - was on lasix, this was held, Renal consulted, Na 121 now which is his baseline - lasix PO resumed 2/12, Na 123 at discharge today  Rheumatoid arthritis. Stable at baseline -Continue Prednisone 5mg  -Physical therapy-> HPT  Hyperglycemia. -hemoglobin A1c 6.0 c/w Pre-DM -Sliding scale insulin used inpatient  Consultations:  Cardiology  Renal  Discharge Exam: Filed Vitals:   05/10/15 2135 05/11/15 0704  BP: 110/50 115/60  Pulse: 55 59  Temp: 98.5  F (36.9 C) 97.9 F (36.6 C)  Resp: 18 17    General: AAOx3 Cardiovascular: S1S2/RRR Respiratory: CTAB  Discharge Instructions   Discharge Instructions    Diet - low sodium heart healthy    Complete by:  As directed      Increase activity slowly    Complete by:  As directed           Current Discharge Medication List    START taking these medications   Details  carvedilol (COREG) 6.25 MG tablet Take 1 tablet (6.25 mg total) by mouth 2 (two) times daily with a meal. Qty: 60 tablet, Refills: 0      CONTINUE these medications which have CHANGED   Details  furosemide (LASIX) 20 MG tablet Take 2 tablets (40 mg total) by mouth daily. Qty: 60 tablet, Refills: 0      CONTINUE these medications which have NOT CHANGED   Details  acetaminophen (TYLENOL) 325 MG tablet Take 2 tablets (650 mg total) by mouth every 6 (six) hours as needed for mild pain, moderate pain, fever or headache.    amLODipine (NORVASC) 5 MG tablet Take 0.5 tablets (2.5 mg total) by mouth daily at 3 pm.    aspirin EC 81 MG EC tablet Take 1 tablet (81 mg total) by mouth daily.    folic acid (FOLVITE) 1 MG tablet Take 3 mg by mouth daily.    levothyroxine (SYNTHROID, LEVOTHROID) 50 MCG tablet Take 50 mcg by mouth daily. Refills: 0    methotrexate 2.5 MG tablet Take 25 mg by mouth every Friday.     omeprazole (PRILOSEC) 20 MG capsule Take 20 mg by mouth daily.    polyethylene glycol (MIRALAX / GLYCOLAX) packet Take 17 g by mouth 2 (two) times daily. Qty: 14 each, Refills: 0    predniSONE (DELTASONE) 5 MG tablet Take 5 mg by mouth daily. Refills: 3    simvastatin (ZOCOR) 20 MG tablet Take 20 mg by mouth every evening. Refills: 12      STOP taking these medications     lisinopril (PRINIVIL,ZESTRIL) 2.5 MG tablet      metoprolol tartrate (LOPRESSOR) 25 MG tablet        Allergies  Allergen Reactions  . Ciprofloxacin Other (See Comments)    REACTION: mild delirium   Follow-up Information     Follow up with Janetta Hora, PA-C On 05/12/2015.   Specialties:  Cardiology, Radiology   Why:  11am   Contact information:   [address] [place] [address] [phone]        The results of significant diagnostics from this hospitalization (including imaging, microbiology, ancillary and laboratory) are listed below for reference.    Significant Diagnostic Studies: Dg Chest 2 View  05/01/2015  CLINICAL DATA:  Chest pain and weakness for 2 days EXAM: CHEST  2 VIEW COMPARISON:  11/14/2014 FINDINGS: Previous median sternotomy and CABG procedure. Aortic atherosclerosis noted. There is mild cardiac enlargement. Bilateral pleural effusions are present in areas mild to moderate interstitial edema consistent with CHF. Atelectasis is noted in the lung bases. IMPRESSION: 1. Congestive heart failure. 2. Decreased aeration to the lung bases. Electronically Signed   By: Signa Kell M.D.   On: 05/01/2015 14:05  Nm Myocar Multi W/spect W/wall Motion / Ef  05/06/2015  CLINICAL DATA:  82 year old male with history short of breath, coronary artery disease. CABG in 1999. Stenting 2004. EXAM: MYOCARDIAL IMAGING WITH SPECT (REST AND PHARMACOLOGIC-STRESS) GATED LEFT VENTRICULAR WALL MOTION STUDY LEFT VENTRICULAR EJECTION FRACTION TECHNIQUE: Standard myocardial SPECT imaging was performed after resting intravenous injection of bowel mCi Tc-54m sestamibi. Subsequently, intravenous infusion of Lexiscan was performed under the supervision of the Cardiology staff. At peak effect of the drug, 30 mCi Tc-88m sestamibi was injected intravenously and standard myocardial SPECT imaging was performed. Quantitative gated imaging was also performed to evaluate left ventricular wall motion, and estimate left ventricular ejection fraction. COMPARISON:  None. FINDINGS: Perfusion: Moderate size region of severe decrease counts within the apex which is fixed on the stress and rest. Mild decrease counts within  the apical segment of anterior wall fixed on rest and stress. There is a moderate size region of mild profusion abnormality within the mid and basilar segments of the lateral wall which improves from stress to rest. Wall Motion: Global hypokinesia. Septal hypokinesia. LEFT ventricular dilatation Left Ventricular Ejection Fraction: 28 % End diastolic volume 170 ml End systolic volume 123 ml IMPRESSION: 1. Moderate region of ischemia within the mid and basilar segment of the lateral wall. Moderate-sized scar in the anterior apical wall and apex. 2.  LEFT ventricular dilatation with global hypokinesia. 3. Left ventricular ejection fraction 28% 4. High-risk stress test findings*. *2012 Appropriate Use Criteria for Coronary Revascularization Focused Update: J Am Coll Cardiol. 2012;59(9):857-881. http://content.dementiazones.com.aspx?articleid=1201161 These results will be called to the ordering clinician or representative by the Radiologist Assistant, and communication documented in the PACS or zVision Dashboard. Electronically Signed   By: Genevive Bi M.D.   On: 05/06/2015 15:19    Microbiology: No results found for this or any previous visit (from the past 240 hour(s)).   Labs: Basic Metabolic Panel:  Recent Labs Lab 05/07/15 1534 05/08/15 0420 05/09/15 0331 05/10/15 0344 05/11/15 0344  NA 123* 123* 122* 121* 123*  K 4.8 4.4 4.3 4.4 4.0  CL 86* 86* 87* 88* 86*  CO2 26 27 25 23 25   GLUCOSE 104* 81 88 100* 89  BUN 21* 23* 33* 33* 27*  CREATININE 0.98 1.27* 1.50* 1.19 1.06  CALCIUM 8.5* 8.4* 8.1* 8.1* 8.2*  PHOS 3.7 4.3  --  3.3 3.8   Liver Function Tests:  Recent Labs Lab 05/07/15 1534 05/08/15 0420 05/10/15 0344 05/11/15 0344  ALBUMIN 3.1* 2.9* 2.7* 2.7*   No results for input(s): LIPASE, AMYLASE in the last 168 hours. No results for input(s): AMMONIA in the last 168 hours. CBC:  Recent Labs Lab 05/06/15 0615 05/07/15 1519 05/09/15 0331 05/11/15 0344  WBC 6.7 8.2  10.6* 5.9  HGB 11.6* 12.0* 13.5 10.6*  HCT 34.4* 36.2* 40.3 32.6*  MCV 90.1 88.7 90.0 90.1  PLT 142* 141* 84* 132*   Cardiac Enzymes: No results for input(s): CKTOTAL, CKMB, CKMBINDEX, TROPONINI in the last 168 hours. BNP: BNP (last 3 results)  Recent Labs  05/01/15 1343  BNP >4500.0*    ProBNP (last 3 results) No results for input(s): PROBNP in the last 8760 hours.  CBG:  Recent Labs Lab 05/10/15 0629 05/10/15 1151 05/10/15 1635 05/10/15 2119 05/11/15 0621  GLUCAP 103* 107* 107* 98 76       Signed:  Yohance Hathorne MD.  [facility] 05/11/2015, 9:50 AM

## 2015-05-11 NOTE — Progress Notes (Signed)
PT Cancellation Note  Patient Details Name: Hunter Choi MRN: [id number] DOB: 1927/11/23   Cancelled Treatment:    Reason Eval/Treat Not Completed: Patient declined, no reason specified. Attempted to see pt after OT, as he had been agreeable to work with her.  He declined, stating he just worked with her and didn't want to get up again. Attempted again to help pt get up for lunch, but continued to refuse.  Recommend HHPT and 24 hour S.  Will check back tomorrow if pt does not discharge today.   Jamaury Gumz LUBECK 05/11/2015, 12:40 PM

## 2015-05-12 ENCOUNTER — Encounter: Payer: Medicare Other | Admitting: Physician Assistant

## 2015-05-14 NOTE — Progress Notes (Signed)
Cardiology Office Note   Date:  05/18/2015   ID:  Hunter Choi, DOB 06-17-1927, MRN [id number]  PCP:  Tommy Rainwater, MD  Cardiologist: Dr. Katrinka Blazing   Post hospital follow-up   History of Present Illness: Hunter Choi is a 82 y.o. 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  Last seen by Dr. Katrinka Blazing in 04/2014 and doing well from a cardiology standpoint. Last [facility] in 2014 for TAVR showed patent bypass grafts.   He was admitted and 10/2014 for acute cholecystitis and sent home after tube placement and antibiotics. He was readmitted in 01/2015 for laparoscopic cholecystectomy. Discharge delayed secondary to worsening hyponatremia. At that time he was discharged with daily Lasix therapy 20 mg. However they were confused and did not take this as a did not realize this was a daily medication.  He presented to [facility] on 05/01/15 with dyspnea and chest pain and found to be in an acute CHF exacerbation. In the ED, CXR with CHF and BNP >4500. Also troponin noted to be elevated 0.46-->0.74. Echo 01/07/2015 EF 40-45%. Repeat echo 05/04/2015 revealed EF 20-25%, grade 1 DD, mild LVH, mild MR. Given sudden drop in EF, he underwent myoview which returned abnormal. He underwent [facility] 05/07/15 which revealed severe native CAD as well as severe bypass graft disease with CTO of SVG-->RCA, diffuse dz SVG--> OM1 with a 95% distal anastomosis stenosis and 40-70% stenosis throughout the mid body of the graft., and mod dz of SBG --> RI. However, the degree of LV dysfunction was felt to be disproportionate to areas of potential ischemia.  Per Dr. Katrinka Blazing the distal lesion in the SVG to OM could be intervened upon, but long term patency would be limited. In the absence of angina, he did not feel like this was neccessary. Unfortunately Dr. Katrinka Blazing could not complete the right heart study due to the presence of a large aneurysm in his right femoral/iliac artery aneurysm but did feel that due to the low right heart filling pressures that he was actually hypovolemic.Lasix was held and he was gently hydrated. His creat bumped after cath, but then resolved returning back to normal. His hyponatremia remained stable and was followed by nephrology. His baseline sodium is in the low 120s. He was discharged on 40 mg Lasix daily. Additionally, there was discussion about palliative care/hospice.  Today he presents to clinic for follow up. He is doing quite well. He has no chest pain or shortness of breath. No LE edema, orthopnea or PND. No blood in his stool or urine. No dizziness or syncope. He has got no complaints except for about his macular degeneration, which he would like me to tell Dr. Katrinka Blazing about. Per his wife and son, he has been obsessing about his macular degeneration. He says he cannot eat due to this. Been struggling to get him to eat meals but finally were able to get him to eat something.     Past Medical History  Diagnosis Date  . Hypertension   . GERD (gastroesophageal reflux disease)   . Aortic stenosis, severe     a.  s/p TAVR  . Embolism involving retinal artery   . BPH (benign prostatic hypertrophy)     Dr. Retta Diones  . Iliac artery aneurysm (HCC)     Dr. Edilia Bo  . AAA (abdominal aortic aneurysm) (HCC)     Dr. Ashley Royalty  . Diastolic heart failure (HCC)   . Coronary artery disease     Left main disease and severe 3-vessel CAD  . CAD (coronary artery disease) of bypass graft     Chronic occlusion of saphenous vein graft to RCA  . Hypothyroidism   . Stroke [facility])     mini stroke effective his eye left  . S/P TAVR  (transcatheter aortic valve replacement) 01/15/2013    a. 12/2012: mm Stephannie Peters XT transcatheter heart valve placed via transapical approach  . PAF (paroxysmal atrial fibrillation) (HCC)   . Blind left eye     Past Surgical History  Procedure Laterality Date  . Hemiarthroplasty hip Left     AFTER HIP FX  . Total knee arthroplasty  1992-2002    right-left  . Biopsy prostate  2005  . Transurethral resection of prostate  2012  . Joint replacement    . Harvest bone graft  1982    FOR THE  ANKLE  . Skin grafts  1968  . Colonoscopy    . Bunionectomy with hammertoe reconstruction Bilateral   . Transcatheter aortic valve replacement, transapical N/A 01/15/2013    Procedure: TRANSCATHETER AORTIC VALVE REPLACEMENT, TRANSAPICAL;  Surgeon: [name], MD;  Location: [facility] OR;  Service: Open Heart Surgery;  Laterality: N/A;  . Intraoperative transesophageal echocardiogram N/A 01/15/2013    Procedure: INTRAOPERATIVE TRANSESOPHAGEAL ECHOCARDIOGRAM;  Surgeon: [name], MD;  Location: [facility] OR;  Service: Open Heart Surgery;  Laterality: N/A;  . Left and right heart catheterization with coronary angiogram N/A 12/20/2012    Procedure: LEFT AND RIGHT HEART CATHETERIZATION WITH CORONARY ANGIOGRAM;  Surgeon: Lesleigh Noe, MD;  Location: [facility] CATH LAB;  Service: Cardiovascular;  Laterality: N/A;  . Carotid endarterectomy Right Jan. 2, 1997  . Cardiac catheterization    . Total hip arthroplasty Left 2007  . Cataract extraction w/ intraocular lens implant Right   . Coronary artery bypass graft  01/1998    Dr. Sheliah Plane; LIMA to LAD, SVG to D1, SVG to LCx, SVG to RCA, open saphenous vein harvest via right lower extremity  . Cholecystectomy N/A 01/28/2015    Procedure: LAPAROSCOPIC CHOLECYSTECTOMY WITH INTRAOPERATIVE CHOLANGIOGRAM;  Surgeon: Gaynelle Adu, MD;  Location: [facility] OR;  Service: General;  Laterality: N/A;  . Cardiac catheterization N/A 05/07/2015    Procedure: Right Heart Cath and  Coronary/Graft Angiography;  Surgeon: Lyn Records, MD;  Location: [facility] INVASIVE CV LAB;  Service: Cardiovascular;  Laterality: N/A;     Current Outpatient Prescriptions  Medication Sig Dispense Refill  . acetaminophen (TYLENOL) 325 MG tablet Take 2 tablets (650 mg total) by mouth every 6 (six) hours as needed for mild pain, moderate pain, fever or headache.    Marland Kitchen amLODipine (NORVASC) 5 MG tablet Take 0.5 tablets (2.5 mg total) by mouth daily at 3 pm.    . aspirin EC 81 MG EC tablet Take 1 tablet (81 mg total) by mouth daily.    . carvedilol (COREG) 6.25 MG tablet Take 1 tablet (6.25 mg total) by mouth 2 (two) times daily with a meal. 60 tablet 0  . folic acid (FOLVITE) 1 MG tablet Take 3 mg by mouth daily.    Marland Kitchen  furosemide (LASIX) 20 MG tablet Take 2 tablets (40 mg total) by mouth daily. 60 tablet 0  . levothyroxine (SYNTHROID, LEVOTHROID) 50 MCG tablet Take 50 mcg by mouth daily.  0  . methotrexate 2.5 MG tablet Take 25 mg by mouth every Friday.     Marland Kitchen omeprazole (PRILOSEC) 20 MG capsule Take 20 mg by mouth daily.    . polyethylene glycol (MIRALAX / GLYCOLAX) packet Take 17 g by mouth 2 (two) times daily. 14 each 0  . predniSONE (DELTASONE) 5 MG tablet Take 5 mg by mouth daily.  3  . simvastatin (ZOCOR) 20 MG tablet Take 20 mg by mouth every evening.  12   No current facility-administered medications for this visit.    Allergies:   Ciprofloxacin    Social History:  The patient  reports that he quit smoking about 57 years ago. His smoking use included Cigarettes. He has a 23.5 pack-year smoking history. He has quit using smokeless tobacco. His smokeless tobacco use included Chew. He reports that he does not drink alcohol or use illicit drugs.   Family History:  The patient's family history includes Heart attack in his brother, father, and mother; Heart disease in his brother, father, and mother; Hyperlipidemia in his brother, father, and mother; Hypertension in his brother, father, mother,  and son.    ROS:  Please see the history of present illness.   Otherwise, review of systems are positive for none.   All other systems are reviewed and negative.    PHYSICAL EXAM: VS:  BP 110/56 mmHg  Pulse 66  Ht 5\' 7"  (1.702 m)  Wt 132 lb (59.875 kg)  BMI 20.67 kg/m2 , BMI Body mass index is 20.67 kg/(m^2). GEN: Well nourished, well developed, in no acute distresschronically ill appearing HEENT: normal Neck: no JVD, carotid bruits, or masses Cardiac: RRR; no murmurs, rubs, or gallops,no edema  Respiratory:  clear to auscultation bilaterally, normal work of breathing GI: soft, nontender, nondistended, + BS MS: no deformity or atrophy Skin: warm and dry, no rash Neuro:  Strength and sensation are intact Psych: euthymic mood, full affect   EKG:  EKG is ordered today.   Recent Labs: 01/31/2015: ALT 21 02/01/2015: Magnesium 1.9 05/01/2015: B Natriuretic Peptide >4500.0* 05/07/2015: TSH 3.050 05/11/2015: BUN 27*; Creatinine, Ser 1.06; Hemoglobin 10.6*; Platelets 132*; Potassium 4.0; Sodium 123*    Lipid Panel    Component Value Date/Time   CHOL 84 02/21/2014 0210   TRIG 93 02/21/2014 0210   HDL 27* 02/21/2014 0210   CHOLHDL 3.1 02/21/2014 0210   VLDL 19 02/21/2014 0210   LDLCALC 38 02/21/2014 0210      Wt Readings from Last 3 Encounters:  05/18/15 132 lb (59.875 kg)  05/11/15 129 lb 9.6 oz (58.786 kg)  02/03/15 132 lb 11.5 oz (60.2 kg)      Other studies Reviewed: Additional studies/ records that were reviewed today include: [facility] Review of the above records demonstrates:   05/07/15 Conclusion    1. Prox RCA lesion, 85% stenosed. 2. Dist RCA lesion, 100% stenosed. 3. SVG was injected is normal in caliber. 4. Origin lesion, 100% stenosed. 5. Ramus lesion, 80% stenosed. 6. LM lesion, 80% stenosed. 7. SVG was injected . 8. There is severe disease in the graft. 9. Ost Cx to Prox Cx lesion, 100% stenosed. 10. Ost 1st Mrg to 1st Mrg lesion, 100% stenosed. 11. SVG  was injected is very large. 12. There is severe disease in the graft. 13. Dist Graft to  Insertion lesion, 95% stenosed. 14. 1st Mrg lesion, 99% stenosed. 15. Prox Graft to Mid Graft lesion, 70% stenosed. 16. Ost 2nd Diag lesion, 75% stenosed. 17. Ost 1st Diag lesion, 85% stenosed. 18. Mid LAD to Dist LAD lesion, 90% stenosed. 19. LIMA was injected is normal in caliber, and is anatomically normal.   Extremely difficult procedure due to accomplished from the right femoral approach due to femoral/iliac aneurysm, severe of vessel tortuosity, and calcification. These features severely limited catheter torque and movement.  Inability to enter the pulmonary artery due to inability to control catheter and absence of torque control.  Severe native vessel coronary disease with total occlusion of the distal right, 90% stenosis of the proximal right, total occlusion of circumflex, 80% distal left main (previously identified), 80% proximal LAD, and high-grade obstruction in each with 2 diagonal branches.  Severe bypass graft disease with total occlusion of the SVG to the right coronary (chronic). Diffuse saphenous vein graft disease involving the SVG to the obtuse marginal with a 95% distal anastomosis stenosis and 40-70% stenosis throughout the mid body of the graft. The SVG to the ramus intermedius contains moderate diffuse disease with obstruction up to 50%.  Widely patent LIMA to LAD  Severe ischemic cardiomyopathy with EF 20-25%. There appears to be global hypokinesis. The degree of LV dysfunction is disproportionate to areas of potential ischemia which include the right coronary distribution and the distal circumflex.  Chronic kidney disease, III.  No evidence of pulmonary hypertension and low right heart filling pressures.  RECOMMENDATIONS:   The distal lesion in the SVG to obtuse marginal could be intervened upon. Even with distal protection and stenting, long-term patency of this graft  is limited due to the severe diffuse disease throughout the graft. In the absence of angina or other evidence of ischemia, a staged procedure on this saphenous vein graft is of questionable utility. However, intervention on this graft could be performed if it is felt to be clinically beneficial.  Monitored kidney function  Gentle hydration          ASSESSMENT AND PLAN:  Hunter Choi is a 82 y.o. 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  Acute on chronic combined systolic and diastolic HF (EF 40-34%-->74-25%) - Echo 01/07/2015 EF 40-45%. Repeat echo 05/04/2015 EF 20-25%, grade 1 DD, mild LVH, mild MR.  - Given sudden drop in EF, he underwent myoview which returned abnormal. He underwent L/RHC 2/9/17which revealed severe native CAD as well as severe bypass graft disease with CTO of SVG-->RCA, diffuse dz SVG--> OM1 with a 95% distal anastomosis stenosis and 40-70% stenosis throughout the mid body of the graft., and mod dz of SBG --> RI. However, the degree of LV dysfunction is disproportionate to areas of potential ischemia  - Contunue coreg 6.25mg  BID and lasix 40mg  daily. His lisinopril was held at discharge due to AKI. I will get a BMET today. BP soft today, so I will hold off on adding back Lisinopril for now.   - We went over her salt and fluid restrictions.  CAD s/p CABG x4V (1999); s/p PCI dLAD (  2004): R/LHC 2/9/17which revealed severe native CAD as well as severe bypass graft disease with CTO of SVG-->RCA, diffuse dz SVG--> OM1 with a 95% distal anastomosis stenosis and  40-70% stenosis throughout the mid body of the graft., and mod dz of SBG --> RI. Per Dr. Katrinka Blazing the distal lesion in the SVG to OM could be intervened upon, but long term patency would be limited. In the absence of angina, he did not feel like this was neccessary.   AS s/p TAVR (12/2012)  PAF (felt to be an isolated event and not on long term AC)  Hyponatremia: baseline Na mid 120s. Dr. Myrtis Ser reviewed previous notes from discharge in 01/2015 where chronic hyponatremia was discussed. There was felt to be a component of SIDAH. Check a BMET today.  CVA w/ residual L eye blindness  HTN: BP well controlled on current regimen.  HLD: Continue statin.   Current medicines are reviewed at length with the patient today.  The patient does not have concerns regarding medicines.  The following changes have been made:  no change  Labs/ tests ordered today include:   Orders Placed This Encounter  Procedures  . Basic metabolic panel     Disposition:   FU with Dr.Smith next month as previously scheduled  Charlestine Massed  05/18/2015 11:12 AM    [facility] [address] [place], [place], [place]  [address] Phone: [phone]; Fax: [phone]

## 2015-05-18 ENCOUNTER — Telehealth: Payer: Self-pay | Admitting: Interventional Cardiology

## 2015-05-18 ENCOUNTER — Encounter: Payer: Self-pay | Admitting: Physician Assistant

## 2015-05-18 ENCOUNTER — Ambulatory Visit (INDEPENDENT_AMBULATORY_CARE_PROVIDER_SITE_OTHER): Payer: Medicare Other | Admitting: Physician Assistant

## 2015-05-18 VITALS — BP 110/56 | HR 66 | Ht 67.0 in | Wt 132.0 lb

## 2015-05-18 DIAGNOSIS — I5042 Chronic combined systolic (congestive) and diastolic (congestive) heart failure: Secondary | ICD-10-CM | POA: Diagnosis not present

## 2015-05-18 LAB — BASIC METABOLIC PANEL
BUN: 35 mg/dL — AB (ref 7–25)
CHLORIDE: 96 mmol/L — AB (ref 98–110)
CO2: 26 mmol/L (ref 20–31)
Calcium: 8.6 mg/dL (ref 8.6–10.3)
Creat: 1.28 mg/dL — ABNORMAL HIGH (ref 0.70–1.11)
Glucose, Bld: 127 mg/dL — ABNORMAL HIGH (ref 65–99)
POTASSIUM: 4.7 mmol/L (ref 3.5–5.3)
Sodium: 131 mmol/L — ABNORMAL LOW (ref 135–146)

## 2015-05-18 NOTE — Patient Instructions (Signed)
Medication Instructions:  Your physician recommends that you continue on your current medications as directed. Please refer to the Current Medication list given to you today.   Labwork: Bmet today  Testing/Procedures: None ordered  Follow-Up: Follow up as planned with Dr.Smith  You have been referred to [facility] for at Home Physical Therapy   Any Other Special Instructions Will Be Listed Below (If Applicable).     If you need a refill on your cardiac medications before your next appointment, please call your pharmacy.

## 2015-05-18 NOTE — Telephone Encounter (Signed)
Efraim Kaufmann is calling because  Cline Crock sent in a referral for [facility] and they are not able to accept her insurance for Home Health at this time . Please call if you have any questions at [phone]

## 2015-05-18 NOTE — Telephone Encounter (Signed)
Pt wife aware of [facility] response to PT ref. Per [facility], they are not able to accept his insurance for Home Health at this time

## 2015-05-20 ENCOUNTER — Other Ambulatory Visit: Payer: Self-pay

## 2015-05-20 DIAGNOSIS — I5032 Chronic diastolic (congestive) heart failure: Secondary | ICD-10-CM

## 2015-05-27 ENCOUNTER — Other Ambulatory Visit (INDEPENDENT_AMBULATORY_CARE_PROVIDER_SITE_OTHER): Payer: Medicare Other | Admitting: *Deleted

## 2015-05-27 DIAGNOSIS — I5032 Chronic diastolic (congestive) heart failure: Secondary | ICD-10-CM

## 2015-05-27 LAB — BASIC METABOLIC PANEL
BUN: 40 mg/dL — ABNORMAL HIGH (ref 7–25)
CHLORIDE: 97 mmol/L — AB (ref 98–110)
CO2: 28 mmol/L (ref 20–31)
Calcium: 8.3 mg/dL — ABNORMAL LOW (ref 8.6–10.3)
Creat: 1.2 mg/dL — ABNORMAL HIGH (ref 0.70–1.11)
Glucose, Bld: 120 mg/dL — ABNORMAL HIGH (ref 65–99)
POTASSIUM: 4.8 mmol/L (ref 3.5–5.3)
Sodium: 137 mmol/L (ref 135–146)

## 2015-06-19 ENCOUNTER — Encounter: Payer: Self-pay | Admitting: Interventional Cardiology

## 2015-06-19 ENCOUNTER — Ambulatory Visit (INDEPENDENT_AMBULATORY_CARE_PROVIDER_SITE_OTHER): Payer: Medicare Other | Admitting: Interventional Cardiology

## 2015-06-19 VITALS — BP 96/50 | HR 105 | Ht 67.0 in | Wt 121.0 lb

## 2015-06-19 DIAGNOSIS — Z954 Presence of other heart-valve replacement: Secondary | ICD-10-CM | POA: Diagnosis not present

## 2015-06-19 DIAGNOSIS — I10 Essential (primary) hypertension: Secondary | ICD-10-CM

## 2015-06-19 DIAGNOSIS — Z952 Presence of prosthetic heart valve: Secondary | ICD-10-CM

## 2015-06-19 DIAGNOSIS — I25810 Atherosclerosis of coronary artery bypass graft(s) without angina pectoris: Secondary | ICD-10-CM

## 2015-06-19 DIAGNOSIS — Z951 Presence of aortocoronary bypass graft: Secondary | ICD-10-CM | POA: Diagnosis not present

## 2015-06-19 DIAGNOSIS — I5042 Chronic combined systolic (congestive) and diastolic (congestive) heart failure: Secondary | ICD-10-CM

## 2015-06-19 LAB — BASIC METABOLIC PANEL
BUN: 32 mg/dL — AB (ref 7–25)
CO2: 27 mmol/L (ref 20–31)
Calcium: 8.4 mg/dL — ABNORMAL LOW (ref 8.6–10.3)
Chloride: 95 mmol/L — ABNORMAL LOW (ref 98–110)
Creat: 1.17 mg/dL — ABNORMAL HIGH (ref 0.70–1.11)
GLUCOSE: 149 mg/dL — AB (ref 65–99)
Potassium: 4.3 mmol/L (ref 3.5–5.3)
Sodium: 131 mmol/L — ABNORMAL LOW (ref 135–146)

## 2015-06-19 LAB — BRAIN NATRIURETIC PEPTIDE: BRAIN NATRIURETIC PEPTIDE: 340.6 pg/mL — AB (ref <100)

## 2015-06-19 MED ORDER — NITROGLYCERIN 0.4 MG SL SUBL: 0.4000 mg | SUBLINGUAL_TABLET | SUBLINGUAL | Status: DC | PRN

## 2015-06-19 NOTE — Patient Instructions (Addendum)
Medication Instructions:   Your physician recommends that you continue on your current medications as directed. Please refer to the Current Medication list given to you today.   If you need a refill on your cardiac medications before your next appointment, please call your pharmacy.  Labwork:  BMET AND BNP   Testing/Procedures: NONE ORDER TODAY    Follow-Up:  4 MONTHS WITH DR Katrinka Blazing    Any Other Special Instructions Will Be Listed Below (If Applicable).   Nitroglycerin sublingual tablets What is this medicine? NITROGLYCERIN (nye troe GLI ser in) is a type of vasodilator. It relaxes blood vessels, increasing the blood and oxygen supply to your heart. This medicine is used to relieve chest pain caused by angina. It is also used to prevent chest pain before activities like climbing stairs, going outdoors in cold weather, or sexual activity. This medicine may be used for other purposes; ask your health care provider or pharmacist if you have questions. What should I tell my health care provider before I take this medicine? They need to know if you have any of these conditions: -anemia -head injury, recent stroke, or bleeding in the brain -liver disease -previous heart attack -an unusual or allergic reaction to nitroglycerin, other medicines, foods, dyes, or preservatives -pregnant or trying to get pregnant -breast-feeding How should I use this medicine? Take this medicine by mouth as needed. At the first sign of an angina attack (chest pain or tightness) place one tablet under your tongue. You can also take this medicine 5 to 10 minutes before an event likely to produce chest pain. Follow the directions on the prescription label. Let the tablet dissolve under the tongue. Do not swallow whole. Replace the dose if you accidentally swallow it. It will help if your mouth is not dry. Saliva around the tablet will help it to dissolve more quickly. Do not eat or drink, smoke or chew tobacco  while a tablet is dissolving. If you are not better within 5 minutes after taking ONE dose of nitroglycerin, call 9-1-1 immediately to seek emergency medical care. Do not take more than 3 nitroglycerin tablets over 15 minutes.  If you take this medicine often to relieve symptoms of angina, your doctor or health care professional may provide you with different instructions to manage your symptoms. If symptoms do not go away after following these instructions, it is important to call 9-1-1 immediately. Do not take more than 3 nitroglycerin tablets over 15 minutes.  Talk to your pediatrician regarding the use of this medicine in children. Special care may be needed. Overdosage: If you think you have taken too much of this medicine contact a poison control center or emergency room at once. NOTE: This medicine is only for you. Do not share this medicine with others.  What if I miss a dose? This does not apply. This medicine is only used as needed. What may interact with this medicine? Do not take this medicine with any of the following medications: -certain migraine medicines like ergotamine and dihydroergotamine (DHE) -medicines used to treat erectile dysfunction like sildenafil, tadalafil, and vardenafil -riociguat This medicine may also interact with the following medications: -alteplase -aspirin -heparin -medicines for high blood pressure -medicines for mental depression -other medicines used to treat angina -phenothiazines like chlorpromazine, mesoridazine, prochlorperazine, thioridazine This list may not describe all possible interactions. Give your health care provider a list of all the medicines, herbs, non-prescription drugs, or dietary supplements you use. Also tell them if you smoke, drink alcohol,  or use illegal drugs. Some items may interact with your medicine. What should I watch for while using this medicine? Tell your doctor or health care professional if you feel your medicine is  no longer working. Keep this medicine with you at all times. Sit or lie down when you take your medicine to prevent falling if you feel dizzy or faint after using it. Try to remain calm. This will help you to feel better faster. If you feel dizzy, take several deep breaths and lie down with your feet propped up, or bend forward with your head resting between your knees. You may get drowsy or dizzy. Do not drive, use machinery, or do anything that needs mental alertness until you know how this drug affects you. Do not stand or sit up quickly, especially if you are an older patient. This reduces the risk of dizzy or fainting spells. Alcohol can make you more drowsy and dizzy. Avoid alcoholic drinks. Do not treat yourself for coughs, colds, or pain while you are taking this medicine without asking your doctor or health care professional for advice. Some ingredients may increase your blood pressure. What side effects may I notice from receiving this medicine? Side effects that you should report to your doctor or health care professional as soon as possible: -blurred vision -dry mouth -skin rash -sweating -the feeling of extreme pressure in the head -unusually weak or tired Side effects that usually do not require medical attention (report to your doctor or health care professional if they continue or are bothersome): -flushing of the face or neck -headache -irregular heartbeat, palpitations -nausea, vomiting This list may not describe all possible side effects. Call your doctor for medical advice about side effects. You may report side effects to FDA at 1-800-FDA-1088. Where should I keep my medicine? Keep out of the reach of children. Store at room temperature between 20 and 25 degrees C (68 and 77 degrees F). Store in Retail buyer. Protect from light and moisture. Keep tightly closed. Throw away any unused medicine after the expiration date. NOTE: This sheet is a summary. It may not cover  all possible information. If you have questions about this medicine, talk to your doctor, pharmacist, or health care provider.    2016, Elsevier/Gold Standard. (2013-01-10 17:57:36)

## 2015-06-19 NOTE — Progress Notes (Signed)
Cardiology Office Note   Date:  06/19/2015   ID:  HAROUT SCHEURICH, DOB 10-02-1927, MRN [id number]  PCP:  Tommy Rainwater, MD  Cardiologist:  Lesleigh Noe, MD   Chief Complaint  Patient presents with  . Congestive Heart Failure  . Coronary Artery Disease      History of Present Illness: Hunter Choi is a 82 y.o. male who presents for transaortic valve replacement for aortic stenosis, CAD with bypass graft occlusion, hearing loss, and chronic kidney disease.  Doing well. Accompanied by his wife. [facility] of care and hospice. Looks much better than he did at the time of discharge from the hospital. Denies orthopnea, PND, syncope, and palpitations.    Past Medical History  Diagnosis Date  . Hypertension   . GERD (gastroesophageal reflux disease)   . Aortic stenosis, severe     a. s/p TAVR  . Embolism involving retinal artery   . BPH (benign prostatic hypertrophy)     Dr. Retta Diones  . Iliac artery aneurysm (HCC)     Dr. Edilia Bo  . AAA (abdominal aortic aneurysm) (HCC)     Dr. Ashley Royalty  . Diastolic heart failure (HCC)   . Coronary artery disease     Left main disease and severe 3-vessel CAD  . CAD (coronary artery disease) of bypass graft     Chronic occlusion of saphenous vein graft to RCA  . Hypothyroidism   . Stroke [facility])     mini stroke effective his eye left  . 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 12/2012: mm Stephannie Peters XT transcatheter heart valve placed via transapical approach  . PAF (paroxysmal atrial fibrillation) (HCC)   . Blind left eye     Past Surgical History  Procedure Laterality Date  . Hemiarthroplasty hip Left     AFTER HIP FX  . Total knee arthroplasty  1992-2002    right-left  . Biopsy prostate  2005  . Transurethral resection of prostate  2012  . Joint replacement    . Harvest bone graft  1982    FOR THE  ANKLE  . Skin grafts  1968  . Colonoscopy    . Bunionectomy with  hammertoe reconstruction Bilateral   . Transcatheter aortic valve replacement, transapical N/A 01/15/2013    Procedure: TRANSCATHETER AORTIC VALVE REPLACEMENT, TRANSAPICAL;  Surgeon: [name], MD;  Location: [facility] OR;  Service: Open Heart Surgery;  Laterality: N/A;  . Intraoperative transesophageal echocardiogram N/A 01/15/2013    Procedure: INTRAOPERATIVE TRANSESOPHAGEAL ECHOCARDIOGRAM;  Surgeon: [name], MD;  Location: [facility] OR;  Service: Open Heart Surgery;  Laterality: N/A;  . Left and right heart catheterization with coronary angiogram N/A 12/20/2012    Procedure: LEFT AND RIGHT HEART CATHETERIZATION WITH CORONARY ANGIOGRAM;  Surgeon: Lesleigh Noe, MD;  Location: [facility] CATH LAB;  Service: Cardiovascular;  Laterality: N/A;  . Carotid endarterectomy Right Jan. 2, 1997  . Cardiac catheterization    . Total hip arthroplasty Left 2007  . Cataract extraction w/ intraocular lens implant Right   . Coronary artery bypass graft  01/1998    Dr. Sheliah Plane; LIMA to LAD, SVG to D1, SVG to LCx, SVG to RCA, open saphenous vein harvest via right lower extremity  . Cholecystectomy N/A 01/28/2015    Procedure: LAPAROSCOPIC CHOLECYSTECTOMY WITH INTRAOPERATIVE CHOLANGIOGRAM;  Surgeon: Gaynelle Adu, MD;  Location: [facility] OR;  Service: General;  Laterality: N/A;  . Cardiac catheterization N/A 05/07/2015  Procedure: Right Heart Cath and Coronary/Graft Angiography;  Surgeon: Lyn Records, MD;  Location: [facility] INVASIVE CV LAB;  Service: Cardiovascular;  Laterality: N/A;     Current Outpatient Prescriptions  Medication Sig Dispense Refill  . acetaminophen (TYLENOL) 325 MG tablet Take 2 tablets (650 mg total) by mouth every 6 (six) hours as needed for mild pain, moderate pain, fever or headache.    Marland Kitchen amLODipine (NORVASC) 5 MG tablet Take 0.5 tablets (2.5 mg total) by mouth daily at 3 pm.    . aspirin EC 81 MG EC tablet Take 1 tablet (81 mg total) by mouth daily.    . carvedilol (COREG) 6.25 MG tablet Take 1  tablet (6.25 mg total) by mouth 2 (two) times daily with a meal. 60 tablet 0  . folic acid (FOLVITE) 1 MG tablet Take 3 mg by mouth daily.    . furosemide (LASIX) 20 MG tablet Take 2 tablets (40 mg total) by mouth daily. 60 tablet 0  . levothyroxine (SYNTHROID, LEVOTHROID) 50 MCG tablet Take 50 mcg by mouth daily.  0  . methotrexate 2.5 MG tablet Take 25 mg by mouth every Friday.     Marland Kitchen omeprazole (PRILOSEC) 20 MG capsule Take 20 mg by mouth daily.    . predniSONE (DELTASONE) 5 MG tablet Take 5 mg by mouth daily.  3  . simvastatin (ZOCOR) 20 MG tablet Take 20 mg by mouth every evening.  12  . nitroGLYCERIN (NITROSTAT) 0.4 MG SL tablet Place 1 tablet (0.4 mg total) under the tongue every 5 (five) minutes as needed for chest pain. 25 tablet 3   No current facility-administered medications for this visit.    Allergies:   Ciprofloxacin    Social History:  The patient  reports that he quit smoking about 57 years ago. His smoking use included Cigarettes. He has a 23.5 pack-year smoking history. He has quit using smokeless tobacco. His smokeless tobacco use included Chew. He reports that he does not drink alcohol or use illicit drugs.   Family History:  The patient's family history includes Heart attack in his brother, father, and mother; Heart disease in his brother, father, and mother; Hyperlipidemia in his brother, father, and mother; Hypertension in his brother, father, mother, and son.    ROS:  Please see the history of present illness.   Otherwise, review of systems are positive for Cough, and hearing loss..   All other systems are reviewed and negative.    PHYSICAL EXAM: VS:  BP 96/50 mmHg  Pulse 105  Ht 5\' 7"  (1.702 m)  Wt 121 lb (54.885 kg)  BMI 18.95 kg/m2 , BMI Body mass index is 18.95 kg/(m^2). GEN: Well nourished, well developed, in no acute distress HEENT: normal Neck: no JVD, carotid bruits, or masses Cardiac: RRR.  There is no murmur, rub, or gallop. There is no  edema. Respiratory:  clear to auscultation bilaterally, normal work of breathing. GI: soft, nontender, nondistended, + BS MS: no deformity or atrophy Skin: warm and dry, no rash Neuro:  Strength and sensation are intact Psych: euthymic mood, full affect   EKG:  EKG is not ordered today.    Recent Labs: 01/31/2015: ALT 21 02/01/2015: Magnesium 1.9 05/01/2015: B Natriuretic Peptide >4500.0* 05/07/2015: TSH 3.050 05/11/2015: Hemoglobin 10.6*; Platelets 132* 05/27/2015: BUN 40*; Creat 1.20*; Potassium 4.8; Sodium 137    Lipid Panel    Component Value Date/Time   CHOL 84 02/21/2014 0210   TRIG 93 02/21/2014 0210   HDL 27* 02/21/2014  0210   CHOLHDL 3.1 02/21/2014 0210   VLDL 19 02/21/2014 0210   LDLCALC 38 02/21/2014 0210      Wt Readings from Last 3 Encounters:  06/19/15 121 lb (54.885 kg)  05/18/15 132 lb (59.875 kg)  05/11/15 129 lb 9.6 oz (58.786 kg)      Other studies Reviewed: Additional studies/ records that were reviewed today include: Reviewed laboratory data and recent hospital stay. The findings include last blood work was done 3 weeks ago and was relatively stable although there was azotemia..    ASSESSMENT AND PLAN:  1. S/P TAVR (transcatheter aortic valve replacement) Normal valve function  2. S/P CABG x 4 No anginal complaints. Does have bypass graft occlusion Dr. by cath in February  3. Coronary artery disease involving coronary bypass graft of native heart without angina pectoris As above  4. Essential hypertension Controlled  5. Chronic combined systolic and diastolic heart failure (HCC) No evidence of volume overload. Rule out dehydration    Current medicines are reviewed at length with the patient today.  The patient has the following concerns regarding medicines: None.  The following changes/actions have been instituted:    Nitroglycerin if recurrent chest pain  Basic metabolic panel and BNP today  Labs/ tests ordered today include:    Orders Placed This Encounter  Procedures  . Basic Metabolic Panel (BMET)  . B Nat Peptide     Disposition:   FU with HS in 4 months  Signed, Lesleigh Noe, MD  06/19/2015 12:35 PM    [facility] [address] [place], [place], [place]  [address] Phone: [phone]; Fax: [phone]

## 2015-09-03 ENCOUNTER — Encounter: Payer: Self-pay | Admitting: Vascular Surgery

## 2015-09-09 ENCOUNTER — Other Ambulatory Visit: Payer: Self-pay | Admitting: Family

## 2015-09-09 ENCOUNTER — Ambulatory Visit (HOSPITAL_COMMUNITY)
Admission: RE | Admit: 2015-09-09 | Discharge: 2015-09-09 | Disposition: A | Discharge status: Home / Self Care | Payer: Medicare Other | Source: Ambulatory Visit | Attending: Vascular Surgery | Admitting: Vascular Surgery

## 2015-09-09 ENCOUNTER — Encounter: Payer: Self-pay | Admitting: Vascular Surgery

## 2015-09-09 ENCOUNTER — Ambulatory Visit (INDEPENDENT_AMBULATORY_CARE_PROVIDER_SITE_OTHER)
Admission: RE | Admit: 2015-09-09 | Discharge: 2015-09-09 | Disposition: A | Discharge status: Home / Self Care | Payer: Medicare Other | Source: Ambulatory Visit

## 2015-09-09 ENCOUNTER — Ambulatory Visit (INDEPENDENT_AMBULATORY_CARE_PROVIDER_SITE_OTHER): Payer: Medicare Other | Admitting: Vascular Surgery

## 2015-09-09 VITALS — BP 128/64 | HR 61 | Ht 67.0 in | Wt 136.0 lb

## 2015-09-09 DIAGNOSIS — I6522 Occlusion and stenosis of left carotid artery: Secondary | ICD-10-CM | POA: Diagnosis not present

## 2015-09-09 DIAGNOSIS — I10 Essential (primary) hypertension: Secondary | ICD-10-CM | POA: Diagnosis not present

## 2015-09-09 DIAGNOSIS — I714 Abdominal aortic aneurysm, without rupture, unspecified: Secondary | ICD-10-CM

## 2015-09-09 DIAGNOSIS — Z9889 Other specified postprocedural states: Secondary | ICD-10-CM | POA: Diagnosis not present

## 2015-09-09 DIAGNOSIS — I723 Aneurysm of iliac artery: Secondary | ICD-10-CM

## 2015-09-09 DIAGNOSIS — I2510 Atherosclerotic heart disease of native coronary artery without angina pectoris: Secondary | ICD-10-CM | POA: Insufficient documentation

## 2015-09-09 DIAGNOSIS — K219 Gastro-esophageal reflux disease without esophagitis: Secondary | ICD-10-CM | POA: Insufficient documentation

## 2015-09-09 NOTE — Progress Notes (Signed)
[facility] [place]  Patient name: Hunter Choi MRN: [id number] DOB: 1927-11-14 Sex: male  REASON FOR VISIT: follow up of carotid disease and abdominal aortic aneurysm.  HPI: Hunter Choi is a 82 y.o. male who I last saw on 03/06/2013. He has a known abdominal aortic aneurysm and bilateral common iliac artery aneurysms. At that time he had a 4.7 cm distal infrarenal abdominal aortic aneurysm and a 3.8 cm left common iliac artery aneurysm with bilateral hypogastric artery aneurysms. Given his age and medical comorbidities he was not felt to be a good candidate for open repair of his aneurysms. In addition he did not appear to be a candidate for even R. The patient and the patient's son at that time were in agreement with no aggressive treatment of the aneurysm at that time.  Since I saw him last, he denies any significant abdominal pain or back pain. He does have dyspnea on exertion and a history of significant congestive heart failure.  He denies any history of stroke, TIAs, expressive or receptive aphasia, or amaurosis fugax.  He is not a smoker. He is on aspirin.  Past Medical History  Diagnosis Date  . Hypertension   . GERD (gastroesophageal reflux disease)   . Aortic stenosis, severe     a. s/p TAVR  . Embolism involving retinal artery   . BPH (benign prostatic hypertrophy)     Dr. Retta Diones  . Iliac artery aneurysm (HCC)     Dr. Edilia Bo  . AAA (abdominal aortic aneurysm) (HCC)     Dr. Ashley Royalty  . Diastolic heart failure (HCC)   . Coronary artery disease     Left main disease and severe 3-vessel CAD  . CAD (coronary artery disease) of bypass graft     Chronic occlusion of saphenous vein graft to RCA  . Hypothyroidism   . Stroke [facility])     mini stroke effective his eye left  . 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 12/2012: mm Stephannie Peters XT transcatheter heart valve placed via transapical approach  . PAF  (paroxysmal atrial fibrillation) (HCC)   . Blind left eye     Family History  Problem Relation Age of Onset  . Heart attack Mother   . Heart disease Mother     Before age 19  . Hyperlipidemia Mother   . Hypertension Mother   . Hypertension Son   . Heart disease Father     AAA-   . Hyperlipidemia Father   . Heart attack Father   . Hypertension Father   . Heart disease Brother     Before age 64  . Hyperlipidemia Brother   . Heart attack Brother   . Hypertension Brother     SOCIAL HISTORY: Social History  Substance Use Topics  . Smoking status: Former Smoker -- 0.50 packs/day for 47 years    Types: Cigarettes    Quit date: 03/28/1958  . Smokeless tobacco: Former Neurosurgeon    Types: Chew     Comment: ONLY USED 2 YEARS  . Alcohol Use: No    Allergies  Allergen Reactions  . Ciprofloxacin Other (See Comments)    REACTION: mild delirium    Current Outpatient Prescriptions  Medication Sig Dispense Refill  . acetaminophen (TYLENOL) 325 MG tablet Take 2 tablets (650 mg total) by mouth every 6 (six) hours as needed for mild pain, moderate pain, fever or headache.    Marland Kitchen amLODipine (NORVASC) 5 MG  tablet Take 0.5 tablets (2.5 mg total) by mouth daily at 3 pm.    . aspirin EC 81 MG EC tablet Take 1 tablet (81 mg total) by mouth daily.    . carvedilol (COREG) 6.25 MG tablet Take 1 tablet (6.25 mg total) by mouth 2 (two) times daily with a meal. 60 tablet 0  . folic acid (FOLVITE) 1 MG tablet Take 3 mg by mouth daily.    . furosemide (LASIX) 20 MG tablet Take 2 tablets (40 mg total) by mouth daily. 60 tablet 0  . levothyroxine (SYNTHROID, LEVOTHROID) 50 MCG tablet Take 50 mcg by mouth daily.  0  . methotrexate 2.5 MG tablet Take 25 mg by mouth every Friday.     . nitroGLYCERIN (NITROSTAT) 0.4 MG SL tablet Place 1 tablet (0.4 mg total) under the tongue every 5 (five) minutes as needed for chest pain. 25 tablet 3  . omeprazole (PRILOSEC) 20 MG capsule Take 20 mg by mouth daily.    .  predniSONE (DELTASONE) 5 MG tablet Take 5 mg by mouth daily.  3  . simvastatin (ZOCOR) 20 MG tablet Take 20 mg by mouth every evening.  12   No current facility-administered medications for this visit.    REVIEW OF SYSTEMS:  [X]  denotes positive finding, [ ]  denotes negative finding Cardiac  Comments:  Chest pain or chest pressure:    Shortness of breath upon exertion: X   Short of breath when lying flat:    Irregular heart rhythm:        Vascular    Pain in calf, thigh, or hip brought on by ambulation:    Pain in feet at night that wakes you up from your sleep:     Blood clot in your veins:    Leg swelling:         Pulmonary    Oxygen at home:    Productive cough:     Wheezing:         Neurologic    Sudden weakness in arms or legs:     Sudden numbness in arms or legs:     Sudden onset of difficulty speaking or slurred speech:    Temporary loss of vision in one eye:     Problems with dizziness:         Gastrointestinal    Blood in stool:     Vomited blood:         Genitourinary    Burning when urinating:     Blood in urine:        Psychiatric    Major depression:         Hematologic    Bleeding problems:    Problems with blood clotting too easily:        Skin    Rashes or ulcers:        Constitutional    Fever or chills:      PHYSICAL EXAM: Filed Vitals:   09/09/15 1147 09/09/15 1149  BP: 123/72 128/64  Pulse: 61   Height: 5\' 7"  (1.702 m)   Weight: 136 lb (61.689 kg)   SpO2: 100%     GENERAL: The patient is a well-nourished male, in no acute distress. The vital signs are documented above. CARDIAC: There is a regular rate and rhythm.  VASCULAR: he has a left carotid bruit. He has normal femoral pulses. Both feet are warm and well-perfused. PULMONARY: There is good air exchange bilaterally without wheezing or rales. ABDOMEN: Soft and  non-tender with normal pitched bowel sounds. His aneurysm is palpable and nontender. MUSCULOSKELETAL: There are no  major deformities or cyanosis. NEUROLOGIC: No focal weakness or paresthesias are detected. SKIN: There are no ulcers or rashes noted. PSYCHIATRIC: The patient has a normal affect.  DATA:   CAROTID DUPLEX: I have independently interpreted his carotid duplex scan today. The right carotid endarterectomy site is widely patent without evidence of recurrent stenosis. He has a known left internal carotid artery occlusion.  DUPLEX ABDOMINAL AORTA: I have independent interpreted his duplex of his aorta today. The maximum diameter of his distal aorta is 4.78 cm. The right common iliac artery measures 5.2 cm. The left common iliac artery measures 4.5 cm.  MEDICAL ISSUES:  ABDOMINAL AORTIC ANEURYSM AND BILATERAL COMMON ILIAC ARTERY ANEURYSMS: Most concerning is his large right common iliac artery aneurysm. This is not changed in size over the last year. We have had multiple discussions with his son and the patient concerning his risks for open repair. Likewise, based on his anatomy he does not appear to be a good candidate for an endovascular repair either. I have again discussed the situation with the patient and the son and they feel strongly that they do not wish to seek an aggressive approach for his aneurysmal disease at this time. I think this is perfectly reasonable. His blood pressure seems well controlled. He is not a smoker. I did discuss the option of discontinuing follow up studies however they would like to continue with yearly follow up studies.  STATUS POST RIGHT CAROTID ENDARTERECTOMY: He has no evidence of recurrent carotid stenosis on the right. He has a known left internal carotid artery occlusion. He is asymptomatic from this standpoint. He is on aspirin.  Waverly Ferrari [facility] [place] [phone]

## 2015-10-02 ENCOUNTER — Encounter (HOSPITAL_COMMUNITY): Payer: Self-pay

## 2015-10-02 ENCOUNTER — Emergency Department (HOSPITAL_COMMUNITY): Payer: Medicare Other

## 2015-10-02 ENCOUNTER — Inpatient Hospital Stay (HOSPITAL_COMMUNITY)
Admission: EM | Admit: 2015-10-02 | Discharge: 2015-10-06 | DRG: 872 | Disposition: A | Discharge status: Home Health | Payer: Medicare Other | Attending: Internal Medicine | Admitting: Internal Medicine

## 2015-10-02 DIAGNOSIS — Z6821 Body mass index (BMI) 21.0-21.9, adult: Secondary | ICD-10-CM

## 2015-10-02 DIAGNOSIS — E876 Hypokalemia: Secondary | ICD-10-CM | POA: Diagnosis not present

## 2015-10-02 DIAGNOSIS — R6520 Severe sepsis without septic shock: Secondary | ICD-10-CM

## 2015-10-02 DIAGNOSIS — E872 Acidosis: Secondary | ICD-10-CM | POA: Diagnosis present

## 2015-10-02 DIAGNOSIS — Z7982 Long term (current) use of aspirin: Secondary | ICD-10-CM | POA: Diagnosis not present

## 2015-10-02 DIAGNOSIS — E871 Hypo-osmolality and hyponatremia: Secondary | ICD-10-CM | POA: Diagnosis present

## 2015-10-02 DIAGNOSIS — Z79899 Other long term (current) drug therapy: Secondary | ICD-10-CM | POA: Diagnosis not present

## 2015-10-02 DIAGNOSIS — E039 Hypothyroidism, unspecified: Secondary | ICD-10-CM | POA: Diagnosis present

## 2015-10-02 DIAGNOSIS — Z952 Presence of prosthetic heart valve: Secondary | ICD-10-CM | POA: Diagnosis not present

## 2015-10-02 DIAGNOSIS — Z96653 Presence of artificial knee joint, bilateral: Secondary | ICD-10-CM | POA: Diagnosis present

## 2015-10-02 DIAGNOSIS — I255 Ischemic cardiomyopathy: Secondary | ICD-10-CM | POA: Diagnosis present

## 2015-10-02 DIAGNOSIS — K219 Gastro-esophageal reflux disease without esophagitis: Secondary | ICD-10-CM | POA: Diagnosis present

## 2015-10-02 DIAGNOSIS — D649 Anemia, unspecified: Secondary | ICD-10-CM | POA: Diagnosis present

## 2015-10-02 DIAGNOSIS — N390 Urinary tract infection, site not specified: Secondary | ICD-10-CM | POA: Diagnosis present

## 2015-10-02 DIAGNOSIS — D520 Dietary folate deficiency anemia: Secondary | ICD-10-CM | POA: Diagnosis not present

## 2015-10-02 DIAGNOSIS — I5042 Chronic combined systolic (congestive) and diastolic (congestive) heart failure: Secondary | ICD-10-CM | POA: Diagnosis present

## 2015-10-02 DIAGNOSIS — Z8673 Personal history of transient ischemic attack (TIA), and cerebral infarction without residual deficits: Secondary | ICD-10-CM | POA: Diagnosis not present

## 2015-10-02 DIAGNOSIS — H5442 Blindness, left eye, normal vision right eye: Secondary | ICD-10-CM | POA: Diagnosis present

## 2015-10-02 DIAGNOSIS — N400 Enlarged prostate without lower urinary tract symptoms: Secondary | ICD-10-CM | POA: Diagnosis present

## 2015-10-02 DIAGNOSIS — I248 Other forms of acute ischemic heart disease: Secondary | ICD-10-CM | POA: Diagnosis present

## 2015-10-02 DIAGNOSIS — I714 Abdominal aortic aneurysm, without rupture: Secondary | ICD-10-CM | POA: Diagnosis present

## 2015-10-02 DIAGNOSIS — E440 Moderate protein-calorie malnutrition: Secondary | ICD-10-CM | POA: Diagnosis present

## 2015-10-02 DIAGNOSIS — A4151 Sepsis due to Escherichia coli [E. coli]: Principal | ICD-10-CM | POA: Diagnosis present

## 2015-10-02 DIAGNOSIS — D696 Thrombocytopenia, unspecified: Secondary | ICD-10-CM | POA: Diagnosis present

## 2015-10-02 DIAGNOSIS — I480 Paroxysmal atrial fibrillation: Secondary | ICD-10-CM | POA: Diagnosis present

## 2015-10-02 DIAGNOSIS — Z87891 Personal history of nicotine dependence: Secondary | ICD-10-CM | POA: Diagnosis not present

## 2015-10-02 DIAGNOSIS — Z7952 Long term (current) use of systemic steroids: Secondary | ICD-10-CM

## 2015-10-02 DIAGNOSIS — H9190 Unspecified hearing loss, unspecified ear: Secondary | ICD-10-CM

## 2015-10-02 DIAGNOSIS — A419 Sepsis, unspecified organism: Secondary | ICD-10-CM | POA: Diagnosis present

## 2015-10-02 DIAGNOSIS — Z96642 Presence of left artificial hip joint: Secondary | ICD-10-CM | POA: Diagnosis present

## 2015-10-02 DIAGNOSIS — I2510 Atherosclerotic heart disease of native coronary artery without angina pectoris: Secondary | ICD-10-CM | POA: Diagnosis present

## 2015-10-02 DIAGNOSIS — I110 Hypertensive heart disease with heart failure: Secondary | ICD-10-CM | POA: Diagnosis present

## 2015-10-02 DIAGNOSIS — I25810 Atherosclerosis of coronary artery bypass graft(s) without angina pectoris: Secondary | ICD-10-CM | POA: Diagnosis present

## 2015-10-02 DIAGNOSIS — M069 Rheumatoid arthritis, unspecified: Secondary | ICD-10-CM | POA: Diagnosis present

## 2015-10-02 DIAGNOSIS — I5043 Acute on chronic combined systolic (congestive) and diastolic (congestive) heart failure: Secondary | ICD-10-CM | POA: Diagnosis present

## 2015-10-02 DIAGNOSIS — R509 Fever, unspecified: Secondary | ICD-10-CM | POA: Diagnosis present

## 2015-10-02 LAB — PROTIME-INR
INR: 1.58 — AB (ref 0.00–1.49)
PROTHROMBIN TIME: 18.9 s — AB (ref 11.6–15.2)

## 2015-10-02 LAB — CBC WITH DIFFERENTIAL/PLATELET
BASOS ABS: 0 10*3/uL (ref 0.0–0.1)
BASOS PCT: 0 %
EOS ABS: 0.1 10*3/uL (ref 0.0–0.7)
Eosinophils Relative: 1 %
HCT: 37.9 % — ABNORMAL LOW (ref 39.0–52.0)
HEMOGLOBIN: 12.2 g/dL — AB (ref 13.0–17.0)
LYMPHS ABS: 1 10*3/uL (ref 0.7–4.0)
LYMPHS PCT: 9 %
MCH: 30.5 pg (ref 26.0–34.0)
MCHC: 32.2 g/dL (ref 30.0–36.0)
MCV: 94.8 fL (ref 78.0–100.0)
MONO ABS: 0.1 10*3/uL (ref 0.1–1.0)
Monocytes Relative: 1 %
NEUTROS ABS: 9.5 10*3/uL — AB (ref 1.7–7.7)
NEUTROS PCT: 89 %
PLATELETS: 112 10*3/uL — AB (ref 150–400)
RBC: 4 MIL/uL — ABNORMAL LOW (ref 4.22–5.81)
RDW: 16.6 % — AB (ref 11.5–15.5)
WBC Morphology: INCREASED
WBC: 10.7 10*3/uL — ABNORMAL HIGH (ref 4.0–10.5)

## 2015-10-02 LAB — MRSA PCR SCREENING: MRSA by PCR: NEGATIVE

## 2015-10-02 LAB — COMPREHENSIVE METABOLIC PANEL WITH GFR
ALT: 17 U/L (ref 17–63)
AST: 31 U/L (ref 15–41)
Albumin: 3.5 g/dL (ref 3.5–5.0)
Alkaline Phosphatase: 69 U/L (ref 38–126)
Anion gap: 10 (ref 5–15)
BUN: 28 mg/dL — ABNORMAL HIGH (ref 6–20)
CO2: 24 mmol/L (ref 22–32)
Calcium: 8.8 mg/dL — ABNORMAL LOW (ref 8.9–10.3)
Chloride: 97 mmol/L — ABNORMAL LOW (ref 101–111)
Creatinine, Ser: 1.15 mg/dL (ref 0.61–1.24)
GFR calc Af Amer: >60 mL/min
GFR calc non Af Amer: 56 mL/min — ABNORMAL LOW
Glucose, Bld: 86 mg/dL (ref 65–99)
Potassium: 4 mmol/L (ref 3.5–5.1)
Sodium: 131 mmol/L — ABNORMAL LOW (ref 135–145)
Total Bilirubin: 0.6 mg/dL (ref 0.3–1.2)
Total Protein: 7 g/dL (ref 6.5–8.1)

## 2015-10-02 LAB — BLOOD CULTURE ID PANEL (REFLEXED)
ACINETOBACTER BAUMANNII: NOT DETECTED
CANDIDA KRUSEI: NOT DETECTED
CANDIDA PARAPSILOSIS: NOT DETECTED
CARBAPENEM RESISTANCE: NOT DETECTED
Candida albicans: NOT DETECTED
Candida glabrata: NOT DETECTED
Candida tropicalis: NOT DETECTED
ENTEROBACTERIACEAE SPECIES: DETECTED — AB
ENTEROCOCCUS SPECIES: NOT DETECTED
Enterobacter cloacae complex: NOT DETECTED
Escherichia coli: DETECTED — AB
Haemophilus influenzae: NOT DETECTED
KLEBSIELLA OXYTOCA: NOT DETECTED
KLEBSIELLA PNEUMONIAE: NOT DETECTED
LISTERIA MONOCYTOGENES: NOT DETECTED
Methicillin resistance: NOT DETECTED
Neisseria meningitidis: NOT DETECTED
PSEUDOMONAS AERUGINOSA: NOT DETECTED
Proteus species: NOT DETECTED
SERRATIA MARCESCENS: NOT DETECTED
STAPHYLOCOCCUS AUREUS BCID: NOT DETECTED
STAPHYLOCOCCUS SPECIES: NOT DETECTED
STREPTOCOCCUS AGALACTIAE: NOT DETECTED
Streptococcus pneumoniae: NOT DETECTED
Streptococcus pyogenes: NOT DETECTED
Streptococcus species: NOT DETECTED
VANCOMYCIN RESISTANCE: NOT DETECTED

## 2015-10-02 LAB — URINALYSIS, ROUTINE W REFLEX MICROSCOPIC
BILIRUBIN URINE: NEGATIVE
Glucose, UA: NEGATIVE mg/dL
KETONES UR: NEGATIVE mg/dL
NITRITE: NEGATIVE
PH: 5.5 (ref 5.0–8.0)
PROTEIN: 100 mg/dL — AB
Specific Gravity, Urine: 1.023 (ref 1.005–1.030)

## 2015-10-02 LAB — I-STAT TROPONIN, ED: TROPONIN I, POC: 0.22 ng/mL — AB (ref 0.00–0.08)

## 2015-10-02 LAB — I-STAT CG4 LACTIC ACID, ED
Lactic Acid, Venous: 3.3 mmol/L (ref 0.5–1.9)
Lactic Acid, Venous: 2.86 mmol/L (ref 0.5–1.9)

## 2015-10-02 LAB — APTT: APTT: 47 s — AB (ref 24–37)

## 2015-10-02 LAB — URINE MICROSCOPIC-ADD ON

## 2015-10-02 LAB — TROPONIN I: TROPONIN I: 2.8 ng/mL — AB (ref <0.03)

## 2015-10-02 LAB — LACTIC ACID, PLASMA
LACTIC ACID, VENOUS: 1.8 mmol/L (ref 0.5–1.9)
Lactic Acid, Venous: 1.9 mmol/L (ref 0.5–1.9)

## 2015-10-02 LAB — MAGNESIUM: MAGNESIUM: 1.6 mg/dL — AB (ref 1.7–2.4)

## 2015-10-02 LAB — CORTISOL: CORTISOL PLASMA: 14.5 ug/dL

## 2015-10-02 LAB — PHOSPHORUS: Phosphorus: 2.9 mg/dL (ref 2.5–4.6)

## 2015-10-02 LAB — PROCALCITONIN: PROCALCITONIN: 75.94 ng/mL

## 2015-10-02 MED ORDER — HEPARIN SODIUM (PORCINE) 5000 UNIT/ML IJ SOLN
5000.0000 [IU] | Freq: Three times a day (TID) | INTRAMUSCULAR | Status: DC
  Administered 2015-10-02 – 2015-10-06 (×12): 5000 [IU] via SUBCUTANEOUS
  Filled 2015-10-02 (×11): qty 1

## 2015-10-02 MED ORDER — SODIUM CHLORIDE 0.9 % IV SOLN
INTRAVENOUS | Status: DC
Start: 2015-10-02 — End: 2015-10-02
  Administered 2015-10-02: 13:00:00 via INTRAVENOUS

## 2015-10-02 MED ORDER — SODIUM CHLORIDE 0.9 % IV BOLUS (SEPSIS)
1000.0000 mL | Freq: Once | INTRAVENOUS | Status: AC
  Administered 2015-10-02: 1000 mL via INTRAVENOUS

## 2015-10-02 MED ORDER — DEXTROSE 5 % IV SOLN
1.0000 g | INTRAVENOUS | Status: DC
  Filled 2015-10-02: qty 10

## 2015-10-02 MED ORDER — SODIUM CHLORIDE 0.9% FLUSH
3.0000 mL | Freq: Two times a day (BID) | INTRAVENOUS | Status: DC
  Administered 2015-10-02 – 2015-10-03 (×3): 3 mL via INTRAVENOUS
  Administered 2015-10-04: 23:00:00 via INTRAVENOUS
  Administered 2015-10-05 – 2015-10-06 (×2): 3 mL via INTRAVENOUS

## 2015-10-02 MED ORDER — VANCOMYCIN HCL IN DEXTROSE 1-5 GM/200ML-% IV SOLN
1000.0000 mg | Freq: Once | INTRAVENOUS | Status: AC
Start: 2015-10-02 — End: 2015-10-02
  Administered 2015-10-02: 1000 mg via INTRAVENOUS
  Filled 2015-10-02: qty 200

## 2015-10-02 MED ORDER — ACETAMINOPHEN 650 MG RE SUPP: 650.0000 mg | Freq: Four times a day (QID) | RECTAL | Status: DC | PRN

## 2015-10-02 MED ORDER — ACETAMINOPHEN 325 MG PO TABS
650.0000 mg | ORAL_TABLET | Freq: Four times a day (QID) | ORAL | Status: DC | PRN
  Administered 2015-10-02 – 2015-10-05 (×6): 650 mg via ORAL
  Filled 2015-10-02 (×5): qty 2

## 2015-10-02 MED ORDER — LEVOTHYROXINE SODIUM 50 MCG PO TABS
50.0000 ug | ORAL_TABLET | Freq: Every day | ORAL | Status: DC
  Administered 2015-10-03 – 2015-10-06 (×4): 50 ug via ORAL
  Filled 2015-10-02 (×4): qty 1

## 2015-10-02 MED ORDER — HYDROCORTISONE NA SUCCINATE PF 100 MG IJ SOLR
50.0000 mg | Freq: Three times a day (TID) | INTRAMUSCULAR | Status: AC
  Administered 2015-10-02 – 2015-10-03 (×4): 50 mg via INTRAVENOUS
  Filled 2015-10-02 (×4): qty 2

## 2015-10-02 MED ORDER — DEXTROSE 5 % IV SOLN
2.0000 g | INTRAVENOUS | Status: DC
  Administered 2015-10-03 – 2015-10-06 (×4): 2 g via INTRAVENOUS
  Filled 2015-10-02 (×4): qty 2

## 2015-10-02 MED ORDER — PIPERACILLIN-TAZOBACTAM 3.375 G IVPB
3.3750 g | Freq: Once | INTRAVENOUS | Status: AC
  Administered 2015-10-02: 3.375 g via INTRAVENOUS
  Filled 2015-10-02: qty 50

## 2015-10-02 MED ORDER — ACETAMINOPHEN 325 MG PO TABS
650.0000 mg | ORAL_TABLET | Freq: Once | ORAL | Status: AC | PRN
  Administered 2015-10-02: 650 mg via ORAL
  Filled 2015-10-02: qty 2

## 2015-10-02 MED ORDER — SODIUM CHLORIDE 0.9 % IV SOLN
INTRAVENOUS | Status: DC
  Administered 2015-10-02 – 2015-10-06 (×3): via INTRAVENOUS

## 2015-10-02 MED ORDER — DEXTROSE 5 % IV SOLN: 2.0000 g | Freq: Once | INTRAVENOUS | Status: DC

## 2015-10-02 NOTE — Progress Notes (Signed)
PHARMACY - PHYSICIAN COMMUNICATION CRITICAL VALUE ALERT - BLOOD CULTURE IDENTIFICATION (BCID)  Results for orders placed or performed during the hospital encounter of 10/02/15  Blood Culture ID Panel (Reflexed) (Collected: 10/02/2015  7:50 AM)  Result Value Ref Range   Enterococcus species NOT DETECTED NOT DETECTED   Vancomycin resistance NOT DETECTED NOT DETECTED   Listeria monocytogenes NOT DETECTED NOT DETECTED   Staphylococcus species NOT DETECTED NOT DETECTED   Staphylococcus aureus NOT DETECTED NOT DETECTED   Methicillin resistance NOT DETECTED NOT DETECTED   Streptococcus species NOT DETECTED NOT DETECTED   Streptococcus agalactiae NOT DETECTED NOT DETECTED   Streptococcus pneumoniae NOT DETECTED NOT DETECTED   Streptococcus pyogenes NOT DETECTED NOT DETECTED   Acinetobacter baumannii NOT DETECTED NOT DETECTED   Enterobacteriaceae species DETECTED (A) NOT DETECTED   Enterobacter cloacae complex NOT DETECTED NOT DETECTED   Escherichia coli DETECTED (A) NOT DETECTED   Klebsiella oxytoca NOT DETECTED NOT DETECTED   Klebsiella pneumoniae NOT DETECTED NOT DETECTED   Proteus species NOT DETECTED NOT DETECTED   Serratia marcescens NOT DETECTED NOT DETECTED   Carbapenem resistance NOT DETECTED NOT DETECTED   Haemophilus influenzae NOT DETECTED NOT DETECTED   Neisseria meningitidis NOT DETECTED NOT DETECTED   Pseudomonas aeruginosa NOT DETECTED NOT DETECTED   Candida albicans NOT DETECTED NOT DETECTED   Candida glabrata NOT DETECTED NOT DETECTED   Candida krusei NOT DETECTED NOT DETECTED   Candida parapsilosis NOT DETECTED NOT DETECTED   Candida tropicalis NOT DETECTED NOT DETECTED    Name of physician (or Provider) Contacted: M. Lynch via amion  Changes to prescribed antibiotics required: Rocephin increased to 2 grams iv Q 24 hours  Elwin Sleight 10/02/2015  10:41 PM

## 2015-10-02 NOTE — ED Provider Notes (Signed)
CSN: [id number]     Arrival date & time 10/02/15  7322 History   First MD Initiated Contact with Patient 10/02/15 [phone]     Chief Complaint  Patient presents with  . Fever     (Consider location/radiation/quality/duration/timing/severity/associated sxs/prior Treatment) HPI Comments: Patient is an 82 year old male with extensive past medical history including coronary artery disease with CABG, valve replacement, renal insufficiency, hypertension. He presents for evaluation of "not feeling well". He started yesterday with generalized malaise, congestion, and cough. He denies having a fever at home, but is febrile in the ER with a temp of 104. He denies any vomiting or diarrhea. He denies any urinary complaints.  Patient is a 82 y.o. male presenting with fever. The history is provided by the patient and a relative.  Fever Temp source:  Subjective Severity:  Moderate Onset quality:  Sudden Timing:  Constant Chronicity:  New Relieved by:  Nothing Worsened by:  Nothing tried Ineffective treatments:  None tried Associated symptoms: congestion and cough     Past Medical History  Diagnosis Date  . Hypertension   . GERD (gastroesophageal reflux disease)   . Aortic stenosis, severe     a. s/p TAVR  . Embolism involving retinal artery   . BPH (benign prostatic hypertrophy)     Dr. Retta Diones  . Iliac artery aneurysm (HCC)     Dr. Edilia Bo  . AAA (abdominal aortic aneurysm) (HCC)     Dr. Ashley Royalty  . Diastolic heart failure (HCC)   . Coronary artery disease     Left main disease and severe 3-vessel CAD  . CAD (coronary artery disease) of bypass graft     Chronic occlusion of saphenous vein graft to RCA  . Hypothyroidism   . Stroke [facility])     mini stroke effective his eye left  . 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 12/2012: mm Stephannie Peters XT transcatheter heart valve placed via transapical approach  . PAF (paroxysmal atrial fibrillation) (HCC)   .  Blind left eye    Past Surgical History  Procedure Laterality Date  . Hemiarthroplasty hip Left     AFTER HIP FX  . Total knee arthroplasty  1992-2002    right-left  . Biopsy prostate  2005  . Transurethral resection of prostate  2012  . Joint replacement    . Harvest bone graft  1982    FOR THE  ANKLE  . Skin grafts  1968  . Colonoscopy    . Bunionectomy with hammertoe reconstruction Bilateral   . Transcatheter aortic valve replacement, transapical N/A 01/15/2013    Procedure: TRANSCATHETER AORTIC VALVE REPLACEMENT, TRANSAPICAL;  Surgeon: [name], MD;  Location: [facility] OR;  Service: Open Heart Surgery;  Laterality: N/A;  . Intraoperative transesophageal echocardiogram N/A 01/15/2013    Procedure: INTRAOPERATIVE TRANSESOPHAGEAL ECHOCARDIOGRAM;  Surgeon: [name], MD;  Location: [facility] OR;  Service: Open Heart Surgery;  Laterality: N/A;  . Left and right heart catheterization with coronary angiogram N/A 12/20/2012    Procedure: LEFT AND RIGHT HEART CATHETERIZATION WITH CORONARY ANGIOGRAM;  Surgeon: Lesleigh Noe, MD;  Location: [facility] CATH LAB;  Service: Cardiovascular;  Laterality: N/A;  . Carotid endarterectomy Right Jan. 2, 1997  . Cardiac catheterization    . Total hip arthroplasty Left 2007  . Cataract extraction w/ intraocular lens implant Right   . Coronary artery bypass graft  01/1998    Dr. Sheliah Plane; LIMA to LAD, SVG to D1, SVG to  LCx, SVG to RCA, open saphenous vein harvest via right lower extremity  . Cholecystectomy N/A 01/28/2015    Procedure: LAPAROSCOPIC CHOLECYSTECTOMY WITH INTRAOPERATIVE CHOLANGIOGRAM;  Surgeon: Gaynelle Adu, MD;  Location: [facility] OR;  Service: General;  Laterality: N/A;  . Cardiac catheterization N/A 05/07/2015    Procedure: Right Heart Cath and Coronary/Graft Angiography;  Surgeon: Lyn Records, MD;  Location: [facility] INVASIVE CV LAB;  Service: Cardiovascular;  Laterality: N/A;   Family History  Problem Relation Age of Onset  . Heart attack Mother    . Heart disease Mother     Before age 65  . Hyperlipidemia Mother   . Hypertension Mother   . Hypertension Son   . Heart disease Father     AAA-   . Hyperlipidemia Father   . Heart attack Father   . Hypertension Father   . Heart disease Brother     Before age 62  . Hyperlipidemia Brother   . Heart attack Brother   . Hypertension Brother    Social History  Substance Use Topics  . Smoking status: Former Smoker -- 0.50 packs/day for 47 years    Types: Cigarettes    Quit date: 03/28/1958  . Smokeless tobacco: Former Neurosurgeon    Types: Chew     Comment: ONLY USED 2 YEARS  . Alcohol Use: No    Review of Systems  Constitutional: Positive for fever.  HENT: Positive for congestion.   Respiratory: Positive for cough.   All other systems reviewed and are negative.     Allergies  Ciprofloxacin  Home Medications   Prior to Admission medications   Medication Sig Start Date End Date Taking? Authorizing Provider  acetaminophen (TYLENOL) 325 MG tablet Take 2 tablets (650 mg total) by mouth every 6 (six) hours as needed for mild pain, moderate pain, fever or headache. 02/03/15   Elease Etienne, MD  amLODipine (NORVASC) 5 MG tablet Take 0.5 tablets (2.5 mg total) by mouth daily at 3 pm. 02/03/15   Elease Etienne, MD  aspirin EC 81 MG EC tablet Take 1 tablet (81 mg total) by mouth daily. 01/21/13   Donielle Margaretann Loveless, PA-C  carvedilol (COREG) 6.25 MG tablet Take 1 tablet (6.25 mg total) by mouth 2 (two) times daily with a meal. 05/11/15   Zannie Cove, MD  folic acid (FOLVITE) 1 MG tablet Take 3 mg by mouth daily.    Historical Provider, MD  furosemide (LASIX) 20 MG tablet Take 2 tablets (40 mg total) by mouth daily. 05/11/15   Zannie Cove, MD  levothyroxine (SYNTHROID, LEVOTHROID) 50 MCG tablet Take 50 mcg by mouth daily. 11/06/14   Historical Provider, MD  methotrexate 2.5 MG tablet Take 25 mg by mouth every Friday.     Historical Provider, MD  nitroGLYCERIN (NITROSTAT) 0.4 MG SL  tablet Place 1 tablet (0.4 mg total) under the tongue every 5 (five) minutes as needed for chest pain. 06/19/15   Lyn Records, MD  omeprazole (PRILOSEC) 20 MG capsule Take 20 mg by mouth daily.    Historical Provider, MD  predniSONE (DELTASONE) 5 MG tablet Take 5 mg by mouth daily. 04/07/15   Historical Provider, MD  simvastatin (ZOCOR) 20 MG tablet Take 20 mg by mouth every evening. 12/20/14   Historical Provider, MD   BP 121/63 mmHg  Pulse 101  Temp(Src) 104.2 F (40.1 C) (Rectal)  Resp 23  Ht 5\' 6"  (1.676 m)  Wt 136 lb (61.689 kg)  BMI 21.96 kg/m2  SpO2  100% Physical Exam  Constitutional: He is oriented to person, place, and time. He appears well-developed and well-nourished. No distress.  HENT:  Head: Normocephalic and atraumatic.  Mouth/Throat: Oropharynx is clear and moist.  Neck: Normal range of motion. Neck supple.  Cardiovascular: Normal rate and regular rhythm.  Exam reveals no friction rub.   No murmur heard. Pulmonary/Chest: Effort normal. No respiratory distress. He has wheezes. He has no rales.  There are rhonchorous breath sounds bilaterally.  Abdominal: Soft. Bowel sounds are normal. He exhibits no distension. There is no tenderness.  Musculoskeletal: Normal range of motion. He exhibits no edema.  Neurological: He is alert and oriented to person, place, and time. Coordination normal.  Skin: Skin is warm and dry. He is not diaphoretic.  Nursing note and vitals reviewed.   ED Course  Procedures (including critical care time) Labs Review Labs Reviewed  I-STAT CG4 LACTIC ACID, ED - Abnormal; Notable for the following:    Lactic Acid, Venous 3.30 (*)    All other components within normal limits  CULTURE, BLOOD (ROUTINE X 2)  CULTURE, BLOOD (ROUTINE X 2)  URINE CULTURE  COMPREHENSIVE METABOLIC PANEL  CBC WITH DIFFERENTIAL/PLATELET  URINALYSIS, ROUTINE W REFLEX MICROSCOPIC (NOT AT [facility])    Imaging Review No results found. I have personally reviewed and  evaluated these images and lab results as part of my medical decision-making.   EKG Interpretation   Date/Time:  Friday October 02 2015 07:42:44 EDT Ventricular Rate:  101 PR Interval:    QRS Duration: 182 QT Interval:  414 QTC Calculation: 537 R Axis:   -62 Text Interpretation:  Sinus or ectopic atrial tachycardia IVCD, consider  atypical RBBB LVH with secondary repolarization abnormality Probable  anterolateral infarct, acute Confirmed by Mariem Skolnick  MD, Corissa Oguinn ([id number]) on  10/02/2015 7:58:37 AM      MDM   Final diagnoses:  None    Patient is an 82 year old male who presents with complaints of weakness. He was found to have a fever of 104, elevated white count, and evidence for urinary tract infection. He was promptly administered vancomycin and Zosyn for presumed sepsis. He was given the appropriate boluses of fluid, however remains hypotensive. I've spoken with the hospitalist service who is uncomfortable with the patient being on the stepdown unit secondary to his persistent hypotension. I've spoken with critical care who was evaluated and consult on the patient as well. We have recommended additional fluids which were given. His blood pressure has now improved somewhat after the third liter of fluid. They feel as though he is appropriate for admission to the medicine service.  CRITICAL CARE Performed by: Geoffery Lyons Total critical care time: 70 minutes Critical care time was exclusive of separately billable procedures and treating other patients. Critical care was necessary to treat or prevent imminent or life-threatening deterioration. Critical care was time spent personally by me on the following activities: development of treatment plan with patient and/or surrogate as well as nursing, discussions with consultants, evaluation of patient's response to treatment, examination of patient, obtaining history from patient or surrogate, ordering and performing treatments and interventions,  ordering and review of laboratory studies, ordering and review of radiographic studies, pulse oximetry and re-evaluation of patient's condition.     Geoffery Lyons, MD 10/02/15 1501

## 2015-10-02 NOTE — ED Notes (Signed)
MD at bedside. 

## 2015-10-02 NOTE — Progress Notes (Signed)
CRITICAL VALUE ALERT  Critical value received:  Troponin 2.80  Date of notification:  10/02/2015   Time of notification:  19:54  Critical value read back:Yes.     Nurse who received alert: Peyton Bottoms, RN   MD notified (1st page):  [facility] Floor Coverage  Time of first page:  20:00  Responding MD: M. Lynch   Time MD responded:  20:01  MD notified of critical value and pt's clinical status. MD said to wait and see what the next troponin is. No further orders at this time.   Peyton Bottoms, RN 8:37 PM 10/02/2015

## 2015-10-02 NOTE — H&P (Signed)
History and Physical    Hunter Choi TDV:[id number] DOB: 1927-05-22 DOA: 10/02/2015   PCP: Clelia Schaumann, MD   Patient coming from/Resides with: Private residence/liVEs with wife and adult son  Chief Complaint: Mental status and high fever  HPI: Hunter Choi is a 82 y.o. male with medical history significant for ischemic cardiomyopathy with chronic combined systolic and diastolic heart failure with an EF of 20-25%, history of severe aortic stenosis status post T aVR, hypertension, rheumatoid arthritis on low-dose chronic prednisone and methotrexate, chronic thrombocytopenia, chronic anemia, BPH, malnutrition and significant hearing loss. Patient was in his usual state of health until early this morning about 3 AM when the patient's son noticed he was coughing which is usually an indication he is trying to induce vomiting. He was concerned so went to check on his father and noticed that he was confused and felt warm. When he checked an oral temperature it was 103F. At this point he noticed the patient was having chills and rigors. Patient had no other complaints including no abdominal pain. The only plate reported by the patient when he presented to the ER was "I don't feel good"  ED Course:  Vital signs: Temperature 104.2-BP 123/67-pulse 103-respirations 22-room air saturations 98% Repeat vital signs: Have been somewhat labile with systolic blood pressures as low as the 70s despite 2 L of IV fluids; good improvement in blood pressure with systolic greater than 90 after a total of 3 L normal saline boluses infused Portable chest x-ray: Acute findings, no evidence of pneumonia Lab data: Sodium 131, potassium 4.0, BUN 28, creatinine 1.15, LFTs normal, poc troponin 0.22, initial lactic acid 3.30 with repeat lactic acid after volume resuscitation 2.86, white count 10,700 with elevated neutrophils 89% and absolute neutrophils 9.5%, hemoglobin 12.2, platelets 112,000; PT 18.9, INR 1.58, PTT 47,  urinalysis appears consistent with UTI with turbid appearance, many bacteria, large hemoglobin, large leukocytes, nitrite negative, protein 100, RBCs too numerous to count to be BCC numerous to count; blood cultures and urine culture obtained in ER Medications and treatments: Tylenol 650 mg by mouth 2, vancomycin 1 g IV 1, Zosyn 3.375 g IV 1, normal saline bolus 3 L  Review of Systems:  In addition to the HPI above,  No Headache, changes with Vision or hearing, new weakness, tingling, numbness in any extremity, No problems swallowing food or Liquids, indigestion/reflux No Chest pain, Cough or Shortness of Breath, palpitations, orthopnea or DOE No Abdominal pain, N/V; no melena or hematochezia, no dark tarry stools, Bowel movements are regular, No dysuria, hematuria or flank pain No new skin rashes, lesions, masses or bruises, No new joints pains-aches No recent weight gain or loss No polyuria, polydypsia or polyphagia,   Past Medical History  Diagnosis Date  . Hypertension   . GERD (gastroesophageal reflux disease)   . Aortic stenosis, severe     a. s/p TAVR  . Embolism involving retinal artery   . BPH (benign prostatic hypertrophy)     Dr. Retta Diones  . Iliac artery aneurysm (HCC)     Dr. Edilia Bo  . AAA (abdominal aortic aneurysm) (HCC)     Dr. Ashley Royalty  . Diastolic heart failure (HCC)   . Coronary artery disease     Left main disease and severe 3-vessel CAD  . CAD (coronary artery disease) of bypass graft     Chronic occlusion of saphenous vein graft to RCA  . Hypothyroidism   . Stroke [facility])     mini stroke  effective his eye left  . 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 12/2012: mm Stephannie Peters XT transcatheter heart valve placed via transapical approach  . PAF (paroxysmal atrial fibrillation) (HCC)   . Blind left eye     Past Surgical History  Procedure Laterality Date  . Hemiarthroplasty hip Left     AFTER HIP FX  . Total knee  arthroplasty  1992-2002    right-left  . Biopsy prostate  2005  . Transurethral resection of prostate  2012  . Joint replacement    . Harvest bone graft  1982    FOR THE  ANKLE  . Skin grafts  1968  . Colonoscopy    . Bunionectomy with hammertoe reconstruction Bilateral   . Transcatheter aortic valve replacement, transapical N/A 01/15/2013    Procedure: TRANSCATHETER AORTIC VALVE REPLACEMENT, TRANSAPICAL;  Surgeon: [name], MD;  Location: [facility] OR;  Service: Open Heart Surgery;  Laterality: N/A;  . Intraoperative transesophageal echocardiogram N/A 01/15/2013    Procedure: INTRAOPERATIVE TRANSESOPHAGEAL ECHOCARDIOGRAM;  Surgeon: [name], MD;  Location: [facility] OR;  Service: Open Heart Surgery;  Laterality: N/A;  . Left and right heart catheterization with coronary angiogram N/A 12/20/2012    Procedure: LEFT AND RIGHT HEART CATHETERIZATION WITH CORONARY ANGIOGRAM;  Surgeon: Lesleigh Noe, MD;  Location: [facility] CATH LAB;  Service: Cardiovascular;  Laterality: N/A;  . Carotid endarterectomy Right Jan. 2, 1997  . Cardiac catheterization    . Total hip arthroplasty Left 2007  . Cataract extraction w/ intraocular lens implant Right   . Coronary artery bypass graft  01/1998    Dr. Sheliah Plane; LIMA to LAD, SVG to D1, SVG to LCx, SVG to RCA, open saphenous vein harvest via right lower extremity  . Cholecystectomy N/A 01/28/2015    Procedure: LAPAROSCOPIC CHOLECYSTECTOMY WITH INTRAOPERATIVE CHOLANGIOGRAM;  Surgeon: Gaynelle Adu, MD;  Location: [facility] OR;  Service: General;  Laterality: N/A;  . Cardiac catheterization N/A 05/07/2015    Procedure: Right Heart Cath and Coronary/Graft Angiography;  Surgeon: Lyn Records, MD;  Location: [facility] INVASIVE CV LAB;  Service: Cardiovascular;  Laterality: N/A;    Social History   Social History  . Marital Status: Married    Spouse Name: N/A  . Number of Children: N/A  . Years of Education: N/A   Occupational History  . Not on file.   Social History  Main Topics  . Smoking status: Former Smoker -- 0.50 packs/day for 47 years    Types: Cigarettes    Quit date: 03/28/1958  . Smokeless tobacco: Former Neurosurgeon    Types: Chew     Comment: ONLY USED 2 YEARS  . Alcohol Use: No  . Drug Use: No  . Sexual Activity: Not on file   Other Topics Concern  . Not on file   Social History Narrative    Mobility: With assistive devices Work history: Retired   Allergies  Allergen Reactions  . Ciprofloxacin Other (See Comments)    REACTION: mild delirium    Family History  Problem Relation Age of Onset  . Heart attack Mother   . Heart disease Mother     Before age 60  . Hyperlipidemia Mother   . Hypertension Mother   . Hypertension Son   . Heart disease Father     AAA-   . Hyperlipidemia Father   . Heart attack Father   . Hypertension Father   . Heart disease Brother     Before  age 73  . Hyperlipidemia Brother   . Heart attack Brother   . Hypertension Brother      Prior to Admission medications   Medication Sig Start Date End Date Taking? Authorizing Provider  acetaminophen (TYLENOL) 325 MG tablet Take 2 tablets (650 mg total) by mouth every 6 (six) hours as needed for mild pain, moderate pain, fever or headache. 02/03/15  Yes Elease Etienne, MD  amLODipine (NORVASC) 5 MG tablet Take 0.5 tablets (2.5 mg total) by mouth daily at 3 pm. Patient taking differently: Take 5 mg by mouth daily at 3 pm.  02/03/15  Yes Elease Etienne, MD  aspirin EC 81 MG EC tablet Take 1 tablet (81 mg total) by mouth daily. 01/21/13  Yes Donielle Margaretann Loveless, PA-C  carvedilol (COREG) 6.25 MG tablet Take 1 tablet (6.25 mg total) by mouth 2 (two) times daily with a meal. 05/11/15  Yes Zannie Cove, MD  folic acid (FOLVITE) 1 MG tablet Take 3 mg by mouth daily.   Yes Historical Provider, MD  furosemide (LASIX) 20 MG tablet Take 2 tablets (40 mg total) by mouth daily. Patient taking differently: Take 20 mg by mouth 2 (two) times daily.  05/11/15  Yes Zannie Cove, MD  levothyroxine (SYNTHROID, LEVOTHROID) 50 MCG tablet Take 50 mcg by mouth daily. 11/06/14  Yes Historical Provider, MD  MELATONIN PO Take 1 tablet by mouth daily.   Yes Historical Provider, MD  methotrexate 2.5 MG tablet Take 25 mg by mouth every Friday.    Yes Historical Provider, MD  nitroGLYCERIN (NITROSTAT) 0.4 MG SL tablet Place 1 tablet (0.4 mg total) under the tongue every 5 (five) minutes as needed for chest pain. 06/19/15  Yes Lyn Records, MD  omeprazole (PRILOSEC) 20 MG capsule Take 20 mg by mouth daily.   Yes Historical Provider, MD  predniSONE (DELTASONE) 5 MG tablet Take 5 mg by mouth daily. 04/07/15  Yes Historical Provider, MD  simvastatin (ZOCOR) 20 MG tablet Take 20 mg by mouth every evening. 12/20/14  Yes Historical Provider, MD    Physical Exam: Filed Vitals:   10/02/15 1530 10/02/15 1545 10/02/15 1600 10/02/15 1615  BP: 98/52 110/47 107/41 105/44  Pulse: 74 68 65 65  Temp:  100 F (37.8 C)    TempSrc:  Oral    Resp: 18 20 13 18   Height:      Weight:      SpO2: 95% 97% 93% 96%      Constitutional: NAD, calm, comfortable Eyes: PERRL, lids and conjunctivae normal ENMT: Mucous membranes are Dry. Posterior pharynx clear of any exudate or lesions.Normal dentition.  Neck: normal, supple, no masses, no thyromegaly Respiratory: clear to auscultation bilaterally although a few faint crackles right side noting patient also positioned on the right side. Normal respiratory effort. No accessory muscle use. Room air Cardiovascular: Regular rate and rhythm, no murmurs / rubs / gallops. No extremity edema. 2+ pedal pulses. No carotid bruits.  Abdomen: no tenderness, no masses palpated. No hepatosplenomegaly. Bowel sounds positive.  Musculoskeletal: no clubbing / cyanosis. No joint deformity upper and lower extremities. Good ROM, no contractures. Normal muscle tone.  Skin: no rashes, lesions, ulcers. No induration warm to touch with good capillary refill Neurologic: CN  2-12 grossly intact set for previous documented significant hearing loss. Sensation intact, DTR normal. Strength 5/5 x all 4 extremities.  Psychiatric: Alert and oriented x at least name although exam is difficult to obtain due to patient's significant hearing loss. Normal  mood.    Labs on Admission: I have personally reviewed following labs and imaging studies  CBC:  Recent Labs Lab 10/02/15 0800  WBC 10.7*  NEUTROABS 9.5*  HGB 12.2*  HCT 37.9*  MCV 94.8  PLT 112*   Basic Metabolic Panel:  Recent Labs Lab 10/02/15 0800  NA 131*  K 4.0  CL 97*  CO2 24  GLUCOSE 86  BUN 28*  CREATININE 1.15  CALCIUM 8.8*   GFR: Estimated Creatinine Clearance: 40.1 mL/min (by C-G formula based on Cr of 1.15). Liver Function Tests:  Recent Labs Lab 10/02/15 0800  AST 31  ALT 17  ALKPHOS 69  BILITOT 0.6  PROT 7.0  ALBUMIN 3.5   No results for input(s): LIPASE, AMYLASE in the last 168 hours. No results for input(s): AMMONIA in the last 168 hours. Coagulation Profile:  Recent Labs Lab 10/02/15 1612  INR 1.58*   Cardiac Enzymes: No results for input(s): CKTOTAL, CKMB, CKMBINDEX, TROPONINI in the last 168 hours. BNP (last 3 results) No results for input(s): PROBNP in the last 8760 hours. HbA1C: No results for input(s): HGBA1C in the last 72 hours. CBG: No results for input(s): GLUCAP in the last 168 hours. Lipid Profile: No results for input(s): CHOL, HDL, LDLCALC, TRIG, CHOLHDL, LDLDIRECT in the last 72 hours. Thyroid Function Tests: No results for input(s): TSH, T4TOTAL, FREET4, T3FREE, THYROIDAB in the last 72 hours. Anemia Panel: No results for input(s): VITAMINB12, FOLATE, FERRITIN, TIBC, IRON, RETICCTPCT in the last 72 hours. Urine analysis:    Component Value Date/Time   COLORURINE YELLOW 10/02/2015 1013   APPEARANCEUR TURBID* 10/02/2015 1013   LABSPEC 1.023 10/02/2015 1013   PHURINE 5.5 10/02/2015 1013   GLUCOSEU NEGATIVE 10/02/2015 1013   HGBUR LARGE*  10/02/2015 1013   BILIRUBINUR NEGATIVE 10/02/2015 1013   KETONESUR NEGATIVE 10/02/2015 1013   PROTEINUR 100* 10/02/2015 1013   UROBILINOGEN 4.0* 11/14/2014 1849   NITRITE NEGATIVE 10/02/2015 1013   LEUKOCYTESUR LARGE* 10/02/2015 1013   Sepsis Labs: @LABRCNTIP (procalcitonin:4,lacticidven:4) )No results found for this or any previous visit (from the past 240 hour(s)).   Radiological Exams on Admission: Dg Chest Portable 1 View  10/02/2015  CLINICAL DATA:  Fever and dry nonproductive cough. EXAM: PORTABLE CHEST 1 VIEW COMPARISON:  Chest x-ray dated 05/01/2015. FINDINGS: Cardiomediastinal silhouette is stable in size and configuration. Atherosclerotic changes again noted at the aortic arch. Median sternotomy wires appear intact and stable alignment, for presumed CABG. Mild scarring/fibrosis at the left lung base. Lungs otherwise clear. No evidence of pneumonia. No pleural effusion or pneumothorax seen. IMPRESSION: 1. No acute findings.  No evidence of pneumonia. 2. Aortic atherosclerosis. Electronically Signed   By: Bary Richard M.D.   On: 10/02/2015 09:59    EKG: (Independently reviewed) wide complex regular rhythm without definitive heat or interval-ventricular rate 101 bpm, suspect ectopic atrial tachycardia, prominent S waves in V3 and V4 consistent with LVH, prolonged QTC of 537 ms in setting of incomplete left bundle branch block is unchanged from previous EKG in February  Assessment/Plan Principal Problem:   Sepsis due to urinary tract infection  -Patient presented with altered mentation, tachycardia and fever and later developed hypotension that has responded to fluid challenge -Has improved hemodynamically but not yet stable although most recent readings demonstrate systolic blood pressure staying greater than 100 with MAP between 61 and 64 -Source appears to be urinary tract -Empiric Rocephin -Follow up on blood and urine cultures -Initial Procalcitonin markedly elevated at  75.94 -Continue IV fluids at  100 mL per hour and only decrease if develops signs of volume overload/heart failure -Because of recurrent hypotension in patient on chronic out be at low dose steroids we will give stress dose Solu-Cortef 50 g IV every 8 hours for at least 24 hours and consider tapering at that time -Continue to cycle lactic acid and total less than 2  Active Problems:   Coronary artery disease/Demand ischemia  -Mild bump in troponin likely related to recent hypotension and increased demand from sepsis -No complaints of chest pain -Cycle troponin for completeness of evaluation -Beta blocker on hold secondary to hypotension -Baby aspirin on hold secondary to chronic thrombocytopenia in association with sepsis coagulopathy    Chronic combined systolic and diastolic congestive heart failure  -Currently compensated and diuretics and carvedilol on hold secondary to hypotension    Rheumatoid arthritis  -Currently no symptoms -Methotrexate on Monday Wednesday Friday -Typically on prednisone 5 mg daily but stress dose steroids as above    S/P TAVR (transcatheter aortic valve replacement)    Thrombocytopenia  -Chronic and stable, greater than 100,000 -Okay to use subcutaneous heparin for DVT prophylaxis    BPH (benign prostatic hypertrophy)    Malnutrition of moderate degree (HCC)    Anemia    Hard of hearing      DVT prophylaxis: Subcutaneous heparin Code Status: Full code Family Communication: Son at bedside Disposition Plan: Anticipate discharge to previous home impairment once medically stable Consults called: None Admission status: Inpatient/stepdown    Jniya Madara L. ANP-BC [facility] Pager [phone]   If 7PM-7AM, please contact night-coverage [email or web address] Password [facility]  10/02/2015, 5:17 PM

## 2015-10-02 NOTE — ED Notes (Addendum)
Reported to Dr. Judd Lien that pt.s Bp is 93/55 and would like to change his Chest X-ray to Portable . Verbal order received to change chest x-ray

## 2015-10-02 NOTE — ED Notes (Signed)
Paramedic reports that pt. 's son reported that pt. Is having a fever and dry unproductive cough.  Pt. Is alert and oriented X4. He denies any pain, n/v/d.  Pt. Stated, "I don't feel good".  Fever 104.2 rectal. Skin is p/w/d

## 2015-10-02 NOTE — ED Notes (Signed)
Pt. And pt.'s son is aware of needing an urine specimen.  Pt. Given an urinal

## 2015-10-02 NOTE — Consult Note (Signed)
Name: Hunter Choi MRN: [id number] DOB: Sep 11, 1927    ADMISSION DATE:  10/02/2015 CONSULTATION DATE:  10/02/15  REFERRING MD :  Dr. Judd Lien   CHIEF COMPLAINT:  UTI  HISTORY OF PRESENT ILLNESS:  82 y/o M with PMH of , HOH (wears bilateral hearing aides), GERD, retinal artery embolism, CAD s/p CABG, HTN, severe aortic stenosis s/p TAVR, AAA (followed by Dr. Edilia Bo), iliac artery aneurysm, CVA, severe RA, BPH, and PAF who presented to [facility] on 7/7 with reports of fever.    At baseline, the patient remains independent of ADL's.  He has to have help with fine motor / gripping due to RA changes.  His son is his primary caregiver.  The patient's wife is currently hospitalized as well.  The son reports he noted his Dad to have several coughing episodes the evening prior to presentation.  He reports he "does that to throw up when he feels nauseated".  The patient reports intermittent burning with urination.  The son went to help him out of bed and noticed he felt hot to touch prompting evaluation.    Initial ER evaluation noted rectal temperature of 104.2 and shaking chills.  Labs - Na 131, K 4.0, Cl 97, sr cr 1.15 / BUN 28, troponin 0.22, lactic acid 3.30, troponin 0.22, WBC 10.7, Hgb 12.2, and platelets 112.  Urinalysis notable for UTI (TNTC WBC, many bacteria, large leukocytes, 6-30 squamous epithelial cells). He developed hypotension to mid 70's and was treated with 3L of NS with improvement in BP.  Lactic acid repeat cleared to 2.86.  IV abx administered in ER.    PCCM called for evaluation for sepsis.    PAST MEDICAL HISTORY :   has a past medical history of Hypertension; GERD (gastroesophageal reflux disease); Aortic stenosis, severe; Embolism involving retinal artery; BPH (benign prostatic hypertrophy); Iliac artery aneurysm [facility]); AAA (abdominal aortic aneurysm) (HCC); Diastolic heart failure (HCC); Coronary artery disease; CAD (coronary artery disease) of bypass graft; Hypothyroidism; Stroke [facility]);  S/P TAVR (transcatheter aortic valve replacement) (01/15/2013); PAF (paroxysmal atrial fibrillation) (HCC); and Blind left eye.  has past surgical history that includes Hemiarthroplasty hip (Left); Total knee arthroplasty (6599-3570); Biopsy prostate (2005); Transurethral resection of prostate (2012); Joint replacement; Harvest bone graft (1982); SKIN GRAFTS (1968); Colonoscopy; Bunionectomy with hammertoe reconstruction (Bilateral); Transcatheter aortic valve replacement, transapical (N/A, 01/15/2013); Intraoprative transesophageal echocardiogram (N/A, 01/15/2013); left and right heart catheterization with coronary angiogram (N/A, 12/20/2012); Carotid endarterectomy (Right, Jan. 2, 1997); Cardiac catheterization; Total hip arthroplasty (Left, 2007); Cataract extraction w/ intraocular lens implant (Right); Coronary artery bypass graft (01/1998); Cholecystectomy (N/A, 01/28/2015); and Cardiac catheterization (N/A, 05/07/2015).    Prior to Admission medications   Medication Sig Start Date End Date Taking? Authorizing Provider  acetaminophen (TYLENOL) 325 MG tablet Take 2 tablets (650 mg total) by mouth every 6 (six) hours as needed for mild pain, moderate pain, fever or headache. 02/03/15  Yes Elease Etienne, MD  amLODipine (NORVASC) 5 MG tablet Take 0.5 tablets (2.5 mg total) by mouth daily at 3 pm. Patient taking differently: Take 5 mg by mouth daily at 3 pm.  02/03/15  Yes Elease Etienne, MD  aspirin EC 81 MG EC tablet Take 1 tablet (81 mg total) by mouth daily. 01/21/13  Yes Donielle Margaretann Loveless, PA-C  carvedilol (COREG) 6.25 MG tablet Take 1 tablet (6.25 mg total) by mouth 2 (two) times daily with a meal. 05/11/15  Yes Zannie Cove, MD  folic acid (FOLVITE) 1 MG tablet Take  3 mg by mouth daily.   Yes Historical Provider, MD  furosemide (LASIX) 20 MG tablet Take 2 tablets (40 mg total) by mouth daily. Patient taking differently: Take 20 mg by mouth 2 (two) times daily.  05/11/15  Yes Zannie Cove, MD    levothyroxine (SYNTHROID, LEVOTHROID) 50 MCG tablet Take 50 mcg by mouth daily. 11/06/14  Yes Historical Provider, MD  MELATONIN PO Take 1 tablet by mouth daily.   Yes Historical Provider, MD  methotrexate 2.5 MG tablet Take 25 mg by mouth every Friday.    Yes Historical Provider, MD  nitroGLYCERIN (NITROSTAT) 0.4 MG SL tablet Place 1 tablet (0.4 mg total) under the tongue every 5 (five) minutes as needed for chest pain. 06/19/15  Yes Lyn Records, MD  omeprazole (PRILOSEC) 20 MG capsule Take 20 mg by mouth daily.   Yes Historical Provider, MD  predniSONE (DELTASONE) 5 MG tablet Take 5 mg by mouth daily. 04/07/15  Yes Historical Provider, MD  simvastatin (ZOCOR) 20 MG tablet Take 20 mg by mouth every evening. 12/20/14  Yes Historical Provider, MD   Allergies  Allergen Reactions  . Ciprofloxacin Other (See Comments)    REACTION: mild delirium    FAMILY HISTORY:  family history includes Heart attack in his brother, father, and mother; Heart disease in his brother, father, and mother; Hyperlipidemia in his brother, father, and mother; Hypertension in his brother, father, mother, and son.   SOCIAL HISTORY:  reports that he quit smoking about 57 years ago. His smoking use included Cigarettes. He has a 23.5 pack-year smoking history. He has quit using smokeless tobacco. His smokeless tobacco use included Chew. He reports that he does not drink alcohol or use illicit drugs.  REVIEW OF SYSTEMS:  POSITIVES IN BOLD Constitutional: Negative for fever, chills, weight loss, malaise/fatigue and diaphoresis.  HENT: Negative for hearing loss, ear pain, nosebleeds, congestion, sore throat, neck pain, tinnitus and ear discharge.   Eyes: Negative for blurred vision, double vision, photophobia, pain, discharge and redness.  Respiratory: Negative for cough, hemoptysis, sputum production, shortness of breath, wheezing and stridor.   Cardiovascular: Negative for chest pain, palpitations, orthopnea, claudication,  leg swelling and PND.  Gastrointestinal: Negative for heartburn, nausea, vomiting, abdominal pain, diarrhea, constipation, blood in stool and melena.  Genitourinary: Negative for dysuria, urgency, frequency, hematuria and flank pain.  Musculoskeletal: Negative for myalgias, back pain, joint pain and falls.  Skin: Negative for itching and rash.  Neurological: Negative for dizziness, tingling, tremors, sensory change, speech change, focal weakness, seizures, loss of consciousness, weakness and headaches.  Endo/Heme/Allergies: Negative for environmental allergies and polydipsia. Does not bruise/bleed easily.  SUBJECTIVE:  Pt reports feeling better.  Denies chest pain, abdominal pain, nausea.    VITAL SIGNS: Temp:  [104.2 F (40.1 C)] 104.2 F (40.1 C) (07/07 0734) Pulse Rate:  [73-116] 74 (07/07 1430) Resp:  [12-25] 12 (07/07 1430) BP: (76-123)/(29-67) 91/36 mmHg (07/07 1430) SpO2:  [91 %-100 %] 93 % (07/07 1430) Weight:  [136 lb (61.689 kg)-138 lb (62.596 kg)] 138 lb (62.596 kg) (07/07 0942)  PHYSICAL EXAMINATION: General:  Frail elderly male in NAD, asleep upon entering the room. Comfortably laying flat on the bed and not in distress. Neuro:  Awakens to touch, oriented, speech clear, MAE  HEENT:  MM pink/dry, no jvd Cardiovascular:  s1s2 rrr, no m/r/g Lungs:  Even/non-labored, lungs bilaterally clear  Abdomen:  Obese/soft, bsx4 active  Musculoskeletal:  Swan neck deformities of hands  Skin:  Warm/dry, no edema  Recent Labs Lab 10/02/15 0800  NA 131*  K 4.0  CL 97*  CO2 24  BUN 28*  CREATININE 1.15  GLUCOSE 86    Recent Labs Lab 10/02/15 0800  HGB 12.2*  HCT 37.9*  WBC 10.7*  PLT 112*   Dg Chest Portable 1 View  10/02/2015  CLINICAL DATA:  Fever and dry nonproductive cough. EXAM: PORTABLE CHEST 1 VIEW COMPARISON:  Chest x-ray dated 05/01/2015. FINDINGS: Cardiomediastinal silhouette is stable in size and configuration. Atherosclerotic changes again noted at the  aortic arch. Median sternotomy wires appear intact and stable alignment, for presumed CABG. Mild scarring/fibrosis at the left lung base. Lungs otherwise clear. No evidence of pneumonia. No pleural effusion or pneumothorax seen. IMPRESSION: 1. No acute findings.  No evidence of pneumonia. 2. Aortic atherosclerosis. Electronically Signed   By: Bary Richard M.D.   On: 10/02/2015 09:59   SIGNIFICANT EVENTS  7/07  Admit with UTI   STUDIES:    CULTURES: UC 7/7 >>  BCx2 7/7 >>   ANTIBIOTICS:  Vanco 7/7 >>  Zosyn 7/7 >>   ASSESSMENT / PLAN:  Discussion:  82 y/o M with admitted on 7/7 with fever, chills and nausea.  Work up concerning for urosepsis with hypotension and elevated lactic acid.  BP improved with IVF hydration as well as clearing of lactic acid.     UTI - urinalysis with TNTC WBC on admit, urine culture pending Hypotension / Severe Sepsis - secondary to #1, resolving with IVF's Lactic Acidosis - in setting of hypotension, clearing with IVF's Fever  Hx BPH   Plan: Ok for admit to SDU  Continue IV ABX as above  Pt has received 3L already. Cautious hydration at this point 2/2  EF 20-25%.  Monitor respiratory status closely with fluid resuscitation and hx of dCHF Trend Cultures as above, narrow abx as appropriate  PRN tylenol for temp > 101.5 Monitor CBC  Consider outpatient urology evaluation given UTI, Hx BPH   Hx HTN  Systolic CHF  EF 62-95%  PAF  CAD s/p CABG Severe AS s/p TAVR AAA  Iliac Artery Aneurysm Hx CVA   Plan: SDU monitoring  Recommend hold home antihypertensive regimen 7/7 with reassessment for restart on 7/8 Cautious fluid hydration.    Canary Brim, NP-C [facility] Pgr: [phone] or if no answer [phone] 10/02/2015, 3:10 PM    ATTENDING NOTE / ATTESTATION NOTE :   I have discussed the case with the resident/APP Canary Brim  I agree with the resident/APP's  history, physical examination, assessment, and plans.     I have edited the above note and modified it according to our agreed history, physical examination, assessment and plan.   Briefly, pt being admitted for severe sepsis 2/2 UTI. Was hypotensive initially but BP better after 3L NS. His BP usually runs low. Pt is comfortably laying in bed, not in distress, no subjective complaints. CTA. (-) edema. Pt known to have CAD, AAA, S/P TAVR for AS, CHF with EF 20-25%.   Cont Abx.  Cont with cautious IV hydration for fear of congestion as pt has gotten 3L already.  Panculture. Check PCT, lactate.  Suggest SDU for now.  D/w ED re: disposition.  PCCM will sign off for now. Call back if with any questions.    I have spent 30 minutes of critical care time with this patient today.  Family :Family updated at length today.  I spoke and updated son at bedside.  Pollie Meyer, MD 10/02/2015, 3:45 PM [facility] Pager [phone] After 3 pm or if no answer, call [phone]

## 2015-10-03 LAB — PROTIME-INR
INR: 1.39 (ref 0.00–1.49)
PROTHROMBIN TIME: 17.1 s — AB (ref 11.6–15.2)

## 2015-10-03 LAB — CBC
HCT: 32.3 % — ABNORMAL LOW (ref 39.0–52.0)
HEMOGLOBIN: 10.2 g/dL — AB (ref 13.0–17.0)
MCH: 29.7 pg (ref 26.0–34.0)
MCHC: 31.6 g/dL (ref 30.0–36.0)
MCV: 93.9 fL (ref 78.0–100.0)
PLATELETS: 84 10*3/uL — AB (ref 150–400)
RBC: 3.44 MIL/uL — AB (ref 4.22–5.81)
RDW: 17 % — ABNORMAL HIGH (ref 11.5–15.5)
WBC: 13 10*3/uL — AB (ref 4.0–10.5)

## 2015-10-03 LAB — COMPREHENSIVE METABOLIC PANEL
ALT: 35 U/L (ref 17–63)
ANION GAP: 8 (ref 5–15)
AST: 56 U/L — ABNORMAL HIGH (ref 15–41)
Albumin: 2.5 g/dL — ABNORMAL LOW (ref 3.5–5.0)
Alkaline Phosphatase: 70 U/L (ref 38–126)
BUN: 22 mg/dL — ABNORMAL HIGH (ref 6–20)
CHLORIDE: 107 mmol/L (ref 101–111)
CO2: 19 mmol/L — ABNORMAL LOW (ref 22–32)
CREATININE: 0.96 mg/dL (ref 0.61–1.24)
Calcium: 7.6 mg/dL — ABNORMAL LOW (ref 8.9–10.3)
Glucose, Bld: 105 mg/dL — ABNORMAL HIGH (ref 65–99)
Potassium: 4.1 mmol/L (ref 3.5–5.1)
SODIUM: 134 mmol/L — AB (ref 135–145)
Total Bilirubin: 0.5 mg/dL (ref 0.3–1.2)
Total Protein: 5.5 g/dL — ABNORMAL LOW (ref 6.5–8.1)

## 2015-10-03 LAB — TROPONIN I
TROPONIN I: 0.81 ng/mL — AB (ref <0.03)
TROPONIN I: 2.36 ng/mL — AB (ref <0.03)

## 2015-10-03 LAB — APTT: APTT: 47 s — AB (ref 24–37)

## 2015-10-03 MED ORDER — PREDNISONE 5 MG PO TABS
5.0000 mg | ORAL_TABLET | Freq: Every day | ORAL | Status: DC
  Administered 2015-10-04 – 2015-10-06 (×3): 5 mg via ORAL
  Filled 2015-10-03 (×3): qty 1

## 2015-10-03 MED ORDER — MAGNESIUM SULFATE 2 GM/50ML IV SOLN
2.0000 g | Freq: Once | INTRAVENOUS | Status: AC
  Administered 2015-10-03: 2 g via INTRAVENOUS
  Filled 2015-10-03: qty 50

## 2015-10-03 MED ORDER — FOLIC ACID 1 MG PO TABS
3.0000 mg | ORAL_TABLET | Freq: Every day | ORAL | Status: DC
  Administered 2015-10-03 – 2015-10-06 (×4): 3 mg via ORAL
  Filled 2015-10-03 (×5): qty 3

## 2015-10-03 MED ORDER — CARVEDILOL 6.25 MG PO TABS
6.2500 mg | ORAL_TABLET | Freq: Two times a day (BID) | ORAL | Status: DC
  Administered 2015-10-03 – 2015-10-06 (×7): 6.25 mg via ORAL
  Filled 2015-10-03 (×7): qty 1

## 2015-10-03 MED ORDER — ASPIRIN EC 81 MG PO TBEC
81.0000 mg | DELAYED_RELEASE_TABLET | Freq: Every day | ORAL | Status: DC
  Administered 2015-10-03 – 2015-10-06 (×4): 81 mg via ORAL
  Filled 2015-10-03 (×4): qty 1

## 2015-10-03 NOTE — Progress Notes (Signed)
[place] TEAM 1 - Stepdown/ICU TEAM  Hunter Choi  KGM:[id number] DOB: 05-Oct-1927 DOA: 10/02/2015 PCP: Clelia Schaumann, MD    Brief Narrative:  82 y.o. male with history of ischemic chronic combined systolic and diastolic heart failure with an EF of 20-25%, severe aortic stenosis status post TAVR, HTN, RA on low-dose chronic prednisone and methotrexate, chronic thrombocytopenia, chronic anemia, BPH, malnutrition and significant hearing loss. Patient's son noticed that he was confused and felt warm. When he checked an oral temperature it was 103F. Patient had no other complaints.  In the emergency room a urinalysis was suggestive of a UTI.  Subjective: The patient is awake alert and oriented.  He states he is feeling much better overall.  He denies chest pain shortness breath fevers chills nausea or vomiting.  He tells me he is quite hungry.  Assessment & Plan:  Sepsis due to E coli urinary tract infection w/ bacteremia (2 of 2 blood cx) -stress dose steroids - empiric abx until sensitivities available - clinically stabilizing  Coronary artery disease / Demand ischemia  -No chest pain - troponin has peaked at 2.8 - hemodynamically stable  Chronic combined systolic and diastolic congestive heart failure  -diuretics on hold - resume Coreg  Hypomagnesemia -Replace and follow up in a.m.  Rheumatoid arthritis  -Methotrexate on Monday Wednesday Friday -chronic prednisone 5 mg daily   S/P TAVR  -No evidence of valve dysfunction at this time  Thrombocytopenia  -Chronic and stable  BPH  Malnutrition of moderate degree  Normocytic Anemia Stable  Hard of hearing  DVT prophylaxis: SQ heparin Code Status: FULL CODE Family Communication: no family present at time of exam  Disposition Plan:   Consultants:  PCCM  Procedures: none  Antimicrobials:  Rocephin 7/8> Zosyn 7/7 Vanc 7/7  Objective: Blood pressure 135/86, pulse 71, temperature 97.2 F (36.2 C),  temperature source Axillary, resp. rate 19, height 5\' 7"  (1.702 m), weight 67.9 kg (149 lb 11.1 oz), SpO2 97 %.  Intake/Output Summary (Last 24 hours) at 10/03/15 1054 Last data filed at 10/03/15 0700  Gross per 24 hour  Intake 1878.83 ml  Output    300 ml  Net 1578.83 ml   Filed Weights   10/02/15 0942 10/02/15 1725 10/03/15 0500  Weight: 62.596 kg (138 lb) 67.495 kg (148 lb 12.8 oz) 67.9 kg (149 lb 11.1 oz)    Examination: General: No acute respiratory distress Lungs: Clear to auscultation bilaterally without wheezes or crackles Cardiovascular: Regular rate and rhythm without murmur gallop or rub normal S1 and S2 Abdomen: Nontender, nondistended, soft, bowel sounds positive, no rebound, no ascites, no appreciable mass Extremities: No significant cyanosis, clubbing, or edema bilateral lower extremities  CBC:  Recent Labs Lab 10/02/15 0800 10/03/15 0546  WBC 10.7* 13.0*  NEUTROABS 9.5*  --   HGB 12.2* 10.2*  HCT 37.9* 32.3*  MCV 94.8 93.9  PLT 112* 84*   Basic Metabolic Panel:  Recent Labs Lab 10/02/15 0800 10/02/15 1828 10/03/15 0546  NA 131*  --  134*  K 4.0  --  4.1  CL 97*  --  107  CO2 24  --  19*  GLUCOSE 86  --  105*  BUN 28*  --  22*  CREATININE 1.15  --  0.96  CALCIUM 8.8*  --  7.6*  MG  --  1.6*  --   PHOS  --  2.9  --    GFR: Estimated Creatinine Clearance: 51.6 mL/min (by C-G formula based on  Cr of 0.96).  Liver Function Tests:  Recent Labs Lab 10/02/15 0800 10/03/15 0546  AST 31 56*  ALT 17 35  ALKPHOS 69 70  BILITOT 0.6 0.5  PROT 7.0 5.5*  ALBUMIN 3.5 2.5*    Coagulation Profile:  Recent Labs Lab 10/02/15 1612 10/03/15 0546  INR 1.58* 1.39    Cardiac Enzymes:  Recent Labs Lab 10/02/15 1828 10/02/15 2333 10/03/15 0546  TROPONINI 2.80* 2.36* 0.81*    HbA1C: HGB A1C MFR BLD  Date/Time Value Ref Range Status  05/01/2015 05:17 PM 6.0* 4.8 - 5.6 % Final    Comment:    (NOTE)         Pre-diabetes: 5.7 - 6.4          Diabetes: >6.4         Glycemic control for adults with diabetes: <7.0   01/11/2013 01:56 PM 6.0* <5.7 % Final    Comment:    (NOTE)                                                                       According to the ADA Clinical Practice Recommendations for 2011, when HbA1c is used as a screening test:  >=6.5%   Diagnostic of Diabetes Mellitus           (if abnormal result is confirmed) 5.7-6.4%   Increased risk of developing Diabetes Mellitus References:Diagnosis and Classification of Diabetes Mellitus,Diabetes Care,2011,34(Suppl 1):S62-S69 and Standards of Medical Care in         Diabetes - 2011,Diabetes Care,2011,34 (Suppl 1):S11-S61.     Recent Results (from the past 240 hour(s))  Culture, blood (Routine x 2)     Status: Abnormal (Preliminary result)   Collection Time: 10/02/15  7:50 AM  Result Value Ref Range Status   Specimen Description BLOOD RIGHT ANTECUBITAL  Final   Special Requests BOTTLES DRAWN AEROBIC AND ANAEROBIC  10CC  Final   Culture  Setup Time   Final    GRAM NEGATIVE RODS IN BOTH AEROBIC AND ANAEROBIC BOTTLES CRITICAL RESULT CALLED TO, READ BACK BY AND VERIFIED WITH: L. POWELL,PHARM AT 2234 ON [id number] BY S. YARBROUGH    Culture ESCHERICHIA COLI (A)  Final   Report Status PENDING  Incomplete  Blood Culture ID Panel (Reflexed)     Status: Abnormal   Collection Time: 10/02/15  7:50 AM  Result Value Ref Range Status   Enterococcus species NOT DETECTED NOT DETECTED Final   Vancomycin resistance NOT DETECTED NOT DETECTED Final   Listeria monocytogenes NOT DETECTED NOT DETECTED Final   Staphylococcus species NOT DETECTED NOT DETECTED Final   Staphylococcus aureus NOT DETECTED NOT DETECTED Final   Methicillin resistance NOT DETECTED NOT DETECTED Final   Streptococcus species NOT DETECTED NOT DETECTED Final   Streptococcus agalactiae NOT DETECTED NOT DETECTED Final   Streptococcus pneumoniae NOT DETECTED NOT DETECTED Final   Streptococcus pyogenes NOT DETECTED  NOT DETECTED Final   Acinetobacter baumannii NOT DETECTED NOT DETECTED Final   Enterobacteriaceae species DETECTED (A) NOT DETECTED Final    Comment: CRITICAL RESULT CALLED TO, READ BACK BY AND VERIFIED WITH: L. POWELL,PHARM AT 2234 ON [id number] BY S. YARBROUGH    Enterobacter cloacae complex NOT DETECTED NOT DETECTED Final   Escherichia coli  DETECTED (A) NOT DETECTED Final    Comment: CRITICAL RESULT CALLED TO, READ BACK BY AND VERIFIED WITH: L. POWELL,PHARM AT 2234 ON [id number] BY S. YARBROUGH    Klebsiella oxytoca NOT DETECTED NOT DETECTED Final   Klebsiella pneumoniae NOT DETECTED NOT DETECTED Final   Proteus species NOT DETECTED NOT DETECTED Final   Serratia marcescens NOT DETECTED NOT DETECTED Final   Carbapenem resistance NOT DETECTED NOT DETECTED Final   Haemophilus influenzae NOT DETECTED NOT DETECTED Final   Neisseria meningitidis NOT DETECTED NOT DETECTED Final   Pseudomonas aeruginosa NOT DETECTED NOT DETECTED Final   Candida albicans NOT DETECTED NOT DETECTED Final   Candida glabrata NOT DETECTED NOT DETECTED Final   Candida krusei NOT DETECTED NOT DETECTED Final   Candida parapsilosis NOT DETECTED NOT DETECTED Final   Candida tropicalis NOT DETECTED NOT DETECTED Final  Culture, blood (Routine x 2)     Status: None (Preliminary result)   Collection Time: 10/02/15  8:00 AM  Result Value Ref Range Status   Specimen Description BLOOD RIGHT HAND  Final   Special Requests BOTTLES DRAWN AEROBIC AND ANAEROBIC  5CC  Final   Culture  Setup Time   Final    GRAM NEGATIVE RODS IN BOTH AEROBIC AND ANAEROBIC BOTTLES CRITICAL RESULT CALLED TO, READ BACK BY AND VERIFIED WITH: L POWELL, PHARM AT 2234 ON [id number] BY Lucienne Capers    Culture   Final    GRAM NEGATIVE RODS CULTURE REINCUBATED FOR BETTER GROWTH    Report Status PENDING  Incomplete  MRSA PCR Screening     Status: None   Collection Time: 10/02/15  6:12 PM  Result Value Ref Range Status   MRSA by PCR NEGATIVE NEGATIVE Final     Comment:        The GeneXpert MRSA Assay (FDA approved for NASAL specimens only), is one component of a comprehensive MRSA colonization surveillance program. It is not intended to diagnose MRSA infection nor to guide or monitor treatment for MRSA infections.      Scheduled Meds: . cefTRIAXone (ROCEPHIN)  IV  2 g Intravenous Q24H  . heparin  5,000 Units Subcutaneous Q8H  . hydrocortisone sod succinate (SOLU-CORTEF) inj  50 mg Intravenous Q8H  . levothyroxine  50 mcg Oral QAC breakfast  . sodium chloride flush  3 mL Intravenous Q12H   Continuous Infusions: . sodium chloride 100 mL/hr at 10/02/15 1742     LOS: 1 day   Time spent: 35 minutes   Lonia Blood, MD [facility] Office  [phone] Pager - Text Page per Loretha Stapler as per below:  On-Call/Text Page:      [email or web address]      password [email or web address]  If 7PM-7AM, please contact night-coverage [email or web address] Password [facility] 10/03/2015, 10:54 AM

## 2015-10-04 LAB — COMPREHENSIVE METABOLIC PANEL
ALK PHOS: 59 U/L (ref 38–126)
ALT: 28 U/L (ref 17–63)
ANION GAP: 5 (ref 5–15)
AST: 42 U/L — ABNORMAL HIGH (ref 15–41)
Albumin: 2.5 g/dL — ABNORMAL LOW (ref 3.5–5.0)
BILIRUBIN TOTAL: 0.4 mg/dL (ref 0.3–1.2)
BUN: 16 mg/dL (ref 6–20)
CALCIUM: 7.6 mg/dL — AB (ref 8.9–10.3)
CO2: 23 mmol/L (ref 22–32)
Chloride: 102 mmol/L (ref 101–111)
Creatinine, Ser: 0.79 mg/dL (ref 0.61–1.24)
GFR calc non Af Amer: >60 mL/min (ref >60)
GLUCOSE: 127 mg/dL — AB (ref 65–99)
Potassium: 3.5 mmol/L (ref 3.5–5.1)
Sodium: 130 mmol/L — ABNORMAL LOW (ref 135–145)
TOTAL PROTEIN: 5 g/dL — AB (ref 6.5–8.1)

## 2015-10-04 LAB — CULTURE, BLOOD (ROUTINE X 2)

## 2015-10-04 LAB — URINE CULTURE

## 2015-10-04 LAB — CBC
HCT: 29.2 % — ABNORMAL LOW (ref 39.0–52.0)
HEMOGLOBIN: 9.3 g/dL — AB (ref 13.0–17.0)
MCH: 30.1 pg (ref 26.0–34.0)
MCHC: 31.8 g/dL (ref 30.0–36.0)
MCV: 94.5 fL (ref 78.0–100.0)
Platelets: 100 10*3/uL — ABNORMAL LOW (ref 150–400)
RBC: 3.09 MIL/uL — AB (ref 4.22–5.81)
RDW: 16.9 % — ABNORMAL HIGH (ref 11.5–15.5)
WBC: 11.8 10*3/uL — ABNORMAL HIGH (ref 4.0–10.5)

## 2015-10-04 MED ORDER — FUROSEMIDE 40 MG PO TABS
40.0000 mg | ORAL_TABLET | Freq: Every day | ORAL | Status: DC
  Administered 2015-10-04 – 2015-10-06 (×3): 40 mg via ORAL
  Filled 2015-10-04 (×3): qty 1

## 2015-10-04 NOTE — Progress Notes (Signed)
Attempted to call report. RN on 200 to call me back.    Hamna Asa RN

## 2015-10-04 NOTE — Progress Notes (Signed)
10/04/2015 patient had a temp 100.1 orally at 1300 and it was rechecked rectally temp was 102.0. Patient c/o H/A and he  was given Tylennol 650mg  orally. Patient also had a flush look on face. Dr was made aware. [facility] RN.

## 2015-10-04 NOTE — Progress Notes (Signed)
[place] TEAM 1 - Stepdown/ICU TEAM  ARMONTE GIRAUD  SJG:[id number] DOB: 1927-09-01 DOA: 10/02/2015 PCP: Clelia Schaumann, MD    Brief Narrative:  82 y.o. male with history of ischemic chronic combined systolic and diastolic heart failure with an EF of 20-25%, severe aortic stenosis status post TAVR, HTN, RA on low-dose chronic prednisone and methotrexate, chronic thrombocytopenia, chronic anemia, BPH, malnutrition and significant hearing loss. Patient's son noticed that he was confused and felt warm. When he checked an oral temperature it was 103F. Patient had no other complaints.  In the emergency room a urinalysis was suggestive of a UTI.  Subjective: The patient is alert and conversant.  He states he feels much better in general.  He denies chest pain shortness breath fevers chills nausea or vomiting.  He didn't tolerate his breakfast this morning though he does not like pancakes.  Assessment & Plan:  Sepsis due to E coli urinary tract infection w/ bacteremia (2 of 2 blood cx) -stress dose steroids stopped - back on home pred dose - confirmed sensitive to Rocephin - clinically stabilizing - plan 14 days of abx tx given confirmed bacteremia   Coronary artery disease / Demand ischemia  -No chest pain - troponin peaked at 2.8 - hemodynamically stable  Chronic combined systolic and diastolic congestive heart failure  -on Coreg - resume diuretic today as weight is climbing - not on ACEi/ARB as outpt due to episode of AKI and hypotension - consider initiating this hospital stay if renal fxn and BP remain stable after diuretic resumed  Filed Weights   10/02/15 1725 10/03/15 0500 10/04/15 0544  Weight: 67.495 kg (148 lb 12.8 oz) 67.9 kg (149 lb 11.1 oz) 69.6 kg (153 lb 7 oz)    Hypomagnesemia -Replaced - follow up in a.m.  Rheumatoid arthritis  -Methotrexate on Monday Wednesday Friday -chronic prednisone 5 mg daily   S/P TAVR  -No evidence of valve dysfunction at this  time  Thrombocytopenia  -Chronic and stable  Recent Labs Lab 10/02/15 0800 10/03/15 0546 10/04/15 0216  PLT 112* 84* 100*    BPH  Malnutrition of moderate degree  Normocytic Anemia Stable  Hard of hearing  DVT prophylaxis: SQ heparin Code Status: FULL CODE Family Communication: no family present at time of exam  Disposition Plan: Transfer to telemetry floor - PT/OT - follow renal function and volume status  Consultants:  PCCM  Procedures: none  Antimicrobials:  Rocephin 7/8> Zosyn 7/7 Vanc 7/7  Objective: Blood pressure 160/62, pulse 57, temperature 98.5 F (36.9 C), temperature source Oral, resp. rate 16, height 5\' 7"  (1.702 m), weight 69.6 kg (153 lb 7 oz), SpO2 96 %.  Intake/Output Summary (Last 24 hours) at 10/04/15 1130 Last data filed at 10/04/15 6546  Gross per 24 hour  Intake 1433.33 ml  Output   1525 ml  Net -91.67 ml   Filed Weights   10/02/15 1725 10/03/15 0500 10/04/15 0544  Weight: 67.495 kg (148 lb 12.8 oz) 67.9 kg (149 lb 11.1 oz) 69.6 kg (153 lb 7 oz)    Examination: General: No acute respiratory distress - alert but hard of hearing Lungs: Clear to auscultation bilaterally - no wheezes or crackles Cardiovascular: Regular rate and rhythm without murmur gallop or rub  Abdomen: Nontender, nondistended, soft, bowel sounds positive, no rebound, no ascites Extremities: No significant cyanosis, clubbing, or edema bilateral lower extremities  CBC:  Recent Labs Lab 10/02/15 0800 10/03/15 0546 10/04/15 0216  WBC 10.7* 13.0* 11.8*  NEUTROABS 9.5*  --   --  HGB 12.2* 10.2* 9.3*  HCT 37.9* 32.3* 29.2*  MCV 94.8 93.9 94.5  PLT 112* 84* 100*   Basic Metabolic Panel:  Recent Labs Lab 10/02/15 0800 10/02/15 1828 10/03/15 0546 10/04/15 0216  NA 131*  --  134* 130*  K 4.0  --  4.1 3.5  CL 97*  --  107 102  CO2 24  --  19* 23  GLUCOSE 86  --  105* 127*  BUN 28*  --  22* 16  CREATININE 1.15  --  0.96 0.79  CALCIUM 8.8*  --  7.6*  7.6*  MG  --  1.6*  --   --   PHOS  --  2.9  --   --    GFR: Estimated Creatinine Clearance: 62 mL/min (by C-G formula based on Cr of 0.79).  Liver Function Tests:  Recent Labs Lab 10/02/15 0800 10/03/15 0546 10/04/15 0216  AST 31 56* 42*  ALT 17 35 28  ALKPHOS 69 70 59  BILITOT 0.6 0.5 0.4  PROT 7.0 5.5* 5.0*  ALBUMIN 3.5 2.5* 2.5*    Coagulation Profile:  Recent Labs Lab 10/02/15 1612 10/03/15 0546  INR 1.58* 1.39    Cardiac Enzymes:  Recent Labs Lab 10/02/15 1828 10/02/15 2333 10/03/15 0546  TROPONINI 2.80* 2.36* 0.81*    HbA1C: HGB A1C MFR BLD  Date/Time Value Ref Range Status  05/01/2015 05:17 PM 6.0* 4.8 - 5.6 % Final    Comment:    (NOTE)         Pre-diabetes: 5.7 - 6.4         Diabetes: >6.4         Glycemic control for adults with diabetes: <7.0   01/11/2013 01:56 PM 6.0* <5.7 % Final    Comment:    (NOTE)                                                                       According to the ADA Clinical Practice Recommendations for 2011, when HbA1c is used as a screening test:  >=6.5%   Diagnostic of Diabetes Mellitus           (if abnormal result is confirmed) 5.7-6.4%   Increased risk of developing Diabetes Mellitus References:Diagnosis and Classification of Diabetes Mellitus,Diabetes Care,2011,34(Suppl 1):S62-S69 and Standards of Medical Care in         Diabetes - 2011,Diabetes Care,2011,34 (Suppl 1):S11-S61.     Recent Results (from the past 240 hour(s))  Culture, blood (Routine x 2)     Status: Abnormal   Collection Time: 10/02/15  7:50 AM  Result Value Ref Range Status   Specimen Description BLOOD RIGHT ANTECUBITAL  Final   Special Requests BOTTLES DRAWN AEROBIC AND ANAEROBIC  10CC  Final   Culture  Setup Time   Final    GRAM NEGATIVE RODS IN BOTH AEROBIC AND ANAEROBIC BOTTLES CRITICAL RESULT CALLED TO, READ BACK BY AND VERIFIED WITH: L. POWELL,PHARM AT 2234 ON [id number] BY S. YARBROUGH    Culture ESCHERICHIA COLI (A)  Final    Report Status 10/04/2015 FINAL  Final   Organism ID, Bacteria ESCHERICHIA COLI  Final      Susceptibility   Escherichia coli - MIC*    AMPICILLIN >=32 RESISTANT Resistant  CEFAZOLIN >=64 RESISTANT Resistant     CEFEPIME <=1 SENSITIVE Sensitive     CEFTAZIDIME <=1 SENSITIVE Sensitive     CEFTRIAXONE <=1 SENSITIVE Sensitive     CIPROFLOXACIN <=0.25 SENSITIVE Sensitive     GENTAMICIN <=1 SENSITIVE Sensitive     IMIPENEM <=0.25 SENSITIVE Sensitive     TRIMETH/SULFA <=20 SENSITIVE Sensitive     AMPICILLIN/SULBACTAM >=32 RESISTANT Resistant     PIP/TAZO 64 INTERMEDIATE Intermediate     * ESCHERICHIA COLI  Blood Culture ID Panel (Reflexed)     Status: Abnormal   Collection Time: 10/02/15  7:50 AM  Result Value Ref Range Status   Enterococcus species NOT DETECTED NOT DETECTED Final   Vancomycin resistance NOT DETECTED NOT DETECTED Final   Listeria monocytogenes NOT DETECTED NOT DETECTED Final   Staphylococcus species NOT DETECTED NOT DETECTED Final   Staphylococcus aureus NOT DETECTED NOT DETECTED Final   Methicillin resistance NOT DETECTED NOT DETECTED Final   Streptococcus species NOT DETECTED NOT DETECTED Final   Streptococcus agalactiae NOT DETECTED NOT DETECTED Final   Streptococcus pneumoniae NOT DETECTED NOT DETECTED Final   Streptococcus pyogenes NOT DETECTED NOT DETECTED Final   Acinetobacter baumannii NOT DETECTED NOT DETECTED Final   Enterobacteriaceae species DETECTED (A) NOT DETECTED Final    Comment: CRITICAL RESULT CALLED TO, READ BACK BY AND VERIFIED WITH: L. POWELL,PHARM AT 2234 ON [id number] BY S. YARBROUGH    Enterobacter cloacae complex NOT DETECTED NOT DETECTED Final   Escherichia coli DETECTED (A) NOT DETECTED Final    Comment: CRITICAL RESULT CALLED TO, READ BACK BY AND VERIFIED WITH: L. POWELL,PHARM AT 2234 ON [id number] BY S. YARBROUGH    Klebsiella oxytoca NOT DETECTED NOT DETECTED Final   Klebsiella pneumoniae NOT DETECTED NOT DETECTED Final   Proteus species  NOT DETECTED NOT DETECTED Final   Serratia marcescens NOT DETECTED NOT DETECTED Final   Carbapenem resistance NOT DETECTED NOT DETECTED Final   Haemophilus influenzae NOT DETECTED NOT DETECTED Final   Neisseria meningitidis NOT DETECTED NOT DETECTED Final   Pseudomonas aeruginosa NOT DETECTED NOT DETECTED Final   Candida albicans NOT DETECTED NOT DETECTED Final   Candida glabrata NOT DETECTED NOT DETECTED Final   Candida krusei NOT DETECTED NOT DETECTED Final   Candida parapsilosis NOT DETECTED NOT DETECTED Final   Candida tropicalis NOT DETECTED NOT DETECTED Final  Culture, blood (Routine x 2)     Status: Abnormal   Collection Time: 10/02/15  8:00 AM  Result Value Ref Range Status   Specimen Description BLOOD RIGHT HAND  Final   Special Requests BOTTLES DRAWN AEROBIC AND ANAEROBIC  5CC  Final   Culture  Setup Time   Final    GRAM NEGATIVE RODS IN BOTH AEROBIC AND ANAEROBIC BOTTLES CRITICAL RESULT CALLED TO, READ BACK BY AND VERIFIED WITH: L POWELL, PHARM AT 2234 ON [id number] BY S. YARBROUGH    Culture (A)  Final    ESCHERICHIA COLI SUSCEPTIBILITIES PERFORMED ON PREVIOUS CULTURE WITHIN THE LAST 5 DAYS.    Report Status 10/04/2015 FINAL  Final  Urine culture     Status: Abnormal   Collection Time: 10/02/15 10:13 AM  Result Value Ref Range Status   Specimen Description URINE, CATHETERIZED  Final   Special Requests NONE  Final   Culture >=100,000 COLONIES/mL ESCHERICHIA COLI (A)  Final   Report Status 10/04/2015 FINAL  Final   Organism ID, Bacteria ESCHERICHIA COLI (A)  Final      Susceptibility   Escherichia coli - MIC*  AMPICILLIN >=32 RESISTANT Resistant     CEFAZOLIN >=64 RESISTANT Resistant     CEFTRIAXONE <=1 SENSITIVE Sensitive     CIPROFLOXACIN <=0.25 SENSITIVE Sensitive     GENTAMICIN <=1 SENSITIVE Sensitive     IMIPENEM <=0.25 SENSITIVE Sensitive     NITROFURANTOIN <=16 SENSITIVE Sensitive     TRIMETH/SULFA <=20 SENSITIVE Sensitive     AMPICILLIN/SULBACTAM >=32  RESISTANT Resistant     PIP/TAZO >=128 RESISTANT Resistant     * >=100,000 COLONIES/mL ESCHERICHIA COLI  MRSA PCR Screening     Status: None   Collection Time: 10/02/15  6:12 PM  Result Value Ref Range Status   MRSA by PCR NEGATIVE NEGATIVE Final    Comment:        The GeneXpert MRSA Assay (FDA approved for NASAL specimens only), is one component of a comprehensive MRSA colonization surveillance program. It is not intended to diagnose MRSA infection nor to guide or monitor treatment for MRSA infections.      Scheduled Meds: . aspirin EC  81 mg Oral Daily  . carvedilol  6.25 mg Oral BID WC  . cefTRIAXone (ROCEPHIN)  IV  2 g Intravenous Q24H  . folic acid  3 mg Oral Daily  . heparin  5,000 Units Subcutaneous Q8H  . levothyroxine  50 mcg Oral QAC breakfast  . predniSONE  5 mg Oral Daily  . sodium chloride flush  3 mL Intravenous Q12H   Continuous Infusions: . sodium chloride 50 mL/hr at 10/04/15 0144     LOS: 2 days   Time spent: 35 minutes   Lonia Blood, MD [facility] Office  [phone] Pager - Text Page per Loretha Stapler as per below:  On-Call/Text Page:      [email or web address]      password [email or web address]  If 7PM-7AM, please contact night-coverage [email or web address] Password [email or web address] 10/04/2015, 11:30 AM

## 2015-10-05 DIAGNOSIS — I5042 Chronic combined systolic (congestive) and diastolic (congestive) heart failure: Secondary | ICD-10-CM

## 2015-10-05 LAB — BASIC METABOLIC PANEL
Anion gap: 7 (ref 5–15)
BUN: 12 mg/dL (ref 6–20)
CALCIUM: 8.1 mg/dL — AB (ref 8.9–10.3)
CO2: 25 mmol/L (ref 22–32)
Chloride: 100 mmol/L — ABNORMAL LOW (ref 101–111)
Creatinine, Ser: 0.81 mg/dL (ref 0.61–1.24)
GFR calc Af Amer: >60 mL/min (ref >60)
GLUCOSE: 84 mg/dL (ref 65–99)
Potassium: 3.4 mmol/L — ABNORMAL LOW (ref 3.5–5.1)
Sodium: 132 mmol/L — ABNORMAL LOW (ref 135–145)

## 2015-10-05 LAB — GLUCOSE, CAPILLARY
Glucose-Capillary: 95 mg/dL (ref 65–99)
Glucose-Capillary: 117 mg/dL — ABNORMAL HIGH (ref 65–99)

## 2015-10-05 LAB — MAGNESIUM: MAGNESIUM: 2 mg/dL (ref 1.7–2.4)

## 2015-10-05 MED ORDER — POTASSIUM CHLORIDE CRYS ER 20 MEQ PO TBCR
40.0000 meq | EXTENDED_RELEASE_TABLET | Freq: Once | ORAL | Status: AC
  Administered 2015-10-05: 40 meq via ORAL
  Filled 2015-10-05: qty 2

## 2015-10-05 NOTE — Progress Notes (Signed)
[place] TEAM 1 - Stepdown/ICU TEAM  Hunter Choi  GUY:[id number] DOB: 1927/06/18 DOA: 10/02/2015 PCP: Clelia Schaumann, MD    Brief Narrative:  82 y.o. male with history of ischemic chronic combined systolic and diastolic heart failure with an EF of 20-25%, severe aortic stenosis status post TAVR, HTN, RA on low-dose chronic prednisone and methotrexate, chronic thrombocytopenia, chronic anemia, BPH, malnutrition and significant hearing loss admitted for fever and sepsis from UTI.    Subjective: The patient is alert and conversant. 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 He appears slightly confused.   Overnight he had a tmax of 102.   Assessment & Plan:  Sepsis due to E coli urinary tract infection w/ bacteremia (2 of 2 blood cx) -stress dose steroids stopped - back on home pred dose - confirmed sensitive to Rocephin - unclear why he had fever yesterday. Repeat blood cultures ordered today.   Coronary artery disease / Demand ischemia  -No chest pain - troponin peaked at 2.8 - hemodynamically stable - no new complaints.   Chronic combined systolic and diastolic congestive heart failure  -on Coreg - resumed lasix on 7/9, monitor renal parameters on lasix. Would consider resuming the rest of the home meds.  Filed Weights   10/03/15 0500 10/04/15 0544 10/04/15 2323  Weight: 67.9 kg (149 lb 11.1 oz) 69.6 kg (153 lb 7 oz) 67 kg (147 lb 11.3 oz)    Hypomagnesemia -Replaced - follow up in a.m is normal.   Hypokalemia: repleted and repeat in am.   Rheumatoid arthritis  -Methotrexate on Monday Wednesday Friday -chronic prednisone 5 mg daily   S/P TAVR  -No evidence of valve dysfunction at this time  Thrombocytopenia  -Chronic and stable  Recent Labs Lab 10/02/15 0800 10/03/15 0546 10/04/15 0216  PLT 112* 84* 100*    BPH  Malnutrition of moderate degree  Normocytic Anemia Stable  Hard of hearing  DVT prophylaxis: SQ  heparin Code Status: FULL CODE Family Communication: no family present at time of exam  Disposition Plan: pending PT EVAL.   Consultants:  PCCM  Procedures: none  Antimicrobials:  Rocephin 7/8> Zosyn 7/7 Vanc 7/7  Objective: Blood pressure 165/62, pulse 68, temperature 98.4 F (36.9 C), temperature source Oral, resp. rate 18, height 5\' 7"  (1.702 m), weight 67 kg (147 lb 11.3 oz), SpO2 97 %.  Intake/Output Summary (Last 24 hours) at 10/05/15 1742 Last data filed at 10/05/15 1340  Gross per 24 hour  Intake    493 ml  Output   3475 ml  Net  -2982 ml   Filed Weights   10/03/15 0500 10/04/15 0544 10/04/15 2323  Weight: 67.9 kg (149 lb 11.1 oz) 69.6 kg (153 lb 7 oz) 67 kg (147 lb 11.3 oz)    Examination: General: No acute respiratory distress - alert but hard of hearing, slughtly confused.  Lungs: Clear to auscultation bilaterally - no wheezes or crackles Cardiovascular: Regular rate and rhythm without murmur gallop or rub  Abdomen: Nontender, nondistended, soft, bowel sounds positive, no rebound, no ascites Extremities: No significant cyanosis, clubbing, or edema bilateral lower extremities  CBC:  Recent Labs Lab 10/02/15 0800 10/03/15 0546 10/04/15 0216  WBC 10.7* 13.0* 11.8*  NEUTROABS 9.5*  --   --   HGB 12.2* 10.2* 9.3*  HCT 37.9* 32.3* 29.2*  MCV 94.8 93.9 94.5  PLT 112* 84* 100*   Basic Metabolic Panel:  Recent Labs Lab 10/02/15 0800 10/02/15 1828 10/03/15  1610 10/04/15 0216 10/05/15 0316  NA 131*  --  134* 130* 132*  K 4.0  --  4.1 3.5 3.4*  CL 97*  --  107 102 100*  CO2 24  --  19* 23 25  GLUCOSE 86  --  105* 127* 84  BUN 28*  --  22* 16 12  CREATININE 1.15  --  0.96 0.79 0.81  CALCIUM 8.8*  --  7.6* 7.6* 8.1*  MG  --  1.6*  --   --  2.0  PHOS  --  2.9  --   --   --    GFR: Estimated Creatinine Clearance: 61.2 mL/min (by C-G formula based on Cr of 0.81).  Liver Function Tests:  Recent Labs Lab 10/02/15 0800 10/03/15 0546  10/04/15 0216  AST 31 56* 42*  ALT 17 35 28  ALKPHOS 69 70 59  BILITOT 0.6 0.5 0.4  PROT 7.0 5.5* 5.0*  ALBUMIN 3.5 2.5* 2.5*    Coagulation Profile:  Recent Labs Lab 10/02/15 1612 10/03/15 0546  INR 1.58* 1.39    Cardiac Enzymes:  Recent Labs Lab 10/02/15 1828 10/02/15 2333 10/03/15 0546  TROPONINI 2.80* 2.36* 0.81*    HbA1C: HGB A1C MFR BLD  Date/Time Value Ref Range Status  05/01/2015 05:17 PM 6.0* 4.8 - 5.6 % Final    Comment:    (NOTE)         Pre-diabetes: 5.7 - 6.4         Diabetes: >6.4         Glycemic control for adults with diabetes: <7.0   01/11/2013 01:56 PM 6.0* <5.7 % Final    Comment:    (NOTE)                                                                       According to the ADA Clinical Practice Recommendations for 2011, when HbA1c is used as a screening test:  >=6.5%   Diagnostic of Diabetes Mellitus           (if abnormal result is confirmed) 5.7-6.4%   Increased risk of developing Diabetes Mellitus References:Diagnosis and Classification of Diabetes Mellitus,Diabetes Care,2011,34(Suppl 1):S62-S69 and Standards of Medical Care in         Diabetes - 2011,Diabetes Care,2011,34 (Suppl 1):S11-S61.     Recent Results (from the past 240 hour(s))  Culture, blood (Routine x 2)     Status: Abnormal   Collection Time: 10/02/15  7:50 AM  Result Value Ref Range Status   Specimen Description BLOOD RIGHT ANTECUBITAL  Final   Special Requests BOTTLES DRAWN AEROBIC AND ANAEROBIC  10CC  Final   Culture  Setup Time   Final    GRAM NEGATIVE RODS IN BOTH AEROBIC AND ANAEROBIC BOTTLES CRITICAL RESULT CALLED TO, READ BACK BY AND VERIFIED WITH: L. POWELL,PHARM AT 2234 ON [id number] BY S. YARBROUGH    Culture ESCHERICHIA COLI (A)  Final   Report Status 10/04/2015 FINAL  Final   Organism ID, Bacteria ESCHERICHIA COLI  Final      Susceptibility   Escherichia coli - MIC*    AMPICILLIN >=32 RESISTANT Resistant     CEFAZOLIN >=64 RESISTANT Resistant      CEFEPIME <=1 SENSITIVE Sensitive  CEFTAZIDIME <=1 SENSITIVE Sensitive     CEFTRIAXONE <=1 SENSITIVE Sensitive     CIPROFLOXACIN <=0.25 SENSITIVE Sensitive     GENTAMICIN <=1 SENSITIVE Sensitive     IMIPENEM <=0.25 SENSITIVE Sensitive     TRIMETH/SULFA <=20 SENSITIVE Sensitive     AMPICILLIN/SULBACTAM >=32 RESISTANT Resistant     PIP/TAZO 64 INTERMEDIATE Intermediate     * ESCHERICHIA COLI  Blood Culture ID Panel (Reflexed)     Status: Abnormal   Collection Time: 10/02/15  7:50 AM  Result Value Ref Range Status   Enterococcus species NOT DETECTED NOT DETECTED Final   Vancomycin resistance NOT DETECTED NOT DETECTED Final   Listeria monocytogenes NOT DETECTED NOT DETECTED Final   Staphylococcus species NOT DETECTED NOT DETECTED Final   Staphylococcus aureus NOT DETECTED NOT DETECTED Final   Methicillin resistance NOT DETECTED NOT DETECTED Final   Streptococcus species NOT DETECTED NOT DETECTED Final   Streptococcus agalactiae NOT DETECTED NOT DETECTED Final   Streptococcus pneumoniae NOT DETECTED NOT DETECTED Final   Streptococcus pyogenes NOT DETECTED NOT DETECTED Final   Acinetobacter baumannii NOT DETECTED NOT DETECTED Final   Enterobacteriaceae species DETECTED (A) NOT DETECTED Final    Comment: CRITICAL RESULT CALLED TO, READ BACK BY AND VERIFIED WITH: L. POWELL,PHARM AT 2234 ON [id number] BY S. YARBROUGH    Enterobacter cloacae complex NOT DETECTED NOT DETECTED Final   Escherichia coli DETECTED (A) NOT DETECTED Final    Comment: CRITICAL RESULT CALLED TO, READ BACK BY AND VERIFIED WITH: L. POWELL,PHARM AT 2234 ON [id number] BY S. YARBROUGH    Klebsiella oxytoca NOT DETECTED NOT DETECTED Final   Klebsiella pneumoniae NOT DETECTED NOT DETECTED Final   Proteus species NOT DETECTED NOT DETECTED Final   Serratia marcescens NOT DETECTED NOT DETECTED Final   Carbapenem resistance NOT DETECTED NOT DETECTED Final   Haemophilus influenzae NOT DETECTED NOT DETECTED Final   Neisseria  meningitidis NOT DETECTED NOT DETECTED Final   Pseudomonas aeruginosa NOT DETECTED NOT DETECTED Final   Candida albicans NOT DETECTED NOT DETECTED Final   Candida glabrata NOT DETECTED NOT DETECTED Final   Candida krusei NOT DETECTED NOT DETECTED Final   Candida parapsilosis NOT DETECTED NOT DETECTED Final   Candida tropicalis NOT DETECTED NOT DETECTED Final  Culture, blood (Routine x 2)     Status: Abnormal   Collection Time: 10/02/15  8:00 AM  Result Value Ref Range Status   Specimen Description BLOOD RIGHT HAND  Final   Special Requests BOTTLES DRAWN AEROBIC AND ANAEROBIC  5CC  Final   Culture  Setup Time   Final    GRAM NEGATIVE RODS IN BOTH AEROBIC AND ANAEROBIC BOTTLES CRITICAL RESULT CALLED TO, READ BACK BY AND VERIFIED WITH: L POWELL, PHARM AT 2234 ON [id number] BY S. YARBROUGH    Culture (A)  Final    ESCHERICHIA COLI SUSCEPTIBILITIES PERFORMED ON PREVIOUS CULTURE WITHIN THE LAST 5 DAYS.    Report Status 10/04/2015 FINAL  Final  Urine culture     Status: Abnormal   Collection Time: 10/02/15 10:13 AM  Result Value Ref Range Status   Specimen Description URINE, CATHETERIZED  Final   Special Requests NONE  Final   Culture >=100,000 COLONIES/mL ESCHERICHIA COLI (A)  Final   Report Status 10/04/2015 FINAL  Final   Organism ID, Bacteria ESCHERICHIA COLI (A)  Final      Susceptibility   Escherichia coli - MIC*    AMPICILLIN >=32 RESISTANT Resistant     CEFAZOLIN >=64 RESISTANT Resistant  CEFTRIAXONE <=1 SENSITIVE Sensitive     CIPROFLOXACIN <=0.25 SENSITIVE Sensitive     GENTAMICIN <=1 SENSITIVE Sensitive     IMIPENEM <=0.25 SENSITIVE Sensitive     NITROFURANTOIN <=16 SENSITIVE Sensitive     TRIMETH/SULFA <=20 SENSITIVE Sensitive     AMPICILLIN/SULBACTAM >=32 RESISTANT Resistant     PIP/TAZO >=128 RESISTANT Resistant     * >=100,000 COLONIES/mL ESCHERICHIA COLI  MRSA PCR Screening     Status: None   Collection Time: 10/02/15  6:12 PM  Result Value Ref Range Status    MRSA by PCR NEGATIVE NEGATIVE Final    Comment:        The GeneXpert MRSA Assay (FDA approved for NASAL specimens only), is one component of a comprehensive MRSA colonization surveillance program. It is not intended to diagnose MRSA infection nor to guide or monitor treatment for MRSA infections.      Scheduled Meds: . aspirin EC  81 mg Oral Daily  . carvedilol  6.25 mg Oral BID WC  . cefTRIAXone (ROCEPHIN)  IV  2 g Intravenous Q24H  . folic acid  3 mg Oral Daily  . furosemide  40 mg Oral Daily  . heparin  5,000 Units Subcutaneous Q8H  . levothyroxine  50 mcg Oral QAC breakfast  . predniSONE  5 mg Oral Daily  . sodium chloride flush  3 mL Intravenous Q12H   Continuous Infusions: . sodium chloride 10 mL/hr at 10/04/15 1302     LOS: 3 days   Time spent: 25 minutes   Kathlen Mody, MD [facility] [phone] Office  [phone] Pager - Text Page per Loretha Stapler as per below:  On-Call/Text Page:      [email or web address]      password [email or web address]  If 7PM-7AM, please contact night-coverage [email or web address] Password [email or web address] 10/05/2015, 5:42 PM

## 2015-10-05 NOTE — Progress Notes (Signed)
Paged hospitalist on-call. Pt having frequent PVCs and in an out of bigemny. Waiting for callback

## 2015-10-05 NOTE — Evaluation (Signed)
Physical Therapy Evaluation Patient Details Name: Hunter Choi MRN: [id number] DOB: 01/03/1928 Today's Date: 10/05/2015   History of Present Illness  Pt is an 82 y/o male with a PMH significant for multiple medical and cardiac problems, including EF of 20% and severe aortic stenosis. Pt very HOH. Pt presents with fever and admitted with sepsis due to UTI.  Clinical Impression  Pt admitted with above diagnosis. Pt currently with functional limitations due to the deficits listed below (see PT Problem List). At the time of PT eval pt was able to perform transfers and ambulation with supervision to close min guard assist for safety. Pt grossly unsteady and states he feels weaker than normal. Per pt will have son available at home 24 hours for assistance. Pt will benefit from skilled PT to increase their independence and safety with mobility to allow discharge to the venue listed below.       Follow Up Recommendations Home health PT;Supervision for mobility/OOB    Equipment Recommendations  None recommended by PT    Recommendations for Other Services       Precautions / Restrictions Precautions Precautions: Fall Restrictions Weight Bearing Restrictions: No      Mobility  Bed Mobility Overal bed mobility: Needs Assistance Bed Mobility: Supine to Sit;Sit to Supine     Supine to sit: Supervision Sit to supine: Supervision   General bed mobility comments: Increased time and heavy use of rails to complete transition to/from EOB. HOB slightly elevated for transfer to sitting.   Transfers Overall transfer level: Needs assistance Equipment used: Rolling walker (2 wheeled) Transfers: Sit to/from Stand Sit to Stand: Min guard         General transfer comment: Pt was able to power-up to full standing position without physical assistance. Close guard provided for safety however no gross LOB noted.   Ambulation/Gait Ambulation/Gait assistance: Min guard Ambulation Distance (Feet):  75 Feet Assistive device: Rolling walker (2 wheeled) Gait Pattern/deviations: Decreased stride length;Shuffle;Trunk flexed Gait velocity: Decreased Gait velocity interpretation: Below normal speed for age/gender General Gait Details: Grossly flexed posture with heavy lean on RW initially. Pt was able to somewhat improve posture with cueing but not maintain. Fatigues quickly.   Stairs            Wheelchair Mobility    Modified Rankin (Stroke Patients Only)       Balance Overall balance assessment: Needs assistance Sitting-balance support: Feet supported;No upper extremity supported Sitting balance-Leahy Scale: Good     Standing balance support: No upper extremity supported;During functional activity Standing balance-Leahy Scale: Fair Standing balance comment: Statically, able to maintain standing balance however required UE support for all dynamic activity.                              Pertinent Vitals/Pain Pain Assessment: 0-10 Pain Score: 4  Pain Location: R knee Pain Descriptors / Indicators: Sore Pain Intervention(s): Limited activity within patient's tolerance;Monitored during session;Repositioned    Home Living Family/patient expects to be discharged to:: Private residence Living Arrangements: Spouse/significant other;Children Available Help at Discharge: Family;Available 24 hours/day Type of Home: Apartment Home Access: Stairs to enter   Entrance Stairs-Number of Steps: 1 Home Layout: One level Home Equipment: Tub bench;Walker - 4 wheels;Cane - single point      Prior Function Level of Independence: Independent with assistive device(s)         Comments: Able to do all bathing and dressing. Used the rollator  all the time.      Hand Dominance   Dominant Hand: Right    Extremity/Trunk Assessment   Upper Extremity Assessment: Defer to OT evaluation           Lower Extremity Assessment: Generalized weakness      Cervical /  Trunk Assessment: Kyphotic (Forward head/rounded shoulder posture)  Communication   Communication: HOH  Cognition Arousal/Alertness: Awake/alert Behavior During Therapy: WFL for tasks assessed/performed Overall Cognitive Status: Within Functional Limits for tasks assessed                      General Comments      Exercises        Assessment/Plan    PT Assessment Patient needs continued PT services  PT Diagnosis Difficulty walking   PT Problem List Decreased strength;Decreased range of motion;Decreased activity tolerance;Decreased balance;Decreased mobility;Decreased safety awareness;Decreased knowledge of use of DME;Decreased knowledge of precautions;Cardiopulmonary status limiting activity  PT Treatment Interventions DME instruction;Gait training;Stair training;Functional mobility training;Therapeutic activities;Therapeutic exercise;Neuromuscular re-education;Patient/family education   PT Goals (Current goals can be found in the Care Plan section) Acute Rehab PT Goals Patient Stated Goal: Return home at d/c.  PT Goal Formulation: With patient Time For Goal Achievement: 10/12/15 Potential to Achieve Goals: Good    Frequency Min 3X/week   Barriers to discharge        Co-evaluation               End of Session Equipment Utilized During Treatment: Gait belt Activity Tolerance: Patient limited by fatigue Patient left: in bed;with bed alarm set;with call bell/phone within reach Nurse Communication: Mobility status         Time: 4540-9811 PT Time Calculation (min) (ACUTE ONLY): 26 min   Charges:   PT Evaluation $PT Eval Moderate Complexity: 1 Procedure PT Treatments $Gait Training: 8-22 mins   PT G Codes:        Conni Slipper October 20, 2015, 10:40 AM   Conni Slipper, PT, DPT Acute Rehabilitation Services Pager: [phone]

## 2015-10-05 NOTE — Care Management Important Message (Signed)
Important Message  Patient Details  Name: SHAHZAIB AZEVEDO MRN: [id number] Date of Birth: [birth year]   Medicare Important Message Given:  Yes    Bernadette Hoit 10/05/2015, 8:18 AM

## 2015-10-05 NOTE — Evaluation (Signed)
Occupational Therapy Evaluation Patient Details Name: Hunter Choi MRN: [id number] DOB: [birth year] Today's Date: 10/05/2015    History of Present Illness Pt is an 82 y/o male with a PMH significant for multiple medical and cardiac problems, including EF of 20% and severe aortic stenosis. PT VERY HOH. Pt presents with fever and admitted with sepsis due to UTI.   Clinical Impression   This 82 yo male admitted for above presents to acute OT with deficits below (see OT problem list) thus affecting his PLOF of being Min A for basic ADLs per his report. He will benefit from acute OT with follow up HHOT as long as he has 24 hour S with prn A (especially if he is up on his feet)    Follow Up Recommendations  Home health OT;Supervision/Assistance - 24 hour    Equipment Recommendations  3 in 1 bedside comode       Precautions / Restrictions Precautions Precautions: Fall Restrictions Weight Bearing Restrictions: No      Mobility Bed Mobility Overal bed mobility: Needs Assistance Bed Mobility: Supine to Sit;Sit to Supine     Supine to sit: Supervision Sit to supine: Supervision   General bed mobility comments: Increased time and heavy use of  left rail to complete transition to/from EOB. HOB slightly elevated for transfer to sitting.   Transfers Overall transfer level: Needs assistance Equipment used: Rolling walker (2 wheeled) Transfers: Sit to/from Stand Sit to Stand: Min assist            Balance Overall balance assessment: Needs assistance Sitting-balance support: Feet supported;No upper extremity supported Sitting balance-Leahy Scale: Good     Standing balance support: Bilateral upper extremity supported;During functional activity Standing balance-Leahy Scale: Poor Standing balance comment: static standing fair; dynamic standing poor                            ADL Overall ADL's : Needs assistance/impaired Eating/Feeding: Set up;Sitting   Grooming:  Moderate assistance;Sitting   Upper Body Bathing: Minimal assitance;Sitting   Lower Body Bathing: Moderate assistance (min A sit<>stand)   Upper Body Dressing : Moderate assistance;Sitting   Lower Body Dressing: Moderate assistance (min A sit<>stand)   Toilet Transfer: Minimal assistance;Ambulation;RW (bed>door>back to bed)   Toileting- Clothing Manipulation and Hygiene: Minimal assistance;Sit to/from stand                         Pertinent Vitals/Pain Pain Assessment: No/denies pain Pain Score: 4  Pain Location: R knee Pain Descriptors / Indicators: Sore Pain Intervention(s): Limited activity within patient's tolerance;Monitored during session;Repositioned     Hand Dominance Right   Extremity/Trunk Assessment Upper Extremity Assessment Upper Extremity Assessment: Generalized weakness;LUE deficits/detail;RUE deficits/detail RUE Deficits / Details: Severe RA looking hands RUE Coordination: decreased fine motor LUE Deficits / Details: Severe RA looking hands LUE Coordination: decreased fine motor     Communication Communication Communication: HOH (VERY)   Cognition Arousal/Alertness: Awake/alert Behavior During Therapy: WFL for tasks assessed/performed Overall Cognitive Status: Within Functional Limits for tasks assessed                                Home Living Family/patient expects to be discharged to:: Private residence Living Arrangements: Spouse/significant other;Children Available Help at Discharge: Family;Available 24 hours/day Type of Home: Apartment Home Access: Stairs to enter Entrance Stairs-Number of Steps: 1   Home  Layout: One level     Bathroom Shower/Tub: Chief Strategy Officer: Standard Bathroom Accessibility: Yes   Home Equipment: Tub bench;Walker - 4 wheels;Cane - single point          Prior Functioning/Environment Level of Independence: Independent with assistive device(s)        Comments: Pt  reports he could not wash his feet or get his socks and shoes on (his one helped him); uses a rollator at home    OT Diagnosis: Generalized weakness   OT Problem List: Decreased range of motion;Impaired balance (sitting and/or standing);Decreased activity tolerance (decreased activity tolerance for more than inside his room)   OT Treatment/Interventions: Self-care/ADL training;Patient/family education;Balance training;Therapeutic activities;DME and/or AE instruction    OT Goals(Current goals can be found in the care plan section) Acute Rehab OT Goals Patient Stated Goal: Return home at d/c.  OT Goal Formulation: With patient Time For Goal Achievement: 10/19/15 Potential to Achieve Goals: Good  OT Frequency: Min 2X/week              End of Session Equipment Utilized During Treatment: Gait belt;Rolling walker Nurse Communication:  (NT- moblility status)  Activity Tolerance: Patient tolerated treatment well Patient left: in bed;with call bell/phone within reach;with bed alarm set   Time: 5170-0174 OT Time Calculation (min): 19 min Charges:  OT General Charges $OT Visit: 1 Procedure OT Evaluation $OT Eval Moderate Complexity: 1 Procedure  Evette Georges [phone] 10/05/2015, 12:35 PM

## 2015-10-06 LAB — CBC
HEMATOCRIT: 35.1 % — AB (ref 39.0–52.0)
HEMOGLOBIN: 11.4 g/dL — AB (ref 13.0–17.0)
MCH: 29.9 pg (ref 26.0–34.0)
MCHC: 32.5 g/dL (ref 30.0–36.0)
MCV: 92.1 fL (ref 78.0–100.0)
Platelets: 122 10*3/uL — ABNORMAL LOW (ref 150–400)
RBC: 3.81 MIL/uL — ABNORMAL LOW (ref 4.22–5.81)
RDW: 16.9 % — ABNORMAL HIGH (ref 11.5–15.5)
WBC: 9.6 10*3/uL (ref 4.0–10.5)

## 2015-10-06 LAB — BASIC METABOLIC PANEL
ANION GAP: 9 (ref 5–15)
BUN: 17 mg/dL (ref 6–20)
CHLORIDE: 95 mmol/L — AB (ref 101–111)
CO2: 24 mmol/L (ref 22–32)
CREATININE: 1.03 mg/dL (ref 0.61–1.24)
Calcium: 8.1 mg/dL — ABNORMAL LOW (ref 8.9–10.3)
GFR calc non Af Amer: >60 mL/min (ref >60)
Glucose, Bld: 141 mg/dL — ABNORMAL HIGH (ref 65–99)
Potassium: 3.7 mmol/L (ref 3.5–5.1)
Sodium: 128 mmol/L — ABNORMAL LOW (ref 135–145)

## 2015-10-06 LAB — SODIUM: Sodium: 128 mmol/L — ABNORMAL LOW (ref 135–145)

## 2015-10-06 MED ORDER — CEFIXIME 400 MG PO CAPS: 400.0000 mg | ORAL_CAPSULE | Freq: Every day | ORAL | Status: DC

## 2015-10-06 NOTE — Progress Notes (Signed)
Pt discharged to home with son. Discharge education discussed with son and pt. Family and pt both verbalized understanding and have no further questions. Tele and IV removed. Pt escorted to family car via wheelchair.   Johnnay Pleitez M

## 2015-10-06 NOTE — Care Management Note (Addendum)
Case Management Note Donn Pierini RN, BSN Unit 2W-Case Manager [phone]  Patient Details  Name: Hunter Choi MRN: [id number] Date of Birth: 29-Jun-1927  Subjective/Objective:   Pt admitted with sepsis                 Action/Plan: PTA pt lived at home- recommendations for HHPT/OT- MD to placed [facility] orders- in to speak with pt to offer choice for [facility] agency- spoke with pt at bedside who is agreeable to [facility] services- states that he has had [facility] services before- choice offered- pt states he would like to use [facility] for services- referral called to Darl Pikes with [facility] for HH-PT/OT- per pt he has RW at home x2- he states that he does not need 3n1 or BSC for home.   Expected Discharge Date:    10/06/15              Expected Discharge Plan:  Home w Home Health Services  In-House Referral:     Discharge planning Services  CM Consult  Post Acute Care Choice:  Home Health Choice offered to:  Patient  DME Arranged:  N/A DME Agency:  NA  HH Arranged: RN PT, OT HH Agency:  [facility]  Status of Service:  Completed, signed off  If discussed at Long Length of Stay Meetings, dates discussed:    Additional Comments:  Darrold Span, RN 10/06/2015, 4:26 PM

## 2015-10-06 NOTE — Discharge Summary (Signed)
Physician Discharge Summary  Hunter Choi ZOX:[id number] DOB: Sep 02, 1927 DOA: 10/02/2015  PCP: Clelia Schaumann, MD  Admit date: 10/02/2015 Discharge date: 10/06/2015  Admitted From: Home Disposition:  Home  Recommendations for Outpatient Follow-up:  1. Follow up with PCP in 1-2 weeks 2. Please obtain BMP/CBC in one week     Discharge Condition stable Diet recommendation: Heart Healthy /   Brief/Interim Summary: 82 y.o. male with history of ischemic chronic combined systolic and diastolic heart failure with an EF of 20-25%, severe aortic stenosis status post TAVR, HTN, RA on low-dose chronic prednisone and methotrexate, chronic thrombocytopenia, chronic anemia, BPH, malnutrition and significant hearing loss admitted for fever and sepsis from UTI.   Discharge Diagnoses:  Principal Problem:   Sepsis due to urinary tract infection (HCC) Active Problems:   Rheumatoid arthritis (HCC)   BPH (benign prostatic hypertrophy)   Coronary artery disease   S/P TAVR (transcatheter aortic valve replacement)   Thrombocytopenia (HCC)   Malnutrition of moderate degree (HCC)   Anemia   Chronic combined systolic and diastolic congestive heart failure (HCC)   Hard of hearing   Demand ischemia (HCC)  Sepsis due to E coli urinary tract infection w/ bacteremia (2 of 2 blood cx) -stress dose steroids stopped - back on home pred dose - confirmed sensitive to Rocephin -, discharged on cefixime. Repeat blood cultures ordered and negative so far.    Coronary artery disease / Demand ischemia  -No chest pain - troponin peaked at 2.8 - hemodynamically stable - no new complaints.   Chronic combined systolic and diastolic congestive heart failure  -on Coreg - resumed lasix on 7/9, monitor renal parameters on lasix. Would consider resuming the rest of the home meds.  Filed Weights   10/03/15 0500 10/04/15 0544 10/04/15 2323  Weight: 67.9 kg (149 lb 11.1 oz) 69.6 kg (153 lb 7 oz) 67 kg (147 lb  11.3 oz)    Hypomagnesemia -Replaced - follow up in a.m is normal.   Hypokalemia: repleted    Rheumatoid arthritis  -Methotrexate on Monday Wednesday Friday -chronic prednisone 5 mg daily   S/P TAVR  -No evidence of valve dysfunction at this time  Thrombocytopenia  -Chronic and stable  Last Labs      Recent Labs Lab 10/02/15 0800 10/03/15 0546 10/04/15 0216  PLT 112* 84* 100*      BPH  Malnutrition of moderate degree  Normocytic Anemia Stable       Discharge Instructions  Discharge Instructions    (HEART FAILURE PATIENTS) Call MD:  Anytime you have any of the following symptoms: 1) 3 pound weight gain in 24 hours or 5 pounds in 1 week 2) shortness of breath, with or without a dry hacking cough 3) swelling in the hands, feet or stomach 4) if you have to sleep on extra pillows at night in order to breathe.    Complete by:  As directed      Discharge instructions    Complete by:  As directed   Please follow up with PCP in one week.  Please check your sodium level in one week at PCP office.            Medication List    TAKE these medications        acetaminophen 325 MG tablet  Commonly known as:  TYLENOL  Take 2 tablets (650 mg total) by mouth every 6 (six) hours as needed for mild pain, moderate pain, fever or headache.  amLODipine 5 MG tablet  Commonly known as:  NORVASC  Take 0.5 tablets (2.5 mg total) by mouth daily at 3 pm.     aspirin 81 MG EC tablet  Take 1 tablet (81 mg total) by mouth daily.     carvedilol 6.25 MG tablet  Commonly known as:  COREG  Take 1 tablet (6.25 mg total) by mouth 2 (two) times daily with a meal.     Cefixime 400 MG Caps capsule  Commonly known as:  SUPRAX  Take 1 capsule (400 mg total) by mouth daily.  Start taking on:  10/07/2015     folic acid 1 MG tablet  Commonly known as:  FOLVITE  Take 3 mg by mouth daily.     furosemide 20 MG tablet  Commonly known as:  LASIX  Take 2 tablets (40 mg total)  by mouth daily.     levothyroxine 50 MCG tablet  Commonly known as:  SYNTHROID, LEVOTHROID  Take 50 mcg by mouth daily.     MELATONIN PO  Take 1 tablet by mouth daily.     methotrexate 2.5 MG tablet  Take 25 mg by mouth every Friday.     nitroGLYCERIN 0.4 MG SL tablet  Commonly known as:  NITROSTAT  Place 1 tablet (0.4 mg total) under the tongue every 5 (five) minutes as needed for chest pain.     omeprazole 20 MG capsule  Commonly known as:  PRILOSEC  Take 20 mg by mouth daily.     predniSONE 5 MG tablet  Commonly known as:  DELTASONE  Take 5 mg by mouth daily.     simvastatin 20 MG tablet  Commonly known as:  ZOCOR  Take 20 mg by mouth every evening.           Follow-up Information    Follow up with [facility].   Why:  HH-RN/PT/OT arranged- they will call you to set up home visits   Contact information:   [address] [place] [address] [phone]      Allergies  Allergen Reactions  . Ciprofloxacin Other (See Comments)    REACTION: mild delirium    Consultations:     Procedures/Studies: Dg Chest Portable 1 View  10/02/2015  CLINICAL DATA:  Fever and dry nonproductive cough. EXAM: PORTABLE CHEST 1 VIEW COMPARISON:  Chest x-ray dated 05/01/2015. FINDINGS: Cardiomediastinal silhouette is stable in size and configuration. Atherosclerotic changes again noted at the aortic arch. Median sternotomy wires appear intact and stable alignment, for presumed CABG. Mild scarring/fibrosis at the left lung base. Lungs otherwise clear. No evidence of pneumonia. No pleural effusion or pneumothorax seen. IMPRESSION: 1. No acute findings.  No evidence of pneumonia. 2. Aortic atherosclerosis. Electronically Signed   By: Bary Richard M.D.   On: 10/02/2015 09:59       Subjective: No new complaints.  Discharge Exam: Filed Vitals:   10/06/15 0929 10/06/15 1330  BP: 156/96 109/50  Pulse:  62  Temp:  98 F (36.7 C)  Resp:  18   Filed  Vitals:   10/06/15 0357 10/06/15 0631 10/06/15 0929 10/06/15 1330  BP: 170/56  156/96 109/50  Pulse: 61   62  Temp: 98.3 F (36.8 C)   98 F (36.7 C)  TempSrc: Oral   Oral  Resp: 18   18  Height:      Weight:  67.8 kg (149 lb 7.6 oz)    SpO2: 97%   95%    General:  Pt is alert, awake, not in acute distress Cardiovascular: RRR, S1/S2 +, no rubs, no gallops Respiratory: CTA bilaterally, no wheezing, no rhonchi Abdominal: Soft, NT, ND, bowel sounds + Extremities: no edema, no cyanosis    The results of significant diagnostics from this hospitalization (including imaging, microbiology, ancillary and laboratory) are listed below for reference.     Microbiology: Recent Results (from the past 240 hour(s))  Culture, blood (Routine x 2)     Status: Abnormal   Collection Time: 10/02/15  7:50 AM  Result Value Ref Range Status   Specimen Description BLOOD RIGHT ANTECUBITAL  Final   Special Requests BOTTLES DRAWN AEROBIC AND ANAEROBIC  10CC  Final   Culture  Setup Time   Final    GRAM NEGATIVE RODS IN BOTH AEROBIC AND ANAEROBIC BOTTLES CRITICAL RESULT CALLED TO, READ BACK BY AND VERIFIED WITH: L. POWELL,PHARM AT 2234 ON [id number] BY S. YARBROUGH    Culture ESCHERICHIA COLI (A)  Final   Report Status 10/04/2015 FINAL  Final   Organism ID, Bacteria ESCHERICHIA COLI  Final      Susceptibility   Escherichia coli - MIC*    AMPICILLIN >=32 RESISTANT Resistant     CEFAZOLIN >=64 RESISTANT Resistant     CEFEPIME <=1 SENSITIVE Sensitive     CEFTAZIDIME <=1 SENSITIVE Sensitive     CEFTRIAXONE <=1 SENSITIVE Sensitive     CIPROFLOXACIN <=0.25 SENSITIVE Sensitive     GENTAMICIN <=1 SENSITIVE Sensitive     IMIPENEM <=0.25 SENSITIVE Sensitive     TRIMETH/SULFA <=20 SENSITIVE Sensitive     AMPICILLIN/SULBACTAM >=32 RESISTANT Resistant     PIP/TAZO 64 INTERMEDIATE Intermediate     * ESCHERICHIA COLI  Blood Culture ID Panel (Reflexed)     Status: Abnormal   Collection Time: 10/02/15  7:50 AM   Result Value Ref Range Status   Enterococcus species NOT DETECTED NOT DETECTED Final   Vancomycin resistance NOT DETECTED NOT DETECTED Final   Listeria monocytogenes NOT DETECTED NOT DETECTED Final   Staphylococcus species NOT DETECTED NOT DETECTED Final   Staphylococcus aureus NOT DETECTED NOT DETECTED Final   Methicillin resistance NOT DETECTED NOT DETECTED Final   Streptococcus species NOT DETECTED NOT DETECTED Final   Streptococcus agalactiae NOT DETECTED NOT DETECTED Final   Streptococcus pneumoniae NOT DETECTED NOT DETECTED Final   Streptococcus pyogenes NOT DETECTED NOT DETECTED Final   Acinetobacter baumannii NOT DETECTED NOT DETECTED Final   Enterobacteriaceae species DETECTED (A) NOT DETECTED Final    Comment: CRITICAL RESULT CALLED TO, READ BACK BY AND VERIFIED WITH: L. POWELL,PHARM AT 2234 ON [id number] BY S. YARBROUGH    Enterobacter cloacae complex NOT DETECTED NOT DETECTED Final   Escherichia coli DETECTED (A) NOT DETECTED Final    Comment: CRITICAL RESULT CALLED TO, READ BACK BY AND VERIFIED WITH: L. POWELL,PHARM AT 2234 ON [id number] BY S. YARBROUGH    Klebsiella oxytoca NOT DETECTED NOT DETECTED Final   Klebsiella pneumoniae NOT DETECTED NOT DETECTED Final   Proteus species NOT DETECTED NOT DETECTED Final   Serratia marcescens NOT DETECTED NOT DETECTED Final   Carbapenem resistance NOT DETECTED NOT DETECTED Final   Haemophilus influenzae NOT DETECTED NOT DETECTED Final   Neisseria meningitidis NOT DETECTED NOT DETECTED Final   Pseudomonas aeruginosa NOT DETECTED NOT DETECTED Final   Candida albicans NOT DETECTED NOT DETECTED Final   Candida glabrata NOT DETECTED NOT DETECTED Final   Candida krusei NOT DETECTED NOT DETECTED Final   Candida parapsilosis NOT DETECTED NOT DETECTED Final  Candida tropicalis NOT DETECTED NOT DETECTED Final  Culture, blood (Routine x 2)     Status: Abnormal   Collection Time: 10/02/15  8:00 AM  Result Value Ref Range Status   Specimen  Description BLOOD RIGHT HAND  Final   Special Requests BOTTLES DRAWN AEROBIC AND ANAEROBIC  5CC  Final   Culture  Setup Time   Final    GRAM NEGATIVE RODS IN BOTH AEROBIC AND ANAEROBIC BOTTLES CRITICAL RESULT CALLED TO, READ BACK BY AND VERIFIED WITH: L POWELL, PHARM AT 2234 ON [id number] BY S. YARBROUGH    Culture (A)  Final    ESCHERICHIA COLI SUSCEPTIBILITIES PERFORMED ON PREVIOUS CULTURE WITHIN THE LAST 5 DAYS.    Report Status 10/04/2015 FINAL  Final  Urine culture     Status: Abnormal   Collection Time: 10/02/15 10:13 AM  Result Value Ref Range Status   Specimen Description URINE, CATHETERIZED  Final   Special Requests NONE  Final   Culture >=100,000 COLONIES/mL ESCHERICHIA COLI (A)  Final   Report Status 10/04/2015 FINAL  Final   Organism ID, Bacteria ESCHERICHIA COLI (A)  Final      Susceptibility   Escherichia coli - MIC*    AMPICILLIN >=32 RESISTANT Resistant     CEFAZOLIN >=64 RESISTANT Resistant     CEFTRIAXONE <=1 SENSITIVE Sensitive     CIPROFLOXACIN <=0.25 SENSITIVE Sensitive     GENTAMICIN <=1 SENSITIVE Sensitive     IMIPENEM <=0.25 SENSITIVE Sensitive     NITROFURANTOIN <=16 SENSITIVE Sensitive     TRIMETH/SULFA <=20 SENSITIVE Sensitive     AMPICILLIN/SULBACTAM >=32 RESISTANT Resistant     PIP/TAZO >=128 RESISTANT Resistant     * >=100,000 COLONIES/mL ESCHERICHIA COLI  MRSA PCR Screening     Status: None   Collection Time: 10/02/15  6:12 PM  Result Value Ref Range Status   MRSA by PCR NEGATIVE NEGATIVE Final    Comment:        The GeneXpert MRSA Assay (FDA approved for NASAL specimens only), is one component of a comprehensive MRSA colonization surveillance program. It is not intended to diagnose MRSA infection nor to guide or monitor treatment for MRSA infections.   Culture, blood (Routine X 2) w Reflex to ID Panel     Status: None (Preliminary result)   Collection Time: 10/05/15  6:25 PM  Result Value Ref Range Status   Specimen Description BLOOD LEFT  ANTECUBITAL  Final   Special Requests BOTTLES DRAWN AEROBIC AND ANAEROBIC 5CC   Final   Culture NO GROWTH < 24 HOURS  Final   Report Status PENDING  Incomplete  Culture, blood (Routine X 2) w Reflex to ID Panel     Status: None (Preliminary result)   Collection Time: 10/05/15  6:35 PM  Result Value Ref Range Status   Specimen Description BLOOD LEFT ARM  Final   Special Requests IN PEDIATRIC BOTTLE 1CC  Final   Culture NO GROWTH < 24 HOURS  Final   Report Status PENDING  Incomplete     Labs: BNP (last 3 results)  Recent Labs  05/01/15 1343 06/19/15 1023  BNP >4500.0* 340.6*   Basic Metabolic Panel:  Recent Labs Lab 10/02/15 0800 10/02/15 1828 10/03/15 0546 10/04/15 0216 10/05/15 0316 10/06/15 1115 10/06/15 1615  NA 131*  --  134* 130* 132* 128* 128*  K 4.0  --  4.1 3.5 3.4* 3.7  --   CL 97*  --  107 102 100* 95*  --   CO2 24  --  19* 23 25 24   --   GLUCOSE 86  --  105* 127* 84 141*  --   BUN 28*  --  22* 16 12 17   --   CREATININE 1.15  --  0.96 0.79 0.81 1.03  --   CALCIUM 8.8*  --  7.6* 7.6* 8.1* 8.1*  --   MG  --  1.6*  --   --  2.0  --   --   PHOS  --  2.9  --   --   --   --   --    Liver Function Tests:  Recent Labs Lab 10/02/15 0800 10/03/15 0546 10/04/15 0216  AST 31 56* 42*  ALT 17 35 28  ALKPHOS 69 70 59  BILITOT 0.6 0.5 0.4  PROT 7.0 5.5* 5.0*  ALBUMIN 3.5 2.5* 2.5*   No results for input(s): LIPASE, AMYLASE in the last 168 hours. No results for input(s): AMMONIA in the last 168 hours. CBC:  Recent Labs Lab 10/02/15 0800 10/03/15 0546 10/04/15 0216 10/06/15 1115  WBC 10.7* 13.0* 11.8* 9.6  NEUTROABS 9.5*  --   --   --   HGB 12.2* 10.2* 9.3* 11.4*  HCT 37.9* 32.3* 29.2* 35.1*  MCV 94.8 93.9 94.5 92.1  PLT 112* 84* 100* 122*   Cardiac Enzymes:  Recent Labs Lab 10/02/15 1828 10/02/15 2333 10/03/15 0546  TROPONINI 2.80* 2.36* 0.81*   BNP: Invalid input(s): POCBNP CBG:  Recent Labs Lab 10/05/15 1151 10/05/15 1619  GLUCAP  95 117*   D-Dimer No results for input(s): DDIMER in the last 72 hours. Hgb A1c No results for input(s): HGBA1C in the last 72 hours. Lipid Profile No results for input(s): CHOL, HDL, LDLCALC, TRIG, CHOLHDL, LDLDIRECT in the last 72 hours. Thyroid function studies No results for input(s): TSH, T4TOTAL, T3FREE, THYROIDAB in the last 72 hours.  Invalid input(s): FREET3 Anemia work up No results for input(s): VITAMINB12, FOLATE, FERRITIN, TIBC, IRON, RETICCTPCT in the last 72 hours. Urinalysis    Component Value Date/Time   COLORURINE YELLOW 10/02/2015 1013   APPEARANCEUR TURBID* 10/02/2015 1013   LABSPEC 1.023 10/02/2015 1013   PHURINE 5.5 10/02/2015 1013   GLUCOSEU NEGATIVE 10/02/2015 1013   HGBUR LARGE* 10/02/2015 1013   BILIRUBINUR NEGATIVE 10/02/2015 1013   KETONESUR NEGATIVE 10/02/2015 1013   PROTEINUR 100* 10/02/2015 1013   UROBILINOGEN 4.0* 11/14/2014 1849   NITRITE NEGATIVE 10/02/2015 1013   LEUKOCYTESUR LARGE* 10/02/2015 1013   Sepsis Labs Invalid input(s): PROCALCITONIN,  WBC,  LACTICIDVEN Microbiology Recent Results (from the past 240 hour(s))  Culture, blood (Routine x 2)     Status: Abnormal   Collection Time: 10/02/15  7:50 AM  Result Value Ref Range Status   Specimen Description BLOOD RIGHT ANTECUBITAL  Final   Special Requests BOTTLES DRAWN AEROBIC AND ANAEROBIC  10CC  Final   Culture  Setup Time   Final    GRAM NEGATIVE RODS IN BOTH AEROBIC AND ANAEROBIC BOTTLES CRITICAL RESULT CALLED TO, READ BACK BY AND VERIFIED WITH: L. POWELL,PHARM AT 2234 ON 12/03/15 BY S. YARBROUGH    Culture ESCHERICHIA COLI (A)  Final   Report Status 10/04/2015 FINAL  Final   Organism ID, Bacteria ESCHERICHIA COLI  Final      Susceptibility   Escherichia coli - MIC*    AMPICILLIN >=32 RESISTANT Resistant     CEFAZOLIN >=64 RESISTANT Resistant     CEFEPIME <=1 SENSITIVE Sensitive     CEFTAZIDIME <=1 SENSITIVE Sensitive  CEFTRIAXONE <=1 SENSITIVE Sensitive     CIPROFLOXACIN  <=0.25 SENSITIVE Sensitive     GENTAMICIN <=1 SENSITIVE Sensitive     IMIPENEM <=0.25 SENSITIVE Sensitive     TRIMETH/SULFA <=20 SENSITIVE Sensitive     AMPICILLIN/SULBACTAM >=32 RESISTANT Resistant     PIP/TAZO 64 INTERMEDIATE Intermediate     * ESCHERICHIA COLI  Blood Culture ID Panel (Reflexed)     Status: Abnormal   Collection Time: 10/02/15  7:50 AM  Result Value Ref Range Status   Enterococcus species NOT DETECTED NOT DETECTED Final   Vancomycin resistance NOT DETECTED NOT DETECTED Final   Listeria monocytogenes NOT DETECTED NOT DETECTED Final   Staphylococcus species NOT DETECTED NOT DETECTED Final   Staphylococcus aureus NOT DETECTED NOT DETECTED Final   Methicillin resistance NOT DETECTED NOT DETECTED Final   Streptococcus species NOT DETECTED NOT DETECTED Final   Streptococcus agalactiae NOT DETECTED NOT DETECTED Final   Streptococcus pneumoniae NOT DETECTED NOT DETECTED Final   Streptococcus pyogenes NOT DETECTED NOT DETECTED Final   Acinetobacter baumannii NOT DETECTED NOT DETECTED Final   Enterobacteriaceae species DETECTED (A) NOT DETECTED Final    Comment: CRITICAL RESULT CALLED TO, READ BACK BY AND VERIFIED WITH: L. POWELL,PHARM AT 2234 ON [id number] BY S. YARBROUGH    Enterobacter cloacae complex NOT DETECTED NOT DETECTED Final   Escherichia coli DETECTED (A) NOT DETECTED Final    Comment: CRITICAL RESULT CALLED TO, READ BACK BY AND VERIFIED WITH: L. POWELL,PHARM AT 2234 ON [id number] BY S. YARBROUGH    Klebsiella oxytoca NOT DETECTED NOT DETECTED Final   Klebsiella pneumoniae NOT DETECTED NOT DETECTED Final   Proteus species NOT DETECTED NOT DETECTED Final   Serratia marcescens NOT DETECTED NOT DETECTED Final   Carbapenem resistance NOT DETECTED NOT DETECTED Final   Haemophilus influenzae NOT DETECTED NOT DETECTED Final   Neisseria meningitidis NOT DETECTED NOT DETECTED Final   Pseudomonas aeruginosa NOT DETECTED NOT DETECTED Final   Candida albicans NOT DETECTED NOT  DETECTED Final   Candida glabrata NOT DETECTED NOT DETECTED Final   Candida krusei NOT DETECTED NOT DETECTED Final   Candida parapsilosis NOT DETECTED NOT DETECTED Final   Candida tropicalis NOT DETECTED NOT DETECTED Final  Culture, blood (Routine x 2)     Status: Abnormal   Collection Time: 10/02/15  8:00 AM  Result Value Ref Range Status   Specimen Description BLOOD RIGHT HAND  Final   Special Requests BOTTLES DRAWN AEROBIC AND ANAEROBIC  5CC  Final   Culture  Setup Time   Final    GRAM NEGATIVE RODS IN BOTH AEROBIC AND ANAEROBIC BOTTLES CRITICAL RESULT CALLED TO, READ BACK BY AND VERIFIED WITH: L POWELL, PHARM AT 2234 ON [id number] BY S. YARBROUGH    Culture (A)  Final    ESCHERICHIA COLI SUSCEPTIBILITIES PERFORMED ON PREVIOUS CULTURE WITHIN THE LAST 5 DAYS.    Report Status 10/04/2015 FINAL  Final  Urine culture     Status: Abnormal   Collection Time: 10/02/15 10:13 AM  Result Value Ref Range Status   Specimen Description URINE, CATHETERIZED  Final   Special Requests NONE  Final   Culture >=100,000 COLONIES/mL ESCHERICHIA COLI (A)  Final   Report Status 10/04/2015 FINAL  Final   Organism ID, Bacteria ESCHERICHIA COLI (A)  Final      Susceptibility   Escherichia coli - MIC*    AMPICILLIN >=32 RESISTANT Resistant     CEFAZOLIN >=64 RESISTANT Resistant     CEFTRIAXONE <=1 SENSITIVE Sensitive  CIPROFLOXACIN <=0.25 SENSITIVE Sensitive     GENTAMICIN <=1 SENSITIVE Sensitive     IMIPENEM <=0.25 SENSITIVE Sensitive     NITROFURANTOIN <=16 SENSITIVE Sensitive     TRIMETH/SULFA <=20 SENSITIVE Sensitive     AMPICILLIN/SULBACTAM >=32 RESISTANT Resistant     PIP/TAZO >=128 RESISTANT Resistant     * >=100,000 COLONIES/mL ESCHERICHIA COLI  MRSA PCR Screening     Status: None   Collection Time: 10/02/15  6:12 PM  Result Value Ref Range Status   MRSA by PCR NEGATIVE NEGATIVE Final    Comment:        The GeneXpert MRSA Assay (FDA approved for NASAL specimens only), is one component  of a comprehensive MRSA colonization surveillance program. It is not intended to diagnose MRSA infection nor to guide or monitor treatment for MRSA infections.   Culture, blood (Routine X 2) w Reflex to ID Panel     Status: None (Preliminary result)   Collection Time: 10/05/15  6:25 PM  Result Value Ref Range Status   Specimen Description BLOOD LEFT ANTECUBITAL  Final   Special Requests BOTTLES DRAWN AEROBIC AND ANAEROBIC 5CC   Final   Culture NO GROWTH < 24 HOURS  Final   Report Status PENDING  Incomplete  Culture, blood (Routine X 2) w Reflex to ID Panel     Status: None (Preliminary result)   Collection Time: 10/05/15  6:35 PM  Result Value Ref Range Status   Specimen Description BLOOD LEFT ARM  Final   Special Requests IN PEDIATRIC BOTTLE 1CC  Final   Culture NO GROWTH < 24 HOURS  Final   Report Status PENDING  Incomplete     Time coordinating discharge: Over 30 minutes  SIGNED:   Kathlen Mody, MD  [facility] 10/06/2015, 8:10 PM Pager   If 7PM-7AM, please contact night-coverage [email or web address] Password [email or web address]

## 2015-10-06 NOTE — Progress Notes (Signed)
Patient lying in bed, no needs at this time. Call light within reach. 

## 2015-10-08 NOTE — Progress Notes (Signed)
PHARMACY - PHYSICIAN COMMUNICATION CRITICAL VALUE ALERT - BLOOD CULTURE IDENTIFICATION (BCID)   Recent Results (from the past 240 hour(s))  Culture, blood (Routine x 2)     Status: Abnormal   Collection Time: 10/02/15  7:50 AM  Result Value Ref Range Status   Specimen Description BLOOD RIGHT ANTECUBITAL  Final   Special Requests BOTTLES DRAWN AEROBIC AND ANAEROBIC  10CC  Final   Culture  Setup Time   Final    GRAM NEGATIVE RODS IN BOTH AEROBIC AND ANAEROBIC BOTTLES CRITICAL RESULT CALLED TO, READ BACK BY AND VERIFIED WITH: L. POWELL,PHARM AT 2234 ON [id number] BY S. YARBROUGH    Culture ESCHERICHIA COLI (A)  Final   Report Status 10/04/2015 FINAL  Final   Organism ID, Bacteria ESCHERICHIA COLI  Final      Susceptibility   Escherichia coli - MIC*    AMPICILLIN >=32 RESISTANT Resistant     CEFAZOLIN >=64 RESISTANT Resistant     CEFEPIME <=1 SENSITIVE Sensitive     CEFTAZIDIME <=1 SENSITIVE Sensitive     CEFTRIAXONE <=1 SENSITIVE Sensitive     CIPROFLOXACIN <=0.25 SENSITIVE Sensitive     GENTAMICIN <=1 SENSITIVE Sensitive     IMIPENEM <=0.25 SENSITIVE Sensitive     TRIMETH/SULFA <=20 SENSITIVE Sensitive     AMPICILLIN/SULBACTAM >=32 RESISTANT Resistant     PIP/TAZO 64 INTERMEDIATE Intermediate     * ESCHERICHIA COLI  Blood Culture ID Panel (Reflexed)     Status: Abnormal   Collection Time: 10/02/15  7:50 AM  Result Value Ref Range Status   Enterococcus species NOT DETECTED NOT DETECTED Final   Vancomycin resistance NOT DETECTED NOT DETECTED Final   Listeria monocytogenes NOT DETECTED NOT DETECTED Final   Staphylococcus species NOT DETECTED NOT DETECTED Final   Staphylococcus aureus NOT DETECTED NOT DETECTED Final   Methicillin resistance NOT DETECTED NOT DETECTED Final   Streptococcus species NOT DETECTED NOT DETECTED Final   Streptococcus agalactiae NOT DETECTED NOT DETECTED Final   Streptococcus pneumoniae NOT DETECTED NOT DETECTED Final   Streptococcus pyogenes NOT DETECTED  NOT DETECTED Final   Acinetobacter baumannii NOT DETECTED NOT DETECTED Final   Enterobacteriaceae species DETECTED (A) NOT DETECTED Final    Comment: CRITICAL RESULT CALLED TO, READ BACK BY AND VERIFIED WITH: L. POWELL,PHARM AT 2234 ON [id number] BY S. YARBROUGH    Enterobacter cloacae complex NOT DETECTED NOT DETECTED Final   Escherichia coli DETECTED (A) NOT DETECTED Final    Comment: CRITICAL RESULT CALLED TO, READ BACK BY AND VERIFIED WITH: L. POWELL,PHARM AT 2234 ON [id number] BY S. YARBROUGH    Klebsiella oxytoca NOT DETECTED NOT DETECTED Final   Klebsiella pneumoniae NOT DETECTED NOT DETECTED Final   Proteus species NOT DETECTED NOT DETECTED Final   Serratia marcescens NOT DETECTED NOT DETECTED Final   Carbapenem resistance NOT DETECTED NOT DETECTED Final   Haemophilus influenzae NOT DETECTED NOT DETECTED Final   Neisseria meningitidis NOT DETECTED NOT DETECTED Final   Pseudomonas aeruginosa NOT DETECTED NOT DETECTED Final   Candida albicans NOT DETECTED NOT DETECTED Final   Candida glabrata NOT DETECTED NOT DETECTED Final   Candida krusei NOT DETECTED NOT DETECTED Final   Candida parapsilosis NOT DETECTED NOT DETECTED Final   Candida tropicalis NOT DETECTED NOT DETECTED Final  Culture, blood (Routine x 2)     Status: Abnormal   Collection Time: 10/02/15  8:00 AM  Result Value Ref Range Status   Specimen Description BLOOD RIGHT HAND  Final   Special Requests BOTTLES  DRAWN AEROBIC AND ANAEROBIC  5CC  Final   Culture  Setup Time   Final    GRAM NEGATIVE RODS IN BOTH AEROBIC AND ANAEROBIC BOTTLES CRITICAL RESULT CALLED TO, READ BACK BY AND VERIFIED WITH: L POWELL, PHARM AT 2234 ON [id number] BY S. YARBROUGH    Culture (A)  Final    ESCHERICHIA COLI SUSCEPTIBILITIES PERFORMED ON PREVIOUS CULTURE WITHIN THE LAST 5 DAYS.    Report Status 10/04/2015 FINAL  Final  Urine culture     Status: Abnormal   Collection Time: 10/02/15 10:13 AM  Result Value Ref Range Status   Specimen Description  URINE, CATHETERIZED  Final   Special Requests NONE  Final   Culture >=100,000 COLONIES/mL ESCHERICHIA COLI (A)  Final   Report Status 10/04/2015 FINAL  Final   Organism ID, Bacteria ESCHERICHIA COLI (A)  Final      Susceptibility   Escherichia coli - MIC*    AMPICILLIN >=32 RESISTANT Resistant     CEFAZOLIN >=64 RESISTANT Resistant     CEFTRIAXONE <=1 SENSITIVE Sensitive     CIPROFLOXACIN <=0.25 SENSITIVE Sensitive     GENTAMICIN <=1 SENSITIVE Sensitive     IMIPENEM <=0.25 SENSITIVE Sensitive     NITROFURANTOIN <=16 SENSITIVE Sensitive     TRIMETH/SULFA <=20 SENSITIVE Sensitive     AMPICILLIN/SULBACTAM >=32 RESISTANT Resistant     PIP/TAZO >=128 RESISTANT Resistant     * >=100,000 COLONIES/mL ESCHERICHIA COLI  MRSA PCR Screening     Status: None   Collection Time: 10/02/15  6:12 PM  Result Value Ref Range Status   MRSA by PCR NEGATIVE NEGATIVE Final    Comment:        The GeneXpert MRSA Assay (FDA approved for NASAL specimens only), is one component of a comprehensive MRSA colonization surveillance program. It is not intended to diagnose MRSA infection nor to guide or monitor treatment for MRSA infections.   Culture, blood (Routine X 2) w Reflex to ID Panel     Status: Abnormal (Preliminary result)   Collection Time: 10/05/15  6:25 PM  Result Value Ref Range Status   Specimen Description BLOOD LEFT ANTECUBITAL  Final   Special Requests BOTTLES DRAWN AEROBIC AND ANAEROBIC 5CC   Final   Culture  Setup Time   Final    GRAM POSITIVE COCCI AEROBIC BOTTLE ONLY CRITICAL RESULT CALLED TO, READ BACK BY AND VERIFIED WITH: J. Yosiel Thieme, PHARMD AT 1200 10/08/15 BY C. JESSUP, MLT.    Culture STAPHYLOCOCCUS SPECIES (COAGULASE NEGATIVE) (A)  Final   Report Status PENDING  Incomplete  Culture, blood (Routine X 2) w Reflex to ID Panel     Status: None (Preliminary result)   Collection Time: 10/05/15  6:35 PM  Result Value Ref Range Status   Specimen Description BLOOD LEFT ARM  Final    Special Requests IN PEDIATRIC BOTTLE 1CC  Final   Culture NO GROWTH 2 DAYS  Final   Report Status PENDING  Incomplete    Name of physician (or Provider) Contacted: Dr. Catha Gosselin Patient with [facility] bacteremia.  Discharged on Cefixime.  Repeat blood cultures showing coagulase negative staph in 1/2 sets.  Probably a contaminant.  No orders received.  Changes to prescribed antibiotics required: None  Mickeal Skinner 10/08/2015  12:15 PM

## 2015-10-10 LAB — CULTURE, BLOOD (ROUTINE X 2): Culture: NO GROWTH

## 2015-10-21 ENCOUNTER — Other Ambulatory Visit: Payer: Self-pay | Admitting: *Deleted

## 2015-10-21 MED ORDER — CARVEDILOL 6.25 MG PO TABS: 6.2500 mg | ORAL_TABLET | Freq: Two times a day (BID) | ORAL | 3 refills | Status: DC

## 2015-10-21 MED ORDER — FUROSEMIDE 20 MG PO TABS: 20.0000 mg | ORAL_TABLET | Freq: Two times a day (BID) | ORAL | 2 refills | Status: DC

## 2015-11-05 ENCOUNTER — Other Ambulatory Visit: Payer: Self-pay | Admitting: *Deleted

## 2015-11-05 DIAGNOSIS — I6523 Occlusion and stenosis of bilateral carotid arteries: Secondary | ICD-10-CM

## 2015-11-05 DIAGNOSIS — I714 Abdominal aortic aneurysm, without rupture, unspecified: Secondary | ICD-10-CM

## 2015-11-12 ENCOUNTER — Other Ambulatory Visit: Payer: Self-pay | Admitting: Interventional Cardiology

## 2015-12-02 ENCOUNTER — Encounter: Payer: Self-pay | Admitting: Interventional Cardiology

## 2015-12-17 ENCOUNTER — Encounter: Payer: Self-pay | Admitting: Interventional Cardiology

## 2015-12-17 ENCOUNTER — Ambulatory Visit (INDEPENDENT_AMBULATORY_CARE_PROVIDER_SITE_OTHER): Payer: Medicare Other | Admitting: Interventional Cardiology

## 2015-12-17 VITALS — BP 122/60 | HR 59 | Ht 65.0 in | Wt 141.4 lb

## 2015-12-17 DIAGNOSIS — I714 Abdominal aortic aneurysm, without rupture, unspecified: Secondary | ICD-10-CM

## 2015-12-17 DIAGNOSIS — I25708 Atherosclerosis of coronary artery bypass graft(s), unspecified, with other forms of angina pectoris: Secondary | ICD-10-CM

## 2015-12-17 DIAGNOSIS — I110 Hypertensive heart disease with heart failure: Secondary | ICD-10-CM

## 2015-12-17 DIAGNOSIS — Z952 Presence of prosthetic heart valve: Secondary | ICD-10-CM

## 2015-12-17 DIAGNOSIS — I5042 Chronic combined systolic (congestive) and diastolic (congestive) heart failure: Secondary | ICD-10-CM

## 2015-12-17 DIAGNOSIS — Z954 Presence of other heart-valve replacement: Secondary | ICD-10-CM

## 2015-12-17 NOTE — Progress Notes (Signed)
Cardiology Office Note    Date:  12/17/2015   ID:  Hunter Choi, DOB Aug 23, 1927, MRN [id number]  PCP:  Clelia Schaumann, MD  Cardiologist: Lesleigh Noe, MD   Chief Complaint  Patient presents with  . Cardiac Valve Problem    History of Present Illness:  Hunter Choi is a 82 y.o. male presents for follow-up of transaortic valve replacement for aortic stenosis, CAD with bypass graft occlusion, hearing loss, newly developed systolic heart failure with most recent LVEF less than 30% and chronic kidney disease.  Since last office visit Hunter Choi has died following coronary bypass/valve replacement/maze procedure. Surprisingly Hunter Choi seems to be doing quite well. There is no complaint of shortness of breath or chest pain. He made it through the arduous task of visitation following her death, the funeral, and the burial. He wants off of some of his diuretic regimen because he feels he goes to the bathroom too frequently. His appetite is very good. He denies chills and fever.     Past Medical History:  Diagnosis Date  . AAA (abdominal aortic aneurysm) (HCC)    Dr. Ashley Royalty  . Aortic stenosis, severe    a. s/p TAVR  . Blind left eye   . BPH (benign prostatic hypertrophy)    Dr. Retta Diones  . CAD (coronary artery disease) of bypass graft    Chronic occlusion of saphenous vein graft to RCA  . Coronary artery disease    Left main disease and severe 3-vessel CAD  . Diastolic heart failure (HCC)   . Embolism involving retinal artery   . GERD (gastroesophageal reflux disease)   . Hypertension   . Hypothyroidism   . Iliac artery aneurysm (HCC)    Dr. Edilia Bo  . PAF (paroxysmal atrial fibrillation) (HCC)   . 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 12/2012: mm Stephannie Peters XT transcatheter heart valve placed via transapical approach  . Stroke [facility])    mini stroke effective his eye left    Past Surgical History:  Procedure Laterality  Date  . BIOPSY PROSTATE  2005  . BUNIONECTOMY WITH HAMMERTOE RECONSTRUCTION Bilateral   . CARDIAC CATHETERIZATION    . CARDIAC CATHETERIZATION N/A 05/07/2015   Procedure: Right Heart Cath and Coronary/Graft Angiography;  Surgeon: Lyn Records, MD;  Location: [facility] INVASIVE CV LAB;  Service: Cardiovascular;  Laterality: N/A;  . CAROTID ENDARTERECTOMY Right Jan. 2, 1997  . CATARACT EXTRACTION W/ INTRAOCULAR LENS IMPLANT Right   . CHOLECYSTECTOMY N/A 01/28/2015   Procedure: LAPAROSCOPIC CHOLECYSTECTOMY WITH INTRAOPERATIVE CHOLANGIOGRAM;  Surgeon: Gaynelle Adu, MD;  Location: [facility] OR;  Service: General;  Laterality: N/A;  . COLONOSCOPY    . CORONARY ARTERY BYPASS GRAFT  01/1998   Dr. Sheliah Plane; LIMA to LAD, SVG to D1, SVG to LCx, SVG to RCA, open saphenous vein harvest via right lower extremity  . HARVEST BONE GRAFT  1982   FOR THE  ANKLE  . HEMIARTHROPLASTY HIP Left    AFTER HIP FX  . INTRAOPERATIVE TRANSESOPHAGEAL ECHOCARDIOGRAM N/A 01/15/2013   Procedure: INTRAOPERATIVE TRANSESOPHAGEAL ECHOCARDIOGRAM;  Surgeon: [name], MD;  Location: [facility] OR;  Service: Open Heart Surgery;  Laterality: N/A;  . JOINT REPLACEMENT    . LEFT AND RIGHT HEART CATHETERIZATION WITH CORONARY ANGIOGRAM N/A 12/20/2012   Procedure: LEFT AND RIGHT HEART CATHETERIZATION WITH CORONARY ANGIOGRAM;  Surgeon: Lesleigh Noe, MD;  Location: [facility] CATH LAB;  Service: Cardiovascular;  Laterality: N/A;  .  SKIN GRAFTS  1968  . TOTAL HIP ARTHROPLASTY Left 2007  . TOTAL KNEE ARTHROPLASTY  1992-2002   right-left  . TRANSCATHETER AORTIC VALVE REPLACEMENT, TRANSAPICAL N/A 01/15/2013   Procedure: TRANSCATHETER AORTIC VALVE REPLACEMENT, TRANSAPICAL;  Surgeon: [name], MD;  Location: [facility] OR;  Service: Open Heart Surgery;  Laterality: N/A;  . TRANSURETHRAL RESECTION OF PROSTATE  2012    Current Medications: Outpatient Medications Prior to Visit  Medication Sig Dispense Refill  . acetaminophen (TYLENOL) 325 MG tablet Take 2  tablets (650 mg total) by mouth every 6 (six) hours as needed for mild pain, moderate pain, fever or headache.    Marland Kitchen aspirin EC 81 MG EC tablet Take 1 tablet (81 mg total) by mouth daily.    . carvedilol (COREG) 6.25 MG tablet Take 1 tablet (6.25 mg total) by mouth 2 (two) times daily with a meal. 60 tablet 3  . folic acid (FOLVITE) 1 MG tablet Take 3 mg by mouth daily.    . furosemide (LASIX) 20 MG tablet Take 1 tablet (20 mg total) by mouth 2 (two) times daily. 60 tablet 2  . levothyroxine (SYNTHROID, LEVOTHROID) 50 MCG tablet Take 50 mcg by mouth daily.  0  . MELATONIN PO Take 1 tablet by mouth daily.    . methotrexate 2.5 MG tablet Take 25 mg by mouth every Friday.     . nitroGLYCERIN (NITROSTAT) 0.4 MG SL tablet Place 1 tablet (0.4 mg total) under the tongue every 5 (five) minutes as needed for chest pain. 25 tablet 3  . omeprazole (PRILOSEC) 20 MG capsule Take 20 mg by mouth daily.    . predniSONE (DELTASONE) 5 MG tablet Take 5 mg by mouth daily.  3  . simvastatin (ZOCOR) 20 MG tablet Take 20 mg by mouth every evening.  12  . amLODipine (NORVASC) 5 MG tablet Take 0.5 tablets (2.5 mg total) by mouth daily at 3 pm. (Patient not taking: Reported on 12/17/2015)    . Cefixime (SUPRAX) 400 MG CAPS capsule Take 1 capsule (400 mg total) by mouth daily. (Patient not taking: Reported on 12/17/2015) 10 capsule 0   No facility-administered medications prior to visit.      Allergies:   Ciprofloxacin   Social History   Social History  . Marital status: Married    Spouse name: N/A  . Number of children: N/A  . Years of education: N/A   Social History Main Topics  . Smoking status: Former Smoker    Packs/day: 0.50    Years: 47.00    Types: Cigarettes    Quit date: 03/28/1958  . Smokeless tobacco: Former Neurosurgeon    Types: Chew     Comment: ONLY USED 2 YEARS  . Alcohol use No  . Drug use: No  . Sexual activity: Not Asked   Other Topics Concern  . None   Social History Narrative  . None      Family History:  The patient's family history includes Heart attack in his brother, father, and mother; Heart disease in his brother, father, and mother; Hyperlipidemia in his brother, father, and mother; Hypertension in his brother, father, mother, and son.   ROS:   Please see the history of present illness.    Marked decrease in hearing, frequent urination, and weakness.  All other systems reviewed and are negative.   PHYSICAL EXAM:   VS:  BP 122/60   Pulse (!) 59   Ht 5\' 5"  (1.651 m)   Wt 141 lb  6.4 oz (64.1 kg)   BMI 23.53 kg/m    GEN: Well nourished, well developed, in no acute distress  HEENT: normal  Neck: no JVD, carotid bruits, or masses Cardiac: RRR; no murmurs, rubs, or gallops,no edema  Respiratory:  clear to auscultation bilaterally, normal work of breathing GI: soft, nontender, nondistended, + BS MS: no deformity or atrophy  Skin: warm and dry, no rash Neuro:  Alert and Oriented x 3, Strength and sensation are intact Psych: euthymic mood, full affect  Wt Readings from Last 3 Encounters:  12/17/15 141 lb 6.4 oz (64.1 kg)  10/06/15 149 lb 7.6 oz (67.8 kg)  09/09/15 136 lb (61.7 kg)      Studies/Labs Reviewed:   EKG:  EKG  Not performed  Recent Labs: 05/07/2015: TSH 3.050 06/19/2015: Brain Natriuretic Peptide 340.6 10/04/2015: ALT 28 10/05/2015: Magnesium 2.0 10/06/2015: BUN 17; Creatinine, Ser 1.03; Hemoglobin 11.4; Platelets 122; Potassium 3.7; Sodium 128   Lipid Panel    Component Value Date/Time   CHOL 84 02/21/2014 0210   TRIG 93 02/21/2014 0210   HDL 27 (L) 02/21/2014 0210   CHOLHDL 3.1 02/21/2014 0210   VLDL 19 02/21/2014 0210   LDLCALC 38 02/21/2014 0210    Additional studies/ records that were reviewed today include:  Echocardiogram 05/04/15:  ------------------------------------------------------------------- Study Conclusions  - Procedure narrative: Transthoracic echocardiography. Image   quality was adequate. The study was technically  difficult. - Left ventricle: The cavity size was normal. Wall thickness was   increased in a pattern of mild LVH. Systolic function was   severely reduced. The estimated ejection fraction was in the   range of 20% to 25%. Thickened/prominent LV apical false tendon.   Diffuse hypokinesis and regional variation. Doppler parameters   are consistent with abnormal left ventricular relaxation (grade 1   diastolic dysfunction). The E/e&' ratio is >15, suggesting   elevated LV filling pressure. - Aortic valve: S/p TAVR bioprosthetic valve. No significant   obstruction. Transvalvular velocity was minimally increased.   There was trivial regurgitation. - Mitral valve: Sclerotic leaflets. Mild regurgitation. - Left atrium: Severely dilated at 56 ml/m2. - Right ventricle: The cavity size was mildly dilated. Systolic   function is reduced. - Right atrium: The atrium was mildly dilated. - Inferior vena cava: The vessel was dilated. The respirophasic   diameter changes were blunted (< 50%), consistent with elevated   central venous pressure.  Impressions:  - Compared to a prior echo in 12/2014, the EF has further declined   from 40-45% to 20-25% wtih diffuse hypokinesis and regional   variation.   ASSESSMENT:    1. S/P TAVR (transcatheter aortic valve replacement)   2. Coronary artery disease involving coronary bypass graft of native heart with other forms of angina pectoris (HCC)   3. Chronic combined systolic and diastolic congestive heart failure (HCC)   4. Hypertensive heart disease with CHF (HCC)   5. AAA (abdominal aortic aneurysm) without rupture (HCC)      PLAN:  In order of problems listed above:  1. Valve auscultation suggests no significant problem with the tissue valve. No regurgitation is heard. 2. Asymptomatic with reference to angina. 3. Volume status appears excellent. He denies dyspnea. EF of 20-25% is difficult to believe at this point. I believe his LV function has  improved on medical therapy. 4. Stable on current medical regimen. No changes are made.    Medication Adjustments/Labs and Tests Ordered: Current medicines are reviewed at length with the patient today.  Concerns regarding medicines are outlined above.  Medication changes, Labs and Tests ordered today are listed in the Patient Instructions below. Patient Instructions  Medication Instructions:  Your physician recommends that you continue on your current medications as directed. Please refer to the Current Medication list given to you today.   Labwork: None ordered  Testing/Procedures: Your physician has requested that you have an echocardiogram. Echocardiography is a painless test that uses sound waves to create images of your heart. It provides your doctor with information about the size and shape of your heart and how well your heart's chambers and valves are working. This procedure takes approximately one hour. There are no restrictions for this procedure.    Follow-Up: Your physician wants you to follow-up in: 6 months with Dr.Raelynne Ludwick You will receive a reminder letter in the mail two months in advance. If you don't receive a letter, please call our office to schedule the follow-up appointment.   Any Other Special Instructions Will Be Listed Below (If Applicable).     If you need a refill on your cardiac medications before your next appointment, please call your pharmacy.      Signed, Lesleigh Noe, MD  12/17/2015 2:34 PM    [facility] [address] [place], [place], [place]  [address] Phone: [phone]; Fax: [phone]

## 2015-12-17 NOTE — Patient Instructions (Addendum)
Medication Instructions:  Your physician recommends that you continue on your current medications as directed. Please refer to the Current Medication list given to you today.   Labwork: None ordered  Testing/Procedures: Your physician has requested that you have an echocardiogram. Echocardiography is a painless test that uses sound waves to create images of your heart. It provides your doctor with information about the size and shape of your heart and how well your heart's chambers and valves are working. This procedure takes approximately one hour. There are no restrictions for this procedure. ( To be scheduled before the end of the year)   Follow-Up: Your physician wants you to follow-up in: 6 months with Dr.Smith You will receive a reminder letter in the mail two months in advance. If you don't receive a letter, please call our office to schedule the follow-up appointment.   Any Other Special Instructions Will Be Listed Below (If Applicable).     If you need a refill on your cardiac medications before your next appointment, please call your pharmacy.

## 2015-12-29 ENCOUNTER — Ambulatory Visit (HOSPITAL_COMMUNITY): Payer: Medicare Other | Attending: Interventional Cardiology

## 2015-12-29 ENCOUNTER — Other Ambulatory Visit: Payer: Self-pay

## 2015-12-29 DIAGNOSIS — I351 Nonrheumatic aortic (valve) insufficiency: Secondary | ICD-10-CM | POA: Insufficient documentation

## 2015-12-29 DIAGNOSIS — Z953 Presence of xenogenic heart valve: Secondary | ICD-10-CM | POA: Diagnosis not present

## 2015-12-29 DIAGNOSIS — I2510 Atherosclerotic heart disease of native coronary artery without angina pectoris: Secondary | ICD-10-CM | POA: Insufficient documentation

## 2015-12-29 DIAGNOSIS — I110 Hypertensive heart disease with heart failure: Secondary | ICD-10-CM | POA: Insufficient documentation

## 2015-12-29 DIAGNOSIS — I5042 Chronic combined systolic (congestive) and diastolic (congestive) heart failure: Secondary | ICD-10-CM | POA: Insufficient documentation

## 2015-12-29 DIAGNOSIS — Z87891 Personal history of nicotine dependence: Secondary | ICD-10-CM | POA: Insufficient documentation

## 2015-12-29 DIAGNOSIS — Z952 Presence of prosthetic heart valve: Secondary | ICD-10-CM | POA: Diagnosis not present

## 2015-12-29 DIAGNOSIS — R29898 Other symptoms and signs involving the musculoskeletal system: Secondary | ICD-10-CM | POA: Diagnosis not present

## 2015-12-29 DIAGNOSIS — I4891 Unspecified atrial fibrillation: Secondary | ICD-10-CM | POA: Diagnosis not present

## 2015-12-29 DIAGNOSIS — I509 Heart failure, unspecified: Secondary | ICD-10-CM | POA: Diagnosis present

## 2016-01-23 ENCOUNTER — Other Ambulatory Visit: Payer: Self-pay | Admitting: Interventional Cardiology

## 2016-06-22 ENCOUNTER — Encounter: Payer: Self-pay | Admitting: Interventional Cardiology

## 2016-07-04 NOTE — Progress Notes (Signed)
Cardiology Office Note    Date:  07/05/2016   ID:  Hunter Choi, DOB [birth year], MRN [id number]  PCP:  Leia Alf, MD (Inactive)  Cardiologist: Lesleigh Noe, MD   Chief Complaint  Patient presents with  . Congestive Heart Failure  . Cardiac Valve Problem    History of Present Illness:  Hunter Choi is a 83 y.o. male presents for follow-up of transaortic valve replacement for aortic stenosis, CAD with bypass graft occlusion, hearing loss, newly developed systolic heart failure with most recent LVEF less than 30% and chronic kidney disease.  Doing well. Stopped taking furosemide. Has not had syncope.  There is no peripheral edema and he denies chest pain. He has not needed to use nitroglycerin. He denies angina. His appetite is stable.  Past Medical History:  Diagnosis Date  . AAA (abdominal aortic aneurysm) (HCC)    Dr. Ashley Royalty  . Aortic stenosis, severe    a. s/p TAVR  . Blind left eye   . BPH (benign prostatic hypertrophy)    Dr. Retta Diones  . CAD (coronary artery disease) of bypass graft    Chronic occlusion of saphenous vein graft to RCA  . Coronary artery disease    Left main disease and severe 3-vessel CAD  . Diastolic heart failure (HCC)   . Embolism involving retinal artery   . GERD (gastroesophageal reflux disease)   . Hypertension   . Hypothyroidism   . Iliac artery aneurysm (HCC)    Dr. Edilia Bo  . PAF (paroxysmal atrial fibrillation) (HCC)   . 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 12/2012: mm Stephannie Peters XT transcatheter heart valve placed via transapical approach  . Stroke [facility])    mini stroke effective his eye left    Past Surgical History:  Procedure Laterality Date  . BIOPSY PROSTATE  2005  . BUNIONECTOMY WITH HAMMERTOE RECONSTRUCTION Bilateral   . CARDIAC CATHETERIZATION    . CARDIAC CATHETERIZATION N/A 05/07/2015   Procedure: Right Heart Cath and Coronary/Graft Angiography;  Surgeon: Lyn Records, MD;  Location: [facility] INVASIVE CV LAB;  Service: Cardiovascular;  Laterality: N/A;  . CAROTID ENDARTERECTOMY Right Jan. 2, 1997  . CATARACT EXTRACTION W/ INTRAOCULAR LENS IMPLANT Right   . CHOLECYSTECTOMY N/A 01/28/2015   Procedure: LAPAROSCOPIC CHOLECYSTECTOMY WITH INTRAOPERATIVE CHOLANGIOGRAM;  Surgeon: Gaynelle Adu, MD;  Location: [facility] OR;  Service: General;  Laterality: N/A;  . COLONOSCOPY    . CORONARY ARTERY BYPASS GRAFT  01/1998   Dr. Sheliah Plane; LIMA to LAD, SVG to D1, SVG to LCx, SVG to RCA, open saphenous vein harvest via right lower extremity  . HARVEST BONE GRAFT  1982   FOR THE  ANKLE  . HEMIARTHROPLASTY HIP Left    AFTER HIP FX  . INTRAOPERATIVE TRANSESOPHAGEAL ECHOCARDIOGRAM N/A 01/15/2013   Procedure: INTRAOPERATIVE TRANSESOPHAGEAL ECHOCARDIOGRAM;  Surgeon: [name], MD;  Location: [facility] OR;  Service: Open Heart Surgery;  Laterality: N/A;  . JOINT REPLACEMENT    . LEFT AND RIGHT HEART CATHETERIZATION WITH CORONARY ANGIOGRAM N/A 12/20/2012   Procedure: LEFT AND RIGHT HEART CATHETERIZATION WITH CORONARY ANGIOGRAM;  Surgeon: Lesleigh Noe, MD;  Location: [facility] CATH LAB;  Service: Cardiovascular;  Laterality: N/A;  . SKIN GRAFTS  1968  . TOTAL HIP ARTHROPLASTY Left 2007  . TOTAL KNEE ARTHROPLASTY  1992-2002   right-left  . TRANSCATHETER AORTIC VALVE REPLACEMENT, TRANSAPICAL N/A 01/15/2013   Procedure: TRANSCATHETER AORTIC VALVE REPLACEMENT, TRANSAPICAL;  Surgeon: [name],  MD;  Location: [facility] OR;  Service: Open Heart Surgery;  Laterality: N/A;  . TRANSURETHRAL RESECTION OF PROSTATE  2012    Current Medications: Outpatient Medications Prior to Visit  Medication Sig Dispense Refill  . acetaminophen (TYLENOL) 325 MG tablet Take 2 tablets (650 mg total) by mouth every 6 (six) hours as needed for mild pain, moderate pain, fever or headache.    Marland Kitchen amLODipine (NORVASC) 5 MG tablet Take 5 mg by mouth daily.    Marland Kitchen aspirin EC 81 MG EC tablet Take 1 tablet (81 mg total) by  mouth daily.    . carvedilol (COREG) 6.25 MG tablet Take 1 tablet (6.25 mg total) by mouth 2 (two) times daily with a meal. 60 tablet 3  . folic acid (FOLVITE) 1 MG tablet Take 3 mg by mouth daily.    Marland Kitchen levothyroxine (SYNTHROID, LEVOTHROID) 50 MCG tablet Take 50 mcg by mouth daily.  0  . MELATONIN PO Take 1 tablet by mouth daily.    . methotrexate 2.5 MG tablet Take 25 mg by mouth every Friday.     . nitroGLYCERIN (NITROSTAT) 0.4 MG SL tablet Place 1 tablet (0.4 mg total) under the tongue every 5 (five) minutes as needed for chest pain. 25 tablet 3  . omeprazole (PRILOSEC) 20 MG capsule Take 20 mg by mouth daily.    . predniSONE (DELTASONE) 5 MG tablet Take 5 mg by mouth daily.  3  . simvastatin (ZOCOR) 20 MG tablet Take 20 mg by mouth every evening.  12  . furosemide (LASIX) 20 MG tablet Take 1 tablet (20 mg total) by mouth 2 (two) times daily. (Patient not taking: Reported on 07/05/2016) 60 tablet 2  . furosemide (LASIX) 20 MG tablet TAKE 1 TABLET (20 MG TOTAL) BY MOUTH 2 (TWO) TIMES DAILY. (Patient not taking: Reported on 07/05/2016) 30 tablet 10   No facility-administered medications prior to visit.      Allergies:   Ciprofloxacin   Social History   Social History  . Marital status: Married    Spouse name: N/A  . Number of children: N/A  . Years of education: N/A   Social History Main Topics  . Smoking status: Former Smoker    Packs/day: 0.50    Years: 47.00    Types: Cigarettes    Quit date: 03/28/1958  . Smokeless tobacco: Former Neurosurgeon    Types: Chew     Comment: ONLY USED 2 YEARS  . Alcohol use No  . Drug use: No  . Sexual activity: Not Asked   Other Topics Concern  . None   Social History Narrative  . None     Family History:  The patient's family history includes Heart attack in his brother, father, and mother; Heart disease in his brother, father, and mother; Hyperlipidemia in his brother, father, and mother; Hypertension in his brother, father, mother, and son.    ROS:   Please see the history of present illness.    Decreased hearing and decreased vision both years and eyes.  All other systems reviewed and are negative.   PHYSICAL EXAM:   VS:  BP (!) 142/50   Pulse 63   Ht 5\' 5"  (1.651 m)   Wt 149 lb 12.8 oz (67.9 kg)   BMI 24.93 kg/m    GEN: Well nourished, well developed, in no acute distress  HEENT: normal  Neck: no JVD, carotid bruits, or masses Cardiac: RRR; no murmurs, rubs, or gallops,no edema  Respiratory:  clear to auscultation  bilaterally, normal work of breathing GI: soft, nontender, nondistended, + BS MS: no deformity or atrophy  Skin: warm and dry, no rash Neuro:  Alert and Oriented x 3, Strength and sensation are intact Psych: euthymic mood, full affect  Wt Readings from Last 3 Encounters:  07/05/16 149 lb 12.8 oz (67.9 kg)  12/17/15 141 lb 6.4 oz (64.1 kg)  10/06/15 149 lb 7.6 oz (67.8 kg)      Studies/Labs Reviewed:   EKG:  EKG  Sinus rhythm with first-degree AV block, left bundle branch block, and left axis deviation.  Recent Labs: 10/04/2015: ALT 28 10/05/2015: Magnesium 2.0 10/06/2015: BUN 17; Creatinine, Ser 1.03; Hemoglobin 11.4; Platelets 122; Potassium 3.7; Sodium 128   Lipid Panel    Component Value Date/Time   CHOL 84 02/21/2014 0210   TRIG 93 02/21/2014 0210   HDL 27 (L) 02/21/2014 0210   CHOLHDL 3.1 02/21/2014 0210   VLDL 19 02/21/2014 0210   LDLCALC 38 02/21/2014 0210    Additional studies/ records that were reviewed today include:  No new functional or imaging studies.    ASSESSMENT:    1. Coronary artery disease involving coronary bypass graft of native heart with angina pectoris (HCC)   2. Chronic combined systolic and diastolic congestive heart failure (HCC)   3. Hypertensive heart disease with CHF (HCC)   4. Iliac artery aneurysm (HCC)   5. S/P TAVR (transcatheter aortic valve replacement)      PLAN:  In order of problems listed above:  1. Clinically stable without angina  pectoris. 2. Clinically stable without evidence of volume overload despite discontinuation of diuretic therapy. He is doing so well, I decided not to resume diuretic therapy. 3. Blood pressures is under excellent control. No change in the current regimen. 4. Not addressed. 5. Stable with out evidence of regurgitation. Clinical follow-up in one year.  Overall, doing well. Clinical follow-up as needed or in one year. Imaging studies have not been recommended.  Medication Adjustments/Labs and Tests Ordered: Current medicines are reviewed at length with the patient today.  Concerns regarding medicines are outlined above.  Medication changes, Labs and Tests ordered today are listed in the Patient Instructions below. There are no Patient Instructions on file for this visit.   Signed, Lesleigh Noe, MD  07/05/2016 3:09 PM    [facility] [address] [place], [place], [place]  [address] Phone: [phone]; Fax: [phone]

## 2016-07-05 ENCOUNTER — Encounter: Payer: Self-pay | Admitting: Interventional Cardiology

## 2016-07-05 ENCOUNTER — Ambulatory Visit (INDEPENDENT_AMBULATORY_CARE_PROVIDER_SITE_OTHER): Payer: Medicare Other | Admitting: Interventional Cardiology

## 2016-07-05 VITALS — BP 142/50 | HR 63 | Ht 65.0 in | Wt 149.8 lb

## 2016-07-05 DIAGNOSIS — I5042 Chronic combined systolic (congestive) and diastolic (congestive) heart failure: Secondary | ICD-10-CM

## 2016-07-05 DIAGNOSIS — I25709 Atherosclerosis of coronary artery bypass graft(s), unspecified, with unspecified angina pectoris: Secondary | ICD-10-CM

## 2016-07-05 DIAGNOSIS — Z952 Presence of prosthetic heart valve: Secondary | ICD-10-CM

## 2016-07-05 DIAGNOSIS — I110 Hypertensive heart disease with heart failure: Secondary | ICD-10-CM | POA: Diagnosis not present

## 2016-07-05 DIAGNOSIS — I723 Aneurysm of iliac artery: Secondary | ICD-10-CM | POA: Diagnosis not present

## 2016-07-05 NOTE — Patient Instructions (Signed)

## 2016-09-08 ENCOUNTER — Other Ambulatory Visit (HOSPITAL_COMMUNITY): Payer: Medicare Other

## 2016-09-08 ENCOUNTER — Encounter (HOSPITAL_COMMUNITY): Payer: Medicare Other

## 2016-09-14 ENCOUNTER — Ambulatory Visit: Payer: Medicare Other | Admitting: Vascular Surgery

## 2016-09-14 ENCOUNTER — Ambulatory Visit (HOSPITAL_COMMUNITY): Payer: Medicare Other

## 2016-09-21 ENCOUNTER — Ambulatory Visit: Payer: Medicare Other | Admitting: Vascular Surgery

## 2016-11-08 ENCOUNTER — Encounter: Payer: Self-pay | Admitting: Vascular Surgery

## 2016-11-14 ENCOUNTER — Encounter (HOSPITAL_COMMUNITY): Payer: Self-pay | Admitting: Emergency Medicine

## 2016-11-14 DIAGNOSIS — I110 Hypertensive heart disease with heart failure: Secondary | ICD-10-CM | POA: Insufficient documentation

## 2016-11-14 DIAGNOSIS — Z87891 Personal history of nicotine dependence: Secondary | ICD-10-CM | POA: Diagnosis not present

## 2016-11-14 DIAGNOSIS — Z79899 Other long term (current) drug therapy: Secondary | ICD-10-CM | POA: Diagnosis not present

## 2016-11-14 DIAGNOSIS — E039 Hypothyroidism, unspecified: Secondary | ICD-10-CM | POA: Insufficient documentation

## 2016-11-14 DIAGNOSIS — Z7982 Long term (current) use of aspirin: Secondary | ICD-10-CM | POA: Diagnosis not present

## 2016-11-14 DIAGNOSIS — K047 Periapical abscess without sinus: Secondary | ICD-10-CM | POA: Diagnosis not present

## 2016-11-14 DIAGNOSIS — K0889 Other specified disorders of teeth and supporting structures: Secondary | ICD-10-CM | POA: Diagnosis present

## 2016-11-14 DIAGNOSIS — I509 Heart failure, unspecified: Secondary | ICD-10-CM | POA: Diagnosis not present

## 2016-11-14 DIAGNOSIS — R600 Localized edema: Secondary | ICD-10-CM | POA: Diagnosis not present

## 2016-11-14 DIAGNOSIS — I2510 Atherosclerotic heart disease of native coronary artery without angina pectoris: Secondary | ICD-10-CM | POA: Diagnosis not present

## 2016-11-14 NOTE — ED Notes (Signed)
Patient has trouble hearing.  Patient is sitting in waiting room in wheelchair.

## 2016-11-14 NOTE — ED Triage Notes (Signed)
Per family pt reports L lower facial swelling onset this AM. Pt has broken tooth that they believe is infected.

## 2016-11-15 ENCOUNTER — Emergency Department (HOSPITAL_COMMUNITY)
Admission: EM | Admit: 2016-11-15 | Discharge: 2016-11-15 | Disposition: A | Discharge status: Home / Self Care | Payer: Medicare Other | Attending: Emergency Medicine | Admitting: Emergency Medicine

## 2016-11-15 DIAGNOSIS — K047 Periapical abscess without sinus: Secondary | ICD-10-CM

## 2016-11-15 MED ORDER — TRAMADOL HCL 50 MG PO TABS
50.0000 mg | ORAL_TABLET | Freq: Once | ORAL | Status: AC
  Administered 2016-11-15: 50 mg via ORAL
  Filled 2016-11-15: qty 1

## 2016-11-15 MED ORDER — ONDANSETRON 4 MG PO TBDP
4.0000 mg | ORAL_TABLET | Freq: Once | ORAL | Status: AC
  Administered 2016-11-15: 4 mg via ORAL
  Filled 2016-11-15: qty 1

## 2016-11-15 MED ORDER — PENICILLIN V POTASSIUM 500 MG PO TABS: 500.0000 mg | ORAL_TABLET | Freq: Four times a day (QID) | ORAL | 0 refills | Status: AC

## 2016-11-15 MED ORDER — PENICILLIN V POTASSIUM 250 MG PO TABS
500.0000 mg | ORAL_TABLET | Freq: Once | ORAL | Status: AC
  Administered 2016-11-15: 500 mg via ORAL
  Filled 2016-11-15: qty 2

## 2016-11-15 MED ORDER — ONDANSETRON 4 MG PO TBDP: 4.0000 mg | ORAL_TABLET | Freq: Three times a day (TID) | ORAL | 0 refills | Status: DC | PRN

## 2016-11-15 MED ORDER — TRAMADOL HCL 50 MG PO TABS: 50.0000 mg | ORAL_TABLET | Freq: Three times a day (TID) | ORAL | 0 refills | Status: DC | PRN

## 2016-11-15 NOTE — Discharge Instructions (Signed)
You may take Tylenol 1000 mg every 6 hours as needed for pain and fever. Please follow-up with a dentist.

## 2016-11-15 NOTE — ED Provider Notes (Signed)
TIME SEEN: 2:43 AM   By signing my name below, I, Clarisse Gouge, attest that this documentation has been prepared under the direction and in the presence of Ward, Layla Maw, DO. Electronically signed, Clarisse Gouge, ED Scribe. 11/15/16. 2:49 AM.   CHIEF COMPLAINT: Dental pain  HPI:  Hunter Choi is a 83 y.o. male with history of AAA, aortic valve replacement, CAD presenting to the Emergency Department concerning painful L jaw swelling, dental pain within the past ~24-36 hours. Low grade fever (tMax 99) associated. He describes 6/10, constant, throbbing L lower dental pain. Pt states he has taken tylenol without relief. No primary dentist noted. No chest pain or chest discomfort. No shortness of breath. No other complaints at this time.   ROS: See HPI Constitutional: Low-grade fever Eyes: no drainage  ENT: no runny nose   Cardiovascular:  no chest pain  Resp: no SOB  GI: no vomiting GU: no dysuria Integumentary: no rash  Allergy: no hives  Musculoskeletal: no leg swelling  Neurological: no slurred speech ROS otherwise negative  PAST MEDICAL HISTORY/PAST SURGICAL HISTORY:  Past Medical History:  Diagnosis Date  . AAA (abdominal aortic aneurysm) (HCC)    Dr. Ashley Royalty  . Aortic stenosis, severe    a. s/p TAVR  . Blind left eye   . BPH (benign prostatic hypertrophy)    Dr. Retta Diones  . CAD (coronary artery disease) of bypass graft    Chronic occlusion of saphenous vein graft to RCA  . Coronary artery disease    Left main disease and severe 3-vessel CAD  . Diastolic heart failure (HCC)   . Embolism involving retinal artery   . GERD (gastroesophageal reflux disease)   . Hypertension   . Hypothyroidism   . Iliac artery aneurysm (HCC)    Dr. Edilia Bo  . PAF (paroxysmal atrial fibrillation) (HCC)   . 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 12/2012: mm Stephannie Peters XT transcatheter heart valve placed via transapical approach  . Stroke [facility])     mini stroke effective his eye left    MEDICATIONS:  Prior to Admission medications   Medication Sig Start Date End Date Taking? Authorizing Provider  acetaminophen (TYLENOL) 325 MG tablet Take 2 tablets (650 mg total) by mouth every 6 (six) hours as needed for mild pain, moderate pain, fever or headache. 02/03/15   Hongalgi, Maximino Greenland, MD  amLODipine (NORVASC) 5 MG tablet Take 5 mg by mouth daily.    [provider]  aspirin EC 81 MG EC tablet Take 1 tablet (81 mg total) by mouth daily. 01/21/13   Ardelle Balls, PA-C  carvedilol (COREG) 6.25 MG tablet Take 1 tablet (6.25 mg total) by mouth 2 (two) times daily with a meal. 10/21/15   Lyn Records, MD  fluticasone [facility]) 50 MCG/ACT nasal spray Place 1 spray into both nostrils daily.  06/29/16   [provider]  folic acid (FOLVITE) 1 MG tablet Take 3 mg by mouth daily.    [provider]  levothyroxine (SYNTHROID, LEVOTHROID) 50 MCG tablet Take 50 mcg by mouth daily. 11/06/14   [provider]  loratadine (CLARITIN) 10 MG tablet Take 10 mg by mouth daily. 06/29/16   [provider]  MELATONIN PO Take 1 tablet by mouth daily.    [provider]  methotrexate 2.5 MG tablet Take 25 mg by mouth every Friday.     [provider]  nitroGLYCERIN (NITROSTAT) 0.4 MG SL tablet Place 1  tablet (0.4 mg total) under the tongue every 5 (five) minutes as needed for chest pain. 06/19/15   Lyn Records, MD  omeprazole (PRILOSEC) 20 MG capsule Take 20 mg by mouth daily.    [provider]  predniSONE (DELTASONE) 5 MG tablet Take 5 mg by mouth daily. 04/07/15   [provider]  simvastatin (ZOCOR) 20 MG tablet Take 20 mg by mouth every evening. 12/20/14   [provider]    ALLERGIES:  Allergies  Allergen Reactions  . Ciprofloxacin Other (See Comments)    REACTION: mild delirium    SOCIAL HISTORY:  Social History  Substance Use Topics  . Smoking status: Former  Smoker    Packs/day: 0.50    Years: 47.00    Types: Cigarettes    Quit date: 03/28/1958  . Smokeless tobacco: Former Neurosurgeon    Types: Chew     Comment: ONLY USED 2 YEARS  . Alcohol use No    FAMILY HISTORY: Family History  Problem Relation Age of Onset  . Heart attack Mother   . Heart disease Mother        Before age 65  . Hyperlipidemia Mother   . Hypertension Mother   . Hypertension Son   . Heart disease Father        AAA-   . Hyperlipidemia Father   . Heart attack Father   . Hypertension Father   . Heart disease Brother        Before age 37  . Hyperlipidemia Brother   . Heart attack Brother   . Hypertension Brother     EXAM: BP (!) 149/83   Pulse 89   Temp (!) 100.4 F (38 C) (Oral)   Resp 18   Ht 5\' 5"  (1.651 m)   Wt 150 lb (68 kg)   SpO2 99%   BMI 24.96 kg/m  CONSTITUTIONAL: Alert and oriented and responds appropriately to questions. Elderly, nontoxic HEAD: Normocephalic EYES: Conjunctivae clear, pupils appear equal, EOMI ENT: normal nose; moist mucous membranes; No pharyngeal erythema or petechiae, no tonsillar hypertrophy or exudate, no uvular deviation, no unilateral swelling, no trismus or drooling, no muffled voice, normal phonation, no stridor, patient has multiple dental caries and a completely decayed tooth of the second molar all the way to the gumline on the left lower side. He does have some left sided facial swelling along the angle of the mandible with some warmth but no erythema. There is no submental swelling, no Ludwig's angina, tongue sits flat in the bottom of the mouth, no angioedema, no pain with movement of the neck. NECK: Supple, no meningismus, no nuchal rigidity, no LAD  CARD: RRR; S1 and S2 appreciated; no murmurs, no clicks, no rubs, no gallops RESP: Normal chest excursion without splinting or tachypnea; breath sounds clear and equal bilaterally; no wheezes, no rhonchi, no rales, no hypoxia or respiratory distress, speaking full  sentences ABD/GI: Normal bowel sounds; non-distended; soft, non-tender, no rebound, no guarding, no peritoneal signs, no hepatosplenomegaly BACK:  The back appears normal and is non-tender to palpation, there is no CVA tenderness EXT: Normal ROM in all joints; non-tender to palpation; no edema; normal capillary refill; no cyanosis, no calf tenderness or swelling    SKIN: Normal color for age and race; warm; no rash NEURO: Moves all extremities equally PSYCH: The patient's mood and manner are appropriate. Grooming and personal hygiene are appropriate.  MEDICAL DECISION MAKING: Patient here with likely dental abscess causing mild facial warmth and swelling.  He has no sign of Ludwig's angina. He has no other signs of infection on exam. He does not appear septic at all. Has low-grade fever here. We'll give Tylenol. I feel that we have a source for his infection. No sign of peritonsillar abscess, deep space neck infection, meningitis, pneumonia. He does not have a abscess that I can drain. He has normal phonation and no stridor, trismus or drooling. We will place him on antibiotics and discharge with pain medication. His son at bedside states he is done well with tramadol in the past. I have given him outpatient dental information as well. We have discussed at length return precautions. Patient and son are comfortable with this plan.  At this time, I do not feel there is any life-threatening condition present. I have reviewed and discussed all results (EKG, imaging, lab, urine as appropriate) and exam findings with patient/family. I have reviewed nursing notes and appropriate previous records.  I feel the patient is safe to be discharged home without further emergent workup and can continue workup as an outpatient as needed. Discussed usual and customary return precautions. Patient/family verbalize understanding and are comfortable with this plan.  Outpatient follow-up has been provided if needed. All questions  have been answered.      Ward, Layla Maw, DO 11/15/16 [phone]

## 2016-11-16 ENCOUNTER — Encounter (HOSPITAL_COMMUNITY): Payer: Medicare Other

## 2016-11-16 ENCOUNTER — Other Ambulatory Visit (HOSPITAL_COMMUNITY): Payer: Medicare Other

## 2016-11-16 ENCOUNTER — Ambulatory Visit: Payer: Medicare Other | Admitting: Vascular Surgery

## 2016-12-20 ENCOUNTER — Other Ambulatory Visit: Payer: Self-pay | Admitting: Interventional Cardiology

## 2017-01-11 ENCOUNTER — Ambulatory Visit (INDEPENDENT_AMBULATORY_CARE_PROVIDER_SITE_OTHER): Payer: Medicare Other | Admitting: Vascular Surgery

## 2017-01-11 ENCOUNTER — Encounter: Payer: Self-pay | Admitting: Vascular Surgery

## 2017-01-11 ENCOUNTER — Ambulatory Visit (HOSPITAL_COMMUNITY)
Admission: RE | Admit: 2017-01-11 | Discharge: 2017-01-11 | Disposition: A | Discharge status: Home / Self Care | Payer: Medicare Other | Source: Ambulatory Visit | Attending: Vascular Surgery | Admitting: Vascular Surgery

## 2017-01-11 VITALS — BP 128/65 | HR 68 | Temp 98.0°F | Resp 14 | Ht 65.0 in | Wt 152.0 lb

## 2017-01-11 DIAGNOSIS — I714 Abdominal aortic aneurysm, without rupture, unspecified: Secondary | ICD-10-CM

## 2017-01-11 DIAGNOSIS — I723 Aneurysm of iliac artery: Secondary | ICD-10-CM | POA: Insufficient documentation

## 2017-01-11 DIAGNOSIS — I6523 Occlusion and stenosis of bilateral carotid arteries: Secondary | ICD-10-CM

## 2017-01-11 DIAGNOSIS — I6529 Occlusion and stenosis of unspecified carotid artery: Secondary | ICD-10-CM | POA: Diagnosis not present

## 2017-01-11 LAB — VAS US CAROTID
LCCAPSYS: 53 cm/s
LEFT ECA DIAS: 0 cm/s
LEFT VERTEBRAL DIAS: 6 cm/s
Left CCA dist dias: 0 cm/s
Left CCA dist sys: -56 cm/s
Left CCA prox dias: 0 cm/s
RCCAPDIAS: -15 cm/s
RIGHT CCA MID DIAS: 15 cm/s
RIGHT ECA DIAS: 0 cm/s
Right CCA prox sys: -118 cm/s
Right cca dist sys: -128 cm/s

## 2017-01-11 NOTE — Progress Notes (Signed)
Patient name: Hunter Choi MRN: [id number] DOB: 21-Sep-1927 Sex: male   REASON FOR VISIT:    Follow up of carotid disease and abdominal aortic aneurysm.  HPI:   Hunter Choi is a pleasant 83 y.o. male,  who I last saw on 09/09/2015. He has an abdominal aortic aneurysm and a large right common iliac artery aneurysm. This had not changed in size over the last year. The maximum diameter of the distal aorta was 4.78 cm. The right common iliac artery measured 5.2 cm in maximum diameter. The left common iliac artery measured 4.5 cm in maximum diameter.  Since I saw him last, he denies any abdominal pain or back pain. He lives at home with his sons. He is ambulatory with a walker.  There have been no significant changes in his medical history. He is on prednisone for his rheumatoid arthritis which is quite debilitating. He quit smoking in 1965.  Past Medical History:  Diagnosis Date  . AAA (abdominal aortic aneurysm) (HCC)    Dr. Ashley Royalty  . Aortic stenosis, severe    a. s/p TAVR  . Blind left eye   . BPH (benign prostatic hypertrophy)    Dr. Retta Diones  . CAD (coronary artery disease) of bypass graft    Chronic occlusion of saphenous vein graft to RCA  . Coronary artery disease    Left main disease and severe 3-vessel CAD  . Diastolic heart failure (HCC)   . Embolism involving retinal artery   . GERD (gastroesophageal reflux disease)   . Hypertension   . Hypothyroidism   . Iliac artery aneurysm (HCC)    Dr. Edilia Bo  . PAF (paroxysmal atrial fibrillation) (HCC)   . 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 12/2012: mm Stephannie Peters XT transcatheter heart valve placed via transapical approach  . Stroke [facility])    mini stroke effective his eye left    Family History  Problem Relation Age of Onset  . Heart attack Mother   . Heart disease Mother        Before age 57  . Hyperlipidemia Mother   . Hypertension Mother   . Hypertension Son   . Heart  disease Father        AAA-   . Hyperlipidemia Father   . Heart attack Father   . Hypertension Father   . Heart disease Brother        Before age 26  . Hyperlipidemia Brother   . Heart attack Brother   . Hypertension Brother     SOCIAL HISTORY: Social History   Social History  . Marital status: Married    Spouse name: N/A  . Number of children: N/A  . Years of education: N/A   Occupational History  . Not on file.   Social History Main Topics  . Smoking status: Former Smoker    Packs/day: 0.50    Years: 47.00    Types: Cigarettes    Quit date: 03/28/1958  . Smokeless tobacco: Former Neurosurgeon    Types: Chew     Comment: ONLY USED 2 YEARS  . Alcohol use No  . Drug use: No  . Sexual activity: Not on file   Other Topics Concern  . Not on file   Social History Narrative  . No narrative on file    Allergies  Allergen Reactions  . Ciprofloxacin Other (See Comments)    REACTION: mild delirium    Current Outpatient Prescriptions  Medication Sig  Dispense Refill  . acetaminophen (TYLENOL) 325 MG tablet Take 2 tablets (650 mg total) by mouth every 6 (six) hours as needed for mild pain, moderate pain, fever or headache.    Marland Kitchen amLODipine (NORVASC) 5 MG tablet Take 5 mg by mouth daily.    Marland Kitchen aspirin EC 81 MG EC tablet Take 1 tablet (81 mg total) by mouth daily.    . carvedilol (COREG) 6.25 MG tablet Take 1 tablet (6.25 mg total) by mouth 2 (two) times daily with a meal. 60 tablet 3  . fluticasone (FLONASE) 50 MCG/ACT nasal spray Place 1 spray into both nostrils daily.     . folic acid (FOLVITE) 1 MG tablet Take 3 mg by mouth daily.    Marland Kitchen levothyroxine (SYNTHROID, LEVOTHROID) 50 MCG tablet Take 50 mcg by mouth daily.  0  . loratadine (CLARITIN) 10 MG tablet Take 10 mg by mouth daily.  1  . MELATONIN PO Take 1 tablet by mouth daily.    . methotrexate 2.5 MG tablet Take 25 mg by mouth every Friday.     . nitroGLYCERIN (NITROSTAT) 0.4 MG SL tablet PLACE 1 TABLET (0.4 MG TOTAL) UNDER  THE TONGUE EVERY 5 MINUTES AS NEEDED FOR CHEST PAIN 25 tablet 5  . omeprazole (PRILOSEC) 20 MG capsule Take 20 mg by mouth daily.    . predniSONE (DELTASONE) 5 MG tablet Take 5 mg by mouth daily.  3  . simvastatin (ZOCOR) 20 MG tablet Take 20 mg by mouth every evening.  12  . ondansetron (ZOFRAN ODT) 4 MG disintegrating tablet Take 1 tablet (4 mg total) by mouth every 8 (eight) hours as needed for nausea or vomiting. (Patient not taking: Reported on 01/11/2017) 20 tablet 0  . traMADol (ULTRAM) 50 MG tablet Take 1 tablet (50 mg total) by mouth every 8 (eight) hours as needed. (Patient not taking: Reported on 01/11/2017) 15 tablet 0   No current facility-administered medications for this visit.     REVIEW OF SYSTEMS:  [X]  denotes positive finding, [ ]  denotes negative finding Cardiac  Comments:  Chest pain or chest pressure:    Shortness of breath upon exertion:    Short of breath when lying flat:    Irregular heart rhythm:        Vascular    Pain in calf, thigh, or hip brought on by ambulation:    Pain in feet at night that wakes you up from your sleep:     Blood clot in your veins:    Leg swelling:         Pulmonary    Oxygen at home:    Productive cough:     Wheezing:         Neurologic    Sudden weakness in arms or legs:     Sudden numbness in arms or legs:     Sudden onset of difficulty speaking or slurred speech:    Temporary loss of vision in one eye:     Problems with dizziness:         Gastrointestinal    Blood in stool:     Vomited blood:         Genitourinary    Burning when urinating:     Blood in urine:        Psychiatric    Major depression:         Hematologic    Bleeding problems:    Problems with blood clotting too easily:  Skin    Rashes or ulcers:        Constitutional    Fever or chills:     PHYSICAL EXAM:   Vitals:   01/11/17 1027 01/11/17 1029  BP: (!) 152/67 128/65  Pulse: 68 68  Resp: 14   Temp: 98 F (36.7 C)   SpO2: 92%     Weight: 152 lb (68.9 kg)   Height: 5\' 5"  (1.651 m)     GENERAL: The patient is a well-nourished male, in no acute distress. The vital signs are documented above. CARDIAC: There is a regular rate and rhythm.  VASCULAR: I do not detect carotid bruits. He has palpable femoral pulses which are somewhat prominent. He has a left dorsalis pedis pulse. Both feet are warm and well-perfused. He has no significant lower extremity swelling. PULMONARY: There is good air exchange bilaterally without wheezing or rales. ABDOMEN: Soft and non-tender with normal pitched bowel sounds. His aneurysm is palpable and nontender. MUSCULOSKELETAL: There are no major deformities or cyanosis. NEUROLOGIC: No focal weakness or paresthesias are detected. SKIN: There are no ulcers or rashes noted. PSYCHIATRIC: The patient has a normal affect.  DATA:    DUPLEX ABDOMINAL AORTA: I have independently interpreted his duplex of the abdominal aorta today. The maximum diameter of the infrarenal aorta is 4.9 cm. The maximum diameter of his right common iliac artery is 5.8 cm. The maximum diameter of his left common iliac arteries 5.1 cm.  CAROTID DUPLEX: I have independently interpreted his carotid duplex scan today.  On the right side there is no evidence of recurrent stenosis.  He has a known left internal carotid artery occlusion.  MEDICAL ISSUES:   ABDOMINAL AORTIC ANEURYSM AND BILATERAL COMMON ILIAC ARTERY ANEURYSMS: His abdominal aortic aneurysm is stable in size. His iliac artery aneurysms have increased slightly. Regardless, the patient is a very frail 83 year old and I have discussed surgery including the option of an endovascular approach with the iliac branch device, and neither the patient nor his son wish to consider surgery at this point. I think this is very reasonable given his markedly debilitated state. Fortunately, he is not a smoker. His blood pressure has been under good control.  CAROTID DISEASE:  Patient has a known left internal carotid artery occlusion. His right carotid endarterectomy site is widely patent. I've ordered a follow up duplex in one year. He remains a symptomatically that standpoint. He is on aspirin and is on a statin.  [address] [facility] [phone]

## 2017-01-11 NOTE — Progress Notes (Signed)
Vitals:   01/11/17 1027  BP: (!) 152/67  Pulse: 68  Resp: 14  Temp: 98 F (36.7 C)  SpO2: 92%  Weight: 152 lb (68.9 kg)  Height: 5\' 5"  (1.651 m)

## 2017-01-31 NOTE — Addendum Note (Signed)
Addended by: Burton Apley A on: 01/31/2017 03:19 PM   Modules accepted: Orders

## 2017-02-12 ENCOUNTER — Inpatient Hospital Stay (HOSPITAL_COMMUNITY)
Admission: EM | Admit: 2017-02-12 | Discharge: 2017-02-17 | DRG: 280 | Disposition: A | Discharge status: Skilled Nursing Facility | Payer: Medicare Other | Attending: Internal Medicine | Admitting: Internal Medicine

## 2017-02-12 ENCOUNTER — Encounter (HOSPITAL_COMMUNITY): Payer: Self-pay | Admitting: Emergency Medicine

## 2017-02-12 ENCOUNTER — Other Ambulatory Visit: Payer: Self-pay

## 2017-02-12 ENCOUNTER — Emergency Department (HOSPITAL_COMMUNITY): Payer: Medicare Other

## 2017-02-12 DIAGNOSIS — I480 Paroxysmal atrial fibrillation: Secondary | ICD-10-CM | POA: Diagnosis present

## 2017-02-12 DIAGNOSIS — I5043 Acute on chronic combined systolic (congestive) and diastolic (congestive) heart failure: Secondary | ICD-10-CM | POA: Diagnosis present

## 2017-02-12 DIAGNOSIS — Z952 Presence of prosthetic heart valve: Secondary | ICD-10-CM

## 2017-02-12 DIAGNOSIS — I5042 Chronic combined systolic (congestive) and diastolic (congestive) heart failure: Secondary | ICD-10-CM | POA: Diagnosis not present

## 2017-02-12 DIAGNOSIS — I25709 Atherosclerosis of coronary artery bypass graft(s), unspecified, with unspecified angina pectoris: Secondary | ICD-10-CM | POA: Diagnosis present

## 2017-02-12 DIAGNOSIS — E871 Hypo-osmolality and hyponatremia: Secondary | ICD-10-CM

## 2017-02-12 DIAGNOSIS — I2510 Atherosclerotic heart disease of native coronary artery without angina pectoris: Secondary | ICD-10-CM | POA: Diagnosis present

## 2017-02-12 DIAGNOSIS — I214 Non-ST elevation (NSTEMI) myocardial infarction: Principal | ICD-10-CM | POA: Diagnosis present

## 2017-02-12 DIAGNOSIS — D649 Anemia, unspecified: Secondary | ICD-10-CM | POA: Diagnosis present

## 2017-02-12 DIAGNOSIS — I252 Old myocardial infarction: Secondary | ICD-10-CM | POA: Diagnosis not present

## 2017-02-12 DIAGNOSIS — Z23 Encounter for immunization: Secondary | ICD-10-CM

## 2017-02-12 DIAGNOSIS — I255 Ischemic cardiomyopathy: Secondary | ICD-10-CM | POA: Diagnosis present

## 2017-02-12 DIAGNOSIS — E785 Hyperlipidemia, unspecified: Secondary | ICD-10-CM | POA: Diagnosis present

## 2017-02-12 DIAGNOSIS — I340 Nonrheumatic mitral (valve) insufficiency: Secondary | ICD-10-CM | POA: Diagnosis not present

## 2017-02-12 DIAGNOSIS — R531 Weakness: Secondary | ICD-10-CM

## 2017-02-12 DIAGNOSIS — H9190 Unspecified hearing loss, unspecified ear: Secondary | ICD-10-CM | POA: Diagnosis present

## 2017-02-12 DIAGNOSIS — I110 Hypertensive heart disease with heart failure: Secondary | ICD-10-CM | POA: Diagnosis present

## 2017-02-12 DIAGNOSIS — I350 Nonrheumatic aortic (valve) stenosis: Secondary | ICD-10-CM | POA: Diagnosis present

## 2017-02-12 DIAGNOSIS — Z881 Allergy status to other antibiotic agents status: Secondary | ICD-10-CM

## 2017-02-12 HISTORY — DX: Unspecified hearing loss, unspecified ear: H91.90

## 2017-02-12 LAB — RAPID URINE DRUG SCREEN, HOSP PERFORMED
Amphetamines: NOT DETECTED
BARBITURATES: NOT DETECTED
BENZODIAZEPINES: NOT DETECTED
COCAINE: NOT DETECTED
Opiates: NOT DETECTED
TETRAHYDROCANNABINOL: NOT DETECTED

## 2017-02-12 LAB — CBC
HCT: 36.4 % — ABNORMAL LOW (ref 39.0–52.0)
Hemoglobin: 11.1 g/dL — ABNORMAL LOW (ref 13.0–17.0)
MCH: 27.3 pg (ref 26.0–34.0)
MCHC: 30.5 g/dL (ref 30.0–36.0)
MCV: 89.4 fL (ref 78.0–100.0)
PLATELETS: 139 10*3/uL — AB (ref 150–400)
RBC: 4.07 MIL/uL — ABNORMAL LOW (ref 4.22–5.81)
RDW: 15.9 % — ABNORMAL HIGH (ref 11.5–15.5)
WBC: 9.9 10*3/uL (ref 4.0–10.5)

## 2017-02-12 LAB — BASIC METABOLIC PANEL
Anion gap: 10 (ref 5–15)
BUN: 24 mg/dL — AB (ref 6–20)
CHLORIDE: 99 mmol/L — AB (ref 101–111)
CO2: 24 mmol/L (ref 22–32)
CREATININE: 1.03 mg/dL (ref 0.61–1.24)
Calcium: 8.5 mg/dL — ABNORMAL LOW (ref 8.9–10.3)
GFR calc non Af Amer: >60 mL/min (ref >60)
GLUCOSE: 131 mg/dL — AB (ref 65–99)
Potassium: 4 mmol/L (ref 3.5–5.1)
Sodium: 133 mmol/L — ABNORMAL LOW (ref 135–145)

## 2017-02-12 LAB — I-STAT TROPONIN, ED: Troponin i, poc: 2.65 ng/mL (ref 0.00–0.08)

## 2017-02-12 LAB — HEPATIC FUNCTION PANEL
ALT: 13 U/L — AB (ref 17–63)
AST: 14 U/L — AB (ref 15–41)
Albumin: 3 g/dL — ABNORMAL LOW (ref 3.5–5.0)
Alkaline Phosphatase: 154 U/L — ABNORMAL HIGH (ref 38–126)
TOTAL PROTEIN: 7.7 g/dL (ref 6.5–8.1)
Total Bilirubin: 0.3 mg/dL (ref 0.3–1.2)

## 2017-02-12 LAB — URINALYSIS, ROUTINE W REFLEX MICROSCOPIC
Bacteria, UA: NONE SEEN
Bilirubin Urine: NEGATIVE
GLUCOSE, UA: NEGATIVE mg/dL
Ketones, ur: NEGATIVE mg/dL
Leukocytes, UA: NEGATIVE
Nitrite: NEGATIVE
PROTEIN: 100 mg/dL — AB
SQUAMOUS EPITHELIAL / LPF: NONE SEEN
Specific Gravity, Urine: 1.006 (ref 1.005–1.030)
pH: 8 (ref 5.0–8.0)

## 2017-02-12 LAB — ETHANOL: Alcohol, Ethyl (B): <10 mg/dL (ref <10)

## 2017-02-12 LAB — HEPARIN LEVEL (UNFRACTIONATED): Heparin Unfractionated: 0.27 IU/mL — ABNORMAL LOW (ref 0.30–0.70)

## 2017-02-12 MED ORDER — SIMVASTATIN 20 MG PO TABS
20.0000 mg | ORAL_TABLET | Freq: Every evening | ORAL | Status: DC
  Administered 2017-02-12 – 2017-02-16 (×5): 20 mg via ORAL
  Filled 2017-02-12 (×5): qty 1

## 2017-02-12 MED ORDER — SODIUM CHLORIDE 0.9 % IV BOLUS (SEPSIS)
1000.0000 mL | Freq: Once | INTRAVENOUS | Status: AC
  Administered 2017-02-12: 1000 mL via INTRAVENOUS

## 2017-02-12 MED ORDER — LEVOTHYROXINE SODIUM 50 MCG PO TABS
50.0000 ug | ORAL_TABLET | Freq: Every day | ORAL | Status: DC
  Administered 2017-02-13 – 2017-02-17 (×5): 50 ug via ORAL
  Filled 2017-02-12 (×5): qty 1

## 2017-02-12 MED ORDER — ASPIRIN EC 81 MG PO TBEC
81.0000 mg | DELAYED_RELEASE_TABLET | Freq: Every day | ORAL | Status: DC
  Administered 2017-02-13 – 2017-02-17 (×5): 81 mg via ORAL
  Filled 2017-02-12 (×6): qty 1

## 2017-02-12 MED ORDER — SODIUM CHLORIDE 0.9% FLUSH: 3.0000 mL | INTRAVENOUS | Status: DC | PRN

## 2017-02-12 MED ORDER — FLUTICASONE PROPIONATE 50 MCG/ACT NA SUSP
1.0000 | Freq: Every day | NASAL | Status: DC
  Administered 2017-02-15 – 2017-02-17 (×2): 1 via NASAL
  Filled 2017-02-12: qty 16

## 2017-02-12 MED ORDER — PREDNISONE 5 MG PO TABS
5.0000 mg | ORAL_TABLET | Freq: Every day | ORAL | Status: DC
  Administered 2017-02-13 – 2017-02-17 (×5): 5 mg via ORAL
  Filled 2017-02-12 (×5): qty 1

## 2017-02-12 MED ORDER — METHOTREXATE 2.5 MG PO TABS
25.0000 mg | ORAL_TABLET | ORAL | Status: DC
  Administered 2017-02-17: 25 mg via ORAL
  Filled 2017-02-12: qty 10

## 2017-02-12 MED ORDER — HEPARIN (PORCINE) IN NACL 100-0.45 UNIT/ML-% IJ SOLN
1000.0000 [IU]/h | INTRAMUSCULAR | Status: DC
  Administered 2017-02-12: 900 [IU]/h via INTRAVENOUS
  Administered 2017-02-13: 1000 [IU]/h via INTRAVENOUS
  Filled 2017-02-12 (×2): qty 250

## 2017-02-12 MED ORDER — ACETAMINOPHEN 325 MG PO TABS
650.0000 mg | ORAL_TABLET | Freq: Four times a day (QID) | ORAL | Status: DC | PRN
  Administered 2017-02-13: 650 mg via ORAL
  Filled 2017-02-12: qty 2

## 2017-02-12 MED ORDER — BOOST PO LIQD
237.0000 mL | ORAL | Status: DC
  Administered 2017-02-13 – 2017-02-16 (×4): 237 mL via ORAL
  Filled 2017-02-12 (×6): qty 237

## 2017-02-12 MED ORDER — PANTOPRAZOLE SODIUM 40 MG PO TBEC
40.0000 mg | DELAYED_RELEASE_TABLET | Freq: Every day | ORAL | Status: DC
  Administered 2017-02-13 – 2017-02-17 (×5): 40 mg via ORAL
  Filled 2017-02-12 (×5): qty 1

## 2017-02-12 MED ORDER — CARVEDILOL 6.25 MG PO TABS
6.2500 mg | ORAL_TABLET | Freq: Two times a day (BID) | ORAL | Status: DC
  Administered 2017-02-13 – 2017-02-17 (×10): 6.25 mg via ORAL
  Filled 2017-02-12 (×11): qty 1

## 2017-02-12 MED ORDER — ASPIRIN 81 MG PO CHEW
324.0000 mg | CHEWABLE_TABLET | Freq: Once | ORAL | Status: AC
  Administered 2017-02-12: 324 mg via ORAL
  Filled 2017-02-12: qty 4

## 2017-02-12 MED ORDER — MELATONIN 3 MG PO TABS
3.0000 mg | ORAL_TABLET | Freq: Every day | ORAL | Status: DC
  Administered 2017-02-13 – 2017-02-15 (×3): 3 mg via ORAL
  Filled 2017-02-12 (×6): qty 1

## 2017-02-12 MED ORDER — AMLODIPINE BESYLATE 5 MG PO TABS
5.0000 mg | ORAL_TABLET | Freq: Every day | ORAL | Status: DC
  Administered 2017-02-13 – 2017-02-17 (×5): 5 mg via ORAL
  Filled 2017-02-12 (×5): qty 1

## 2017-02-12 MED ORDER — SODIUM CHLORIDE 0.9 % IV SOLN
250.0000 mL | INTRAVENOUS | Status: DC | PRN
Start: 2017-02-12 — End: 2017-02-17

## 2017-02-12 MED ORDER — HEPARIN BOLUS VIA INFUSION
4000.0000 [IU] | Freq: Once | INTRAVENOUS | Status: AC
  Administered 2017-02-12: 4000 [IU] via INTRAVENOUS
  Filled 2017-02-12: qty 4000

## 2017-02-12 MED ORDER — SODIUM CHLORIDE 0.9% FLUSH
3.0000 mL | Freq: Two times a day (BID) | INTRAVENOUS | Status: DC
  Administered 2017-02-15 – 2017-02-16 (×2): 3 mL via INTRAVENOUS

## 2017-02-12 MED ORDER — FOLIC ACID 1 MG PO TABS
3.0000 mg | ORAL_TABLET | Freq: Every day | ORAL | Status: DC
  Administered 2017-02-13 – 2017-02-17 (×5): 3 mg via ORAL
  Filled 2017-02-12 (×5): qty 3

## 2017-02-12 MED ORDER — NITROGLYCERIN 0.4 MG SL SUBL: 0.4000 mg | SUBLINGUAL_TABLET | SUBLINGUAL | Status: DC | PRN

## 2017-02-12 MED ORDER — LORATADINE 10 MG PO TABS
10.0000 mg | ORAL_TABLET | Freq: Every day | ORAL | Status: DC
  Administered 2017-02-13 – 2017-02-17 (×5): 10 mg via ORAL
  Filled 2017-02-12 (×5): qty 1

## 2017-02-12 NOTE — ED Notes (Signed)
Heparin verified with [place] Ouch

## 2017-02-12 NOTE — ED Provider Notes (Signed)
[facility] EMERGENCY DEPARTMENT Provider Note   CSN: [id number] Arrival date & time: 02/12/17  1032     History   Chief Complaint Chief Complaint  Patient presents with  . Numbness  . Weakness    HPI Hunter Choi is a 83 y.o. male.  HPI   Hunter Choi is a 83 y.o. male, with a history of AAA, left eye blindness, CAD, diastolic heart failure, GERD, PAF, and HTN, presenting to the ED with weakness and numbness.  Last normal was last night before bed.  Patient woke up around 7 AM this morning with right-sided hand and arm numbness.  This is since improved. His weakness is generalized.  States he is typically able to ambulate with his walker to the mailbox, however, today he was too weak to do so.  Denies facial numbness or weakness, headache, falls/trauma, fever/chills, neck/back pain, chest pain, shortness of breath, abdominal pain, extremity pain, urinary abnormalities, dizziness, changes in bowel or bladder function, or any other complaints.     Past Medical History:  Diagnosis Date  . AAA (abdominal aortic aneurysm) (HCC)    Dr. Ashley Royalty  . Aortic stenosis, severe    a. s/p TAVR  . Blind left eye   . BPH (benign prostatic hypertrophy)    Dr. Retta Diones  . CAD (coronary artery disease) of bypass graft    Chronic occlusion of saphenous vein graft to RCA  . Coronary artery disease    Left main disease and severe 3-vessel CAD  . Diastolic heart failure (HCC)   . Embolism involving retinal artery   . GERD (gastroesophageal reflux disease)   . Hard of hearing   . Hypertension   . Hypothyroidism   . Iliac artery aneurysm (HCC)    Dr. Edilia Bo  . PAF (paroxysmal atrial fibrillation) (HCC)   . 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 12/2012: mm Stephannie Peters XT transcatheter heart valve placed via transapical approach  . Stroke [facility])    mini stroke effective his eye left    Patient Active Problem List   Diagnosis  Date Noted  . NSTEMI (non-ST elevated myocardial infarction) (HCC) 02/12/2017  . Sepsis due to urinary tract infection (HCC) 10/02/2015  . Hard of hearing   . Pressure ulcer 01/28/2015  . Chronic cholecystitis 01/27/2015  . Chronic combined systolic and diastolic congestive heart failure (HCC) 01/27/2015  . Elevated LFTs 11/14/2014  . Anemia 05/23/2014  . Microscopic hematuria 05/23/2014  . Thrombocytopenia (HCC) 02/21/2014  . Malnutrition of moderate degree (HCC) 02/21/2014  . Occlusion and stenosis of carotid artery without mention of cerebral infarction 09/04/2013  . Aftercare following surgery of the circulatory system, NEC 09/04/2013  . AAA (abdominal aortic aneurysm) without rupture (HCC) 03/06/2013  . 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 Coronary artery disease involving coronary bypass graft of native heart with angina pectoris (HCC)   . Hypertensive heart disease with CHF (HCC)   . GERD (gastroesophageal reflux disease)   . Thyroid disease   . Embolism involving retinal artery   . BPH (benign prostatic hypertrophy)   . Iliac artery aneurysm (HCC)   . Rheumatoid arthritis (HCC) 04/22/2010  . PULMONARY FUNCTION TESTS, ABNORMAL 04/22/2010    Past Surgical History:  Procedure Laterality Date  . BIOPSY PROSTATE  2005  . BUNIONECTOMY WITH HAMMERTOE RECONSTRUCTION Bilateral   . CARDIAC CATHETERIZATION    . CAROTID ENDARTERECTOMY Right Jan. 2, 1997  . CATARACT EXTRACTION W/ INTRAOCULAR  LENS IMPLANT Right   . COLONOSCOPY    . CORONARY ARTERY BYPASS GRAFT  01/1998   Dr. Sheliah Plane; LIMA to LAD, SVG to D1, SVG to LCx, SVG to RCA, open saphenous vein harvest via right lower extremity  . HARVEST BONE GRAFT  1982   FOR THE  ANKLE  . HEMIARTHROPLASTY HIP Left    AFTER HIP FX  . INTRAOPERATIVE TRANSESOPHAGEAL ECHOCARDIOGRAM N/A 01/15/2013   Performed by [name], MD at [facility] OR  . JOINT REPLACEMENT    . LAPAROSCOPIC CHOLECYSTECTOMY WITH  INTRAOPERATIVE CHOLANGIOGRAM N/A 01/28/2015   Performed by Gaynelle Adu, MD at [facility] OR  . LEFT AND RIGHT HEART CATHETERIZATION WITH CORONARY ANGIOGRAM N/A 12/20/2012   Performed by Lyn Records, MD at [facility] CATH LAB  . Right Heart Cath and Coronary/Graft Angiography N/A 05/07/2015   Performed by Lyn Records, MD at [facility] INVASIVE CV LAB  . SKIN GRAFTS  1968  . TOTAL HIP ARTHROPLASTY Left 2007  . TOTAL KNEE ARTHROPLASTY  1992-2002   right-left  . TRANSCATHETER AORTIC VALVE REPLACEMENT, TRANSAPICAL N/A 01/15/2013   Performed by [name], MD at [facility] OR  . TRANSURETHRAL RESECTION OF PROSTATE  2012       Home Medications    Prior to Admission medications   Medication Sig Start Date End Date Taking? Authorizing Provider  acetaminophen (TYLENOL) 325 MG tablet Take 2 tablets (650 mg total) by mouth every 6 (six) hours as needed for mild pain, moderate pain, fever or headache. 02/03/15  Yes Hongalgi, Maximino Greenland, MD  amLODipine (NORVASC) 5 MG tablet Take 5 mg by mouth daily.   Yes [provider]  aspirin EC 81 MG EC tablet Take 1 tablet (81 mg total) by mouth daily. 01/21/13  Yes Doree Fudge M, PA-C  carvedilol (COREG) 6.25 MG tablet Take 1 tablet (6.25 mg total) by mouth 2 (two) times daily with a meal. 10/21/15  Yes Lyn Records, MD  fluticasone [facility]) 50 MCG/ACT nasal spray Place 1 spray into both nostrils daily.  06/29/16  Yes [provider]  folic acid (FOLVITE) 1 MG tablet Take 3 mg by mouth daily.   Yes [provider]  lactose free nutrition (BOOST) LIQD Take 237 mLs daily by mouth.   Yes [provider]  levothyroxine (SYNTHROID, LEVOTHROID) 50 MCG tablet Take 50 mcg by mouth daily. 11/06/14  Yes [provider]  loratadine (CLARITIN) 10 MG tablet Take 10 mg by mouth daily. 06/29/16  Yes [provider]  MELATONIN PO Take 1 tablet by mouth daily.   Yes [provider]  methotrexate 2.5 MG tablet Take 25 mg by mouth every  Friday.    Yes [provider]  omeprazole (PRILOSEC) 20 MG capsule Take 20 mg by mouth daily.   Yes [provider]  predniSONE (DELTASONE) 5 MG tablet Take 5 mg by mouth daily. 04/07/15  Yes [provider]  simvastatin (ZOCOR) 20 MG tablet Take 20 mg by mouth every evening. 12/20/14  Yes [provider]  nitroGLYCERIN (NITROSTAT) 0.4 MG SL tablet PLACE 1 TABLET (0.4 MG TOTAL) UNDER THE TONGUE EVERY 5 MINUTES AS NEEDED FOR CHEST PAIN 12/21/16   Lyn Records, MD  ondansetron (ZOFRAN ODT) 4 MG disintegrating tablet Take 1 tablet (4 mg total) by mouth every 8 (eight) hours as needed for nausea or vomiting. Patient not taking: Reported on 01/11/2017 11/15/16   Ward, Layla Maw, DO  traMADol (ULTRAM) 50 MG tablet Take  1 tablet (50 mg total) by mouth every 8 (eight) hours as needed. Patient not taking: Reported on 01/11/2017 11/15/16   Ward, Layla Maw, DO    Family History Family History  Problem Relation Age of Onset  . Heart attack Mother   . Heart disease Mother        Before age 56  . Hyperlipidemia Mother   . Hypertension Mother   . Hypertension Son   . Heart disease Father        AAA-   . Hyperlipidemia Father   . Heart attack Father   . Hypertension Father   . Heart disease Brother        Before age 80  . Hyperlipidemia Brother   . Heart attack Brother   . Hypertension Brother     Social History Social History   Tobacco Use  . Smoking status: Former Smoker    Packs/day: 0.50    Years: 47.00    Pack years: 23.50    Types: Cigarettes    Last attempt to quit: 03/28/1958    Years since quitting: 58.9  . Smokeless tobacco: Former Neurosurgeon    Types: Chew  . Tobacco comment: ONLY USED 2 YEARS  Substance Use Topics  . Alcohol use: No  . Drug use: No     Allergies   Ciprofloxacin   Review of Systems Review of Systems  Constitutional: Negative for chills, diaphoresis and fever.  Respiratory: Negative for shortness of breath.     Cardiovascular: Negative for chest pain.  Gastrointestinal: Negative for abdominal pain, diarrhea, nausea and vomiting.  Neurological: Positive for weakness (generalized) and numbness (right arm). Negative for dizziness, light-headedness and headaches.  All other systems reviewed and are negative.    Physical Exam Updated Vital Signs BP (!) 142/56   Pulse 62   Temp 98.1 F (36.7 C) (Oral)   Resp 18   SpO2 97%   Physical Exam  Constitutional: He is oriented to person, place, and time. He appears well-developed and well-nourished. No distress.  HENT:  Head: Normocephalic and atraumatic.  Eyes: Conjunctivae and EOM are normal.  Patient with blindness in left eye at baseline  Neck: Neck supple.  Cardiovascular: Normal rate, regular rhythm, normal heart sounds and intact distal pulses.  Pulmonary/Chest: Effort normal and breath sounds normal. No respiratory distress.  Abdominal: Soft. There is no tenderness. There is no guarding.  Musculoskeletal: He exhibits no edema.  Lymphadenopathy:    He has no cervical adenopathy.  Neurological: He is alert and oriented to person, place, and time.  Patient indicates decreased sensation to light touch in the right arm. No noted speech deficits. No aphasia. Patient handles oral secretions without difficulty. No noted swallowing defects.  Equal grip strength bilaterally. Strength 5/5 in the upper extremities. Strength 5/5 in bilateral hips, knees, and ankles.  Attempted to stand patient at the bedside.  Patient's legs would not support his weight enough to allow me to let go of the patient. Coordination intact. Cranial nerves III-XII grossly intact.  No facial droop.   Skin: Skin is warm and dry. He is not diaphoretic.  Psychiatric: He has a normal mood and affect. His behavior is normal.  Nursing note and vitals reviewed.    ED Treatments / Results  Labs (all labs ordered are listed, but only abnormal results are displayed) Labs  Reviewed  BASIC METABOLIC PANEL - Abnormal; Notable for the following components:      Result Value   Sodium 133 (*)  Chloride 99 (*)    Glucose, Bld 131 (*)    BUN 24 (*)    Calcium 8.5 (*)    All other components within normal limits  CBC - Abnormal; Notable for the following components:   RBC 4.07 (*)    Hemoglobin 11.1 (*)    HCT 36.4 (*)    RDW 15.9 (*)    Platelets 139 (*)    All other components within normal limits  HEPATIC FUNCTION PANEL - Abnormal; Notable for the following components:   Albumin 3.0 (*)    AST 14 (*)    ALT 13 (*)    Alkaline Phosphatase 154 (*)    Bilirubin, Direct <0.1 (*)    All other components within normal limits  I-STAT TROPONIN, ED - Abnormal; Notable for the following components:   Troponin i, poc 2.65 (*)    All other components within normal limits  ETHANOL  URINALYSIS, ROUTINE W REFLEX MICROSCOPIC  RAPID URINE DRUG SCREEN, HOSP PERFORMED  HEPARIN LEVEL (UNFRACTIONATED)  HEPARIN LEVEL (UNFRACTIONATED)  CBC  CBG MONITORING, ED    EKG  EKG Interpretation  Date/Time:  Sunday February 12 2017 10:48:13 EST Ventricular Rate:  62 PR Interval:    QRS Duration: 176 QT Interval:  443 QTC Calculation: 450 R Axis:   -35 Text Interpretation:  Sinus rhythm Prolonged PR interval IVCD, consider atypical RBBB LVH with secondary repolarization abnormality no significant change since 2017 Confirmed by Pricilla Loveless [phone]) on 02/12/2017 10:51:52 AM       EKG Interpretation  Date/Time:  Sunday February 12 2017 14:20:38 EST Ventricular Rate:  62 PR Interval:    QRS Duration: 173 QT Interval:  459 QTC Calculation: 467 R Axis:   -142 Text Interpretation:  Sinus or ectopic atrial rhythm Prolonged PR interval IVCD, consider atypical RBBB Consider left ventricular hypertrophy Abnormal T, consider ischemia, lateral leads I, AVL likely have misplaced leads Confirmed by Pricilla Loveless [phone]) on 02/12/2017 3:48:18 PM       Radiology Dg  Chest 2 View  Result Date: 02/12/2017 CLINICAL DATA:  Pt tried to ambulate out to his mailbox but could not due to general weakness PTA with Numbness to right hand and generalized weakness EXAM: CHEST - 2 VIEW COMPARISON:  10/02/2015 FINDINGS: Low lung volumes with resultant crowding of perihilar and bibasilar bronchovascular markings. Mild interstitial prominence bilaterally. No confluent airspace disease. Previous median sternotomy, CABG, AVR. Stable cardiomegaly. Atheromatous aorta. No effusion.  No pneumothorax. Surgical clips above the thoracic inlet on the right. Cholecystectomy clips. IMPRESSION: 1. Mild bilateral interstitial prominence, may be secondary to low lung volumes. 2. Stable cardiomegaly and postop change. Electronically Signed   By: Corlis Leak M.D.   On: 02/12/2017 12:01   Ct Head Wo Contrast  Result Date: 02/12/2017 CLINICAL DATA:  Right hand numbness.  Generalized weakness. EXAM: CT HEAD WITHOUT CONTRAST TECHNIQUE: Contiguous axial images were obtained from the base of the skull through the vertex without intravenous contrast. COMPARISON:  None. FINDINGS: Brain: No acute intracranial abnormality. Specifically, no hemorrhage, hydrocephalus, mass lesion, acute infarction, or significant intracranial injury. 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 There is atrophy and chronic small vessel disease changes. Vascular: No hyperdense vessel or unexpected calcification. Skull: No acute calvarial abnormality. Sinuses/Orbits: Visualized paranasal sinuses and mastoids clear. Orbital soft tissues unremarkable. Other: None IMPRESSION: Atrophy, chronic small vessel disease. 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 No acute intracranial abnormality. Electronically Signed   By: Charlett Nose M.D.   On:  02/12/2017 12:40    Procedures Procedures (including critical care time)  Medications Ordered in ED Medications  heparin ADULT infusion 100 units/mL (25000 units/25150mL sodium chloride  0.45%) (900 Units/hr Intravenous New Bag/Given 02/12/17 1456)  sodium chloride 0.9 % bolus 1,000 mL (0 mLs Intravenous Stopped 02/12/17 1710)  aspirin chewable tablet 324 mg (324 mg Oral Given 02/12/17 1455)  heparin bolus via infusion 4,000 Units (4,000 Units Intravenous Bolus from Bag 02/12/17 1504)     Initial Impression / Assessment and Plan / ED Course  I have reviewed the triage vital signs and the nursing notes.  Pertinent labs & imaging results that were available during my care of the patient were reviewed by me and considered in my medical decision making (see chart for details).  Clinical Course as of Feb 12 1722  Wynelle LinkSun Feb 12, 2017  1402 Spoke with Dr. Rennis GoldenHilty, cardiologist. States he will come evaluate the patient personally.   [SJ]  1604 Followed up with Dr. Rennis GoldenHilty. States his service will admit the patient.  [SJ]    Clinical Course User Index [SJ] Himmat Enberg C, PA-C    Patient presents with generalized weakness.  Troponin positive.  No EKG changes over ED course.  Other labs are reassuring.  Admission via cardiology.    Findings and plan of care discussed with Pricilla LovelessScott Goldston, MD. Dr. Criss AlvineGoldston personally evaluated and examined this patient.  Vitals:   02/12/17 1615 02/12/17 1630 02/12/17 1649 02/12/17 1700  BP: (!) 151/74  (!) 147/81 (!) 153/82  Pulse: 60 91 63 61  Resp: 12 (!) 21 13 (!) 24  Temp:      TempSrc:      SpO2: 98% 98% 97% 97%  Weight:      Height:         Final Clinical Impressions(s) / ED Diagnoses   Final diagnoses:  Weakness    ED Discharge Orders    None       Concepcion LivingJoy, Lonette Stevison C, PA-C 02/12/17 1724    Pricilla LovelessGoldston, Scott, MD 02/12/17 1755

## 2017-02-12 NOTE — Progress Notes (Signed)
ANTICOAGULATION CONSULT NOTE - Follow-Up Consult  Pharmacy Consult for heparin Indication: chest pain/ACS  Patient Measurements: Height: 5\' 7"  (170.2 cm) Weight: 153 lb 6.4 oz (69.6 kg) IBW/kg (Calculated) : 66.1 Heparin Dosing Weight: 68.9kg  Vital Signs: Temp: 98 F (36.7 C) (11/18 2002) Temp Source: Oral (11/18 2002) BP: 149/63 (11/18 2002) Pulse Rate: 64 (11/18 2002)  Labs: Recent Labs    02/12/17 1046 02/12/17 2100  HGB 11.1*  --   HCT 36.4*  --   PLT 139*  --   HEPARINUNFRC  --  0.27*  CREATININE 1.03  --     Estimated Creatinine Clearance: 47.2 mL/min (by C-G formula based on SCr of 1.03 mg/dL).   Medications:  Infusions:  . sodium chloride    . heparin 900 Units/hr (02/12/17 1456)    Assessment: 89 yom presented to the ED with right hand numbness and generalized weakness. Troponin found to be elevated and now starting IV heparin. Baseline H/H and platelets are slightly low. He is not on anticoagulation PTA. Per discussion with provider, does not think this is an acute stroke at this time and ok with continuing standard ACS dosing of heparin.   Heparin level this evening resulted as SUBtherapeutic (HL 0.27, goal of 0.3-0.7) however was drawn 2 hours early.    Goal of Therapy:  Heparin level 0.3-0.7 units/ml Monitor platelets by anticoagulation protocol: Yes   Plan:  1. Increase Heparin to 1000 units/hr (10 ml/hr) 2. Will continue to monitor for any signs/symptoms of bleeding and will follow up with heparin level in 8 hours   Thank you for allowing pharmacy to be a part of this patient's care.  02/14/17, PharmD, BCPS Clinical Pharmacist Pager: [phone] If after 3:30p, please call main pharmacy at: [phone] 02/12/2017 9:49 PM

## 2017-02-12 NOTE — H&P (Signed)
ADMISSION HISTORY & PHYSICAL  Patient Name: Hunter Choi Date of Encounter: 02/12/2017 Primary Care Physician: Birdie Sons, MD (Inactive) Cardiologist: Dr. Tamala Julian  Chief Complaint   Weakness  Patient Profile    83 year old male patient status post TAVR, with known coronary artery disease and bypass graft occlusion, severe hearing loss and new systolic congestive heart failure with EF less than 30%, presents with weakness and found to have an elevated troponin of 2.65.  HPI   This is a 83 y.o. 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 He now presents with weakness which is been present for a day or 2. He denies any chest pain. He reports no worsening shortness of breath. On admission a troponin was checked and elevated at 2.65. EKG shows sinus rhythm at 62, IVCD with a left bundle branch pattern, LVH and lateral T-wave inversion suggestive of possible ischemia. Findings are similar to his EKG in April 2018. When he saw Dr. Tamala Julian last at that time in the office, his most recent LVEF had dropped to less than 30%. He was clinically stable without anginal symptoms and no medication changes were made.   PMHx   Past Medical History:  Diagnosis Date  . AAA (abdominal aortic aneurysm) ([place])    Dr. Rosalia Hammers  . Aortic stenosis, severe    a. s/p TAVR  . Blind left eye   . BPH (benign prostatic hypertrophy)    Dr. Diona Fanti  . CAD (coronary artery disease) of bypass graft    Chronic occlusion of saphenous vein graft to RCA  . Coronary artery disease    Left main disease and severe 3-vessel CAD   . Diastolic heart failure ([place])   . Embolism involving retinal artery   . GERD (gastroesophageal reflux disease)   . Hard of hearing   . Hypertension   . Hypothyroidism   . Iliac artery aneurysm ([facility])    Dr. Scot Dock  . PAF (paroxysmal atrial fibrillation) ([place])   . 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 12/2012: mm Berniece Pap XT transcatheter heart valve placed via transapical approach  . Stroke [facility])    mini stroke effective his eye left    Past Surgical History:  Procedure Laterality Date  . BIOPSY PROSTATE  2005  . BUNIONECTOMY WITH HAMMERTOE RECONSTRUCTION Bilateral   . CARDIAC CATHETERIZATION    . CAROTID ENDARTERECTOMY Right Jan. 2, 1997  . CATARACT EXTRACTION W/ INTRAOCULAR LENS IMPLANT Right   . COLONOSCOPY    . CORONARY ARTERY BYPASS GRAFT  01/1998   Dr. Lanelle Bal; LIMA to LAD, SVG to D1, SVG to LCx, SVG to RCA, open saphenous vein harvest via right lower extremity  . HARVEST BONE GRAFT  1982   FOR THE  ANKLE  . HEMIARTHROPLASTY HIP Left    AFTER HIP FX  . INTRAOPERATIVE TRANSESOPHAGEAL ECHOCARDIOGRAM N/A 01/15/2013   Performed by Gaye Pollack, MD at [place]    . LAPAROSCOPIC CHOLECYSTECTOMY WITH INTRAOPERATIVE CHOLANGIOGRAM N/A 01/28/2015   Performed by Greer Pickerel, MD at [place]  . LEFT AND RIGHT HEART CATHETERIZATION WITH CORONARY ANGIOGRAM N/A 12/20/2012   Performed by  Belva Crome, MD at [facility] CATH LAB  . Right Heart Cath and Coronary/Graft Angiography N/A 05/07/2015   Performed by Belva Crome, MD at [place] CV LAB  . SKIN GRAFTS  1968  . TOTAL HIP ARTHROPLASTY Left 2007  . TOTAL KNEE ARTHROPLASTY  1992-2002   right-left  . TRANSCATHETER AORTIC VALVE REPLACEMENT, TRANSAPICAL N/A 01/15/2013   Performed by Gaye Pollack, MD at [place]  . TRANSURETHRAL RESECTION OF PROSTATE  2012    FAMHx   Family History  Problem Relation Age of Onset  . Heart attack Mother   . Heart disease Mother         Before age 59  . Hyperlipidemia Mother   . Hypertension Mother   . Hypertension Son   . Heart disease Father        AAA-   . Hyperlipidemia Father   . Heart attack Father   . Hypertension Father   . Heart disease Brother        Before age 28  . Hyperlipidemia Brother   . Heart attack Brother   . Hypertension Brother     SOCHx    reports that he quit smoking about 58 years ago. His smoking use included cigarettes. He has a 23.50 pack-year smoking history. He has quit using smokeless tobacco. His smokeless tobacco use included chew. He reports that he does not drink alcohol or use drugs.  Outpatient Medications   No current facility-administered medications on file prior to encounter.    Current Outpatient Medications on File Prior to Encounter  Medication Sig Dispense Refill  . acetaminophen (TYLENOL) 325 MG tablet Take 2 tablets (650 mg total) by mouth every 6 (six) hours as needed for mild pain, moderate pain, fever or headache.    Marland Kitchen amLODipine (NORVASC) 5 MG tablet Take 5 mg by mouth daily.    Marland Kitchen aspirin EC 81 MG EC tablet Take 1 tablet (81 mg total) by mouth daily.    . carvedilol (COREG) 6.25 MG tablet Take 1 tablet (6.25 mg total) by mouth 2 (two) times daily with a meal. 60 tablet 3  . fluticasone (FLONASE) 50 MCG/ACT nasal spray Place 1 spray into both nostrils daily.     . folic acid (FOLVITE) 1 MG tablet Take 3 mg by mouth daily.    Marland Kitchen lactose free nutrition (BOOST) LIQD Take 237 mLs daily by mouth.    . levothyroxine (SYNTHROID, LEVOTHROID) 50 MCG tablet Take 50 mcg by mouth daily.  0  . loratadine (CLARITIN) 10 MG tablet Take 10 mg by mouth daily.  1  . MELATONIN PO Take 1 tablet by mouth daily.    . methotrexate 2.5 MG tablet Take 25 mg by mouth every Friday.     Marland Kitchen omeprazole (PRILOSEC) 20 MG capsule Take 20 mg by mouth daily.    . predniSONE (DELTASONE) 5 MG tablet Take 5 mg by mouth daily.  3  . simvastatin (ZOCOR) 20 MG tablet Take 20 mg by mouth every evening.  12   . nitroGLYCERIN (NITROSTAT) 0.4 MG SL tablet PLACE 1 TABLET (0.4 MG TOTAL) UNDER THE TONGUE EVERY 5 MINUTES AS NEEDED FOR CHEST PAIN 25 tablet 5  . ondansetron (ZOFRAN ODT) 4 MG disintegrating tablet Take 1 tablet (4 mg total) by mouth every 8 (eight) hours as needed for nausea or vomiting. (Patient not taking: Reported on 01/11/2017) 20 tablet 0  . traMADol (ULTRAM) 50 MG tablet Take 1 tablet (50 mg total) by mouth every  8 (eight) hours as needed. (Patient not taking: Reported on 01/11/2017) 15 tablet 0    Inpatient Medications    Scheduled Meds:   Continuous Infusions: . heparin 900 Units/hr (02/12/17 1456)    PRN Meds:    ALLERGIES   Allergies  Allergen Reactions  . Ciprofloxacin Other (See Comments)    REACTION: mild delirium    ROS   Pertinent items noted in HPI and remainder of comprehensive ROS otherwise negative.  Vitals   Vitals:   02/12/17 1430 02/12/17 1445 02/12/17 1500 02/12/17 1515  BP: (!) 152/65 (!) 150/68 (!) 160/76 (!) 154/70  Pulse: 60 60 65 62  Resp: _0 Temp:      TempSrc:      SpO2: 97% 97% 98% 99%  Weight:      Height:       No intake or output data in the 24 hours ending 02/12/17 1618 Filed Weights   02/12/17 1300  Weight: 152 lb (68.9 kg)    Physical Exam   General appearance: alert, appears older than stated age, no distress and appears disheveled Neck: no carotid bruit, no JVD and thyroid not enlarged, symmetric, no tenderness/mass/nodules Lungs: diminished breath sounds bilaterally Heart: regular rate and rhythm, S1, S2 normal and systolic murmur: early systolic 2/6, crescendo at 2nd right intercostal space Abdomen: soft, non-tender; bowel sounds normal; no masses,  no organomegaly Extremities: extremities normal, atraumatic, no cyanosis or edema Pulses: 2+ and symmetric Skin: Skin color, texture, turgor normal. No rashes or lesions Neurologic: Mental status: Alert, oriented, thought content appropriate, very hard of  hearing psych: Pleasant  Labs   Results for orders placed or performed during the hospital encounter of 02/12/17 (from the past 48 hour(s))  Basic metabolic panel     Status: Abnormal   Collection Time: 02/12/17 10:46 AM  Result Value Ref Range   Sodium 133 (L) 135 - 145 mmol/L   Potassium 4.0 3.5 - 5.1 mmol/L   Chloride 99 (L) 101 - 111 mmol/L   CO2 24 22 - 32 mmol/L   Glucose, Bld 131 (H) 65 - 99 mg/dL   BUN 24 (H) 6 - 20 mg/dL   Creatinine, Ser 1.03 0.61 - 1.24 mg/dL   Calcium 8.5 (L) 8.9 - 10.3 mg/dL   GFR calc non Af Amer >60 >60 mL/min   GFR calc Af Amer >60 >60 mL/min    Comment: (NOTE) The eGFR has been calculated using the CKD EPI equation. This calculation has not been validated in all clinical situations. eGFR's persistently <60 mL/min signify possible Chronic Kidney Disease.    Anion gap 10 5 - 15  CBC     Status: Abnormal   Collection Time: 02/12/17 10:46 AM  Result Value Ref Range   WBC 9.9 4.0 - 10.5 K/uL   RBC 4.07 (L) 4.22 - 5.81 MIL/uL   Hemoglobin 11.1 (L) 13.0 - 17.0 g/dL   HCT 36.4 (L) 39.0 - 52.0 %   MCV 89.4 78.0 - 100.0 fL   MCH 27.3 26.0 - 34.0 pg   MCHC 30.5 30.0 - 36.0 g/dL   RDW 15.9 (H) 11.5 - 15.5 %   Platelets 139 (L) 150 - 400 K/uL  Hepatic function panel     Status: Abnormal   Collection Time: 02/12/17 12:06 PM  Result Value Ref Range   Total Protein 7.7 6.5 - 8.1 g/dL   Albumin 3.0 (L) 3.5 - 5.0 g/dL   AST 14 (L) 15 - 41 U/L  ALT 13 (L) 17 - 63 U/L   Alkaline Phosphatase 154 (H) 38 - 126 U/L   Total Bilirubin 0.3 0.3 - 1.2 mg/dL   Bilirubin, Direct <0.1 (L) 0.1 - 0.5 mg/dL   Indirect Bilirubin NOT CALCULATED 0.3 - 0.9 mg/dL  Ethanol     Status: None   Collection Time: 02/12/17 12:06 PM  Result Value Ref Range   Alcohol, Ethyl (B) <10 <10 mg/dL    Comment:        LOWEST DETECTABLE LIMIT FOR SERUM ALCOHOL IS 10 mg/dL FOR MEDICAL PURPOSES ONLY   I-stat troponin, ED     Status: Abnormal   Collection Time: 02/12/17  1:11 PM    Result Value Ref Range   Troponin i, poc 2.65 (HH) 0.00 - 0.08 ng/mL   Comment NOTIFIED PHYSICIAN    Comment 3            Comment: Due to the release kinetics of cTnI, a negative result within the first hours of the onset of symptoms does not rule out myocardial infarction with certainty. If myocardial infarction is still suspected, repeat the test at appropriate intervals.     ECG   Sinus rhythm at 62, IVCD/left bundle branch pattern, voltage for LVH, lateral T-wave inversions- Personally Reviewed  Telemetry   Sinus rhythm- Personally Reviewed  Radiology   Dg Chest 2 View  Result Date: 02/12/2017 CLINICAL DATA:  Pt tried to ambulate out to his mailbox but could not due to general weakness PTA with Numbness to right hand and generalized weakness EXAM: CHEST - 2 VIEW COMPARISON:  10/02/2015 FINDINGS: Low lung volumes with resultant crowding of perihilar and bibasilar bronchovascular markings. Mild interstitial prominence bilaterally. No confluent airspace disease. Previous median sternotomy, CABG, AVR. Stable cardiomegaly. Atheromatous aorta. No effusion.  No pneumothorax. Surgical clips above the thoracic inlet on the right. Cholecystectomy clips. IMPRESSION: 1. Mild bilateral interstitial prominence, may be secondary to low lung volumes. 2. Stable cardiomegaly and postop change. Electronically Signed   By: Lucrezia Europe M.D.   On: 02/12/2017 12:01   Ct Head Wo Contrast  Result Date: 02/12/2017 CLINICAL DATA:  Right hand numbness.  Generalized weakness. EXAM: CT HEAD WITHOUT CONTRAST TECHNIQUE: Contiguous axial images were obtained from the base of the skull through the vertex without intravenous contrast. COMPARISON:  None. FINDINGS: Brain: No acute intracranial abnormality. Specifically, no hemorrhage, hydrocephalus, mass lesion, acute infarction, or significant intracranial injury. 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 There is atrophy and chronic small vessel disease  changes. Vascular: No hyperdense vessel or unexpected calcification. Skull: No acute calvarial abnormality. Sinuses/Orbits: Visualized paranasal sinuses and mastoids clear. Orbital soft tissues unremarkable. Other: None IMPRESSION: Atrophy, chronic small vessel disease. 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 No acute intracranial abnormality. Electronically Signed   By: Rolm Baptise M.D.   On: 02/12/2017 12:40    Cardiac Studies   N/A  Assessment   Principal Problem:   NSTEMI (non-ST elevated myocardial infarction) ([place]) Active Problems:   Hypertensive heart disease with CHF ([place])   S/P TAVR (transcatheter aortic valve replacement)   Chronic combined systolic and diastolic congestive heart failure ([place])   Plan   1. Mr. [place] presents with a day or 2 of weakness to which she had difficulty standing. His son says he hates coming to the hospital but requested he be sent in because of his weakness and fatigue. He flatly denies any recent chest pain or worsening shortness of breath, however his troponin is elevated  at 2.65. In 2017 troponin was elevated at 2.80 and declined to 0.81. This could represent a silent MI, or perhaps suggest lost a bypass graft. His last heart catheterization in 2017 showed total occlusion of the SVG to right coronary, diffuse disease in the SVG to obtuse marginal and moderate diffuse disease in the SVG to ramus intermedius, with LVEF 20-25%. There was some discussion about intervention however it was not undertaken at the time. He has been placed on IV heparin. We'll obtain a new echocardiogram. We will trend cardiac enzymes. Can discuss with the interventional service whether repeat cardiac catheterization is of benefit given his new weakness or not.  Time Spent Directly with Patient:  I have spent a total of 45 minutes with patient reviewing hospital notes, telemetry, EKGs, labs and examining the patient as well as establishing an assessment and plan that was  discussed with the patient. > 50% of time was spent in direct patient care.   Length of Stay:  LOS: 0 days   Pixie Casino, MD, [facility], [place] Director of the Advanced Lipid Disorders &  Cardiovascular Risk Reduction Clinic Attending Cardiologist  Direct Dial: [phone]  Fax: [phone]  Website:  [email or web address] 02/12/2017, 4:18 PM

## 2017-02-12 NOTE — Progress Notes (Signed)
ANTICOAGULATION CONSULT NOTE - Initial Consult  Pharmacy Consult for heparin Indication: chest pain/ACS  Allergies  Allergen Reactions  . Ciprofloxacin Other (See Comments)    REACTION: mild delirium    Patient Measurements: Height: 5\' 5"  (165.1 cm) Weight: 152 lb (68.9 kg) IBW/kg (Calculated) : 61.5 Heparin Dosing Weight: 68.9kg  Vital Signs: Temp: 98.1 F (36.7 C) (11/18 1036) Temp Source: Oral (11/18 1036) BP: 142/56 (11/18 1036) Pulse Rate: 62 (11/18 1036)  Labs: Recent Labs    02/12/17 1046  HGB 11.1*  HCT 36.4*  PLT 139*  CREATININE 1.03    Estimated Creatinine Clearance: 44 mL/min (by C-G formula based on SCr of 1.03 mg/dL).   Medical History: Past Medical History:  Diagnosis Date  . AAA (abdominal aortic aneurysm) ([facility])    Dr. 02/14/17  . Aortic stenosis, severe    a. s/p TAVR  . Blind left eye   . BPH (benign prostatic hypertrophy)    Dr. Ashley Royalty  . CAD (coronary artery disease) of bypass graft    Chronic occlusion of saphenous vein graft to RCA  . Coronary artery disease    Left main disease and severe 3-vessel CAD  . Diastolic heart failure ([facility])   . Embolism involving retinal artery   . GERD (gastroesophageal reflux disease)   . Hard of hearing   . Hypertension   . Hypothyroidism   . Iliac artery aneurysm ([facility])    Dr. Retta Diones  . PAF (paroxysmal atrial fibrillation) ([facility])   . 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 12/2012: mm 01/2013 XT transcatheter heart valve placed via transapical approach  . Stroke [facility])    mini stroke effective his eye left    Medications:  Infusions:  . heparin    . sodium chloride      Assessment: 89 yom presented to the ED with right hand numbness and generalized weakness. Troponin found to be elevated and now starting IV heparin. Baseline H/H and platelets are slightly low. He is not on anticoagulation PTA. Per discussion with provider, does not think this is an acute  stroke at this time and ok with continuing standard ACS dosing of heparin.   Goal of Therapy:  Heparin level 0.3-0.7 units/ml Monitor platelets by anticoagulation protocol: Yes   Plan:  Heparin bolus 4000 units IV x 1 Heparin gtt 900 units/hr Check an 8 hr heparin level Daily heparin level and CBC  Neira Bentsen, [facility] 02/12/2017,1:53 PM

## 2017-02-12 NOTE — ED Triage Notes (Signed)
Received pt from home with c/o woke up 2 1/2 hours PTA with Numbness to right hand and generalized weakness. Pt reports numbness to right hand decreased since calling EMS but general weakness remains. Pt normally ambulates with a walker. Pt tried to ambulate out to his mailbox but could not due to general weakness. CBG 193 for EMS.

## 2017-02-13 ENCOUNTER — Other Ambulatory Visit (HOSPITAL_COMMUNITY): Payer: Medicare Other

## 2017-02-13 LAB — CBC
HCT: 35.1 % — ABNORMAL LOW (ref 39.0–52.0)
Hemoglobin: 10.9 g/dL — ABNORMAL LOW (ref 13.0–17.0)
MCH: 27.7 pg (ref 26.0–34.0)
MCHC: 31.1 g/dL (ref 30.0–36.0)
MCV: 89.3 fL (ref 78.0–100.0)
PLATELETS: 152 10*3/uL (ref 150–400)
RBC: 3.93 MIL/uL — ABNORMAL LOW (ref 4.22–5.81)
RDW: 16.1 % — AB (ref 11.5–15.5)
WBC: 10.6 10*3/uL — AB (ref 4.0–10.5)

## 2017-02-13 LAB — TROPONIN I
TROPONIN I: 18.39 ng/mL — AB (ref <0.03)
TROPONIN I: 15 ng/mL — AB (ref <0.03)

## 2017-02-13 LAB — BASIC METABOLIC PANEL
Anion gap: 8 (ref 5–15)
BUN: 15 mg/dL (ref 6–20)
CALCIUM: 8.4 mg/dL — AB (ref 8.9–10.3)
CO2: 23 mmol/L (ref 22–32)
CREATININE: 0.91 mg/dL (ref 0.61–1.24)
Chloride: 99 mmol/L — ABNORMAL LOW (ref 101–111)
GFR calc non Af Amer: >60 mL/min (ref >60)
Glucose, Bld: 85 mg/dL (ref 65–99)
Potassium: 4.4 mmol/L (ref 3.5–5.1)
SODIUM: 130 mmol/L — AB (ref 135–145)

## 2017-02-13 LAB — BRAIN NATRIURETIC PEPTIDE: B Natriuretic Peptide: 1875.3 pg/mL — ABNORMAL HIGH (ref 0.0–100.0)

## 2017-02-13 LAB — HEPARIN LEVEL (UNFRACTIONATED): Heparin Unfractionated: 0.34 IU/mL (ref 0.30–0.70)

## 2017-02-13 MED ORDER — SODIUM CHLORIDE 0.9% FLUSH: 3.0000 mL | INTRAVENOUS | Status: DC | PRN

## 2017-02-13 MED ORDER — SODIUM CHLORIDE 0.9% FLUSH: 3.0000 mL | Freq: Two times a day (BID) | INTRAVENOUS | Status: DC

## 2017-02-13 MED ORDER — ASPIRIN 81 MG PO CHEW
81.0000 mg | CHEWABLE_TABLET | ORAL | Status: AC
  Administered 2017-02-14: 81 mg via ORAL
  Filled 2017-02-13: qty 1

## 2017-02-13 MED ORDER — SODIUM CHLORIDE 0.9 % IV SOLN
INTRAVENOUS | Status: DC
  Administered 2017-02-14: 07:00:00 via INTRAVENOUS

## 2017-02-13 MED ORDER — SODIUM CHLORIDE 0.9 % IV SOLN: 250.0000 mL | INTRAVENOUS | Status: DC | PRN

## 2017-02-13 NOTE — Plan of Care (Signed)
PT. Confusd at times Understands why he is here  Stated  " I am here to have test done to see what is wrong with my heart".

## 2017-02-13 NOTE — Plan of Care (Signed)
  Progressing Activity: Ability to tolerate increased activity will improve 02/13/2017 0243 - Progressing by Olive Bass, RN Cardiac: Ability to achieve and maintain adequate cardiovascular perfusion will improve 02/13/2017 0243 - Progressing by Olive Bass, RN

## 2017-02-13 NOTE — Progress Notes (Signed)
Pt had 20bt NSVT, earlier no s/s, MD notified, will continue to monitor, Thanks Lavonda Jumbo RN

## 2017-02-13 NOTE — Plan of Care (Signed)
  Progressing Education: Understanding of cardiac disease, CV risk reduction, and recovery process will improve 02/13/2017 0853 - Progressing by Quentin Cornwall, RN Activity: Ability to tolerate increased activity will improve 02/13/2017 0853 - Progressing by Quentin Cornwall, RN Cardiac: Ability to achieve and maintain adequate cardiovascular perfusion will improve 02/13/2017 0853 - Progressing by Quentin Cornwall, RN Health Behavior/Discharge Planning: Ability to safely manage health-related needs after discharge will improve 02/13/2017 0853 - Progressing by Quentin Cornwall, RN Education: Knowledge of General Education information will improve 02/13/2017 0853 - Progressing by Quentin Cornwall, RN Health Behavior/Discharge Planning: Ability to manage health-related needs will improve 02/13/2017 0853 - Progressing by Quentin Cornwall, RN Clinical Measurements: Ability to maintain clinical measurements within normal limits will improve 02/13/2017 0853 - Progressing by Quentin Cornwall, RN Will remain free from infection 02/13/2017 0853 - Progressing by Quentin Cornwall, RN Diagnostic test results will improve 02/13/2017 0853 - Progressing by Quentin Cornwall, RN Respiratory complications will improve 02/13/2017 0853 - Progressing by Quentin Cornwall, RN Cardiovascular complication will be avoided 02/13/2017 0853 - Progressing by Quentin Cornwall, RN Activity: Risk for activity intolerance will decrease 02/13/2017 0853 - Progressing by Quentin Cornwall, RN Nutrition: Adequate nutrition will be maintained 02/13/2017 0853 - Progressing by Quentin Cornwall, RN Coping: Level of anxiety will decrease 02/13/2017 0853 - Progressing by Quentin Cornwall, RN Elimination: Will not experience complications related to bowel motility 02/13/2017 0853 - Progressing by Quentin Cornwall, RN Will not experience complications related to urinary retention 02/13/2017 0853 - Progressing by Quentin Cornwall,  RN Pain Managment: General experience of comfort will improve 02/13/2017 0853 - Progressing by Quentin Cornwall, RN Safety: Ability to remain free from injury will improve 02/13/2017 0853 - Progressing by Quentin Cornwall, RN Skin Integrity: Risk for impaired skin integrity will decrease 02/13/2017 0853 - Progressing by Quentin Cornwall, RN

## 2017-02-13 NOTE — Progress Notes (Signed)
Progress Note  Patient Name: Hunter Choi Date of Encounter: 02/13/2017  Primary Cardiologist: Katrinka Blazing  Subjective   No chest pain or dyspnea  Inpatient Medications    Scheduled Meds: . amLODipine  5 mg Oral Daily  . aspirin EC  81 mg Oral Daily  . carvedilol  6.25 mg Oral BID WC  . fluticasone  1 spray Each Nare Daily  . folic acid  3 mg Oral Daily  . lactose free nutrition  237 mL Oral Q24H  . levothyroxine  50 mcg Oral QAC breakfast  . loratadine  10 mg Oral Daily  . Melatonin  3 mg Oral QHS  . [START ON 02/17/2017] methotrexate  25 mg Oral Q Fri  . pantoprazole  40 mg Oral Daily  . predniSONE  5 mg Oral Q breakfast  . simvastatin  20 mg Oral QPM  . sodium chloride flush  3 mL Intravenous Q12H   Continuous Infusions: . sodium chloride    . heparin 1,000 Units/hr (02/13/17 1146)   PRN Meds: sodium chloride, acetaminophen, nitroGLYCERIN, sodium chloride flush   Vital Signs    Vitals:   02/12/17 2002 02/13/17 0059 02/13/17 0500 02/13/17 0911  BP: (!) 149/63 (!) 169/67 (!) 160/92 (!) 152/62  Pulse: 64 69 66 64  Resp: 16 18 18    Temp: 98 F (36.7 C) 98.8 F (37.1 C) 99.3 F (37.4 C) 99.1 F (37.3 C)  TempSrc: Oral Oral Oral Oral  SpO2: 100% 94% 100% 99%  Weight: 153 lb 6.4 oz (69.6 kg)  153 lb 12.8 oz (69.8 kg)   Height: 5\' 7"  (1.702 m)       Intake/Output Summary (Last 24 hours) at 02/13/2017 1210 Last data filed at 02/13/2017 1155 Gross per 24 hour  Intake 1253.7 ml  Output 1350 ml  Net -96.3 ml   Filed Weights   02/12/17 1300 02/12/17 2002 02/13/17 0500  Weight: 152 lb (68.9 kg) 153 lb 6.4 oz (69.6 kg) 153 lb 12.8 oz (69.8 kg)    Telemetry    Sinus - Personally Reviewed  ECG    Sinus, first degree AV block. LBBB, chronic - Personally Reviewed  Physical Exam   GEN: No acute distress.   Neck: No JVD Cardiac: RRR, no murmurs, rubs, or gallops.  Respiratory: Clear to auscultation bilaterally. No crackles GI: Soft, nontender,  non-distended  Ext: No LE edema; No deformity. Neuro:  Nonfocal  Psych: Normal affect   Labs    Chemistry Recent Labs  Lab 02/12/17 1046 02/12/17 1206 02/13/17 0522  NA 133*  --  130*  K 4.0  --  4.4  CL 99*  --  99*  CO2 24  --  23  GLUCOSE 131*  --  85  BUN 24*  --  15  CREATININE 1.03  --  0.91  CALCIUM 8.5*  --  8.4*  PROT  --  7.7  --   ALBUMIN  --  3.0*  --   AST  --  14*  --   ALT  --  13*  --   ALKPHOS  --  154*  --   BILITOT  --  0.3  --   GFRNONAA >60  --  >60  GFRAA >60  --  >60  ANIONGAP 10  --  8     Hematology Recent Labs  Lab 02/12/17 1046 02/13/17 0522  WBC 9.9 10.6*  RBC 4.07* 3.93*  HGB 11.1* 10.9*  HCT 36.4* 35.1*  MCV 89.4 89.3  MCH 27.3 27.7  MCHC 30.5 31.1  RDW 15.9* 16.1*  PLT 139* 152    Cardiac EnzymesNo results for input(s): TROPONINI in the last 168 hours.  Recent Labs  Lab 02/12/17 1311  TROPIPOC 2.65*     BNP Recent Labs  Lab 02/13/17 0522  BNP 1,875.3*     DDimer No results for input(s): DDIMER in the last 168 hours.   Radiology    Dg Chest 2 View  Result Date: 02/12/2017 CLINICAL DATA:  Pt tried to ambulate out to his mailbox but could not due to general weakness PTA with Numbness to right hand and generalized weakness EXAM: CHEST - 2 VIEW COMPARISON:  10/02/2015 FINDINGS: Low lung volumes with resultant crowding of perihilar and bibasilar bronchovascular markings. Mild interstitial prominence bilaterally. No confluent airspace disease. Previous median sternotomy, CABG, AVR. Stable cardiomegaly. Atheromatous aorta. No effusion.  No pneumothorax. Surgical clips above the thoracic inlet on the right. Cholecystectomy clips. IMPRESSION: 1. Mild bilateral interstitial prominence, may be secondary to low lung volumes. 2. Stable cardiomegaly and postop change. Electronically Signed   By: Corlis Leak M.D.   On: 02/12/2017 12:01   Ct Head Wo Contrast  Result Date: 02/12/2017 CLINICAL DATA:  Right hand numbness.   Generalized weakness. EXAM: CT HEAD WITHOUT CONTRAST TECHNIQUE: Contiguous axial images were obtained from the base of the skull through the vertex without intravenous contrast. COMPARISON:  None. FINDINGS: Brain: No acute intracranial abnormality. Specifically, no hemorrhage, hydrocephalus, mass lesion, acute infarction, or significant intracranial injury. 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 There is atrophy and chronic small vessel disease changes. Vascular: No hyperdense vessel or unexpected calcification. Skull: No acute calvarial abnormality. Sinuses/Orbits: Visualized paranasal sinuses and mastoids clear. Orbital soft tissues unremarkable. Other: None IMPRESSION: Atrophy, chronic small vessel disease. 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 No acute intracranial abnormality. Electronically Signed   By: Charlett Nose M.D.   On: 02/12/2017 12:40    Cardiac Studies     Patient Profile     83 y.o. male with history of severe AS s/p TAVR in 2014, CAD s/p CABG in 1999 with PCI in 2004, HTN, HLD, PAF and ischemic cardiomyopathy with chronic systolic CHF admitted with weakness and found to have elevated troponin.   Assessment & Plan    1. CAD/NSTEMI: He is admitted with weakness and troponin is elevated. NO troponin checked since 1pm yesterday. Will cycle troponins.  He is known to have severe CAD with prior CABG and last cath in Feb 2017 with diffuse disease in 2 of his 3 remaining bypass grafts. I suspect that he recently lost one of his bypass grafts. For now will manage with IV heparin, ASA, beta blocker and statin. He may need a cardiac cath before discharge. Will review again in am based on data from echo and troponin levels.   2. Aortic stenosis: he is s/p TAVR in 2014. Assess valve with echo today  3. Ischemic cardiomyopathy: Last LVEF=25-30% in 2017. Will need to optimize medical therapy prior to discharge.   4. Chronic systolic CHF/Elevated BNP: He does not appear to be  volume overloaded today. Will follow closely.   For questions or updates, please contact [facility] Please consult [email or web address] for contact info under Cardiology/STEMI.      Signed, Verne Carrow, MD  02/13/2017, 12:10 PM

## 2017-02-13 NOTE — Progress Notes (Signed)
    Patient troponin resulted at 18.39 this afternoon. Talked with patient, he is not having any active chest pain. Hx of CABG. Will continue to trend troponins. Discussed with Dr. Clifton James. Plan for cardiac cath tomorrow. Remains on IV heparin.   The patient understands that risks included but are not limited to stroke (1 in 1000), death (1 in 1000), kidney failure [usually temporary] (1 in 500), bleeding (1 in 200), allergic reaction [possibly serious] (1 in 200).   SignedLaverda Page, NP-C 02/13/2017, 3:14 PM Pager: [phone]

## 2017-02-13 NOTE — Progress Notes (Signed)
CRITICAL VALUE ALERT  Critical Value:  Troponin 18.39  Date & Time Notied:  02/13/17 @ 1443  Provider Notified: [facility] paged  Orders Received/Actions taken: Awaiting call back

## 2017-02-13 NOTE — Progress Notes (Signed)
Spoke to Cardiology PA regarding patient's elevated troponin level of 18.39. Informed her that patient is denying any chest pain at this time, he is resting quietly in bed watching TV. She states she will talk to MD to see if he wants to do cath tomorrow. No new orders at this time. Will continue to monitor.

## 2017-02-13 NOTE — H&P (View-Only) (Signed)
Progress Note  Patient Name: Hunter Choi Date of Encounter: 02/13/2017  Primary Cardiologist: Katrinka Blazing  Subjective   No chest pain or dyspnea  Inpatient Medications    Scheduled Meds: . amLODipine  5 mg Oral Daily  . aspirin EC  81 mg Oral Daily  . carvedilol  6.25 mg Oral BID WC  . fluticasone  1 spray Each Nare Daily  . folic acid  3 mg Oral Daily  . lactose free nutrition  237 mL Oral Q24H  . levothyroxine  50 mcg Oral QAC breakfast  . loratadine  10 mg Oral Daily  . Melatonin  3 mg Oral QHS  . [START ON 02/17/2017] methotrexate  25 mg Oral Q Fri  . pantoprazole  40 mg Oral Daily  . predniSONE  5 mg Oral Q breakfast  . simvastatin  20 mg Oral QPM  . sodium chloride flush  3 mL Intravenous Q12H   Continuous Infusions: . sodium chloride    . heparin 1,000 Units/hr (02/13/17 1146)   PRN Meds: sodium chloride, acetaminophen, nitroGLYCERIN, sodium chloride flush   Vital Signs    Vitals:   02/12/17 2002 02/13/17 0059 02/13/17 0500 02/13/17 0911  BP: (!) 149/63 (!) 169/67 (!) 160/92 (!) 152/62  Pulse: 64 69 66 64  Resp: 16 18 18    Temp: 98 F (36.7 C) 98.8 F (37.1 C) 99.3 F (37.4 C) 99.1 F (37.3 C)  TempSrc: Oral Oral Oral Oral  SpO2: 100% 94% 100% 99%  Weight: 153 lb 6.4 oz (69.6 kg)  153 lb 12.8 oz (69.8 kg)   Height: 5\' 7"  (1.702 m)       Intake/Output Summary (Last 24 hours) at 02/13/2017 1210 Last data filed at 02/13/2017 1155 Gross per 24 hour  Intake 1253.7 ml  Output 1350 ml  Net -96.3 ml   Filed Weights   02/12/17 1300 02/12/17 2002 02/13/17 0500  Weight: 152 lb (68.9 kg) 153 lb 6.4 oz (69.6 kg) 153 lb 12.8 oz (69.8 kg)    Telemetry    Sinus - Personally Reviewed  ECG    Sinus, first degree AV block. LBBB, chronic - Personally Reviewed  Physical Exam   GEN: No acute distress.   Neck: No JVD Cardiac: RRR, no murmurs, rubs, or gallops.  Respiratory: Clear to auscultation bilaterally. No crackles GI: Soft, nontender,  non-distended  Ext: No LE edema; No deformity. Neuro:  Nonfocal  Psych: Normal affect   Labs    Chemistry Recent Labs  Lab 02/12/17 1046 02/12/17 1206 02/13/17 0522  NA 133*  --  130*  K 4.0  --  4.4  CL 99*  --  99*  CO2 24  --  23  GLUCOSE 131*  --  85  BUN 24*  --  15  CREATININE 1.03  --  0.91  CALCIUM 8.5*  --  8.4*  PROT  --  7.7  --   ALBUMIN  --  3.0*  --   AST  --  14*  --   ALT  --  13*  --   ALKPHOS  --  154*  --   BILITOT  --  0.3  --   GFRNONAA >60  --  >60  GFRAA >60  --  >60  ANIONGAP 10  --  8     Hematology Recent Labs  Lab 02/12/17 1046 02/13/17 0522  WBC 9.9 10.6*  RBC 4.07* 3.93*  HGB 11.1* 10.9*  HCT 36.4* 35.1*  MCV 89.4 89.3  MCH 27.3 27.7  MCHC 30.5 31.1  RDW 15.9* 16.1*  PLT 139* 152    Cardiac EnzymesNo results for input(s): TROPONINI in the last 168 hours.  Recent Labs  Lab 02/12/17 1311  TROPIPOC 2.65*     BNP Recent Labs  Lab 02/13/17 0522  BNP 1,875.3*     DDimer No results for input(s): DDIMER in the last 168 hours.   Radiology    Dg Chest 2 View  Result Date: 02/12/2017 CLINICAL DATA:  Pt tried to ambulate out to his mailbox but could not due to general weakness PTA with Numbness to right hand and generalized weakness EXAM: CHEST - 2 VIEW COMPARISON:  10/02/2015 FINDINGS: Low lung volumes with resultant crowding of perihilar and bibasilar bronchovascular markings. Mild interstitial prominence bilaterally. No confluent airspace disease. Previous median sternotomy, CABG, AVR. Stable cardiomegaly. Atheromatous aorta. No effusion.  No pneumothorax. Surgical clips above the thoracic inlet on the right. Cholecystectomy clips. IMPRESSION: 1. Mild bilateral interstitial prominence, may be secondary to low lung volumes. 2. Stable cardiomegaly and postop change. Electronically Signed   By: D  Hassell M.D.   On: 02/12/2017 12:01   Ct Head Wo Contrast  Result Date: 02/12/2017 CLINICAL DATA:  Right hand numbness.   Generalized weakness. EXAM: CT HEAD WITHOUT CONTRAST TECHNIQUE: Contiguous axial images were obtained from the base of the skull through the vertex without intravenous contrast. COMPARISON:  None. FINDINGS: Brain: No acute intracranial abnormality. Specifically, no hemorrhage, hydrocephalus, mass lesion, acute infarction, or significant intracranial injury. 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 There is atrophy and chronic small vessel disease changes. Vascular: No hyperdense vessel or unexpected calcification. Skull: No acute calvarial abnormality. Sinuses/Orbits: Visualized paranasal sinuses and mastoids clear. Orbital soft tissues unremarkable. Other: None IMPRESSION: Atrophy, chronic small vessel disease. 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 No acute intracranial abnormality. Electronically Signed   By: Kevin  Dover M.D.   On: 02/12/2017 12:40    Cardiac Studies     Patient Profile     83 y.o. male with history of severe AS s/p TAVR in 2014, CAD s/p CABG in 1999 with PCI in 2004, HTN, HLD, PAF and ischemic cardiomyopathy with chronic systolic CHF admitted with weakness and found to have elevated troponin.   Assessment & Plan    1. CAD/NSTEMI: He is admitted with weakness and troponin is elevated. NO troponin checked since 1pm yesterday. Will cycle troponins.  He is known to have severe CAD with prior CABG and last cath in Feb 2017 with diffuse disease in 2 of his 3 remaining bypass grafts. I suspect that he recently lost one of his bypass grafts. For now will manage with IV heparin, ASA, beta blocker and statin. He may need a cardiac cath before discharge. Will review again in am based on data from echo and troponin levels.   2. Aortic stenosis: he is s/p TAVR in 2014. Assess valve with echo today  3. Ischemic cardiomyopathy: Last LVEF=25-30% in 2017. Will need to optimize medical therapy prior to discharge.   4. Chronic systolic CHF/Elevated BNP: He does not appear to be  volume overloaded today. Will follow closely.   For questions or updates, please contact [facility] Please consult [email or web address] for contact info under Cardiology/STEMI.      Signed, Abbygayle Helfand, MD  02/13/2017, 12:10 PM    

## 2017-02-13 NOTE — Progress Notes (Signed)
ANTICOAGULATION CONSULT NOTE - Follow-Up Consult  Pharmacy Consult for heparin Indication: chest pain/ACS  Patient Measurements: Height: 5\' 7"  (170.2 cm) Weight: 153 lb 12.8 oz (69.8 kg) IBW/kg (Calculated) : 66.1 Heparin Dosing Weight: 68.9kg  Vital Signs: Temp: 99.3 F (37.4 C) (11/19 0500) Temp Source: Oral (11/19 0500) BP: 160/92 (11/19 0500) Pulse Rate: 66 (11/19 0500)  Labs: Recent Labs    02/12/17 1046 02/12/17 2100 02/13/17 0522  HGB 11.1*  --  10.9*  HCT 36.4*  --  35.1*  PLT 139*  --  152  HEPARINUNFRC  --  0.27* 0.34  CREATININE 1.03  --  0.91    Estimated Creatinine Clearance: 53.5 mL/min (by C-G formula based on SCr of 0.91 mg/dL).   Medications:  Infusions:  . sodium chloride    . heparin 1,000 Units/hr (02/12/17 2154)    Assessment: 89 yom presented to the ED with right hand numbness and generalized weakness. Troponin found to be elevated and now starting IV heparin. Baseline H/H and platelets are slightly low. He is not on anticoagulation PTA. Per discussion with provider, does not think this is an acute stroke at this time and ok with continuing standard ACS dosing of heparin.   Heparin level therapeutic this AM  Goal of Therapy:  Heparin level 0.3-0.7 units/ml Monitor platelets by anticoagulation protocol: Yes   Plan:  Continue heparin at 1000 units / hr Follow up AM labs  Thank you [phone], PharmD [phone] 02/13/2017 8:08 AM

## 2017-02-14 ENCOUNTER — Encounter (HOSPITAL_COMMUNITY): Payer: Self-pay | Admitting: Cardiology

## 2017-02-14 ENCOUNTER — Inpatient Hospital Stay (HOSPITAL_COMMUNITY): Payer: Medicare Other

## 2017-02-14 ENCOUNTER — Inpatient Hospital Stay (HOSPITAL_COMMUNITY)
Admission: EM | Disposition: A | Discharge status: Skilled Nursing Facility | Payer: Self-pay | Source: Home / Self Care | Attending: Internal Medicine

## 2017-02-14 DIAGNOSIS — I340 Nonrheumatic mitral (valve) insufficiency: Secondary | ICD-10-CM

## 2017-02-14 DIAGNOSIS — I255 Ischemic cardiomyopathy: Secondary | ICD-10-CM

## 2017-02-14 DIAGNOSIS — I359 Nonrheumatic aortic valve disorder, unspecified: Secondary | ICD-10-CM

## 2017-02-14 HISTORY — PX: LEFT HEART CATH AND CORS/GRAFTS ANGIOGRAPHY: CATH118250

## 2017-02-14 LAB — TROPONIN I: TROPONIN I: 14.24 ng/mL — AB (ref <0.03)

## 2017-02-14 LAB — BASIC METABOLIC PANEL
Anion gap: 9 (ref 5–15)
BUN: 22 mg/dL — AB (ref 6–20)
CO2: 22 mmol/L (ref 22–32)
Calcium: 8 mg/dL — ABNORMAL LOW (ref 8.9–10.3)
Chloride: 94 mmol/L — ABNORMAL LOW (ref 101–111)
Creatinine, Ser: 1.31 mg/dL — ABNORMAL HIGH (ref 0.61–1.24)
GFR calc non Af Amer: 47 mL/min — ABNORMAL LOW (ref >60)
GFR, EST AFRICAN AMERICAN: 55 mL/min — AB (ref >60)
Glucose, Bld: 139 mg/dL — ABNORMAL HIGH (ref 65–99)
POTASSIUM: 4 mmol/L (ref 3.5–5.1)
SODIUM: 125 mmol/L — AB (ref 135–145)

## 2017-02-14 LAB — PROTIME-INR
INR: 1.15
PROTHROMBIN TIME: 14.6 s (ref 11.4–15.2)

## 2017-02-14 LAB — ECHOCARDIOGRAM COMPLETE
HEIGHTINCHES: 67 in
Weight: 2460.8 oz

## 2017-02-14 LAB — CBC
HCT: 36.9 % — ABNORMAL LOW (ref 39.0–52.0)
HEMOGLOBIN: 11.6 g/dL — AB (ref 13.0–17.0)
MCH: 27.8 pg (ref 26.0–34.0)
MCHC: 31.4 g/dL (ref 30.0–36.0)
MCV: 88.3 fL (ref 78.0–100.0)
Platelets: 158 10*3/uL (ref 150–400)
RBC: 4.18 MIL/uL — ABNORMAL LOW (ref 4.22–5.81)
RDW: 16.3 % — AB (ref 11.5–15.5)
WBC: 9.7 10*3/uL (ref 4.0–10.5)

## 2017-02-14 LAB — HEPARIN LEVEL (UNFRACTIONATED): HEPARIN UNFRACTIONATED: 0.34 [IU]/mL (ref 0.30–0.70)

## 2017-02-14 LAB — POCT ACTIVATED CLOTTING TIME: ACTIVATED CLOTTING TIME: 120 s

## 2017-02-14 SURGERY — LEFT HEART CATH AND CORS/GRAFTS ANGIOGRAPHY
Anesthesia: LOCAL

## 2017-02-14 MED ORDER — PERFLUTREN LIPID MICROSPHERE
1.0000 mL | INTRAVENOUS | Status: AC | PRN
  Administered 2017-02-14: 2 mL via INTRAVENOUS
  Filled 2017-02-14: qty 10

## 2017-02-14 MED ORDER — IOPAMIDOL (ISOVUE-370) INJECTION 76%
INTRAVENOUS | Status: DC | PRN
  Administered 2017-02-14: 90 mL via INTRA_ARTERIAL

## 2017-02-14 MED ORDER — HEPARIN (PORCINE) IN NACL 2-0.9 UNIT/ML-% IJ SOLN
INTRAMUSCULAR | Status: AC | PRN
  Administered 2017-02-14: 1000 mL

## 2017-02-14 MED ORDER — CLOPIDOGREL BISULFATE 75 MG PO TABS
75.0000 mg | ORAL_TABLET | Freq: Every day | ORAL | Status: DC
  Administered 2017-02-15 – 2017-02-17 (×3): 75 mg via ORAL
  Filled 2017-02-14 (×4): qty 1

## 2017-02-14 MED ORDER — SODIUM CHLORIDE 0.9 % IV SOLN: INTRAVENOUS | Status: AC

## 2017-02-14 MED ORDER — SODIUM CHLORIDE 0.9% FLUSH: 3.0000 mL | INTRAVENOUS | Status: DC | PRN

## 2017-02-14 MED ORDER — SODIUM CHLORIDE 0.9% FLUSH
3.0000 mL | Freq: Two times a day (BID) | INTRAVENOUS | Status: DC
  Administered 2017-02-14 – 2017-02-16 (×3): 3 mL via INTRAVENOUS

## 2017-02-14 MED ORDER — HEPARIN (PORCINE) IN NACL 100-0.45 UNIT/ML-% IJ SOLN
1100.0000 [IU]/h | INTRAMUSCULAR | Status: DC
  Administered 2017-02-14: 1100 [IU]/h via INTRAVENOUS
  Filled 2017-02-14: qty 250

## 2017-02-14 MED ORDER — IOHEXOL 350 MG/ML SOLN: INTRAVENOUS | Status: DC | PRN

## 2017-02-14 MED ORDER — LIDOCAINE HCL (PF) 1 % IJ SOLN
INTRAMUSCULAR | Status: AC
Start: 2017-02-14 — End: ?
  Filled 2017-02-14: qty 30

## 2017-02-14 MED ORDER — SODIUM CHLORIDE 0.9 % IV SOLN: 250.0000 mL | INTRAVENOUS | Status: DC | PRN

## 2017-02-14 MED ORDER — ONDANSETRON HCL 4 MG/2ML IJ SOLN
4.0000 mg | Freq: Four times a day (QID) | INTRAMUSCULAR | Status: DC | PRN
  Administered 2017-02-17: 4 mg via INTRAVENOUS
  Filled 2017-02-14: qty 2

## 2017-02-14 MED ORDER — CLOPIDOGREL BISULFATE 75 MG PO TABS
300.0000 mg | ORAL_TABLET | Freq: Once | ORAL | Status: AC
  Administered 2017-02-14: 300 mg via ORAL
  Filled 2017-02-14: qty 4

## 2017-02-14 MED ORDER — HEPARIN (PORCINE) IN NACL 2-0.9 UNIT/ML-% IJ SOLN
INTRAMUSCULAR | Status: AC
  Filled 2017-02-14: qty 1000

## 2017-02-14 SURGICAL SUPPLY — 10 items
CATH INFINITI 5FR MULTPACK ANG (CATHETERS) ×2 IMPLANT
KIT HEART LEFT (KITS) ×2 IMPLANT
PACK CARDIAC CATHETERIZATION (CUSTOM PROCEDURE TRAY) ×2 IMPLANT
PINNACLE LONG 6F 25CM (SHEATH) ×2
SHEATH INTRO PINNACLE 6F 25CM (SHEATH) ×1 IMPLANT
SHEATH PINNACLE 5F 10CM (SHEATH) ×2 IMPLANT
TRANSDUCER W/STOPCOCK (MISCELLANEOUS) ×2 IMPLANT
WIRE EMERALD 3MM-J .035X150CM (WIRE) ×2 IMPLANT
WIRE EMERALD 3MM-J .035X260CM (WIRE) ×2 IMPLANT
WIRE HI TORQ VERSACORE-J 145CM (WIRE) ×2 IMPLANT

## 2017-02-14 NOTE — Progress Notes (Signed)
  Echocardiogram 2D Echocardiogram with Definity has been performed.  Nolon Rod 02/14/2017, 2:27 PM

## 2017-02-14 NOTE — Progress Notes (Addendum)
Site area: RFA Site Prior to Removal:  Level 0 Pressure Applied For:25 min Manual:   yes Patient Status During Pull:  stable Post Pull Site:  Level 0 Post Pull Instructions Given: yes  Post Pull Pulses Present: weak-palpable Dressing Applied:  tegaderm Bedrest begins @ 1115 till 1515 Comments:

## 2017-02-14 NOTE — Progress Notes (Signed)
ANTICOAGULATION CONSULT NOTE - Follow-Up Consult  Pharmacy Consult for heparin Indication: chest pain/ACS  Patient Measurements: Height: 5\' 7"  (170.2 cm) Weight: 153 lb 12.8 oz (69.8 kg) IBW/kg (Calculated) : 66.1 Heparin Dosing Weight: 68.9kg  Vital Signs: BP: 101/44 (11/20 1113) Pulse Rate: 56 (11/20 1113)  Labs: Recent Labs    02/12/17 1046 02/12/17 2100 02/13/17 0522 02/13/17 1252 02/13/17 1828 02/14/17 0005  HGB 11.1*  --  10.9*  --   --  11.6*  HCT 36.4*  --  35.1*  --   --  36.9*  PLT 139*  --  152  --   --  158  LABPROT  --   --   --   --   --  14.6  INR  --   --   --   --   --  1.15  HEPARINUNFRC  --  0.27* 0.34  --   --  0.34  CREATININE 1.03  --  0.91  --   --  1.31*  TROPONINI  --   --   --  18.39* 15.00* 14.24*    Estimated Creatinine Clearance: 37.1 mL/min (A) (by C-G formula based on SCr of 1.31 mg/dL (H)).   Medications:  Infusions:  . sodium chloride    . sodium chloride 450 mL (02/14/17 1042)  . heparin      Assessment: 89 yom to continue heparin s/p cath x 48 hours To resume 8 hours post sheath removal  Goal of Therapy:  Heparin level 0.3-0.7 units/ml Monitor platelets by anticoagulation protocol: Yes   Plan:  Heparin at 1100 units / hr starting at 1900 pm tonight Follow up AM labs  Thank you 02/16/17, PharmD [phone] 02/14/2017 11:35 AM

## 2017-02-14 NOTE — Interval H&P Note (Signed)
History and Physical Interval Note:  02/14/2017 9:13 AM  Hunter Choi  has presented today for surgery, with the diagnosis of non-STEMI with known CAD the various methods of treatment have been discussed with the patient and family. After consideration of risks, benefits and other options for treatment, the patient has consented to  Procedure(s): LEFT HEART CATH AND CORS/GRAFTS ANGIOGRAPHY (N/A) with possible PERCUTANEOUS CORONARY INTERVENTION as a surgical intervention .  The patient's history has been reviewed, patient examined, no change in status, stable for surgery.  I have reviewed the patient's chart and labs.  Questions were answered to the patient's satisfaction.     Cath Lab Visit (complete for each Cath Lab visit)  Clinical Evaluation Leading to the Procedure:   ACS: Yes.    Non-ACS:    Anginal Classification: CCS III  Anti-ischemic medical therapy: Maximal Therapy (2 or more classes of medications)  Non-Invasive Test Results: No non-invasive testing performed  Prior CABG: Previous CABG   Bryan Lemma

## 2017-02-14 NOTE — Plan of Care (Signed)
Pt. Taking PO meds well today. Output WNL, no complaints of pain. Pt. Bed alarm on and pt. Told to call for assistance when attempting to get out of bed. Pt. Verbalized understanding.

## 2017-02-14 NOTE — Progress Notes (Signed)
Progress Note  Patient Name: Hunter Choi Date of Encounter: 02/14/2017  Primary Cardiologist: Katrinka Blazing  Subjective   Pt denies chest pain or dyspnea.   Inpatient Medications    Scheduled Meds: . amLODipine  5 mg Oral Daily  . aspirin EC  81 mg Oral Daily  . carvedilol  6.25 mg Oral BID WC  . [START ON 02/15/2017] clopidogrel  75 mg Oral Daily  . fluticasone  1 spray Each Nare Daily  . folic acid  3 mg Oral Daily  . lactose free nutrition  237 mL Oral Q24H  . levothyroxine  50 mcg Oral QAC breakfast  . loratadine  10 mg Oral Daily  . Melatonin  3 mg Oral QHS  . [START ON 02/17/2017] methotrexate  25 mg Oral Q Fri  . pantoprazole  40 mg Oral Daily  . predniSONE  5 mg Oral Q breakfast  . simvastatin  20 mg Oral QPM  . sodium chloride flush  3 mL Intravenous Q12H  . sodium chloride flush  3 mL Intravenous Q12H   Continuous Infusions: . sodium chloride    . sodium chloride 450 mL (02/14/17 1042)  . sodium chloride    . heparin     PRN Meds: sodium chloride, sodium chloride, acetaminophen, nitroGLYCERIN, ondansetron (ZOFRAN) IV, perflutren lipid microspheres (DEFINITY) IV suspension, sodium chloride flush, sodium chloride flush   Vital Signs    Vitals:   02/14/17 1113 02/14/17 1145 02/14/17 1200 02/14/17 1215  BP: (!) 101/44 (!) 121/53 (!) 90/44 110/64  Pulse: (!) 56 (!) 58 (!) 58 (!) 58  Resp: (!) 9 14    Temp:      TempSrc:      SpO2: 100% 100%    Weight:      Height:        Intake/Output Summary (Last 24 hours) at 02/14/2017 1349 Last data filed at 02/14/2017 1259 Gross per 24 hour  Intake 1060 ml  Output 925 ml  Net 135 ml   Filed Weights   02/12/17 1300 02/12/17 2002 02/13/17 0500  Weight: 152 lb (68.9 kg) 153 lb 6.4 oz (69.6 kg) 153 lb 12.8 oz (69.8 kg)    Telemetry    sinus - Personally Reviewed  ECG      Physical Exam   General: Well developed, well nourished, NAD. He is very hard of hearing HEENT: OP clear, mucus membranes moist    SKIN: warm, dry. No rashes. Neuro: No focal deficits  Musculoskeletal: Muscle strength 5/5 all ext  Psychiatric: Mood and affect normal  Neck: No JVD, no carotid bruits, no thyromegaly, no lymphadenopathy.  Lungs:Clear bilaterally, no wheezes, rhonci, crackles Cardiovascular: Regular rate and rhythm. Systolic murmur noted.  Abdomen:Soft. Bowel sounds present. Non-tender.  Extremities: No lower extremity edema.    Labs    Chemistry Recent Labs  Lab 02/12/17 1046 02/12/17 1206 02/13/17 0522 02/14/17 0005  NA 133*  --  130* 125*  K 4.0  --  4.4 4.0  CL 99*  --  99* 94*  CO2 24  --  23 22  GLUCOSE 131*  --  85 139*  BUN 24*  --  15 22*  CREATININE 1.03  --  0.91 1.31*  CALCIUM 8.5*  --  8.4* 8.0*  PROT  --  7.7  --   --   ALBUMIN  --  3.0*  --   --   AST  --  14*  --   --   ALT  --  13*  --   --   ALKPHOS  --  154*  --   --   BILITOT  --  0.3  --   --   GFRNONAA >60  --  >60 47*  GFRAA >60  --  >60 55*  ANIONGAP 10  --  8 9     Hematology Recent Labs  Lab 02/12/17 1046 02/13/17 0522 02/14/17 0005  WBC 9.9 10.6* 9.7  RBC 4.07* 3.93* 4.18*  HGB 11.1* 10.9* 11.6*  HCT 36.4* 35.1* 36.9*  MCV 89.4 89.3 88.3  MCH 27.3 27.7 27.8  MCHC 30.5 31.1 31.4  RDW 15.9* 16.1* 16.3*  PLT 139* 152 158    Cardiac Enzymes Recent Labs  Lab 02/13/17 1252 02/13/17 1828 02/14/17 0005  TROPONINI 18.39* 15.00* 14.24*    Recent Labs  Lab 02/12/17 1311  TROPIPOC 2.65*     BNP Recent Labs  Lab 02/13/17 0522  BNP 1,875.3*     DDimer No results for input(s): DDIMER in the last 168 hours.   Radiology    No results found.  Cardiac Studies   Cardiac cath 02/14/17:  Mid LM to Ost LAD lesion is 65% stenosed.  Ost Cx to Prox Cx lesion is 90% stenosed. Mid Cx lesion is 100% stenosed.  Ost LAD to Prox LAD lesion is 99% stenosed. Prox LAD to Mid LAD lesion is 100% stenosed.  LIMA-LAD graft is widely patent  Ost 1st Diag lesion is 99% stenosed.  SVG-Diag1/Ramus  -very large, patulous/ectatic vessel. The grafted vessel is smooth without disease.  SVG-OM - prox Graft lesion is 70% focal, thrombotic lesion in a valve segment.  Prox RCA lesion is 75% stenosed. Dist RCA lesion is 100% stenosed with 100% stenosed side branch in Post Atrio.  SVG-distal RCA origin lesion is 100% stenosed.  There is severe left ventricular systolic dysfunction.  LV end diastolic pressure is moderately elevated.  The left ventricular ejection fraction is 25-35% by visual estimate.  Diagnostic Diagram         Patient Profile     83 y.o. male with history of severe AS s/p TAVR in 2014, CAD s/p CABG in 1999 with PCI in 2004, HTN, HLD, PAF and ischemic cardiomyopathy with chronic systolic CHF admitted with weakness and found to have elevated troponin c/w NSTEMI  Assessment & Plan    1. CAD/NSTEMI: Pt admitted with weakness and found to have a NSTEMI. Cardiac cath today with occluded left coronary system and severe disease in the proximal RCA. The graft to the RCA is chronically occluded. The graft to the OM appears to be the culprit vessel with thrombus in the graft. This graft does not appear to be a target for PCI. The vein graft to the intermediate branch has severe diffuse disease and is not a target for PCI. The LIMA graft to the LAD is patent. There are no good options for PCI.  Will continue medical therapy with ASA, statin, Plavix, beta blocker, Norvasc.   2. Aortic stenosis: he is s/p TAVR in 2014. Echo pending today  3. Ischemic cardiomyopathy: He has been known to have an ischemic cardiomyopathy. Will attempt to optimize medications. Will add Ace-inh or ARB if renal function is stable tomorrow.  4. Chronic systolic CHF/Elevated BNP: Volume status is ok today. He is not on Lasix. Reassess in am.   For questions or updates, please contact [facility] Please consult [email or web address] for contact info under Cardiology/STEMI.      Signed, Verne Carrow, MD  02/14/2017, 1:49 PM

## 2017-02-14 NOTE — Progress Notes (Signed)
Pt. Refusing care this am from staff. Pt. Verbally and physically aggressive. Staff Unable to obtain VS, morning weight, administer pre-cath or morning medications. Lab technician also unable to draw morning labs. Also unable to do Groin prep for procedure or obtain EKG.

## 2017-02-15 LAB — CBC
HCT: 32 % — ABNORMAL LOW (ref 39.0–52.0)
Hemoglobin: 10.3 g/dL — ABNORMAL LOW (ref 13.0–17.0)
MCH: 28.1 pg (ref 26.0–34.0)
MCHC: 32.2 g/dL (ref 30.0–36.0)
MCV: 87.2 fL (ref 78.0–100.0)
Platelets: 141 10*3/uL — ABNORMAL LOW (ref 150–400)
RBC: 3.67 MIL/uL — AB (ref 4.22–5.81)
RDW: 16.3 % — ABNORMAL HIGH (ref 11.5–15.5)
WBC: 7.8 10*3/uL (ref 4.0–10.5)

## 2017-02-15 LAB — BASIC METABOLIC PANEL
ANION GAP: 8 (ref 5–15)
BUN: 26 mg/dL — AB (ref 6–20)
CHLORIDE: 95 mmol/L — AB (ref 101–111)
CO2: 22 mmol/L (ref 22–32)
Calcium: 7.7 mg/dL — ABNORMAL LOW (ref 8.9–10.3)
Creatinine, Ser: 1.07 mg/dL (ref 0.61–1.24)
GFR calc Af Amer: >60 mL/min (ref >60)
GFR calc non Af Amer: >60 mL/min (ref >60)
Glucose, Bld: 104 mg/dL — ABNORMAL HIGH (ref 65–99)
POTASSIUM: 3.8 mmol/L (ref 3.5–5.1)
SODIUM: 125 mmol/L — AB (ref 135–145)

## 2017-02-15 MED ORDER — LOSARTAN POTASSIUM 25 MG PO TABS
25.0000 mg | ORAL_TABLET | Freq: Every day | ORAL | Status: DC
  Administered 2017-02-15 – 2017-02-17 (×3): 25 mg via ORAL
  Filled 2017-02-15 (×3): qty 1

## 2017-02-15 MED FILL — Lidocaine HCl Local Preservative Free (PF) Inj 1%: INTRAMUSCULAR | Qty: 30 | Status: AC

## 2017-02-15 NOTE — Care Management Important Message (Signed)
Important Message  Patient Details  Name: ADEEL GUIFFRE MRN: [id number] Date of Birth: 01/18/1928   Medicare Important Message Given:  Yes    Laury Huizar Stefan Church 02/15/2017, 12:51 PM

## 2017-02-15 NOTE — Evaluation (Signed)
Physical Therapy Evaluation Patient Details Name: Hunter Choi MRN: [id number] DOB: 1927/04/11 Today's Date: 02/15/2017   History of Present Illness  Pt is an 83 y/o male admitted secondary to generalized weakness. Pt found to have a NSTEMI. PMH including but not limited to CVA, HTN, CHF and CAD.  Clinical Impression  Pt presented supine in bed with HOB elevated, awake and willing to participate in therapy session. Pt previously independent with ADLs and ambulates with use of RW. Pt reported that he gets very fatigued with short distance ambulation and has no strength. Pt currently requires min A for transfers and min A with very short distance ambulation (~20') using a RW. Pt with very poor endurance and limited secondary to fatigue. Pt would continue to benefit from skilled physical therapy services at this time while admitted and after d/c to address the below listed limitations in order to improve overall safety and independence with functional mobility.     Follow Up Recommendations SNF    Equipment Recommendations  None recommended by PT    Recommendations for Other Services       Precautions / Restrictions Precautions Precautions: Fall Restrictions Weight Bearing Restrictions: No      Mobility  Bed Mobility Overal bed mobility: Needs Assistance Bed Mobility: Supine to Sit;Sit to Supine     Supine to sit: Supervision Sit to supine: Supervision   General bed mobility comments: increased time and effort  Transfers Overall transfer level: Needs assistance Equipment used: Rolling walker (2 wheeled) Transfers: Sit to/from Stand Sit to Stand: Min assist         General transfer comment: assist for stability with transition  Ambulation/Gait Ambulation/Gait assistance: Min assist Ambulation Distance (Feet): 20 Feet Assistive device: Rolling walker (2 wheeled) Gait Pattern/deviations: Step-through pattern;Decreased step length - right;Decreased step length -  left;Decreased stride length;Trunk flexed Gait velocity: decreased   General Gait Details: pt very limited secondary to fatigue. pt with mild to modest instability requiring min A  Stairs            Wheelchair Mobility    Modified Rankin (Stroke Patients Only)       Balance Overall balance assessment: Needs assistance Sitting-balance support: Feet supported Sitting balance-Leahy Scale: Fair     Standing balance support: During functional activity;Bilateral upper extremity supported Standing balance-Leahy Scale: Poor Standing balance comment: reliant on bilateral UEs on RW                             Pertinent Vitals/Pain Pain Assessment: No/denies pain    Home Living Family/patient expects to be discharged to:: Private residence Living Arrangements: Children Available Help at Discharge: Family;Available 24 hours/day Type of Home: House Home Access: Level entry     Home Layout: One level Home Equipment: Walker - 4 wheels      Prior Function Level of Independence: Independent with assistive device(s)         Comments: pt ambulates with RW/rollator     Hand Dominance        Extremity/Trunk Assessment   Upper Extremity Assessment Upper Extremity Assessment: Generalized weakness    Lower Extremity Assessment Lower Extremity Assessment: Generalized weakness       Communication   Communication: No difficulties  Cognition Arousal/Alertness: Awake/alert Behavior During Therapy: WFL for tasks assessed/performed Overall Cognitive Status: Within Functional Limits for tasks assessed  General Comments      Exercises     Assessment/Plan    PT Assessment Patient needs continued PT services  PT Problem List Decreased strength;Decreased activity tolerance;Decreased balance;Decreased mobility;Decreased coordination;Decreased knowledge of use of DME;Decreased safety  awareness;Cardiopulmonary status limiting activity       PT Treatment Interventions DME instruction;Gait training;Functional mobility training;Therapeutic exercise;Stair training;Therapeutic activities;Balance training;Neuromuscular re-education;Patient/family education    PT Goals (Current goals can be found in the Care Plan section)  Acute Rehab PT Goals Patient Stated Goal: return to independence PT Goal Formulation: With patient Time For Goal Achievement: 03/01/17 Potential to Achieve Goals: Fair    Frequency Min 2X/week   Barriers to discharge        Co-evaluation               AM-PAC PT "6 Clicks" Daily Activity  Outcome Measure Difficulty turning over in bed (including adjusting bedclothes, sheets and blankets)?: A Little Difficulty moving from lying on back to sitting on the side of the bed? : A Little Difficulty sitting down on and standing up from a chair with arms (e.g., wheelchair, bedside commode, etc,.)?: Unable Help needed moving to and from a bed to chair (including a wheelchair)?: A Little Help needed walking in hospital room?: A Lot Help needed climbing 3-5 steps with a railing? : A Lot 6 Click Score: 14    End of Session Equipment Utilized During Treatment: Gait belt Activity Tolerance: Patient limited by fatigue Patient left: in bed;with call bell/phone within reach;with bed alarm set Nurse Communication: Mobility status PT Visit Diagnosis: Other abnormalities of gait and mobility (R26.89)    Time: 9604-5409 PT Time Calculation (min) (ACUTE ONLY): 12 min   Charges:   PT Evaluation $PT Eval Moderate Complexity: 1 Mod     PT G Codes:        [place], PT, DPT [phone]   Alessandra Bevels Maylyn Narvaiz 02/15/2017, 5:18 PM

## 2017-02-15 NOTE — Progress Notes (Signed)
Progress Note  Patient Name: Hunter Choi Date of Encounter: 02/15/2017  Primary Cardiologist: Katrinka Blazing  Subjective   No chest pain, still very weak.   Inpatient Medications    Scheduled Meds: . amLODipine  5 mg Oral Daily  . aspirin EC  81 mg Oral Daily  . carvedilol  6.25 mg Oral BID WC  . clopidogrel  75 mg Oral Daily  . fluticasone  1 spray Each Nare Daily  . folic acid  3 mg Oral Daily  . lactose free nutrition  237 mL Oral Q24H  . levothyroxine  50 mcg Oral QAC breakfast  . loratadine  10 mg Oral Daily  . Melatonin  3 mg Oral QHS  . [START ON 02/17/2017] methotrexate  25 mg Oral Q Fri  . pantoprazole  40 mg Oral Daily  . predniSONE  5 mg Oral Q breakfast  . simvastatin  20 mg Oral QPM  . sodium chloride flush  3 mL Intravenous Q12H  . sodium chloride flush  3 mL Intravenous Q12H   Continuous Infusions: . sodium chloride    . sodium chloride    . heparin 1,100 Units/hr (02/14/17 2119)   PRN Meds: sodium chloride, sodium chloride, acetaminophen, nitroGLYCERIN, ondansetron (ZOFRAN) IV, sodium chloride flush, sodium chloride flush   Vital Signs    Vitals:   02/14/17 1400 02/14/17 1936 02/15/17 0054 02/15/17 0500  BP: (!) 117/50 (!) 122/51 133/73 (!) 135/59  Pulse:  63 68 66  Resp:  18 18 20   Temp:  98.7 F (37.1 C) 99.9 F (37.7 C) 100 F (37.8 C)  TempSrc:  Oral Oral Oral  SpO2:  100% 100% 100%  Weight:    156 lb 4.9 oz (70.9 kg)  Height:        Intake/Output Summary (Last 24 hours) at 02/15/2017 1055 Last data filed at 02/15/2017 0900 Gross per 24 hour  Intake 1267.5 ml  Output 701 ml  Net 566.5 ml   Filed Weights   02/12/17 2002 02/13/17 0500 02/15/17 0500  Weight: 153 lb 6.4 oz (69.6 kg) 153 lb 12.8 oz (69.8 kg) 156 lb 4.9 oz (70.9 kg)    Telemetry    SR - Personally Reviewed  Physical Exam   General: Well developed, well nourished, male appearing in no acute distress. Very HOH Head: Normocephalic, atraumatic.  Neck: Supple without  bruits, JVD. Lungs:  Resp regular and unlabored, CTA. Heart: RRR, S1, S2, no S3, S4, or murmur; no rub. Abdomen: Soft, non-tender, non-distended with normoactive bowel sounds.  Extremities: No clubbing, cyanosis, edema. Distal pedal pulses are 2+ bilaterally. Right femoral cath site stable. Neuro: Alert and oriented X 3. Moves all extremities spontaneously. Psych: Normal affect.  Labs    Chemistry Recent Labs  Lab 02/12/17 1206 02/13/17 0522 02/14/17 0005 02/15/17 0452  NA  --  130* 125* 125*  K  --  4.4 4.0 3.8  CL  --  99* 94* 95*  CO2  --  23 22 22   GLUCOSE  --  85 139* 104*  BUN  --  15 22* 26*  CREATININE  --  0.91 1.31* 1.07  CALCIUM  --  8.4* 8.0* 7.7*  PROT 7.7  --   --   --   ALBUMIN 3.0*  --   --   --   AST 14*  --   --   --   ALT 13*  --   --   --   ALKPHOS 154*  --   --   --  BILITOT 0.3  --   --   --   GFRNONAA  --  >60 47* >60  GFRAA  --  >60 55* >60  ANIONGAP  --  8 9 8      Hematology Recent Labs  Lab 02/13/17 0522 02/14/17 0005 02/15/17 0452  WBC 10.6* 9.7 7.8  RBC 3.93* 4.18* 3.67*  HGB 10.9* 11.6* 10.3*  HCT 35.1* 36.9* 32.0*  MCV 89.3 88.3 87.2  MCH 27.7 27.8 28.1  MCHC 31.1 31.4 32.2  RDW 16.1* 16.3* 16.3*  PLT 152 158 141*    Cardiac Enzymes Recent Labs  Lab 02/13/17 1252 02/13/17 1828 02/14/17 0005  TROPONINI 18.39* 15.00* 14.24*    Recent Labs  Lab 02/12/17 1311  TROPIPOC 2.65*     BNP Recent Labs  Lab 02/13/17 0522  BNP 1,875.3*     DDimer No results for input(s): DDIMER in the last 168 hours.    Radiology    No results found.  Cardiac Studies   Cardiac cath 02/14/17:  Mid LM to Ost LAD lesion is 65% stenosed.  Ost Cx to Prox Cx lesion is 90% stenosed. Mid Cx lesion is 100% stenosed.  Ost LAD to Prox LAD lesion is 99% stenosed. Prox LAD to Mid LAD lesion is 100% stenosed.  LIMA-LAD graft is widely patent  Ost 1st Diag lesion is 99% stenosed.  SVG-Diag1/Ramus -very large, patulous/ectatic vessel.  The grafted vessel is smooth without disease.  SVG-OM - prox Graft lesion is 70% focal, thrombotic lesion in a valve segment.  Prox RCA lesion is 75% stenosed. Dist RCA lesion is 100% stenosed with 100% stenosed side branch in Post Atrio.  SVG-distal RCA origin lesion is 100% stenosed.  There is severe left ventricular systolic dysfunction.  LV end diastolic pressure is moderately elevated.  The left ventricular ejection fraction is 25-35% by visual estimate.  Diagnostic Diagram      TTE: 02/14/17  Study Conclusions  - Left ventricle: The cavity size was normal. Systolic function was   moderately reduced. The estimated ejection fraction was in the   range of 35% to 40%. Mild hypokinesis of the apical myocardium;   consistent with ischemia in the distribution of the left anterior   descending coronary artery. Severe hypokinesis and scarring of   the basal-midinferolateral and inferior myocardium; consistent   with infarction in the distribution of the right coronary or left   circumflex coronary artery. Doppler parameters are consistent   with abnormal left ventricular relaxation (grade 1 diastolic   dysfunction). Doppler parameters are consistent with elevated   mean left atrial filling pressure. - Aortic valve: A stent-valve (TAVR) bioprosthesis was present and   functioning normally. There was trivial regurgitation. Valve area   (VTI): 2.81 cm^2. Valve area (Vmax): 2.97 cm^2. Valve area   (Vmean): 2.85 cm^2. - Mitral valve: There was mild regurgitation. - Left atrium: The atrium was mildly dilated. - Right atrium: The atrium was mildly dilated.  Impressions:  - Left ventricular systolic function has improved compared to   October 2017 report.  Patient Profile     83 y.o. male with history of severe AS s/p TAVR in 2014, CAD s/p CABG in 1999 with PCI in 2004, HTN, HLD, PAF and ischemic cardiomyopathy with chronic systolic CHF admitted with weakness and found to  have elevated troponin c/w NSTEMI  Assessment & Plan    1. CAD/NSTEMI: Pt admitted with weakness and found to have a NSTEMI. Cardiac cath with occluded left coronary system and severe  disease in the proximal RCA. The graft to the RCA is chronically occluded. The graft to the OM appears to be the culprit vessel with thrombus in the graft. This graft does not appear to be a target for PCI. The vein graft to the intermediate branch has severe diffuse disease and is not a target for PCI. The LIMA graft to the LAD is patent. There are no good options for PCI. Discussed with Son yesterday afternoon. --Will continue medical therapy with ASA, statin, Plavix, beta blocker, Norvasc. Stop heparin today   2. Aortic stenosis: he is s/p TAVR in 2014. Stable valve placement on echo.  3. Ischemic cardiomyopathy: He has been known to have an ischemic cardiomyopathy. Plan to optimize medications.  -- Will add losartan 25mg  daily  4. Chronic systolic CHF/Elevated BNP: Appears volume stable on exam, though weight is trending up, net +. May benefit from lasix. Will reassess volume status in the am.   Dispo: he is very deconditioned overall. Lives at home with his son who reports he was using a walker at home. Will ask for PT consult today.   Signed, , NP  02/15/2017, 10:55 AM  Pager # [phone]   For questions or updates, please contact [facility] Please consult [email or web address] for contact info under Cardiology/STEMI.  I have personally seen and examined this patient with [phone], NP. I agree with the assessment and plan as outlined above. He is still weak. We will attempt to work with PT and will optimize medical therapy. I suspect that his functional status will be limited. Will reassess volume status tomorrow. May need to consider palliation.   Laverda Page 02/15/2017 12:52 PM

## 2017-02-16 ENCOUNTER — Other Ambulatory Visit: Payer: Self-pay

## 2017-02-16 DIAGNOSIS — E871 Hypo-osmolality and hyponatremia: Secondary | ICD-10-CM

## 2017-02-16 LAB — CBC
HCT: 31.1 % — ABNORMAL LOW (ref 39.0–52.0)
Hemoglobin: 9.9 g/dL — ABNORMAL LOW (ref 13.0–17.0)
MCH: 27.4 pg (ref 26.0–34.0)
MCHC: 31.8 g/dL (ref 30.0–36.0)
MCV: 86.1 fL (ref 78.0–100.0)
PLATELETS: 123 10*3/uL — AB (ref 150–400)
RBC: 3.61 MIL/uL — AB (ref 4.22–5.81)
RDW: 15.8 % — ABNORMAL HIGH (ref 11.5–15.5)
WBC: 7.9 10*3/uL (ref 4.0–10.5)

## 2017-02-16 MED ORDER — INFLUENZA VAC SPLIT HIGH-DOSE 0.5 ML IM SUSY
0.5000 mL | PREFILLED_SYRINGE | INTRAMUSCULAR | Status: AC
Start: 2017-02-17 — End: 2017-02-17
  Administered 2017-02-17: 0.5 mL via INTRAMUSCULAR
  Filled 2017-02-16: qty 0.5

## 2017-02-16 NOTE — Progress Notes (Signed)
Progress Note  Patient Name: Hunter Choi Date of Encounter: 02/16/2017  Primary Cardiologist: Verdis Prime  Subjective   Frail and deconditioned.  Lying flat in bed.  Denies shortness of breath and chest pain.  Difficult conversation as the patient is extremely hard of hearing.  Inpatient Medications    Scheduled Meds: . amLODipine  5 mg Oral Daily  . aspirin EC  81 mg Oral Daily  . carvedilol  6.25 mg Oral BID WC  . clopidogrel  75 mg Oral Daily  . fluticasone  1 spray Each Nare Daily  . folic acid  3 mg Oral Daily  . [START ON 02/17/2017] Influenza vac split quadrivalent PF  0.5 mL Intramuscular Tomorrow-1000  . lactose free nutrition  237 mL Oral Q24H  . levothyroxine  50 mcg Oral QAC breakfast  . loratadine  10 mg Oral Daily  . losartan  25 mg Oral Daily  . Melatonin  3 mg Oral QHS  . [START ON 02/17/2017] methotrexate  25 mg Oral Q Fri  . pantoprazole  40 mg Oral Daily  . predniSONE  5 mg Oral Q breakfast  . simvastatin  20 mg Oral QPM  . sodium chloride flush  3 mL Intravenous Q12H  . sodium chloride flush  3 mL Intravenous Q12H   Continuous Infusions: . sodium chloride    . sodium chloride     PRN Meds: sodium chloride, sodium chloride, acetaminophen, nitroGLYCERIN, ondansetron (ZOFRAN) IV, sodium chloride flush, sodium chloride flush   Vital Signs    Vitals:   02/15/17 1200 02/15/17 2109 02/16/17 0500 02/16/17 0600  BP: (!) 138/110 (!) 138/59  (!) 133/49  Pulse: 61 72  60  Resp: 18 16  14   Temp: 99 F (37.2 C) 99.5 F (37.5 C)  99 F (37.2 C)  TempSrc: Oral Oral  Oral  SpO2: 96% 98%  94%  Weight:   156 lb (70.8 kg)   Height:        Intake/Output Summary (Last 24 hours) at 02/16/2017 0800 Last data filed at 02/16/2017 0600 Gross per 24 hour  Intake 998.95 ml  Output 1551 ml  Net -552.05 ml   Filed Weights   02/13/17 0500 02/15/17 0500 02/16/17 0500  Weight: 153 lb 12.8 oz (69.8 kg) 156 lb 4.9 oz (70.9 kg) 156 lb (70.8 kg)    Telemetry      Sinus rhythm with PVCs.  Wide-complex compatible with previously known left bundle- Personally Reviewed  ECG    02/14/2017 tracing reveals left bundle with occasional PVCs and first-degree AV block- Personally Reviewed  Physical Exam  Frail but comfortable GEN: No acute distress.   Neck: No JVD Cardiac: RRR, no murmurs, rubs.  S3/S4 gallops.  Respiratory: Clear to auscultation bilaterally. GI: Soft, nontender, non-distended  MS: No edema; No deformity. Neuro:  Nonfocal  Psych: Normal affect   Labs    Chemistry Recent Labs  Lab 02/12/17 1206 02/13/17 0522 02/14/17 0005 02/15/17 0452  NA  --  130* 125* 125*  K  --  4.4 4.0 3.8  CL  --  99* 94* 95*  CO2  --  23 22 22   GLUCOSE  --  85 139* 104*  BUN  --  15 22* 26*  CREATININE  --  0.91 1.31* 1.07  CALCIUM  --  8.4* 8.0* 7.7*  PROT 7.7  --   --   --   ALBUMIN 3.0*  --   --   --   AST 14*  --   --   --  ALT 13*  --   --   --   ALKPHOS 154*  --   --   --   BILITOT 0.3  --   --   --   GFRNONAA  --  >60 47* >60  GFRAA  --  >60 55* >60  ANIONGAP  --  8 9 8      Hematology Recent Labs  Lab 02/14/17 0005 02/15/17 0452 02/16/17 0350  WBC 9.7 7.8 7.9  RBC 4.18* 3.67* 3.61*  HGB 11.6* 10.3* 9.9*  HCT 36.9* 32.0* 31.1*  MCV 88.3 87.2 86.1  MCH 27.8 28.1 27.4  MCHC 31.4 32.2 31.8  RDW 16.3* 16.3* 15.8*  PLT 158 141* 123*    Cardiac Enzymes Recent Labs  Lab 02/13/17 1252 02/13/17 1828 02/14/17 0005  TROPONINI 18.39* 15.00* 14.24*    Recent Labs  Lab 02/12/17 1311  TROPIPOC 2.65*     BNP Recent Labs  Lab 02/13/17 0522  BNP 1,875.3*     DDimer No results for input(s): DDIMER in the last 168 hours.   Radiology    No results found.  Cardiac Studies   Echocardiogram 02/14/17: Study Conclusions  - Left ventricle: The cavity size was normal. Systolic function was   moderately reduced. The estimated ejection fraction was in the   range of 35% to 40%. Mild hypokinesis of the apical  myocardium;   consistent with ischemia in the distribution of the left anterior   descending coronary artery. Severe hypokinesis and scarring of   the basal-midinferolateral and inferior myocardium; consistent   with infarction in the distribution of the right coronary or left   circumflex coronary artery. Doppler parameters are consistent   with abnormal left ventricular relaxation (grade 1 diastolic   dysfunction). Doppler parameters are consistent with elevated   mean left atrial filling pressure. - Aortic valve: A stent-valve (TAVR) bioprosthesis was present and   functioning normally. There was trivial regurgitation. Valve area   (VTI): 2.81 cm^2. Valve area (Vmax): 2.97 cm^2. Valve area   (Vmean): 2.85 cm^2. - Mitral valve: There was mild regurgitation. - Left atrium: The atrium was mildly dilated. - Right atrium: The atrium was mildly dilated.  Impressions:  - Left ventricular systolic function has improved compared to   October 2017 report.   CARDIAC catheterization 02/14/17: Coronary Diagrams   Diagnostic Diagram           Patient Profile     83 y.o. male with history of severe AS s/p TAVR in 2014, CAD s/p CABG in 1999 with PCI in 2004, HTN, HLD, PAF and ischemic cardiomyopathy with chronic systolic CHF admitted with weakness and found to have elevated troponinc/w NSTEMI     Assessment & Plan    1.  Acute on chronic combined systolic and diastolic heart failure due to ischemic cardiomyopathy with EF 30-35%, improved. 2.  Non-ST elevation myocardial infarction related to the degenerative saphenous vein graft disease.  Plan is medical therapy given the high likelihood of graft salvage given the diffuse nature and age of the graft. 3.  Status post T AVR with normally functioning aortic prosthesis 4.  Elderly, frail, and deconditioned.  Awaiting skilled nursing facility for rehab before returning home.  Need to make sure that case management is aware of  plan. 5. Hyponatremia, has been relatively chronic but needs to be followed.  For questions or updates, please contact [facility] Please consult [email or web address] for contact info under Cardiology/STEMI.      Signed, 2005 III,  MD  02/16/2017, 8:00 AM

## 2017-02-16 NOTE — Plan of Care (Signed)
  Progressing Clinical Measurements: Ability to maintain clinical measurements within normal limits will improve 02/16/2017 1037 - Progressing by Loel Lofty, RN Will remain free from infection 02/16/2017 1037 - Progressing by Loel Lofty, RN Diagnostic test results will improve 02/16/2017 1037 - Progressing by Loel Lofty, RN Respiratory complications will improve 02/16/2017 1037 - Progressing by Loel Lofty, RN Cardiovascular complication will be avoided 02/16/2017 1037 - Progressing by Loel Lofty, RN

## 2017-02-16 NOTE — Plan of Care (Signed)
Pt. Progresssing. Still unable to stand without assistance.

## 2017-02-17 ENCOUNTER — Other Ambulatory Visit: Payer: Self-pay | Admitting: Physician Assistant

## 2017-02-17 DIAGNOSIS — E871 Hypo-osmolality and hyponatremia: Secondary | ICD-10-CM

## 2017-02-17 DIAGNOSIS — D649 Anemia, unspecified: Secondary | ICD-10-CM

## 2017-02-17 LAB — CBC
HCT: 31.5 % — ABNORMAL LOW (ref 39.0–52.0)
HEMOGLOBIN: 10 g/dL — AB (ref 13.0–17.0)
MCH: 27.7 pg (ref 26.0–34.0)
MCHC: 31.7 g/dL (ref 30.0–36.0)
MCV: 87.3 fL (ref 78.0–100.0)
PLATELETS: 149 10*3/uL — AB (ref 150–400)
RBC: 3.61 MIL/uL — AB (ref 4.22–5.81)
RDW: 16 % — ABNORMAL HIGH (ref 11.5–15.5)
WBC: 8.4 10*3/uL (ref 4.0–10.5)

## 2017-02-17 MED ORDER — LOSARTAN POTASSIUM 25 MG PO TABS: 25.0000 mg | ORAL_TABLET | Freq: Every day | ORAL | 3 refills | Status: DC

## 2017-02-17 MED ORDER — PANTOPRAZOLE SODIUM 40 MG PO TBEC: 40.0000 mg | DELAYED_RELEASE_TABLET | Freq: Every day | ORAL | 3 refills | Status: DC

## 2017-02-17 MED ORDER — CLOPIDOGREL BISULFATE 75 MG PO TABS: 75.0000 mg | ORAL_TABLET | Freq: Every day | ORAL | 3 refills | Status: DC

## 2017-02-17 NOTE — Progress Notes (Signed)
Clinical Social Worker facilitated patient discharge including contacting patient family and facility to confirm patient discharge plans.  Clinical information faxed to facility and family agreeable with plan.  CSW arranged ambulance transport via [facility] to [facility] .  RN Melva to call [phone] (pt will go in room 119A)  for report prior to discharge.  Clinical Social Worker will sign off for now as social work intervention is no longer needed. Please consult [place] again if new need arises.  Marrianne Mood, MSW, [employer] [phone]

## 2017-02-17 NOTE — Progress Notes (Signed)
Pt transported to [facility] health care stable with no distress. Report given to the Nurse  Birdena Jubilee at [facility] health.

## 2017-02-17 NOTE — [place] FL2 (Signed)
[place] MEDICAID FL2 LEVEL OF CARE SCREENING TOOL     IDENTIFICATION  Patient Name: Hunter Choi Birthdate: [birth year] Sex: male Admission Date (Current Location): 02/12/2017  [facility] and [place] Number:  Producer, television/film/video and Address:  [facility], [address], [place], [place] [address]      Provider Number: [id number]  Attending Physician Name and Address:  Chrystie Nose, MD  Relative Name and Phone Number:  Braydyn, Schultes [phone]    Current Level of Care: Hospital Recommended Level of Care: Skilled Nursing Facility Prior Approval Number:    Date Approved/Denied:   PASRR Number: [id number]  Discharge Plan: SNF    Current Diagnoses: Patient Active Problem List   Diagnosis Date Noted  . NSTEMI (non-ST elevated myocardial infarction) (HCC) 02/12/2017  . Sepsis due to urinary tract infection (HCC) 10/02/2015  . Hard of hearing   . Pressure ulcer 01/28/2015  . Chronic cholecystitis 01/27/2015  . Chronic combined systolic and diastolic congestive heart failure (HCC) 01/27/2015  . Elevated LFTs 11/14/2014  . Anemia 05/23/2014  . Microscopic hematuria 05/23/2014  . Thrombocytopenia (HCC) 02/21/2014  . Malnutrition of moderate degree (HCC) 02/21/2014  . Occlusion and stenosis of carotid artery without mention of cerebral infarction 09/04/2013  . Aftercare following surgery of the circulatory system, NEC 09/04/2013  . AAA (abdominal aortic aneurysm) without rupture (HCC) 03/06/2013  . 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 Coronary artery disease involving coronary bypass graft of native heart with angina pectoris (HCC)   . Hypertensive heart disease with CHF (HCC)   . GERD (gastroesophageal reflux disease)   . Thyroid disease   . Embolism involving retinal artery   . BPH (benign prostatic hypertrophy)   . Iliac artery aneurysm (HCC)   . Rheumatoid arthritis (HCC) 04/22/2010  . PULMONARY FUNCTION  TESTS, ABNORMAL 04/22/2010    Orientation RESPIRATION BLADDER Height & Weight     Time, Situation, Place, Self  Normal External catheter Weight: 154 lb 8.7 oz (70.1 kg)(bed scale) Height:  5\' 7"  (170.2 cm)  BEHAVIORAL SYMPTOMS/MOOD NEUROLOGICAL BOWEL NUTRITION STATUS      Continent Diet(heart room)  AMBULATORY STATUS COMMUNICATION OF NEEDS Skin   Limited Assist Verbally Normal                       Personal Care Assistance Level of Assistance  Bathing, Feeding, Dressing Bathing Assistance: Limited assistance Feeding assistance: Independent Dressing Assistance: Limited assistance     Functional Limitations Info  Sight, Hearing, Speech Sight Info: Impaired(blind left eye) Hearing Info: Impaired Speech Info: Adequate    SPECIAL CARE FACTORS FREQUENCY  PT (By licensed PT), OT (By licensed OT)     PT Frequency: 5x wk OT Frequency: 5x wk            Contractures Contractures Info: Not present    Additional Factors Info  Allergies, Code Status Code Status Info: Full Code Allergies Info: CIPROFLOXACIN           Current Medications (02/17/2017):  This is the current hospital active medication list Current Facility-Administered Medications  Medication Dose Route Frequency Provider Last Rate Last Dose  . 0.9 %  sodium chloride infusion  250 mL Intravenous PRN Hilty, 02/19/2017, MD      . 0.9 %  sodium chloride infusion  250 mL Intravenous PRN Lisette Abu, MD      . acetaminophen (TYLENOL) tablet 650 mg  650 mg Oral Q6H  PRN Chrystie Nose, MD   650 mg at 02/13/17 1717  . amLODipine (NORVASC) tablet 5 mg  5 mg Oral Daily Chrystie Nose, MD   5 mg at 02/17/17 0907  . aspirin EC tablet 81 mg  81 mg Oral Daily Chrystie Nose, MD   81 mg at 02/17/17 6389  . carvedilol (COREG) tablet 6.25 mg  6.25 mg Oral BID WC Chrystie Nose, MD   6.25 mg at 02/17/17 0759  . clopidogrel (PLAVIX) tablet 75 mg  75 mg Oral Daily Marykay Lex, MD   75 mg at 02/17/17 0908  .  fluticasone (FLONASE) 50 MCG/ACT nasal spray 1 spray  1 spray Each Nare Daily Chrystie Nose, MD   1 spray at 02/17/17 0908  . folic acid (FOLVITE) tablet 3 mg  3 mg Oral Daily Chrystie Nose, MD   3 mg at 02/17/17 0907  . Influenza vac split quadrivalent PF (FLUZONE HIGH-DOSE) injection 0.5 mL  0.5 mL Intramuscular Tomorrow-1000 Hilty, Lisette Abu, MD      . lactose free nutrition (Boost) liquid 237 mL  237 mL Oral Q24H Chrystie Nose, MD   237 mL at 02/16/17 2312  . levothyroxine (SYNTHROID, LEVOTHROID) tablet 50 mcg  50 mcg Oral QAC breakfast Chrystie Nose, MD   50 mcg at 02/17/17 0603  . loratadine (CLARITIN) tablet 10 mg  10 mg Oral Daily Chrystie Nose, MD   10 mg at 02/17/17 0907  . losartan (COZAAR) tablet 25 mg  25 mg Oral Daily Laverda Page B, NP   25 mg at 02/17/17 0907  . Melatonin TABS 3 mg  3 mg Oral QHS Hilty, Lisette Abu, MD   3 mg at 02/15/17 2257  . methotrexate (RHEUMATREX) tablet 25 mg  25 mg Oral Q Crissie Sickles, MD   25 mg at 02/17/17 3734  . nitroGLYCERIN (NITROSTAT) SL tablet 0.4 mg  0.4 mg Sublingual Q5 Min x 3 PRN Chrystie Nose, MD      . ondansetron [facility]) injection 4 mg  4 mg Intravenous Q6H PRN Marykay Lex, MD   4 mg at 02/17/17 1102  . pantoprazole (PROTONIX) EC tablet 40 mg  40 mg Oral Daily Chrystie Nose, MD   40 mg at 02/17/17 2876  . predniSONE (DELTASONE) tablet 5 mg  5 mg Oral Q breakfast Hilty, Lisette Abu, MD   5 mg at 02/17/17 0759  . simvastatin (ZOCOR) tablet 20 mg  20 mg Oral QPM Hilty, Lisette Abu, MD   20 mg at 02/16/17 1736  . sodium chloride flush (NS) 0.9 % injection 3 mL  3 mL Intravenous Q12H Chrystie Nose, MD   3 mL at 02/16/17 0936  . sodium chloride flush (NS) 0.9 % injection 3 mL  3 mL Intravenous PRN Hilty, Lisette Abu, MD      . sodium chloride flush (NS) 0.9 % injection 3 mL  3 mL Intravenous Q12H Marykay Lex, MD   3 mL at 02/16/17 2126  . sodium chloride flush (NS) 0.9 % injection 3 mL  3 mL Intravenous PRN  Marykay Lex, MD         Discharge Medications: Please see discharge summary for a list of discharge medications.  Relevant Imaging Results:  Relevant Lab Results:   Additional Information SS#[id number]  Althea Charon, LCSW

## 2017-02-17 NOTE — Progress Notes (Signed)
Progress Note  Patient Name: Hunter Choi Date of Encounter: 02/17/2017  Primary Cardiologist: Dr. Katrinka Blazing  Subjective   Hard of hearing, but can read. Says he is sore all over except chest, mainly in the shoulders. Denies any SOB.   Inpatient Medications    Scheduled Meds: . amLODipine  5 mg Oral Daily  . aspirin EC  81 mg Oral Daily  . carvedilol  6.25 mg Oral BID WC  . clopidogrel  75 mg Oral Daily  . fluticasone  1 spray Each Nare Daily  . folic acid  3 mg Oral Daily  . Influenza vac split quadrivalent PF  0.5 mL Intramuscular Tomorrow-1000  . lactose free nutrition  237 mL Oral Q24H  . levothyroxine  50 mcg Oral QAC breakfast  . loratadine  10 mg Oral Daily  . losartan  25 mg Oral Daily  . Melatonin  3 mg Oral QHS  . methotrexate  25 mg Oral Q Fri  . pantoprazole  40 mg Oral Daily  . predniSONE  5 mg Oral Q breakfast  . simvastatin  20 mg Oral QPM  . sodium chloride flush  3 mL Intravenous Q12H  . sodium chloride flush  3 mL Intravenous Q12H   Continuous Infusions: . sodium chloride    . sodium chloride     PRN Meds: sodium chloride, sodium chloride, acetaminophen, nitroGLYCERIN, ondansetron (ZOFRAN) IV, sodium chloride flush, sodium chloride flush   Vital Signs    Vitals:   02/16/17 0600 02/16/17 1400 02/16/17 2044 02/17/17 0611  BP: (!) 133/49 125/62 (!) 135/53 140/62  Pulse: 60 98 63 61  Resp: 14 20 17    Temp: 99 F (37.2 C) 98.5 F (36.9 C) 99.2 F (37.3 C) 98.7 F (37.1 C)  TempSrc: Oral Oral Oral Oral  SpO2: 94% 98% 100% 97%  Weight:    154 lb 8.7 oz (70.1 kg)  Height:        Intake/Output Summary (Last 24 hours) at 02/17/2017 0754 Last data filed at 02/17/2017 02/19/2017 Gross per 24 hour  Intake 895 ml  Output 1800 ml  Net -905 ml   Filed Weights   02/15/17 0500 02/16/17 0500 02/17/17 0611  Weight: 156 lb 4.9 oz (70.9 kg) 156 lb (70.8 kg) 154 lb 8.7 oz (70.1 kg)    Telemetry    Sinus rhythm with LBBB - Personally Reviewed  ECG      Sinus rhythm with 1st degree AV block and LBBB - Personally Reviewed  Physical Exam   GEN: No acute distress.   Neck: No JVD Cardiac: RRR, no murmurs, rubs, or gallops.  Respiratory: +bibasilar crackles GI: Soft, nontender, non-distended  MS: No edema; No deformity. Neuro:  Nonfocal  Psych: Normal affect   Labs    Chemistry Recent Labs  Lab 02/12/17 1206 02/13/17 0522 02/14/17 0005 02/15/17 0452  NA  --  130* 125* 125*  K  --  4.4 4.0 3.8  CL  --  99* 94* 95*  CO2  --  23 22 22   GLUCOSE  --  85 139* 104*  BUN  --  15 22* 26*  CREATININE  --  0.91 1.31* 1.07  CALCIUM  --  8.4* 8.0* 7.7*  PROT 7.7  --   --   --   ALBUMIN 3.0*  --   --   --   AST 14*  --   --   --   ALT 13*  --   --   --  ALKPHOS 154*  --   --   --   BILITOT 0.3  --   --   --   GFRNONAA  --  >60 47* >60  GFRAA  --  >60 55* >60  ANIONGAP  --  8 9 8      Hematology Recent Labs  Lab 02/15/17 0452 02/16/17 0350 02/17/17 0428  WBC 7.8 7.9 8.4  RBC 3.67* 3.61* 3.61*  HGB 10.3* 9.9* 10.0*  HCT 32.0* 31.1* 31.5*  MCV 87.2 86.1 87.3  MCH 28.1 27.4 27.7  MCHC 32.2 31.8 31.7  RDW 16.3* 15.8* 16.0*  PLT 141* 123* 149*    Cardiac Enzymes Recent Labs  Lab 02/13/17 1252 02/13/17 1828 02/14/17 0005  TROPONINI 18.39* 15.00* 14.24*    Recent Labs  Lab 02/12/17 1311  TROPIPOC 2.65*     BNP Recent Labs  Lab 02/13/17 0522  BNP 1,875.3*     DDimer No results for input(s): DDIMER in the last 168 hours.   Radiology    No results found.  Cardiac Studies   Cardiac cath 02/14/17:  Mid LM to Ost LAD lesion is 65% stenosed.  Ost Cx to Prox Cx lesion is 90% stenosed. Mid Cx lesion is 100% stenosed.  Ost LAD to Prox LAD lesion is 99% stenosed. Prox LAD to Mid LAD lesion is 100% stenosed.  LIMA-LAD graft is widely patent  Ost 1st Diag lesion is 99% stenosed.  SVG-Diag1/Ramus -very large, patulous/ectatic vessel. The grafted vessel is smooth without disease.  SVG-OM - prox Graft  lesion is 70% focal, thrombotic lesion in a valve segment.  Prox RCA lesion is 75% stenosed. Dist RCA lesion is 100% stenosed with 100% stenosed side branch in Post Atrio.  SVG-distal RCA origin lesion is 100% stenosed.  There is severe left ventricular systolic dysfunction.  LV end diastolic pressure is moderately elevated.  The left ventricular ejection fraction is 25-35% by visual estimate.   TTE: 02/14/17  Study Conclusions  - Left ventricle: The cavity size was normal. Systolic function was moderately reduced. The estimated ejection fraction was in the range of 35% to 40%. Mild hypokinesis of the apical myocardium; consistent with ischemia in the distribution of the left anterior descending coronary artery. Severe hypokinesis and scarring of the basal-midinferolateral and inferior myocardium; consistent with infarction in the distribution of the right coronary or left circumflex coronary artery. Doppler parameters are consistent with abnormal left ventricular relaxation (grade 1 diastolic dysfunction). Doppler parameters are consistent with elevated mean left atrial filling pressure. - Aortic valve: A stent-valve (TAVR) bioprosthesis was present and functioning normally. There was trivial regurgitation. Valve area (VTI): 2.81 cm^2. Valve area (Vmax): 2.97 cm^2. Valve area (Vmean): 2.85 cm^2. - Mitral valve: There was mild regurgitation. - Left atrium: The atrium was mildly dilated. - Right atrium: The atrium was mildly dilated.  Impressions:  - Left ventricular systolic function has improved compared to October 2017 report.   Patient Profile     83 y.o. male with PMH of severe AS s/p TAVR in 2014, CAD s/p CABG 1999 with PCI in 2004, HTN, HLD, PAF and ICM presented with weakness and found to have elevated trop. Cath 02/14/2017 showed severe native CAD with degenerative graft, medical therapy recommended. EF on echo improved to 35%  compare to 2017. Seen by PT, waiting for discharge once SNF available.   Assessment & Plan    1. Acute on chronic combined systolic and diastolic HF: Echo 02/14/2017 EF 35-40%, improved from 2017.   - continue  to have crackles in bilateral bases of lung, however more likely to be atelectasis. Does not appears to be volume overloaded based on physical exam. I think he is stable for discharge, will discuss with Child psychotherapist.   2. CAD with NSTEMI: known occluded SVG to RPDA. Cath 02/14/2017 severe native CAD with degenerative SVG to Diag/RI, felt to be high risk for intervention, plan for medical therapy instead. Started on plavix. ASA. Plavix, coreg, losartan, amlodipine and Zocor.   3. Severe AS s/p TAVR: stable on echo  4. Deconditioning: waiting for discharge to SNF  5. Chronic hyponatremia: Na 125 on 02/15/2017, chronically low.   For questions or updates, please contact [facility] Please consult [email or web address] for contact info under Cardiology/STEMI.      Ramond Dial, PA  02/17/2017, 7:54 AM    I have personally seen and examined this patient. I agree with the assessment and plan as outlined above. He is stable this am. No chest pain. Plan medical management of CAD. Volume status is good this am. Will plan discharge to SNF today.   Verne Carrow 02/17/2017 11:52 AM

## 2017-02-17 NOTE — Clinical Social Work Note (Signed)
Clinical Social Work Assessment  Patient Details  Name: Hunter Choi MRN: [id number] Date of Birth: 02/20/1928  Date of referral:  02/17/17               Reason for consult:  Discharge Planning                Permission sought to share information with:  Family Supports Permission granted to share information::  Yes, Verbal Permission Granted  Name::     [place]::  snf  Relationship::  [phone]  Contact Information:  son  Housing/Transportation Living arrangements for the past 2 months:  Single Family Home Source of Information:  Adult Children Patient Interpreter Needed:  None, Other (Comment Required)(extemely hard of hearing) Criminal Activity/Legal Involvement Pertinent to Current Situation/Hospitalization:  No - Comment as needed Significant Relationships:  Friend Lives with:  Adult Children Do you feel safe going back to the place where you live?  Yes Need for family participation in patient care:  Yes (Comment)  Care giving concerns:  No family at bedside. Patient has support from son. Per patients son patient lives with him and he is his care giver.   Social Worker assessment / plan:  CSW met patient at bedside but was unable to assess patient due to patient being extremely hard of hearing. CSW contacted pt's son Hunter Choi via phone.ad stated he is in agreement with pt discharging to SNF for short term rehab. Hunter Choi has requested that patient will be placed's at [facility].  CSW contacted facility to see if they have availability for patient today. CSW to follow up with family once bed offers are confirm    Employment status:  Retired Nurse, adult PT Recommendations:  [place] / Referral to community resources:  [place]  Patient/Family's Response to care:  Family very supportive of patients needs.  Patient/Family's Understanding of and Emotional Response to Diagnosis, Current  Treatment, and Prognosis:  Family has clear understanding of patients recent hospitalization and limitations   Emotional Assessment Appearance:  Appears stated age Attitude/Demeanor/Rapport:  Unable to Assess Affect (typically observed):  Unable to Assess Orientation:  Oriented to Self, Oriented to Situation, Oriented to Place, Oriented to  Time Alcohol / Substance use:  Not Applicable Psych involvement (Current and /or in the community):  No (Comment)  Discharge Needs  Concerns to be addressed:  No discharge needs identified Readmission within the last 30 days:  No Current discharge risk:  None Barriers to Discharge:  No Barriers Identified   Wende Neighbors, LCSW 02/17/2017, 12:15 PM

## 2017-02-17 NOTE — Discharge Summary (Signed)
Discharge Summary    Patient ID: Hunter Choi,  MRN: [id number], DOB/AGE: 07-24-1927 83 y.o.  Admit date: 02/12/2017 Discharge date: 02/17/2017  Primary Care Provider: Leia Alf (Inactive) Primary Cardiologist: Dr. Katrinka Blazing  Discharge Diagnoses    Principal Problem:   NSTEMI (non-ST elevated myocardial infarction) [facility]) Active Problems:   Hypertensive heart disease with CHF (HCC)   Coronary artery disease involving coronary bypass graft of native heart with angina pectoris (HCC)   S/P TAVR (transcatheter aortic valve replacement)   Anemia   Hyponatremia   Chronic combined systolic and diastolic congestive heart failure (HCC)   Hard of hearing   Allergies Allergies  Allergen Reactions  . Ciprofloxacin Other (See Comments)    REACTION: mild delirium    Diagnostic Studies/Procedures    Echo 02/14/2017 LV EF: 35% -   40%  Study Conclusions  - Left ventricle: The cavity size was normal. Systolic function was   moderately reduced. The estimated ejection fraction was in the   range of 35% to 40%. Mild hypokinesis of the apical myocardium;   consistent with ischemia in the distribution of the left anterior   descending coronary artery. Severe hypokinesis and scarring of   the basal-midinferolateral and inferior myocardium; consistent   with infarction in the distribution of the right coronary or left   circumflex coronary artery. Doppler parameters are consistent   with abnormal left ventricular relaxation (grade 1 diastolic   dysfunction). Doppler parameters are consistent with elevated   mean left atrial filling pressure. - Aortic valve: A stent-valve (TAVR) bioprosthesis was present and   functioning normally. There was trivial regurgitation. Valve area   (VTI): 2.81 cm^2. Valve area (Vmax): 2.97 cm^2. Valve area   (Vmean): 2.85 cm^2. - Mitral valve: There was mild regurgitation. - Left atrium: The atrium was mildly dilated. - Right atrium: The  atrium was mildly dilated.  Impressions:  - Left ventricular systolic function has improved compared to   October 2017 report.    Cath 02/14/2017 Conclusion     Mid LM to Ost LAD lesion is 65% stenosed.  Ost Cx to Prox Cx lesion is 90% stenosed. Mid Cx lesion is 100% stenosed.  Ost LAD to Prox LAD lesion is 99% stenosed. Prox LAD to Mid LAD lesion is 100% stenosed.  LIMA-LAD graft is widely patent  Ost 1st Diag lesion is 99% stenosed.  SVG-Diag1/Ramus -very large, patulous/ectatic vessel. The grafted vessel is smooth without disease.  SVG-OM - prox Graft lesion is 70% focal, thrombotic lesion in a valve segment.  Prox RCA lesion is 75% stenosed. Dist RCA lesion is 100% stenosed with 100% stenosed side branch in Post Atrio.  SVG-distal RCA origin lesion is 100% stenosed.  There is severe left ventricular systolic dysfunction.  LV end diastolic pressure is moderately elevated.  The left ventricular ejection fraction is 25-35% by visual estimate.   The patient has severe native vessel disease with essentially no native Left Coronary Artery vessels remaining patent.  The native RCA has extensive disease in the proximal segment and then appears to be occluded at the bifurcation of the posterolateral branch and PDA.  The graft to the RPDA is known to be occluded. There are 2 remaining vein grafts are patent, however the vein graft to OM1 focal thrombotic lesion and what appears to be a graft.  The remainder of this graft as well as the SVG-Diag/RI are very patulous/ectatic vessels almost aneurysmal.  Intervention on a 80 year old vein graft with this much  disease is likely fraught with high risk for complications.  Given his extreme cardiomyopathy, I fear that attempted PCI of this vessel with potential occlusion of the graft and the downstream vessel would lead to life-threatening hemodynamic instability.  I reviewed the images with Dr. Excell Seltzer, he and I both believe that the  graft is too far disease to risk attempted PCI.  Therefore the plan will be to return the patient to the nursing unit, run heparin for this 48 hours and continue current medical management with addition of Plavix.  Plan: The patient will move to the holding area for sheath to be removed with manual pressure. Restart IV heparin 8 hours after sheath removal. I have little with 300 mg Plavix today and then starts in her milligrams Plavix tomorrow. Based on patient's symptoms, would need to titrate antianginal as well as heart failure medications.  Unfortunately, I was not able to get much of a story from the patient today and would prefer for this to be managed by the primary team.   _____________   History of Present Illness     Hunter Choi is a 83 y.o. 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 He now presents with weakness which is been present for a day or 2. He denies any chest pain. He reports no worsening shortness of breath. On admission a troponin was checked and elevated at 2.65. EKG shows sinus rhythm at 62, IVCD with a left bundle branch pattern, LVH and lateral T-wave inversion suggestive of possible ischemia. Findings are similar to his EKG in April 2018. When he saw Dr. Katrinka Blazing last at that time in the office, his most recent LVEF had dropped to less than 30%. He was clinically stable without anginal symptoms and no medication changes were made.     Hospital Course     Consultants: Physical therapist   During this hospitalization, initial point-of-care troponin was elevated at 2.65, troponin  subsequently peaked at 18.39 before turning down. BNP was elevated 1875. CT of the head obtained on 02/12/2017 showed chronic small vessel disease with atrophy, however no acute intracranial abnormality. Urine drug screen negative. He eventually underwent cardiac catheterization on 02/14/2017, this revealed severe native vessel disease with known occluded SVG to RPDA. Of the remaining 2 vein grafts, both appears degenerated which make it very high risk for intervention. After discussing with the interventional team, it was decided to pursue medical therapy instead. IV heparin continued to run for 48 hours after elevation in troponin. He was also loaded on Plavix along with aspirin. Echocardiogram obtained on the following day showed ejection fraction 35-40%, grade 1DD, severe hypokinesis of the basal mid inferolateral and inferior myocardium, stable bioprosthetic aortic valve. Compared to 2017 echo, ejection fraction actually improved from the previous 25% up to 35%. Given his significant LV dysfunction, we added losartan to his medical regimen.  Due to his severe deconditioning, physical therapy was consulted on 02/15/2017 who recommended discharge to skilled nursing facility. He was seen the morning of 02/17/2017, at which time he denies any significant chest discomfort or shortness of breath. He does have bilateral basilar crackles on physical exam, this is consistent more with atelectasis instead  of interstitial edema. He does not appears to be volume overloaded on physical exam. He will need basic metabolic panel and CBC in one week. I have sent a staff message to cardiology scheduler to arrange 1 to 2 weeks outpatient visit. He will also need to be followed closely by his primary care provider given his chronic hyponatremia. Sodium on discharge was 125 which is stable for him.  _____________  Discharge Vitals Blood pressure (!) 119/44, pulse (!) 56, temperature 98.8 F (37.1 C), temperature source  Oral, resp. rate (!) 22, height 5\' 7"  (1.702 m), weight 154 lb 8.7 oz (70.1 kg), SpO2 99 %.  Filed Weights   02/15/17 0500 02/16/17 0500 02/17/17 0611  Weight: 156 lb 4.9 oz (70.9 kg) 156 lb (70.8 kg) 154 lb 8.7 oz (70.1 kg)    Labs & Radiologic Studies    CBC Recent Labs    02/16/17 0350 02/17/17 0428  WBC 7.9 8.4  HGB 9.9* 10.0*  HCT 31.1* 31.5*  MCV 86.1 87.3  PLT 123* 149*   Basic Metabolic Panel Recent Labs    35/68/61 0452  NA 125*  K 3.8  CL 95*  CO2 22  GLUCOSE 104*  BUN 26*  CREATININE 1.07  CALCIUM 7.7*   Liver Function Tests No results for input(s): AST, ALT, ALKPHOS, BILITOT, PROT, ALBUMIN in the last 72 hours. No results for input(s): LIPASE, AMYLASE in the last 72 hours. Cardiac Enzymes No results for input(s): CKTOTAL, CKMB, CKMBINDEX, TROPONINI in the last 72 hours. BNP Invalid input(s): POCBNP D-Dimer No results for input(s): DDIMER in the last 72 hours. Hemoglobin A1C No results for input(s): HGBA1C in the last 72 hours. Fasting Lipid Panel No results for input(s): CHOL, HDL, LDLCALC, TRIG, CHOLHDL, LDLDIRECT in the last 72 hours. Thyroid Function Tests No results for input(s): TSH, T4TOTAL, T3FREE, THYROIDAB in the last 72 hours.  Invalid input(s): FREET3 _____________  Dg Chest 2 View  Result Date: 02/12/2017 CLINICAL DATA:  Pt tried to ambulate out to his mailbox but could not due to general weakness PTA with Numbness to right hand and generalized weakness EXAM: CHEST - 2 VIEW COMPARISON:  10/02/2015 FINDINGS: Low lung volumes with resultant crowding of perihilar and bibasilar bronchovascular markings. Mild interstitial prominence bilaterally. No confluent airspace disease. Previous median sternotomy, CABG, AVR. Stable cardiomegaly. Atheromatous aorta. No effusion.  No pneumothorax. Surgical clips above the thoracic inlet on the right. Cholecystectomy clips. IMPRESSION: 1. Mild bilateral interstitial prominence, may be secondary to low lung  volumes. 2. Stable cardiomegaly and postop change. Electronically Signed   By: Corlis Leak M.D.   On: 02/12/2017 12:01   Ct Head Wo Contrast  Result Date: 02/12/2017 CLINICAL DATA:  Right hand numbness.  Generalized weakness. EXAM: CT HEAD WITHOUT CONTRAST TECHNIQUE: Contiguous axial images were obtained from the base of the skull through the vertex without intravenous contrast. COMPARISON:  None. FINDINGS: Brain: No acute intracranial abnormality. Specifically, no hemorrhage, hydrocephalus, mass lesion, acute infarction, or significant intracranial injury. 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 There is atrophy and chronic small vessel disease changes. Vascular: No hyperdense vessel or unexpected calcification. Skull: No acute calvarial abnormality. Sinuses/Orbits: Visualized paranasal sinuses and mastoids clear. Orbital soft tissues unremarkable. Other: None IMPRESSION: Atrophy, chronic small vessel disease. 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 No acute intracranial abnormality. Electronically Signed   By: Charlett Nose M.D.   On: 02/12/2017 12:40   Disposition   Pt is being discharged home today in good condition.  Follow-up Plans & Appointments     Contact information for follow-up providers    Leia Alf, MD. Schedule an appointment as soon as possible for a visit in 1 week(s).   Specialty:  Internal Medicine Why:  Please establish with a primary care physician and followup with primary care provider as soon as possible for chronically low potassium Contact information: [address] [place] [address] [phone]        Lyn Records, MD Follow up.   Specialty:  Cardiology Why:  office will contact you to arrange outpatient visit with Dr. Michaelle Copas PA/NP in the office in 1-2 weeks.  Contact information: [address] [place] [address] [phone]            Contact information for after-discharge  care    Destination    [facility] Follow up.   Service:  Skilled Nursing Contact information: [address] [place] [employer] [place] [address] [phone]                   Discharge Medications   Current Discharge Medication List    START taking these medications   Details  clopidogrel (PLAVIX) 75 MG tablet Take 1 tablet (75 mg total) by mouth daily. Qty: 90 tablet, Refills: 3    losartan (COZAAR) 25 MG tablet Take 1 tablet (25 mg total) by mouth daily. Qty: 90 tablet, Refills: 3    pantoprazole (PROTONIX) 40 MG tablet Take 1 tablet (40 mg total) by mouth daily. Qty: 90 tablet, Refills: 3      CONTINUE these medications which have NOT CHANGED   Details  acetaminophen (TYLENOL) 325 MG tablet Take 2 tablets (650 mg total) by mouth every 6 (six) hours as needed for mild pain, moderate pain, fever or headache.    amLODipine (NORVASC) 5 MG tablet Take 5 mg by mouth daily.    aspirin EC 81 MG EC tablet Take 1 tablet (81 mg total) by mouth daily.    carvedilol (COREG) 6.25 MG tablet Take 1 tablet (6.25 mg total) by mouth 2 (two) times daily with a meal. Qty: 60 tablet, Refills: 3    fluticasone (FLONASE) 50 MCG/ACT nasal spray Place 1 spray into both nostrils daily.     folic acid (FOLVITE) 1 MG tablet Take 3 mg by mouth daily.    lactose free nutrition (BOOST) LIQD Take 237 mLs daily by mouth.    levothyroxine (SYNTHROID, LEVOTHROID) 50 MCG tablet Take 50 mcg by mouth daily. Refills: 0    loratadine (CLARITIN) 10 MG tablet Take 10 mg by mouth daily. Refills: 1    MELATONIN PO Take 1 tablet by mouth daily.    methotrexate 2.5 MG tablet Take 25 mg by mouth every Friday.     predniSONE (DELTASONE) 5 MG tablet Take 5 mg by mouth daily. Refills: 3    simvastatin (ZOCOR) 20 MG tablet Take 20 mg by mouth every evening. Refills: 12    nitroGLYCERIN (NITROSTAT) 0.4 MG SL tablet PLACE 1 TABLET (0.4 MG TOTAL) UNDER THE TONGUE EVERY 5 MINUTES AS  NEEDED FOR CHEST PAIN Qty: 25 tablet, Refills: 5    ondansetron (ZOFRAN ODT) 4 MG disintegrating tablet Take 1 tablet (4 mg total) by mouth every 8 (eight) hours as needed for nausea or vomiting. Qty: 20 tablet, Refills: 0      STOP taking these medications     omeprazole (PRILOSEC) 20 MG capsule      traMADol (  ULTRAM) 50 MG tablet          Aspirin prescribed at discharge?  Yes High Intensity Statin Prescribed? (Lipitor 40-80mg  or Crestor 20-40mg ): Yes Beta Blocker Prescribed? Yes For EF <40%, was ACEI/ARB Prescribed? Yes ADP Receptor Inhibitor Prescribed? (i.e. Plavix etc.-Includes Medically Managed Patients): Yes For EF <40%, Aldosterone Inhibitor Prescribed? No: Arrange on followup Was EF assessed during THIS hospitalization? Yes Was Cardiac Rehab II ordered? (Included Medically managed Patients): No: no PCI, need PT   Outstanding Labs/Studies   CBC and BMET in 1 week   Duration of Discharge Encounter   Greater than 30 minutes including physician time.  Ramond DialSigned, Mally Gavina NP 02/17/2017, 3:09 PM

## 2017-03-22 ENCOUNTER — Telehealth: Payer: Self-pay | Admitting: Interventional Cardiology

## 2017-03-23 ENCOUNTER — Encounter: Payer: Self-pay | Admitting: Interventional Cardiology

## 2017-04-07 NOTE — Telephone Encounter (Signed)
close

## 2017-04-19 ENCOUNTER — Encounter: Payer: Self-pay | Admitting: Cardiology

## 2017-04-19 ENCOUNTER — Ambulatory Visit (INDEPENDENT_AMBULATORY_CARE_PROVIDER_SITE_OTHER): Payer: Medicare Other | Admitting: Cardiology

## 2017-04-19 VITALS — BP 108/50 | HR 63 | Resp 16 | Ht 66.0 in | Wt 150.8 lb

## 2017-04-19 DIAGNOSIS — Z952 Presence of prosthetic heart valve: Secondary | ICD-10-CM

## 2017-04-19 DIAGNOSIS — I214 Non-ST elevation (NSTEMI) myocardial infarction: Secondary | ICD-10-CM | POA: Diagnosis not present

## 2017-04-19 DIAGNOSIS — I5042 Chronic combined systolic (congestive) and diastolic (congestive) heart failure: Secondary | ICD-10-CM

## 2017-04-19 DIAGNOSIS — Z5181 Encounter for therapeutic drug level monitoring: Secondary | ICD-10-CM

## 2017-04-19 DIAGNOSIS — N183 Chronic kidney disease, stage 3 unspecified: Secondary | ICD-10-CM

## 2017-04-19 DIAGNOSIS — I723 Aneurysm of iliac artery: Secondary | ICD-10-CM

## 2017-04-19 DIAGNOSIS — I25709 Atherosclerosis of coronary artery bypass graft(s), unspecified, with unspecified angina pectoris: Secondary | ICD-10-CM

## 2017-04-19 LAB — BASIC METABOLIC PANEL
BUN/Creatinine Ratio: 27 — ABNORMAL HIGH (ref 10–24)
BUN: 29 mg/dL — AB (ref 8–27)
CO2: 23 mmol/L (ref 20–29)
CREATININE: 1.06 mg/dL (ref 0.76–1.27)
Calcium: 8.8 mg/dL (ref 8.6–10.2)
Chloride: 97 mmol/L (ref 96–106)
GFR, EST AFRICAN AMERICAN: 73 mL/min/{1.73_m2} (ref >59)
GFR, EST NON AFRICAN AMERICAN: 63 mL/min/{1.73_m2} (ref >59)
Glucose: 119 mg/dL — ABNORMAL HIGH (ref 65–99)
Potassium: 4.6 mmol/L (ref 3.5–5.2)
Sodium: 134 mmol/L (ref 134–144)

## 2017-04-19 MED ORDER — LOSARTAN POTASSIUM 25 MG PO TABS: 25.0000 mg | ORAL_TABLET | Freq: Every day | ORAL | 3 refills | Status: DC

## 2017-04-19 MED ORDER — CLOPIDOGREL BISULFATE 75 MG PO TABS: 75.0000 mg | ORAL_TABLET | Freq: Every day | ORAL | 3 refills | Status: DC

## 2017-04-19 NOTE — Progress Notes (Signed)
Cardiology Office Note   Date:  04/20/2017   ID:  Hunter Choi, DOB 06-08-1927, MRN [id number]  PCP:  Hunter Ishihara, MD  Cardiologist:  Dr. Katrinka Choi     Chief Complaint  Patient presents with  . Hospitalization Follow-up    fatigue      History of Present Illness: Hunter Choi is a 84 y.o. male who presents for post hospital 02/17/17 for NSTEMI EF 35-40%, G1DD, TAVR was functioning normally.  His cath revealed graft that was too far diseased and unable to risk PCI.  Plavix was added to medication. Discharged to SNF.    He has a hx of transaortic valve replacement for aortic stenosis, CAD with bypass graft occlusion, hearing loss, newly developed systolic heart failure with most recent LVEF less than 30% and chronic kidney disease.  LBBB.  Today he is feeling well.  He is back at home and his sone lives with him.  The pt does most of his ADLs.  He has not chest pain or SOB.  He will continue Plavix.  His appetite is good and no swelling.   Past Medical History:  Diagnosis Date  . AAA (abdominal aortic aneurysm) (HCC)    Hunter Choi  . Aortic stenosis, severe    a. s/p TAVR  . Blind left eye   . BPH (benign prostatic hypertrophy)    Hunter Choi  . CAD (coronary artery disease) of bypass graft    Chronic occlusion of saphenous vein graft to RCA  . Coronary artery disease    Left main disease and severe 3-vessel CAD  . Diastolic heart failure (HCC)   . Embolism involving retinal artery   . GERD (gastroesophageal reflux disease)   . Hard of hearing   . Hypertension   . Hypothyroidism   . Iliac artery aneurysm (HCC)    Hunter Choi  . PAF (paroxysmal atrial fibrillation) (HCC)   . 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 12/2012: mm Hunter Choi XT transcatheter heart valve placed via transapical approach  . Stroke [facility])    mini stroke effective his eye left    Past Surgical History:  Procedure Laterality Date  . BIOPSY  PROSTATE  2005  . BUNIONECTOMY WITH HAMMERTOE RECONSTRUCTION Bilateral   . CARDIAC CATHETERIZATION    . CARDIAC CATHETERIZATION N/A 05/07/2015   Procedure: Right Heart Cath and Coronary/Graft Angiography;  Surgeon: Hunter Records, MD;  Location: [facility] INVASIVE CV LAB;  Service: Cardiovascular;  Laterality: N/A;  . CAROTID ENDARTERECTOMY Right Jan. 2, 1997  . CATARACT EXTRACTION W/ INTRAOCULAR LENS IMPLANT Right   . CHOLECYSTECTOMY N/A 01/28/2015   Procedure: LAPAROSCOPIC CHOLECYSTECTOMY WITH INTRAOPERATIVE CHOLANGIOGRAM;  Surgeon: Hunter Adu, MD;  Location: [facility] OR;  Service: General;  Laterality: N/A;  . COLONOSCOPY    . CORONARY ARTERY BYPASS GRAFT  01/1998   Hunter Choi; LIMA to LAD, SVG to D1, SVG to LCx, SVG to RCA, open saphenous vein harvest via right lower extremity  . HARVEST BONE GRAFT  1982   FOR THE  ANKLE  . HEMIARTHROPLASTY HIP Left    AFTER HIP FX  . INTRAOPERATIVE TRANSESOPHAGEAL ECHOCARDIOGRAM N/A 01/15/2013   Procedure: INTRAOPERATIVE TRANSESOPHAGEAL ECHOCARDIOGRAM;  Surgeon: Hunter Borne, MD;  Location: [facility] OR;  Service: Open Heart Surgery;  Laterality: N/A;  . JOINT REPLACEMENT    . LEFT AND RIGHT HEART CATHETERIZATION WITH CORONARY ANGIOGRAM N/A 12/20/2012   Procedure: LEFT AND RIGHT HEART CATHETERIZATION WITH CORONARY ANGIOGRAM;  Surgeon:  Hunter Noe, MD;  Location: [facility] CATH LAB;  Service: Cardiovascular;  Laterality: N/A;  . LEFT HEART CATH AND CORS/GRAFTS ANGIOGRAPHY N/A 02/14/2017   Procedure: LEFT HEART CATH AND CORS/GRAFTS ANGIOGRAPHY;  Surgeon: Hunter Lex, MD;  Location: [facility] INVASIVE CV LAB;  Service: Cardiovascular;  Laterality: N/A;  . SKIN GRAFTS  1968  . TOTAL HIP ARTHROPLASTY Left 2007  . TOTAL KNEE ARTHROPLASTY  1992-2002   right-left  . TRANSCATHETER AORTIC VALVE REPLACEMENT, TRANSAPICAL N/A 01/15/2013   Procedure: TRANSCATHETER AORTIC VALVE REPLACEMENT, TRANSAPICAL;  Surgeon: Hunter Borne, MD;  Location: [facility] OR;  Service: Open Heart Surgery;   Laterality: N/A;  . TRANSURETHRAL RESECTION OF PROSTATE  2012     Current Outpatient Medications  Medication Sig Dispense Refill  . acetaminophen (TYLENOL) 325 MG tablet Take 2 tablets (650 mg total) by mouth every 6 (six) hours as needed for mild pain, moderate pain, fever or headache.    Hunter Choi Kitchen amLODipine (NORVASC) 5 MG tablet Take 5 mg by mouth daily.    Hunter Choi Kitchen aspirin EC 81 MG EC tablet Take 1 tablet (81 mg total) by mouth daily.    Hunter Choi Kitchen atorvastatin (LIPITOR) 20 MG tablet Take 20 mg by mouth daily.    . carvedilol (COREG) 6.25 MG tablet Take 1 tablet (6.25 mg total) by mouth 2 (two) times daily with a meal. 60 tablet 3  . clopidogrel (PLAVIX) 75 MG tablet Take 1 tablet (75 mg total) by mouth daily. 90 tablet 3  . fluticasone (FLONASE) 50 MCG/ACT nasal spray Place 1 spray into both nostrils daily.     . folic acid (FOLVITE) 1 MG tablet Take 3 mg by mouth daily.    . furosemide (LASIX) 20 MG tablet Take 40 mg by mouth daily.    Hunter Choi Kitchen lactose free nutrition (BOOST) LIQD Take 237 mLs daily by mouth.    . levothyroxine (SYNTHROID, LEVOTHROID) 50 MCG tablet Take 50 mcg by mouth daily.  0  . loratadine (CLARITIN) 10 MG tablet Take 10 mg by mouth daily.  1  . MELATONIN PO Take 1 tablet by mouth daily.    . methotrexate 2.5 MG tablet Take 25 mg by mouth every Friday.     . nitroGLYCERIN (NITROSTAT) 0.4 MG SL tablet PLACE 1 TABLET (0.4 MG TOTAL) UNDER THE TONGUE EVERY 5 MINUTES AS NEEDED FOR CHEST PAIN 25 tablet 5  . pantoprazole (PROTONIX) 40 MG tablet Take 1 tablet (40 mg total) by mouth daily. 90 tablet 3  . predniSONE (DELTASONE) 5 MG tablet Take 5 mg by mouth daily.  3  . losartan (COZAAR) 25 MG tablet Take 1 tablet (25 mg total) by mouth daily. 90 tablet 3   No current facility-administered medications for this visit.     Allergies:   Ciprofloxacin    Social History:  The patient  reports that he quit smoking about 59 years ago. His smoking use included cigarettes. He has a 23.50 pack-year smoking  history. He has quit using smokeless tobacco. His smokeless tobacco use included chew. He reports that he does not drink alcohol or use drugs.   Family History:  The patient's Mother and father died with hear disease    ROS:  General:no colds or fevers, no weight changes Skin:no rashes or ulcers HEENT:no blurred vision, no congestion CV:see HPI PUL:see HPI GI:no diarrhea constipation or melena, no indigestion GU:no hematuria, no dysuria MS:no joint pain, no claudication Neuro:no syncope, no lightheadedness Endo:no diabetes, + thyroid disease  Wt Readings from Last  3 Encounters:  04/19/17 150 lb 12.8 oz (68.4 kg)  02/17/17 154 lb 8.7 oz (70.1 kg)  01/11/17 152 lb (68.9 kg)     PHYSICAL EXAM: VS:  BP (!) 108/50   Pulse 63   Resp 16   Ht 5\' 6"  (1.676 m)   Wt 150 lb 12.8 oz (68.4 kg)   BMI 24.34 kg/m  , BMI Body mass index is 24.34 kg/m. General:Pleasant affect, NAD Skin:Warm and dry, brisk capillary refill HEENT:normocephalic, sclera clear, mucus membranes moist Neck:supple, no JVD, no bruits  Heart:S1S2 RRR without murmur, gallup, rub or click Lungs:clear without rales, rhonchi, or wheezes , non tender, + BS, do not palpate liver spleen or masses Ext:no lower ext edema, 2+ pedal pulses, 2+ radial pulses Neuro:alert and oriented X 3, MAE, follows commands, + facial symmetry    EKG:  EKG is ordered today. The ekg ordered today demonstrates SR at 63 with 1st degree AV block of 280 ms.  LBBB and LAD no changes   Recent Labs: 02/12/2017: ALT 13 02/13/2017: B Natriuretic Peptide 1,875.3 02/17/2017: Hemoglobin 10.0; Platelets 149 04/19/2017: BUN 29; Creatinine, Ser 1.06; Potassium 4.6; Sodium 134    Lipid Panel    Component Value Date/Time   CHOL 84 02/21/2014 0210   TRIG 93 02/21/2014 0210   HDL 27 (L) 02/21/2014 0210   CHOLHDL 3.1 02/21/2014 0210   VLDL 19 02/21/2014 0210   LDLCALC 38 02/21/2014 0210       Other studies Reviewed: Additional studies/  Choi that were reviewed today include: .  Cardiac cath 11/201/8 Procedures   LEFT HEART CATH AND CORS/GRAFTS ANGIOGRAPHY  Conclusion     Mid LM to Ost LAD lesion is 65% stenosed.  Ost Cx to Prox Cx lesion is 90% stenosed. Mid Cx lesion is 100% stenosed.  Ost LAD to Prox LAD lesion is 99% stenosed. Prox LAD to Mid LAD lesion is 100% stenosed.  LIMA-LAD graft is widely patent  Ost 1st Diag lesion is 99% stenosed.  SVG-Diag1/Ramus -very large, patulous/ectatic vessel. The grafted vessel is smooth without disease.  SVG-OM - prox Graft lesion is 70% focal, thrombotic lesion in a valve segment.  Prox RCA lesion is 75% stenosed. Dist RCA lesion is 100% stenosed with 100% stenosed side branch in Post Atrio.  SVG-distal RCA origin lesion is 100% stenosed.  There is severe left ventricular systolic dysfunction.  LV end diastolic pressure is moderately elevated.  The left ventricular ejection fraction is 25-35% by visual estimate.   The patient has severe native vessel disease with essentially no native Left Coronary Artery vessels remaining patent.  The native RCA has extensive disease in the proximal segment and then appears to be occluded at the bifurcation of the posterolateral branch and PDA.  The graft to the RPDA is known to be occluded. There are 2 remaining vein grafts are patent, however the vein graft to OM1 focal thrombotic lesion and what appears to be a graft.  The remainder of this graft as well as the SVG-Diag/RI are very patulous/ectatic vessels almost aneurysmal.  Intervention on a 84 year old vein graft with this much disease is likely fraught with high risk for complications.  Given his extreme cardiomyopathy, I fear that attempted PCI of this vessel with potential occlusion of the graft and the downstream vessel would lead to life-threatening hemodynamic instability.  I reviewed the images with Dr. 26, he and I both believe that the graft is too far disease to  risk attempted PCI.  Therefore the  plan will be to return the patient to the nursing unit, run heparin for this 48 hours and continue current medical management with addition of Plavix.  Plan: The patient will move to the holding area for sheath to be removed with manual pressure. Restart IV heparin 8 hours after sheath removal. I have little with 300 mg Plavix today and then starts in her milligrams Plavix tomorrow. Based on patient's symptoms, would need to titrate antianginal as well as heart failure medications.  Unfortunately, I was not able to get much of a story from the patient today and would prefer for this to be managed by the primary team.     Echo 11/201/8  Study Conclusions  - Left ventricle: The cavity size was normal. Systolic function was   moderately reduced. The estimated ejection fraction was in the   range of 35% to 40%. Mild hypokinesis of the apical myocardium;   consistent with ischemia in the distribution of the left anterior   descending coronary artery. Severe hypokinesis and scarring of   the basal-midinferolateral and inferior myocardium; consistent   with infarction in the distribution of the right coronary or left   circumflex coronary artery. Doppler parameters are consistent   with abnormal left ventricular relaxation (grade 1 diastolic   dysfunction). Doppler parameters are consistent with elevated   mean left atrial filling pressure. - Aortic valve: A stent-valve (TAVR) bioprosthesis was present and   functioning normally. There was trivial regurgitation. Valve area   (VTI): 2.81 cm^2. Valve area (Vmax): 2.97 cm^2. Valve area   (Vmean): 2.85 cm^2. - Mitral valve: There was mild regurgitation. - Left atrium: The atrium was mildly dilated. - Right atrium: The atrium was mildly dilated.  Impressions:  - Left ventricular systolic function has improved compared to   October 2017 report.  ASSESSMENT AND PLAN:  1.  NSTEMI but no PCI we did  add Plavix, he has had no chest pain or SOB-- he was discharged 02/17/17 but went to SNF so this is his first visit back.  Follow up with Dr. Katrinka Choi in 5 months and PRN  2.  CAD on bypass graft now stable.  3.  Hx TAVR  4.  Chronic systolic and diastolic HF.  euvolemic today  5.  Iliac artery aneurysm  6.  HTN controlled   Current medicines are reviewed with the patient today.  The patient Has no concerns regarding medicines.  The following changes have been made:  See above Labs/ tests ordered today include:see above  Disposition:   FU:  see above  Signed, Nada Boozer, NP  04/20/2017 5:35 PM    [facility] [address] [place], [place], [place]  [address]/ [address] [place], [place] Phone: [phone]; Fax: [phone]  [phone]

## 2017-04-19 NOTE — Patient Instructions (Signed)
Medication Instructions:  Your physician recommends that you continue on your current medications as directed. Please refer to the Current Medication list given to you today.   Labwork: TODAY: BMP   Testing/Procedures: NONE ORDERED  Follow-Up: KEEP YOUR APPOINTMENT ON September 18, 2017 AT 1:40 PM WITH DR. Katrinka Blazing  Any Other Special Instructions Will Be Listed Below (If Applicable).     If you need a refill on your cardiac medications before your next appointment, please call your pharmacy.

## 2017-04-20 ENCOUNTER — Encounter: Payer: Self-pay | Admitting: Cardiology

## 2017-06-29 ENCOUNTER — Ambulatory Visit: Payer: Medicare Other | Admitting: Interventional Cardiology

## 2017-08-03 ENCOUNTER — Emergency Department (HOSPITAL_COMMUNITY): Payer: Medicare Other

## 2017-08-03 ENCOUNTER — Inpatient Hospital Stay (HOSPITAL_COMMUNITY)
Admission: EM | Admit: 2017-08-03 | Discharge: 2017-08-07 | DRG: 864 | Disposition: A | Discharge status: Home Health | Payer: Medicare Other | Attending: Family Medicine | Admitting: Family Medicine

## 2017-08-03 ENCOUNTER — Other Ambulatory Visit: Payer: Self-pay

## 2017-08-03 ENCOUNTER — Encounter (HOSPITAL_COMMUNITY): Payer: Self-pay | Admitting: Emergency Medicine

## 2017-08-03 DIAGNOSIS — N179 Acute kidney failure, unspecified: Secondary | ICD-10-CM | POA: Diagnosis not present

## 2017-08-03 DIAGNOSIS — Z953 Presence of xenogenic heart valve: Secondary | ICD-10-CM

## 2017-08-03 DIAGNOSIS — G9340 Encephalopathy, unspecified: Secondary | ICD-10-CM | POA: Diagnosis not present

## 2017-08-03 DIAGNOSIS — I252 Old myocardial infarction: Secondary | ICD-10-CM | POA: Diagnosis not present

## 2017-08-03 DIAGNOSIS — N183 Chronic kidney disease, stage 3 (moderate): Secondary | ICD-10-CM | POA: Diagnosis present

## 2017-08-03 DIAGNOSIS — I2583 Coronary atherosclerosis due to lipid rich plaque: Secondary | ICD-10-CM | POA: Diagnosis not present

## 2017-08-03 DIAGNOSIS — Z8249 Family history of ischemic heart disease and other diseases of the circulatory system: Secondary | ICD-10-CM

## 2017-08-03 DIAGNOSIS — K219 Gastro-esophageal reflux disease without esophagitis: Secondary | ICD-10-CM | POA: Diagnosis present

## 2017-08-03 DIAGNOSIS — I5042 Chronic combined systolic (congestive) and diastolic (congestive) heart failure: Secondary | ICD-10-CM | POA: Diagnosis not present

## 2017-08-03 DIAGNOSIS — R6511 Systemic inflammatory response syndrome (SIRS) of non-infectious origin with acute organ dysfunction: Secondary | ICD-10-CM | POA: Diagnosis present

## 2017-08-03 DIAGNOSIS — Z79890 Hormone replacement therapy (postmenopausal): Secondary | ICD-10-CM | POA: Diagnosis not present

## 2017-08-03 DIAGNOSIS — E785 Hyperlipidemia, unspecified: Secondary | ICD-10-CM | POA: Diagnosis not present

## 2017-08-03 DIAGNOSIS — R6510 Systemic inflammatory response syndrome (SIRS) of non-infectious origin without acute organ dysfunction: Secondary | ICD-10-CM | POA: Diagnosis not present

## 2017-08-03 DIAGNOSIS — I959 Hypotension, unspecified: Secondary | ICD-10-CM | POA: Diagnosis present

## 2017-08-03 DIAGNOSIS — I723 Aneurysm of iliac artery: Secondary | ICD-10-CM

## 2017-08-03 DIAGNOSIS — R509 Fever, unspecified: Principal | ICD-10-CM | POA: Diagnosis present

## 2017-08-03 DIAGNOSIS — D649 Anemia, unspecified: Secondary | ICD-10-CM

## 2017-08-03 DIAGNOSIS — Z8673 Personal history of transient ischemic attack (TIA), and cerebral infarction without residual deficits: Secondary | ICD-10-CM

## 2017-08-03 DIAGNOSIS — R748 Abnormal levels of other serum enzymes: Secondary | ICD-10-CM | POA: Diagnosis not present

## 2017-08-03 DIAGNOSIS — R4182 Altered mental status, unspecified: Secondary | ICD-10-CM

## 2017-08-03 DIAGNOSIS — I480 Paroxysmal atrial fibrillation: Secondary | ICD-10-CM | POA: Diagnosis present

## 2017-08-03 DIAGNOSIS — I340 Nonrheumatic mitral (valve) insufficiency: Secondary | ICD-10-CM | POA: Diagnosis not present

## 2017-08-03 DIAGNOSIS — I130 Hypertensive heart and chronic kidney disease with heart failure and stage 1 through stage 4 chronic kidney disease, or unspecified chronic kidney disease: Secondary | ICD-10-CM | POA: Diagnosis not present

## 2017-08-03 DIAGNOSIS — R0989 Other specified symptoms and signs involving the circulatory and respiratory systems: Secondary | ICD-10-CM

## 2017-08-03 DIAGNOSIS — D696 Thrombocytopenia, unspecified: Secondary | ICD-10-CM

## 2017-08-03 DIAGNOSIS — Z7982 Long term (current) use of aspirin: Secondary | ICD-10-CM

## 2017-08-03 DIAGNOSIS — M069 Rheumatoid arthritis, unspecified: Secondary | ICD-10-CM | POA: Diagnosis present

## 2017-08-03 DIAGNOSIS — I5043 Acute on chronic combined systolic (congestive) and diastolic (congestive) heart failure: Secondary | ICD-10-CM | POA: Diagnosis not present

## 2017-08-03 DIAGNOSIS — Z7951 Long term (current) use of inhaled steroids: Secondary | ICD-10-CM | POA: Diagnosis not present

## 2017-08-03 DIAGNOSIS — I10 Essential (primary) hypertension: Secondary | ICD-10-CM

## 2017-08-03 DIAGNOSIS — Z87891 Personal history of nicotine dependence: Secondary | ICD-10-CM

## 2017-08-03 DIAGNOSIS — R7989 Other specified abnormal findings of blood chemistry: Secondary | ICD-10-CM | POA: Insufficient documentation

## 2017-08-03 DIAGNOSIS — Z7952 Long term (current) use of systemic steroids: Secondary | ICD-10-CM | POA: Diagnosis not present

## 2017-08-03 DIAGNOSIS — I2510 Atherosclerotic heart disease of native coronary artery without angina pectoris: Secondary | ICD-10-CM | POA: Diagnosis present

## 2017-08-03 DIAGNOSIS — Z952 Presence of prosthetic heart valve: Secondary | ICD-10-CM

## 2017-08-03 DIAGNOSIS — Z7902 Long term (current) use of antithrombotics/antiplatelets: Secondary | ICD-10-CM | POA: Diagnosis not present

## 2017-08-03 DIAGNOSIS — E039 Hypothyroidism, unspecified: Secondary | ICD-10-CM | POA: Diagnosis not present

## 2017-08-03 DIAGNOSIS — Z79899 Other long term (current) drug therapy: Secondary | ICD-10-CM

## 2017-08-03 DIAGNOSIS — Z96653 Presence of artificial knee joint, bilateral: Secondary | ICD-10-CM | POA: Diagnosis present

## 2017-08-03 DIAGNOSIS — Z96642 Presence of left artificial hip joint: Secondary | ICD-10-CM | POA: Diagnosis present

## 2017-08-03 DIAGNOSIS — R778 Other specified abnormalities of plasma proteins: Secondary | ICD-10-CM | POA: Insufficient documentation

## 2017-08-03 DIAGNOSIS — E876 Hypokalemia: Secondary | ICD-10-CM | POA: Diagnosis present

## 2017-08-03 LAB — CBC WITH DIFFERENTIAL/PLATELET
BASOS ABS: 0 10*3/uL (ref 0.0–0.1)
BASOS ABS: 0 10*3/uL (ref 0.0–0.1)
BASOS PCT: 0 %
Basophils Relative: 0 %
EOS ABS: 0 10*3/uL (ref 0.0–0.7)
EOS ABS: 0 10*3/uL (ref 0.0–0.7)
EOS PCT: 0 %
Eosinophils Relative: 0 %
HCT: 32.8 % — ABNORMAL LOW (ref 39.0–52.0)
HEMATOCRIT: 36.1 % — AB (ref 39.0–52.0)
Hemoglobin: 11.1 g/dL — ABNORMAL LOW (ref 13.0–17.0)
Hemoglobin: 10.1 g/dL — ABNORMAL LOW (ref 13.0–17.0)
LYMPHS ABS: 2.5 10*3/uL (ref 0.7–4.0)
Lymphocytes Relative: 27 %
Lymphocytes Relative: 33 %
Lymphs Abs: 3.3 10*3/uL (ref 0.7–4.0)
MCH: 26.4 pg (ref 26.0–34.0)
MCH: 26.6 pg (ref 26.0–34.0)
MCHC: 30.7 g/dL (ref 30.0–36.0)
MCHC: 30.8 g/dL (ref 30.0–36.0)
MCV: 85.7 fL (ref 78.0–100.0)
MCV: 86.3 fL (ref 78.0–100.0)
MONO ABS: 0.8 10*3/uL (ref 0.1–1.0)
Monocytes Absolute: 1 10*3/uL (ref 0.1–1.0)
Monocytes Relative: 9 %
Monocytes Relative: 10 %
NEUTROS PCT: 64 %
Neutro Abs: 5.8 10*3/uL (ref 1.7–7.7)
Neutro Abs: 5.5 10*3/uL (ref 1.7–7.7)
Neutrophils Relative %: 57 %
PLATELETS: 99 10*3/uL — AB (ref 150–400)
Platelets: 124 10*3/uL — ABNORMAL LOW (ref 150–400)
RBC: 4.21 MIL/uL — AB (ref 4.22–5.81)
RBC: 3.8 MIL/uL — AB (ref 4.22–5.81)
RDW: 17.6 % — ABNORMAL HIGH (ref 11.5–15.5)
RDW: 18 % — ABNORMAL HIGH (ref 11.5–15.5)
WBC: 9.1 10*3/uL (ref 4.0–10.5)
WBC: 9.8 10*3/uL (ref 4.0–10.5)

## 2017-08-03 LAB — LACTIC ACID, PLASMA
LACTIC ACID, VENOUS: 1.8 mmol/L (ref 0.5–1.9)
Lactic Acid, Venous: 1 mmol/L (ref 0.5–1.9)

## 2017-08-03 LAB — MRSA PCR SCREENING: MRSA by PCR: NEGATIVE

## 2017-08-03 LAB — COMPREHENSIVE METABOLIC PANEL
ALK PHOS: 170 U/L — AB (ref 38–126)
ALT: 43 U/L (ref 17–63)
ALT: 35 U/L (ref 17–63)
ANION GAP: 10 (ref 5–15)
AST: 35 U/L (ref 15–41)
AST: 32 U/L (ref 15–41)
Albumin: 3.3 g/dL — ABNORMAL LOW (ref 3.5–5.0)
Albumin: 2.9 g/dL — ABNORMAL LOW (ref 3.5–5.0)
Alkaline Phosphatase: 136 U/L — ABNORMAL HIGH (ref 38–126)
Anion gap: 9 (ref 5–15)
BUN: 33 mg/dL — ABNORMAL HIGH (ref 6–20)
BUN: 34 mg/dL — ABNORMAL HIGH (ref 6–20)
CALCIUM: 8.6 mg/dL — AB (ref 8.9–10.3)
CHLORIDE: 100 mmol/L — AB (ref 101–111)
CO2: 27 mmol/L (ref 22–32)
CO2: 25 mmol/L (ref 22–32)
CREATININE: 1.38 mg/dL — AB (ref 0.61–1.24)
CREATININE: 1.46 mg/dL — AB (ref 0.61–1.24)
Calcium: 8 mg/dL — ABNORMAL LOW (ref 8.9–10.3)
Chloride: 97 mmol/L — ABNORMAL LOW (ref 101–111)
GFR calc non Af Amer: 41 mL/min — ABNORMAL LOW (ref >60)
GFR, EST AFRICAN AMERICAN: 51 mL/min — AB (ref >60)
GFR, EST AFRICAN AMERICAN: 48 mL/min — AB (ref >60)
GFR, EST NON AFRICAN AMERICAN: 44 mL/min — AB (ref >60)
Glucose, Bld: 137 mg/dL — ABNORMAL HIGH (ref 65–99)
Glucose, Bld: 100 mg/dL — ABNORMAL HIGH (ref 65–99)
Potassium: 4.4 mmol/L (ref 3.5–5.1)
Potassium: 4.2 mmol/L (ref 3.5–5.1)
SODIUM: 134 mmol/L — AB (ref 135–145)
Sodium: 134 mmol/L — ABNORMAL LOW (ref 135–145)
Total Bilirubin: 0.8 mg/dL (ref 0.3–1.2)
Total Bilirubin: 0.9 mg/dL (ref 0.3–1.2)
Total Protein: 6.5 g/dL (ref 6.5–8.1)
Total Protein: 5.9 g/dL — ABNORMAL LOW (ref 6.5–8.1)

## 2017-08-03 LAB — INFLUENZA PANEL BY PCR (TYPE A & B)
INFLAPCR: NEGATIVE
Influenza B By PCR: NEGATIVE

## 2017-08-03 LAB — GLUCOSE, CAPILLARY: GLUCOSE-CAPILLARY: 149 mg/dL — AB (ref 65–99)

## 2017-08-03 LAB — URINALYSIS, ROUTINE W REFLEX MICROSCOPIC
BACTERIA UA: NONE SEEN
Bilirubin Urine: NEGATIVE
Glucose, UA: NEGATIVE mg/dL
KETONES UR: NEGATIVE mg/dL
Leukocytes, UA: NEGATIVE
NITRITE: NEGATIVE
Protein, ur: 100 mg/dL — AB
Specific Gravity, Urine: 1.016 (ref 1.005–1.030)
pH: 6 (ref 5.0–8.0)

## 2017-08-03 LAB — I-STAT CG4 LACTIC ACID, ED: LACTIC ACID, VENOUS: 1.03 mmol/L (ref 0.5–1.9)

## 2017-08-03 LAB — CBG MONITORING, ED
GLUCOSE-CAPILLARY: 108 mg/dL — AB (ref 65–99)
GLUCOSE-CAPILLARY: 142 mg/dL — AB (ref 65–99)

## 2017-08-03 LAB — TROPONIN I
TROPONIN I: 0.92 ng/mL — AB (ref <0.03)
TROPONIN I: 0.39 ng/mL — AB (ref <0.03)
Troponin I: 1.23 ng/mL (ref <0.03)

## 2017-08-03 LAB — APTT: aPTT: 37 seconds — ABNORMAL HIGH (ref 24–36)

## 2017-08-03 LAB — BRAIN NATRIURETIC PEPTIDE: B Natriuretic Peptide: 2896.1 pg/mL — ABNORMAL HIGH (ref 0.0–100.0)

## 2017-08-03 LAB — PROCALCITONIN: Procalcitonin: 1.61 ng/mL

## 2017-08-03 LAB — PROTIME-INR
INR: 1.34
Prothrombin Time: 16.5 seconds — ABNORMAL HIGH (ref 11.4–15.2)

## 2017-08-03 MED ORDER — SODIUM CHLORIDE 0.9 % IV SOLN
INTRAVENOUS | Status: AC
  Administered 2017-08-03: 08:00:00 via INTRAVENOUS

## 2017-08-03 MED ORDER — SODIUM CHLORIDE 0.9 % IV BOLUS
500.0000 mL | Freq: Once | INTRAVENOUS | Status: AC
  Administered 2017-08-03: 500 mL via INTRAVENOUS

## 2017-08-03 MED ORDER — ENOXAPARIN SODIUM 40 MG/0.4ML ~~LOC~~ SOLN
40.0000 mg | SUBCUTANEOUS | Status: DC
  Administered 2017-08-03: 40 mg via SUBCUTANEOUS
  Filled 2017-08-03: qty 0.4

## 2017-08-03 MED ORDER — NITROGLYCERIN 0.4 MG SL SUBL: 0.4000 mg | SUBLINGUAL_TABLET | SUBLINGUAL | Status: DC | PRN

## 2017-08-03 MED ORDER — LEVOTHYROXINE SODIUM 50 MCG PO TABS
50.0000 ug | ORAL_TABLET | Freq: Every day | ORAL | Status: DC
  Administered 2017-08-04 – 2017-08-07 (×4): 50 ug via ORAL
  Filled 2017-08-03 (×5): qty 1

## 2017-08-03 MED ORDER — PIPERACILLIN-TAZOBACTAM 3.375 G IVPB
3.3750 g | Freq: Three times a day (TID) | INTRAVENOUS | Status: DC
  Administered 2017-08-03 – 2017-08-06 (×9): 3.375 g via INTRAVENOUS
  Filled 2017-08-03 (×10): qty 50

## 2017-08-03 MED ORDER — METOCLOPRAMIDE HCL 5 MG/ML IJ SOLN
10.0000 mg | Freq: Once | INTRAMUSCULAR | Status: AC
  Administered 2017-08-03: 10 mg via INTRAVENOUS
  Filled 2017-08-03: qty 2

## 2017-08-03 MED ORDER — ACETAMINOPHEN 325 MG PO TABS: 650.0000 mg | ORAL_TABLET | Freq: Four times a day (QID) | ORAL | Status: DC | PRN

## 2017-08-03 MED ORDER — FLUTICASONE PROPIONATE 50 MCG/ACT NA SUSP
1.0000 | Freq: Every day | NASAL | Status: DC
Start: 2017-08-03 — End: 2017-08-07
  Administered 2017-08-04 – 2017-08-07 (×4): 1 via NASAL
  Filled 2017-08-03: qty 16

## 2017-08-03 MED ORDER — PIPERACILLIN-TAZOBACTAM 3.375 G IVPB 30 MIN
3.3750 g | Freq: Once | INTRAVENOUS | Status: AC
  Administered 2017-08-03: 3.375 g via INTRAVENOUS
  Filled 2017-08-03: qty 50

## 2017-08-03 MED ORDER — CLOPIDOGREL BISULFATE 75 MG PO TABS
75.0000 mg | ORAL_TABLET | Freq: Every day | ORAL | Status: DC
  Administered 2017-08-04 – 2017-08-07 (×4): 75 mg via ORAL
  Filled 2017-08-03 (×5): qty 1

## 2017-08-03 MED ORDER — HEPARIN BOLUS VIA INFUSION
2000.0000 [IU] | Freq: Once | INTRAVENOUS | Status: AC
  Administered 2017-08-03: 2000 [IU] via INTRAVENOUS
  Filled 2017-08-03: qty 2000

## 2017-08-03 MED ORDER — BOOST PO LIQD
237.0000 mL | ORAL | Status: DC
  Administered 2017-08-05 – 2017-08-07 (×3): 237 mL via ORAL
  Filled 2017-08-03 (×6): qty 237

## 2017-08-03 MED ORDER — ACETAMINOPHEN 650 MG RE SUPP: 650.0000 mg | Freq: Four times a day (QID) | RECTAL | Status: DC | PRN

## 2017-08-03 MED ORDER — ONDANSETRON HCL 4 MG PO TABS: 4.0000 mg | ORAL_TABLET | Freq: Four times a day (QID) | ORAL | Status: DC | PRN

## 2017-08-03 MED ORDER — ASPIRIN EC 81 MG PO TBEC
81.0000 mg | DELAYED_RELEASE_TABLET | Freq: Every day | ORAL | Status: DC
  Administered 2017-08-04 – 2017-08-07 (×4): 81 mg via ORAL
  Filled 2017-08-03 (×4): qty 1

## 2017-08-03 MED ORDER — VANCOMYCIN HCL IN DEXTROSE 1-5 GM/200ML-% IV SOLN
1000.0000 mg | Freq: Once | INTRAVENOUS | Status: AC
  Administered 2017-08-03: 1000 mg via INTRAVENOUS
  Filled 2017-08-03: qty 200

## 2017-08-03 MED ORDER — HEPARIN (PORCINE) IN NACL 100-0.45 UNIT/ML-% IJ SOLN
850.0000 [IU]/h | INTRAMUSCULAR | Status: DC
  Administered 2017-08-03 – 2017-08-04 (×3): 800 [IU]/h via INTRAVENOUS
  Filled 2017-08-03 (×2): qty 250

## 2017-08-03 MED ORDER — ACETAMINOPHEN 650 MG RE SUPP
650.0000 mg | Freq: Once | RECTAL | Status: AC
  Administered 2017-08-03: 650 mg via RECTAL
  Filled 2017-08-03: qty 1

## 2017-08-03 MED ORDER — VANCOMYCIN HCL IN DEXTROSE 1-5 GM/200ML-% IV SOLN
1000.0000 mg | INTRAVENOUS | Status: DC
  Administered 2017-08-04: 1000 mg via INTRAVENOUS
  Filled 2017-08-03 (×2): qty 200

## 2017-08-03 MED ORDER — FOLIC ACID 1 MG PO TABS
3.0000 mg | ORAL_TABLET | Freq: Every day | ORAL | Status: DC
  Administered 2017-08-04 – 2017-08-07 (×4): 3 mg via ORAL
  Filled 2017-08-03 (×5): qty 3

## 2017-08-03 MED ORDER — ATORVASTATIN CALCIUM 20 MG PO TABS
20.0000 mg | ORAL_TABLET | Freq: Every day | ORAL | Status: DC
  Administered 2017-08-03 – 2017-08-07 (×5): 20 mg via ORAL
  Filled 2017-08-03 (×6): qty 1

## 2017-08-03 MED ORDER — HYDROCORTISONE NA SUCCINATE PF 100 MG IJ SOLR
50.0000 mg | Freq: Four times a day (QID) | INTRAMUSCULAR | Status: DC
  Administered 2017-08-03 – 2017-08-04 (×4): 50 mg via INTRAVENOUS
  Filled 2017-08-03 (×3): qty 2

## 2017-08-03 MED ORDER — PANTOPRAZOLE SODIUM 40 MG PO TBEC
40.0000 mg | DELAYED_RELEASE_TABLET | Freq: Every day | ORAL | Status: DC
  Administered 2017-08-04 – 2017-08-07 (×4): 40 mg via ORAL
  Filled 2017-08-03 (×5): qty 1

## 2017-08-03 MED ORDER — ONDANSETRON HCL 4 MG/2ML IJ SOLN: 4.0000 mg | Freq: Four times a day (QID) | INTRAMUSCULAR | Status: DC | PRN

## 2017-08-03 MED ORDER — ASPIRIN 300 MG RE SUPP
300.0000 mg | Freq: Once | RECTAL | Status: AC
  Administered 2017-08-03: 300 mg via RECTAL
  Filled 2017-08-03: qty 1

## 2017-08-03 NOTE — Progress Notes (Signed)
ANTICOAGULATION CONSULT NOTE - Initial Consult  Pharmacy Consult for heparin Indication: chest pain/ACS  Allergies  Allergen Reactions  . Ciprofloxacin Other (See Comments)    REACTION: mild delirium    Patient Measurements: Height: 5\' 7"  (170.2 cm) Weight: 150 lb (68 kg) IBW/kg (Calculated) : 66.1  Vital Signs: BP: 86/44 (05/09 1030) Pulse Rate: 56 (05/09 1030)  Labs: Recent Labs    08/03/17 0242 08/03/17 0245 08/03/17 0623 08/03/17 1317  HGB 11.1*  --  10.1*  --   HCT 36.1*  --  32.8*  --   PLT 124*  --  99*  --   APTT  --   --  37*  --   LABPROT  --   --  16.5*  --   INR  --   --  1.34  --   CREATININE  --  1.38* 1.46*  --   TROPONINI  --   --  1.23* 0.92*    Estimated Creatinine Clearance: 33.3 mL/min (A) (by C-G formula based on SCr of 1.46 mg/dL (H)).  Assessment: CC/HPI: Here with fever, confusion and productive cough. Septic   PMH: TAVR, CABG, AAA, CKD, CHF, anemia, thrombocytopenia   Anticoag: none pta iv hep for r/o acs   Renal: SCr 1.46   Heme/Onc: H&H 10.1/32.8, Plt 99  Goal of Therapy:  Heparin level 0.3-0.7 units/ml Monitor platelets by anticoagulation protocol: Yes   Plan:  Dc enoxaparin Heparin bolus 2000 units x 1 (1/2 since ppx enox given ~ 1000) Heparin gtt 800 units/hr Initial lvl 0000  Daily hl cbc F/u cards plans  09-12-1976, PharmD, BCPS, BCCCP Clinical Pharmacist Clinical phone for 08/03/2017 from 1430 - 2300: 10/03/2017 If after 2300, please call main pharmacy at: [phone] 08/03/2017 3:46 PM

## 2017-08-03 NOTE — ED Triage Notes (Addendum)
Pt to ED via [facility] for altered mental status since Tuesday.  Family reported fever since 9pm last night. Pt alert and following commands.  Oriented to person and place only.  Family reported productive cough but unsure of color.  Vomited x 2.  EMS administered Zofran 4mg  IV pta.

## 2017-08-03 NOTE — ED Notes (Signed)
Admitting paged x1 regarding elevated troponin.

## 2017-08-03 NOTE — ED Notes (Signed)
Portable chest x ray completed.

## 2017-08-03 NOTE — ED Notes (Signed)
Patient returned from CT

## 2017-08-03 NOTE — ED Notes (Signed)
Per Dr [name] Fleeting, no fluid at this time, lactic negative.

## 2017-08-03 NOTE — H&P (Addendum)
History and Physical    Hunter Choi TKP:[id number] DOB: 15-Apr-1927 DOA: 08/03/2017  PCP: [name], MD  Patient coming from: Home.  History obtained from patient and patient's son.  Chief Complaint: Fever and confusion.  HPI: Hunter Choi is a 84 y.o. male with history of CAD status post CABG, status post TAVR, abdominal aortic aneurysm, chronic kidney disease, chronic systolic heart failure, rheumatoid arthritis, hypothyroidism, chronic anemia and thrombocytopenia was brought to the ER after patient was found to have fever of 103 F at home with confusion.  As per the patient's son patient has been having cough with productive sputum last 2 days with upper respiratory tract symptoms.  Patient also complained of some epigastric discomfort.  Denies any chest pain.  Over the last 2 days patient has been having increasing fever and since patient became more confused patient was brought to the ER.  ED Course: In the ER patient was initially confused fever 104 F.  As per the patient's son patient gets very confused with fever.  Had a similar picture last year.  UA and chest x-ray were unremarkable.  Blood cultures were obtained and patient was started empirically on vancomycin and Zosyn.  On my exam patient has become more alert awake denies any headache denies any neck pain and on my exam abdomen is nontender patient is not wheezing or coughing and patient is being admitted for SIRS source not clear.  Review of Systems: As per HPI, rest all negative.   Past Medical History:  Diagnosis Date  . AAA (abdominal aortic aneurysm) (HCC)    Dr. Ashley Royalty  . Aortic stenosis, severe    a. s/p TAVR  . Blind left eye   . BPH (benign prostatic hypertrophy)    Dr. Retta Diones  . CAD (coronary artery disease) of bypass graft    Chronic occlusion of saphenous vein graft to RCA  . Coronary artery disease    Left main disease and severe 3-vessel CAD  . Diastolic heart failure (HCC)   .  Embolism involving retinal artery   . GERD (gastroesophageal reflux disease)   . Hard of hearing   . Hypertension   . Hypothyroidism   . Iliac artery aneurysm (HCC)    Dr. Edilia Bo  . PAF (paroxysmal atrial fibrillation) (HCC)   . 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 12/2012: mm Stephannie Peters XT transcatheter heart valve placed via transapical approach  . Stroke [facility])    mini stroke effective his eye left    Past Surgical History:  Procedure Laterality Date  . BIOPSY PROSTATE  2005  . BUNIONECTOMY WITH HAMMERTOE RECONSTRUCTION Bilateral   . CARDIAC CATHETERIZATION    . CARDIAC CATHETERIZATION N/A 05/07/2015   Procedure: Right Heart Cath and Coronary/Graft Angiography;  Surgeon: Lyn Records, MD;  Location: [facility] INVASIVE CV LAB;  Service: Cardiovascular;  Laterality: N/A;  . CAROTID ENDARTERECTOMY Right Jan. 2, 1997  . CATARACT EXTRACTION W/ INTRAOCULAR LENS IMPLANT Right   . CHOLECYSTECTOMY N/A 01/28/2015   Procedure: LAPAROSCOPIC CHOLECYSTECTOMY WITH INTRAOPERATIVE CHOLANGIOGRAM;  Surgeon: Gaynelle Adu, MD;  Location: [facility] OR;  Service: General;  Laterality: N/A;  . COLONOSCOPY    . CORONARY ARTERY BYPASS GRAFT  01/1998   Dr. Sheliah Plane; LIMA to LAD, SVG to D1, SVG to LCx, SVG to RCA, open saphenous vein harvest via right lower extremity  . HARVEST BONE GRAFT  1982   FOR THE  ANKLE  . HEMIARTHROPLASTY HIP Left  AFTER HIP FX  . INTRAOPERATIVE TRANSESOPHAGEAL ECHOCARDIOGRAM N/A 01/15/2013   Procedure: INTRAOPERATIVE TRANSESOPHAGEAL ECHOCARDIOGRAM;  Surgeon: [name], MD;  Location: [facility];  Service: Open Heart Surgery;  Laterality: N/A;  . JOINT REPLACEMENT    . LEFT AND RIGHT HEART CATHETERIZATION WITH CORONARY ANGIOGRAM N/A 12/20/2012   Procedure: LEFT AND RIGHT HEART CATHETERIZATION WITH CORONARY ANGIOGRAM;  Surgeon: Lesleigh Noe, MD;  Location: [facility] CATH LAB;  Service: Cardiovascular;  Laterality: N/A;  . LEFT HEART CATH AND CORS/GRAFTS  ANGIOGRAPHY N/A 02/14/2017   Procedure: LEFT HEART CATH AND CORS/GRAFTS ANGIOGRAPHY;  Surgeon: Marykay Lex, MD;  Location: [facility] INVASIVE CV LAB;  Service: Cardiovascular;  Laterality: N/A;  . SKIN GRAFTS  1968  . TOTAL HIP ARTHROPLASTY Left 2007  . TOTAL KNEE ARTHROPLASTY  1992-2002   right-left  . TRANSCATHETER AORTIC VALVE REPLACEMENT, TRANSAPICAL N/A 01/15/2013   Procedure: TRANSCATHETER AORTIC VALVE REPLACEMENT, TRANSAPICAL;  Surgeon: [name], MD;  Location: [facility] OR;  Service: Open Heart Surgery;  Laterality: N/A;  . TRANSURETHRAL RESECTION OF PROSTATE  2012     reports that he quit smoking about 59 years ago. His smoking use included cigarettes. He has a 23.50 pack-year smoking history. He has quit using smokeless tobacco. His smokeless tobacco use included chew. He reports that he does not drink alcohol or use drugs.  Allergies  Allergen Reactions  . Ciprofloxacin Other (See Comments)    REACTION: mild delirium    Family History  Problem Relation Age of Onset  . Heart attack Mother   . Heart disease Mother        Before age 29  . Hyperlipidemia Mother   . Hypertension Mother   . Hypertension Son   . Heart disease Father        AAA-   . Hyperlipidemia Father   . Heart attack Father   . Hypertension Father   . Heart disease Brother        Before age 72  . Hyperlipidemia Brother   . Heart attack Brother   . Hypertension Brother     Prior to Admission medications   Medication Sig Start Date End Date Taking? Authorizing Provider  acetaminophen (TYLENOL) 325 MG tablet Take 2 tablets (650 mg total) by mouth every 6 (six) hours as needed for mild pain, moderate pain, fever or headache. 02/03/15  Yes Hongalgi, Maximino Greenland, MD  amLODipine (NORVASC) 5 MG tablet Take 5 mg by mouth daily.   Yes [provider]  aspirin EC 81 MG EC tablet Take 1 tablet (81 mg total) by mouth daily. 01/21/13  Yes Doree Fudge M, PA-C  atorvastatin (LIPITOR) 20 MG tablet Take 20  mg by mouth daily.   Yes [provider]  carvedilol (COREG) 6.25 MG tablet Take 1 tablet (6.25 mg total) by mouth 2 (two) times daily with a meal. 10/21/15  Yes Lyn Records, MD  clopidogrel (PLAVIX) 75 MG tablet Take 1 tablet (75 mg total) by mouth daily. 04/19/17  Yes Leone Brand, NP  fluticasone (FLONASE) 50 MCG/ACT nasal spray Place 1 spray into both nostrils daily.  06/29/16  Yes [provider]  folic acid (FOLVITE) 1 MG tablet Take 3 mg by mouth daily.   Yes [provider]  furosemide (LASIX) 20 MG tablet Take 40 mg by mouth daily.   Yes [provider]  lactose free nutrition (BOOST) LIQD Take 237 mLs daily by mouth.   Yes [provider]  levothyroxine (SYNTHROID,  LEVOTHROID) 50 MCG tablet Take 50 mcg by mouth daily. 11/06/14  Yes [provider]  loratadine (CLARITIN) 10 MG tablet Take 10 mg by mouth daily. 06/29/16  Yes [provider]  losartan (COZAAR) 25 MG tablet Take 1 tablet (25 mg total) by mouth daily. 04/19/17  Yes Leone Brand, NP  methotrexate 2.5 MG tablet Take 25 mg by mouth every Friday.    Yes [provider]  nitroGLYCERIN (NITROSTAT) 0.4 MG SL tablet PLACE 1 TABLET (0.4 MG TOTAL) UNDER THE TONGUE EVERY 5 MINUTES AS NEEDED FOR CHEST PAIN 12/21/16  Yes Lyn Records, MD  pantoprazole (PROTONIX) 40 MG tablet Take 1 tablet (40 mg total) by mouth daily. 02/18/17  Yes Azalee Course, PA  predniSONE (DELTASONE) 5 MG tablet Take 5 mg by mouth daily. 04/07/15  Yes [provider]    Physical Exam: Vitals:   08/03/17 0515 08/03/17 0530 08/03/17 0545 08/03/17 0600  BP: (!) 100/48 100/78 (!) 114/51 (!) 106/53  Pulse: 78 73 75 71  Resp: (!) 21 20 (!) 21 (!) 22  Temp:      TempSrc:      SpO2: 99% 100% 100% 100%  Weight:      Height:          Constitutional: Moderately built and nourished. Vitals:   08/03/17 0515 08/03/17 0530 08/03/17 0545 08/03/17 0600  BP: (!) 100/48 100/78 (!) 114/51 (!)  106/53  Pulse: 78 73 75 71  Resp: (!) 21 20 (!) 21 (!) 22  Temp:      TempSrc:      SpO2: 99% 100% 100% 100%  Weight:      Height:       Eyes: Anicteric no pallor. ENMT: No discharge from the ears eyes nose or mouth. Neck: No mass palpated no neck rigidity. Respiratory: No rhonchi or crepitations. Cardiovascular: S1-S2 heard no murmurs appreciated. Abdomen: Soft nontender bowel sounds present.  No guarding or rigidity. Musculoskeletal: No edema.  No joint effusion. Skin: No rash.  Skin appears warm. Neurologic: Alert awake oriented to his name and place.  Moves all extremity's 5 x 5.  Hard of hearing. Psychiatric: Hard of hearing others oriented to his name and place.   Labs on Admission: I have personally reviewed following labs and imaging studies  CBC: Recent Labs  Lab 08/03/17 0242  WBC 9.1  NEUTROABS 5.8  HGB 11.1*  HCT 36.1*  MCV 85.7  PLT 124*   Basic Metabolic Panel: Recent Labs  Lab 08/03/17 0245  NA 134*  K 4.4  CL 97*  CO2 27  GLUCOSE 137*  BUN 33*  CREATININE 1.38*  CALCIUM 8.6*   GFR: Estimated Creatinine Clearance: 35.3 mL/min (A) (by C-G formula based on SCr of 1.38 mg/dL (H)). Liver Function Tests: Recent Labs  Lab 08/03/17 0245  AST 35  ALT 43  ALKPHOS 170*  BILITOT 0.8  PROT 6.5  ALBUMIN 3.3*   No results for input(s): LIPASE, AMYLASE in the last 168 hours. No results for input(s): AMMONIA in the last 168 hours. Coagulation Profile: No results for input(s): INR, PROTIME in the last 168 hours. Cardiac Enzymes: No results for input(s): CKTOTAL, CKMB, CKMBINDEX, TROPONINI in the last 168 hours. BNP (last 3 results) No results for input(s): PROBNP in the last 8760 hours. HbA1C: No results for input(s): HGBA1C in the last 72 hours. CBG: No results for input(s): GLUCAP in the last 168 hours. Lipid Profile: No results for input(s): CHOL, HDL, LDLCALC, TRIG, CHOLHDL,  LDLDIRECT in the last 72 hours. Thyroid Function Tests: No results  for input(s): TSH, T4TOTAL, FREET4, T3FREE, THYROIDAB in the last 72 hours. Anemia Panel: No results for input(s): VITAMINB12, FOLATE, FERRITIN, TIBC, IRON, RETICCTPCT in the last 72 hours. Urine analysis:    Component Value Date/Time   COLORURINE YELLOW 08/03/2017 0352   APPEARANCEUR HAZY (A) 08/03/2017 0352   LABSPEC 1.016 08/03/2017 0352   PHURINE 6.0 08/03/2017 0352   GLUCOSEU NEGATIVE 08/03/2017 0352   HGBUR MODERATE (A) 08/03/2017 0352   BILIRUBINUR NEGATIVE 08/03/2017 0352   KETONESUR NEGATIVE 08/03/2017 0352   PROTEINUR 100 (A) 08/03/2017 0352   UROBILINOGEN 4.0 (H) 11/14/2014 1849   NITRITE NEGATIVE 08/03/2017 0352   LEUKOCYTESUR NEGATIVE 08/03/2017 0352   Sepsis Labs: @LABRCNTIP (procalcitonin:4,lacticidven:4) )No results found for this or any previous visit (from the past 240 hour(s)).   Radiological Exams on Admission: Dg Chest Port 1 View  Result Date: 08/03/2017 CLINICAL DATA:  Altered mental status EXAM: PORTABLE CHEST 1 VIEW COMPARISON:  02/22/2017 FINDINGS: Stable cardiomegaly with tortuous atherosclerotic aorta. Interstitial pulmonary edema is noted. The patient is status post CABG with aortic valvular replacement. Colonic interposition over the liver shadow is identified. No acute pulmonary consolidation. There is minimal bibasilar atelectasis. No acute osseous abnormality. Degenerative change noted about both shoulders. IMPRESSION: Stable cardiomegaly with post CABG change. Tortuous atherosclerotic nonaneurysmal aorta. Interstitial edema is noted since prior. Electronically Signed   By: 02/24/2017 M.D.   On: 08/03/2017 03:25    EKG: Independently reviewed.  Sinus rhythm with LBBB.  Assessment/Plan Principal Problem:   SIRS (systemic inflammatory response syndrome) (HCC) Active Problems:   Rheumatoid arthritis (HCC)   S/P TAVR (transcatheter aortic valve replacement)   Chronic combined systolic and diastolic congestive heart failure (HCC)   CAD (coronary artery  disease)   Essential hypertension   Acute encephalopathy    1. SIRS -source not clear.  I have ordered CT head chest and abdomen now.  Patient will be continued on vancomycin and Zosyn.  Follow blood cultures lactate levels pro calcitonin levels influenza PCR.  Continue with gentle hydration.  Note that patient has history of CHF.  No signs of meningitis since patient is become more alert awake denies any headache or neck pain. 2. History of rheumatoid arthritis will hold methotrexate and keep patient on IV hydrocortisone since patient is on long-term steroids. 3. History of TAVR -follow blood cultures and respiratory status. 4. History of CAD status post CABG has had non-ST elevation MI in November last year and at the time cardiac cath was done and conservative management was opted. 5. Acute encephalopathy likely from fever CT head is pending.  On my exam patient's mental status has come back to baseline. No signs of meningitis/encephalitis. 6. Chronic combined systolic and diastolic heart failure last EF measured in November 2018 was 35 to 40%.  Holding Lasix and Cozaar due to hypotension and sepsis picture. 7. History of hypertension we will hold antihypertensives due to low normal blood pressure due to possible developing sepsis. 8. Chronic kidney disease stage III.  Follow metabolic panel. 9. Chronic anemia and Thrombocytopenia -  follow CBC with differential. 10. History of hypothyroidism on Synthroid. 11. Hyperlipidemia on statins.   DVT prophylaxis: Lovenox. Code Status: Full code. Family Communication: Patient's son. Disposition Plan: Home. Consults called: None. Admission status: Inpatient.   December 2018 MD [facility] Pager [phone].  If 7PM-7AM, please contact night-coverage [email or web address] Password [facility]  08/03/2017, 6:26 AM

## 2017-08-03 NOTE — ED Notes (Signed)
Patient transported to CT 

## 2017-08-03 NOTE — Consult Note (Addendum)
Cardiology Consultation:   Patient ID: AMONTAE NG; [id number]; 11-13-1927   Admit date: 08/03/2017 Date of Consult: 08/03/2017  Primary Care Provider: [name], MD Primary Cardiologist: Dr Katrinka Blazing 06/2016 Primary Electrophysiologist:  n/a   Patient Profile:   Hunter Choi is a 84 y.o. male with a hx of CABG 1999, NSTEMI 01/2017 w/ cath results below, TAVR 2014, AAA, CKD, S-D-DHF, RA, BPH, GERD, HOH, PAF, CVA, LBBB, anemia, thrombocytopenia, who is being seen today for the evaluation of elevated troponin at the request of Dr Toniann Fail.  History of Present Illness:   Mr. Miklos was admitted early this am for temp 104 and confusion, dx SIRS, source unclear.  Troponin was elevated, cards asked to see.   Mr. Scallon is awake and alert.  He was reported to have problems with confusion.  However, he is so hard of hearing, it is difficult to determine the difference between him not understanding the speech and being confused about the question.  He has bilateral hearing aids in place.  Mr. Darco currently says he feels okay.  He denies pain or shortness of breath.  I was not able to get him to understand that I was asking about what was going on prior to right now. I am not sure if that was confusion or not.   Right now, he states he is tired and sleepy.  He would like to nap.  He would like the lights turned off. No complaints.  Per nursing staff, he has not had any complaints.  He was previously too sleepy to take medications, but now is more alert.  He has had IV steroids, a Tylenol suppository, IV antibiotics, DVT Lovenox, an aspirin suppository, and 800 cc of IV fluids.  He also had Reglan 10 mg IV.  His systolic blood pressure has improved from the 80s to the 100s.  His heart rate is in the low 50s at rest.   Past Medical History:  Diagnosis Date  . AAA (abdominal aortic aneurysm) (HCC)    Dr. Ashley Royalty  . Aortic stenosis, severe    a. s/p TAVR  . Blind left eye   .  BPH (benign prostatic hypertrophy)    Dr. Retta Diones  . CAD (coronary artery disease) of bypass graft    Chronic occlusion of saphenous vein graft to RCA  . Coronary artery disease    Left main disease and severe 3-vessel CAD  . Diastolic heart failure (HCC)   . Embolism involving retinal artery   . GERD (gastroesophageal reflux disease)   . Hard of hearing   . Hypertension   . Hypothyroidism   . Iliac artery aneurysm (HCC)    Dr. Edilia Bo  . PAF (paroxysmal atrial fibrillation) (HCC)   . 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 12/2012: mm Stephannie Peters XT transcatheter heart valve placed via transapical approach  . Stroke [facility])    mini stroke effective his eye left    Past Surgical History:  Procedure Laterality Date  . BIOPSY PROSTATE  2005  . BUNIONECTOMY WITH HAMMERTOE RECONSTRUCTION Bilateral   . CARDIAC CATHETERIZATION    . CARDIAC CATHETERIZATION N/A 05/07/2015   Procedure: Right Heart Cath and Coronary/Graft Angiography;  Surgeon: Lyn Records, MD;  Location: [facility] INVASIVE CV LAB;  Service: Cardiovascular;  Laterality: N/A;  . CAROTID ENDARTERECTOMY Right Jan. 2, 1997  . CATARACT EXTRACTION W/ INTRAOCULAR LENS IMPLANT Right   . CHOLECYSTECTOMY N/A 01/28/2015   Procedure: LAPAROSCOPIC CHOLECYSTECTOMY WITH INTRAOPERATIVE CHOLANGIOGRAM;  Surgeon: Gaynelle Adu, MD;  Location: [facility] OR;  Service: General;  Laterality: N/A;  . COLONOSCOPY    . CORONARY ARTERY BYPASS GRAFT  01/1998   Dr. Sheliah Plane; LIMA to LAD, SVG to D1, SVG to LCx, SVG to RCA, open saphenous vein harvest via right lower extremity  . HARVEST BONE GRAFT  1982   FOR THE  ANKLE  . HEMIARTHROPLASTY HIP Left    AFTER HIP FX  . INTRAOPERATIVE TRANSESOPHAGEAL ECHOCARDIOGRAM N/A 01/15/2013   Procedure: INTRAOPERATIVE TRANSESOPHAGEAL ECHOCARDIOGRAM;  Surgeon: [name], MD;  Location: [facility] OR;  Service: Open Heart Surgery;  Laterality: N/A;  . JOINT REPLACEMENT    . LEFT AND RIGHT HEART  CATHETERIZATION WITH CORONARY ANGIOGRAM N/A 12/20/2012   Procedure: LEFT AND RIGHT HEART CATHETERIZATION WITH CORONARY ANGIOGRAM;  Surgeon: Lesleigh Noe, MD;  Location: [facility] CATH LAB;  Service: Cardiovascular;  Laterality: N/A;  . LEFT HEART CATH AND CORS/GRAFTS ANGIOGRAPHY N/A 02/14/2017   Procedure: LEFT HEART CATH AND CORS/GRAFTS ANGIOGRAPHY;  Surgeon: Marykay Lex, MD;  Location: [facility] INVASIVE CV LAB;  Service: Cardiovascular;  Laterality: N/A;  . SKIN GRAFTS  1968  . TOTAL HIP ARTHROPLASTY Left 2007  . TOTAL KNEE ARTHROPLASTY  1992-2002   right-left  . TRANSCATHETER AORTIC VALVE REPLACEMENT, TRANSAPICAL N/A 01/15/2013   Procedure: TRANSCATHETER AORTIC VALVE REPLACEMENT, TRANSAPICAL;  Surgeon: [name], MD;  Location: [facility] OR;  Service: Open Heart Surgery;  Laterality: N/A;  . TRANSURETHRAL RESECTION OF PROSTATE  2012     Prior to Admission medications   Medication Sig Start Date End Date Taking? Authorizing Provider  acetaminophen (TYLENOL) 325 MG tablet Take 2 tablets (650 mg total) by mouth every 6 (six) hours as needed for mild pain, moderate pain, fever or headache. 02/03/15  Yes Hongalgi, Maximino Greenland, MD  amLODipine (NORVASC) 5 MG tablet Take 5 mg by mouth daily.   Yes [provider]  aspirin EC 81 MG EC tablet Take 1 tablet (81 mg total) by mouth daily. 01/21/13  Yes Doree Fudge M, PA-C  atorvastatin (LIPITOR) 20 MG tablet Take 20 mg by mouth daily.   Yes [provider]  carvedilol (COREG) 6.25 MG tablet Take 1 tablet (6.25 mg total) by mouth 2 (two) times daily with a meal. 10/21/15  Yes Lyn Records, MD  clopidogrel (PLAVIX) 75 MG tablet Take 1 tablet (75 mg total) by mouth daily. 04/19/17  Yes Leone Brand, NP  fluticasone (FLONASE) 50 MCG/ACT nasal spray Place 1 spray into both nostrils daily.  06/29/16  Yes [provider]  folic acid (FOLVITE) 1 MG tablet Take 3 mg by mouth daily.   Yes [provider]  furosemide (LASIX) 20 MG  tablet Take 40 mg by mouth daily.   Yes [provider]  lactose free nutrition (BOOST) LIQD Take 237 mLs daily by mouth.   Yes [provider]  levothyroxine (SYNTHROID, LEVOTHROID) 50 MCG tablet Take 50 mcg by mouth daily. 11/06/14  Yes [provider]  loratadine (CLARITIN) 10 MG tablet Take 10 mg by mouth daily. 06/29/16  Yes [provider]  losartan (COZAAR) 25 MG tablet Take 1 tablet (25 mg total) by mouth daily. 04/19/17  Yes Leone Brand, NP  methotrexate 2.5 MG tablet Take 25 mg by mouth every Friday.    Yes [provider]  nitroGLYCERIN (NITROSTAT) 0.4 MG SL tablet PLACE 1 TABLET (0.4 MG TOTAL) UNDER THE TONGUE EVERY 5 MINUTES AS NEEDED FOR CHEST PAIN  12/21/16  Yes Lyn Records, MD  pantoprazole (PROTONIX) 40 MG tablet Take 1 tablet (40 mg total) by mouth daily. 02/18/17  Yes Azalee Course, PA  predniSONE (DELTASONE) 5 MG tablet Take 5 mg by mouth daily. 04/07/15  Yes [provider]    Inpatient Medications: Scheduled Meds: . aspirin EC  81 mg Oral Daily  . atorvastatin  20 mg Oral Daily  . clopidogrel  75 mg Oral Daily  . enoxaparin (LOVENOX) injection  40 mg Subcutaneous Q24H  . fluticasone  1 spray Each Nare Daily  . folic acid  3 mg Oral Daily  . hydrocortisone sod succinate (SOLU-CORTEF) inj  50 mg Intravenous Q6H  . lactose free nutrition  237 mL Oral Q24H  . levothyroxine  50 mcg Oral QAC breakfast  . pantoprazole  40 mg Oral Daily   Continuous Infusions: . sodium chloride 100 mL/hr at 08/03/17 0758  . piperacillin-tazobactam (ZOSYN)  IV 3.375 g (08/03/17 0957)  . [START ON 08/04/2017] vancomycin     PRN Meds: acetaminophen **OR** acetaminophen, nitroGLYCERIN, ondansetron **OR** ondansetron (ZOFRAN) IV  Allergies:    Allergies  Allergen Reactions  . Ciprofloxacin Other (See Comments)    REACTION: mild delirium    Social History:   Social History   Socioeconomic History  . Marital status: Married    Spouse  name: Not on file  . Number of children: Not on file  . Years of education: Not on file  . Highest education level: Not on file  Occupational History  . Occupation: Retired  Engineer, production  . Financial resource strain: Not on file  . Food insecurity:    Worry: Not on file    Inability: Not on file  . Transportation needs:    Medical: Not on file    Non-medical: Not on file  Tobacco Use  . Smoking status: Former Smoker    Packs/day: 0.50    Years: 47.00    Pack years: 23.50    Types: Cigarettes    Last attempt to quit: 03/28/1958    Years since quitting: 59.3  . Smokeless tobacco: Former Neurosurgeon    Types: Chew  . Tobacco comment: ONLY USED 2 YEARS  Substance and Sexual Activity  . Alcohol use: No  . Drug use: No  . Sexual activity: Not on file  Lifestyle  . Physical activity:    Days per week: Not on file    Minutes per session: Not on file  . Stress: Not on file  Relationships  . Social connections:    Talks on phone: Not on file    Gets together: Not on file    Attends religious service: Not on file    Active member of club or organization: Not on file    Attends meetings of clubs or organizations: Not on file    Relationship status: Not on file  . Intimate partner violence:    Fear of current or ex partner: Not on file    Emotionally abused: Not on file    Physically abused: Not on file    Forced sexual activity: Not on file  Other Topics Concern  . Not on file  Social History Narrative  . Not on file    Family History:   Family History  Problem Relation Age of Onset  . Heart attack Mother   . Heart disease Mother        Before age 43  . Hyperlipidemia Mother   . Hypertension Mother   .  Hypertension Son   . Heart disease Father        AAA-   . Hyperlipidemia Father   . Heart attack Father   . Hypertension Father   . Heart disease Brother        Before age 38  . Hyperlipidemia Brother   . Heart attack Brother   . Hypertension Brother    Family Status:   Family Status  Relation Name Status  . Mother  Deceased at age 53  . Son  Alive  . Father  Deceased at age 31  . Brother  Deceased at age 52  . Son  Alive  . MGM  Deceased  . MGF  Deceased  . PGM  Deceased  . PGF  Deceased    ROS:  Please see the history of present illness.  No other information is available due to patient condition.  Physical Exam/Data:   Vitals:   08/03/17 0930 08/03/17 1000 08/03/17 1015 08/03/17 1030  BP: (!) 86/43 (!) 88/47 (!) 88/44 (!) 86/44  Pulse: 60 (!) 58 (!) 56 (!) 56  Resp: 19 (!) 24 19 19   Temp:      TempSrc:      SpO2: 95% 98% 99% 99%  Weight:      Height:        Intake/Output Summary (Last 24 hours) at 08/03/2017 1502 Last data filed at 08/03/2017 0757 Gross per 24 hour  Intake 1000 ml  Output -  Net 1000 ml   Filed Weights   08/03/17 0255  Weight: 150 lb (68 kg)   Body mass index is 23.49 kg/m.  General:  Well nourished, well developed, in no acute distress HEENT: normal Lymph: no adenopathy Neck: minimal JVD Endocrine:  No thryomegaly Vascular: No carotid bruits; 4/4 extremity pulses 2+, without bruits  Cardiac:  normal S1, S2; RRR; no murmur  Lungs: rales R base, no wheezing, rhonchi    Abd: soft, nontender, no hepatomegaly  Ext: no edema Musculoskeletal:  No deformities, BUE and BLE strength normal and equal Skin: warm and dry  Neuro:  CNs 2-12 intact, no focal abnormalities noted Psych:  Normal affect   EKG:  The EKG was personally reviewed and demonstrates:  05/09, SR, HR 63, LBBB is old Telemetry:  Telemetry was personally reviewed and demonstrates:  SR, sinus brady w/ PVCs  Relevant CV Studies:  ECHO: 02/14/2017 - Left ventricle: The cavity size was normal. Systolic function was   moderately reduced. The estimated ejection fraction was in the   range of 35% to 40%. Mild hypokinesis of the apical myocardium;   consistent with ischemia in the distribution of the left anterior   descending coronary artery. Severe  hypokinesis and scarring of   the basal-midinferolateral and inferior myocardium; consistent   with infarction in the distribution of the right coronary or left   circumflex coronary artery. Doppler parameters are consistent   with abnormal left ventricular relaxation (grade 1 diastolic   dysfunction). Doppler parameters are consistent with elevated   mean left atrial filling pressure. - Aortic valve: A stent-valve (TAVR) bioprosthesis was present and   functioning normally. There was trivial regurgitation. Valve area   (VTI): 2.81 cm^2. Valve area (Vmax): 2.97 cm^2. Valve area   (Vmean): 2.85 cm^2. - Mitral valve: There was mild regurgitation. - Left atrium: The atrium was mildly dilated. - Right atrium: The atrium was mildly dilated. Impressions: - Left ventricular systolic function has improved compared to   October 2017 report.  CATH:  02/14/2017  Mid LM to Ost LAD lesion is 65% stenosed.  Ost Cx to Prox Cx lesion is 90% stenosed. Mid Cx lesion is 100% stenosed.  Ost LAD to Prox LAD lesion is 99% stenosed. Prox LAD to Mid LAD lesion is 100% stenosed.  LIMA-LAD graft is widely patent  Ost 1st Diag lesion is 99% stenosed.  SVG-Diag1/Ramus -very large, patulous/ectatic vessel. The grafted vessel is smooth without disease.  SVG-OM - prox Graft lesion is 70% focal, thrombotic lesion in a valve segment.  Prox RCA lesion is 75% stenosed. Dist RCA lesion is 100% stenosed with 100% stenosed side branch in Post Atrio.  SVG-distal RCA origin lesion is 100% stenosed.  There is severe left ventricular systolic dysfunction.  LV end diastolic pressure is moderately elevated.  The left ventricular ejection fraction is 25-35% by visual estimate.   The patient has severe native vessel disease with essentially no native Left Coronary Artery vessels remaining patent.  The native RCA has extensive disease in the proximal segment and then appears to be occluded at the bifurcation of the  posterolateral branch and PDA.  The graft to the RPDA is known to be occluded. There are 2 remaining vein grafts are patent, however the vein graft to OM1 focal thrombotic lesion and what appears to be a graft.  The remainder of this graft as well as the SVG-Diag/RI are very patulous/ectatic vessels almost aneurysmal.  Intervention on a 84 year old vein graft with this much disease is likely fraught with high risk for complications.  Given his extreme cardiomyopathy, I fear that attempted PCI of this vessel with potential occlusion of the graft and the downstream vessel would lead to life-threatening hemodynamic instability.  I reviewed the images with Dr. Excell Seltzer, he and I both believe that the graft is too far disease to risk attempted PCI.  Therefore the plan will be to return the patient to the nursing unit, run heparin for this 48 hours and continue current medical management with addition of Plavix.  Plan: The patient will move to the holding area for sheath to be removed with manual pressure. Restart IV heparin 8 hours after sheath removal. I have little with 300 mg Plavix today and then starts in her milligrams Plavix tomorrow. Based on patient's symptoms, would need to titrate antianginal as well as heart failure medications. Diagnostic Diagram       Laboratory Data:  Chemistry Recent Labs  Lab 08/03/17 0245 08/03/17 0623  NA 134* 134*  K 4.4 4.2  CL 97* 100*  CO2 27 25  GLUCOSE 137* 100*  BUN 33* 34*  CREATININE 1.38* 1.46*  CALCIUM 8.6* 8.0*  GFRNONAA 44* 41*  GFRAA 51* 48*  ANIONGAP 10 9    Lab Results  Component Value Date   ALT 35 08/03/2017   AST 32 08/03/2017   ALKPHOS 136 (H) 08/03/2017   BILITOT 0.9 08/03/2017   Hematology Recent Labs  Lab 08/03/17 0242 08/03/17 0623  WBC 9.1 9.8  RBC 4.21* 3.80*  HGB 11.1* 10.1*  HCT 36.1* 32.8*  MCV 85.7 86.3  MCH 26.4 26.6  MCHC 30.7 30.8  RDW 17.6* 18.0*  PLT 124* 99*   Cardiac Enzymes Recent Labs  Lab  08/03/17 0623 08/03/17 1317  TROPONINI 1.23* 0.92*   No results for input(s): TROPIPOC in the last 168 hours.  BNPNo results for input(s): BNP, PROBNP in the last 168 hours.  DDimer No results for input(s): DDIMER in the last 168 hours. TSH:  Lab Results  Component  Value Date   TSH 3.050 05/07/2015   Lipids: Lab Results  Component Value Date   CHOL 84 02/21/2014   HDL 27 (L) 02/21/2014   LDLCALC 38 02/21/2014   TRIG 93 02/21/2014   CHOLHDL 3.1 02/21/2014   HgbA1c: Lab Results  Component Value Date   HGBA1C 6.0 (H) 05/01/2015   Magnesium:  Magnesium  Date Value Ref Range Status  10/05/2015 2.0 1.7 - 2.4 mg/dL Final   Urinalysis    Component Value Date/Time   COLORURINE YELLOW 08/03/2017 0352   APPEARANCEUR HAZY (A) 08/03/2017 0352   LABSPEC 1.016 08/03/2017 0352   PHURINE 6.0 08/03/2017 0352   GLUCOSEU NEGATIVE 08/03/2017 0352   HGBUR MODERATE (A) 08/03/2017 0352   BILIRUBINUR NEGATIVE 08/03/2017 0352   KETONESUR NEGATIVE 08/03/2017 0352   PROTEINUR 100 (A) 08/03/2017 0352   UROBILINOGEN 4.0 (H) 11/14/2014 1849   NITRITE NEGATIVE 08/03/2017 0352   LEUKOCYTESUR NEGATIVE 08/03/2017 0352      Radiology/Studies:  Ct Abdomen Pelvis Wo Contrast  Result Date: 08/03/2017 CLINICAL DATA:  Altered mental status, fever, cough, vomiting EXAM: CT CHEST, ABDOMEN AND PELVIS WITHOUT CONTRAST TECHNIQUE: Multidetector CT imaging of the chest, abdomen and pelvis was performed following the standard protocol without IV contrast. COMPARISON:  08/03/2017, 11/14/2014 FINDINGS: CT CHEST FINDINGS Cardiovascular: Extensive atherosclerosis of the thoracic aorta without significant aneurysm. No mediastinal hemorrhage or hematoma. Previous coronary bypass and TAVR noted. Heart is enlarged. No pericardial effusion. Mediastinum/Nodes: No enlarged mediastinal, hilar, or axillary lymph nodes. Thyroid gland, trachea, and esophagus demonstrate no significant findings. Lungs/Pleura: Subcentimeter  peripheral and subpleural calcified granuloma in the right upper lobe, image 48 series 9. Bibasilar atelectasis noted. Respiratory motion artifact through the lower lobes. No significant focal pneumonia, collapse or consolidation. No interstitial process or edema. No other pleural abnormality, pleural effusion or pneumothorax. Trachea central airways appear patent. Musculoskeletal: Degenerative changes noted spine. No acute compression fracture. CT ABDOMEN PELVIS FINDINGS Hepatobiliary: No focal liver abnormality is seen. Status post cholecystectomy. No biliary dilatation. Pancreas: Unremarkable. No pancreatic ductal dilatation or surrounding inflammatory changes. Spleen: Normal in size without focal abnormality. Adrenals/Urinary Tract: Normal adrenal glands. No renal obstruction, obstructive uropathy, hydronephrosis, hydroureter. Bladder wall thickening noted appearing chronic with small bladder diverticula. Stomach/Bowel: Negative for bowel obstruction, significant dilatation, ileus, free air. No fluid collection or abscess. Scattered colonic diverticulosis. No acute inflammatory process. Vascular/Lymphatic: Aorto bi-iliac aneurysms again noted without evidence of acute rupture or retroperitoneal hemorrhage. Distal aortic aneurysm measures 5.3 cm in maximal diameter, image 85 series 7. Right common iliac aneurysm measures 5.9 cm and left common iliac aneurysm measures 4 cm. Right internal iliac aneurysm measures 4.8 cm. These all have enlarged compared 2016. Right common femoral aneurysm measures 2.1 cm. Reproductive: No significant finding by CT. Other: No abdominal wall hernia or abnormality. No abdominopelvic ascites. Musculoskeletal: Left hip arthroplasty creates artifact. Degenerative changes noted spine. No acute compression fracture IMPRESSION: Thoracic aortic atherosclerosis without aneurysm. Cardiomegaly with bibasilar atelectasis. No acute intrathoracic finding. Enlargement of the aorto bi-iliac  aneurysms compared 2016 without evidence of acute rupture or retroperitoneal hemorrhage. Recommend followup by abdomen and pelvis CTA in 3-6 months, and vascular surgery referral/consultation if not already obtained. This recommendation follows ACR consensus guidelines: White Paper of the ACR Incidental Findings Committee II on Vascular Findings. J Am Coll Radiol 2013; 10:789-794. Interval cholecystectomy No other acute intra-abdominal or pelvic finding by noncontrast CT. Colonic diverticulosis Chronic appearing bladder wall thickening with scattered diverticula Electronically Signed   By: Judie Petit.  Shick  M.D.   On: 08/03/2017 07:27   Ct Head Wo Contrast  Result Date: 08/03/2017 CLINICAL DATA:  Altered level of consciousness EXAM: CT HEAD WITHOUT CONTRAST TECHNIQUE: Contiguous axial images were obtained from the base of the skull through the vertex without intravenous contrast. COMPARISON:  02/12/2017 FINDINGS: Brain: No evidence of acute infarction, hemorrhage, hydrocephalus, extra-axial collection or mass lesion/mass effect. Small remote infarcts along the right more than left high cerebral convexities and bilateral cerebellum. Chronic small vessel ischemic type gliosis in the cerebral white matter. Generalized atrophy, stable. Vascular: Atherosclerotic calcification Skull: No acute or aggressive finding Sinuses/Orbits: Chronic bilateral mastoid opacification with negative nasopharynx. Chronic low calcifications and right cataract resection IMPRESSION: 1. No acute finding or change from 2018. 2. Chronic small vessel ischemic injury. 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 Electronically Signed   By: Marnee Spring M.D.   On: 08/03/2017 07:16   Ct Chest Wo Contrast  Result Date: 08/03/2017 CLINICAL DATA:  Altered mental status, fever, cough, vomiting EXAM: CT CHEST, ABDOMEN AND PELVIS WITHOUT CONTRAST TECHNIQUE: Multidetector CT imaging of the chest, abdomen and pelvis was  performed following the standard protocol without IV contrast. COMPARISON:  08/03/2017, 11/14/2014 FINDINGS: CT CHEST FINDINGS Cardiovascular: Extensive atherosclerosis of the thoracic aorta without significant aneurysm. No mediastinal hemorrhage or hematoma. Previous coronary bypass and TAVR noted. Heart is enlarged. No pericardial effusion. Mediastinum/Nodes: No enlarged mediastinal, hilar, or axillary lymph nodes. Thyroid gland, trachea, and esophagus demonstrate no significant findings. Lungs/Pleura: Subcentimeter peripheral and subpleural calcified granuloma in the right upper lobe, image 48 series 9. Bibasilar atelectasis noted. Respiratory motion artifact through the lower lobes. No significant focal pneumonia, collapse or consolidation. No interstitial process or edema. No other pleural abnormality, pleural effusion or pneumothorax. Trachea central airways appear patent. Musculoskeletal: Degenerative changes noted spine. No acute compression fracture. CT ABDOMEN PELVIS FINDINGS Hepatobiliary: No focal liver abnormality is seen. Status post cholecystectomy. No biliary dilatation. Pancreas: Unremarkable. No pancreatic ductal dilatation or surrounding inflammatory changes. Spleen: Normal in size without focal abnormality. Adrenals/Urinary Tract: Normal adrenal glands. No renal obstruction, obstructive uropathy, hydronephrosis, hydroureter. Bladder wall thickening noted appearing chronic with small bladder diverticula. Stomach/Bowel: Negative for bowel obstruction, significant dilatation, ileus, free air. No fluid collection or abscess. Scattered colonic diverticulosis. No acute inflammatory process. Vascular/Lymphatic: Aorto bi-iliac aneurysms again noted without evidence of acute rupture or retroperitoneal hemorrhage. Distal aortic aneurysm measures 5.3 cm in maximal diameter, image 85 series 7. Right common iliac aneurysm measures 5.9 cm and left common iliac aneurysm measures 4 cm. Right internal iliac  aneurysm measures 4.8 cm. These all have enlarged compared 2016. Right common femoral aneurysm measures 2.1 cm. Reproductive: No significant finding by CT. Other: No abdominal wall hernia or abnormality. No abdominopelvic ascites. Musculoskeletal: Left hip arthroplasty creates artifact. Degenerative changes noted spine. No acute compression fracture IMPRESSION: Thoracic aortic atherosclerosis without aneurysm. Cardiomegaly with bibasilar atelectasis. No acute intrathoracic finding. Enlargement of the aorto bi-iliac aneurysms compared 2016 without evidence of acute rupture or retroperitoneal hemorrhage. Recommend followup by abdomen and pelvis CTA in 3-6 months, and vascular surgery referral/consultation if not already obtained. This recommendation follows ACR consensus guidelines: White Paper of the ACR Incidental Findings Committee II on Vascular Findings. J Am Coll Radiol 2013; 10:789-794. Interval cholecystectomy No other acute intra-abdominal or pelvic finding by noncontrast CT. Colonic diverticulosis Chronic appearing bladder wall thickening with scattered diverticula Electronically Signed   By: Judie Petit.  Shick M.D.   On: 08/03/2017 07:27   Dg Chest  Port 1 View  Result Date: 08/03/2017 CLINICAL DATA:  Altered mental status EXAM: PORTABLE CHEST 1 VIEW COMPARISON:  02/22/2017 FINDINGS: Stable cardiomegaly with tortuous atherosclerotic aorta. Interstitial pulmonary edema is noted. The patient is status post CABG with aortic valvular replacement. Colonic interposition over the liver shadow is identified. No acute pulmonary consolidation. There is minimal bibasilar atelectasis. No acute osseous abnormality. Degenerative change noted about both shoulders. IMPRESSION: Stable cardiomegaly with post CABG change. Tortuous atherosclerotic nonaneurysmal aorta. Interstitial edema is noted since prior. Electronically Signed   By: Tollie Eth M.D.   On: 08/03/2017 03:25    Assessment and Plan:   1. Elevated troponin - ECG  with left bundle branch block is not helpful -Patient with no known history of chest pain. - He has an acute illness, will check a CK-MB.  2.  Chronic combined systolic and diastolic CHF -Volume status was good at his last office visit in January and his weight was 150 pounds. - no edema on chest CT -Hydration has been tailored to keep his volume status stable.  - Follow daily weights, strict intake/output and symptoms.  3. S/p TAVR -No significant murmur, valve was stable on echocardiogram 01/2017  4.  Bilateral iliac aneurysms: - Followed by Dr Edilia Bo - per Dr Adele Dan last note, pt and son have refused surgery - f/u with Dr Edilia Bo  Otherwise, per IM Principal Problem:   SIRS (systemic inflammatory response syndrome) (HCC) Active Problems:   Rheumatoid arthritis (HCC)   S/P TAVR (transcatheter aortic valve replacement)   Chronic combined systolic and diastolic congestive heart failure (HCC)   CAD (coronary artery disease)   Essential hypertension   Acute encephalopathy     For questions or updates, please contact [facility] Please consult [email or web address] for contact info under Cardiology/STEMI.   SignedTheodore Demark, PA-C  08/03/2017 3:02 PM

## 2017-08-03 NOTE — Progress Notes (Signed)
08/03/2017 patient came in from the emergency room to 2W. He is alert to self and have some confusion. Patient was place on telemetry and receive CHG bath and MRSA swab. He has bruises on bilateral arms, dryness on feet and purplish discoloration on feet. [facility] RN.

## 2017-08-03 NOTE — ED Notes (Signed)
Admitting paged x2 regarding troponin elevation and hypotension. Received verbal orders for 250 ml bolus NS and EKG. Orders performed accordingly.

## 2017-08-03 NOTE — ED Provider Notes (Signed)
[facility] EMERGENCY DEPARTMENT Provider Note   CSN: [id number] Arrival date & time: 08/03/17  0235     History   Chief Complaint Chief Complaint  Patient presents with  . Fever  . Altered Mental Status    HPI Hunter Choi is a 84 y.o. male.  The history is provided by the EMS personnel. The history is limited by the condition of the patient (Altered mental status).  He has history of hypertension, diastolic heart failure, aortic stenosis status post TAVR, coronary artery disease, paroxysmal atrial fibrillation and is brought in by ambulance because of altered mental status.  EMS states patient started running a fever tonight, but family noted he was getting confused yesterday.  He has history of urinary tract infections.  EMS does report nausea and vomiting in route.  Past Medical History:  Diagnosis Date  . AAA (abdominal aortic aneurysm) (HCC)    Dr. Ashley Royalty  . Aortic stenosis, severe    a. s/p TAVR  . Blind left eye   . BPH (benign prostatic hypertrophy)    Dr. Retta Diones  . CAD (coronary artery disease) of bypass graft    Chronic occlusion of saphenous vein graft to RCA  . Coronary artery disease    Left main disease and severe 3-vessel CAD  . Diastolic heart failure (HCC)   . Embolism involving retinal artery   . GERD (gastroesophageal reflux disease)   . Hard of hearing   . Hypertension   . Hypothyroidism   . Iliac artery aneurysm (HCC)    Dr. Edilia Bo  . PAF (paroxysmal atrial fibrillation) (HCC)   . 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 12/2012: mm Stephannie Peters XT transcatheter heart valve placed via transapical approach  . Stroke [facility])    mini stroke effective his eye left    Patient Active Problem List   Diagnosis Date Noted  . NSTEMI (non-ST elevated myocardial infarction) (HCC) 02/12/2017  . Sepsis due to urinary tract infection (HCC) 10/02/2015  . Hard of hearing   . Pressure ulcer 01/28/2015  .  Chronic cholecystitis 01/27/2015  . Chronic combined systolic and diastolic congestive heart failure (HCC) 01/27/2015  . Hyponatremia 11/21/2014  . Elevated LFTs 11/14/2014  . Anemia 05/23/2014  . Microscopic hematuria 05/23/2014  . Thrombocytopenia (HCC) 02/21/2014  . Malnutrition of moderate degree (HCC) 02/21/2014  . Occlusion and stenosis of carotid artery without mention of cerebral infarction 09/04/2013  . Aftercare following surgery of the circulatory system, NEC 09/04/2013  . AAA (abdominal aortic aneurysm) without rupture (HCC) 03/06/2013  . 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 Coronary artery disease involving coronary bypass graft of native heart with angina pectoris (HCC)   . Hypertensive heart disease with CHF (HCC)   . GERD (gastroesophageal reflux disease)   . Thyroid disease   . Embolism involving retinal artery   . BPH (benign prostatic hypertrophy)   . Iliac artery aneurysm (HCC)   . Rheumatoid arthritis (HCC) 04/22/2010  . PULMONARY FUNCTION TESTS, ABNORMAL 04/22/2010    Past Surgical History:  Procedure Laterality Date  . BIOPSY PROSTATE  2005  . BUNIONECTOMY WITH HAMMERTOE RECONSTRUCTION Bilateral   . CARDIAC CATHETERIZATION    . CARDIAC CATHETERIZATION N/A 05/07/2015   Procedure: Right Heart Cath and Coronary/Graft Angiography;  Surgeon: Lyn Records, MD;  Location: [facility] INVASIVE CV LAB;  Service: Cardiovascular;  Laterality: N/A;  . CAROTID ENDARTERECTOMY Right Jan. 2, 1997  . CATARACT EXTRACTION W/ INTRAOCULAR LENS  IMPLANT Right   . CHOLECYSTECTOMY N/A 01/28/2015   Procedure: LAPAROSCOPIC CHOLECYSTECTOMY WITH INTRAOPERATIVE CHOLANGIOGRAM;  Surgeon: Gaynelle Adu, MD;  Location: [facility] OR;  Service: General;  Laterality: N/A;  . COLONOSCOPY    . CORONARY ARTERY BYPASS GRAFT  01/1998   Dr. Sheliah Plane; LIMA to LAD, SVG to D1, SVG to LCx, SVG to RCA, open saphenous vein harvest via right lower extremity  . HARVEST BONE GRAFT  1982   FOR  THE  ANKLE  . HEMIARTHROPLASTY HIP Left    AFTER HIP FX  . INTRAOPERATIVE TRANSESOPHAGEAL ECHOCARDIOGRAM N/A 01/15/2013   Procedure: INTRAOPERATIVE TRANSESOPHAGEAL ECHOCARDIOGRAM;  Surgeon: [name], MD;  Location: [facility] OR;  Service: Open Heart Surgery;  Laterality: N/A;  . JOINT REPLACEMENT    . LEFT AND RIGHT HEART CATHETERIZATION WITH CORONARY ANGIOGRAM N/A 12/20/2012   Procedure: LEFT AND RIGHT HEART CATHETERIZATION WITH CORONARY ANGIOGRAM;  Surgeon: Lesleigh Noe, MD;  Location: [facility] CATH LAB;  Service: Cardiovascular;  Laterality: N/A;  . LEFT HEART CATH AND CORS/GRAFTS ANGIOGRAPHY N/A 02/14/2017   Procedure: LEFT HEART CATH AND CORS/GRAFTS ANGIOGRAPHY;  Surgeon: Marykay Lex, MD;  Location: [facility] INVASIVE CV LAB;  Service: Cardiovascular;  Laterality: N/A;  . SKIN GRAFTS  1968  . TOTAL HIP ARTHROPLASTY Left 2007  . TOTAL KNEE ARTHROPLASTY  1992-2002   right-left  . TRANSCATHETER AORTIC VALVE REPLACEMENT, TRANSAPICAL N/A 01/15/2013   Procedure: TRANSCATHETER AORTIC VALVE REPLACEMENT, TRANSAPICAL;  Surgeon: [name], MD;  Location: [facility] OR;  Service: Open Heart Surgery;  Laterality: N/A;  . TRANSURETHRAL RESECTION OF PROSTATE  2012        Home Medications    Prior to Admission medications   Medication Sig Start Date End Date Taking? Authorizing Provider  acetaminophen (TYLENOL) 325 MG tablet Take 2 tablets (650 mg total) by mouth every 6 (six) hours as needed for mild pain, moderate pain, fever or headache. 02/03/15   Hongalgi, Maximino Greenland, MD  amLODipine (NORVASC) 5 MG tablet Take 5 mg by mouth daily.    [provider]  aspirin EC 81 MG EC tablet Take 1 tablet (81 mg total) by mouth daily. 01/21/13   Ardelle Balls, PA-C  atorvastatin (LIPITOR) 20 MG tablet Take 20 mg by mouth daily.    [provider]  carvedilol (COREG) 6.25 MG tablet Take 1 tablet (6.25 mg total) by mouth 2 (two) times daily with a meal. 10/21/15   Lyn Records, MD  clopidogrel  (PLAVIX) 75 MG tablet Take 1 tablet (75 mg total) by mouth daily. 04/19/17   Leone Brand, NP  fluticasone (FLONASE) 50 MCG/ACT nasal spray Place 1 spray into both nostrils daily.  06/29/16   [provider]  folic acid (FOLVITE) 1 MG tablet Take 3 mg by mouth daily.    [provider]  furosemide (LASIX) 20 MG tablet Take 40 mg by mouth daily.    [provider]  lactose free nutrition (BOOST) LIQD Take 237 mLs daily by mouth.    [provider]  levothyroxine (SYNTHROID, LEVOTHROID) 50 MCG tablet Take 50 mcg by mouth daily. 11/06/14   [provider]  loratadine (CLARITIN) 10 MG tablet Take 10 mg by mouth daily. 06/29/16   [provider]  losartan (COZAAR) 25 MG tablet Take 1 tablet (25 mg total) by mouth daily. 04/19/17   Leone Brand, NP  MELATONIN PO Take 1 tablet by mouth daily.    [provider]  methotrexate 2.5  MG tablet Take 25 mg by mouth every Friday.     [provider]  nitroGLYCERIN (NITROSTAT) 0.4 MG SL tablet PLACE 1 TABLET (0.4 MG TOTAL) UNDER THE TONGUE EVERY 5 MINUTES AS NEEDED FOR CHEST PAIN 12/21/16   Lyn Records, MD  pantoprazole (PROTONIX) 40 MG tablet Take 1 tablet (40 mg total) by mouth daily. 02/18/17   Azalee Course, PA  predniSONE (DELTASONE) 5 MG tablet Take 5 mg by mouth daily. 04/07/15   [provider]    Family History Family History  Problem Relation Age of Onset  . Heart attack Mother   . Heart disease Mother        Before age 20  . Hyperlipidemia Mother   . Hypertension Mother   . Hypertension Son   . Heart disease Father        AAA-   . Hyperlipidemia Father   . Heart attack Father   . Hypertension Father   . Heart disease Brother        Before age 41  . Hyperlipidemia Brother   . Heart attack Brother   . Hypertension Brother     Social History Social History   Tobacco Use  . Smoking status: Former Smoker    Packs/day: 0.50    Years: 47.00    Pack years:  23.50    Types: Cigarettes    Last attempt to quit: 03/28/1958    Years since quitting: 59.3  . Smokeless tobacco: Former Neurosurgeon    Types: Chew  . Tobacco comment: ONLY USED 2 YEARS  Substance Use Topics  . Alcohol use: No  . Drug use: No     Allergies   Ciprofloxacin   Review of Systems Review of Systems  Unable to perform ROS: Mental status change     Physical Exam Updated Vital Signs BP (!) 99/43   Pulse 78   Temp (!) 104.1 F (40.1 C) (Rectal)   Resp 15   Ht 5\' 7"  (1.702 m)   Wt 68 kg (150 lb)   SpO2 99%   BMI 23.49 kg/m   Physical Exam  Nursing note and vitals reviewed.  84 year old male, resting comfortably and in no acute distress. Vital signs are significant for fever. Oxygen saturation is 88% on room air, which is hypoxic.  Oxygen saturation on 3 L nasal oxygen is 99%, which is normal. Head is normocephalic and atraumatic. PERRLA, EOMI. Oropharynx is clear. Neck is nontender and supple without adenopathy or JVD. Back is nontender and there is no CVA tenderness. Lungs are clear without rales, wheezes, or rhonchi. Chest is nontender. Heart has regular rate and rhythm without murmur. Abdomen is soft, flat, nontender without masses or hepatosplenomegaly and peristalsis is normoactive.  Right upper quadrant ventral hernia present which is easily reducible. Extremities have no cyanosis or edema, full range of motion is present.  Deformity of hands from long-standing rheumatoid arthritis. Skin is warm and dry without rash. Neurologic: Awake, alert, oriented to person and place but not time, cranial nerves are intact, there are no motor or sensory deficits.  When asked why he is here, he just repeats one syllable over and over again.  ED Treatments / Results  Labs (all labs ordered are listed, but only abnormal results are displayed) Labs Reviewed  COMPREHENSIVE METABOLIC PANEL - Abnormal; Notable for the following components:      Result Value   Sodium 134 (*)      Chloride 97 (*)  Glucose, Bld 137 (*)    BUN 33 (*)    Creatinine, Ser 1.38 (*)    Calcium 8.6 (*)    Albumin 3.3 (*)    Alkaline Phosphatase 170 (*)    GFR calc non Af Amer 44 (*)    GFR calc Af Amer 51 (*)    All other components within normal limits  CBC WITH DIFFERENTIAL/PLATELET - Abnormal; Notable for the following components:   RBC 4.21 (*)    Hemoglobin 11.1 (*)    HCT 36.1 (*)    RDW 17.6 (*)    Platelets 124 (*)    All other components within normal limits  URINALYSIS, ROUTINE W REFLEX MICROSCOPIC - Abnormal; Notable for the following components:   APPearance HAZY (*)    Hgb urine dipstick MODERATE (*)    Protein, ur 100 (*)    All other components within normal limits  CULTURE, BLOOD (ROUTINE X 2)  CULTURE, BLOOD (ROUTINE X 2)  URINE CULTURE  I-STAT CG4 LACTIC ACID, ED  I-STAT CG4 LACTIC ACID, ED  I-STAT CG4 LACTIC ACID, ED    EKG EKG Interpretation  Date/Time:  Thursday Aug 03 2017 02:56:40 EDT Ventricular Rate:  88 PR Interval:    QRS Duration: 185 QT Interval:  405 QTC Calculation: 490 R Axis:   -45 Text Interpretation:  Sinus  rhythm Ventricular premature complex Prolonged PR interval Left bundle branch block When compared with ECG of 02/14/2017, No significant change was found Confirmed by Dione Booze ([id number]) on 08/03/2017 3:05:39 AM   Radiology Dg Chest Port 1 View  Result Date: 08/03/2017 CLINICAL DATA:  Altered mental status EXAM: PORTABLE CHEST 1 VIEW COMPARISON:  02/22/2017 FINDINGS: Stable cardiomegaly with tortuous atherosclerotic aorta. Interstitial pulmonary edema is noted. The patient is status post CABG with aortic valvular replacement. Colonic interposition over the liver shadow is identified. No acute pulmonary consolidation. There is minimal bibasilar atelectasis. No acute osseous abnormality. Degenerative change noted about both shoulders. IMPRESSION: Stable cardiomegaly with post CABG change. Tortuous atherosclerotic nonaneurysmal  aorta. Interstitial edema is noted since prior. Electronically Signed   By: Tollie Eth M.D.   On: 08/03/2017 03:25    Procedures Procedures  CRITICAL CARE Performed by: Dione Booze Total critical care time: 35 minutes Critical care time was exclusive of separately billable procedures and treating other patients. Critical care was necessary to treat or prevent imminent or life-threatening deterioration. Critical care was time spent personally by me on the following activities: development of treatment plan with patient and/or surrogate as well as nursing, discussions with consultants, evaluation of patient's response to treatment, examination of patient, obtaining history from patient or surrogate, ordering and performing treatments and interventions, ordering and review of laboratory studies, ordering and review of radiographic studies, pulse oximetry and re-evaluation of patient's condition.  Medications Ordered in ED Medications  piperacillin-tazobactam (ZOSYN) IVPB 3.375 g (has no administration in time range)  vancomycin (VANCOCIN) IVPB 1000 mg/200 mL premix (has no administration in time range)     Initial Impression / Assessment and Plan / ED Course  I have reviewed the triage vital signs and the nursing notes.  Pertinent labs & imaging results that were available during my care of the patient were reviewed by me and considered in my medical decision making (see chart for details).  Fever with altered mental status.  Because of altered sensorium, a code sepsis is initiated and he is started on antibiotics for sepsis of undetermined source.  Most likely source is urinary  tract.  However, with hypoxia, need to consider possibility of pneumonia.  Chest x-ray is pending as is urinalysis.  Old records are reviewed, and he has several hospitalizations for urinary tract infection and sepsis.  Lactic acid level is normal.  Chest x-ray shows no evidence of pneumonia.  Urinalysis shows no  evidence of urinary tract infection.  Case is discussed with Dr. Toniann Fail of [facility], who agrees to come evaluate the patient for admission.  Final Clinical Impressions(s) / ED Diagnoses   Final diagnoses:  Fever, unspecified fever cause  Altered mental status, unspecified altered mental status type  Acute kidney injury (nontraumatic) (HCC)  Normochromic normocytic anemia  Thrombocytopenia [facility])    ED Discharge Orders    None       Dione Booze, MD 08/03/17 [id number]

## 2017-08-03 NOTE — Progress Notes (Signed)
  Patient seen and evaluated, chart reviewed, please see EMR for updated orders. Please see full H&P dictated by admitting physician Dr Buck Mam for same date of service.   Patient is a very very poor historian, remains confused and disoriented  84 y.o. male with a hx of CABG 1999, NSTEMI 01/2017 w/ cath results below, TAVR 2014, AAA, CKD, S-D-DHF, RA, BPH, GERD, HOH, PAF, CVA, LBBB, anemia, thrombocytopenia who was admitted 08/03/17 with fever and confusion, and subsequently found to have elevated troponin  1)Elevated Troponin-due to very poor historian, please see left heart catheterization report from 02/14/2017, patient has severe multivessel obstructive CAD not amenable to revascularization.  Cardiology consult requested and appreciated, IV heparin as per cardiology team, continue aspirin, Lipitor and Plavix  2)SIRS-patient was admitted with fever and confusion, continue IV Vanco and Zosyn pending blood and urine cultures from 08/03/2017  3)Hypotension-most likely secondary to #1 #2 above, improved after IV fluids and steroids, continue to hold amlodipine, Coreg and Lasix as well as losartan due to low BP  Patient seen and evaluated, chart reviewed, please see EMR for updated orders. Please see full H&P dictated by admitting physician Dr Buck Mam for same date of service.   Shon Hale, MD

## 2017-08-03 NOTE — Progress Notes (Signed)
Pharmacy Antibiotic Note  Hunter Choi is a 84 y.o. male admitted on 08/03/2017 with sepsis.  Pharmacy has been consulted for Vancomycin/Zosyn dosing. WBC WNL. Mild bump in SCr. CXR with on infectious process.   Plan: Vancomycin 1000 mg IV q24h Zosyn 3.375G IV q8h to be infused over 4 hours Trend WBC, temp, renal function  F/U infectious work-up Drug levels as indicated   Height: 5\' 7"  (170.2 cm) Weight: 150 lb (68 kg) IBW/kg (Calculated) : 66.1  Temp (24hrs), Avg:104.1 F (40.1 C), Min:104.1 F (40.1 C), Max:104.1 F (40.1 C)  Recent Labs  Lab 08/03/17 0242 08/03/17 0245 08/03/17 0259  WBC 9.1  --   --   CREATININE  --  1.38*  --   LATICACIDVEN  --   --  1.03    Estimated Creatinine Clearance: 35.3 mL/min (A) (by C-G formula based on SCr of 1.38 mg/dL (H)).    Allergies  Allergen Reactions  . Ciprofloxacin Other (See Comments)    REACTION: mild delirium   10/03/17 08/03/2017 3:54 AM

## 2017-08-03 NOTE — ED Notes (Signed)
Pt very lethargic. Unable to stay awake for more than a few seconds. Attempted to see if patient could safely take a sip of water, however patient immediately falls back asleep. Pt not able to tolerate PO pills or fluids at this time.

## 2017-08-04 ENCOUNTER — Inpatient Hospital Stay (HOSPITAL_COMMUNITY): Payer: Medicare Other

## 2017-08-04 DIAGNOSIS — I340 Nonrheumatic mitral (valve) insufficiency: Secondary | ICD-10-CM

## 2017-08-04 DIAGNOSIS — I5043 Acute on chronic combined systolic (congestive) and diastolic (congestive) heart failure: Secondary | ICD-10-CM

## 2017-08-04 LAB — CBC
HEMATOCRIT: 34 % — AB (ref 39.0–52.0)
HEMOGLOBIN: 10.3 g/dL — AB (ref 13.0–17.0)
MCH: 26.2 pg (ref 26.0–34.0)
MCHC: 30.3 g/dL (ref 30.0–36.0)
MCV: 86.5 fL (ref 78.0–100.0)
Platelets: 120 10*3/uL — ABNORMAL LOW (ref 150–400)
RBC: 3.93 MIL/uL — AB (ref 4.22–5.81)
RDW: 17.6 % — AB (ref 11.5–15.5)
WBC: 8.3 10*3/uL (ref 4.0–10.5)

## 2017-08-04 LAB — BASIC METABOLIC PANEL
ANION GAP: 9 (ref 5–15)
BUN: 33 mg/dL — ABNORMAL HIGH (ref 6–20)
CHLORIDE: 104 mmol/L (ref 101–111)
CO2: 24 mmol/L (ref 22–32)
Calcium: 7.9 mg/dL — ABNORMAL LOW (ref 8.9–10.3)
Creatinine, Ser: 1.31 mg/dL — ABNORMAL HIGH (ref 0.61–1.24)
GFR, EST AFRICAN AMERICAN: 55 mL/min — AB (ref >60)
GFR, EST NON AFRICAN AMERICAN: 47 mL/min — AB (ref >60)
Glucose, Bld: 137 mg/dL — ABNORMAL HIGH (ref 65–99)
POTASSIUM: 4.4 mmol/L (ref 3.5–5.1)
SODIUM: 137 mmol/L (ref 135–145)

## 2017-08-04 LAB — SEDIMENTATION RATE: Sed Rate: 14 mm/hr (ref 0–16)

## 2017-08-04 LAB — GLUCOSE, CAPILLARY
GLUCOSE-CAPILLARY: 125 mg/dL — AB (ref 65–99)
Glucose-Capillary: 126 mg/dL — ABNORMAL HIGH (ref 65–99)

## 2017-08-04 LAB — C-REACTIVE PROTEIN: CRP: 10.3 mg/dL — ABNORMAL HIGH (ref <1.0)

## 2017-08-04 LAB — URINE CULTURE: Culture: NO GROWTH

## 2017-08-04 LAB — ECHOCARDIOGRAM COMPLETE
Height: 67 in
Weight: 2416.24 oz

## 2017-08-04 LAB — TSH: TSH: 1.603 u[IU]/mL (ref 0.350–4.500)

## 2017-08-04 LAB — HEPARIN LEVEL (UNFRACTIONATED)
HEPARIN UNFRACTIONATED: 0.56 [IU]/mL (ref 0.30–0.70)
Heparin Unfractionated: 0.61 IU/mL (ref 0.30–0.70)

## 2017-08-04 MED ORDER — HYDROCORTISONE NA SUCCINATE PF 100 MG IJ SOLR
50.0000 mg | Freq: Two times a day (BID) | INTRAMUSCULAR | Status: AC
  Administered 2017-08-05 (×2): 50 mg via INTRAVENOUS
  Filled 2017-08-04 (×2): qty 2

## 2017-08-04 MED ORDER — HYDROCORTISONE NA SUCCINATE PF 100 MG IJ SOLR
50.0000 mg | Freq: Four times a day (QID) | INTRAMUSCULAR | Status: AC
  Administered 2017-08-04: 50 mg via INTRAVENOUS
  Filled 2017-08-04: qty 2

## 2017-08-04 MED ORDER — PREDNISONE 5 MG PO TABS
5.0000 mg | ORAL_TABLET | Freq: Every day | ORAL | Status: DC
  Administered 2017-08-07: 5 mg via ORAL
  Filled 2017-08-04: qty 1

## 2017-08-04 MED ORDER — FUROSEMIDE 10 MG/ML IJ SOLN
40.0000 mg | Freq: Two times a day (BID) | INTRAMUSCULAR | Status: AC
  Administered 2017-08-04 – 2017-08-05 (×3): 40 mg via INTRAVENOUS
  Filled 2017-08-04 (×3): qty 4

## 2017-08-04 MED ORDER — HYDROCORTISONE NA SUCCINATE PF 100 MG IJ SOLR
50.0000 mg | Freq: Every day | INTRAMUSCULAR | Status: AC
  Administered 2017-08-06: 50 mg via INTRAVENOUS
  Filled 2017-08-04: qty 2

## 2017-08-04 NOTE — Progress Notes (Signed)
  Echocardiogram 2D Echocardiogram has been performed.  Tye Savoy 08/04/2017, 12:58 PM

## 2017-08-04 NOTE — Progress Notes (Signed)
PROGRESS NOTE    Hunter Choi  YOM:[id number] DOB: 02/21/1928 DOA: 08/03/2017 PCP: Leeroy Cha, MD   Brief Narrative:  Hunter Choi is Hunter Choi 84 y.o. male with history of CAD status post CABG, status post TAVR, abdominal aortic aneurysm, chronic kidney disease, chronic systolic heart failure, rheumatoid arthritis, hypothyroidism, chronic anemia and thrombocytopenia was brought to the ER after patient was found to have fever of 103 F at home with confusion.  As per the patient's son patient has been having cough with productive sputum last 2 days with upper respiratory tract symptoms.  Patient also complained of some epigastric discomfort.  Denies any chest pain.  Over the last 2 days patient has been having increasing fever and since patient became more confused patient was brought to the ER.  Assessment & Plan:   Principal Problem:   SIRS (systemic inflammatory response syndrome) (HCC) Active Problems:   Rheumatoid arthritis (HCC)   S/P TAVR (transcatheter aortic valve replacement)   Chronic combined systolic and diastolic congestive heart failure (HCC)   CAD (coronary artery disease)   Essential hypertension   Acute encephalopathy   Acute on chronic combined systolic and diastolic CHF (congestive heart failure) ([place])  1. Fever without Coron [place] source  Systemic Inflammatory Response Syndrome - Afebrile since initial fever on presentation.  CT chest abdomen pelvis without identified cause.  Head CT without identified cause.  Sons had Desa Rech bit of Diannie Willner cough and he's had cough recently as well.  His confusion seems to be improved.  No meningismus.   1. Negative flu.  Follow RVP. 2. Follow blood cx.  Negative urine cx. 3. Negative MRSA PCR 4. Will d/c vanc, continue zosyn for now 5. ESR, CRP, follow procalcitonin   2. Elevated Troponin:  Suspect demand ischemia in setting of above.  On heparin gtt.  Cardiology following.  Cath 01/2017 and at that time, thought not candidate for PCI.   No  plans for cath at this point per cards. 1. ASA, plavix, statin 2. Holding beta blocker for now with diuresis  3. History of rheumatoid arthritis will hold methotrexate. 1. Started on hydrocortisone, will taper this gradually and resume home prednisone as long as he continues to improve  4. History of TAVR - on ASA daily . 5. History of CAD status post CABG.   Appreciate cardiology recommendations. 1. ASA, plavix, statin 2. Holding beta blocker for now.   6. Acute encephalopathy likely from fever  Possible Dementia: Hunter Choi&Ox2 with me.  On discussion with his son, he notes it's normal for him not to know the date.  Patient is extremely hard of hearing, which complicates this as well.  Continue to monitor. 1. Delirium precautions  2. CT head without notable cause  7. Chronic combined systolic and diastolic heart failure last EF measured in November 2018 was 35 to 40%.  Repeat echo with EF 30-35%. 1. Lasix restarted by cardiology 2. Holding cozaar     8. History of hypertension - lasix as above.  Holding cozaar and metop and amlodipine.   9. AKI on Chronic kidney disease stage III.  creatinine peaked to 1.46, improving now.  Follow with diuresis.  10. Chronic anemia and Thrombocytopenia -  follow CBC with differential.  11. History of hypothyroidism on Synthroid.  12. Hyperlipidemia on statins.  13. Elevated Alk Phos: mild, follow  14. Aorto Bililiac Aneurysms: needs 3-6 month repeat imaging and vascular follow up  DVT prophylaxis: heparin gtt Code Status: full code Family Communication: son  over phone Disposition Plan: pending improvement   Consultants:   cardiology  Procedures:  5/10 echo Study Conclusions  - Left ventricle: The cavity size was normal. Wall thickness was   increased in Marcile [place] pattern of moderate LVH. Systolic function was   moderately to severely reduced. The estimated ejection fraction   was in the range of 30% to 35%. Diffuse hypokinesis with  regional   variation. The study is not technically sufficient to allow   evaluation of LV diastolic function. - Aortic valve: s/p TAVR. No obstruction. Trivial perivalvular   leak. Mean gradient (S): 6 mm Hg. Peak gradient (S): 13 mm Hg.   Valve area (VTI): 1.7 cm^2. Valve area (Vmax): 1.56 cm^2. Valve   area (Vmean): 1.54 cm^2. - Mitral valve: Mildly thickened leaflets . There was moderate   regurgitation. - Left atrium: Severely dilated. - Right ventricle: The cavity size was mildly dilated. Mildly   reduced systolic function. - Right atrium: The atrium was mildly dilated.  Impressions:  - Compared to Hunter Choi prior study in 2018, the LVEF is lower at 30-35%.  Antimicrobials:  Anti-infectives (From admission, onward)   Start     Dose/Rate Route Frequency Ordered Stop   08/04/17 0300  vancomycin (VANCOCIN) IVPB 1000 mg/200 mL premix  Status:  Discontinued     1,000 mg 200 mL/hr over 60 Minutes Intravenous Every 24 hours 08/03/17 0356 08/04/17 1535   08/03/17 1000  piperacillin-tazobactam (ZOSYN) IVPB 3.375 g     3.375 g 12.5 mL/hr over 240 Minutes Intravenous Every 8 hours 08/03/17 0356     08/03/17 0245  piperacillin-tazobactam (ZOSYN) IVPB 3.375 g     3.375 g 100 mL/hr over 30 Minutes Intravenous  Once 08/03/17 0242 08/03/17 0334   08/03/17 0245  vancomycin (VANCOCIN) IVPB 1000 mg/200 mL premix     1,000 mg 200 mL/hr over 60 Minutes Intravenous  Once 08/03/17 0242 08/03/17 0414     Subjective: Hard of hearing. Denies pain. Denies complaint. Hunter Choi&Ox2.  Didn't know month.  Objective: Vitals:   08/04/17 0300 08/04/17 0336 08/04/17 0849 08/04/17 1232  BP:  (!) 117/59 127/63 (!) 136/58  Pulse: (!) 51 (!) 53 (!) 57 (!) 57  Resp: 13 13 12 13   Temp:  (!) 97.5 F (36.4 C) (!) 97.5 F (36.4 C) (!) 97.5 F (36.4 C)  TempSrc:  Oral Oral Oral  SpO2: 100% 100% 100% 100%  Weight:      Height:        Intake/Output Summary (Last 24 hours) at 08/04/2017 1500 Last data filed at  08/04/2017 0600 Gross per 24 hour  Intake 1036 ml  Output 550 ml  Net 486 ml   Filed Weights   08/03/17 0255 08/03/17 1851 08/04/17 0250  Weight: 68 kg (150 lb) 68.6 kg (151 lb 3.8 oz) 68.5 kg (151 lb 0.2 oz)    Examination:  General exam: Appears calm and comfortable  Respiratory system: Clear to auscultation. Respiratory effort normal. Cardiovascular system: S1 & S2 heard, RRR. No JVD, murmurs, rubs, gallops or clicks. No pedal edema. Gastrointestinal system: Abdomen is nondistended, soft and nontender. No organomegaly or masses felt. Normal bowel sounds heard. Central nervous system: Alert and oriented. No focal neurological deficits.  Hard of hearing.  Extremities: Symmetric 5 x 5 power. Skin: No rashes, lesions or ulcers Psychiatry: Judgement and insight appear normal. Mood & affect appropriate.     Data Reviewed: I have personally reviewed following labs and imaging studies  CBC: Recent Labs  Lab  08/03/17 0242 08/03/17 0623 08/04/17 0233  WBC 9.1 9.8 8.3  NEUTROABS 5.8 5.5  --   HGB 11.1* 10.1* 10.3*  HCT 36.1* 32.8* 34.0*  MCV 85.7 86.3 86.5  PLT 124* 99* 768*   Basic Metabolic Panel: Recent Labs  Lab 08/03/17 0245 08/03/17 0623 08/04/17 0233  NA 134* 134* 137  K 4.4 4.2 4.4  CL 97* 100* 104  CO2 27 25 24   GLUCOSE 137* 100* 137*  BUN 33* 34* 33*  CREATININE 1.38* 1.46* 1.31*  CALCIUM 8.6* 8.0* 7.9*   GFR: Estimated Creatinine Clearance: 37.1 mL/min (Kemi Gell) (by C-G formula based on SCr of 1.31 mg/dL (H)). Liver Function Tests: Recent Labs  Lab 08/03/17 0245 08/03/17 0623  AST 35 32  ALT 43 35  ALKPHOS 170* 136*  BILITOT 0.8 0.9  PROT 6.5 5.9*  ALBUMIN 3.3* 2.9*   No results for input(s): LIPASE, AMYLASE in the last 168 hours. No results for input(s): AMMONIA in the last 168 hours. Coagulation Profile: Recent Labs  Lab 08/03/17 0623  INR 1.34   Cardiac Enzymes: Recent Labs  Lab 08/03/17 0623 08/03/17 1317 08/03/17 2010  TROPONINI 1.23*  0.92* 0.39*   BNP (last 3 results) No results for input(s): PROBNP in the last 8760 hours. HbA1C: No results for input(s): HGBA1C in the last 72 hours. CBG: Recent Labs  Lab 08/03/17 0755 08/03/17 1710 08/03/17 2332 08/04/17 0835  GLUCAP 108* 142* 149* 125*   Lipid Profile: No results for input(s): CHOL, HDL, LDLCALC, TRIG, CHOLHDL, LDLDIRECT in the last 72 hours. Thyroid Function Tests: Recent Labs    08/04/17 0233  TSH 1.603   Anemia Panel: No results for input(s): VITAMINB12, FOLATE, FERRITIN, TIBC, IRON, RETICCTPCT in the last 72 hours. Sepsis Labs: Recent Labs  Lab 08/03/17 0259 08/03/17 0623 08/03/17 2010  PROCALCITON  --  1.61  --   LATICACIDVEN 1.03 1.0 1.8    Recent Results (from the past 240 hour(s))  Blood Culture (routine x 2)     Status: None (Preliminary result)   Collection Time: 08/03/17  2:45 AM  Result Value Ref Range Status   Specimen Description BLOOD RIGHT FOREARM  Final   Special Requests   Final    BOTTLES DRAWN AEROBIC AND ANAEROBIC Blood Culture adequate volume   Culture   Final    NO GROWTH 1 DAY Performed at [facility], [place] [address] [place], [place] [address]    Report Status PENDING  Incomplete  Blood Culture (routine x 2)     Status: None (Preliminary result)   Collection Time: 08/03/17  2:50 AM  Result Value Ref Range Status   Specimen Description BLOOD LEFT ANTECUBITAL  Final   Special Requests   Final    BOTTLES DRAWN AEROBIC AND ANAEROBIC Blood Culture adequate volume   Culture   Final    NO GROWTH 1 DAY Performed at [facility], [place] [address] [place], [place] [address]    Report Status PENDING  Incomplete  Urine culture     Status: None   Collection Time: 08/03/17  3:52 AM  Result Value Ref Range Status   Specimen Description URINE, CATHETERIZED  Final   Special Requests NONE  Final   Culture   Final    NO GROWTH Performed at [facility], [place] [address] [place], [place] [address]    Report  Status 08/04/2017 FINAL  Final  MRSA PCR Screening     Status: None   Collection Time:  08/03/17  6:56 PM  Result Value Ref Range Status   MRSA by PCR NEGATIVE NEGATIVE Final    Comment:        The GeneXpert MRSA Assay (FDA approved for NASAL specimens only), is one component of [facility] Leard comprehensive MRSA colonization surveillance program. It is not intended to diagnose MRSA infection nor to guide or monitor treatment for MRSA infections. Performed at [facility], [place] [address], [place], [place] [address]          Radiology Studies: Ct Abdomen Pelvis Wo Contrast  Result Date: 08/03/2017 CLINICAL DATA:  Altered mental status, fever, cough, vomiting EXAM: CT CHEST, ABDOMEN AND PELVIS WITHOUT CONTRAST TECHNIQUE: Multidetector CT imaging of the chest, abdomen and pelvis was performed following the standard protocol without IV contrast. COMPARISON:  08/03/2017, 11/14/2014 FINDINGS: CT CHEST FINDINGS Cardiovascular: Extensive atherosclerosis of the thoracic aorta without significant aneurysm. No mediastinal hemorrhage or hematoma. Previous coronary bypass and TAVR noted. Heart is enlarged. No pericardial effusion. Mediastinum/Nodes: No enlarged mediastinal, hilar, or axillary lymph nodes. Thyroid gland, trachea, and esophagus demonstrate no significant findings. Lungs/Pleura: Subcentimeter peripheral and subpleural calcified granuloma in the right upper lobe, image 48 series 9. Bibasilar atelectasis noted. Respiratory motion artifact through the lower lobes. No significant focal pneumonia, collapse or consolidation. No interstitial process or edema. No other pleural abnormality, pleural effusion or pneumothorax. Trachea central airways appear patent. Musculoskeletal: Degenerative changes noted spine. No acute compression fracture. CT ABDOMEN PELVIS FINDINGS Hepatobiliary: No focal liver abnormality is seen. Status post cholecystectomy. No biliary dilatation. Pancreas: Unremarkable. No pancreatic  ductal dilatation or surrounding inflammatory changes. Spleen: Normal in size without focal abnormality. Adrenals/Urinary Tract: Normal adrenal glands. No renal obstruction, obstructive uropathy, hydronephrosis, hydroureter. Bladder wall thickening noted appearing chronic with small bladder diverticula. Stomach/Bowel: Negative for bowel obstruction, significant dilatation, ileus, free air. No fluid collection or abscess. Scattered colonic diverticulosis. No acute inflammatory process. Vascular/Lymphatic: Aorto bi-iliac aneurysms again noted without evidence of acute rupture or retroperitoneal hemorrhage. Distal aortic aneurysm measures 5.3 cm in maximal diameter, image 85 series 7. Right common iliac aneurysm measures 5.9 cm and left common iliac aneurysm measures 4 cm. Right internal iliac aneurysm measures 4.8 cm. These all have enlarged compared 2016. Right common femoral aneurysm measures 2.1 cm. Reproductive: No significant finding by CT. Other: No abdominal wall hernia or abnormality. No abdominopelvic ascites. Musculoskeletal: Left hip arthroplasty creates artifact. Degenerative changes noted spine. No acute compression fracture IMPRESSION: Thoracic aortic atherosclerosis without aneurysm. Cardiomegaly with bibasilar atelectasis. No acute intrathoracic finding. Enlargement of the aorto bi-iliac aneurysms compared 2016 without evidence of acute rupture or retroperitoneal hemorrhage. Recommend followup by abdomen and pelvis CTA in 3-6 months, and vascular surgery referral/consultation if not already obtained. This recommendation follows ACR consensus guidelines: White Paper of the ACR Incidental Findings Committee II on Vascular Findings. J Am Coll Radiol 2013; 10:789-794. Interval cholecystectomy No other acute intra-abdominal or pelvic finding by noncontrast CT. Colonic diverticulosis Chronic appearing bladder wall thickening with scattered diverticula Electronically Signed   By: Jerilynn Mages.  Shick M.D.   On:  08/03/2017 07:27   Ct Head Wo Contrast  Result Date: 08/03/2017 CLINICAL DATA:  Altered level of consciousness EXAM: CT HEAD WITHOUT CONTRAST TECHNIQUE: Contiguous axial images were obtained from the base of the skull through the vertex without intravenous contrast. COMPARISON:  02/12/2017 FINDINGS: Brain: No evidence of acute infarction, hemorrhage, hydrocephalus, extra-axial collection or mass lesion/mass effect. Small remote infarcts along the right more than left high cerebral convexities and bilateral  cerebellum. Chronic small vessel ischemic type gliosis in the cerebral white matter. Generalized atrophy, stable. Vascular: Atherosclerotic calcification Skull: No acute or aggressive finding Sinuses/Orbits: Chronic bilateral mastoid opacification with negative nasopharynx. Chronic low calcifications and right cataract resection IMPRESSION: 1. No acute finding or change from 2018. 2. Chronic small vessel ischemic injury. 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 Electronically Signed   By: Monte Fantasia M.D.   On: 08/03/2017 07:16   Ct Chest Wo Contrast  Result Date: 08/03/2017 CLINICAL DATA:  Altered mental status, fever, cough, vomiting EXAM: CT CHEST, ABDOMEN AND PELVIS WITHOUT CONTRAST TECHNIQUE: Multidetector CT imaging of the chest, abdomen and pelvis was performed following the standard protocol without IV contrast. COMPARISON:  08/03/2017, 11/14/2014 FINDINGS: CT CHEST FINDINGS Cardiovascular: Extensive atherosclerosis of the thoracic aorta without significant aneurysm. No mediastinal hemorrhage or hematoma. Previous coronary bypass and TAVR noted. Heart is enlarged. No pericardial effusion. Mediastinum/Nodes: No enlarged mediastinal, hilar, or axillary lymph nodes. Thyroid gland, trachea, and esophagus demonstrate no significant findings. Lungs/Pleura: Subcentimeter peripheral and subpleural calcified granuloma in the right upper lobe, image 48 series 9. Bibasilar  atelectasis noted. Respiratory motion artifact through the lower lobes. No significant focal pneumonia, collapse or consolidation. No interstitial process or edema. No other pleural abnormality, pleural effusion or pneumothorax. Trachea central airways appear patent. Musculoskeletal: Degenerative changes noted spine. No acute compression fracture. CT ABDOMEN PELVIS FINDINGS Hepatobiliary: No focal liver abnormality is seen. Status post cholecystectomy. No biliary dilatation. Pancreas: Unremarkable. No pancreatic ductal dilatation or surrounding inflammatory changes. Spleen: Normal in size without focal abnormality. Adrenals/Urinary Tract: Normal adrenal glands. No renal obstruction, obstructive uropathy, hydronephrosis, hydroureter. Bladder wall thickening noted appearing chronic with small bladder diverticula. Stomach/Bowel: Negative for bowel obstruction, significant dilatation, ileus, free air. No fluid collection or abscess. Scattered colonic diverticulosis. No acute inflammatory process. Vascular/Lymphatic: Aorto bi-iliac aneurysms again noted without evidence of acute rupture or retroperitoneal hemorrhage. Distal aortic aneurysm measures 5.3 cm in maximal diameter, image 85 series 7. Right common iliac aneurysm measures 5.9 cm and left common iliac aneurysm measures 4 cm. Right internal iliac aneurysm measures 4.8 cm. These all have enlarged compared 2016. Right common femoral aneurysm measures 2.1 cm. Reproductive: No significant finding by CT. Other: No abdominal wall hernia or abnormality. No abdominopelvic ascites. Musculoskeletal: Left hip arthroplasty creates artifact. Degenerative changes noted spine. No acute compression fracture IMPRESSION: Thoracic aortic atherosclerosis without aneurysm. Cardiomegaly with bibasilar atelectasis. No acute intrathoracic finding. Enlargement of the aorto bi-iliac aneurysms compared 2016 without evidence of acute rupture or retroperitoneal hemorrhage. Recommend followup  by abdomen and pelvis CTA in 3-6 months, and vascular surgery referral/consultation if not already obtained. This recommendation follows ACR consensus guidelines: White Paper of the ACR Incidental Findings Committee II on Vascular Findings. J Am Coll Radiol 2013; 10:789-794. Interval cholecystectomy No other acute intra-abdominal or pelvic finding by noncontrast CT. Colonic diverticulosis Chronic appearing bladder wall thickening with scattered diverticula Electronically Signed   By: Jerilynn Mages.  Shick M.D.   On: 08/03/2017 07:27   Dg Chest Port 1 View  Result Date: 08/03/2017 CLINICAL DATA:  Altered mental status EXAM: PORTABLE CHEST 1 VIEW COMPARISON:  02/22/2017 FINDINGS: Stable cardiomegaly with tortuous atherosclerotic aorta. Interstitial pulmonary edema is noted. The patient is status post CABG with aortic valvular replacement. Colonic interposition over the liver shadow is identified. No acute pulmonary consolidation. There is minimal bibasilar atelectasis. No acute osseous abnormality. Degenerative change noted about both shoulders. IMPRESSION: Stable cardiomegaly with post CABG change. Tortuous  atherosclerotic nonaneurysmal aorta. Interstitial edema is noted since prior. Electronically Signed   By: Ashley Royalty M.D.   On: 08/03/2017 03:25        Scheduled Meds: . aspirin EC  81 mg Oral Daily  . atorvastatin  20 mg Oral Daily  . clopidogrel  75 mg Oral Daily  . fluticasone  1 spray Each Nare Daily  . folic acid  3 mg Oral Daily  . furosemide  40 mg Intravenous BID  . hydrocortisone sod succinate (SOLU-CORTEF) inj  50 mg Intravenous Q6H  . lactose free nutrition  237 mL Oral Q24H  . levothyroxine  50 mcg Oral QAC breakfast  . pantoprazole  40 mg Oral Daily   Continuous Infusions: . heparin 800 Units/hr (08/03/17 1640)  . piperacillin-tazobactam (ZOSYN)  IV Stopped (08/04/17 0912)  . vancomycin Stopped (08/04/17 0434)     LOS: 1 day    Time spent: over 30 min    Fayrene Helper,  MD [facility] Pager [phone]  If 7PM-7AM, please contact night-coverage [email or web address] Password [email or web address] 08/04/2017, 3:00 PM

## 2017-08-04 NOTE — Progress Notes (Signed)
Progress Note  Patient Name: Hunter Choi Date of Encounter: 08/04/2017  Primary Cardiologist: Lesleigh Noe, MD   Subjective   Out of bed and in chair. On supplemental O2. No acute distress. He is alert today and responding to questions, but still seems confused. ? His true baseline. He does know his birthday. When asked, he responded "November 29, 1927". This is correct.   Inpatient Medications    Scheduled Meds: . aspirin EC  81 mg Oral Daily  . atorvastatin  20 mg Oral Daily  . clopidogrel  75 mg Oral Daily  . fluticasone  1 spray Each Nare Daily  . folic acid  3 mg Oral Daily  . hydrocortisone sod succinate (SOLU-CORTEF) inj  50 mg Intravenous Q6H  . lactose free nutrition  237 mL Oral Q24H  . levothyroxine  50 mcg Oral QAC breakfast  . pantoprazole  40 mg Oral Daily   Continuous Infusions: . heparin 800 Units/hr (08/03/17 1640)  . piperacillin-tazobactam (ZOSYN)  IV Stopped (08/04/17 0912)  . vancomycin Stopped (08/04/17 0434)   PRN Meds: acetaminophen **OR** acetaminophen, nitroGLYCERIN, ondansetron **OR** ondansetron (ZOFRAN) IV   Vital Signs    Vitals:   08/04/17 0250 08/04/17 0300 08/04/17 0336 08/04/17 0849  BP:   (!) 117/59 127/63  Pulse:  (!) 51 (!) 53 (!) 57  Resp:  13 13 12   Temp:   (!) 97.5 F (36.4 C) (!) 97.5 F (36.4 C)  TempSrc:   Oral Oral  SpO2:  100% 100% 100%  Weight: 151 lb 0.2 oz (68.5 kg)     Height:        Intake/Output Summary (Last 24 hours) at 08/04/2017 1033 Last data filed at 08/04/2017 0600 Gross per 24 hour  Intake 1036 ml  Output 550 ml  Net 486 ml   Filed Weights   08/03/17 0255 08/03/17 1851 08/04/17 0250  Weight: 150 lb (68 kg) 151 lb 3.8 oz (68.6 kg) 151 lb 0.2 oz (68.5 kg)    Telemetry    NSR/ sinus brady with PVCs - Personally Reviewed  ECG    5/9/19Sinus rhythm w/ Prolonged PR interval, Nonspecific IVCD with LAD  - Personally Reviewed  Physical Exam   GEN: No acute distress. Elderly and a bit  confused Neck: No JVD Cardiac: RRR, no murmurs, rubs, or gallops.  Respiratory: bibasilar crackles. GI: Soft, nontender, non-distended  MS: No edema; No deformity. Neuro:  Nonfocal  Psych: confused   Labs    Chemistry Recent Labs  Lab 08/03/17 0245 08/03/17 0623 08/04/17 0233  NA 134* 134* 137  K 4.4 4.2 4.4  CL 97* 100* 104  CO2 27 25 24   GLUCOSE 137* 100* 137*  BUN 33* 34* 33*  CREATININE 1.38* 1.46* 1.31*  CALCIUM 8.6* 8.0* 7.9*  PROT 6.5 5.9*  --   ALBUMIN 3.3* 2.9*  --   AST 35 32  --   ALT 43 35  --   ALKPHOS 170* 136*  --   BILITOT 0.8 0.9  --   GFRNONAA 44* 41* 47*  GFRAA 51* 48* 55*  ANIONGAP 10 9 9      Hematology Recent Labs  Lab 08/03/17 0242 08/03/17 0623 08/04/17 0233  WBC 9.1 9.8 8.3  RBC 4.21* 3.80* 3.93*  HGB 11.1* 10.1* 10.3*  HCT 36.1* 32.8* 34.0*  MCV 85.7 86.3 86.5  MCH 26.4 26.6 26.2  MCHC 30.7 30.8 30.3  RDW 17.6* 18.0* 17.6*  PLT 124* 99* 120*    Cardiac  Enzymes Recent Labs  Lab 08/03/17 0623 08/03/17 1317 08/03/17 2010  TROPONINI 1.23* 0.92* 0.39*   No results for input(s): TROPIPOC in the last 168 hours.   BNP Recent Labs  Lab 08/03/17 2010  BNP 2,896.1*     DDimer No results for input(s): DDIMER in the last 168 hours.   Radiology    Ct Abdomen Pelvis Wo Contrast  Result Date: 08/03/2017 CLINICAL DATA:  Altered mental status, fever, cough, vomiting EXAM: CT CHEST, ABDOMEN AND PELVIS WITHOUT CONTRAST TECHNIQUE: Multidetector CT imaging of the chest, abdomen and pelvis was performed following the standard protocol without IV contrast. COMPARISON:  08/03/2017, 11/14/2014 FINDINGS: CT CHEST FINDINGS Cardiovascular: Extensive atherosclerosis of the thoracic aorta without significant aneurysm. No mediastinal hemorrhage or hematoma. Previous coronary bypass and TAVR noted. Heart is enlarged. No pericardial effusion. Mediastinum/Nodes: No enlarged mediastinal, hilar, or axillary lymph nodes. Thyroid gland, trachea, and  esophagus demonstrate no significant findings. Lungs/Pleura: Subcentimeter peripheral and subpleural calcified granuloma in the right upper lobe, image 48 series 9. Bibasilar atelectasis noted. Respiratory motion artifact through the lower lobes. No significant focal pneumonia, collapse or consolidation. No interstitial process or edema. No other pleural abnormality, pleural effusion or pneumothorax. Trachea central airways appear patent. Musculoskeletal: Degenerative changes noted spine. No acute compression fracture. CT ABDOMEN PELVIS FINDINGS Hepatobiliary: No focal liver abnormality is seen. Status post cholecystectomy. No biliary dilatation. Pancreas: Unremarkable. No pancreatic ductal dilatation or surrounding inflammatory changes. Spleen: Normal in size without focal abnormality. Adrenals/Urinary Tract: Normal adrenal glands. No renal obstruction, obstructive uropathy, hydronephrosis, hydroureter. Bladder wall thickening noted appearing chronic with small bladder diverticula. Stomach/Bowel: Negative for bowel obstruction, significant dilatation, ileus, free air. No fluid collection or abscess. Scattered colonic diverticulosis. No acute inflammatory process. Vascular/Lymphatic: Aorto bi-iliac aneurysms again noted without evidence of acute rupture or retroperitoneal hemorrhage. Distal aortic aneurysm measures 5.3 cm in maximal diameter, image 85 series 7. Right common iliac aneurysm measures 5.9 cm and left common iliac aneurysm measures 4 cm. Right internal iliac aneurysm measures 4.8 cm. These all have enlarged compared 2016. Right common femoral aneurysm measures 2.1 cm. Reproductive: No significant finding by CT. Other: No abdominal wall hernia or abnormality. No abdominopelvic ascites. Musculoskeletal: Left hip arthroplasty creates artifact. Degenerative changes noted spine. No acute compression fracture IMPRESSION: Thoracic aortic atherosclerosis without aneurysm. Cardiomegaly with bibasilar atelectasis.  No acute intrathoracic finding. Enlargement of the aorto bi-iliac aneurysms compared 2016 without evidence of acute rupture or retroperitoneal hemorrhage. Recommend followup by abdomen and pelvis CTA in 3-6 months, and vascular surgery referral/consultation if not already obtained. This recommendation follows ACR consensus guidelines: White Paper of the ACR Incidental Findings Committee II on Vascular Findings. J Am Coll Radiol 2013; 10:789-794. Interval cholecystectomy No other acute intra-abdominal or pelvic finding by noncontrast CT. Colonic diverticulosis Chronic appearing bladder wall thickening with scattered diverticula Electronically Signed   By: Judie Petit.  Shick M.D.   On: 08/03/2017 07:27   Ct Head Wo Contrast  Result Date: 08/03/2017 CLINICAL DATA:  Altered level of consciousness EXAM: CT HEAD WITHOUT CONTRAST TECHNIQUE: Contiguous axial images were obtained from the base of the skull through the vertex without intravenous contrast. COMPARISON:  02/12/2017 FINDINGS: Brain: No evidence of acute infarction, hemorrhage, hydrocephalus, extra-axial collection or mass lesion/mass effect. Small remote infarcts along the right more than left high cerebral convexities and bilateral cerebellum. Chronic small vessel ischemic type gliosis in the cerebral white matter. Generalized atrophy, stable. Vascular: Atherosclerotic calcification Skull: No acute or aggressive finding Sinuses/Orbits: Chronic bilateral mastoid opacification  with negative nasopharynx. Chronic low calcifications and right cataract resection IMPRESSION: 1. No acute finding or change from 2018. 2. Chronic small vessel ischemic injury. 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 Electronically Signed   By: Marnee Spring M.D.   On: 08/03/2017 07:16   Ct Chest Wo Contrast  Result Date: 08/03/2017 CLINICAL DATA:  Altered mental status, fever, cough, vomiting EXAM: CT CHEST, ABDOMEN AND PELVIS WITHOUT CONTRAST TECHNIQUE:  Multidetector CT imaging of the chest, abdomen and pelvis was performed following the standard protocol without IV contrast. COMPARISON:  08/03/2017, 11/14/2014 FINDINGS: CT CHEST FINDINGS Cardiovascular: Extensive atherosclerosis of the thoracic aorta without significant aneurysm. No mediastinal hemorrhage or hematoma. Previous coronary bypass and TAVR noted. Heart is enlarged. No pericardial effusion. Mediastinum/Nodes: No enlarged mediastinal, hilar, or axillary lymph nodes. Thyroid gland, trachea, and esophagus demonstrate no significant findings. Lungs/Pleura: Subcentimeter peripheral and subpleural calcified granuloma in the right upper lobe, image 48 series 9. Bibasilar atelectasis noted. Respiratory motion artifact through the lower lobes. No significant focal pneumonia, collapse or consolidation. No interstitial process or edema. No other pleural abnormality, pleural effusion or pneumothorax. Trachea central airways appear patent. Musculoskeletal: Degenerative changes noted spine. No acute compression fracture. CT ABDOMEN PELVIS FINDINGS Hepatobiliary: No focal liver abnormality is seen. Status post cholecystectomy. No biliary dilatation. Pancreas: Unremarkable. No pancreatic ductal dilatation or surrounding inflammatory changes. Spleen: Normal in size without focal abnormality. Adrenals/Urinary Tract: Normal adrenal glands. No renal obstruction, obstructive uropathy, hydronephrosis, hydroureter. Bladder wall thickening noted appearing chronic with small bladder diverticula. Stomach/Bowel: Negative for bowel obstruction, significant dilatation, ileus, free air. No fluid collection or abscess. Scattered colonic diverticulosis. No acute inflammatory process. Vascular/Lymphatic: Aorto bi-iliac aneurysms again noted without evidence of acute rupture or retroperitoneal hemorrhage. Distal aortic aneurysm measures 5.3 cm in maximal diameter, image 85 series 7. Right common iliac aneurysm measures 5.9 cm and left  common iliac aneurysm measures 4 cm. Right internal iliac aneurysm measures 4.8 cm. These all have enlarged compared 2016. Right common femoral aneurysm measures 2.1 cm. Reproductive: No significant finding by CT. Other: No abdominal wall hernia or abnormality. No abdominopelvic ascites. Musculoskeletal: Left hip arthroplasty creates artifact. Degenerative changes noted spine. No acute compression fracture IMPRESSION: Thoracic aortic atherosclerosis without aneurysm. Cardiomegaly with bibasilar atelectasis. No acute intrathoracic finding. Enlargement of the aorto bi-iliac aneurysms compared 2016 without evidence of acute rupture or retroperitoneal hemorrhage. Recommend followup by abdomen and pelvis CTA in 3-6 months, and vascular surgery referral/consultation if not already obtained. This recommendation follows ACR consensus guidelines: White Paper of the ACR Incidental Findings Committee II on Vascular Findings. J Am Coll Radiol 2013; 10:789-794. Interval cholecystectomy No other acute intra-abdominal or pelvic finding by noncontrast CT. Colonic diverticulosis Chronic appearing bladder wall thickening with scattered diverticula Electronically Signed   By: Judie Petit.  Shick M.D.   On: 08/03/2017 07:27   Dg Chest Port 1 View  Result Date: 08/03/2017 CLINICAL DATA:  Altered mental status EXAM: PORTABLE CHEST 1 VIEW COMPARISON:  02/22/2017 FINDINGS: Stable cardiomegaly with tortuous atherosclerotic aorta. Interstitial pulmonary edema is noted. The patient is status post CABG with aortic valvular replacement. Colonic interposition over the liver shadow is identified. No acute pulmonary consolidation. There is minimal bibasilar atelectasis. No acute osseous abnormality. Degenerative change noted about both shoulders. IMPRESSION: Stable cardiomegaly with post CABG change. Tortuous atherosclerotic nonaneurysmal aorta. Interstitial edema is noted since prior. Electronically Signed   By: Tollie Eth M.D.   On: 08/03/2017 03:25      Cardiac  Studies   2D Echo- pending  Prior Echo 01/2017 Study Conclusions  - Left ventricle: The cavity size was normal. Systolic function was   moderately reduced. The estimated ejection fraction was in the   range of 35% to 40%. Mild hypokinesis of the apical myocardium;   consistent with ischemia in the distribution of the left anterior   descending coronary artery. Severe hypokinesis and scarring of   the basal-midinferolateral and inferior myocardium; consistent   with infarction in the distribution of the right coronary or left   circumflex coronary artery. Doppler parameters are consistent   with abnormal left ventricular relaxation (grade 1 diastolic   dysfunction). Doppler parameters are consistent with elevated   mean left atrial filling pressure. - Aortic valve: A stent-valve (TAVR) bioprosthesis was present and   functioning normally. There was trivial regurgitation. Valve area   (VTI): 2.81 cm^2. Valve area (Vmax): 2.97 cm^2. Valve area   (Vmean): 2.85 cm^2. - Mitral valve: There was mild regurgitation. - Left atrium: The atrium was mildly dilated. - Right atrium: The atrium was mildly dilated.   Patient Profile     84 y.o. male with CAD s/p remote CABG in 1999 (known occluded SVG-RCA and 70% obstruction in the SVG-OM by cath in 01/2017>>too high risk for PCI, managed medically, LIMA-LAD and SVG-D1/ramus both patent at that time), severe aortic stenosis status post TAVR in 2014, history of AAA, chronic kidney disease, chronic combined systolic/diastolic heart failure, paroxysmal atrial fibrillation, chronic left bundle branch block and CVA. Presented to [facility] ED 08/03/17 with fever and AMS. Diagnosed with SIRS. Cardiology consulted for elevated troponin (1.23>>0.92 >>0.39).  Assessment & Plan    1. SIRS: Unknown source of infection. W/u pending. Broad spectrum empiric antibiotics w/ Vanc + Zosyn, per IM and pharmacy. CT of head, chest, abdomen and pelvis unremarkable  for acute abnormalities/infection. No growth on blood or urine cultures thus far. Further f/u and management per primary.   2. Elevated Troponin: Max troponin this admission was 1.23. Level down trending, 1.23>>0.92>>0.39. 2D echo pending. He denies CP or dyspnea, but has been confused. Presented with AMS, thus history unreliable. Will determine further w/u based on echo findings.   3. Acute on Chronic Combined Systolic and Diastolic HF:  Has known systolic HF with previous EF moderately reduced at 35-40%. BNP this admit at 2,896. Edema noted on CXR. He has bibasilar crackles on exam. BP improved. No longer hypotensive. Recommend restarting lasix. Monitor BP, renal function and electrolyte closely. Monitor volume status. Strict I/Os and daily weights. We can later try to add back other HR meds, if BP allows.   4. CAD: s/p remote CABG in 1999 (known occluded SVG-RCA and 70% obstruction in the SVG-OM by cath in 01/2017>>too high risk for PCI, managed medically, LIMA-LAD and SVG-D1/ramus patent at that time). Troponin's elevated but level down trending, 1.23>>0.92>>0.39. 2D echo pending. He denies CP but history from patient is unreliable given confusion/ AMS.   5. Aortic Stenosis: s/p TAVR in 2014. F/u echo pending.  6. Hypotension: resolved. BP in the 120s systolic today. Home antihypertensives including amlodipine, coreg, losartan and Lasix held on admit. Given elevated BNP and abnormal lung exam, would recommend restarting Lasix to help with diurese. Monitor BP response. We can slowly add back other meds, if needed, based on BP.    For questions or updates, please contact [facility] Please consult [email or web address] for contact info under Cardiology/STEMI.      Signed, Robbie Lis, PA-C  08/04/2017, 10:33 AM

## 2017-08-04 NOTE — Progress Notes (Signed)
ANTICOAGULATION CONSULT NOTE   Pharmacy Consult for Heparin Indication: chest pain/ACS  Allergies  Allergen Reactions  . Ciprofloxacin Other (See Comments)    REACTION: mild delirium    Patient Measurements: Height: 5\' 7"  (170.2 cm) Weight: 151 lb 3.8 oz (68.6 kg) IBW/kg (Calculated) : 66.1  Vital Signs: Temp: 98 F (36.7 C) (05/09 2333) Temp Source: Oral (05/09 2333) BP: 117/45 (05/09 2333) Pulse Rate: 54 (05/09 2333)  Labs: Recent Labs    08/03/17 0242 08/03/17 0245 08/03/17 0623 08/03/17 1317 08/03/17 2010 08/04/17 0037  HGB 11.1*  --  10.1*  --   --   --   HCT 36.1*  --  32.8*  --   --   --   PLT 124*  --  99*  --   --   --   APTT  --   --  37*  --   --   --   LABPROT  --   --  16.5*  --   --   --   INR  --   --  1.34  --   --   --   HEPARINUNFRC  --   --   --   --   --  0.56  CREATININE  --  1.38* 1.46*  --   --   --   TROPONINI  --   --  1.23* 0.92* 0.39*  --     Estimated Creatinine Clearance: 33.3 mL/min (A) (by C-G formula based on SCr of 1.46 mg/dL (H)).  Assessment: CC/HPI: Here with fever, confusion and productive cough. Septic   PMH: TAVR, CABG, AAA, CKD, CHF, anemia, thrombocytopenia   Anticoag: none pta iv hep for r/o acs   Renal: SCr 1.46   Heme/Onc: H&H 10.1/32.8, Plt 99  5/10 AM update: initial heparin level is therapeutic   Goal of Therapy:  Heparin level 0.3-0.7 units/ml Monitor platelets by anticoagulation protocol: Yes   Plan:  Cont heparin at 800 units/hr Heparin level with AM labs  09-12-1978, PharmD, BCPS Clinical Pharmacist Phone: [phone]

## 2017-08-05 LAB — RESPIRATORY PANEL BY PCR
ADENOVIRUS-RVPPCR: NOT DETECTED
BORDETELLA PERTUSSIS-RVPCR: NOT DETECTED
CORONAVIRUS 229E-RVPPCR: NOT DETECTED
CORONAVIRUS HKU1-RVPPCR: NOT DETECTED
CORONAVIRUS NL63-RVPPCR: NOT DETECTED
Chlamydophila pneumoniae: NOT DETECTED
Coronavirus OC43: NOT DETECTED
Influenza A: NOT DETECTED
Influenza B: NOT DETECTED
METAPNEUMOVIRUS-RVPPCR: NOT DETECTED
Mycoplasma pneumoniae: NOT DETECTED
PARAINFLUENZA VIRUS 1-RVPPCR: NOT DETECTED
PARAINFLUENZA VIRUS 2-RVPPCR: NOT DETECTED
Parainfluenza Virus 3: NOT DETECTED
Parainfluenza Virus 4: NOT DETECTED
Respiratory Syncytial Virus: NOT DETECTED
Rhinovirus / Enterovirus: NOT DETECTED

## 2017-08-05 LAB — CBC
HEMATOCRIT: 33.9 % — AB (ref 39.0–52.0)
HEMOGLOBIN: 10.3 g/dL — AB (ref 13.0–17.0)
MCH: 26.4 pg (ref 26.0–34.0)
MCHC: 30.4 g/dL (ref 30.0–36.0)
MCV: 86.9 fL (ref 78.0–100.0)
Platelets: 144 10*3/uL — ABNORMAL LOW (ref 150–400)
RBC: 3.9 MIL/uL — ABNORMAL LOW (ref 4.22–5.81)
RDW: 17.9 % — AB (ref 11.5–15.5)
WBC: 8.8 10*3/uL (ref 4.0–10.5)

## 2017-08-05 LAB — COMPREHENSIVE METABOLIC PANEL
ALBUMIN: 2.8 g/dL — AB (ref 3.5–5.0)
ALT: 29 U/L (ref 17–63)
AST: 31 U/L (ref 15–41)
Alkaline Phosphatase: 104 U/L (ref 38–126)
Anion gap: 12 (ref 5–15)
BUN: 31 mg/dL — ABNORMAL HIGH (ref 6–20)
CHLORIDE: 100 mmol/L — AB (ref 101–111)
CO2: 24 mmol/L (ref 22–32)
Calcium: 7.8 mg/dL — ABNORMAL LOW (ref 8.9–10.3)
Creatinine, Ser: 1.39 mg/dL — ABNORMAL HIGH (ref 0.61–1.24)
GFR calc Af Amer: 51 mL/min — ABNORMAL LOW (ref >60)
GFR calc non Af Amer: 44 mL/min — ABNORMAL LOW (ref >60)
GLUCOSE: 89 mg/dL (ref 65–99)
POTASSIUM: 3.8 mmol/L (ref 3.5–5.1)
SODIUM: 136 mmol/L (ref 135–145)
Total Bilirubin: 0.8 mg/dL (ref 0.3–1.2)
Total Protein: 6 g/dL — ABNORMAL LOW (ref 6.5–8.1)

## 2017-08-05 LAB — HEPARIN LEVEL (UNFRACTIONATED): HEPARIN UNFRACTIONATED: 0.3 [IU]/mL (ref 0.30–0.70)

## 2017-08-05 LAB — PROCALCITONIN: PROCALCITONIN: 0.92 ng/mL

## 2017-08-05 LAB — MAGNESIUM: Magnesium: 2 mg/dL (ref 1.7–2.4)

## 2017-08-05 LAB — GLUCOSE, CAPILLARY
GLUCOSE-CAPILLARY: 96 mg/dL (ref 65–99)
GLUCOSE-CAPILLARY: 157 mg/dL — AB (ref 65–99)
Glucose-Capillary: 120 mg/dL — ABNORMAL HIGH (ref 65–99)

## 2017-08-05 MED ORDER — ENOXAPARIN SODIUM 40 MG/0.4ML ~~LOC~~ SOLN
40.0000 mg | SUBCUTANEOUS | Status: DC
  Administered 2017-08-05 – 2017-08-06 (×2): 40 mg via SUBCUTANEOUS
  Filled 2017-08-05 (×2): qty 0.4

## 2017-08-05 MED ORDER — FUROSEMIDE 40 MG PO TABS
40.0000 mg | ORAL_TABLET | Freq: Every day | ORAL | Status: DC
  Administered 2017-08-06 – 2017-08-07 (×2): 40 mg via ORAL
  Filled 2017-08-05 (×2): qty 1

## 2017-08-05 NOTE — Progress Notes (Signed)
PROGRESS NOTE    Hunter Choi  QMG:[id number] DOB: [birth year] DOA: 08/03/2017 PCP: Leeroy Cha, MD   Brief Narrative:  Hunter Choi is a 84 y.o. male with history of CAD status post CABG, status post TAVR, abdominal aortic aneurysm, chronic kidney disease, chronic systolic heart failure, rheumatoid arthritis, hypothyroidism, chronic anemia and thrombocytopenia was brought to the ER after patient was found to have fever of 103 F at home with confusion.  As per the patient's son patient has been having cough with productive sputum last 2 days with upper respiratory tract symptoms.  Patient also complained of some epigastric discomfort.  Denies any chest pain.  Over the last 2 days patient has been having increasing fever and since patient became more confused patient was brought to the ER.  Assessment & Plan:   Principal Problem:   SIRS (systemic inflammatory response syndrome) (HCC) Active Problems:   Rheumatoid arthritis (HCC)   S/P TAVR (transcatheter aortic valve replacement)   Chronic combined systolic and diastolic congestive heart failure (HCC)   CAD (coronary artery disease)   Essential hypertension   Acute encephalopathy   Acute on chronic combined systolic and diastolic CHF (congestive heart failure) ([place])  1. Fever without a source  Systemic Inflammatory Response Syndrome - Afebrile since initial fever on presentation.  CT chest abdomen pelvis without identified cause.  Head CT without identified cause.  Sons had a bit of a cough and he's had cough recently as well.  His confusion seems to be improved.  No meningismus.   1. Remains afebrile today 2. Negative flu.  Negative RVP. 3. Follow blood cx NGTDx1 day.  Negative urine cx. 4. Negative MRSA PCR 5. Will d/c vanc, continue zosyn for now at least until NGTDx48 hrs, then consider d/c abx without identifiable source or recurrent fevers. 6. ESR, CRP, follow procalcitonin   2. Elevated Troponin:  Suspect demand  ischemia in setting of above.  On heparin gtt.  Cardiology following.  Cath 01/2017 and at that time, thought not candidate for PCI.   No plans for cath at this point per cards. 1. Appreciate cards recs 2. ASA, plavix, statin 3. Holding beta blocker for now with diuresis  3. History of rheumatoid arthritis will hold methotrexate. 1. Started on hydrocortisone, will taper this gradually and resume home prednisone as long as he continues to improve  4. History of TAVR - on ASA daily . 5. History of CAD status post CABG.   Appreciate cardiology recommendations. 1. ASA, plavix, statin 2. Holding beta blocker for now (resume at d/c per cards).   6. Acute encephalopathy likely from fever  Possible Dementia: Pt A&Ox2 with me.  On discussion with his son, he notes it's normal for him not to know the date.  Patient is extremely hard of hearing, which complicates this as well.  Continue to monitor. 1. Delirium precautions  2. CT head without notable cause  7. Chronic combined systolic and diastolic heart failure last EF measured in November 2018 was 35 to 40%.  Repeat echo with EF 30-35%. 1. Lasix restarted by cardiology.  Still with some crackles today at bases. 2. Holding cozaar.  Plan to resume this today given stable BP if labs look ok.  Holding beta blocker until d/c per cards.     8. History of hypertension - lasix as above.  Holding cozaar and metop and amlodipine.   9. AKI on Chronic kidney disease stage III.  creatinine peaked to 1.46, improving now.  Follow  with diuresis.  10. Chronic anemia and Thrombocytopenia -  follow CBC with differential.  Labs pending today.  11. History of hypothyroidism on Synthroid.  12. Hyperlipidemia on statins.  13. Elevated Alk Phos: mild, follow  14. Aorto Bililiac Aneurysms: needs 3-6 month repeat imaging and vascular follow up  15. Bradycardia: follow EKG, noted overnight last night.  Currently holding beta blocker   DVT prophylaxis: heparin  gtt Code Status: full code Family Communication: son over phone 5/10 Disposition Plan: pending improvement   Consultants:   cardiology  Procedures:  5/10 echo Study Conclusions  - Left ventricle: The cavity size was normal. Wall thickness was   increased in a pattern of moderate LVH. Systolic function was   moderately to severely reduced. The estimated ejection fraction   was in the range of 30% to 35%. Diffuse hypokinesis with regional   variation. The study is not technically sufficient to allow   evaluation of LV diastolic function. - Aortic valve: s/p TAVR. No obstruction. Trivial perivalvular   leak. Mean gradient (S): 6 mm Hg. Peak gradient (S): 13 mm Hg.   Valve area (VTI): 1.7 cm^2. Valve area (Vmax): 1.56 cm^2. Valve   area (Vmean): 1.54 cm^2. - Mitral valve: Mildly thickened leaflets . There was moderate   regurgitation. - Left atrium: Severely dilated. - Right ventricle: The cavity size was mildly dilated. Mildly   reduced systolic function. - Right atrium: The atrium was mildly dilated.  Impressions:  - Compared to a prior study in 2018, the LVEF is lower at 30-35%.  Antimicrobials:  Anti-infectives (From admission, onward)   Start     Dose/Rate Route Frequency Ordered Stop   08/04/17 0300  vancomycin (VANCOCIN) IVPB 1000 mg/200 mL premix  Status:  Discontinued     1,000 mg 200 mL/hr over 60 Minutes Intravenous Every 24 hours 08/03/17 0356 08/04/17 1535   08/03/17 1000  piperacillin-tazobactam (ZOSYN) IVPB 3.375 g     3.375 g 12.5 mL/hr over 240 Minutes Intravenous Every 8 hours 08/03/17 0356     08/03/17 0245  piperacillin-tazobactam (ZOSYN) IVPB 3.375 g     3.375 g 100 mL/hr over 30 Minutes Intravenous  Once 08/03/17 0242 08/03/17 0334   08/03/17 0245  vancomycin (VANCOCIN) IVPB 1000 mg/200 mL premix     1,000 mg 200 mL/hr over 60 Minutes Intravenous  Once 08/03/17 0242 08/03/17 0414     Subjective: Hard of hearing. Denies pain or  complaint. A&Ox2.  Objective: Vitals:   08/05/17 0400 08/05/17 0415 08/05/17 0500 08/05/17 0600  BP:  136/64    Pulse: (!) 54 (!) 54 (!) 52 (!) 49  Resp: _0 Temp:  98.4 F (36.9 C)    TempSrc:  Oral    SpO2: 100% 100% 100% 99%  Weight:      Height:        Intake/Output Summary (Last 24 hours) at 08/05/2017 0754 Last data filed at 08/05/2017 0415 Gross per 24 hour  Intake 446 ml  Output 1475 ml  Net -1029 ml   Filed Weights   08/03/17 1851 08/04/17 0250 08/05/17 0246  Weight: 68.6 kg (151 lb 3.8 oz) 68.5 kg (151 lb 0.2 oz) 68.3 kg (150 lb 9.2 oz)    Examination:  General: No acute distress. Neck: no cervical LAD Cardiovascular: Heart sounds show a regular rate, and rhythm. No gallops or rubs. No murmurs. No JVD. Lungs: Crackles at bases, no increased wob Abdomen: Soft, nontender, nondistended with normal active bowel sounds.  No masses. No hepatosplenomegaly. Neurological: Alert and oriented 2. Moves all extremities 4 . Cranial nerves II through XII grossly intact. Skin: Warm and dry. No rashes or lesions. Extremities: No clubbing or cyanosis. No edema.  Psychiatric: Mood and affect are normal. Insight and judgment are appropriate.  Data Reviewed: I have personally reviewed following labs and imaging studies  CBC: Recent Labs  Lab 08/03/17 0242 08/03/17 0623 08/04/17 0233  WBC 9.1 9.8 8.3  NEUTROABS 5.8 5.5  --   HGB 11.1* 10.1* 10.3*  HCT 36.1* 32.8* 34.0*  MCV 85.7 86.3 86.5  PLT 124* 99* 448*   Basic Metabolic Panel: Recent Labs  Lab 08/03/17 0245 08/03/17 0623 08/04/17 0233  NA 134* 134* 137  K 4.4 4.2 4.4  CL 97* 100* 104  CO2 _0 GLUCOSE 137* 100* 137*  BUN 33* 34* 33*  CREATININE 1.38* 1.46* 1.31*  CALCIUM 8.6* 8.0* 7.9*   GFR: Estimated Creatinine Clearance: 37.1 mL/min (A) (by C-G formula based on SCr of 1.31 mg/dL (H)). Liver Function Tests: Recent Labs  Lab 08/03/17 0245 08/03/17 0623  AST 35 32  ALT 43 35   ALKPHOS 170* 136*  BILITOT 0.8 0.9  PROT 6.5 5.9*  ALBUMIN 3.3* 2.9*   No results for input(s): LIPASE, AMYLASE in the last 168 hours. No results for input(s): AMMONIA in the last 168 hours. Coagulation Profile: Recent Labs  Lab 08/03/17 0623  INR 1.34   Cardiac Enzymes: Recent Labs  Lab 08/03/17 0623 08/03/17 1317 08/03/17 2010  TROPONINI 1.23* 0.92* 0.39*   BNP (last 3 results) No results for input(s): PROBNP in the last 8760 hours. HbA1C: No results for input(s): HGBA1C in the last 72 hours. CBG: Recent Labs  Lab 08/03/17 1710 08/03/17 2332 08/04/17 0835 08/04/17 1700 08/05/17 0018  GLUCAP 142* 149* 125* 126* 120*   Lipid Profile: No results for input(s): CHOL, HDL, LDLCALC, TRIG, CHOLHDL, LDLDIRECT in the last 72 hours. Thyroid Function Tests: Recent Labs    08/04/17 0233  TSH 1.603   Anemia Panel: No results for input(s): VITAMINB12, FOLATE, FERRITIN, TIBC, IRON, RETICCTPCT in the last 72 hours. Sepsis Labs: Recent Labs  Lab 08/03/17 0259 08/03/17 0623 08/03/17 2010  PROCALCITON  --  1.61  --   LATICACIDVEN 1.03 1.0 1.8    Recent Results (from the past 240 hour(s))  Blood Culture (routine x 2)     Status: None (Preliminary result)   Collection Time: 08/03/17  2:45 AM  Result Value Ref Range Status   Specimen Description BLOOD RIGHT FOREARM  Final   Special Requests   Final    BOTTLES DRAWN AEROBIC AND ANAEROBIC Blood Culture adequate volume   Culture   Final    NO GROWTH 1 DAY Performed at [facility], [place] [address] [place], [place] [address]    Report Status PENDING  Incomplete  Blood Culture (routine x 2)     Status: None (Preliminary result)   Collection Time: 08/03/17  2:50 AM  Result Value Ref Range Status   Specimen Description BLOOD LEFT ANTECUBITAL  Final   Special Requests   Final    BOTTLES DRAWN AEROBIC AND ANAEROBIC Blood Culture adequate volume   Culture   Final    NO GROWTH 1 DAY Performed at [place], [place] [address], [place], [place] [address]    Report Status PENDING  Incomplete  Urine culture     Status: None   Collection Time: 08/03/17  3:52  AM  Result Value Ref Range Status   Specimen Description URINE, CATHETERIZED  Final   Special Requests NONE  Final   Culture   Final    NO GROWTH Performed at [facility], [address], [place], [place] [address]    Report Status 08/04/2017 FINAL  Final  MRSA PCR Screening     Status: None   Collection Time: 08/03/17  6:56 PM  Result Value Ref Range Status   MRSA by PCR NEGATIVE NEGATIVE Final    Comment:        The GeneXpert MRSA Assay (FDA approved for NASAL specimens only), is one component of a comprehensive MRSA colonization surveillance program. It is not intended to diagnose MRSA infection nor to guide or monitor treatment for MRSA infections. Performed at [facility], [place] [address] [place], [place] [address]   Respiratory Panel by PCR     Status: None   Collection Time: 08/04/17  6:04 PM  Result Value Ref Range Status   Adenovirus NOT DETECTED NOT DETECTED Final   Coronavirus 229E NOT DETECTED NOT DETECTED Final   Coronavirus HKU1 NOT DETECTED NOT DETECTED Final   Coronavirus NL63 NOT DETECTED NOT DETECTED Final   Coronavirus OC43 NOT DETECTED NOT DETECTED Final   Metapneumovirus NOT DETECTED NOT DETECTED Final   Rhinovirus / Enterovirus NOT DETECTED NOT DETECTED Final   Influenza A NOT DETECTED NOT DETECTED Final   Influenza B NOT DETECTED NOT DETECTED Final   Parainfluenza Virus 1 NOT DETECTED NOT DETECTED Final   Parainfluenza Virus 2 NOT DETECTED NOT DETECTED Final   Parainfluenza Virus 3 NOT DETECTED NOT DETECTED Final   Parainfluenza Virus 4 NOT DETECTED NOT DETECTED Final   Respiratory Syncytial Virus NOT DETECTED NOT DETECTED Final   Bordetella pertussis NOT DETECTED NOT DETECTED Final   Chlamydophila pneumoniae NOT DETECTED NOT DETECTED Final   Mycoplasma pneumoniae NOT DETECTED NOT DETECTED  Final    Comment: Performed at [facility], [place] [address] [place], [place] [address]         Radiology Studies: No results found.      Scheduled Meds: . aspirin EC  81 mg Oral Daily  . atorvastatin  20 mg Oral Daily  . clopidogrel  75 mg Oral Daily  . fluticasone  1 spray Each Nare Daily  . folic acid  3 mg Oral Daily  . furosemide  40 mg Intravenous BID  . hydrocortisone sod succinate (SOLU-CORTEF) inj  50 mg Intravenous Q12H  . [START ON 08/06/2017] hydrocortisone sod succinate (SOLU-CORTEF) inj  50 mg Intravenous Daily  . lactose free nutrition  237 mL Oral Q24H  . levothyroxine  50 mcg Oral QAC breakfast  . pantoprazole  40 mg Oral Daily  . [START ON 08/07/2017] predniSONE  5 mg Oral Daily   Continuous Infusions: . heparin 800 Units/hr (08/04/17 2035)  . piperacillin-tazobactam (ZOSYN)  IV 3.375 g (08/05/17 0505)     LOS: 2 days    Time spent: over 38 min    Fayrene Helper, MD [facility] Pager [phone]  If 7PM-7AM, please contact night-coverage [email or web address] Password [facility] 08/05/2017, 7:54 AM

## 2017-08-05 NOTE — Progress Notes (Signed)
ANTICOAGULATION CONSULT NOTE   Pharmacy Consult for Heparin Indication: chest pain/ACS  Allergies  Allergen Reactions  . Ciprofloxacin Other (See Comments)    REACTION: mild delirium    Patient Measurements: Height: 5\' 7"  (170.2 cm) Weight: 150 lb 9.2 oz (68.3 kg) IBW/kg (Calculated) : 66.1  Vital Signs: Temp: 98.4 F (36.9 C) (05/11 0415) Temp Source: Oral (05/11 0415) BP: 132/54 (05/11 0800) Pulse Rate: 112 (05/11 0800)  Labs: Recent Labs    08/03/17 0623 08/03/17 1317 08/03/17 2010 08/04/17 0037 08/04/17 0233 08/05/17 0617  HGB 10.1*  --   --   --  10.3* 10.3*  HCT 32.8*  --   --   --  34.0* 33.9*  PLT 99*  --   --   --  120* 144*  APTT 37*  --   --   --   --   --   LABPROT 16.5*  --   --   --   --   --   INR 1.34  --   --   --   --   --   HEPARINUNFRC  --   --   --  0.56 0.61 0.30  CREATININE 1.46*  --   --   --  1.31* 1.39*  TROPONINI 1.23* 0.92* 0.39*  --   --   --     Estimated Creatinine Clearance: 35 mL/min (A) (by C-G formula based on SCr of 1.39 mg/dL (H)).  Assessment: CC/HPI: Here with fever, confusion and productive cough.   PMH: TAVR, CABG, AAA, CKD, CHF, anemia, thrombocytopenia   Anticoag: none pta iv hep for r/o acs   Renal: SCr 1.39   Heme/Onc: H&H 10.3/33.9, Plt 144. No bleeding noted.  Heparin level low-end of therapeutic at 0.3. No drip interruptions per RN.   Goal of Therapy:  Heparin level 0.3-0.7 units/ml Monitor platelets by anticoagulation protocol: Yes   Plan:  Increase heparin to 850 units/hr Heparin level with AM labs  02-20-1999, PharmD Pharmacy Resident Clinical Phone for 08/05/2017 until 3:30pm: [phone] If after 3:30pm, please call main pharmacy at [phone] 08/05/2017 9:22 AM

## 2017-08-05 NOTE — Progress Notes (Signed)
Progress Note  Patient Name: Hunter Choi Date of Encounter: 08/05/2017  Primary Cardiologist: Lesleigh Noe, MD   Subjective   84 year old gentleman with history of coronary artery disease and coronary artery bypass grafting.  He presents with sepsis syndrome with a fever of 104.  He was found to have elevated troponin levels.  The elevated troponin levels were thought to be due to to demand ischemia.  Echocardiogram from May 10 reveals moderately reduced left ventricular systolic function with an ejection fraction of 30 to 35%.  He is status post TAVR.  The previous echo in November, 2018 the ejection fraction has fallen very slightly.  He had a cath several months ago.  He was thought to be not a good candidate for PCI.  He seems to be feeling better.  He seems to have some degree of dementia He could not remember much about his presentation yesterday that this might be due to the sepsis syndrome.  Inpatient Medications    Scheduled Meds: . aspirin EC  81 mg Oral Daily  . atorvastatin  20 mg Oral Daily  . clopidogrel  75 mg Oral Daily  . fluticasone  1 spray Each Nare Daily  . folic acid  3 mg Oral Daily  . furosemide  40 mg Intravenous BID  . hydrocortisone sod succinate (SOLU-CORTEF) inj  50 mg Intravenous Q12H  . [START ON 08/06/2017] hydrocortisone sod succinate (SOLU-CORTEF) inj  50 mg Intravenous Daily  . lactose free nutrition  237 mL Oral Q24H  . levothyroxine  50 mcg Oral QAC breakfast  . pantoprazole  40 mg Oral Daily  . [START ON 08/07/2017] predniSONE  5 mg Oral Daily   Continuous Infusions: . heparin 850 Units/hr (08/05/17 1008)  . piperacillin-tazobactam (ZOSYN)  IV Stopped (08/05/17 0826)   PRN Meds: acetaminophen **OR** acetaminophen, nitroGLYCERIN, ondansetron **OR** ondansetron (ZOFRAN) IV   Vital Signs    Vitals:   08/05/17 0415 08/05/17 0500 08/05/17 0600 08/05/17 0800  BP: 136/64   (!) 132/54  Pulse: (!) 54 (!) 52 (!) 49 60  Resp: 15 13 13  18   Temp: 98.4 F (36.9 C)   98 F (36.7 C)  TempSrc: Oral     SpO2: 100% 100% 99% 98%  Weight:      Height:        Intake/Output Summary (Last 24 hours) at 08/05/2017 1121 Last data filed at 08/05/2017 0826 Gross per 24 hour  Intake 796 ml  Output 1675 ml  Net -879 ml   Filed Weights   08/03/17 1851 08/04/17 0250 08/05/17 0246  Weight: 151 lb 3.8 oz (68.6 kg) 151 lb 0.2 oz (68.5 kg) 150 lb 9.2 oz (68.3 kg)    Telemetry     sinus brady ,   BBB   - Personally Reviewed  ECG       Physical Exam    GEN:  Elderly, frail gentleman appears to be chronically ill. Neck: No JVD Cardiac:  Rate S1-S2.  Soft systolic murmur Respiratory: Clear to auscultation bilaterally. GI: Soft, nontender, non-distended  MS: No edema; No deformity. Neuro:  Nonfocal  Psych: Normal affect   Labs    Chemistry Recent Labs  Lab 08/03/17 0245 08/03/17 0623 08/04/17 0233 08/05/17 0617  NA 134* 134* 137 136  K 4.4 4.2 4.4 3.8  CL 97* 100* 104 100*  CO2 27 25 24 24   GLUCOSE 137* 100* 137* 89  BUN 33* 34* 33* 31*  CREATININE 1.38* 1.46* 1.31* 1.39*  CALCIUM 8.6* 8.0* 7.9* 7.8*  PROT 6.5 5.9*  --  6.0*  ALBUMIN 3.3* 2.9*  --  2.8*  AST 35 32  --  31  ALT 43 35  --  29  ALKPHOS 170* 136*  --  104  BILITOT 0.8 0.9  --  0.8  GFRNONAA 44* 41* 47* 44*  GFRAA 51* 48* 55* 51*  ANIONGAP 10 9 9 12      Hematology Recent Labs  Lab 08/03/17 0623 08/04/17 0233 08/05/17 0617  WBC 9.8 8.3 8.8  RBC 3.80* 3.93* 3.90*  HGB 10.1* 10.3* 10.3*  HCT 32.8* 34.0* 33.9*  MCV 86.3 86.5 86.9  MCH 26.6 26.2 26.4  MCHC 30.8 30.3 30.4  RDW 18.0* 17.6* 17.9*  PLT 99* 120* 144*    Cardiac Enzymes Recent Labs  Lab 08/03/17 0623 08/03/17 1317 08/03/17 2010  TROPONINI 1.23* 0.92* 0.39*   No results for input(s): TROPIPOC in the last 168 hours.   BNP Recent Labs  Lab 08/03/17 2010  BNP 2,896.1*     DDimer No results for input(s): DDIMER in the last 168 hours.   Radiology    No results  found.  Cardiac Studies      Patient Profile     84 y.o. male with history of coronary artery disease and coronary artery bypass graft.    Assessment & Plan    1.  Elevated troponin level.  The patient has an elevated troponin level that is likely related to his sepsis syndrome.  He has known coronary artery disease.  He had a heart catheterization in November and was deemed not to be a candidate for PCI at that time. Continue with conservative management.  2.  Sepsis syndrome: Further plans per internal medicine team.  3.  Coronary artery disease: Cath in November, 2018 showed severe native three-vessel coronary disease with a patent LIMA to LAD, SVG to D1/ramus intermediate vessel.  There was 70% stenosis in the SVG to OM.  Saphenous vein graft to the distal right was occluded.  He has severe LV dysfunction with an ejection fraction of 25 to 35%. Grafts are 82 years old and it was recommend that we proceed with medical therapy since PCI of that old graft would be quite risky.  4.  Aortic stenosis: Status post TAVR.  Continue medical therapy. No active cardiology issues. Will sign off. Call for questions.  He will follow up with Dr. 26 .   For questions or updates, please contact [facility] Please consult [email or web address] for contact info under Cardiology/STEMI.      Signed, Katrinka Blazing, MD  08/05/2017, 11:21 AM

## 2017-08-06 ENCOUNTER — Inpatient Hospital Stay (HOSPITAL_COMMUNITY): Payer: Medicare Other

## 2017-08-06 LAB — CBC
HCT: 35.4 % — ABNORMAL LOW (ref 39.0–52.0)
HEMOGLOBIN: 10.9 g/dL — AB (ref 13.0–17.0)
MCH: 26.3 pg (ref 26.0–34.0)
MCHC: 30.8 g/dL (ref 30.0–36.0)
MCV: 85.3 fL (ref 78.0–100.0)
Platelets: 167 10*3/uL (ref 150–400)
RBC: 4.15 MIL/uL — AB (ref 4.22–5.81)
RDW: 17.4 % — ABNORMAL HIGH (ref 11.5–15.5)
WBC: 8.2 10*3/uL (ref 4.0–10.5)

## 2017-08-06 LAB — MAGNESIUM: Magnesium: 1.8 mg/dL (ref 1.7–2.4)

## 2017-08-06 LAB — COMPREHENSIVE METABOLIC PANEL
ALBUMIN: 2.9 g/dL — AB (ref 3.5–5.0)
ALT: 25 U/L (ref 17–63)
AST: 29 U/L (ref 15–41)
Alkaline Phosphatase: 102 U/L (ref 38–126)
Anion gap: 11 (ref 5–15)
BUN: 31 mg/dL — AB (ref 6–20)
CHLORIDE: 99 mmol/L — AB (ref 101–111)
CO2: 28 mmol/L (ref 22–32)
CREATININE: 1.51 mg/dL — AB (ref 0.61–1.24)
Calcium: 7.9 mg/dL — ABNORMAL LOW (ref 8.9–10.3)
GFR calc Af Amer: 46 mL/min — ABNORMAL LOW (ref >60)
GFR calc non Af Amer: 40 mL/min — ABNORMAL LOW (ref >60)
Glucose, Bld: 119 mg/dL — ABNORMAL HIGH (ref 65–99)
Potassium: 3.1 mmol/L — ABNORMAL LOW (ref 3.5–5.1)
SODIUM: 138 mmol/L (ref 135–145)
Total Bilirubin: 0.8 mg/dL (ref 0.3–1.2)
Total Protein: 6.4 g/dL — ABNORMAL LOW (ref 6.5–8.1)

## 2017-08-06 LAB — GLUCOSE, CAPILLARY
Glucose-Capillary: 102 mg/dL — ABNORMAL HIGH (ref 65–99)
Glucose-Capillary: 163 mg/dL — ABNORMAL HIGH (ref 65–99)
Glucose-Capillary: 98 mg/dL (ref 65–99)

## 2017-08-06 LAB — PROCALCITONIN: PROCALCITONIN: 0.5 ng/mL

## 2017-08-06 MED ORDER — POTASSIUM CHLORIDE CRYS ER 20 MEQ PO TBCR
40.0000 meq | EXTENDED_RELEASE_TABLET | ORAL | Status: AC
  Administered 2017-08-06 (×2): 40 meq via ORAL
  Filled 2017-08-06 (×2): qty 2

## 2017-08-06 MED ORDER — HALOPERIDOL LACTATE 5 MG/ML IJ SOLN
INTRAMUSCULAR | Status: AC
  Administered 2017-08-06: 5 mg
  Filled 2017-08-06: qty 1

## 2017-08-06 MED ORDER — CARVEDILOL 6.25 MG PO TABS
6.2500 mg | ORAL_TABLET | Freq: Two times a day (BID) | ORAL | Status: DC
  Administered 2017-08-06 – 2017-08-07 (×2): 6.25 mg via ORAL
  Filled 2017-08-06 (×2): qty 1

## 2017-08-06 MED ORDER — HALOPERIDOL LACTATE 5 MG/ML IJ SOLN: 5.0000 mg | Freq: Once | INTRAMUSCULAR | Status: AC

## 2017-08-06 NOTE — Progress Notes (Signed)
PROGRESS NOTE    Hunter Choi  ATF:[id number] DOB: Aug 15, 1927 DOA: 08/03/2017 PCP: Leeroy Cha, MD   Brief Narrative:  Hunter Choi is a 84 y.o. male with history of CAD status post CABG, status post TAVR, abdominal aortic aneurysm, chronic kidney disease, chronic systolic heart failure, rheumatoid arthritis, hypothyroidism, chronic anemia and thrombocytopenia was brought to the ER after patient was found to have fever of 103 F at home with confusion.  As per the patient's son patient has been having cough with productive sputum last 2 days with upper respiratory tract symptoms.  Patient also complained of some epigastric discomfort.  Denies any chest pain.  Over the last 2 days patient has been having increasing fever and since patient became more confused patient was brought to the ER.  Assessment & Plan:   Principal Problem:   SIRS (systemic inflammatory response syndrome) (HCC) Active Problems:   Rheumatoid arthritis (HCC)   S/P TAVR (transcatheter aortic valve replacement)   Chronic combined systolic and diastolic congestive heart failure (HCC)   CAD (coronary artery disease)   Essential hypertension   Acute encephalopathy   Acute on chronic combined systolic and diastolic CHF (congestive heart failure) ([place])  1. Fever without a source  Systemic Inflammatory Response Syndrome - Afebrile since initial fever on presentation.  CT chest abdomen pelvis without identified cause.  Head CT without identified cause.  Sons had a bit of a cough and he's had cough recently as well.  His confusion seems to be improved.  No meningismus.  No infectious cause identified.  Does have elevated inflammatory markers.  Could be related to RA below?  Temporal arteritis less likely with normal ESR and asx.  Would continue to watch for fevers as we taper his steroids and d/c antibiotics.  1. Remains afebrile today 2. Negative flu.  Negative RVP. 3. Follow blood cx NGTDx2 day.  Negative urine  cx. 4. Negative MRSA PCR 5. Will d/c vanc, will d/c zosyn. 6. ESR (wnl), CRP (elevated), follow procalcitonin (elevated, but downtrending)   2. Elevated Troponin:  Suspect demand ischemia in setting of above.  Initially on heparin gtt.  Cardiology following.  Cath 01/2017 and at that time, thought not candidate for PCI.   No plans for cath at this point per cards. 1. Appreciate cards recs.  Now signed off.  Suspect elevated trop related to above.  Will need outpatient follow up with Dr. Tamala Julian. 2. ASA, plavix, statin 3. Will resume beta blocker today  3. History of rheumatoid arthritis will hold methotrexate. 1. Started on hydrocortisone, will taper this gradually and resume home prednisone as long as he continues to improve  4. History of TAVR - on ASA daily . 5. History of CAD status post CABG.   Appreciate cardiology recommendations. 1. ASA, plavix, statin 2. Holding beta blocker for now (resume at d/c per cards).   6. Acute encephalopathy likely from fever  Possible Dementia: Pt A&Ox3 with me today.  On discussion with his son, he notes it's normal for him not to know the date.  Patient is extremely hard of hearing, which complicates this as well.  Continue to monitor. 1. Delirium precautions  2. CT head without notable cause  7. Chronic combined systolic and diastolic heart failure last EF measured in November 2018 was 35 to 40%.  Repeat echo with EF 30-35%. 1. Lasix restarted by cardiology.  Transition to PO today.  Still with some crackles today at bases, will ctm with PO lasix, but  improved edema on CXR.  2. Losartan restarted 3. Restart beta blocker 4. Will need f/u with cardiology as outpatient  Wt Readings from Last 3 Encounters:  08/06/17 68.4 kg (150 lb 12.7 oz)  04/19/17 68.4 kg (150 lb 12.8 oz)  02/17/17 70.1 kg (154 lb 8.7 oz)   8. History of hypertension - lasix as above.  Resume cozaar and coreg and amlodipine.   9. AKI on Chronic kidney disease stage III.   creatinine peaked to 1.46, now 1.51 after diuresis.  Follow with resuming home lasix dose.    10. Chronic anemia and Thrombocytopenia -  follow CBC with differential.  Labs pending today.  11. History of hypothyroidism on Synthroid.  12. Hyperlipidemia on statins.  13. Elevated Alk Phos: mild, follow  14. Aorto Bililiac Aneurysms: needs 3-6 month repeat imaging and vascular follow up  15. Bradycardia: follow EKG, noted overnight last night.  Improved.  Watch with resumption of coreg.  16. Hypokalemia: replete, follow   DVT prophylaxis: lovenox Code Status: full code Family Communication: son over phone 5/10 Disposition Plan: later today or tomorrow AM   Consultants:   cardiology  Procedures:  5/10 echo Study Conclusions  - Left ventricle: The cavity size was normal. Wall thickness was   increased in a pattern of moderate LVH. Systolic function was   moderately to severely reduced. The estimated ejection fraction   was in the range of 30% to 35%. Diffuse hypokinesis with regional   variation. The study is not technically sufficient to allow   evaluation of LV diastolic function. - Aortic valve: s/p TAVR. No obstruction. Trivial perivalvular   leak. Mean gradient (S): 6 mm Hg. Peak gradient (S): 13 mm Hg.   Valve area (VTI): 1.7 cm^2. Valve area (Vmax): 1.56 cm^2. Valve   area (Vmean): 1.54 cm^2. - Mitral valve: Mildly thickened leaflets . There was moderate   regurgitation. - Left atrium: Severely dilated. - Right ventricle: The cavity size was mildly dilated. Mildly   reduced systolic function. - Right atrium: The atrium was mildly dilated.  Impressions:  - Compared to a prior study in 2018, the LVEF is lower at 30-35%.  Antimicrobials:  Anti-infectives (From admission, onward)   Start     Dose/Rate Route Frequency Ordered Stop   08/04/17 0300  vancomycin (VANCOCIN) IVPB 1000 mg/200 mL premix  Status:  Discontinued     1,000 mg 200 mL/hr over 60 Minutes  Intravenous Every 24 hours 08/03/17 0356 08/04/17 1535   08/03/17 1000  piperacillin-tazobactam (ZOSYN) IVPB 3.375 g  Status:  Discontinued     3.375 g 12.5 mL/hr over 240 Minutes Intravenous Every 8 hours 08/03/17 0356 08/06/17 0814   08/03/17 0245  piperacillin-tazobactam (ZOSYN) IVPB 3.375 g     3.375 g 100 mL/hr over 30 Minutes Intravenous  Once 08/03/17 0242 08/03/17 0334   08/03/17 0245  vancomycin (VANCOCIN) IVPB 1000 mg/200 mL premix     1,000 mg 200 mL/hr over 60 Minutes Intravenous  Once 08/03/17 0242 08/03/17 0414     Subjective: Hard of hearing. Denies pain or complaint.   Objective: Vitals:   08/06/17 0400 08/06/17 0455 08/06/17 0500 08/06/17 0800  BP:  132/76  140/62  Pulse: (!) 57 69 73 67  Resp: 17 16 (!) 25 17  Temp:  98.5 F (36.9 C)    TempSrc:  Oral    SpO2: (!) 87% 95% 99% (!) 69%  Weight:      Height:  Intake/Output Summary (Last 24 hours) at 08/06/2017 0837 Last data filed at 08/06/2017 0518 Gross per 24 hour  Intake 546 ml  Output 2375 ml  Net -1829 ml   Filed Weights   08/04/17 0250 08/05/17 0246 08/06/17 0257  Weight: 68.5 kg (151 lb 0.2 oz) 68.3 kg (150 lb 9.2 oz) 68.4 kg (150 lb 12.7 oz)    Examination:  General: No acute distress. Cardiovascular: Heart sounds show a regular rate, and rhythm. No gallops or rubs. No murmurs. No JVD. Lungs: bibasilar crackles.  No increased WOB. Abdomen: Soft, nontender, nondistended with normal active bowel sounds. No masses. No hepatosplenomegaly. Neurological: Alert and oriented 3. Moves all extremities 4. Cranial nerves II through XII grossly intact. Skin: Warm and dry. No rashes or lesions. Extremities: No clubbing or cyanosis. No edema. Psychiatric: Mood and affect are normal. Insight and judgment are appropraite.   Data Reviewed: I have personally reviewed following labs and imaging studies  CBC: Recent Labs  Lab 08/03/17 0242 08/03/17 0623 08/04/17 0233 08/05/17 0617 08/06/17 0254    WBC 9.1 9.8 8.3 8.8 8.2  NEUTROABS 5.8 5.5  --   --   --   HGB 11.1* 10.1* 10.3* 10.3* 10.9*  HCT 36.1* 32.8* 34.0* 33.9* 35.4*  MCV 85.7 86.3 86.5 86.9 85.3  PLT 124* 99* 120* 144* 315   Basic Metabolic Panel: Recent Labs  Lab 08/03/17 0245 08/03/17 0623 08/04/17 0233 08/05/17 0617 08/06/17 0254  NA 134* 134* 137 136 138  K 4.4 4.2 4.4 3.8 3.1*  CL 97* 100* 104 100* 99*  CO2 _0 GLUCOSE 137* 100* 137* 89 119*  BUN 33* 34* 33* 31* 31*  CREATININE 1.38* 1.46* 1.31* 1.39* 1.51*  CALCIUM 8.6* 8.0* 7.9* 7.8* 7.9*  MG  --   --   --  2.0 1.8   GFR: Estimated Creatinine Clearance: 32.2 mL/min (A) (by C-G formula based on SCr of 1.51 mg/dL (H)). Liver Function Tests: Recent Labs  Lab 08/03/17 0245 08/03/17 0623 08/05/17 0617 08/06/17 0254  AST 35 32 31 29  ALT 43 35 29 25  ALKPHOS 170* 136* 104 102  BILITOT 0.8 0.9 0.8 0.8  PROT 6.5 5.9* 6.0* 6.4*  ALBUMIN 3.3* 2.9* 2.8* 2.9*   No results for input(s): LIPASE, AMYLASE in the last 168 hours. No results for input(s): AMMONIA in the last 168 hours. Coagulation Profile: Recent Labs  Lab 08/03/17 0623  INR 1.34   Cardiac Enzymes: Recent Labs  Lab 08/03/17 0623 08/03/17 1317 08/03/17 2010  TROPONINI 1.23* 0.92* 0.39*   BNP (last 3 results) No results for input(s): PROBNP in the last 8760 hours. HbA1C: No results for input(s): HGBA1C in the last 72 hours. CBG: Recent Labs  Lab 08/05/17 0018 08/05/17 0825 08/05/17 1634 08/05/17 2358 08/06/17 0754  GLUCAP 120* 96 157* 98 102*   Lipid Profile: No results for input(s): CHOL, HDL, LDLCALC, TRIG, CHOLHDL, LDLDIRECT in the last 72 hours. Thyroid Function Tests: Recent Labs    08/04/17 0233  TSH 1.603   Anemia Panel: No results for input(s): VITAMINB12, FOLATE, FERRITIN, TIBC, IRON, RETICCTPCT in the last 72 hours. Sepsis Labs: Recent Labs  Lab 08/03/17 0259 08/03/17 0623 08/03/17 2010 08/05/17 0617 08/06/17 0254  PROCALCITON  --  1.61   --  0.92 0.50  LATICACIDVEN 1.03 1.0 1.8  --   --     Recent Results (from the past 240 hour(s))  Blood Culture (routine x 2)     Status:  None (Preliminary result)   Collection Time: 08/03/17  2:45 AM  Result Value Ref Range Status   Specimen Description BLOOD RIGHT FOREARM  Final   Special Requests   Final    BOTTLES DRAWN AEROBIC AND ANAEROBIC Blood Culture adequate volume   Culture   Final    NO GROWTH 2 DAYS Performed at [facility], [address] [place], [place] [address]    Report Status PENDING  Incomplete  Blood Culture (routine x 2)     Status: None (Preliminary result)   Collection Time: 08/03/17  2:50 AM  Result Value Ref Range Status   Specimen Description BLOOD LEFT ANTECUBITAL  Final   Special Requests   Final    BOTTLES DRAWN AEROBIC AND ANAEROBIC Blood Culture adequate volume   Culture   Final    NO GROWTH 2 DAYS Performed at [facility], [place] [address] [place], [place] [address]    Report Status PENDING  Incomplete  Urine culture     Status: None   Collection Time: 08/03/17  3:52 AM  Result Value Ref Range Status   Specimen Description URINE, CATHETERIZED  Final   Special Requests NONE  Final   Culture   Final    NO GROWTH Performed at [facility], [place] [address] [place], [place] [address]    Report Status 08/04/2017 FINAL  Final  MRSA PCR Screening     Status: None   Collection Time: 08/03/17  6:56 PM  Result Value Ref Range Status   MRSA by PCR NEGATIVE NEGATIVE Final    Comment:        The GeneXpert MRSA Assay (FDA approved for NASAL specimens only), is one component of a comprehensive MRSA colonization surveillance program. It is not intended to diagnose MRSA infection nor to guide or monitor treatment for MRSA infections. Performed at [facility], [place] [address], [place], [place] [address]   Respiratory Panel by PCR     Status: None   Collection Time: 08/04/17  6:04 PM  Result Value Ref Range Status    Adenovirus NOT DETECTED NOT DETECTED Final   Coronavirus 229E NOT DETECTED NOT DETECTED Final   Coronavirus HKU1 NOT DETECTED NOT DETECTED Final   Coronavirus NL63 NOT DETECTED NOT DETECTED Final   Coronavirus OC43 NOT DETECTED NOT DETECTED Final   Metapneumovirus NOT DETECTED NOT DETECTED Final   Rhinovirus / Enterovirus NOT DETECTED NOT DETECTED Final   Influenza A NOT DETECTED NOT DETECTED Final   Influenza B NOT DETECTED NOT DETECTED Final   Parainfluenza Virus 1 NOT DETECTED NOT DETECTED Final   Parainfluenza Virus 2 NOT DETECTED NOT DETECTED Final   Parainfluenza Virus 3 NOT DETECTED NOT DETECTED Final   Parainfluenza Virus 4 NOT DETECTED NOT DETECTED Final   Respiratory Syncytial Virus NOT DETECTED NOT DETECTED Final   Bordetella pertussis NOT DETECTED NOT DETECTED Final   Chlamydophila pneumoniae NOT DETECTED NOT DETECTED Final   Mycoplasma pneumoniae NOT DETECTED NOT DETECTED Final    Comment: Performed at [facility], [place] [address], [place], [place] [address]         Radiology Studies: Dg Chest Port 1 View  Result Date: 08/06/2017 CLINICAL DATA:  Bibasilar crackles EXAM: PORTABLE CHEST 1 VIEW COMPARISON:  Portable exam 0539 hours compared to 08/03/2017 FINDINGS: Enlargement of cardiac silhouette post CABG and TAVR. Pulmonary vascular congestion. Atherosclerotic calcification aorta. Improved pulmonary edema. No pleural effusion or pneumothorax. Bones demineralized. IMPRESSION: Enlargement of  cardiac silhouette post CABG and TAVR. Improved pulmonary edema. Electronically Signed   By: Lavonia Dana M.D.   On: 08/06/2017 07:34        Scheduled Meds: . aspirin EC  81 mg Oral Daily  . atorvastatin  20 mg Oral Daily  . clopidogrel  75 mg Oral Daily  . enoxaparin (LOVENOX) injection  40 mg Subcutaneous Q24H  . fluticasone  1 spray Each Nare Daily  . folic acid  3 mg Oral Daily  . furosemide  40 mg Oral Daily  . hydrocortisone sod succinate (SOLU-CORTEF) inj  50 mg  Intravenous Daily  . lactose free nutrition  237 mL Oral Q24H  . levothyroxine  50 mcg Oral QAC breakfast  . pantoprazole  40 mg Oral Daily  . potassium chloride  40 mEq Oral Q4H  . [START ON 08/07/2017] predniSONE  5 mg Oral Daily   Continuous Infusions:    LOS: 3 days    Time spent: over 30 min    Fayrene Helper, MD [facility] Pager [phone]  If 7PM-7AM, please contact night-coverage [email or web address] Password [facility] 08/06/2017, 8:37 AM

## 2017-08-06 NOTE — Evaluation (Signed)
Physical Therapy Evaluation Patient Details Name: Hunter Choi MRN: [id number] DOB: 1927/07/28 Today's Date: 08/06/2017   History of Present Illness  Patient presented to the hosiptal with AMS and a fever. PMH : Stroke, HOH; GERD; CAD; aortic stenosisl CAAD, pulmonary embolism  Clinical Impression  Patient presents with limited endurance and stability on his feet. The patient was very hard of hearing. Therapy could not get a clear picture of his assistance level at home. Past notes indicate his children help him. He will require 24 hour assistance at discharge. If he has the proper assist level he would benefit from  Home health. If not he may require therapy at a SNF.     Follow Up Recommendations Home health PT;SNF    Equipment Recommendations  None recommended by PT    Recommendations for Other Services       Precautions / Restrictions Precautions Precautions: Fall Restrictions Weight Bearing Restrictions: No      Mobility  Bed Mobility Overal bed mobility: Needs Assistance Bed Mobility: Supine to Sit     Supine to sit: Min assist     General bed mobility comments: min a sit sit up in the bed  Transfers Overall transfer level: Needs assistance Equipment used: Rolling walker (2 wheeled) Transfers: Sit to/from Stand Sit to Stand: Min assist         General transfer comment: min a for strength and inital balance   Ambulation/Gait Ambulation/Gait assistance: Min guard Ambulation Distance (Feet): 15 Feet Assistive device: Rolling walker (2 wheeled)       General Gait Details: slow gait pattern. Patients knees flexed. He reported fatigue with gait.   Stairs            Wheelchair Mobility    Modified Rankin (Stroke Patients Only)       Balance Overall balance assessment: Needs assistance Sitting-balance support: No upper extremity supported Sitting balance-Leahy Scale: Good     Standing balance support: Bilateral upper extremity  supported Standing balance-Leahy Scale: Poor                               Pertinent Vitals/Pain Pain Assessment: No/denies pain    Home Living Family/patient expects to be discharged to:: Private residence Living Arrangements: Children Available Help at Discharge: Family;Available 24 hours/day Type of Home: House Home Access: Level entry     Home Layout: One level   Additional Comments: all info off past admission. Patient could not hear therapists querstions depsite therapist yelling     Prior Function Level of Independence: Independent with assistive device(s)         Comments: pt ambulates with RW/rollator per passt note      Hand Dominance        Extremity/Trunk Assessment   Upper Extremity Assessment Upper Extremity Assessment: Overall WFL for tasks assessed    Lower Extremity Assessment Lower Extremity Assessment: Generalized weakness       Communication   Communication: HOH  Cognition Arousal/Alertness: Awake/alert Behavior During Therapy: WFL for tasks assessed/performed Overall Cognitive Status: Within Functional Limits for tasks assessed                                        General Comments      Exercises     Assessment/Plan    PT Assessment Patient needs continued PT services  PT Problem List Decreased strength;Decreased activity tolerance;Decreased balance;Decreased mobility;Decreased safety awareness       PT Treatment Interventions      PT Goals (Current goals can be found in the Care Plan section)  Acute Rehab PT Goals Patient Stated Goal: to walk to the door tomorrow  PT Goal Formulation: With patient Time For Goal Achievement: 08/13/17 Potential to Achieve Goals: Good    Frequency Min 3X/week   Barriers to discharge   quastionable amount of help at home 2nd to patients difficulty answering questions.     Co-evaluation               AM-PAC PT "6 Clicks" Daily Activity  Outcome  Measure Difficulty turning over in bed (including adjusting bedclothes, sheets and blankets)?: A Lot Difficulty moving from lying on back to sitting on the side of the bed? : A Lot Difficulty sitting down on and standing up from a chair with arms (e.g., wheelchair, bedside commode, etc,.)?: A Lot Help needed moving to and from a bed to chair (including a wheelchair)?: A Lot Help needed walking in hospital room?: A Lot Help needed climbing 3-5 steps with a railing? : Total 6 Click Score: 11    End of Session Equipment Utilized During Treatment: Gait belt Activity Tolerance: Patient tolerated treatment well Patient left: in bed;with bed alarm set(Patient declined to get to chair ) Nurse Communication: Mobility status PT Visit Diagnosis: Unsteadiness on feet (R26.81);Other abnormalities of gait and mobility (R26.89)    Time: 1140-1155 PT Time Calculation (min) (ACUTE ONLY): 15 min   Charges:   PT Evaluation $PT Eval Moderate Complexity: 1 Mod     PT G Codes:        Dessie Coma PT DPT  08/06/2017, 12:55 PM

## 2017-08-07 LAB — COMPREHENSIVE METABOLIC PANEL
ALT: 25 U/L (ref 17–63)
AST: 31 U/L (ref 15–41)
Albumin: 2.9 g/dL — ABNORMAL LOW (ref 3.5–5.0)
Alkaline Phosphatase: 87 U/L (ref 38–126)
Anion gap: 9 (ref 5–15)
BUN: 29 mg/dL — AB (ref 6–20)
CHLORIDE: 104 mmol/L (ref 101–111)
CO2: 27 mmol/L (ref 22–32)
Calcium: 8.4 mg/dL — ABNORMAL LOW (ref 8.9–10.3)
Creatinine, Ser: 1.31 mg/dL — ABNORMAL HIGH (ref 0.61–1.24)
GFR, EST AFRICAN AMERICAN: 54 mL/min — AB (ref >60)
GFR, EST NON AFRICAN AMERICAN: 47 mL/min — AB (ref >60)
Glucose, Bld: 108 mg/dL — ABNORMAL HIGH (ref 65–99)
POTASSIUM: 3.8 mmol/L (ref 3.5–5.1)
SODIUM: 140 mmol/L (ref 135–145)
Total Bilirubin: 0.5 mg/dL (ref 0.3–1.2)
Total Protein: 6.2 g/dL — ABNORMAL LOW (ref 6.5–8.1)

## 2017-08-07 LAB — CBC
HCT: 35.9 % — ABNORMAL LOW (ref 39.0–52.0)
Hemoglobin: 11.3 g/dL — ABNORMAL LOW (ref 13.0–17.0)
MCH: 27.1 pg (ref 26.0–34.0)
MCHC: 31.5 g/dL (ref 30.0–36.0)
MCV: 86.1 fL (ref 78.0–100.0)
PLATELETS: 169 10*3/uL (ref 150–400)
RBC: 4.17 MIL/uL — ABNORMAL LOW (ref 4.22–5.81)
RDW: 17.7 % — AB (ref 11.5–15.5)
WBC: 10.2 10*3/uL (ref 4.0–10.5)

## 2017-08-07 LAB — MAGNESIUM: Magnesium: 1.7 mg/dL (ref 1.7–2.4)

## 2017-08-07 NOTE — Care Management Note (Signed)
Case Management Note  Patient Details  Name: MAAHIR HORST MRN: [id number] Date of Birth: 09-04-1927  Subjective/Objective:  From home with his son, who is there with patient for 24 hrs per day and he is his caregiver, [name] states they have worked with [name] in the past and would like to work with them for [facility] services.  NCM informed him that [name] is  Now [facility] and he said ok this is who he would like to work with for [facility] for disease management of CHF and HHPT.  Referral made to Tiffany with [facility] for [facility] and HHPT.  Soc will begin 24-48 hrs post dc.  [name] will be transporting patient home around lunch time.                   Action/Plan: DC home with [facility] services with [facility].  Expected Discharge Date:  08/07/17               Expected Discharge Plan:  Home w Home Health Services  In-House Referral:     Discharge planning Services  CM Consult  Post Acute Care Choice:  Home Health Choice offered to:  Adult Children  DME Arranged:    DME Agency:     HH Arranged:  RN, Disease Management, PT HH Agency:  [facility] ([facility]  Status of Service:  Completed, signed off  If discussed at [employer] of Stay Meetings, dates discussed:    Additional Comments:  [name], RN 08/07/2017, 9:31 AM

## 2017-08-07 NOTE — Care Management Important Message (Signed)
Important Message  Patient Details  Name: Hunter Choi MRN: [id number] Date of Birth: 08-17-1927   Medicare Important Message Given:  Yes    Darrell Leonhardt Stefan Church 08/07/2017, 3:45 PM

## 2017-08-07 NOTE — Discharge Summary (Signed)
Physician Discharge Summary  Hunter Choi TSV:[id number] DOB: Jul 08, 1927 DOA: 08/03/2017  PCP: Leeroy Cha, MD  Admit date: 08/03/2017 Discharge date: 08/07/2017  Time spent: 35 minutes  Recommendations for Outpatient Follow-up:  1. Follow up outpatient CBC/CMP 2. Follow for recurrent fevers.  If recurrent fevers, will need additional work up. 3. Follow up with cardiology.  Elevated trop 2/2 suspected demand ischemia here, repeat echo with EF 30-35%, lower than prior. 4. Required IV lasix for pulm edema, follow volume status in follow up 5. Will need follow up with vascular surgery and serial imaging for aorto bi iliac aneurysms 6. Had some bradycardia here, improved at discharge.  Follow on resumption of coreg at discharge.  Adjust as necessary.    Discharge Diagnoses:  Principal Problem:   SIRS (systemic inflammatory response syndrome) (HCC) Active Problems:   Rheumatoid arthritis (HCC)   S/P TAVR (transcatheter aortic valve replacement)   Chronic combined systolic and diastolic congestive heart failure (HCC)   CAD (coronary artery disease)   Essential hypertension   Acute encephalopathy   Acute on chronic combined systolic and diastolic CHF (congestive heart failure) ([place])   Discharge Condition: stable  Diet recommendation: heart healthy  Filed Weights   08/05/17 0246 08/06/17 0257 08/07/17 0347  Weight: 68.3 kg (150 lb 9.2 oz) 68.4 kg (150 lb 12.7 oz) 68.2 kg (150 lb 5.7 oz)    History of present illness:  Per PN Hunter Choi y.o.malewithhistory of CAD status post CABG, status post TAVR, abdominal aortic aneurysm, chronic kidney disease, chronic systolic heart failure, rheumatoid arthritis, hypothyroidism, chronic anemia and thrombocytopenia was brought to the ER after patient was found to have fever of 103 F at home with confusion. As per the patient's son patient has been having cough with productive sputum last 2 days with upper respiratory  tract symptoms. Patient also complained of some epigastric discomfort. Denies any chest pain. Over the last 2 days patient has been having increasing fever and since patient became more confused patient was brought to the ER.  He has been afebrile since admissions.  He improved on IV steroids and antibiotics.  Antibiotics were d/c'd after blood cx NGTD x 48 hours.  His steroids were tapered.  He had elevated tropoinin and was placed on heparin gtt.  Cardiology was consulted.  He was diuresed for pulmonary edema.  He was doing well on the day of discharge.    See below for additional details.   Hospital Course:  1. Fever without Hunter Choi source  Systemic Inflammatory Response Syndrome - Afebrile since initial fever on presentation.  CT chest abdomen pelvis without identified cause.  Head CT without identified cause.  Sons had Hunter Choi bit of Hunter Choi cough and he's had cough recently as well.  His confusion seems to be improved.  No meningismus.  No infectious cause identified.  Does have elevated inflammatory markers.  Could be related to RA below?  Temporal arteritis less likely with normal ESR and asx.  Would continue to watch for fevers as we taper his steroids and d/c antibiotics.  1. Remains afebrile today 2. Negative flu.  Negative RVP. 3. Follow blood cx NGTDx4 day.  Negative urine cx. 4. Negative MRSA PCR 5. Will d/c vanc, will d/c zosyn. 6. ESR (wnl), CRP (elevated), follow procalcitonin (elevated, but downtrending)  7. Follow up outpatient for recurrent fevers and w/u as appropriate  2. Elevated Troponin:  Suspect demand ischemia in setting of above.  Initially on heparin gtt.  Cardiology following.  Cath 01/2017 and at that time, thought not candidate for PCI.   No plans for cath at this point per cards. 1. Appreciate cards recs.  Now signed off.  Suspect elevated trop related to above.  Will need outpatient follow up with Dr. Tamala Julian. 2. ASA, plavix, statin, beta blocker restarted  3. History of  rheumatoid arthritis will hold methotrexate, resume at discharge. 1. Started on hydrocortisone, tapered to home prednisone dose at d/c  4. History of TAVR- on ASA daily . 5. History of CAD status post CABG.   Appreciate cardiology recommendations. 1. ASA, plavix, statin 2. Holding beta blocker for now (resume at d/c per cards).   6. Acute encephalopathy likely from fever  Possible Dementia: Pt Hunter Choi&Ox3 with me today.  On discussion with his son, he notes it's normal for him not to know the date.  Patient is extremely hard of hearing, which complicates this as well.  Continue to monitor. 1. Delirium precautions  2. CT head without notable cause  7. Chronic combined systolic and diastolic heart failure last EF measured in November 2018 was 35 to 40%.  Repeat echo with EF 30-35%. 1. Lasix restarted by cardiology.  Transition to PO today.  Continue PO lasix at discharge, resp status improved 2. Losartan restarted 3. Restart beta blocker 4. Will need f/u with cardiology as outpatient     Wt Readings from Last 3 Encounters:  08/06/17 68.4 kg (150 lb 12.7 oz)  04/19/17 68.4 kg (150 lb 12.8 oz)  02/17/17 70.1 kg (154 lb 8.7 oz)   8. History of hypertension - lasix as above.  Resume cozaar and coreg and amlodipine.   9. AKI on Chronic kidney disease stage III. creatinine peaked to 1.46, improved to 1.31 on discharge.  Follow outpatient.   10. Chronic anemia andThrombocytopenia -follow CBC with differential. Improved, follow  11. History of hypothyroidism on Synthroid.  12. Hyperlipidemia on statins.  13. Elevated Alk Phos: mild, follow  14. Aorto Bililiac Aneurysms: needs 3-6 month repeat imaging and vascular follow up  15. Bradycardia: improved, follow.  CTM with resumption of coreg post d/c.   16. Hypokalemia: replete, follow  Procedures: Echo 5/10 Study Conclusions  - Left ventricle: The cavity size was normal. Wall thickness was   increased in [name] pattern of  moderate LVH. Systolic function was   moderately to severely reduced. The estimated ejection fraction   was in the range of 30% to 35%. Diffuse hypokinesis with regional   variation. The study is not technically sufficient to allow   evaluation of LV diastolic function. - Aortic valve: s/p TAVR. No obstruction. Trivial perivalvular   leak. Mean gradient (S): 6 mm Hg. Peak gradient (S): 13 mm Hg.   Valve area (VTI): 1.7 cm^2. Valve area (Vmax): 1.56 cm^2. Valve   area (Vmean): 1.54 cm^2. - Mitral valve: Mildly thickened leaflets . There was moderate   regurgitation. - Left atrium: Severely dilated. - Right ventricle: The cavity size was mildly dilated. Mildly   reduced systolic function. - Right atrium: The atrium was mildly dilated.  Impressions:  - Compared to Jeweline Reif prior study in 2018, the LVEF is lower at 30-35%.  Consultations:  cardiology  Discharge Exam: Vitals:   08/07/17 0338 08/07/17 0400  BP: (!) 154/76   Pulse: 71   Resp: 20 18  Temp: 98.2 F (36.8 C)   SpO2: 98%    Hunter Choi&ox3.  Hard of hearing.   No complaints.  Discussed with son yesterday who preferred father go  home with him.  Noted they had good support at home.   General: No acute distress. Cardiovascular: Heart sounds show Maycen Degregory regular rate, and rhythm. No gallops or rubs. No murmurs. No JVD. Lungs: Clear to auscultation bilaterally with good air movement. No rales, rhonchi or wheezes. Abdomen: Soft, nontender, nondistended with normal active bowel sounds. No masses. No hepatosplenomegaly. Neurological: Alert and oriented 3. Moves all extremities 4. Cranial nerves II through XII grossly intact. Skin: Warm and dry. No rashes or lesions. Extremities: No clubbing or cyanosis. No edema. Pedal pulses 2+. Psychiatric: Mood and affect are normal. Insight and judgment are appropirate.  Discharge Instructions   Discharge Instructions    Ambulatory referral to Vascular Surgery   Complete by:  As directed    aorto  bi iliac aneurysm   Call MD for:  difficulty breathing, headache or visual disturbances   Complete by:  As directed    Call MD for:  extreme fatigue   Complete by:  As directed    Call MD for:  persistant dizziness or light-headedness   Complete by:  As directed    Call MD for:  persistant nausea and vomiting   Complete by:  As directed    Call MD for:  redness, tenderness, or signs of infection (pain, swelling, redness, odor or green/yellow discharge around incision site)   Complete by:  As directed    Call MD for:  severe uncontrolled pain   Complete by:  As directed    Call MD for:  temperature >100.4   Complete by:  As directed    Diet - low sodium heart healthy   Complete by:  As directed    Discharge instructions   Complete by:  As directed    You were seen for [place] fever.  You improved with antibiotics and high dose steroids.  We're not exactly sure what caused your fever, but our work up for infectious causes was negative and you have not had another fever since you've been here.  Please follow up with your PCP within [place]en Mascari few days.  If you have recurrent fevers, you likely need additional work up.  Your troponin was elevated here and cardiology was consulted.  We think this was because of stress on your heart from the fever.  Your heart failure was Dayln Tugwell little bit worse.  Please schedule follow up with cardiology.    Your imaging showed aorto bi iliac aneurysms.  These should be followed with serial imaging.  You should also see vascular in follow up.    Return for new, recurrent, or worsening symptoms.  Please ask your PCP to request records from this hospitalization so they know what was done and what the next steps will be.   Increase activity slowly   Complete by:  As directed      Allergies as of 08/07/2017      Reactions   Ciprofloxacin Other (See Comments)   REACTION: mild delirium      Medication List    TAKE these medications   acetaminophen 325 MG  tablet Commonly known as:  TYLENOL Take 2 tablets (650 mg total) by mouth every 6 (six) hours as needed for mild pain, moderate pain, fever or headache.   amLODipine 5 MG tablet Commonly known as:  NORVASC Take 5 mg by mouth daily.   aspirin 81 MG EC tablet Take 1 tablet (81 mg total) by mouth daily.   atorvastatin 20 MG tablet Commonly known as:  LIPITOR Take 20  mg by mouth daily.   carvedilol 6.25 MG tablet Commonly known as:  COREG Take 1 tablet (6.25 mg total) by mouth 2 (two) times daily with Rafiq Bucklin meal.   clopidogrel 75 MG tablet Commonly known as:  PLAVIX Take 1 tablet (75 mg total) by mouth daily.   fluticasone 50 MCG/ACT nasal spray Commonly known as:  FLONASE Place 1 spray into both nostrils daily.   folic acid 1 MG tablet Commonly known as:  FOLVITE Take 3 mg by mouth daily.   furosemide 20 MG tablet Commonly known as:  LASIX Take 40 mg by mouth daily.   lactose free nutrition Liqd Take 237 mLs daily by mouth.   levothyroxine 50 MCG tablet Commonly known as:  SYNTHROID, LEVOTHROID Take 50 mcg by mouth daily.   loratadine 10 MG tablet Commonly known as:  CLARITIN Take 10 mg by mouth daily.   losartan 25 MG tablet Commonly known as:  COZAAR Take 1 tablet (25 mg total) by mouth daily.   methotrexate 2.5 MG tablet Take 25 mg by mouth every Friday.   nitroGLYCERIN 0.4 MG SL tablet Commonly known as:  NITROSTAT PLACE 1 TABLET (0.4 MG TOTAL) UNDER THE TONGUE EVERY 5 MINUTES AS NEEDED FOR CHEST PAIN   pantoprazole 40 MG tablet Commonly known as:  PROTONIX Take 1 tablet (40 mg total) by mouth daily.   predniSONE 5 MG tablet Commonly known as:  DELTASONE Take 5 mg by mouth daily.      Allergies  Allergen Reactions  . Ciprofloxacin Other (See Comments)    REACTION: mild delirium   Follow-up Information    Leeroy Cha, MD Follow up.   Specialty:  Internal Medicine Why:  Call for follow up within [place] few days Contact information: [address] [place] [address] [phone]        Belva Crome, MD Follow up.   Specialty:  Cardiology Why:  Call for follow up  Contact information: [address] [place] [address] [phone]        [facility] Follow up.   Specialty:  Vascular Surgery Why:  Call to schedule appointment for aneurysms Contact information: [address] [place] [phone]           The results of significant diagnostics from this hospitalization (including imaging, microbiology, ancillary and laboratory) are listed below for reference.    Significant Diagnostic Studies: Ct Abdomen Pelvis Wo Contrast  Result Date: 08/03/2017 CLINICAL DATA:  Altered mental status, fever, cough, vomiting EXAM: CT CHEST, ABDOMEN AND PELVIS WITHOUT CONTRAST TECHNIQUE: Multidetector CT imaging of the chest, abdomen and pelvis was performed following the standard protocol without IV contrast. COMPARISON:  08/03/2017, 11/14/2014 FINDINGS: CT CHEST FINDINGS Cardiovascular: Extensive atherosclerosis of the thoracic aorta without significant aneurysm. No mediastinal hemorrhage or hematoma. Previous coronary bypass and TAVR noted. Heart is enlarged. No pericardial effusion. Mediastinum/Nodes: No enlarged mediastinal, hilar, or axillary lymph nodes. Thyroid gland, trachea, and esophagus demonstrate no significant findings. Lungs/Pleura: Subcentimeter peripheral and subpleural calcified granuloma in the right upper lobe, image 48 series 9. Bibasilar atelectasis noted. Respiratory motion artifact through the lower lobes. No significant focal pneumonia, collapse or consolidation. No interstitial process or edema. No other pleural abnormality, pleural effusion or pneumothorax. Trachea central airways appear patent. Musculoskeletal: Degenerative changes noted spine. No acute compression fracture. CT ABDOMEN PELVIS FINDINGS Hepatobiliary:  No focal liver abnormality is seen. Status post cholecystectomy. No biliary dilatation. Pancreas: Unremarkable.  No pancreatic ductal dilatation or surrounding inflammatory changes. Spleen: Normal in size without focal abnormality. Adrenals/Urinary Tract: Normal adrenal glands. No renal obstruction, obstructive uropathy, hydronephrosis, hydroureter. Bladder wall thickening noted appearing chronic with small bladder diverticula. Stomach/Bowel: Negative for bowel obstruction, significant dilatation, ileus, free air. No fluid collection or abscess. Scattered colonic diverticulosis. No acute inflammatory process. Vascular/Lymphatic: Aorto bi-iliac aneurysms again noted without evidence of acute rupture or retroperitoneal hemorrhage. Distal aortic aneurysm measures 5.3 cm in maximal diameter, image 85 series 7. Right common iliac aneurysm measures 5.9 cm and left common iliac aneurysm measures 4 cm. Right internal iliac aneurysm measures 4.8 cm. These all have enlarged compared 2016. Right common femoral aneurysm measures 2.1 cm. Reproductive: No significant finding by CT. Other: No abdominal wall hernia or abnormality. No abdominopelvic ascites. Musculoskeletal: Left hip arthroplasty creates artifact. Degenerative changes noted spine. No acute compression fracture IMPRESSION: Thoracic aortic atherosclerosis without aneurysm. Cardiomegaly with bibasilar atelectasis. No acute intrathoracic finding. Enlargement of the aorto bi-iliac aneurysms compared 2016 without evidence of acute rupture or retroperitoneal hemorrhage. Recommend followup by abdomen and pelvis CTA in 3-6 months, and vascular surgery referral/consultation if not already obtained. This recommendation follows ACR consensus guidelines: White Paper of the ACR Incidental Findings Committee II on Vascular Findings. J Am Coll Radiol 2013; 10:789-794. Interval cholecystectomy No other acute intra-abdominal or pelvic finding by noncontrast CT. Colonic diverticulosis  Chronic appearing bladder wall thickening with scattered diverticula Electronically Signed   By: Jerilynn Mages.  Shick M.D.   On: 08/03/2017 07:27   Ct Head Wo Contrast  Result Date: 08/03/2017 CLINICAL DATA:  Altered level of consciousness EXAM: CT HEAD WITHOUT CONTRAST TECHNIQUE: Contiguous axial images were obtained from the base of the skull through the vertex without intravenous contrast. COMPARISON:  02/12/2017 FINDINGS: Brain: No evidence of acute infarction, hemorrhage, hydrocephalus, extra-axial collection or mass lesion/mass effect. Small remote infarcts along the right more than left high cerebral convexities and bilateral cerebellum. Chronic small vessel ischemic type gliosis in the cerebral white matter. Generalized atrophy, stable. Vascular: Atherosclerotic calcification Skull: No acute or aggressive finding Sinuses/Orbits: Chronic bilateral mastoid opacification with negative nasopharynx. Chronic low calcifications and right cataract resection IMPRESSION: 1. No acute finding or change from 2018. 2. Chronic small vessel ischemic injury. 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 Electronically Signed   By: Monte Fantasia M.D.   On: 08/03/2017 07:16   Ct Chest Wo Contrast  Result Date: 08/03/2017 CLINICAL DATA:  Altered mental status, fever, cough, vomiting EXAM: CT CHEST, ABDOMEN AND PELVIS WITHOUT CONTRAST TECHNIQUE: Multidetector CT imaging of the chest, abdomen and pelvis was performed following the standard protocol without IV contrast. COMPARISON:  08/03/2017, 11/14/2014 FINDINGS: CT CHEST FINDINGS Cardiovascular: Extensive atherosclerosis of the thoracic aorta without significant aneurysm. No mediastinal hemorrhage or hematoma. Previous coronary bypass and TAVR noted. Heart is enlarged. No pericardial effusion. Mediastinum/Nodes: No enlarged mediastinal, hilar, or axillary lymph nodes. Thyroid gland, trachea, and esophagus demonstrate no significant findings.  Lungs/Pleura: Subcentimeter peripheral and subpleural calcified granuloma in the right upper lobe, image 48 series 9. Bibasilar atelectasis noted. Respiratory motion artifact through the lower lobes. No significant focal pneumonia, collapse or consolidation. No interstitial process or edema. No other pleural abnormality, pleural effusion or pneumothorax. Trachea central airways appear patent. Musculoskeletal: Degenerative changes noted spine. No acute compression fracture. CT ABDOMEN PELVIS FINDINGS Hepatobiliary: No focal liver abnormality is seen. Status post cholecystectomy. No biliary dilatation. Pancreas: Unremarkable. No pancreatic ductal dilatation or surrounding inflammatory changes. Spleen: Normal  in size without focal abnormality. Adrenals/Urinary Tract: Normal adrenal glands. No renal obstruction, obstructive uropathy, hydronephrosis, hydroureter. Bladder wall thickening noted appearing chronic with small bladder diverticula. Stomach/Bowel: Negative for bowel obstruction, significant dilatation, ileus, free air. No fluid collection or abscess. Scattered colonic diverticulosis. No acute inflammatory process. Vascular/Lymphatic: Aorto bi-iliac aneurysms again noted without evidence of acute rupture or retroperitoneal hemorrhage. Distal aortic aneurysm measures 5.3 cm in maximal diameter, image 85 series 7. Right common iliac aneurysm measures 5.9 cm and left common iliac aneurysm measures 4 cm. Right internal iliac aneurysm measures 4.8 cm. These all have enlarged compared 2016. Right common femoral aneurysm measures 2.1 cm. Reproductive: No significant finding by CT. Other: No abdominal wall hernia or abnormality. No abdominopelvic ascites. Musculoskeletal: Left hip arthroplasty creates artifact. Degenerative changes noted spine. No acute compression fracture IMPRESSION: Thoracic aortic atherosclerosis without aneurysm. Cardiomegaly with bibasilar atelectasis. No acute intrathoracic finding. Enlargement of  the aorto bi-iliac aneurysms compared 2016 without evidence of acute rupture or retroperitoneal hemorrhage. Recommend followup by abdomen and pelvis CTA in 3-6 months, and vascular surgery referral/consultation if not already obtained. This recommendation follows ACR consensus guidelines: White Paper of the ACR Incidental Findings Committee II on Vascular Findings. J Am Coll Radiol 2013; 10:789-794. Interval cholecystectomy No other acute intra-abdominal or pelvic finding by noncontrast CT. Colonic diverticulosis Chronic appearing bladder wall thickening with scattered diverticula Electronically Signed   By: Jerilynn Mages.  Shick M.D.   On: 08/03/2017 07:27   Dg Chest Port 1 View  Result Date: 08/06/2017 CLINICAL DATA:  Bibasilar crackles EXAM: PORTABLE CHEST 1 VIEW COMPARISON:  Portable exam 0539 hours compared to 08/03/2017 FINDINGS: Enlargement of cardiac silhouette post CABG and TAVR. Pulmonary vascular congestion. Atherosclerotic calcification aorta. Improved pulmonary edema. No pleural effusion or pneumothorax. Bones demineralized. IMPRESSION: Enlargement of cardiac silhouette post CABG and TAVR. Improved pulmonary edema. Electronically Signed   By: Lavonia Dana M.D.   On: 08/06/2017 07:34   Dg Chest Port 1 View  Result Date: 08/03/2017 CLINICAL DATA:  Altered mental status EXAM: PORTABLE CHEST 1 VIEW COMPARISON:  02/22/2017 FINDINGS: Stable cardiomegaly with tortuous atherosclerotic aorta. Interstitial pulmonary edema is noted. The patient is status post CABG with aortic valvular replacement. Colonic interposition over the liver shadow is identified. No acute pulmonary consolidation. There is minimal bibasilar atelectasis. No acute osseous abnormality. Degenerative change noted about both shoulders. IMPRESSION: Stable cardiomegaly with post CABG change. Tortuous atherosclerotic nonaneurysmal aorta. Interstitial edema is noted since prior. Electronically Signed   By: Ashley Royalty M.D.   On: 08/03/2017 03:25     Microbiology: Recent Results (from the past 240 hour(s))  Blood Culture (routine x 2)     Status: None (Preliminary result)   Collection Time: 08/03/17  2:45 AM  Result Value Ref Range Status   Specimen Description BLOOD RIGHT FOREARM  Final   Special Requests   Final    BOTTLES DRAWN AEROBIC AND ANAEROBIC Blood Culture adequate volume   Culture   Final    NO GROWTH 3 DAYS Performed at [facility], [address] [place],  [address]    Report Status PENDING  Incomplete  Blood Culture (routine x 2)     Status: None (Preliminary result)   Collection Time: 08/03/17  2:50 AM  Result Value Ref Range Status   Specimen Description BLOOD LEFT ANTECUBITAL  Final   Special Requests   Final    BOTTLES DRAWN AEROBIC AND ANAEROBIC Blood Culture adequate volume   Culture   Final  NO GROWTH 3 DAYS Performed at [facility], [place] [address] [place], [place] [address]    Report Status PENDING  Incomplete  Urine culture     Status: None   Collection Time: 08/03/17  3:52 AM  Result Value Ref Range Status   Specimen Description URINE, CATHETERIZED  Final   Special Requests NONE  Final   Culture   Final    NO GROWTH Performed at [facility], [place] [address], [place], [place] [address]    Report Status 08/04/2017 FINAL  Final  MRSA PCR Screening     Status: None   Collection Time: 08/03/17  6:56 PM  Result Value Ref Range Status   MRSA by PCR NEGATIVE NEGATIVE Final    Comment:        The GeneXpert MRSA Assay (FDA approved for NASAL specimens only), is one component of Estellar Cadena comprehensive MRSA colonization surveillance program. It is not intended to diagnose MRSA infection nor to guide or monitor treatment for MRSA infections. Performed at [facility], [place] [address] [place], [place] [address]   Respiratory Panel by PCR     Status: None   Collection Time: 08/04/17  6:04 PM  Result Value Ref Range Status   Adenovirus NOT DETECTED NOT DETECTED Final    Coronavirus 229E NOT DETECTED NOT DETECTED Final   Coronavirus HKU1 NOT DETECTED NOT DETECTED Final   Coronavirus NL63 NOT DETECTED NOT DETECTED Final   Coronavirus OC43 NOT DETECTED NOT DETECTED Final   Metapneumovirus NOT DETECTED NOT DETECTED Final   Rhinovirus / Enterovirus NOT DETECTED NOT DETECTED Final   Influenza Essence Merle NOT DETECTED NOT DETECTED Final   Influenza B NOT DETECTED NOT DETECTED Final   Parainfluenza Virus 1 NOT DETECTED NOT DETECTED Final   Parainfluenza Virus 2 NOT DETECTED NOT DETECTED Final   Parainfluenza Virus 3 NOT DETECTED NOT DETECTED Final   Parainfluenza Virus 4 NOT DETECTED NOT DETECTED Final   Respiratory Syncytial Virus NOT DETECTED NOT DETECTED Final   Bordetella pertussis NOT DETECTED NOT DETECTED Final   Chlamydophila pneumoniae NOT DETECTED NOT DETECTED Final   Mycoplasma pneumoniae NOT DETECTED NOT DETECTED Final    Comment: Performed at [facility], [place] [address] [place], [place] [address]     Labs: Basic Metabolic Panel: Recent Labs  Lab 08/03/17 [id number] 08/04/17 0233 08/05/17 0617 08/06/17 0254 08/07/17 0357  NA 134* 137 136 138 140  K 4.2 4.4 3.8 3.1* 3.8  CL 100* 104 100* 99* 104  CO2 _0 GLUCOSE 100* 137* 89 119* 108*  BUN 34* 33* 31* 31* 29*  CREATININE 1.46* 1.31* 1.39* 1.51* 1.31*  CALCIUM 8.0* 7.9* 7.8* 7.9* 8.4*  MG  --   --  2.0 1.8 1.7   Liver Function Tests: Recent Labs  Lab 08/03/17 0245 08/03/17 0623 08/05/17 0617 08/06/17 0254 08/07/17 0357  AST 35 32 _1 ALT 43 35 _2 ALKPHOS 170* 136* 104 102 87  BILITOT 0.8 0.9 0.8 0.8 0.5  PROT 6.5 5.9* 6.0* 6.4* 6.2*  ALBUMIN 3.3* 2.9* 2.8* 2.9* 2.9*   No results for input(s): LIPASE, AMYLASE in the last 168 hours. No results for input(s): AMMONIA in the last 168 hours. CBC: Recent Labs  Lab 08/03/17 0242 08/03/17 0623 08/04/17 0233 08/05/17 0617 08/06/17 0254 08/07/17 0357  WBC 9.1 9.8 8.3 8.8 8.2 10.2  NEUTROABS 5.8 5.5  --   --    --   --  HGB 11.1* 10.1* 10.3* 10.3* 10.9* 11.3*  HCT 36.1* 32.8* 34.0* 33.9* 35.4* 35.9*  MCV 85.7 86.3 86.5 86.9 85.3 86.1  PLT 124* 99* 120* 144* 167 169   Cardiac Enzymes: Recent Labs  Lab 08/03/17 0623 08/03/17 1317 08/03/17 2010  TROPONINI 1.23* 0.92* 0.39*   BNP: BNP (last 3 results) Recent Labs    02/13/17 0522 08/03/17 2010  BNP 1,875.3* 2,896.1*    ProBNP (last 3 results) No results for input(s): PROBNP in the last 8760 hours.  CBG: Recent Labs  Lab 08/05/17 0825 08/05/17 1634 08/05/17 2358 08/06/17 0754 08/06/17 1645  GLUCAP 96 157* 98 102* 163*       Signed:  Fayrene Helper MD.  [facility] 08/07/2017, 7:57 AM

## 2017-08-07 NOTE — Progress Notes (Signed)
Hunter Guitar, MD at bedside, pt AOx3, per MD, should DC to home today with [facility]

## 2017-08-08 ENCOUNTER — Emergency Department (HOSPITAL_COMMUNITY): Payer: Medicare Other

## 2017-08-08 ENCOUNTER — Other Ambulatory Visit: Payer: Self-pay

## 2017-08-08 ENCOUNTER — Encounter (HOSPITAL_COMMUNITY): Payer: Self-pay | Admitting: Emergency Medicine

## 2017-08-08 ENCOUNTER — Emergency Department (HOSPITAL_COMMUNITY)
Admission: EM | Admit: 2017-08-08 | Discharge: 2017-08-08 | Disposition: A | Discharge status: Other Acute Inpatient Hospital | Payer: Medicare Other | Attending: Emergency Medicine | Admitting: Emergency Medicine

## 2017-08-08 DIAGNOSIS — Z951 Presence of aortocoronary bypass graft: Secondary | ICD-10-CM | POA: Diagnosis not present

## 2017-08-08 DIAGNOSIS — Y9210 Unspecified residential institution as the place of occurrence of the external cause: Secondary | ICD-10-CM | POA: Insufficient documentation

## 2017-08-08 DIAGNOSIS — W06XXXA Fall from bed, initial encounter: Secondary | ICD-10-CM | POA: Insufficient documentation

## 2017-08-08 DIAGNOSIS — Y939 Activity, unspecified: Secondary | ICD-10-CM | POA: Insufficient documentation

## 2017-08-08 DIAGNOSIS — I2510 Atherosclerotic heart disease of native coronary artery without angina pectoris: Secondary | ICD-10-CM | POA: Insufficient documentation

## 2017-08-08 DIAGNOSIS — I110 Hypertensive heart disease with heart failure: Secondary | ICD-10-CM | POA: Insufficient documentation

## 2017-08-08 DIAGNOSIS — Z7982 Long term (current) use of aspirin: Secondary | ICD-10-CM | POA: Diagnosis not present

## 2017-08-08 DIAGNOSIS — R410 Disorientation, unspecified: Secondary | ICD-10-CM | POA: Diagnosis not present

## 2017-08-08 DIAGNOSIS — Z79899 Other long term (current) drug therapy: Secondary | ICD-10-CM | POA: Insufficient documentation

## 2017-08-08 DIAGNOSIS — Z7902 Long term (current) use of antithrombotics/antiplatelets: Secondary | ICD-10-CM | POA: Diagnosis not present

## 2017-08-08 DIAGNOSIS — Z96642 Presence of left artificial hip joint: Secondary | ICD-10-CM | POA: Insufficient documentation

## 2017-08-08 DIAGNOSIS — I252 Old myocardial infarction: Secondary | ICD-10-CM | POA: Insufficient documentation

## 2017-08-08 DIAGNOSIS — Z96653 Presence of artificial knee joint, bilateral: Secondary | ICD-10-CM | POA: Diagnosis not present

## 2017-08-08 DIAGNOSIS — E039 Hypothyroidism, unspecified: Secondary | ICD-10-CM | POA: Insufficient documentation

## 2017-08-08 DIAGNOSIS — Y999 Unspecified external cause status: Secondary | ICD-10-CM | POA: Diagnosis not present

## 2017-08-08 DIAGNOSIS — I5043 Acute on chronic combined systolic (congestive) and diastolic (congestive) heart failure: Secondary | ICD-10-CM | POA: Diagnosis not present

## 2017-08-08 DIAGNOSIS — R55 Syncope and collapse: Secondary | ICD-10-CM | POA: Diagnosis present

## 2017-08-08 LAB — TROPONIN I
Troponin I: 0.11 ng/mL (ref <0.03)
Troponin I: 0.13 ng/mL (ref <0.03)

## 2017-08-08 LAB — CBC WITH DIFFERENTIAL/PLATELET
ABS IMMATURE GRANULOCYTES: 0.6 10*3/uL — AB (ref 0.0–0.1)
Basophils Absolute: 0.1 10*3/uL (ref 0.0–0.1)
Basophils Relative: 1 %
Eosinophils Absolute: 0.3 10*3/uL (ref 0.0–0.7)
Eosinophils Relative: 2 %
HEMATOCRIT: 37.1 % — AB (ref 39.0–52.0)
Hemoglobin: 11.3 g/dL — ABNORMAL LOW (ref 13.0–17.0)
IMMATURE GRANULOCYTES: 5 %
LYMPHS ABS: 3.1 10*3/uL (ref 0.7–4.0)
LYMPHS PCT: 26 %
MCH: 26.3 pg (ref 26.0–34.0)
MCHC: 30.5 g/dL (ref 30.0–36.0)
MCV: 86.5 fL (ref 78.0–100.0)
MONOS PCT: 7 %
Monocytes Absolute: 0.8 10*3/uL (ref 0.1–1.0)
NEUTROS ABS: 7.1 10*3/uL (ref 1.7–7.7)
NEUTROS PCT: 59 %
Platelets: 154 10*3/uL (ref 150–400)
RBC: 4.29 MIL/uL (ref 4.22–5.81)
RDW: 17.5 % — AB (ref 11.5–15.5)
WBC: 11.9 10*3/uL — AB (ref 4.0–10.5)

## 2017-08-08 LAB — URINALYSIS, COMPLETE (UACMP) WITH MICROSCOPIC
Bilirubin Urine: NEGATIVE
GLUCOSE, UA: NEGATIVE mg/dL
Ketones, ur: NEGATIVE mg/dL
LEUKOCYTES UA: NEGATIVE
NITRITE: NEGATIVE
PH: 5 (ref 5.0–8.0)
PROTEIN: 100 mg/dL — AB
Specific Gravity, Urine: 1.016 (ref 1.005–1.030)

## 2017-08-08 LAB — COMPREHENSIVE METABOLIC PANEL
ALT: 27 U/L (ref 17–63)
ANION GAP: 11 (ref 5–15)
AST: 36 U/L (ref 15–41)
Albumin: 3.3 g/dL — ABNORMAL LOW (ref 3.5–5.0)
Alkaline Phosphatase: 87 U/L (ref 38–126)
BUN: 36 mg/dL — ABNORMAL HIGH (ref 6–20)
CHLORIDE: 100 mmol/L — AB (ref 101–111)
CO2: 30 mmol/L (ref 22–32)
Calcium: 8.7 mg/dL — ABNORMAL LOW (ref 8.9–10.3)
Creatinine, Ser: 1.6 mg/dL — ABNORMAL HIGH (ref 0.61–1.24)
GFR, EST AFRICAN AMERICAN: 43 mL/min — AB (ref >60)
GFR, EST NON AFRICAN AMERICAN: 37 mL/min — AB (ref >60)
Glucose, Bld: 104 mg/dL — ABNORMAL HIGH (ref 65–99)
POTASSIUM: 3.7 mmol/L (ref 3.5–5.1)
SODIUM: 141 mmol/L (ref 135–145)
Total Bilirubin: 0.8 mg/dL (ref 0.3–1.2)
Total Protein: 6.5 g/dL (ref 6.5–8.1)

## 2017-08-08 LAB — CULTURE, BLOOD (ROUTINE X 2)
Culture: NO GROWTH
Culture: NO GROWTH
SPECIAL REQUESTS: ADEQUATE
Special Requests: ADEQUATE

## 2017-08-08 LAB — GLUCOSE, CAPILLARY
GLUCOSE-CAPILLARY: 113 mg/dL — AB (ref 65–99)
Glucose-Capillary: 96 mg/dL (ref 65–99)

## 2017-08-08 NOTE — ED Notes (Signed)
Call social work at [phone] when Dr. Criss Alvine has discharged patient. Have social work fax AVS to [employer].

## 2017-08-08 NOTE — ED Triage Notes (Signed)
Pt BIB EMS from home. Family in home report hearing the pt fall from another room, but fall unwitnessed. Denies LOC, head or neck pain. A&O to self only, while usually A&O to self, location, DOB. Pt admitted here over the weekend for "fever" and family states he's been altered since returning home. No obvious injuries or pain complaints. VSS.

## 2017-08-08 NOTE — NC FL2 (Addendum)
[place] MEDICAID FL2 LEVEL OF CARE SCREENING TOOL     IDENTIFICATION  Patient Name: Hunter Choi Birthdate: Oct 13, 1927 Sex: male Admission Date (Current Location): 08/08/2017  [facility] and [place] Number:  Producer, television/film/video and Address:  [facility], [address], [place], [place] [address]      Provider Number: [id number]  Attending Physician Name and Address:  Pricilla Loveless, MD  Relative Name and Phone Number:       Current Level of Care: Hospital Recommended Level of Care: Skilled Nursing Facility Prior Approval Number:    Date Approved/Denied:   PASRR Number:   [id number]   Discharge Plan:  SNF    Current Diagnoses: Patient Active Problem List   Diagnosis Date Noted  . Acute on chronic combined systolic and diastolic CHF (congestive heart failure) (HCC)   . SIRS (systemic inflammatory response syndrome) (HCC) 08/03/2017  . CAD (coronary artery disease) 08/03/2017  . Essential hypertension 08/03/2017  . Acute encephalopathy 08/03/2017  . Acute kidney injury (nontraumatic) (HCC)   . Elevated troponin   . NSTEMI (non-ST elevated myocardial infarction) (HCC) 02/12/2017  . Sepsis due to urinary tract infection (HCC) 10/02/2015  . Hard of hearing   . Pressure ulcer 01/28/2015  . Chronic cholecystitis 01/27/2015  . Chronic combined systolic and diastolic congestive heart failure (HCC) 01/27/2015  . Hyponatremia 11/21/2014  . Elevated LFTs 11/14/2014  . Anemia 05/23/2014  . Microscopic hematuria 05/23/2014  . Thrombocytopenia (HCC) 02/21/2014  . Malnutrition of moderate degree (HCC) 02/21/2014  . Occlusion and stenosis of carotid artery without mention of cerebral infarction 09/04/2013  . Aftercare following surgery of the circulatory system, NEC 09/04/2013  . AAA (abdominal aortic aneurysm) without rupture (HCC) 03/06/2013  . 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 Coronary artery disease involving  coronary bypass graft of native heart with angina pectoris (HCC)   . Hypertensive heart disease with CHF (HCC)   . GERD (gastroesophageal reflux disease)   . Thyroid disease   . Embolism involving retinal artery   . BPH (benign prostatic hypertrophy)   . Iliac artery aneurysm (HCC)   . Rheumatoid arthritis (HCC) 04/22/2010  . PULMONARY FUNCTION TESTS, ABNORMAL 04/22/2010    Orientation RESPIRATION BLADDER Height & Weight        Normal Incontinent Weight: 150 lb 5.7 oz (68.2 kg) Height:  5\' 7"  (170.2 cm)  BEHAVIORAL SYMPTOMS/MOOD NEUROLOGICAL BOWEL NUTRITION STATUS      Incontinent Diet(please see discharge. )  AMBULATORY STATUS COMMUNICATION OF NEEDS Skin   Extensive Assist Verbally Normal                       Personal Care Assistance Level of Assistance  Bathing, Feeding, Dressing Bathing Assistance: Maximum assistance Feeding assistance: Limited assistance Dressing Assistance: Maximum assistance     Functional Limitations Info  Sight, Hearing, Speech Sight Info: Adequate Hearing Info: Adequate Speech Info: Adequate    SPECIAL CARE FACTORS FREQUENCY  PT (By licensed PT), OT (By licensed OT)     PT Frequency: 5 times a week  OT Frequency: 5 times a week             Contractures Contractures Info: Not present    Additional Factors Info  Code Status, Allergies Code Status Info: Prior Allergies Info:  Ciprofloxacin           Current Medications (08/08/2017):  This is the current hospital active medication list No current facility-administered medications  for this encounter.    Current Outpatient Medications  Medication Sig Dispense Refill  . acetaminophen (TYLENOL) 325 MG tablet Take 2 tablets (650 mg total) by mouth every 6 (six) hours as needed for mild pain, moderate pain, fever or headache.    Marland Kitchen amLODipine (NORVASC) 5 MG tablet Take 5 mg by mouth daily.    Marland Kitchen aspirin EC 81 MG EC tablet Take 1 tablet (81 mg total) by mouth daily.    Marland Kitchen atorvastatin  (LIPITOR) 20 MG tablet Take 20 mg by mouth daily.    . carvedilol (COREG) 6.25 MG tablet Take 1 tablet (6.25 mg total) by mouth 2 (two) times daily with a meal. 60 tablet 3  . clopidogrel (PLAVIX) 75 MG tablet Take 1 tablet (75 mg total) by mouth daily. 90 tablet 3  . fluticasone (FLONASE) 50 MCG/ACT nasal spray Place 1 spray into both nostrils daily.     . folic acid (FOLVITE) 1 MG tablet Take 3 mg by mouth daily.    . furosemide (LASIX) 20 MG tablet Take 40 mg by mouth daily.    Marland Kitchen lactose free nutrition (BOOST) LIQD Take 237 mLs daily by mouth.    . levothyroxine (SYNTHROID, LEVOTHROID) 50 MCG tablet Take 50 mcg by mouth daily.  0  . loratadine (CLARITIN) 10 MG tablet Take 10 mg by mouth daily.  1  . losartan (COZAAR) 25 MG tablet Take 1 tablet (25 mg total) by mouth daily. 90 tablet 3  . methotrexate 2.5 MG tablet Take 25 mg by mouth every Friday.     . nitroGLYCERIN (NITROSTAT) 0.4 MG SL tablet PLACE 1 TABLET (0.4 MG TOTAL) UNDER THE TONGUE EVERY 5 MINUTES AS NEEDED FOR CHEST PAIN 25 tablet 5  . pantoprazole (PROTONIX) 40 MG tablet Take 1 tablet (40 mg total) by mouth daily. 90 tablet 3  . predniSONE (DELTASONE) 5 MG tablet Take 5 mg by mouth daily.  3     Discharge Medications: Please see discharge summary for a list of discharge medications.  Relevant Imaging Results:  Relevant Lab Results:   Additional Information [id number].  Robb Matar, LCSWA

## 2017-08-08 NOTE — ED Notes (Signed)
Social work informed pt ready to be discharged

## 2017-08-08 NOTE — Discharge Planning (Signed)
Clinical Social Work is seeking post-discharge placement for this patient at the following level of care: SNF   2

## 2017-08-08 NOTE — ED Provider Notes (Signed)
[facility] EMERGENCY DEPARTMENT Provider Note   CSN: [id number] Arrival date & time: 08/08/17  4098  LEVEL 5 CAVEAT - ALTERED MENTAL STATUS   History   Chief Complaint Chief Complaint  Patient presents with  . Fall  . Near Syncope    HPI MALACHY COLEMAN is a 84 y.o. male.  HPI  84 year old male with multiple medical problems including AAA, aortic stenosis status post valve replacement, coronary disease and CHF presents with altered mental status and fall.  History is taken from EMS at the bedside but is otherwise limited because of the patient's mental status.  The patient was discharged from the hospital yesterday for altered mental status with no apparent source.  Today the family heard a fall and went in to find him laying next to his bed.  They presumed he rolled off the bed.  He was awake and alert but somewhat confused when he was seen by family.  The patient also has had generalized weakness according to family.  No further history is obtainable because the patient is confused.  Past Medical History:  Diagnosis Date  . AAA (abdominal aortic aneurysm) (HCC)    Dr. Ashley Royalty  . Aortic stenosis, severe    a. s/p TAVR  . Blind left eye   . BPH (benign prostatic hypertrophy)    Dr. Retta Diones  . CAD (coronary artery disease) of bypass graft    Chronic occlusion of saphenous vein graft to RCA  . Coronary artery disease    Left main disease and severe 3-vessel CAD  . Diastolic heart failure (HCC)   . Embolism involving retinal artery   . GERD (gastroesophageal reflux disease)   . Hard of hearing   . Hypertension   . Hypothyroidism   . Iliac artery aneurysm (HCC)    Dr. Edilia Bo  . PAF (paroxysmal atrial fibrillation) (HCC)   . 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 12/2012: mm Stephannie Peters XT transcatheter heart valve placed via transapical approach  . Stroke [facility])    mini stroke effective his eye left    Patient Active  Problem List   Diagnosis Date Noted  . Acute on chronic combined systolic and diastolic CHF (congestive heart failure) (HCC)   . SIRS (systemic inflammatory response syndrome) (HCC) 08/03/2017  . CAD (coronary artery disease) 08/03/2017  . Essential hypertension 08/03/2017  . Acute encephalopathy 08/03/2017  . Acute kidney injury (nontraumatic) (HCC)   . Elevated troponin   . NSTEMI (non-ST elevated myocardial infarction) (HCC) 02/12/2017  . Sepsis due to urinary tract infection (HCC) 10/02/2015  . Hard of hearing   . Pressure ulcer 01/28/2015  . Chronic cholecystitis 01/27/2015  . Chronic combined systolic and diastolic congestive heart failure (HCC) 01/27/2015  . Hyponatremia 11/21/2014  . Elevated LFTs 11/14/2014  . Anemia 05/23/2014  . Microscopic hematuria 05/23/2014  . Thrombocytopenia (HCC) 02/21/2014  . Malnutrition of moderate degree (HCC) 02/21/2014  . Occlusion and stenosis of carotid artery without mention of cerebral infarction 09/04/2013  . Aftercare following surgery of the circulatory system, NEC 09/04/2013  . AAA (abdominal aortic aneurysm) without rupture (HCC) 03/06/2013  . 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 Coronary artery disease involving coronary bypass graft of native heart with angina pectoris (HCC)   . Hypertensive heart disease with CHF (HCC)   . GERD (gastroesophageal reflux disease)   . Thyroid disease   . Embolism involving retinal artery   . BPH (benign prostatic hypertrophy)   .  Iliac artery aneurysm (HCC)   . Rheumatoid arthritis (HCC) 04/22/2010  . PULMONARY FUNCTION TESTS, ABNORMAL 04/22/2010    Past Surgical History:  Procedure Laterality Date  . BIOPSY PROSTATE  2005  . BUNIONECTOMY WITH HAMMERTOE RECONSTRUCTION Bilateral   . CARDIAC CATHETERIZATION    . CARDIAC CATHETERIZATION N/A 05/07/2015   Procedure: Right Heart Cath and Coronary/Graft Angiography;  Surgeon: Lyn Records, MD;  Location: [facility] INVASIVE CV LAB;   Service: Cardiovascular;  Laterality: N/A;  . CAROTID ENDARTERECTOMY Right Jan. 2, 1997  . CATARACT EXTRACTION W/ INTRAOCULAR LENS IMPLANT Right   . CHOLECYSTECTOMY N/A 01/28/2015   Procedure: LAPAROSCOPIC CHOLECYSTECTOMY WITH INTRAOPERATIVE CHOLANGIOGRAM;  Surgeon: Gaynelle Adu, MD;  Location: [facility] OR;  Service: General;  Laterality: N/A;  . COLONOSCOPY    . CORONARY ARTERY BYPASS GRAFT  01/1998   Dr. Sheliah Plane; LIMA to LAD, SVG to D1, SVG to LCx, SVG to RCA, open saphenous vein harvest via right lower extremity  . HARVEST BONE GRAFT  1982   FOR THE  ANKLE  . HEMIARTHROPLASTY HIP Left    AFTER HIP FX  . INTRAOPERATIVE TRANSESOPHAGEAL ECHOCARDIOGRAM N/A 01/15/2013   Procedure: INTRAOPERATIVE TRANSESOPHAGEAL ECHOCARDIOGRAM;  Surgeon: [name], MD;  Location: [facility] OR;  Service: Open Heart Surgery;  Laterality: N/A;  . JOINT REPLACEMENT    . LEFT AND RIGHT HEART CATHETERIZATION WITH CORONARY ANGIOGRAM N/A 12/20/2012   Procedure: LEFT AND RIGHT HEART CATHETERIZATION WITH CORONARY ANGIOGRAM;  Surgeon: Lesleigh Noe, MD;  Location: [facility] CATH LAB;  Service: Cardiovascular;  Laterality: N/A;  . LEFT HEART CATH AND CORS/GRAFTS ANGIOGRAPHY N/A 02/14/2017   Procedure: LEFT HEART CATH AND CORS/GRAFTS ANGIOGRAPHY;  Surgeon: Marykay Lex, MD;  Location: [facility] INVASIVE CV LAB;  Service: Cardiovascular;  Laterality: N/A;  . SKIN GRAFTS  1968  . TOTAL HIP ARTHROPLASTY Left 2007  . TOTAL KNEE ARTHROPLASTY  1992-2002   right-left  . TRANSCATHETER AORTIC VALVE REPLACEMENT, TRANSAPICAL N/A 01/15/2013   Procedure: TRANSCATHETER AORTIC VALVE REPLACEMENT, TRANSAPICAL;  Surgeon: [name], MD;  Location: [facility] OR;  Service: Open Heart Surgery;  Laterality: N/A;  . TRANSURETHRAL RESECTION OF PROSTATE  2012        Home Medications    Prior to Admission medications   Medication Sig Start Date End Date Taking? Authorizing Provider  acetaminophen (TYLENOL) 325 MG tablet Take 2 tablets (650 mg total)  by mouth every 6 (six) hours as needed for mild pain, moderate pain, fever or headache. 02/03/15   Hongalgi, Maximino Greenland, MD  amLODipine (NORVASC) 5 MG tablet Take 5 mg by mouth daily.    [provider]  aspirin EC 81 MG EC tablet Take 1 tablet (81 mg total) by mouth daily. 01/21/13   Ardelle Balls, PA-C  atorvastatin (LIPITOR) 20 MG tablet Take 20 mg by mouth daily.    [provider]  carvedilol (COREG) 6.25 MG tablet Take 1 tablet (6.25 mg total) by mouth 2 (two) times daily with a meal. 10/21/15   Lyn Records, MD  clopidogrel (PLAVIX) 75 MG tablet Take 1 tablet (75 mg total) by mouth daily. 04/19/17   Leone Brand, NP  fluticasone (FLONASE) 50 MCG/ACT nasal spray Place 1 spray into both nostrils daily.  06/29/16   [provider]  folic acid (FOLVITE) 1 MG tablet Take 3 mg by mouth daily.    [provider]  furosemide (LASIX) 20 MG tablet Take 40 mg by mouth daily.    [provider]  lactose free nutrition (BOOST) LIQD Take 237 mLs daily by mouth.    [provider]  levothyroxine (SYNTHROID, LEVOTHROID) 50 MCG tablet Take 50 mcg by mouth daily. 11/06/14   [provider]  loratadine (CLARITIN) 10 MG tablet Take 10 mg by mouth daily. 06/29/16   [provider]  losartan (COZAAR) 25 MG tablet Take 1 tablet (25 mg total) by mouth daily. 04/19/17   Leone Brand, NP  methotrexate 2.5 MG tablet Take 25 mg by mouth every Friday.     [provider]  nitroGLYCERIN (NITROSTAT) 0.4 MG SL tablet PLACE 1 TABLET (0.4 MG TOTAL) UNDER THE TONGUE EVERY 5 MINUTES AS NEEDED FOR CHEST PAIN 12/21/16   Lyn Records, MD  pantoprazole (PROTONIX) 40 MG tablet Take 1 tablet (40 mg total) by mouth daily. 02/18/17   Azalee Course, PA  predniSONE (DELTASONE) 5 MG tablet Take 5 mg by mouth daily. 04/07/15   [provider]    Family History Family History  Problem Relation Age of Onset  . Heart attack Mother   . Heart disease  Mother        Before age 52  . Hyperlipidemia Mother   . Hypertension Mother   . Hypertension Son   . Heart disease Father        AAA-   . Hyperlipidemia Father   . Heart attack Father   . Hypertension Father   . Heart disease Brother        Before age 83  . Hyperlipidemia Brother   . Heart attack Brother   . Hypertension Brother     Social History Social History   Tobacco Use  . Smoking status: Former Smoker    Packs/day: 0.50    Years: 37.00    Pack years: 18.50    Types: Cigarettes    Last attempt to quit: 03/29/1963    Years since quitting: 54.4  . Smokeless tobacco: Former Neurosurgeon    Types: Chew  Substance Use Topics  . Alcohol use: No  . Drug use: No     Allergies   Ciprofloxacin   Review of Systems Review of Systems  Unable to perform ROS: Mental status change     Physical Exam Updated Vital Signs BP (!) 106/41   Pulse 61   Temp 98.4 F (36.9 C) (Rectal)   Resp 11   Ht 5\' 7"  (1.702 m)   Wt 68.2 kg (150 lb 5.7 oz)   SpO2 97%   BMI 23.55 kg/m   Physical Exam  Constitutional: He appears well-developed and well-nourished.  HENT:  Head: Normocephalic and atraumatic.  Right Ear: External ear normal.  Left Ear: External ear normal.  Nose: Nose normal.  Eyes: Pupils are equal, round, and reactive to light. EOM are normal. Right eye exhibits no discharge. Left eye exhibits no discharge.  Neck: Neck supple.  Towel roll around neck No tenderness on exam  Cardiovascular: Normal rate, regular rhythm and normal heart sounds.  Pulmonary/Chest: Effort normal and breath sounds normal.  Abdominal: Soft. He exhibits no distension. There is no tenderness.  Musculoskeletal: He exhibits no edema.       Right hip: He exhibits normal range of motion and no tenderness.       Left hip: He exhibits normal range of motion and no tenderness.       Cervical back: He exhibits no tenderness.       Thoracic back: He exhibits no tenderness.  Lumbar back: He exhibits  no tenderness.  Neurological: He is alert. He is disoriented.  Awake, alert to self and knows birthday. Answers some questions inappropriately (I.e. Tells me birthday instead of answering question). No facial droop. 5/5 strength in all 4 extremities  Skin: Skin is warm and dry. He is not diaphoretic.  Nursing note and vitals reviewed.    ED Treatments / Results  Labs (all labs ordered are listed, but only abnormal results are displayed) Labs Reviewed  COMPREHENSIVE METABOLIC PANEL - Abnormal; Notable for the following components:      Result Value   Chloride 100 (*)    Glucose, Bld 104 (*)    BUN 36 (*)    Creatinine, Ser 1.60 (*)    Calcium 8.7 (*)    Albumin 3.3 (*)    GFR calc non Af Amer 37 (*)    GFR calc Af Amer 43 (*)    All other components within normal limits  CBC WITH DIFFERENTIAL/PLATELET - Abnormal; Notable for the following components:   WBC 11.9 (*)    Hemoglobin 11.3 (*)    HCT 37.1 (*)    RDW 17.5 (*)    Abs Immature Granulocytes 0.6 (*)    All other components within normal limits  URINALYSIS, COMPLETE (UACMP) WITH MICROSCOPIC - Abnormal; Notable for the following components:   APPearance HAZY (*)    Hgb urine dipstick MODERATE (*)    Protein, ur 100 (*)    Bacteria, UA FEW (*)    All other components within normal limits  TROPONIN I - Abnormal; Notable for the following components:   Troponin I 0.11 (*)    All other components within normal limits  TROPONIN I - Abnormal; Notable for the following components:   Troponin I 0.13 (*)    All other components within normal limits  CBG MONITORING, ED    EKG EKG Interpretation  Date/Time:  Tuesday Aug 08 2017 08:30:21 EDT Ventricular Rate:  87 PR Interval:    QRS Duration: 176 QT Interval:  412 QTC Calculation: 496 R Axis:   -30 Text Interpretation:  Sinus rhythm Ventricular bigeminy Prolonged PR interval Probable left atrial enlargement Left bundle branch block more frequent PVCs compared to Aug 05 2017 Confirmed by Pricilla Loveless [phone]) on 08/08/2017 8:47:19 AM   Radiology Dg Chest 2 View  Result Date: 08/08/2017 CLINICAL DATA:  84 year old male status post unwitnessed fall. Altered mental status. EXAM: CHEST - 2 VIEW COMPARISON:  08/06/2017 and earlier. FINDINGS: Portable AP semi upright view at 0850 hours. Prior CABG and cardiac valve replacement. Calcified aortic atherosclerosis. Stable cardiac size and mediastinal contours. Stable somewhat low lung volumes. Mild bilateral increased pulmonary interstitial opacity with no pneumothorax, pulmonary edema, pleural effusion or confluent opacity. Stable visualized osseous structures. Negative visible bowel gas pattern. IMPRESSION: No acute cardiopulmonary abnormality or acute traumatic injury identified. Electronically Signed   By: Odessa Fleming M.D.   On: 08/08/2017 09:18   Ct Head Wo Contrast  Result Date: 08/08/2017 CLINICAL DATA:  84 year old male status post unwitnessed fall. Altered level of consciousness. EXAM: CT HEAD WITHOUT CONTRAST CT CERVICAL SPINE WITHOUT CONTRAST TECHNIQUE: Multidetector CT imaging of the head and cervical spine was performed following the standard protocol without intravenous contrast. Multiplanar CT image reconstructions of the cervical spine were also generated. COMPARISON:  Head CT without contrast 08/03/2017, and earlier. Cervical spine radiographs 07/08/2010. FINDINGS: CT HEAD FINDINGS Brain: Stable cerebral volume. Stable gray-white matter differentiation throughout the brain. Confluent bilateral cerebral  white matter hypodensity. Heterogeneous hypodensity in the left deep gray matter nuclei. Small chronic cortical encephalomalacia in the right parietal lobe (series 3, image 25), and right frontal lobe image 27. Multiple chronic infarcts in the cerebellum, larger on the right. Streak artifact suspected today through the anterior right superior temporal lobe on sagittal image 13. No midline shift, ventriculomegaly, mass  effect, evidence of mass lesion, intracranial hemorrhage or evidence of cortically based acute infarction. Vascular: Calcified atherosclerosis at the skull base. No suspicious intracranial vascular hyperdensity. Skull: Stable.  No acute osseous abnormality identified. Sinuses/Orbits: Bilateral mastoid effusions appears stable while opacity in the left tympanic cavity has regressed since 08/03/2017. Paranasal sinuses remain clear. Other: Stable orbits soft tissues. No scalp hematoma identified. Stable scalp soft tissues. CT CERVICAL SPINE FINDINGS Alignment: Largely stable since 2012. Chronic mild anterolisthesis of C3 on C4, C7 on T1. Chronic straightening of cervical lordosis and mild retrolisthesis of C4 on C5 and C5 on C6. Increased leftward flexion of the neck. Bilateral posterior element alignment is within normal limits. Skull base and vertebrae: Visualized skull base is intact. No atlanto-occipital dissociation. No cervical spine fracture identified. Soft tissues and spinal canal: No prevertebral fluid or swelling. No visible canal hematoma. Chronic surgical clips in the right neck are stable since 2012. Summary in the right carotid space. Calcified carotid artery atherosclerosis. Disc levels: Severe chronic disc and endplate degeneration at C4-C5 through C6-C7. Moderate and severe left side cervical facet degeneration, maximal at C3-C4. Multifactorial degenerative cervical spinal stenosis is mild to moderate at C4-C5 and C5-C6. Upper chest: Visible upper thoracic levels appear intact. Prior CABG. Negative lung apices aside from scarring. No superior mediastinal lymphadenopathy. IMPRESSION: 1. No acute traumatic injury identified in the head or cervical spine. 2. Stable non contrast CT appearance of the brain with advanced chronic small and medium-sized vessel ischemia. 3. Advanced chronic cervical spine degeneration. Mild to moderate degenerative spinal stenosis at C4-C5 and C5-C6. 4. Bilateral mastoid  effusions. Improved left tympanic cavity pneumatization. Electronically Signed   By: Odessa Fleming M.D.   On: 08/08/2017 09:16   Ct Cervical Spine Wo Contrast  Result Date: 08/08/2017 CLINICAL DATA:  84 year old male status post unwitnessed fall. Altered level of consciousness. EXAM: CT HEAD WITHOUT CONTRAST CT CERVICAL SPINE WITHOUT CONTRAST TECHNIQUE: Multidetector CT imaging of the head and cervical spine was performed following the standard protocol without intravenous contrast. Multiplanar CT image reconstructions of the cervical spine were also generated. COMPARISON:  Head CT without contrast 08/03/2017, and earlier. Cervical spine radiographs 07/08/2010. FINDINGS: CT HEAD FINDINGS Brain: Stable cerebral volume. Stable gray-white matter differentiation throughout the brain. Confluent bilateral cerebral white matter hypodensity. Heterogeneous hypodensity in the left deep gray matter nuclei. Small chronic cortical encephalomalacia in the right parietal lobe (series 3, image 25), and right frontal lobe image 27. Multiple chronic infarcts in the cerebellum, larger on the right. Streak artifact suspected today through the anterior right superior temporal lobe on sagittal image 13. No midline shift, ventriculomegaly, mass effect, evidence of mass lesion, intracranial hemorrhage or evidence of cortically based acute infarction. Vascular: Calcified atherosclerosis at the skull base. No suspicious intracranial vascular hyperdensity. Skull: Stable.  No acute osseous abnormality identified. Sinuses/Orbits: Bilateral mastoid effusions appears stable while opacity in the left tympanic cavity has regressed since 08/03/2017. Paranasal sinuses remain clear. Other: Stable orbits soft tissues. No scalp hematoma identified. Stable scalp soft tissues. CT CERVICAL SPINE FINDINGS Alignment: Largely stable since 2012. Chronic mild anterolisthesis of C3 on C4, C7 on T1.  Chronic straightening of cervical lordosis and mild retrolisthesis  of C4 on C5 and C5 on C6. Increased leftward flexion of the neck. Bilateral posterior element alignment is within normal limits. Skull base and vertebrae: Visualized skull base is intact. No atlanto-occipital dissociation. No cervical spine fracture identified. Soft tissues and spinal canal: No prevertebral fluid or swelling. No visible canal hematoma. Chronic surgical clips in the right neck are stable since 2012. Summary in the right carotid space. Calcified carotid artery atherosclerosis. Disc levels: Severe chronic disc and endplate degeneration at C4-C5 through C6-C7. Moderate and severe left side cervical facet degeneration, maximal at C3-C4. Multifactorial degenerative cervical spinal stenosis is mild to moderate at C4-C5 and C5-C6. Upper chest: Visible upper thoracic levels appear intact. Prior CABG. Negative lung apices aside from scarring. No superior mediastinal lymphadenopathy. IMPRESSION: 1. No acute traumatic injury identified in the head or cervical spine. 2. Stable non contrast CT appearance of the brain with advanced chronic small and medium-sized vessel ischemia. 3. Advanced chronic cervical spine degeneration. Mild to moderate degenerative spinal stenosis at C4-C5 and C5-C6. 4. Bilateral mastoid effusions. Improved left tympanic cavity pneumatization. Electronically Signed   By: Odessa Fleming M.D.   On: 08/08/2017 09:16    Procedures Procedures (including critical care time)  Medications Ordered in ED Medications - No data to display   Initial Impression / Assessment and Plan / ED Course  I have reviewed the triage vital signs and the nursing notes.  Pertinent labs & imaging results that were available during my care of the patient were reviewed by me and considered in my medical decision making (see chart for details).     Patient is awake, alert but confused.  I talked to son over the phone who has seen the patient in the ED but I did not get to talk to him in person.  However over  the phone the he would prefer to him to be admitted to a nursing home.  I talked with case management and social work and given that he was just admitted for multiple days son states the patient always gets confused like this after a hospital stay and gets delirious.  However because of this delirium he is unable to care for him at home.  He is able to get transferred to [facility] health care.  His labs are unremarkable compared to baseline.  CT head without acute abnormality.  He has no focal neuro deficits besides confusion which is an acute on chronic issue for him.  I do not think he would necessarily benefit from coming into the hospital besides help and assistance.  We discussed this with son.  His troponin is lower than when he was discharged.  Given mild elevation it was repeated and is essentially the same, was 0.11 and now is 0.13.  I discussed with Dr. Anne Fu, who advises this is essentially negligible and would treat as unchanged and follow-up as needed as an outpatient.  Discharge to nursing home with return precautions.  Final Clinical Impressions(s) / ED Diagnoses   Final diagnoses:  Delirium    ED Discharge Orders    None       Pricilla Loveless, MD 08/08/17 1609

## 2017-08-08 NOTE — Progress Notes (Addendum)
2:38pm- CSW received text from Clydie Braun with [facility] informing CSW that they can take pt. Auth number is [id number]. Pt will be going to room 125A. RN to call report to [phone]. CSW aware that MD is waiting for one more test to come back before discharging pt. CSW will handoff to evening CSW as needed. CSW attempted to update pt's son [name] however no answer and left VM at this time.  1:09pm- CSW spoke with karen from [facility] and was informed that she will start auth for pt to be admitted to the facility at this time. CSW updated son [name] and he is aware of auth being started with [employer] at thi.s time. CSW will continue to follow for discharge needs   CSW attempted to speak with pt at bedside. CSW unable to arouse pt with touching of arm or yelling name. CSW  has spoken with pt's son [name] and was informed that he thought that he could care for pt at home however pt is still falling and needing more help than son and family can provide at this time. CSW has spoken with son and son is intrested in getting pt into [facility]. CSW has sent over information to [facility] as well as other facilities to see who can meet pt's need at this time. CSW will continue to follow for further needs.    Claude Manges Aniketh Huberty, MSW, LCSW-A Emergency Department Clinical Social Worker [phone]

## 2017-08-08 NOTE — Progress Notes (Signed)
CSW faxed pt's AVS to Clydie Braun at [facility].   Report Number: [phone]  Pt is going to room 125A. Authorization number is [id number].  CSW called [facility] to schedule transportation to [facility].   CSW called pt's son to update him.   Montine Circle, [facility]  [phone]

## 2017-09-06 ENCOUNTER — Ambulatory Visit: Payer: Medicare Other | Admitting: Vascular Surgery

## 2017-09-17 NOTE — Progress Notes (Signed)
Cardiology Office Note    Date:  09/18/2017   ID:  Hunter Choi, DOB 1927-09-15, MRN [id number]  PCP:  [name], MD  Cardiologist: Lesleigh Noe, MD   Chief Complaint  Patient presents with  . Coronary Artery Disease  . Cardiac Valve Problem    History of Present Illness:  Hunter Choi is a 84 y.o. male presents for follow-up of transaortic valve replacement for aortic stenosis, CAD with bypass graft occlusion, hearing loss, newly developed systolic heart failure with most recent LVEF less than 30% and chronic kidney disease.  Plan is doing well.  He denies angina.  He denies shortness of breath.  Was hospitalized in May because of sepsis.  He was incoherent.  He had elevated troponins.  Elevated troponins were felt secondary to demand ischemia.  He has had no angina since resolution of sepsis.   Past Medical History:  Diagnosis Date  . AAA (abdominal aortic aneurysm) (HCC)    Dr. Ashley Royalty  . Aortic stenosis, severe    a. s/p TAVR  . Blind left eye   . BPH (benign prostatic hypertrophy)    Dr. Retta Diones  . CAD (coronary artery disease) of bypass graft    Chronic occlusion of saphenous vein graft to RCA  . Coronary artery disease    Left main disease and severe 3-vessel CAD  . Diastolic heart failure (HCC)   . Embolism involving retinal artery   . GERD (gastroesophageal reflux disease)   . Hard of hearing   . Hypertension   . Hypothyroidism   . Iliac artery aneurysm (HCC)    Dr. Edilia Bo  . PAF (paroxysmal atrial fibrillation) (HCC)   . 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 12/2012: mm Stephannie Peters XT transcatheter heart valve placed via transapical approach  . Stroke [facility])    mini stroke effective his eye left    Past Surgical History:  Procedure Laterality Date  . BIOPSY PROSTATE  2005  . BUNIONECTOMY WITH HAMMERTOE RECONSTRUCTION Bilateral   . CARDIAC CATHETERIZATION    . CARDIAC CATHETERIZATION N/A  05/07/2015   Procedure: Right Heart Cath and Coronary/Graft Angiography;  Surgeon: Lyn Records, MD;  Location: [facility] INVASIVE CV LAB;  Service: Cardiovascular;  Laterality: N/A;  . CAROTID ENDARTERECTOMY Right Jan. 2, 1997  . CATARACT EXTRACTION W/ INTRAOCULAR LENS IMPLANT Right   . CHOLECYSTECTOMY N/A 01/28/2015   Procedure: LAPAROSCOPIC CHOLECYSTECTOMY WITH INTRAOPERATIVE CHOLANGIOGRAM;  Surgeon: Gaynelle Adu, MD;  Location: [facility] OR;  Service: General;  Laterality: N/A;  . COLONOSCOPY    . CORONARY ARTERY BYPASS GRAFT  01/1998   Dr. Sheliah Plane; LIMA to LAD, SVG to D1, SVG to LCx, SVG to RCA, open saphenous vein harvest via right lower extremity  . HARVEST BONE GRAFT  1982   FOR THE  ANKLE  . HEMIARTHROPLASTY HIP Left    AFTER HIP FX  . INTRAOPERATIVE TRANSESOPHAGEAL ECHOCARDIOGRAM N/A 01/15/2013   Procedure: INTRAOPERATIVE TRANSESOPHAGEAL ECHOCARDIOGRAM;  Surgeon: [name], MD;  Location: [facility] OR;  Service: Open Heart Surgery;  Laterality: N/A;  . JOINT REPLACEMENT    . LEFT AND RIGHT HEART CATHETERIZATION WITH CORONARY ANGIOGRAM N/A 12/20/2012   Procedure: LEFT AND RIGHT HEART CATHETERIZATION WITH CORONARY ANGIOGRAM;  Surgeon: Lesleigh Noe, MD;  Location: [facility] CATH LAB;  Service: Cardiovascular;  Laterality: N/A;  . LEFT HEART CATH AND CORS/GRAFTS ANGIOGRAPHY N/A 02/14/2017   Procedure: LEFT HEART CATH AND CORS/GRAFTS ANGIOGRAPHY;  Surgeon: Bryan Lemma  W, MD;  Location: [facility] INVASIVE CV LAB;  Service: Cardiovascular;  Laterality: N/A;  . SKIN GRAFTS  1968  . TOTAL HIP ARTHROPLASTY Left 2007  . TOTAL KNEE ARTHROPLASTY  1992-2002   right-left  . TRANSCATHETER AORTIC VALVE REPLACEMENT, TRANSAPICAL N/A 01/15/2013   Procedure: TRANSCATHETER AORTIC VALVE REPLACEMENT, TRANSAPICAL;  Surgeon: [name], MD;  Location: [facility] OR;  Service: Open Heart Surgery;  Laterality: N/A;  . TRANSURETHRAL RESECTION OF PROSTATE  2012    Current Medications: Outpatient Medications Prior to Visit    Medication Sig Dispense Refill  . acetaminophen (TYLENOL) 325 MG tablet Take 2 tablets (650 mg total) by mouth every 6 (six) hours as needed for mild pain, moderate pain, fever or headache.    Marland Kitchen amLODipine (NORVASC) 5 MG tablet Take 5 mg by mouth daily.    Marland Kitchen aspirin EC 81 MG EC tablet Take 1 tablet (81 mg total) by mouth daily.    Marland Kitchen atorvastatin (LIPITOR) 20 MG tablet Take 20 mg by mouth daily.    . carvedilol (COREG) 6.25 MG tablet Take 1 tablet (6.25 mg total) by mouth 2 (two) times daily with a meal. 60 tablet 3  . clopidogrel (PLAVIX) 75 MG tablet Take 1 tablet (75 mg total) by mouth daily. 90 tablet 3  . fluticasone (FLONASE) 50 MCG/ACT nasal spray Place 1 spray into both nostrils daily.     . folic acid (FOLVITE) 1 MG tablet Take 3 mg by mouth daily.    . furosemide (LASIX) 20 MG tablet Take 40 mg by mouth daily.    Marland Kitchen lactose free nutrition (BOOST) LIQD Take 237 mLs daily by mouth.    . levothyroxine (SYNTHROID, LEVOTHROID) 50 MCG tablet Take 50 mcg by mouth daily.  0  . loratadine (CLARITIN) 10 MG tablet Take 10 mg by mouth daily.  1  . losartan (COZAAR) 25 MG tablet Take 1 tablet (25 mg total) by mouth daily. 90 tablet 3  . methotrexate 2.5 MG tablet Take 25 mg by mouth every Friday.     . nitroGLYCERIN (NITROSTAT) 0.4 MG SL tablet PLACE 1 TABLET (0.4 MG TOTAL) UNDER THE TONGUE EVERY 5 MINUTES AS NEEDED FOR CHEST PAIN 25 tablet 5  . pantoprazole (PROTONIX) 40 MG tablet Take 1 tablet (40 mg total) by mouth daily. 90 tablet 3  . predniSONE (DELTASONE) 5 MG tablet Take 5 mg by mouth daily.  3   No facility-administered medications prior to visit.      Allergies:   Ciprofloxacin   Social History   Socioeconomic History  . Marital status: Married    Spouse name: Not on file  . Number of children: Not on file  . Years of education: Not on file  . Highest education level: Not on file  Occupational History  . Occupation: Retired  Engineer, production  . Financial resource strain: Not on  file  . Food insecurity:    Worry: Not on file    Inability: Not on file  . Transportation needs:    Medical: Not on file    Non-medical: Not on file  Tobacco Use  . Smoking status: Former Smoker    Packs/day: 0.50    Years: 37.00    Pack years: 18.50    Types: Cigarettes    Last attempt to quit: 03/29/1963    Years since quitting: 54.5  . Smokeless tobacco: Former Neurosurgeon    Types: Chew  Substance and Sexual Activity  . Alcohol use: No  . Drug use:  No  . Sexual activity: Not on file  Lifestyle  . Physical activity:    Days per week: Not on file    Minutes per session: Not on file  . Stress: Not on file  Relationships  . Social connections:    Talks on phone: Not on file    Gets together: Not on file    Attends religious service: Not on file    Active member of club or organization: Not on file    Attends meetings of clubs or organizations: Not on file    Relationship status: Not on file  Other Topics Concern  . Not on file  Social History Narrative  . Not on file     Family History:  The patient's family history includes Heart attack in his brother, father, and mother; Heart disease in his brother, father, and mother; Hyperlipidemia in his brother, father, and mother; Hypertension in his brother, father, mother, and son.   ROS:   Please see the history of present illness.    He has had some cough.  Appetite is increasing.  Still very hard of hearing. All other systems reviewed and are negative.   PHYSICAL EXAM:   VS:  BP (!) 108/54   Pulse 67   Ht 5\' 5"  (1.651 m)   Wt 146 lb (66.2 kg)   BMI 24.30 kg/m    GEN: Well nourished, well developed, in no acute distress  HEENT: normal  Neck: no JVD, carotid bruits, or masses Cardiac: RRR; over 6 systolic murmur, rubs, or gallops,no edema. Respiratory:  clear to auscultation bilaterally, normal work of breathing GI: soft, nontender, nondistended, + BS MS: no deformity or atrophy  Skin: warm and dry, no rash Neuro:   Alert and Oriented x 3, Strength and sensation are intact Psych: euthymic mood, full affect  Wt Readings from Last 3 Encounters:  09/18/17 146 lb (66.2 kg)  08/08/17 150 lb 5.7 oz (68.2 kg)  08/07/17 150 lb 5.7 oz (68.2 kg)      Studies/Labs Reviewed:   EKG:  EKG   Not repeated  Recent Labs: 08/03/2017: B Natriuretic Peptide 2,896.1 08/04/2017: TSH 1.603 08/07/2017: Magnesium 1.7 08/08/2017: ALT 27; BUN 36; Creatinine, Ser 1.60; Hemoglobin 11.3; Platelets 154; Potassium 3.7; Sodium 141   Lipid Panel    Component Value Date/Time   CHOL 84 02/21/2014 0210   TRIG 93 02/21/2014 0210   HDL 27 (L) 02/21/2014 0210   CHOLHDL 3.1 02/21/2014 0210   VLDL 19 02/21/2014 0210   LDLCALC 38 02/21/2014 0210    Additional studies/ records that were reviewed today include:  None    ASSESSMENT:    1. S/P TAVR (transcatheter aortic valve replacement)   2. Essential hypertension   3. Coronary artery disease involving coronary bypass graft of native heart with angina pectoris (HCC)   4. Chronic combined systolic and diastolic congestive heart failure (HCC)   5. AAA (abdominal aortic aneurysm) without rupture (HCC)      PLAN:  In order of problems listed above:  1. TAVR valve status is stable. 2. Blood pressures under excellent control. 3. Elevated troponin in the setting of sepsis was felt to be related to demand ischemia. 4. No evidence of volume overload. 5. Not discussed or evaluated at this time.  Clinical follow-up in 6 months with a team member.  Medication Adjustments/Labs and Tests Ordered: Current medicines are reviewed at length with the patient today.  Concerns regarding medicines are outlined above.  Medication changes, Labs and  Tests ordered today are listed in the Patient Instructions below. Patient Instructions  Medication Instructions:  Your physician recommends that you continue on your current medications as directed. Please refer to the Current Medication list  given to you today.  Labwork: None  Testing/Procedures: None  Follow-Up: Your physician recommends that you schedule a follow-up appointment in: 6 months with Norma Fredrickson, NP or Nada Boozer, NP.  Your physician wants you to follow-up in: 1 year with Dr. Katrinka Blazing.  You will receive a reminder letter in the mail two months in advance. If you don't receive a letter, please call our office to schedule the follow-up appointment.    Any Other Special Instructions Will Be Listed Below (If Applicable).     If you need a refill on your cardiac medications before your next appointment, please call your pharmacy.      Signed, Lesleigh Noe, MD  09/18/2017 6:06 PM    [facility] [address] [place], [place], [place]  [address] Phone: [phone]; Fax: [phone]

## 2017-09-18 ENCOUNTER — Ambulatory Visit: Payer: Medicare Other | Admitting: Interventional Cardiology

## 2017-09-18 ENCOUNTER — Encounter: Payer: Self-pay | Admitting: Interventional Cardiology

## 2017-09-18 VITALS — BP 108/54 | HR 67 | Ht 65.0 in | Wt 146.0 lb

## 2017-09-18 DIAGNOSIS — I25709 Atherosclerosis of coronary artery bypass graft(s), unspecified, with unspecified angina pectoris: Secondary | ICD-10-CM | POA: Diagnosis not present

## 2017-09-18 DIAGNOSIS — I714 Abdominal aortic aneurysm, without rupture, unspecified: Secondary | ICD-10-CM

## 2017-09-18 DIAGNOSIS — I5042 Chronic combined systolic (congestive) and diastolic (congestive) heart failure: Secondary | ICD-10-CM

## 2017-09-18 DIAGNOSIS — Z952 Presence of prosthetic heart valve: Secondary | ICD-10-CM

## 2017-09-18 DIAGNOSIS — I10 Essential (primary) hypertension: Secondary | ICD-10-CM

## 2017-09-18 NOTE — Patient Instructions (Signed)
Medication Instructions:  Your physician recommends that you continue on your current medications as directed. Please refer to the Current Medication list given to you today.  Labwork: None  Testing/Procedures: None  Follow-Up: Your physician recommends that you schedule a follow-up appointment in: 6 months with Norma Fredrickson, NP or Nada Boozer, NP.  Your physician wants you to follow-up in: 1 year with Dr. Katrinka Blazing.  You will receive a reminder letter in the mail two months in advance. If you don't receive a letter, please call our office to schedule the follow-up appointment.    Any Other Special Instructions Will Be Listed Below (If Applicable).     If you need a refill on your cardiac medications before your next appointment, please call your pharmacy.

## 2017-12-09 ENCOUNTER — Inpatient Hospital Stay (HOSPITAL_COMMUNITY)
Admission: EM | Admit: 2017-12-09 | Discharge: 2017-12-15 | DRG: 291 | Disposition: A | Discharge status: Skilled Nursing Facility | Payer: Medicare Other | Attending: Internal Medicine | Admitting: Internal Medicine

## 2017-12-09 ENCOUNTER — Encounter (HOSPITAL_COMMUNITY): Payer: Self-pay | Admitting: *Deleted

## 2017-12-09 ENCOUNTER — Other Ambulatory Visit: Payer: Self-pay

## 2017-12-09 ENCOUNTER — Emergency Department (HOSPITAL_COMMUNITY): Payer: Medicare Other

## 2017-12-09 DIAGNOSIS — Z8673 Personal history of transient ischemic attack (TIA), and cerebral infarction without residual deficits: Secondary | ICD-10-CM

## 2017-12-09 DIAGNOSIS — Z87891 Personal history of nicotine dependence: Secondary | ICD-10-CM

## 2017-12-09 DIAGNOSIS — Z23 Encounter for immunization: Secondary | ICD-10-CM | POA: Diagnosis present

## 2017-12-09 DIAGNOSIS — N183 Chronic kidney disease, stage 3 unspecified: Secondary | ICD-10-CM | POA: Diagnosis present

## 2017-12-09 DIAGNOSIS — I2510 Atherosclerotic heart disease of native coronary artery without angina pectoris: Secondary | ICD-10-CM | POA: Diagnosis present

## 2017-12-09 DIAGNOSIS — Z7982 Long term (current) use of aspirin: Secondary | ICD-10-CM

## 2017-12-09 DIAGNOSIS — I480 Paroxysmal atrial fibrillation: Secondary | ICD-10-CM | POA: Diagnosis present

## 2017-12-09 DIAGNOSIS — H9190 Unspecified hearing loss, unspecified ear: Secondary | ICD-10-CM | POA: Diagnosis present

## 2017-12-09 DIAGNOSIS — I252 Old myocardial infarction: Secondary | ICD-10-CM | POA: Diagnosis not present

## 2017-12-09 DIAGNOSIS — I130 Hypertensive heart and chronic kidney disease with heart failure and stage 1 through stage 4 chronic kidney disease, or unspecified chronic kidney disease: Secondary | ICD-10-CM | POA: Diagnosis present

## 2017-12-09 DIAGNOSIS — Z96653 Presence of artificial knee joint, bilateral: Secondary | ICD-10-CM | POA: Diagnosis present

## 2017-12-09 DIAGNOSIS — Z7902 Long term (current) use of antithrombotics/antiplatelets: Secondary | ICD-10-CM

## 2017-12-09 DIAGNOSIS — Z96642 Presence of left artificial hip joint: Secondary | ICD-10-CM | POA: Diagnosis present

## 2017-12-09 DIAGNOSIS — I5043 Acute on chronic combined systolic (congestive) and diastolic (congestive) heart failure: Secondary | ICD-10-CM | POA: Diagnosis present

## 2017-12-09 DIAGNOSIS — N179 Acute kidney failure, unspecified: Secondary | ICD-10-CM | POA: Diagnosis not present

## 2017-12-09 DIAGNOSIS — E039 Hypothyroidism, unspecified: Secondary | ICD-10-CM | POA: Diagnosis present

## 2017-12-09 DIAGNOSIS — E871 Hypo-osmolality and hyponatremia: Secondary | ICD-10-CM | POA: Diagnosis present

## 2017-12-09 DIAGNOSIS — N189 Chronic kidney disease, unspecified: Secondary | ICD-10-CM | POA: Diagnosis not present

## 2017-12-09 DIAGNOSIS — D649 Anemia, unspecified: Secondary | ICD-10-CM | POA: Diagnosis present

## 2017-12-09 DIAGNOSIS — Z8349 Family history of other endocrine, nutritional and metabolic diseases: Secondary | ICD-10-CM | POA: Diagnosis not present

## 2017-12-09 DIAGNOSIS — K219 Gastro-esophageal reflux disease without esophagitis: Secondary | ICD-10-CM | POA: Diagnosis present

## 2017-12-09 DIAGNOSIS — E8770 Fluid overload, unspecified: Secondary | ICD-10-CM

## 2017-12-09 DIAGNOSIS — I25810 Atherosclerosis of coronary artery bypass graft(s) without angina pectoris: Secondary | ICD-10-CM | POA: Diagnosis present

## 2017-12-09 DIAGNOSIS — T502X5A Adverse effect of carbonic-anhydrase inhibitors, benzothiadiazides and other diuretics, initial encounter: Secondary | ICD-10-CM | POA: Diagnosis not present

## 2017-12-09 DIAGNOSIS — Z953 Presence of xenogenic heart valve: Secondary | ICD-10-CM | POA: Diagnosis not present

## 2017-12-09 DIAGNOSIS — Z952 Presence of prosthetic heart valve: Secondary | ICD-10-CM

## 2017-12-09 DIAGNOSIS — E079 Disorder of thyroid, unspecified: Secondary | ICD-10-CM | POA: Diagnosis present

## 2017-12-09 DIAGNOSIS — Z8249 Family history of ischemic heart disease and other diseases of the circulatory system: Secondary | ICD-10-CM | POA: Diagnosis not present

## 2017-12-09 DIAGNOSIS — Z7952 Long term (current) use of systemic steroids: Secondary | ICD-10-CM

## 2017-12-09 DIAGNOSIS — I5023 Acute on chronic systolic (congestive) heart failure: Secondary | ICD-10-CM | POA: Diagnosis present

## 2017-12-09 DIAGNOSIS — I493 Ventricular premature depolarization: Secondary | ICD-10-CM | POA: Diagnosis present

## 2017-12-09 LAB — URINALYSIS, ROUTINE W REFLEX MICROSCOPIC
BILIRUBIN URINE: NEGATIVE
Bacteria, UA: NONE SEEN
Glucose, UA: NEGATIVE mg/dL
Ketones, ur: NEGATIVE mg/dL
Nitrite: NEGATIVE
Protein, ur: 30 mg/dL — AB
SPECIFIC GRAVITY, URINE: 1.012 (ref 1.005–1.030)
pH: 7 (ref 5.0–8.0)

## 2017-12-09 LAB — CBC
HCT: 29.1 % — ABNORMAL LOW (ref 39.0–52.0)
Hemoglobin: 9.1 g/dL — ABNORMAL LOW (ref 13.0–17.0)
MCH: 27.2 pg (ref 26.0–34.0)
MCHC: 31.3 g/dL (ref 30.0–36.0)
MCV: 86.9 fL (ref 78.0–100.0)
PLATELETS: 169 10*3/uL (ref 150–400)
RBC: 3.35 MIL/uL — ABNORMAL LOW (ref 4.22–5.81)
RDW: 17.4 % — ABNORMAL HIGH (ref 11.5–15.5)
WBC: 8.4 10*3/uL (ref 4.0–10.5)

## 2017-12-09 LAB — BASIC METABOLIC PANEL
Anion gap: 13 (ref 5–15)
BUN: 19 mg/dL (ref 8–23)
CALCIUM: 8.4 mg/dL — AB (ref 8.9–10.3)
CHLORIDE: 84 mmol/L — AB (ref 98–111)
CO2: 23 mmol/L (ref 22–32)
CREATININE: 1.17 mg/dL (ref 0.61–1.24)
GFR calc Af Amer: >60 mL/min (ref >60)
GFR calc non Af Amer: 54 mL/min — ABNORMAL LOW (ref >60)
Glucose, Bld: 137 mg/dL — ABNORMAL HIGH (ref 70–99)
Potassium: 4.6 mmol/L (ref 3.5–5.1)
Sodium: 120 mmol/L — ABNORMAL LOW (ref 135–145)

## 2017-12-09 LAB — NA AND K (SODIUM & POTASSIUM), RAND UR
Potassium Urine: 48 mmol/L
SODIUM UR: 28 mmol/L

## 2017-12-09 LAB — PROTEIN / CREATININE RATIO, URINE
Creatinine, Urine: 62.42 mg/dL
PROTEIN CREATININE RATIO: 0.62 mg/mg{creat} — AB (ref 0.00–0.15)
Total Protein, Urine: 39 mg/dL

## 2017-12-09 LAB — POC OCCULT BLOOD, ED: Fecal Occult Bld: NEGATIVE

## 2017-12-09 LAB — I-STAT TROPONIN, ED: TROPONIN I, POC: 0 ng/mL (ref 0.00–0.08)

## 2017-12-09 LAB — OSMOLALITY, URINE: Osmolality, Ur: 374 mOsm/kg (ref 300–900)

## 2017-12-09 LAB — BRAIN NATRIURETIC PEPTIDE: B Natriuretic Peptide: 2836.3 pg/mL — ABNORMAL HIGH (ref 0.0–100.0)

## 2017-12-09 LAB — MAGNESIUM: Magnesium: 1.7 mg/dL (ref 1.7–2.4)

## 2017-12-09 MED ORDER — FOLIC ACID 1 MG PO TABS
3.0000 mg | ORAL_TABLET | Freq: Every day | ORAL | Status: DC
  Administered 2017-12-10 – 2017-12-15 (×6): 3 mg via ORAL
  Filled 2017-12-09 (×7): qty 3

## 2017-12-09 MED ORDER — INFLUENZA VAC SPLIT HIGH-DOSE 0.5 ML IM SUSY
0.5000 mL | PREFILLED_SYRINGE | INTRAMUSCULAR | Status: AC
  Administered 2017-12-11: 0.5 mL via INTRAMUSCULAR
  Filled 2017-12-09: qty 0.5

## 2017-12-09 MED ORDER — SODIUM CHLORIDE 0.9 % IV SOLN
250.0000 mL | INTRAVENOUS | Status: DC | PRN
  Administered 2017-12-12: 250 mL via INTRAVENOUS

## 2017-12-09 MED ORDER — ACETAMINOPHEN 500 MG PO TABS: 1000.0000 mg | ORAL_TABLET | Freq: Two times a day (BID) | ORAL | Status: DC

## 2017-12-09 MED ORDER — FUROSEMIDE 10 MG/ML IJ SOLN
40.0000 mg | Freq: Once | INTRAMUSCULAR | Status: AC
  Administered 2017-12-09: 40 mg via INTRAVENOUS
  Filled 2017-12-09: qty 4

## 2017-12-09 MED ORDER — SODIUM CHLORIDE 0.9% FLUSH: 3.0000 mL | INTRAVENOUS | Status: DC | PRN

## 2017-12-09 MED ORDER — FLUTICASONE PROPIONATE 50 MCG/ACT NA SUSP: 1.0000 | NASAL | Status: DC | PRN

## 2017-12-09 MED ORDER — CLOPIDOGREL BISULFATE 75 MG PO TABS
75.0000 mg | ORAL_TABLET | Freq: Every day | ORAL | Status: DC
  Administered 2017-12-10 – 2017-12-15 (×6): 75 mg via ORAL
  Filled 2017-12-09 (×6): qty 1

## 2017-12-09 MED ORDER — AMLODIPINE BESYLATE 5 MG PO TABS
5.0000 mg | ORAL_TABLET | Freq: Every day | ORAL | Status: DC
  Administered 2017-12-10 – 2017-12-15 (×6): 5 mg via ORAL
  Filled 2017-12-09 (×6): qty 1

## 2017-12-09 MED ORDER — LOSARTAN POTASSIUM 25 MG PO TABS
25.0000 mg | ORAL_TABLET | Freq: Every day | ORAL | Status: DC
  Administered 2017-12-10 – 2017-12-11 (×2): 25 mg via ORAL
  Filled 2017-12-09 (×3): qty 1

## 2017-12-09 MED ORDER — FUROSEMIDE 10 MG/ML IJ SOLN
60.0000 mg | Freq: Two times a day (BID) | INTRAMUSCULAR | Status: DC
  Administered 2017-12-09 – 2017-12-12 (×6): 60 mg via INTRAVENOUS
  Filled 2017-12-09 (×6): qty 6

## 2017-12-09 MED ORDER — ATORVASTATIN CALCIUM 20 MG PO TABS
20.0000 mg | ORAL_TABLET | Freq: Every day | ORAL | Status: DC
  Administered 2017-12-10 – 2017-12-15 (×6): 20 mg via ORAL
  Filled 2017-12-09 (×6): qty 1

## 2017-12-09 MED ORDER — PREDNISONE 5 MG PO TABS
5.0000 mg | ORAL_TABLET | Freq: Every day | ORAL | Status: DC
  Administered 2017-12-10 – 2017-12-15 (×6): 5 mg via ORAL
  Filled 2017-12-09 (×6): qty 1

## 2017-12-09 MED ORDER — POTASSIUM CHLORIDE CRYS ER 20 MEQ PO TBCR
20.0000 meq | EXTENDED_RELEASE_TABLET | Freq: Two times a day (BID) | ORAL | Status: DC
  Administered 2017-12-09 – 2017-12-12 (×6): 20 meq via ORAL
  Filled 2017-12-09 (×7): qty 1

## 2017-12-09 MED ORDER — ONDANSETRON HCL 4 MG/2ML IJ SOLN: 4.0000 mg | Freq: Four times a day (QID) | INTRAMUSCULAR | Status: DC | PRN

## 2017-12-09 MED ORDER — LORATADINE 10 MG PO TABS
10.0000 mg | ORAL_TABLET | Freq: Every day | ORAL | Status: DC
  Administered 2017-12-10 – 2017-12-15 (×6): 10 mg via ORAL
  Filled 2017-12-09 (×6): qty 1

## 2017-12-09 MED ORDER — SODIUM CHLORIDE 0.9% FLUSH
3.0000 mL | Freq: Two times a day (BID) | INTRAVENOUS | Status: DC
  Administered 2017-12-09 – 2017-12-15 (×8): 3 mL via INTRAVENOUS

## 2017-12-09 MED ORDER — NITROGLYCERIN 0.4 MG SL SUBL: 0.4000 mg | SUBLINGUAL_TABLET | SUBLINGUAL | Status: DC | PRN

## 2017-12-09 MED ORDER — HEPARIN SODIUM (PORCINE) 5000 UNIT/ML IJ SOLN
5000.0000 [IU] | Freq: Three times a day (TID) | INTRAMUSCULAR | Status: DC
  Administered 2017-12-09 – 2017-12-15 (×17): 5000 [IU] via SUBCUTANEOUS
  Filled 2017-12-09 (×18): qty 1

## 2017-12-09 MED ORDER — CARVEDILOL 12.5 MG PO TABS
6.2500 mg | ORAL_TABLET | Freq: Two times a day (BID) | ORAL | Status: DC
  Administered 2017-12-09 – 2017-12-15 (×12): 6.25 mg via ORAL
  Filled 2017-12-09 (×12): qty 1

## 2017-12-09 MED ORDER — BOOST PO LIQD
237.0000 mL | Freq: Every day | ORAL | Status: DC
  Administered 2017-12-09 – 2017-12-15 (×5): 237 mL via ORAL
  Filled 2017-12-09 (×9): qty 237

## 2017-12-09 MED ORDER — FUROSEMIDE 20 MG PO TABS: 40.0000 mg | ORAL_TABLET | Freq: Two times a day (BID) | ORAL | Status: DC

## 2017-12-09 MED ORDER — PANTOPRAZOLE SODIUM 40 MG PO TBEC
40.0000 mg | DELAYED_RELEASE_TABLET | Freq: Every day | ORAL | Status: DC
  Administered 2017-12-10 – 2017-12-15 (×6): 40 mg via ORAL
  Filled 2017-12-09 (×6): qty 1

## 2017-12-09 MED ORDER — ASPIRIN EC 81 MG PO TBEC
81.0000 mg | DELAYED_RELEASE_TABLET | Freq: Every day | ORAL | Status: DC
  Administered 2017-12-10 – 2017-12-15 (×6): 81 mg via ORAL
  Filled 2017-12-09 (×6): qty 1

## 2017-12-09 MED ORDER — METHOTREXATE 2.5 MG PO TABS
25.0000 mg | ORAL_TABLET | ORAL | Status: DC
  Administered 2017-12-15: 25 mg via ORAL
  Filled 2017-12-09: qty 10

## 2017-12-09 MED ORDER — LEVOTHYROXINE SODIUM 50 MCG PO TABS
50.0000 ug | ORAL_TABLET | Freq: Every day | ORAL | Status: DC
  Administered 2017-12-10 – 2017-12-15 (×6): 50 ug via ORAL
  Filled 2017-12-09 (×7): qty 1

## 2017-12-09 MED ORDER — ACETAMINOPHEN 325 MG PO TABS: 650.0000 mg | ORAL_TABLET | ORAL | Status: DC | PRN

## 2017-12-09 NOTE — H&P (Signed)
History and Physical    Hunter Choi ZOX:[id number] DOB: [birth year] DOA: 12/09/2017  PCP: [name], MD   Patient coming from: Home  I have personally briefly reviewed patient's old medical records in [facility]  Chief Complaint: Increased shortness of breath over the last few days  HPI: Hunter Choi is a 84 y.o. male with medical history significant of Neri artery disease status post coronary artery bypass graft in 1999 status post TAVR in 2014, chronic kidney disease stage III, acute on chronic combined systolic and diastolic congestive heart failure with an ejection fraction of 30 to 35% echocardiogram in May 2019, hypothyroidism, gastroesophageal reflux disease, and hard of hearing who presents the emergency department increasing shortness of breath started more than 2 days ago.  The problem has been gradually worsening.  He has associated orthopnea and bilateral lower extremity leg swelling right greater than left.  He has had no fever, no headache, no rhinorrhea, no sore throat, no swollen glands, no ear pain, no neck pain, no cough, no sputum production, no hemoptysis, no wheezing, no chest pain, no vomiting, no abdominal pain, no rash, no leg pain and no claudication.  He has noted some increase gain in his weight.  He has not tried anything for his symptoms nothing made the symptoms worse nothing made it better.  Previously hospitalized for congestive heart failure in February 2017 with a an admission for myocardial infarction in November 2018 and several admissions for fever and infection between those.  Much of the patient's history is obtained from the ER record and the patient's son, Apolinar Junes, as the patient does have some baseline confusion.  He is feeling better currently in the emergency department having received some Lasix.  Oertli not requiring supplemental oxygen.  ED Course: Chest x-ray showed bibasilar infiltrate consistent with edema, sodium of 120,  glucose 137, hemoglobin 9.1, creatinine 1 7 with a GFR ranging from 37-54 in the past 6 months.  Review of Systems: As per HPI otherwise all other systems reviewed and  negative.   Past Medical History:  Diagnosis Date  . AAA (abdominal aortic aneurysm) (HCC)    Dr. Ashley Royalty  . Aortic stenosis, severe    a. s/p TAVR  . Blind left eye   . BPH (benign prostatic hypertrophy)    Dr. Retta Diones  . CAD (coronary artery disease) of bypass graft    Chronic occlusion of saphenous vein graft to RCA  . Coronary artery disease    Left main disease and severe 3-vessel CAD  . Diastolic heart failure (HCC)   . Embolism involving retinal artery   . GERD (gastroesophageal reflux disease)   . Hard of hearing   . Hypertension   . Hypothyroidism   . Iliac artery aneurysm (HCC)    Dr. Edilia Bo  . PAF (paroxysmal atrial fibrillation) (HCC)   . 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 12/2012: mm Stephannie Peters XT transcatheter heart valve placed via transapical approach  . Stroke [facility])    mini stroke effective his eye left    Past Surgical History:  Procedure Laterality Date  . BIOPSY PROSTATE  2005  . BUNIONECTOMY WITH HAMMERTOE RECONSTRUCTION Bilateral   . CARDIAC CATHETERIZATION    . CARDIAC CATHETERIZATION N/A 05/07/2015   Procedure: Right Heart Cath and Coronary/Graft Angiography;  Surgeon: Lyn Records, MD;  Location: [facility] INVASIVE CV LAB;  Service: Cardiovascular;  Laterality: N/A;  . CAROTID ENDARTERECTOMY Right Jan. 2, 1997  . CATARACT  EXTRACTION W/ INTRAOCULAR LENS IMPLANT Right   . CHOLECYSTECTOMY N/A 01/28/2015   Procedure: LAPAROSCOPIC CHOLECYSTECTOMY WITH INTRAOPERATIVE CHOLANGIOGRAM;  Surgeon: Gaynelle Adu, MD;  Location: [facility] OR;  Service: General;  Laterality: N/A;  . COLONOSCOPY    . CORONARY ARTERY BYPASS GRAFT  01/1998   Dr. Sheliah Plane; LIMA to LAD, SVG to D1, SVG to LCx, SVG to RCA, open saphenous vein harvest via right lower extremity  . HARVEST  BONE GRAFT  1982   FOR THE  ANKLE  . HEMIARTHROPLASTY HIP Left    AFTER HIP FX  . INTRAOPERATIVE TRANSESOPHAGEAL ECHOCARDIOGRAM N/A 01/15/2013   Procedure: INTRAOPERATIVE TRANSESOPHAGEAL ECHOCARDIOGRAM;  Surgeon: [name], MD;  Location: [facility] OR;  Service: Open Heart Surgery;  Laterality: N/A;  . JOINT REPLACEMENT    . LEFT AND RIGHT HEART CATHETERIZATION WITH CORONARY ANGIOGRAM N/A 12/20/2012   Procedure: LEFT AND RIGHT HEART CATHETERIZATION WITH CORONARY ANGIOGRAM;  Surgeon: Lesleigh Noe, MD;  Location: [facility] CATH LAB;  Service: Cardiovascular;  Laterality: N/A;  . LEFT HEART CATH AND CORS/GRAFTS ANGIOGRAPHY N/A 02/14/2017   Procedure: LEFT HEART CATH AND CORS/GRAFTS ANGIOGRAPHY;  Surgeon: Marykay Lex, MD;  Location[facility] INVASIVE CV LAB;  Service: Cardiovascular;  Laterality: N/A;  . SKIN GRAFTS  1968  . TOTAL HIP ARTHROPLASTY Left 2007  . TOTAL KNEE ARTHROPLASTY  1992-2002   right-left  . TRANSCATHETER AORTIC VALVE REPLACEMENT, TRANSAPICAL N/A 01/15/2013   Procedure: TRANSCATHETER AORTIC VALVE REPLACEMENT, TRANSAPICAL;  Surgeon: [name], MD;  Location: [facility] OR;  Service: Open Heart Surgery;  Laterality: N/A;  . TRANSURETHRAL RESECTION OF PROSTATE  2012    Social History   Social History Narrative  . Not on file     reports that he quit smoking about 54 years ago. His smoking use included cigarettes. He has a 18.50 pack-year smoking history. He has quit using smokeless tobacco.  His smokeless tobacco use included chew. He reports that he does not drink alcohol or use drugs.  Allergies  Allergen Reactions  . Ciprofloxacin Other (See Comments)    REACTION: mild delirium    Family History  Problem Relation Age of Onset  . Heart attack Mother   . Heart disease Mother        Before age 19  . Hyperlipidemia Mother   . Hypertension Mother   . Hypertension Son   . Heart disease Father        AAA-   . Hyperlipidemia Father   . Heart attack Father   . Hypertension  Father   . Heart disease Brother        Before age 1  . Hyperlipidemia Brother   . Heart attack Brother   . Hypertension Brother     Prior to Admission medications   Medication Sig Start Date End Date Taking? Authorizing Provider  acetaminophen (TYLENOL) 325 MG tablet Take 2 tablets (650 mg total) by mouth every 6 (six) hours as needed for mild pain, moderate pain, fever or headache. Patient taking differently: Take 1,000 mg by mouth 2 (two) times daily.  02/03/15  Yes Hongalgi, Maximino Greenland, MD  amLODipine (NORVASC) 5 MG tablet Take 5 mg by mouth daily.   Yes [provider]  aspirin EC 81 MG EC tablet Take 1 tablet (81 mg total) by mouth daily. 01/21/13  Yes Doree Fudge M, PA-C  atorvastatin (LIPITOR) 20 MG tablet Take 20 mg by mouth daily.   Yes [provider]  carvedilol (COREG) 6.25 MG tablet  Take 1 tablet (6.25 mg total) by mouth 2 (two) times daily with a meal. 10/21/15  Yes Lyn Records, MD  clopidogrel (PLAVIX) 75 MG tablet Take 1 tablet (75 mg total) by mouth daily. 04/19/17  Yes Leone Brand, NP  fluticasone (FLONASE) 50 MCG/ACT nasal spray Place 1 spray into both nostrils as needed.  06/29/16  Yes [provider]  folic acid (FOLVITE) 1 MG tablet Take 3 mg by mouth daily.   Yes [provider]  furosemide (LASIX) 20 MG tablet Take 40 mg by mouth 2 (two) times daily.    Yes [provider]  lactose free nutrition (BOOST) LIQD Take 237 mLs daily by mouth.   Yes [provider]  levothyroxine (SYNTHROID, LEVOTHROID) 50 MCG tablet Take 50 mcg by mouth daily. 11/06/14  Yes [provider]  loratadine (CLARITIN) 10 MG tablet Take 10 mg by mouth daily. 06/29/16  Yes [provider]  losartan (COZAAR) 25 MG tablet Take 1 tablet (25 mg total) by mouth daily. 04/19/17  Yes Leone Brand, NP  methotrexate 2.5 MG tablet Take 25 mg by mouth every Friday.    Yes [provider]  nitroGLYCERIN (NITROSTAT) 0.4 MG  SL tablet PLACE 1 TABLET (0.4 MG TOTAL) UNDER THE TONGUE EVERY 5 MINUTES AS NEEDED FOR CHEST PAIN Patient taking differently: Place 0.4 mg under the tongue every 5 (five) minutes as needed.  12/21/16  Yes Lyn Records, MD  pantoprazole (PROTONIX) 40 MG tablet Take 1 tablet (40 mg total) by mouth daily. 02/18/17  Yes Azalee Course, PA  predniSONE (DELTASONE) 5 MG tablet Take 5 mg by mouth daily. 04/07/15  Yes [provider]    Physical Exam:  Constitutional: NAD, calm, comfortable Vitals:   12/09/17 1229 12/09/17 1300 12/09/17 1330 12/09/17 1400  BP: 118/65 117/65 118/65 118/65  Pulse: 67 62 60 (!) 58  Resp: (!) 24 16 15 14   Temp: 98 F (36.7 C)     TempSrc: Oral     SpO2: 96% 97% 95% 97%   Eyes: PERRL, lids and conjunctivae normal ENMT: Mucous membranes are moist. Posterior pharynx clear of any exudate or lesions.Normal dentition.  Neck: normal, supple, no masses, no thyromegaly Respiratory: Coarse breath sounds bilaterally, no wheezing, bibasilar crackles. Normal respiratory effort. No accessory muscle use.  Cardiovascular: Regular rate and rhythm, no murmurs / rubs / gallops.  2+ extremity edema pitting right greater than left. 2+ pedal pulses. No carotid bruits.  Abdomen: no tenderness, no masses palpated. No hepatosplenomegaly. Bowel sounds positive.  Musculoskeletal: no clubbing / cyanosis. No joint deformity upper and lower extremities. Good ROM, no contractures. Normal muscle tone.  Skin: no rashes, lesions, ulcers. No induration Neurologic: CN 2-12 grossly intact. Sensation intact, DTR normal. Strength 5/5 in all 4.  Psychiatric: Normal judgment and insight. Alert and oriented x 3. Normal mood.   Labs on Admission: I have personally reviewed following labs and imaging studies  CBC: Recent Labs  Lab 12/09/17 1300  WBC 8.4  HGB 9.1*  HCT 29.1*  MCV 86.9  PLT 169   Basic Metabolic Panel: Recent Labs  Lab 12/09/17 1300  NA 120*  K 4.6  CL 84*  CO2 23    GLUCOSE 137*  BUN 19  CREATININE 1.17  CALCIUM 8.4*   Urine analysis:    Component Value Date/Time   COLORURINE YELLOW 08/08/2017 0822   APPEARANCEUR HAZY (A) 08/08/2017 0822   LABSPEC 1.016 08/08/2017 0822   PHURINE 5.0 08/08/2017  0786   GLUCOSEU NEGATIVE 08/08/2017 0822   HGBUR MODERATE (A) 08/08/2017 0822   BILIRUBINUR NEGATIVE 08/08/2017 0822   KETONESUR NEGATIVE 08/08/2017 0822   PROTEINUR 100 (A) 08/08/2017 0822   UROBILINOGEN 4.0 (H) 11/14/2014 1849   NITRITE NEGATIVE 08/08/2017 0822   LEUKOCYTESUR NEGATIVE 08/08/2017 0822    Radiological Exams on Admission: Dg Chest 2 View  Result Date: 12/09/2017 CLINICAL DATA:  Increased shortness of breath EXAM: CHEST - 2 VIEW COMPARISON:  08/08/2017 FINDINGS: Cardiac shadow remains enlarged. Changes of prior aortic valve replacement are seen. Coronary bypass grafting changes are noted as well. The lungs are well aerated bilaterally. Bibasilar lower lobe opacities are noted likely related to acute infiltrate. No sizable effusion is seen. No bony abnormality is noted. IMPRESSION: Bibasilar infiltrative change Electronically Signed   By: Alcide Clever M.D.   On: 12/09/2017 13:49    EKG: Independently reviewed.  Sinus rhythm with a first-degree AV block, right axis deviation and nonspecific interventricular conduction delay unchanged from 2014 with the exception that the left block is no longer evident.  Assessment/Plan Principal Problem:   Acute on chronic combined systolic and diastolic congestive heart failure, NYHA class 4 (HCC) Active Problems:   Hyponatremia   CKD (chronic kidney disease) stage 3, GFR 30-59 ml/min (HCC)   GERD (gastroesophageal reflux disease)   Thyroid disease   S/P TAVR (transcatheter aortic valve replacement)   Hard of hearing   Acute on chronic systolic CHF (congestive heart failure), NYHA class 4 (HCC)   1.  Acute on chronic combined systolic and diastolic congestive heart failure [place] Heart  Association class IV: Patient with edema on chest x-ray with shortness of breath with exertion.  Lower extremity edema noted as well.  We will admit him into the hospital in order Lasix 60 mg IV every 12 hours.  He has received 40 mg in the emergency department.  We will monitor response to treatment closely with daily weights and ins and outs.  I have placed him on a 1200 mL fluid restriction with no water or ice to restrict free water.  2.  Hyponatremia: Likely related to volume overload with excessive water intake.  Will restrict free water and give IV Lasix and recheck in a.m.  I have discussed the case with Dr. Anne Fu who does not feel cardiology needs to see the patient today but if he does not improve over the next several days would consider cardiac consultation as well as renal consultation.  3.  Chronic kidney disease stage III with a GFR of 30-59: This is noted.  Will avoid nephrotoxic agents.  Patient may ultimately require a referral to nephrology if GFR worsens.  4.  Gastroesophageal reflux disease: Continue home medication management.  5.  Hypothyroidism: Continue Synthroid.  6.  Status post TAVR: Noted continue present care.  7.  Hard of hearing: Noted speak directly to patient.    DVT prophylaxis: Heparin Code Status: Full code Family Communication: Patient's son Apolinar Junes who is his primary caretaker and present at the time of admission. Disposition Plan: Home in the care of his son Apolinar Junes Consults called: Spoke with Dr. Anne Fu.  No cardiology consult at the present time. Admission status: Inpatient   [name] MD FACP [facility] Pager [phone]  If 7PM-7AM, please contact night-coverage [email or web address] Password [facility]  12/09/2017, 2:44 PM

## 2017-12-09 NOTE — ED Notes (Signed)
Patient transported to X-ray 

## 2017-12-09 NOTE — Progress Notes (Signed)
Report received from ED 

## 2017-12-09 NOTE — ED Provider Notes (Signed)
[facility] EMERGENCY DEPARTMENT Provider Note   CSN: [id number] Arrival date & time: 12/09/17  1207     History   Chief Complaint Chief Complaint  Patient presents with  . Shortness of Breath    HPI Hunter Choi is a 84 y.o. male.  The history is provided by the patient and a caregiver.  Shortness of Breath  This is a recurrent problem. The current episode started more than 2 days ago. The problem has been gradually worsening. Associated symptoms include orthopnea and leg swelling. Pertinent negatives include no fever, no headaches, no rhinorrhea, no sore throat, no swollen glands, no ear pain, no neck pain, no cough, no sputum production, no hemoptysis, no wheezing, no chest pain, no vomiting, no abdominal pain, no rash, no leg pain and no claudication. It is unknown what precipitated the problem. He has tried nothing for the symptoms. The treatment provided no relief. He has had prior hospitalizations. He has had prior ED visits. Associated medical issues include CAD (CHF).    Past Medical History:  Diagnosis Date  . AAA (abdominal aortic aneurysm) (HCC)    Dr. Ashley Royalty  . Aortic stenosis, severe    a. s/p TAVR  . Blind left eye   . BPH (benign prostatic hypertrophy)    Dr. Retta Diones  . CAD (coronary artery disease) of bypass graft    Chronic occlusion of saphenous vein graft to RCA  . Coronary artery disease    Left main disease and severe 3-vessel CAD  . Diastolic heart failure (HCC)   . Embolism involving retinal artery   . GERD (gastroesophageal reflux disease)   . Hard of hearing   . Hypertension   . Hypothyroidism   . Iliac artery aneurysm (HCC)    Dr. Edilia Bo  . PAF (paroxysmal atrial fibrillation) (HCC)   . 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 12/2012: mm Stephannie Peters XT transcatheter heart valve placed via transapical approach  . Stroke [facility])    mini stroke effective his eye left    Patient Active  Problem List   Diagnosis Date Noted  . CKD (chronic kidney disease) stage 3, GFR 30-59 ml/min (HCC) 12/09/2017  . SIRS (systemic inflammatory response syndrome) (HCC) 08/03/2017  . Essential hypertension 08/03/2017  . Elevated troponin   . Sepsis due to urinary tract infection (HCC) 10/02/2015  . Hard of hearing   . Pressure ulcer 01/28/2015  . Acute on chronic combined systolic and diastolic congestive heart failure, NYHA class 4 (HCC) 01/27/2015  . Hyponatremia 11/21/2014  . Elevated LFTs 11/14/2014  . Anemia 05/23/2014  . Thrombocytopenia (HCC) 02/21/2014  . Malnutrition of moderate degree (HCC) 02/21/2014  . Occlusion and stenosis of carotid artery without mention of cerebral infarction 09/04/2013  . Aftercare following surgery of the circulatory system, NEC 09/04/2013  . AAA (abdominal aortic aneurysm) without rupture (HCC) 03/06/2013  . 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 Coronary artery disease involving coronary bypass graft of native heart with angina pectoris (HCC)   . GERD (gastroesophageal reflux disease)   . Thyroid disease   . Embolism involving retinal artery   . Iliac artery aneurysm (HCC)   . Rheumatoid arthritis (HCC) 04/22/2010    Past Surgical History:  Procedure Laterality Date  . BIOPSY PROSTATE  2005  . BUNIONECTOMY WITH HAMMERTOE RECONSTRUCTION Bilateral   . CARDIAC CATHETERIZATION    . CARDIAC CATHETERIZATION N/A 05/07/2015   Procedure: Right Heart Cath and Coronary/Graft Angiography;  Surgeon: Lyn Records, MD;  Location: [facility] INVASIVE CV LAB;  Service: Cardiovascular;  Laterality: N/A;  . CAROTID ENDARTERECTOMY Right Jan. 2, 1997  . CATARACT EXTRACTION W/ INTRAOCULAR LENS IMPLANT Right   . CHOLECYSTECTOMY N/A 01/28/2015   Procedure: LAPAROSCOPIC CHOLECYSTECTOMY WITH INTRAOPERATIVE CHOLANGIOGRAM;  Surgeon: Gaynelle Adu, MD;  Location: [facility] OR;  Service: General;  Laterality: N/A;  . COLONOSCOPY    . CORONARY ARTERY BYPASS GRAFT   01/1998   Dr. Sheliah Plane; LIMA to LAD, SVG to D1, SVG to LCx, SVG to RCA, open saphenous vein harvest via right lower extremity  . HARVEST BONE GRAFT  1982   FOR THE  ANKLE  . HEMIARTHROPLASTY HIP Left    AFTER HIP FX  . INTRAOPERATIVE TRANSESOPHAGEAL ECHOCARDIOGRAM N/A 01/15/2013   Procedure: INTRAOPERATIVE TRANSESOPHAGEAL ECHOCARDIOGRAM;  Surgeon: [name], MD;  Location: [facility] OR;  Service: Open Heart Surgery;  Laterality: N/A;  . JOINT REPLACEMENT    . LEFT AND RIGHT HEART CATHETERIZATION WITH CORONARY ANGIOGRAM N/A 12/20/2012   Procedure: LEFT AND RIGHT HEART CATHETERIZATION WITH CORONARY ANGIOGRAM;  Surgeon: Lesleigh Noe, MD;  Location: [facility] CATH LAB;  Service: Cardiovascular;  Laterality: N/A;  . LEFT HEART CATH AND CORS/GRAFTS ANGIOGRAPHY N/A 02/14/2017   Procedure: LEFT HEART CATH AND CORS/GRAFTS ANGIOGRAPHY;  Surgeon: Marykay Lex, MD;  Location: [facility] INVASIVE CV LAB;  Service: Cardiovascular;  Laterality: N/A;  . SKIN GRAFTS  1968  . TOTAL HIP ARTHROPLASTY Left 2007  . TOTAL KNEE ARTHROPLASTY  1992-2002   right-left  . TRANSCATHETER AORTIC VALVE REPLACEMENT, TRANSAPICAL N/A 01/15/2013   Procedure: TRANSCATHETER AORTIC VALVE REPLACEMENT, TRANSAPICAL;  Surgeon: [name], MD;  Location: [facility] OR;  Service: Open Heart Surgery;  Laterality: N/A;  . TRANSURETHRAL RESECTION OF PROSTATE  2012        Home Medications    Prior to Admission medications   Medication Sig Start Date End Date Taking? Authorizing Provider  acetaminophen (TYLENOL) 325 MG tablet Take 2 tablets (650 mg total) by mouth every 6 (six) hours as needed for mild pain, moderate pain, fever or headache. Patient taking differently: Take 1,000 mg by mouth 2 (two) times daily.  02/03/15  Yes Hongalgi, Maximino Greenland, MD  amLODipine (NORVASC) 5 MG tablet Take 5 mg by mouth daily.   Yes [provider]  aspirin EC 81 MG EC tablet Take 1 tablet (81 mg total) by mouth daily. 01/21/13  Yes Doree Fudge  M, PA-C  atorvastatin (LIPITOR) 20 MG tablet Take 20 mg by mouth daily.   Yes [provider]  carvedilol (COREG) 6.25 MG tablet Take 1 tablet (6.25 mg total) by mouth 2 (two) times daily with a meal. 10/21/15  Yes Lyn Records, MD  clopidogrel (PLAVIX) 75 MG tablet Take 1 tablet (75 mg total) by mouth daily. 04/19/17  Yes Leone Brand, NP  fluticasone (FLONASE) 50 MCG/ACT nasal spray Place 1 spray into both nostrils as needed.  06/29/16  Yes [provider]  folic acid (FOLVITE) 1 MG tablet Take 3 mg by mouth daily.   Yes [provider]  furosemide (LASIX) 20 MG tablet Take 40 mg by mouth 2 (two) times daily.    Yes [provider]  lactose free nutrition (BOOST) LIQD Take 237 mLs daily by mouth.   Yes [provider]  levothyroxine (SYNTHROID, LEVOTHROID) 50 MCG tablet Take 50 mcg by mouth daily. 11/06/14  Yes [provider]  loratadine (CLARITIN) 10 MG tablet Take 10  mg by mouth daily. 06/29/16  Yes [provider]  losartan (COZAAR) 25 MG tablet Take 1 tablet (25 mg total) by mouth daily. 04/19/17  Yes Leone Brand, NP  methotrexate 2.5 MG tablet Take 25 mg by mouth every Friday.    Yes [provider]  nitroGLYCERIN (NITROSTAT) 0.4 MG SL tablet PLACE 1 TABLET (0.4 MG TOTAL) UNDER THE TONGUE EVERY 5 MINUTES AS NEEDED FOR CHEST PAIN Patient taking differently: Place 0.4 mg under the tongue every 5 (five) minutes as needed.  12/21/16  Yes Lyn Records, MD  pantoprazole (PROTONIX) 40 MG tablet Take 1 tablet (40 mg total) by mouth daily. 02/18/17  Yes Azalee Course, PA  predniSONE (DELTASONE) 5 MG tablet Take 5 mg by mouth daily. 04/07/15  Yes [provider]    Family History Family History  Problem Relation Age of Onset  . Heart attack Mother   . Heart disease Mother        Before age 39  . Hyperlipidemia Mother   . Hypertension Mother   . Hypertension Son   . Heart disease Father        AAA-   .  Hyperlipidemia Father   . Heart attack Father   . Hypertension Father   . Heart disease Brother        Before age 72  . Hyperlipidemia Brother   . Heart attack Brother   . Hypertension Brother     Social History Social History   Tobacco Use  . Smoking status: Former Smoker    Packs/day: 0.50    Years: 37.00    Pack years: 18.50    Types: Cigarettes    Last attempt to quit: 03/29/1963    Years since quitting: 54.7  . Smokeless tobacco: Former Neurosurgeon    Types: Chew  Substance Use Topics  . Alcohol use: No  . Drug use: No     Allergies   Ciprofloxacin   Review of Systems Review of Systems  Constitutional: Negative for chills and fever.  HENT: Negative for ear pain, rhinorrhea and sore throat.   Eyes: Negative for pain and visual disturbance.  Respiratory: Positive for shortness of breath. Negative for cough, hemoptysis, sputum production and wheezing.   Cardiovascular: Positive for orthopnea and leg swelling. Negative for chest pain, palpitations and claudication.  Gastrointestinal: Negative for abdominal pain and vomiting.  Genitourinary: Negative for dysuria and hematuria.  Musculoskeletal: Negative for arthralgias, back pain and neck pain.  Skin: Negative for color change and rash.  Neurological: Negative for seizures, syncope and headaches.  All other systems reviewed and are negative.    Physical Exam Updated Vital Signs  ED Triage Vitals [12/09/17 1229]  Enc Vitals Group     BP 118/65     Pulse Rate 67     Resp (!) 24     Temp 98 F (36.7 C)     Temp Source Oral     SpO2 96 %     Weight      Height      Head Circumference      Peak Flow      Pain Score 0     Pain Loc      Pain Edu?      Excl. in GC?     Physical Exam  Constitutional: He is oriented to person, place, and time. He appears well-developed and well-nourished. No distress.  HENT:  Head: Normocephalic and atraumatic.  Mouth/Throat: No oropharyngeal exudate.  Eyes:  Pupils are equal,  round, and reactive to light. Conjunctivae and EOM are normal.  Neck: Normal range of motion. Neck supple.  Cardiovascular: Normal rate and regular rhythm.  No murmur heard. Pulmonary/Chest: Tachypnea noted. No respiratory distress. He has rales.  Abdominal: Soft. Bowel sounds are normal. There is no tenderness.  Musculoskeletal:       Right lower leg: He exhibits edema (2+ pitting).       Left lower leg: He exhibits edema (2+ pitting).  Neurological: He is alert and oriented to person, place, and time.  Skin: Skin is warm and dry.  Psychiatric: He has a normal mood and affect.  Nursing note and vitals reviewed.    ED Treatments / Results  Labs (all labs ordered are listed, but only abnormal results are displayed) Labs Reviewed  BASIC METABOLIC PANEL - Abnormal; Notable for the following components:      Result Value   Sodium 120 (*)    Chloride 84 (*)    Glucose, Bld 137 (*)    Calcium 8.4 (*)    GFR calc non Af Amer 54 (*)    All other components within normal limits  CBC - Abnormal; Notable for the following components:   RBC 3.35 (*)    Hemoglobin 9.1 (*)    HCT 29.1 (*)    RDW 17.4 (*)    All other components within normal limits  BRAIN NATRIURETIC PEPTIDE  URINALYSIS, ROUTINE W REFLEX MICROSCOPIC  OSMOLALITY, URINE  NA AND K (SODIUM & POTASSIUM), RAND UR  PROTEIN / CREATININE RATIO, URINE  MAGNESIUM  I-STAT TROPONIN, ED  POC OCCULT BLOOD, ED    EKG EKG Interpretation  Date/Time:  Saturday December 09 2017 12:26:08 EDT Ventricular Rate:  68 PR Interval:  302 QRS Duration: 148 QT Interval:  446 QTC Calculation: 474 R Axis:   115 Text Interpretation:  Sinus rhythm with 1st degree A-V block Right axis deviation Non-specific intra-ventricular conduction block Abnormal ECG Confirmed by Virgina Norfolk [phone]) on 12/09/2017 12:44:06 PM   Radiology Dg Chest 2 View  Result Date: 12/09/2017 CLINICAL DATA:  Increased shortness of breath EXAM: CHEST - 2 VIEW  COMPARISON:  08/08/2017 FINDINGS: Cardiac shadow remains enlarged. Changes of prior aortic valve replacement are seen. Coronary bypass grafting changes are noted as well. The lungs are well aerated bilaterally. Bibasilar lower lobe opacities are noted likely related to acute infiltrate. No sizable effusion is seen. No bony abnormality is noted. IMPRESSION: Bibasilar infiltrative change Electronically Signed   By: Alcide Clever M.D.   On: 12/09/2017 13:49    Procedures .Critical Care Performed by: Virgina Norfolk, DO Authorized by: Virgina Norfolk, DO   Critical care provider statement:    Critical care time (minutes):  30   Critical care time was exclusive of:  Separately billable procedures and treating other patients and teaching time   Critical care was necessary to treat or prevent imminent or life-threatening deterioration of the following conditions: hyponatremia.   Critical care was time spent personally by me on the following activities:  Development of treatment plan with patient or surrogate, discussions with primary provider, evaluation of patient's response to treatment, examination of patient, ordering and performing treatments and interventions, ordering and review of laboratory studies, ordering and review of radiographic studies, pulse oximetry, re-evaluation of patient's condition and review of old charts   I assumed direction of critical care for this patient from another provider in my specialty: no     (including critical care time)  Medications Ordered in ED Medications  furosemide (LASIX) injection 40 mg (has no administration in time range)     Initial Impression / Assessment and Plan / ED Course  I have reviewed the triage vital signs and the nursing notes.  Pertinent labs & imaging results that were available during my care of the patient were reviewed by me and considered in my medical decision making (see chart for details).     Hunter Choi is an 84 year old  male with history of hypertension, heart failure, CAD who presents to the ED with shortness of breath.  Patient with unremarkable vitals.  No fever.  Patient with increased shortness of breath, leg swelling over the last several days.  Overall increase in general fatigue during that time.  Patient with orthopnea.  Denies any chest pain, abdominal pain.  Patient's son is at the bedside who is his primary caregiver states that patient has been more lethargic than usual.  Denies any fever, cough, sputum production.  EKG shows sinus rhythm with no signs of ischemic changes.  Patient with rales on exam, 2+ pitting edema bilaterally in his legs.  Concern for heart failure, electrolyte abnormalities.  Lab work obtained.  Chest x-ray also obtained.  Patient with sodium of 120 and signs of possible fluid on chest x-ray.  Clinically patient appears volume overloaded.  Hemoglobin decreased to 9.1 but no gross melena or hematochezia on rectal exam.  Patient with unremarkable troponin.  Suspect patient likely with volume overload causing electrolyte abnormalities.  Hyponatremia 120 likely causing increase in fatigue.  Given clinical picture we will treat with IV Lasix 40 mg after discussion with medicine.  Patient with overall normal mental status neurological exam.  Patient to be admitted for further work-up.  Urine studies including urine electrolytes ordered.  Patient hemodynamically stable throughout my care.  This chart was dictated using voice recognition software.  Despite best efforts to proofread,  errors can occur which can change the documentation meaning.  Final Clinical Impressions(s) / ED Diagnoses   Final diagnoses:  Hyponatremia  Hypervolemia, unspecified hypervolemia type    ED Discharge Orders    None       Virgina Norfolk, DO 12/09/17 1427

## 2017-12-09 NOTE — ED Notes (Signed)
Pt returned from xray

## 2017-12-09 NOTE — ED Triage Notes (Signed)
Pt in c/o increased shortness of breath over the last few days, pt denies pain, labored at rest, hx of CHF, reports cough as well and increased swelling in lower extremities

## 2017-12-09 NOTE — Progress Notes (Signed)
Patient is unable to ambulate, order to ambulate not carried out due to pt report of bedfast.

## 2017-12-10 LAB — BASIC METABOLIC PANEL
ANION GAP: 11 (ref 5–15)
ANION GAP: 13 (ref 5–15)
BUN: 21 mg/dL (ref 8–23)
BUN: 21 mg/dL (ref 8–23)
CHLORIDE: 86 mmol/L — AB (ref 98–111)
CHLORIDE: 85 mmol/L — AB (ref 98–111)
CO2: 25 mmol/L (ref 22–32)
CO2: 25 mmol/L (ref 22–32)
Calcium: 8.4 mg/dL — ABNORMAL LOW (ref 8.9–10.3)
Calcium: 8.2 mg/dL — ABNORMAL LOW (ref 8.9–10.3)
Creatinine, Ser: 1.3 mg/dL — ABNORMAL HIGH (ref 0.61–1.24)
Creatinine, Ser: 1.27 mg/dL — ABNORMAL HIGH (ref 0.61–1.24)
GFR calc Af Amer: 56 mL/min — ABNORMAL LOW (ref >60)
GFR calc non Af Amer: 47 mL/min — ABNORMAL LOW (ref >60)
GFR, EST AFRICAN AMERICAN: 55 mL/min — AB (ref >60)
GFR, EST NON AFRICAN AMERICAN: 49 mL/min — AB (ref >60)
Glucose, Bld: 121 mg/dL — ABNORMAL HIGH (ref 70–99)
Glucose, Bld: 128 mg/dL — ABNORMAL HIGH (ref 70–99)
POTASSIUM: 4.1 mmol/L (ref 3.5–5.1)
Potassium: 4.1 mmol/L (ref 3.5–5.1)
Sodium: 122 mmol/L — ABNORMAL LOW (ref 135–145)
Sodium: 123 mmol/L — ABNORMAL LOW (ref 135–145)

## 2017-12-10 NOTE — Progress Notes (Signed)
PROGRESS NOTE                                                                                                                                                                                                             Patient Demographics:    Hunter Choi, is a 84 y.o. male, DOB - [birth year], ZOX:[id number]  Admit date - 12/09/2017   Admitting Physician [name], MD  Outpatient Primary MD for the patient is [name], MD  LOS - 1  Outpatient Specialists: cardiolgy  Chief Complaint  Patient presents with  . Shortness of Breath       Brief Narrative 84 year old male with history of CAD status post CABG in 1999, status post TAVR in 2014, chronic, systolic and diastolic CHF with EF of 30-35% as per last echo, CKD stage III, hypothyroidism, GERD and severe hard of hearing presented to the ED with increasing shortness of breath for past 2 days or more symptoms gradually worsening and associated with orthopnea and bilateral lower leg edema (R > L).  Patient found to have pulmonary edema on chest x-ray and severe hyponatremia with sodium of 120. Admitted for acute on chronic combined systolic and diastolic CHF.   Subjective:   Patient is very hard of hearing and difficult to carry out a good conversation.  Reports his breathing to be better since admission.   Assessment  & Plan :    Principal Problem:   Acute on chronic combined systolic and diastolic congestive heart failure, NYHA class 4 (HCC) Continue IV Lasix 60 mg every 12 hours.  Showing good diuresis.  Strict I's/O and daily weight.  Continue aspirin, Coreg, losartan.  Strict fluid restriction of 1200 cc.  Continue potassium supplement. Cardiology consult if symptoms unimproved.  Active Problems: Hyponatremia Likely hypervolemic hyponatremia with fluid overload and excessive water intake.  Continue strict fluid restriction and monitor with IV  Lasix.  Sodium slowly improving in a.m. Lab.   CKD (chronic kidney disease) stage 3, GFR 30-59 ml/min (HCC) At baseline.  Monitor with aggressive diuresis.  CAD with history of CABG/status post TAVR Continue aspirin, beta-blocker and Plavix.  Paroxysmal A. fib Rate controlled.  Not on anticoagulation.  Continue aspirin and beta-blocker.  Hypothyroidism Continue Synthroid  GERD Continue PPI     Code Status : Full code  Family Communication  :  His son will update his son  Disposition Plan  : Home pending hospital course.  PT evaluation  Barriers For Discharge : Active symptoms  Consults  : None  Procedures  : None  DVT Prophylaxis  : Subcu heparin  Lab Results  Component Value Date   PLT 169 12/09/2017    Antibiotics  :    Anti-infectives (From admission, onward)   None        Objective:   Vitals:   12/09/17 1929 12/10/17 0053 12/10/17 0512 12/10/17 0945  BP: 124/67 (!) 114/55 131/60 (!) 116/48  Pulse: 72 63 70   Resp: 18 18 18    Temp: 98.3 F (36.8 C) 98.3 F (36.8 C) 98.6 F (37 C)   TempSrc: Oral Oral Oral   SpO2: 97% 90% 94%   Weight:   69.9 kg   Height:        Wt Readings from Last 3 Encounters:  12/10/17 69.9 kg  09/18/17 66.2 kg  08/08/17 68.2 kg     Intake/Output Summary (Last 24 hours) at 12/10/2017 1101 Last data filed at 12/10/2017 0942 Gross per 24 hour  Intake 680 ml  Output 1500 ml  Net -820 ml     Physical Exam  Gen: not in distress, very hard of hearing HEENT:  moist mucosa, supple neck Chest: Diffuse rhonchi bilaterally CVS: S1 and S2 regular, no murmurs rubs or gallop GI: soft, NT, ND, BS+ Musculoskeletal: warm, 1+ pitting edema bilaterally (R >L)     Data Review:    CBC Recent Labs  Lab 12/09/17 1300  WBC 8.4  HGB 9.1*  HCT 29.1*  PLT 169  MCV 86.9  MCH 27.2  MCHC 31.3  RDW 17.4*    Chemistries  Recent Labs  Lab 12/09/17 1300 12/10/17 0915  NA 120* 122*  K 4.6 4.1  CL 84* 86*  CO2 23 25    GLUCOSE 137* 121*  BUN 19 21  CREATININE 1.17 1.30*  CALCIUM 8.4* 8.4*  MG 1.7  --    ------------------------------------------------------------------------------------------------------------------ No results for input(s): CHOL, HDL, LDLCALC, TRIG, CHOLHDL, LDLDIRECT in the last 72 hours.  Lab Results  Component Value Date   HGBA1C 6.0 (H) 05/01/2015   ------------------------------------------------------------------------------------------------------------------ No results for input(s): TSH, T4TOTAL, T3FREE, THYROIDAB in the last 72 hours.  Invalid input(s): FREET3 ------------------------------------------------------------------------------------------------------------------ No results for input(s): VITAMINB12, FOLATE, FERRITIN, TIBC, IRON, RETICCTPCT in the last 72 hours.  Coagulation profile No results for input(s): INR, PROTIME in the last 168 hours.  No results for input(s): DDIMER in the last 72 hours.  Cardiac Enzymes No results for input(s): CKMB, TROPONINI, MYOGLOBIN in the last 168 hours.  Invalid input(s): CK ------------------------------------------------------------------------------------------------------------------    Component Value Date/Time   BNP 2,836.3 (H) 12/09/2017 1300   BNP 340.6 (H) 06/19/2015 1023    Inpatient Medications  Scheduled Meds: . amLODipine  5 mg Oral Daily  . aspirin EC  81 mg Oral Daily  . atorvastatin  20 mg Oral Daily  . carvedilol  6.25 mg Oral BID WC  . clopidogrel  75 mg Oral Daily  . folic acid  3 mg Oral Daily  . furosemide  60 mg Intravenous Q12H  . heparin  5,000 Units Subcutaneous Q8H  . Influenza vac split quadrivalent PF  0.5 mL Intramuscular Tomorrow-1000  . lactose free nutrition  237 mL Oral Daily  . levothyroxine  50 mcg Oral QAC breakfast  . loratadine  10 mg Oral Daily  . losartan  25 mg Oral Daily  . [  START ON 12/15/2017] methotrexate  25 mg Oral Q Fri  . pantoprazole  40 mg Oral Daily  .  potassium chloride  20 mEq Oral BID  . predniSONE  5 mg Oral Q breakfast  . sodium chloride flush  3 mL Intravenous Q12H   Continuous Infusions: . sodium chloride     PRN Meds:.sodium chloride, acetaminophen, fluticasone, nitroGLYCERIN, ondansetron (ZOFRAN) IV, sodium chloride flush  Micro Results No results found for this or any previous visit (from the past 240 hour(s)).  Radiology Reports Dg Chest 2 View  Result Date: 12/09/2017 CLINICAL DATA:  Increased shortness of breath EXAM: CHEST - 2 VIEW COMPARISON:  08/08/2017 FINDINGS: Cardiac shadow remains enlarged. Changes of prior aortic valve replacement are seen. Coronary bypass grafting changes are noted as well. The lungs are well aerated bilaterally. Bibasilar lower lobe opacities are noted likely related to acute infiltrate. No sizable effusion is seen. No bony abnormality is noted. IMPRESSION: Bibasilar infiltrative change Electronically Signed   By: Alcide Clever M.D.   On: 12/09/2017 13:49    Time Spent in minutes  35   Kaitlyn Skowron M.D on 12/10/2017 at 11:01 AM  Between 7am to 7pm - Pager - [phone]  After 7pm go to [email or web address] - password [facility]  [facility] -  Office  [phone]

## 2017-12-10 NOTE — Progress Notes (Signed)
Patient resting comfortably during shift report. Denies complaints.  

## 2017-12-11 LAB — BASIC METABOLIC PANEL
ANION GAP: 14 (ref 5–15)
BUN: 21 mg/dL (ref 8–23)
CHLORIDE: 87 mmol/L — AB (ref 98–111)
CO2: 23 mmol/L (ref 22–32)
Calcium: 8.3 mg/dL — ABNORMAL LOW (ref 8.9–10.3)
Creatinine, Ser: 1.37 mg/dL — ABNORMAL HIGH (ref 0.61–1.24)
GFR calc non Af Amer: 44 mL/min — ABNORMAL LOW (ref >60)
GFR, EST AFRICAN AMERICAN: 51 mL/min — AB (ref >60)
Glucose, Bld: 107 mg/dL — ABNORMAL HIGH (ref 70–99)
Potassium: 4.1 mmol/L (ref 3.5–5.1)
Sodium: 124 mmol/L — ABNORMAL LOW (ref 135–145)

## 2017-12-11 NOTE — Plan of Care (Signed)
  Problem: Education: Goal: Knowledge of General Education information will improve Description Including pain rating scale, medication(s)/side effects and non-pharmacologic comfort measures Outcome: Progressing   Problem: Health Behavior/Discharge Planning: Goal: Ability to manage health-related needs will improve Outcome: Progressing   Problem: Clinical Measurements: Goal: Ability to maintain clinical measurements within normal limits will improve Outcome: Progressing Goal: Will remain free from infection Outcome: Progressing Goal: Diagnostic test results will improve Outcome: Progressing Goal: Respiratory complications will improve Outcome: Progressing Goal: Cardiovascular complication will be avoided Outcome: Progressing   Problem: Activity: Goal: Risk for activity intolerance will decrease Outcome: Progressing   Problem: Nutrition: Goal: Adequate nutrition will be maintained Outcome: Progressing   Problem: Coping: Goal: Level of anxiety will decrease Outcome: Progressing   Problem: Elimination: Goal: Will not experience complications related to bowel motility Outcome: Progressing Goal: Will not experience complications related to urinary retention Outcome: Progressing   Problem: Pain Managment: Goal: General experience of comfort will improve Outcome: Progressing   Problem: Safety: Goal: Ability to remain free from injury will improve Outcome: Progressing   Problem: Skin Integrity: Goal: Risk for impaired skin integrity will decrease Outcome: Progressing   Problem: Activity: Goal: Capacity to carry out activities will improve Outcome: Progressing   Problem: Cardiac: Goal: Ability to achieve and maintain adequate cardiopulmonary perfusion will improve Outcome: Progressing   

## 2017-12-11 NOTE — Progress Notes (Signed)
PROGRESS NOTE                                                                                                                                                                                                             Patient Demographics:    Hunter Choi, is a 84 y.o. male, DOB - 12-Oct-1927, GDJ:[id number]  Admit date - 12/09/2017   Admitting Physician [name], MD  Outpatient Primary MD for the patient is [name], MD  LOS - 2  Outpatient Specialists: cardiolgy  Chief Complaint  Patient presents with  . Shortness of Breath       Brief Narrative 84 year old male with history of CAD status post CABG in 1999, status post TAVR in 2014, chronic, systolic and diastolic CHF with EF of 30-35% as per last echo, CKD stage III, hypothyroidism, GERD and severe hard of hearing presented to the ED with increasing shortness of breath for past 2 days or more symptoms gradually worsening and associated with orthopnea and bilateral lower leg edema (R > L).  Patient found to have pulmonary edema on chest x-ray and severe hyponatremia with sodium of 120. Admitted for acute on chronic combined systolic and diastolic CHF.   Subjective:   Patient hearing much better today after hearing aid applied.  Reports breathing much better since admission.   Assessment  & Plan :    Principal Problem:   Acute on chronic combined systolic and diastolic congestive heart failure, NYHA class 4 (HCC) Continue IV Lasix 60 mg every 12 hours. Diuresis (-3.8 L since admission).  Strict I's/O and daily weight.  Continue aspirin, Coreg, losartan.  Strict fluid restriction of 1200 cc.  Continue potassium supplement.   Active Problems: Hyponatremia Likely hypervolemic hyponatremia with fluid overload and excessive water intake.  Continue strict fluid restriction and monitor with IV Lasix.  Sodium slowly improving.     CKD (chronic  kidney disease) stage 3, GFR 30-59 ml/min (HCC) At baseline.  Monitor with aggressive diuresis.  CAD with history of CABG/status post TAVR Continue aspirin, beta-blocker and Plavix.  Paroxysmal A. fib Rate controlled.  Not on anticoagulation.  Continue aspirin and beta-blocker.  Hypothyroidism Continue Synthroid  GERD Continue PPI     Code Status : Full code  Family Communication  : We will update his son.  Disposition Plan  : Home  pending clinical improvement of his CHF and hyponatremia.  Possibly in the next 48 hours.  PT evaluation  Barriers For Discharge : Active symptoms  Consults  : None  Procedures  : None  DVT Prophylaxis  : Subcu heparin  Lab Results  Component Value Date   PLT 169 12/09/2017    Antibiotics  :    Anti-infectives (From admission, onward)   None        Objective:   Vitals:   12/11/17 0000 12/11/17 0354 12/11/17 0932 12/11/17 1139  BP: 107/69 124/64 123/70 112/79  Pulse: 66 61 66 65  Resp: 20 18    Temp: 98.2 F (36.8 C) 98.2 F (36.8 C)  98.7 F (37.1 C)  TempSrc: Oral Oral  Oral  SpO2: 97% 92%  96%  Weight:  69.5 kg    Height:        Wt Readings from Last 3 Encounters:  12/11/17 69.5 kg  09/18/17 66.2 kg  08/08/17 68.2 kg     Intake/Output Summary (Last 24 hours) at 12/11/2017 1419 Last data filed at 12/11/2017 1137 Gross per 24 hour  Intake 485 ml  Output 2351 ml  Net -1866 ml    Physical exam Not in distress HEENT: Moist mucosa, supple neck Chest: Fine bibasilar crackles CVS: S1-S2 regular, no murmurs GI: Soft, nondistended, nontender Musculoskeletal: Warm, trace pitting edema bilaterally    Data Review:    CBC Recent Labs  Lab 12/09/17 1300  WBC 8.4  HGB 9.1*  HCT 29.1*  PLT 169  MCV 86.9  MCH 27.2  MCHC 31.3  RDW 17.4*    Chemistries  Recent Labs  Lab 12/09/17 1300 12/10/17 0915 12/10/17 1628 12/11/17 0838  NA 120* 122* 123* 124*  K 4.6 4.1 4.1 4.1  CL 84* 86* 85* 87*  CO2 23 25  25 23   GLUCOSE 137* 121* 128* 107*  BUN 19 21 21 21   CREATININE 1.17 1.30* 1.27* 1.37*  CALCIUM 8.4* 8.4* 8.2* 8.3*  MG 1.7  --   --   --    ------------------------------------------------------------------------------------------------------------------ No results for input(s): CHOL, HDL, LDLCALC, TRIG, CHOLHDL, LDLDIRECT in the last 72 hours.  Lab Results  Component Value Date   HGBA1C 6.0 (H) 05/01/2015   ------------------------------------------------------------------------------------------------------------------ No results for input(s): TSH, T4TOTAL, T3FREE, THYROIDAB in the last 72 hours.  Invalid input(s): FREET3 ------------------------------------------------------------------------------------------------------------------ No results for input(s): VITAMINB12, FOLATE, FERRITIN, TIBC, IRON, RETICCTPCT in the last 72 hours.  Coagulation profile No results for input(s): INR, PROTIME in the last 168 hours.  No results for input(s): DDIMER in the last 72 hours.  Cardiac Enzymes No results for input(s): CKMB, TROPONINI, MYOGLOBIN in the last 168 hours.  Invalid input(s): CK ------------------------------------------------------------------------------------------------------------------    Component Value Date/Time   BNP 2,836.3 (H) 12/09/2017 1300   BNP 340.6 (H) 06/19/2015 1023    Inpatient Medications  Scheduled Meds: . amLODipine  5 mg Oral Daily  . aspirin EC  81 mg Oral Daily  . atorvastatin  20 mg Oral Daily  . carvedilol  6.25 mg Oral BID WC  . clopidogrel  75 mg Oral Daily  . folic acid  3 mg Oral Daily  . furosemide  60 mg Intravenous Q12H  . heparin  5,000 Units Subcutaneous Q8H  . lactose free nutrition  237 mL Oral Daily  . levothyroxine  50 mcg Oral QAC breakfast  . loratadine  10 mg Oral Daily  . losartan  25 mg Oral Daily  . [START ON 12/15/2017] methotrexate  25 mg Oral Q Fri  . pantoprazole  40 mg Oral Daily  . potassium chloride  20 mEq  Oral BID  . predniSONE  5 mg Oral Q breakfast  . sodium chloride flush  3 mL Intravenous Q12H   Continuous Infusions: . sodium chloride     PRN Meds:.sodium chloride, acetaminophen, fluticasone, nitroGLYCERIN, ondansetron (ZOFRAN) IV, sodium chloride flush  Micro Results No results found for this or any previous visit (from the past 240 hour(s)).  Radiology Reports Dg Chest 2 View  Result Date: 12/09/2017 CLINICAL DATA:  Increased shortness of breath EXAM: CHEST - 2 VIEW COMPARISON:  08/08/2017 FINDINGS: Cardiac shadow remains enlarged. Changes of prior aortic valve replacement are seen. Coronary bypass grafting changes are noted as well. The lungs are well aerated bilaterally. Bibasilar lower lobe opacities are noted likely related to acute infiltrate. No sizable effusion is seen. No bony abnormality is noted. IMPRESSION: Bibasilar infiltrative change Electronically Signed   By: Alcide Clever M.D.   On: 12/09/2017 13:49    Time Spent in minutes  25   Shunte Senseney M.D on 12/11/2017 at 2:19 PM  Between 7am to 7pm - Pager - [phone]  After 7pm go to [email or web address] - password [facility]  [facility] -  Office  [phone]

## 2017-12-12 DIAGNOSIS — I493 Ventricular premature depolarization: Secondary | ICD-10-CM

## 2017-12-12 LAB — BLOOD GAS, ARTERIAL
ACID-BASE EXCESS: 3.9 mmol/L — AB (ref 0.0–2.0)
BICARBONATE: 27.1 mmol/L (ref 20.0–28.0)
Drawn by: 257081
O2 CONTENT: 21 L/min
O2 SAT: 94.7 %
PATIENT TEMPERATURE: 98.6
pCO2 arterial: 34.8 mmHg (ref 32.0–48.0)
pH, Arterial: 7.502 — ABNORMAL HIGH (ref 7.350–7.450)
pO2, Arterial: 69.3 mmHg — ABNORMAL LOW (ref 83.0–108.0)

## 2017-12-12 LAB — URINALYSIS, ROUTINE W REFLEX MICROSCOPIC
BILIRUBIN URINE: NEGATIVE
Glucose, UA: NEGATIVE mg/dL
Ketones, ur: NEGATIVE mg/dL
Nitrite: NEGATIVE
PH: 7 (ref 5.0–8.0)
Protein, ur: NEGATIVE mg/dL
SPECIFIC GRAVITY, URINE: 1.008 (ref 1.005–1.030)

## 2017-12-12 LAB — BASIC METABOLIC PANEL
ANION GAP: 12 (ref 5–15)
BUN: 25 mg/dL — AB (ref 8–23)
CALCIUM: 8.4 mg/dL — AB (ref 8.9–10.3)
CO2: 25 mmol/L (ref 22–32)
Chloride: 88 mmol/L — ABNORMAL LOW (ref 98–111)
Creatinine, Ser: 1.5 mg/dL — ABNORMAL HIGH (ref 0.61–1.24)
GFR calc Af Amer: 46 mL/min — ABNORMAL LOW (ref >60)
GFR calc non Af Amer: 40 mL/min — ABNORMAL LOW (ref >60)
GLUCOSE: 85 mg/dL (ref 70–99)
POTASSIUM: 4.3 mmol/L (ref 3.5–5.1)
SODIUM: 125 mmol/L — AB (ref 135–145)

## 2017-12-12 LAB — AMMONIA: AMMONIA: 50 umol/L — AB (ref 9–35)

## 2017-12-12 LAB — MAGNESIUM: MAGNESIUM: 1.8 mg/dL (ref 1.7–2.4)

## 2017-12-12 MED ORDER — MAGNESIUM SULFATE 2 GM/50ML IV SOLN
2.0000 g | Freq: Once | INTRAVENOUS | Status: AC
  Administered 2017-12-12: 2 g via INTRAVENOUS
  Filled 2017-12-12: qty 50

## 2017-12-12 MED ORDER — FUROSEMIDE 40 MG PO TABS
40.0000 mg | ORAL_TABLET | Freq: Two times a day (BID) | ORAL | Status: DC
  Administered 2017-12-12 – 2017-12-14 (×4): 40 mg via ORAL
  Filled 2017-12-12 (×4): qty 1

## 2017-12-12 MED ORDER — SODIUM CHLORIDE 1 G PO TABS
1.0000 g | ORAL_TABLET | Freq: Two times a day (BID) | ORAL | Status: DC
  Administered 2017-12-12 – 2017-12-15 (×7): 1 g via ORAL
  Filled 2017-12-12 (×7): qty 1

## 2017-12-12 NOTE — Progress Notes (Signed)
Patient noted to be drowsy all day. He was arousable and answering few questions but was mumbling and would fall asleep and snore.  Will check ABG, ammonia and UA.

## 2017-12-12 NOTE — Progress Notes (Addendum)
PROGRESS NOTE                                                                                                                                                                                                             Patient Demographics:    Hunter Choi, is a 84 y.o. male, DOB - 08/24/1927, TXM:[id number]  Admit date - 12/09/2017   Admitting Physician [name], MD  Outpatient Primary MD for the patient is [name], MD  LOS - 3  Outpatient Specialists: cardiolgy  Chief Complaint  Patient presents with  . Shortness of Breath       Brief Narrative 84 year old male with history of CAD status post CABG in 1999, status post TAVR in 2014, chronic, systolic and diastolic CHF with EF of 30-35% as per last echo, CKD stage III, hypothyroidism, GERD and severe hard of hearing presented to the ED with increasing shortness of breath for past 2 days or more symptoms gradually worsening and associated with orthopnea and bilateral lower leg edema (R > L).  Patient found to have pulmonary edema on chest x-ray and severe hyponatremia with sodium of 120. Admitted for acute on chronic combined systolic and diastolic CHF.   Subjective:   patient somnolent on exam. Multiple PVCs noted on monitor. No overnight events   Assessment  & Plan :    Principal Problem:   Acute on chronic combined systolic and diastolic congestive heart failure, NYHA class 4 (HCC)  Diuresis (-5.6 L since admission).  Switch Lasix to p.o. 40 mg twice daily as renal function worsening.  Maintain strict I's/O and daily weight.  Continue aspirin, Coreg, hold losartan as renal function worsening.  Strict fluid restriction of 1200 cc.  Continue potassium supplement.   Active Problems: Hyponatremia Likely hypervolemic hyponatremia with fluid overload and excessive water intake.  Continue strict fluid restriction and monitor with IV Lasix.  Sodium  slowly improving but still low.  Will add salt tablets.  Acute kidney injury on CKD (chronic kidney disease) stage 3, GFR 30-59 ml/min (HCC) Secondary to diuresis.  Switched Lasix to p.o.  Hold losartan.  Monitor in a.m.  CAD with history of CABG/status post TAVR Continue aspirin, beta-blocker and Plavix.  Paroxysmal A. fib Rate controlled.  Not on anticoagulation.  Continue aspirin and beta-blocker.  PVCs Potassium normal.  Replenish magnesium.  Hypothyroidism Continue Synthroid  GERD Continue PPI     Code Status : Full code  Family Communication  : Discussed with son on the phone.  The son informs me that patient lives with him and he is there all day with the patient.  Disposition Plan  : Home possibly in the next 24-48 hours if mood symptoms and sodium level improving.  Pending PT eval.   Barriers For Discharge : Active symptoms  Consults  : None  Procedures  : None  DVT Prophylaxis  : Subcu heparin  Lab Results  Component Value Date   PLT 169 12/09/2017    Antibiotics  :    Anti-infectives (From admission, onward)   None        Objective:   Vitals:   12/11/17 1139 12/11/17 2026 12/12/17 0435 12/12/17 0941  BP: 112/79 109/61 (!) 97/50 (!) 104/57  Pulse: 65 63 63 60  Resp:  18 19   Temp: 98.7 F (37.1 C) 98.3 F (36.8 C) 98.6 F (37 C)   TempSrc: Oral Oral Oral   SpO2: 96% 95% 95% 97%  Weight:   66.9 kg   Height:        Wt Readings from Last 3 Encounters:  12/12/17 66.9 kg  09/18/17 66.2 kg  08/08/17 68.2 kg     Intake/Output Summary (Last 24 hours) at 12/12/2017 1012 Last data filed at 12/12/2017 0634 Gross per 24 hour  Intake 3 ml  Output 2508 ml  Net -2505 ml   Physical exam Elderly male, sleepy HEENT: Moist, supple neck Chest: Clear to auscultation bilaterally CVS: Normal S1-S2, no murmurs GI: Soft, nondistended, nontender Musculoskeletal: Warm, improved edema     Data Review:    CBC Recent Labs  Lab 12/09/17 1300    WBC 8.4  HGB 9.1*  HCT 29.1*  PLT 169  MCV 86.9  MCH 27.2  MCHC 31.3  RDW 17.4*    Chemistries  Recent Labs  Lab 12/09/17 1300 12/10/17 0915 12/10/17 1628 12/11/17 0838 12/12/17 0601  NA 120* 122* 123* 124* 125*  K 4.6 4.1 4.1 4.1 4.3  CL 84* 86* 85* 87* 88*  CO2 23 25 25 23 25   GLUCOSE 137* 121* 128* 107* 85  BUN 19 21 21 21  25*  CREATININE 1.17 1.30* 1.27* 1.37* 1.50*  CALCIUM 8.4* 8.4* 8.2* 8.3* 8.4*  MG 1.7  --   --   --  1.8   ------------------------------------------------------------------------------------------------------------------ No results for input(s): CHOL, HDL, LDLCALC, TRIG, CHOLHDL, LDLDIRECT in the last 72 hours.  Lab Results  Component Value Date   HGBA1C 6.0 (H) 05/01/2015   ------------------------------------------------------------------------------------------------------------------ No results for input(s): TSH, T4TOTAL, T3FREE, THYROIDAB in the last 72 hours.  Invalid input(s): FREET3 ------------------------------------------------------------------------------------------------------------------ No results for input(s): VITAMINB12, FOLATE, FERRITIN, TIBC, IRON, RETICCTPCT in the last 72 hours.  Coagulation profile No results for input(s): INR, PROTIME in the last 168 hours.  No results for input(s): DDIMER in the last 72 hours.  Cardiac Enzymes No results for input(s): CKMB, TROPONINI, MYOGLOBIN in the last 168 hours.  Invalid input(s): CK ------------------------------------------------------------------------------------------------------------------    Component Value Date/Time   BNP 2,836.3 (H) 12/09/2017 1300   BNP 340.6 (H) 06/19/2015 1023    Inpatient Medications  Scheduled Meds: . amLODipine  5 mg Oral Daily  . aspirin EC  81 mg Oral Daily  . atorvastatin  20 mg Oral Daily  . carvedilol  6.25 mg Oral BID WC  . clopidogrel  75 mg Oral Daily  .  folic acid  3 mg Oral Daily  . furosemide  40 mg Oral BID  .  heparin  5,000 Units Subcutaneous Q8H  . lactose free nutrition  237 mL Oral Daily  . levothyroxine  50 mcg Oral QAC breakfast  . loratadine  10 mg Oral Daily  . losartan  25 mg Oral Daily  . [START ON 12/15/2017] methotrexate  25 mg Oral Q Fri  . pantoprazole  40 mg Oral Daily  . potassium chloride  20 mEq Oral BID  . predniSONE  5 mg Oral Q breakfast  . sodium chloride flush  3 mL Intravenous Q12H  . sodium chloride  1 g Oral BID WC   Continuous Infusions: . sodium chloride    . magnesium sulfate 1 - 4 g bolus IVPB     PRN Meds:.sodium chloride, acetaminophen, fluticasone, nitroGLYCERIN, ondansetron (ZOFRAN) IV, sodium chloride flush  Micro Results No results found for this or any previous visit (from the past 240 hour(s)).  Radiology Reports Dg Chest 2 View  Result Date: 12/09/2017 CLINICAL DATA:  Increased shortness of breath EXAM: CHEST - 2 VIEW COMPARISON:  08/08/2017 FINDINGS: Cardiac shadow remains enlarged. Changes of prior aortic valve replacement are seen. Coronary bypass grafting changes are noted as well. The lungs are well aerated bilaterally. Bibasilar lower lobe opacities are noted likely related to acute infiltrate. No sizable effusion is seen. No bony abnormality is noted. IMPRESSION: Bibasilar infiltrative change Electronically Signed   By: Alcide Clever M.D.   On: 12/09/2017 13:49    Time Spent in minutes  25   Aeryn Medici M.D on 12/12/2017 at 10:12 AM  Between 7am to 7pm - Pager - [phone]  After 7pm go to [email or web address] - password [facility]  [facility] -  Office  [phone]

## 2017-12-12 NOTE — Progress Notes (Signed)
Pt is now awake enough to eat.  Son fed 25% of meal and I was able to give some of daily medications crushed in apple sauce.  Let patient sit up above 30 degrees afterward

## 2017-12-12 NOTE — Progress Notes (Addendum)
Pt is still too drowsy to give oral medications.  Will respond to voice, but will fall back asleep during conversation

## 2017-12-12 NOTE — Progress Notes (Addendum)
Pt will not wake up enough to take oral  Medications.  Patient wakes up with painful stimuli, but then goes right back to sleep.   MD aware.  Respiratory rate & O2 are WNL

## 2017-12-12 NOTE — Progress Notes (Signed)
PT Cancellation Note  Patient Details Name: Hunter Choi MRN: [id number] DOB: 03/06/1928   Cancelled Treatment:    Reason Eval/Treat Not Completed: Patient's level of consciousness  Made second attempt to work with patient, he remains very lethargic, awakens briefly to Max tactile stimulation but returns right back to sleep. Unable to rouse patient enough to participate in PT. Repositioned patient in bed with totalA and left with bed alarm activated, all needs otherwise met.    Deniece Ree PT, DPT, CBIS  Supplemental Physical Therapist [facility]    Pager [phone] Acute Rehab Office [phone]

## 2017-12-12 NOTE — Care Management Important Message (Signed)
Important Message  Patient Details  Name: Hunter Choi MRN: [id number] Date of Birth: Jun 07, 1927   Medicare Important Message Given:  Yes Patient asleep, unsigned copy left at bedside    Orson Aloe 12/12/2017, 4:23 PM

## 2017-12-12 NOTE — Plan of Care (Signed)

## 2017-12-12 NOTE — Progress Notes (Signed)
PT Cancellation Note  Patient Details Name: Hunter Choi MRN: [id number] DOB: 1927/07/12   Cancelled Treatment:    Reason Eval/Treat Not Completed: Patient's level of consciousness Patient soundly asleep in bed, snoring loudly. Unable to wake patient for PT despite Max verbal and tactile stimulation, patient briefly opens eyes but continues sleeping. Unable to awaken patient enough for participation in PT evaluation, RN aware. If time and schedule allow, will attempt to try to check back in PM, otherwise will attempt on next day of service.    Nedra Hai PT, DPT, CBIS  Supplemental Physical Therapist [facility]    Pager [phone] Acute Rehab Office [phone]

## 2017-12-12 NOTE — Progress Notes (Addendum)
Pt is still very drowsy;  Is not awake enough to safely take medications.  Continue to monitor pt.

## 2017-12-13 DIAGNOSIS — N183 Chronic kidney disease, stage 3 (moderate): Secondary | ICD-10-CM

## 2017-12-13 DIAGNOSIS — E871 Hypo-osmolality and hyponatremia: Secondary | ICD-10-CM

## 2017-12-13 DIAGNOSIS — I5043 Acute on chronic combined systolic (congestive) and diastolic (congestive) heart failure: Secondary | ICD-10-CM

## 2017-12-13 LAB — BASIC METABOLIC PANEL
Anion gap: 16 — ABNORMAL HIGH (ref 5–15)
BUN: 30 mg/dL — AB (ref 8–23)
CO2: 22 mmol/L (ref 22–32)
CREATININE: 1.46 mg/dL — AB (ref 0.61–1.24)
Calcium: 8.4 mg/dL — ABNORMAL LOW (ref 8.9–10.3)
Chloride: 88 mmol/L — ABNORMAL LOW (ref 98–111)
GFR calc Af Amer: 48 mL/min — ABNORMAL LOW (ref >60)
GFR, EST NON AFRICAN AMERICAN: 41 mL/min — AB (ref >60)
GLUCOSE: 94 mg/dL (ref 70–99)
POTASSIUM: 5 mmol/L (ref 3.5–5.1)
SODIUM: 126 mmol/L — AB (ref 135–145)

## 2017-12-13 NOTE — Evaluation (Signed)
Physical Therapy Evaluation Patient Details Name: Hunter Choi MRN: [id number] DOB: 03/19/1928 Today's Date: 12/13/2017   History of Present Illness  84 year old male with history of CAD status post CABG in 1999, status post TAVR in 2014, chronic, systolic and diastolic CHF with EF of 30-35% as per last echo, CKD stage III, hypothyroidism, GERD and severe hard of hearing presented to the ED with increasing shortness of breath for past 2 days or more symptoms gradually worsening and associated with orthopnea and bilateral lower leg edema (R > L).    Clinical Impression  Pt admitted with above diagnosis. Pt currently with functional limitations due to the deficits listed below (see PT Problem List). PTA, pt reports living at home with 2 sons, household ambulating with RW and receiving assistance with ADLs. Upon eval pt presents with weakness and deconditioning. Unable to safely pivot to chair at this time without having near falls back onto bed while providing hands on assistance. No family present to discern level of assistance at home, may require SNF placement if level of assistance at home is not feasible. Pt will benefit from skilled PT to increase their independence and safety with mobility to allow discharge to the venue listed below.       Follow Up Recommendations SNF;Supervision/Assistance - 24 hour    Equipment Recommendations  None recommended by PT(TBD next venue)    Recommendations for Other Services       Precautions / Restrictions Precautions Precautions: Fall Restrictions Weight Bearing Restrictions: No      Mobility  Bed Mobility Overal bed mobility: Needs Assistance Bed Mobility: Supine to Sit;Sit to Supine     Supine to sit: Mod assist Sit to supine: Mod assist      Transfers Overall transfer level: Needs assistance Equipment used: Rolling walker (2 wheeled) Transfers: Sit to/from Stand Sit to Stand: Min assist         General transfer comment: Min  A to stand, unable to pivot fell back into bed several times even with mod A  Ambulation/Gait             General Gait Details: unable to safely this visit.  Stairs            Wheelchair Mobility    Modified Rankin (Stroke Patients Only)       Balance Overall balance assessment: Needs assistance   Sitting balance-Leahy Scale: Fair       Standing balance-Leahy Scale: Poor                               Pertinent Vitals/Pain Pain Assessment: Faces Pain Location: R side from prior fall Pain Descriptors / Indicators: Discomfort Pain Intervention(s): Limited activity within patient's tolerance;Monitored during session    Home Living Family/patient expects to be discharged to:: Private residence Living Arrangements: Children Available Help at Discharge: Family;Available 24 hours/day Type of Home: House Home Access: Level entry     Home Layout: One level Home Equipment: Walker - 4 wheels      Prior Function Level of Independence: Needs assistance   Gait / Transfers Assistance Needed: household ambulation with RW  ADL's / Homemaking Assistance Needed: sons assist with ADLs per patient         Hand Dominance        Extremity/Trunk Assessment   Upper Extremity Assessment Upper Extremity Assessment: Generalized weakness    Lower Extremity Assessment Lower Extremity Assessment: Generalized weakness  Communication   Communication: HOH  Cognition Arousal/Alertness: Awake/alert Behavior During Therapy: WFL for tasks assessed/performed Overall Cognitive Status: Within Functional Limits for tasks assessed                                        General Comments      Exercises     Assessment/Plan    PT Assessment Patient needs continued PT services  PT Problem List Decreased range of motion;Decreased strength;Decreased activity tolerance;Decreased balance;Decreased knowledge of use of DME;Decreased safety  awareness;Decreased cognition;Decreased mobility       PT Treatment Interventions DME instruction;Gait training;Stair training;Functional mobility training;Therapeutic exercise;Balance training;Therapeutic activities    PT Goals (Current goals can be found in the Care Plan section)  Acute Rehab PT Goals Patient Stated Goal: to rest PT Goal Formulation: With patient Time For Goal Achievement: 12/20/17 Potential to Achieve Goals: Fair    Frequency Min 3X/week   Barriers to discharge        Co-evaluation               AM-PAC PT "6 Clicks" Daily Activity  Outcome Measure Difficulty turning over in bed (including adjusting bedclothes, sheets and blankets)?: Unable Difficulty moving from lying on back to sitting on the side of the bed? : Unable Difficulty sitting down on and standing up from a chair with arms (e.g., wheelchair, bedside commode, etc,.)?: Unable Help needed moving to and from a bed to chair (including a wheelchair)?: A Lot Help needed walking in hospital room?: Total Help needed climbing 3-5 steps with a railing? : Total 6 Click Score: 7    End of Session Equipment Utilized During Treatment: Gait belt Activity Tolerance: Patient limited by fatigue Patient left: in bed;with call bell/phone within reach Nurse Communication: Mobility status PT Visit Diagnosis: Unsteadiness on feet (R26.81);Pain;Difficulty in walking, not elsewhere classified (R26.2) Pain - Right/Left: Right Pain - part of body: (trunk)    Time: 1510-1536 PT Time Calculation (min) (ACUTE ONLY): 26 min   Charges:   PT Evaluation $PT Eval Moderate Complexity: 1 Mod PT Treatments $Therapeutic Activity: 8-22 mins        Etta Grandchild, PT, DPT Acute Rehabilitation Services Pager: [phone] Office: [phone]    Etta Grandchild 12/13/2017, 4:00 PM

## 2017-12-13 NOTE — Progress Notes (Signed)
PROGRESS NOTE                                                                                                                                                                                                             Patient Demographics:    Hunter Choi, is a 84 y.o. male, DOB - January 25, 1928, BWL:[id number]  Admit date - 12/09/2017   Admitting Physician [name], MD  Outpatient Primary MD for the patient is [name], MD  LOS - 4  Outpatient Specialists: cardiolgy  Chief Complaint  Patient presents with  . Shortness of Breath       Brief Narrative 84 year old male with history of CAD status post CABG in 1999, status post TAVR in 2014, chronic, systolic and diastolic CHF with EF of 30-35% as per last echo, CKD stage III, hypothyroidism, GERD and severe hard of hearing presented to the ED with increasing shortness of breath for past 2 days or more symptoms gradually worsening and associated with orthopnea and bilateral lower leg edema (R > L).  Patient found to have pulmonary edema on chest x-ray and severe hyponatremia with sodium of 120. Admitted for acute on chronic combined systolic and diastolic CHF.   Subjective:   Extremely hard of hearing.  States that he feels better.  Dyspnea improved and breathing almost back to baseline.  Denies chest pain or cough.   Assessment  & Plan :    Principal Problem:   Acute on chronic combined systolic and diastolic congestive heart failure, NYHA class 4 (HCC)  Diuresis (-7.3 L since admission).  Initially treated with IV Lasix and then on 9/17 switched Lasix to p.o. 40 mg twice daily as renal function was worsening.  Maintain strict I's/O and daily weight.  Continue aspirin, Coreg, holding losartan as renal function was worsening.  Strict fluid restriction of 1200 cc.  Hold potassium supplements since potassium is high normal.  Improving.   Active  Problems: Hyponatremia Likely hypervolemic hyponatremia with fluid overload and excessive water intake.  Continue strict fluid restriction and monitor with Lasix.  Slow to improve but steadily doing in the right direction.  Sodium was 120 on 9/14 which has gradually improved to 126 on 9/18.  Salt tablets were added.  Continue current management.  Asymptomatic.  Follow BMP in a.m.  Acute kidney injury on CKD (chronic  kidney disease) stage 3, GFR 30-59 ml/min (HCC) Secondary to diuresis.  Switched Lasix to p.o.  Hold losartan.  Baseline creatinine is not known, fluctuating creatinine, was 1.17 on 9/14 which gradually peaked to 1.5 on 9/17.  Slightly better at 1.46 today.  Follow BMP in a.m.  CAD with history of CABG/status post TAVR Continue aspirin, beta-blocker and Plavix.  No anginal symptoms.  Paroxysmal A. fib Rate controlled.  Not on anticoagulation.  Continue aspirin and beta-blocker.  Currently in SP-SR.  PVCs Potassium normal.  Magnesium 1.8.   Hypothyroidism Continue Synthroid  GERD Continue PPI  Normocytic anemia Follow CBC in a.m.     Code Status : Full code  Family Communication  : None at bedside today.  Disposition Plan  : Home possibly in the next 24 hours pending further improvement in creatinine and sodium.  PT recommends 24/7 supervision versus SNF, discussed with son in a.m. Barriers For Discharge : Active symptoms  Consults  : None  Procedures  : None  DVT Prophylaxis  : Subcu heparin  Lab Results  Component Value Date   PLT 169 12/09/2017    Antibiotics  :    Anti-infectives (From admission, onward)   None        Objective:   Vitals:   12/12/17 0941 12/12/17 1945 12/13/17 0604 12/13/17 1146  BP: (!) 104/57 (!) 90/45 (!) 117/56 (!) 104/50  Pulse: 60 (!) 54 (!) 58 (!) 53  Resp: 20 18 16 18   Temp:  98.2 F (36.8 C) (!) 97.4 F (36.3 C) 98.4 F (36.9 C)  TempSrc:  Oral Oral Oral  SpO2: 97% 96% 97% 97%  Weight:   64.9 kg   Height:         Wt Readings from Last 3 Encounters:  12/13/17 64.9 kg  09/18/17 66.2 kg  08/08/17 68.2 kg     Intake/Output Summary (Last 24 hours) at 12/13/2017 1900 Last data filed at 12/13/2017 1400 Gross per 24 hour  Intake 240 ml  Output 1450 ml  Net -1210 ml   Physical exam Pleasant elderly male, moderately built and nourished, lying comfortably propped up in bed without distress. Chest: Clear to auscultation bilaterally except occasional left basal crackles.  No increased work of breathing. CVS: S1 and S2 heard, RRR.  No JVD, murmurs or pedal edema.  Telemetry personally reviewed: SB in the 50s-SR in the 60s with BBB morphology. GI: Abdomen is nondistended, soft and nontender.  Normal bowel sounds heard. CNS: Alert and oriented x2.  Answers questions appropriately and follows instructions appropriately.  Extremely hard of hearing and has hearing aid in right ear.     Data Review:    CBC Recent Labs  Lab 12/09/17 1300  WBC 8.4  HGB 9.1*  HCT 29.1*  PLT 169  MCV 86.9  MCH 27.2  MCHC 31.3  RDW 17.4*    Chemistries  Recent Labs  Lab 12/09/17 1300 12/10/17 0915 12/10/17 1628 12/11/17 0838 12/12/17 0601 12/13/17 0633  NA 120* 122* 123* 124* 125* 126*  K 4.6 4.1 4.1 4.1 4.3 5.0  CL 84* 86* 85* 87* 88* 88*  CO2 23 25 25 23 25 22   GLUCOSE 137* 121* 128* 107* 85 94  BUN 19 21 21 21  25* 30*  CREATININE 1.17 1.30* 1.27* 1.37* 1.50* 1.46*  CALCIUM 8.4* 8.4* 8.2* 8.3* 8.4* 8.4*  MG 1.7  --   --   --  1.8  --    ------------------------------------------------------------------------------------------------------------------   Lab Results  Component  Value Date   HGBA1C 6.0 (H) 05/01/2015  ------------------------------------------------------------------------------------------------------------------    Component Value Date/Time   BNP 2,836.3 (H) 12/09/2017 1300   BNP 340.6 (H) 06/19/2015 1023    Inpatient Medications  Scheduled Meds: . amLODipine  5 mg Oral  Daily  . aspirin EC  81 mg Oral Daily  . atorvastatin  20 mg Oral Daily  . carvedilol  6.25 mg Oral BID WC  . clopidogrel  75 mg Oral Daily  . folic acid  3 mg Oral Daily  . furosemide  40 mg Oral BID  . heparin  5,000 Units Subcutaneous Q8H  . lactose free nutrition  237 mL Oral Daily  . levothyroxine  50 mcg Oral QAC breakfast  . loratadine  10 mg Oral Daily  . [START ON 12/15/2017] methotrexate  25 mg Oral Q Fri  . pantoprazole  40 mg Oral Daily  . predniSONE  5 mg Oral Q breakfast  . sodium chloride flush  3 mL Intravenous Q12H  . sodium chloride  1 g Oral BID WC   Continuous Infusions: . sodium chloride Stopped (12/12/17 1500)   PRN Meds:.sodium chloride, acetaminophen, fluticasone, nitroGLYCERIN, ondansetron (ZOFRAN) IV, sodium chloride flush  Micro Results No results found for this or any previous visit (from the past 240 hour(s)).  Radiology Reports Dg Chest 2 View  Result Date: 12/09/2017 CLINICAL DATA:  Increased shortness of breath EXAM: CHEST - 2 VIEW COMPARISON:  08/08/2017 FINDINGS: Cardiac shadow remains enlarged. Changes of prior aortic valve replacement are seen. Coronary bypass grafting changes are noted as well. The lungs are well aerated bilaterally. Bibasilar lower lobe opacities are noted likely related to acute infiltrate. No sizable effusion is seen. No bony abnormality is noted. IMPRESSION: Bibasilar infiltrative change Electronically Signed   By: Alcide Clever M.D.   On: 12/09/2017 13:49    Time Spent in minutes  25   Marcellus Scott, MD, [place], [facility]. [facility] Pager [phone]  If 7PM-7AM, please contact night-coverage [email or web address] Password [email or web address] 12/13/2017, 7:07 PM

## 2017-12-14 ENCOUNTER — Inpatient Hospital Stay (HOSPITAL_COMMUNITY): Payer: Medicare Other

## 2017-12-14 DIAGNOSIS — N179 Acute kidney failure, unspecified: Secondary | ICD-10-CM

## 2017-12-14 DIAGNOSIS — N189 Chronic kidney disease, unspecified: Secondary | ICD-10-CM

## 2017-12-14 LAB — BASIC METABOLIC PANEL
ANION GAP: 9 (ref 5–15)
BUN: 40 mg/dL — ABNORMAL HIGH (ref 8–23)
CHLORIDE: 91 mmol/L — AB (ref 98–111)
CO2: 27 mmol/L (ref 22–32)
Calcium: 8.3 mg/dL — ABNORMAL LOW (ref 8.9–10.3)
Creatinine, Ser: 1.76 mg/dL — ABNORMAL HIGH (ref 0.61–1.24)
GFR calc non Af Amer: 33 mL/min — ABNORMAL LOW (ref >60)
GFR, EST AFRICAN AMERICAN: 38 mL/min — AB (ref >60)
Glucose, Bld: 104 mg/dL — ABNORMAL HIGH (ref 70–99)
POTASSIUM: 4.3 mmol/L (ref 3.5–5.1)
SODIUM: 127 mmol/L — AB (ref 135–145)

## 2017-12-14 LAB — CBC
HEMATOCRIT: 30.7 % — AB (ref 39.0–52.0)
HEMOGLOBIN: 9.6 g/dL — AB (ref 13.0–17.0)
MCH: 26.9 pg (ref 26.0–34.0)
MCHC: 31.3 g/dL (ref 30.0–36.0)
MCV: 86 fL (ref 78.0–100.0)
PLATELETS: 193 10*3/uL (ref 150–400)
RBC: 3.57 MIL/uL — AB (ref 4.22–5.81)
RDW: 17.2 % — ABNORMAL HIGH (ref 11.5–15.5)
WBC: 7.9 10*3/uL (ref 4.0–10.5)

## 2017-12-14 LAB — MAGNESIUM: Magnesium: 2.2 mg/dL (ref 1.7–2.4)

## 2017-12-14 NOTE — NC FL2 (Signed)
[place] MEDICAID FL2 LEVEL OF CARE SCREENING TOOL     IDENTIFICATION  Patient Name: Hunter Choi Birthdate: 09/08/1927 Sex: male Admission Date (Current Location): 12/09/2017  [facility] and [place] Number:  Producer, television/film/video and Address:  The . [facility], [address], [place], [place] [address]      Provider Number: [id number]  Attending Physician Name and Address:  Elease Etienne, MD  Relative Name and Phone Number:  [name], son, [phone]    Current Level of Care: Hospital Recommended Level of Care: Skilled Nursing Facility Prior Approval Number:    Date Approved/Denied:   PASRR Number: [id number]  Discharge Plan: SNF    Current Diagnoses: Patient Active Problem List   Diagnosis Date Noted  . CKD (chronic kidney disease) stage 3, GFR 30-59 ml/min (HCC) 12/09/2017  . Acute on chronic systolic CHF (congestive heart failure), NYHA class 4 (HCC) 12/09/2017  . SIRS (systemic inflammatory response syndrome) (HCC) 08/03/2017  . Essential hypertension 08/03/2017  . Elevated troponin   . Sepsis due to urinary tract infection (HCC) 10/02/2015  . Hard of hearing   . Pressure ulcer 01/28/2015  . Acute on chronic combined systolic and diastolic congestive heart failure, NYHA class 4 (HCC) 01/27/2015  . Hyponatremia 11/21/2014  . Elevated LFTs 11/14/2014  . Anemia 05/23/2014  . Thrombocytopenia (HCC) 02/21/2014  . Malnutrition of moderate degree (HCC) 02/21/2014  . Occlusion and stenosis of carotid artery without mention of cerebral infarction 09/04/2013  . Aftercare following surgery of the circulatory system, NEC 09/04/2013  . AAA (abdominal aortic aneurysm) without rupture (HCC) 03/06/2013  . 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 Coronary artery disease involving coronary bypass graft of native heart with angina pectoris (HCC)   . GERD (gastroesophageal reflux disease)   . Thyroid disease   . Embolism involving  retinal artery   . Iliac artery aneurysm (HCC)   . Rheumatoid arthritis (HCC) 04/22/2010    Orientation RESPIRATION BLADDER Height & Weight     Self, Place  Normal Incontinent, External catheter Weight: 66.3 kg(pt did not want to stand) Height:  5\' 7"  (170.2 cm)  BEHAVIORAL SYMPTOMS/MOOD NEUROLOGICAL BOWEL NUTRITION STATUS      Incontinent Diet(Please see DC Summary)  AMBULATORY STATUS COMMUNICATION OF NEEDS Skin   Extensive Assist Verbally Normal                       Personal Care Assistance Level of Assistance  Bathing, Feeding, Dressing Bathing Assistance: Maximum assistance Feeding assistance: Limited assistance Dressing Assistance: Limited assistance     Functional Limitations Info  Hearing, Sight, Speech Sight Info: Impaired Hearing Info: Impaired Speech Info: Adequate    SPECIAL CARE FACTORS FREQUENCY  PT (By licensed PT), OT (By licensed OT)     PT Frequency: 5x/week OT Frequency: 3x/week            Contractures Contractures Info: Not present    Additional Factors Info  Code Status, Allergies Code Status Info: Full Allergies Info: Ciprofloxacin           Current Medications (12/14/2017):  This is the current hospital active medication list Current Facility-Administered Medications  Medication Dose Route Frequency Provider Last Rate Last Dose  . 0.9 %  sodium chloride infusion  250 mL Intravenous PRN 12/16/2017, MD   Stopped at 12/12/17 1500  . acetaminophen (TYLENOL) tablet 650 mg  650 mg Oral Q4H PRN 12/14/17, MD      .  amLODipine (NORVASC) tablet 5 mg  5 mg Oral Daily [name], MD   5 mg at 12/14/17 0845  . aspirin EC tablet 81 mg  81 mg Oral Daily [name], MD   81 mg at 12/14/17 3300  . atorvastatin (LIPITOR) tablet 20 mg  20 mg Oral Daily [name], MD   20 mg at 12/14/17 0844  . carvedilol (COREG) tablet 6.25 mg  6.25 mg Oral BID WC [name], MD   6.25 mg at 12/14/17 0843  .  clopidogrel (PLAVIX) tablet 75 mg  75 mg Oral Daily [name], MD   75 mg at 12/14/17 0844  . fluticasone (FLONASE) 50 MCG/ACT nasal spray 1 spray  1 spray Each Nare PRN [name], MD      . folic acid (FOLVITE) tablet 3 mg  3 mg Oral Daily [name], MD   3 mg at 12/14/17 0841  . furosemide (LASIX) tablet 40 mg  40 mg Oral BID Dhungel, Nishant, MD   40 mg at 12/14/17 0845  . heparin injection 5,000 Units  5,000 Units Subcutaneous Q8H [name], MD   5,000 Units at 12/14/17 [phone]  . lactose free nutrition (Boost) liquid 237 mL  237 mL Oral Daily [name], MD   237 mL at 12/12/17 1825  . levothyroxine (SYNTHROID, LEVOTHROID) tablet 50 mcg  50 mcg Oral QAC breakfast [name], MD   50 mcg at 12/14/17 [phone]  . loratadine (CLARITIN) tablet 10 mg  10 mg Oral Daily [name], MD   10 mg at 12/14/17 0846  . [START ON 12/15/2017] methotrexate (RHEUMATREX) tablet 25 mg  25 mg Oral Q Fri Sheehan, Theresa C, MD      . nitroGLYCERIN (NITROSTAT) SL tablet 0.4 mg  0.4 mg Sublingual Q5 min PRN [name], MD      . ondansetron [facility]) injection 4 mg  4 mg Intravenous Q6H PRN [name], MD      . pantoprazole (PROTONIX) EC tablet 40 mg  40 mg Oral Daily [name], MD   40 mg at 12/14/17 0845  . predniSONE (DELTASONE) tablet 5 mg  5 mg Oral Q breakfast [name], MD   5 mg at 12/14/17 0842  . sodium chloride flush (NS) 0.9 % injection 3 mL  3 mL Intravenous Q12H [name], MD   3 mL at 12/13/17 2121  . sodium chloride flush (NS) 0.9 % injection 3 mL  3 mL Intravenous PRN [name], MD      . sodium chloride tablet 1 g  1 g Oral BID WC Dhungel, Nishant, MD   1 g at 12/14/17 0846     Discharge Medications: Please see discharge summary for a list of discharge medications.  Relevant Imaging Results:  Relevant Lab Results:   Additional Information [id number]  Mearl Latin, LCSWA

## 2017-12-14 NOTE — Progress Notes (Signed)
PROGRESS NOTE                                                                                                                                                                                                             Patient Demographics:    Hunter Choi, is a 84 y.o. male, DOB - 1927-12-03, YQI:[id number]  Admit date - 12/09/2017   Admitting Physician [name], MD  Outpatient Primary MD for the patient is [name], MD  LOS - 5  Outpatient Specialists: cardiolgy  Chief Complaint  Patient presents with  . Shortness of Breath       Brief Narrative 84 year old male with history of CAD status post CABG in 1999, status post TAVR in 2014, chronic, systolic and diastolic CHF with EF of 30-35% as per last echo, CKD stage III, hypothyroidism, GERD and severe hard of hearing presented to the ED with increasing shortness of breath for past 2 days or more symptoms gradually worsening and associated with orthopnea and bilateral lower leg edema (R > L).  Patient found to have pulmonary edema on chest x-ray and severe hyponatremia with sodium of 120. Admitted for acute on chronic combined systolic and diastolic CHF.   Subjective:   States that he has stooled in bed and needs to be cleaned this morning.  Denies nausea, vomiting or abdominal pain.  Indicates that his breathing is almost back to baseline.  As per RN, watery diarrhea x1 this morning.  4 stools charted yesterday but no details regarding consistency.   Assessment  & Plan :    Principal Problem:   Acute on chronic combined systolic and diastolic congestive heart failure, NYHA class 4 (HCC)  Diuresis (-7.1 L since admission).  Initially treated with IV Lasix and then on 9/17 switched Lasix to p.o. 40 mg twice daily as renal function was worsening.  Maintain strict I's/O and daily weight.  Continue aspirin, Coreg, holding losartan as renal function was  worsening.  Strict fluid restriction of 1200 cc.  Hold potassium supplements since potassium is high normal.  Clinically appears euvolemic.   Active Problems: Hyponatremia Likely hypervolemic hyponatremia with fluid overload and excessive water intake.  Continue strict fluid restriction and monitor with Lasix.  Slow to improve but steadily doing in the right direction.  Sodium was 120 on 9/14 which has gradually improved  to 127 on 9/19.  Salt tablets were added.  Continue current management.  Asymptomatic.  Due to worsening creatinine today from 1.46-1.76, hold further Lasix.  Received a.m. dose already.  Acute kidney injury on CKD (chronic kidney disease) stage 3, GFR 30-59 ml/min (HCC) Secondary to diuresis.  Switched Lasix to p.o.  Hold losartan.  Baseline creatinine is not known, fluctuating creatinine, was 1.17 on 9/14 which gradually peaked to 1.5 on 9/17.  Creatinine today has gone up from 1.46-1.76.  Hold further Lasix.  Follow BMP in a.m.  Check renal ultrasound.  Check bladder scan.  CAD with history of CABG/status post TAVR Continue aspirin, beta-blocker and Plavix.  No anginal symptoms.  Paroxysmal A. fib Rate controlled.  Not on anticoagulation.  Continue aspirin and beta-blocker.  Currently in sinus rhythm.  PVCs Potassium normal.  Magnesium 1.8.   Hypothyroidism Continue Synthroid  GERD Continue PPI  Normocytic anemia Stable.  ?  Diarrhea: Discussed with RN to monitor and document frequency and consistency of stools.  Low index of suspicion for C. difficile.   Code Status : Full code  Family Communication  : None at bedside today.  Disposition Plan  : DC to SNF pending clinical improvement and insurance approval.  Barriers For Discharge : Active symptoms  Consults  : None  Procedures  : None  DVT Prophylaxis  : Subcu heparin  Lab Results  Component Value Date   PLT 193 12/14/2017    Antibiotics  :    Anti-infectives (From admission, onward)   None         Objective:   Vitals:   12/13/17 2034 12/14/17 0513 12/14/17 1004 12/14/17 1204  BP: (!) 98/53 118/60 (!) 99/50 (!) 109/52  Pulse: (!) 52 60 (!) 56 (!) 57  Resp: 18 16 16 16   Temp: 97.7 F (36.5 C) 97.7 F (36.5 C) 98 F (36.7 C) 98.2 F (36.8 C)  TempSrc: Oral Oral Oral Oral  SpO2: 99% 97% 100% 100%  Weight:  66.3 kg    Height:        Wt Readings from Last 3 Encounters:  12/14/17 66.3 kg  09/18/17 66.2 kg  08/08/17 68.2 kg     Intake/Output Summary (Last 24 hours) at 12/14/2017 1532 Last data filed at 12/14/2017 1250 Gross per 24 hour  Intake 680 ml  Output 850 ml  Net -170 ml   Physical exam Pleasant elderly male, moderately built and nourished, lying comfortably propped up in bed without distress.  Does not appear in any distress. Chest: Clear to auscultation bilaterally except occasional left basal crackles.  No increased work of breathing.  Stable. CVS: S1 and S2 heard, RRR.  No JVD, murmurs or pedal edema.  Telemetry personally reviewed: SR.  Occasional transient NS SVT. GI: Abdomen is nondistended, soft and nontender.  Normal bowel sounds heard.  Stable. CNS: Alert and oriented x2.  Answers questions appropriately and follows instructions appropriately.  Extremely hard of hearing and has hearing aid in right ear.  Stable. Extremities: Moves all limbs symmetrically and well.     Data Review:    CBC Recent Labs  Lab 12/09/17 1300 12/14/17 0539  WBC 8.4 7.9  HGB 9.1* 9.6*  HCT 29.1* 30.7*  PLT 169 193  MCV 86.9 86.0  MCH 27.2 26.9  MCHC 31.3 31.3  RDW 17.4* 17.2*    Chemistries  Recent Labs  Lab 12/09/17 1300  12/10/17 1628 12/11/17 0838 12/12/17 0601 12/13/17 0633 12/14/17 0539  NA 120*   < >  123* 124* 125* 126* 127*  K 4.6   < > 4.1 4.1 4.3 5.0 4.3  CL 84*   < > 85* 87* 88* 88* 91*  CO2 23   < > 25 23 25 22 27   GLUCOSE 137*   < > 128* 107* 85 94 104*  BUN 19   < > 21 21 25* 30* 40*  CREATININE 1.17   < > 1.27* 1.37* 1.50* 1.46*  1.76*  CALCIUM 8.4*   < > 8.2* 8.3* 8.4* 8.4* 8.3*  MG 1.7  --   --   --  1.8  --  2.2   < > = values in this interval not displayed.   ------------------------------------------------------------------------------------------------------------------   Lab Results  Component Value Date   HGBA1C 6.0 (H) 05/01/2015  ------------------------------------------------------------------------------------------------------------------    Component Value Date/Time   BNP 2,836.3 (H) 12/09/2017 1300   BNP 340.6 (H) 06/19/2015 1023    Inpatient Medications  Scheduled Meds: . amLODipine  5 mg Oral Daily  . aspirin EC  81 mg Oral Daily  . atorvastatin  20 mg Oral Daily  . carvedilol  6.25 mg Oral BID WC  . clopidogrel  75 mg Oral Daily  . folic acid  3 mg Oral Daily  . furosemide  40 mg Oral BID  . heparin  5,000 Units Subcutaneous Q8H  . lactose free nutrition  237 mL Oral Daily  . levothyroxine  50 mcg Oral QAC breakfast  . loratadine  10 mg Oral Daily  . [START ON 12/15/2017] methotrexate  25 mg Oral Q Fri  . pantoprazole  40 mg Oral Daily  . predniSONE  5 mg Oral Q breakfast  . sodium chloride flush  3 mL Intravenous Q12H  . sodium chloride  1 g Oral BID WC   Continuous Infusions: . sodium chloride Stopped (12/12/17 1500)   PRN Meds:.sodium chloride, acetaminophen, fluticasone, nitroGLYCERIN, ondansetron (ZOFRAN) IV, sodium chloride flush  Micro Results No results found for this or any previous visit (from the past 240 hour(s)).  Radiology Reports Dg Chest 2 View  Result Date: 12/09/2017 CLINICAL DATA:  Increased shortness of breath EXAM: CHEST - 2 VIEW COMPARISON:  08/08/2017 FINDINGS: Cardiac shadow remains enlarged. Changes of prior aortic valve replacement are seen. Coronary bypass grafting changes are noted as well. The lungs are well aerated bilaterally. Bibasilar lower lobe opacities are noted likely related to acute infiltrate. No sizable effusion is seen. No bony  abnormality is noted. IMPRESSION: Bibasilar infiltrative change Electronically Signed   By: 08/10/2017 M.D.   On: 12/09/2017 13:49    Time Spent in minutes  25   12/11/2017, MD, [place], [facility]. [facility] Pager [phone]  If 7PM-7AM, please contact night-coverage [email or web address] Password [facility] 12/14/2017, 3:32 PM

## 2017-12-14 NOTE — Progress Notes (Signed)
[facility] will begin insurance authorization process for SNF. CSW will alert MD if authorization is received today.   Osborne Casco Chastin Riesgo LCSW [phone]

## 2017-12-14 NOTE — Care Management Note (Signed)
Case Management Note  Patient Details  Name: DWAN HEMMELGARN MRN: [id number] Date of Birth: [birth year]  Subjective/Objective:     CHF              Action/Plan: CM talked to patient at the bedside, he is very [facility]; patient is agreeable to SNF placement at this time; SW made aware;  Expected Discharge Date: possibly 12/15/2017            Expected Discharge Plan:  Skilled Nursing Facility  Discharge planning Services  CM Consult  Choice offered to:  Patient    Status of Service:  In process, will continue to follow  Reola Mosher [phone] 12/14/2017, 11:26 AM

## 2017-12-14 NOTE — Clinical Social Work Note (Signed)
Clinical Social Work Assessment  Patient Details  Name: Hunter Choi MRN: [id number] Date of Birth: 08-26-1927  Date of referral:  12/14/17               Reason for consult:  Facility Placement                Permission sought to share information with:  Facility Medical sales representative, Family Supports Permission granted to share information::  No  Name::     Biomedical scientist::  SNFs  Relationship::  Son  Contact Information:  [phone]  Housing/Transportation Living arrangements for the past 2 months:  Single Family Home Source of Information:  Adult Children Patient Interpreter Needed:  None Criminal Activity/Legal Involvement Pertinent to Current Situation/Hospitalization:  No - Comment as needed Significant Relationships:  Adult Children Lives with:  Self Do you feel safe going back to the place where you live?  No Need for family participation in patient care:  Yes (Comment)  Care giving concerns:  CSW received consult for possible SNF placement at time of discharge. CSW spoke with patient's son regarding PT recommendation of SNF placement at time of discharge. Patient's son reported that patient becomes very weak and has been to SNF earlier in the year. Patient's son expressed understanding of PT recommendation and is agreeable to SNF placement at time of discharge. CSW to continue to follow and assist with discharge planning needs.   Social Worker assessment / plan:  CSW spoke with patient's son concerning possibility of rehab at [facility] before returning home.  Employment status:  Retired Database administrator PT Recommendations:  Skilled Nursing Facility Information / Referral to community resources:  Skilled Nursing Facility  Patient/Family's Response to care:  Patient's son recognizes need for rehab before returning home and is agreeable to a SNF in [place]. Patient's reported preference for [facility] since patient went there in May of  this year and really liked the rehab program. CSW explained the insurance authorization process for SNF.   Patient/Family's Understanding of and Emotional Response to Diagnosis, Current Treatment, and Prognosis:  Patient/family is realistic regarding therapy needs and expressed being hopeful for SNF placement. Patient's son expressed understanding of CSW role and discharge process as well as medical condition. No questions/concerns about plan or treatment.    Emotional Assessment Appearance:  Appears stated age Attitude/Demeanor/Rapport:  Unable to Assess Affect (typically observed):  Unable to Assess Orientation:  Oriented to Self, Oriented to Place Alcohol / Substance use:  Not Applicable Psych involvement (Current and /or in the community):  No (Comment)  Discharge Needs  Concerns to be addressed:  Care Coordination Readmission within the last 30 days:  No Current discharge risk:  None Barriers to Discharge:  Continued Medical Work up   [employer], LCSWA 12/14/2017, 11:48 AM

## 2017-12-14 NOTE — Progress Notes (Signed)
Pt bladder scan per order. Bladder scan revealed 86mL. No distention, abdomen soft.

## 2017-12-15 LAB — BASIC METABOLIC PANEL
ANION GAP: 14 (ref 5–15)
BUN: 39 mg/dL — ABNORMAL HIGH (ref 8–23)
CO2: 25 mmol/L (ref 22–32)
Calcium: 8.8 mg/dL — ABNORMAL LOW (ref 8.9–10.3)
Chloride: 93 mmol/L — ABNORMAL LOW (ref 98–111)
Creatinine, Ser: 1.51 mg/dL — ABNORMAL HIGH (ref 0.61–1.24)
GFR calc non Af Amer: 39 mL/min — ABNORMAL LOW (ref >60)
GFR, EST AFRICAN AMERICAN: 46 mL/min — AB (ref >60)
GLUCOSE: 115 mg/dL — AB (ref 70–99)
POTASSIUM: 4.3 mmol/L (ref 3.5–5.1)
Sodium: 132 mmol/L — ABNORMAL LOW (ref 135–145)

## 2017-12-15 MED ORDER — FUROSEMIDE 20 MG PO TABS: 40.0000 mg | ORAL_TABLET | Freq: Every day | ORAL | Status: DC

## 2017-12-15 NOTE — Discharge Summary (Signed)
Physician Discharge Summary  Hunter Choi FYT:[id number] DOB: 16-Dec-1927  PCP: [name], MD  Admit date: 12/09/2017 Discharge date: 12/15/2017  Recommendations for Outpatient Follow-up:  1. MD at SNF in 3 days with repeat labs (CBC & BMP). 2. Dr. Verdis Prime, Cardiology in 2 weeks. 3. Dr. [name], PCP upon discharge from SNF.  Home Health: Patient being discharged to [facility], SNF. Equipment/Devices: None  Discharge Condition: Improved and stable CODE STATUS: Full Diet recommendation: Heart healthy diet.  Discharge Diagnoses:  Principal Problem:   Acute on chronic combined systolic and diastolic congestive heart failure, NYHA class 4 (HCC) Active Problems:   GERD (gastroesophageal reflux disease)   Thyroid disease   S/P TAVR (transcatheter aortic valve replacement)   Hyponatremia   Hard of hearing   CKD (chronic kidney disease) stage 3, GFR 30-59 ml/min (HCC)   Acute on chronic systolic CHF (congestive heart failure), NYHA class 4 (HCC)   Brief Summary: 84 year old male with history of CAD status post CABG in 1999, status post TAVR in 2014, chronic, systolic and diastolic CHF with EF of 30-35% as per last echo, CKD stage III, hypothyroidism, GERD and severe hard of hearing presented to the ED with increasing shortness of breath for past 2 days or more symptoms gradually worsening and associated with orthopnea and bilateral lower leg edema (R > L).  Patient found to have pulmonary edema on chest x-ray and severe hyponatremia with sodium of 120. Admitted for acute on chronic combined systolic and diastolic CHF.  Assessment and plan:  Principal Problem:   Acute on chronic combined systolic and diastolic congestive heart failure, NYHA class 4 (HCC)  Diuresis (- 8.3 L since admission).  Initially treated with IV Lasix and then on 9/17 switched Lasix to p.o. 40 mg twice daily as renal function was worsening. Continue aspirin, Coreg.  Losartan  was discontinued due to worsening renal function.  Due to worsening creatinine, held evening dose of Lasix yesterday, creatinine has improved.  Ongoing brisk diuresis.  At discharge, continue reduced dose of Lasix 40 mg daily.  Monitor closely at SNF and adjust Lasix dose as needed.  Currently appears compensated/euvolemic.   Active Problems: Hyponatremia Likely hypervolemic hyponatremia with fluid overload and excessive water intake.  This was treated with fluid restriction, Lasix diuresis and salt tablets.  This was slow to improve but has finally improved from 120 to 132 after several days.  Has been asymptomatic.  Discontinued salt tablets at discharge.  Continue Lasix.  Follow BMP in a couple days as outpatient.  Acute kidney injury on CKD (chronic kidney disease) stage 3, GFR 30-59 ml/min (HCC) Secondary to diuresis.  Switched Lasix to p.o. Discontinued losartan.  Baseline creatinine is not known, fluctuating creatinine, was 1.17 on 9/14 which gradually peaked to 1.7 on 9/19.    Likely related to aggressive diuresis.  Evening dose of Lasix temporarily held yesterday.  Bladder scan did not show urinary retention.  Renal ultrasound showed no hydronephrosis.  Creatinine has improved to 1.5.  Resumed Lasix for CHF at reduced dose of 40 mg daily.  Follow BMP closely at SNF.  If creatinine improves and stabilizes, may consider resuming ARB with close monitoring.  CAD with history of CABG/status post TAVR Continue aspirin, beta-blocker and Plavix.  No anginal symptoms.  Paroxysmal A. fib Rate controlled.  Not on anticoagulation.  Continue aspirin, Plavix and beta-blocker.  Currently in sinus rhythm.  PVCs Potassium normal.  Magnesium 1.8.  Improved.   Hypothyroidism Continue Synthroid  GERD Continue PPI  Normocytic anemia Stable.  Chronic methotrexate, prednisone and folate use:?  For underlying rheumatoid  arthritis.  Consultations:  None  Procedures:  None   Discharge Instructions  Discharge Instructions    (HEART FAILURE PATIENTS) Call MD:  Anytime you have any of the following symptoms: 1) 3 pound weight gain in 24 hours or 5 pounds in 1 week 2) shortness of breath, with or without a dry hacking cough 3) swelling in the hands, feet or stomach 4) if you have to sleep on extra pillows at night in order to breathe.   Complete by:  As directed    Call MD for:  difficulty breathing, headache or visual disturbances   Complete by:  As directed    Call MD for:  extreme fatigue   Complete by:  As directed    Call MD for:  persistant dizziness or light-headedness   Complete by:  As directed    Diet - low sodium heart healthy   Complete by:  As directed    Increase activity slowly   Complete by:  As directed        Medication List    STOP taking these medications   losartan 25 MG tablet Commonly known as:  COZAAR     TAKE these medications   acetaminophen 325 MG tablet Commonly known as:  TYLENOL Take 2 tablets (650 mg total) by mouth every 6 (six) hours as needed for mild pain, moderate pain, fever or headache. What changed:    how much to take  when to take this   amLODipine 5 MG tablet Commonly known as:  NORVASC Take 5 mg by mouth daily.   aspirin 81 MG EC tablet Take 1 tablet (81 mg total) by mouth daily.   atorvastatin 20 MG tablet Commonly known as:  LIPITOR Take 20 mg by mouth daily.   carvedilol 6.25 MG tablet Commonly known as:  COREG Take 1 tablet (6.25 mg total) by mouth 2 (two) times daily with a meal.   clopidogrel 75 MG tablet Commonly known as:  PLAVIX Take 1 tablet (75 mg total) by mouth daily.   fluticasone 50 MCG/ACT nasal spray Commonly known as:  FLONASE Place 1 spray into both nostrils as needed.   folic acid 1 MG tablet Commonly known as:  FOLVITE Take 3 mg by mouth daily.   furosemide 20 MG tablet Commonly known as:  LASIX Take  2 tablets (40 mg total) by mouth daily. What changed:  when to take this   lactose free nutrition Liqd Take 237 mLs daily by mouth.   levothyroxine 50 MCG tablet Commonly known as:  SYNTHROID, LEVOTHROID Take 50 mcg by mouth daily.   loratadine 10 MG tablet Commonly known as:  CLARITIN Take 10 mg by mouth daily.   methotrexate 2.5 MG tablet Take 25 mg by mouth every Friday.   nitroGLYCERIN 0.4 MG SL tablet Commonly known as:  NITROSTAT PLACE 1 TABLET (0.4 MG TOTAL) UNDER THE TONGUE EVERY 5 MINUTES AS NEEDED FOR CHEST PAIN What changed:  See the new instructions.   pantoprazole 40 MG tablet Commonly known as:  PROTONIX Take 1 tablet (40 mg total) by mouth daily.   predniSONE 5 MG tablet Commonly known as:  DELTASONE Take 5 mg by mouth daily.       Contact information for follow-up providers    MD at SNF. Schedule an appointment as soon as possible for a visit in 3 day(s).   Why:  To be seen with repeat labs (CBC & BMP).       [name], MD. Schedule an appointment as soon as possible for a visit.   Specialty:  Internal Medicine Why:  Upon discharge from SNF. Contact information: [address] [place] [address] [phone]        Lyn Records, MD. Schedule an appointment as soon as possible for a visit in 2 week(s).   Specialty:  Cardiology Contact information: [address] [place] [address] [phone]            Contact information for after-discharge care    Destination    [facility] Preferred SNF .   Service:  Skilled Nursing Contact information: [address] [place] [address] [phone]                 Allergies  Allergen Reactions  . Ciprofloxacin Other (See Comments)    REACTION: mild delirium      Procedures/Studies: Dg Chest 2 View  Result Date: 12/09/2017 CLINICAL DATA:  Increased shortness of breath EXAM: CHEST - 2 VIEW COMPARISON:   08/08/2017 FINDINGS: Cardiac shadow remains enlarged. Changes of prior aortic valve replacement are seen. Coronary bypass grafting changes are noted as well. The lungs are well aerated bilaterally. Bibasilar lower lobe opacities are noted likely related to acute infiltrate. No sizable effusion is seen. No bony abnormality is noted. IMPRESSION: Bibasilar infiltrative change Electronically Signed   By: Alcide Clever M.D.   On: 12/09/2017 13:49   US Renal  Result Date: 12/14/2017 CLINICAL DATA:  Initial evaluation for acute kidney injury. EXAM: RENAL / URINARY TRACT ULTRASOUND COMPLETE COMPARISON:  Prior CT from 08/04/2017 FINDINGS: Right Kidney: Length: 9.3 cm. Echogenicity within normal limits. No mass or hydronephrosis visualized. Left Kidney: Length: 9.5 cm. Echogenicity within normal limits. No mass or hydronephrosis visualized. Bladder: Bladder walls are diffusely irregular and thickened with trabeculated margins, similar relative to previous CT. IMPRESSION: 1. Normal sonographic appearance of the kidneys, no hydronephrosis. 2. Irregular bladder wall thickening with trabeculated margins, grossly stable from previous CT. Electronically Signed   By: Rise Mu M.D.   On: 12/14/2017 22:29      Subjective: No further diarrhea.  Denies complaints.  No chest pain, dyspnea, abdominal pain, nausea, vomiting or diarrhea.  As per RN, no acute issues reported.  Discharge Exam:  Vitals:   12/14/17 1204 12/14/17 1607 12/14/17 1929 12/15/17 0437  BP: (!) 109/52 (!) 110/54 (!) 99/48 129/62  Pulse: (!) 57 (!) 57 (!) 56 82  Resp: 16 14 16 16   Temp: 98.2 F (36.8 C) 98.8 F (37.1 C) 98.4 F (36.9 C) 98.3 F (36.8 C)  TempSrc: Oral Oral Oral Oral  SpO2: 100% 97% 94% 90%  Weight:      Height:        Pleasant elderly male, moderately built and nourished, lying comfortably propped up in bed without distress.  Does not appear in any distress. Chest: Clear to auscultation.  No increased work of  breathing.  CVS: S1 and S2 heard, RRR.  No JVD, murmurs or pedal edema.    Telemetry personally reviewed: SB in the 50s-SR with BBB morphology. GI: Abdomen is nondistended, soft and nontender.  Normal bowel sounds heard. . CNS: Alert and oriented x2.  Answers questions appropriately and follows instructions appropriately.  Extremely hard of hearing and has hearing aid in right ear.   Extremities: Moves all limbs symmetrically and well.  The results of significant diagnostics from this hospitalization (including imaging, microbiology, ancillary and laboratory) are listed below for reference.      Labs: CBC: Recent Labs  Lab 12/09/17 1300 12/14/17 0539  WBC 8.4 7.9  HGB 9.1* 9.6*  HCT 29.1* 30.7*  MCV 86.9 86.0  PLT 169 193   Basic Metabolic Panel: Recent Labs  Lab 12/09/17 1300  12/11/17 0838 12/12/17 0601 12/13/17 0633 12/14/17 0539 12/15/17 0635  NA 120*   < > 124* 125* 126* 127* 132*  K 4.6   < > 4.1 4.3 5.0 4.3 4.3  CL 84*   < > 87* 88* 88* 91* 93*  CO2 23   < > 23 25 22 27 25   GLUCOSE 137*   < > 107* 85 94 104* 115*  BUN 19   < > 21 25* 30* 40* 39*  CREATININE 1.17   < > 1.37* 1.50* 1.46* 1.76* 1.51*  CALCIUM 8.4*   < > 8.3* 8.4* 8.4* 8.3* 8.8*  MG 1.7  --   --  1.8  --  2.2  --    < > = values in this interval not displayed.   BNP (last 3 results) Recent Labs    02/13/17 0522 08/03/17 2010 12/09/17 1300  BNP 1,875.3* 2,896.1* 2,836.3*   Urinalysis    Component Value Date/Time   COLORURINE YELLOW 12/12/2017 1911   APPEARANCEUR CLEAR 12/12/2017 1911   LABSPEC 1.008 12/12/2017 1911   PHURINE 7.0 12/12/2017 1911   GLUCOSEU NEGATIVE 12/12/2017 1911   HGBUR SMALL (A) 12/12/2017 1911   BILIRUBINUR NEGATIVE 12/12/2017 1911   KETONESUR NEGATIVE 12/12/2017 1911   PROTEINUR NEGATIVE 12/12/2017 1911   UROBILINOGEN 4.0 (H) 11/14/2014 1849   NITRITE NEGATIVE 12/12/2017 1911   LEUKOCYTESUR LARGE (A) 12/12/2017 1911    I unsuccessfully attempted to reach  patient's son via phone to update care.  Time coordinating discharge: 40 minutes  SIGNED:  12/14/2017, MD, FACP, [facility]. [facility] Pager [phone]  If 7PM-7AM, please contact night-coverage [email or web address] Password [facility] 12/15/2017, 3:42 PM

## 2017-12-15 NOTE — Clinical Social Work Placement (Addendum)
Nurse to call report to [phone], Room 118      CLINICAL SOCIAL WORK PLACEMENT  NOTE  Date:  12/15/2017  Patient Details  Name: Hunter Choi MRN: [id number] Date of Birth: 1927/05/02  Clinical Social Work is seeking post-discharge placement for this patient at the Skilled  Nursing Facility level of care (*CSW will initial, date and re-position this form in  chart as items are completed):  Yes   Patient/family provided with [place] Clinical Social Work Department's list of facilities offering this level of care within the geographic area requested by the patient (or if unable, by the patient's family).  Yes   Patient/family informed of their freedom to choose among providers that offer the needed level of care, that participate in Medicare, Medicaid or managed care program needed by the patient, have an available bed and are willing to accept the patient.  Yes   Patient/family informed of [facility]'s ownership interest in [facility] and [facility], as well as of the fact that they are under no obligation to receive care at these facilities.  PASRR submitted to EDS on       PASRR number received on       Existing PASRR number confirmed on 12/14/17     FL2 transmitted to all facilities in geographic area requested by pt/family on 12/14/17     FL2 transmitted to all facilities within larger geographic area on       Patient informed that his/her managed care company has contracts with or will negotiate with certain facilities, including the following:        Yes   Patient/family informed of bed offers received.  Patient chooses bed at [facility]     Physician recommends and patient chooses bed at      Patient to be transferred to [facility] on 12/15/17.  Patient to be transferred to facility by [facility]     Patient family notified on 12/15/17 of transfer.  Name of family member notified:  Son     PHYSICIAN Please sign FL2      Additional Comment:    _______________________________________________ Baldemar Lenis, LCSW 12/15/2017, 3:57 PM

## 2017-12-15 NOTE — Progress Notes (Signed)
This patient is not able to ambulate. He is a 2 person full lift/assist just to the [facility].

## 2017-12-15 NOTE — Progress Notes (Signed)
Patient discharging to facility this PM. All personal belongings with patient. Discharge info, AVS with patient to facility.   Patient displays no distress at this time.  Report called to facility.

## 2017-12-15 NOTE — Care Management Important Message (Signed)
Important Message  Patient Details  Name: Hunter Choi MRN: [id number] Date of Birth: 12-Aug-1927   Medicare Important Message Given:  Yes    Jaaron Oleson P Spence Soberano 12/15/2017, 4:22 PM

## 2017-12-15 NOTE — Plan of Care (Signed)
  Problem: Education: Goal: Knowledge of General Education information will improve Description Including pain rating scale, medication(s)/side effects and non-pharmacologic comfort measures Outcome: Progressing Note:  POC reviewed with pt.   

## 2017-12-15 NOTE — Discharge Instructions (Signed)

## 2017-12-15 NOTE — Progress Notes (Signed)
Physical Therapy Treatment Patient Details Name: Hunter Choi MRN: [id number] DOB: 09/04/1927 Today's Date: 12/15/2017    History of Present Illness 83 year old male with history of CAD status post CABG in 1999, status post TAVR in 2014, chronic, systolic and diastolic CHF with EF of 30-35% as per last echo, CKD stage III, hypothyroidism, GERD and severe hard of hearing presented to the ED with increasing shortness of breath for past 2 days or more symptoms gradually worsening and associated with orthopnea and bilateral lower leg edema (R > L).    PT Comments    Patient with mild progress this visit, less assistance for bed mobility and standing, refusing transfer training but wiling to side step along bed x3 with contact guard.  Pt plans to d/c to SNF, agree with plan.    Follow Up Recommendations  SNF;Supervision/Assistance - 24 hour     Equipment Recommendations  (TBD next venue)    Recommendations for Other Services       Precautions / Restrictions Precautions Precautions: Fall Restrictions Weight Bearing Restrictions: No    Mobility  Bed Mobility Overal bed mobility: Needs Assistance Bed Mobility: Supine to Sit;Sit to Supine     Supine to sit: Min assist Sit to supine: Min assist      Transfers Overall transfer level: Needs assistance Equipment used: Rolling walker (2 wheeled) Transfers: Sit to/from Stand Sit to Stand: Min assist         General transfer comment: Min A to stand, unable to pivot fell back into bed several times even with mod A  Ambulation/Gait   Gait Distance (Feet): 2 Feet Assistive device: Rolling walker (2 wheeled) Gait Pattern/deviations: Step-to pattern Gait velocity: decreased   General Gait Details: side stepping along bed for safety. x3 sit to stands with side steps    Stairs             Wheelchair Mobility    Modified Rankin (Stroke Patients Only)       Balance Overall balance assessment: Needs assistance    Sitting balance-Leahy Scale: Fair       Standing balance-Leahy Scale: Poor                              Cognition Arousal/Alertness: Awake/alert Behavior During Therapy: WFL for tasks assessed/performed Overall Cognitive Status: Within Functional Limits for tasks assessed                                        Exercises      General Comments        Pertinent Vitals/Pain Pain Assessment: No/denies pain    Home Living                      Prior Function            PT Goals (current goals can now be found in the care plan section) Acute Rehab PT Goals Patient Stated Goal: to rest PT Goal Formulation: With patient Time For Goal Achievement: 12/20/17 Potential to Achieve Goals: Fair Progress towards PT goals: Progressing toward goals    Frequency    Min 3X/week      PT Plan Current plan remains appropriate    Co-evaluation              AM-PAC PT "6 Clicks" Daily Activity  Outcome  Measure  Difficulty turning over in bed (including adjusting bedclothes, sheets and blankets)?: Unable Difficulty moving from lying on back to sitting on the side of the bed? : Unable Difficulty sitting down on and standing up from a chair with arms (e.g., wheelchair, bedside commode, etc,.)?: Unable Help needed moving to and from a bed to chair (including a wheelchair)?: A Lot Help needed walking in hospital room?: Total Help needed climbing 3-5 steps with a railing? : Total 6 Click Score: 7    End of Session Equipment Utilized During Treatment: Gait belt Activity Tolerance: Patient limited by fatigue Patient left: in bed;with call bell/phone within reach Nurse Communication: Mobility status PT Visit Diagnosis: Unsteadiness on feet (R26.81);Pain;Difficulty in walking, not elsewhere classified (R26.2) Pain - Right/Left: Right     Time: 1550-1605 PT Time Calculation (min) (ACUTE ONLY): 15 min  Charges:  $Therapeutic Activity:  8-22 mins                     Etta Grandchild, PT, DPT Acute Rehabilitation Services Pager: [phone] Office: [phone]     Etta Grandchild 12/15/2017, 4:13 PM

## 2018-02-19 ENCOUNTER — Other Ambulatory Visit: Payer: Self-pay | Admitting: Interventional Cardiology

## 2018-03-12 ENCOUNTER — Ambulatory Visit (INDEPENDENT_AMBULATORY_CARE_PROVIDER_SITE_OTHER): Payer: Medicare Other | Admitting: Nurse Practitioner

## 2018-03-12 ENCOUNTER — Encounter: Payer: Self-pay | Admitting: Nurse Practitioner

## 2018-03-12 VITALS — BP 120/60 | HR 59 | Ht 65.0 in | Wt 143.4 lb

## 2018-03-12 DIAGNOSIS — I10 Essential (primary) hypertension: Secondary | ICD-10-CM

## 2018-03-12 DIAGNOSIS — Z952 Presence of prosthetic heart valve: Secondary | ICD-10-CM

## 2018-03-12 DIAGNOSIS — I25709 Atherosclerosis of coronary artery bypass graft(s), unspecified, with unspecified angina pectoris: Secondary | ICD-10-CM

## 2018-03-12 NOTE — Progress Notes (Signed)
CARDIOLOGY OFFICE NOTE  Date:  03/12/2018    Hunter Choi Date of Birth: 10/24/1927 Medical Record #[id number]  PCP:  [name], MD  Cardiologist:  Katrinka Blazing    Chief Complaint  Patient presents with  . Coronary Artery Disease    Follow up visit - seen for Dr. Katrinka Blazing    History of Present Illness: Hunter Choi is a 84 y.o. male who presents today for a 6 month check. Seen for Dr. Katrinka Blazing.   He has a history of TAVR for aortic stenosis in 2014, CAD with prior CABG in 1999 with bypass graft occlusion, hearing loss, PAF - not on anticoagulation, systolic heart failure with most recent LVEF less than 30% and chronic kidney disease.   Last seen here in June by Dr. Katrinka Blazing - had been hospitalized the month prior with sepsis. Had elevated troponins - due to demand ischemia. No angina.   Hospitalized in September with low sodium level and CHF.   Comes in today. Here with his son - Hunter Choi. Doing pretty good. Can't walk very far but does walk some in his house. Uses a wheelchair. Still getting some PT. Son monitors his weight. No swelling. Breathing seems stable. Remains pretty hard of hearing. Seeing a foot doctor - has had some clear drainage from a toe. Overall, seems to be holding his own, Hunter Choi actually thinks he is doing better than last visit here.   Past Medical History:  Diagnosis Date  . AAA (abdominal aortic aneurysm) (HCC)    Dr. Ashley Royalty  . Aortic stenosis, severe    a. s/p TAVR  . Blind left eye   . BPH (benign prostatic hypertrophy)    Dr. Retta Diones  . CAD (coronary artery disease) of bypass graft    Chronic occlusion of saphenous vein graft to RCA  . Coronary artery disease    Left main disease and severe 3-vessel CAD  . Diastolic heart failure (HCC)   . Embolism involving retinal artery   . GERD (gastroesophageal reflux disease)   . Hard of hearing   . Hypertension   . Hypothyroidism   . Iliac artery aneurysm (HCC)    Dr. Edilia Bo  . PAF  (paroxysmal atrial fibrillation) (HCC)   . 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 12/2012: mm Stephannie Peters XT transcatheter heart valve placed via transapical approach  . Stroke [facility])    mini stroke effective his eye left    Past Surgical History:  Procedure Laterality Date  . BIOPSY PROSTATE  2005  . BUNIONECTOMY WITH HAMMERTOE RECONSTRUCTION Bilateral   . CARDIAC CATHETERIZATION    . CARDIAC CATHETERIZATION N/A 05/07/2015   Procedure: Right Heart Cath and Coronary/Graft Angiography;  Surgeon: Lyn Records, MD;  Location: [facility] INVASIVE CV LAB;  Service: Cardiovascular;  Laterality: N/A;  . CAROTID ENDARTERECTOMY Right Jan. 2, 1997  . CATARACT EXTRACTION W/ INTRAOCULAR LENS IMPLANT Right   . CHOLECYSTECTOMY N/A 01/28/2015   Procedure: LAPAROSCOPIC CHOLECYSTECTOMY WITH INTRAOPERATIVE CHOLANGIOGRAM;  Surgeon: Gaynelle Adu, MD;  Location: [facility] OR;  Service: General;  Laterality: N/A;  . COLONOSCOPY    . CORONARY ARTERY BYPASS GRAFT  01/1998   Dr. Sheliah Plane; LIMA to LAD, SVG to D1, SVG to LCx, SVG to RCA, open saphenous vein harvest via right lower extremity  . HARVEST BONE GRAFT  1982   FOR THE  ANKLE  . HEMIARTHROPLASTY HIP Left    AFTER HIP FX  . INTRAOPERATIVE TRANSESOPHAGEAL ECHOCARDIOGRAM N/A 01/15/2013  Procedure: INTRAOPERATIVE TRANSESOPHAGEAL ECHOCARDIOGRAM;  Surgeon: [name], MD;  Location: [facility] OR;  Service: Open Heart Surgery;  Laterality: N/A;  . JOINT REPLACEMENT    . LEFT AND RIGHT HEART CATHETERIZATION WITH CORONARY ANGIOGRAM N/A 12/20/2012   Procedure: LEFT AND RIGHT HEART CATHETERIZATION WITH CORONARY ANGIOGRAM;  Surgeon: Lesleigh Noe, MD;  Location[facility] C[facility] LAB;  Service: Cardiovascular;  Laterality: N/A;  . LEFT HEART CATH AND CORS/GRAFTS ANGIOGRAPHY N/A 02/14/2017   Procedure: LEFT HEART CATH AND CORS/GRAFTS ANGIOGRAPHY;  Surgeon: Marykay Lex, MD;  Location: [facility] INVASIVE CV LAB;  Service: Cardiovascular;  Laterality: N/A;  .  SKIN GRAFTS  1968  . TOTAL HIP ARTHROPLASTY Left 2007  . TOTAL KNEE ARTHROPLASTY  1992-2002   right-left  . TRANSCATHETER AORTIC VALVE REPLACEMENT, TRANSAPICAL N/A 01/15/2013   Procedure: TRANSCATHETER AORTIC VALVE REPLACEMENT, TRANSAPICAL;  Surgeon: [name], MD;  Location: [facility] OR;  Service: Open Heart Surgery;  Laterality: N/A;  . TRANSURETHRAL RESECTION OF PROSTATE  2012     Medications: Current Meds  Medication Sig  . acetaminophen (TYLENOL) 325 MG tablet Take 2 tablets (650 mg total) by mouth every 6 (six) hours as needed for mild pain, moderate pain, fever or headache. (Patient taking differently: Take 1,000 mg by mouth 2 (two) times daily. )  . amLODipine (NORVASC) 5 MG tablet Take 5 mg by mouth daily.  Marland Kitchen aspirin EC 81 MG EC tablet Take 1 tablet (81 mg total) by mouth daily.  Marland Kitchen atorvastatin (LIPITOR) 20 MG tablet Take 20 mg by mouth daily.  . carvedilol (COREG) 6.25 MG tablet Take 1 tablet (6.25 mg total) by mouth 2 (two) times daily with a meal.  . clopidogrel (PLAVIX) 75 MG tablet Take 1 tablet (75 mg total) by mouth daily.  . fluticasone (FLONASE) 50 MCG/ACT nasal spray Place 1 spray into both nostrils as needed.   . folic acid (FOLVITE) 1 MG tablet Take 3 mg by mouth daily.  . furosemide (LASIX) 20 MG tablet TAKE 2 TABLETS BY MOUTH ONE TIME A DAY  . lactose free nutrition (BOOST) LIQD Take 237 mLs daily by mouth.  . levothyroxine (SYNTHROID, LEVOTHROID) 50 MCG tablet Take 50 mcg by mouth daily.  Marland Kitchen loratadine (CLARITIN) 10 MG tablet Take 10 mg by mouth daily.  . methotrexate 2.5 MG tablet Take 25 mg by mouth every Friday.   . nitroGLYCERIN (NITROSTAT) 0.4 MG SL tablet PLACE 1 TABLET (0.4 MG TOTAL) UNDER THE TONGUE EVERY 5 MINUTES AS NEEDED FOR CHEST PAIN (Patient taking differently: Place 0.4 mg under the tongue every 5 (five) minutes as needed. )  . pantoprazole (PROTONIX) 40 MG tablet Take 1 tablet (40 mg total) by mouth daily.  . predniSONE (DELTASONE) 5 MG tablet Take 5  mg by mouth daily.     Allergies: Allergies  Allergen Reactions  . Ciprofloxacin Other (See Comments)    REACTION: mild delirium    Social History: The patient  reports that he quit smoking about 54 years ago. His smoking use included cigarettes. He has a 18.50 pack-year smoking history. He has quit using smokeless tobacco.  His smokeless tobacco use included chew. He reports that he does not drink alcohol or use drugs.   Family History: The patient's family history includes Heart attack in his brother, father, and mother; Heart disease in his brother, father, and mother; Hyperlipidemia in his brother, father, and mother; Hypertension in his brother, father, mother, and son.   Review of Systems: Please see the  history of present illness.   Otherwise, the review of systems is positive for none.   All other systems are reviewed and negative.   Physical Exam: VS:  BP 120/60 (BP Location: Left Arm, Patient Position: Sitting, Cuff Size: Normal)   Pulse (!) 59   Ht 5\' 5"  (1.651 m)   Wt 143 lb 6.4 oz (65 kg)   SpO2 96% Comment: at rest  BMI 23.86 kg/m  .  BMI Body mass index is 23.86 kg/m.  Wt Readings from Last 3 Encounters:  03/12/18 143 lb 6.4 oz (65 kg)  12/14/17 146 lb 2.6 oz (66.3 kg)  09/18/17 146 lb (66.2 kg)    General: Pleasant. Elderly. Alert and in no acute distress.  He is quite hard of hearing.  HEENT: Normal.  Neck: Supple, no JVD, carotid bruits, or masses noted.  Cardiac: Regular rate and rhythm. No murmurs, rubs, or gallops. No edema.  Respiratory:  Lungs are fairly clear to auscultation bilaterally with normal work of breathing.  GI: Soft and nontender.  MS: No deformity or atrophy. Gait not tested.  Skin: Warm and dry. Color is normal.  Neuro:  Strength and sensation are intact and no gross focal deficits noted.  Psych: Alert, appropriate and with normal affect.   LABORATORY DATA:  EKG:  EKG is not ordered today.  Lab Results  Component Value Date    WBC 7.9 12/14/2017   HGB 9.6 (L) 12/14/2017   HCT 30.7 (L) 12/14/2017   PLT 193 12/14/2017   GLUCOSE 115 (H) 12/15/2017   CHOL 84 02/21/2014   TRIG 93 02/21/2014   HDL 27 (L) 02/21/2014   LDLCALC 38 02/21/2014   ALT 27 08/08/2017   AST 36 08/08/2017   NA 132 (L) 12/15/2017   K 4.3 12/15/2017   CL 93 (L) 12/15/2017   CREATININE 1.51 (H) 12/15/2017   BUN 39 (H) 12/15/2017   CO2 25 12/15/2017   TSH 1.603 08/04/2017   INR 1.34 08/03/2017   HGBA1C 6.0 (H) 05/01/2015       BNP (last 3 results) Recent Labs    08/03/17 2010 12/09/17 1300  BNP 2,896.1* 2,836.3*    ProBNP (last 3 results) No results for input(s): PROBNP in the last 8760 hours.   Other Studies Reviewed Today:  Echo Study Conclusions 07/2017  - Left ventricle: The cavity size was normal. Wall thickness was   increased in a pattern of moderate LVH. Systolic function was   moderately to severely reduced. The estimated ejection fraction   was in the range of 30% to 35%. Diffuse hypokinesis with regional   variation. The study is not technically sufficient to allow   evaluation of LV diastolic function. - Aortic valve: s/p TAVR. No obstruction. Trivial perivalvular   leak. Mean gradient (S): 6 mm Hg. Peak gradient (S): 13 mm Hg.   Valve area (VTI): 1.7 cm^2. Valve area (Vmax): 1.56 cm^2. Valve   area (Vmean): 1.54 cm^2. - Mitral valve: Mildly thickened leaflets . There was moderate   regurgitation. - Left atrium: Severely dilated. - Right ventricle: The cavity size was mildly dilated. Mildly   reduced systolic function. - Right atrium: The atrium was mildly dilated.  Impressions:  - Compared to a prior study in 2018, the LVEF is lower at 30-35%.   LEFT HEART CATH AND CORS/GRAFTS ANGIOGRAPHY 01/2017  Conclusion     Mid LM to Ost LAD lesion is 65% stenosed.  Ost Cx to Prox Cx lesion is 90% stenosed. Mid Cx  lesion is 100% stenosed.  Ost LAD to Prox LAD lesion is 99% stenosed. Prox LAD to Mid  LAD lesion is 100% stenosed.  LIMA-LAD graft is widely patent  Ost 1st Diag lesion is 99% stenosed.  SVG-Diag1/Ramus -very large, patulous/ectatic vessel. The grafted vessel is smooth without disease.  SVG-OM - prox Graft lesion is 70% focal, thrombotic lesion in a valve segment.  Prox RCA lesion is 75% stenosed. Dist RCA lesion is 100% stenosed with 100% stenosed side branch in Post Atrio.  SVG-distal RCA origin lesion is 100% stenosed.  There is severe left ventricular systolic dysfunction.  LV end diastolic pressure is moderately elevated.  The left ventricular ejection fraction is 25-35% by visual estimate.   The patient has severe native vessel disease with essentially no native Left Coronary Artery vessels remaining patent.  The native RCA has extensive disease in the proximal segment and then appears to be occluded at the bifurcation of the posterolateral branch and PDA.  The graft to the RPDA is known to be occluded. There are 2 remaining vein grafts are patent, however the vein graft to OM1 focal thrombotic lesion and what appears to be a graft.  The remainder of this graft as well as the SVG-Diag/RI are very patulous/ectatic vessels almost aneurysmal.  Intervention on a 84 year old vein graft with this much disease is likely fraught with high risk for complications.  Given his extreme cardiomyopathy, I fear that attempted PCI of this vessel with potential occlusion of the graft and the downstream vessel would lead to life-threatening hemodynamic instability.  I reviewed the images with Dr. Excell Seltzer, he and I both believe that the graft is too far disease to risk attempted PCI.  Therefore the plan will be to return the patient to the nursing unit, run heparin for this 48 hours and continue current medical management with addition of Plavix.  Plan: The patient will move to the holding area for sheath to be removed with manual pressure. Restart IV heparin 8 hours after sheath  removal. I have little with 300 mg Plavix today and then starts in her milligrams Plavix tomorrow. Based on patient's symptoms, would need to titrate antianginal as well as heart failure medications.  Unfortunately, I was not able to get much of a story from the patient today and would prefer for this to be managed by the primary team.   Bryan Lemma, MD    Assessment/Plan:  1. CAD - with prior CABG and known graft failure - managed medically - no active symptoms. Would favor conservative management.   2. Prior TAVR - last echo noted.   3. Chronic combined systolic and diastolic HF - seems compensated today - weight is stable. Weighing daily at home. Labs from October noted.   4. Severe HOH  5. CKD - no longer on ARBdue to worsening kidney function.   6. Paroxysmal A. Fib - he appears to be in sinus on exam. He is not on anticoagulation - I suspect due to overall frailty.   7. Chronic methotrexate, prednisone and folate use:  For underlying rheumatoid arthritis.  Current medicines are reviewed with the patient today.  The patient does not have concerns regarding medicines other than what has been noted above.  The following changes have been made:  See above.  Labs/ tests ordered today include:   No orders of the defined types were placed in this encounter.    Disposition:   FU with Dr.Smith in 6 months.  I'm happy to see  back as needed.    Patient is agreeable to this plan and will call if any problems develop in the interim.   SignedNorma Fredrickson, NP  03/12/2018 2:15 PM  [place] [facility] [address] [place], [place]  [address] Phone: [phone] Fax: [phone]

## 2018-03-12 NOTE — Patient Instructions (Signed)
We will be checking the following labs today - NONE   Medication Instructions:    Continue with your current medicines.    If you need a refill on your cardiac medications before your next appointment, please call your pharmacy.     Testing/Procedures To Be Arranged:  N/A  Follow-Up:   See Dr.Smith in 6 months    At [facility], you and your health needs are our priority.  As part of our continuing mission to provide you with exceptional heart care, we have created designated Provider Care Teams.  These Care Teams include your primary Cardiologist (physician) and Advanced Practice Providers (APPs -  Physician Assistants and Nurse Practitioners) who all work together to provide you with the care you need, when you need it.  Special Instructions:  . None  Call the [facility] office at [phone] if you have any questions, problems or concerns.       

## 2018-03-22 ENCOUNTER — Encounter (HOSPITAL_COMMUNITY): Payer: Self-pay | Admitting: Emergency Medicine

## 2018-03-22 ENCOUNTER — Inpatient Hospital Stay (HOSPITAL_COMMUNITY)
Admission: EM | Admit: 2018-03-22 | Discharge: 2018-03-27 | DRG: 070 | Disposition: A | Discharge status: Skilled Nursing Facility | Payer: Medicare Other | Attending: Internal Medicine | Admitting: Internal Medicine

## 2018-03-22 ENCOUNTER — Other Ambulatory Visit: Payer: Self-pay

## 2018-03-22 DIAGNOSIS — N183 Chronic kidney disease, stage 3 unspecified: Secondary | ICD-10-CM | POA: Diagnosis present

## 2018-03-22 DIAGNOSIS — E43 Unspecified severe protein-calorie malnutrition: Secondary | ICD-10-CM | POA: Diagnosis present

## 2018-03-22 DIAGNOSIS — I429 Cardiomyopathy, unspecified: Secondary | ICD-10-CM | POA: Diagnosis not present

## 2018-03-22 DIAGNOSIS — Z8673 Personal history of transient ischemic attack (TIA), and cerebral infarction without residual deficits: Secondary | ICD-10-CM

## 2018-03-22 DIAGNOSIS — N400 Enlarged prostate without lower urinary tract symptoms: Secondary | ICD-10-CM | POA: Diagnosis not present

## 2018-03-22 DIAGNOSIS — Z7902 Long term (current) use of antithrombotics/antiplatelets: Secondary | ICD-10-CM | POA: Diagnosis not present

## 2018-03-22 DIAGNOSIS — I10 Essential (primary) hypertension: Secondary | ICD-10-CM | POA: Diagnosis present

## 2018-03-22 DIAGNOSIS — Z96653 Presence of artificial knee joint, bilateral: Secondary | ICD-10-CM | POA: Diagnosis present

## 2018-03-22 DIAGNOSIS — E039 Hypothyroidism, unspecified: Secondary | ICD-10-CM | POA: Diagnosis not present

## 2018-03-22 DIAGNOSIS — I130 Hypertensive heart and chronic kidney disease with heart failure and stage 1 through stage 4 chronic kidney disease, or unspecified chronic kidney disease: Secondary | ICD-10-CM | POA: Diagnosis present

## 2018-03-22 DIAGNOSIS — Z79899 Other long term (current) drug therapy: Secondary | ICD-10-CM

## 2018-03-22 DIAGNOSIS — I25709 Atherosclerosis of coronary artery bypass graft(s), unspecified, with unspecified angina pectoris: Secondary | ICD-10-CM | POA: Diagnosis not present

## 2018-03-22 DIAGNOSIS — I2510 Atherosclerotic heart disease of native coronary artery without angina pectoris: Secondary | ICD-10-CM | POA: Diagnosis present

## 2018-03-22 DIAGNOSIS — I5042 Chronic combined systolic (congestive) and diastolic (congestive) heart failure: Secondary | ICD-10-CM | POA: Diagnosis not present

## 2018-03-22 DIAGNOSIS — H5462 Unqualified visual loss, left eye, normal vision right eye: Secondary | ICD-10-CM | POA: Diagnosis present

## 2018-03-22 DIAGNOSIS — R7989 Other specified abnormal findings of blood chemistry: Secondary | ICD-10-CM

## 2018-03-22 DIAGNOSIS — Z7982 Long term (current) use of aspirin: Secondary | ICD-10-CM

## 2018-03-22 DIAGNOSIS — Z9841 Cataract extraction status, right eye: Secondary | ICD-10-CM

## 2018-03-22 DIAGNOSIS — Z951 Presence of aortocoronary bypass graft: Secondary | ICD-10-CM

## 2018-03-22 DIAGNOSIS — M069 Rheumatoid arthritis, unspecified: Secondary | ICD-10-CM | POA: Diagnosis present

## 2018-03-22 DIAGNOSIS — Z96642 Presence of left artificial hip joint: Secondary | ICD-10-CM | POA: Diagnosis present

## 2018-03-22 DIAGNOSIS — E538 Deficiency of other specified B group vitamins: Secondary | ICD-10-CM | POA: Diagnosis not present

## 2018-03-22 DIAGNOSIS — Z952 Presence of prosthetic heart valve: Secondary | ICD-10-CM

## 2018-03-22 DIAGNOSIS — Z79890 Hormone replacement therapy (postmenopausal): Secondary | ICD-10-CM

## 2018-03-22 DIAGNOSIS — G9341 Metabolic encephalopathy: Principal | ICD-10-CM | POA: Diagnosis present

## 2018-03-22 DIAGNOSIS — Z87891 Personal history of nicotine dependence: Secondary | ICD-10-CM

## 2018-03-22 DIAGNOSIS — K219 Gastro-esophageal reflux disease without esophagitis: Secondary | ICD-10-CM | POA: Diagnosis present

## 2018-03-22 DIAGNOSIS — Z953 Presence of xenogenic heart valve: Secondary | ICD-10-CM

## 2018-03-22 DIAGNOSIS — R410 Disorientation, unspecified: Secondary | ICD-10-CM

## 2018-03-22 DIAGNOSIS — I714 Abdominal aortic aneurysm, without rupture: Secondary | ICD-10-CM | POA: Diagnosis not present

## 2018-03-22 DIAGNOSIS — I214 Non-ST elevation (NSTEMI) myocardial infarction: Secondary | ICD-10-CM | POA: Diagnosis present

## 2018-03-22 DIAGNOSIS — E079 Disorder of thyroid, unspecified: Secondary | ICD-10-CM | POA: Diagnosis present

## 2018-03-22 DIAGNOSIS — Z961 Presence of intraocular lens: Secondary | ICD-10-CM | POA: Diagnosis present

## 2018-03-22 DIAGNOSIS — Z888 Allergy status to other drugs, medicaments and biological substances status: Secondary | ICD-10-CM

## 2018-03-22 DIAGNOSIS — Z6822 Body mass index (BMI) 22.0-22.9, adult: Secondary | ICD-10-CM

## 2018-03-22 DIAGNOSIS — H9190 Unspecified hearing loss, unspecified ear: Secondary | ICD-10-CM

## 2018-03-22 DIAGNOSIS — I25810 Atherosclerosis of coronary artery bypass graft(s) without angina pectoris: Secondary | ICD-10-CM | POA: Diagnosis present

## 2018-03-22 DIAGNOSIS — Z7952 Long term (current) use of systemic steroids: Secondary | ICD-10-CM

## 2018-03-22 DIAGNOSIS — Z8249 Family history of ischemic heart disease and other diseases of the circulatory system: Secondary | ICD-10-CM

## 2018-03-22 DIAGNOSIS — R778 Other specified abnormalities of plasma proteins: Secondary | ICD-10-CM | POA: Diagnosis present

## 2018-03-22 DIAGNOSIS — R531 Weakness: Secondary | ICD-10-CM

## 2018-03-22 LAB — TROPONIN I
TROPONIN I: 0.04 ng/mL — AB (ref <0.03)
Troponin I: 0.04 ng/mL (ref <0.03)
Troponin I: 0.03 ng/mL (ref <0.03)
Troponin I: 0.03 ng/mL (ref <0.03)

## 2018-03-22 LAB — URINALYSIS, ROUTINE W REFLEX MICROSCOPIC
Bacteria, UA: NONE SEEN
Bilirubin Urine: NEGATIVE
Glucose, UA: NEGATIVE mg/dL
Ketones, ur: NEGATIVE mg/dL
Leukocytes, UA: NEGATIVE
Nitrite: NEGATIVE
Protein, ur: NEGATIVE mg/dL
Specific Gravity, Urine: 1.011 (ref 1.005–1.030)
pH: 6 (ref 5.0–8.0)

## 2018-03-22 LAB — COMPREHENSIVE METABOLIC PANEL
ALK PHOS: 69 U/L (ref 38–126)
ALT: 10 U/L (ref 0–44)
AST: 19 U/L (ref 15–41)
Albumin: 3.7 g/dL (ref 3.5–5.0)
Anion gap: 11 (ref 5–15)
BUN: 32 mg/dL — AB (ref 8–23)
CO2: 25 mmol/L (ref 22–32)
Calcium: 8.7 mg/dL — ABNORMAL LOW (ref 8.9–10.3)
Chloride: 99 mmol/L (ref 98–111)
Creatinine, Ser: 1.59 mg/dL — ABNORMAL HIGH (ref 0.61–1.24)
GFR calc Af Amer: 44 mL/min — ABNORMAL LOW (ref >60)
GFR calc non Af Amer: 38 mL/min — ABNORMAL LOW (ref >60)
Glucose, Bld: 117 mg/dL — ABNORMAL HIGH (ref 70–99)
Potassium: 5 mmol/L (ref 3.5–5.1)
SODIUM: 135 mmol/L (ref 135–145)
Total Bilirubin: 0.8 mg/dL (ref 0.3–1.2)
Total Protein: 7 g/dL (ref 6.5–8.1)

## 2018-03-22 LAB — CBG MONITORING, ED: Glucose-Capillary: 113 mg/dL — ABNORMAL HIGH (ref 70–99)

## 2018-03-22 LAB — CBC
HCT: 35.3 % — ABNORMAL LOW (ref 39.0–52.0)
Hemoglobin: 10.5 g/dL — ABNORMAL LOW (ref 13.0–17.0)
MCH: 26.9 pg (ref 26.0–34.0)
MCHC: 29.7 g/dL — AB (ref 30.0–36.0)
MCV: 90.3 fL (ref 80.0–100.0)
Platelets: 164 10*3/uL (ref 150–400)
RBC: 3.91 MIL/uL — ABNORMAL LOW (ref 4.22–5.81)
RDW: 19.2 % — ABNORMAL HIGH (ref 11.5–15.5)
WBC: 9.8 10*3/uL (ref 4.0–10.5)
nRBC: 0 % (ref 0.0–0.2)

## 2018-03-22 LAB — BRAIN NATRIURETIC PEPTIDE: B Natriuretic Peptide: 1508.9 pg/mL — ABNORMAL HIGH (ref 0.0–100.0)

## 2018-03-22 MED ORDER — ONDANSETRON HCL 4 MG/2ML IJ SOLN: 4.0000 mg | Freq: Four times a day (QID) | INTRAMUSCULAR | Status: DC | PRN

## 2018-03-22 MED ORDER — ASPIRIN EC 81 MG PO TBEC
81.0000 mg | DELAYED_RELEASE_TABLET | Freq: Every day | ORAL | Status: DC
  Administered 2018-03-22 – 2018-03-27 (×5): 81 mg via ORAL
  Filled 2018-03-22 (×5): qty 1

## 2018-03-22 MED ORDER — CARVEDILOL 6.25 MG PO TABS
6.2500 mg | ORAL_TABLET | Freq: Two times a day (BID) | ORAL | Status: DC
  Administered 2018-03-22 – 2018-03-27 (×9): 6.25 mg via ORAL
  Filled 2018-03-22 (×8): qty 1

## 2018-03-22 MED ORDER — DOCUSATE SODIUM 100 MG PO CAPS
100.0000 mg | ORAL_CAPSULE | Freq: Two times a day (BID) | ORAL | Status: DC
  Administered 2018-03-22 – 2018-03-26 (×8): 100 mg via ORAL
  Filled 2018-03-22 (×10): qty 1

## 2018-03-22 MED ORDER — ENOXAPARIN SODIUM 40 MG/0.4ML ~~LOC~~ SOLN
40.0000 mg | SUBCUTANEOUS | Status: DC
  Administered 2018-03-22: 40 mg via SUBCUTANEOUS
  Filled 2018-03-22: qty 0.4

## 2018-03-22 MED ORDER — ACETAMINOPHEN 650 MG RE SUPP: 650.0000 mg | Freq: Four times a day (QID) | RECTAL | Status: DC | PRN

## 2018-03-22 MED ORDER — ACETAMINOPHEN 325 MG PO TABS: 650.0000 mg | ORAL_TABLET | Freq: Four times a day (QID) | ORAL | Status: DC | PRN

## 2018-03-22 MED ORDER — AMLODIPINE BESYLATE 2.5 MG PO TABS
5.0000 mg | ORAL_TABLET | Freq: Every day | ORAL | Status: DC
  Administered 2018-03-22 – 2018-03-27 (×5): 5 mg via ORAL
  Filled 2018-03-22 (×5): qty 2

## 2018-03-22 MED ORDER — ATORVASTATIN CALCIUM 10 MG PO TABS
20.0000 mg | ORAL_TABLET | Freq: Every day | ORAL | Status: DC
  Administered 2018-03-22 – 2018-03-27 (×5): 20 mg via ORAL
  Filled 2018-03-22 (×5): qty 2

## 2018-03-22 MED ORDER — BOOST PO LIQD
237.0000 mL | Freq: Every day | ORAL | Status: DC
  Filled 2018-03-22 (×2): qty 237

## 2018-03-22 MED ORDER — FOLIC ACID 1 MG PO TABS
3.0000 mg | ORAL_TABLET | Freq: Every day | ORAL | Status: DC
  Administered 2018-03-22 – 2018-03-27 (×5): 3 mg via ORAL
  Filled 2018-03-22 (×5): qty 3

## 2018-03-22 MED ORDER — ACETAMINOPHEN 500 MG PO TABS
1000.0000 mg | ORAL_TABLET | Freq: Two times a day (BID) | ORAL | Status: DC
  Administered 2018-03-22 – 2018-03-27 (×9): 1000 mg via ORAL
  Filled 2018-03-22 (×10): qty 2

## 2018-03-22 MED ORDER — LORATADINE 10 MG PO TABS
10.0000 mg | ORAL_TABLET | Freq: Every day | ORAL | Status: DC
  Administered 2018-03-22 – 2018-03-27 (×5): 10 mg via ORAL
  Filled 2018-03-22 (×5): qty 1

## 2018-03-22 MED ORDER — ONDANSETRON HCL 4 MG PO TABS: 4.0000 mg | ORAL_TABLET | Freq: Four times a day (QID) | ORAL | Status: DC | PRN

## 2018-03-22 MED ORDER — PREDNISONE 5 MG PO TABS
5.0000 mg | ORAL_TABLET | Freq: Every day | ORAL | Status: DC
  Administered 2018-03-22 – 2018-03-27 (×6): 5 mg via ORAL
  Filled 2018-03-22 (×6): qty 1

## 2018-03-22 MED ORDER — CLOPIDOGREL BISULFATE 75 MG PO TABS
75.0000 mg | ORAL_TABLET | Freq: Every day | ORAL | Status: DC
  Administered 2018-03-22 – 2018-03-27 (×5): 75 mg via ORAL
  Filled 2018-03-22 (×5): qty 1

## 2018-03-22 MED ORDER — LEVOTHYROXINE SODIUM 50 MCG PO TABS
50.0000 ug | ORAL_TABLET | Freq: Every day | ORAL | Status: DC
  Administered 2018-03-22 – 2018-03-26 (×5): 50 ug via ORAL
  Filled 2018-03-22 (×5): qty 1

## 2018-03-22 MED ORDER — FUROSEMIDE 40 MG PO TABS
40.0000 mg | ORAL_TABLET | Freq: Every day | ORAL | Status: DC
  Administered 2018-03-22: 40 mg via ORAL
  Filled 2018-03-22: qty 1

## 2018-03-22 MED ORDER — SODIUM CHLORIDE 0.9% FLUSH
3.0000 mL | Freq: Two times a day (BID) | INTRAVENOUS | Status: DC
  Administered 2018-03-22 – 2018-03-27 (×8): 3 mL via INTRAVENOUS

## 2018-03-22 MED ORDER — PANTOPRAZOLE SODIUM 40 MG PO TBEC
40.0000 mg | DELAYED_RELEASE_TABLET | Freq: Every day | ORAL | Status: DC
  Administered 2018-03-22 – 2018-03-27 (×5): 40 mg via ORAL
  Filled 2018-03-22 (×5): qty 1

## 2018-03-22 NOTE — ED Provider Notes (Signed)
[facility] EMERGENCY DEPARTMENT Provider Note  CSN: [id number] Arrival date & time: 03/22/18 1062  Chief Complaint(s) Altered Mental Status and Weakness  HPI Hunter Choi is a 84 y.o. male    Altered Mental Status   This is a recurrent problem. Episode onset: approx 1 week, but worse in the last few days. The problem has been gradually worsening. Associated symptoms include confusion, weakness (fatigue and difficulty walking), delusions and hallucinations (family reports that he was "talking to four people in his room that were not there."). His past medical history is significant for CVA and heart disease (CHF with last EF 30-35% in May 2019 on diuretics).   H/o hyponatremia and this is typical for low Na.  Family also reports that the patient has been like this from urinary tract infections.  Family denies any falls or trauma.  Past Medical History Past Medical History:  Diagnosis Date  . AAA (abdominal aortic aneurysm) (HCC)    Dr. Ashley Royalty  . Aortic stenosis, severe    a. s/p TAVR  . Blind left eye   . BPH (benign prostatic hypertrophy)    Dr. Retta Diones  . CAD (coronary artery disease) of bypass graft    Chronic occlusion of saphenous vein graft to RCA  . Coronary artery disease    Left main disease and severe 3-vessel CAD  . Diastolic heart failure (HCC)   . Embolism involving retinal artery   . GERD (gastroesophageal reflux disease)   . Hard of hearing   . Hypertension   . Hypothyroidism   . Iliac artery aneurysm (HCC)    Dr. Edilia Bo  . PAF (paroxysmal atrial fibrillation) (HCC)   . 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 12/2012: mm Stephannie Peters XT transcatheter heart valve placed via transapical approach  . Stroke [facility])    mini stroke effective his eye left   Patient Active Problem List   Diagnosis Date Noted  . Acute metabolic encephalopathy 03/22/2018  . CKD (chronic kidney disease) stage 3, GFR 30-59  ml/min (HCC) 12/09/2017  . Acute on chronic systolic CHF (congestive heart failure), NYHA class 4 (HCC) 12/09/2017  . SIRS (systemic inflammatory response syndrome) (HCC) 08/03/2017  . Essential hypertension 08/03/2017  . Elevated troponin   . Sepsis due to urinary tract infection (HCC) 10/02/2015  . Hard of hearing   . Pressure ulcer 01/28/2015  . Acute on chronic combined systolic and diastolic congestive heart failure, NYHA class 4 (HCC) 01/27/2015  . Hyponatremia 11/21/2014  . Elevated LFTs 11/14/2014  . Anemia 05/23/2014  . Thrombocytopenia (HCC) 02/21/2014  . Malnutrition of moderate degree (HCC) 02/21/2014  . Occlusion and stenosis of carotid artery without mention of cerebral infarction 09/04/2013  . Aftercare following surgery of the circulatory system, NEC 09/04/2013  . AAA (abdominal aortic aneurysm) without rupture (HCC) 03/06/2013  . 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 Coronary artery disease involving coronary bypass graft of native heart with angina pectoris (HCC)   . GERD (gastroesophageal reflux disease)   . Thyroid disease   . Embolism involving retinal artery   . Iliac artery aneurysm (HCC)   . Rheumatoid arthritis (HCC) 04/22/2010   Home Medication(s) Prior to Admission medications   Medication Sig Start Date End Date Taking? Authorizing Provider  acetaminophen (TYLENOL) 500 MG tablet Take 1,000 mg by mouth 2 (two) times daily.   Yes [provider]  amLODipine (NORVASC) 5 MG tablet Take 5 mg by mouth daily.  Yes [provider]  aspirin EC 81 MG EC tablet Take 1 tablet (81 mg total) by mouth daily. 01/21/13  Yes Doree FudgeZimmerman, Donielle M, PA-C  atorvastatin (LIPITOR) 20 MG tablet Take 20 mg by mouth daily.   Yes [provider]  carvedilol (COREG) 6.25 MG tablet Take 1 tablet (6.25 mg total) by mouth 2 (two) times daily with a meal. 10/21/15  Yes Lyn RecordsSmith, Henry W, MD  clopidogrel (PLAVIX) 75 MG tablet Take 1 tablet  (75 mg total) by mouth daily. 04/19/17  Yes Leone BrandIngold, Laura R, NP  folic acid (FOLVITE) 1 MG tablet Take 3 mg by mouth daily.   Yes [provider]  furosemide (LASIX) 20 MG tablet TAKE 2 TABLETS BY MOUTH ONE TIME A DAY Patient taking differently: Take 40 mg by mouth daily.  02/19/18  Yes Lyn RecordsSmith, Henry W, MD  lactose free nutrition (BOOST) LIQD Take 237 mLs daily by mouth.   Yes [provider]  levothyroxine (SYNTHROID, LEVOTHROID) 50 MCG tablet Take 50 mcg by mouth daily. 11/06/14  Yes [provider]  loratadine (CLARITIN) 10 MG tablet Take 10 mg by mouth daily. 06/29/16  Yes [provider]  methotrexate 2.5 MG tablet Take 25 mg by mouth every Friday.    Yes [provider]  nitroGLYCERIN (NITROSTAT) 0.4 MG SL tablet PLACE 1 TABLET (0.4 MG TOTAL) UNDER THE TONGUE EVERY 5 MINUTES AS NEEDED FOR CHEST PAIN Patient taking differently: Place 0.4 mg under the tongue every 5 (five) minutes as needed.  12/21/16  Yes Lyn RecordsSmith, Henry W, MD  pantoprazole (PROTONIX) 40 MG tablet Take 1 tablet (40 mg total) by mouth daily. 02/18/17  Yes Azalee CourseMeng, Hao, PA  predniSONE (DELTASONE) 5 MG tablet Take 5 mg by mouth daily. 04/07/15  Yes [provider]  acetaminophen (TYLENOL) 325 MG tablet Take 2 tablets (650 mg total) by mouth every 6 (six) hours as needed for mild pain, moderate pain, fever or headache. Patient not taking: Reported on 03/22/2018 02/03/15   Elease EtienneHongalgi, Anand D, MD                                                                                                                                    Past Surgical History Past Surgical History:  Procedure Laterality Date  . BIOPSY PROSTATE  2005  . BUNIONECTOMY WITH HAMMERTOE RECONSTRUCTION Bilateral   . CARDIAC CATHETERIZATION    . CARDIAC CATHETERIZATION N/A 05/07/2015   Procedure: Right Heart Cath and Coronary/Graft Angiography;  Surgeon: Lyn RecordsHenry W Smith, MD;  [facility];  Service: Cardiovascular;   Laterality: N/A;  . CAROTID ENDARTERECTOMY Right Jan. 2, 1997  . CATARACT EXTRACTION W/ INTRAOCULAR LENS IMPLANT Right   . CHOLECYSTECTOMY N/A 01/28/2015   Procedure: LAPAROSCOPIC CHOLECYSTECTOMY WITH INTRAOPERATIVE CHOLANGIOGRAM;  Surgeon: Gaynelle AduEric Wilson, MD;  [facility];  Service: General;  Laterality: N/A;  . COLONOSCOPY    . CORONARY ARTERY BYPASS GRAFT  01/1998   Dr. Sheliah Plane; LIMA to LAD, SVG to D1, SVG to LCx, SVG to RCA, open saphenous vein harvest via right lower extremity  . HARVEST BONE GRAFT  1982   FOR THE  ANKLE  . HEMIARTHROPLASTY HIP Left    AFTER HIP FX  . INTRAOPERATIVE TRANSESOPHAGEAL ECHOCARDIOGRAM N/A 01/15/2013   Procedure: INTRAOPERATIVE TRANSESOPHAGEAL ECHOCARDIOGRAM;  Surgeon: [name], MD;  [facility];  Service: Open Heart Surgery;  Laterality: N/A;  . JOINT REPLACEMENT    . LEFT AND RIGHT HEART CATHETERIZATION WITH CORONARY ANGIOGRAM N/A 12/20/2012   Procedure: LEFT AND RIGHT HEART CATHETERIZATION WITH CORONARY ANGIOGRAM;  Surgeon: Lesleigh Noe, MD;  Location: [facility] CATH LAB;  Service: Cardiovascular;  Laterality: N/A;  . LEFT HEART CATH AND CORS/GRAFTS ANGIOGRAPHY N/A 02/14/2017   Procedure: LEFT HEART CATH AND CORS/GRAFTS ANGIOGRAPHY;  Surgeon: Marykay Lex, MD;  Location: [facility] INVASIVE CV LAB;  Service: Cardiovascular;  Laterality: N/A;  . SKIN GRAFTS  1968  . TOTAL HIP ARTHROPLASTY Left 2007  . TOTAL KNEE ARTHROPLASTY  1992-2002   right-left  . TRANSCATHETER AORTIC VALVE REPLACEMENT, TRANSAPICAL N/A 01/15/2013   Procedure: TRANSCATHETER AORTIC VALVE REPLACEMENT, TRANSAPICAL;  Surgeon: [name], MD;  Location: [facility] OR;  Service: Open Heart Surgery;  Laterality: N/A;  . TRANSURETHRAL RESECTION OF PROSTATE  2012   Family History Family History  Problem Relation Age of Onset  . Heart attack Mother   . Heart disease Mother        Before age 66  . Hyperlipidemia Mother   . Hypertension Mother   . Hypertension Son   . Heart disease  Father        AAA-   . Hyperlipidemia Father   . Heart attack Father   . Hypertension Father   . Heart disease Brother        Before age 43  . Hyperlipidemia Brother   . Heart attack Brother   . Hypertension Brother     Social History Social History   Tobacco Use  . Smoking status: Former Smoker    Packs/day: 0.50    Years: 37.00    Pack years: 18.50    Types: Cigarettes    Last attempt to quit: 03/29/1963    Years since quitting: 55.0  . Smokeless tobacco: Former Neurosurgeon    Types: Chew  Substance Use Topics  . Alcohol use: No  . Drug use: No   Allergies Ciprofloxacin  Review of Systems Review of Systems  Neurological: Positive for weakness (fatigue and difficulty walking).  Psychiatric/Behavioral: Positive for confusion and hallucinations (family reports that he was "talking to four people in his room that were not there.").   All other systems are reviewed and are negative for acute change except as noted in the HPI  Physical Exam Vital Signs  I have reviewed the triage vital signs BP (!) 141/62   Pulse 66   Temp 98.6 F (37 C) (Oral)   Resp 14   Ht 5\' 5"  (1.651 m)   Wt 67.6 kg   SpO2 99%   BMI 24.79 kg/m   Physical Exam Vitals signs reviewed.  Constitutional:      General: He is not in acute distress.    Appearance: He is well-developed. He is not diaphoretic.  HENT:     Head: Normocephalic and atraumatic.     Nose: Nose normal.  Eyes:     General: No scleral icterus.       Right  eye: No discharge.        Left eye: No discharge.     Conjunctiva/sclera: Conjunctivae normal.     Pupils: Pupils are equal, round, and reactive to light.  Neck:     Musculoskeletal: Normal range of motion and neck supple.  Cardiovascular:     Rate and Rhythm: Normal rate and regular rhythm.     Heart sounds: No murmur. No friction rub. No gallop.   Pulmonary:     Effort: Pulmonary effort is normal. No respiratory distress.     Breath sounds: Normal breath sounds. No  stridor. No rales.  Abdominal:     General: There is no distension.     Palpations: Abdomen is soft.     Tenderness: There is no abdominal tenderness.  Musculoskeletal:        General: No tenderness.     Comments: Bilateral MCP changes consistent with arthritis   Skin:    General: Skin is warm and dry.     Findings: No erythema or rash.  Neurological:     Mental Status: He is alert and oriented to person, place, and time.     Comments: Mental Status:  Alert and oriented to person, place, and time.  Attention and concentration normal.  Speech clear.   Cranial Nerves:  II Visual Fields: left sided blindness III, IV, VI: Pupils equal and reactive to light and near. Full eye movement without nystagmus  V Facial Sensation: Normal. No weakness of masticatory muscles  VII: No facial weakness or asymmetry  VIII Auditory Acuity: Grossly normal  IX/X: The uvula is midline; the palate elevates symmetrically  XI: Normal sternocleidomastoid and trapezius strength  XII: The tongue is midline. No atrophy or fasciculations.   Motor System: Muscle Strength: 5/5 and symmetric in the upper and lower extremities. N  Muscle Tone: Tone and muscle bulk are normal in the upper and lower extremities.   Reflexes: DTRs: 1+ and symmetrical in all four extremities. No Clonus Coordination: No tremor. No asterixis Sensation: Intact to light touch, and pinprick.  Gait: unable to assess due to inability to stand.      ED Results and Treatments Labs (all labs ordered are listed, but only abnormal results are displayed) Labs Reviewed  COMPREHENSIVE METABOLIC PANEL - Abnormal; Notable for the following components:      Result Value   Glucose, Bld 117 (*)    BUN 32 (*)    Creatinine, Ser 1.59 (*)    Calcium 8.7 (*)    GFR calc non Af Amer 38 (*)    GFR calc Af Amer 44 (*)    All other components within normal limits  CBC - Abnormal; Notable for the following components:   RBC 3.91 (*)    Hemoglobin  10.5 (*)    HCT 35.3 (*)    MCHC 29.7 (*)    RDW 19.2 (*)    All other components within normal limits  URINALYSIS, ROUTINE W REFLEX MICROSCOPIC - Abnormal; Notable for the following components:   Color, Urine STRAW (*)    Hgb urine dipstick SMALL (*)    All other components within normal limits  TROPONIN I - Abnormal; Notable for the following components:   Troponin I 0.04 (*)    All other components within normal limits  BRAIN NATRIURETIC PEPTIDE - Abnormal; Notable for the following components:   B Natriuretic Peptide 1,508.9 (*)    All other components within normal limits  CBG MONITORING, ED - Abnormal; Notable for  the following components:   Glucose-Capillary 113 (*)    All other components within normal limits                                                                                                                         EKG  EKG Interpretation  Date/Time:  Thursday March 22 2018 03:43:15 EST Ventricular Rate:  70 PR Interval:    QRS Duration: 181 QT Interval:  453 QTC Calculation: 489 R Axis:   -61 Text Interpretation:  Sinus rhythm Prolonged PR interval Left bundle branch block No significant change was found Reconfirmed by Drema Pry [phone]) on 03/22/2018 4:15:42 AM      Radiology No results found. Pertinent labs & imaging results that were available during my care of the patient were reviewed by me and considered in my medical decision making (see chart for details).  Medications Ordered in ED Medications - No data to display                                                                                                                                  Procedures Procedures  (including critical care time)  Medical Decision Making / ED Course I have reviewed the nursing notes for this encounter and the patient's prior records (if available in EHR or on provided paperwork).    Patient is afebrile with stable vital signs.  Work-up without  hyponatremia or other significant electrolyte derangements that may be contributing to his altered mental status.  UA without evidence of infection.  Patient does not appear to be volume overloaded, is in no respiratory distress, and has stable cardiac markers -concerning for CHF exacerbation. There is a concern for possible medication related confusion from methotrexate given the large dose.  The patient does not have any evidence of hepatotoxicity.   Patient was admitted to medicine for continued work-up and management.    Final Clinical Impression(s) / ED Diagnoses Final diagnoses:  Confusion  Generalized weakness      This chart was dictated using voice recognition software.  Despite best efforts to proofread,  errors can occur which can change the documentation meaning.   Nira Conn, MD 03/22/18 [phone]

## 2018-03-22 NOTE — Care Management (Signed)
This is a no charge note  Pending admission per Dr. Eudelia Bunch  84 year old male with past medical history of hypertension, stroke, GERD, hypothyroidism, AAA, BPH, CAD, CHF with EF of 30%, PAF, s/p of TAVR, RA, who presents with generalized weakness, altered mental status.  Etiology is not clear.  Urinalysis negative.  Troponin 0.04 which is chronic issue.  BNP 1508, which is not hight to pt.  Patient is taking high-dose methotrexate 25 mg weekly for rheumatoid arthritis. ED physician suspects patient may have methotrexate toxicity. Pt is placed on tele bed for obs.   Hunter Harp, MD  [facility] Pager [phone]  If 7PM-7AM, please contact night-coverage [email or web address] Password [facility] 03/22/2018, 6:59 AM

## 2018-03-22 NOTE — Evaluation (Signed)
Physical Therapy Evaluation Patient Details Name: Hunter Choi MRN: [id number] DOB: 1928-01-24 Today's Date: 03/22/2018   History of Present Illness  Pt is an 84 y.o male admitted from home on 03/22/18 with AMS; etiology unclear, UTI negative. Suspect potential methotrexate toxicity as pt taking high-dose for rehumatoid arthritis. Also suspicion for dementia with Lewy bodies. Of note, recent hospitalization 11/2017 for CHF with d/c to SNF. PMH includes HTN, CAD, CVA, CHF, PAF, s/p TAVR, RA.    Clinical Impression  Pt presents with an overall decrease in functional mobility secondary to above. Pt pleasantly confused, disoriented and poor historian; reports he lives with two sons and does not require assist. Today, pt required mod-maxA to transfer with BUE support; pt with bilateral knee instability and posterior lean, unable to take steps without losing balance. Pt would benefit from continued acute PT services to maximize functional mobility and independence prior to d/c with SNF-level therapies.     Follow Up Recommendations SNF;Supervision/Assistance - 24 hour    Equipment Recommendations  Rolling walker with 5" wheels    Recommendations for Other Services       Precautions / Restrictions Precautions Precautions: Fall Restrictions Weight Bearing Restrictions: No      Mobility  Bed Mobility Overal bed mobility: Needs Assistance Bed Mobility: Supine to Sit;Sit to Supine     Supine to sit: Supervision Sit to supine: Supervision   General bed mobility comments: Moves well with bed mobility  Transfers Overall transfer level: Needs assistance Equipment used: Rolling walker (2 wheeled);2 person hand held assist Transfers: Sit to/from [employer] Sit to Stand: Mod assist Stand pivot transfers: Max assist       General transfer comment: Pt reliant on momentum and UE support to power into standing; bilateral knee buckling upon standing requiring modA to  maintain balance. Unsuccessful ambulation, requiring maxA to maintain balance with bilateral HHA to pivot to recliner  Ambulation/Gait Ambulation/Gait assistance: Max assist Gait Distance (Feet): 2 Feet Assistive device: Rolling walker (2 wheeled) Gait Pattern/deviations: Step-to pattern;Trunk flexed;Leaning posteriorly Gait velocity: Decreased   General Gait Details: Took side steps at EOB with RW; pt with bilateral knee instability and posterior lean requiring maxA to maintain trunk elevation, pt continuing to fall back onto bed  Stairs            Wheelchair Mobility    Modified Rankin (Stroke Patients Only)       Balance Overall balance assessment: Needs assistance   Sitting balance-Leahy Scale: Fair       Standing balance-Leahy Scale: Poor                               Pertinent Vitals/Pain Pain Assessment: No/denies pain    Home Living Family/patient expects to be discharged to:: Skilled nursing facility Living Arrangements: Children Available Help at Discharge: Family                  Prior Function           Comments: Pt poor historian, reports ambulatory without DME. Per previous PT notes, ambulates with RW     Hand Dominance        Extremity/Trunk Assessment   Upper Extremity Assessment Upper Extremity Assessment: Generalized weakness    Lower Extremity Assessment Lower Extremity Assessment: Generalized weakness    Cervical / Trunk Assessment Cervical / Trunk Assessment: Kyphotic  Communication   Communication: HOH  Cognition Arousal/Alertness: Awake/alert Behavior During Therapy:  WFL for tasks assessed/performed Overall Cognitive Status: History of cognitive impairments - at baseline Area of Impairment: Orientation;Attention;Memory;Following commands;Safety/judgement;Awareness;Problem solving                 Orientation Level: Disoriented to;Place;Time;Situation Current Attention Level: Sustained Memory:  Decreased short-term memory Following Commands: Follows one step commands consistently Safety/Judgement: Decreased awareness of deficits;Decreased awareness of safety Awareness: Intellectual Problem Solving: Slow processing;Requires verbal cues General Comments: Able to state name, could not recall birthday; able to state "hospital in [place]"; disoriented to other questions. Pt pleasantly confused with apparent memory deficits, but following simple commands appropraitely. "I'm a Psychologist, occupational... 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 Would I have pain if I'm a coach?"      General Comments      Exercises     Assessment/Plan    PT Assessment Patient needs continued PT services  PT Problem List Decreased balance;Decreased strength;Decreased activity tolerance;Decreased mobility;Decreased knowledge of use of DME;Decreased cognition;Decreased safety awareness       PT Treatment Interventions DME instruction;Gait training;Stair training;Therapeutic exercise;Therapeutic activities;Functional mobility training;Balance training;Cognitive remediation;Patient/family education    PT Goals (Current goals can be found in the Care Plan section)  Acute Rehab PT Goals Patient Stated Goal: Get some chocolate ice cream PT Goal Formulation: With patient Time For Goal Achievement: 04/05/18 Potential to Achieve Goals: Fair    Frequency Min 2X/week   Barriers to discharge        Co-evaluation               AM-PAC PT "6 Clicks" Mobility  Outcome Measure Help needed turning from your back to your side while in a flat bed without using bedrails?: A Little Help needed moving from lying on your back to sitting on the side of a flat bed without using bedrails?: A Little Help needed moving to and from a bed to a chair (including a wheelchair)?: A Lot Help needed standing up from a chair using your arms (e.g., wheelchair or bedside chair)?: A Lot Help needed to walk in  hospital room?: A Lot Help needed climbing 3-5 steps with a railing? : Total 6 Click Score: 13    End of Session Equipment Utilized During Treatment: Gait belt Activity Tolerance: Patient limited by fatigue Patient left: in chair;with call bell/phone within reach;with chair alarm set Nurse Communication: Mobility status PT Visit Diagnosis: Other abnormalities of gait and mobility (R26.89);Muscle weakness (generalized) (M62.81)    Time: 2423-5361 PT Time Calculation (min) (ACUTE ONLY): 25 min   Charges:   PT Evaluation $PT Eval Moderate Complexity: 1 Mod PT Treatments $Therapeutic Activity: 8-22 mins      Ina Homes, PT, DPT Acute Rehabilitation Services  Pager [phone] Office [phone]  Malachy Chamber 03/22/2018, 3:07 PM

## 2018-03-22 NOTE — ED Notes (Signed)
Nurse collected labs. 

## 2018-03-22 NOTE — ED Triage Notes (Signed)
BIB family with concern for confusion, weakness, and possible hyponatremia and/or UTI. Family denies urinary frequency, but reports strong smell. Pt required two-person assist to move from wheelchair to stretcher. Pt A&Ox3, denying any pain or SOB. LKW >24hrs ago, approx bedtime on 12/24.

## 2018-03-22 NOTE — H&P (Signed)
History and Physical    Hunter Choi:[id number] DOB: 1927-11-17 DOA: 03/22/2018  PCP: [name], MD Consultants:  Katrinka Blazing - cardiology; Edilia Bo - vascular surgery; Wilson - surgery Patient coming from:  Home - lives with sons; NOK: Sons  Chief Complaint: AMS  HPI: Hunter Choi is a 84 y.o. male with medical history significant of hypertension, stroke, GERD, hypothyroidism, AAA, BPH, CAD, CHF with EF of 30%, PAF, s/p of TAVR, RA presenting with AMS.  He reports that he has been woozy.  He "thought it was sugar but it's not sugar."  He feels good right now but feels a little woozy from not sleeping last night.  No pain.   He was previously hospitalized in 9/19 for acute on chronic combined CHF.  He was discharged to SNF but has since returned to home.  ED Course: Carryover, per Dr. Marshun Lundborg:  Weakness, AMS.  Etiology is not clear.  Urinalysis negative.  Troponin 0.04 which is chronic issue.  BNP 1508, which is not hight to pt.  Patient is taking high-dose methotrexate 25 mg weekly for rheumatoid arthritis. ED physician suspects patient may have methotrexate toxicity. Pt is placed on tele bed for obs.      Review of Systems: As per HPI; otherwise review of systems reviewed and negative.   Ambulatory Status:  Ambulates with a walker  Past Medical History:  Diagnosis Date  . AAA (abdominal aortic aneurysm) (HCC)    Dr. Ashley Royalty  . Aortic stenosis, severe    a. s/p TAVR  . Blind left eye   . BPH (benign prostatic hypertrophy)    Dr. Retta Diones  . CAD (coronary artery disease) of bypass graft    Chronic occlusion of saphenous vein graft to RCA  . Coronary artery disease    Left main disease and severe 3-vessel CAD  . Diastolic heart failure (HCC)   . Embolism involving retinal artery   . GERD (gastroesophageal reflux disease)   . Hard of hearing   . Hypertension   . Hypothyroidism   . Iliac artery aneurysm (HCC)    Dr. Edilia Bo  . PAF (paroxysmal atrial  fibrillation) (HCC)   . 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 12/2012: mm Stephannie Peters XT transcatheter heart valve placed via transapical approach  . Stroke [facility]    mini stroke effective his eye left    Past Surgical History:  Procedure Laterality Date  . BIOPSY PROSTATE  2005  . BUNIONECTOMY WITH HAMMERTOE RECONSTRUCTION Bilateral   . CARDIAC CATHETERIZATION    . CARDIAC CATHETERIZATION N/A 05/07/2015   Procedure: Right Heart Cath and Coronary/Graft Angiography;  Surgeon: Lyn Records, MD;  Location: [facility] INVASIVE CV LAB;  Service: Cardiovascular;  Laterality: N/A;  . CAROTID ENDARTERECTOMY Right Jan. 2, 1997  . CATARACT EXTRACTION W/ INTRAOCULAR LENS IMPLANT Right   . CHOLECYSTECTOMY N/A 01/28/2015   Procedure: LAPAROSCOPIC CHOLECYSTECTOMY WITH INTRAOPERATIVE CHOLANGIOGRAM;  Surgeon: Gaynelle Adu, MD;  Location: [facility] OR;  Service: General;  Laterality: N/A;  . COLONOSCOPY    . CORONARY ARTERY BYPASS GRAFT  01/1998   Dr. Sheliah Plane; LIMA to LAD, SVG to D1, SVG to LCx, SVG to RCA, open saphenous vein harvest via right lower extremity  . HARVEST BONE GRAFT  1982   FOR THE  ANKLE  . HEMIARTHROPLASTY HIP Left    AFTER HIP FX  . INTRAOPERATIVE TRANSESOPHAGEAL ECHOCARDIOGRAM N/A 01/15/2013   Procedure: INTRAOPERATIVE TRANSESOPHAGEAL ECHOCARDIOGRAM;  Surgeon: [name], MD;  Location:  [facility] OR;  Service: Open Heart Surgery;  Laterality: N/A;  . JOINT REPLACEMENT    . LEFT AND RIGHT HEART CATHETERIZATION WITH CORONARY ANGIOGRAM N/A 12/20/2012   Procedure: LEFT AND RIGHT HEART CATHETERIZATION WITH CORONARY ANGIOGRAM;  Surgeon: Lesleigh Noe, MD;  Location: [facility] CATH LAB;  Service: Cardiovascular;  Laterality: N/A;  . LEFT HEART CATH AND CORS/GRAFTS ANGIOGRAPHY N/A 02/14/2017   Procedure: LEFT HEART CATH AND CORS/GRAFTS ANGIOGRAPHY;  Surgeon: Marykay Lex, MD;  Location: [facility] INVASIVE CV LAB;  Service: Cardiovascular;  Laterality: N/A;  . SKIN GRAFTS  1968    . TOTAL HIP ARTHROPLASTY Left 2007  . TOTAL KNEE ARTHROPLASTY  1992-2002   right-left  . TRANSCATHETER AORTIC VALVE REPLACEMENT, TRANSAPICAL N/A 01/15/2013   Procedure: TRANSCATHETER AORTIC VALVE REPLACEMENT, TRANSAPICAL;  Surgeon: [name], MD;  Location: [facility] OR;  Service: Open Heart Surgery;  Laterality: N/A;  . TRANSURETHRAL RESECTION OF PROSTATE  2012    Social History   Socioeconomic History  . Marital status: Married    Spouse name: Not on file  . Number of children: Not on file  . Years of education: Not on file  . Highest education level: Not on file  Occupational History  . Occupation: Retired  Engineer, production  . Financial resource strain: Not on file  . Food insecurity:    Worry: Not on file    Inability: Not on file  . Transportation needs:    Medical: Not on file    Non-medical: Not on file  Tobacco Use  . Smoking status: Former Smoker    Packs/day: 0.50    Years: 37.00    Pack years: 18.50    Types: Cigarettes    Last attempt to quit: 03/29/1963    Years since quitting: 55.0  . Smokeless tobacco: Former Neurosurgeon    Types: Chew  Substance and Sexual Activity  . Alcohol use: No  . Drug use: No  . Sexual activity: Not on file  Lifestyle  . Physical activity:    Days per week: Not on file    Minutes per session: Not on file  . Stress: Not on file  Relationships  . Social connections:    Talks on phone: Not on file    Gets together: Not on file    Attends religious service: Not on file    Active member of club or organization: Not on file    Attends meetings of clubs or organizations: Not on file    Relationship status: Not on file  . Intimate partner violence:    Fear of current or ex partner: Not on file    Emotionally abused: Not on file    Physically abused: Not on file    Forced sexual activity: Not on file  Other Topics Concern  . Not on file  Social History Narrative  . Not on file    Allergies  Allergen Reactions  . Ciprofloxacin Other  (See Comments)    REACTION: mild delirium    Family History  Problem Relation Age of Onset  . Heart attack Mother   . Heart disease Mother        Before age 3  . Hyperlipidemia Mother   . Hypertension Mother   . Hypertension Son   . Heart disease Father        AAA-   . Hyperlipidemia Father   . Heart attack Father   . Hypertension Father   . Heart disease Brother  Before age 61  . Hyperlipidemia Brother   . Heart attack Brother   . Hypertension Brother     Prior to Admission medications   Medication Sig Start Date End Date Taking? Authorizing Provider  acetaminophen (TYLENOL) 500 MG tablet Take 1,000 mg by mouth 2 (two) times daily.   Yes [provider]  amLODipine (NORVASC) 5 MG tablet Take 5 mg by mouth daily.   Yes [provider]  aspirin EC 81 MG EC tablet Take 1 tablet (81 mg total) by mouth daily. 01/21/13  Yes Doree Fudge M, PA-C  atorvastatin (LIPITOR) 20 MG tablet Take 20 mg by mouth daily.   Yes [provider]  carvedilol (COREG) 6.25 MG tablet Take 1 tablet (6.25 mg total) by mouth 2 (two) times daily with a meal. 10/21/15  Yes Lyn Records, MD  clopidogrel (PLAVIX) 75 MG tablet Take 1 tablet (75 mg total) by mouth daily. 04/19/17  Yes Leone Brand, NP  folic acid (FOLVITE) 1 MG tablet Take 3 mg by mouth daily.   Yes [provider]  furosemide (LASIX) 20 MG tablet TAKE 2 TABLETS BY MOUTH ONE TIME A DAY Patient taking differently: Take 40 mg by mouth daily.  02/19/18  Yes Lyn Records, MD  lactose free nutrition (BOOST) LIQD Take 237 mLs daily by mouth.   Yes [provider]  levothyroxine (SYNTHROID, LEVOTHROID) 50 MCG tablet Take 50 mcg by mouth daily. 11/06/14  Yes [provider]  loratadine (CLARITIN) 10 MG tablet Take 10 mg by mouth daily. 06/29/16  Yes [provider]  methotrexate 2.5 MG tablet Take 25 mg by mouth every Friday.    Yes [provider]  nitroGLYCERIN  (NITROSTAT) 0.4 MG SL tablet PLACE 1 TABLET (0.4 MG TOTAL) UNDER THE TONGUE EVERY 5 MINUTES AS NEEDED FOR CHEST PAIN Patient taking differently: Place 0.4 mg under the tongue every 5 (five) minutes as needed.  12/21/16  Yes Lyn Records, MD  pantoprazole (PROTONIX) 40 MG tablet Take 1 tablet (40 mg total) by mouth daily. 02/18/17  Yes Azalee Course, PA  predniSONE (DELTASONE) 5 MG tablet Take 5 mg by mouth daily. 04/07/15  Yes [provider]  acetaminophen (TYLENOL) 325 MG tablet Take 2 tablets (650 mg total) by mouth every 6 (six) hours as needed for mild pain, moderate pain, fever or headache. Patient not taking: Reported on 03/22/2018 02/03/15   Elease Etienne, MD    Physical Exam: Vitals:   03/22/18 0630 03/22/18 0700 03/22/18 0730 03/22/18 0755  BP: 136/72 136/67 119/62 115/67  Pulse: 66 61 (!) 58 65  Resp: (!) 28 15 16 18   Temp:    98.2 F (36.8 C)  TempSrc:    Oral  SpO2: 100% 100% 100% 99%  Weight:    66.8 kg  Height:    5\' 7"  (1.702 m)     General:  Appears calm and comfortable and is NAD Eyes:   EOMI, normal lids, iris ENT:  Hard of hearing, normal lips & tongue, mmm Neck:  no LAD, masses or thyromegaly Cardiovascular:  RRR, no m/r/g. No LE edema.  Respiratory:   CTA bilaterally with no wheezes/rales/rhonchi.  Normal respiratory effort. Abdomen:  soft, NT, ND, NABS Skin:  no rash or induration seen on limited exam Musculoskeletal:  grossly normal tone BUE/BLE, good ROM, no bony abnormality Lower extremity:  No LE edema.  Limited foot exam with no ulcerations.  2+ distal pulses. Psychiatric:  grossly normal mood  and affect, speech fluent and appropriate, AOx3.  He appears to have mild dementia but overall is quite able to converse without difficulty.   Neurologic:  CN 2-12 grossly intact, moves all extremities in coordinated fashion, sensation intact    Radiological Exams on Admission: No results found.  EKG: Independently reviewed.  NSR with rate 70; LBBB  with NSCSLT  Labs on Admission: I have personally reviewed the available labs and imaging studies at the time of the admission.  Pertinent labs:   Glucose 117 BUN 32/Creatinine 1.59/GFR 38 - stable BNP 1508.9; 2836.3 on 9/14 Troponin 0.04; 0.13 on 5/14 WBC 9.8 Hgb 10.5 UA: small Hgb, otherwise WNL   Assessment/Plan Principal Problem:   Acute metabolic encephalopathy Active Problems:   Rheumatoid arthritis (HCC)   Thyroid disease   Essential hypertension   Elevated troponin   CKD (chronic kidney disease) stage 3, GFR 30-59 ml/min (HCC)    AMS -Patient presenting with encephalopathy as evidenced by his reported confusion and generalized weakness -Evaluation thus far unremarkable -There is no evidence of infection at this time -ER physician raised concern for medication misadventure, possibly methotrexate toxicity; there was no MTX level checked and so this has been added on. -Based on unremarkable evaluation with ability to protect his airway, will observe for now on telemetry -With recent hallucinations, suspicion would be higher for dementia with Lewy bodies; if this is the case, the problem would like be anticipated to worsen over time without significant opportunity for improvement.  RA -He takes methotrexate on Fridays; checking level -Continue daily prednisone; there does not appear to be an indication for stress-dosed steroids at this time  Hypothyroidism -Normal TSH in 5/19 -Continue Synthroid at current dose for now  HTN -Continue Norvasc and Coreg  Elevated troponin/BNP -No current evidence of acute CHF, appears euvolemic and asymptomatic -Denies chest pain -Will trend troponin but low suspicion for ACS -BMP appears to be lower than usual baseline  Stage 3 CKD -Appears to be stable   DVT prophylaxis:   Lovenox  Code Status:  Full - confirmed with patient Family Communication: None present Disposition Plan:  Home once clinically improved Consults  called: PT  Admission status: It is my clinical opinion that referral for OBSERVATION is reasonable and necessary in this patient based on the above information provided. The aforementioned taken together are felt to place the patient at high risk for further clinical deterioration. However it is anticipated that the patient may be medically stable for discharge from the hospital within 24 to 48 hours.    Jonah Blue MD [facility]  If note is complete, please contact covering daytime or nighttime physician. [email or web address] Password [facility]  03/22/2018, 8:45 AM

## 2018-03-22 NOTE — ED Notes (Signed)
CBG: 113 °

## 2018-03-22 NOTE — ED Notes (Signed)
ED Provider at bedside. 

## 2018-03-23 ENCOUNTER — Encounter (HOSPITAL_COMMUNITY): Payer: Self-pay | Admitting: Nephrology

## 2018-03-23 DIAGNOSIS — Z952 Presence of prosthetic heart valve: Secondary | ICD-10-CM

## 2018-03-23 DIAGNOSIS — G9341 Metabolic encephalopathy: Secondary | ICD-10-CM | POA: Diagnosis not present

## 2018-03-23 DIAGNOSIS — N183 Chronic kidney disease, stage 3 (moderate): Secondary | ICD-10-CM

## 2018-03-23 DIAGNOSIS — I10 Essential (primary) hypertension: Secondary | ICD-10-CM

## 2018-03-23 DIAGNOSIS — I25810 Atherosclerosis of coronary artery bypass graft(s) without angina pectoris: Secondary | ICD-10-CM | POA: Diagnosis not present

## 2018-03-23 DIAGNOSIS — M069 Rheumatoid arthritis, unspecified: Secondary | ICD-10-CM

## 2018-03-23 DIAGNOSIS — R410 Disorientation, unspecified: Secondary | ICD-10-CM

## 2018-03-23 DIAGNOSIS — E079 Disorder of thyroid, unspecified: Secondary | ICD-10-CM

## 2018-03-23 DIAGNOSIS — R531 Weakness: Secondary | ICD-10-CM

## 2018-03-23 LAB — BASIC METABOLIC PANEL
Anion gap: 8 (ref 5–15)
BUN: 31 mg/dL — ABNORMAL HIGH (ref 8–23)
CO2: 26 mmol/L (ref 22–32)
Calcium: 8.5 mg/dL — ABNORMAL LOW (ref 8.9–10.3)
Chloride: 100 mmol/L (ref 98–111)
Creatinine, Ser: 1.37 mg/dL — ABNORMAL HIGH (ref 0.61–1.24)
GFR calc Af Amer: 53 mL/min — ABNORMAL LOW (ref >60)
GFR calc non Af Amer: 46 mL/min — ABNORMAL LOW (ref >60)
Glucose, Bld: 95 mg/dL (ref 70–99)
Potassium: 4.3 mmol/L (ref 3.5–5.1)
SODIUM: 134 mmol/L — AB (ref 135–145)

## 2018-03-23 LAB — CBC
HCT: 30.6 % — ABNORMAL LOW (ref 39.0–52.0)
Hemoglobin: 9.4 g/dL — ABNORMAL LOW (ref 13.0–17.0)
MCH: 27.4 pg (ref 26.0–34.0)
MCHC: 30.7 g/dL (ref 30.0–36.0)
MCV: 89.2 fL (ref 80.0–100.0)
PLATELETS: 148 10*3/uL — AB (ref 150–400)
RBC: 3.43 MIL/uL — ABNORMAL LOW (ref 4.22–5.81)
RDW: 19 % — ABNORMAL HIGH (ref 11.5–15.5)
WBC: 7.9 10*3/uL (ref 4.0–10.5)
nRBC: 0 % (ref 0.0–0.2)

## 2018-03-23 LAB — GLUCOSE, CAPILLARY: Glucose-Capillary: 81 mg/dL (ref 70–99)

## 2018-03-23 MED ORDER — ENSURE ENLIVE PO LIQD
237.0000 mL | Freq: Three times a day (TID) | ORAL | Status: DC
  Administered 2018-03-23 – 2018-03-27 (×9): 237 mL via ORAL

## 2018-03-23 MED ORDER — SODIUM CHLORIDE 0.45 % IV SOLN
INTRAVENOUS | Status: DC
  Administered 2018-03-23 – 2018-03-27 (×3): via INTRAVENOUS

## 2018-03-23 MED ORDER — PRO-STAT SUGAR FREE PO LIQD
30.0000 mL | Freq: Two times a day (BID) | ORAL | Status: DC
  Administered 2018-03-23 – 2018-03-27 (×8): 30 mL via ORAL
  Filled 2018-03-23 (×8): qty 30

## 2018-03-23 MED ORDER — ENOXAPARIN SODIUM 30 MG/0.3ML ~~LOC~~ SOLN
30.0000 mg | SUBCUTANEOUS | Status: DC
  Administered 2018-03-23 – 2018-03-26 (×4): 30 mg via SUBCUTANEOUS
  Filled 2018-03-23 (×4): qty 0.3

## 2018-03-23 MED ORDER — ADULT MULTIVITAMIN W/MINERALS CH
1.0000 | ORAL_TABLET | Freq: Every day | ORAL | Status: DC
  Administered 2018-03-24 – 2018-03-27 (×4): 1 via ORAL
  Filled 2018-03-23 (×4): qty 1

## 2018-03-23 NOTE — Progress Notes (Signed)
Physical Therapy Treatment Patient Details Name: Hunter Choi MRN: [id number] DOB: June 22, 1927 Today's Date: 03/23/2018    History of Present Illness Pt is an 84 y.o male admitted from home on 03/22/18 with AMS; etiology unclear, UTI negative. Suspect potential methotrexate toxicity as pt taking high-dose for rehumatoid arthritis. Also suspicion for dementia with Lewy bodies. Of note, recent hospitalization 11/2017 for CHF with d/c to SNF. PMH includes HTN, CAD, CVA, CHF, PAF, s/p TAVR, RA.   PT Comments    Pt slowly progressing with mobility. No hearing aid battery this session and pt very HOH, but able to communicate ok with minimal verbalizations and more gesturing. Pt required increased assist for bed mobility this session compared to yesterday. ModA to stand and pivot to recliner; pt with bilateral knee instability. Continue to recommend SNF-level therapies to maximize functional mobility and independence.     Follow Up Recommendations  SNF;Supervision/Assistance - 24 hour     Equipment Recommendations  Rolling walker with 5" wheels    Recommendations for Other Services       Precautions / Restrictions Precautions Precautions: Fall Restrictions Weight Bearing Restrictions: No    Mobility  Bed Mobility Overal bed mobility: Needs Assistance Bed Mobility: Supine to Sit     Supine to sit: Mod assist     General bed mobility comments: ModA for UE support to assist trunk elevation  Transfers Overall transfer level: Needs assistance Equipment used: 1 person hand held assist Transfers: Sit to/from [employer] Transfers Sit to Stand: Mod assist Stand pivot transfers: Mod assist       General transfer comment: Pt able to stand with bilateral UE support on therapist and modA to assist trunk elevation; able to reach to recliner for UE support to perform stand pivot with modA  Ambulation/Gait                 Stairs             Wheelchair Mobility     Modified Rankin (Stroke Patients Only)       Balance Overall balance assessment: Needs assistance   Sitting balance-Leahy Scale: Fair       Standing balance-Leahy Scale: Poor                              Cognition Arousal/Alertness: Awake/alert Behavior During Therapy: WFL for tasks assessed/performed Overall Cognitive Status: History of cognitive impairments - at baseline Area of Impairment: Orientation;Attention;Memory;Following commands;Safety/judgement;Awareness;Problem solving                 Orientation Level: Disoriented to;Time;Situation Current Attention Level: Sustained Memory: Decreased short-term memory Following Commands: Follows one step commands consistently Safety/Judgement: Decreased awareness of deficits;Decreased awareness of safety Awareness: Intellectual Problem Solving: Slow processing;Requires verbal cues General Comments: Pt reports hearing aid battery out, very HOH; able to communicate okay with one word/gesturing to pt      Exercises      General Comments        Pertinent Vitals/Pain Pain Assessment: No/denies pain    Home Living                      Prior Function            PT Goals (current goals can now be found in the care plan section) Acute Rehab PT Goals Patient Stated Goal: Get some chocolate milk PT Goal Formulation: With patient Time For Goal Achievement: 04/05/18  Potential to Achieve Goals: Fair Progress towards PT goals: Progressing toward goals    Frequency    Min 2X/week      PT Plan Current plan remains appropriate    Co-evaluation              AM-PAC PT "6 Clicks" Mobility   Outcome Measure  Help needed turning from your back to your side while in a flat bed without using bedrails?: A Little Help needed moving from lying on your back to sitting on the side of a flat bed without using bedrails?: A Lot Help needed moving to and from a bed to a chair (including a  wheelchair)?: A Lot Help needed standing up from a chair using your arms (e.g., wheelchair or bedside chair)?: A Lot Help needed to walk in hospital room?: A Lot Help needed climbing 3-5 steps with a railing? : Total 6 Click Score: 12    End of Session Equipment Utilized During Treatment: Gait belt Activity Tolerance: Patient limited by fatigue Patient left: in chair;with call bell/phone within reach;with chair alarm set Nurse Communication: Mobility status PT Visit Diagnosis: Other abnormalities of gait and mobility (R26.89);Muscle weakness (generalized) (M62.81)     Time: 1950-9326 PT Time Calculation (min) (ACUTE ONLY): 19 min  Charges:  $Therapeutic Activity: 8-22 mins                    Ina Homes, PT, DPT Acute Rehabilitation Services  Pager [phone] Office [phone]  Malachy Chamber 03/23/2018, 4:26 PM

## 2018-03-23 NOTE — Progress Notes (Signed)
Patient has refused lab work three times this morning. Patient states he doesn't want to be stuck.

## 2018-03-23 NOTE — Progress Notes (Signed)
[facility] Progress Note  Subjective: pt very HOH, minimally responds to voice, sleeping almost al the time  Vitals:   03/22/18 2014 03/22/18 2338 03/23/18 0445 03/23/18 0824  BP: (!) 112/58 (!) 129/57 121/61 103/77  Pulse: 60 (!) 56 66 77  Resp: 16 18 18 19   Temp: (!) 97.3 F (36.3 C) 97.8 F (36.6 C) 97.8 F (36.6 C)   TempSrc: Oral Oral Oral   SpO2: 98% 98% 100% 97%  Weight:   44.9 kg   Height:        Inpatient medications: . acetaminophen  1,000 mg Oral BID  . amLODipine  5 mg Oral Daily  . aspirin EC  81 mg Oral Daily  . atorvastatin  20 mg Oral Daily  . carvedilol  6.25 mg Oral BID WC  . clopidogrel  75 mg Oral Daily  . docusate sodium  100 mg Oral BID  . enoxaparin (LOVENOX) injection  40 mg Subcutaneous Q24H  . folic acid  3 mg Oral Daily  . furosemide  40 mg Oral Daily  . lactose free nutrition  237 mL Oral Daily  . levothyroxine  50 mcg Oral Q0600  . loratadine  10 mg Oral Daily  . pantoprazole  40 mg Oral Daily  . predniSONE  5 mg Oral Q breakfast  . sodium chloride flush  3 mL Intravenous Q12H    acetaminophen **OR** acetaminophen, ondansetron **OR** ondansetron (ZOFRAN) IV  Exam: Disheveled WM, elderly, lying flat asleep, snoring, difficult to arouse and when awake says very little then falls back asleep. No jerking or seizures, no cough or resp distress  No jvd Chest cta bilat no rales or wheezing Cor irreg irreg w/o rub or gallop, soft sem Abd soft ntnd no hsm or ascites no mass  Ext no edema, no ulcers , has all toes  Neuro hearing aid R ear, very HOH, moves all ext x 3     Home meds:  - amlodipine 5/ carvedilol 6.25 bid/ furosemide 40 qd  - aspirin 81 qd/ clopidogrel 75 qd/ sl ntg prn  - prednisone 5 mg qd/ methotrexate 25mg  q Friday  - levothyroxine 50 qd/ pantoprazole 40/ prns/ vitamins  Presentation Summary: Hunter Choi is a 84 y.o. male with medical history significant of hypertension, stroke, GERD, hypothyroidism, AAA, BPH,  CAD, CHF with EF of 30%, PAF,s/p of TAVR, RA presenting with AMS.  He reports that he has been woozy.  He "thought it was sugar but it's not sugar."  He feels good right now but feels a little woozy from not sleeping last night.  No pain  .  He was previously hospitalized in 9/19 for acute on chronic combined CHF.  He was discharged to SNF but has since returned to home.  ED Course: Carryover, per Dr. 98: Weakness, AMS.  Etiology is not clear. Urinalysis negative. Troponin 0.04 which is chronic issue. BNP 1508 which is low for this patient (usual 1800 - 4500).Patient is taking high-dose methotrexate 25 mg weekly for rheumatoid arthritis.ED physician suspectspatient may have methotrexate toxicity. Pt is placed on tele bed for obs.          Hospital Problems/ Course:  AMS -Patient presenting with encephalopathy as evidenced by his reported confusion and generalized weakness - Exam is negative except for HOH -There is no evidence of infection at this time - old notes show suggest possible underlying dementia - Telemetry showing sinus bradycardia, BP's here are normal.  - With recent hallucinations this may be  dementia related; along w/ severe HOH and visual loss/ blindness very difficult to communicate - ER physician raised concern for MTX toxicity, drug level sent off result pend - unable to reach family today, keep her and await MTX level - PT consult  -  Cont meds except lasix and MTX for now  RA -He takes 25 mg methotrexate on Fridays; checking level -Continue daily prednisone; there does not appear to be an indication for stress-dosed steroids at this time  Hx CABG ~1999 / CM EF 30%/ sp TAVR 2014 - has known occlusion of the SVG graft 100%. Poor candidate for PCI per cardiology notes from 07/2017.    Hypothyroidism -Normal TSH in 5/19 -Continue Synthroid at current dose for now  HTN -Continue Norvasc and Coreg  Elevated troponin/BNP -No current evidence of acute CHF,  appears euvolemic and asymptomatic -Denies chest pain -Will trend troponin but low suspicion for ACS -BMP appears to be lower than usual baseline  Stage 3 CKD -Appears to be stable  Blind - hx of retinal art embolism L side  HOH - not sure how much of this is contributing to picture of AMS along w/ dementia and blindness   DVT prophylaxis:  Lovenox  Code Status:  Full Family Communication: None present, unable to reach by phone Disposition Plan:  Home once clinically improved Consults called: PT     Vinson Moselle MD [facility] pgr [phone] 03/23/2018, 11:05 AM   Recent Labs  Lab 03/22/18 0359  NA 135  K 5.0  CL 99  CO2 25  GLUCOSE 117*  BUN 32*  CREATININE 1.59*  CALCIUM 8.7*   Recent Labs  Lab 03/22/18 0359  AST 19  ALT 10  ALKPHOS 69  BILITOT 0.8  PROT 7.0  ALBUMIN 3.7   Recent Labs  Lab 03/22/18 0359  WBC 9.8  HGB 10.5*  HCT 35.3*  MCV 90.3  PLT 164   Iron/TIBC/Ferritin/ %Sat    Component Value Date/Time   IRON 20 (L) 05/24/2014 0614   TIBC 234 05/24/2014 0614   FERRITIN 121 05/24/2014 0614   IRONPCTSAT 9 (L) 05/24/2014 7517

## 2018-03-23 NOTE — Progress Notes (Signed)
Initial Nutrition Assessment  DOCUMENTATION CODES:   Underweight, Severe malnutrition in context of chronic illness  INTERVENTION:   -D/c Boost Plus -Ensure Enlive po BID, each supplement provides 350 kcal and 20 grams of protein -30 ml Prostat BID, each supplement provides 100 kcals and 15 grams protein -MVI with minerals daily  NUTRITION DIAGNOSIS:   Severe Malnutrition related to chronic illness(stroke, CHF) as evidenced by moderate fat depletion, severe fat depletion, moderate muscle depletion, severe muscle depletion, percent weight loss.  Ongoing  GOAL:   Patient will meet greater than or equal to 90% of their needs  Progressing  MONITOR:   PO intake, Supplement acceptance, Labs, Weight trends, I & O's  REASON FOR ASSESSMENT:   Other (Comment)(low BMI)    ASSESSMENT:    Hunter Choi is a 84 y.o. male with medical history significant of hypertension, stroke, GERD, hypothyroidism, AAA, BPH, CAD, CHF with EF of 30%, PAF, s/p of TAVR, RA presenting with AMS.  He reports that he has been woozy.  He "thought it was sugar but it's not sugar."  He feels good right now but feels a little woozy from not sleeping last night.  No pain.   Pt admitted with AMS.   Reviewed I/O's:-475 ml x 24 hours and -725 ml since admission  Case discussed with RN, who reports pt is confused and has been refusing most care. He attempted to swing at phlebotomist earlier today. Pt lives at home, but recently transitioned there from SNF stay,   Pt very lethargic at time of visit and did not respond to his name being called. Pt aroused briefly during exam and stated 'I'm gonna wake up soon and take my medicine". Attempted to engage pt in conversation when he woke up, but pt fell right back to sleep. No family at bedside to provide additional history.   Noted meal tray on tray table, which was untouched. Documented meal completion 10%.   Reviewed wt hx; noted pt has experienced a 32% wt loss over  the past 3 months, which is significant for time frame.   Medications reviewed and include prednisone.   Labs reviewed.   NUTRITION - FOCUSED PHYSICAL EXAM:    Most Recent Value  Orbital Region  Severe depletion  Upper Arm Region  Severe depletion  Thoracic and Lumbar Region  Moderate depletion  Buccal Region  Moderate depletion  Temple Region  Severe depletion  Clavicle Bone Region  Severe depletion  Clavicle and Acromion Bone Region  Moderate depletion  Scapular Bone Region  Moderate depletion  Dorsal Hand  Severe depletion  Patellar Region  Moderate depletion  Anterior Thigh Region  Moderate depletion  Posterior Calf Region  Severe depletion  Edema (RD Assessment)  Mild  Hair  Reviewed  Eyes  Reviewed  Mouth  Reviewed  Skin  Reviewed  Nails  Reviewed       Diet Order:   Diet Order            Diet Heart Room service appropriate? Yes; Fluid consistency: Thin  Diet effective now              EDUCATION NEEDS:   Not appropriate for education at this time  Skin:  Skin Assessment: Reviewed RN Assessment  Last BM:  03/22/18  Height:   Ht Readings from Last 1 Encounters:  03/22/18 5\' 7"  (1.702 m)    Weight:   Wt Readings from Last 1 Encounters:  03/23/18 44.9 kg    Ideal Body Weight:  67.3 kg  BMI:  Body mass index is 15.51 kg/m.  Estimated Nutritional Needs:   Kcal:  1550-1750  Protein:  85-100 grams  Fluid:  per MD    Linet Brash A. Mayford Knife, RD, LDN, CDE Pager: [phone] After hours Pager: [phone]

## 2018-03-23 NOTE — Progress Notes (Signed)
MD made aware of lab refusals.

## 2018-03-24 DIAGNOSIS — E43 Unspecified severe protein-calorie malnutrition: Secondary | ICD-10-CM | POA: Diagnosis present

## 2018-03-24 DIAGNOSIS — G9341 Metabolic encephalopathy: Secondary | ICD-10-CM | POA: Diagnosis not present

## 2018-03-24 NOTE — Clinical Social Work Note (Signed)
No supports at bedside. Left voicemail for son. Will discuss SNF placement when he calls back.  Charlynn Court, CSW [phone]

## 2018-03-24 NOTE — NC FL2 (Signed)
[place] MEDICAID FL2 LEVEL OF CARE SCREENING TOOL     IDENTIFICATION  Patient Name: Hunter Choi Birthdate: 07/29/1927 Sex: male Admission Date (Current Location): 03/22/2018  [facility] and [place] Number:  Producer, television/film/video and Address:  [facility] [place], [address] [place], [place] [address]      Provider Number: [id number]  Attending Physician Name and Address:  [name], MD  Relative Name and Phone Number:       Current Level of Care: Hospital Recommended Level of Care: Skilled Nursing Facility Prior Approval Number:    Date Approved/Denied:   PASRR Number: [id number]  Discharge Plan: SNF    Current Diagnoses: Patient Active Problem List   Diagnosis Date Noted  . Protein-calorie malnutrition, severe 03/24/2018  . Confusion   . Generalized weakness   . Acute metabolic encephalopathy 03/22/2018  . CKD (chronic kidney disease) stage 3, GFR 30-59 ml/min (HCC) 12/09/2017  . SIRS (systemic inflammatory response syndrome) (HCC) 08/03/2017  . Essential hypertension 08/03/2017  . Elevated troponin   . Sepsis due to urinary tract infection (HCC) 10/02/2015  . Hard of hearing   . Pressure ulcer 01/28/2015  . Acute on chronic combined systolic and diastolic congestive heart failure, NYHA class 4 (HCC) 01/27/2015  . Hyponatremia 11/21/2014  . Elevated LFTs 11/14/2014  . Anemia 05/23/2014  . Thrombocytopenia (HCC) 02/21/2014  . Malnutrition of moderate degree (HCC) 02/21/2014  . Occlusion and stenosis of carotid artery without mention of cerebral infarction 09/04/2013  . Aftercare following surgery of the circulatory system, NEC 09/04/2013  . AAA (abdominal aortic aneurysm) without rupture (HCC) 03/06/2013  . 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 Coronary artery disease involving coronary bypass graft of native heart with angina pectoris (HCC)   . GERD (gastroesophageal reflux disease)   . Thyroid disease    . Embolism involving retinal artery   . Iliac artery aneurysm (HCC)   . Rheumatoid arthritis (HCC) 04/22/2010    Orientation RESPIRATION BLADDER Height & Weight     Self  Normal Continent, External catheter Weight: 146 lb 8 oz (66.5 kg) Height:  5\' 7"  (170.2 cm)  BEHAVIORAL SYMPTOMS/MOOD NEUROLOGICAL BOWEL NUTRITION STATUS  (None) (None) Continent Diet(Heart healthy)  AMBULATORY STATUS COMMUNICATION OF NEEDS Skin   Extensive Assist Verbally Skin abrasions, Bruising                       Personal Care Assistance Level of Assistance              Functional Limitations Info  Sight, Hearing, Speech Sight Info: Impaired Hearing Info: Impaired Speech Info: Adequate    SPECIAL CARE FACTORS FREQUENCY  PT (By licensed PT), Blood pressure     PT Frequency: 5 x week              Contractures Contractures Info: Not present    Additional Factors Info  Code Status, Allergies Code Status Info: Full Allergies Info: Ciprofloxacin.           Current Medications (03/24/2018):  This is the current hospital active medication list Current Facility-Administered Medications  Medication Dose Route Frequency Provider Last Rate Last Dose  . 0.45 % sodium chloride infusion   Intravenous Continuous 03/26/2018, MD 65 mL/hr at 03/24/18 0445    . acetaminophen (TYLENOL) tablet 650 mg  650 mg Oral Q6H PRN 03/26/18, MD       Or  . acetaminophen (TYLENOL) suppository 650  mg  650 mg Rectal Q6H PRN Jonah Blue, MD      . acetaminophen (TYLENOL) tablet 1,000 mg  1,000 mg Oral BID Jonah Blue, MD   1,000 mg at 03/24/18 1018  . amLODipine (NORVASC) tablet 5 mg  5 mg Oral Daily Jonah Blue, MD   5 mg at 03/24/18 1017  . aspirin EC tablet 81 mg  81 mg Oral Daily Jonah Blue, MD   81 mg at 03/24/18 1018  . atorvastatin (LIPITOR) tablet 20 mg  20 mg Oral Daily Jonah Blue, MD   20 mg at 03/24/18 1018  . carvedilol (COREG) tablet 6.25 mg  6.25 mg Oral BID WC  Jonah Blue, MD   6.25 mg at 03/24/18 0545  . clopidogrel (PLAVIX) tablet 75 mg  75 mg Oral Daily Jonah Blue, MD   75 mg at 03/24/18 1018  . docusate sodium (COLACE) capsule 100 mg  100 mg Oral BID Jonah Blue, MD   100 mg at 03/24/18 1024  . enoxaparin (LOVENOX) injection 30 mg  30 mg Subcutaneous Q24H Delano Metz, MD   30 mg at 03/23/18 2207  . feeding supplement (ENSURE ENLIVE) (ENSURE ENLIVE) liquid 237 mL  237 mL Oral TID BM Delano Metz, MD   237 mL at 03/24/18 1025  . feeding supplement (PRO-STAT SUGAR FREE 64) liquid 30 mL  30 mL Oral BID Delano Metz, MD   30 mL at 03/24/18 1019  . folic acid (FOLVITE) tablet 3 mg  3 mg Oral Daily Jonah Blue, MD   3 mg at 03/24/18 1017  . levothyroxine (SYNTHROID, LEVOTHROID) tablet 50 mcg  50 mcg Oral Q0600 Jonah Blue, MD   50 mcg at 03/24/18 0545  . loratadine (CLARITIN) tablet 10 mg  10 mg Oral Daily Jonah Blue, MD   10 mg at 03/24/18 1017  . multivitamin with minerals tablet 1 tablet  1 tablet Oral Daily Delano Metz, MD   1 tablet at 03/24/18 1018  . ondansetron (ZOFRAN) tablet 4 mg  4 mg Oral Q6H PRN Jonah Blue, MD       Or  . ondansetron [facility]) injection 4 mg  4 mg Intravenous Q6H PRN Jonah Blue, MD      . pantoprazole (PROTONIX) EC tablet 40 mg  40 mg Oral Daily Jonah Blue, MD   40 mg at 03/24/18 1018  . predniSONE (DELTASONE) tablet 5 mg  5 mg Oral Q breakfast Jonah Blue, MD   5 mg at 03/24/18 0545  . sodium chloride flush (NS) 0.9 % injection 3 mL  3 mL Intravenous Q12H Jonah Blue, MD   3 mL at 03/23/18 2208     Discharge Medications: Please see discharge summary for a list of discharge medications.  Relevant Imaging Results:  Relevant Lab Results:   Additional Information SS#: [id number]  Margarito Liner, LCSW

## 2018-03-24 NOTE — Clinical Social Work Note (Signed)
Clinical Social Work Assessment  Patient Details  Name: Hunter Choi MRN: [id number] Date of Birth: 01/16/1928  Date of referral:  03/24/18               Reason for consult:  Facility Placement, Discharge Planning                Permission sought to share information with:  Facility Medical sales representative, Family Supports Permission granted to share information::     Name::     Hunter Choi  Agency::  SNF's  Relationship::  Son  Contact Information:  [phone]  Housing/Transportation Living arrangements for the past 2 months:  Single Family Home Source of Information:  Medical Team, Adult Children Patient Interpreter Needed:  None Criminal Activity/Legal Involvement Pertinent to Current Situation/Hospitalization:  No - Comment as needed Significant Relationships:  Adult Children Lives with:  Adult Children Do you feel safe going back to the place where you live?  Yes Need for family participation in patient care:  Yes (Comment)  Care giving concerns:  PT recommending SNF once medically stable for discharge.   Social Worker assessment / plan:  Received call back from patient's son. CSW introduced role and explained that PT recommendations would be discussed. Patient's son agreeable to SNF placement. First preference is [facility]. Per son patient has been there twice this year already. Last admission was 9/20-around 10/11. Left message for admissions coordinator to notify. No further concerns. CSW encouraged patient's son to contact CSW as needed. CSW will continue to follow patient and his son for support and facilitate discharge to SNF once medically stable.  Employment status:  Retired Database administrator PT Recommendations:  Skilled Nursing Facility Information / Referral to community resources:  Skilled Nursing Facility  Patient/Family's Response to care:  Patient not fully oriented. Patient's son agreeable to SNF placement. Patient's son  supportive and involved in patient's care. Patient's son appreciated social work intervention.  Patient/Family's Understanding of and Emotional Response to Diagnosis, Current Treatment, and Prognosis:  Patient not fully oriented. Patient's son has a good understanding of the reason for admission and his need for rehab prior to returning home. Patient's son appears happy with hospital care.  Emotional Assessment Appearance:  Appears stated age Attitude/Demeanor/Rapport:  Unable to Assess Affect (typically observed):  Unable to Assess Orientation:  Oriented to Self Alcohol / Substance use:  Never Used Psych involvement (Current and /or in the community):  No (Comment)  Discharge Needs  Concerns to be addressed:  Care Coordination Readmission within the last 30 days:  No Current discharge risk:  Cognitively Impaired, Dependent with Mobility Barriers to Discharge:  Continued Medical Work up, Insurance Authorization   Margarito Liner, LCSW 03/24/2018, 12:58 PM

## 2018-03-24 NOTE — Progress Notes (Signed)
PROGRESS NOTE    Hunter Choi  OZD:[id number] DOB: 03-19-1928 DOA: 03/22/2018 PCP: [name], MD   Brief Narrative:   Hunter Choi a 84 y.o.malewith medical history significant ofhypertension, stroke, GERD, hypothyroidism, AAA, BPH, CAD, CHF with EF of 30%, PAF,s/p of TAVR, RApresenting with AMS. He was previously hospitalized in 9/19 for acute on chronic combined CHF. He was discharged to SNF but has since returned to home.  On admission etiology of patient's altered mental status was unclear.  Urinalysis was unremarkable he has a chronically elevated B NP and troponin.  Patient is on high-dose methotrexate for rheumatoid arthritis and ED physician has concerns at this may be methotrexate toxicity.  Patient placed in observation and seen by physical therapy who feel he requires skilled nursing facility placement.  Patient's encephalopathy is improving minimally.  He is extremely hard of hearing.  Has some possible underlying dementia, he has recently been hallucinating and there is concerned that along with severe hard of hearing and visual loss/blindness communication is very difficult.  Methotrexate level is pending. They are pursuing skilled rehab.    Assessment & Plan:   Principal Problem:   Acute metabolic encephalopathy Active Problems:   CKD (chronic kidney disease) stage 3, GFR 30-59 ml/min (HCC)   Thyroid disease   S/P TAVR (transcatheter aortic valve replacement)   Hard of hearing   Rheumatoid arthritis (HCC)   Essential hypertension   Elevated troponin   Confusion   Generalized weakness   Protein-calorie malnutrition, severe   AMS -Patient presenting with encephalopathy as evidenced by his reported confusion and generalized weakness - Exam is negative except for HOH -There is no evidence of infection at this time - old notes show suggest possible underlying dementia - Telemetry showing sinus bradycardia, BP's here are normal.  - With recent  hallucinations this may be dementia related; along w/ severe HOH and visual loss/ blindness very difficult to communicate - ER physician raised concern for MTX toxicity, drug level sent off result pend -Social worker spoke with family patient's son reports that he has been in skilled nursing facility 3 times this year. - PT consult commend skilled facility.  -  Cont meds except lasix and MTX for now; await methotrexate level  RA -He takes 25 mg methotrexate on Fridays; checking level -Continue daily prednisone; there does not appear to be an indication for stress-dosed steroids at this time  Hx CABG ~1999 / CM EF 30%/ sp TAVR 2014 - has known occlusion of the SVG graft 100%. Poor candidate for PCI per cardiology notes from 07/2017.    Hypothyroidism -Normal TSHin 5/19 -Continue Synthroid at current dose for now  HTN -Continue Norvasc and Coreg  Elevated troponin/BNP -No current evidence of acute CHF, appears euvolemic and asymptomatic -Denies chest pain -Will trend troponin but low suspicion for ACS -BMP appears to be lower than usual baseline  Stage 3 CKD -Appears to be stable  Blind - hx of retinal art embolism L side  HOH - not sure how much of this is contributing to picture of AMS along w/ dementia and blindness   DVT prophylaxis:Lovenox  Code Status:Full Family Communication:No one present, social worker spoken to family regarding placement Disposition Plan:Home once clinically improved Consults called:None   Subjective: Patient states he feels just about back to baseline.  He is confused.  Objective: Vitals:   03/23/18 1148 03/23/18 1937 03/24/18 0403 03/24/18 1237  BP: 112/63 (!) 115/58 128/63 (!) 115/52  Pulse: 61 61 65 60  Resp: 20 18 18 18   Temp: 98.5 F (36.9 C) 98.6 F (37 C) 98.4 F (36.9 C) 99 F (37.2 C)  TempSrc: Oral Oral Oral Oral  SpO2: 100% 100% 98% 99%  Weight:   66.5 kg   Height:        Intake/Output Summary (Last  24 hours) at 03/24/2018 1309 Last data filed at 03/24/2018 1011 Gross per 24 hour  Intake 1045 ml  Output 851 ml  Net 194 ml   Filed Weights   03/22/18 0755 03/23/18 0445 03/24/18 0403  Weight: 66.8 kg 44.9 kg 66.5 kg    Examination:  General exam: Appears calm and comfortable  Respiratory system: Clear to auscultation. Respiratory effort normal. Cardiovascular system: S1 & S2 heard, RRR. No JVD, murmurs, rubs, gallops or clicks. No pedal edema. Gastrointestinal system: Abdomen is nondistended, soft and nontender. No organomegaly or masses felt. Normal bowel sounds heard. Central nervous system: Alert and oriented. No focal neurological deficits. Extremities: Symmetric 5 x 5 power. Skin: No rashes, lesions or ulcers Psychiatry: Judgement and insight appear poor. Mood & affect appropriate.     Data Reviewed: I have personally reviewed following labs and imaging studies  CBC: Recent Labs  Lab 03/22/18 0359 03/23/18 1151  WBC 9.8 7.9  HGB 10.5* 9.4*  HCT 35.3* 30.6*  MCV 90.3 89.2  PLT 164 148*   Basic Metabolic Panel: Recent Labs  Lab 03/22/18 0359 03/23/18 1151  NA 135 134*  K 5.0 4.3  CL 99 100  CO2 25 26  GLUCOSE 117* 95  BUN 32* 31*  CREATININE 1.59* 1.37*  CALCIUM 8.7* 8.5*   GFR: Estimated Creatinine Clearance: 34.8 mL/min (A) (by C-G formula based on SCr of 1.37 mg/dL (H)). Liver Function Tests: Recent Labs  Lab 03/22/18 0359  AST 19  ALT 10  ALKPHOS 69  BILITOT 0.8  PROT 7.0  ALBUMIN 3.7   Cardiac Enzymes: Recent Labs  Lab 03/22/18 0359 03/22/18 0923 03/22/18 1508 03/22/18 1850  TROPONINI 0.04* 0.04* 0.03* 0.03*   CBG: Recent Labs  Lab 03/22/18 0343 03/23/18 0740  GLUCAP 113* 81    Radiology Studies: No results found.  Scheduled Meds: . acetaminophen  1,000 mg Oral BID  . amLODipine  5 mg Oral Daily  . aspirin EC  81 mg Oral Daily  . atorvastatin  20 mg Oral Daily  . carvedilol  6.25 mg Oral BID WC  . clopidogrel  75 mg  Oral Daily  . docusate sodium  100 mg Oral BID  . enoxaparin (LOVENOX) injection  30 mg Subcutaneous Q24H  . feeding supplement (ENSURE ENLIVE)  237 mL Oral TID BM  . feeding supplement (PRO-STAT SUGAR FREE 64)  30 mL Oral BID  . folic acid  3 mg Oral Daily  . levothyroxine  50 mcg Oral Q0600  . loratadine  10 mg Oral Daily  . multivitamin with minerals  1 tablet Oral Daily  . pantoprazole  40 mg Oral Daily  . predniSONE  5 mg Oral Q breakfast  . sodium chloride flush  3 mL Intravenous Q12H   Continuous Infusions: . sodium chloride 65 mL/hr at 03/24/18 0445     LOS: 0 days    Time spent: 38 minutes    03/26/18, MD FACP [facility] Pager [phone]  If 7PM-7AM, please contact night-coverage [email or web address] Password [email or web address] 03/24/2018, 1:10 PM

## 2018-03-24 NOTE — Clinical Social Work Placement (Signed)
   CLINICAL SOCIAL WORK PLACEMENT  NOTE  Date:  03/24/2018  Patient Details  Name: Hunter Choi MRN: [id number] Date of Birth: 1927-05-02  Clinical Social Work is seeking post-discharge placement for this patient at the Skilled  Nursing Facility level of care (*CSW will initial, date and re-position this form in  chart as items are completed):      Patient/family provided with [facility] Clinical Social Work Department's list of facilities offering this level of care within the geographic area requested by the patient (or if unable, by the patient's family).      Patient/family informed of their freedom to choose among providers that offer the needed level of care, that participate in Medicare, Medicaid or managed care program needed by the patient, have an available bed and are willing to accept the patient.      Patient/family informed of [facility]'s ownership interest in [facility] and [facility], as well as of the fact that they are under no obligation to receive care at these facilities.  PASRR submitted to EDS on 03/24/18     PASRR number received on       Existing PASRR number confirmed on 03/24/18     FL2 transmitted to all facilities in geographic area requested by pt/family on 03/24/18     FL2 transmitted to all facilities within larger geographic area on       Patient informed that his/her managed care company has contracts with or will negotiate with certain facilities, including the following:            Patient/family informed of bed offers received.  Patient chooses bed at       Physician recommends and patient chooses bed at      Patient to be transferred to   on  .  Patient to be transferred to facility by       Patient family notified on   of transfer.  Name of family member notified:        PHYSICIAN Please sign FL2     Additional Comment:    _______________________________________________ Margarito Liner, LCSW 03/24/2018, 1:02  PM

## 2018-03-25 DIAGNOSIS — R410 Disorientation, unspecified: Secondary | ICD-10-CM | POA: Diagnosis present

## 2018-03-25 DIAGNOSIS — I5042 Chronic combined systolic (congestive) and diastolic (congestive) heart failure: Secondary | ICD-10-CM | POA: Diagnosis not present

## 2018-03-25 DIAGNOSIS — I714 Abdominal aortic aneurysm, without rupture: Secondary | ICD-10-CM | POA: Diagnosis not present

## 2018-03-25 DIAGNOSIS — E43 Unspecified severe protein-calorie malnutrition: Secondary | ICD-10-CM | POA: Diagnosis not present

## 2018-03-25 DIAGNOSIS — M069 Rheumatoid arthritis, unspecified: Secondary | ICD-10-CM | POA: Diagnosis not present

## 2018-03-25 DIAGNOSIS — K219 Gastro-esophageal reflux disease without esophagitis: Secondary | ICD-10-CM | POA: Diagnosis not present

## 2018-03-25 DIAGNOSIS — Z96653 Presence of artificial knee joint, bilateral: Secondary | ICD-10-CM | POA: Diagnosis not present

## 2018-03-25 DIAGNOSIS — H9190 Unspecified hearing loss, unspecified ear: Secondary | ICD-10-CM | POA: Diagnosis not present

## 2018-03-25 DIAGNOSIS — I429 Cardiomyopathy, unspecified: Secondary | ICD-10-CM | POA: Diagnosis not present

## 2018-03-25 DIAGNOSIS — I130 Hypertensive heart and chronic kidney disease with heart failure and stage 1 through stage 4 chronic kidney disease, or unspecified chronic kidney disease: Secondary | ICD-10-CM | POA: Diagnosis not present

## 2018-03-25 DIAGNOSIS — E538 Deficiency of other specified B group vitamins: Secondary | ICD-10-CM | POA: Diagnosis not present

## 2018-03-25 DIAGNOSIS — H5462 Unqualified visual loss, left eye, normal vision right eye: Secondary | ICD-10-CM | POA: Diagnosis not present

## 2018-03-25 DIAGNOSIS — E039 Hypothyroidism, unspecified: Secondary | ICD-10-CM | POA: Diagnosis not present

## 2018-03-25 DIAGNOSIS — N400 Enlarged prostate without lower urinary tract symptoms: Secondary | ICD-10-CM | POA: Diagnosis not present

## 2018-03-25 DIAGNOSIS — Z961 Presence of intraocular lens: Secondary | ICD-10-CM | POA: Diagnosis not present

## 2018-03-25 DIAGNOSIS — I25709 Atherosclerosis of coronary artery bypass graft(s), unspecified, with unspecified angina pectoris: Secondary | ICD-10-CM | POA: Diagnosis not present

## 2018-03-25 DIAGNOSIS — G9341 Metabolic encephalopathy: Secondary | ICD-10-CM | POA: Diagnosis not present

## 2018-03-25 DIAGNOSIS — Z96642 Presence of left artificial hip joint: Secondary | ICD-10-CM | POA: Diagnosis not present

## 2018-03-25 DIAGNOSIS — N183 Chronic kidney disease, stage 3 (moderate): Secondary | ICD-10-CM | POA: Diagnosis not present

## 2018-03-25 MED ORDER — FUROSEMIDE 40 MG PO TABS
40.0000 mg | ORAL_TABLET | Freq: Every day | ORAL | Status: DC
  Administered 2018-03-25 – 2018-03-27 (×3): 40 mg via ORAL
  Filled 2018-03-25 (×3): qty 1

## 2018-03-25 NOTE — Progress Notes (Addendum)
PROGRESS NOTE    Hunter Choi  PJK:[id number] DOB: 08/24/1927 DOA: 03/22/2018 PCP: [name], MD   Brief Narrative:   Hunter Choi a 84 y.o.malewith medical history significant ofhypertension, stroke, GERD, hypothyroidism, AAA, BPH, CAD, CHF with EF of 30%, PAF,s/p of TAVR, RApresenting with AMS.He was previously hospitalized in 9/19 for acute on chronic combined CHF. He was discharged to SNF but has since returned to home.  On admission etiology of patient's altered mental status was unclear.  Urinalysis was unremarkable he has a chronically elevated B NP and troponin.  Patient is on high-dose methotrexate for rheumatoid arthritis and ED physician has concerns at this may be methotrexate toxicity.  Patient placed in observation and seen by physical therapy who feel he requires skilled nursing facility placement.  Patient's encephalopathy is improving minimally.  He is extremely hard of hearing.  Has some possible underlying dementia, he has recently been hallucinating and there is concerned that along with severe hard of hearing and visual loss/blindness communication is very difficult.  Methotrexate level is pending. We are pursuing skilled rehab. Patient is being converted from observation to inpatient stay.   Assessment & Plan:   Principal Problem:   Acute metabolic encephalopathy Active Problems:   CKD (chronic kidney disease) stage 3, GFR 30-59 ml/min (HCC)   Thyroid disease   S/P TAVR (transcatheter aortic valve replacement)   Hard of hearing   Rheumatoid arthritis (HCC)   Essential hypertension   Elevated troponin   Confusion   Generalized weakness   Protein-calorie malnutrition, severe   AMS -Patient presented with encephalopathy as evidenced by his reported confusion and generalized weakness -Exam is negative except for [facility] - With recent hallucinationsthis may be dementia related; along w/ severe HOH and visual loss/ blindness very difficult  to communicate -There is no evidence of infection at this time - old notes show suggest possible underlying dementia -Telemetry showing sinus bradycardia initially now improving, BP's here are normal. -ER physician raised concernfor MTX toxicity, drug level sent off result pend -Social worker spoke with family patient's son reports that he has been in skilled nursing facility 3 times this year. - PT consult recommend skilled facility - Cont meds except MTX for now; await methotrexate level  RA -He takes25 mgmethotrexate on Fridays; checking level -Continue daily prednisone; there does not appear to be an indication for stress-dosed steroids at this time  Hx CABG~1999/ CM EF 30%/ sp TAVR 2014 - has known occlusion of the SVG graft 100%. Poor candidate for PCI per cardiology notes from 07/2017.   Hypothyroidism -Normal TSHin 5/19 -Continue Synthroid at current dose for now  HTN -Continue Norvasc and Coreg  Elevated troponin/BNP -No current evidence of acute CHF, appears euvolemic and asymptomatic -Denies chest pain -Will trend troponin but low suspicion for ACS -BMP appears to be lower than usual baseline -Restart Lasix  Stage 3 CKD -Appears to be stable  Blind - hx of retinal art embolism L side  HOH - not sure how much of this is contributing to picture of AMS along w/ dementia and blindness   DVT prophylaxis:Lovenox  Code Status:Full Family Communication:No one present, social worker spoken to family regarding placement Disposition Plan:Likely skilled facility for rehab then home.  Patient to convert to inpatient status today Consults called:None  Subjective: No new complaints.  Feeling somewhat better.  Objective: Vitals:   03/24/18 2012 03/25/18 0220 03/25/18 0457 03/25/18 1320  BP: (!) 121/55  (!) 119/59 122/60  Pulse: (!) 59  61 62  Resp: 18  18 19   Temp: 98.9 F (37.2 C)  98.5 F (36.9 C) 98.4 F (36.9 C)  TempSrc: Oral  Oral  Oral  SpO2: 98%  99% 100%  Weight:  67.9 kg    Height:        Intake/Output Summary (Last 24 hours) at 03/25/2018 1425 Last data filed at 03/25/2018 1328 Gross per 24 hour  Intake 1300 ml  Output 2300 ml  Net -1000 ml   Filed Weights   03/23/18 0445 03/24/18 0403 03/25/18 0220  Weight: 44.9 kg 66.5 kg 67.9 kg    Examination:  General exam: Appears calm and comfortable  Respiratory system: Clear to auscultation. Respiratory effort normal. Cardiovascular system: S1 & S2 heard, RRR. No JVD, murmurs, rubs, gallops or clicks. No pedal edema. Gastrointestinal system: Abdomen is nondistended, soft and nontender. No organomegaly or masses felt. Normal bowel sounds heard. Central nervous system: Alert and oriented. No focal neurological deficits. Extremities: Symmetric 5 x 5 power. Skin: No rashes, lesions or ulcers Psychiatry: Judgement and insight appear poor.  Mood & affect appropriate.     Data Reviewed: I have personally reviewed following labs and imaging studies  CBC: Recent Labs  Lab 03/22/18 0359 03/23/18 1151  WBC 9.8 7.9  HGB 10.5* 9.4*  HCT 35.3* 30.6*  MCV 90.3 89.2  PLT 164 148*   Basic Metabolic Panel: Recent Labs  Lab 03/22/18 0359 03/23/18 1151  NA 135 134*  K 5.0 4.3  CL 99 100  CO2 25 26  GLUCOSE 117* 95  BUN 32* 31*  CREATININE 1.59* 1.37*  CALCIUM 8.7* 8.5*   GFR: Estimated Creatinine Clearance: 34.8 mL/min (A) (by C-G formula based on SCr of 1.37 mg/dL (H)). Liver Function Tests: Recent Labs  Lab 03/22/18 0359  AST 19  ALT 10  ALKPHOS 69  BILITOT 0.8  PROT 7.0  ALBUMIN 3.7   No results for input(s): LIPASE, AMYLASE in the last 168 hours. No results for input(s): AMMONIA in the last 168 hours. Coagulation Profile: No results for input(s): INR, PROTIME in the last 168 hours. Cardiac Enzymes: Recent Labs  Lab 03/22/18 0359 03/22/18 0923 03/22/18 1508 03/22/18 1850  TROPONINI 0.04* 0.04* 0.03* 0.03*   BNP (last 3  results) No results for input(s): PROBNP in the last 8760 hours. HbA1C: No results for input(s): HGBA1C in the last 72 hours. CBG: Recent Labs  Lab 03/22/18 0343 03/23/18 0740  GLUCAP 113* 81   Lipid Profile: No results for input(s): CHOL, HDL, LDLCALC, TRIG, CHOLHDL, LDLDIRECT in the last 72 hours. Thyroid Function Tests: No results for input(s): TSH, T4TOTAL, FREET4, T3FREE, THYROIDAB in the last 72 hours. Anemia Panel: No results for input(s): VITAMINB12, FOLATE, FERRITIN, TIBC, IRON, RETICCTPCT in the last 72 hours. Sepsis Labs: No results for input(s): PROCALCITON, LATICACIDVEN in the last 168 hours.  No results found for this or any previous visit (from the past 240 hour(s)).       Radiology Studies: No results found.      Scheduled Meds: . acetaminophen  1,000 mg Oral BID  . amLODipine  5 mg Oral Daily  . aspirin EC  81 mg Oral Daily  . atorvastatin  20 mg Oral Daily  . carvedilol  6.25 mg Oral BID WC  . clopidogrel  75 mg Oral Daily  . docusate sodium  100 mg Oral BID  . enoxaparin (LOVENOX) injection  30 mg Subcutaneous Q24H  . feeding supplement (ENSURE ENLIVE)  237 mL Oral TID  BM  . feeding supplement (PRO-STAT SUGAR FREE 64)  30 mL Oral BID  . folic acid  3 mg Oral Daily  . levothyroxine  50 mcg Oral Q0600  . loratadine  10 mg Oral Daily  . multivitamin with minerals  1 tablet Oral Daily  . pantoprazole  40 mg Oral Daily  . predniSONE  5 mg Oral Q breakfast  . sodium chloride flush  3 mL Intravenous Q12H   Continuous Infusions: . sodium chloride 65 mL/hr at 03/24/18 0445     LOS: 0 days    Time spent: 45 minutes    [name], MD FACP [facility] Pager [phone]  If 7PM-7AM, please contact night-coverage [email or web address] Password [email or web address] 03/25/2018, 2:25 PM

## 2018-03-26 DIAGNOSIS — E079 Disorder of thyroid, unspecified: Secondary | ICD-10-CM | POA: Diagnosis not present

## 2018-03-26 DIAGNOSIS — G9341 Metabolic encephalopathy: Secondary | ICD-10-CM | POA: Diagnosis not present

## 2018-03-26 DIAGNOSIS — D519 Vitamin B12 deficiency anemia, unspecified: Secondary | ICD-10-CM

## 2018-03-26 DIAGNOSIS — R410 Disorientation, unspecified: Secondary | ICD-10-CM | POA: Diagnosis not present

## 2018-03-26 DIAGNOSIS — N183 Chronic kidney disease, stage 3 (moderate): Secondary | ICD-10-CM | POA: Diagnosis not present

## 2018-03-26 LAB — VITAMIN B12: Vitamin B-12: 155 pg/mL — ABNORMAL LOW (ref 180–914)

## 2018-03-26 LAB — METHOTREXATE

## 2018-03-26 LAB — RPR: RPR Ser Ql: NONREACTIVE

## 2018-03-26 LAB — TSH: TSH: 18.669 u[IU]/mL — ABNORMAL HIGH (ref 0.350–4.500)

## 2018-03-26 MED ORDER — CYANOCOBALAMIN 1000 MCG/ML IJ SOLN
1000.0000 ug | Freq: Once | INTRAMUSCULAR | Status: AC
  Administered 2018-03-26: 1000 ug via SUBCUTANEOUS
  Filled 2018-03-26: qty 1

## 2018-03-26 MED ORDER — LEVOTHYROXINE SODIUM 88 MCG PO TABS
88.0000 ug | ORAL_TABLET | Freq: Every day | ORAL | Status: DC
  Administered 2018-03-27: 88 ug via ORAL
  Filled 2018-03-26: qty 1

## 2018-03-26 NOTE — Progress Notes (Signed)
PROGRESS NOTE                                                                                                                                                                                                             Patient Demographics:    Hunter Choi, is a 84 y.o. male, DOB - 1927-09-11, JQZ:[id number]  Admit date - 03/22/2018   Admitting Physician Lorretta Harp, MD  Outpatient Primary MD for the patient is [name], MD  LOS - 1  Outpatient Specialists: NONE  Chief Complaint  Patient presents with  . Altered Mental Status  . Weakness       Brief Narrative   84 year old male with history of hypertension, stroke, GERD, AAA, BPH, hypothyroidism, CAD, CHF with EF of 30%, PAF, status post TAVR and rheumatoid arthritis on methotrexate presented with acute change in mental status.  Initially work-up showed no signs of infection.  Patient on high-dose of methotrexate for rheumatoid arthritis and there was concern for possible methotrexate toxicity.  There is also concern for recent hallucinations. Patient seen by PT who recommends SNF.   Subjective:   Patient appears more oriented today (he is oriented to place and person, confused with date).  No overnight events.   Assessment  & Plan :    Principal Problem:   Acute metabolic encephalopathy Has markedly elevated TSH (18.66) and low B12 of 155.  RPR and HIV antibody pending. Increased Synthroid dose to 88 mcg daily.  Will replenish B12.  No signs of infection. Methotrexate level pending.  Active Problems:   Rheumatoid arthritis (HCC) Methotrexate held with concern for toxicity and levels pending.  Continue daily prednisone.  CAD with history of CABG, cardiomyopathy with EF of 30% Known occlusion of SVG graft, consider poor candidate for PCI.  Continue home medications including Coreg  Hypothyroidism Markedly elevated TSH.  Possibly contributing to his  encephalopathy..  Increase Synthroid dose to 88 mcg.  B12 deficiency (155) Ordered subcu vitamin B12 injection.  Will benefit from monthly B12 injection.    Essential hypertension Stable continue home meds    Elevated troponin No chest pain symptoms or signs of ischemia.    CKD (chronic kidney disease) stage 3, GFR 30-59 ml/min (HCC) Renal function stable at baseline.     Generalized weakness   Protein-calorie malnutrition, severe PT recommends SNF.  Dietitian  started on Ensure and prostat.      Code Status : Full code  Family Communication  : None at bedside.  Son involved in care  Disposition Plan  : SNF possibly tomorrow  Barriers For Discharge : Active symptoms  Consults  : None  Procedures  : CT head  DVT Prophylaxis  :  Lovenox -  Lab Results  Component Value Date   PLT 148 (L) 03/23/2018    Antibiotics  Anti-infectives (From admission, onward)   None        Objective:   Vitals:   03/25/18 1320 03/25/18 1951 03/26/18 0536 03/26/18 1207  BP: 122/60 (!) 113/55 (!) 117/55 (!) 91/45  Pulse: 62 (!) 59 (!) 56 (!) 58  Resp: 19 18 18 18   Temp: 98.4 F (36.9 C) 98.5 F (36.9 C) 98.2 F (36.8 C) 98.6 F (37 C)  TempSrc: Oral Oral Oral Oral  SpO2: 100% 98% 97% 100%  Weight:   66.5 kg   Height:        Wt Readings from Last 3 Encounters:  03/26/18 66.5 kg  03/12/18 65 kg  12/14/17 66.3 kg     Intake/Output Summary (Last 24 hours) at 03/26/2018 1316 Last data filed at 03/26/2018 0941 Gross per 24 hour  Intake 1110 ml  Output 3601 ml  Net -2491 ml     Physical Exam  Gen: not in distress HEENT: Pallor present, moist mucosa, supple neck Chest: clear b/l, no added sounds CVS: N S1&S2, no murmurs, rubs or gallop GI: soft, NT, ND, BS+ Musculoskeletal: warm, no edema CNS: AAOX2-3, nonfocal    Data Review:    CBC Recent Labs  Lab 03/22/18 0359 03/23/18 1151  WBC 9.8 7.9  HGB 10.5* 9.4*  HCT 35.3* 30.6*  PLT 164 148*  MCV 90.3  89.2  MCH 26.9 27.4  MCHC 29.7* 30.7  RDW 19.2* 19.0*    Chemistries  Recent Labs  Lab 03/22/18 0359 03/23/18 1151  NA 135 134*  K 5.0 4.3  CL 99 100  CO2 25 26  GLUCOSE 117* 95  BUN 32* 31*  CREATININE 1.59* 1.37*  CALCIUM 8.7* 8.5*  AST 19  --   ALT 10  --   ALKPHOS 69  --   BILITOT 0.8  --    ------------------------------------------------------------------------------------------------------------------ No results for input(s): CHOL, HDL, LDLCALC, TRIG, CHOLHDL, LDLDIRECT in the last 72 hours.  Lab Results  Component Value Date   HGBA1C 6.0 (H) 05/01/2015   ------------------------------------------------------------------------------------------------------------------ Recent Labs    03/26/18 0751  TSH 18.669*   ------------------------------------------------------------------------------------------------------------------ Recent Labs    03/26/18 0751  VITAMINB12 155*    Coagulation profile No results for input(s): INR, PROTIME in the last 168 hours.  No results for input(s): DDIMER in the last 72 hours.  Cardiac Enzymes Recent Labs  Lab 03/22/18 0923 03/22/18 1508 03/22/18 1850  TROPONINI 0.04* 0.03* 0.03*   ------------------------------------------------------------------------------------------------------------------    Component Value Date/Time   BNP 1,508.9 (H) 03/22/2018 0359   BNP 340.6 (H) 06/19/2015 1023    Inpatient Medications  Scheduled Meds: . acetaminophen  1,000 mg Oral BID  . amLODipine  5 mg Oral Daily  . aspirin EC  81 mg Oral Daily  . atorvastatin  20 mg Oral Daily  . carvedilol  6.25 mg Oral BID WC  . clopidogrel  75 mg Oral Daily  . docusate sodium  100 mg Oral BID  . enoxaparin (LOVENOX) injection  30 mg Subcutaneous Q24H  . feeding supplement (ENSURE ENLIVE)  237 mL Oral TID BM  . feeding supplement (PRO-STAT SUGAR FREE 64)  30 mL Oral BID  . folic acid  3 mg Oral Daily  . furosemide  40 mg Oral Daily  .  levothyroxine  50 mcg Oral Q0600  . loratadine  10 mg Oral Daily  . multivitamin with minerals  1 tablet Oral Daily  . pantoprazole  40 mg Oral Daily  . predniSONE  5 mg Oral Q breakfast  . sodium chloride flush  3 mL Intravenous Q12H   Continuous Infusions: . sodium chloride 65 mL/hr at 03/24/18 0445   PRN Meds:.acetaminophen **OR** acetaminophen, ondansetron **OR** ondansetron (ZOFRAN) IV  Micro Results No results found for this or any previous visit (from the past 240 hour(s)).  Radiology Reports No results found.  Time Spent in minutes  25   Toivo Bordon M.D on 03/26/2018 at 1:16 PM  Between 7am to 7pm - Pager - [phone]  After 7pm go to [email or web address] - password [facility]  [facility] -  Office  [phone]

## 2018-03-26 NOTE — Clinical Social Work Note (Addendum)
Patient oriented x 3 today per staff but very hard of hearing. Discussed SNF recommendation and son's first preference for [facility]. Patient agreeable to this. Provided Medicare.gov scores but he said he could not see them so left them on table for son. Left son voicemail. Will notify him of bed offer, Medicare scores, and start insurance authorization once approved.  Charlynn Court, CSW [phone]  12:32 pm Called and spoke with patient's son. Notified him that [facility] is able to offer a bed and has started insurance authorization. Will likely have it tomorrow. Patient's son requested [facility] transport him to the facility. Notified son that Medicare.gov scores are in patient's room for him to review.  Charlynn Court, CSW [phone]  12:37 pm Patient's son has decided he will transport patient to facility when discharged to avoid increased confusion. Will call him once insurance authorization obtained so a time can be arranged.  Charlynn Court, CSW [phone]

## 2018-03-27 DIAGNOSIS — E538 Deficiency of other specified B group vitamins: Secondary | ICD-10-CM | POA: Diagnosis not present

## 2018-03-27 DIAGNOSIS — N183 Chronic kidney disease, stage 3 (moderate): Secondary | ICD-10-CM | POA: Diagnosis not present

## 2018-03-27 DIAGNOSIS — I5042 Chronic combined systolic (congestive) and diastolic (congestive) heart failure: Secondary | ICD-10-CM | POA: Diagnosis present

## 2018-03-27 DIAGNOSIS — R410 Disorientation, unspecified: Secondary | ICD-10-CM | POA: Diagnosis not present

## 2018-03-27 DIAGNOSIS — E039 Hypothyroidism, unspecified: Secondary | ICD-10-CM | POA: Diagnosis present

## 2018-03-27 DIAGNOSIS — G9341 Metabolic encephalopathy: Secondary | ICD-10-CM | POA: Diagnosis not present

## 2018-03-27 DIAGNOSIS — E43 Unspecified severe protein-calorie malnutrition: Secondary | ICD-10-CM

## 2018-03-27 MED ORDER — VITAMIN B-12 1000 MCG PO TABS: 1000.0000 ug | ORAL_TABLET | Freq: Every day | ORAL | 0 refills | Status: DC

## 2018-03-27 MED ORDER — PRO-STAT SUGAR FREE PO LIQD: 30.0000 mL | Freq: Two times a day (BID) | ORAL | 0 refills | Status: DC

## 2018-03-27 MED ORDER — ADULT MULTIVITAMIN W/MINERALS CH: 1.0000 | ORAL_TABLET | Freq: Every day | ORAL | 0 refills | Status: DC

## 2018-03-27 MED ORDER — ENSURE ENLIVE PO LIQD: 237.0000 mL | Freq: Three times a day (TID) | ORAL | 12 refills | Status: DC

## 2018-03-27 MED ORDER — LEVOTHYROXINE SODIUM 88 MCG PO TABS: 88.0000 ug | ORAL_TABLET | Freq: Every day | ORAL | 0 refills | Status: DC

## 2018-03-27 NOTE — Discharge Summary (Signed)
Physician Discharge Summary  Hunter Choi XIP:[id number] DOB: May 29, 1927 DOA: 03/22/2018  PCP: [name], MD  Admit date: 03/22/2018 Discharge date: 03/27/2018  Admitted From: Home Disposition: Skilled nursing facility  Recommendations for Outpatient Follow-up:  1. Follow up with MD at SNF in 1 week.  Home Health: None Equipment/Devices: Per therapy at the facility   Discharge Condition: Fair CODE STATUS: Full code Diet recommendation: Heart Healthy    Discharge Diagnoses:  Principal Problem:   Acute metabolic encephalopathy  Active Problems:   Rheumatoid arthritis (HCC)   Thyroid disease   Coronary artery disease involving coronary bypass graft of native heart with angina pectoris (HCC)   S/P TAVR (transcatheter aortic valve replacement)   Hard of hearing   Essential hypertension   Elevated troponin   CKD (chronic kidney disease) stage 3, GFR 30-59 ml/min (HCC)   Generalized weakness   Protein-calorie malnutrition, severe   B12 deficiency   Hypothyroidism   Chronic combined systolic and diastolic CHF (congestive heart failure) (HCC)  Brief narrative/HPI 84 year old male with history of hypertension, stroke, GERD, AAA, BPH, hypothyroidism, CAD, CHF with EF of 30%, PAF, status post TAVR and rheumatoid arthritis on methotrexate presented with acute change in mental status.  Initially work-up showed no signs of infection.  Patient on high-dose of methotrexate for rheumatoid arthritis and there was concern for possible methotrexate toxicity.  There is also concern for recent hallucinations. Patient seen by PT who recommends SNF.  Hospital course  Principal Problem:   Acute metabolic encephalopathy Has markedly elevated TSH (18.66) and low B12 of 155.    Likely contributing to symptoms.  RPR and HIV antibody negative. Increased Synthroid dose to 88 mcg daily.  Will replenish B12.  No signs of infection. Methotrexate level of 0.05 which is actually in the  subtherapeutic range. symptoms are unlikely due to methotrexate toxicity. Mental status has started improving to baseline.  Patient is very hard of hearing but is oriented to place and person.  He possibly has underlying mild dementia.  Active Problems:   Rheumatoid arthritis (HCC) Methotrexate held with concern for toxicity.  Levels reassuring and resumed (weekly methotrexate)..  Continue daily prednisone.  CAD with history of CABG, cardiomyopathy with EF of 30% Known occlusion of SVG graft, consider poor candidate for PCI.  Continue home medications including aspirin, statin and Coreg.  Euvolemic.  Continue Lasix.  Not on ACE inhibitor/ARB due to chronic kidney disease.  Hypothyroidism Markedly elevated TSH.  Possibly contributing to his encephalopathy..  Increase Synthroid dose to 88 mcg.  B12 deficiency (155) Ordered subcu vitamin B12 injection.    Started him on oral B12 daily..    Essential hypertension Stable continue home meds    Elevated troponin No chest pain symptoms or signs of ischemia.  Stable on telemetry.     CKD (chronic kidney disease) stage 3, GFR 30-59 ml/min (HCC) Renal function stable at baseline.  (1.4-1.5)     Generalized weakness   Protein-calorie malnutrition, severe PT recommends SNF.  Dietitian started on Ensure and prostat.  Consults: None Procedures: Head CT Disposition: SNF  Discharge Instructions   Allergies as of 03/27/2018      Reactions   Ciprofloxacin Other (See Comments)   REACTION: mild delirium      Medication List    STOP taking these medications   loratadine 10 MG tablet Commonly known as:  CLARITIN     TAKE these medications   acetaminophen 500 MG tablet Commonly known as:  TYLENOL Take 1,000 mg  by mouth 2 (two) times daily.   amLODipine 5 MG tablet Commonly known as:  NORVASC Take 5 mg by mouth daily.   aspirin 81 MG EC tablet Take 1 tablet (81 mg total) by mouth daily.   atorvastatin 20 MG  tablet Commonly known as:  LIPITOR Take 20 mg by mouth daily.   carvedilol 6.25 MG tablet Commonly known as:  COREG Take 1 tablet (6.25 mg total) by mouth 2 (two) times daily with a meal.   clopidogrel 75 MG tablet Commonly known as:  PLAVIX Take 1 tablet (75 mg total) by mouth daily.   feeding supplement (ENSURE ENLIVE) Liqd Take 237 mLs by mouth 3 (three) times daily between meals. What changed:  when to take this   feeding supplement (PRO-STAT SUGAR FREE 64) Liqd Take 30 mLs by mouth 2 (two) times daily.   folic acid 1 MG tablet Commonly known as:  FOLVITE Take 3 mg by mouth daily.   furosemide 20 MG tablet Commonly known as:  LASIX TAKE 2 TABLETS BY MOUTH ONE TIME A DAY What changed:  See the new instructions.   levothyroxine 88 MCG tablet Commonly known as:  SYNTHROID, LEVOTHROID Take 1 tablet (88 mcg total) by mouth daily. What changed:    medication strength  how much to take   methotrexate 2.5 MG tablet Take 25 mg by mouth every Friday.   multivitamin with minerals Tabs tablet Take 1 tablet by mouth daily.   nitroGLYCERIN 0.4 MG SL tablet Commonly known as:  NITROSTAT PLACE 1 TABLET (0.4 MG TOTAL) UNDER THE TONGUE EVERY 5 MINUTES AS NEEDED FOR CHEST PAIN What changed:  See the new instructions.   pantoprazole 40 MG tablet Commonly known as:  PROTONIX Take 1 tablet (40 mg total) by mouth daily.   predniSONE 5 MG tablet Commonly known as:  DELTASONE Take 5 mg by mouth daily.   vitamin B-12 1000 MCG tablet Commonly known as:  CYANOCOBALAMIN Take 1 tablet (1,000 mcg total) by mouth daily.      Contact information for after-discharge care    Destination    [facility] Preferred SNF .   Service:  Skilled Nursing Contact information: [address] [place] [address] [phone]             Allergies  Allergen Reactions  . Ciprofloxacin Other (See Comments)    REACTION: mild delirium           Subjective: No overnight events.  Continues to have better mentation.  Denies any symptoms.  Discharge Exam: Vitals:   03/26/18 2015 03/27/18 0401  BP: (!) 109/53 (!) 113/50  Pulse: (!) 57 (!) 58  Resp: 18 18  Temp: 98.8 F (37.1 C) 98.6 F (37 C)  SpO2: 98% 95%   Vitals:   03/26/18 0536 03/26/18 1207 03/26/18 2015 03/27/18 0401  BP: (!) 117/55 (!) 91/45 (!) 109/53 (!) 113/50  Pulse: (!) 56 (!) 58 (!) 57 (!) 58  Resp: 18 18 18 18   Temp: 98.2 F (36.8 C) 98.6 F (37 C) 98.8 F (37.1 C) 98.6 F (37 C)  TempSrc: Oral Oral Oral Oral  SpO2: 97% 100% 98% 95%  Weight: 66.5 kg   65.9 kg  Height:        General: Not in distress HEENT: Moist mucosa, supple neck Chest: Clear bilaterally CVs: Normal S1-S2, no murmurs GI: Soft, nondistended, nontender Musculoskeletal: Warm, no edema CNs: Alert and oriented x 2-3, hard of hearing     The  results of significant diagnostics from this hospitalization (including imaging, microbiology, ancillary and laboratory) are listed below for reference.     Microbiology: No results found for this or any previous visit (from the past 240 hour(s)).   Labs: BNP (last 3 results) Recent Labs    08/03/17 2010 12/09/17 1300 03/22/18 0359  BNP 2,896.1* 2,836.3* 1,508.9*   Basic Metabolic Panel: Recent Labs  Lab 03/22/18 0359 03/23/18 1151  NA 135 134*  K 5.0 4.3  CL 99 100  CO2 25 26  GLUCOSE 117* 95  BUN 32* 31*  CREATININE 1.59* 1.37*  CALCIUM 8.7* 8.5*   Liver Function Tests: Recent Labs  Lab 03/22/18 0359  AST 19  ALT 10  ALKPHOS 69  BILITOT 0.8  PROT 7.0  ALBUMIN 3.7   No results for input(s): LIPASE, AMYLASE in the last 168 hours. No results for input(s): AMMONIA in the last 168 hours. CBC: Recent Labs  Lab 03/22/18 0359 03/23/18 1151  WBC 9.8 7.9  HGB 10.5* 9.4*  HCT 35.3* 30.6*  MCV 90.3 89.2  PLT 164 148*   Cardiac Enzymes: Recent Labs  Lab 03/22/18 0359 03/22/18 0923  03/22/18 1508 03/22/18 1850  TROPONINI 0.04* 0.04* 0.03* 0.03*   BNP: Invalid input(s): POCBNP CBG: Recent Labs  Lab 03/22/18 0343 03/23/18 0740  GLUCAP 113* 81   D-Dimer No results for input(s): DDIMER in the last 72 hours. Hgb A1c No results for input(s): HGBA1C in the last 72 hours. Lipid Profile No results for input(s): CHOL, HDL, LDLCALC, TRIG, CHOLHDL, LDLDIRECT in the last 72 hours. Thyroid function studies Recent Labs    03/26/18 0751  TSH 18.669*   Anemia work up Recent Labs    03/26/18 0751  VITAMINB12 155*   Urinalysis    Component Value Date/Time   COLORURINE STRAW (A) 03/22/2018 0511   APPEARANCEUR CLEAR 03/22/2018 0511   LABSPEC 1.011 03/22/2018 0511   PHURINE 6.0 03/22/2018 0511   GLUCOSEU NEGATIVE 03/22/2018 0511   HGBUR SMALL (A) 03/22/2018 0511   BILIRUBINUR NEGATIVE 03/22/2018 0511   KETONESUR NEGATIVE 03/22/2018 0511   PROTEINUR NEGATIVE 03/22/2018 0511   UROBILINOGEN 4.0 (H) 11/14/2014 1849   NITRITE NEGATIVE 03/22/2018 0511   LEUKOCYTESUR NEGATIVE 03/22/2018 0511   Sepsis Labs Invalid input(s): PROCALCITONIN,  WBC,  LACTICIDVEN Microbiology No results found for this or any previous visit (from the past 240 hour(s)).   Time coordinating discharge: 35 minutes  SIGNED:   Eddie North, MD  [facility] 03/27/2018, 9:45 AM Pager   If 7PM-7AM, please contact night-coverage [email or web address] Password [email or web address]

## 2018-03-27 NOTE — Social Work (Addendum)
Patient has [facility] approval to go to SNF today.  Patient will discharge to [facility]  Anticipated discharge date: 03/27/18 Family notified: Damichael Hofman, son Transportation by: son will drive patient - he will pick patient up at 4:30 pm  Nurse to call report to [phone].  CSW signing off.  Abigail Butts, LCSWA  Clinical Social Worker

## 2018-03-27 NOTE — Clinical Social Work Note (Addendum)
Insurance authorization still pending.  Charlynn Court, CSW [phone]  1:19 pm Still pending.  Charlynn Court, CSW [phone]

## 2018-03-27 NOTE — Progress Notes (Signed)
Report given to Sheralyn Boatman, Charity fundraiser at [facility]. Son at beside ready to transport patient to facility.

## 2018-03-27 NOTE — Clinical Social Work Placement (Signed)
   CLINICAL SOCIAL WORK PLACEMENT  NOTE  Date:  03/27/2018  Patient Details  Name: Hunter Choi MRN: [id number] Date of Birth: 02/06/1928  Clinical Social Work is seeking post-discharge placement for this patient at the Skilled  Nursing Facility level of care (*CSW will initial, date and re-position this form in  chart as items are completed):  Yes   Patient/family provided with [facility] Clinical Social Work Department's list of facilities offering this level of care within the geographic area requested by the patient (or if unable, by the patient's family).  Yes   Patient/family informed of their freedom to choose among providers that offer the needed level of care, that participate in Medicare, Medicaid or managed care program needed by the patient, have an available bed and are willing to accept the patient.  Yes   Patient/family informed of [facility]'s ownership interest in [facility] and [facility], as well as of the fact that they are under no obligation to receive care at these facilities.  PASRR submitted to EDS on 03/24/18     PASRR number received on       Existing PASRR number confirmed on 03/24/18     FL2 transmitted to all facilities in geographic area requested by pt/family on 03/24/18     FL2 transmitted to all facilities within larger geographic area on       Patient informed that his/her managed care company has contracts with or will negotiate with certain facilities, including the following:  [facility]     Yes   Patient/family informed of bed offers received.  Patient chooses bed at [facility]     Physician recommends and patient chooses bed at      Patient to be transferred to [facility] on 03/27/18.  Patient to be transferred to facility by son will drive     Patient family notified on 03/27/18 of transfer.  Name of family member notified:  Hunter Choi, son     PHYSICIAN       Additional Comment:     _______________________________________________ Abigail Butts, LCSW 03/27/2018, 2:55 PM

## 2018-03-27 NOTE — Plan of Care (Signed)
  Problem: Activity: Goal: Capacity to carry out activities will improve Outcome: Progressing   Problem: Cardiac: Goal: Ability to achieve and maintain adequate cardiopulmonary perfusion will improve Outcome: Progressing   

## 2018-04-15 ENCOUNTER — Other Ambulatory Visit: Payer: Self-pay | Admitting: Cardiology

## 2018-04-22 ENCOUNTER — Inpatient Hospital Stay (HOSPITAL_COMMUNITY)
Admission: EM | Admit: 2018-04-22 | Discharge: 2018-04-30 | DRG: 392 | Disposition: A | Discharge status: Home Health | Payer: Medicare Other | Attending: Internal Medicine | Admitting: Internal Medicine

## 2018-04-22 ENCOUNTER — Encounter (HOSPITAL_COMMUNITY): Payer: Self-pay | Admitting: Internal Medicine

## 2018-04-22 DIAGNOSIS — R112 Nausea with vomiting, unspecified: Secondary | ICD-10-CM | POA: Diagnosis present

## 2018-04-22 DIAGNOSIS — Z953 Presence of xenogenic heart valve: Secondary | ICD-10-CM

## 2018-04-22 DIAGNOSIS — K219 Gastro-esophageal reflux disease without esophagitis: Secondary | ICD-10-CM | POA: Diagnosis present

## 2018-04-22 DIAGNOSIS — E039 Hypothyroidism, unspecified: Secondary | ICD-10-CM | POA: Diagnosis present

## 2018-04-22 DIAGNOSIS — I723 Aneurysm of iliac artery: Secondary | ICD-10-CM | POA: Diagnosis present

## 2018-04-22 DIAGNOSIS — I480 Paroxysmal atrial fibrillation: Secondary | ICD-10-CM | POA: Diagnosis present

## 2018-04-22 DIAGNOSIS — N400 Enlarged prostate without lower urinary tract symptoms: Secondary | ICD-10-CM | POA: Diagnosis present

## 2018-04-22 DIAGNOSIS — Z952 Presence of prosthetic heart valve: Secondary | ICD-10-CM

## 2018-04-22 DIAGNOSIS — Z8673 Personal history of transient ischemic attack (TIA), and cerebral infarction without residual deficits: Secondary | ICD-10-CM

## 2018-04-22 DIAGNOSIS — I5042 Chronic combined systolic (congestive) and diastolic (congestive) heart failure: Secondary | ICD-10-CM | POA: Diagnosis present

## 2018-04-22 DIAGNOSIS — D696 Thrombocytopenia, unspecified: Secondary | ICD-10-CM | POA: Diagnosis present

## 2018-04-22 DIAGNOSIS — Z7952 Long term (current) use of systemic steroids: Secondary | ICD-10-CM

## 2018-04-22 DIAGNOSIS — E079 Disorder of thyroid, unspecified: Secondary | ICD-10-CM | POA: Diagnosis present

## 2018-04-22 DIAGNOSIS — H5462 Unqualified visual loss, left eye, normal vision right eye: Secondary | ICD-10-CM | POA: Diagnosis present

## 2018-04-22 DIAGNOSIS — R197 Diarrhea, unspecified: Secondary | ICD-10-CM

## 2018-04-22 DIAGNOSIS — E860 Dehydration: Secondary | ICD-10-CM | POA: Diagnosis present

## 2018-04-22 DIAGNOSIS — A0811 Acute gastroenteropathy due to Norwalk agent: Secondary | ICD-10-CM | POA: Diagnosis not present

## 2018-04-22 DIAGNOSIS — I10 Essential (primary) hypertension: Secondary | ICD-10-CM | POA: Diagnosis present

## 2018-04-22 DIAGNOSIS — I714 Abdominal aortic aneurysm, without rupture: Secondary | ICD-10-CM | POA: Diagnosis present

## 2018-04-22 DIAGNOSIS — Z7982 Long term (current) use of aspirin: Secondary | ICD-10-CM

## 2018-04-22 DIAGNOSIS — N3001 Acute cystitis with hematuria: Secondary | ICD-10-CM

## 2018-04-22 DIAGNOSIS — Z79899 Other long term (current) drug therapy: Secondary | ICD-10-CM

## 2018-04-22 DIAGNOSIS — Z79890 Hormone replacement therapy (postmenopausal): Secondary | ICD-10-CM

## 2018-04-22 DIAGNOSIS — I110 Hypertensive heart disease with heart failure: Secondary | ICD-10-CM | POA: Diagnosis present

## 2018-04-22 DIAGNOSIS — R059 Cough, unspecified: Secondary | ICD-10-CM

## 2018-04-22 DIAGNOSIS — Z7902 Long term (current) use of antithrombotics/antiplatelets: Secondary | ICD-10-CM

## 2018-04-22 DIAGNOSIS — Z87891 Personal history of nicotine dependence: Secondary | ICD-10-CM

## 2018-04-22 DIAGNOSIS — Z881 Allergy status to other antibiotic agents status: Secondary | ICD-10-CM

## 2018-04-22 DIAGNOSIS — Z8249 Family history of ischemic heart disease and other diseases of the circulatory system: Secondary | ICD-10-CM

## 2018-04-22 DIAGNOSIS — R0602 Shortness of breath: Secondary | ICD-10-CM

## 2018-04-22 DIAGNOSIS — R1110 Vomiting, unspecified: Secondary | ICD-10-CM

## 2018-04-22 DIAGNOSIS — M069 Rheumatoid arthritis, unspecified: Secondary | ICD-10-CM | POA: Diagnosis present

## 2018-04-22 DIAGNOSIS — N179 Acute kidney failure, unspecified: Secondary | ICD-10-CM | POA: Diagnosis present

## 2018-04-22 DIAGNOSIS — I25709 Atherosclerosis of coronary artery bypass graft(s), unspecified, with unspecified angina pectoris: Secondary | ICD-10-CM | POA: Diagnosis present

## 2018-04-22 DIAGNOSIS — H9190 Unspecified hearing loss, unspecified ear: Secondary | ICD-10-CM | POA: Diagnosis present

## 2018-04-22 DIAGNOSIS — R05 Cough: Secondary | ICD-10-CM

## 2018-04-22 LAB — CBC WITH DIFFERENTIAL/PLATELET
Abs Immature Granulocytes: 0.07 10*3/uL (ref 0.00–0.07)
Basophils Absolute: 0 10*3/uL (ref 0.0–0.1)
Basophils Relative: 0 %
Eosinophils Absolute: 0.1 10*3/uL (ref 0.0–0.5)
Eosinophils Relative: 2 %
HEMATOCRIT: 31.8 % — AB (ref 39.0–52.0)
HEMOGLOBIN: 9.4 g/dL — AB (ref 13.0–17.0)
Immature Granulocytes: 1 %
Lymphocytes Relative: 20 %
Lymphs Abs: 1.1 10*3/uL (ref 0.7–4.0)
MCH: 26.6 pg (ref 26.0–34.0)
MCHC: 29.6 g/dL — ABNORMAL LOW (ref 30.0–36.0)
MCV: 90.1 fL (ref 80.0–100.0)
MONO ABS: 0.2 10*3/uL (ref 0.1–1.0)
Monocytes Relative: 4 %
Neutro Abs: 3.8 10*3/uL (ref 1.7–7.7)
Neutrophils Relative %: 73 %
Platelets: 152 10*3/uL (ref 150–400)
RBC: 3.53 MIL/uL — ABNORMAL LOW (ref 4.22–5.81)
RDW: 17.8 % — ABNORMAL HIGH (ref 11.5–15.5)
WBC: 5.2 10*3/uL (ref 4.0–10.5)
nRBC: 0.4 % — ABNORMAL HIGH (ref 0.0–0.2)

## 2018-04-22 LAB — COMPREHENSIVE METABOLIC PANEL
ALT: 10 U/L (ref 0–44)
AST: 20 U/L (ref 15–41)
Albumin: 3.5 g/dL (ref 3.5–5.0)
Alkaline Phosphatase: 52 U/L (ref 38–126)
Anion gap: 11 (ref 5–15)
BUN: 36 mg/dL — ABNORMAL HIGH (ref 8–23)
CO2: 23 mmol/L (ref 22–32)
Calcium: 8.3 mg/dL — ABNORMAL LOW (ref 8.9–10.3)
Chloride: 102 mmol/L (ref 98–111)
Creatinine, Ser: 1.51 mg/dL — ABNORMAL HIGH (ref 0.61–1.24)
GFR calc Af Amer: 47 mL/min — ABNORMAL LOW (ref >60)
GFR calc non Af Amer: 41 mL/min — ABNORMAL LOW (ref >60)
Glucose, Bld: 139 mg/dL — ABNORMAL HIGH (ref 70–99)
Potassium: 4.1 mmol/L (ref 3.5–5.1)
Sodium: 136 mmol/L (ref 135–145)
Total Bilirubin: 1 mg/dL (ref 0.3–1.2)
Total Protein: 6.5 g/dL (ref 6.5–8.1)

## 2018-04-22 LAB — URINALYSIS, ROUTINE W REFLEX MICROSCOPIC
Bacteria, UA: NONE SEEN
Bilirubin Urine: NEGATIVE
Glucose, UA: NEGATIVE mg/dL
Ketones, ur: NEGATIVE mg/dL
Nitrite: NEGATIVE
Protein, ur: 30 mg/dL — AB
Specific Gravity, Urine: 1.014 (ref 1.005–1.030)
WBC, UA: >50 WBC/hpf — ABNORMAL HIGH (ref 0–5)
pH: 5 (ref 5.0–8.0)

## 2018-04-22 LAB — LIPASE, BLOOD: Lipase: 23 U/L (ref 11–51)

## 2018-04-22 LAB — I-STAT TROPONIN, ED: Troponin i, poc: 0.04 ng/mL (ref 0.00–0.08)

## 2018-04-22 LAB — C DIFFICILE QUICK SCREEN W PCR REFLEX
C Diff antigen: NEGATIVE
C Diff interpretation: NOT DETECTED
C Diff toxin: NEGATIVE

## 2018-04-22 MED ORDER — AMLODIPINE BESYLATE 2.5 MG PO TABS
5.0000 mg | ORAL_TABLET | Freq: Every day | ORAL | Status: DC
  Administered 2018-04-23 – 2018-04-30 (×8): 5 mg via ORAL
  Filled 2018-04-22 (×4): qty 2
  Filled 2018-04-22 (×2): qty 1
  Filled 2018-04-22 (×2): qty 2

## 2018-04-22 MED ORDER — ENSURE ENLIVE PO LIQD
237.0000 mL | Freq: Three times a day (TID) | ORAL | Status: DC
  Administered 2018-04-22: 237 mL via ORAL

## 2018-04-22 MED ORDER — LEVOTHYROXINE SODIUM 88 MCG PO TABS
88.0000 ug | ORAL_TABLET | Freq: Every day | ORAL | Status: DC
  Administered 2018-04-23 – 2018-04-29 (×7): 88 ug via ORAL
  Filled 2018-04-22 (×8): qty 1

## 2018-04-22 MED ORDER — VITAMIN B-12 1000 MCG PO TABS
1000.0000 ug | ORAL_TABLET | Freq: Every day | ORAL | Status: DC
  Administered 2018-04-23 – 2018-04-30 (×7): 1000 ug via ORAL
  Filled 2018-04-22 (×8): qty 1

## 2018-04-22 MED ORDER — CLOPIDOGREL BISULFATE 75 MG PO TABS
75.0000 mg | ORAL_TABLET | Freq: Every day | ORAL | Status: DC
  Administered 2018-04-23 – 2018-04-30 (×8): 75 mg via ORAL
  Filled 2018-04-22 (×8): qty 1

## 2018-04-22 MED ORDER — ASPIRIN EC 81 MG PO TBEC
81.0000 mg | DELAYED_RELEASE_TABLET | Freq: Every day | ORAL | Status: DC
  Administered 2018-04-23 – 2018-04-30 (×8): 81 mg via ORAL
  Filled 2018-04-22 (×8): qty 1

## 2018-04-22 MED ORDER — ADULT MULTIVITAMIN W/MINERALS CH
1.0000 | ORAL_TABLET | Freq: Every day | ORAL | Status: DC
  Administered 2018-04-23 – 2018-04-30 (×8): 1 via ORAL
  Filled 2018-04-22 (×9): qty 1

## 2018-04-22 MED ORDER — ATORVASTATIN CALCIUM 10 MG PO TABS
20.0000 mg | ORAL_TABLET | Freq: Every day | ORAL | Status: DC
  Administered 2018-04-23 – 2018-04-30 (×8): 20 mg via ORAL
  Filled 2018-04-22 (×8): qty 2

## 2018-04-22 MED ORDER — FOLIC ACID 1 MG PO TABS
3.0000 mg | ORAL_TABLET | Freq: Every day | ORAL | Status: DC
  Administered 2018-04-23 – 2018-04-30 (×8): 3 mg via ORAL
  Filled 2018-04-22 (×8): qty 3

## 2018-04-22 MED ORDER — ONDANSETRON HCL 4 MG PO TABS: 4.0000 mg | ORAL_TABLET | Freq: Four times a day (QID) | ORAL | Status: DC | PRN

## 2018-04-22 MED ORDER — SODIUM CHLORIDE 0.9 % IV SOLN
1.0000 g | Freq: Once | INTRAVENOUS | Status: AC
  Administered 2018-04-22: 1 g via INTRAVENOUS
  Filled 2018-04-22: qty 10

## 2018-04-22 MED ORDER — SODIUM CHLORIDE 0.9 % IV BOLUS
500.0000 mL | Freq: Once | INTRAVENOUS | Status: AC
  Administered 2018-04-22: 500 mL via INTRAVENOUS

## 2018-04-22 MED ORDER — ENOXAPARIN SODIUM 30 MG/0.3ML ~~LOC~~ SOLN
30.0000 mg | SUBCUTANEOUS | Status: DC
  Administered 2018-04-23 – 2018-04-25 (×3): 30 mg via SUBCUTANEOUS
  Filled 2018-04-22 (×3): qty 0.3

## 2018-04-22 MED ORDER — ACETAMINOPHEN 500 MG PO TABS
1000.0000 mg | ORAL_TABLET | Freq: Two times a day (BID) | ORAL | Status: DC
  Administered 2018-04-22 – 2018-04-30 (×15): 1000 mg via ORAL
  Filled 2018-04-22 (×15): qty 2

## 2018-04-22 MED ORDER — PREDNISONE 5 MG PO TABS
5.0000 mg | ORAL_TABLET | Freq: Every day | ORAL | Status: DC
  Administered 2018-04-23 – 2018-04-30 (×7): 5 mg via ORAL
  Filled 2018-04-22 (×8): qty 1

## 2018-04-22 MED ORDER — CARVEDILOL 6.25 MG PO TABS
6.2500 mg | ORAL_TABLET | Freq: Two times a day (BID) | ORAL | Status: DC
  Administered 2018-04-23 – 2018-04-30 (×15): 6.25 mg via ORAL
  Filled 2018-04-22 (×15): qty 1

## 2018-04-22 MED ORDER — PANTOPRAZOLE SODIUM 40 MG PO TBEC
40.0000 mg | DELAYED_RELEASE_TABLET | Freq: Every day | ORAL | Status: DC
  Administered 2018-04-23 – 2018-04-30 (×8): 40 mg via ORAL
  Filled 2018-04-22 (×8): qty 1

## 2018-04-22 MED ORDER — ONDANSETRON HCL 4 MG/2ML IJ SOLN
4.0000 mg | Freq: Four times a day (QID) | INTRAMUSCULAR | Status: DC | PRN
  Administered 2018-04-22 – 2018-04-23 (×2): 4 mg via INTRAVENOUS
  Filled 2018-04-22 (×2): qty 2

## 2018-04-22 MED ORDER — PRO-STAT SUGAR FREE PO LIQD: 30.0000 mL | Freq: Two times a day (BID) | ORAL | Status: DC

## 2018-04-22 MED ORDER — LOPERAMIDE HCL 2 MG PO CAPS
2.0000 mg | ORAL_CAPSULE | Freq: Four times a day (QID) | ORAL | Status: DC | PRN
  Administered 2018-04-23 – 2018-04-25 (×3): 2 mg via ORAL
  Filled 2018-04-22 (×3): qty 1

## 2018-04-22 MED ORDER — SODIUM CHLORIDE 0.9 % IV SOLN
INTRAVENOUS | Status: DC
  Administered 2018-04-22 – 2018-04-27 (×6): via INTRAVENOUS

## 2018-04-22 MED ORDER — ONDANSETRON HCL 4 MG/2ML IJ SOLN
4.0000 mg | Freq: Once | INTRAMUSCULAR | Status: AC
  Administered 2018-04-22: 4 mg via INTRAVENOUS
  Filled 2018-04-22: qty 2

## 2018-04-22 NOTE — ED Triage Notes (Signed)
Pt brought in by son for c/o n/v and generalized abd pain since last night around 3am ; no blood in vomit

## 2018-04-22 NOTE — ED Provider Notes (Signed)
[facility] EMERGENCY DEPARTMENT Provider Note   CSN: [id number] Arrival date & time: 04/22/18  1253     History   Chief Complaint Chief Complaint  Patient presents with  . Abdominal Pain  . Nausea    HPI Hunter Choi is a 85 y.o. male.  HPI Hunter Choi is a 85 y.o. male with history of abdominal aortic aneurysm, coronary artery disease, CHF, acid reflux, hypertension, paroxysmal A. fib, presents to emergency department with complaint of nausea, vomiting, diarrhea.  Patient symptoms started around 3 AM this morning. Associated fever of 101, received tylenol. He has had episode of emesis approximately every half an hour according to his son.  He is also had about 4 episodes of watery diarrhea.  Patient also complained of some abdominal pain but states pain has now improved.  There was no blood in his stool or emesis.  He denied any chest pain or shortness of breath.  He has been doing well over the last few weeks, in fact he was in the rehab facility due to generalized weakness and was just released 2 days ago.  No one in the family is sick, however it is unclear if he had any sick contacts at that rehab facility.  Past Medical History:  Diagnosis Date  . AAA (abdominal aortic aneurysm) (HCC)    Dr. Ashley Royalty  . Aortic stenosis, severe    a. s/p TAVR  . Blind left eye   . BPH (benign prostatic hypertrophy)    Dr. Retta Diones  . CAD (coronary artery disease) of bypass graft    Chronic occlusion of saphenous vein graft to RCA  . Coronary artery disease    Left main disease and severe 3-vessel CAD  . Diastolic heart failure (HCC)   . Embolism involving retinal artery   . GERD (gastroesophageal reflux disease)   . Hard of hearing   . Hypertension   . Hypothyroidism   . Iliac artery aneurysm (HCC)    Dr. Edilia Bo  . PAF (paroxysmal atrial fibrillation) (HCC)   . 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 12/2012: mm Stephannie Peters XT  transcatheter heart valve placed via transapical approach  . Stroke [facility])    mini stroke effective his eye left    Patient Active Problem List   Diagnosis Date Noted  . B12 deficiency 03/27/2018  . Hypothyroidism 03/27/2018  . Chronic combined systolic and diastolic CHF (congestive heart failure) (HCC) 03/27/2018  . Protein-calorie malnutrition, severe 03/24/2018  . Generalized weakness   . Acute metabolic encephalopathy 03/22/2018  . CKD (chronic kidney disease) stage 3, GFR 30-59 ml/min (HCC) 12/09/2017  . SIRS (systemic inflammatory response syndrome) (HCC) 08/03/2017  . Essential hypertension 08/03/2017  . Elevated troponin   . Sepsis due to urinary tract infection (HCC) 10/02/2015  . Hard of hearing   . Pressure ulcer 01/28/2015  . Acute on chronic combined systolic and diastolic congestive heart failure, NYHA class 4 (HCC) 01/27/2015  . Hyponatremia 11/21/2014  . Elevated LFTs 11/14/2014  . Anemia 05/23/2014  . Thrombocytopenia (HCC) 02/21/2014  . Malnutrition of moderate degree (HCC) 02/21/2014  . Occlusion and stenosis of carotid artery without mention of cerebral infarction 09/04/2013  . Aftercare following surgery of the circulatory system, NEC 09/04/2013  . AAA (abdominal aortic aneurysm) without rupture (HCC) 03/06/2013  . 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 Coronary artery disease involving coronary bypass graft of native heart with angina pectoris (HCC)   .  GERD (gastroesophageal reflux disease)   . Thyroid disease   . Embolism involving retinal artery   . Iliac artery aneurysm (HCC)   . Rheumatoid arthritis (HCC) 04/22/2010    Past Surgical History:  Procedure Laterality Date  . BIOPSY PROSTATE  2005  . BUNIONECTOMY WITH HAMMERTOE RECONSTRUCTION Bilateral   . CARDIAC CATHETERIZATION    . CARDIAC CATHETERIZATION N/A 05/07/2015   Procedure: Right Heart Cath and Coronary/Graft Angiography;  Surgeon: Lyn RecordsHenry W Smith, MD;  [facility];  Service: Cardiovascular;  Laterality: N/A;  . CAROTID ENDARTERECTOMY Right Jan. 2, 1997  . CATARACT EXTRACTION W/ INTRAOCULAR LENS IMPLANT Right   . CHOLECYSTECTOMY N/A 01/28/2015   Procedure: LAPAROSCOPIC CHOLECYSTECTOMY WITH INTRAOPERATIVE CHOLANGIOGRAM;  Surgeon: Gaynelle AduEric Wilson, MD;  [facility];  Service: General;  Laterality: N/A;  . COLONOSCOPY    . CORONARY ARTERY BYPASS GRAFT  01/1998   Dr. Sheliah PlaneEdward Gerhardt; LIMA to LAD, SVG to D1, SVG to LCx, SVG to RCA, open saphenous vein harvest via right lower extremity  . HARVEST BONE GRAFT  1982   FOR THE  ANKLE  . HEMIARTHROPLASTY HIP Left    AFTER HIP FX  . INTRAOPERATIVE TRANSESOPHAGEAL ECHOCARDIOGRAM N/A 01/15/2013   Procedure: INTRAOPERATIVE TRANSESOPHAGEAL ECHOCARDIOGRAM;  Surgeon: Alleen BorneBryan K Bartle, MD;  [facility];  Service: Open Heart Surgery;  Laterality: N/A;  . JOINT REPLACEMENT    . LEFT AND RIGHT HEART CATHETERIZATION WITH CORONARY ANGIOGRAM N/A 12/20/2012   Procedure: LEFT AND RIGHT HEART CATHETERIZATION WITH CORONARY ANGIOGRAM;  Surgeon: Lesleigh NoeHenry W Smith III, MD;  [facility];  Service: Cardiovascular;  Laterality: N/A;  . LEFT HEART CATH AND CORS/GRAFTS ANGIOGRAPHY N/A 02/14/2017   Procedure: LEFT HEART CATH AND CORS/GRAFTS ANGIOGRAPHY;  Surgeon: Marykay LexHarding, David W, MD;  [facility];  Service: Cardiovascular;  Laterality: N/A;  . SKIN GRAFTS  1968  . TOTAL HIP ARTHROPLASTY Left 2007  . TOTAL KNEE ARTHROPLASTY  1992-2002   right-left  . TRANSCATHETER AORTIC VALVE REPLACEMENT, TRANSAPICAL N/A 01/15/2013   Procedure: TRANSCATHETER AORTIC VALVE REPLACEMENT, TRANSAPICAL;  Surgeon: Alleen BorneBryan K Bartle, MD;  Location: [facility] OR;  Service: Open Heart Surgery;  Laterality: N/A;  . TRANSURETHRAL RESECTION OF PROSTATE  2012        Home Medications    Prior to Admission medications   Medication Sig Start Date End Date Taking? Authorizing Provider  acetaminophen (TYLENOL) 500 MG tablet Take 1,000 mg  by mouth 2 (two) times daily.    [provider]  Amino Acids-Protein Hydrolys (FEEDING SUPPLEMENT, PRO-STAT SUGAR FREE 64,) LIQD Take 30 mLs by mouth 2 (two) times daily. 03/27/18   Dhungel, Nishant, MD  amLODipine (NORVASC) 5 MG tablet Take 5 mg by mouth daily.    [provider]  aspirin EC 81 MG EC tablet Take 1 tablet (81 mg total) by mouth daily. 01/21/13   Ardelle BallsZimmerman, Donielle M, PA-C  atorvastatin (LIPITOR) 20 MG tablet Take 20 mg by mouth daily.    [provider]  carvedilol (COREG) 6.25 MG tablet Take 1 tablet (6.25 mg total) by mouth 2 (two) times daily with a meal. 10/21/15   Lyn RecordsSmith, Henry W, MD  clopidogrel (PLAVIX) 75 MG tablet TAKE 1 TABLET BY MOUTH EVERY DAY 04/16/18   Leone BrandIngold, Laura R, NP  feeding supplement, ENSURE ENLIVE, (ENSURE ENLIVE) LIQD Take 237 mLs by mouth 3 (three) times daily between meals. 03/27/18   Dhungel, Theda BelfastNishant, MD  folic acid (FOLVITE) 1 MG tablet Take 3 mg  by mouth daily.    [provider]  furosemide (LASIX) 20 MG tablet TAKE 2 TABLETS BY MOUTH ONE TIME A DAY Patient taking differently: Take 40 mg by mouth daily.  02/19/18   Lyn Records, MD  levothyroxine (SYNTHROID, LEVOTHROID) 88 MCG tablet Take 1 tablet (88 mcg total) by mouth daily. 03/27/18   Dhungel, Theda Belfast, MD  methotrexate 2.5 MG tablet Take 25 mg by mouth every Friday.     [provider]  Multiple Vitamin (MULTIVITAMIN WITH MINERALS) TABS tablet Take 1 tablet by mouth daily. 03/27/18   Dhungel, Nishant, MD  nitroGLYCERIN (NITROSTAT) 0.4 MG SL tablet PLACE 1 TABLET (0.4 MG TOTAL) UNDER THE TONGUE EVERY 5 MINUTES AS NEEDED FOR CHEST PAIN Patient taking differently: Place 0.4 mg under the tongue every 5 (five) minutes as needed.  12/21/16   Lyn Records, MD  pantoprazole (PROTONIX) 40 MG tablet Take 1 tablet (40 mg total) by mouth daily. 02/18/17   Azalee Course, PA  predniSONE (DELTASONE) 5 MG tablet Take 5 mg by mouth daily. 04/07/15   [provider]    vitamin B-12 (CYANOCOBALAMIN) 1000 MCG tablet Take 1 tablet (1,000 mcg total) by mouth daily. 03/27/18   Eddie North, MD    Family History Family History  Problem Relation Age of Onset  . Heart attack Mother   . Heart disease Mother        Before age 51  . Hyperlipidemia Mother   . Hypertension Mother   . Hypertension Son   . Heart disease Father        AAA-   . Hyperlipidemia Father   . Heart attack Father   . Hypertension Father   . Heart disease Brother        Before age 68  . Hyperlipidemia Brother   . Heart attack Brother   . Hypertension Brother     Social History Social History   Tobacco Use  . Smoking status: Former Smoker    Packs/day: 0.50    Years: 37.00    Pack years: 18.50    Types: Cigarettes    Last attempt to quit: 03/29/1963    Years since quitting: 55.1  . Smokeless tobacco: Former Neurosurgeon    Types: Chew  Substance Use Topics  . Alcohol use: No  . Drug use: No     Allergies   Ciprofloxacin   Review of Systems Review of Systems  Constitutional: Negative for chills and fever.  Respiratory: Negative for cough, chest tightness and shortness of breath.   Cardiovascular: Negative for chest pain, palpitations and leg swelling.  Gastrointestinal: Positive for abdominal pain, diarrhea, nausea and vomiting. Negative for abdominal distention.  Genitourinary: Negative for dysuria, frequency, hematuria and urgency.  Musculoskeletal: Negative for arthralgias, myalgias, neck pain and neck stiffness.  Skin: Negative for rash.  Allergic/Immunologic: Negative for immunocompromised state.  Neurological: Negative for dizziness, weakness, light-headedness, numbness and headaches.  All other systems reviewed and are negative.    Physical Exam Updated Vital Signs BP 120/64   Pulse 81   Resp 18   SpO2 98%   Physical Exam Vitals signs and nursing note reviewed.  Constitutional:      General: He is not in acute distress.    Appearance: He is  well-developed.  HENT:     Head: Normocephalic and atraumatic.  Eyes:     Conjunctiva/sclera: Conjunctivae normal.  Neck:     Musculoskeletal: Neck supple.  Cardiovascular:     Rate and Rhythm: Normal  rate and regular rhythm.     Heart sounds: Normal heart sounds.  Pulmonary:     Effort: Pulmonary effort is normal. No respiratory distress.     Breath sounds: No wheezing or rales.  Abdominal:     General: Bowel sounds are normal. There is no distension.     Palpations: Abdomen is soft.     Tenderness: There is no abdominal tenderness. There is no rebound.  Skin:    General: Skin is warm and dry.  Neurological:     Mental Status: He is alert.      ED Treatments / Results  Labs (all labs ordered are listed, but only abnormal results are displayed) Labs Reviewed  C DIFFICILE QUICK SCREEN W PCR REFLEX  CBC WITH DIFFERENTIAL/PLATELET  COMPREHENSIVE METABOLIC PANEL  LIPASE, BLOOD  URINALYSIS, ROUTINE W REFLEX MICROSCOPIC  I-STAT TROPONIN, ED    EKG EKG Interpretation  Date/Time:  Sunday April 22 2018 13:01:19 EST Ventricular Rate:  82 PR Interval:    QRS Duration: 194 QT Interval:  421 QTC Calculation: 492 R Axis:   111 Text Interpretation:  Sinus rhythm Prolonged PR interval Left bundle branch block No significant change since last tracing Confirmed by Alvira MondaySchlossman, Erin ([id number]) on 04/22/2018 1:04:18 PM   Radiology No results found.  Procedures Procedures (including critical care time)  Medications Ordered in ED Medications  sodium chloride 0.9 % bolus 500 mL (has no administration in time range)     Initial Impression / Assessment and Plan / ED Course  I have reviewed the triage vital signs and the nursing notes.  Pertinent labs & imaging results that were available during my care of the patient were reviewed by me and considered in my medical decision making (see chart for details).     1:22 PM Patient seen and examined.  Patient with abdominal pain,  nausea, vomiting, diarrhea since 3 AM this morning.  Patient is not actively vomiting but states he still feels nauseated.  His abdomen is benign on exam.  Son states his diarrhea was "pure water."  I will get labs, order fluids and Zofran for his nausea, and try to get a stool sample for C. difficile.  Will reassess patient afterwards.  Vital signs are normal.  Does not meet Sirs criteria.  3:39 PM Blood pressures are now 90s over 40s.  I will give patient another 500 cc fluid.  Urine analysis positive for UTI.  I will start him on Rocephin.  Will admit for treatment of nausea, vomiting, diarrhea and UTI. Patient states that he is actually feeling a little better and is tolerating sips of Coke.  Spoke with hospitalist, they will admit patient.  Vitals:   04/22/18 1301 04/22/18 1400 04/22/18 1503  BP: 120/64 (!) 114/57   Pulse: 81 69   Resp: 18 20   Temp:   98.4 F (36.9 C)  TempSrc:   Oral  SpO2: 98% 94%      Final Clinical Impressions(s) / ED Diagnoses   Final diagnoses:  Acute cystitis with hematuria  Nausea vomiting and diarrhea    ED Discharge Orders    None       Jaynie CrumbleKirichenko, Bostyn Kunkler, PA-C 04/22/18 1657    Alvira MondaySchlossman, Erin, MD 04/24/18 2119

## 2018-04-22 NOTE — H&P (Signed)
History and Physical    Hunter Choi UJW:[id number] DOB: 10-Jun-1927 DOA: 04/22/2018  PCP: Lorenda IshiharaVaradarajan, [name], MD  Patient coming from: Home.  I have personally briefly reviewed patient's old medical records available.   Chief Complaint: Nausea vomiting and diarrhea.  HPI: Hunter Choi is a 85 y.o. male with medical history significant of Rheumatoid arthritis on methotrexate and prednisone, stroke, GERD, AAA, BPH, hypothyroidism, coronary artery disease, chronic systolic heart failure with EF of 30%, paroxysmal atrial fibrillation, status post TAVR with recent hospitalization and discharge to a skilled nursing facility where he did very well comes back to the emergency room with sudden onset multiple episodes of loose watery stool and vomiting.  Patient is accompanied by patient's son who lives with him 24/7.  According to him, patient did very well at the inpatient therapies and went home on Friday.  Early morning today at about 3 AM patient woke up his son complaining of diarrhea, he had about 6 episodes of loose watery diarrhea, he had 3 or 4 episodes of vomiting, small food particles.  Patient did not complain of any abdominal pain.  No tenesmus.  No fever or chills.  No unusual food.  Stool reported no foul-smelling.  Arrived in the ER about 12 PM and has not have any bowel movement since last 4 hours. ED Course: Hemodynamically stable.  Clinically dehydrated.  No active vomiting.  Able to drink.  Urinalysis shows abnormal urine, patient does not have any urinary symptoms.  Creatinine at baseline.  WBC count normal. Patient was presumed to have UTI, 1 dose of Rocephin was given in the ER.  Patient was given 500 mL of IV fluids and advised observation because of severity of symptoms.  Review of Systems: As per HPI otherwise 10 point review of systems negative.    Past Medical History:  Diagnosis Date  . AAA (abdominal aortic aneurysm) (HCC)    Dr. Ashley Royalty. Dickson  . Aortic stenosis,  severe    a. s/p TAVR  . Blind left eye   . BPH (benign prostatic hypertrophy)    Dr. Retta Dionesahlstedt  . CAD (coronary artery disease) of bypass graft    Chronic occlusion of saphenous vein graft to RCA  . Coronary artery disease    Left main disease and severe 3-vessel CAD  . Diastolic heart failure (HCC)   . Embolism involving retinal artery   . GERD (gastroesophageal reflux disease)   . Hard of hearing   . Hypertension   . Hypothyroidism   . Iliac artery aneurysm (HCC)    Dr. Edilia Boickson  . PAF (paroxysmal atrial fibrillation) (HCC)   . 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 12/2012: mm Stephannie PetersEdwards Sapien XT transcatheter heart valve placed via transapical approach  . Stroke [facility](HCC)    mini stroke effective his eye left    Past Surgical History:  Procedure Laterality Date  . BIOPSY PROSTATE  2005  . BUNIONECTOMY WITH HAMMERTOE RECONSTRUCTION Bilateral   . CARDIAC CATHETERIZATION    . CARDIAC CATHETERIZATION N/A 05/07/2015   Procedure: Right Heart Cath and Coronary/Graft Angiography;  Surgeon: Lyn RecordsHenry W Smith, MD;  [facility];  Service: Cardiovascular;  Laterality: N/A;  . CAROTID ENDARTERECTOMY Right Jan. 2, 1997  . CATARACT EXTRACTION W/ INTRAOCULAR LENS IMPLANT Right   . CHOLECYSTECTOMY N/A 01/28/2015   Procedure: LAPAROSCOPIC CHOLECYSTECTOMY WITH INTRAOPERATIVE CHOLANGIOGRAM;  Surgeon: Gaynelle AduEric Wilson, MD;  [facility];  Service: General;  Laterality: N/A;  . COLONOSCOPY    .  CORONARY ARTERY BYPASS GRAFT  01/1998   Dr. Sheliah Plane; LIMA to LAD, SVG to D1, SVG to LCx, SVG to RCA, open saphenous vein harvest via right lower extremity  . HARVEST BONE GRAFT  1982   FOR THE  ANKLE  . HEMIARTHROPLASTY HIP Left    AFTER HIP FX  . INTRAOPERATIVE TRANSESOPHAGEAL ECHOCARDIOGRAM N/A 01/15/2013   Procedure: INTRAOPERATIVE TRANSESOPHAGEAL ECHOCARDIOGRAM;  Surgeon: [name], MD;  Location: [facility] OR;  Service: Open Heart Surgery;  Laterality: N/A;  .  JOINT REPLACEMENT    . LEFT AND RIGHT HEART CATHETERIZATION WITH CORONARY ANGIOGRAM N/A 12/20/2012   Procedure: LEFT AND RIGHT HEART CATHETERIZATION WITH CORONARY ANGIOGRAM;  Surgeon: Lesleigh Noe, MD;  Location: [facility] CATH LAB;  Service: Cardiovascular;  Laterality: N/A;  . LEFT HEART CATH AND CORS/GRAFTS ANGIOGRAPHY N/A 02/14/2017   Procedure: LEFT HEART CATH AND CORS/GRAFTS ANGIOGRAPHY;  Surgeon: Marykay Lex, MD;  Location: [facility] INVASIVE CV LAB;  Service: Cardiovascular;  Laterality: N/A;  . SKIN GRAFTS  1968  . TOTAL HIP ARTHROPLASTY Left 2007  . TOTAL KNEE ARTHROPLASTY  1992-2002   right-left  . TRANSCATHETER AORTIC VALVE REPLACEMENT, TRANSAPICAL N/A 01/15/2013   Procedure: TRANSCATHETER AORTIC VALVE REPLACEMENT, TRANSAPICAL;  Surgeon: [name], MD;  Location: [facility] OR;  Service: Open Heart Surgery;  Laterality: N/A;  . TRANSURETHRAL RESECTION OF PROSTATE  2012     reports that he quit smoking about 55 years ago. His smoking use included cigarettes. He has a 18.50 pack-year smoking history. He has quit using smokeless tobacco.  His smokeless tobacco use included chew. He reports that he does not drink alcohol or use drugs.  Allergies  Allergen Reactions  . Ciprofloxacin Other (See Comments)    REACTION: mild delirium    Family History  Problem Relation Age of Onset  . Heart attack Mother   . Heart disease Mother        Before age 16  . Hyperlipidemia Mother   . Hypertension Mother   . Hypertension Son   . Heart disease Father        AAA-   . Hyperlipidemia Father   . Heart attack Father   . Hypertension Father   . Heart disease Brother        Before age 73  . Hyperlipidemia Brother   . Heart attack Brother   . Hypertension Brother      Prior to Admission medications   Medication Sig Start Date End Date Taking? Authorizing Provider  acetaminophen (TYLENOL) 500 MG tablet Take 1,000 mg by mouth 2 (two) times daily.   Yes [provider]  Amino  Acids-Protein Hydrolys (FEEDING SUPPLEMENT, PRO-STAT SUGAR FREE 64,) LIQD Take 30 mLs by mouth 2 (two) times daily. 03/27/18  Yes Dhungel, Nishant, MD  amLODipine (NORVASC) 5 MG tablet Take 5 mg by mouth daily.   Yes [provider]  aspirin EC 81 MG EC tablet Take 1 tablet (81 mg total) by mouth daily. 01/21/13  Yes Doree Fudge M, PA-C  atorvastatin (LIPITOR) 20 MG tablet Take 20 mg by mouth daily.   Yes [provider]  carvedilol (COREG) 6.25 MG tablet Take 1 tablet (6.25 mg total) by mouth 2 (two) times daily with a meal. 10/21/15  Yes Lyn Records, MD  clopidogrel (PLAVIX) 75 MG tablet TAKE 1 TABLET BY MOUTH EVERY DAY Patient taking differently: Take 75 mg by mouth daily.  04/16/18  Yes Leone Brand, NP  feeding supplement, ENSURE ENLIVE, (  ENSURE ENLIVE) LIQD Take 237 mLs by mouth 3 (three) times daily between meals. 03/27/18  Yes Dhungel, Nishant, MD  folic acid (FOLVITE) 1 MG tablet Take 3 mg by mouth daily.   Yes [provider]  furosemide (LASIX) 20 MG tablet TAKE 2 TABLETS BY MOUTH ONE TIME A DAY Patient taking differently: Take 40 mg by mouth daily.  02/19/18  Yes Lyn RecordsSmith, Henry W, MD  levothyroxine (SYNTHROID, LEVOTHROID) 88 MCG tablet Take 1 tablet (88 mcg total) by mouth daily. 03/27/18  Yes Dhungel, Nishant, MD  losartan (COZAAR) 25 MG tablet Take 25 mg by mouth daily. 03/19/18  Yes [provider]  methotrexate 2.5 MG tablet Take 25 mg by mouth every Friday.    Yes [provider]  Multiple Vitamin (MULTIVITAMIN WITH MINERALS) TABS tablet Take 1 tablet by mouth daily. 03/27/18  Yes Dhungel, Nishant, MD  nitroGLYCERIN (NITROSTAT) 0.4 MG SL tablet PLACE 1 TABLET (0.4 MG TOTAL) UNDER THE TONGUE EVERY 5 MINUTES AS NEEDED FOR CHEST PAIN Patient taking differently: Place 0.4 mg under the tongue every 5 (five) minutes as needed.  12/21/16  Yes Lyn RecordsSmith, Henry W, MD  pantoprazole (PROTONIX) 40 MG tablet Take 1 tablet (40 mg total) by mouth  daily. 02/18/17  Yes Azalee CourseMeng, Hao, PA  predniSONE (DELTASONE) 5 MG tablet Take 5 mg by mouth daily. 04/07/15  Yes [provider]  vitamin B-12 (CYANOCOBALAMIN) 1000 MCG tablet Take 1 tablet (1,000 mcg total) by mouth daily. 03/27/18  Yes Dhungel, Theda BelfastNishant, MD    Physical Exam: Vitals:   04/22/18 1301 04/22/18 1400 04/22/18 1503  BP: 120/64 (!) 114/57   Pulse: 81 69   Resp: 18 20   Temp:   98.4 F (36.9 C)  TempSrc:   Oral  SpO2: 98% 94%     Constitutional: NAD, calm, comfortable Vitals:   04/22/18 1301 04/22/18 1400 04/22/18 1503  BP: 120/64 (!) 114/57   Pulse: 81 69   Resp: 18 20   Temp:   98.4 F (36.9 C)  TempSrc:   Oral  SpO2: 98% 94%    Eyes: PERRL, lids and conjunctivae normal Hard of hearing.  Comfortable on 1 L of oxygen. ENMT: Mucous membranes are dry. Posterior pharynx clear of any exudate or lesions.Normal dentition.  Neck: normal, supple, no masses, no thyromegaly Respiratory: clear to auscultation bilaterally, no wheezing, no crackles. Normal respiratory effort. No accessory muscle use.  Coughing in between. Cardiovascular: Regular rate and rhythm, no murmurs / rubs / gallops. No extremity edema. 2+ pedal pulses. No carotid bruits.  Abdomen: no tenderness, no masses palpated. No hepatosplenomegaly. Bowel sounds positive.  Musculoskeletal: no clubbing / cyanosis. No joint deformity upper and lower extremities. Good ROM, no contractures. Normal muscle tone.  Skin: no rashes, lesions, ulcers. No induration Neurologic: CN 2-12 grossly intact. Sensation intact, DTR normal. Strength 5/5 in all 4.  Psychiatric: Normal judgment and insight. Alert and oriented x 3. Normal mood.     Labs on Admission: I have personally reviewed following labs and imaging studies  CBC: Recent Labs  Lab 04/22/18 1311  WBC 5.2  NEUTROABS 3.8  HGB 9.4*  HCT 31.8*  MCV 90.1  PLT 152   Basic Metabolic Panel: Recent Labs  Lab 04/22/18 1311  NA 136  K 4.1  CL 102  CO2 23    GLUCOSE 139*  BUN 36*  CREATININE 1.51*  CALCIUM 8.3*   GFR: CrCl cannot be calculated (Unknown ideal weight.). Liver Function Tests: Recent Labs  Lab  04/22/18 1311  AST 20  ALT 10  ALKPHOS 52  BILITOT 1.0  PROT 6.5  ALBUMIN 3.5   Recent Labs  Lab 04/22/18 1311  LIPASE 23   No results for input(s): AMMONIA in the last 168 hours. Coagulation Profile: No results for input(s): INR, PROTIME in the last 168 hours. Cardiac Enzymes: No results for input(s): CKTOTAL, CKMB, CKMBINDEX, TROPONINI in the last 168 hours. BNP (last 3 results) No results for input(s): PROBNP in the last 8760 hours. HbA1C: No results for input(s): HGBA1C in the last 72 hours. CBG: No results for input(s): GLUCAP in the last 168 hours. Lipid Profile: No results for input(s): CHOL, HDL, LDLCALC, TRIG, CHOLHDL, LDLDIRECT in the last 72 hours. Thyroid Function Tests: No results for input(s): TSH, T4TOTAL, FREET4, T3FREE, THYROIDAB in the last 72 hours. Anemia Panel: No results for input(s): VITAMINB12, FOLATE, FERRITIN, TIBC, IRON, RETICCTPCT in the last 72 hours. Urine analysis:    Component Value Date/Time   COLORURINE YELLOW 04/22/2018 1500   APPEARANCEUR HAZY (A) 04/22/2018 1500   LABSPEC 1.014 04/22/2018 1500   PHURINE 5.0 04/22/2018 1500   GLUCOSEU NEGATIVE 04/22/2018 1500   HGBUR MODERATE (A) 04/22/2018 1500   BILIRUBINUR NEGATIVE 04/22/2018 1500   KETONESUR NEGATIVE 04/22/2018 1500   PROTEINUR 30 (A) 04/22/2018 1500   UROBILINOGEN 4.0 (H) 11/14/2014 1849   NITRITE NEGATIVE 04/22/2018 1500   LEUKOCYTESUR MODERATE (A) 04/22/2018 1500    Radiological Exams on Admission: No results found.  EKG: Independently reviewed.  Sinus rhythm.  Prolonged PR.  Left bundle branch block.  Comparable to previous EKGs.  Assessment/Plan Principal Problem:   Nausea vomiting and diarrhea Active Problems:   Rheumatoid arthritis (HCC)   GERD (gastroesophageal reflux disease)   Thyroid disease    Coronary artery disease involving coronary bypass graft of native heart with angina pectoris (HCC)   S/P TAVR (transcatheter aortic valve replacement)   Essential hypertension   Hypothyroidism   Intractable nausea vomiting and diarrhea: Suspect acute gastroenteritis.  Already improving symptoms.  Agree with monitoring given severity of symptoms and multiple comorbidities.  Patient given 500 mL IV fluids, will keep on gentle IV fluids overnight.  Encourage oral liquids.  Hold diuresis and losartan today.  Symptomatic management with antinausea medications and Imodium. No clinical evidence of C. difficile, no abdominal pain, no fever, no leukocytosis.  Abnormal urinalysis with no urinary symptoms. Received a dose of Rocephin in the ER.  Urine cultures has been sent.  Will hold off on further antibiotics until urine cultures.   Rheumatoid arthritis: On methotrexate and prednisone.  Symptoms well controlled.  Continue.  GERD: On PPI.  Continue.  Coronary artery disease: Currently without chest pain.  Patient is on dual antiplatelet therapy and beta-blockers that he will continue.  Holding ACE inhibitor's.  Essential hypertension: Stable.  Resume home medications.  Hypothyroidism: Clinically euthyroid.  Resume home thyroid medications.  DVT prophylaxis: Lovenox subcu. Code Status: Full code. Family Communication: Son at the bedside. Disposition Plan: Home with home health care. Consults called: None. Admission status: Observation, MedSurg.   Dorcas Carrow MD [facility] Pager [phone]  If 7PM-7AM, please contact night-coverage [email or web address] Password [email or web address]  04/22/2018, 4:10 PM

## 2018-04-23 ENCOUNTER — Observation Stay (HOSPITAL_COMMUNITY): Payer: Medicare Other

## 2018-04-23 DIAGNOSIS — E039 Hypothyroidism, unspecified: Secondary | ICD-10-CM

## 2018-04-23 DIAGNOSIS — M069 Rheumatoid arthritis, unspecified: Secondary | ICD-10-CM | POA: Diagnosis not present

## 2018-04-23 DIAGNOSIS — I10 Essential (primary) hypertension: Secondary | ICD-10-CM

## 2018-04-23 DIAGNOSIS — Z952 Presence of prosthetic heart valve: Secondary | ICD-10-CM

## 2018-04-23 DIAGNOSIS — R112 Nausea with vomiting, unspecified: Secondary | ICD-10-CM | POA: Diagnosis not present

## 2018-04-23 LAB — BASIC METABOLIC PANEL
Anion gap: 12 (ref 5–15)
BUN: 37 mg/dL — ABNORMAL HIGH (ref 8–23)
CALCIUM: 7.8 mg/dL — AB (ref 8.9–10.3)
CO2: 19 mmol/L — ABNORMAL LOW (ref 22–32)
CREATININE: 1.68 mg/dL — AB (ref 0.61–1.24)
Chloride: 107 mmol/L (ref 98–111)
GFR calc Af Amer: 41 mL/min — ABNORMAL LOW (ref >60)
GFR calc non Af Amer: 36 mL/min — ABNORMAL LOW (ref >60)
Glucose, Bld: 115 mg/dL — ABNORMAL HIGH (ref 70–99)
Potassium: 3.9 mmol/L (ref 3.5–5.1)
Sodium: 138 mmol/L (ref 135–145)

## 2018-04-23 LAB — CBC
HCT: 31.4 % — ABNORMAL LOW (ref 39.0–52.0)
Hemoglobin: 9.1 g/dL — ABNORMAL LOW (ref 13.0–17.0)
MCH: 26.4 pg (ref 26.0–34.0)
MCHC: 29 g/dL — ABNORMAL LOW (ref 30.0–36.0)
MCV: 91 fL (ref 80.0–100.0)
PLATELETS: 138 10*3/uL — AB (ref 150–400)
RBC: 3.45 MIL/uL — ABNORMAL LOW (ref 4.22–5.81)
RDW: 17.8 % — AB (ref 11.5–15.5)
WBC: 4.5 10*3/uL (ref 4.0–10.5)
nRBC: 0.9 % — ABNORMAL HIGH (ref 0.0–0.2)

## 2018-04-23 NOTE — Plan of Care (Signed)
?  Problem: Nutrition: ?Goal: Adequate nutrition will be maintained ?Outcome: Progressing ?  ?Problem: Coping: ?Goal: Level of anxiety will decrease ?Outcome: Progressing ?  ?Problem: Elimination: ?Goal: Will not experience complications related to urinary retention ?Outcome: Progressing ?  ?

## 2018-04-23 NOTE — Progress Notes (Addendum)
PROGRESS NOTE    Hunter Choi   LEX:[id number]  DOB: 1927/09/26  DOA: 04/22/2018 PCP: [name], MD   Brief Narrative:  Hunter Choi   is a 85 y.o. male with medical history significant of Rheumatoid arthritis on methotrexate and prednisone, stroke, GERD, AAA, BPH, hypothyroidism, coronary artery disease, chronic systolic heart failure with EF of 30%, paroxysmal atrial fibrillation, status post TAVR with recent hospitalization and discharge to a skilled nursing facility where he did very well comes back to the emergency room with sudden onset multiple episodes of loose watery stool and vomiting.     Subjective: Continues to vomit. No abdominal pain. He cannot tell me if he is having stools but has a rectal tube attached.     Assessment & Plan:   Principal Problem:   Nausea vomiting and diarrhea in an immunocompromised patient Fever 100.4 - No leukocytosis - treating as gastroenteritis- has rectal tube for diarrhea- I and O not being measures as ordered - downgrade to clears- PRN Zofran - abdominal exam is normal- not tender and not distended - xray show distended stomach but no other findings - C diff negative - check stool pathogen panel for viral infections- cont slow IVF  Active Problems: Chronic systolic CHF - EF 30%- Lasix on hold- receiving 50 cc/hr of NS due to above - urine output not measured last night  Thrombocytopenia  - acute possibly from viral infection    Rheumatoid arthritis  - on methotrexate  Hypothyroid - synthroid    Coronary artery disease involving coronary bypass graft of native heart with angina pectoris    S/P TAVR (transcatheter aortic valve replacement) - ASA, Plavix, statin, Coreg    Essential hypertension - Losartan on hold- cont Amlodipine     Time spent in minutes:  35 DVT prophylaxis: Lovenox Code Status: Full code Family Communication:  Disposition Plan: follow for diet tolerance Consultants:    none Procedures:   none Antimicrobials:  Anti-infectives (From admission, onward)   Start     Dose/Rate Route Frequency Ordered Stop   04/22/18 1530  cefTRIAXone (ROCEPHIN) 1 g in sodium chloride 0.9 % 100 mL IVPB     1 g 200 mL/hr over 30 Minutes Intravenous  Once 04/22/18 1523 04/22/18 1640       Objective: Vitals:   04/22/18 1600 04/22/18 1736 04/22/18 2103 04/23/18 0703  BP: (!) 109/57 (!) 101/54 110/60 (!) 116/52  Pulse: 71 75 83 69  Resp: 11  20 19   Temp:  (!) 100.4 F (38 C) (!) 100.4 F (38 C) 99.5 F (37.5 C)  TempSrc:  Oral Oral Oral  SpO2: 100% 95% 96% 97%    Intake/Output Summary (Last 24 hours) at 04/23/2018 1421 Last data filed at 04/23/2018 0600 Gross per 24 hour  Intake 2380 ml  Output -  Net 2380 ml   There were no vitals filed for this visit.  Examination: General exam: Appears comfortable  HEENT: PERRLA, oral mucosa moist, no sclera icterus or thrush- very hard of hearing Respiratory system: Clear to auscultation. Respiratory effort normal. Cardiovascular system: S1 & S2 heard, RRR.   Gastrointestinal system: Abdomen soft, non-tender, nondistended. Normal bowel sounds. Central nervous system: Alert and oriented. No focal neurological deficits. Extremities: No cyanosis, clubbing or edema Skin: No rashes or ulcers Psychiatry:  Mood & affect appropriate.     Data Reviewed: I have personally reviewed following labs and imaging studies  CBC: Recent Labs  Lab 04/22/18 1311 04/23/18 0250  WBC 5.2  4.5  NEUTROABS 3.8  --   HGB 9.4* 9.1*  HCT 31.8* 31.4*  MCV 90.1 91.0  PLT 152 138*   Basic Metabolic Panel: Recent Labs  Lab 04/22/18 1311 04/23/18 0250  NA 136 138  K 4.1 3.9  CL 102 107  CO2 23 19*  GLUCOSE 139* 115*  BUN 36* 37*  CREATININE 1.51* 1.68*  CALCIUM 8.3* 7.8*   GFR: CrCl cannot be calculated (Unknown ideal weight.). Liver Function Tests: Recent Labs  Lab 04/22/18 1311  AST 20  ALT 10  ALKPHOS 52  BILITOT 1.0   PROT 6.5  ALBUMIN 3.5   Recent Labs  Lab 04/22/18 1311  LIPASE 23   No results for input(s): AMMONIA in the last 168 hours. Coagulation Profile: No results for input(s): INR, PROTIME in the last 168 hours. Cardiac Enzymes: No results for input(s): CKTOTAL, CKMB, CKMBINDEX, TROPONINI in the last 168 hours. BNP (last 3 results) No results for input(s): PROBNP in the last 8760 hours. HbA1C: No results for input(s): HGBA1C in the last 72 hours. CBG: No results for input(s): GLUCAP in the last 168 hours. Lipid Profile: No results for input(s): CHOL, HDL, LDLCALC, TRIG, CHOLHDL, LDLDIRECT in the last 72 hours. Thyroid Function Tests: No results for input(s): TSH, T4TOTAL, FREET4, T3FREE, THYROIDAB in the last 72 hours. Anemia Panel: No results for input(s): VITAMINB12, FOLATE, FERRITIN, TIBC, IRON, RETICCTPCT in the last 72 hours. Urine analysis:    Component Value Date/Time   COLORURINE YELLOW 04/22/2018 1500   APPEARANCEUR HAZY (A) 04/22/2018 1500   LABSPEC 1.014 04/22/2018 1500   PHURINE 5.0 04/22/2018 1500   GLUCOSEU NEGATIVE 04/22/2018 1500   HGBUR MODERATE (A) 04/22/2018 1500   BILIRUBINUR NEGATIVE 04/22/2018 1500   KETONESUR NEGATIVE 04/22/2018 1500   PROTEINUR 30 (A) 04/22/2018 1500   UROBILINOGEN 4.0 (H) 11/14/2014 1849   NITRITE NEGATIVE 04/22/2018 1500   LEUKOCYTESUR MODERATE (A) 04/22/2018 1500   Sepsis Labs: @LABRCNTIP (procalcitonin:4,lacticidven:4) ) Recent Results (from the past 240 hour(s))  C difficile quick scan w PCR reflex     Status: None   Collection Time: 04/22/18  1:12 PM  Result Value Ref Range Status   C Diff antigen NEGATIVE NEGATIVE Final   C Diff toxin NEGATIVE NEGATIVE Final   C Diff interpretation No C. difficile detected.  Final         Radiology Studies: Dg Abd Portable 1v  Result Date: 04/23/2018 CLINICAL DATA:  Vomiting. EXAM: PORTABLE ABDOMEN - 1 VIEW COMPARISON:  CT chest, abdomen and pelvis 08/03/2017. FINDINGS: The stomach  is distended. Small and large bowel loops are unremarkable. Large rounded calcifications in the pelvis are consistent with distal abdominal and bilateral iliac artery aneurysms as seen on the prior CT. IMPRESSION: Distended stomach. Negative for bowel obstruction. Calcified abdominal aortic and bilateral iliac artery aneurysms. Electronically Signed   By: Drusilla Kanner M.D.   On: 04/23/2018 10:36      Scheduled Meds: . acetaminophen  1,000 mg Oral BID  . amLODipine  5 mg Oral Daily  . aspirin EC  81 mg Oral Daily  . atorvastatin  20 mg Oral Daily  . carvedilol  6.25 mg Oral BID WC  . clopidogrel  75 mg Oral Daily  . enoxaparin (LOVENOX) injection  30 mg Subcutaneous Q24H  . folic acid  3 mg Oral Daily  . levothyroxine  88 mcg Oral Daily  . multivitamin with minerals  1 tablet Oral Daily  . pantoprazole  40 mg Oral Daily  .  predniSONE  5 mg Oral Daily  . vitamin B-12  1,000 mcg Oral Daily   Continuous Infusions: . sodium chloride 50 mL/hr at 04/23/18 1222     LOS: 0 days      Calvert CantorSaima Valen Gillison, MD [facility] Pager: [email or web address] Password [email or web address] 04/23/2018, 2:21 PM

## 2018-04-23 NOTE — Care Management Note (Addendum)
Case Management Note  Patient Details  Name: Hunter Choi MRN: [id number] Date of Birth: 11/25/1927  Subjective/Objective:                    Action/Plan: Spoke to patient at bedside and son Hunter Choi via phone.   Patient from home with [facility]. Patient discharged from [facility] last week. Active with [facility] for PT/OT and want to continue.   Patient has walker, wheelchair, bedside commode and shower chair at home.   Currently son Hunter Choi is at home with same symptoms.   Will need resumption of care orders.  Tiffany with [facility] aware of patient admission.  Expected Discharge Date:                  Expected Discharge Plan:  Home w Home Health Services  In-House Referral:     Discharge planning Services  CM Consult  Post Acute Care Choice:  Home Health Choice offered to:  Patient, Adult Children  DME Arranged:  N/A DME Agency:  NA  HH Arranged:  PT, OT HH Agency:  [facility] (now [facility])  Status of Service:  In process, will continue to follow  If discussed at Long Length of Stay Meetings, dates discussed:    Additional Comments:  Kingsley Plan, RN 04/23/2018, 1:07 PM

## 2018-04-23 NOTE — Progress Notes (Signed)
Multiple watery stools through night, patient had brown watery emesis frequently, especially when reclined for bathing and changing linens. Paged provider for rectal pouch, order was placed.

## 2018-04-23 NOTE — Care Management Obs Status (Signed)
MEDICARE OBSERVATION STATUS NOTIFICATION   Patient Details  Name: THAXTON PARISO MRN: [id number] Date of Birth: [birth year]   Medicare Observation Status Notification Given:  Yes    Elliot Cousin, RN 04/23/2018, 3:37 PM

## 2018-04-24 ENCOUNTER — Observation Stay (HOSPITAL_COMMUNITY): Payer: Medicare Other

## 2018-04-24 DIAGNOSIS — I5042 Chronic combined systolic (congestive) and diastolic (congestive) heart failure: Secondary | ICD-10-CM | POA: Diagnosis present

## 2018-04-24 DIAGNOSIS — E039 Hypothyroidism, unspecified: Secondary | ICD-10-CM | POA: Diagnosis present

## 2018-04-24 DIAGNOSIS — D696 Thrombocytopenia, unspecified: Secondary | ICD-10-CM | POA: Diagnosis present

## 2018-04-24 DIAGNOSIS — H9190 Unspecified hearing loss, unspecified ear: Secondary | ICD-10-CM | POA: Diagnosis present

## 2018-04-24 DIAGNOSIS — N179 Acute kidney failure, unspecified: Secondary | ICD-10-CM | POA: Diagnosis present

## 2018-04-24 DIAGNOSIS — Z953 Presence of xenogenic heart valve: Secondary | ICD-10-CM | POA: Diagnosis not present

## 2018-04-24 DIAGNOSIS — Z8249 Family history of ischemic heart disease and other diseases of the circulatory system: Secondary | ICD-10-CM | POA: Diagnosis not present

## 2018-04-24 DIAGNOSIS — H5462 Unqualified visual loss, left eye, normal vision right eye: Secondary | ICD-10-CM | POA: Diagnosis present

## 2018-04-24 DIAGNOSIS — Z881 Allergy status to other antibiotic agents status: Secondary | ICD-10-CM | POA: Diagnosis not present

## 2018-04-24 DIAGNOSIS — Z8673 Personal history of transient ischemic attack (TIA), and cerebral infarction without residual deficits: Secondary | ICD-10-CM | POA: Diagnosis not present

## 2018-04-24 DIAGNOSIS — N400 Enlarged prostate without lower urinary tract symptoms: Secondary | ICD-10-CM | POA: Diagnosis present

## 2018-04-24 DIAGNOSIS — R05 Cough: Secondary | ICD-10-CM

## 2018-04-24 DIAGNOSIS — Z87891 Personal history of nicotine dependence: Secondary | ICD-10-CM | POA: Diagnosis not present

## 2018-04-24 DIAGNOSIS — I723 Aneurysm of iliac artery: Secondary | ICD-10-CM | POA: Diagnosis present

## 2018-04-24 DIAGNOSIS — Z7982 Long term (current) use of aspirin: Secondary | ICD-10-CM | POA: Diagnosis not present

## 2018-04-24 DIAGNOSIS — A0811 Acute gastroenteropathy due to Norwalk agent: Secondary | ICD-10-CM | POA: Diagnosis present

## 2018-04-24 DIAGNOSIS — I480 Paroxysmal atrial fibrillation: Secondary | ICD-10-CM | POA: Diagnosis present

## 2018-04-24 DIAGNOSIS — M069 Rheumatoid arthritis, unspecified: Secondary | ICD-10-CM | POA: Diagnosis present

## 2018-04-24 DIAGNOSIS — R112 Nausea with vomiting, unspecified: Secondary | ICD-10-CM | POA: Diagnosis not present

## 2018-04-24 DIAGNOSIS — E860 Dehydration: Secondary | ICD-10-CM | POA: Diagnosis present

## 2018-04-24 DIAGNOSIS — R197 Diarrhea, unspecified: Secondary | ICD-10-CM | POA: Diagnosis not present

## 2018-04-24 DIAGNOSIS — Z7902 Long term (current) use of antithrombotics/antiplatelets: Secondary | ICD-10-CM | POA: Diagnosis not present

## 2018-04-24 DIAGNOSIS — I25709 Atherosclerosis of coronary artery bypass graft(s), unspecified, with unspecified angina pectoris: Secondary | ICD-10-CM | POA: Diagnosis present

## 2018-04-24 DIAGNOSIS — K219 Gastro-esophageal reflux disease without esophagitis: Secondary | ICD-10-CM | POA: Diagnosis present

## 2018-04-24 DIAGNOSIS — I110 Hypertensive heart disease with heart failure: Secondary | ICD-10-CM | POA: Diagnosis present

## 2018-04-24 DIAGNOSIS — I10 Essential (primary) hypertension: Secondary | ICD-10-CM | POA: Diagnosis not present

## 2018-04-24 DIAGNOSIS — N3001 Acute cystitis with hematuria: Secondary | ICD-10-CM | POA: Diagnosis present

## 2018-04-24 DIAGNOSIS — I714 Abdominal aortic aneurysm, without rupture: Secondary | ICD-10-CM | POA: Diagnosis present

## 2018-04-24 LAB — GASTROINTESTINAL PANEL BY PCR, STOOL (REPLACES STOOL CULTURE)
Adenovirus F40/41: NOT DETECTED
Astrovirus: NOT DETECTED
CYCLOSPORA CAYETANENSIS: NOT DETECTED
Campylobacter species: NOT DETECTED
Cryptosporidium: NOT DETECTED
Entamoeba histolytica: NOT DETECTED
Enteroaggregative E coli (EAEC): NOT DETECTED
Enteropathogenic E coli (EPEC): NOT DETECTED
Enterotoxigenic E coli (ETEC): NOT DETECTED
Giardia lamblia: NOT DETECTED
Norovirus GI/GII: DETECTED — AB
Plesimonas shigelloides: NOT DETECTED
Rotavirus A: NOT DETECTED
Salmonella species: NOT DETECTED
Sapovirus (I, II, IV, and V): NOT DETECTED
Shiga like toxin producing E coli (STEC): NOT DETECTED
Shigella/Enteroinvasive E coli (EIEC): NOT DETECTED
Vibrio cholerae: NOT DETECTED
Vibrio species: NOT DETECTED
Yersinia enterocolitica: NOT DETECTED

## 2018-04-24 LAB — RESPIRATORY PANEL BY PCR

## 2018-04-24 LAB — BASIC METABOLIC PANEL
Anion gap: 9 (ref 5–15)
BUN: 35 mg/dL — ABNORMAL HIGH (ref 8–23)
CO2: 18 mmol/L — ABNORMAL LOW (ref 22–32)
Calcium: 8.3 mg/dL — ABNORMAL LOW (ref 8.9–10.3)
Chloride: 116 mmol/L — ABNORMAL HIGH (ref 98–111)
Creatinine, Ser: 1.82 mg/dL — ABNORMAL HIGH (ref 0.61–1.24)
GFR calc Af Amer: 38 mL/min — ABNORMAL LOW (ref >60)
GFR, EST NON AFRICAN AMERICAN: 32 mL/min — AB (ref >60)
Glucose, Bld: 112 mg/dL — ABNORMAL HIGH (ref 70–99)
Potassium: 3.2 mmol/L — ABNORMAL LOW (ref 3.5–5.1)
Sodium: 143 mmol/L (ref 135–145)

## 2018-04-24 LAB — CBC
HCT: 32.3 % — ABNORMAL LOW (ref 39.0–52.0)
Hemoglobin: 9 g/dL — ABNORMAL LOW (ref 13.0–17.0)
MCH: 26 pg (ref 26.0–34.0)
MCHC: 27.9 g/dL — ABNORMAL LOW (ref 30.0–36.0)
MCV: 93.4 fL (ref 80.0–100.0)
Platelets: 138 10*3/uL — ABNORMAL LOW (ref 150–400)
RBC: 3.46 MIL/uL — ABNORMAL LOW (ref 4.22–5.81)
RDW: 18.2 % — ABNORMAL HIGH (ref 11.5–15.5)
WBC: 4.8 10*3/uL (ref 4.0–10.5)
nRBC: 0.6 % — ABNORMAL HIGH (ref 0.0–0.2)

## 2018-04-24 LAB — MAGNESIUM: MAGNESIUM: 2 mg/dL (ref 1.7–2.4)

## 2018-04-24 MED ORDER — DEXTROMETHORPHAN POLISTIREX ER 30 MG/5ML PO SUER
15.0000 mg | Freq: Two times a day (BID) | ORAL | Status: DC
  Administered 2018-04-24 – 2018-04-30 (×12): 15 mg via ORAL
  Filled 2018-04-24 (×14): qty 5

## 2018-04-24 MED ORDER — POTASSIUM CHLORIDE CRYS ER 20 MEQ PO TBCR
40.0000 meq | EXTENDED_RELEASE_TABLET | ORAL | Status: AC
  Administered 2018-04-24 (×2): 40 meq via ORAL
  Filled 2018-04-24 (×2): qty 2

## 2018-04-24 MED ORDER — SODIUM CHLORIDE 0.9 % IV BOLUS
500.0000 mL | Freq: Once | INTRAVENOUS | Status: AC
  Administered 2018-04-24: 500 mL via INTRAVENOUS

## 2018-04-24 MED ORDER — LORATADINE 10 MG PO TABS
10.0000 mg | ORAL_TABLET | Freq: Every day | ORAL | Status: DC
  Administered 2018-04-24 – 2018-04-30 (×7): 10 mg via ORAL
  Filled 2018-04-24 (×7): qty 1

## 2018-04-24 NOTE — Progress Notes (Signed)
PT Cancellation Note  Patient Details Name: Hunter Choi MRN: [id number] DOB: 1927/07/22   Cancelled Treatment:    Reason Eval/Treat Not Completed: Patient at procedure or test/unavailable   Second attempt; pt off the floor;   Will retry for PT eval tomorrow;   Van Clines, PT  Acute Rehabilitation Services Pager [phone] Office [phone]    Hunter Choi 04/24/2018, 2:51 PM

## 2018-04-24 NOTE — Progress Notes (Signed)
PROGRESS NOTE    Hunter Choi   ZOX:[id number]  DOB: [birth year]  DOA: 04/22/2018 PCP: [name], MD   Brief Narrative:  Hunter Choi   is a 85 y.o. male with medical history significant of Rheumatoid arthritis on methotrexate and prednisone, stroke, GERD, AAA, BPH, hypothyroidism, coronary artery disease, chronic systolic heart failure with EF of 30%, paroxysmal atrial fibrillation, status post TAVR with recent hospitalization and discharge to a skilled nursing facility where he did very well comes back to the emergency room with sudden onset multiple episodes of loose watery stool and vomiting.     Subjective: Per RN, he continue to have diarrhea. He had a very large watery BM last night in the bed. He continues to have a severe cough on my exam and cannot tell me when it began. I have called his son for further information- apparently they both have had a cough for a few days.     Assessment & Plan:   Principal Problem:   Nausea vomiting and diarrhea in an immunocompromised patient Fever 100.4 - No leukocytosis - treating as gastroenteritis- has rectal tube for profuse diarrhea but stool last night was all in the bed as well-  Otherwise, stool output is documented as 1700 cc for yesterday-  - Advance to solids today as he is not vomiting any more - PRN Zofran - abdominal exam is normal- not tender and not distended - xray show distended stomach but no other findings - C diff negative - Gi pathogen panel + for Norovirus today  - due to only ~ 500 cc of urine output yesterday and rising CR, I have given him a NS bolus of 500 cc-  Cont NS at 75 cc/hr  Active Problems: Chronic systolic CHF - EF 30%- Lasix on hold- receiving 50 cc/hr of NS due to above - following careful I and O - CXR  today ordered for his cough - per report, he has pulm edema but I do not feel this is pulmonary edema- lung clear- pulse ox 97% - place on continuous pulse ox and on telemetry and  follow closely for fluid overload  AKI - Cr 1.51 >> 1.82- see above in regard to IVF  Acute Cough - CXR > no focal infiltrate fever or leukocytosis- start Delsym and Claritin - check resp virus panel  Thrombocytopenia  - acute - possibly from viral infection    Rheumatoid arthritis  - on methotrexate  Hypothyroid - synthroid    Coronary artery disease involving coronary bypass graft of native heart with angina pectoris    S/P TAVR (transcatheter aortic valve replacement) - ASA, Plavix, statin, Coreg    Essential hypertension - Losartan on hold- cont Amlodipine     Time spent in minutes:  > 35 min DVT prophylaxis: Lovenox Code Status: Full code Family Communication: son, Clarkson Rosselli Disposition Plan: follow for diet tolerance Consultants:   none Procedures:   none Antimicrobials:  Anti-infectives (From admission, onward)   Start     Dose/Rate Route Frequency Ordered Stop   04/22/18 1530  cefTRIAXone (ROCEPHIN) 1 g in sodium chloride 0.9 % 100 mL IVPB     1 g 200 mL/hr over 30 Minutes Intravenous  Once 04/22/18 1523 04/22/18 1640       Objective: Vitals:   04/24/18 0538 04/24/18 1048 04/24/18 1300 04/24/18 1503  BP: 119/60 127/62 107/60 108/63  Pulse: 78 66 62 62  Resp: Temp: 98.2 F (36.8 C)  98.4 F (36.9 C) 98.5 F (36.9 C)  TempSrc: Oral  Oral Oral  SpO2: 99%  100% 96%  Weight:        Intake/Output Summary (Last 24 hours) at 04/24/2018 1651 Last data filed at 04/24/2018 0539 Gross per 24 hour  Intake 780 ml  Output 1750 ml  Net -970 ml   Filed Weights   04/24/18 0500  Weight: 67.2 kg    Examination: General exam: Appears comfortable  HEENT: PERRLA, oral mucosa moist, no sclera icterus or thrush- very hard of hearing Respiratory system: Clear to auscultation. Respiratory effort normal. Has a severe cough Cardiovascular system: S1 & S2 heard, RRR.   Gastrointestinal system: Abdomen soft, non-tender, nondistended. Normal bowel  sounds.- rectal bag with liquid stool Central nervous system: Alert and oriented to month, place and situation- cannot remember year. No focal neurological deficits. Extremities: No cyanosis, clubbing or edema Skin: No rashes or ulcers Psychiatry:  Mood & affect appropriate.     Data Reviewed: I have personally reviewed following labs and imaging studies  CBC: Recent Labs  Lab 04/22/18 1311 04/23/18 0250 04/24/18 0803  WBC 5.2 4.5 4.8  NEUTROABS 3.8  --   --   HGB 9.4* 9.1* 9.0*  HCT 31.8* 31.4* 32.3*  MCV 90.1 91.0 93.4  PLT 152 138* 138*   Basic Metabolic Panel: Recent Labs  Lab 04/22/18 1311 04/23/18 0250 04/24/18 0803  NA 136 138 143  K 4.1 3.9 3.2*  CL 102 107 116*  CO2 23 19* 18*  GLUCOSE 139* 115* 112*  BUN 36* 37* 35*  CREATININE 1.51* 1.68* 1.82*  CALCIUM 8.3* 7.8* 8.3*  MG  --   --  2.0   GFR: Estimated Creatinine Clearance: 26.2 mL/min (A) (by C-G formula based on SCr of 1.82 mg/dL (H)). Liver Function Tests: Recent Labs  Lab 04/22/18 1311  AST 20  ALT 10  ALKPHOS 52  BILITOT 1.0  PROT 6.5  ALBUMIN 3.5   Recent Labs  Lab 04/22/18 1311  LIPASE 23   No results for input(s): AMMONIA in the last 168 hours. Coagulation Profile: No results for input(s): INR, PROTIME in the last 168 hours. Cardiac Enzymes: No results for input(s): CKTOTAL, CKMB, CKMBINDEX, TROPONINI in the last 168 hours. BNP (last 3 results) No results for input(s): PROBNP in the last 8760 hours. HbA1C: No results for input(s): HGBA1C in the last 72 hours. CBG: No results for input(s): GLUCAP in the last 168 hours. Lipid Profile: No results for input(s): CHOL, HDL, LDLCALC, TRIG, CHOLHDL, LDLDIRECT in the last 72 hours. Thyroid Function Tests: No results for input(s): TSH, T4TOTAL, FREET4, T3FREE, THYROIDAB in the last 72 hours. Anemia Panel: No results for input(s): VITAMINB12, FOLATE, FERRITIN, TIBC, IRON, RETICCTPCT in the last 72 hours. Urine analysis:    Component  Value Date/Time   COLORURINE YELLOW 04/22/2018 1500   APPEARANCEUR HAZY (A) 04/22/2018 1500   LABSPEC 1.014 04/22/2018 1500   PHURINE 5.0 04/22/2018 1500   GLUCOSEU NEGATIVE 04/22/2018 1500   HGBUR MODERATE (A) 04/22/2018 1500   BILIRUBINUR NEGATIVE 04/22/2018 1500   KETONESUR NEGATIVE 04/22/2018 1500   PROTEINUR 30 (A) 04/22/2018 1500   UROBILINOGEN 4.0 (H) 11/14/2014 1849   NITRITE NEGATIVE 04/22/2018 1500   LEUKOCYTESUR MODERATE (A) 04/22/2018 1500   Sepsis Labs: @LABRCNTIP (procalcitonin:4,lacticidven:4) ) Recent Results (from the past 240 hour(s))  C difficile quick scan w PCR reflex     Status: None   Collection Time: 04/22/18  1:12 PM  Result Value Ref Range  Status   C Diff antigen NEGATIVE NEGATIVE Final   C Diff toxin NEGATIVE NEGATIVE Final   C Diff interpretation No C. difficile detected.  Final  Urine culture     Status: None (Preliminary result)   Collection Time: 04/22/18 11:50 PM  Result Value Ref Range Status   Specimen Description URINE, CLEAN CATCH  Final   Special Requests NONE  Final   Culture   Final    CULTURE REINCUBATED FOR BETTER GROWTH Performed at [facility], [address] [place], [place] [address]    Report Status PENDING  Incomplete  Gastrointestinal Panel by PCR , Stool     Status: Abnormal   Collection Time: 04/23/18 10:32 PM  Result Value Ref Range Status   Campylobacter species NOT DETECTED NOT DETECTED Final   Plesimonas shigelloides NOT DETECTED NOT DETECTED Final   Salmonella species NOT DETECTED NOT DETECTED Final   Yersinia enterocolitica NOT DETECTED NOT DETECTED Final   Vibrio species NOT DETECTED NOT DETECTED Final   Vibrio cholerae NOT DETECTED NOT DETECTED Final   Enteroaggregative E coli (EAEC) NOT DETECTED NOT DETECTED Final   Enteropathogenic E coli (EPEC) NOT DETECTED NOT DETECTED Final   Enterotoxigenic E coli (ETEC) NOT DETECTED NOT DETECTED Final   Shiga like toxin producing E coli (STEC) NOT DETECTED NOT  DETECTED Final   Shigella/Enteroinvasive E coli (EIEC) NOT DETECTED NOT DETECTED Final   Cryptosporidium NOT DETECTED NOT DETECTED Final   Cyclospora cayetanensis NOT DETECTED NOT DETECTED Final   Entamoeba histolytica NOT DETECTED NOT DETECTED Final   Giardia lamblia NOT DETECTED NOT DETECTED Final   Adenovirus F40/41 NOT DETECTED NOT DETECTED Final   Astrovirus NOT DETECTED NOT DETECTED Final   Norovirus GI/GII DETECTED (A) NOT DETECTED Final    Comment: RESULT CALLED TO, READ BACK BY AND VERIFIED WITH:  NADIA HARVEY AT 1105 04/24/2018 SDR    Rotavirus A NOT DETECTED NOT DETECTED Final   Sapovirus (I, II, IV, and V) NOT DETECTED NOT DETECTED Final    Comment: Performed at [facility], [address], [place], [place] [address]         Radiology Studies: Dg Chest 2 View  Result Date: 04/24/2018 CLINICAL DATA:  Shortness of breath, cough. EXAM: CHEST - 2 VIEW COMPARISON:  Radiographs of December 09, 2017. FINDINGS: Stable cardiomegaly with central pulmonary vascular congestion. Atherosclerosis of thoracic aorta is noted. Status post coronary artery bypass graft and aortic valve repair. No pneumothorax is noted. No significant pleural effusion is noted. Some degree of pulmonary edema can not be excluded. Bony thorax is unremarkable. IMPRESSION: Stable cardiomegaly with central pulmonary vascular congestion and possible pulmonary edema. Aortic Atherosclerosis (ICD10-I70.0). Electronically Signed   By: Lupita Raider, M.D.   On: 04/24/2018 11:18   Dg Abd Portable 1v  Result Date: 04/23/2018 CLINICAL DATA:  Vomiting. EXAM: PORTABLE ABDOMEN - 1 VIEW COMPARISON:  CT chest, abdomen and pelvis 08/03/2017. FINDINGS: The stomach is distended. Small and large bowel loops are unremarkable. Large rounded calcifications in the pelvis are consistent with distal abdominal and bilateral iliac artery aneurysms as seen on the prior CT. IMPRESSION: Distended stomach. Negative for bowel  obstruction. Calcified abdominal aortic and bilateral iliac artery aneurysms. Electronically Signed   By: Drusilla Kanner M.D.   On: 04/23/2018 10:36      Scheduled Meds: . acetaminophen  1,000 mg Oral BID  . amLODipine  5 mg Oral Daily  . aspirin EC  81 mg Oral Daily  .  atorvastatin  20 mg Oral Daily  . carvedilol  6.25 mg Oral BID WC  . clopidogrel  75 mg Oral Daily  . dextromethorphan  15 mg Oral BID  . enoxaparin (LOVENOX) injection  30 mg Subcutaneous Q24H  . folic acid  3 mg Oral Daily  . levothyroxine  88 mcg Oral Daily  . multivitamin with minerals  1 tablet Oral Daily  . pantoprazole  40 mg Oral Daily  . predniSONE  5 mg Oral Daily  . vitamin B-12  1,000 mcg Oral Daily   Continuous Infusions: . sodium chloride 50 mL/hr at 04/23/18 1222     LOS: 0 days      Calvert CantorSaima Nadia Viar, MD [facility] Pager: [email or web address] Password [email or web address] 04/24/2018, 4:51 PM

## 2018-04-24 NOTE — Progress Notes (Signed)
Pt arrived to the unit. Settled in room pt looking for earring aides unable tyo find at bedside, called 6N unable to locate. Only belongings brought with pt was socks placed in closet.

## 2018-04-24 NOTE — Plan of Care (Signed)
  Problem: Health Behavior/Discharge Planning: Goal: Ability to manage health-related needs will improve Outcome: Progressing   Problem: Clinical Measurements: Goal: Will remain free from infection Outcome: Progressing   Problem: Nutrition: Goal: Adequate nutrition will be maintained Outcome: Progressing   Problem: Coping: Goal: Level of anxiety will decrease Outcome: Progressing   Problem: Pain Managment: Goal: General experience of comfort will improve Outcome: Progressing   Problem: Safety: Goal: Ability to remain free from injury will improve Outcome: Progressing   Problem: Skin Integrity: Goal: Risk for impaired skin integrity will decrease Outcome: Progressing

## 2018-04-24 NOTE — Progress Notes (Signed)
PT Cancellation Note  Patient Details Name: Hunter Choi MRN: [id number] DOB: 1927-05-18   Cancelled Treatment:    Reason Eval/Treat Not Completed: Patient at procedure or test/unavailable   Pt off the floor at initial attempt for PT eval;   Will follow,  Van Clines, PT  Acute Rehabilitation Services Pager [phone] Office [phone]    Levi Aland 04/24/2018, 11:26 AM

## 2018-04-25 LAB — CBC
HCT: 28.4 % — ABNORMAL LOW (ref 39.0–52.0)
Hemoglobin: 8.4 g/dL — ABNORMAL LOW (ref 13.0–17.0)
MCH: 27.5 pg (ref 26.0–34.0)
MCHC: 29.6 g/dL — ABNORMAL LOW (ref 30.0–36.0)
MCV: 92.8 fL (ref 80.0–100.0)
Platelets: 114 10*3/uL — ABNORMAL LOW (ref 150–400)
RBC: 3.06 MIL/uL — ABNORMAL LOW (ref 4.22–5.81)
RDW: 18.4 % — ABNORMAL HIGH (ref 11.5–15.5)
WBC: 6.6 10*3/uL (ref 4.0–10.5)
nRBC: 0.9 % — ABNORMAL HIGH (ref 0.0–0.2)

## 2018-04-25 LAB — BASIC METABOLIC PANEL
Anion gap: 7 (ref 5–15)
BUN: 33 mg/dL — ABNORMAL HIGH (ref 8–23)
CALCIUM: 8.1 mg/dL — AB (ref 8.9–10.3)
CO2: 18 mmol/L — ABNORMAL LOW (ref 22–32)
Chloride: 115 mmol/L — ABNORMAL HIGH (ref 98–111)
Creatinine, Ser: 1.86 mg/dL — ABNORMAL HIGH (ref 0.61–1.24)
GFR calc Af Amer: 37 mL/min — ABNORMAL LOW (ref >60)
GFR calc non Af Amer: 32 mL/min — ABNORMAL LOW (ref >60)
Glucose, Bld: 91 mg/dL (ref 70–99)
Potassium: 4.3 mmol/L (ref 3.5–5.1)
Sodium: 140 mmol/L (ref 135–145)

## 2018-04-25 NOTE — Progress Notes (Signed)
PROGRESS NOTE    Hunter Choi  ZHY:[id number] DOB: 06-Feb-1928 DOA: 04/22/2018 PCP: Lorenda IshiharaVaradarajan, [name], MD   Brief Narrative: Patient is 85 year old male with past medical history of rheumatoid arthritis on methotrexate and prednisone, stroke, GERD, abdominal aortic aneurysm, BPH, hypothyroidism, coronary artery disease, chronic systolic heart failure with EF of 30%, paroxysmal A. fib, status post TAVR was recently hospitalized and was discharged to skilled facility but came back with complaints of multiple episodes of diarrhea, vomiting.  GI pathogen panel showed  NOROVIRUS.  Currently on conservative management.  Assessment & Plan:   Principal Problem:   Nausea vomiting and diarrhea Active Problems:   Rheumatoid arthritis (HCC)   GERD (gastroesophageal reflux disease)   Thyroid disease   Coronary artery disease involving coronary bypass graft of native heart with angina pectoris (HCC)   S/P TAVR (transcatheter aortic valve replacement)   Essential hypertension   Hypothyroidism  Norovirus diarhoea: Presented with loose stools, nausea, vomiting.  Had a fever of 100.4 on presentation.  No leukocytosis.  Has rectal tube with profuse diarrhea.  Had output of 400 mL since last night till this morning. Denies any abdominal pain.  Nausea and vomiting have resolved.  Continue hydration.  Chronic systolic heart failure: Ejection fraction of 30%.  Was on Lasix which is on hold.  Continue to monitor input and output.  Acute kidney injury: Baseline creatinine around 1.5.  Creatinine trended up to 1.8.  From GI loss.  Continue IV fluids.  Will be careful with IV fluids since he has heart failure.  Cough: Improved.  Chest x-ray did not show any pneumonia.  Thrombocytopenia: We will discontinue Lovenox.  Also on aspirin and Plavix from home.  Continue to monitor  History of rheumatoid arthritis: Methotrexate and prednisone.  Hypothyroidism: Continue Synthyroid  History of coronary  disease: Status post CABG, status post TAVR .  On aspirin, Plavix, statin and Coreg  Hypertension: Losartan on hold.  Continue amlodipine.  Continue blood pressure monitoring.  Currently stable          DVT prophylaxis:SCD Code Status: Full Family Communication: None present at the bedside Disposition Plan: Back to skilled facility after resolution of diarrhea   Consultants: None  Procedures: None  Antimicrobials:  Anti-infectives (From admission, onward)   Start     Dose/Rate Route Frequency Ordered Stop   04/22/18 1530  cefTRIAXone (ROCEPHIN) 1 g in sodium chloride 0.9 % 100 mL IVPB     1 g 200 mL/hr over 30 Minutes Intravenous  Once 04/22/18 1523 04/22/18 1640      Subjective: Patient seen and examined the bedside this morning.  Remains comfortable.  Hemodynamically stable.  Denies any abdominal pain, nausea or vomiting.  Still has significant output from the rectal tube.  Objective: Vitals:   04/24/18 1300 04/24/18 1503 04/24/18 2051 04/25/18 0342  BP: 107/60 108/63 107/67 111/60  Pulse: 62 62 79 62  Resp: 18 19 18 18   Temp: 98.4 F (36.9 C) 98.5 F (36.9 C) 98.9 F (37.2 C) 98 F (36.7 C)  TempSrc: Oral Oral Oral Oral  SpO2: 100% 96% 97% 96%  Weight:    68.4 kg    Intake/Output Summary (Last 24 hours) at 04/25/2018 0940 Last data filed at 04/25/2018 0342 Gross per 24 hour  Intake -  Output 700 ml  Net -700 ml   Filed Weights   04/24/18 0500 04/25/18 0342  Weight: 67.2 kg 68.4 kg    Examination:  General exam: Debilitated elderly male  HEENT:PERRL,Oral mucosa  moist, Ear/Nose normal on gross exam Respiratory system: Bilateral decreased air entry in the bases, bilateral mild basal crackles Cardiovascular system: S1 & S2 heard, RRR. No JVD, murmurs, rubs, gallops or clicks. No pedal edema. Gastrointestinal system: Abdomen is nondistended, soft and nontender. No organomegaly or masses felt. Normal bowel sounds heard. Central nervous system: Alert and  oriented. No focal neurological deficits. Extremities: No edema, no clubbing ,no cyanosis, distal peripheral pulses palpable. Skin: No rashes, lesions or ulcers,no icterus ,no pallor MSK: Normal muscle bulk,tone ,power Psychiatry: Judgement and insight appear normal. Mood & affect appropriate.     Data Reviewed: I have personally reviewed following labs and imaging studies  CBC: Recent Labs  Lab 04/22/18 1311 04/23/18 0250 04/24/18 0803 04/25/18 0704  WBC 5.2 4.5 4.8 6.6  NEUTROABS 3.8  --   --   --   HGB 9.4* 9.1* 9.0* 8.4*  HCT 31.8* 31.4* 32.3* 28.4*  MCV 90.1 91.0 93.4 92.8  PLT 152 138* 138* 114*   Basic Metabolic Panel: Recent Labs  Lab 04/22/18 1311 04/23/18 0250 04/24/18 0803 04/25/18 0704  NA 136 138 143 140  K 4.1 3.9 3.2* 4.3  CL 102 107 116* 115*  CO2 23 19* 18* 18*  GLUCOSE 139* 115* 112* 91  BUN 36* 37* 35* 33*  CREATININE 1.51* 1.68* 1.82* 1.86*  CALCIUM 8.3* 7.8* 8.3* 8.1*  MG  --   --  2.0  --    GFR: Estimated Creatinine Clearance: 25.7 mL/min (A) (by C-G formula based on SCr of 1.86 mg/dL (H)). Liver Function Tests: Recent Labs  Lab 04/22/18 1311  AST 20  ALT 10  ALKPHOS 52  BILITOT 1.0  PROT 6.5  ALBUMIN 3.5   Recent Labs  Lab 04/22/18 1311  LIPASE 23   No results for input(s): AMMONIA in the last 168 hours. Coagulation Profile: No results for input(s): INR, PROTIME in the last 168 hours. Cardiac Enzymes: No results for input(s): CKTOTAL, CKMB, CKMBINDEX, TROPONINI in the last 168 hours. BNP (last 3 results) No results for input(s): PROBNP in the last 8760 hours. HbA1C: No results for input(s): HGBA1C in the last 72 hours. CBG: No results for input(s): GLUCAP in the last 168 hours. Lipid Profile: No results for input(s): CHOL, HDL, LDLCALC, TRIG, CHOLHDL, LDLDIRECT in the last 72 hours. Thyroid Function Tests: No results for input(s): TSH, T4TOTAL, FREET4, T3FREE, THYROIDAB in the last 72 hours. Anemia Panel: No results  for input(s): VITAMINB12, FOLATE, FERRITIN, TIBC, IRON, RETICCTPCT in the last 72 hours. Sepsis Labs: No results for input(s): PROCALCITON, LATICACIDVEN in the last 168 hours.  Recent Results (from the past 240 hour(s))  C difficile quick scan w PCR reflex     Status: None   Collection Time: 04/22/18  1:12 PM  Result Value Ref Range Status   C Diff antigen NEGATIVE NEGATIVE Final   C Diff toxin NEGATIVE NEGATIVE Final   C Diff interpretation No C. difficile detected.  Final  Urine culture     Status: None (Preliminary result)   Collection Time: 04/22/18 11:50 PM  Result Value Ref Range Status   Specimen Description URINE, CLEAN CATCH  Final   Special Requests NONE  Final   Culture   Final    CULTURE REINCUBATED FOR BETTER GROWTH Performed at [facility], [address] [place], [place] [address]    Report Status PENDING  Incomplete  Gastrointestinal Panel by PCR , Stool     Status: Abnormal   Collection Time:  04/23/18 10:32 PM  Result Value Ref Range Status   Campylobacter species NOT DETECTED NOT DETECTED Final   Plesimonas shigelloides NOT DETECTED NOT DETECTED Final   Salmonella species NOT DETECTED NOT DETECTED Final   Yersinia enterocolitica NOT DETECTED NOT DETECTED Final   Vibrio species NOT DETECTED NOT DETECTED Final   Vibrio cholerae NOT DETECTED NOT DETECTED Final   Enteroaggregative E coli (EAEC) NOT DETECTED NOT DETECTED Final   Enteropathogenic E coli (EPEC) NOT DETECTED NOT DETECTED Final   Enterotoxigenic E coli (ETEC) NOT DETECTED NOT DETECTED Final   Shiga like toxin producing E coli (STEC) NOT DETECTED NOT DETECTED Final   Shigella/Enteroinvasive E coli (EIEC) NOT DETECTED NOT DETECTED Final   Cryptosporidium NOT DETECTED NOT DETECTED Final   Cyclospora cayetanensis NOT DETECTED NOT DETECTED Final   Entamoeba histolytica NOT DETECTED NOT DETECTED Final   Giardia lamblia NOT DETECTED NOT DETECTED Final   Adenovirus F40/41 NOT DETECTED NOT DETECTED  Final   Astrovirus NOT DETECTED NOT DETECTED Final   Norovirus GI/GII DETECTED (A) NOT DETECTED Final    Comment: RESULT CALLED TO, READ BACK BY AND VERIFIED WITH:  NADIA HARVEY AT 1105 04/24/2018 SDR    Rotavirus A NOT DETECTED NOT DETECTED Final   Sapovirus (I, II, IV, and V) NOT DETECTED NOT DETECTED Final    Comment: Performed at [facility], [address] [place], [place] [address]  Respiratory Panel by PCR     Status: None   Collection Time: 04/24/18 12:47 PM  Result Value Ref Range Status   Adenovirus NOT DETECTED NOT DETECTED Final   Coronavirus 229E NOT DETECTED NOT DETECTED Final   Coronavirus HKU1 NOT DETECTED NOT DETECTED Final   Coronavirus NL63 NOT DETECTED NOT DETECTED Final   Coronavirus OC43 NOT DETECTED NOT DETECTED Final   Metapneumovirus NOT DETECTED NOT DETECTED Final   Rhinovirus / Enterovirus NOT DETECTED NOT DETECTED Final   Influenza A NOT DETECTED NOT DETECTED Final   Influenza B NOT DETECTED NOT DETECTED Final   Parainfluenza Virus 1 NOT DETECTED NOT DETECTED Final   Parainfluenza Virus 2 NOT DETECTED NOT DETECTED Final   Parainfluenza Virus 3 NOT DETECTED NOT DETECTED Final   Parainfluenza Virus 4 NOT DETECTED NOT DETECTED Final   Respiratory Syncytial Virus NOT DETECTED NOT DETECTED Final   Bordetella pertussis NOT DETECTED NOT DETECTED Final   Chlamydophila pneumoniae NOT DETECTED NOT DETECTED Final   Mycoplasma pneumoniae NOT DETECTED NOT DETECTED Final    Comment: Performed at [facility], [address] [place], [place] [address]         Radiology Studies: Dg Chest 2 View  Result Date: 04/24/2018 CLINICAL DATA:  Shortness of breath, cough. EXAM: CHEST - 2 VIEW COMPARISON:  Radiographs of December 09, 2017. FINDINGS: Stable cardiomegaly with central pulmonary vascular congestion. Atherosclerosis of thoracic aorta is noted. Status post coronary artery bypass graft and aortic valve repair. No pneumothorax is noted. No  significant pleural effusion is noted. Some degree of pulmonary edema can not be excluded. Bony thorax is unremarkable. IMPRESSION: Stable cardiomegaly with central pulmonary vascular congestion and possible pulmonary edema. Aortic Atherosclerosis (ICD10-I70.0). Electronically Signed   By: Lupita Raider, M.D.   On: 04/24/2018 11:18   Dg Abd Portable 1v  Result Date: 04/23/2018 CLINICAL DATA:  Vomiting. EXAM: PORTABLE ABDOMEN - 1 VIEW COMPARISON:  CT chest, abdomen and pelvis 08/03/2017. FINDINGS: The stomach is distended. Small and large bowel loops are unremarkable. Large rounded calcifications in the  pelvis are consistent with distal abdominal and bilateral iliac artery aneurysms as seen on the prior CT. IMPRESSION: Distended stomach. Negative for bowel obstruction. Calcified abdominal aortic and bilateral iliac artery aneurysms. Electronically Signed   By: Thomas  Dalessio M.D.   On: 04/23/2018 10:36        Scheduled Meds: Drusilla Kanner. acetaminophen  1,000 mg Oral BID  . amLODipine  5 mg Oral Daily  . aspirin EC  81 mg Oral Daily  . atorvastatin  20 mg Oral Daily  . carvedilol  6.25 mg Oral BID WC  . clopidogrel  75 mg Oral Daily  . dextromethorphan  15 mg Oral BID  . folic acid  3 mg Oral Daily  . levothyroxine  88 mcg Oral Daily  . loratadine  10 mg Oral Daily  . multivitamin with minerals  1 tablet Oral Daily  . pantoprazole  40 mg Oral Daily  . predniSONE  5 mg Oral Daily  . vitamin B-12  1,000 mcg Oral Daily   Continuous Infusions: . sodium chloride 75 mL/hr at 04/25/18 0733     LOS: 1 day    Time spent: 35 mins.More than 50% of that time was spent in counseling and/or coordination of care.      Burnadette PopAmrit Alfonsa Vaile, MD [facility] Pager [phone]  If 7PM-7AM, please contact night-coverage [email or web address] Password [email or web address] 04/25/2018, 9:40 AM

## 2018-04-25 NOTE — Evaluation (Signed)
Physical Therapy Evaluation Patient Details Name: Hunter Choi MRN: [id number] DOB: [birth year] Today's Date: 04/25/2018   History of Present Illness  Patient is 85 year old male with past medical history of rheumatoid arthritis on methotrexate and prednisone, stroke, GERD, abdominal aortic aneurysm, BPH, hypothyroidism, coronary artery disease, chronic systolic heart failure with EF of 30%, paroxysmal A. fib, status post TAVR was recently hospitalized and was discharged to skilled facility but came back with complaints of multiple episodes of diarrhea, vomiting.  GI pathogen panel showed  NOROVIRUS.  Clinical Impression  Pt admitted with above diagnosis. Pt currently with functional limitations due to the deficits listed below (see PT Problem List). Pt was able to stand and pivot to chair with min guard to min assist.  Pt overall with good safety for transfers and states son will assist x 24 hours at home. If 24 hour care, home with HH.  If not pt will need SNF.   Pt will benefit from skilled PT to increase their independence and safety with mobility to allow discharge to the venue listed below.      Follow Up Recommendations Home health PT;Supervision/Assistance - 24 hour    Equipment Recommendations  None recommended by PT    Recommendations for Other Services       Precautions / Restrictions Precautions Precautions: Fall(Cdiff) Precaution Comments: flexiseal, foley Restrictions Weight Bearing Restrictions: No      Mobility  Bed Mobility Overal bed mobility: Independent                Transfers Overall transfer level: Needs assistance Equipment used: None Transfers: Squat Pivot Transfers;Sit to/from Stand Sit to Stand: Min assist   Squat pivot transfers: Min guard;Min assist     General transfer comment: Pivoted to chair and back with min guard assist.  OVerall good technique.  Very fatigued at end of transfer.   Ambulation/Gait             General Gait  Details: NT  Stairs            Wheelchair Mobility    Modified Rankin (Stroke Patients Only)       Balance Overall balance assessment: Needs assistance Sitting-balance support: No upper extremity supported Sitting balance-Leahy Scale: Fair     Standing balance support: Bilateral upper extremity supported;During functional activity Standing balance-Leahy Scale: Poor Standing balance comment: Has to habe UE support to get stand and pivot                             Pertinent Vitals/Pain Pain Assessment: No/denies pain    Home Living Family/patient expects to be discharged to:: Private residence Living Arrangements: Children Available Help at Discharge: Family;Available 24 hours/day Type of Home: House Home Access: Level entry     Home Layout: One level Home Equipment: Walker - 4 wheels;Wheelchair - manual;Shower seat;Bedside commode      Prior Function Level of Independence: Needs assistance   Gait / Transfers Assistance Needed: states he was mostly doing wheelchair transfers  ADL's / Homemaking Assistance Needed: sons assist with ADLs per patient   Comments: [facility] and had been home not even 24 hours before readmit to hospital.     Hand Dominance   Dominant Hand: Right    Extremity/Trunk Assessment   Upper Extremity Assessment Upper Extremity Assessment: Defer to OT evaluation    Lower Extremity Assessment Lower Extremity Assessment: Generalized weakness    Cervical /  Trunk Assessment Cervical / Trunk Assessment: Normal  Communication   Communication: HOH  Cognition Arousal/Alertness: Awake/alert Behavior During Therapy: WFL for tasks assessed/performed Overall Cognitive Status: Within Functional Limits for tasks assessed                                        General Comments      Exercises     Assessment/Plan    PT Assessment Patient needs continued PT services  PT Problem List  Decreased activity tolerance;Decreased strength;Decreased balance;Decreased mobility;Decreased knowledge of use of DME;Decreased safety awareness;Decreased knowledge of precautions;Cardiopulmonary status limiting activity       PT Treatment Interventions DME instruction;Gait training;Functional mobility training;Therapeutic activities;Therapeutic exercise;Balance training;Patient/family education    PT Goals (Current goals can be found in the Care Plan section)  Acute Rehab PT Goals Patient Stated Goal: to go home PT Goal Formulation: With patient Time For Goal Achievement: 05/09/18 Potential to Achieve Goals: Good    Frequency Min 3X/week   Barriers to discharge        Co-evaluation               AM-PAC PT "6 Clicks" Mobility  Outcome Measure Help needed turning from your back to your side while in a flat bed without using bedrails?: None Help needed moving from lying on your back to sitting on the side of a flat bed without using bedrails?: None Help needed moving to and from a bed to a chair (including a wheelchair)?: A Little Help needed standing up from a chair using your arms (e.g., wheelchair or bedside chair)?: A Little Help needed to walk in hospital room?: A Lot Help needed climbing 3-5 steps with a railing? : Total 6 Click Score: 17    End of Session Equipment Utilized During Treatment: Gait belt;Oxygen(Pt on 2.5 L with sats 99%) Activity Tolerance: Patient limited by fatigue Patient left: in bed;with call bell/phone within reach;with bed alarm set Nurse Communication: Mobility status PT Visit Diagnosis: Unsteadiness on feet (R26.81);Muscle weakness (generalized) (M62.81)    Time: 8502-7741 PT Time Calculation (min) (ACUTE ONLY): 12 min   Charges:   PT Evaluation $PT Eval Moderate Complexity: 1 Mod          Cephas Revard,PT Acute Rehabilitation Services Pager:  [phone]  Office:  [phone]    Berline Lopes 04/25/2018, 10:35 AM

## 2018-04-25 NOTE — Plan of Care (Signed)

## 2018-04-25 NOTE — Progress Notes (Signed)
Patient is cleaned and changed, he is in bed with no complaints. He has had a bed bath and a full linen change.

## 2018-04-26 LAB — BASIC METABOLIC PANEL
Anion gap: 6 (ref 5–15)
BUN: 31 mg/dL — ABNORMAL HIGH (ref 8–23)
CHLORIDE: 118 mmol/L — AB (ref 98–111)
CO2: 18 mmol/L — ABNORMAL LOW (ref 22–32)
Calcium: 7.8 mg/dL — ABNORMAL LOW (ref 8.9–10.3)
Creatinine, Ser: 1.68 mg/dL — ABNORMAL HIGH (ref 0.61–1.24)
GFR calc Af Amer: 41 mL/min — ABNORMAL LOW (ref >60)
GFR calc non Af Amer: 36 mL/min — ABNORMAL LOW (ref >60)
Glucose, Bld: 93 mg/dL (ref 70–99)
Potassium: 4.3 mmol/L (ref 3.5–5.1)
Sodium: 142 mmol/L (ref 135–145)

## 2018-04-26 LAB — CBC WITH DIFFERENTIAL/PLATELET
ABS IMMATURE GRANULOCYTES: 0.09 10*3/uL — AB (ref 0.00–0.07)
Basophils Absolute: 0 10*3/uL (ref 0.0–0.1)
Basophils Relative: 0 %
Eosinophils Absolute: 0.1 10*3/uL (ref 0.0–0.5)
Eosinophils Relative: 1 %
HCT: 28.7 % — ABNORMAL LOW (ref 39.0–52.0)
Hemoglobin: 8.3 g/dL — ABNORMAL LOW (ref 13.0–17.0)
Immature Granulocytes: 1 %
LYMPHS PCT: 26 %
Lymphs Abs: 2.3 10*3/uL (ref 0.7–4.0)
MCH: 27.1 pg (ref 26.0–34.0)
MCHC: 28.9 g/dL — ABNORMAL LOW (ref 30.0–36.0)
MCV: 93.8 fL (ref 80.0–100.0)
Monocytes Absolute: 0.6 10*3/uL (ref 0.1–1.0)
Monocytes Relative: 6 %
Neutro Abs: 5.8 10*3/uL (ref 1.7–7.7)
Neutrophils Relative %: 66 %
Platelets: 104 10*3/uL — ABNORMAL LOW (ref 150–400)
RBC: 3.06 MIL/uL — ABNORMAL LOW (ref 4.22–5.81)
RDW: 18.4 % — ABNORMAL HIGH (ref 11.5–15.5)
WBC: 8.9 10*3/uL (ref 4.0–10.5)
nRBC: 1.1 % — ABNORMAL HIGH (ref 0.0–0.2)

## 2018-04-26 LAB — URINE CULTURE: Culture: 50000 — AB

## 2018-04-26 MED ORDER — LOPERAMIDE HCL 2 MG PO CAPS
2.0000 mg | ORAL_CAPSULE | Freq: Four times a day (QID) | ORAL | Status: DC
  Administered 2018-04-26 – 2018-04-30 (×17): 2 mg via ORAL
  Filled 2018-04-26 (×17): qty 1

## 2018-04-26 MED ORDER — METHOTREXATE 2.5 MG PO TABS
25.0000 mg | ORAL_TABLET | ORAL | Status: DC
  Administered 2018-04-27: 25 mg via ORAL
  Filled 2018-04-26: qty 10

## 2018-04-26 NOTE — Progress Notes (Signed)
Pt refused to ambulate. Patient was educated about importance of moving around. But pt still refused.

## 2018-04-26 NOTE — Progress Notes (Signed)
Pt has been having bowel movements around Flexical. MD notified

## 2018-04-26 NOTE — Progress Notes (Signed)
PROGRESS NOTE    Hunter Choi  VQM:[id number] DOB: 07/17/1927 DOA: 04/22/2018 PCP: [name], MD   Brief Narrative: Patient is 85 year old male with past medical history of rheumatoid arthritis on methotrexate and prednisone, stroke, GERD, abdominal aortic aneurysm, BPH, hypothyroidism, coronary artery disease, chronic systolic heart failure with EF of 30%, paroxysmal A. fib, status post TAVR was recently hospitalized and was discharged to skilled facility but came back with complaints of multiple episodes of diarrhea, vomiting.  GI pathogen panel showed  NOROVIRUS.  Currently on conservative management.  Assessment & Plan:   Principal Problem:   Nausea vomiting and diarrhea Active Problems:   Rheumatoid arthritis (HCC)   GERD (gastroesophageal reflux disease)   Thyroid disease   Coronary artery disease involving coronary bypass graft of native heart with angina pectoris (HCC)   S/P TAVR (transcatheter aortic valve replacement)   Essential hypertension   Hypothyroidism  Norovirus diarhoea: Presented with loose stools, nausea, vomiting.  Had a fever of 100.4 on presentation.  No leukocytosis.  Has rectal tube with profuse diarrhea.  Still having significant output from diarrhea.   Denies any abdominal pain.  Complains of some nausea this morning Continue gentle hydration.  Chronic systolic heart failure: Ejection fraction of 30%.  Was on Lasix which is on hold.  Continue to monitor input and output.On gentle IV fluids  Acute kidney injury: Baseline creatinine around 1.5.  Creatinine slowly  trending down.   Continue IV fluids.  Will be careful with IV fluids since he has heart failure.  Cough: Improved.  Chest x-ray did not show any pneumonia.  Thrombocytopenia: We will discontinue Lovenox.  Also on aspirin and Plavix from home.  Continue to monitor  History of rheumatoid arthritis: Methotrexate and prednisone.  Hypothyroidism: Continue Synthyroid  History of  coronary disease: Status post CABG, status post TAVR .  On aspirin, Plavix, statin and Coreg  Hypertension: Losartan on hold.  Continue amlodipine.  Continue blood pressure monitoring.  Currently stable  Deconditioning/debility: PT evaluated = the patient and recommended skilled nursing facility.  Social worker aware.          DVT prophylaxis:SCD Code Status: Full Family Communication: None present at the bedside Disposition Plan: Back to skilled facility after resolution of diarrhea   Consultants: None  Procedures: None  Antimicrobials:  Anti-infectives (From admission, onward)   Start     Dose/Rate Route Frequency Ordered Stop   04/22/18 1530  cefTRIAXone (ROCEPHIN) 1 g in sodium chloride 0.9 % 100 mL IVPB     1 g 200 mL/hr over 30 Minutes Intravenous  Once 04/22/18 1523 04/22/18 1640      Subjective: Patient seen and examined the bedside this morning.  Remains hemodynamically stable but weak.  Still has significant output from the rectal tube.  Stool  was leaking around rectal tube last night.  Also complains of some nausea this morning.  Objective: Vitals:   04/25/18 2018 04/26/18 0325 04/26/18 1013 04/26/18 1132  BP: 120/62 112/68 (!) 121/59 100/60  Pulse: 66 68 69 66  Resp: 18 18  (!) 22  Temp: 98 F (36.7 C) 98 F (36.7 C)  97.9 F (36.6 C)  TempSrc: Oral Oral  Oral  SpO2: 98% 98%  100%  Weight:  68.9 kg      Intake/Output Summary (Last 24 hours) at 04/26/2018 1149 Last data filed at 04/26/2018 0940 Gross per 24 hour  Intake 1248.28 ml  Output 600 ml  Net 648.28 ml   [employer]  04/24/18 0500 04/25/18 0342 04/26/18 0325  Weight: 67.2 kg 68.4 kg 68.9 kg    Examination:  General exam: Debilitated elderly male, generalized weakness  HEENT:PERRL,Oral mucosa moist, Ear/Nose normal on gross exam Respiratory system: Bilateral equal air entry, normal vesicular breath sounds, no wheezes or crackles  Cardiovascular system: S1 & S2 heard, RRR. No JVD,  murmurs, rubs, gallops or clicks. Gastrointestinal system: Abdomen is nondistended, soft and nontender. No organomegaly or masses felt. Normal bowel sounds heard. Central nervous system: Alert and oriented. No focal neurological deficits. Extremities: No edema, no clubbing ,no cyanosis, distal peripheral pulses palpable. Skin: No rashes, lesions or ulcers,no icterus ,no pallor MSK: Normal muscle bulk,tone ,power Psychiatry: Judgement and insight appear normal. Mood & affect appropriate.  GU: Rectal tube    Data Reviewed: I have personally reviewed following labs and imaging studies  CBC: Recent Labs  Lab 04/22/18 1311 04/23/18 0250 04/24/18 0803 04/25/18 0704 04/26/18 0457  WBC 5.2 4.5 4.8 6.6 8.9  NEUTROABS 3.8  --   --   --  5.8  HGB 9.4* 9.1* 9.0* 8.4* 8.3*  HCT 31.8* 31.4* 32.3* 28.4* 28.7*  MCV 90.1 91.0 93.4 92.8 93.8  PLT 152 138* 138* 114* 104*   Basic Metabolic Panel: Recent Labs  Lab 04/22/18 1311 04/23/18 0250 04/24/18 0803 04/25/18 0704 04/26/18 0457  NA 136 138 143 140 142  K 4.1 3.9 3.2* 4.3 4.3  CL 102 107 116* 115* 118*  CO2 23 19* 18* 18* 18*  GLUCOSE 139* 115* 112* 91 93  BUN 36* 37* 35* 33* 31*  CREATININE 1.51* 1.68* 1.82* 1.86* 1.68*  CALCIUM 8.3* 7.8* 8.3* 8.1* 7.8*  MG  --   --  2.0  --   --    GFR: Estimated Creatinine Clearance: 28.4 mL/min (A) (by C-G formula based on SCr of 1.68 mg/dL (H)). Liver Function Tests: Recent Labs  Lab 04/22/18 1311  AST 20  ALT 10  ALKPHOS 52  BILITOT 1.0  PROT 6.5  ALBUMIN 3.5   Recent Labs  Lab 04/22/18 1311  LIPASE 23   No results for input(s): AMMONIA in the last 168 hours. Coagulation Profile: No results for input(s): INR, PROTIME in the last 168 hours. Cardiac Enzymes: No results for input(s): CKTOTAL, CKMB, CKMBINDEX, TROPONINI in the last 168 hours. BNP (last 3 results) No results for input(s): PROBNP in the last 8760 hours. HbA1C: No results for input(s): HGBA1C in the last 72  hours. CBG: No results for input(s): GLUCAP in the last 168 hours. Lipid Profile: No results for input(s): CHOL, HDL, LDLCALC, TRIG, CHOLHDL, LDLDIRECT in the last 72 hours. Thyroid Function Tests: No results for input(s): TSH, T4TOTAL, FREET4, T3FREE, THYROIDAB in the last 72 hours. Anemia Panel: No results for input(s): VITAMINB12, FOLATE, FERRITIN, TIBC, IRON, RETICCTPCT in the last 72 hours. Sepsis Labs: No results for input(s): PROCALCITON, LATICACIDVEN in the last 168 hours.  Recent Results (from the past 240 hour(s))  C difficile quick scan w PCR reflex     Status: None   Collection Time: 04/22/18  1:12 PM  Result Value Ref Range Status   C Diff antigen NEGATIVE NEGATIVE Final   C Diff toxin NEGATIVE NEGATIVE Final   C Diff interpretation No C. difficile detected.  Final  Urine culture     Status: Abnormal   Collection Time: 04/22/18 11:50 PM  Result Value Ref Range Status   Specimen Description URINE, CLEAN CATCH  Final   Special Requests NONE  Final  Culture (A)  Final    50,000 COLONIES/mL AEROCOCCUS URINAE Standardized susceptibility testing for this organism is not available. Performed at [facility], [address], [place], [place] [address]    Report Status 04/26/2018 FINAL  Final  Gastrointestinal Panel by PCR , Stool     Status: Abnormal   Collection Time: 04/23/18 10:32 PM  Result Value Ref Range Status   Campylobacter species NOT DETECTED NOT DETECTED Final   Plesimonas shigelloides NOT DETECTED NOT DETECTED Final   Salmonella species NOT DETECTED NOT DETECTED Final   Yersinia enterocolitica NOT DETECTED NOT DETECTED Final   Vibrio species NOT DETECTED NOT DETECTED Final   Vibrio cholerae NOT DETECTED NOT DETECTED Final   Enteroaggregative E coli (EAEC) NOT DETECTED NOT DETECTED Final   Enteropathogenic E coli (EPEC) NOT DETECTED NOT DETECTED Final   Enterotoxigenic E coli (ETEC) NOT DETECTED NOT DETECTED Final   Shiga like toxin producing E coli  (STEC) NOT DETECTED NOT DETECTED Final   Shigella/Enteroinvasive E coli (EIEC) NOT DETECTED NOT DETECTED Final   Cryptosporidium NOT DETECTED NOT DETECTED Final   Cyclospora cayetanensis NOT DETECTED NOT DETECTED Final   Entamoeba histolytica NOT DETECTED NOT DETECTED Final   Giardia lamblia NOT DETECTED NOT DETECTED Final   Adenovirus F40/41 NOT DETECTED NOT DETECTED Final   Astrovirus NOT DETECTED NOT DETECTED Final   Norovirus GI/GII DETECTED (A) NOT DETECTED Final    Comment: RESULT CALLED TO, READ BACK BY AND VERIFIED WITH:  NADIA HARVEY AT 1105 04/24/2018 SDR    Rotavirus A NOT DETECTED NOT DETECTED Final   Sapovirus (I, II, IV, and V) NOT DETECTED NOT DETECTED Final    Comment: Performed at [facility], [address] [place], [place] [address]  Respiratory Panel by PCR     Status: None   Collection Time: 04/24/18 12:47 PM  Result Value Ref Range Status   Adenovirus NOT DETECTED NOT DETECTED Final   Coronavirus 229E NOT DETECTED NOT DETECTED Final   Coronavirus HKU1 NOT DETECTED NOT DETECTED Final   Coronavirus NL63 NOT DETECTED NOT DETECTED Final   Coronavirus OC43 NOT DETECTED NOT DETECTED Final   Metapneumovirus NOT DETECTED NOT DETECTED Final   Rhinovirus / Enterovirus NOT DETECTED NOT DETECTED Final   Influenza A NOT DETECTED NOT DETECTED Final   Influenza B NOT DETECTED NOT DETECTED Final   Parainfluenza Virus 1 NOT DETECTED NOT DETECTED Final   Parainfluenza Virus 2 NOT DETECTED NOT DETECTED Final   Parainfluenza Virus 3 NOT DETECTED NOT DETECTED Final   Parainfluenza Virus 4 NOT DETECTED NOT DETECTED Final   Respiratory Syncytial Virus NOT DETECTED NOT DETECTED Final   Bordetella pertussis NOT DETECTED NOT DETECTED Final   Chlamydophila pneumoniae NOT DETECTED NOT DETECTED Final   Mycoplasma pneumoniae NOT DETECTED NOT DETECTED Final    Comment: Performed at [facility], [address] [place], [place] [address]         Radiology  Studies: No results found.      Scheduled Meds: . acetaminophen  1,000 mg Oral BID  . amLODipine  5 mg Oral Daily  . aspirin EC  81 mg Oral Daily  . atorvastatin  20 mg Oral Daily  . carvedilol  6.25 mg Oral BID WC  . clopidogrel  75 mg Oral Daily  . dextromethorphan  15 mg Oral BID  . folic acid  3 mg Oral Daily  . levothyroxine  88 mcg Oral Daily  . loperamide  2 mg  Oral Q6H  . loratadine  10 mg Oral Daily  . multivitamin with minerals  1 tablet Oral Daily  . pantoprazole  40 mg Oral Daily  . predniSONE  5 mg Oral Daily  . vitamin B-12  1,000 mcg Oral Daily   Continuous Infusions: . sodium chloride 75 mL/hr at 04/25/18 2123     LOS: 2 days    Time spent: 35 mins.More than 50% of that time was spent in counseling and/or coordination of care.      Burnadette PopAmrit Karessa Onorato, MD [facility] Pager [phone]  If 7PM-7AM, please contact night-coverage [email or web address] Password [email or web address] 04/26/2018, 11:49 AM

## 2018-04-26 NOTE — Clinical Social Work Note (Signed)
CSW acknowledges SNF consult. PT is recommending HHPT. Patient was at [facility] 12/31-1/24 and then discharged home with son. MD aware. If he returns to SNF, he will have a daily copay of approximately $160 per day. He does not have a secondary insurance to cover this.   CSW signing off.  Charlynn Court, CSW [phone]

## 2018-04-27 ENCOUNTER — Inpatient Hospital Stay (HOSPITAL_COMMUNITY): Payer: Medicare Other

## 2018-04-27 LAB — CBC WITH DIFFERENTIAL/PLATELET
Abs Immature Granulocytes: 0.24 10*3/uL — ABNORMAL HIGH (ref 0.00–0.07)
Basophils Absolute: 0 10*3/uL (ref 0.0–0.1)
Basophils Relative: 0 %
EOS ABS: 0.2 10*3/uL (ref 0.0–0.5)
Eosinophils Relative: 2 %
HCT: 29.9 % — ABNORMAL LOW (ref 39.0–52.0)
Hemoglobin: 8.5 g/dL — ABNORMAL LOW (ref 13.0–17.0)
Immature Granulocytes: 3 %
Lymphocytes Relative: 29 %
Lymphs Abs: 2.7 10*3/uL (ref 0.7–4.0)
MCH: 26.5 pg (ref 26.0–34.0)
MCHC: 28.4 g/dL — AB (ref 30.0–36.0)
MCV: 93.1 fL (ref 80.0–100.0)
Monocytes Absolute: 0.7 10*3/uL (ref 0.1–1.0)
Monocytes Relative: 7 %
NRBC: 1 % — AB (ref 0.0–0.2)
Neutro Abs: 5.7 10*3/uL (ref 1.7–7.7)
Neutrophils Relative %: 59 %
Platelets: 99 10*3/uL — ABNORMAL LOW (ref 150–400)
RBC: 3.21 MIL/uL — ABNORMAL LOW (ref 4.22–5.81)
RDW: 18.1 % — AB (ref 11.5–15.5)
WBC: 9.6 10*3/uL (ref 4.0–10.5)

## 2018-04-27 LAB — BASIC METABOLIC PANEL
Anion gap: 5 (ref 5–15)
BUN: 30 mg/dL — ABNORMAL HIGH (ref 8–23)
CO2: 20 mmol/L — AB (ref 22–32)
Calcium: 8.1 mg/dL — ABNORMAL LOW (ref 8.9–10.3)
Chloride: 115 mmol/L — ABNORMAL HIGH (ref 98–111)
Creatinine, Ser: 1.49 mg/dL — ABNORMAL HIGH (ref 0.61–1.24)
GFR calc non Af Amer: 41 mL/min — ABNORMAL LOW (ref >60)
GFR, EST AFRICAN AMERICAN: 48 mL/min — AB (ref >60)
Glucose, Bld: 95 mg/dL (ref 70–99)
Potassium: 4.7 mmol/L (ref 3.5–5.1)
SODIUM: 140 mmol/L (ref 135–145)

## 2018-04-27 MED ORDER — GERHARDT'S BUTT CREAM
TOPICAL_CREAM | Freq: Two times a day (BID) | CUTANEOUS | Status: DC
  Administered 2018-04-27 – 2018-04-30 (×6): via TOPICAL
  Filled 2018-04-27: qty 1

## 2018-04-27 MED ORDER — FUROSEMIDE 10 MG/ML IJ SOLN
20.0000 mg | Freq: Once | INTRAMUSCULAR | Status: AC
  Administered 2018-04-27: 20 mg via INTRAVENOUS
  Filled 2018-04-27: qty 2

## 2018-04-27 MED ORDER — FUROSEMIDE 40 MG PO TABS
40.0000 mg | ORAL_TABLET | Freq: Every day | ORAL | Status: DC
  Filled 2018-04-27: qty 1

## 2018-04-27 NOTE — Progress Notes (Signed)
PROGRESS NOTE    Hunter HoarClyde G Choi  VHQ:[id number] DOB: 1927-11-20 DOA: 04/22/2018 PCP: Lorenda IshiharaVaradarajan, [name], MD   Brief Narrative: Patient is 85 year old male with past medical history of rheumatoid arthritis on methotrexate and prednisone, stroke, GERD, abdominal aortic aneurysm, BPH, hypothyroidism, coronary artery disease, chronic systolic heart failure with EF of 30%, paroxysmal A. fib, status post TAVR was recently hospitalized and was discharged to skilled facility but came back with complaints of multiple episodes of diarrhea, vomiting.  GI pathogen panel showed  NOROVIRUS.  Currently on conservative management.  Assessment & Plan:   Principal Problem:   Nausea vomiting and diarrhea Active Problems:   Rheumatoid arthritis (HCC)   GERD (gastroesophageal reflux disease)   Thyroid disease   Coronary artery disease involving coronary bypass graft of native heart with angina pectoris (HCC)   S/P TAVR (transcatheter aortic valve replacement)   Essential hypertension   Hypothyroidism  Norovirus diarhoea: Presented with loose stools, nausea, vomiting.  Had a fever of 100.4 on presentation.  No leukocytosis.  Has rectal tube for  profuse diarrhea.  300 mL of his stool was emptied this morning. Denies any abdominal pain.  IV fluids will be stopped.  We will remove the rectal tube today if the output is low.  Chronic systolic heart failure: Ejection fraction of 30%.  Was on Lasix which is on hold.  Continue to monitor input and output.Fluids stopped.  Acute kidney injury: Baseline creatinine around 1.5.  Creatinine now at baseline.  IV fluids stopped.  Cough: Still has cough and looks short of breath today.  He is not on home oxygen.  Currently requiring oxygen at 2 L/min.  Will check chest x-ray.  Thrombocytopenia: Stable. On aspirin and Plavix from home.  Continue to monitor.  Lovenox discontinued.  History of rheumatoid arthritis: Methotrexate and prednisone.  Hypothyroidism:  Continue Synthyroid  History of coronary disease: Status post CABG, status post TAVR .  On aspirin, Plavix, statin and Coreg  Hypertension: Losartan on hold.  Continue amlodipine.  Continue blood pressure monitoring.  Currently BO is stable  Deconditioning/debility: PT evaluated the patient and recommended HHPT.          DVT prophylaxis:SCD Code Status: Full Family Communication: None present at the bedside Disposition Plan: Home likely tomorrow if diarrhea slows down.   Consultants: None  Procedures: None  Antimicrobials:  Anti-infectives (From admission, onward)   Start     Dose/Rate Route Frequency Ordered Stop   04/22/18 1530  cefTRIAXone (ROCEPHIN) 1 g in sodium chloride 0.9 % 100 mL IVPB     1 g 200 mL/hr over 30 Minutes Intravenous  Once 04/22/18 1523 04/22/18 1640      Subjective: Patient seen and examined at bedside this morning.  Still coughing.  Looks a little short of breath today.  We will do a chest x-ray.  Diarrhea might have slowed down.  Emptied 300 mL this morning  Objective: Vitals:   04/26/18 1013 04/26/18 1132 04/26/18 1940 04/27/18 0535  BP: (!) 121/59 100/60 102/66 121/65  Pulse: 69 66 68 97  Resp:  (!) 22 18 18   Temp:  97.9 F (36.6 C) 98 F (36.7 C) 98 F (36.7 C)  TempSrc:  Oral Oral Oral  SpO2:  100% 98% 97%  Weight:    70 kg    Intake/Output Summary (Last 24 hours) at 04/27/2018 1038 Last data filed at 04/27/2018 1006 Gross per 24 hour  Intake 2809.79 ml  Output 800 ml  Net 2009.79 ml  Filed Weights   04/25/18 0342 04/26/18 0325 04/27/18 0535  Weight: 68.4 kg 68.9 kg 70 kg    Examination:  General exam: Debilitated elderly male, generalized weakness HEENT:PERRL,Oral mucosa moist, Ear/Nose normal on gross exam Respiratory system: Bilateral decreased air entry, coarse breathing sounds Cardiovascular system: S1 & S2 heard, RRR. No JVD, murmurs, rubs, gallops or clicks. Gastrointestinal system: Abdomen is nondistended, soft  and nontender. No organomegaly or masses felt. Normal bowel sounds heard. Central nervous system: Alert and oriented. No focal neurological deficits. Extremities: No edema, no clubbing ,no cyanosis, distal peripheral pulses palpable. Skin: No rashes, lesions or ulcers,no icterus ,no pallor GU: rectal tube    Data Reviewed: I have personally reviewed following labs and imaging studies  CBC: Recent Labs  Lab 04/22/18 1311 04/23/18 0250 04/24/18 0803 04/25/18 0704 04/26/18 0457 04/27/18 0603  WBC 5.2 4.5 4.8 6.6 8.9 9.6  NEUTROABS 3.8  --   --   --  5.8 5.7  HGB 9.4* 9.1* 9.0* 8.4* 8.3* 8.5*  HCT 31.8* 31.4* 32.3* 28.4* 28.7* 29.9*  MCV 90.1 91.0 93.4 92.8 93.8 93.1  PLT 152 138* 138* 114* 104* 99*   Basic Metabolic Panel: Recent Labs  Lab 04/23/18 0250 04/24/18 0803 04/25/18 0704 04/26/18 0457 04/27/18 0603  NA 138 143 140 142 140  K 3.9 3.2* 4.3 4.3 4.7  CL 107 116* 115* 118* 115*  CO2 19* 18* 18* 18* 20*  GLUCOSE 115* 112* 91 93 95  BUN 37* 35* 33* 31* 30*  CREATININE 1.68* 1.82* 1.86* 1.68* 1.49*  CALCIUM 7.8* 8.3* 8.1* 7.8* 8.1*  MG  --  2.0  --   --   --    GFR: Estimated Creatinine Clearance: 32 mL/min (A) (by C-G formula based on SCr of 1.49 mg/dL (H)). Liver Function Tests: Recent Labs  Lab 04/22/18 1311  AST 20  ALT 10  ALKPHOS 52  BILITOT 1.0  PROT 6.5  ALBUMIN 3.5   Recent Labs  Lab 04/22/18 1311  LIPASE 23   No results for input(s): AMMONIA in the last 168 hours. Coagulation Profile: No results for input(s): INR, PROTIME in the last 168 hours. Cardiac Enzymes: No results for input(s): CKTOTAL, CKMB, CKMBINDEX, TROPONINI in the last 168 hours. BNP (last 3 results) No results for input(s): PROBNP in the last 8760 hours. HbA1C: No results for input(s): HGBA1C in the last 72 hours. CBG: No results for input(s): GLUCAP in the last 168 hours. Lipid Profile: No results for input(s): CHOL, HDL, LDLCALC, TRIG, CHOLHDL, LDLDIRECT in the last  72 hours. Thyroid Function Tests: No results for input(s): TSH, T4TOTAL, FREET4, T3FREE, THYROIDAB in the last 72 hours. Anemia Panel: No results for input(s): VITAMINB12, FOLATE, FERRITIN, TIBC, IRON, RETICCTPCT in the last 72 hours. Sepsis Labs: No results for input(s): PROCALCITON, LATICACIDVEN in the last 168 hours.  Recent Results (from the past 240 hour(s))  C difficile quick scan w PCR reflex     Status: None   Collection Time: 04/22/18  1:12 PM  Result Value Ref Range Status   C Diff antigen NEGATIVE NEGATIVE Final   C Diff toxin NEGATIVE NEGATIVE Final   C Diff interpretation No C. difficile detected.  Final  Urine culture     Status: Abnormal   Collection Time: 04/22/18 11:50 PM  Result Value Ref Range Status   Specimen Description URINE, CLEAN CATCH  Final   Special Requests NONE  Final   Culture (A)  Final    50,000 COLONIES/mL AEROCOCCUS URINAE  Standardized susceptibility testing for this organism is not available. Performed at [facility], [address] [place], [place] [address]    Report Status 04/26/2018 FINAL  Final  Gastrointestinal Panel by PCR , Stool     Status: Abnormal   Collection Time: 04/23/18 10:32 PM  Result Value Ref Range Status   Campylobacter species NOT DETECTED NOT DETECTED Final   Plesimonas shigelloides NOT DETECTED NOT DETECTED Final   Salmonella species NOT DETECTED NOT DETECTED Final   Yersinia enterocolitica NOT DETECTED NOT DETECTED Final   Vibrio species NOT DETECTED NOT DETECTED Final   Vibrio cholerae NOT DETECTED NOT DETECTED Final   Enteroaggregative E coli (EAEC) NOT DETECTED NOT DETECTED Final   Enteropathogenic E coli (EPEC) NOT DETECTED NOT DETECTED Final   Enterotoxigenic E coli (ETEC) NOT DETECTED NOT DETECTED Final   Shiga like toxin producing E coli (STEC) NOT DETECTED NOT DETECTED Final   Shigella/Enteroinvasive E coli (EIEC) NOT DETECTED NOT DETECTED Final   Cryptosporidium NOT DETECTED NOT DETECTED Final    Cyclospora cayetanensis NOT DETECTED NOT DETECTED Final   Entamoeba histolytica NOT DETECTED NOT DETECTED Final   Giardia lamblia NOT DETECTED NOT DETECTED Final   Adenovirus F40/41 NOT DETECTED NOT DETECTED Final   Astrovirus NOT DETECTED NOT DETECTED Final   Norovirus GI/GII DETECTED (A) NOT DETECTED Final    Comment: RESULT CALLED TO, READ BACK BY AND VERIFIED WITH:  NADIA HARVEY AT 1105 04/24/2018 SDR    Rotavirus A NOT DETECTED NOT DETECTED Final   Sapovirus (I, II, IV, and V) NOT DETECTED NOT DETECTED Final    Comment: Performed at [facility], [address] [place], [place] [address]  Respiratory Panel by PCR     Status: None   Collection Time: 04/24/18 12:47 PM  Result Value Ref Range Status   Adenovirus NOT DETECTED NOT DETECTED Final   Coronavirus 229E NOT DETECTED NOT DETECTED Final   Coronavirus HKU1 NOT DETECTED NOT DETECTED Final   Coronavirus NL63 NOT DETECTED NOT DETECTED Final   Coronavirus OC43 NOT DETECTED NOT DETECTED Final   Metapneumovirus NOT DETECTED NOT DETECTED Final   Rhinovirus / Enterovirus NOT DETECTED NOT DETECTED Final   Influenza A NOT DETECTED NOT DETECTED Final   Influenza B NOT DETECTED NOT DETECTED Final   Parainfluenza Virus 1 NOT DETECTED NOT DETECTED Final   Parainfluenza Virus 2 NOT DETECTED NOT DETECTED Final   Parainfluenza Virus 3 NOT DETECTED NOT DETECTED Final   Parainfluenza Virus 4 NOT DETECTED NOT DETECTED Final   Respiratory Syncytial Virus NOT DETECTED NOT DETECTED Final   Bordetella pertussis NOT DETECTED NOT DETECTED Final   Chlamydophila pneumoniae NOT DETECTED NOT DETECTED Final   Mycoplasma pneumoniae NOT DETECTED NOT DETECTED Final    Comment: Performed at [facility], [address] [place], [place] [address]         Radiology Studies: No results found.      Scheduled Meds: . acetaminophen  1,000 mg Oral BID  . amLODipine  5 mg Oral Daily  . aspirin EC  81 mg Oral Daily  . atorvastatin  20  mg Oral Daily  . carvedilol  6.25 mg Oral BID WC  . clopidogrel  75 mg Oral Daily  . dextromethorphan  15 mg Oral BID  . folic acid  3 mg Oral Daily  . levothyroxine  88 mcg Oral Daily  . loperamide  2 mg Oral Q6H  . loratadine  10 mg Oral Daily  .  methotrexate  25 mg Oral Weekly  . multivitamin with minerals  1 tablet Oral Daily  . pantoprazole  40 mg Oral Daily  . predniSONE  5 mg Oral Daily  . vitamin B-12  1,000 mcg Oral Daily   Continuous Infusions:    LOS: 3 days    Time spent: 35 mins.More than 50% of that time was spent in counseling and/or coordination of care.      Burnadette PopAmrit Ikeya Brockel, MD [facility] Pager [phone]  If 7PM-7AM, please contact night-coverage [email or web address] Password [email or web address] 04/27/2018, 10:38 AM

## 2018-04-27 NOTE — Progress Notes (Signed)
Physical Therapy Treatment Patient Details Name: Hunter HoarClyde G Casale MRN: [id number] DOB: [birth year] Today's Date: 04/27/2018    History of Present Illness Patient is 85 year old male with past medical history of rheumatoid arthritis on methotrexate and prednisone, stroke, GERD, abdominal aortic aneurysm, BPH, hypothyroidism, coronary artery disease, chronic systolic heart failure with EF of 30%, paroxysmal A. fib, status post TAVR was recently hospitalized and was discharged to skilled facility but came back with complaints of multiple episodes of diarrhea, vomiting.  GI pathogen panel showed  NOROVIRUS.    PT Comments    Pt admitted with above diagnosis. Pt currently with functional limitations due to balance and endurance deficits. Pt was able to sit EOB x 4 min with supervision.  REfused to do anything else.   Pt will benefit from skilled PT to increase their independence and safety with mobility to allow discharge to the venue listed below.     Follow Up Recommendations  Home health PT;Supervision/Assistance - 1924 hour(states son can assist x 24 hours. )     Equipment Recommendations  None recommended by PT    Recommendations for Other Services       Precautions / Restrictions Precautions Precautions: Fall(Cdiff) Precaution Comments: flexiseal, foley Restrictions Weight Bearing Restrictions: No    Mobility  Bed Mobility Overal bed mobility: Needs Assistance Bed Mobility: Supine to Sit     Supine to sit: Min assist     General bed mobility comments: alittle assist for elevation of trunk.   Transfers                 General transfer comment: refused to get OOB, only agreed to sit EOB.   Ambulation/Gait             General Gait Details: NT   Stairs             Wheelchair Mobility    Modified Rankin (Stroke Patients Only)       Balance Overall balance assessment: Needs assistance Sitting-balance support: No upper extremity supported Sitting  balance-Leahy Scale: Fair Sitting balance - Comments: Pt agreed to sit for 4 min only.  supervision to sit.                                     Cognition Arousal/Alertness: Awake/alert Behavior During Therapy: WFL for tasks assessed/performed Overall Cognitive Status: Within Functional Limits for tasks assessed                                        Exercises General Exercises - Lower Extremity Ankle Circles/Pumps: AROM;Both;10 reps;Seated Long Arc Quad: AROM;Both;10 reps;Seated    General Comments        Pertinent Vitals/Pain Pain Assessment: No/denies pain    Home Living                      Prior Function            PT Goals (current goals can now be found in the care plan section) Acute Rehab PT Goals Patient Stated Goal: to go home Progress towards PT goals: Progressing toward goals    Frequency    Min 3X/week      PT Plan Current plan remains appropriate    Co-evaluation  AM-PAC PT "6 Clicks" Mobility   Outcome Measure  Help needed turning from your back to your side while in a flat bed without using bedrails?: None Help needed moving from lying on your back to sitting on the side of a flat bed without using bedrails?: None Help needed moving to and from a bed to a chair (including a wheelchair)?: A Little Help needed standing up from a chair using your arms (e.g., wheelchair or bedside chair)?: A Little Help needed to walk in hospital room?: A Lot Help needed climbing 3-5 steps with a railing? : Total 6 Click Score: 17    End of Session Equipment Utilized During Treatment: Gait belt Activity Tolerance: Patient limited by fatigue Patient left: in bed;with call bell/phone within reach;with bed alarm set Nurse Communication: Mobility status PT Visit Diagnosis: Unsteadiness on feet (R26.81);Muscle weakness (generalized) (M62.81)     Time: 6063-0160 PT Time Calculation (min) (ACUTE ONLY): 23  min  Charges:  $Therapeutic Exercise: 8-22 mins $Therapeutic Activity: 8-22 mins                     Jasime Westergren,PT Acute Rehabilitation Services Pager:  [phone]  Office:  [phone]     Berline Lopes 04/27/2018, 4:14 PM

## 2018-04-28 ENCOUNTER — Other Ambulatory Visit: Payer: Self-pay

## 2018-04-28 LAB — BASIC METABOLIC PANEL
Anion gap: 10 (ref 5–15)
BUN: 30 mg/dL — ABNORMAL HIGH (ref 8–23)
CO2: 18 mmol/L — ABNORMAL LOW (ref 22–32)
Calcium: 8 mg/dL — ABNORMAL LOW (ref 8.9–10.3)
Chloride: 110 mmol/L (ref 98–111)
Creatinine, Ser: 1.49 mg/dL — ABNORMAL HIGH (ref 0.61–1.24)
GFR calc Af Amer: 48 mL/min — ABNORMAL LOW (ref >60)
GFR calc non Af Amer: 41 mL/min — ABNORMAL LOW (ref >60)
Glucose, Bld: 89 mg/dL (ref 70–99)
Potassium: 4.5 mmol/L (ref 3.5–5.1)
Sodium: 138 mmol/L (ref 135–145)

## 2018-04-28 MED ORDER — FUROSEMIDE 10 MG/ML IJ SOLN
40.0000 mg | Freq: Once | INTRAMUSCULAR | Status: AC
  Administered 2018-04-28: 40 mg via INTRAVENOUS
  Filled 2018-04-28: qty 4

## 2018-04-28 NOTE — Progress Notes (Addendum)
PROGRESS NOTE    Hunter Choi  NWG:[id number] DOB: 14-Jul-1927 DOA: 04/22/2018 PCP: [name], MD   Brief Narrative: Patient is 85 year old male with past medical history of rheumatoid arthritis on methotrexate and prednisone, stroke, GERD, abdominal aortic aneurysm, BPH, hypothyroidism, coronary artery disease, chronic systolic heart failure with EF of 30%, paroxysmal A. fib, status post TAVR was recently hospitalized and was discharged to skilled facility but came back with complaints of multiple episodes of diarrhea, vomiting.  GI pathogen panel showed  NOROVIRUS.  Currently on conservative management.  Assessment & Plan:   Principal Problem:   Nausea vomiting and diarrhea Active Problems:   Rheumatoid arthritis (HCC)   GERD (gastroesophageal reflux disease)   Thyroid disease   Coronary artery disease involving coronary bypass graft of native heart with angina pectoris (HCC)   S/P TAVR (transcatheter aortic valve replacement)   Essential hypertension   Hypothyroidism  Norovirus diarhoea: Presented with loose stools, nausea, vomiting.  Had a fever of 100.4 on presentation.  No leukocytosis.  Was on rectal tube for  profuse diarrhea.  No more diarrhoea.Rectal tube has been removed. Denies any abdominal pain.  IV fluids .  Chronic systolic heart failure: Ejection fraction of 30%.  Was on Lasix which is on hold.  Continue to monitor input and output.Fluids stopped.  Acute kidney injury: Baseline creatinine around 1.5.  Creatinine now at baseline.  IV fluids stopped.  Cough: Still has cough and looks short of breath today.  He is not on home oxygen.  Currently off oxygen but his saturation fluctuates.  I have requested for ambulation, sitting in the chair.Will check BMP today and he might need IV lasix one more dose.  Chest x-ray done yesterday shows pulmonary edema.  Thrombocytopenia: Stable. On aspirin and Plavix from home.  Continue to monitor.  Lovenox  discontinued.  History of rheumatoid arthritis: Methotrexate and prednisone.  Hypothyroidism: Continue Synthyroid  History of coronary disease: Status post CABG, status post TAVR .  On aspirin, Plavix, statin and Coreg  Hypertension: Losartan on hold.  Continue amlodipine.  Continue blood pressure monitoring.  Currently BO is stable  Deconditioning/debility: PT evaluated the patient and recommended HHPT.          DVT prophylaxis:SCD Code Status: Full Family Communication: None present at the bedside.Called son twice,phone not received Disposition Plan: Home with HHPT tomorrow  Consultants: None  Procedures: None  Antimicrobials:  Anti-infectives (From admission, onward)   Start     Dose/Rate Route Frequency Ordered Stop   04/22/18 1530  cefTRIAXone (ROCEPHIN) 1 g in sodium chloride 0.9 % 100 mL IVPB     1 g 200 mL/hr over 30 Minutes Intravenous  Once 04/22/18 1523 04/22/18 1640      Subjective: Patient seen and examined the bedside this morning.  Off rectal tube.  Diarrhea has stopped.  Still looks short of breath this morning.  Objective: Vitals:   04/27/18 1830 04/27/18 1951 04/28/18 0512 04/28/18 0835  BP: (!) 113/59 (!) 122/58 (!) 110/53 116/63  Pulse: 87 62 63   Resp:  18 18   Temp:  98 F (36.7 C) 99.3 F (37.4 C)   TempSrc:  Oral Oral   SpO2: (!) 79% 96% 94%   Weight:   71 kg     Intake/Output Summary (Last 24 hours) at 04/28/2018 0915 Last data filed at 04/28/2018 0854 Gross per 24 hour  Intake 1230 ml  Output 700 ml  Net 530 ml   Filed Weights   04/26/18  [id number] 04/27/18 0535 04/28/18 0512  Weight: 68.9 kg 70 kg 71 kg    Examination:  General exam: Debilitated elderly male, generalized weakness HEENT:PERRL,Oral mucosa moist, Ear/Nose normal on gross exam Respiratory system: Bilateral decreased air entry, basal crackles, rhonchi Cardiovascular system: S1 & S2 heard, RRR. No JVD, murmurs, rubs, gallops or clicks. Gastrointestinal system: Abdomen  is nondistended, soft and nontender. No organomegaly or masses felt. Normal bowel sounds heard. Central nervous system: Alert and oriented. No focal neurological deficits. Extremities: No edema, no clubbing ,no cyanosis, distal peripheral pulses palpable. Skin: No rashes, lesions or ulcers,no icterus ,no pallor      Data Reviewed: I have personally reviewed following labs and imaging studies  CBC: Recent Labs  Lab 04/22/18 1311 04/23/18 0250 04/24/18 0803 04/25/18 0704 04/26/18 0457 04/27/18 0603  WBC 5.2 4.5 4.8 6.6 8.9 9.6  NEUTROABS 3.8  --   --   --  5.8 5.7  HGB 9.4* 9.1* 9.0* 8.4* 8.3* 8.5*  HCT 31.8* 31.4* 32.3* 28.4* 28.7* 29.9*  MCV 90.1 91.0 93.4 92.8 93.8 93.1  PLT 152 138* 138* 114* 104* 99*   Basic Metabolic Panel: Recent Labs  Lab 04/24/18 0803 04/25/18 0704 04/26/18 0457 04/27/18 0603 04/28/18 0720  NA 143 140 142 140 138  K 3.2* 4.3 4.3 4.7 4.5  CL 116* 115* 118* 115* 110  CO2 18* 18* 18* 20* 18*  GLUCOSE 112* 91 93 95 89  BUN 35* 33* 31* 30* 30*  CREATININE 1.82* 1.86* 1.68* 1.49* PENDING  CALCIUM 8.3* 8.1* 7.8* 8.1* 8.0*  MG 2.0  --   --   --   --    GFR: CrCl cannot be calculated (This lab value cannot be used to calculate CrCl because it is not a number: PENDING). Liver Function Tests: Recent Labs  Lab 04/22/18 1311  AST 20  ALT 10  ALKPHOS 52  BILITOT 1.0  PROT 6.5  ALBUMIN 3.5   Recent Labs  Lab 04/22/18 1311  LIPASE 23   No results for input(s): AMMONIA in the last 168 hours. Coagulation Profile: No results for input(s): INR, PROTIME in the last 168 hours. Cardiac Enzymes: No results for input(s): CKTOTAL, CKMB, CKMBINDEX, TROPONINI in the last 168 hours. BNP (last 3 results) No results for input(s): PROBNP in the last 8760 hours. HbA1C: No results for input(s): HGBA1C in the last 72 hours. CBG: No results for input(s): GLUCAP in the last 168 hours. Lipid Profile: No results for input(s): CHOL, HDL, LDLCALC, TRIG,  CHOLHDL, LDLDIRECT in the last 72 hours. Thyroid Function Tests: No results for input(s): TSH, T4TOTAL, FREET4, T3FREE, THYROIDAB in the last 72 hours. Anemia Panel: No results for input(s): VITAMINB12, FOLATE, FERRITIN, TIBC, IRON, RETICCTPCT in the last 72 hours. Sepsis Labs: No results for input(s): PROCALCITON, LATICACIDVEN in the last 168 hours.  Recent Results (from the past 240 hour(s))  C difficile quick scan w PCR reflex     Status: None   Collection Time: 04/22/18  1:12 PM  Result Value Ref Range Status   C Diff antigen NEGATIVE NEGATIVE Final   C Diff toxin NEGATIVE NEGATIVE Final   C Diff interpretation No C. difficile detected.  Final  Urine culture     Status: Abnormal   Collection Time: 04/22/18 11:50 PM  Result Value Ref Range Status   Specimen Description URINE, CLEAN CATCH  Final   Special Requests NONE  Final   Culture (A)  Final    50,000 COLONIES/mL AEROCOCCUS URINAE Standardized susceptibility  testing for this organism is not available. Performed at [facility], [address], [place], [place] [address]    Report Status 04/26/2018 FINAL  Final  Gastrointestinal Panel by PCR , Stool     Status: Abnormal   Collection Time: 04/23/18 10:32 PM  Result Value Ref Range Status   Campylobacter species NOT DETECTED NOT DETECTED Final   Plesimonas shigelloides NOT DETECTED NOT DETECTED Final   Salmonella species NOT DETECTED NOT DETECTED Final   Yersinia enterocolitica NOT DETECTED NOT DETECTED Final   Vibrio species NOT DETECTED NOT DETECTED Final   Vibrio cholerae NOT DETECTED NOT DETECTED Final   Enteroaggregative E coli (EAEC) NOT DETECTED NOT DETECTED Final   Enteropathogenic E coli (EPEC) NOT DETECTED NOT DETECTED Final   Enterotoxigenic E coli (ETEC) NOT DETECTED NOT DETECTED Final   Shiga like toxin producing E coli (STEC) NOT DETECTED NOT DETECTED Final   Shigella/Enteroinvasive E coli (EIEC) NOT DETECTED NOT DETECTED Final   Cryptosporidium NOT  DETECTED NOT DETECTED Final   Cyclospora cayetanensis NOT DETECTED NOT DETECTED Final   Entamoeba histolytica NOT DETECTED NOT DETECTED Final   Giardia lamblia NOT DETECTED NOT DETECTED Final   Adenovirus F40/41 NOT DETECTED NOT DETECTED Final   Astrovirus NOT DETECTED NOT DETECTED Final   Norovirus GI/GII DETECTED (A) NOT DETECTED Final    Comment: RESULT CALLED TO, READ BACK BY AND VERIFIED WITH:  NADIA HARVEY AT 1105 04/24/2018 SDR    Rotavirus A NOT DETECTED NOT DETECTED Final   Sapovirus (I, II, IV, and V) NOT DETECTED NOT DETECTED Final    Comment: Performed at [facility], [address] [place], [place] [address]  Respiratory Panel by PCR     Status: None   Collection Time: 04/24/18 12:47 PM  Result Value Ref Range Status   Adenovirus NOT DETECTED NOT DETECTED Final   Coronavirus 229E NOT DETECTED NOT DETECTED Final   Coronavirus HKU1 NOT DETECTED NOT DETECTED Final   Coronavirus NL63 NOT DETECTED NOT DETECTED Final   Coronavirus OC43 NOT DETECTED NOT DETECTED Final   Metapneumovirus NOT DETECTED NOT DETECTED Final   Rhinovirus / Enterovirus NOT DETECTED NOT DETECTED Final   Influenza A NOT DETECTED NOT DETECTED Final   Influenza B NOT DETECTED NOT DETECTED Final   Parainfluenza Virus 1 NOT DETECTED NOT DETECTED Final   Parainfluenza Virus 2 NOT DETECTED NOT DETECTED Final   Parainfluenza Virus 3 NOT DETECTED NOT DETECTED Final   Parainfluenza Virus 4 NOT DETECTED NOT DETECTED Final   Respiratory Syncytial Virus NOT DETECTED NOT DETECTED Final   Bordetella pertussis NOT DETECTED NOT DETECTED Final   Chlamydophila pneumoniae NOT DETECTED NOT DETECTED Final   Mycoplasma pneumoniae NOT DETECTED NOT DETECTED Final    Comment: Performed at [facility], [address], [place], [place] [address]         Radiology Studies: Dg Chest Port 1 View  Result Date: 04/27/2018 CLINICAL DATA:  Short of breath EXAM: PORTABLE CHEST 1 VIEW COMPARISON:  04/24/2018  FINDINGS: Cardiac enlargement. CABG and aortic valve replacement with TAVR. Atherosclerotic aorta Congestive heart failure with vascular congestion and mild edema. Progression of bibasilar atelectasis and small effusions since the prior study. IMPRESSION: Congestive heart failure with mild progression from the prior study. Electronically Signed   By: Marlan Palau M.D.   On: 04/27/2018 11:25        Scheduled Meds: . acetaminophen  1,000 mg Oral BID  . amLODipine  5 mg Oral  Daily  . aspirin EC  81 mg Oral Daily  . atorvastatin  20 mg Oral Daily  . carvedilol  6.25 mg Oral BID WC  . clopidogrel  75 mg Oral Daily  . dextromethorphan  15 mg Oral BID  . folic acid  3 mg Oral Daily  . Gerhardt's butt cream   Topical BID  . levothyroxine  88 mcg Oral Daily  . loperamide  2 mg Oral Q6H  . loratadine  10 mg Oral Daily  . methotrexate  25 mg Oral Weekly  . multivitamin with minerals  1 tablet Oral Daily  . pantoprazole  40 mg Oral Daily  . predniSONE  5 mg Oral Daily  . vitamin B-12  1,000 mcg Oral Daily   Continuous Infusions:    LOS: 4 days    Time spent: 35 mins.More than 50% of that time was spent in counseling and/or coordination of care.      Burnadette PopAmrit Avyan Livesay, MD [facility] Pager [phone]  If 7PM-7AM, please contact night-coverage [email or web address] Password [email or web address] 04/28/2018, 9:15 AM gd

## 2018-04-28 NOTE — Progress Notes (Signed)
Patient is assisted to the chair with two people, cream was applied to buttocks( between)  where he has the moisture associated skin damage.

## 2018-04-28 NOTE — Progress Notes (Signed)
Patient is cleaned and put back to bed, a foam was applied to his coccyx area to relieve some of the pressure.

## 2018-04-29 LAB — BASIC METABOLIC PANEL
Anion gap: 10 (ref 5–15)
BUN: 29 mg/dL — ABNORMAL HIGH (ref 8–23)
CO2: 19 mmol/L — AB (ref 22–32)
Calcium: 8.3 mg/dL — ABNORMAL LOW (ref 8.9–10.3)
Chloride: 109 mmol/L (ref 98–111)
Creatinine, Ser: 1.28 mg/dL — ABNORMAL HIGH (ref 0.61–1.24)
GFR calc non Af Amer: 50 mL/min — ABNORMAL LOW (ref >60)
GFR, EST AFRICAN AMERICAN: 58 mL/min — AB (ref >60)
Glucose, Bld: 104 mg/dL — ABNORMAL HIGH (ref 70–99)
Potassium: 4.8 mmol/L (ref 3.5–5.1)
Sodium: 138 mmol/L (ref 135–145)

## 2018-04-29 MED ORDER — FUROSEMIDE 40 MG PO TABS
40.0000 mg | ORAL_TABLET | Freq: Every day | ORAL | Status: DC
  Administered 2018-04-29 – 2018-04-30 (×2): 40 mg via ORAL
  Filled 2018-04-29 (×2): qty 1

## 2018-04-29 MED ORDER — QUETIAPINE FUMARATE 25 MG PO TABS
25.0000 mg | ORAL_TABLET | Freq: Every day | ORAL | Status: DC
  Administered 2018-04-29: 25 mg via ORAL
  Filled 2018-04-29: qty 1

## 2018-04-29 NOTE — Progress Notes (Signed)
Patient was cleaned, condom cath emptied, and he was reposition in bed. He is now in bed with no complains.

## 2018-04-29 NOTE — Progress Notes (Addendum)
PROGRESS NOTE    Hunter Choi  HKG:[id number] DOB: 02/17/1928 DOA: 04/22/2018 PCP: [name], MD   Brief Narrative: Patient is 85 year old male with past medical history of rheumatoid arthritis on methotrexate and prednisone, stroke, GERD, abdominal aortic aneurysm, BPH, hypothyroidism, coronary artery disease, chronic systolic heart failure with EF of 30%, paroxysmal A. fib, status post TAVR was recently hospitalized and was discharged to skilled facility but came back with complaints of multiple episodes of diarrhea, vomiting.  GI pathogen panel showed  NOROVIRUS.   Diarrhea has stopped but patient became confused today.  Assessment & Plan:   Principal Problem:   Nausea vomiting and diarrhea Active Problems:   Rheumatoid arthritis (HCC)   GERD (gastroesophageal reflux disease)   Thyroid disease   Coronary artery disease involving coronary bypass graft of native heart with angina pectoris (HCC)   S/P TAVR (transcatheter aortic valve replacement)   Essential hypertension   Hypothyroidism  Norovirus diarhoea: Presented with loose stools, nausea, vomiting.  Had a fever of 100.4 on presentation.  No leukocytosis.  Was on rectal tube for  profuse diarrhea.  No more diarrhoea.Rectal tube has been removed. Denies any abdominal pain.  IV fluids stopped.  Chronic systolic heart failure: Ejection fraction of 30%.  Continue lasix at home dose.Fluids stopped.  Acute kidney injury: Baseline creatinine around 1.5.  Creatinine now at baseline.  IV fluids stopped.  Cough:  Chest x-ray done earlier showed  pulmonary edema.  Given few doses of IV Lasix.  Started on home Lasix.  Currently respiratory status stable.  On room air.  Thrombocytopenia: Stable. On aspirin and Plavix from home.  Continue to monitor.  Lovenox discontinued.  History of rheumatoid arthritis: Methotrexate and prednisone.  Deformities of bilateral hands.  Hypothyroidism: Continue Synthyroid  History of  coronary disease: Status post CABG, status post TAVR .  On aspirin, Plavix, statin and Coreg  Hypertension: Losartan on hold.  Continue amlodipine.  Continue blood pressure monitoring.  Currently BO is stable  Deconditioning/debility: PT evaluated the patient and recommended HHPT.  Altered mental status/confusion: Patient disoriented this morning.  Was talking to himself.  Most likely hospital-acquired delirium.  Will continue low-dose Seroquel.  We will continue to monitor his mental status.  Keep the room bright  with day sunlight.          DVT prophylaxis:SCD Code Status: Full Family Communication: None present at the bedside.Called son twice again today,phone not received Disposition Plan: Home with HHPT tomorrow if mental status improves  Consultants: None  Procedures: None  Antimicrobials:  Anti-infectives (From admission, onward)   Start     Dose/Rate Route Frequency Ordered Stop   04/22/18 1530  cefTRIAXone (ROCEPHIN) 1 g in sodium chloride 0.9 % 100 mL IVPB     1 g 200 mL/hr over 30 Minutes Intravenous  Once 04/22/18 1523 04/22/18 1640      Subjective: Patient seen and examined the bedside this morning.  Hemodynamically stable.  Respiratory status looks improved.  But he was confused this morning and was talking to himself  Objective: Vitals:   04/28/18 0835 04/28/18 1227 04/28/18 1842 04/29/18 0500  BP: 116/63 (!) 104/55  135/81  Pulse:  62  74  Resp:  18  20  Temp:  98.7 F (37.1 C)  98.5 F (36.9 C)  TempSrc:  Oral  Oral  SpO2:  100%  94%  Weight:    70.9 kg  Height:   5\' 5"  (1.651 m)     Intake/Output Summary (Last  24 hours) at 04/29/2018 4098 Last data filed at 04/29/2018 0100 Gross per 24 hour  Intake 600 ml  Output 1000 ml  Net -400 ml   Filed Weights   04/27/18 0535 04/28/18 0512 04/29/18 0500  Weight: 70 kg 71 kg 70.9 kg    Examination:   General exam: Debilitated elderly male  HEENT:PERRL,Oral mucosa moist, Ear/Nose normal on gross  exam Respiratory system: Bilateral decreased air entry on the bases Cardiovascular system: S1 & S2 heard, RRR. No JVD, murmurs, rubs, gallops or clicks. Gastrointestinal system: Abdomen is nondistended, soft and nontender. No organomegaly or masses felt. Normal bowel sounds heard. Central nervous system: Alert but not oriented. No focal neurological deficits. Extremities: No edema, no clubbing ,no cyanosis, distal peripheral pulses palpable.  Deformities of the hands from rheumatoid arthritis Skin: No rashes, lesions or ulcers,no icterus ,no pallor   Data Reviewed: I have personally reviewed following labs and imaging studies  CBC: Recent Labs  Lab 04/22/18 1311 04/23/18 0250 04/24/18 0803 04/25/18 0704 04/26/18 0457 04/27/18 0603  WBC 5.2 4.5 4.8 6.6 8.9 9.6  NEUTROABS 3.8  --   --   --  5.8 5.7  HGB 9.4* 9.1* 9.0* 8.4* 8.3* 8.5*  HCT 31.8* 31.4* 32.3* 28.4* 28.7* 29.9*  MCV 90.1 91.0 93.4 92.8 93.8 93.1  PLT 152 138* 138* 114* 104* 99*   Basic Metabolic Panel: Recent Labs  Lab 04/24/18 0803 04/25/18 0704 04/26/18 0457 04/27/18 0603 04/28/18 0720 04/29/18 0704  NA 143 140 142 140 138 138  K 3.2* 4.3 4.3 4.7 4.5 4.8  CL 116* 115* 118* 115* 110 109  CO2 18* 18* 18* 20* 18* 19*  GLUCOSE 112* 91 93 95 89 104*  BUN 35* 33* 31* 30* 30* 29*  CREATININE 1.82* 1.86* 1.68* 1.49* 1.49* 1.28*  CALCIUM 8.3* 8.1* 7.8* 8.1* 8.0* 8.3*  MG 2.0  --   --   --   --   --    GFR: Estimated Creatinine Clearance: 34.7 mL/min (A) (by C-G formula based on SCr of 1.28 mg/dL (H)). Liver Function Tests: Recent Labs  Lab 04/22/18 1311  AST 20  ALT 10  ALKPHOS 52  BILITOT 1.0  PROT 6.5  ALBUMIN 3.5   Recent Labs  Lab 04/22/18 1311  LIPASE 23   No results for input(s): AMMONIA in the last 168 hours. Coagulation Profile: No results for input(s): INR, PROTIME in the last 168 hours. Cardiac Enzymes: No results for input(s): CKTOTAL, CKMB, CKMBINDEX, TROPONINI in the last 168  hours. BNP (last 3 results) No results for input(s): PROBNP in the last 8760 hours. HbA1C: No results for input(s): HGBA1C in the last 72 hours. CBG: No results for input(s): GLUCAP in the last 168 hours. Lipid Profile: No results for input(s): CHOL, HDL, LDLCALC, TRIG, CHOLHDL, LDLDIRECT in the last 72 hours. Thyroid Function Tests: No results for input(s): TSH, T4TOTAL, FREET4, T3FREE, THYROIDAB in the last 72 hours. Anemia Panel: No results for input(s): VITAMINB12, FOLATE, FERRITIN, TIBC, IRON, RETICCTPCT in the last 72 hours. Sepsis Labs: No results for input(s): PROCALCITON, LATICACIDVEN in the last 168 hours.  Recent Results (from the past 240 hour(s))  C difficile quick scan w PCR reflex     Status: None   Collection Time: 04/22/18  1:12 PM  Result Value Ref Range Status   C Diff antigen NEGATIVE NEGATIVE Final   C Diff toxin NEGATIVE NEGATIVE Final   C Diff interpretation No C. difficile detected.  Final  Urine culture  Status: Abnormal   Collection Time: 04/22/18 11:50 PM  Result Value Ref Range Status   Specimen Description URINE, CLEAN CATCH  Final   Special Requests NONE  Final   Culture (A)  Final    50,000 COLONIES/mL AEROCOCCUS URINAE Standardized susceptibility testing for this organism is not available. Performed at [facility], [address] [place], [place] [address]    Report Status 04/26/2018 FINAL  Final  Gastrointestinal Panel by PCR , Stool     Status: Abnormal   Collection Time: 04/23/18 10:32 PM  Result Value Ref Range Status   Campylobacter species NOT DETECTED NOT DETECTED Final   Plesimonas shigelloides NOT DETECTED NOT DETECTED Final   Salmonella species NOT DETECTED NOT DETECTED Final   Yersinia enterocolitica NOT DETECTED NOT DETECTED Final   Vibrio species NOT DETECTED NOT DETECTED Final   Vibrio cholerae NOT DETECTED NOT DETECTED Final   Enteroaggregative E coli (EAEC) NOT DETECTED NOT DETECTED Final   Enteropathogenic E coli  (EPEC) NOT DETECTED NOT DETECTED Final   Enterotoxigenic E coli (ETEC) NOT DETECTED NOT DETECTED Final   Shiga like toxin producing E coli (STEC) NOT DETECTED NOT DETECTED Final   Shigella/Enteroinvasive E coli (EIEC) NOT DETECTED NOT DETECTED Final   Cryptosporidium NOT DETECTED NOT DETECTED Final   Cyclospora cayetanensis NOT DETECTED NOT DETECTED Final   Entamoeba histolytica NOT DETECTED NOT DETECTED Final   Giardia lamblia NOT DETECTED NOT DETECTED Final   Adenovirus F40/41 NOT DETECTED NOT DETECTED Final   Astrovirus NOT DETECTED NOT DETECTED Final   Norovirus GI/GII DETECTED (A) NOT DETECTED Final    Comment: RESULT CALLED TO, READ BACK BY AND VERIFIED WITH:  NADIA HARVEY AT 1105 04/24/2018 SDR    Rotavirus A NOT DETECTED NOT DETECTED Final   Sapovirus (I, II, IV, and V) NOT DETECTED NOT DETECTED Final    Comment: Performed at [facility], [address] [place], [place] [address]  Respiratory Panel by PCR     Status: None   Collection Time: 04/24/18 12:47 PM  Result Value Ref Range Status   Adenovirus NOT DETECTED NOT DETECTED Final   Coronavirus 229E NOT DETECTED NOT DETECTED Final   Coronavirus HKU1 NOT DETECTED NOT DETECTED Final   Coronavirus NL63 NOT DETECTED NOT DETECTED Final   Coronavirus OC43 NOT DETECTED NOT DETECTED Final   Metapneumovirus NOT DETECTED NOT DETECTED Final   Rhinovirus / Enterovirus NOT DETECTED NOT DETECTED Final   Influenza A NOT DETECTED NOT DETECTED Final   Influenza B NOT DETECTED NOT DETECTED Final   Parainfluenza Virus 1 NOT DETECTED NOT DETECTED Final   Parainfluenza Virus 2 NOT DETECTED NOT DETECTED Final   Parainfluenza Virus 3 NOT DETECTED NOT DETECTED Final   Parainfluenza Virus 4 NOT DETECTED NOT DETECTED Final   Respiratory Syncytial Virus NOT DETECTED NOT DETECTED Final   Bordetella pertussis NOT DETECTED NOT DETECTED Final   Chlamydophila pneumoniae NOT DETECTED NOT DETECTED Final   Mycoplasma pneumoniae NOT DETECTED NOT  DETECTED Final    Comment: Performed at [facility], [address], [place], [place] [address]         Radiology Studies: Dg Chest Port 1 View  Result Date: 04/27/2018 CLINICAL DATA:  Short of breath EXAM: PORTABLE CHEST 1 VIEW COMPARISON:  04/24/2018 FINDINGS: Cardiac enlargement. CABG and aortic valve replacement with TAVR. Atherosclerotic aorta Congestive heart failure with vascular congestion and mild edema. Progression of bibasilar atelectasis and small effusions since the prior study. IMPRESSION: Congestive  heart failure with mild progression from the prior study. Electronically Signed   By: Marlan Palauharles  Clark M.D.   On: 04/27/2018 11:25        Scheduled Meds: . acetaminophen  1,000 mg Oral BID  . amLODipine  5 mg Oral Daily  . aspirin EC  81 mg Oral Daily  . atorvastatin  20 mg Oral Daily  . carvedilol  6.25 mg Oral BID WC  . clopidogrel  75 mg Oral Daily  . dextromethorphan  15 mg Oral BID  . folic acid  3 mg Oral Daily  . furosemide  40 mg Oral Daily  . Gerhardt's butt cream   Topical BID  . levothyroxine  88 mcg Oral Daily  . loperamide  2 mg Oral Q6H  . loratadine  10 mg Oral Daily  . methotrexate  25 mg Oral Weekly  . multivitamin with minerals  1 tablet Oral Daily  . pantoprazole  40 mg Oral Daily  . predniSONE  5 mg Oral Daily  . QUEtiapine  25 mg Oral QHS  . vitamin B-12  1,000 mcg Oral Daily   Continuous Infusions:    LOS: 5 days    Time spent: 35 mins.More than 50% of that time was spent in counseling and/or coordination of care.      Burnadette PopAmrit Jekhi Bolin, MD [facility] Pager [phone]  If 7PM-7AM, please contact night-coverage [email or web address] Password [email or web address] 04/29/2018, 8:32 AM gd

## 2018-04-29 NOTE — Progress Notes (Addendum)
Patient sat on the side of the bed for thirty minutes and was ready to go back in bed. He did not want to sit in the chair.

## 2018-04-30 NOTE — Discharge Summary (Signed)
Physician Discharge Summary  Hunter Choi VWU:[id number] DOB: Dec 15, 1927 DOA: 04/22/2018  PCP: [name], MD  Admit date: 04/22/2018 Discharge date: 04/30/2018  Admitted From: Home Disposition:  Home  Discharge Condition:Stable CODE STATUS:FULL Diet recommendation: Heart Healthy   Brief/Interim Summary:  Patient is 85 year old male with past medical history of rheumatoid arthritis on methotrexate and prednisone, stroke, GERD, abdominal aortic aneurysm, BPH, hypothyroidism, coronary artery disease, chronic systolic heart failure with EF of 30%, paroxysmal A. fib, status post TAVR was recently hospitalized and was discharged to skilled facility but came back with complaints of multiple episodes of diarrhea, vomiting.  GI pathogen panel showed  NOROVIRUS.   Diarrhea has stopped and he is stable for discharge today.  Following problems were addressed during his hospitalization:  Norovirus diarhoea: Presented with loose stools, nausea, vomiting.  Had a fever of 100.4 on presentation.  No leukocytosis.  Was on rectal tube for  profuse diarrhea.  No more diarrhoea.Rectal tube has been removed. Denies any abdominal pain.  IV fluids stopped.  Chronic systolic heart failure: Ejection fraction of 30%.  Continue lasix at home dose.Fluids stopped.  Acute kidney injury: Baseline creatinine around 1.5.  Creatinine now at baseline.  IV fluids stopped.  Cough:  Chest x-ray done earlier showed  pulmonary edema.  Given few doses of IV Lasix.  Started on home Lasix.  Currently respiratory status stable.  On room air.  Thrombocytopenia: Stable. On aspirin and Plavix from home.  Continue to monitor as an outpatient.  History of rheumatoid arthritis: Methotrexate and prednisone.  Deformities of bilateral hands.  Hypothyroidism: Continue Synthyroid  History of coronary disease: Status post CABG, status post TAVR .  On aspirin, Plavix, statin and Coreg  Hypertension: Resumed home  meds.  Deconditioning/debility: PT evaluated the patient and recommended HHPT.  Altered mental status/confusion:  Mental status  at baseline this morning.   Discharge Diagnoses:  Principal Problem:   Nausea vomiting and diarrhea Active Problems:   Rheumatoid arthritis (HCC)   GERD (gastroesophageal reflux disease)   Thyroid disease   Coronary artery disease involving coronary bypass graft of native heart with angina pectoris (HCC)   S/P TAVR (transcatheter aortic valve replacement)   Essential hypertension   Hypothyroidism    Discharge Instructions  Discharge Instructions    Diet - low sodium heart healthy   Complete by:  As directed    Discharge instructions   Complete by:  As directed    1) Follow up with your PCP in a week.  Do a CBC, BMP test during the follow-up   Increase activity slowly   Complete by:  As directed      Allergies as of 04/30/2018      Reactions   Ciprofloxacin Other (See Comments)   REACTION: mild delirium      Medication List    TAKE these medications   acetaminophen 500 MG tablet Commonly known as:  TYLENOL Take 1,000 mg by mouth 2 (two) times daily.   amLODipine 5 MG tablet Commonly known as:  NORVASC Take 5 mg by mouth daily.   aspirin 81 MG EC tablet Take 1 tablet (81 mg total) by mouth daily.   atorvastatin 20 MG tablet Commonly known as:  LIPITOR Take 20 mg by mouth daily.   carvedilol 6.25 MG tablet Commonly known as:  COREG Take 1 tablet (6.25 mg total) by mouth 2 (two) times daily with a meal.   clopidogrel 75 MG tablet Commonly known as:  PLAVIX TAKE 1 TABLET BY  MOUTH EVERY DAY   feeding supplement (ENSURE ENLIVE) Liqd Take 237 mLs by mouth 3 (three) times daily between meals.   feeding supplement (PRO-STAT SUGAR FREE 64) Liqd Take 30 mLs by mouth 2 (two) times daily.   folic acid 1 MG tablet Commonly known as:  FOLVITE Take 3 mg by mouth daily.   furosemide 20 MG tablet Commonly known as:  LASIX TAKE 2  TABLETS BY MOUTH ONE TIME A DAY What changed:  See the new instructions.   levothyroxine 88 MCG tablet Commonly known as:  SYNTHROID, LEVOTHROID Take 1 tablet (88 mcg total) by mouth daily.   losartan 25 MG tablet Commonly known as:  COZAAR Take 25 mg by mouth daily.   methotrexate 2.5 MG tablet Take 25 mg by mouth every Friday.   multivitamin with minerals Tabs tablet Take 1 tablet by mouth daily.   nitroGLYCERIN 0.4 MG SL tablet Commonly known as:  NITROSTAT PLACE 1 TABLET (0.4 MG TOTAL) UNDER THE TONGUE EVERY 5 MINUTES AS NEEDED FOR CHEST PAIN What changed:  See the new instructions.   pantoprazole 40 MG tablet Commonly known as:  PROTONIX Take 1 tablet (40 mg total) by mouth daily.   predniSONE 5 MG tablet Commonly known as:  DELTASONE Take 5 mg by mouth daily.   vitamin B-12 1000 MCG tablet Commonly known as:  CYANOCOBALAMIN Take 1 tablet (1,000 mcg total) by mouth daily.      Follow-up Information    Home, [facility] Follow up.   Specialty:  [facility] Contact information: [address] [place] [address] [phone]        Lorenda IshiharaVaradarajan, [name], MD. Schedule an appointment as soon as possible for a visit in 1 week(s).   Specialty:  Internal Medicine Contact information: [address] [place] [address] [phone]        Lyn RecordsSmith, Henry W, MD .   Specialty:  Cardiology Contact information: [phone] [address] [place] [address] [phone]          Allergies  Allergen Reactions  . Ciprofloxacin Other (See Comments)    REACTION: mild delirium    Consultations:  None   Procedures/Studies: Dg Chest 2 View  Result Date: 04/24/2018 CLINICAL DATA:  Shortness of breath, cough. EXAM: CHEST - 2 VIEW COMPARISON:  Radiographs of December 09, 2017. FINDINGS: Stable cardiomegaly with central pulmonary vascular congestion. Atherosclerosis of thoracic aorta is noted. Status post coronary  artery bypass graft and aortic valve repair. No pneumothorax is noted. No significant pleural effusion is noted. Some degree of pulmonary edema can not be excluded. Bony thorax is unremarkable. IMPRESSION: Stable cardiomegaly with central pulmonary vascular congestion and possible pulmonary edema. Aortic Atherosclerosis (ICD10-I70.0). Electronically Signed   By: Lupita RaiderJames  Green Jr, M.D.   On: 04/24/2018 11:18   Dg Chest Port 1 View  Result Date: 04/27/2018 CLINICAL DATA:  Short of breath EXAM: PORTABLE CHEST 1 VIEW COMPARISON:  04/24/2018 FINDINGS: Cardiac enlargement. CABG and aortic valve replacement with TAVR. Atherosclerotic aorta Congestive heart failure with vascular congestion and mild edema. Progression of bibasilar atelectasis and small effusions since the prior study. IMPRESSION: Congestive heart failure with mild progression from the prior study. Electronically Signed   By: Marlan Palauharles  Clark M.D.   On: 04/27/2018 11:25   Dg Abd Portable 1v  Result Date: 04/23/2018 CLINICAL DATA:  Vomiting. EXAM: PORTABLE ABDOMEN - 1 VIEW COMPARISON:  CT chest, abdomen and pelvis 08/03/2017. FINDINGS: The stomach is  distended. Small and large bowel loops are unremarkable. Large rounded calcifications in the pelvis are consistent with distal abdominal and bilateral iliac artery aneurysms as seen on the prior CT. IMPRESSION: Distended stomach. Negative for bowel obstruction. Calcified abdominal aortic and bilateral iliac artery aneurysms. Electronically Signed   By: Drusilla Kanner M.D.   On: 04/23/2018 10:36      Subjective: Patient seen and examined at bedside this morning.  Remains comfortable.  Hemodynamically stable.  Stable for discharge.  Discharge Exam: Vitals:   04/30/18 0612 04/30/18 0945  BP: (!) 104/52 (!) 105/56  Pulse: 61   Resp: 18   Temp: 97.8 F (36.6 C)   SpO2: 98%    Vitals:   04/29/18 1348 04/29/18 2103 04/30/18 0612 04/30/18 0945  BP: (!) 123/58 (!) 124/57 (!) 104/52 (!) 105/56   Pulse: 69 72 61   Resp: Temp: 98 F (36.7 C) 97.8 F (36.6 C) 97.8 F (36.6 C)   TempSrc: Oral Oral Oral   SpO2: 99% 97% 98%   Weight:   69.8 kg   Height:        General: Pt is alert, awake, not in acute distress Cardiovascular: RRR, S1/S2 +, no rubs, no gallops Respiratory: CTA bilaterally, no wheezing, no rhonchi Abdominal: Soft, NT, ND, bowel sounds + Extremities: no edema, no cyanosis    The results of significant diagnostics from this hospitalization (including imaging, microbiology, ancillary and laboratory) are listed below for reference.     Microbiology: Recent Results (from the past 240 hour(s))  C difficile quick scan w PCR reflex     Status: None   Collection Time: 04/22/18  1:12 PM  Result Value Ref Range Status   C Diff antigen NEGATIVE NEGATIVE Final   C Diff toxin NEGATIVE NEGATIVE Final   C Diff interpretation No C. difficile detected.  Final  Urine culture     Status: Abnormal   Collection Time: 04/22/18 11:50 PM  Result Value Ref Range Status   Specimen Description URINE, CLEAN CATCH  Final   Special Requests NONE  Final   Culture (A)  Final    50,000 COLONIES/mL AEROCOCCUS URINAE Standardized susceptibility testing for this organism is not available. Performed at [facility], [address] [place], [place] [address]    Report Status 04/26/2018 FINAL  Final  Gastrointestinal Panel by PCR , Stool     Status: Abnormal   Collection Time: 04/23/18 10:32 PM  Result Value Ref Range Status   Campylobacter species NOT DETECTED NOT DETECTED Final   Plesimonas shigelloides NOT DETECTED NOT DETECTED Final   Salmonella species NOT DETECTED NOT DETECTED Final   Yersinia enterocolitica NOT DETECTED NOT DETECTED Final   Vibrio species NOT DETECTED NOT DETECTED Final   Vibrio cholerae NOT DETECTED NOT DETECTED Final   Enteroaggregative E coli (EAEC) NOT DETECTED NOT DETECTED Final   Enteropathogenic E coli (EPEC) NOT DETECTED NOT DETECTED  Final   Enterotoxigenic E coli (ETEC) NOT DETECTED NOT DETECTED Final   Shiga like toxin producing E coli (STEC) NOT DETECTED NOT DETECTED Final   Shigella/Enteroinvasive E coli (EIEC) NOT DETECTED NOT DETECTED Final   Cryptosporidium NOT DETECTED NOT DETECTED Final   Cyclospora cayetanensis NOT DETECTED NOT DETECTED Final   Entamoeba histolytica NOT DETECTED NOT DETECTED Final   Giardia lamblia NOT DETECTED NOT DETECTED Final   Adenovirus F40/41 NOT DETECTED NOT DETECTED Final   Astrovirus NOT DETECTED NOT DETECTED Final   Norovirus GI/GII DETECTED (  A) NOT DETECTED Final    Comment: RESULT CALLED TO, READ BACK BY AND VERIFIED WITH:  NADIA HARVEY AT 1105 04/24/2018 SDR    Rotavirus A NOT DETECTED NOT DETECTED Final   Sapovirus (I, II, IV, and V) NOT DETECTED NOT DETECTED Final    Comment: Performed at [facility], [address] [place], [place] [address]  Respiratory Panel by PCR     Status: None   Collection Time: 04/24/18 12:47 PM  Result Value Ref Range Status   Adenovirus NOT DETECTED NOT DETECTED Final   Coronavirus 229E NOT DETECTED NOT DETECTED Final   Coronavirus HKU1 NOT DETECTED NOT DETECTED Final   Coronavirus NL63 NOT DETECTED NOT DETECTED Final   Coronavirus OC43 NOT DETECTED NOT DETECTED Final   Metapneumovirus NOT DETECTED NOT DETECTED Final   Rhinovirus / Enterovirus NOT DETECTED NOT DETECTED Final   Influenza A NOT DETECTED NOT DETECTED Final   Influenza B NOT DETECTED NOT DETECTED Final   Parainfluenza Virus 1 NOT DETECTED NOT DETECTED Final   Parainfluenza Virus 2 NOT DETECTED NOT DETECTED Final   Parainfluenza Virus 3 NOT DETECTED NOT DETECTED Final   Parainfluenza Virus 4 NOT DETECTED NOT DETECTED Final   Respiratory Syncytial Virus NOT DETECTED NOT DETECTED Final   Bordetella pertussis NOT DETECTED NOT DETECTED Final   Chlamydophila pneumoniae NOT DETECTED NOT DETECTED Final   Mycoplasma pneumoniae NOT DETECTED NOT DETECTED Final    Comment:  Performed at [facility], [address], [place], [place] [address]     Labs: BNP (last 3 results) Recent Labs    08/03/17 2010 12/09/17 1300 03/22/18 0359  BNP 2,896.1* 2,836.3* 1,508.9*   Basic Metabolic Panel: Recent Labs  Lab 04/24/18 0803 04/25/18 0981 04/26/18 0457 04/27/18 0603 04/28/18 0720 04/29/18 0704  NA 143 140 142 140 138 138  K 3.2* 4.3 4.3 4.7 4.5 4.8  CL 116* 115* 118* 115* 110 109  CO2 18* 18* 18* 20* 18* 19*  GLUCOSE 112* 91 93 95 89 104*  BUN 35* 33* 31* 30* 30* 29*  CREATININE 1.82* 1.86* 1.68* 1.49* 1.49* 1.28*  CALCIUM 8.3* 8.1* 7.8* 8.1* 8.0* 8.3*  MG 2.0  --   --   --   --   --    Liver Function Tests: No results for input(s): AST, ALT, ALKPHOS, BILITOT, PROT, ALBUMIN in the last 168 hours. No results for input(s): LIPASE, AMYLASE in the last 168 hours. No results for input(s): AMMONIA in the last 168 hours. CBC: Recent Labs  Lab 04/24/18 0803 04/25/18 0704 04/26/18 0457 04/27/18 0603  WBC 4.8 6.6 8.9 9.6  NEUTROABS  --   --  5.8 5.7  HGB 9.0* 8.4* 8.3* 8.5*  HCT 32.3* 28.4* 28.7* 29.9*  MCV 93.4 92.8 93.8 93.1  PLT 138* 114* 104* 99*   Cardiac Enzymes: No results for input(s): CKTOTAL, CKMB, CKMBINDEX, TROPONINI in the last 168 hours. BNP: Invalid input(s): POCBNP CBG: No results for input(s): GLUCAP in the last 168 hours. D-Dimer No results for input(s): DDIMER in the last 72 hours. Hgb A1c No results for input(s): HGBA1C in the last 72 hours. Lipid Profile No results for input(s): CHOL, HDL, LDLCALC, TRIG, CHOLHDL, LDLDIRECT in the last 72 hours. Thyroid function studies No results for input(s): TSH, T4TOTAL, T3FREE, THYROIDAB in the last 72 hours.  Invalid input(s): FREET3 Anemia work up No results for input(s): VITAMINB12, FOLATE, FERRITIN, TIBC, IRON, RETICCTPCT in the last 72 hours. Urinalysis    Component Value  Date/Time   COLORURINE YELLOW 04/22/2018 1500   APPEARANCEUR HAZY (A) 04/22/2018 1500   LABSPEC  1.014 04/22/2018 1500   PHURINE 5.0 04/22/2018 1500   GLUCOSEU NEGATIVE 04/22/2018 1500   HGBUR MODERATE (A) 04/22/2018 1500   BILIRUBINUR NEGATIVE 04/22/2018 1500   KETONESUR NEGATIVE 04/22/2018 1500   PROTEINUR 30 (A) 04/22/2018 1500   UROBILINOGEN 4.0 (H) 11/14/2014 1849   NITRITE NEGATIVE 04/22/2018 1500   LEUKOCYTESUR MODERATE (A) 04/22/2018 1500   Sepsis Labs Invalid input(s): PROCALCITONIN,  WBC,  LACTICIDVEN Microbiology Recent Results (from the past 240 hour(s))  C difficile quick scan w PCR reflex     Status: None   Collection Time: 04/22/18  1:12 PM  Result Value Ref Range Status   C Diff antigen NEGATIVE NEGATIVE Final   C Diff toxin NEGATIVE NEGATIVE Final   C Diff interpretation No C. difficile detected.  Final  Urine culture     Status: Abnormal   Collection Time: 04/22/18 11:50 PM  Result Value Ref Range Status   Specimen Description URINE, CLEAN CATCH  Final   Special Requests NONE  Final   Culture (A)  Final    50,000 COLONIES/mL AEROCOCCUS URINAE Standardized susceptibility testing for this organism is not available. Performed at [facility], [address] [place], [place] [address]    Report Status 04/26/2018 FINAL  Final  Gastrointestinal Panel by PCR , Stool     Status: Abnormal   Collection Time: 04/23/18 10:32 PM  Result Value Ref Range Status   Campylobacter species NOT DETECTED NOT DETECTED Final   Plesimonas shigelloides NOT DETECTED NOT DETECTED Final   Salmonella species NOT DETECTED NOT DETECTED Final   Yersinia enterocolitica NOT DETECTED NOT DETECTED Final   Vibrio species NOT DETECTED NOT DETECTED Final   Vibrio cholerae NOT DETECTED NOT DETECTED Final   Enteroaggregative E coli (EAEC) NOT DETECTED NOT DETECTED Final   Enteropathogenic E coli (EPEC) NOT DETECTED NOT DETECTED Final   Enterotoxigenic E coli (ETEC) NOT DETECTED NOT DETECTED Final   Shiga like toxin producing E coli (STEC) NOT DETECTED NOT DETECTED Final    Shigella/Enteroinvasive E coli (EIEC) NOT DETECTED NOT DETECTED Final   Cryptosporidium NOT DETECTED NOT DETECTED Final   Cyclospora cayetanensis NOT DETECTED NOT DETECTED Final   Entamoeba histolytica NOT DETECTED NOT DETECTED Final   Giardia lamblia NOT DETECTED NOT DETECTED Final   Adenovirus F40/41 NOT DETECTED NOT DETECTED Final   Astrovirus NOT DETECTED NOT DETECTED Final   Norovirus GI/GII DETECTED (A) NOT DETECTED Final    Comment: RESULT CALLED TO, READ BACK BY AND VERIFIED WITH:  NADIA HARVEY AT 1105 04/24/2018 SDR    Rotavirus A NOT DETECTED NOT DETECTED Final   Sapovirus (I, II, IV, and V) NOT DETECTED NOT DETECTED Final    Comment: Performed at [facility], [address] [place], [place] [address]  Respiratory Panel by PCR     Status: None   Collection Time: 04/24/18 12:47 PM  Result Value Ref Range Status   Adenovirus NOT DETECTED NOT DETECTED Final   Coronavirus 229E NOT DETECTED NOT DETECTED Final   Coronavirus HKU1 NOT DETECTED NOT DETECTED Final   Coronavirus NL63 NOT DETECTED NOT DETECTED Final   Coronavirus OC43 NOT DETECTED NOT DETECTED Final   Metapneumovirus NOT DETECTED NOT DETECTED Final   Rhinovirus / Enterovirus NOT DETECTED NOT DETECTED Final   Influenza A NOT DETECTED NOT DETECTED Final   Influenza B NOT DETECTED NOT DETECTED Final  Parainfluenza Virus 1 NOT DETECTED NOT DETECTED Final   Parainfluenza Virus 2 NOT DETECTED NOT DETECTED Final   Parainfluenza Virus 3 NOT DETECTED NOT DETECTED Final   Parainfluenza Virus 4 NOT DETECTED NOT DETECTED Final   Respiratory Syncytial Virus NOT DETECTED NOT DETECTED Final   Bordetella pertussis NOT DETECTED NOT DETECTED Final   Chlamydophila pneumoniae NOT DETECTED NOT DETECTED Final   Mycoplasma pneumoniae NOT DETECTED NOT DETECTED Final    Comment: Performed at [facility], [address] [place], [place] [address]    Please note: You were cared for by a hospitalist during your hospital  stay. Once you are discharged, your primary care physician will handle any further medical issues. Please note that NO REFILLS for any discharge medications will be authorized once you are discharged, as it is imperative that you return to your primary care physician (or establish a relationship with a primary care physician if you do not have one) for your post hospital discharge needs so that they can reassess your need for medications and monitor your lab values.    Time coordinating discharge: 40 minutes  SIGNED:   Burnadette PopAmrit Jaydn Moscato, MD  [facility] 04/30/2018, 9:52 AM Pager [phone]  If 7PM-7AM, please contact night-coverage [email or web address] Password [email or web address]

## 2018-04-30 NOTE — Consult Note (Signed)
WOC Nurse wound consult note Reason for Consult: MASD scrotum  Wound type: none  No issues with MASD noted today at the time of my assessment. Using pouch to contain urine which may have allowed the skin to improve since his admission.  Barrier cream can continue to be used to protect the skin.   Discussed POC with patient and bedside nurse.  Re consult if needed, will not follow at this time. Thanks  Zita Ozimek [employer], RN,CWOCN, CNS, CWON-AP [phone])

## 2018-05-12 ENCOUNTER — Emergency Department (HOSPITAL_COMMUNITY): Payer: Medicare Other

## 2018-05-12 ENCOUNTER — Inpatient Hospital Stay (HOSPITAL_COMMUNITY)
Admission: EM | Admit: 2018-05-12 | Discharge: 2018-05-17 | DRG: 291 | Disposition: A | Discharge status: Home Health | Payer: Medicare Other | Attending: Internal Medicine | Admitting: Internal Medicine

## 2018-05-12 ENCOUNTER — Encounter (HOSPITAL_COMMUNITY): Payer: Self-pay

## 2018-05-12 DIAGNOSIS — I5023 Acute on chronic systolic (congestive) heart failure: Secondary | ICD-10-CM | POA: Diagnosis present

## 2018-05-12 DIAGNOSIS — M069 Rheumatoid arthritis, unspecified: Secondary | ICD-10-CM | POA: Diagnosis present

## 2018-05-12 DIAGNOSIS — L89151 Pressure ulcer of sacral region, stage 1: Secondary | ICD-10-CM | POA: Diagnosis present

## 2018-05-12 DIAGNOSIS — K219 Gastro-esophageal reflux disease without esophagitis: Secondary | ICD-10-CM | POA: Diagnosis present

## 2018-05-12 DIAGNOSIS — I361 Nonrheumatic tricuspid (valve) insufficiency: Secondary | ICD-10-CM | POA: Diagnosis not present

## 2018-05-12 DIAGNOSIS — I69391 Dysphagia following cerebral infarction: Secondary | ICD-10-CM | POA: Diagnosis not present

## 2018-05-12 DIAGNOSIS — I714 Abdominal aortic aneurysm, without rupture: Secondary | ICD-10-CM | POA: Diagnosis present

## 2018-05-12 DIAGNOSIS — I723 Aneurysm of iliac artery: Secondary | ICD-10-CM | POA: Diagnosis present

## 2018-05-12 DIAGNOSIS — I130 Hypertensive heart and chronic kidney disease with heart failure and stage 1 through stage 4 chronic kidney disease, or unspecified chronic kidney disease: Principal | ICD-10-CM | POA: Diagnosis present

## 2018-05-12 DIAGNOSIS — Z7951 Long term (current) use of inhaled steroids: Secondary | ICD-10-CM

## 2018-05-12 DIAGNOSIS — Z7982 Long term (current) use of aspirin: Secondary | ICD-10-CM | POA: Diagnosis not present

## 2018-05-12 DIAGNOSIS — N183 Chronic kidney disease, stage 3 (moderate): Secondary | ICD-10-CM | POA: Diagnosis present

## 2018-05-12 DIAGNOSIS — G9341 Metabolic encephalopathy: Secondary | ICD-10-CM | POA: Diagnosis present

## 2018-05-12 DIAGNOSIS — N400 Enlarged prostate without lower urinary tract symptoms: Secondary | ICD-10-CM | POA: Diagnosis present

## 2018-05-12 DIAGNOSIS — I340 Nonrheumatic mitral (valve) insufficiency: Secondary | ICD-10-CM | POA: Diagnosis not present

## 2018-05-12 DIAGNOSIS — D631 Anemia in chronic kidney disease: Secondary | ICD-10-CM | POA: Diagnosis present

## 2018-05-12 DIAGNOSIS — Z8249 Family history of ischemic heart disease and other diseases of the circulatory system: Secondary | ICD-10-CM | POA: Diagnosis not present

## 2018-05-12 DIAGNOSIS — I509 Heart failure, unspecified: Secondary | ICD-10-CM | POA: Diagnosis not present

## 2018-05-12 DIAGNOSIS — I25709 Atherosclerosis of coronary artery bypass graft(s), unspecified, with unspecified angina pectoris: Secondary | ICD-10-CM | POA: Diagnosis present

## 2018-05-12 DIAGNOSIS — E039 Hypothyroidism, unspecified: Secondary | ICD-10-CM | POA: Diagnosis present

## 2018-05-12 DIAGNOSIS — H5462 Unqualified visual loss, left eye, normal vision right eye: Secondary | ICD-10-CM | POA: Diagnosis present

## 2018-05-12 DIAGNOSIS — H9190 Unspecified hearing loss, unspecified ear: Secondary | ICD-10-CM | POA: Diagnosis present

## 2018-05-12 DIAGNOSIS — E079 Disorder of thyroid, unspecified: Secondary | ICD-10-CM | POA: Diagnosis present

## 2018-05-12 DIAGNOSIS — Z79899 Other long term (current) drug therapy: Secondary | ICD-10-CM

## 2018-05-12 DIAGNOSIS — Z87891 Personal history of nicotine dependence: Secondary | ICD-10-CM

## 2018-05-12 DIAGNOSIS — I480 Paroxysmal atrial fibrillation: Secondary | ICD-10-CM | POA: Diagnosis present

## 2018-05-12 DIAGNOSIS — Z7952 Long term (current) use of systemic steroids: Secondary | ICD-10-CM

## 2018-05-12 DIAGNOSIS — R05 Cough: Secondary | ICD-10-CM

## 2018-05-12 DIAGNOSIS — Z953 Presence of xenogenic heart valve: Secondary | ICD-10-CM

## 2018-05-12 DIAGNOSIS — R0602 Shortness of breath: Secondary | ICD-10-CM

## 2018-05-12 DIAGNOSIS — Z8349 Family history of other endocrine, nutritional and metabolic diseases: Secondary | ICD-10-CM

## 2018-05-12 DIAGNOSIS — R059 Cough, unspecified: Secondary | ICD-10-CM

## 2018-05-12 DIAGNOSIS — R4182 Altered mental status, unspecified: Secondary | ICD-10-CM

## 2018-05-12 DIAGNOSIS — E785 Hyperlipidemia, unspecified: Secondary | ICD-10-CM | POA: Diagnosis present

## 2018-05-12 DIAGNOSIS — Z79890 Hormone replacement therapy (postmenopausal): Secondary | ICD-10-CM

## 2018-05-12 LAB — URINALYSIS, ROUTINE W REFLEX MICROSCOPIC
Bilirubin Urine: NEGATIVE
Glucose, UA: NEGATIVE mg/dL
Hgb urine dipstick: NEGATIVE
Ketones, ur: NEGATIVE mg/dL
Leukocytes,Ua: NEGATIVE
NITRITE: NEGATIVE
PROTEIN: NEGATIVE mg/dL
Specific Gravity, Urine: 1.009 (ref 1.005–1.030)
pH: 7 (ref 5.0–8.0)

## 2018-05-12 LAB — POCT I-STAT EG7
Acid-Base Excess: 5 mmol/L — ABNORMAL HIGH (ref 0.0–2.0)
BICARBONATE: 29.7 mmol/L — AB (ref 20.0–28.0)
Calcium, Ion: 1.06 mmol/L — ABNORMAL LOW (ref 1.15–1.40)
HCT: 29 % — ABNORMAL LOW (ref 39.0–52.0)
Hemoglobin: 9.9 g/dL — ABNORMAL LOW (ref 13.0–17.0)
O2 Saturation: 62 %
PCO2 VEN: 43.8 mmHg — AB (ref 44.0–60.0)
PO2 VEN: 31 mmHg — AB (ref 32.0–45.0)
Potassium: 4.4 mmol/L (ref 3.5–5.1)
Sodium: 136 mmol/L (ref 135–145)
TCO2: 31 mmol/L (ref 22–32)
pH, Ven: 7.44 — ABNORMAL HIGH (ref 7.250–7.430)

## 2018-05-12 LAB — COMPREHENSIVE METABOLIC PANEL
ALT: 13 U/L (ref 0–44)
AST: 24 U/L (ref 15–41)
Albumin: 3.4 g/dL — ABNORMAL LOW (ref 3.5–5.0)
Alkaline Phosphatase: 62 U/L (ref 38–126)
Anion gap: 12 (ref 5–15)
BUN: 26 mg/dL — ABNORMAL HIGH (ref 8–23)
CO2: 26 mmol/L (ref 22–32)
Calcium: 8.5 mg/dL — ABNORMAL LOW (ref 8.9–10.3)
Chloride: 97 mmol/L — ABNORMAL LOW (ref 98–111)
Creatinine, Ser: 1.5 mg/dL — ABNORMAL HIGH (ref 0.61–1.24)
GFR calc Af Amer: 47 mL/min — ABNORMAL LOW (ref >60)
GFR calc non Af Amer: 41 mL/min — ABNORMAL LOW (ref >60)
Glucose, Bld: 132 mg/dL — ABNORMAL HIGH (ref 70–99)
POTASSIUM: 4.4 mmol/L (ref 3.5–5.1)
Sodium: 135 mmol/L (ref 135–145)
Total Bilirubin: 0.7 mg/dL (ref 0.3–1.2)
Total Protein: 6.8 g/dL (ref 6.5–8.1)

## 2018-05-12 LAB — RAPID URINE DRUG SCREEN, HOSP PERFORMED
AMPHETAMINES: NOT DETECTED
Barbiturates: NOT DETECTED
Benzodiazepines: NOT DETECTED
Cocaine: NOT DETECTED
Opiates: NOT DETECTED
Tetrahydrocannabinol: NOT DETECTED

## 2018-05-12 LAB — CBC
HCT: 31.2 % — ABNORMAL LOW (ref 39.0–52.0)
HEMOGLOBIN: 9.2 g/dL — AB (ref 13.0–17.0)
MCH: 27.4 pg (ref 26.0–34.0)
MCHC: 29.5 g/dL — ABNORMAL LOW (ref 30.0–36.0)
MCV: 92.9 fL (ref 80.0–100.0)
Platelets: 303 10*3/uL (ref 150–400)
RBC: 3.36 MIL/uL — ABNORMAL LOW (ref 4.22–5.81)
RDW: 18.6 % — ABNORMAL HIGH (ref 11.5–15.5)
WBC: 10.6 10*3/uL — ABNORMAL HIGH (ref 4.0–10.5)
nRBC: 0 % (ref 0.0–0.2)

## 2018-05-12 LAB — ETHANOL: Alcohol, Ethyl (B): <10 mg/dL (ref <10)

## 2018-05-12 LAB — BRAIN NATRIURETIC PEPTIDE: B Natriuretic Peptide: >4500 pg/mL — ABNORMAL HIGH (ref 0.0–100.0)

## 2018-05-12 LAB — INFLUENZA PANEL BY PCR (TYPE A & B)
Influenza A By PCR: NEGATIVE
Influenza B By PCR: NEGATIVE

## 2018-05-12 LAB — TSH: TSH: 15.166 u[IU]/mL — ABNORMAL HIGH (ref 0.350–4.500)

## 2018-05-12 LAB — CBG MONITORING, ED: Glucose-Capillary: 125 mg/dL — ABNORMAL HIGH (ref 70–99)

## 2018-05-12 LAB — AMMONIA: Ammonia: 11 umol/L (ref 9–35)

## 2018-05-12 MED ORDER — ENSURE ENLIVE PO LIQD: 237.0000 mL | Freq: Three times a day (TID) | ORAL | Status: DC

## 2018-05-12 MED ORDER — ADULT MULTIVITAMIN W/MINERALS CH: 1.0000 | ORAL_TABLET | Freq: Every day | ORAL | Status: DC

## 2018-05-12 MED ORDER — CLOPIDOGREL BISULFATE 75 MG PO TABS
75.0000 mg | ORAL_TABLET | Freq: Every day | ORAL | Status: DC
  Administered 2018-05-13 – 2018-05-17 (×5): 75 mg via ORAL
  Filled 2018-05-12 (×5): qty 1

## 2018-05-12 MED ORDER — FUROSEMIDE 10 MG/ML IJ SOLN
40.0000 mg | Freq: Once | INTRAMUSCULAR | Status: AC
  Administered 2018-05-12: 40 mg via INTRAVENOUS
  Filled 2018-05-12: qty 4

## 2018-05-12 MED ORDER — LEVOTHYROXINE SODIUM 100 MCG PO TABS
100.0000 ug | ORAL_TABLET | Freq: Every day | ORAL | Status: DC
  Administered 2018-05-13: 100 ug via ORAL
  Filled 2018-05-12: qty 1

## 2018-05-12 MED ORDER — VITAMIN B-12 1000 MCG PO TABS
1000.0000 ug | ORAL_TABLET | Freq: Every day | ORAL | Status: DC
  Administered 2018-05-13 – 2018-05-17 (×5): 1000 ug via ORAL
  Filled 2018-05-12 (×5): qty 1

## 2018-05-12 MED ORDER — FLUTICASONE PROPIONATE 50 MCG/ACT NA SUSP
1.0000 | Freq: Every evening | NASAL | Status: DC | PRN
  Filled 2018-05-12: qty 16

## 2018-05-12 MED ORDER — CARVEDILOL 6.25 MG PO TABS: 6.2500 mg | ORAL_TABLET | Freq: Two times a day (BID) | ORAL | Status: DC

## 2018-05-12 MED ORDER — ACETAMINOPHEN 500 MG PO TABS
1000.0000 mg | ORAL_TABLET | Freq: Two times a day (BID) | ORAL | Status: DC
  Administered 2018-05-12: 1000 mg via ORAL
  Filled 2018-05-12: qty 2

## 2018-05-12 MED ORDER — IPRATROPIUM-ALBUTEROL 0.5-2.5 (3) MG/3ML IN SOLN: 3.0000 mL | Freq: Four times a day (QID) | RESPIRATORY_TRACT | Status: DC

## 2018-05-12 MED ORDER — PRO-STAT SUGAR FREE PO LIQD: 30.0000 mL | Freq: Two times a day (BID) | ORAL | Status: DC

## 2018-05-12 MED ORDER — ENOXAPARIN SODIUM 30 MG/0.3ML ~~LOC~~ SOLN
30.0000 mg | SUBCUTANEOUS | Status: DC
  Administered 2018-05-12 – 2018-05-15 (×4): 30 mg via SUBCUTANEOUS
  Filled 2018-05-12 (×4): qty 0.3

## 2018-05-12 MED ORDER — FOLIC ACID 1 MG PO TABS: 3.0000 mg | ORAL_TABLET | Freq: Every day | ORAL | Status: DC

## 2018-05-12 MED ORDER — METHOTREXATE 2.5 MG PO TABS: 25.0000 mg | ORAL_TABLET | ORAL | Status: DC

## 2018-05-12 MED ORDER — ATORVASTATIN CALCIUM 10 MG PO TABS: 20.0000 mg | ORAL_TABLET | Freq: Every day | ORAL | Status: DC

## 2018-05-12 MED ORDER — PREDNISONE 5 MG PO TABS
5.0000 mg | ORAL_TABLET | Freq: Every day | ORAL | Status: DC
  Administered 2018-05-13 – 2018-05-17 (×5): 5 mg via ORAL
  Filled 2018-05-12 (×5): qty 1

## 2018-05-12 MED ORDER — ASPIRIN EC 81 MG PO TBEC
81.0000 mg | DELAYED_RELEASE_TABLET | Freq: Every day | ORAL | Status: DC
  Administered 2018-05-13 – 2018-05-17 (×5): 81 mg via ORAL
  Filled 2018-05-12 (×5): qty 1

## 2018-05-12 MED ORDER — IPRATROPIUM-ALBUTEROL 0.5-2.5 (3) MG/3ML IN SOLN
3.0000 mL | Freq: Once | RESPIRATORY_TRACT | Status: AC
  Administered 2018-05-12: 3 mL via RESPIRATORY_TRACT
  Filled 2018-05-12: qty 3

## 2018-05-12 MED ORDER — SODIUM CHLORIDE 0.9% FLUSH: 3.0000 mL | Freq: Once | INTRAVENOUS | Status: DC

## 2018-05-12 NOTE — ED Notes (Signed)
I-STAT 7 Venous abnormal PO2 results of 31 reported to PA, Mamou.

## 2018-05-12 NOTE — ED Notes (Signed)
Patient transported to X-ray 

## 2018-05-12 NOTE — ED Notes (Signed)
Pt is having episodes of bradycardia into the 40's that is unsustained. PA made aware by this RN.

## 2018-05-12 NOTE — ED Notes (Signed)
Pt has condom cath on and is incontinent

## 2018-05-12 NOTE — ED Triage Notes (Signed)
Per family pt has become more altered and weak over the past few days along with slurred speech, pt was recently d.c for a viral infection. Pt alert and able to answer questions appropriately.

## 2018-05-12 NOTE — H&P (Addendum)
History and Physical    Hunter Choi UJW:[id number] DOB: 12-Feb-1928 DOA: 05/12/2018  PCP: [name], MD Patient coming from: Home  Chief Complaint: Altered mental status, generalized weakness, labored breathing  HPI: Hunter Choi is a 85 y.o. male with medical history significant of rheumatoid arthritis on methotrexate and prednisone, CVA, GERD, AAA, BPH, hypothyroidism, CAD, chronic systolic congestive heart failure with EF 30-35%, paroxysmal atrial fibrillation, status post TAVR presenting to the hospital for evaluation of altered mental status, generalized weakness, and labored breathing.  Patient is confused and mumbling words.  No history could be obtained from him.  When asked what his name was he replied " you are welcome" and " thank you."  No family available at this time.  Per ED provider conversation with the family: "Son brought patient to the emergency department today for 3 days of "labored breathing", generalized weakness, delirium.  He states that over the last 2 to 3 days, he has noticed some heavier breathing.  He states that patient has not been coughing.  He states that he has also noticed some generalized weakness and states that his diet is not as active as he is normally is.  Additionally, he reports that since yesterday, he has been confused.  He states that he will be having conversations with people who are not there.  Patiently, he will try to grab things that are not present.  Son states that at baseline, he is able to converse without any difficulty and knows where he is, who he is.  He states that he has no underlying history of dementia.  Son states that last time that this occurred, he had a UTI.  Son denies any alcohol use.  He states that patient has not had any falls.  Patient denies any pain.  None states the patient has not had any fevers, diarrhea, nausea/vomiting, abdominal pain, blood in stools."  Patient was recently admitted from 1/26 to 2/3 for  norovirus diarrhea.  Review of Systems: As per HPI otherwise 10 point review of systems negative.  Past Medical History:  Diagnosis Date  . AAA (abdominal aortic aneurysm) (HCC)    Dr. Ashley Royalty  . Aortic stenosis, severe    a. s/p TAVR  . Blind left eye   . BPH (benign prostatic hypertrophy)    Dr. Retta Diones  . CAD (coronary artery disease) of bypass graft    Chronic occlusion of saphenous vein graft to RCA  . Coronary artery disease    Left main disease and severe 3-vessel CAD  . Diastolic heart failure (HCC)   . Embolism involving retinal artery   . GERD (gastroesophageal reflux disease)   . Hard of hearing   . Hypertension   . Hypothyroidism   . Iliac artery aneurysm (HCC)    Dr. Edilia Bo  . PAF (paroxysmal atrial fibrillation) (HCC)   . 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 12/2012: mm Stephannie Peters XT transcatheter heart valve placed via transapical approach  . Stroke [facility])    mini stroke effective his eye left    Past Surgical History:  Procedure Laterality Date  . BIOPSY PROSTATE  2005  . BUNIONECTOMY WITH HAMMERTOE RECONSTRUCTION Bilateral   . CARDIAC CATHETERIZATION    . CARDIAC CATHETERIZATION N/A 05/07/2015   Procedure: Right Heart Cath and Coronary/Graft Angiography;  Surgeon: Lyn Records, MD;  Location: [facility] INVASIVE CV LAB;  Service: Cardiovascular;  Laterality: N/A;  . CAROTID ENDARTERECTOMY Right Jan. 2, 1997  .  CATARACT EXTRACTION W/ INTRAOCULAR LENS IMPLANT Right   . CHOLECYSTECTOMY N/A 01/28/2015   Procedure: LAPAROSCOPIC CHOLECYSTECTOMY WITH INTRAOPERATIVE CHOLANGIOGRAM;  Surgeon: Gaynelle Adu, MD;  Location: [facility] OR;  Service: General;  Laterality: N/A;  . COLONOSCOPY    . CORONARY ARTERY BYPASS GRAFT  01/1998   Dr. Sheliah Plane; LIMA to LAD, SVG to D1, SVG to LCx, SVG to RCA, open saphenous vein harvest via right lower extremity  . HARVEST BONE GRAFT  1982   FOR THE  ANKLE  . HEMIARTHROPLASTY HIP Left    AFTER HIP FX    . INTRAOPERATIVE TRANSESOPHAGEAL ECHOCARDIOGRAM N/A 01/15/2013   Procedure: INTRAOPERATIVE TRANSESOPHAGEAL ECHOCARDIOGRAM;  Surgeon: [name], MD;  Location: [facility] OR;  Service: Open Heart Surgery;  Laterality: N/A;  . JOINT REPLACEMENT    . LEFT AND RIGHT HEART CATHETERIZATION WITH CORONARY ANGIOGRAM N/A 12/20/2012   Procedure: LEFT AND RIGHT HEART CATHETERIZATION WITH CORONARY ANGIOGRAM;  Surgeon: Lesleigh Noe, MD;  Location: [facility] CATH LAB;  Service: Cardiovascular;  Laterality: N/A;  . LEFT HEART CATH AND CORS/GRAFTS ANGIOGRAPHY N/A 02/14/2017   Procedure: LEFT HEART CATH AND CORS/GRAFTS ANGIOGRAPHY;  Surgeon: Marykay Lex, MD;  Location: [facility] INVASIVE CV LAB;  Service: Cardiovascular;  Laterality: N/A;  . SKIN GRAFTS  1968  . TOTAL HIP ARTHROPLASTY Left 2007  . TOTAL KNEE ARTHROPLASTY  1992-2002   right-left  . TRANSCATHETER AORTIC VALVE REPLACEMENT, TRANSAPICAL N/A 01/15/2013   Procedure: TRANSCATHETER AORTIC VALVE REPLACEMENT, TRANSAPICAL;  Surgeon: [name], MD;  Location: [facility] OR;  Service: Open Heart Surgery;  Laterality: N/A;  . TRANSURETHRAL RESECTION OF PROSTATE  2012     reports that he quit smoking about 55 years ago. His smoking use included cigarettes. He has a 18.50 pack-year smoking history. He has quit using smokeless tobacco.  His smokeless tobacco use included chew. He reports that he does not drink alcohol or use drugs.  Allergies  Allergen Reactions  . Ciprofloxacin Other (See Comments)    REACTION: mild delirium    Family History  Problem Relation Age of Onset  . Heart attack Mother   . Heart disease Mother        Before age 52  . Hyperlipidemia Mother   . Hypertension Mother   . Hypertension Son   . Heart disease Father        AAA-   . Hyperlipidemia Father   . Heart attack Father   . Hypertension Father   . Heart disease Brother        Before age 57  . Hyperlipidemia Brother   . Heart attack Brother   . Hypertension Brother     Prior  to Admission medications   Medication Sig Start Date End Date Taking? Authorizing Provider  acetaminophen (TYLENOL) 500 MG tablet Take 1,000 mg by mouth 2 (two) times daily.    [provider]  Amino Acids-Protein Hydrolys (FEEDING SUPPLEMENT, PRO-STAT SUGAR FREE 64,) LIQD Take 30 mLs by mouth 2 (two) times daily. 03/27/18   Dhungel, Nishant, MD  amLODipine (NORVASC) 5 MG tablet Take 5 mg by mouth daily.    [provider]  aspirin EC 81 MG EC tablet Take 1 tablet (81 mg total) by mouth daily. 01/21/13   Ardelle Balls, PA-C  atorvastatin (LIPITOR) 20 MG tablet Take 20 mg by mouth daily.    [provider]  carvedilol (COREG) 6.25 MG tablet Take 1 tablet (6.25 mg total) by mouth 2 (two) times daily with a  meal. 10/21/15   Lyn Records, MD  clopidogrel (PLAVIX) 75 MG tablet TAKE 1 TABLET BY MOUTH EVERY DAY Patient taking differently: Take 75 mg by mouth daily.  04/16/18   Leone Brand, NP  feeding supplement, ENSURE ENLIVE, (ENSURE ENLIVE) LIQD Take 237 mLs by mouth 3 (three) times daily between meals. 03/27/18   Dhungel, Theda Belfast, MD  folic acid (FOLVITE) 1 MG tablet Take 3 mg by mouth daily.    [provider]  furosemide (LASIX) 20 MG tablet TAKE 2 TABLETS BY MOUTH ONE TIME A DAY Patient taking differently: Take 40 mg by mouth daily.  02/19/18   Lyn Records, MD  levothyroxine (SYNTHROID, LEVOTHROID) 88 MCG tablet Take 1 tablet (88 mcg total) by mouth daily. 03/27/18   Dhungel, Theda Belfast, MD  losartan (COZAAR) 25 MG tablet Take 25 mg by mouth daily. 03/19/18   [provider]  methotrexate 2.5 MG tablet Take 25 mg by mouth every Friday.     [provider]  Multiple Vitamin (MULTIVITAMIN WITH MINERALS) TABS tablet Take 1 tablet by mouth daily. 03/27/18   Dhungel, Nishant, MD  nitroGLYCERIN (NITROSTAT) 0.4 MG SL tablet PLACE 1 TABLET (0.4 MG TOTAL) UNDER THE TONGUE EVERY 5 MINUTES AS NEEDED FOR CHEST PAIN Patient taking differently:  Place 0.4 mg under the tongue every 5 (five) minutes as needed.  12/21/16   Lyn Records, MD  pantoprazole (PROTONIX) 40 MG tablet Take 1 tablet (40 mg total) by mouth daily. 02/18/17   Azalee Course, PA  predniSONE (DELTASONE) 5 MG tablet Take 5 mg by mouth daily. 04/07/15   [provider]  vitamin B-12 (CYANOCOBALAMIN) 1000 MCG tablet Take 1 tablet (1,000 mcg total) by mouth daily. 03/27/18   Eddie North, MD    Physical Exam: Vitals:   05/12/18 1801 05/12/18 1804 05/12/18 1945 05/12/18 2000  BP:   121/65 116/88  Pulse: 61  65 66  Resp: (!) 27  19 (!) 21  Temp:  98.1 F (36.7 C)    TempSrc:  Rectal    SpO2: 97%  98% 98%    Physical Exam  Constitutional: He appears well-developed and well-nourished. No distress.  HENT:  Head: Normocephalic.  Eyes:  Unable to assess pupillary reaction to light due to lack of patient cooperation.  Neck: Neck supple.  Cardiovascular: Normal rate, regular rhythm and intact distal pulses.  Pulmonary/Chest: Effort normal. He has no wheezes.  Equal air entry bilaterally upon auscultation of anterior lung fields  Abdominal: Soft. Bowel sounds are normal. He exhibits no distension. There is no abdominal tenderness. There is no rebound and no guarding.  Musculoskeletal:        General: No edema.     Comments: No erythema, edema, tenderness, or increased warmth of lower extremities.  Neurological:  Confused, eyes closed, mumbling words Not following commands. Unable to do a thorough neurologic exam at this time.  Skin: Skin is warm and dry. He is not diaphoretic.     Labs on Admission: I have personally reviewed following labs and imaging studies  CBC: Recent Labs  Lab 05/12/18 1603 05/12/18 1936  WBC 10.6*  --   HGB 9.2* 9.9*  HCT 31.2* 29.0*  MCV 92.9  --   PLT 303  --    Basic Metabolic Panel: Recent Labs  Lab 05/12/18 1603 05/12/18 1936  NA 135 136  K 4.4 4.4  CL 97*  --   CO2 26  --   GLUCOSE 132*  --  BUN 26*  --    CREATININE 1.50*  --   CALCIUM 8.5*  --    GFR: Estimated Creatinine Clearance: 29.6 mL/min (A) (by C-G formula based on SCr of 1.5 mg/dL (H)). Liver Function Tests: Recent Labs  Lab 05/12/18 1603  AST 24  ALT 13  ALKPHOS 62  BILITOT 0.7  PROT 6.8  ALBUMIN 3.4*   No results for input(s): LIPASE, AMYLASE in the last 168 hours. No results for input(s): AMMONIA in the last 168 hours. Coagulation Profile: No results for input(s): INR, PROTIME in the last 168 hours. Cardiac Enzymes: No results for input(s): CKTOTAL, CKMB, CKMBINDEX, TROPONINI in the last 168 hours. BNP (last 3 results) No results for input(s): PROBNP in the last 8760 hours. HbA1C: No results for input(s): HGBA1C in the last 72 hours. CBG: Recent Labs  Lab 05/12/18 1631  GLUCAP 125*   Lipid Profile: No results for input(s): CHOL, HDL, LDLCALC, TRIG, CHOLHDL, LDLDIRECT in the last 72 hours. Thyroid Function Tests: No results for input(s): TSH, T4TOTAL, FREET4, T3FREE, THYROIDAB in the last 72 hours. Anemia Panel: No results for input(s): VITAMINB12, FOLATE, FERRITIN, TIBC, IRON, RETICCTPCT in the last 72 hours. Urine analysis:    Component Value Date/Time   COLORURINE YELLOW 05/12/2018 1620   APPEARANCEUR CLEAR 05/12/2018 1620   LABSPEC 1.009 05/12/2018 1620   PHURINE 7.0 05/12/2018 1620   GLUCOSEU NEGATIVE 05/12/2018 1620   HGBUR NEGATIVE 05/12/2018 1620   BILIRUBINUR NEGATIVE 05/12/2018 1620   KETONESUR NEGATIVE 05/12/2018 1620   PROTEINUR NEGATIVE 05/12/2018 1620   UROBILINOGEN 4.0 (H) 11/14/2014 1849   NITRITE NEGATIVE 05/12/2018 1620   LEUKOCYTESUR NEGATIVE 05/12/2018 1620    Radiological Exams on Admission: Dg Chest 2 View  Result Date: 05/12/2018 CLINICAL DATA:  Shortness of breath, altered status. EXAM: CHEST - 2 VIEW COMPARISON:  Chest x-rays dated 04/27/2018 and 12/09/2017. FINDINGS: Stable cardiomegaly. Median sternotomy wires appear intact and stable in alignment. Valvular hardware  appears stable in position. Chronic opacities at the bilateral lung bases, presumably chronic atelectasis or scarring/fibrosis. Mild central pulmonary vascular congestion. Probable small bilateral pleural effusions. No pneumothorax seen. No acute or suspicious osseous finding. IMPRESSION: 1. Stable cardiomegaly. Mild central pulmonary vascular congestion, similar to previous studies, suspected chronic mild CHF. 2. Probable small bilateral pleural effusions. 3. Chronic opacities at the bilateral lung bases, suspected chronic atelectasis or scarring/fibrosis. Electronically Signed   By: Bary RichardStan  Maynard M.D.   On: 05/12/2018 19:01   Ct Head Wo Contrast  Result Date: 05/12/2018 CLINICAL DATA:  Altered mental status.  Weakness.  Slurred speech. EXAM: CT HEAD WITHOUT CONTRAST TECHNIQUE: Contiguous axial images were obtained from the base of the skull through the vertex without intravenous contrast. COMPARISON:  Aug 08, 2017 FINDINGS: Brain: No subdural, epidural, or subarachnoid hemorrhage. Lacunar infarcts in the cerebellum are stable. Basal cisterns and brainstem are normal. Ventricles and sulci are unchanged. Severe white matter changes are stable. No acute cortical ischemia or infarct. No mass effect or midline shift identified. Vascular: No hyperdense vessel or unexpected calcification. Skull: Normal. Negative for fracture or focal lesion. Sinuses/Orbits: Opacification of left greater than right mastoid air cells is stable. Middle ears are well aerated. Paranasal sinuses are well aerated. Other: None. IMPRESSION: 1. Chronic white matter changes with no acute intracranial abnormalities. 2. No other acute abnormalities. Electronically Signed   By: Gerome Samavid  Williams III M.D   On: 05/12/2018 17:58    EKG: Independently reviewed.  Sinus rhythm, PR prolongation.  ST changes similar to  prior tracing.  Assessment/Plan Principal Problem:   Acute exacerbation of CHF (congestive heart failure) (HCC) Active Problems:    Rheumatoid arthritis (HCC)   Thyroid disease   Coronary artery disease involving coronary bypass graft of native heart with angina pectoris (HCC)   Acute metabolic encephalopathy   Acute exacerbation of chronic systolic congestive heart failure -Family reported 3-day history of labored breathing.  Patient is not hypoxic and satting well on room air.  No increased work of breathing. VBG showing pH 7.44. -BNP greater than 4500, above patient's baseline. Chest x-ray showing stable cardiomegaly, mild central pulmonary vascular congestion, and small bilateral pleural effusions. -Echo done May 2019 showing EF 30 to 35%. -PE less likely as not hypoxic or tachycardic.  No clinical signs of DVT on exam. -Received IV Lasix 40 mg in the ED.  Continue IV Lasix 40 mg twice daily starting tomorrow morning. -Monitor intake and output -Daily weights -Echocardiogram -Currently not wheezing on exam.  Per ED provider, noted to have mild wheezing on exam.  No documented history of COPD or asthma.  Continue DuoNebs every 6 hours.  Acute metabolic encephalopathy -Presenting with a 1 day history of confusion and hallucinations. -CT head negative for acute finding. -Blood ethanol level negative.   -Although white count borderline elevated at 10.6, infectious etiology less likely to explain patient's acute encephalopathy as he is afebrile. UA not suggestive of infection.  Chest x-ray without evidence of pneumonia. -No sedating medications listed on home medication list. -MRI brain -UDS -Check TSH, B12, ammonia levels -Frequent neurochecks -Blood culture x2 pending -Influenza panel pending -Continue to monitor white blood cell count  Addendum: Ammonia level normal.  TSH markedly elevated at 15.1 which could be contributing to his encephalopathy.  Per chart review, TSH was elevated at 18.6 during his previous hospitalization in December and dose of Synthroid was increased to 88 mcg daily at that time.  -Dose of  Synthroid has been increased to 100 mcg daily.  Check free T4 level.  Patient will need repeat TSH in 4 to 6 weeks.  Chronic anemia -Stable.  Hemoglobin 9.2, at baseline.  CKD 3 -BUN 26.  Creatinine 1.5, 1.2-1.4 during recent hospitalization. -Continue to monitor renal function with IV diuresis -Avoid nephrotoxic agents/contrast -Monitor urine output  Rheumatoid arthritis -Continue home methotrexate and prednisone  History of CVA -Continue aspirin  Hyperlipidemia -Continue Lipitor  Paroxysmal atrial fibrillation -Not on anticoagulation.  Continue Coreg.  CAD status post CABG -EKG without acute changes.  Continue Coreg, aspirin, statin.  Hypothyroidism -Continue Synthroid.  Check TSH level.  DVT prophylaxis: Lovenox (renally dosed) Code Status: Full code.  No family available at this time to discuss CODE STATUS.  Please discuss with family in the morning. Family Communication: No family available at this time. Disposition Plan: Anticipate discharge after clinical improvement. Consults called: None Admission status: It is my clinical opinion that admission to INPATIENT is reasonable and necessary in this 85 y.o. male . presenting with symptoms of labored breathing, confusion, concerning for acute exacerbation of chronic systolic congestive heart failure and acute encephalopathy . with pertinent positives on physical exam including: Confusion, disorientation . and pertinent positives on radiographic and laboratory data including: Chest x-ray with evidence of volume overload. . Workup and treatment include IV diuresis and work-up of acute encephalopathy.  Given the aforementioned, the predictability of an adverse outcome is felt to be significant. I expect that the patient will require at least 2 midnights in the hospital to treat this condition.  John GiovanniVasundhra Mosi Hannold MD [facility] Pager [phone]- [phone]  If 7PM-7AM, please contact  night-coverage [email or web address] Password [facility] [email or web address]  05/12/2018, 10:12 PM

## 2018-05-12 NOTE — ED Provider Notes (Signed)
[facility] Provider Note   CSN: [id number] Arrival date & time: 05/12/18  1530     History   Chief Complaint Chief Complaint  Patient presents with  . Altered Mental Status  . Weakness    HPI Hunter Choi is a 85 y.o. male with PMH/o AAA, CAD, CHF, HTN, who presents with son for evaluation of AMS and generalized weakness.  Son brought patient to the emergency department today for 3 days of "labored breathing", generalized weakness, delirium.  He states that over the last 2 to 3 days, he has noticed some heavier breathing.  He states that patient has not been coughing.  He states that he has also noticed some generalized weakness and states that his diet is not as active as he is normally is.  Additionally, he reports that since yesterday, he has been confused.  He states that he will be having conversations with people who are not there.  Patiently, he will try to grab things that are not present.  Son states that at baseline, he is able to converse without any difficulty and knows where he is, who he is.  He states that he has no underlying history of dementia.  Son states that last time that this occurred, he had a UTI.  Son denies any alcohol use.  He states that patient has not had any falls.  Patient denies any pain.  None states the patient has not had any fevers, diarrhea, nausea/vomiting, abdominal pain, blood in stools.  He was recently in the hospital approximately 2 weeks ago for norovirus.  EM LEVEL 5 CAVEAT DUE TO AMS  The history is provided by a relative.    Past Medical History:  Diagnosis Date  . AAA (abdominal aortic aneurysm) (HCC)    Dr. Ashley Royalty. Dickson  . Aortic stenosis, severe    a. s/p TAVR  . Blind left eye   . BPH (benign prostatic hypertrophy)    Dr. Retta Dionesahlstedt  . CAD (coronary artery disease) of bypass graft    Chronic occlusion of saphenous vein graft to RCA  . Coronary artery disease    Left main disease and severe  3-vessel CAD  . Diastolic heart failure (HCC)   . Embolism involving retinal artery   . GERD (gastroesophageal reflux disease)   . Hard of hearing   . Hypertension   . Hypothyroidism   . Iliac artery aneurysm (HCC)    Dr. Edilia Boickson  . PAF (paroxysmal atrial fibrillation) (HCC)   . 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 12/2012: mm Stephannie PetersEdwards Sapien XT transcatheter heart valve placed via transapical approach  . Stroke [facility](HCC)    mini stroke effective his eye left    Patient Active Problem List   Diagnosis Date Noted  . Nausea vomiting and diarrhea 04/22/2018  . B12 deficiency 03/27/2018  . Hypothyroidism 03/27/2018  . Chronic combined systolic and diastolic CHF (congestive heart failure) (HCC) 03/27/2018  . Protein-calorie malnutrition, severe 03/24/2018  . Generalized weakness   . Acute metabolic encephalopathy 03/22/2018  . CKD (chronic kidney disease) stage 3, GFR 30-59 ml/min (HCC) 12/09/2017  . SIRS (systemic inflammatory response syndrome) (HCC) 08/03/2017  . Essential hypertension 08/03/2017  . Elevated troponin   . Sepsis due to urinary tract infection (HCC) 10/02/2015  . Hard of hearing   . Pressure ulcer 01/28/2015  . Acute on chronic combined systolic and diastolic congestive heart failure, NYHA class 4 (HCC) 01/27/2015  . Hyponatremia 11/21/2014  .  Elevated LFTs 11/14/2014  . Anemia 05/23/2014  . Thrombocytopenia (HCC) 02/21/2014  . Malnutrition of moderate degree (HCC) 02/21/2014  . Occlusion and stenosis of carotid artery without mention of cerebral infarction 09/04/2013  . Aftercare following surgery of the circulatory system, NEC 09/04/2013  . AAA (abdominal aortic aneurysm) without rupture (HCC) 03/06/2013  . 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 Coronary artery disease involving coronary bypass graft of native heart with angina pectoris (HCC)   . GERD (gastroesophageal reflux disease)   . Thyroid disease   .  Embolism involving retinal artery   . Iliac artery aneurysm (HCC)   . Rheumatoid arthritis (HCC) 04/22/2010    Past Surgical History:  Procedure Laterality Date  . BIOPSY PROSTATE  2005  . BUNIONECTOMY WITH HAMMERTOE RECONSTRUCTION Bilateral   . CARDIAC CATHETERIZATION    . CARDIAC CATHETERIZATION N/A 05/07/2015   Procedure: Right Heart Cath and Coronary/Graft Angiography;  Surgeon: Lyn Records, MD;  Location: [facility] INVASIVE CV LAB;  Service: Cardiovascular;  Laterality: N/A;  . CAROTID ENDARTERECTOMY Right Jan. 2, 1997  . CATARACT EXTRACTION W/ INTRAOCULAR LENS IMPLANT Right   . CHOLECYSTECTOMY N/A 01/28/2015   Procedure: LAPAROSCOPIC CHOLECYSTECTOMY WITH INTRAOPERATIVE CHOLANGIOGRAM;  Surgeon: Gaynelle Adu, MD;  Location: [facility] OR;  Service: General;  Laterality: N/A;  . COLONOSCOPY    . CORONARY ARTERY BYPASS GRAFT  01/1998   Dr. Sheliah Plane; LIMA to LAD, SVG to D1, SVG to LCx, SVG to RCA, open saphenous vein harvest via right lower extremity  . HARVEST BONE GRAFT  1982   FOR THE  ANKLE  . HEMIARTHROPLASTY HIP Left    AFTER HIP FX  . INTRAOPERATIVE TRANSESOPHAGEAL ECHOCARDIOGRAM N/A 01/15/2013   Procedure: INTRAOPERATIVE TRANSESOPHAGEAL ECHOCARDIOGRAM;  Surgeon: [name], MD;  Location: [facility] OR;  Service: Open Heart Surgery;  Laterality: N/A;  . JOINT REPLACEMENT    . LEFT AND RIGHT HEART CATHETERIZATION WITH CORONARY ANGIOGRAM N/A 12/20/2012   Procedure: LEFT AND RIGHT HEART CATHETERIZATION WITH CORONARY ANGIOGRAM;  Surgeon: Lesleigh Noe, MD;  Location: [facility] CATH LAB;  Service: Cardiovascular;  Laterality: N/A;  . LEFT HEART CATH AND CORS/GRAFTS ANGIOGRAPHY N/A 02/14/2017   Procedure: LEFT HEART CATH AND CORS/GRAFTS ANGIOGRAPHY;  Surgeon: Marykay Lex, MD;  Location: [facility] INVASIVE CV LAB;  Service: Cardiovascular;  Laterality: N/A;  . SKIN GRAFTS  1968  . TOTAL HIP ARTHROPLASTY Left 2007  . TOTAL KNEE ARTHROPLASTY  1992-2002   right-left  . TRANSCATHETER AORTIC VALVE  REPLACEMENT, TRANSAPICAL N/A 01/15/2013   Procedure: TRANSCATHETER AORTIC VALVE REPLACEMENT, TRANSAPICAL;  Surgeon: [name], MD;  Location: [facility] OR;  Service: Open Heart Surgery;  Laterality: N/A;  . TRANSURETHRAL RESECTION OF PROSTATE  2012        Home Medications    Prior to Admission medications   Medication Sig Start Date End Date Taking? Authorizing Provider  atorvastatin (LIPITOR) 20 MG tablet Take 20 mg by mouth daily.   Yes [provider]  fluticasone (FLONASE) 50 MCG/ACT nasal spray Place 1 spray into both nostrils at bedtime as needed for allergies or rhinitis.   Yes [provider]  acetaminophen (TYLENOL) 500 MG tablet Take 1,000 mg by mouth 2 (two) times daily.    [provider]  Amino Acids-Protein Hydrolys (FEEDING SUPPLEMENT, PRO-STAT SUGAR FREE 64,) LIQD Take 30 mLs by mouth 2 (two) times daily. 03/27/18   Dhungel, Nishant, MD  amLODipine (NORVASC) 5 MG tablet Take 5 mg by mouth daily.  [provider]  aspirin EC 81 MG EC tablet Take 1 tablet (81 mg total) by mouth daily. 01/21/13   Ardelle BallsZimmerman, Donielle M, PA-C  carvedilol (COREG) 6.25 MG tablet Take 1 tablet (6.25 mg total) by mouth 2 (two) times daily with a meal. 10/21/15   Lyn RecordsSmith, Henry W, MD  clopidogrel (PLAVIX) 75 MG tablet TAKE 1 TABLET BY MOUTH EVERY DAY Patient taking differently: Take 75 mg by mouth daily.  04/16/18   Leone BrandIngold, Laura R, NP  feeding supplement, ENSURE ENLIVE, (ENSURE ENLIVE) LIQD Take 237 mLs by mouth 3 (three) times daily between meals. 03/27/18   Dhungel, Theda BelfastNishant, MD  folic acid (FOLVITE) 1 MG tablet Take 3 mg by mouth daily.    [provider]  furosemide (LASIX) 20 MG tablet TAKE 2 TABLETS BY MOUTH ONE TIME A DAY Patient taking differently: Take 40 mg by mouth daily.  02/19/18   Lyn RecordsSmith, Henry W, MD  levothyroxine (SYNTHROID, LEVOTHROID) 88 MCG tablet Take 1 tablet (88 mcg total) by mouth daily. 03/27/18   Dhungel, Theda BelfastNishant, MD  losartan (COZAAR)  25 MG tablet Take 25 mg by mouth daily. 03/19/18   [provider]  methotrexate 2.5 MG tablet Take 25 mg by mouth every Friday.     [provider]  Multiple Vitamin (MULTIVITAMIN WITH MINERALS) TABS tablet Take 1 tablet by mouth daily. 03/27/18   Dhungel, Nishant, MD  nitroGLYCERIN (NITROSTAT) 0.4 MG SL tablet PLACE 1 TABLET (0.4 MG TOTAL) UNDER THE TONGUE EVERY 5 MINUTES AS NEEDED FOR CHEST PAIN Patient taking differently: Place 0.4 mg under the tongue every 5 (five) minutes as needed.  12/21/16   Lyn RecordsSmith, Henry W, MD  pantoprazole (PROTONIX) 40 MG tablet Take 1 tablet (40 mg total) by mouth daily. 02/18/17   Azalee CourseMeng, Hao, PA  predniSONE (DELTASONE) 5 MG tablet Take 5 mg by mouth daily. 04/07/15   [provider]  vitamin B-12 (CYANOCOBALAMIN) 1000 MCG tablet Take 1 tablet (1,000 mcg total) by mouth daily. 03/27/18   Eddie Northhungel, Nishant, MD    Family History Family History  Problem Relation Age of Onset  . Heart attack Mother   . Heart disease Mother        Before age 83  . Hyperlipidemia Mother   . Hypertension Mother   . Hypertension Son   . Heart disease Father        AAA-   . Hyperlipidemia Father   . Heart attack Father   . Hypertension Father   . Heart disease Brother        Before age 83  . Hyperlipidemia Brother   . Heart attack Brother   . Hypertension Brother     Social History Social History   Tobacco Use  . Smoking status: Former Smoker    Packs/day: 0.50    Years: 37.00    Pack years: 18.50    Types: Cigarettes    Last attempt to quit: 03/29/1963    Years since quitting: 55.1  . Smokeless tobacco: Former NeurosurgeonUser    Types: Chew  Substance Use Topics  . Alcohol use: No  . Drug use: No     Allergies   Ciprofloxacin   Review of Systems Review of Systems  Unable to perform ROS: Mental status change        Physical Exam Updated Vital Signs BP 116/88   Pulse 66   Temp 98.1 F (36.7 C) (Rectal)   Resp (!) 21   SpO2 98%    Physical Exam  Vitals signs and nursing note reviewed.  Constitutional:      Appearance: Normal appearance. He is well-developed.  HENT:     Head: Normocephalic and atraumatic.  Eyes:     General: Lids are normal.     Conjunctiva/sclera: Conjunctivae normal.     Pupils: Pupils are equal, round, and reactive to light.  Neck:     Musculoskeletal: Full passive range of motion without pain.  Cardiovascular:     Rate and Rhythm: Normal rate and regular rhythm.     Pulses:          Radial pulses are 2+ on the right side and 2+ on the left side.       Dorsalis pedis pulses are 2+ on the right side and 2+ on the left side.     Heart sounds: Normal heart sounds. No murmur. No friction rub. No gallop.   Pulmonary:     Effort: Pulmonary effort is normal.     Breath sounds: Wheezing present.     Comments: Diffuse wheezing noted.  No evidence of respiratory distress.  Able speak in full sentences without any difficulty. Abdominal:     Palpations: Abdomen is soft. Abdomen is not rigid.     Tenderness: There is no abdominal tenderness. There is no guarding.     Comments: Abdomen is soft, non-distended, non-tender. No rigidity, No guarding. No peritoneal signs.  Musculoskeletal: Normal range of motion.     Comments: Non-pitting edema noted to BLE. No overlying warmth or erythema.   Skin:    General: Skin is warm and dry.     Capillary Refill: Capillary refill takes less than 2 seconds.  Neurological:     Mental Status: He is alert.     Comments: Cranial nerves III-XII intact Follows commands, Moves all extremities  5/5 strength to BUE and BLE  Sensation intact throughout all major nerve distributions Normal coordination No facial droop.  Slight slurred speech.  Alert and oriented x 1. When I asked patient his name, he says "[birth year]."  When asked what year it is he says "1000."  Does respond to his in hospital.  Psychiatric:        Speech: Speech normal.      ED Treatments / Results   Labs (all labs ordered are listed, but only abnormal results are displayed) Labs Reviewed  COMPREHENSIVE METABOLIC PANEL - Abnormal; Notable for the following components:      Result Value   Chloride 97 (*)    Glucose, Bld 132 (*)    BUN 26 (*)    Creatinine, Ser 1.50 (*)    Calcium 8.5 (*)    Albumin 3.4 (*)    GFR calc non Af Amer 41 (*)    GFR calc Af Amer 47 (*)    All other components within normal limits  CBC - Abnormal; Notable for the following components:   WBC 10.6 (*)    RBC 3.36 (*)    Hemoglobin 9.2 (*)    HCT 31.2 (*)    MCHC 29.5 (*)    RDW 18.6 (*)    All other components within normal limits  BRAIN NATRIURETIC PEPTIDE - Abnormal; Notable for the following components:   B Natriuretic Peptide >4,500.0 (*)    All other components within normal limits  CBG MONITORING, ED - Abnormal; Notable for the following components:   Glucose-Capillary 125 (*)    All other components within normal limits  POCT I-STAT EG7 - Abnormal; Notable for the  following components:   pH, Ven 7.440 (*)    pCO2, Ven 43.8 (*)    pO2, Ven 31.0 (*)    Bicarbonate 29.7 (*)    Acid-Base Excess 5.0 (*)    Calcium, Ion 1.06 (*)    HCT 29.0 (*)    Hemoglobin 9.9 (*)    All other components within normal limits  CULTURE, BLOOD (ROUTINE X 2)  CULTURE, BLOOD (ROUTINE X 2)  URINALYSIS, ROUTINE W REFLEX MICROSCOPIC  ETHANOL  INFLUENZA PANEL BY PCR (TYPE A & B)    EKG EKG Interpretation  Date/Time:  Saturday May 12 2018 18:00:46 EST Ventricular Rate:  65 PR Interval:    QRS Duration: 171 QT Interval:  464 QTC Calculation: 483 R Axis:   -36 Text Interpretation:  Sinus rhythm Ventricular premature complex Sinus pause Prolonged PR interval Left bundle branch block Confirmed by Margarita Grizzle [phone]) on 05/12/2018 6:08:03 PM   Radiology Dg Chest 2 View  Result Date: 05/12/2018 CLINICAL DATA:  Shortness of breath, altered status. EXAM: CHEST - 2 VIEW COMPARISON:  Chest x-rays dated  04/27/2018 and 12/09/2017. FINDINGS: Stable cardiomegaly. Median sternotomy wires appear intact and stable in alignment. Valvular hardware appears stable in position. Chronic opacities at the bilateral lung bases, presumably chronic atelectasis or scarring/fibrosis. Mild central pulmonary vascular congestion. Probable small bilateral pleural effusions. No pneumothorax seen. No acute or suspicious osseous finding. IMPRESSION: 1. Stable cardiomegaly. Mild central pulmonary vascular congestion, similar to previous studies, suspected chronic mild CHF. 2. Probable small bilateral pleural effusions. 3. Chronic opacities at the bilateral lung bases, suspected chronic atelectasis or scarring/fibrosis. Electronically Signed   By: Bary Richard M.D.   On: 05/12/2018 19:01   Ct Head Wo Contrast  Result Date: 05/12/2018 CLINICAL DATA:  Altered mental status.  Weakness.  Slurred speech. EXAM: CT HEAD WITHOUT CONTRAST TECHNIQUE: Contiguous axial images were obtained from the base of the skull through the vertex without intravenous contrast. COMPARISON:  Aug 08, 2017 FINDINGS: Brain: No subdural, epidural, or subarachnoid hemorrhage. Lacunar infarcts in the cerebellum are stable. Basal cisterns and brainstem are normal. Ventricles and sulci are unchanged. Severe white matter changes are stable. No acute cortical ischemia or infarct. No mass effect or midline shift identified. Vascular: No hyperdense vessel or unexpected calcification. Skull: Normal. Negative for fracture or focal lesion. Sinuses/Orbits: Opacification of left greater than right mastoid air cells is stable. Middle ears are well aerated. Paranasal sinuses are well aerated. Other: None. IMPRESSION: 1. Chronic white matter changes with no acute intracranial abnormalities. 2. No other acute abnormalities. Electronically Signed   By: Gerome Sam III M.D   On: 05/12/2018 17:58    Procedures Procedures (including critical care time)  Medications Ordered in  ED Medications  sodium chloride flush (NS) 0.9 % injection 3 mL (has no administration in time range)  ipratropium-albuterol (DUONEB) 0.5-2.5 (3) MG/3ML nebulizer solution 3 mL (3 mLs Nebulization Given 05/12/18 1646)  furosemide (LASIX) injection 40 mg (40 mg Intravenous Given 05/12/18 2014)     Initial Impression / Assessment and Plan / ED Course  I have reviewed the triage vital signs and the nursing notes.  Pertinent labs & imaging results that were available during my care of the patient were reviewed by me and considered in my medical decision making (see chart for details).     85 year old male with past medical history of CAD, CHF, TAVR is for evaluation of altered mental status.  Son also brought in for 2 to 3 days of  increased work of breathing, generalized weakness.  Son also reports that for the last 24 hours, patient has been altered.  He states that patient is talking to things that are not there and trying to grab things.  He additionally reports that there is been some slurred speech.  He denies any falls.  He states that his dad does have a alcohol use.  Patient does have history of CHF and recently had his Lasix increased.  Additionally, he was recently admitted to the hospital nausea/vomiting and was discharged on 04/30/18. Patient is afebrile, non-toxic appearing, sitting comfortably on examination table. Vital signs reviewed and stable.  On exam, he has some mild wheezing noted throughout all lung fields.  No evidence of respiratory distress.  Is able speak in full sentences.  He is alert and oriented x1 and can only tell me he is in the hospital.  When asked what his name is, he says 49 and when I asked him what year it is, he says 1000.  Reports that this is abnormal for him as usually he is able to carry on conversation and is alert and oriented.  Labs, CT head.  CBG shows 125. UA negative for any acute infectious etiology.  CMP shows BUN at 26 and Cr at 1.50. CBC shows slight  leukocytosis of 10.6. Hgb/Hct is 9.2 and 31.2. Review of records show this is baseline.  BNP is elevated at greater than 4500.  CT head shows no evidence of acute intracranial abnormality.  VBG shows pH of 7.4, PCO2 43.8, PO2 of 31.   He does not have patient on results.  Per son, patient is still altered and is not acting appropriately.  Given continued altered mental status as well as likely acute on chronic CHF exacerbation, will plan for admission.  Discussed patient with Dr. Loney Loh (hospitalist). Will admit.   Portions of this note were generated with Scientist, clinical (histocompatibility and immunogenetics). Dictation errors may occur despite best attempts at proofreading.   Final Clinical Impressions(s) / ED Diagnoses   Final diagnoses:  Altered mental status, unspecified altered mental status type  Acute on chronic congestive heart failure, unspecified heart failure type [facility])    ED Discharge Orders    None       Rosana Hoes 05/12/18 2103    Margarita Grizzle, MD 05/13/18 1504

## 2018-05-13 ENCOUNTER — Other Ambulatory Visit: Payer: Self-pay | Admitting: Cardiology

## 2018-05-13 ENCOUNTER — Inpatient Hospital Stay (HOSPITAL_COMMUNITY): Payer: Medicare Other

## 2018-05-13 ENCOUNTER — Other Ambulatory Visit: Payer: Self-pay

## 2018-05-13 DIAGNOSIS — I361 Nonrheumatic tricuspid (valve) insufficiency: Secondary | ICD-10-CM

## 2018-05-13 DIAGNOSIS — I340 Nonrheumatic mitral (valve) insufficiency: Secondary | ICD-10-CM

## 2018-05-13 LAB — ECHOCARDIOGRAM COMPLETE
Height: 65 in
Weight: 2380.97 oz

## 2018-05-13 LAB — BASIC METABOLIC PANEL
Anion gap: 11 (ref 5–15)
BUN: 26 mg/dL — ABNORMAL HIGH (ref 8–23)
CALCIUM: 8.4 mg/dL — AB (ref 8.9–10.3)
CO2: 28 mmol/L (ref 22–32)
Chloride: 99 mmol/L (ref 98–111)
Creatinine, Ser: 1.46 mg/dL — ABNORMAL HIGH (ref 0.61–1.24)
GFR calc Af Amer: 49 mL/min — ABNORMAL LOW (ref >60)
GFR calc non Af Amer: 42 mL/min — ABNORMAL LOW (ref >60)
Glucose, Bld: 88 mg/dL (ref 70–99)
Potassium: 3.9 mmol/L (ref 3.5–5.1)
Sodium: 138 mmol/L (ref 135–145)

## 2018-05-13 LAB — CBC
HCT: 28.6 % — ABNORMAL LOW (ref 39.0–52.0)
Hemoglobin: 8.3 g/dL — ABNORMAL LOW (ref 13.0–17.0)
MCH: 26.4 pg (ref 26.0–34.0)
MCHC: 29 g/dL — ABNORMAL LOW (ref 30.0–36.0)
MCV: 91.1 fL (ref 80.0–100.0)
PLATELETS: 260 10*3/uL (ref 150–400)
RBC: 3.14 MIL/uL — ABNORMAL LOW (ref 4.22–5.81)
RDW: 18.9 % — ABNORMAL HIGH (ref 11.5–15.5)
WBC: 8 10*3/uL (ref 4.0–10.5)
nRBC: 0.3 % — ABNORMAL HIGH (ref 0.0–0.2)

## 2018-05-13 LAB — T4, FREE: Free T4: 1.24 ng/dL (ref 0.82–1.77)

## 2018-05-13 LAB — BLOOD GAS, ARTERIAL
Acid-Base Excess: 6.5 mmol/L — ABNORMAL HIGH (ref 0.0–2.0)
Bicarbonate: 30 mmol/L — ABNORMAL HIGH (ref 20.0–28.0)
Drawn by: 406621
FIO2: 21
O2 Saturation: 92.5 %
PCO2 ART: 39.7 mmHg (ref 32.0–48.0)
Patient temperature: 98.6
pH, Arterial: 7.491 — ABNORMAL HIGH (ref 7.350–7.450)
pO2, Arterial: 64 mmHg — ABNORMAL LOW (ref 83.0–108.0)

## 2018-05-13 LAB — VITAMIN B12: Vitamin B-12: 707 pg/mL (ref 180–914)

## 2018-05-13 MED ORDER — LEVOTHYROXINE SODIUM 100 MCG/5ML IV SOLN
50.0000 ug | Freq: Every day | INTRAVENOUS | Status: DC
  Administered 2018-05-13 – 2018-05-14 (×2): 50 ug via INTRAVENOUS
  Filled 2018-05-13 (×2): qty 5

## 2018-05-13 MED ORDER — IPRATROPIUM-ALBUTEROL 0.5-2.5 (3) MG/3ML IN SOLN: 3.0000 mL | RESPIRATORY_TRACT | Status: DC | PRN

## 2018-05-13 MED ORDER — ORAL CARE MOUTH RINSE
15.0000 mL | Freq: Two times a day (BID) | OROMUCOSAL | Status: DC
  Administered 2018-05-13 – 2018-05-14 (×2): 15 mL via OROMUCOSAL

## 2018-05-13 MED ORDER — FUROSEMIDE 10 MG/ML IJ SOLN
40.0000 mg | Freq: Two times a day (BID) | INTRAMUSCULAR | Status: DC
  Administered 2018-05-13 – 2018-05-14 (×3): 40 mg via INTRAVENOUS
  Filled 2018-05-13 (×3): qty 4

## 2018-05-13 MED ORDER — ACETAMINOPHEN 650 MG RE SUPP: 650.0000 mg | RECTAL | Status: DC | PRN

## 2018-05-13 MED ORDER — ACETAMINOPHEN 325 MG PO TABS: 650.0000 mg | ORAL_TABLET | Freq: Four times a day (QID) | ORAL | Status: DC | PRN

## 2018-05-13 NOTE — Progress Notes (Signed)
CCMD called to report wenkebach and hr down to 30's, pt eyes closed and easily awakens, speech is gargbled and difficult to understand but will nod and answer, vss, Dr Allena Katz on unit and advised of periods of wenkebach with 1st degree avb. He will see pt

## 2018-05-13 NOTE — Evaluation (Signed)
Clinical/Bedside Swallow Evaluation Patient Details  Name: Hunter Choi MRN: [id number] Date of Birth: Jan 03, 1928  Today's Date: 05/13/2018 Time: SLP Start Time (ACUTE ONLY): 1020 SLP Stop Time (ACUTE ONLY): 1046 SLP Time Calculation (min) (ACUTE ONLY): 26 min  Past Medical History:  Past Medical History:  Diagnosis Date  . AAA (abdominal aortic aneurysm) (HCC)    Dr. Ashley Royalty  . Aortic stenosis, severe    a. s/p TAVR  . Blind left eye   . BPH (benign prostatic hypertrophy)    Dr. Retta Diones  . CAD (coronary artery disease) of bypass graft    Chronic occlusion of saphenous vein graft to RCA  . Coronary artery disease    Left main disease and severe 3-vessel CAD  . Diastolic heart failure (HCC)   . Embolism involving retinal artery   . GERD (gastroesophageal reflux disease)   . Hard of hearing   . Hypertension   . Hypothyroidism   . Iliac artery aneurysm (HCC)    Dr. Edilia Bo  . PAF (paroxysmal atrial fibrillation) (HCC)   . 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 12/2012: mm Stephannie Peters XT transcatheter heart valve placed via transapical approach  . Stroke [facility])    mini stroke effective his eye left   Past Surgical History:  Past Surgical History:  Procedure Laterality Date  . BIOPSY PROSTATE  2005  . BUNIONECTOMY WITH HAMMERTOE RECONSTRUCTION Bilateral   . CARDIAC CATHETERIZATION    . CARDIAC CATHETERIZATION N/A 05/07/2015   Procedure: Right Heart Cath and Coronary/Graft Angiography;  Surgeon: Lyn Records, MD;  Location: [facility] INVASIVE CV LAB;  Service: Cardiovascular;  Laterality: N/A;  . CAROTID ENDARTERECTOMY Right Jan. 2, 1997  . CATARACT EXTRACTION W/ INTRAOCULAR LENS IMPLANT Right   . CHOLECYSTECTOMY N/A 01/28/2015   Procedure: LAPAROSCOPIC CHOLECYSTECTOMY WITH INTRAOPERATIVE CHOLANGIOGRAM;  Surgeon: Gaynelle Adu, MD;  Location: [facility] OR;  Service: General;  Laterality: N/A;  . COLONOSCOPY    . CORONARY ARTERY BYPASS GRAFT  01/1998   Dr.  Sheliah Plane; LIMA to LAD, SVG to D1, SVG to LCx, SVG to RCA, open saphenous vein harvest via right lower extremity  . HARVEST BONE GRAFT  1982   FOR THE  ANKLE  . HEMIARTHROPLASTY HIP Left    AFTER HIP FX  . INTRAOPERATIVE TRANSESOPHAGEAL ECHOCARDIOGRAM N/A 01/15/2013   Procedure: INTRAOPERATIVE TRANSESOPHAGEAL ECHOCARDIOGRAM;  Surgeon: [name], MD;  Location: [facility] OR;  Service: Open Heart Surgery;  Laterality: N/A;  . JOINT REPLACEMENT    . LEFT AND RIGHT HEART CATHETERIZATION WITH CORONARY ANGIOGRAM N/A 12/20/2012   Procedure: LEFT AND RIGHT HEART CATHETERIZATION WITH CORONARY ANGIOGRAM;  Surgeon: Lesleigh Noe, MD;  Location: [facility] CATH LAB;  Service: Cardiovascular;  Laterality: N/A;  . LEFT HEART CATH AND CORS/GRAFTS ANGIOGRAPHY N/A 02/14/2017   Procedure: LEFT HEART CATH AND CORS/GRAFTS ANGIOGRAPHY;  Surgeon: Marykay Lex, MD;  Location: [facility] INVASIVE CV LAB;  Service: Cardiovascular;  Laterality: N/A;  . SKIN GRAFTS  1968  . TOTAL HIP ARTHROPLASTY Left 2007  . TOTAL KNEE ARTHROPLASTY  1992-2002   right-left  . TRANSCATHETER AORTIC VALVE REPLACEMENT, TRANSAPICAL N/A 01/15/2013   Procedure: TRANSCATHETER AORTIC VALVE REPLACEMENT, TRANSAPICAL;  Surgeon: [name], MD;  Location: [facility] OR;  Service: Open Heart Surgery;  Laterality: N/A;  . TRANSURETHRAL RESECTION OF PROSTATE  2012   HPI:  Hunter Choi is a 85 y.o. male with medical history significant of rheumatoid arthritis on methotrexate and prednisone, CVA, GERD,  AAA, BPH, hypothyroidism, CAD, chronic systolic congestive heart failure with EF 30-35%, paroxysmal atrial fibrillation, status post TAVR presenting to the hospital for evaluation of altered mental status, generalized weakness, and labored breathing.  Patient is confused and mumbling words.  No history could be obtained from him.  When asked what his name was he replied " you are welcome" and " thank you."  No family available at this time.  MRI of the head is  showing no acute abnomality, however, mutliple remote lacunar infarcts in the bilateral basal ganglia, thalami and pons as well as multiple chronic bilateral cerebellar infarcts.  Chest xray is showing mild central pulmonary vascular congestion, suspected chronic mild CHF, probable small bilateral pleural effusions and chronic opacities in the bilateral lung bases, suspected chronic atelectasis or scarring/fibrosis.     Assessment / Plan / Recommendation Clinical Impression  Clinical swalllowing evaluation was completed using thin liquids via spoon and pureed material.  Limited cranial nerve exam was completed as the patient was unable to consistently follow all directions.  Facial droop on the left was seen at rest and during movement.  Lingual range of motion was mildly decreased and patient was unable to perform lingual elevation.  Strength was unable to be assessed.  Volitional cough was weak.  The patient endorsed issues swallowing described as coughing with foods but suspect he is an unreliable historian given AMS on admission.  He presented with a possible oropharyngeal dysphagia.  Immediate strong cough response was seen x 2 given spoon sips of thin liquids.  He was also slow to move pureed material orally.  Recommend NPO pending results of MBS to determine current swallowing physiology and least restrictive diet.     SLP Visit Diagnosis: Dysphagia, unspecified (R13.10)    Aspiration Risk  Moderate aspiration risk    Diet Recommendation   NPO pending MBS  Medication Administration: Whole meds with puree    Other  Recommendations Oral Care Recommendations: Oral care QID   Follow up Recommendations Other (comment)(TBD)             Prognosis Prognosis for Safe Diet Advancement: Good Barriers to Reach Goals: Cognitive deficits      Swallow Study   General Date of Onset: 05/12/18 HPI: Valinda HoarClyde G Choi is a 85 y.o. male with medical history significant of rheumatoid arthritis on  methotrexate and prednisone, CVA, GERD, AAA, BPH, hypothyroidism, CAD, chronic systolic congestive heart failure with EF 30-35%, paroxysmal atrial fibrillation, status post TAVR presenting to the hospital for evaluation of altered mental status, generalized weakness, and labored breathing.  Patient is confused and mumbling words.  No history could be obtained from him.  When asked what his name was he replied " you are welcome" and " thank you."  No family available at this time.  MRI of the head is showing no acute abnomality, however, mutliple remote lacunar infarcts in the bilateral basal ganglia, thalami and pons as well as multiple chronic bilateral cerebellar infarcts.  Chest xray is showing mild central pulmonary vascular congestion, suspected chronic mild CHF, probable small bilateral pleural effusions and chronic opacities in the bilateral lung bases, suspected chronic atelectasis or scarring/fibrosis.   Type of Study: Bedside Swallow Evaluation [facility] Diet Prior to this Study: NPO Temperature Spikes Noted: No Respiratory Status: Room air(but nurse was getting ready to place a nasal cannula) History of Recent Intubation: No Behavior/Cognition: Alert;Cooperative;Confused;Requires cueing;Doesn't follow directions Oral Cavity Assessment: Dry Oral Care Completed by SLP: No  Oral Cavity - Dentition: Missing dentition Vision: Functional for self-feeding Self-Feeding Abilities: Total assist Patient Positioning: Upright in bed Baseline Vocal Quality: Normal Volitional Cough: Weak Volitional Swallow: Able to elicit    Oral/Motor/Sensory Function Overall Oral Motor/Sensory Function: Other (comment)(Limited assessment)   Ice Chips Ice chips: Not tested   Thin Liquid Thin Liquid: Impaired Presentation: Spoon Pharyngeal  Phase Impairments: Cough - Immediate    Nectar Thick Nectar Thick Liquid: Not tested   Honey Thick Honey Thick Liquid: Not tested    Puree Puree: Impaired Presentation: Spoon Oral Phase Impairments: Impaired mastication Oral Phase Functional Implications: Prolonged oral transit   Solid     Solid: Not tested     Dimas Aguas, MA, CCC-SLP Acute Rehab SLP [phone] [address] 05/13/2018,10:59 AM

## 2018-05-13 NOTE — Progress Notes (Signed)
  Echocardiogram 2D Echocardiogram has been performed.  Delcie Roch 05/13/2018, 10:21 AM

## 2018-05-13 NOTE — Progress Notes (Signed)
[facility] Progress Note  Patient: Hunter Choi XBM:[id number]   PCP: Lorenda IshiharaVaradarajan, [name], MD DOB: [birth year]   DOA: 05/12/2018   DOS: 05/13/2018   Date of Service: the patient was seen and examined on 05/13/2018  Brief hospital course: Pt. with PMH of rheumatoid arthritis on methotrexate and prednisone, CVA, GERD, AAA, BPH, hypothyroidism, CAD, chronic systolic congestive heart failure with EF 30-35%, paroxysmal atrial fibrillation, status post TAVR; admitted on 05/12/2018, presented with complaint of confusion and shortness of breath, was found to have acute CHF. Currently further plan is continue IV diuresis.  Subjective: Initially was sleepy and lethargic.  Later on improved.  No nausea no vomiting.  Oriented.  No chest pain abdominal pain.  Continues to have shortness of breath.  Also has cough.  Assessment and Plan: Acute exacerbation of chronic systolic congestive heart failure -Family reported 3-day history of labored breathing.  Patient is not hypoxic and satting well on room air.  No increased work of breathing. VBG showing pH 7.44. -BNP greater than 4500, above patient's baseline. Chest x-ray showing stable cardiomegaly, mild central pulmonary vascular congestion, and small bilateral pleural effusions. -Echo done May 2019 showing EF 30 to 35%. -PE less likely as not hypoxic or tachycardic.  No clinical signs of DVT on exam. -Received IV Lasix 40 mg in the ED.  Continue IV Lasix 40 mg twice daily -Monitor intake and output -Daily weights -Echocardiogram from prior no significant difference.  30 to 35% EF. -Currently not wheezing on exam.  Per ED provider, noted to have mild wheezing on exam.  No documented history of COPD or asthma.  Continue DuoNebs every 6 hours.  Acute metabolic encephalopathy -Presenting with a 1 day history of confusion and hallucinations. -CT head negative for acute finding. -Blood ethanol level negative.   -Although white count borderline elevated  at 10.6, infectious etiology less likely to explain patient's acute encephalopathy as he is afebrile. UA not suggestive of infection.  Chest x-ray without evidence of pneumonia. -No sedating medications listed on home medication list. -MRI brain negative -UDS negative -normal TSH, B12, ammonia levels -Frequent neurochecks -Blood culture x2 pendin -Influenza panel negative -Continue to monitor white blood cell count -Modified barium swallow ordered.  Will monitor.  Currently transitioning Synthroid to IV.  ABG shows no evidence of hypercarbia.  Chronic anemia -Stable.  Hemoglobin 9.2, at baseline.  CKD 3 -BUN 26.  Creatinine 1.5, 1.2-1.4 during recent hospitalization. -Continue to monitor renal function with IV diuresis -Avoid nephrotoxic agents/contrast -Monitor urine output  Rheumatoid arthritis -Hold home methotrexate and continue prednisone  History of CVA -Continue aspirin  Hyperlipidemia -Continue Lipitor  Paroxysmal atrial fibrillation -Not on anticoagulation.  Continue Coreg.  CAD status post CABG -EKG without acute changes.  Continue Coreg, aspirin, statin.  Hypothyroidism -Continue Synthroid.    Switch to IV for now  Pressure Ulcer 01/27/15 Stage I -  Intact skin with non-blanchable redness of a localized area usually over a bony prominence. redness. no open areas (Active)  01/27/15 1249  Location: Sacrum  Location Orientation: Left;Right  Staging: Stage I -  Intact skin with non-blanchable redness of a localized area usually over a bony prominence.  Wound Description (Comments): redness. no open areas  Present on Admission: Yes   Diet: cardiac diet DVT Prophylaxis: subcutaneous Heparin  Advance goals of care discussion: full code  Family Communication: no family was present at bedside, at the time of interview.   Disposition:  Discharge to be determined.  Consultants: none Procedures: none  Scheduled Meds: . aspirin EC  81 mg Oral Daily    . clopidogrel  75 mg Oral Daily  . enoxaparin (LOVENOX) injection  30 mg Subcutaneous Q24H  . furosemide  40 mg Intravenous Q12H  . levothyroxine  50 mcg Intravenous Daily  . mouth rinse  15 mL Mouth Rinse BID  . predniSONE  5 mg Oral Daily  . vitamin B-12  1,000 mcg Oral Daily   Continuous Infusions: PRN Meds: acetaminophen, acetaminophen, fluticasone, ipratropium-albuterol Antibiotics: Anti-infectives (From admission, onward)   None       Objective: Physical Exam: Vitals:   05/13/18 0916 05/13/18 1218 05/13/18 1600 05/13/18 1709  BP: (!) 122/55 (!) 118/58  125/63  Pulse: 65 60  64  Resp: 20 16  20   Temp: (!) 97.5 F (36.4 C) (!) 97.5 F (36.4 C)  98.5 F (36.9 C)  TempSrc: Oral Axillary  Oral  SpO2: 94% 100% 95% 96%  Weight:      Height:        Intake/Output Summary (Last 24 hours) at 05/13/2018 1807 Last data filed at 05/13/2018 1711 Gross per 24 hour  Intake 0 ml  Output 2150 ml  Net -2150 ml   Filed Weights   05/12/18 2244 05/13/18 0529  Weight: 67.5 kg 67.5 kg   General: Alert, Awake and Oriented to Time, Place and Person. Appear in moderate distress, affect appropriate Eyes: PERRL, Conjunctiva normal ENT: Oral Mucosa clear moist. Neck: no JVD, no Abnormal Mass Or lumps Cardiovascular: S1 and S2 Present, aortic systolic  Murmur, Peripheral Pulses Present Respiratory: normal respiratory effort, Bilateral Air entry equal and Decreased, no use of accessory muscle, bilateral  Crackles, no wheezes Abdomen: Bowel Sound present, Soft and no tenderness, no hernia Skin: no redness, no Rash, no induration Extremities: no Pedal edema, no calf tenderness Neurologic: Grossly no focal neuro deficit. Bilaterally Equal motor strength  Data Reviewed: CBC: Recent Labs  Lab 05/12/18 1603 05/12/18 1936 05/13/18 0732  WBC 10.6*  --  8.0  HGB 9.2* 9.9* 8.3*  HCT 31.2* 29.0* 28.6*  MCV 92.9  --  91.1  PLT 303  --  260   Basic Metabolic Panel: Recent Labs  Lab  05/12/18 1603 05/12/18 1936 05/13/18 0732  NA 135 136 138  K 4.4 4.4 3.9  CL 97*  --  99  CO2 26  --  28  GLUCOSE 132*  --  88  BUN 26*  --  26*  CREATININE 1.50*  --  1.46*  CALCIUM 8.5*  --  8.4*    Liver Function Tests: Recent Labs  Lab 05/12/18 1603  AST 24  ALT 13  ALKPHOS 62  BILITOT 0.7  PROT 6.8  ALBUMIN 3.4*   No results for input(s): LIPASE, AMYLASE in the last 168 hours. Recent Labs  Lab 05/12/18 2210  AMMONIA 11   Coagulation Profile: No results for input(s): INR, PROTIME in the last 168 hours. Cardiac Enzymes: No results for input(s): CKTOTAL, CKMB, CKMBINDEX, TROPONINI in the last 168 hours. BNP (last 3 results) No results for input(s): PROBNP in the last 8760 hours. CBG: Recent Labs  Lab 05/12/18 1631  GLUCAP 125*   Studies: Dg Chest 2 View  Result Date: 05/12/2018 CLINICAL DATA:  Shortness of breath, altered status. EXAM: CHEST - 2 VIEW COMPARISON:  Chest x-rays dated 04/27/2018 and 12/09/2017. FINDINGS: Stable cardiomegaly. Median sternotomy wires appear intact and stable in alignment. Valvular hardware appears stable in position. Chronic opacities at the bilateral lung bases, presumably chronic  atelectasis or scarring/fibrosis. Mild central pulmonary vascular congestion. Probable small bilateral pleural effusions. No pneumothorax seen. No acute or suspicious osseous finding. IMPRESSION: 1. Stable cardiomegaly. Mild central pulmonary vascular congestion, similar to previous studies, suspected chronic mild CHF. 2. Probable small bilateral pleural effusions. 3. Chronic opacities at the bilateral lung bases, suspected chronic atelectasis or scarring/fibrosis. Electronically Signed   By: Bary Richard M.D.   On: 05/12/2018 19:01   Mr Brain Wo Contrast  Result Date: 05/13/2018 CLINICAL DATA:  Initial evaluation for acute altered mental status, slurred speech. EXAM: MRI HEAD WITHOUT CONTRAST TECHNIQUE: Multiplanar, multiecho pulse sequences of the brain  and surrounding structures were obtained without intravenous contrast. COMPARISON:  Comparison made with prior head CT from 05/12/2018 FINDINGS: Brain: Advanced age-related cerebral atrophy. Extensive patchy and confluent T2/FLAIR hyperintensity throughout the periventricular and deep white matter both cerebral hemispheres, most consistent with severe chronic microvascular ischemic disease. Probable superimposed small remote right parietal cortical infarct. Multiple remote lacunar infarcts seen involving the bilateral basal ganglia and thalami, with multiple additional remote bilateral cerebellar infarcts. No abnormal foci of restricted diffusion to suggest acute or subacute ischemia. Gray-white matter differentiation maintained. No evidence for acute intracranial hemorrhage. Prominent chronic microhemorrhage noted at the pons, likely small vessel related. No mass lesion, midline shift or mass effect. Diffuse ventricular prominence related to global parenchymal atrophy without hydrocephalus. No extra-axial fluid collection. Pituitary gland suprasellar region normal. Vascular: Abnormal flow void within the left ICA to the level of the terminus, likely occluded. Major intracranial vascular flow voids otherwise maintained. Skull and upper cervical spine: Craniocervical junction normal. Scattered degenerative spondylolysis noted within the upper cervical spine without significant stenosis. Bone marrow signal intensity within normal limits. No scalp soft tissue abnormality. Sinuses/Orbits: Patient status post ocular lens replacement on the right. Globes and orbital soft tissues demonstrate no acute finding. Scattered mucosal thickening within the ethmoidal air cells, likely chronic. Bilateral mastoid effusions noted. Inner ear structures grossly normal. Other: None. IMPRESSION: 1. No acute intracranial abnormality. 2. Advanced age-related cerebral atrophy with severe chronic microvascular ischemic disease, with multiple  remote lacunar infarcts involving the bilateral basal ganglia, thalami, and pons, with multiple chronic bilateral cerebellar infarcts. 3. Abnormal flow void within the left ICA to the level of the terminus, likely occluded. Electronically Signed   By: Rise Mu M.D.   On: 05/13/2018 02:26   Dg Chest Port 1 View  Result Date: 05/13/2018 CLINICAL DATA:  Dyspnea EXAM: PORTABLE CHEST 1 VIEW COMPARISON:  05/12/2018 chest radiograph. FINDINGS: Intact sternotomy wires. Aortic valve prosthesis is in place. CABG clips overlie the mediastinum. Stable cardiomediastinal silhouette with mild to moderate cardiomegaly. No pneumothorax. Small bilateral pleural effusions, right greater the left, stable. Worsening linear and fluffy parahilar opacities throughout both lungs. IMPRESSION: Worsening linear and fluffy parahilar opacities throughout both lungs with cardiomegaly and small bilateral pleural effusions, most compatible with worsening moderate to severe congestive heart failure. Electronically Signed   By: Delbert Phenix M.D.   On: 05/13/2018 16:10     Time spent: 35 minutes  Author: Lynden Oxford, MD [facility] 05/13/2018 6:07 PM  To reach On-call, see care teams to locate the attending and reach out to them via [email or web address]. If 7PM-7AM, please contact night-coverage If you still have difficulty reaching the attending provider, please page the [facility] (Director on Call) for [facility] on amion for assistance.

## 2018-05-14 ENCOUNTER — Inpatient Hospital Stay (HOSPITAL_COMMUNITY): Payer: Medicare Other

## 2018-05-14 LAB — COMPREHENSIVE METABOLIC PANEL
ALT: 11 U/L (ref 0–44)
AST: 20 U/L (ref 15–41)
Albumin: 3 g/dL — ABNORMAL LOW (ref 3.5–5.0)
Alkaline Phosphatase: 52 U/L (ref 38–126)
Anion gap: 12 (ref 5–15)
BUN: 24 mg/dL — ABNORMAL HIGH (ref 8–23)
CO2: 29 mmol/L (ref 22–32)
Calcium: 8.3 mg/dL — ABNORMAL LOW (ref 8.9–10.3)
Chloride: 97 mmol/L — ABNORMAL LOW (ref 98–111)
Creatinine, Ser: 1.49 mg/dL — ABNORMAL HIGH (ref 0.61–1.24)
GFR calc Af Amer: 48 mL/min — ABNORMAL LOW (ref >60)
GFR calc non Af Amer: 41 mL/min — ABNORMAL LOW (ref >60)
Glucose, Bld: 82 mg/dL (ref 70–99)
Potassium: 3.9 mmol/L (ref 3.5–5.1)
SODIUM: 138 mmol/L (ref 135–145)
Total Bilirubin: 1.1 mg/dL (ref 0.3–1.2)
Total Protein: 6.4 g/dL — ABNORMAL LOW (ref 6.5–8.1)

## 2018-05-14 LAB — CBC WITH DIFFERENTIAL/PLATELET
ABS IMMATURE GRANULOCYTES: 0.14 10*3/uL — AB (ref 0.00–0.07)
Basophils Absolute: 0.1 10*3/uL (ref 0.0–0.1)
Basophils Relative: 1 %
Eosinophils Absolute: 0.2 10*3/uL (ref 0.0–0.5)
Eosinophils Relative: 2 %
HCT: 29.9 % — ABNORMAL LOW (ref 39.0–52.0)
Hemoglobin: 9 g/dL — ABNORMAL LOW (ref 13.0–17.0)
Immature Granulocytes: 2 %
Lymphocytes Relative: 32 %
Lymphs Abs: 2.7 10*3/uL (ref 0.7–4.0)
MCH: 27.1 pg (ref 26.0–34.0)
MCHC: 30.1 g/dL (ref 30.0–36.0)
MCV: 90.1 fL (ref 80.0–100.0)
Monocytes Absolute: 1 10*3/uL (ref 0.1–1.0)
Monocytes Relative: 11 %
NEUTROS ABS: 4.5 10*3/uL (ref 1.7–7.7)
Neutrophils Relative %: 52 %
Platelets: 283 10*3/uL (ref 150–400)
RBC: 3.32 MIL/uL — AB (ref 4.22–5.81)
RDW: 19.3 % — ABNORMAL HIGH (ref 11.5–15.5)
WBC: 8.5 10*3/uL (ref 4.0–10.5)
nRBC: 0 % (ref 0.0–0.2)

## 2018-05-14 LAB — MAGNESIUM: Magnesium: 2.4 mg/dL (ref 1.7–2.4)

## 2018-05-14 MED ORDER — ENSURE ENLIVE PO LIQD
237.0000 mL | Freq: Three times a day (TID) | ORAL | Status: DC
  Administered 2018-05-14 – 2018-05-17 (×9): 237 mL via ORAL

## 2018-05-14 MED ORDER — FUROSEMIDE 40 MG PO TABS
40.0000 mg | ORAL_TABLET | Freq: Every day | ORAL | Status: DC
  Administered 2018-05-15 – 2018-05-17 (×3): 40 mg via ORAL
  Filled 2018-05-14 (×3): qty 1

## 2018-05-14 MED ORDER — SENNOSIDES-DOCUSATE SODIUM 8.6-50 MG PO TABS
2.0000 | ORAL_TABLET | Freq: Every day | ORAL | Status: DC
  Administered 2018-05-14 – 2018-05-16 (×3): 2 via ORAL
  Filled 2018-05-14 (×2): qty 2

## 2018-05-14 NOTE — Progress Notes (Signed)
Initial Nutrition Assessment  DOCUMENTATION CODES:   Severe malnutrition in context of acute illness/injury  INTERVENTION:   - Ensure Enlive TID (each provides 350 kcal, 20 g protein)   NUTRITION DIAGNOSIS:   Severe Malnutrition related to acute illness as evidenced by percent weight loss, mild muscle depletion, moderate muscle depletion, moderate fat depletion, mild fat depletion.  GOAL:   Patient will meet greater than or equal to 90% of their needs  MONITOR:   PO intake, Supplement acceptance, Diet advancement, Labs, Weight trends  REASON FOR ASSESSMENT:   Malnutrition Screening Tool    ASSESSMENT:   85 yo male, admitted with acute exacerbation of CHF. PMH significant for CVA, GERD, AAA, HTN, hypothyroidism, CAD, chronic systolic CHF with EF 30-35%, paroxysmal atrial fibrillation s/p TAVR, h/o stroke. Presents with AMS, weakness.  Labs: chloride 97, BUN 24, Creatinine 1.49, corrected Ca WNL Meds: Lasix, levothyroxine, vitamin B-12  Pt awake and resting in bed at time of visit. Reports no appetite, unsure for how long. States he had diarrhea a few days ago, but denies any current issues with his bowels. Denies nausea. Encouraged pt to include protein-rich foods with all meals and snacks, and to eat those foods first if not feeling hungry. Amenable to trying Ensure TID in chocolate.  NUTRITION - FOCUSED PHYSICAL EXAM:   Most Recent Value  Orbital Region  Mild depletion  Upper Arm Region  Moderate depletion  Thoracic and Lumbar Region  Moderate depletion  Buccal Region  Moderate depletion  Temple Region  Moderate depletion  Clavicle Bone Region  Moderate depletion  Clavicle and Acromion Bone Region  Moderate depletion  Scapular Bone Region  Moderate depletion  Dorsal Hand  Unable to assess  Patellar Region  Mild depletion  Anterior Thigh Region  Mild depletion  Posterior Calf Region  Unable to assess  Edema (RD Assessment)  None  Hair  Reviewed  Eyes  Reviewed   Mouth  Reviewed  Skin  Reviewed  Nails  Reviewed      Diet Order:   2/15: NPO 2/17: Dysphagia 2, thin liquids Diet Order            DIET DYS 2 Room service appropriate? Yes; Fluid consistency: Thin  Diet effective now            Noted 50% x 2 meals recorded (?while NPO)  EDUCATION NEEDS:  Not appropriate for education at this time  Skin:  Skin Assessment: Skin Integrity Issues: Skin Integrity Issues:: Other (Comment) Other: Ecchymosis BL arms; MASD BL buttocks  Last BM:  2/15  Height:  Ht Readings from Last 1 Encounters:  05/12/18 5\' 5"  (1.651 m)    Weight:  Wt Readings:  05/14/18 62.4 kg  04/30/18 69.8 kg  03/27/18 65.9 kg  03/12/18 65 kg  12/14/17 66.3 kg  Noted 7.4 kg wt loss x 2 weeks = >10% may be indicative of acute malnutrition  Ideal Body Weight:  61.8 kg  BMI:  Body mass index is 22.89 kg/m., normal  Estimated Nutritional Needs:   Kcal:  1550-1860 calories (25-30 kcal/kg ABW)  Protein:  74-93 gm daily (1.2-1.5 g/kg ABW)  Fluid:  1 mL/kcal or per MD  Jolaine Artist, MS, RDN, LDN Pager: [phone] Available Mondays and Fridays, 9am-2pm

## 2018-05-14 NOTE — Clinical Social Work Note (Signed)
Clinical Social Work Assessment  Patient Details  Name: Hunter Choi MRN: [id number] Date of Birth: 05-17-1927  Date of referral:  05/14/18               Reason for consult:  Facility Placement, Discharge Planning                Permission sought to share information with:  Family Supports Permission granted to share information::     Name::     Hunter Choi  Agency::     Relationship::  Son  Contact Information:  [phone]  Housing/Transportation Living arrangements for the past 2 months:  Single Family Home Source of Information:  Medical Team, Adult Children Patient Interpreter Needed:  None Criminal Activity/Legal Involvement Pertinent to Current Situation/Hospitalization:  No - Comment as needed Significant Relationships:  Adult Children Lives with:  Adult Children Do you feel safe going back to the place where you live?  Yes Need for family participation in patient care:  Yes (Comment)  Care giving concerns:  PT recommending SNF placement once medically stable for discharge.   Social Worker assessment / plan:  Patient only oriented to self. No supports at bedside. CSW called son, introduced role, and explained that PT recommendations would be discussed. Patient's son confirmed patient was at [facility] 12/31-1/24. Patient is in his copay days and does not have secondary insurance to cover the $160 per day to go back to SNF. Son would prefer to return home with home health through [facility]. He asked about a shower chair. RNCM aware. No further concerns. CSW signing off as social work intervention is no longer needed.   Employment status:  Retired Database administrator PT Recommendations:  Skilled Nursing Facility Information / Referral to community resources:  Skilled Nursing Facility  Patient/Family's Response to care:  Patient only oriented to self. Patient's son agreeable to return home with HHPT. Patient's son supportive and involved in  patient's care. Patient's son appreciated social work intervention.  Patient/Family's Understanding of and Emotional Response to Diagnosis, Current Treatment, and Prognosis:  Patient only oriented to self. Patient's son has a good understanding of the reason for admission and plan to return home with HHPT at discharge. Patient's son appear happy with hospital care.  Emotional Assessment Appearance:  Appears stated age Attitude/Demeanor/Rapport:  Unable to Assess Affect (typically observed):  Unable to Assess Orientation:  Oriented to Self Alcohol / Substance use:  Never Used Psych involvement (Current and /or in the community):  No (Comment)  Discharge Needs  Concerns to be addressed:  Care Coordination Readmission within the last 30 days:  Yes Current discharge risk:  Cognitively Impaired, Dependent with Mobility Barriers to Discharge:  Continued Medical Work up   Margarito Liner, LCSW 05/14/2018, 2:42 PM

## 2018-05-14 NOTE — Care Management Note (Signed)
Case Management Note  Patient Details  Name: Hunter Choi MRN: [id number] Date of Birth: [birth year]  Subjective/Objective: CHF                  Action/Plan: Patient lives at home; PCP: [name], MD; has private insurance with [facility] with prescription drug coverage; pt is active with [facility] as prior to admission. CM will continue to follow for progression of care.  Expected Discharge Date:    possibly 05/18/2018              Expected Discharge Plan:  Home w Home Health Services  Discharge planning Services  CM Consult  HH Arranged:  RN, Disease Management, PT, OT HH Agency:  [facility] ([facility]  Status of Service:  In process, will continue to follow  Hunter Choi [phone] 05/14/2018, 10:15 AM

## 2018-05-14 NOTE — Progress Notes (Signed)
Physical Therapy Evaluation Patient Details Name: Hunter Choi MRN: [id number] DOB: Apr 28, 1927 Today's Date: 05/14/2018   History of Present Illness  Patient is 85 y/o male presenting with confusion and SOB secondary to acute exacerbation of CHF. CXR revealed worsening moderate to severe CHF. PMH includes RA, CAD s/p CABG, CVA, AAA, GERD, a fib, s/p TAVR.   Clinical Impression  Patient admitted to hospital secondary to problems above and with deficits below. Patient required heavy modA to transfer to chair. Patient reports he requires assist to transfer to wheel chair at baseline. Given functional mobility and cognitive deficits, recommending SNF level therapy at this time. No family/caregiver present during session to determine baseline. If patient is near baseline and has necessary support at home, may be able to progress to home with HHPT and 24 hour supervision. Patient will benefit from acute physical therapy to maximize independence and safety with functional mobility.     Follow Up Recommendations SNF;Supervision/Assistance - 24 hour    Equipment Recommendations  None recommended by PT    Recommendations for Other Services OT consult     Precautions / Restrictions Precautions Precautions: Fall Restrictions Weight Bearing Restrictions: No      Mobility  Bed Mobility Overal bed mobility: Needs Assistance Bed Mobility: Supine to Sit     Supine to sit: Supervision     General bed mobility comments: Patient required supervision for bed mobility for safety. Reports mild dizziness upon sitting  Transfers Overall transfer level: Needs assistance Equipment used: Rolling walker (2 wheeled);None Transfers: Sit to/from [employer];Stand Pivot Transfers Sit to Stand: Min assist Stand pivot transfers: Mod assist       General transfer comment: Patient required heavy modA to transfer to chair for safety and steadying with therapist positioned in front of patient.  Patient required  minA to stand x2 with RW but unable to step and complete transfer with use of RW. Patient with significant posterior lean with LE bracing on bed to improve stability. Patient with increased SOB during transfer. Oxygen saturations WFL during functional mobility. Reports son helps him transfer at home.     Ambulation/Gait                Stairs            Wheelchair Mobility    Modified Rankin (Stroke Patients Only)       Balance Overall balance assessment: Needs assistance Sitting-balance support: No upper extremity supported Sitting balance-Leahy Scale: Good     Standing balance support: Bilateral upper extremity supported Standing balance-Leahy Scale: Poor Standing balance comment: reliant on BUE support and external assistance to maintain static standing balance                             Pertinent Vitals/Pain Pain Assessment: Faces Faces Pain Scale: No hurt    Home Living Family/patient expects to be discharged to:: Private residence Living Arrangements: Children Available Help at Discharge: Family;Available 24 hours/day Type of Home: House Home Access: Level entry     Home Layout: One level Home Equipment: Walker - 2 wheels;Wheelchair - manual;Bedside commode Additional Comments: Some home information from previous notes    Prior Function Level of Independence: Needs assistance   Gait / Transfers Assistance Needed: Reports he has been completing transfers to wheelchair with assistance from son  ADL's / Homemaking Assistance Needed: Per previous notes, sons assist with ADLs         Hand Dominance  Extremity/Trunk Assessment   Upper Extremity Assessment Upper Extremity Assessment: Defer to OT evaluation    Lower Extremity Assessment Lower Extremity Assessment: Generalized weakness    Cervical / Trunk Assessment Cervical / Trunk Assessment: Normal  Communication   Communication: HOH  Cognition Arousal/Alertness:  Awake/alert Behavior During Therapy: WFL for tasks assessed/performed Overall Cognitive Status: No family/caregiver present to determine baseline cognitive functioning                                 General Comments: A&O x2 (person, place). No caregiver or family present to determine accuracy of home information      General Comments General comments (skin integrity, edema, etc.): Oxygen saturations WFL during functional mobility. BP 134/59 and HR 68 in supine prior to mobility. No caregiver or family present during session.     Exercises     Assessment/Plan    PT Assessment Patient needs continued PT services  PT Problem List Decreased strength;Decreased range of motion;Decreased activity tolerance;Decreased balance;Decreased mobility;Decreased cognition;Decreased knowledge of precautions;Decreased knowledge of use of DME       PT Treatment Interventions DME instruction;Gait training;Functional mobility training;Therapeutic activities;Therapeutic exercise;Balance training;Patient/family education    PT Goals (Current goals can be found in the Care Plan section)  Acute Rehab PT Goals Patient Stated Goal: go home PT Goal Formulation: With patient Time For Goal Achievement: 05/28/18 Potential to Achieve Goals: Good    Frequency Min 2X/week   Barriers to discharge        Co-evaluation               AM-PAC PT "6 Clicks" Mobility  Outcome Measure Help needed turning from your back to your side while in a flat bed without using bedrails?: None Help needed moving from lying on your back to sitting on the side of a flat bed without using bedrails?: None Help needed moving to and from a bed to a chair (including a wheelchair)?: A Lot Help needed standing up from a chair using your arms (e.g., wheelchair or bedside chair)?: A Lot Help needed to walk in hospital room?: A Lot Help needed climbing 3-5 steps with a railing? : Total 6 Click Score: 15    End of  Session Equipment Utilized During Treatment: Gait belt Activity Tolerance: Patient tolerated treatment well Patient left: in chair;with call bell/phone within reach;with chair alarm set Nurse Communication: Mobility status PT Visit Diagnosis: Unsteadiness on feet (R26.81);Other abnormalities of gait and mobility (R26.89);Muscle weakness (generalized) (M62.81);Difficulty in walking, not elsewhere classified (R26.2)    Time: 1314-3888 PT Time Calculation (min) (ACUTE ONLY): 21 min   Charges:   PT Evaluation $PT Eval Moderate Complexity: 1 Mod          Vanessa Ralphs, SPT  Vanessa Ralphs 05/14/2018, 1:30 PM

## 2018-05-14 NOTE — Progress Notes (Addendum)
Modified Barium Swallow Progress Note  Patient Details  Name: Hunter Choi MRN: [id number] Date of Birth: 14-Apr-1927  Today's Date: 05/14/2018  Modified Barium Swallow completed.  Full report located under Chart Review in the Imaging Section.  Brief recommendations include the following:  Clinical Impression  Pt presents with mild pharyngoesophageal dysphagia c/b inconsistent clearance of penetrate, mild- moderate pharyngeal residue, esophageal backflow into pharynx and pill hung in cervical esophagus. No aspiration observed with small sips of thin via teaspoon and cup. When freely drinking with straw and during pill intake, pt penetrated thin above cords and inconsistenly ejected it via strength of laryngeal closure, independent throat clear x2 and min verbal cues to cough. Only trace penetrate noted when pt unable to fully clear. Mild- moderate pharyngeal residue in vallecula and pyriform sinuses observed largely with thin liquid and less so with regular textured solid. With pharyngeal strength WFL, suspect increased residue with thin to be more esophageal related. Chin tuck with thin attempted to clear residue and increase airway protection, but was observed to be ineffective. Clearance successful with multiple independent swallows and cues for liquid wash. Esophageal backflow into pharynx x2 observed with thin alone and thin with pill. Pill with thin followed by puree noted to be hung in cervical esophagus. Recommend dys 2, thin liquid diet. Pt should take small cup sips and cough intermittently with liquid intake to increase clearance of possible penetrate. Given suspected esophageal issues, pt should be seated upright during meals and 30 minutes after and have meds crushed with puree. Consider esophageal work-up.    Swallow Evaluation Recommendations   Recommended Consults: Consider esophageal assessment   SLP Diet Recommendations: Dysphagia 2 (Fine chop) solids;Thin liquid   Liquid  Administration via: Cup;No straw   Medication Administration: Crushed with puree   Supervision: Staff to assist with self feeding;Intermittent supervision to cue for compensatory strategies   Compensations: Small sips/bites(cough intermittently)   Postural Changes: Seated upright at 90 degrees;Remain semi-upright after after feeds/meals (Comment)   Oral Care Recommendations: Oral care BID        Gardiner Ramus, Student SLP 05/14/2018,12:37 PM

## 2018-05-14 NOTE — Progress Notes (Signed)
[facility] Progress Note  Patient: Hunter Choi ZOX:[id number]   PCP: [name], MD DOB: 1927-09-24   DOA: 05/12/2018   DOS: 05/14/2018   Date of Service: the patient was seen and examined on 05/14/2018  Brief hospital course: Pt. with PMH of rheumatoid arthritis on methotrexate and prednisone, CVA, GERD, AAA, BPH, hypothyroidism, CAD, chronic systolic congestive heart failure with EF 30-35%, paroxysmal atrial fibrillation, status post TAVR; admitted on 05/12/2018, presented with complaint of confusion and shortness of breath, was found to have acute CHF. Currently further plan is continue IV diuresis.  Subjective: feeling better, no fever or chills. No chest pain, no shortnes of breath.   Assessment and Plan: Acute exacerbation of chronic systolic congestive heart failure -Family reported 3-day history of labored breathing.  Patient is not hypoxic and satting well on room air.  No increased work of breathing. VBG showing pH 7.44. -BNP greater than 4500, above patient's baseline. Chest x-ray showing stable cardiomegaly, mild central pulmonary vascular congestion, and small bilateral pleural effusions. -Echo done May 2019 showing EF 30 to 35%. -PE less likely as not hypoxic or tachycardic.  No clinical signs of DVT on exam. -Received IV Lasix 40 mg in the ED.  change IV Lasix 40 mg twice daily to oral -Monitor intake and output -Daily weights -Echocardiogram from prior no significant difference.  30 to 35% EF. -Currently not wheezing on exam.  Per ED provider, noted to have mild wheezing on exam.  No documented history of COPD or asthma. -Continue DuoNebs every 6 hours.  Acute metabolic encephalopathy -Presenting with a 1 day history of confusion and hallucinations. -CT head negative for acute finding. -Blood ethanol level negative.   -Although white count borderline elevated at 10.6, infectious etiology less likely to explain patient's acute encephalopathy as he is  afebrile. UA not suggestive of infection.  Chest x-ray without evidence of pneumonia. -No sedating medications listed on home medication list. -MRI brain negative -UDS negative -normal TSH, B12, ammonia levels -Frequent neuro checks unremarkable. -Blood culture x2 negative  -Influenza panel negative -Continue to monitor white blood cell count -Modified barium swallow ordered.  Will monitor.  Chronic anemia -Stable.  Hemoglobin 9.2, at baseline.  CKD 3 -BUN 26.  Creatinine 1.5, 1.2-1.4 during recent hospitalization. -Continue to monitor renal function with IV diuresis. -Avoid nephrotoxic agents/contrast. -Monitor urine output.  Rheumatoid arthritis -Hold home methotrexate and continue prednisone.  History of CVA -Continue aspirin.  Hyperlipidemia -Continue Lipitor.  Paroxysmal atrial fibrillation -Not on anticoagulation.  Continue Coreg.  CAD status post CABG -EKG without acute changes.  Continue Coreg, aspirin, statin.  Hypothyroidism -Continue Synthroid.  Pressure Ulcer 01/27/15 Stage I -  Intact skin with non-blanchable redness of a localized area usually over a bony prominence. redness. no open areas (Active)  01/27/15 1249  Location: Sacrum  Location Orientation: Left;Right  Staging: Stage I -  Intact skin with non-blanchable redness of a localized area usually over a bony prominence.  Wound Description (Comments): redness. no open areas  Present on Admission: Yes   Diet: cardiac diet DVT Prophylaxis: subcutaneous Heparin  Advance goals of care discussion: full code  Family Communication: no family was present at bedside, at the time of interview.   Disposition:  Discharge to SNF.  Consultants: none Procedures: none  Scheduled Meds: . aspirin EC  81 mg Oral Daily  . clopidogrel  75 mg Oral Daily  . enoxaparin (LOVENOX) injection  30 mg Subcutaneous Q24H  . feeding supplement (ENSURE ENLIVE)  237  mL Oral TID BM  . [START ON 05/15/2018]  furosemide  40 mg Oral Daily  . mouth rinse  15 mL Mouth Rinse BID  . predniSONE  5 mg Oral Daily  . senna-docusate  2 tablet Oral QHS  . vitamin B-12  1,000 mcg Oral Daily   Continuous Infusions: PRN Meds: acetaminophen, acetaminophen, fluticasone, ipratropium-albuterol Antibiotics: Anti-infectives (From admission, onward)   None       Objective: Physical Exam: Vitals:   05/14/18 0827 05/14/18 1245 05/14/18 1615 05/14/18 1934  BP: (!) 97/33 (!) 112/57 (!) 121/59 (!) 111/51  Pulse: 63 66 67 62  Resp:  18 16 18   Temp: 98.9 F (37.2 C) 98.6 F (37 C) 99.2 F (37.3 C) 99.1 F (37.3 C)  TempSrc: Oral Oral Oral Oral  SpO2: 98% 94% 92% 96%  Weight:      Height:        Intake/Output Summary (Last 24 hours) at 05/14/2018 2045 Last data filed at 05/14/2018 1600 Gross per 24 hour  Intake 720 ml  Output 2300 ml  Net -1580 ml   Filed Weights   05/12/18 2244 05/13/18 0529 05/14/18 0614  Weight: 67.5 kg 67.5 kg 62.4 kg   General: Alert, Awake and Oriented to Time, Place and Person. Appear in moderate distress, affect appropriate Eyes: PERRL, Conjunctiva normal ENT: Oral Mucosa clear moist. Neck: no JVD, no Abnormal Mass Or lumps Cardiovascular: S1 and S2 Present, aortic systolic  Murmur, Peripheral Pulses Present Respiratory: normal respiratory effort, Bilateral Air entry equal and Decreased, no use of accessory muscle, bilateral  Crackles, no wheezes Abdomen: Bowel Sound present, Soft and no tenderness, no hernia Skin: no redness, no Rash, no induration Extremities: no Pedal edema, no calf tenderness Neurologic: Grossly no focal neuro deficit. Bilaterally Equal motor strength  Data Reviewed: CBC: Recent Labs  Lab 05/12/18 1603 05/12/18 1936 05/13/18 0732 05/14/18 0621  WBC 10.6*  --  8.0 8.5  NEUTROABS  --   --   --  4.5  HGB 9.2* 9.9* 8.3* 9.0*  HCT 31.2* 29.0* 28.6* 29.9*  MCV 92.9  --  91.1 90.1  PLT 303  --  260 283   Basic Metabolic Panel: Recent Labs  Lab  05/12/18 1603 05/12/18 1936 05/13/18 0732 05/14/18 0621  NA 135 136 138 138  K 4.4 4.4 3.9 3.9  CL 97*  --  99 97*  CO2 26  --  28 29  GLUCOSE 132*  --  88 82  BUN 26*  --  26* 24*  CREATININE 1.50*  --  1.46* 1.49*  CALCIUM 8.5*  --  8.4* 8.3*  MG  --   --   --  2.4    Liver Function Tests: Recent Labs  Lab 05/12/18 1603 05/14/18 0621  AST 24 20  ALT 13 11  ALKPHOS 62 52  BILITOT 0.7 1.1  PROT 6.8 6.4*  ALBUMIN 3.4* 3.0*   No results for input(s): LIPASE, AMYLASE in the last 168 hours. Recent Labs  Lab 05/12/18 2210  AMMONIA 11   Coagulation Profile: No results for input(s): INR, PROTIME in the last 168 hours. Cardiac Enzymes: No results for input(s): CKTOTAL, CKMB, CKMBINDEX, TROPONINI in the last 168 hours. BNP (last 3 results) No results for input(s): PROBNP in the last 8760 hours. CBG: Recent Labs  Lab 05/12/18 1631  GLUCAP 125*   Studies: Dg Swallowing Func-speech Pathology  Result Date: 05/14/2018 Objective Swallowing Evaluation: Type of Study: MBS-Modified Barium Swallow Study  Patient Details Name:  Hunter Choi MRN: [id number] Date of Birth: [birth year] Today's Date: 05/14/2018 Time: SLP Start Time (ACUTE ONLY): 1005 -SLP Stop Time (ACUTE ONLY): 1020 SLP Time Calculation (min) (ACUTE ONLY): 15 min Past Medical History: Past Medical History: Diagnosis Date . AAA (abdominal aortic aneurysm) (HCC)   Dr. Ashley Royalty . Aortic stenosis, severe   a. s/p TAVR . Blind left eye  . BPH (benign prostatic hypertrophy)   Dr. Retta Diones . CAD (coronary artery disease) of bypass graft   Chronic occlusion of saphenous vein graft to RCA . Coronary artery disease   Left main disease and severe 3-vessel CAD . Diastolic heart failure (HCC)  . Embolism involving retinal artery  . GERD (gastroesophageal reflux disease)  . Hard of hearing  . Hypertension  . Hypothyroidism  . Iliac artery aneurysm (HCC)   Dr. Edilia Bo . PAF (paroxysmal atrial fibrillation) (HCC)  . S/P TAVR (transcatheter  aortic valve replacement) 01/15/2013  a. 12/2012: mm Stephannie Peters XT transcatheter heart valve placed via transapical approach . Stroke [facility])   mini stroke effective his eye left Past Surgical History: Past Surgical History: Procedure Laterality Date . BIOPSY PROSTATE  2005 . BUNIONECTOMY WITH HAMMERTOE RECONSTRUCTION Bilateral  . CARDIAC CATHETERIZATION   . CARDIAC CATHETERIZATION N/A 05/07/2015  Procedure: Right Heart Cath and Coronary/Graft Angiography;  Surgeon: Lyn Records, MD;  Location: [facility] INVASIVE CV LAB;  Service: Cardiovascular;  Laterality: N/A; . CAROTID ENDARTERECTOMY Right Jan. 2, 1997 . CATARACT EXTRACTION W/ INTRAOCULAR LENS IMPLANT Right  . CHOLECYSTECTOMY N/A 01/28/2015  Procedure: LAPAROSCOPIC CHOLECYSTECTOMY WITH INTRAOPERATIVE CHOLANGIOGRAM;  Surgeon: Gaynelle Adu, MD;  Location: [facility] OR;  Service: General;  Laterality: N/A; . COLONOSCOPY   . CORONARY ARTERY BYPASS GRAFT  01/1998  Dr. Sheliah Plane; LIMA to LAD, SVG to D1, SVG to LCx, SVG to RCA, open saphenous vein harvest via right lower extremity . HARVEST BONE GRAFT  1982  FOR THE  ANKLE . HEMIARTHROPLASTY HIP Left   AFTER HIP FX . INTRAOPERATIVE TRANSESOPHAGEAL ECHOCARDIOGRAM N/A 01/15/2013  Procedure: INTRAOPERATIVE TRANSESOPHAGEAL ECHOCARDIOGRAM;  Surgeon: [name], MD;  Location: [facility] OR;  Service: Open Heart Surgery;  Laterality: N/A; . JOINT REPLACEMENT   . LEFT AND RIGHT HEART CATHETERIZATION WITH CORONARY ANGIOGRAM N/A 12/20/2012  Procedure: LEFT AND RIGHT HEART CATHETERIZATION WITH CORONARY ANGIOGRAM;  Surgeon: Lesleigh Noe, MD;  Location: [facility] CATH LAB;  Service: Cardiovascular;  Laterality: N/A; . LEFT HEART CATH AND CORS/GRAFTS ANGIOGRAPHY N/A 02/14/2017  Procedure: LEFT HEART CATH AND CORS/GRAFTS ANGIOGRAPHY;  Surgeon: Marykay Lex, MD;  Location: [facility] INVASIVE CV LAB;  Service: Cardiovascular;  Laterality: N/A; . SKIN GRAFTS  1968 . TOTAL HIP ARTHROPLASTY Left 2007 . TOTAL KNEE ARTHROPLASTY  1992-2002  right-left .  TRANSCATHETER AORTIC VALVE REPLACEMENT, TRANSAPICAL N/A 01/15/2013  Procedure: TRANSCATHETER AORTIC VALVE REPLACEMENT, TRANSAPICAL;  Surgeon: [name], MD;  Location: [facility] OR;  Service: Open Heart Surgery;  Laterality: N/A; . TRANSURETHRAL RESECTION OF PROSTATE  2012 HPI: Hunter Choi is a 85 y.o. male with medical history significant of rheumatoid arthritis on methotrexate and prednisone, CVA, GERD, AAA, BPH, hypothyroidism, CAD, chronic systolic congestive heart failure with EF 30-35%, paroxysmal atrial fibrillation, status post TAVR presenting to the hospital for evaluation of altered mental status, generalized weakness, and labored breathing.  Patient is confused and mumbling words.  No history could be obtained from him.  When asked what his name was he replied " you are welcome" and " thank you."  No family available at this  time.  MRI of the head is showing no acute abnomality, however, mutliple remote lacunar infarcts in the bilateral basal ganglia, thalami and pons as well as multiple chronic bilateral cerebellar infarcts.  Chest xray is showing mild central pulmonary vascular congestion, suspected chronic mild CHF, probable small bilateral pleural effusions and chronic opacities in the bilateral lung bases, suspected chronic atelectasis or scarring/fibrosis.   Subjective: Pt upright in chair. He was pleasant and cooperative during eval Assessment / Plan / Recommendation CHL IP CLINICAL IMPRESSIONS 05/14/2018 Clinical Impression Pt presents with mild pharyngoesophageal dysphagia c/b  timely swallow initiaiton, inconsistent clearance of penetrate, esophageal backflow into pharynx and pill hung in cervical esophagus. Small sips of thin via teaspoon and cup yielded no s/sx of aspiration. When freely drinking with straw and during pill intake, pt penetrated thin above cords and inconsistenly ejected it via strength of laryngeal closure, independent throat clear x2 and min verbal cues to cough. Only trace  penetrate noted when pt unable to fully clear. Mild- mod residue observed with puree and regular textured solid but was cleared with successive indpendent swallows and cues for liquid wash. Pill with thin followed by puree noted to be hung in cervical esophagus. Given improvment  SLP Visit Diagnosis Dysphagia, pharyngoesophageal phase (R13.14) Attention and concentration deficit following -- Frontal lobe and executive function deficit following -- Impact on safety and function Moderate aspiration risk   CHL IP TREATMENT RECOMMENDATION 05/14/2018 Treatment Recommendations Therapy as outlined in treatment plan below   Prognosis 05/14/2018 Prognosis for Safe Diet Advancement Good Barriers to Reach Goals -- Barriers/Prognosis Comment -- CHL IP DIET RECOMMENDATION 05/14/2018 SLP Diet Recommendations Dysphagia 2 (Fine chop) solids;Thin liquid Liquid Administration via Cup;No straw Medication Administration Crushed with puree Compensations Small sips/bites Postural Changes Seated upright at 90 degrees;Remain semi-upright after after feeds/meals (Comment)   CHL IP OTHER RECOMMENDATIONS 05/14/2018 Recommended Consults Consider esophageal assessment Oral Care Recommendations Oral care BID Other Recommendations --   CHL IP FOLLOW UP RECOMMENDATIONS 05/14/2018 Follow up Recommendations Other (comment)   CHL IP FREQUENCY AND DURATION 05/14/2018 Speech Therapy Frequency (ACUTE ONLY) min 2x/week Treatment Duration 2 weeks      CHL IP ORAL PHASE 05/14/2018 Oral Phase WFL Oral - Pudding Teaspoon -- Oral - Pudding Cup -- Oral - Honey Teaspoon -- Oral - Honey Cup -- Oral - Nectar Teaspoon -- Oral - Nectar Cup -- Oral - Nectar Straw -- Oral - Thin Teaspoon -- Oral - Thin Cup -- Oral - Thin Straw -- Oral - Puree -- Oral - Mech Soft -- Oral - Regular -- Oral - Multi-Consistency -- Oral - Pill -- Oral Phase - Comment --  CHL IP PHARYNGEAL PHASE 05/14/2018 Pharyngeal Phase Impaired Pharyngeal- Pudding Teaspoon -- Pharyngeal -- Pharyngeal- Pudding  Cup -- Pharyngeal -- Pharyngeal- Honey Teaspoon -- Pharyngeal -- Pharyngeal- Honey Cup -- Pharyngeal -- Pharyngeal- Nectar Teaspoon -- Pharyngeal -- Pharyngeal- Nectar Cup -- Pharyngeal -- Pharyngeal- Nectar Straw -- Pharyngeal -- Pharyngeal- Thin Teaspoon WFL Pharyngeal -- Pharyngeal- Thin Cup WFL;Penetration/Aspiration during swallow Pharyngeal Material enters airway, remains ABOVE vocal cords then ejected out;Material enters airway, remains ABOVE vocal cords and not ejected out Pharyngeal- Thin Straw Penetration/Aspiration during swallow;Compensatory strategies attempted (with notebox) Pharyngeal Material enters airway, remains ABOVE vocal cords then ejected out;Material enters airway, remains ABOVE vocal cords and not ejected out Pharyngeal- Puree WFL Pharyngeal -- Pharyngeal- Mechanical Soft -- Pharyngeal -- Pharyngeal- Regular WFL;Pharyngeal residue - valleculae Pharyngeal -- Pharyngeal- Multi-consistency -- Pharyngeal -- Pharyngeal- Pill WFL Pharyngeal -- Pharyngeal  Comment --  CHL IP CERVICAL ESOPHAGEAL PHASE 05/14/2018 Cervical Esophageal Phase Impaired Pudding Teaspoon -- Pudding Cup -- Honey Teaspoon -- Honey Cup -- Nectar Teaspoon -- Nectar Cup -- Nectar Straw -- Thin Teaspoon -- Thin Cup -- Thin Straw Esophageal backflow into the pharynx;Reduced cricopharyngeal relaxation Puree -- Mechanical Soft -- Regular -- Multi-consistency -- Pill Esophageal backflow into the pharynx;Reduced cricopharyngeal relaxation Cervical Esophageal Comment (No Data) Skip Mayer 05/14/2018, 2:08 PM  Note populated on behalf of Gardiner Ramus, Student SLP Natalia Leatherwood, M.A. CCC-SLP Acute Rehabilitation Services Pager [phone] Office [phone]               Time spent: 35 minutes  Author: Lynden Oxford, MD [facility] 05/14/2018 8:45 PM  To reach On-call, see care teams to locate the attending and reach out to them via [email or web address]. If 7PM-7AM, please contact night-coverage If you still have  difficulty reaching the attending provider, please page the [facility] (Director on Call) for [facility] on amion for assistance.

## 2018-05-15 ENCOUNTER — Inpatient Hospital Stay (HOSPITAL_COMMUNITY): Payer: Medicare Other

## 2018-05-15 NOTE — Evaluation (Signed)
Occupational Therapy Evaluation Patient Details Name: Hunter Choi MRN: [id number] DOB: [birth year] Today's Date: 05/15/2018    History of Present Illness Patient is 85 y/o male presenting with confusion and SOB secondary to acute exacerbation of CHF. CXR revealed worsening moderate to severe CHF. PMH includes RA, CAD s/p CABG, CVA, AAA, GERD, a fib, s/p TAVR.    Clinical Impression   This 85 y/o male presents with the above. PTA pt reports he was living with his sons, receiving assist for ADL completion and for transfers to/from wheelchair. Limited eval this session as pt refusing mobility OOB (initially agreeable to sit EOB). He currently requires modA for UB ADL, totalA for LB ADL at bed level. Pt also with UE weakness and baseline fine motor deficits. He will benefit from continued acute OT services and recommend SNF level therapy services at time of discharge to maximize his strength, safety and independence with ADL and mobility, as well as to decrease level of caregiver burden after return home. Will follow.     Follow Up Recommendations  SNF;Supervision/Assistance - 24 hour    Equipment Recommendations  None recommended by OT;Other (comment)(TBD in next venue)           Precautions / Restrictions Precautions Precautions: Fall Restrictions Weight Bearing Restrictions: No      Mobility Bed Mobility Overal bed mobility: Needs Assistance             General bed mobility comments: pt initially agreeable to sit EOB but then refusing once blankets pulled back and bedrail lowered   Transfers                      Balance Overall balance assessment: Needs assistance                                         ADL either performed or assessed with clinical judgement   ADL Overall ADL's : Needs assistance/impaired Eating/Feeding: Set up;Supervision/ safety;Sitting;Bed level   Grooming: Set up;Bed level   Upper Body Bathing: Bed level;Moderate  assistance   Lower Body Bathing: Total assistance;Bed level   Upper Body Dressing : Bed level;Moderate assistance   Lower Body Dressing: Total assistance;Bed level       Toileting- Clothing Manipulation and Hygiene: Total assistance;Bed level         General ADL Comments: limited eval, pt initially agreeable to sitting EOB but then refusing     Vision         Perception     Praxis      Pertinent Vitals/Pain Pain Assessment: No/denies pain     Hand Dominance Right(reports uses both)   Extremity/Trunk Assessment Upper Extremity Assessment Upper Extremity Assessment: RUE deficits/detail;LUE deficits/detail;Generalized weakness RUE Deficits / Details: grossly 3-/5 (pt using LUE to assist raising RUE), pt with baseline RA and notable deformities in hand/digits LUE Deficits / Details: grossly 3/5, pt with baseline RA and notable deformities in hand/digits   Lower Extremity Assessment Lower Extremity Assessment: Defer to PT evaluation   Cervical / Trunk Assessment Cervical / Trunk Assessment: Normal   Communication Communication Communication: HOH   Cognition Arousal/Alertness: Awake/alert Behavior During Therapy: WFL for tasks assessed/performed Overall Cognitive Status: No family/caregiver present to determine baseline cognitive functioning  General Comments: A&O x3 (person, place, month, requires assist for year). No caregiver or family present to determine accuracy of home information. overall follows one step commands given multimodal cues (pt also HOH)    General Comments       Exercises     Shoulder Instructions      Home Living Family/patient expects to be discharged to:: Private residence Living Arrangements: Children Available Help at Discharge: Family;Available 24 hours/day Type of Home: House Home Access: Level entry     Home Layout: One level     Bathroom Shower/Tub: Tub/shower unit;Walk-in  shower(may need to clarify)   Bathroom Toilet: Standard Bathroom Accessibility: Yes   Home Equipment: Walker - 2 wheels;Wheelchair - manual;Bedside commode   Additional Comments: Some home information from previous notes; pt reports he has walk-in shower, per chart has tub/shower       Prior Functioning/Environment Level of Independence: Needs assistance  Gait / Transfers Assistance Needed: Reports he has been completing transfers to wheelchair with assistance from son ADL's / Homemaking Assistance Needed: reports sons assist with ADLs             OT Problem List: Decreased strength;Decreased range of motion;Decreased activity tolerance;Decreased cognition;Decreased coordination;Impaired balance (sitting and/or standing)      OT Treatment/Interventions: Self-care/ADL training;Energy conservation;DME and/or AE instruction;Therapeutic activities;Patient/family education;Balance training    OT Goals(Current goals can be found in the care plan section) Acute Rehab OT Goals Patient Stated Goal: go home OT Goal Formulation: With patient Time For Goal Achievement: 05/29/18 Potential to Achieve Goals: Fair  OT Frequency: Min 2X/week   Barriers to D/C:            Co-evaluation              AM-PAC OT "6 Clicks" Daily Activity     Outcome Measure Help from another person eating meals?: A Little Help from another person taking care of personal grooming?: A Little Help from another person toileting, which includes using toliet, bedpan, or urinal?: Total Help from another person bathing (including washing, rinsing, drying)?: A Lot Help from another person to put on and taking off regular upper body clothing?: A Lot Help from another person to put on and taking off regular lower body clothing?: Total 6 Click Score: 12   End of Session Nurse Communication: Mobility status  Activity Tolerance: Other (comment)(pt self-limiting ) Patient left: in bed;with call bell/phone within  reach;with bed alarm set  OT Visit Diagnosis: Muscle weakness (generalized) (M62.81)                Time: [id number] OT Time Calculation (min): 11 min Charges:  OT General Charges $OT Visit: 1 Visit OT Evaluation $OT Eval Moderate Complexity: 1 Mod   Marcy SirenBreanna Seith Aikey, OT [employer] [facility] Services Pager [phone] Office [phone]    Orlando PennerBreanna L Shenea Giacobbe 05/15/2018, 3:28 PM

## 2018-05-15 NOTE — Plan of Care (Signed)
  Problem: Clinical Measurements: Goal: Will remain free from infection Outcome: Progressing   Problem: Safety: Goal: Ability to remain free from injury will improve Outcome: Progressing   

## 2018-05-15 NOTE — Care Management Important Message (Signed)
Important Message  Patient Details  Name: Hunter Choi MRN: [id number] Date of Birth: 07/16/1927   Medicare Important Message Given:  Yes    Vennessa Affinito P [name] 05/15/2018, 12:14 PM

## 2018-05-15 NOTE — Progress Notes (Signed)
  Speech Language Pathology Treatment: Dysphagia  Patient Details Name: Hunter Choi MRN: [id number] DOB: January 18, 1928 Today's Date: 05/15/2018 Time: 3614-4315 SLP Time Calculation (min) (ACUTE ONLY): 17 min  Assessment / Plan / Recommendation Clinical Impression  Pt had occasional coughing, with immediate coughing that occurred when pt tried to follow trials of soft solids with liquid washes. Question the impact of vallecular residue on airway protection given results of MBS on previous date. Pt also had delayed coughing, which could be related to backflow that was also observed on MBS. When given Min cues for small bites/sips, using aspiration precautions with textures from current diet level, pt had occasional throat clearing but no further coughing. Throat clearing on previous date was effective at clearing penetrates when taking thin liquids by small cup sips. CXR shows improving aeration today. Recommend continuing current diet and precautions with additional SLP f/u indicated to assess for tolerance.   HPI HPI: Patient is 85 y/o male presenting with confusion and SOB secondary to acute exacerbation of CHF. CXR revealed worsening moderate to severe CHF. MRI negative for acute changes. PMH includes RA, CAD s/p CABG, CVA, AAA, GERD, a fib, s/p TAVR.       SLP Plan  Continue with current plan of care       Recommendations  Diet recommendations: Dysphagia 2 (fine chop);Thin liquid Liquids provided via: Cup;No straw Medication Administration: Crushed with puree Supervision: Staff to assist with self feeding;Full supervision/cueing for compensatory strategies Compensations: Slow rate;Small sips/bites;Other (Comment)(cough intermittently) Postural Changes and/or Swallow Maneuvers: Seated upright 90 degrees;Upright 30-60 min after meal                Oral Care Recommendations: Oral care BID Follow up Recommendations: Skilled Nursing facility SLP Visit Diagnosis: Dysphagia,  pharyngoesophageal phase (R13.14) Plan: Continue with current plan of care       GO                Virl Axe Kaniesha Barile 05/15/2018, 4:44 PM  Natalia Leatherwood, M.A. CCC-SLP Acute Herbalist [phone] Office [phone]

## 2018-05-15 NOTE — Progress Notes (Signed)
RN notified by CCMD of cardiac arrythmia. Pt. Resting and asymptomatic. TRH paged to make aware. RN will make oncoming RN aware.

## 2018-05-15 NOTE — Plan of Care (Signed)
  Problem: Elimination: Goal: Will not experience complications related to bowel motility Outcome: Progressing   Problem: Safety: Goal: Ability to remain free from injury will improve Outcome: Progressing   Problem: Activity: Goal: Risk for activity intolerance will decrease Outcome: Not Progressing

## 2018-05-15 NOTE — Progress Notes (Signed)
[facility] Progress Note  Patient: Hunter Choi PQZ:[id number]   PCP: [name], MD DOB: 07-May-1927   DOA: 05/12/2018   DOS: 05/15/2018   Date of Service: the patient was seen and examined on 05/15/2018  Brief hospital course: Pt. with PMH of rheumatoid arthritis on methotrexate and prednisone, CVA, GERD, AAA, BPH, hypothyroidism, CAD, chronic systolic congestive heart failure with EF 30-35%, paroxysmal atrial fibrillation, status post TAVR; admitted on 05/12/2018, presented with complaint of confusion and shortness of breath, was found to have acute CHF. Currently further plan is continue IV diuresis.  Subjective: Has more cough.  No nausea no vomiting.  Denies any acute complaint otherwise.  No fever no chills.  Assessment and Plan: Acute exacerbation of chronic systolic congestive heart failure -Family reported 3-day history of labored breathing.  Patient is not hypoxic and satting well on room air.  No increased work of breathing. VBG showing pH 7.44. -BNP greater than 4500, above patient's baseline. Chest x-ray showing stable cardiomegaly, mild central pulmonary vascular congestion, and small bilateral pleural effusions. -Echo done May 2019 showing EF 30 to 35%. -PE less likely as not hypoxic or tachycardic.  No clinical signs of DVT on exam. -Received IV Lasix 40 mg in the ED.  change IV Lasix 40 mg twice daily to oral, increasing from 40 mg daily to 40 mg twice daily. -Monitor intake and output -Daily weights -Echocardiogram from prior no significant difference.  30 to 35% EF. -Continues to have cough and basal crackles. Repeat chest x-ray performed on 05/15/2018 shows improving aeration.  Acute metabolic encephalopathy -Presenting with a 1 day history of confusion and hallucinations. -CT head negative for acute finding. -Blood ethanol level negative.   -Although white count borderline elevated at 10.6, infectious etiology less likely to explain patient's acute  encephalopathy as he is afebrile. UA not suggestive of infection.  Chest x-ray without evidence of pneumonia. -No sedating medications listed on home medication list. -MRI brain negative -UDS negative -normal TSH, B12, ammonia levels -Frequent neuro checks unremarkable. -Blood culture x2 negative  -Influenza panel negative -Continue to monitor white blood cell count  Chronic anemia -Stable.  Hemoglobin 9.2, at baseline.  CKD 3 -BUN 26.  Creatinine 1.5, 1.2-1.4 during recent hospitalization. -Continue to monitor renal function with IV diuresis. -Avoid nephrotoxic agents/contrast. -Monitor urine output.  Rheumatoid arthritis -Hold home methotrexate and continue prednisone.  History of CVA Dysphagia -Continue aspirin. Speech therapy consulted, underwent modified barium swallow.  Dysphagia 2 diet.  Hyperlipidemia -Continue Lipitor.  Paroxysmal atrial fibrillation -Not on anticoagulation.  Continue Coreg.  CAD status post CABG -EKG without acute changes.  Continue Coreg, aspirin, statin.  Hypothyroidism -Continue Synthroid.  Pressure Ulcer 01/27/15 Stage I -  Intact skin with non-blanchable redness of a localized area usually over a bony prominence. redness. no open areas (Active)  01/27/15 1249  Location: Sacrum  Location Orientation: Left;Right  Staging: Stage I -  Intact skin with non-blanchable redness of a localized area usually over a bony prominence.  Wound Description (Comments): redness. no open areas  Present on Admission: Yes   Diet: cardiac diet DVT Prophylaxis: subcutaneous Heparin  Advance goals of care discussion: full code  Family Communication: no family was present at bedside, at the time of interview.   Disposition:  Discharge to home with home health when medically stable, likely tomorrow 05/16/2018.Marland Kitchen  Consultants: none Procedures: none  Scheduled Meds: . aspirin EC  81 mg Oral Daily  . clopidogrel  75 mg Oral Daily  .  enoxaparin  (LOVENOX) injection  30 mg Subcutaneous Q24H  . feeding supplement (ENSURE ENLIVE)  237 mL Oral TID BM  . furosemide  40 mg Oral Daily  . predniSONE  5 mg Oral Daily  . senna-docusate  2 tablet Oral QHS  . vitamin B-12  1,000 mcg Oral Daily   Continuous Infusions: PRN Meds: acetaminophen, acetaminophen, fluticasone, ipratropium-albuterol Antibiotics: Anti-infectives (From admission, onward)   None       Objective: Physical Exam: Vitals:   05/15/18 0847 05/15/18 0848 05/15/18 1106 05/15/18 1551  BP:  (!) 108/55 (!) 111/54 113/61  Pulse: 62 62 (!) 53 65  Resp:  20 20 20   Temp:  98.4 F (36.9 C) 97.7 F (36.5 C) 97.6 F (36.4 C)  TempSrc:  Oral Oral Oral  SpO2: 96% 95% 97% 96%  Weight:      Height:        Intake/Output Summary (Last 24 hours) at 05/15/2018 1750 Last data filed at 05/15/2018 1500 Gross per 24 hour  Intake 1195 ml  Output 1200 ml  Net -5 ml   Filed Weights   05/13/18 0529 05/14/18 0614 05/15/18 0606  Weight: 67.5 kg 62.4 kg 63.6 kg   General: Alert, Awake and Oriented to Time, Place and Person. Appear in moderate distress, affect appropriate Eyes: PERRL, Conjunctiva normal ENT: Oral Mucosa clear moist. Neck: no JVD, no Abnormal Mass Or lumps Cardiovascular: S1 and S2 Present, aortic systolic  Murmur, Peripheral Pulses Present Respiratory: normal respiratory effort, Bilateral Air entry equal and Decreased, no use of accessory muscle, bilateral  Crackles, no wheezes Abdomen: Bowel Sound present, Soft and no tenderness, no hernia Skin: no redness, no Rash, no induration Extremities: no Pedal edema, no calf tenderness Neurologic: Grossly no focal neuro deficit. Bilaterally Equal motor strength  Data Reviewed: CBC: Recent Labs  Lab 05/12/18 1603 05/12/18 1936 05/13/18 0732 05/14/18 0621  WBC 10.6*  --  8.0 8.5  NEUTROABS  --   --   --  4.5  HGB 9.2* 9.9* 8.3* 9.0*  HCT 31.2* 29.0* 28.6* 29.9*  MCV 92.9  --  91.1 90.1  PLT 303  --  260 283    Basic Metabolic Panel: Recent Labs  Lab 05/12/18 1603 05/12/18 1936 05/13/18 0732 05/14/18 0621  NA 135 136 138 138  K 4.4 4.4 3.9 3.9  CL 97*  --  99 97*  CO2 26  --  28 29  GLUCOSE 132*  --  88 82  BUN 26*  --  26* 24*  CREATININE 1.50*  --  1.46* 1.49*  CALCIUM 8.5*  --  8.4* 8.3*  MG  --   --   --  2.4    Liver Function Tests: Recent Labs  Lab 05/12/18 1603 05/14/18 0621  AST 24 20  ALT 13 11  ALKPHOS 62 52  BILITOT 0.7 1.1  PROT 6.8 6.4*  ALBUMIN 3.4* 3.0*   No results for input(s): LIPASE, AMYLASE in the last 168 hours. Recent Labs  Lab 05/12/18 2210  AMMONIA 11   Coagulation Profile: No results for input(s): INR, PROTIME in the last 168 hours. Cardiac Enzymes: No results for input(s): CKTOTAL, CKMB, CKMBINDEX, TROPONINI in the last 168 hours. BNP (last 3 results) No results for input(s): PROBNP in the last 8760 hours. CBG: Recent Labs  Lab 05/12/18 1631  GLUCAP 125*   Studies: Dg Chest 2 View  Result Date: 05/15/2018 CLINICAL DATA:  85 year old male with a history of cough and shortness of breath EXAM:  CHEST - 2 VIEW COMPARISON:  05/13/2018 FINDINGS: Cardiomediastinal silhouette unchanged in size and contour, with surgical changes of median sternotomy, CABG, and TAVR. Coarsened interstitial markings. Overall aeration is improved from the prior. No pneumothorax or new confluent airspace disease. Lateral view demonstrates no large pleural effusion. IMPRESSION: Improving aeration of the chest x-ray, with resolving pulmonary edema and pleural fluid. Surgical changes of prior median sternotomy, CABG, TAVR Electronically Signed   By: Gilmer Mor D.O.   On: 05/15/2018 13:58     Time spent: 35 minutes  Author: Lynden Oxford, MD [facility] 05/15/2018 5:50 PM  To reach On-call, see care teams to locate the attending and reach out to them via [email or web address]. If 7PM-7AM, please contact night-coverage If you still have difficulty reaching the  attending provider, please page the [facility] (Director on Call) for [facility] on amion for assistance.

## 2018-05-16 LAB — COMPREHENSIVE METABOLIC PANEL
ALT: 12 U/L (ref 0–44)
AST: 20 U/L (ref 15–41)
Albumin: 2.8 g/dL — ABNORMAL LOW (ref 3.5–5.0)
Alkaline Phosphatase: 54 U/L (ref 38–126)
Anion gap: 8 (ref 5–15)
BUN: 33 mg/dL — ABNORMAL HIGH (ref 8–23)
CHLORIDE: 97 mmol/L — AB (ref 98–111)
CO2: 33 mmol/L — ABNORMAL HIGH (ref 22–32)
Calcium: 8.5 mg/dL — ABNORMAL LOW (ref 8.9–10.3)
Creatinine, Ser: 1.25 mg/dL — ABNORMAL HIGH (ref 0.61–1.24)
GFR calc Af Amer: 59 mL/min — ABNORMAL LOW (ref >60)
GFR calc non Af Amer: 51 mL/min — ABNORMAL LOW (ref >60)
Glucose, Bld: 114 mg/dL — ABNORMAL HIGH (ref 70–99)
Potassium: 3.8 mmol/L (ref 3.5–5.1)
Sodium: 138 mmol/L (ref 135–145)
Total Bilirubin: 0.7 mg/dL (ref 0.3–1.2)
Total Protein: 6.3 g/dL — ABNORMAL LOW (ref 6.5–8.1)

## 2018-05-16 LAB — CBC WITH DIFFERENTIAL/PLATELET
Abs Immature Granulocytes: 0.18 10*3/uL — ABNORMAL HIGH (ref 0.00–0.07)
BASOS PCT: 1 %
Basophils Absolute: 0.1 10*3/uL (ref 0.0–0.1)
Eosinophils Absolute: 0.3 10*3/uL (ref 0.0–0.5)
Eosinophils Relative: 3 %
HCT: 31.4 % — ABNORMAL LOW (ref 39.0–52.0)
Hemoglobin: 9.6 g/dL — ABNORMAL LOW (ref 13.0–17.0)
Immature Granulocytes: 2 %
Lymphocytes Relative: 32 %
Lymphs Abs: 2.9 10*3/uL (ref 0.7–4.0)
MCH: 27.3 pg (ref 26.0–34.0)
MCHC: 30.6 g/dL (ref 30.0–36.0)
MCV: 89.2 fL (ref 80.0–100.0)
Monocytes Absolute: 1.3 10*3/uL — ABNORMAL HIGH (ref 0.1–1.0)
Monocytes Relative: 14 %
NRBC: 0 % (ref 0.0–0.2)
Neutro Abs: 4.2 10*3/uL (ref 1.7–7.7)
Neutrophils Relative %: 48 %
PLATELETS: 253 10*3/uL (ref 150–400)
RBC: 3.52 MIL/uL — AB (ref 4.22–5.81)
RDW: 18.8 % — ABNORMAL HIGH (ref 11.5–15.5)
WBC: 8.8 10*3/uL (ref 4.0–10.5)

## 2018-05-16 LAB — MAGNESIUM: Magnesium: 2.3 mg/dL (ref 1.7–2.4)

## 2018-05-16 MED ORDER — LEVOTHYROXINE SODIUM 50 MCG PO TABS
50.0000 ug | ORAL_TABLET | Freq: Every day | ORAL | Status: DC
  Administered 2018-05-16: 50 ug via ORAL
  Filled 2018-05-16: qty 1

## 2018-05-16 MED ORDER — ENOXAPARIN SODIUM 40 MG/0.4ML ~~LOC~~ SOLN
40.0000 mg | SUBCUTANEOUS | Status: DC
  Administered 2018-05-16: 40 mg via SUBCUTANEOUS
  Filled 2018-05-16: qty 0.4

## 2018-05-16 MED ORDER — ATORVASTATIN CALCIUM 10 MG PO TABS
20.0000 mg | ORAL_TABLET | Freq: Every day | ORAL | Status: DC
  Administered 2018-05-16 – 2018-05-17 (×2): 20 mg via ORAL
  Filled 2018-05-16 (×2): qty 2

## 2018-05-16 MED ORDER — LEVOTHYROXINE SODIUM 100 MCG PO TABS
100.0000 ug | ORAL_TABLET | Freq: Every day | ORAL | Status: DC
  Administered 2018-05-17: 100 ug via ORAL
  Filled 2018-05-16: qty 1

## 2018-05-16 MED ORDER — CARVEDILOL 6.25 MG PO TABS
6.2500 mg | ORAL_TABLET | Freq: Two times a day (BID) | ORAL | Status: DC
  Administered 2018-05-16 – 2018-05-17 (×2): 6.25 mg via ORAL
  Filled 2018-05-16 (×2): qty 1

## 2018-05-16 NOTE — Progress Notes (Signed)
PROGRESS NOTE    Hunter Choi  MOL:[id number] DOB: 1927/04/25 DOA: 05/12/2018 PCP: [name], MD   Brief Narrative:  85 year old with past medical history relevant for aortic stenosis status post TAVR, coronary artery disease status post CABG with no lesions amicable to intervention by cath in 2018, chronic systolic ischemic congestive heart failure, rheumatoid arthritis, hypertension, hyperlipidemia, hypothyroidism, history of CVA, BPH, paroxysmal atrial fibrillation admitted on 05/12/2018 with acute on chronic systolic heart failure exacerbation.  He is also developed acute metabolic encephalopathy of unclear etiology.  Assessment & Plan:   Principal Problem:   Acute exacerbation of CHF (congestive heart failure) (HCC) Active Problems:   Rheumatoid arthritis (HCC)   Thyroid disease   Coronary artery disease involving coronary bypass graft of native heart with angina pectoris (HCC)   Acute metabolic encephalopathy   #) Acute on chronic ischemic systolic heart failure: Patient appears to be somewhat volume up but improving. -Continue p.o. furosemide 40 mg twice daily -Strict ins and outs, weigh daily, sodium restricted diet - Continue beta-blocker -Continue to hold losartan 25 mg daily  #) Acute metabolic encephalopathy: It is unclear what the patient's baseline is.  He seems to be still somewhat confused. -We will discuss with family -Hold sedating medications - Pending discharge to skilled nursing facility  #) Hypertension/hyperlipidemia/coronary artery disease status post CABG: Patient no longer has any reported targets for intervention -Continue aspirin 81 mg -Continue clopidogrel 75 mg daily -Continue carvedilol 6.25 mg twice daily - Hold losartan 25 mg daily -Continue atorvastatin 20 mg daily -Hold amlodipine 5 mg daily  #) Hypothyroidism: TSH minimally elevated -Continue levothyroxine 100 mcg daily, this dose was recently increased as an outpatient\  #)  Rheumatoid arthritis: -Continue prednisone 5 mg daily -Hold methotrexate  #) History of CVA/status post TAVR/history of paroxysmal atrial fibrillation: Patient is not on any anticoagulation at this time  #) Stage III CKD: This is stable  Fluids: Restrict Electrolytes: Monitor and supplement Nutrition: Heart healthy diet  Prophylaxis: Enoxaparin  Disposition: Pending placement  Full code  Consultants:   None  Procedures: Echo 05/13/2018: 1. The left ventricle has moderate-severely reduced systolic function, with an ejection fraction of 30-35%. The cavity size was severely dilated. Left ventricular diastology could not be evaluated due to nondiagnostic images. Left ventricular diffuse  hypokinesis.  2. The right ventricle has normal systolic function. The cavity was normal. There is no increase in right ventricular wall thickness.  3. Left atrial size was severely dilated.  4. Right atrial size was mildly dilated.  5. The mitral valve is degenerative. Mild thickening of the mitral valve leaflet. There is moderate mitral annular calcification present. Mitral valve regurgitation is moderate by color flow Doppler. No evidence of mitral valve stenosis.  6. The tricuspid valve is normal in structure.  7. A valve is present in the aortic position. Normal aortic valve prosthesis.  8. S/P TAVR with normal mean gradient of 6.20mmHg, AV peak velocty 170cm/s and AVA 2.7cm2.  9. The pulmonic valve was normal in structure. Pulmonic valve regurgitation is mild by color flow Doppler.  10. Right atrial pressure is estimated at 8 mmHg.  Antimicrobials:   None   Subjective: This morning the patient does not have any complaints.  He denies any nausea, vomiting, diarrhea, cough, congestion, rhinorrhea.  Objective: Vitals:   05/15/18 1551 05/15/18 2000 05/16/18 0100 05/16/18 0426  BP: 113/61 (!) 118/54  (!) 125/55  Pulse: 65 66  66  Resp: 20 18  18   Temp:  97.6 F (36.4 C)   98.3 F (36.8  C)  TempSrc: Oral   Oral  SpO2: 96% 95%  98%  Weight:   61.1 kg   Height:        Intake/Output Summary (Last 24 hours) at 05/16/2018 1014 Last data filed at 05/16/2018 [id number] Gross per 24 hour  Intake 1075 ml  Output 1125 ml  Net -50 ml   Filed Weights   05/14/18 0614 05/15/18 0606 05/16/18 0100  Weight: 62.4 kg 63.6 kg 61.1 kg    Examination:  General exam: Appears calm and comfortable  Respiratory system: No increased work of breathing, diminished lung sounds at bases, no wheezes, crackles, rhonchi Cardiovascular system: Regular rate and rhythm, no murmurs Gastrointestinal system: Soft, nontender, no rebound or guarding, plus bowel sounds. Central nervous system: Alert but oriented only to person, place but not to time or situation, grossly intact, moving all extremities Extremities: Trace lower extremity edema, ulnar deviation of fingers, boutonnieres deformity noted Skin: No rashes over visible skin Psychiatry: Unable to assess due to medical condition    Data Reviewed: I have personally reviewed following labs and imaging studies  CBC: Recent Labs  Lab 05/12/18 1603 05/12/18 1936 05/13/18 0732 05/14/18 0621 05/16/18 0323  WBC 10.6*  --  8.0 8.5 8.8  NEUTROABS  --   --   --  4.5 4.2  HGB 9.2* 9.9* 8.3* 9.0* 9.6*  HCT 31.2* 29.0* 28.6* 29.9* 31.4*  MCV 92.9  --  91.1 90.1 89.2  PLT 303  --  260 283 253   Basic Metabolic Panel: Recent Labs  Lab 05/12/18 1603 05/12/18 1936 05/13/18 0732 05/14/18 0621 05/16/18 0323  NA 135 136 138 138 138  K 4.4 4.4 3.9 3.9 3.8  CL 97*  --  99 97* 97*  CO2 26  --  28 29 33*  GLUCOSE 132*  --  88 82 114*  BUN 26*  --  26* 24* 33*  CREATININE 1.50*  --  1.46* 1.49* 1.25*  CALCIUM 8.5*  --  8.4* 8.3* 8.5*  MG  --   --   --  2.4 2.3   GFR: Estimated Creatinine Clearance: 35.3 mL/min (A) (by C-G formula based on SCr of 1.25 mg/dL (H)). Liver Function Tests: Recent Labs  Lab 05/12/18 1603 05/14/18 0621 05/16/18 0323    AST 24 20 20   ALT 13 11 12   ALKPHOS 62 52 54  BILITOT 0.7 1.1 0.7  PROT 6.8 6.4* 6.3*  ALBUMIN 3.4* 3.0* 2.8*   No results for input(s): LIPASE, AMYLASE in the last 168 hours. Recent Labs  Lab 05/12/18 2210  AMMONIA 11   Coagulation Profile: No results for input(s): INR, PROTIME in the last 168 hours. Cardiac Enzymes: No results for input(s): CKTOTAL, CKMB, CKMBINDEX, TROPONINI in the last 168 hours. BNP (last 3 results) No results for input(s): PROBNP in the last 8760 hours. HbA1C: No results for input(s): HGBA1C in the last 72 hours. CBG: Recent Labs  Lab 05/12/18 1631  GLUCAP 125*   Lipid Profile: No results for input(s): CHOL, HDL, LDLCALC, TRIG, CHOLHDL, LDLDIRECT in the last 72 hours. Thyroid Function Tests: No results for input(s): TSH, T4TOTAL, FREET4, T3FREE, THYROIDAB in the last 72 hours. Anemia Panel: No results for input(s): VITAMINB12, FOLATE, FERRITIN, TIBC, IRON, RETICCTPCT in the last 72 hours. Sepsis Labs: No results for input(s): PROCALCITON, LATICACIDVEN in the last 168 hours.  Recent Results (from the past 240 hour(s))  Culture, blood (routine x 2)  Status: None (Preliminary result)   Collection Time: 05/12/18  4:35 PM  Result Value Ref Range Status   Specimen Description BLOOD RIGHT FOREARM  Final   Special Requests   Final    BOTTLES DRAWN AEROBIC AND ANAEROBIC Blood Culture results may not be optimal due to an excessive volume of blood received in culture bottles   Culture   Final    NO GROWTH 3 DAYS Performed at [facility], [address] [place], [place] [address]    Report Status PENDING  Incomplete  Culture, blood (routine x 2)     Status: None (Preliminary result)   Collection Time: 05/12/18  7:11 PM  Result Value Ref Range Status   Specimen Description BLOOD RIGHT ANTECUBITAL  Final   Special Requests   Final    BOTTLES DRAWN AEROBIC ONLY Blood Culture results may not be optimal due to an inadequate volume of blood  received in culture bottles   Culture   Final    NO GROWTH 3 DAYS Performed at [facility], [address] [place], [place] [address]    Report Status PENDING  Incomplete         Radiology Studies: Dg Chest 2 View  Result Date: 05/15/2018 CLINICAL DATA:  85 year old male with a history of cough and shortness of breath EXAM: CHEST - 2 VIEW COMPARISON:  05/13/2018 FINDINGS: Cardiomediastinal silhouette unchanged in size and contour, with surgical changes of median sternotomy, CABG, and TAVR. Coarsened interstitial markings. Overall aeration is improved from the prior. No pneumothorax or new confluent airspace disease. Lateral view demonstrates no large pleural effusion. IMPRESSION: Improving aeration of the chest x-ray, with resolving pulmonary edema and pleural fluid. Surgical changes of prior median sternotomy, CABG, TAVR Electronically Signed   By: Gilmer Mor D.O.   On: 05/15/2018 13:58   Dg Swallowing Func-speech Pathology  Result Date: 05/14/2018 Objective Swallowing Evaluation: Type of Study: MBS-Modified Barium Swallow Study  Patient Details Name: Hunter Choi MRN: [id number] Date of Birth: 1927/05/10 Today's Date: 05/14/2018 Time: SLP Start Time (ACUTE ONLY): 1005 -SLP Stop Time (ACUTE ONLY): 1020 SLP Time Calculation (min) (ACUTE ONLY): 15 min Past Medical History: Past Medical History: Diagnosis Date . AAA (abdominal aortic aneurysm) (HCC)   Dr. Ashley Royalty . Aortic stenosis, severe   a. s/p TAVR . Blind left eye  . BPH (benign prostatic hypertrophy)   Dr. Retta Diones . CAD (coronary artery disease) of bypass graft   Chronic occlusion of saphenous vein graft to RCA . Coronary artery disease   Left main disease and severe 3-vessel CAD . Diastolic heart failure (HCC)  . Embolism involving retinal artery  . GERD (gastroesophageal reflux disease)  . Hard of hearing  . Hypertension  . Hypothyroidism  . Iliac artery aneurysm (HCC)   Dr. Edilia Bo . PAF (paroxysmal atrial fibrillation) (HCC)   . S/P TAVR (transcatheter aortic valve replacement) 01/15/2013  a. 12/2012: mm Stephannie Peters XT transcatheter heart valve placed via transapical approach . Stroke [facility])   mini stroke effective his eye left Past Surgical History: Past Surgical History: Procedure Laterality Date . BIOPSY PROSTATE  2005 . BUNIONECTOMY WITH HAMMERTOE RECONSTRUCTION Bilateral  . CARDIAC CATHETERIZATION   . CARDIAC CATHETERIZATION N/A 05/07/2015  Procedure: Right Heart Cath and Coronary/Graft Angiography;  Surgeon: Lyn Records, MD;  Location: [facility] INVASIVE CV LAB;  Service: Cardiovascular;  Laterality: N/A; . CAROTID ENDARTERECTOMY Right Jan. 2, 1997 . CATARACT EXTRACTION W/ INTRAOCULAR LENS IMPLANT Right  . CHOLECYSTECTOMY N/A  01/28/2015  Procedure: LAPAROSCOPIC CHOLECYSTECTOMY WITH INTRAOPERATIVE CHOLANGIOGRAM;  Surgeon: Gaynelle Adu, MD;  Location: [facility] OR;  Service: General;  Laterality: N/A; . COLONOSCOPY   . CORONARY ARTERY BYPASS GRAFT  01/1998  Dr. Sheliah Plane; LIMA to LAD, SVG to D1, SVG to LCx, SVG to RCA, open saphenous vein harvest via right lower extremity . HARVEST BONE GRAFT  1982  FOR THE  ANKLE . HEMIARTHROPLASTY HIP Left   AFTER HIP FX . INTRAOPERATIVE TRANSESOPHAGEAL ECHOCARDIOGRAM N/A 01/15/2013  Procedure: INTRAOPERATIVE TRANSESOPHAGEAL ECHOCARDIOGRAM;  Surgeon: [name], MD;  Location: [facility] OR;  Service: Open Heart Surgery;  Laterality: N/A; . JOINT REPLACEMENT   . LEFT AND RIGHT HEART CATHETERIZATION WITH CORONARY ANGIOGRAM N/A 12/20/2012  Procedure: LEFT AND RIGHT HEART CATHETERIZATION WITH CORONARY ANGIOGRAM;  Surgeon: Lesleigh Noe, MD;  Location: [facility] CATH LAB;  Service: Cardiovascular;  Laterality: N/A; . LEFT HEART CATH AND CORS/GRAFTS ANGIOGRAPHY N/A 02/14/2017  Procedure: LEFT HEART CATH AND CORS/GRAFTS ANGIOGRAPHY;  Surgeon: Marykay Lex, MD;  Location: [facility] INVASIVE CV LAB;  Service: Cardiovascular;  Laterality: N/A; . SKIN GRAFTS  1968 . TOTAL HIP ARTHROPLASTY Left 2007 . TOTAL KNEE ARTHROPLASTY   1992-2002  right-left . TRANSCATHETER AORTIC VALVE REPLACEMENT, TRANSAPICAL N/A 01/15/2013  Procedure: TRANSCATHETER AORTIC VALVE REPLACEMENT, TRANSAPICAL;  Surgeon: [name], MD;  Location: [facility] OR;  Service: Open Heart Surgery;  Laterality: N/A; . TRANSURETHRAL RESECTION OF PROSTATE  2012 HPI: Hunter Choi is a 85 y.o. male with medical history significant of rheumatoid arthritis on methotrexate and prednisone, CVA, GERD, AAA, BPH, hypothyroidism, CAD, chronic systolic congestive heart failure with EF 30-35%, paroxysmal atrial fibrillation, status post TAVR presenting to the hospital for evaluation of altered mental status, generalized weakness, and labored breathing.  Patient is confused and mumbling words.  No history could be obtained from him.  When asked what his name was he replied " you are welcome" and " thank you."  No family available at this time.  MRI of the head is showing no acute abnomality, however, mutliple remote lacunar infarcts in the bilateral basal ganglia, thalami and pons as well as multiple chronic bilateral cerebellar infarcts.  Chest xray is showing mild central pulmonary vascular congestion, suspected chronic mild CHF, probable small bilateral pleural effusions and chronic opacities in the bilateral lung bases, suspected chronic atelectasis or scarring/fibrosis.   Subjective: Pt upright in chair. He was pleasant and cooperative during eval Assessment / Plan / Recommendation CHL IP CLINICAL IMPRESSIONS 05/14/2018 Clinical Impression Pt presents with mild pharyngoesophageal dysphagia c/b  timely swallow initiaiton, inconsistent clearance of penetrate, esophageal backflow into pharynx and pill hung in cervical esophagus. Small sips of thin via teaspoon and cup yielded no s/sx of aspiration. When freely drinking with straw and during pill intake, pt penetrated thin above cords and inconsistenly ejected it via strength of laryngeal closure, independent throat clear x2 and min verbal  cues to cough. Only trace penetrate noted when pt unable to fully clear. Mild- mod residue observed with puree and regular textured solid but was cleared with successive indpendent swallows and cues for liquid wash. Pill with thin followed by puree noted to be hung in cervical esophagus. Given improvment  SLP Visit Diagnosis Dysphagia, pharyngoesophageal phase (R13.14) Attention and concentration deficit following -- Frontal lobe and executive function deficit following -- Impact on safety and function Moderate aspiration risk   CHL IP TREATMENT RECOMMENDATION 05/14/2018 Treatment Recommendations Therapy as outlined in treatment plan below   Prognosis 05/14/2018 Prognosis for  Safe Diet Advancement Good Barriers to Reach Goals -- Barriers/Prognosis Comment -- CHL IP DIET RECOMMENDATION 05/14/2018 SLP Diet Recommendations Dysphagia 2 (Fine chop) solids;Thin liquid Liquid Administration via Cup;No straw Medication Administration Crushed with puree Compensations Small sips/bites Postural Changes Seated upright at 90 degrees;Remain semi-upright after after feeds/meals (Comment)   CHL IP OTHER RECOMMENDATIONS 05/14/2018 Recommended Consults Consider esophageal assessment Oral Care Recommendations Oral care BID Other Recommendations --   CHL IP FOLLOW UP RECOMMENDATIONS 05/14/2018 Follow up Recommendations Other (comment)   CHL IP FREQUENCY AND DURATION 05/14/2018 Speech Therapy Frequency (ACUTE ONLY) min 2x/week Treatment Duration 2 weeks      CHL IP ORAL PHASE 05/14/2018 Oral Phase WFL Oral - Pudding Teaspoon -- Oral - Pudding Cup -- Oral - Honey Teaspoon -- Oral - Honey Cup -- Oral - Nectar Teaspoon -- Oral - Nectar Cup -- Oral - Nectar Straw -- Oral - Thin Teaspoon -- Oral - Thin Cup -- Oral - Thin Straw -- Oral - Puree -- Oral - Mech Soft -- Oral - Regular -- Oral - Multi-Consistency -- Oral - Pill -- Oral Phase - Comment --  CHL IP PHARYNGEAL PHASE 05/14/2018 Pharyngeal Phase Impaired Pharyngeal- Pudding Teaspoon --  Pharyngeal -- Pharyngeal- Pudding Cup -- Pharyngeal -- Pharyngeal- Honey Teaspoon -- Pharyngeal -- Pharyngeal- Honey Cup -- Pharyngeal -- Pharyngeal- Nectar Teaspoon -- Pharyngeal -- Pharyngeal- Nectar Cup -- Pharyngeal -- Pharyngeal- Nectar Straw -- Pharyngeal -- Pharyngeal- Thin Teaspoon WFL Pharyngeal -- Pharyngeal- Thin Cup WFL;Penetration/Aspiration during swallow Pharyngeal Material enters airway, remains ABOVE vocal cords then ejected out;Material enters airway, remains ABOVE vocal cords and not ejected out Pharyngeal- Thin Straw Penetration/Aspiration during swallow;Compensatory strategies attempted (with notebox) Pharyngeal Material enters airway, remains ABOVE vocal cords then ejected out;Material enters airway, remains ABOVE vocal cords and not ejected out Pharyngeal- Puree WFL Pharyngeal -- Pharyngeal- Mechanical Soft -- Pharyngeal -- Pharyngeal- Regular WFL;Pharyngeal residue - valleculae Pharyngeal -- Pharyngeal- Multi-consistency -- Pharyngeal -- Pharyngeal- Pill WFL Pharyngeal -- Pharyngeal Comment --  CHL IP CERVICAL ESOPHAGEAL PHASE 05/14/2018 Cervical Esophageal Phase Impaired Pudding Teaspoon -- Pudding Cup -- Honey Teaspoon -- Honey Cup -- Nectar Teaspoon -- Nectar Cup -- Nectar Straw -- Thin Teaspoon -- Thin Cup -- Thin Straw Esophageal backflow into the pharynx;Reduced cricopharyngeal relaxation Puree -- Mechanical Soft -- Regular -- Multi-consistency -- Pill Esophageal backflow into the pharynx;Reduced cricopharyngeal relaxation Cervical Esophageal Comment (No Data) Skip MayerLaura P Nix 05/14/2018, 2:08 PM  Note populated on behalf of Gardiner RamusIzzy Arndt, Student SLP Natalia LeatherwoodLaura Paiewonsky Nix, M.A. CCC-SLP Acute Rehabilitation Services Pager [phone] Office [phone]                  Scheduled Meds: . aspirin EC  81 mg Oral Daily  . clopidogrel  75 mg Oral Daily  . enoxaparin (LOVENOX) injection  30 mg Subcutaneous Q24H  . feeding supplement (ENSURE ENLIVE)  237 mL Oral TID BM  . furosemide  40  mg Oral Daily  . levothyroxine  50 mcg Oral Q0600  . predniSONE  5 mg Oral Daily  . senna-docusate  2 tablet Oral QHS  . vitamin B-12  1,000 mcg Oral Daily   Continuous Infusions:   LOS: 4 days    Time spent: 35    Delaine LameShrey C Haadi Santellan, MD [facility]  If 7PM-7AM, please contact night-coverage [email or web address] Password [email or web address] 05/16/2018, 10:14 AM

## 2018-05-16 NOTE — Plan of Care (Signed)
  Problem: Nutrition: Goal: Adequate nutrition will be maintained Outcome: Progressing   Problem: Elimination: Goal: Will not experience complications related to bowel motility Outcome: Progressing   Problem: Safety: Goal: Ability to remain free from injury will improve Outcome: Progressing   

## 2018-05-16 NOTE — Progress Notes (Signed)
Patient son had called earlier regarding possible discharge of patient this date.  Son just notified that it is unlikely that patient will d/c today as MD feels he would benefit from one more day in hospital.

## 2018-05-17 LAB — CULTURE, BLOOD (ROUTINE X 2)
Culture: NO GROWTH
Culture: NO GROWTH

## 2018-05-17 NOTE — Discharge Summary (Signed)
Physician Discharge Summary  Hunter Choi HLK:[id number] DOB: Jul 18, 1927 DOA: 05/12/2018  PCP: [name], MD  Admit date: 05/12/2018 Discharge date: 05/17/2018  Admitted From: Home Disposition: Home  Recommendations for Outpatient Follow-up:  1. Follow up with PCP in 1-2 weeks 2. Please obtain BMP/CBC in one week 3. Please recheck TSH in 6 weeks   Home Health: Yes, home health PT and RN Equipment/Devices: 3 and 1  Discharge Condition: Stable CODE STATUS: Full Diet recommendation: Heart Healthy / Carb Modified  Brief/Interim Summary:  #) Acute on chronic ischemic systolic heart failure: Patient was admitted with acute heart failure exacerbation.  Echo showed EF was stable at around 30 to 35%.  Patient initially was given IV furosemide and then transition to oral furosemide.  Patient was also continued on home beta-blocker.  #) Acute metabolic encephalopathy: Initially it was thought that patient was confused but this apparently appeared to be the patient's baseline.  #) Hypertension/hyperlipidemia/coronary artery disease status post CABG: Per review of prior cardiology notes patient is not considered a candidate for any invasive intervention.  Patient was continued on aspirin, clopidogrel, carvedilol, atorvastatin.  Amlodipine and losartan were held but may be restarted on discharge.  #) Hypothyroidism: TSH was minimally elevated.  His levothyroxine dose had recently been increased as an outpatient.  His TSH will need to be checked in approximately 6 weeks.  #) History of CVA/status post TAVR/history of paroxysmal atrial fibrillation: Patient is not on any anticoagulation besides clopidogrel.  It is unclear that additional anticoagulation would benefit the patient.  #) Stage III CKD: This was stable.  Discharge Diagnoses:  Principal Problem:   Acute exacerbation of CHF (congestive heart failure) (HCC) Active Problems:   Rheumatoid arthritis (HCC)   Thyroid  disease   Coronary artery disease involving coronary bypass graft of native heart with angina pectoris (HCC)   Acute metabolic encephalopathy    Discharge Instructions  Discharge Instructions    Call MD for:  difficulty breathing, headache or visual disturbances   Complete by:  As directed    Call MD for:  hives   Complete by:  As directed    Call MD for:  persistant dizziness or light-headedness   Complete by:  As directed    Call MD for:  persistant nausea and vomiting   Complete by:  As directed    Call MD for:  redness, tenderness, or signs of infection (pain, swelling, redness, odor or green/yellow discharge around incision site)   Complete by:  As directed    Call MD for:  severe uncontrolled pain   Complete by:  As directed    Call MD for:  temperature >100.4   Complete by:  As directed    Diet - low sodium heart healthy   Complete by:  As directed    Discharge instructions   Complete by:  As directed    Please follow-up with your primary care doctor in 1 week.  Please double up on the dose of your water pills if you notice any swelling in your legs, weight gain of more than 5 pounds, or short of breath lying down flat.   Increase activity slowly   Complete by:  As directed      Allergies as of 05/17/2018      Reactions   Ciprofloxacin Other (See Comments)   REACTION: mild delirium      Medication List    TAKE these medications   acetaminophen 500 MG tablet Commonly known as:  TYLENOL  Take 1,000 mg by mouth 2 (two) times daily.   amLODipine 5 MG tablet Commonly known as:  NORVASC Take 5 mg by mouth daily.   aspirin 81 MG EC tablet Take 1 tablet (81 mg total) by mouth daily.   atorvastatin 20 MG tablet Commonly known as:  LIPITOR Take 20 mg by mouth daily.   carvedilol 6.25 MG tablet Commonly known as:  COREG Take 1 tablet (6.25 mg total) by mouth 2 (two) times daily with a meal.   clopidogrel 75 MG tablet Commonly known as:  PLAVIX Take 1 tablet (75  mg total) by mouth daily.   famotidine 20 MG tablet Commonly known as:  PEPCID Take 20 mg by mouth 2 (two) times daily as needed for heartburn or indigestion.   feeding supplement (ENSURE ENLIVE) Liqd Take 237 mLs by mouth 3 (three) times daily between meals.   feeding supplement (PRO-STAT SUGAR FREE 64) Liqd Take 30 mLs by mouth 2 (two) times daily.   fluticasone 50 MCG/ACT nasal spray Commonly known as:  FLONASE Place 1 spray into both nostrils at bedtime as needed for allergies or rhinitis.   folic acid 1 MG tablet Commonly known as:  FOLVITE Take 3 mg by mouth daily.   furosemide 20 MG tablet Commonly known as:  LASIX TAKE 2 TABLETS BY MOUTH ONE TIME A DAY What changed:  See the new instructions.   levothyroxine 88 MCG tablet Commonly known as:  SYNTHROID, LEVOTHROID Take 1 tablet (88 mcg total) by mouth daily.   levothyroxine 100 MCG tablet Commonly known as:  SYNTHROID, LEVOTHROID Take 100 mcg by mouth daily before breakfast.   losartan 25 MG tablet Commonly known as:  COZAAR Take 25 mg by mouth daily.   methotrexate 2.5 MG tablet Take 25 mg by mouth every Friday.   multivitamin with minerals Tabs tablet Take 1 tablet by mouth daily.   nitroGLYCERIN 0.4 MG SL tablet Commonly known as:  NITROSTAT PLACE 1 TABLET (0.4 MG TOTAL) UNDER THE TONGUE EVERY 5 MINUTES AS NEEDED FOR CHEST PAIN What changed:  See the new instructions.   pantoprazole 40 MG tablet Commonly known as:  PROTONIX Take 1 tablet (40 mg total) by mouth daily.   predniSONE 5 MG tablet Commonly known as:  DELTASONE Take 5 mg by mouth daily.   TUMS PO Take 1 tablet by mouth 2 (two) times daily as needed (heartburn/indigestion).   vitamin B-12 1000 MCG tablet Commonly known as:  CYANOCOBALAMIN Take 1 tablet (1,000 mcg total) by mouth daily.      Follow-up Information    [facility] Follow up.   Specialty:  Home Health Services Why:  They will do your home health care at your  home Contact information: [address] [place] [address] [place] [address] [phone]          Allergies  Allergen Reactions  . Ciprofloxacin Other (See Comments)    REACTION: mild delirium    Consultations:  None   Procedures/Studies: Dg Chest 2 View  Result Date: 05/15/2018 CLINICAL DATA:  85 year old male with a history of cough and shortness of breath EXAM: CHEST - 2 VIEW COMPARISON:  05/13/2018 FINDINGS: Cardiomediastinal silhouette unchanged in size and contour, with surgical changes of median sternotomy, CABG, and TAVR. Coarsened interstitial markings. Overall aeration is improved from the prior. No pneumothorax or new confluent airspace disease. Lateral view demonstrates no large pleural effusion. IMPRESSION: Improving aeration of the chest x-ray, with resolving pulmonary edema and pleural fluid. Surgical  changes of prior median sternotomy, CABG, TAVR Electronically Signed   By: Gilmer Mor D.O.   On: 05/15/2018 13:58   Dg Chest 2 View  Result Date: 05/12/2018 CLINICAL DATA:  Shortness of breath, altered status. EXAM: CHEST - 2 VIEW COMPARISON:  Chest x-rays dated 04/27/2018 and 12/09/2017. FINDINGS: Stable cardiomegaly. Median sternotomy wires appear intact and stable in alignment. Valvular hardware appears stable in position. Chronic opacities at the bilateral lung bases, presumably chronic atelectasis or scarring/fibrosis. Mild central pulmonary vascular congestion. Probable small bilateral pleural effusions. No pneumothorax seen. No acute or suspicious osseous finding. IMPRESSION: 1. Stable cardiomegaly. Mild central pulmonary vascular congestion, similar to previous studies, suspected chronic mild CHF. 2. Probable small bilateral pleural effusions. 3. Chronic opacities at the bilateral lung bases, suspected chronic atelectasis or scarring/fibrosis. Electronically Signed   By: Bary Richard M.D.   On: 05/12/2018 19:01   Dg Chest 2 View  Result Date: 04/24/2018 CLINICAL  DATA:  Shortness of breath, cough. EXAM: CHEST - 2 VIEW COMPARISON:  Radiographs of December 09, 2017. FINDINGS: Stable cardiomegaly with central pulmonary vascular congestion. Atherosclerosis of thoracic aorta is noted. Status post coronary artery bypass graft and aortic valve repair. No pneumothorax is noted. No significant pleural effusion is noted. Some degree of pulmonary edema can not be excluded. Bony thorax is unremarkable. IMPRESSION: Stable cardiomegaly with central pulmonary vascular congestion and possible pulmonary edema. Aortic Atherosclerosis (ICD10-I70.0). Electronically Signed   By: Lupita Raider, M.D.   On: 04/24/2018 11:18   Ct Head Wo Contrast  Result Date: 05/12/2018 CLINICAL DATA:  Altered mental status.  Weakness.  Slurred speech. EXAM: CT HEAD WITHOUT CONTRAST TECHNIQUE: Contiguous axial images were obtained from the base of the skull through the vertex without intravenous contrast. COMPARISON:  Aug 08, 2017 FINDINGS: Brain: No subdural, epidural, or subarachnoid hemorrhage. Lacunar infarcts in the cerebellum are stable. Basal cisterns and brainstem are normal. Ventricles and sulci are unchanged. Severe white matter changes are stable. No acute cortical ischemia or infarct. No mass effect or midline shift identified. Vascular: No hyperdense vessel or unexpected calcification. Skull: Normal. Negative for fracture or focal lesion. Sinuses/Orbits: Opacification of left greater than right mastoid air cells is stable. Middle ears are well aerated. Paranasal sinuses are well aerated. Other: None. IMPRESSION: 1. Chronic white matter changes with no acute intracranial abnormalities. 2. No other acute abnormalities. Electronically Signed   By: Gerome Sam III M.D   On: 05/12/2018 17:58   Mr Brain Wo Contrast  Result Date: 05/13/2018 CLINICAL DATA:  Initial evaluation for acute altered mental status, slurred speech. EXAM: MRI HEAD WITHOUT CONTRAST TECHNIQUE: Multiplanar, multiecho pulse  sequences of the brain and surrounding structures were obtained without intravenous contrast. COMPARISON:  Comparison made with prior head CT from 05/12/2018 FINDINGS: Brain: Advanced age-related cerebral atrophy. Extensive patchy and confluent T2/FLAIR hyperintensity throughout the periventricular and deep white matter both cerebral hemispheres, most consistent with severe chronic microvascular ischemic disease. Probable superimposed small remote right parietal cortical infarct. Multiple remote lacunar infarcts seen involving the bilateral basal ganglia and thalami, with multiple additional remote bilateral cerebellar infarcts. No abnormal foci of restricted diffusion to suggest acute or subacute ischemia. Gray-white matter differentiation maintained. No evidence for acute intracranial hemorrhage. Prominent chronic microhemorrhage noted at the pons, likely small vessel related. No mass lesion, midline shift or mass effect. Diffuse ventricular prominence related to global parenchymal atrophy without hydrocephalus. No extra-axial fluid collection. Pituitary gland suprasellar region normal. Vascular: Abnormal flow void within the left  ICA to the level of the terminus, likely occluded. Major intracranial vascular flow voids otherwise maintained. Skull and upper cervical spine: Craniocervical junction normal. Scattered degenerative spondylolysis noted within the upper cervical spine without significant stenosis. Bone marrow signal intensity within normal limits. No scalp soft tissue abnormality. Sinuses/Orbits: Patient status post ocular lens replacement on the right. Globes and orbital soft tissues demonstrate no acute finding. Scattered mucosal thickening within the ethmoidal air cells, likely chronic. Bilateral mastoid effusions noted. Inner ear structures grossly normal. Other: None. IMPRESSION: 1. No acute intracranial abnormality. 2. Advanced age-related cerebral atrophy with severe chronic microvascular ischemic  disease, with multiple remote lacunar infarcts involving the bilateral basal ganglia, thalami, and pons, with multiple chronic bilateral cerebellar infarcts. 3. Abnormal flow void within the left ICA to the level of the terminus, likely occluded. Electronically Signed   By: Rise Mu M.D.   On: 05/13/2018 02:26   Dg Chest Port 1 View  Result Date: 05/13/2018 CLINICAL DATA:  Dyspnea EXAM: PORTABLE CHEST 1 VIEW COMPARISON:  05/12/2018 chest radiograph. FINDINGS: Intact sternotomy wires. Aortic valve prosthesis is in place. CABG clips overlie the mediastinum. Stable cardiomediastinal silhouette with mild to moderate cardiomegaly. No pneumothorax. Small bilateral pleural effusions, right greater the left, stable. Worsening linear and fluffy parahilar opacities throughout both lungs. IMPRESSION: Worsening linear and fluffy parahilar opacities throughout both lungs with cardiomegaly and small bilateral pleural effusions, most compatible with worsening moderate to severe congestive heart failure. Electronically Signed   By: Delbert Phenix M.D.   On: 05/13/2018 16:10   Dg Chest Port 1 View  Result Date: 04/27/2018 CLINICAL DATA:  Short of breath EXAM: PORTABLE CHEST 1 VIEW COMPARISON:  04/24/2018 FINDINGS: Cardiac enlargement. CABG and aortic valve replacement with TAVR. Atherosclerotic aorta Congestive heart failure with vascular congestion and mild edema. Progression of bibasilar atelectasis and small effusions since the prior study. IMPRESSION: Congestive heart failure with mild progression from the prior study. Electronically Signed   By: Marlan Palau M.D.   On: 04/27/2018 11:25   Dg Abd Portable 1v  Result Date: 04/23/2018 CLINICAL DATA:  Vomiting. EXAM: PORTABLE ABDOMEN - 1 VIEW COMPARISON:  CT chest, abdomen and pelvis 08/03/2017. FINDINGS: The stomach is distended. Small and large bowel loops are unremarkable. Large rounded calcifications in the pelvis are consistent with distal abdominal and  bilateral iliac artery aneurysms as seen on the prior CT. IMPRESSION: Distended stomach. Negative for bowel obstruction. Calcified abdominal aortic and bilateral iliac artery aneurysms. Electronically Signed   By: Drusilla Kanner M.D.   On: 04/23/2018 10:36   Dg Swallowing Func-speech Pathology  Result Date: 05/14/2018 Objective Swallowing Evaluation: Type of Study: MBS-Modified Barium Swallow Study  Patient Details Name: BRYSE BLANCHETTE MRN: [id number] Date of Birth: [birth year] Today's Date: 05/14/2018 Time: SLP Start Time (ACUTE ONLY): 1005 -SLP Stop Time (ACUTE ONLY): 1020 SLP Time Calculation (min) (ACUTE ONLY): 15 min Past Medical History: Past Medical History: Diagnosis Date . AAA (abdominal aortic aneurysm) (HCC)   Dr. Ashley Royalty . Aortic stenosis, severe   a. s/p TAVR . Blind left eye  . BPH (benign prostatic hypertrophy)   Dr. Retta Diones . CAD (coronary artery disease) of bypass graft   Chronic occlusion of saphenous vein graft to RCA . Coronary artery disease   Left main disease and severe 3-vessel CAD . Diastolic heart failure (HCC)  . Embolism involving retinal artery  . GERD (gastroesophageal reflux disease)  . Hard of hearing  . Hypertension  . Hypothyroidism  . Iliac artery  aneurysm [facility])   Dr. Edilia Bo . PAF (paroxysmal atrial fibrillation) (HCC)  . S/P TAVR (transcatheter aortic valve replacement) 01/15/2013  a. 12/2012: mm Stephannie Peters XT transcatheter heart valve placed via transapical approach . Stroke [facility])   mini stroke effective his eye left Past Surgical History: Past Surgical History: Procedure Laterality Date . BIOPSY PROSTATE  2005 . BUNIONECTOMY WITH HAMMERTOE RECONSTRUCTION Bilateral  . CARDIAC CATHETERIZATION   . CARDIAC CATHETERIZATION N/A 05/07/2015  Procedure: Right Heart Cath and Coronary/Graft Angiography;  Surgeon: Lyn Records, MD;  Location: [facility] INVASIVE CV LAB;  Service: Cardiovascular;  Laterality: N/A; . CAROTID ENDARTERECTOMY Right Jan. 2, 1997 . CATARACT EXTRACTION W/  INTRAOCULAR LENS IMPLANT Right  . CHOLECYSTECTOMY N/A 01/28/2015  Procedure: LAPAROSCOPIC CHOLECYSTECTOMY WITH INTRAOPERATIVE CHOLANGIOGRAM;  Surgeon: Gaynelle Adu, MD;  Location: [facility] OR;  Service: General;  Laterality: N/A; . COLONOSCOPY   . CORONARY ARTERY BYPASS GRAFT  01/1998  Dr. Sheliah Plane; LIMA to LAD, SVG to D1, SVG to LCx, SVG to RCA, open saphenous vein harvest via right lower extremity . HARVEST BONE GRAFT  1982  FOR THE  ANKLE . HEMIARTHROPLASTY HIP Left   AFTER HIP FX . INTRAOPERATIVE TRANSESOPHAGEAL ECHOCARDIOGRAM N/A 01/15/2013  Procedure: INTRAOPERATIVE TRANSESOPHAGEAL ECHOCARDIOGRAM;  Surgeon: [name], MD;  Location[facility] OR;  Service: Open Heart Surgery;  Laterality: N/A; . JOINT REPLACEMENT   . LEFT AND RIGHT HEART CATHETERIZATION WITH CORONARY ANGIOGRAM N/A 12/20/2012  Procedure: LEFT AND RIGHT HEART CATHETERIZATION WITH CORONARY ANGIOGRAM;  Surgeon: Lesleigh Noe, MD;  Location: [facility] CATH LAB;  Service: Cardiovascular;  Laterality: N/A; . LEFT HEART CATH AND CORS/GRAFTS ANGIOGRAPHY N/A 02/14/2017  Procedure: LEFT HEART CATH AND CORS/GRAFTS ANGIOGRAPHY;  Surgeon: Marykay Lex, MD;  Location: [facility] INVASIVE CV LAB;  Service: Cardiovascular;  Laterality: N/A; . SKIN GRAFTS  1968 . TOTAL HIP ARTHROPLASTY Left 2007 . TOTAL KNEE ARTHROPLASTY  1992-2002  right-left . TRANSCATHETER AORTIC VALVE REPLACEMENT, TRANSAPICAL N/A 01/15/2013  Procedure: TRANSCATHETER AORTIC VALVE REPLACEMENT, TRANSAPICAL;  Surgeon: [name], MD;  Location: [facility] OR;  Service: Open Heart Surgery;  Laterality: N/A; . TRANSURETHRAL RESECTION OF PROSTATE  2012 HPI: Hunter Choi is a 85 y.o. male with medical history significant of rheumatoid arthritis on methotrexate and prednisone, CVA, GERD, AAA, BPH, hypothyroidism, CAD, chronic systolic congestive heart failure with EF 30-35%, paroxysmal atrial fibrillation, status post TAVR presenting to the hospital for evaluation of altered mental status, generalized weakness,  and labored breathing.  Patient is confused and mumbling words.  No history could be obtained from him.  When asked what his name was he replied " you are welcome" and " thank you."  No family available at this time.  MRI of the head is showing no acute abnomality, however, mutliple remote lacunar infarcts in the bilateral basal ganglia, thalami and pons as well as multiple chronic bilateral cerebellar infarcts.  Chest xray is showing mild central pulmonary vascular congestion, suspected chronic mild CHF, probable small bilateral pleural effusions and chronic opacities in the bilateral lung bases, suspected chronic atelectasis or scarring/fibrosis.   Subjective: Pt upright in chair. He was pleasant and cooperative during eval Assessment / Plan / Recommendation CHL IP CLINICAL IMPRESSIONS 05/14/2018 Clinical Impression Pt presents with mild pharyngoesophageal dysphagia c/b  timely swallow initiaiton, inconsistent clearance of penetrate, esophageal backflow into pharynx and pill hung in cervical esophagus. Small sips of thin via teaspoon and cup yielded no s/sx of aspiration. When freely drinking with straw and during pill intake, pt  penetrated thin above cords and inconsistenly ejected it via strength of laryngeal closure, independent throat clear x2 and min verbal cues to cough. Only trace penetrate noted when pt unable to fully clear. Mild- mod residue observed with puree and regular textured solid but was cleared with successive indpendent swallows and cues for liquid wash. Pill with thin followed by puree noted to be hung in cervical esophagus. Given improvment  SLP Visit Diagnosis Dysphagia, pharyngoesophageal phase (R13.14) Attention and concentration deficit following -- Frontal lobe and executive function deficit following -- Impact on safety and function Moderate aspiration risk   CHL IP TREATMENT RECOMMENDATION 05/14/2018 Treatment Recommendations Therapy as outlined in treatment plan below   Prognosis  05/14/2018 Prognosis for Safe Diet Advancement Good Barriers to Reach Goals -- Barriers/Prognosis Comment -- CHL IP DIET RECOMMENDATION 05/14/2018 SLP Diet Recommendations Dysphagia 2 (Fine chop) solids;Thin liquid Liquid Administration via Cup;No straw Medication Administration Crushed with puree Compensations Small sips/bites Postural Changes Seated upright at 90 degrees;Remain semi-upright after after feeds/meals (Comment)   CHL IP OTHER RECOMMENDATIONS 05/14/2018 Recommended Consults Consider esophageal assessment Oral Care Recommendations Oral care BID Other Recommendations --   CHL IP FOLLOW UP RECOMMENDATIONS 05/14/2018 Follow up Recommendations Other (comment)   CHL IP FREQUENCY AND DURATION 05/14/2018 Speech Therapy Frequency (ACUTE ONLY) min 2x/week Treatment Duration 2 weeks      CHL IP ORAL PHASE 05/14/2018 Oral Phase WFL Oral - Pudding Teaspoon -- Oral - Pudding Cup -- Oral - Honey Teaspoon -- Oral - Honey Cup -- Oral - Nectar Teaspoon -- Oral - Nectar Cup -- Oral - Nectar Straw -- Oral - Thin Teaspoon -- Oral - Thin Cup -- Oral - Thin Straw -- Oral - Puree -- Oral - Mech Soft -- Oral - Regular -- Oral - Multi-Consistency -- Oral - Pill -- Oral Phase - Comment --  CHL IP PHARYNGEAL PHASE 05/14/2018 Pharyngeal Phase Impaired Pharyngeal- Pudding Teaspoon -- Pharyngeal -- Pharyngeal- Pudding Cup -- Pharyngeal -- Pharyngeal- Honey Teaspoon -- Pharyngeal -- Pharyngeal- Honey Cup -- Pharyngeal -- Pharyngeal- Nectar Teaspoon -- Pharyngeal -- Pharyngeal- Nectar Cup -- Pharyngeal -- Pharyngeal- Nectar Straw -- Pharyngeal -- Pharyngeal- Thin Teaspoon WFL Pharyngeal -- Pharyngeal- Thin Cup WFL;Penetration/Aspiration during swallow Pharyngeal Material enters airway, remains ABOVE vocal cords then ejected out;Material enters airway, remains ABOVE vocal cords and not ejected out Pharyngeal- Thin Straw Penetration/Aspiration during swallow;Compensatory strategies attempted (with notebox) Pharyngeal Material enters airway,  remains ABOVE vocal cords then ejected out;Material enters airway, remains ABOVE vocal cords and not ejected out Pharyngeal- Puree WFL Pharyngeal -- Pharyngeal- Mechanical Soft -- Pharyngeal -- Pharyngeal- Regular WFL;Pharyngeal residue - valleculae Pharyngeal -- Pharyngeal- Multi-consistency -- Pharyngeal -- Pharyngeal- Pill WFL Pharyngeal -- Pharyngeal Comment --  CHL IP CERVICAL ESOPHAGEAL PHASE 05/14/2018 Cervical Esophageal Phase Impaired Pudding Teaspoon -- Pudding Cup -- Honey Teaspoon -- Honey Cup -- Nectar Teaspoon -- Nectar Cup -- Nectar Straw -- Thin Teaspoon -- Thin Cup -- Thin Straw Esophageal backflow into the pharynx;Reduced cricopharyngeal relaxation Puree -- Mechanical Soft -- Regular -- Multi-consistency -- Pill Esophageal backflow into the pharynx;Reduced cricopharyngeal relaxation Cervical Esophageal Comment (No Data) Skip Mayer 05/14/2018, 2:08 PM  Note populated on behalf of Gardiner Ramus, Student SLP Natalia Leatherwood, M.A. CCC-SLP Acute Rehabilitation Services Pager [phone] Office [phone]              Echo 05/13/2018:1. The left ventricle has moderate-severely reduced systolic function, with an ejection fraction of 30-35%. The cavity size was severely dilated. Left ventricular diastology could  not be evaluated due to nondiagnostic images. Left ventricular diffuse  hypokinesis. 2. The right ventricle has normal systolic function. The cavity was normal. There is no increase in right ventricular wall thickness. 3. Left atrial size was severely dilated. 4. Right atrial size was mildly dilated. 5. The mitral valve is degenerative. Mild thickening of the mitral valve leaflet. There is moderate mitral annular calcification present. Mitral valve regurgitation is moderate by color flow Doppler. No evidence of mitral valve stenosis. 6. The tricuspid valve is normal in structure. 7. A valve is present in the aortic position. Normal aortic valve prosthesis. 8. S/P TAVR with  normal mean gradient of 6.908mmHg, AV peak velocty 170cm/s and AVA 2.7cm2. 9. The pulmonic valve was normal in structure. Pulmonic valve regurgitation is mild by color flow Doppler.  10. Right atrial pressure is estimated at 8 mmHg.   Subjective:   Discharge Exam: Vitals:   05/16/18 2058 05/17/18 0616  BP: (!) 122/51 (!) 128/54  Pulse: 67 67  Resp: 16 16  Temp: 98.4 F (36.9 C) 99.4 F (37.4 C)  SpO2: 95% 99%   Vitals:   05/16/18 0426 05/16/18 1807 05/16/18 2058 05/17/18 0616  BP: (!) 125/55 (!) 119/52 (!) 122/51 (!) 128/54  Pulse: 66 65 67 67  Resp: 18  16 16   Temp: 98.3 F (36.8 C) 98 F (36.7 C) 98.4 F (36.9 C) 99.4 F (37.4 C)  TempSrc: Oral Oral Oral Oral  SpO2: 98% 93% 95% 99%  Weight:    61.4 kg  Height:        General exam: Appears calm and comfortable  Respiratory system: No increased work of breathing, diminished lung sounds at bases, no wheezes, crackles, rhonchi Cardiovascular system: Regular rate and rhythm, no murmurs Gastrointestinal system: Soft, nontender, no rebound or guarding, plus bowel sounds. Central nervous system: Alert and oriented to person, situation, place.  Requires prompting to oriented time, extra movements are limited by pain from arthritis but no focal weakness Extremities: Trace lower extremity edema, ulnar deviation of fingers, boutonnieres deformity noted Skin: No rashes over visible skin Psychiatry:  Judgment and insight appropriate    The results of significant diagnostics from this hospitalization (including imaging, microbiology, ancillary and laboratory) are listed below for reference.     Microbiology: Recent Results (from the past 240 hour(s))  Culture, blood (routine x 2)     Status: None (Preliminary result)   Collection Time: 05/12/18  4:35 PM  Result Value Ref Range Status   Specimen Description BLOOD RIGHT FOREARM  Final   Special Requests   Final    BOTTLES DRAWN AEROBIC AND ANAEROBIC Blood Culture results may  not be optimal due to an excessive volume of blood received in culture bottles   Culture   Final    NO GROWTH 4 DAYS Performed at [facility], [address] [place], [place] [address]    Report Status PENDING  Incomplete  Culture, blood (routine x 2)     Status: None (Preliminary result)   Collection Time: 05/12/18  7:11 PM  Result Value Ref Range Status   Specimen Description BLOOD RIGHT ANTECUBITAL  Final   Special Requests   Final    BOTTLES DRAWN AEROBIC ONLY Blood Culture results may not be optimal due to an inadequate volume of blood received in culture bottles   Culture   Final    NO GROWTH 4 DAYS Performed at [facility], [address] [place], [place] [address]  Report Status PENDING  Incomplete     Labs: BNP (last 3 results) Recent Labs    12/09/17 1300 03/22/18 0359 05/12/18 1603  BNP 2,836.3* 1,508.9* >4,500.0*   Basic Metabolic Panel: Recent Labs  Lab 05/12/18 1603 05/12/18 1936 05/13/18 0732 05/14/18 0621 05/16/18 0323  NA 135 136 138 138 138  K 4.4 4.4 3.9 3.9 3.8  CL 97*  --  99 97* 97*  CO2 26  --  28 29 33*  GLUCOSE 132*  --  88 82 114*  BUN 26*  --  26* 24* 33*  CREATININE 1.50*  --  1.46* 1.49* 1.25*  CALCIUM 8.5*  --  8.4* 8.3* 8.5*  MG  --   --   --  2.4 2.3   Liver Function Tests: Recent Labs  Lab 05/12/18 1603 05/14/18 0621 05/16/18 0323  AST 24 20 20   ALT 13 11 12   ALKPHOS 62 52 54  BILITOT 0.7 1.1 0.7  PROT 6.8 6.4* 6.3*  ALBUMIN 3.4* 3.0* 2.8*   No results for input(s): LIPASE, AMYLASE in the last 168 hours. Recent Labs  Lab 05/12/18 2210  AMMONIA 11   CBC: Recent Labs  Lab 05/12/18 1603 05/12/18 1936 05/13/18 0732 05/14/18 0621 05/16/18 0323  WBC 10.6*  --  8.0 8.5 8.8  NEUTROABS  --   --   --  4.5 4.2  HGB 9.2* 9.9* 8.3* 9.0* 9.6*  HCT 31.2* 29.0* 28.6* 29.9* 31.4*  MCV 92.9  --  91.1 90.1 89.2  PLT 303  --  260 283 253   Cardiac Enzymes: No results for input(s): CKTOTAL, CKMB, CKMBINDEX,  TROPONINI in the last 168 hours. BNP: Invalid input(s): POCBNP CBG: Recent Labs  Lab 05/12/18 1631  GLUCAP 125*   D-Dimer No results for input(s): DDIMER in the last 72 hours. Hgb A1c No results for input(s): HGBA1C in the last 72 hours. Lipid Profile No results for input(s): CHOL, HDL, LDLCALC, TRIG, CHOLHDL, LDLDIRECT in the last 72 hours. Thyroid function studies No results for input(s): TSH, T4TOTAL, T3FREE, THYROIDAB in the last 72 hours.  Invalid input(s): FREET3 Anemia work up No results for input(s): VITAMINB12, FOLATE, FERRITIN, TIBC, IRON, RETICCTPCT in the last 72 hours. Urinalysis    Component Value Date/Time   COLORURINE YELLOW 05/12/2018 1620   APPEARANCEUR CLEAR 05/12/2018 1620   LABSPEC 1.009 05/12/2018 1620   PHURINE 7.0 05/12/2018 1620   GLUCOSEU NEGATIVE 05/12/2018 1620   HGBUR NEGATIVE 05/12/2018 1620   BILIRUBINUR NEGATIVE 05/12/2018 1620   KETONESUR NEGATIVE 05/12/2018 1620   PROTEINUR NEGATIVE 05/12/2018 1620   UROBILINOGEN 4.0 (H) 11/14/2014 1849   NITRITE NEGATIVE 05/12/2018 1620   LEUKOCYTESUR NEGATIVE 05/12/2018 1620   Sepsis Labs Invalid input(s): PROCALCITONIN,  WBC,  LACTICIDVEN Microbiology Recent Results (from the past 240 hour(s))  Culture, blood (routine x 2)     Status: None (Preliminary result)   Collection Time: 05/12/18  4:35 PM  Result Value Ref Range Status   Specimen Description BLOOD RIGHT FOREARM  Final   Special Requests   Final    BOTTLES DRAWN AEROBIC AND ANAEROBIC Blood Culture results may not be optimal due to an excessive volume of blood received in culture bottles   Culture   Final    NO GROWTH 4 DAYS Performed at [facility], [address] [place], [place] [address]    Report Status PENDING  Incomplete  Culture, blood (routine x 2)     Status: None (Preliminary result)   Collection  Time: 05/12/18  7:11 PM  Result Value Ref Range Status   Specimen Description BLOOD RIGHT ANTECUBITAL  Final   Special  Requests   Final    BOTTLES DRAWN AEROBIC ONLY Blood Culture results may not be optimal due to an inadequate volume of blood received in culture bottles   Culture   Final    NO GROWTH 4 DAYS Performed at [facility], [address] [place], [place] [address]    Report Status PENDING  Incomplete     Time coordinating discharge: 35  SIGNED:   Delaine Lame, MD  [facility] 05/17/2018, 9:58 AM   If 7PM-7AM, please contact night-coverage [email or web address] Password [email or web address]

## 2018-05-17 NOTE — Discharge Instructions (Signed)
Heart Failure °Heart failure is a condition in which the heart has trouble pumping blood because it has become weak or stiff. This means that the heart does not pump blood efficiently for the body to work well. For some people with heart failure, fluid may back up into the lungs and there may be swelling (edema) in the lower legs. Heart failure is usually a long-term (chronic) condition. It is important for you to take good care of yourself and follow the treatment plan from your health care provider. °What are the causes? °This condition is caused by some health problems, including: °· High blood pressure (hypertension). Hypertension causes the heart muscle to work harder than normal. High blood pressure eventually causes the heart to become stiff and weak. °· Coronary artery disease (CAD). CAD is the buildup of cholesterol and fat (plaques) in the arteries of the heart. °· Heart attack (myocardial infarction). Injured tissue, which is caused by the heart attack, does not contract as well and the heart's ability to pump blood is weakened. °· Abnormal heart valves. When the heart valves do not open and close properly, the heart muscle must pump harder to keep the blood flowing. °· Heart muscle disease (cardiomyopathy or myocarditis). Heart muscle disease is damage to the heart muscle from a variety of causes, such as drug or alcohol abuse, infections, or unknown causes. These can increase the risk of heart failure. °· Lung disease. When the lungs do not work properly, the heart must work harder. °What increases the risk? °Risk of heart failure increases as a person ages. This condition is also more likely to develop in people who: °· Are overweight. °· Are male. °· Smoke or chew tobacco. °· Abuse alcohol or illegal drugs. °· Have taken medicines that can damage the heart, such as chemotherapy drugs. °· Have diabetes. °? High blood sugar (glucose) is associated with high fat (lipid) levels in the blood. °? Diabetes  can also damage tiny blood vessels that carry nutrients to the heart muscle. °· Have abnormal heart rhythms. °· Have thyroid problems. °· Have low blood counts (anemia). °What are the signs or symptoms? °Symptoms of this condition include: °· Shortness of breath with activity, such as when climbing stairs. °· Persistent cough. °· Swelling of the feet, ankles, legs, or abdomen. °· Unexplained weight gain. °· Difficulty breathing when lying flat (orthopnea). °· Waking from sleep because of the need to sit up and get more air. °· Rapid heartbeat. °· Fatigue and loss of energy. °· Feeling light-headed, dizzy, or close to fainting. °· Loss of appetite. °· Nausea. °· Increased urination during the night (nocturia). °· Confusion. °How is this diagnosed? °This condition is diagnosed based on: °· Medical history, symptoms, and a physical exam. °· Diagnostic tests, which may include: °? Echocardiogram. °? Electrocardiogram (ECG). °? Chest X-ray. °? Blood tests. °? Exercise stress test. °? Radionuclide scans. °? Cardiac catheterization and angiogram. °How is this treated? °Treatment for this condition is aimed at managing the symptoms of heart failure. Medicines, behavioral changes, or other treatments may be necessary to treat heart failure. °Medicines °These may include: °· Angiotensin-converting enzyme (ACE) inhibitors. This type of medicine blocks the effects of a blood protein called angiotensin-converting enzyme. ACE inhibitors relax (dilate) the blood vessels and help to lower blood pressure. °· Angiotensin receptor blockers (ARBs). This type of medicine blocks the actions of a blood protein called angiotensin. ARBs dilate the blood vessels and help to lower blood pressure. °· Water pills (diuretics). Diuretics cause   the kidneys to remove salt and water from the blood. The extra fluid is removed through urination, leaving a lower volume of blood that the heart has to pump. °· Beta blockers. These improve heart muscle  strength and they prevent the heart from beating too quickly. °· Digoxin. This increases the force of the heartbeat. °Healthy behavior changes °These may include: °· Reaching and maintaining a healthy weight. °· Stopping smoking or chewing tobacco. °· Eating heart-healthy foods. °· Limiting or avoiding alcohol. °· Stopping use of street drugs (illegal drugs). °· Physical activity. °Other treatments °These may include: °· Surgery to open blocked coronary arteries or repair damaged heart valves. °· Placement of a biventricular pacemaker to improve heart muscle function (cardiac resynchronization therapy). This device paces both the right ventricle and left ventricle. °· Placement of a device to treat serious abnormal heart rhythms (implantable cardioverter defibrillator, or ICD). °· Placement of a device to improve the pumping ability of the heart (left ventricular assist device, or LVAD). °· Heart transplant. This can cure heart failure, and it is considered for certain patients who do not improve with other therapies. °Follow these instructions at home: °Medicines °· Take over-the-counter and prescription medicines only as told by your health care provider. Medicines are important in reducing the workload of your heart, slowing the progression of heart failure, and improving your symptoms. °? Do not stop taking your medicine unless your health care provider told you to do that. °? Do not skip any dose of medicine. °? Refill your prescriptions before you run out of medicine. You need your medicines every day. °Eating and drinking ° °· Eat heart-healthy foods. Talk with a dietitian to make an eating plan that is right for you. °? Choose foods that contain no trans fat and are low in saturated fat and cholesterol. Healthy choices include fresh or frozen fruits and vegetables, fish, lean meats, legumes, fat-free or low-fat dairy products, and whole-grain or high-fiber foods. °? Limit salt (sodium) if directed by your  health care provider. Sodium restriction may reduce symptoms of heart failure. Ask a dietitian to recommend heart-healthy seasonings. °? Use healthy cooking methods instead of frying. Healthy methods include roasting, grilling, broiling, baking, poaching, steaming, and stir-frying. °· Limit your fluid intake if directed by your health care provider. Fluid restriction may reduce symptoms of heart failure. °Lifestyle ° °· Stop smoking or using chewing tobacco. Nicotine and tobacco can damage your heart and your blood vessels. Do not use nicotine gum or patches before talking to your health care provider. °· Limit alcohol intake to no more than 1 drink per day for non-pregnant women and 2 drinks per day for men. One drink equals 12 oz of beer, 5 oz of wine, or 1½ oz of hard liquor. °? Drinking more than that is harmful to your heart. Tell your health care provider if you drink alcohol several times a week. °? Talk with your health care provider about whether any level of alcohol use is safe for you. °? If your heart has already been damaged by alcohol or you have severe heart failure, drinking alcohol should be stopped completely. °· Stop use of illegal drugs. °· Lose weight if directed by your health care provider. Weight loss may reduce symptoms of heart failure. °· Do moderate physical activity if directed by your health care provider. People who are elderly and people with severe heart failure should consult with a health care provider for physical activity recommendations. °Monitor important information ° °· Weigh   yourself every day. Keeping track of your weight daily helps you to notice excess fluid sooner. °? Weigh yourself every morning after you urinate and before you eat breakfast. °? Wear the same amount of clothing each time you weigh yourself. °? Record your daily weight. Provide your health care provider with your weight record. °· Monitor and record your blood pressure as told by your health care  provider. °· Check your pulse as told by your health care provider. °Dealing with extreme temperatures °· If the weather is extremely hot: °? Avoid vigorous physical activity. °? Use air conditioning or fans or seek a cooler location. °? Avoid caffeine and alcohol. °? Wear loose-fitting, lightweight, and light-colored clothing. °· If the weather is extremely cold: °? Avoid vigorous physical activity. °? Layer your clothes. °? Wear mittens or gloves, a hat, and a scarf when you go outside. °? Avoid alcohol. °General instructions °· Manage other health conditions such as hypertension, diabetes, thyroid disease, or abnormal heart rhythms as told by your health care provider. °· Learn to manage stress. If you need help to do this, ask your health care provider. °· Plan rest periods when fatigued. °· Get ongoing education and support as needed. °· Participate in or seek rehabilitation as needed to maintain or improve independence and quality of life. °· Stay up to date with immunizations. Keeping current on pneumococcal and influenza immunizations is especially important to prevent respiratory infections. °· Keep all follow-up visits as told by your health care provider. This is important. °Contact a health care provider if: °· You have a rapid weight gain. °· You have increasing shortness of breath that is unusual for you. °· You are unable to participate in your usual physical activities. °· You tire easily. °· You cough more than normal, especially with physical activity. °· You have any swelling or more swelling in areas such as your hands, feet, ankles, or abdomen. °· You are unable to sleep because it is hard to breathe. °· You feel like your heart is beating quickly (palpitations). °· You become dizzy or light-headed when you stand up. °Get help right away if: °· You have difficulty breathing. °· You notice or your family notices a change in your awareness, such as having trouble staying awake or having difficulty  with concentration. °· You have pain or discomfort in your chest. °· You have an episode of fainting (syncope). °This information is not intended to replace advice given to you by your health care provider. Make sure you discuss any questions you have with your health care provider. °Document Released: 03/14/2005 Document Revised: 02/10/2017 Document Reviewed: 10/07/2015 °Elsevier Interactive Patient Education © 2019 [employer] ° °

## 2018-05-17 NOTE — Progress Notes (Signed)
Physical Therapy Treatment Patient Details Name: Hunter Choi MRN: [id number] DOB: [birth year] Today's Date: 05/17/2018    History of Present Illness Pt is an 85 y.o. male admitted 05/12/18 with confusion and SOB; worked up for CHF exacerbation. PMH includes RA, CAD, CVA, AAA, afib, s/p TAVR, blind L eye, HOH (R-side hearing aide).   PT Comments    Pt slowly progressing with mobility. Able to perform squat pivot transfers from bed<>recliner<>BSC with min guard to minA; dependent for pericare. Pt functioning closer to baseline; may be able to return home with [facility] services if son able to continue providing 24/7 assist. Per Social Work note, son has declined SNF and planning for pt to return home with his support.    Follow Up Recommendations  HHPT;Supervision/Assistance - 24 hour (son declined SNF)     Equipment Recommendations  3in1 (PT)    Recommendations for Other Services       Precautions / Restrictions Precautions Precautions: Fall Precaution Comments: Foley, blind L eye, R-side hearing aide  Restrictions Weight Bearing Restrictions: No    Mobility  Bed Mobility Overal bed mobility: Needs Assistance Bed Mobility: Supine to Sit;Sit to Supine     Supine to sit: Supervision Sit to supine: Supervision   General bed mobility comments: Sat EOB, then layed back down unannounced, able to sit again, all without physical assistance; increased time and effort  Transfers Overall transfer level: Needs assistance Equipment used: None Transfers: Squat Pivot Transfers;Sit to/from Stand Sit to Stand: Mod assist   Squat pivot transfers: Min assist;Min guard     General transfer comment: Heavy modA to assist standing with HHA, pt unable to achieve fully upright, assist to block R knee; partial standing from [facility], heavy reliance on BUE support to push into standing, dependent for pericare. Pt able to transfer bed to recliner to [facility] to recliner via squat pivot transfer, requiring minA  to min guard for safety  Ambulation/Gait                 Stairs             Wheelchair Mobility    Modified Rankin (Stroke Patients Only)       Balance Overall balance assessment: Needs assistance Sitting-balance support: No upper extremity supported Sitting balance-Leahy Scale: Good     Standing balance support: Bilateral upper extremity supported Standing balance-Leahy Scale: Poor Standing balance comment: Reliant on BUE support                            Cognition Arousal/Alertness: Awake/alert Behavior During Therapy: WFL for tasks assessed/performed Overall Cognitive Status: No family/caregiver present to determine baseline cognitive functioning                                        Exercises      General Comments        Pertinent Vitals/Pain Pain Assessment: No/denies pain    Home Living                      Prior Function            PT Goals (current goals can now be found in the care plan section) Acute Rehab PT Goals Patient Stated Goal: go home PT Goal Formulation: With patient Time For Goal Achievement: 05/28/18 Potential to Achieve Goals: Fair Progress  towards PT goals: Progressing toward goals    Frequency    Min 3X/week      PT Plan Discharge plan needs to be updated;Frequency needs to be updated    Co-evaluation              AM-PAC PT "6 Clicks" Mobility   Outcome Measure  Help needed turning from your back to your side while in a flat bed without using bedrails?: None Help needed moving from lying on your back to sitting on the side of a flat bed without using bedrails?: None Help needed moving to and from a bed to a chair (including a wheelchair)?: A Little Help needed standing up from a chair using your arms (e.g., wheelchair or bedside chair)?: A Lot Help needed to walk in hospital room?: Total Help needed climbing 3-5 steps with a railing? : Total 6 Click Score:  15    End of Session Equipment Utilized During Treatment: Gait belt Activity Tolerance: Patient tolerated treatment well Patient left: in chair;with call bell/phone within reach;with chair alarm set Nurse Communication: Mobility status PT Visit Diagnosis: Unsteadiness on feet (R26.81);Other abnormalities of gait and mobility (R26.89);Muscle weakness (generalized) (M62.81);Difficulty in walking, not elsewhere classified (R26.2)     Time: 4782-9562 PT Time Calculation (min) (ACUTE ONLY): 19 min  Charges:  $Therapeutic Activity: 8-22 mins                    Ina Homes, PT, DPT Acute Rehabilitation Services  Pager [phone] Office [phone]  Malachy Chamber 05/17/2018, 9:33 AM

## 2018-05-17 NOTE — Progress Notes (Signed)
Pt. With 19 beats of VTach this am. On call for [facility] paged to make aware. Pt. Resting in bed. No distress noted. VSS. RN will continue to monitor.

## 2018-05-21 ENCOUNTER — Ambulatory Visit: Payer: Medicare Other | Admitting: Physician Assistant

## 2018-05-29 ENCOUNTER — Encounter: Payer: Self-pay | Admitting: Physician Assistant

## 2018-05-29 ENCOUNTER — Ambulatory Visit: Payer: Medicare Other | Admitting: Physician Assistant

## 2018-05-29 VITALS — BP 118/68 | HR 59 | Ht 67.0 in | Wt 143.8 lb

## 2018-05-29 DIAGNOSIS — I5042 Chronic combined systolic (congestive) and diastolic (congestive) heart failure: Secondary | ICD-10-CM

## 2018-05-29 DIAGNOSIS — I10 Essential (primary) hypertension: Secondary | ICD-10-CM

## 2018-05-29 DIAGNOSIS — I25709 Atherosclerosis of coronary artery bypass graft(s), unspecified, with unspecified angina pectoris: Secondary | ICD-10-CM

## 2018-05-29 DIAGNOSIS — Z952 Presence of prosthetic heart valve: Secondary | ICD-10-CM

## 2018-05-29 NOTE — Patient Instructions (Signed)
Medication Instructions:  Your physician recommends that you continue on your current medications as directed. Please refer to the Current Medication list given to you today.  If you need a refill on your cardiac medications before your next appointment, please call your pharmacy.   Lab work: None ordered  If you have labs (blood work) drawn today and your tests are completely normal, you will receive your results only by: Marland Kitchen MyChart Message (if you have MyChart) OR . A paper copy in the mail If you have any lab test that is abnormal or we need to change your treatment, we will call you to review the results.  Testing/Procedures: None ordered   Follow-Up: At [facility], you and your health needs are our priority.  As part of our continuing mission to provide you with exceptional heart care, we have created designated Provider Care Teams.  These Care Teams include your primary Cardiologist (physician) and Advanced Practice Providers (APPs -  Physician Assistants and Nurse Practitioners) who all work together to provide you with the care you need, when you need it. You will need a follow up appointment in:  3 months.  Please call our office 2 months in advance to schedule this appointment.  You may see Lesleigh Noe, MD or one of the following Advanced Practice Providers on your designated Care Team: Tereso Newcomer, PA-C [place], [place] . Berton Bon, NP  Any Other Special Instructions Will Be Listed Below (If Applicable).

## 2018-05-29 NOTE — Progress Notes (Signed)
Cardiology Office Note    Date:  05/29/2018   ID:  Hunter HoarClyde G Carrera, DOB 1927/08/15, MRN [id number]  PCP:  Lorenda IshiharaVaradarajan, [name], MD  Cardiologist:  Dr. Katrinka BlazingSmith   Chief Complaint: Hospital follow up   History of Present Illness:   Hunter Choi is a 85 y.o. male history of TAVR for aortic stenosis in 2014, CAD with prior CABG in 1999 with bypass graft occlusion, hearing loss, PAF - not on anticoagulation, chronic combined systolic and diastolic heart failure, RA, CVA  and chronic kidney disease III presents for hospital follow up.   His last cath was November 2018 which showed three-vessel native CAD with LIMA to the LAD patent, SVG to D1/ramus patent, 70% focal lesion in the SVG to the OM, occluded SVG of the distal RCA with severe LV dysfunction EF 25 to 35%.  Medical management was recommended as opposed to PCI of the vein graft to the OM due to being a 85 year old graft and was felt to be too high of a risk procedure.  He was doing well on cardiac stand point when last seen by Norma FredricksonLori Gerhardt, NP 02/2018.  Recently admitted 04/2018 for heart failure exacerbation and AMS.  Echo showed EF was stable at around 30 to 35%.  Patient initially was given IV furosemide and then transition to oral furosemide. Cardiology not consulted during admission.   Here today for follow up with his son who lives with him.  No chest pain, shortness of breath, orthopnea, PND, syncope, lower extremity edema, melena or bloody stool or urine.  Compliant with low-sodium diet.  Checks weight every day.  Stable.  Does home health PT.  Past Medical History:  Diagnosis Date  . AAA (abdominal aortic aneurysm) (HCC)    Dr. Ashley Royalty. Dickson  . Aortic stenosis, severe    a. s/p TAVR  . Blind left eye   . BPH (benign prostatic hypertrophy)    Dr. Retta Dionesahlstedt  . CAD (coronary artery disease) of bypass graft    Chronic occlusion of saphenous vein graft to RCA  . Coronary artery disease    Left main disease and severe 3-vessel  CAD  . Diastolic heart failure (HCC)   . Embolism involving retinal artery   . GERD (gastroesophageal reflux disease)   . Hard of hearing   . Hypertension   . Hypothyroidism   . Iliac artery aneurysm (HCC)    Dr. Edilia Boickson  . PAF (paroxysmal atrial fibrillation) (HCC)   . 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 12/2012: mm Stephannie PetersEdwards Sapien XT transcatheter heart valve placed via transapical approach  . Stroke [facility](HCC)    mini stroke effective his eye left    Past Surgical History:  Procedure Laterality Date  . BIOPSY PROSTATE  2005  . BUNIONECTOMY WITH HAMMERTOE RECONSTRUCTION Bilateral   . CARDIAC CATHETERIZATION    . CARDIAC CATHETERIZATION N/A 05/07/2015   Procedure: Right Heart Cath and Coronary/Graft Angiography;  Surgeon: Lyn RecordsHenry W Smith, MD;  [facility];  Service: Cardiovascular;  Laterality: N/A;  . CAROTID ENDARTERECTOMY Right Jan. 2, 1997  . CATARACT EXTRACTION W/ INTRAOCULAR LENS IMPLANT Right   . CHOLECYSTECTOMY N/A 01/28/2015   Procedure: LAPAROSCOPIC CHOLECYSTECTOMY WITH INTRAOPERATIVE CHOLANGIOGRAM;  Surgeon: Gaynelle AduEric Wilson, MD;  [facility];  Service: General;  Laterality: N/A;  . COLONOSCOPY    . CORONARY ARTERY BYPASS GRAFT  01/1998   Dr. Sheliah PlaneEdward Gerhardt; LIMA to LAD, SVG to D1, SVG to LCx, SVG to RCA, open  saphenous vein harvest via right lower extremity  . HARVEST BONE GRAFT  1982   FOR THE  ANKLE  . HEMIARTHROPLASTY HIP Left    AFTER HIP FX  . INTRAOPERATIVE TRANSESOPHAGEAL ECHOCARDIOGRAM N/A 01/15/2013   Procedure: INTRAOPERATIVE TRANSESOPHAGEAL ECHOCARDIOGRAM;  Surgeon: Alleen BorneBryan K Bartle, MD;  [facility];  Service: Open Heart Surgery;  Laterality: N/A;  . JOINT REPLACEMENT    . LEFT AND RIGHT HEART CATHETERIZATION WITH CORONARY ANGIOGRAM N/A 12/20/2012   Procedure: LEFT AND RIGHT HEART CATHETERIZATION WITH CORONARY ANGIOGRAM;  Surgeon: Lesleigh NoeHenry W Smith III, MD;  [facility];  Service: Cardiovascular;  Laterality:  N/A;  . LEFT HEART CATH AND CORS/GRAFTS ANGIOGRAPHY N/A 02/14/2017   Procedure: LEFT HEART CATH AND CORS/GRAFTS ANGIOGRAPHY;  Surgeon: Marykay LexHarding, David W, MD;  [facility];  Service: Cardiovascular;  Laterality: N/A;  . SKIN GRAFTS  1968  . TOTAL HIP ARTHROPLASTY Left 2007  . TOTAL KNEE ARTHROPLASTY  1992-2002   right-left  . TRANSCATHETER AORTIC VALVE REPLACEMENT, TRANSAPICAL N/A 01/15/2013   Procedure: TRANSCATHETER AORTIC VALVE REPLACEMENT, TRANSAPICAL;  Surgeon: Alleen BorneBryan K Bartle, MD;  Location: [facility] OR;  Service: Open Heart Surgery;  Laterality: N/A;  . TRANSURETHRAL RESECTION OF PROSTATE  2012    Current Medications: Prior to Admission medications   Medication Sig Start Date End Date Taking? Authorizing Provider  acetaminophen (TYLENOL) 500 MG tablet Take 1,000 mg by mouth 2 (two) times daily.    [provider]  Amino Acids-Protein Hydrolys (FEEDING SUPPLEMENT, PRO-STAT SUGAR FREE 64,) LIQD Take 30 mLs by mouth 2 (two) times daily. 03/27/18   Dhungel, Nishant, MD  amLODipine (NORVASC) 5 MG tablet Take 5 mg by mouth daily.    [provider]  aspirin EC 81 MG EC tablet Take 1 tablet (81 mg total) by mouth daily. 01/21/13   Ardelle BallsZimmerman, Donielle M, PA-C  atorvastatin (LIPITOR) 20 MG tablet Take 20 mg by mouth daily.    [provider]  Calcium Carbonate Antacid (TUMS PO) Take 1 tablet by mouth 2 (two) times daily as needed (heartburn/indigestion).    [provider]  carvedilol (COREG) 6.25 MG tablet Take 1 tablet (6.25 mg total) by mouth 2 (two) times daily with a meal. 10/21/15   Lyn RecordsSmith, Henry W, MD  clopidogrel (PLAVIX) 75 MG tablet Take 1 tablet (75 mg total) by mouth daily. 05/14/18   Leone BrandIngold, Laura R, NP  famotidine (PEPCID) 20 MG tablet Take 20 mg by mouth 2 (two) times daily as needed for heartburn or indigestion.    [provider]  feeding supplement, ENSURE ENLIVE, (ENSURE ENLIVE) LIQD Take 237 mLs by mouth 3 (three) times daily  between meals. 03/27/18   Dhungel, Theda BelfastNishant, MD  fluticasone (FLONASE) 50 MCG/ACT nasal spray Place 1 spray into both nostrils at bedtime as needed for allergies or rhinitis.    [provider]  folic acid (FOLVITE) 1 MG tablet Take 3 mg by mouth daily.    [provider]  furosemide (LASIX) 20 MG tablet TAKE 2 TABLETS BY MOUTH ONE TIME A DAY Patient taking differently: Take 40 mg by mouth daily.  02/19/18   Lyn RecordsSmith, Henry W, MD  levothyroxine (SYNTHROID, LEVOTHROID) 100 MCG tablet Take 100 mcg by mouth daily before breakfast. 05/09/18   [provider]  levothyroxine (SYNTHROID, LEVOTHROID) 88 MCG tablet Take 1 tablet (88 mcg total) by mouth daily. Patient not taking: Reported on 05/12/2018 03/27/18   Dhungel, Theda BelfastNishant, MD  losartan (COZAAR) 25 MG tablet Take  25 mg by mouth daily. 03/19/18   [provider]  methotrexate 2.5 MG tablet Take 25 mg by mouth every Friday.     [provider]  Multiple Vitamin (MULTIVITAMIN WITH MINERALS) TABS tablet Take 1 tablet by mouth daily. 03/27/18   Dhungel, Nishant, MD  nitroGLYCERIN (NITROSTAT) 0.4 MG SL tablet PLACE 1 TABLET (0.4 MG TOTAL) UNDER THE TONGUE EVERY 5 MINUTES AS NEEDED FOR CHEST PAIN Patient taking differently: Place 0.4 mg under the tongue every 5 (five) minutes as needed for chest pain.  12/21/16   Lyn Records, MD  pantoprazole (PROTONIX) 40 MG tablet Take 1 tablet (40 mg total) by mouth daily. Patient not taking: Reported on 05/12/2018 02/18/17   Azalee Course, PA  predniSONE (DELTASONE) 5 MG tablet Take 5 mg by mouth daily. 04/07/15   [provider]  vitamin B-12 (CYANOCOBALAMIN) 1000 MCG tablet Take 1 tablet (1,000 mcg total) by mouth daily. 03/27/18   Dhungel, Theda Belfast, MD    Allergies:   Ciprofloxacin   Social History   Socioeconomic History  . Marital status: Married    Spouse name: Not on file  . Number of children: Not on file  . Years of education: Not on file  . Highest education  level: Not on file  Occupational History  . Occupation: Retired  Engineer, production  . Financial resource strain: Not on file  . Food insecurity:    Worry: Not on file    Inability: Not on file  . Transportation needs:    Medical: Not on file    Non-medical: Not on file  Tobacco Use  . Smoking status: Former Smoker    Packs/day: 0.50    Years: 37.00    Pack years: 18.50    Types: Cigarettes    Last attempt to quit: 03/29/1963    Years since quitting: 55.2  . Smokeless tobacco: Former Neurosurgeon    Types: Chew  Substance and Sexual Activity  . Alcohol use: No  . Drug use: No  . Sexual activity: Not on file  Lifestyle  . Physical activity:    Days per week: Not on file    Minutes per session: Not on file  . Stress: Not on file  Relationships  . Social connections:    Talks on phone: Not on file    Gets together: Not on file    Attends religious service: Not on file    Active member of club or organization: Not on file    Attends meetings of clubs or organizations: Not on file    Relationship status: Not on file  Other Topics Concern  . Not on file  Social History Narrative  . Not on file     Family History:  The patient's family history includes Heart attack in his brother, father, and mother; Heart disease in his brother, father, and mother; Hyperlipidemia in his brother, father, and mother; Hypertension in his brother, father, mother, and son.   ROS:   Please see the history of present illness.    ROS All other systems reviewed and are negative.   PHYSICAL EXAM:   VS:  BP 118/68   Pulse (!) 59   Ht 5\' 7"  (1.702 m)   Wt 143 lb 12.8 oz (65.2 kg)   SpO2 97%   BMI 22.52 kg/m    GEN: Well nourished, well developed, in no acute distress  HEENT: normal  Neck: no JVD, carotid bruits, or masses Cardiac: RRR; no murmurs, rubs, or gallops,no  edema  Respiratory:  clear to auscultation bilaterally, normal work of breathing GI: soft, nontender, nondistended, + BS MS: no deformity  or atrophy  Skin: warm and dry, no rash Neuro:  Alert and Oriented x 3, Strength and sensation are intact Psych: euthymic mood, full affect  Wt Readings from Last 3 Encounters:  05/29/18 143 lb 12.8 oz (65.2 kg)  05/17/18 135 lb 5.8 oz (61.4 kg)  04/30/18 153 lb 14.1 oz (69.8 kg)      Studies/Labs Reviewed:   EKG:  EKG is ordered today.  The ekg ordered today demonstrates sinus bradycardia at rate of 59 bpm, T wave abnormality inferior laterally which is chronic.  Recent Labs: 05/12/2018: B Natriuretic Peptide >4,500.0; TSH 15.166 05/16/2018: ALT 12; BUN 33; Creatinine, Ser 1.25; Hemoglobin 9.6; Magnesium 2.3; Platelets 253; Potassium 3.8; Sodium 138   Lipid Panel    Component Value Date/Time   CHOL 84 02/21/2014 0210   TRIG 93 02/21/2014 0210   HDL 27 (L) 02/21/2014 0210   CHOLHDL 3.1 02/21/2014 0210   VLDL 19 02/21/2014 0210   LDLCALC 38 02/21/2014 0210    Additional studies/ records that were reviewed today include:   Echocardiogram: 05/13/2018  1. The left ventricle has moderate-severely reduced systolic function, with an ejection fraction of 30-35%. The cavity size was severely dilated. Left ventricular diastology could not be evaluated due to nondiagnostic images. Left ventricular diffuse  hypokinesis.  2. The right ventricle has normal systolic function. The cavity was normal. There is no increase in right ventricular wall thickness.  3. Left atrial size was severely dilated.  4. Right atrial size was mildly dilated.  5. The mitral valve is degenerative. Mild thickening of the mitral valve leaflet. There is moderate mitral annular calcification present. Mitral valve regurgitation is moderate by color flow Doppler. No evidence of mitral valve stenosis.  6. The tricuspid valve is normal in structure.  7. A valve is present in the aortic position. Normal aortic valve prosthesis.  8. S/P TAVR with normal mean gradient of 6.25mmHg, AV peak velocty 170cm/s and AVA 2.7cm2.  9.  The pulmonic valve was normal in structure. Pulmonic valve regurgitation is mild by color flow Doppler. 10. Right atrial pressure is estimated at 8 mmHg.  LEFT HEART CATH AND CORS/GRAFTS ANGIOGRAPHY  02/14/2017  Conclusion     Mid LM to Ost LAD lesion is 65% stenosed.  Ost Cx to Prox Cx lesion is 90% stenosed. Mid Cx lesion is 100% stenosed.  Ost LAD to Prox LAD lesion is 99% stenosed. Prox LAD to Mid LAD lesion is 100% stenosed.  LIMA-LAD graft is widely patent  Ost 1st Diag lesion is 99% stenosed.  SVG-Diag1/Ramus -very large, patulous/ectatic vessel. The grafted vessel is smooth without disease.  SVG-OM - prox Graft lesion is 70% focal, thrombotic lesion in a valve segment.  Prox RCA lesion is 75% stenosed. Dist RCA lesion is 100% stenosed with 100% stenosed side branch in Post Atrio.  SVG-distal RCA origin lesion is 100% stenosed.  There is severe left ventricular systolic dysfunction.  LV end diastolic pressure is moderately elevated.  The left ventricular ejection fraction is 25-35% by visual estimate.   The patient has severe native vessel disease with essentially no native Left Coronary Artery vessels remaining patent.  The native RCA has extensive disease in the proximal segment and then appears to be occluded at the bifurcation of the posterolateral branch and PDA.  The graft to the RPDA is known to be occluded. There are  2 remaining vein grafts are patent, however the vein graft to OM1 focal thrombotic lesion and what appears to be a graft.  The remainder of this graft as well as the SVG-Diag/RI are very patulous/ectatic vessels almost aneurysmal.  Intervention on a 85 year old vein graft with this much disease is likely fraught with high risk for complications.  Given his extreme cardiomyopathy, I fear that attempted PCI of this vessel with potential occlusion of the graft and the downstream vessel would lead to life-threatening hemodynamic instability.  I reviewed  the images with Dr. Excell Seltzer, he and I both believe that the graft is too far disease to risk attempted PCI.  Therefore the plan will be to return the patient to the nursing unit, run heparin for this 48 hours and continue current medical management with addition of Plavix.  Plan: The patient will move to the holding area for sheath to be removed with manual pressure. Restart IV heparin 8 hours after sheath removal. I have little with 300 mg Plavix today and then starts in her milligrams Plavix tomorrow. Based on patient's symptoms, would need to titrate antianginal as well as heart failure medications.  Unfortunately, I was not able to get much of a story from the patient today and would prefer for this to be managed by the primary team.        ASSESSMENT & PLAN:    1. Chronic combined CHF - Recent echo showed stable LVEF at 30-35%.  Euvolemic.  Weight has been stable.  Compliant with low-sodium diet.  No change in therapy.  2. CAD - Last cath in 2018 as above. Plan medical management.   3. PAF - Not on anticoagulation due to fragility.  Maintaining sinus rhythm.  4. S/p TAVR - Last echo 04/2018 with normal mean gradient of 6.53mmHg, AV peak velocty 170cm/s and AVA 2.7cm2.    Medication Adjustments/Labs and Tests Ordered: Current medicines are reviewed at length with the patient today.  Concerns regarding medicines are outlined above.  Medication changes, Labs and Tests ordered today are listed in the Patient Instructions below. Patient Instructions  Medication Instructions:  Your physician recommends that you continue on your current medications as directed. Please refer to the Current Medication list given to you today.  If you need a refill on your cardiac medications before your next appointment, please call your pharmacy.   Lab work: None ordered  If you have labs (blood work) drawn today and your tests are completely normal, you will receive your results only  by: Marland Kitchen MyChart Message (if you have MyChart) OR . A paper copy in the mail If you have any lab test that is abnormal or we need to change your treatment, we will call you to review the results.  Testing/Procedures: None ordered   Follow-Up: At [facility], you and your health needs are our priority.  As part of our continuing mission to provide you with exceptional heart care, we have created designated Provider Care Teams.  These Care Teams include your primary Cardiologist (physician) and Advanced Practice Providers (APPs -  Physician Assistants and Nurse Practitioners) who all work together to provide you with the care you need, when you need it. You will need a follow up appointment in:  3 months.  Please call our office 2 months in advance to schedule this appointment.  You may see Lesleigh Noe, MD or one of the following Advanced Practice Providers on your designated Care Team: Tereso Newcomer, PA-C [place], [place] .  Berton Bon, NP  Any Other Special Instructions Will Be Listed Below (If Applicable).       Lorelei Pont, [place]  05/29/2018 3:16 PM    [facility] [address] [place], [place], [place]  [address] Phone: [phone]; Fax: [phone]

## 2018-08-29 ENCOUNTER — Telehealth: Payer: Self-pay

## 2018-08-29 NOTE — Telephone Encounter (Signed)
LM for pt to call back to switch his OV to a VV with Dr Katrinka Blazing on 6/8 and move appt time to 2:30. Also need to document consent.

## 2018-08-30 NOTE — Telephone Encounter (Signed)
Spoke to son, DPR on file.  He was agreeable to a phone visit with Dr. Katrinka Blazing on Monday.  Unable to do video visit.  Son verbalized understanding and was appreciative for call.     Virtual Visit Pre-Appointment Phone Call  "(Name), I am calling you today to discuss your upcoming appointment. We are currently trying to limit exposure to the virus that causes COVID-19 by seeing patients at home rather than in the office."  1. "What is the BEST phone number to call the day of the visit?" - include this in appointment notes  2. "Do you have or have access to (through a family member/friend) a smartphone with video capability that we can use for your visit?" a. If yes - list this number in appt notes as "cell" (if different from BEST phone #) and list the appointment type as a VIDEO visit in appointment notes b. If no - list the appointment type as a PHONE visit in appointment notes  3. Confirm consent - "In the setting of the current Covid19 crisis, you are scheduled for a (phone or video) visit with your provider on (date) at (time).  Just as we do with many in-office visits, in order for you to participate in this visit, we must obtain consent.  If you'd like, I can send this to your mychart (if signed up) or email for you to review.  Otherwise, I can obtain your verbal consent now.  All virtual visits are billed to your insurance company just like a normal visit would be.  By agreeing to a virtual visit, we'd like you to understand that the technology does not allow for your provider to perform an examination, and thus may limit your provider's ability to fully assess your condition. If your provider identifies any concerns that need to be evaluated in person, we will make arrangements to do so.  Finally, though the technology is pretty good, we cannot assure that it will always work on either your or our end, and in the setting of a video visit, we may have to convert it to a phone-only visit.  In  either situation, we cannot ensure that we have a secure connection.  Are you willing to proceed?" STAFF: Did the patient verbally acknowledge consent to telehealth visit? Document YES/NO here: yes  4. Advise patient to be prepared - "Two hours prior to your appointment, go ahead and check your blood pressure, pulse, oxygen saturation, and your weight (if you have the equipment to check those) and write them all down. When your visit starts, your provider will ask you for this information. If you have an Apple Watch or Kardia device, please plan to have heart rate information ready on the day of your appointment. Please have a pen and paper handy nearby the day of the visit as well."  5. Give patient instructions for MyChart download to smartphone OR Doximity/Doxy.me as below if video visit (depending on what platform provider is using)  6. Inform patient they will receive a phone call 15 minutes prior to their appointment time (may be from unknown caller ID) so they should be prepared to answer    TELEPHONE CALL NOTE  Hunter Choi has been deemed a candidate for a follow-up tele-health visit to limit community exposure during the Covid-19 pandemic. I spoke with the patient via phone to ensure availability of phone/video source, confirm preferred email & phone number, and discuss instructions and expectations.  I reminded Hunter Choi to  be prepared with any vital sign and/or heart rhythm information that could potentially be obtained via home monitoring, at the time of his visit. I reminded Hunter Choi to expect a phone call prior to his visit.  [name] L, LPN 08/28/9526 4:13 PM   INSTRUCTIONS FOR DOWNLOADING THE MYCHART APP TO SMARTPHONE  - The patient must first make sure to have activated MyChart and know their login information - If Apple, go to [employer] and type in MyChart in the search bar and download the app. If Android, ask patient to go to [employer] and type in  [place] in the search bar and download the app. The app is free but as with any other app downloads, their phone may require them to verify saved payment information or Apple/Android password.  - The patient will need to then log into the app with their MyChart username and password, and select [facility] as their healthcare provider to link the account. When it is time for your visit, go to the MyChart app, find appointments, and click Begin Video Visit. Be sure to Select Allow for your device to access the Microphone and Camera for your visit. You will then be connected, and your provider will be with you shortly.  **If they have any issues connecting, or need assistance please contact MyChart service desk [phone])**  **If using a computer, in order to ensure the best quality for their visit they will need to use either of the following Internet Browsers: [employer] or Google Chrome**  IF USING DOXIMITY or DOXY.ME - The patient will receive a link just prior to their visit by text.     FULL LENGTH CONSENT FOR TELE-HEALTH VISIT   I hereby voluntarily request, consent and authorize [facility] and its employed or contracted physicians, physician assistants, nurse practitioners or other licensed health care professionals (the Practitioner), to provide me with telemedicine health care services (the "Services") as deemed necessary by the treating Practitioner. I acknowledge and consent to receive the Services by the Practitioner via telemedicine. I understand that the telemedicine visit will involve communicating with the Practitioner through live audiovisual communication technology and the disclosure of certain medical information by electronic transmission. I acknowledge that I have been given the opportunity to request an in-person assessment or other available alternative prior to the telemedicine visit and am voluntarily participating in the telemedicine visit.  I  understand that I have the right to withhold or withdraw my consent to the use of telemedicine in the course of my care at any time, without affecting my right to future care or treatment, and that the Practitioner or I may terminate the telemedicine visit at any time. I understand that I have the right to inspect all information obtained and/or recorded in the course of the telemedicine visit and may receive copies of available information for a reasonable fee.  I understand that some of the potential risks of receiving the Services via telemedicine include:  Marland Kitchen Delay or interruption in medical evaluation due to technological equipment failure or disruption; . Information transmitted may not be sufficient (e.g. poor resolution of images) to allow for appropriate medical decision making by the Practitioner; and/or  . In rare instances, security protocols could fail, causing a breach of personal health information.  Furthermore, I acknowledge that it is my responsibility to provide information about my medical history, conditions and care that is complete and accurate to the best of my ability. I acknowledge that Practitioner's  advice, recommendations, and/or decision may be based on factors not within their control, such as incomplete or inaccurate data provided by me or distortions of diagnostic images or specimens that may result from electronic transmissions. I understand that the practice of medicine is not an exact science and that Practitioner makes no warranties or guarantees regarding treatment outcomes. I acknowledge that I will receive a copy of this consent concurrently upon execution via email to the email address I last provided but may also request a printed copy by calling the office of [facility].    I understand that my insurance will be billed for this visit.   I have read or had this consent read to me. . I understand the contents of this consent, which adequately explains the  benefits and risks of the Services being provided via telemedicine.  . I have been provided ample opportunity to ask questions regarding this consent and the Services and have had my questions answered to my satisfaction. . I give my informed consent for the services to be provided through the use of telemedicine in my medical care  By participating in this telemedicine visit I agree to the above.

## 2018-09-02 NOTE — Progress Notes (Signed)
Virtual Visit via Video Note   This visit type was conducted due to national recommendations for restrictions regarding the COVID-19 Pandemic (e.g. social distancing) in an effort to limit this patient's exposure and mitigate transmission in our community.  Due to his co-morbid illnesses, this patient is at least at moderate risk for complications without adequate follow up.  This format is felt to be most appropriate for this patient at this time.  All issues noted in this document were discussed and addressed.  A limited physical exam was performed with this format.  Please refer to the patient's chart for his consent to telehealth for [facility].   Date:  09/02/2018   ID:  [name], DOB 1927/04/06, MRN [id number]  Patient Location: Home Provider Location: Home  PCP:  Leeroy Cha, MD  Cardiologist:  Sinclair Grooms, MD  Electrophysiologist:  None   Evaluation Performed:  Follow-Up Visit  Chief Complaint:  CHF/CAD/AVD  History of Present Illness:    Hunter Choi is a 85 y.o. male with h/o transaortic valve replacement (TAVR 12/2012) for aortic stenosis, CAD with bypass graft occlusion, hearing loss, newly developed systolic heart failure with most recent LVEF less than 30% and chronic kidney disease.  This is a virtual phone visit with Hunter Choi.  Information and interview carried out via his son Hunter Choi as Hunter Choi himself is very hard of hearing.  According to [facility], he has had no significant episodes of chest pain, dyspnea, swelling, orthopnea, racing heart, or other cardiac complaints.  Appetite remains relatively stable.  Medications are being complied with.  The patient does not have symptoms concerning for COVID-19 infection (fever, chills, cough, or new shortness of breath).    Past Medical History:  Diagnosis Date  . AAA (abdominal aortic aneurysm) ([place])    Dr. Rosalia Hammers  . Aortic stenosis, severe    a. s/p TAVR  . Blind left eye   . BPH  (benign prostatic hypertrophy)    Dr. Diona Fanti  . CAD (coronary artery disease) of bypass graft    Chronic occlusion of saphenous vein graft to RCA  . Coronary artery disease    Left main disease and severe 3-vessel CAD  . Diastolic heart failure ([place])   . Embolism involving retinal artery   . GERD (gastroesophageal reflux disease)   . Hard of hearing   . Hypertension   . Hypothyroidism   . Iliac artery aneurysm (H[facility])    Dr. Scot Dock  . PAF (paroxysmal atrial fibrillation) ([place])   . 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 12/2012: mm Berniece Pap XT transcatheter heart valve placed via transapical approach  . Stroke [facility])    mini stroke effective his eye left   Past Surgical History:  Procedure Laterality Date  . BIOPSY PROSTATE  2005  . BUNIONECTOMY WITH HAMMERTOE RECONSTRUCTION Bilateral   . CARDIAC CATHETERIZATION    . CARDIAC CATHETERIZATION N/A 05/07/2015   Procedure: Right Heart Cath and Coronary/Graft Angiography;  Surgeon: Belva Crome, MD;  Location: [place] CV LAB;  Service: Cardiovascular;  Laterality: N/A;  . CAROTID ENDARTERECTOMY Right Jan. 2, 1997  . CATARACT EXTRACTION W/ INTRAOCULAR LENS IMPLANT Right   . CHOLECYSTECTOMY N/A 01/28/2015   Procedure: LAPAROSCOPIC CHOLECYSTECTOMY WITH INTRAOPERATIVE CHOLANGIOGRAM;  Surgeon: Greer Pickerel, MD;  Location: [place];  Service: General;  Laterality: N/A;  . COLONOSCOPY    . CORONARY ARTERY BYPASS GRAFT  01/1998   Dr. Lanelle Bal; LIMA to LAD,  SVG to D1, SVG to LCx, SVG to RCA, open saphenous vein harvest via right lower extremity  . HARVEST BONE GRAFT  1982   FOR THE  ANKLE  . HEMIARTHROPLASTY HIP Left    AFTER HIP FX  . INTRAOPERATIVE TRANSESOPHAGEAL ECHOCARDIOGRAM N/A 01/15/2013   Procedure: INTRAOPERATIVE TRANSESOPHAGEAL ECHOCARDIOGRAM;  Surgeon: [name], MD;  Location: [facility] OR;  Service: Open Heart Surgery;  Laterality: N/A;  . JOINT REPLACEMENT    . LEFT AND RIGHT HEART  CATHETERIZATION WITH CORONARY ANGIOGRAM N/A 12/20/2012   Procedure: LEFT AND RIGHT HEART CATHETERIZATION WITH CORONARY ANGIOGRAM;  Surgeon: Lesleigh Noe, MD;  Location: [facility] CATH LAB;  Service: Cardiovascular;  Laterality: N/A;  . LEFT HEART CATH AND CORS/GRAFTS ANGIOGRAPHY N/A 02/14/2017   Procedure: LEFT HEART CATH AND CORS/GRAFTS ANGIOGRAPHY;  Surgeon: Marykay Lex, MD;  Location: [facility] INVASIVE CV LAB;  Service: Cardiovascular;  Laterality: N/A;  . SKIN GRAFTS  1968  . TOTAL HIP ARTHROPLASTY Left 2007  . TOTAL KNEE ARTHROPLASTY  1992-2002   right-left  . TRANSCATHETER AORTIC VALVE REPLACEMENT, TRANSAPICAL N/A 01/15/2013   Procedure: TRANSCATHETER AORTIC VALVE REPLACEMENT, TRANSAPICAL;  Surgeon: [name], MD;  Location: [facility] OR;  Service: Open Heart Surgery;  Laterality: N/A;  . TRANSURETHRAL RESECTION OF PROSTATE  2012     No outpatient medications have been marked as taking for the 09/03/18 encounter (Appointment) with Lyn Records, MD.     Allergies:   Ciprofloxacin   Social History   Tobacco Use  . Smoking status: Former Smoker    Packs/day: 0.50    Years: 37.00    Pack years: 18.50    Types: Cigarettes    Last attempt to quit: 03/29/1963    Years since quitting: 55.4  . Smokeless tobacco: Former Neurosurgeon    Types: Chew  Substance Use Topics  . Alcohol use: No  . Drug use: No     Family Hx: The patient's family history includes Heart attack in his brother, father, and mother; Heart disease in his brother, father, and mother; Hyperlipidemia in his brother, father, and mother; Hypertension in his brother, father, mother, and son.  ROS:   Please see the history of present illness.    The patient is very hard of hearing.  He was unable to have a phone conversation because of his hearing and he has rheumatoid arthritis which prevents him from easily holding the phone.  His weight is been stable.  He sleeps well.  He is forgetful according to his son. All other systems  reviewed and are negative.   Prior CV studies:   The following studies were reviewed today:  2D Doppler echocardiogram October 2019: Study Conclusions  - Left ventricle: The cavity size was normal. Wall thickness was   increased in a pattern of moderate LVH. Systolic function was   moderately to severely reduced. The estimated ejection fraction   was in the range of 30% to 35%. Diffuse hypokinesis with regional   variation. The study is not technically sufficient to allow   evaluation of LV diastolic function. - Aortic valve: s/p TAVR. No obstruction. Trivial perivalvular   leak. Mean gradient (S): 6 mm Hg. Peak gradient (S): 13 mm Hg.   Valve area (VTI): 1.7 cm^2. Valve area (Vmax): 1.56 cm^2. Valve   area (Vmean): 1.54 cm^2. - Mitral valve: Mildly thickened leaflets . There was moderate   regurgitation. - Left atrium: Severely dilated. - Right ventricle: The cavity size was mildly dilated.  Mildly   reduced systolic function. - Right atrium: The atrium was mildly dilated.  Impressions:  - Compared to a prior study in 2018, the LVEF is lower at 30-35%.  Labs/Other Tests and Data Reviewed:    EKG:  No ECG reviewed.  Recent Labs: 05/12/2018: B Natriuretic Peptide >4,500.0; TSH 15.166 05/16/2018: ALT 12; BUN 33; Creatinine, Ser 1.25; Hemoglobin 9.6; Magnesium 2.3; Platelets 253; Potassium 3.8; Sodium 138   Recent Lipid Panel Lab Results  Component Value Date/Time   CHOL 84 02/21/2014 02:10 AM   TRIG 93 02/21/2014 02:10 AM   HDL 27 (L) 02/21/2014 02:10 AM   CHOLHDL 3.1 02/21/2014 02:10 AM   LDLCALC 38 02/21/2014 02:10 AM    Wt Readings from Last 3 Encounters:  05/29/18 143 lb 12.8 oz (65.2 kg)  05/17/18 135 lb 5.8 oz (61.4 kg)  04/30/18 153 lb 14.1 oz (69.8 kg)     Objective:    Vital Signs:  There were no vitals taken for this visit.   VITAL SIGNS:  reviewed GEN:  no acute distress RESPIRATORY:  normal respiratory effort, symmetric expansion CARDIOVASCULAR:   no peripheral edema NEURO:  alert and oriented x 3, no obvious focal deficit  ASSESSMENT & PLAN:    1. S/P TAVR (transcatheter aortic valve replacement)   2. Coronary artery disease involving coronary bypass graft of native heart with angina pectoris (HCC)   3. Essential hypertension   4. Chronic combined systolic and diastolic CHF (congestive heart failure) (HCC)   5. Stage 3 chronic kidney disease (HCC)   6. Educated About Covid-19 Virus Infection    PLAN:  1. Status post aortic valve replacement, percutaneously.  Currently stable.  I see no need for repeat imaging at this time. 2. Asymptomatic relative to angina. 3. Blood pressure target less than 130/80 mmHg. 4. EF is less than 40%.  He is on heart failure therapy that includes carvedilol, furosemide, and low-dose losartan. 5. We did not address his kidney function today.  The most recent laboratory data is from mid February 2021 creatinine was 1.25.    COVID-19 Education: The signs and symptoms of COVID-19 were discussed with the patient and how to seek care for testing (follow up with PCP or arrange E-visit).  The importance of social distancing was discussed today.  Time:   Today, I have spent 15 minutes with the patient with telehealth technology discussing the above problems.     Medication Adjustments/Labs and Tests Ordered: Current medicines are reviewed at length with the patient today.  Concerns regarding medicines are outlined above.   Tests Ordered: No orders of the defined types were placed in this encounter.   Medication Changes: No orders of the defined types were placed in this encounter.   Disposition:  Follow up in 7 month(s)  Signed, Lesleigh Noe, MD  09/02/2018 9:37 AM    [facility]

## 2018-09-03 ENCOUNTER — Encounter: Payer: Self-pay | Admitting: Interventional Cardiology

## 2018-09-03 ENCOUNTER — Other Ambulatory Visit: Payer: Self-pay

## 2018-09-03 ENCOUNTER — Telehealth (INDEPENDENT_AMBULATORY_CARE_PROVIDER_SITE_OTHER): Payer: Medicare Other | Admitting: Interventional Cardiology

## 2018-09-03 VITALS — BP 115/60 | Ht 67.0 in | Wt 148.0 lb

## 2018-09-03 DIAGNOSIS — I10 Essential (primary) hypertension: Secondary | ICD-10-CM

## 2018-09-03 DIAGNOSIS — I25709 Atherosclerosis of coronary artery bypass graft(s), unspecified, with unspecified angina pectoris: Secondary | ICD-10-CM

## 2018-09-03 DIAGNOSIS — Z952 Presence of prosthetic heart valve: Secondary | ICD-10-CM | POA: Diagnosis not present

## 2018-09-03 DIAGNOSIS — N183 Chronic kidney disease, stage 3 unspecified: Secondary | ICD-10-CM

## 2018-09-03 DIAGNOSIS — Z7189 Other specified counseling: Secondary | ICD-10-CM

## 2018-09-03 DIAGNOSIS — I5042 Chronic combined systolic (congestive) and diastolic (congestive) heart failure: Secondary | ICD-10-CM

## 2018-09-03 NOTE — Patient Instructions (Signed)
Medication Instructions:  Your physician recommends that you continue on your current medications as directed. Please refer to the Current Medication list given to you today.  If you need a refill on your cardiac medications before your next appointment, please call your pharmacy.   Lab work: None If you have labs (blood work) drawn today and your tests are completely normal, you will receive your results only by: . MyChart Message (if you have MyChart) OR . A paper copy in the mail If you have any lab test that is abnormal or we need to change your treatment, we will call you to review the results.  Testing/Procedures: None  Follow-Up: At [facility], you and your health needs are our priority.  As part of our continuing mission to provide you with exceptional heart care, we have created designated Provider Care Teams.  These Care Teams include your primary Cardiologist (physician) and Advanced Practice Providers (APPs -  Physician Assistants and Nurse Practitioners) who all work together to provide you with the care you need, when you need it. You will need a follow up appointment in 9 months.  Please call our office 2 months in advance to schedule this appointment.  You may see Henry W Smith III, MD or one of the following Advanced Practice Providers on your designated Care Team:   Lori Gerhardt, NP Laura Ingold, NP . Jill McDaniel, NP  Any Other Special Instructions Will Be Listed Below (If Applicable).    

## 2018-11-11 ENCOUNTER — Other Ambulatory Visit: Payer: Self-pay | Admitting: Cardiology

## 2019-01-13 ENCOUNTER — Other Ambulatory Visit: Payer: Self-pay | Admitting: Interventional Cardiology

## 2019-02-18 ENCOUNTER — Other Ambulatory Visit: Payer: Self-pay

## 2019-02-18 ENCOUNTER — Encounter (HOSPITAL_COMMUNITY): Payer: Self-pay | Admitting: Emergency Medicine

## 2019-02-18 ENCOUNTER — Emergency Department (HOSPITAL_COMMUNITY): Payer: Medicare Other

## 2019-02-18 ENCOUNTER — Inpatient Hospital Stay (HOSPITAL_COMMUNITY)
Admission: EM | Admit: 2019-02-18 | Discharge: 2019-02-22 | DRG: 291 | Disposition: A | Discharge status: Home / Self Care | Payer: Medicare Other | Attending: Internal Medicine | Admitting: Internal Medicine

## 2019-02-18 DIAGNOSIS — G9341 Metabolic encephalopathy: Secondary | ICD-10-CM | POA: Diagnosis present

## 2019-02-18 DIAGNOSIS — R0602 Shortness of breath: Secondary | ICD-10-CM | POA: Diagnosis not present

## 2019-02-18 DIAGNOSIS — K219 Gastro-esophageal reflux disease without esophagitis: Secondary | ICD-10-CM | POA: Diagnosis present

## 2019-02-18 DIAGNOSIS — Z96642 Presence of left artificial hip joint: Secondary | ICD-10-CM | POA: Diagnosis present

## 2019-02-18 DIAGNOSIS — I447 Left bundle-branch block, unspecified: Secondary | ICD-10-CM | POA: Diagnosis present

## 2019-02-18 DIAGNOSIS — Z87891 Personal history of nicotine dependence: Secondary | ICD-10-CM

## 2019-02-18 DIAGNOSIS — Z8249 Family history of ischemic heart disease and other diseases of the circulatory system: Secondary | ICD-10-CM

## 2019-02-18 DIAGNOSIS — E039 Hypothyroidism, unspecified: Secondary | ICD-10-CM | POA: Diagnosis present

## 2019-02-18 DIAGNOSIS — I25810 Atherosclerosis of coronary artery bypass graft(s) without angina pectoris: Secondary | ICD-10-CM | POA: Diagnosis present

## 2019-02-18 DIAGNOSIS — I714 Abdominal aortic aneurysm, without rupture: Secondary | ICD-10-CM | POA: Diagnosis present

## 2019-02-18 DIAGNOSIS — I5043 Acute on chronic combined systolic (congestive) and diastolic (congestive) heart failure: Secondary | ICD-10-CM | POA: Diagnosis present

## 2019-02-18 DIAGNOSIS — Z23 Encounter for immunization: Secondary | ICD-10-CM

## 2019-02-18 DIAGNOSIS — Z7902 Long term (current) use of antithrombotics/antiplatelets: Secondary | ICD-10-CM

## 2019-02-18 DIAGNOSIS — Z9049 Acquired absence of other specified parts of digestive tract: Secondary | ICD-10-CM

## 2019-02-18 DIAGNOSIS — Z96653 Presence of artificial knee joint, bilateral: Secondary | ICD-10-CM | POA: Diagnosis present

## 2019-02-18 DIAGNOSIS — N183 Chronic kidney disease, stage 3 unspecified: Secondary | ICD-10-CM | POA: Diagnosis present

## 2019-02-18 DIAGNOSIS — I130 Hypertensive heart and chronic kidney disease with heart failure and stage 1 through stage 4 chronic kidney disease, or unspecified chronic kidney disease: Principal | ICD-10-CM | POA: Diagnosis present

## 2019-02-18 DIAGNOSIS — M069 Rheumatoid arthritis, unspecified: Secondary | ICD-10-CM | POA: Diagnosis present

## 2019-02-18 DIAGNOSIS — Z953 Presence of xenogenic heart valve: Secondary | ICD-10-CM

## 2019-02-18 DIAGNOSIS — Z8673 Personal history of transient ischemic attack (TIA), and cerebral infarction without residual deficits: Secondary | ICD-10-CM

## 2019-02-18 DIAGNOSIS — I723 Aneurysm of iliac artery: Secondary | ICD-10-CM | POA: Diagnosis present

## 2019-02-18 DIAGNOSIS — Z7952 Long term (current) use of systemic steroids: Secondary | ICD-10-CM

## 2019-02-18 DIAGNOSIS — Z8349 Family history of other endocrine, nutritional and metabolic diseases: Secondary | ICD-10-CM

## 2019-02-18 DIAGNOSIS — N1832 Chronic kidney disease, stage 3b: Secondary | ICD-10-CM | POA: Diagnosis present

## 2019-02-18 DIAGNOSIS — Z7982 Long term (current) use of aspirin: Secondary | ICD-10-CM

## 2019-02-18 DIAGNOSIS — Z79890 Hormone replacement therapy (postmenopausal): Secondary | ICD-10-CM

## 2019-02-18 DIAGNOSIS — F0390 Unspecified dementia without behavioral disturbance: Secondary | ICD-10-CM | POA: Diagnosis present

## 2019-02-18 DIAGNOSIS — I2510 Atherosclerotic heart disease of native coronary artery without angina pectoris: Secondary | ICD-10-CM | POA: Diagnosis present

## 2019-02-18 DIAGNOSIS — H5462 Unqualified visual loss, left eye, normal vision right eye: Secondary | ICD-10-CM | POA: Diagnosis present

## 2019-02-18 DIAGNOSIS — H9190 Unspecified hearing loss, unspecified ear: Secondary | ICD-10-CM | POA: Diagnosis present

## 2019-02-18 DIAGNOSIS — I480 Paroxysmal atrial fibrillation: Secondary | ICD-10-CM | POA: Diagnosis present

## 2019-02-18 DIAGNOSIS — Z20828 Contact with and (suspected) exposure to other viral communicable diseases: Secondary | ICD-10-CM | POA: Diagnosis present

## 2019-02-18 DIAGNOSIS — Z79899 Other long term (current) drug therapy: Secondary | ICD-10-CM

## 2019-02-18 DIAGNOSIS — Z888 Allergy status to other drugs, medicaments and biological substances status: Secondary | ICD-10-CM

## 2019-02-18 DIAGNOSIS — N400 Enlarged prostate without lower urinary tract symptoms: Secondary | ICD-10-CM | POA: Diagnosis present

## 2019-02-18 DIAGNOSIS — F05 Delirium due to known physiological condition: Secondary | ICD-10-CM | POA: Diagnosis present

## 2019-02-18 LAB — BASIC METABOLIC PANEL
Anion gap: 10 (ref 5–15)
BUN: 50 mg/dL — ABNORMAL HIGH (ref 8–23)
CO2: 24 mmol/L (ref 22–32)
Calcium: 9 mg/dL (ref 8.9–10.3)
Chloride: 100 mmol/L (ref 98–111)
Creatinine, Ser: 1.63 mg/dL — ABNORMAL HIGH (ref 0.61–1.24)
GFR calc Af Amer: 43 mL/min — ABNORMAL LOW (ref >60)
GFR calc non Af Amer: 37 mL/min — ABNORMAL LOW (ref >60)
Glucose, Bld: 137 mg/dL — ABNORMAL HIGH (ref 70–99)
Potassium: 4.7 mmol/L (ref 3.5–5.1)
Sodium: 134 mmol/L — ABNORMAL LOW (ref 135–145)

## 2019-02-18 LAB — URINALYSIS, ROUTINE W REFLEX MICROSCOPIC
Bacteria, UA: NONE SEEN
Bilirubin Urine: NEGATIVE
Glucose, UA: NEGATIVE mg/dL
Ketones, ur: NEGATIVE mg/dL
Leukocytes,Ua: NEGATIVE
Nitrite: NEGATIVE
Protein, ur: 30 mg/dL — AB
Specific Gravity, Urine: 1.01 (ref 1.005–1.030)
pH: 5 (ref 5.0–8.0)

## 2019-02-18 LAB — CBC
HCT: 32.2 % — ABNORMAL LOW (ref 39.0–52.0)
Hemoglobin: 9.7 g/dL — ABNORMAL LOW (ref 13.0–17.0)
MCH: 27.1 pg (ref 26.0–34.0)
MCHC: 30.1 g/dL (ref 30.0–36.0)
MCV: 89.9 fL (ref 80.0–100.0)
Platelets: 147 10*3/uL — ABNORMAL LOW (ref 150–400)
RBC: 3.58 MIL/uL — ABNORMAL LOW (ref 4.22–5.81)
RDW: 17.3 % — ABNORMAL HIGH (ref 11.5–15.5)
WBC: 10.8 10*3/uL — ABNORMAL HIGH (ref 4.0–10.5)
nRBC: 0 % (ref 0.0–0.2)

## 2019-02-18 LAB — BRAIN NATRIURETIC PEPTIDE: B Natriuretic Peptide: 2601.2 pg/mL — ABNORMAL HIGH (ref 0.0–100.0)

## 2019-02-18 NOTE — ED Triage Notes (Signed)
Family reports the pt has become weaker over the past few days. Endorses decreased appetite, increased swelling and some confsusion. Family reports increased SOB and hx of CHF.

## 2019-02-18 NOTE — ED Provider Notes (Signed)
[facility] EMERGENCY DEPARTMENT Provider Note   CSN: [id number] Arrival date & time: 02/18/19  1620     History   Chief Complaint Chief Complaint  Patient presents with  . Weakness    HPI Hunter Choi is a 85 y.o. male PMH significant for CAD, s/p TAVR, CHF, AAA, and CVA who presents to the ED with 3-day history of worsening shortness of breath symptoms and diminished appetite.  Patient is accompanied by his youngest son, Franki Stemen, who is his full-time caretaker.  [name] reports that patient began having increased work of breathing 3 days ago accompanied by a 4 to 5 pound increase in weight.  He also endorses diminished appetite and p.o. intake.  Patient lives with his two sons and nobody else has been sick in the home.  Genelle Bal has doubled up on patient's furosemide, but with little effect.  He reports that he would not of brought his father into the ED today if he was not particularly concerned.  [name] reports that his son is not nearly as altered as he was when they presented to the ED for similar symptoms in February, but feels as though they are trending in that direction.  Patient typically is able to ambulate with a walker, but was too weak to do so today and is now requiring wheelchair.  Patient denies any headache or dizziness, fevers or chills, chest pain, abdominal pain, nausea or vomiting, urinary symptoms, change in bowel habits, melena, or other symptoms.  He is tremendously hard of hearing, but able to answer questions appropriately and follow directions.     HPI  Past Medical History:  Diagnosis Date  . AAA (abdominal aortic aneurysm) (HCC)    Dr. Ashley Royalty  . Aortic stenosis, severe    a. s/p TAVR  . Blind left eye   . BPH (benign prostatic hypertrophy)    Dr. Retta Diones  . CAD (coronary artery disease) of bypass graft    Chronic occlusion of saphenous vein graft to RCA  . Coronary artery disease    Left main disease and severe 3-vessel CAD  .  Diastolic heart failure (HCC)   . Embolism involving retinal artery   . GERD (gastroesophageal reflux disease)   . Hard of hearing   . Hypertension   . Hypothyroidism   . Iliac artery aneurysm (HCC)    Dr. Edilia Bo  . PAF (paroxysmal atrial fibrillation) (HCC)   . 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 12/2012: mm Stephannie Peters XT transcatheter heart valve placed via transapical approach  . Stroke [facility])    mini stroke effective his eye left    Patient Active Problem List   Diagnosis Date Noted  . Acute exacerbation of CHF (congestive heart failure) (HCC) 05/12/2018  . Nausea vomiting and diarrhea 04/22/2018  . B12 deficiency 03/27/2018  . Hypothyroidism 03/27/2018  . Chronic combined systolic and diastolic CHF (congestive heart failure) (HCC) 03/27/2018  . Protein-calorie malnutrition, severe 03/24/2018  . Generalized weakness   . Acute metabolic encephalopathy 03/22/2018  . CKD (chronic kidney disease) stage 3, GFR 30-59 ml/min 12/09/2017  . SIRS (systemic inflammatory response syndrome) (HCC) 08/03/2017  . Essential hypertension 08/03/2017  . Elevated troponin   . Sepsis due to urinary tract infection (HCC) 10/02/2015  . Hard of hearing   . Pressure ulcer 01/28/2015  . Acute on chronic combined systolic and diastolic congestive heart failure, NYHA class 4 (HCC) 01/27/2015  . Hyponatremia 11/21/2014  . Elevated LFTs  11/14/2014  . Anemia 05/23/2014  . Thrombocytopenia ([place]) 02/21/2014  . Malnutrition of moderate degree ([place]) 02/21/2014  . Occlusion and stenosis of carotid artery without mention of cerebral infarction 09/04/2013  . Aftercare following surgery of the circulatory system, [place] 09/04/2013  . AAA (abdominal aortic aneurysm) without rupture ([place]) 03/06/2013  . 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 Coronary artery disease involving coronary bypass graft of native heart with angina pectoris ([place])   . GERD  (gastroesophageal reflux disease)   . Thyroid disease   . Embolism involving retinal artery   . Iliac artery aneurysm ([place])   . Rheumatoid arthritis ([place]) 04/22/2010    Past Surgical History:  Procedure Laterality Date  . BIOPSY PROSTATE  2005  . BUNIONECTOMY WITH HAMMERTOE RECONSTRUCTION Bilateral   . CARDIAC CATHETERIZATION    . CARDIAC CATHETERIZATION N/A 05/07/2015   Procedure: Right Heart Cath and Coronary/Graft Angiography;  Surgeon: Belva Crome, MD;  Location: [place] CV LAB;  Service: Cardiovascular;  Laterality: N/A;  . CAROTID ENDARTERECTOMY Right Jan. 2, 1997  . CATARACT EXTRACTION W/ INTRAOCULAR LENS IMPLANT Right   . CHOLECYSTECTOMY N/A 01/28/2015   Procedure: LAPAROSCOPIC CHOLECYSTECTOMY WITH INTRAOPERATIVE CHOLANGIOGRAM;  Surgeon: Greer Pickerel, MD;  Location: [place];  Service: General;  Laterality: N/A;  . COLONOSCOPY    . CORONARY ARTERY BYPASS GRAFT  01/1998   Dr. Lanelle Bal; LIMA to LAD, SVG to D1, SVG to LCx, SVG to RCA, open saphenous vein harvest via right lower extremity  . HARVEST BONE GRAFT  1982   FOR THE  ANKLE  . HEMIARTHROPLASTY HIP Left    AFTER HIP FX  . INTRAOPERATIVE TRANSESOPHAGEAL ECHOCARDIOGRAM N/A 01/15/2013   Procedure: INTRAOPERATIVE TRANSESOPHAGEAL ECHOCARDIOGRAM;  Surgeon: Gaye Pollack, MD;  Location: [facility] OR;  Service: Open Heart Surgery;  Laterality: N/A;  . JOINT REPLACEMENT    . LEFT AND RIGHT HEART CATHETERIZATION WITH CORONARY ANGIOGRAM N/A 12/20/2012   Procedure: LEFT AND RIGHT HEART CATHETERIZATION WITH CORONARY ANGIOGRAM;  Surgeon: Sinclair Grooms, MD;  Location: [facility] CATH LAB;  Service: Cardiovascular;  Laterality: N/A;  . LEFT HEART CATH AND CORS/GRAFTS ANGIOGRAPHY N/A 02/14/2017   Procedure: LEFT HEART CATH AND CORS/GRAFTS ANGIOGRAPHY;  Surgeon: Leonie Man, MD;  Location: [place] CV LAB;  Service: Cardiovascular;  Laterality: N/A;  . SKIN GRAFTS  1968  . TOTAL HIP ARTHROPLASTY Left 2007  . TOTAL KNEE ARTHROPLASTY   1992-2002   right-left  . TRANSCATHETER AORTIC VALVE REPLACEMENT, TRANSAPICAL N/A 01/15/2013   Procedure: TRANSCATHETER AORTIC VALVE REPLACEMENT, TRANSAPICAL;  Surgeon: Gaye Pollack, MD;  Location: [place] OR;  Service: Open Heart Surgery;  Laterality: N/A;  . TRANSURETHRAL RESECTION OF PROSTATE  2012        Home Medications    Prior to Admission medications   Medication Sig Start Date End Date Taking? Authorizing Provider  acetaminophen (TYLENOL) 500 MG tablet Take 1,000 mg by mouth 2 (two) times daily.   Yes [provider]  amLODipine (NORVASC) 5 MG tablet Take 5 mg by mouth daily.   Yes [provider]  aspirin EC 81 MG EC tablet Take 1 tablet (81 mg total) by mouth daily. 01/21/13  Yes Lars Pinks M, PA-C  atorvastatin (LIPITOR) 20 MG tablet Take 20 mg by mouth daily.   Yes [provider]  carvedilol (COREG) 6.25 MG tablet Take 1 tablet (6.25 mg total) by mouth 2 (two) times daily with a meal. 10/21/15  Yes Belva Crome, MD  clopidogrel (PLAVIX) 75 MG tablet TAKE 1 TABLET BY MOUTH EVERY DAY Patient taking differently: Take 75 mg by mouth daily.  11/12/18  Yes Leone BrandIngold, Laura R, NP  feeding supplement, ENSURE ENLIVE, (ENSURE ENLIVE) LIQD Take 237 mLs by mouth 3 (three) times daily between meals. 03/27/18  Yes Dhungel, Nishant, MD  folic acid (FOLVITE) 1 MG tablet Take 3 mg by mouth daily.   Yes [provider]  furosemide (LASIX) 20 MG tablet TAKE 2 TABLETS BY MOUTH EVERY DAY Patient taking differently: Take 40 mg by mouth daily.  01/15/19  Yes Lyn RecordsSmith, Henry W, MD  levothyroxine (SYNTHROID, LEVOTHROID) 100 MCG tablet Take 100 mcg by mouth daily before breakfast. 05/09/18  Yes [provider]  losartan (COZAAR) 25 MG tablet Take 25 mg by mouth daily. 03/19/18  Yes [provider]  methotrexate 2.5 MG tablet Take 25 mg by mouth every Friday.    Yes [provider]  Multiple Vitamin (MULTIVITAMIN WITH MINERALS) TABS tablet  Take 1 tablet by mouth daily. 03/27/18  Yes Dhungel, Nishant, MD  predniSONE (DELTASONE) 5 MG tablet Take 5 mg by mouth daily. 04/07/15  Yes [provider]  vitamin B-12 (CYANOCOBALAMIN) 1000 MCG tablet Take 1 tablet (1,000 mcg total) by mouth daily. 03/27/18  Yes Dhungel, Nishant, MD  Calcium Carbonate Antacid (TUMS PO) Take 1 tablet by mouth 2 (two) times daily as needed (heartburn/indigestion).    [provider]  fluticasone (FLONASE) 50 MCG/ACT nasal spray Place 1 spray into both nostrils at bedtime as needed for allergies or rhinitis.    [provider]  nitroGLYCERIN (NITROSTAT) 0.4 MG SL tablet Place 0.4 mg under the tongue every 5 (five) minutes as needed for chest pain.    [provider]    Family History Family History  Problem Relation Age of Onset  . Heart attack Mother   . Heart disease Mother        Before age 83  . Hyperlipidemia Mother   . Hypertension Mother   . Hypertension Son   . Heart disease Father        AAA-   . Hyperlipidemia Father   . Heart attack Father   . Hypertension Father   . Heart disease Brother        Before age 83  . Hyperlipidemia Brother   . Heart attack Brother   . Hypertension Brother     Social History Social History   Tobacco Use  . Smoking status: Former Smoker    Packs/day: 0.50    Years: 37.00    Pack years: 18.50    Types: Cigarettes    Quit date: 03/29/1963    Years since quitting: 55.9  . Smokeless tobacco: Former NeurosurgeonUser    Types: Chew  Substance Use Topics  . Alcohol use: No  . Drug use: No     Allergies   Ciprofloxacin   Review of Systems Review of Systems  All other systems reviewed and are negative.    Physical Exam Updated Vital Signs BP (!) 136/58   Pulse 65   Temp 98.1 F (36.7 C) (Oral)   Resp 20   SpO2 100%   Physical Exam Vitals signs and nursing note reviewed. Exam conducted with a chaperone present.  Constitutional:      Appearance: Normal appearance.   HENT:     Head: Normocephalic and atraumatic.     Mouth/Throat:     Pharynx: Oropharynx is clear.     Comments: Symmetric smile. Eyes:  General: No scleral icterus.    Conjunctiva/sclera: Conjunctivae normal.  Neck:     Musculoskeletal: Normal range of motion and neck supple.  Cardiovascular:     Rate and Rhythm: Normal rate and regular rhythm.     Pulses: Normal pulses.     Heart sounds: Normal heart sounds.  Pulmonary:     Comments: Mildly increased work of breathing, but not tachypneic.  No accessory muscle use.  No significant breath sounds auscultated bilaterally.  Mild expiratory wheeze. Abdominal:     Comments: Soft, nondistended.  No tenderness to palpation.  No guarding.  No overlying skin changes.  Large known hernia in mid right abdomen.  Musculoskeletal:     Comments: Mild pitting edema bilaterally.  Skin:    General: Skin is dry.  Neurological:     Mental Status: He is alert.     GCS: GCS eye subscore is 4. GCS verbal subscore is 5. GCS motor subscore is 6.     Comments: CN II through XII grossly intact.  Able to move all extremities.  Sensation intact diffusely.  Smiles symmetrically.  Psychiatric:        Mood and Affect: Mood normal.        Behavior: Behavior normal.        Thought Content: Thought content normal.      ED Treatments / Results  Labs (all labs ordered are listed, but only abnormal results are displayed) Labs Reviewed  BASIC METABOLIC PANEL - Abnormal; Notable for the following components:      Result Value   Sodium 134 (*)    Glucose, Bld 137 (*)    BUN 50 (*)    Creatinine, Ser 1.63 (*)    GFR calc non Af Amer 37 (*)    GFR calc Af Amer 43 (*)    All other components within normal limits  CBC - Abnormal; Notable for the following components:   WBC 10.8 (*)    RBC 3.58 (*)    Hemoglobin 9.7 (*)    HCT 32.2 (*)    RDW 17.3 (*)    Platelets 147 (*)    All other components within normal limits  URINALYSIS, ROUTINE W REFLEX  MICROSCOPIC - Abnormal; Notable for the following components:   Hgb urine dipstick LARGE (*)    Protein, ur 30 (*)    All other components within normal limits  BRAIN NATRIURETIC PEPTIDE - Abnormal; Notable for the following components:   B Natriuretic Peptide 2,601.2 (*)    All other components within normal limits  SARS CORONAVIRUS 2 (TAT 6-24 HRS)    EKG EKG Interpretation  Date/Time:  Monday February 18 2019 16:33:02 EST Ventricular Rate:  66 PR Interval:  318 QRS Duration: 138 QT Interval:  452 QTC Calculation: 473 R Axis:   30 Text Interpretation: Sinus rhythm with 1st degree A-V block Left bundle branch block Abnormal ECG Confirmed by Raeford Razor [phone]) on 02/18/2019 8:00:09 PM   Radiology Dg Chest Portable 1 View  Result Date: 02/18/2019 CLINICAL DATA:  Weakness, dyspnea, CHF EXAM: PORTABLE CHEST 1 VIEW COMPARISON:  05/15/2018 chest radiograph. FINDINGS: Intact sternotomy wires. CABG clips overlie the mediastinum. Aortic valve prosthesis in place. Stable cardiomediastinal silhouette with mild cardiomegaly. No pneumothorax. Slight blunting of the right costophrenic angle. No left pleural effusion. No overt pulmonary edema. No acute consolidative airspace disease. Faint reticular opacities at the lung bases appear unchanged. Surgical clips again noted in medial lower right neck. IMPRESSION: 1. Stable cardiomegaly without overt  pulmonary edema. 2. Slight blunting of the right costophrenic angle, cannot exclude trace right pleural effusion. 3. Chronic faint reticular opacities at the lung bases, suggesting nonspecific mild pulmonary fibrosis. Electronically Signed   By: Delbert PhenixJason A Poff M.D.   On: 02/18/2019 20:05    Procedures Procedures (including critical care time)  Medications Ordered in ED Medications - No data to display   Initial Impression / Assessment and Plan / ED Course  I have reviewed the triage vital signs and the nursing notes.  Pertinent labs & imaging  results that were available during my care of the patient were reviewed by me and considered in my medical decision making (see chart for details).        I personally reviewed patient's past medical record and repeat echocardiogram obtained on 05/13/2018 demonstrated a stable, but moderately to severely reduced left ventricular ejection fraction of 30 to 35%.  Patient seems alert and oriented today and is not appearing particularly confused.  However, he does appear to have a mildly increased work of breathing despite oxygenating well on room air.  His vital signs and EKG are overall reassuring.  DG chest obtained demonstrated trace right-sided pleural effusion, but no significant fluid overload.  He also has mild pitting edema bilaterally and patient reports recent weight gain and diminished appetite.  Son Nida BoatmanBrad reports normal bowel habits, though.  No obvious constipation or melena.  CBC demonstrates mild leukocytosis to 10.8.  His hemoglobin is in line with his baseline.  BMP demonstrates mildly worsened renal function with elevated BUN suggestive of possible dehydration and resulting prerenal azotemia.  Son endorses diminished oral hydration.  Hesitant to provide 1 L bolus normal saline given BNP elevated to 2600 despite doubled furosemide dose at home.  Elevated BNP not particularly unusual for him given his CHF, but suspect he may be having CHF exacerbation and may require IV diuretics.  COVID-19 testing pending.  UA finally obtained after in-and-out catheter demonstrated no bacteria or WBCs concerning for infection.  Given that he typically ambulates at home with walker without supplemental O2, will ambulate with walker here using pulse oximetry.      Patient has become increasingly confused during his stay in the ED.  On reexamination, he was having conversations with himself while resting which son states is highly unusual for him and only occurs when he is sick.  He was able to ambulate with a  walker just 2 days ago and now he refuses to get out of bed for nursing staff or even when directed by his son.  Patient does have a new mild leukocytosis and elevated BNP concerning for CHF exacerbation.  With new onset weakness and confusion, will consult hospitalist to request admission.  12:39 AM Spoke with [facility] group, Dr. Earlene PlaterWallace, who will see patient regarding admission.     Final Clinical Impressions(s) / ED Diagnoses   Final diagnoses:  Shortness of breath    ED Discharge Orders    None       Elvera MariaGreen, Bonna Steury L, PA-C 02/19/19 0039    Raeford RazorKohut, Stephen, MD 02/22/19 2025

## 2019-02-19 ENCOUNTER — Other Ambulatory Visit: Payer: Self-pay

## 2019-02-19 ENCOUNTER — Encounter (HOSPITAL_COMMUNITY): Payer: Self-pay | Admitting: *Deleted

## 2019-02-19 ENCOUNTER — Inpatient Hospital Stay (HOSPITAL_COMMUNITY): Payer: Medicare Other

## 2019-02-19 DIAGNOSIS — R0602 Shortness of breath: Secondary | ICD-10-CM | POA: Diagnosis present

## 2019-02-19 DIAGNOSIS — I5043 Acute on chronic combined systolic (congestive) and diastolic (congestive) heart failure: Secondary | ICD-10-CM | POA: Diagnosis not present

## 2019-02-19 DIAGNOSIS — N1832 Chronic kidney disease, stage 3b: Secondary | ICD-10-CM | POA: Diagnosis present

## 2019-02-19 DIAGNOSIS — E039 Hypothyroidism, unspecified: Secondary | ICD-10-CM | POA: Diagnosis present

## 2019-02-19 DIAGNOSIS — G9341 Metabolic encephalopathy: Secondary | ICD-10-CM

## 2019-02-19 DIAGNOSIS — H9190 Unspecified hearing loss, unspecified ear: Secondary | ICD-10-CM | POA: Diagnosis present

## 2019-02-19 DIAGNOSIS — H5462 Unqualified visual loss, left eye, normal vision right eye: Secondary | ICD-10-CM | POA: Diagnosis present

## 2019-02-19 DIAGNOSIS — K219 Gastro-esophageal reflux disease without esophagitis: Secondary | ICD-10-CM | POA: Diagnosis present

## 2019-02-19 DIAGNOSIS — Z23 Encounter for immunization: Secondary | ICD-10-CM | POA: Diagnosis present

## 2019-02-19 DIAGNOSIS — I480 Paroxysmal atrial fibrillation: Secondary | ICD-10-CM | POA: Diagnosis present

## 2019-02-19 DIAGNOSIS — I714 Abdominal aortic aneurysm, without rupture: Secondary | ICD-10-CM | POA: Diagnosis present

## 2019-02-19 DIAGNOSIS — M069 Rheumatoid arthritis, unspecified: Secondary | ICD-10-CM | POA: Diagnosis present

## 2019-02-19 DIAGNOSIS — F05 Delirium due to known physiological condition: Secondary | ICD-10-CM | POA: Diagnosis present

## 2019-02-19 DIAGNOSIS — I723 Aneurysm of iliac artery: Secondary | ICD-10-CM | POA: Diagnosis present

## 2019-02-19 DIAGNOSIS — Z20828 Contact with and (suspected) exposure to other viral communicable diseases: Secondary | ICD-10-CM | POA: Diagnosis present

## 2019-02-19 DIAGNOSIS — Z96653 Presence of artificial knee joint, bilateral: Secondary | ICD-10-CM | POA: Diagnosis present

## 2019-02-19 DIAGNOSIS — F0390 Unspecified dementia without behavioral disturbance: Secondary | ICD-10-CM | POA: Diagnosis present

## 2019-02-19 DIAGNOSIS — Z96642 Presence of left artificial hip joint: Secondary | ICD-10-CM | POA: Diagnosis present

## 2019-02-19 DIAGNOSIS — I25810 Atherosclerosis of coronary artery bypass graft(s) without angina pectoris: Secondary | ICD-10-CM | POA: Diagnosis present

## 2019-02-19 DIAGNOSIS — Z888 Allergy status to other drugs, medicaments and biological substances status: Secondary | ICD-10-CM | POA: Diagnosis not present

## 2019-02-19 DIAGNOSIS — I2510 Atherosclerotic heart disease of native coronary artery without angina pectoris: Secondary | ICD-10-CM | POA: Diagnosis present

## 2019-02-19 DIAGNOSIS — I130 Hypertensive heart and chronic kidney disease with heart failure and stage 1 through stage 4 chronic kidney disease, or unspecified chronic kidney disease: Secondary | ICD-10-CM | POA: Diagnosis present

## 2019-02-19 DIAGNOSIS — Z953 Presence of xenogenic heart valve: Secondary | ICD-10-CM | POA: Diagnosis not present

## 2019-02-19 DIAGNOSIS — Z8673 Personal history of transient ischemic attack (TIA), and cerebral infarction without residual deficits: Secondary | ICD-10-CM | POA: Diagnosis not present

## 2019-02-19 DIAGNOSIS — Z9049 Acquired absence of other specified parts of digestive tract: Secondary | ICD-10-CM | POA: Diagnosis not present

## 2019-02-19 LAB — ECHOCARDIOGRAM COMPLETE
Height: 67 in
Weight: 2444.46 oz

## 2019-02-19 LAB — SARS CORONAVIRUS 2 (TAT 6-24 HRS): SARS Coronavirus 2: NEGATIVE

## 2019-02-19 LAB — CBC
HCT: 31.1 % — ABNORMAL LOW (ref 39.0–52.0)
Hemoglobin: 9.6 g/dL — ABNORMAL LOW (ref 13.0–17.0)
MCH: 27.3 pg (ref 26.0–34.0)
MCHC: 30.9 g/dL (ref 30.0–36.0)
MCV: 88.4 fL (ref 80.0–100.0)
Platelets: 145 10*3/uL — ABNORMAL LOW (ref 150–400)
RBC: 3.52 MIL/uL — ABNORMAL LOW (ref 4.22–5.81)
RDW: 17.5 % — ABNORMAL HIGH (ref 11.5–15.5)
WBC: 8.9 10*3/uL (ref 4.0–10.5)
nRBC: 0 % (ref 0.0–0.2)

## 2019-02-19 LAB — RESPIRATORY PANEL BY PCR

## 2019-02-19 LAB — VITAMIN B12: Vitamin B-12: 1277 pg/mL — ABNORMAL HIGH (ref 180–914)

## 2019-02-19 LAB — RENAL FUNCTION PANEL
Albumin: 3.5 g/dL (ref 3.5–5.0)
Anion gap: 11 (ref 5–15)
BUN: 49 mg/dL — ABNORMAL HIGH (ref 8–23)
CO2: 25 mmol/L (ref 22–32)
Calcium: 8.9 mg/dL (ref 8.9–10.3)
Chloride: 101 mmol/L (ref 98–111)
Creatinine, Ser: 1.39 mg/dL — ABNORMAL HIGH (ref 0.61–1.24)
GFR calc Af Amer: 52 mL/min — ABNORMAL LOW (ref >60)
GFR calc non Af Amer: 45 mL/min — ABNORMAL LOW (ref >60)
Glucose, Bld: 84 mg/dL (ref 70–99)
Phosphorus: 4.3 mg/dL (ref 2.5–4.6)
Potassium: 3.8 mmol/L (ref 3.5–5.1)
Sodium: 137 mmol/L (ref 135–145)

## 2019-02-19 LAB — AMMONIA: Ammonia: 14 umol/L (ref 9–35)

## 2019-02-19 LAB — RPR: RPR Ser Ql: NONREACTIVE

## 2019-02-19 LAB — TSH: TSH: 4.539 u[IU]/mL — ABNORMAL HIGH (ref 0.350–4.500)

## 2019-02-19 LAB — HIV ANTIBODY (ROUTINE TESTING W REFLEX): HIV Screen 4th Generation wRfx: NONREACTIVE

## 2019-02-19 LAB — MRSA PCR SCREENING: MRSA by PCR: NEGATIVE

## 2019-02-19 LAB — T4, FREE: Free T4: 1.43 ng/dL — ABNORMAL HIGH (ref 0.61–1.12)

## 2019-02-19 MED ORDER — CLOPIDOGREL BISULFATE 75 MG PO TABS
75.0000 mg | ORAL_TABLET | Freq: Every day | ORAL | Status: DC
  Administered 2019-02-20 – 2019-02-22 (×3): 75 mg via ORAL
  Filled 2019-02-19 (×3): qty 1

## 2019-02-19 MED ORDER — ACETAMINOPHEN 325 MG PO TABS: 650.0000 mg | ORAL_TABLET | ORAL | Status: DC | PRN

## 2019-02-19 MED ORDER — PREDNISONE 10 MG PO TABS
5.0000 mg | ORAL_TABLET | Freq: Every day | ORAL | Status: DC
  Administered 2019-02-20 – 2019-02-22 (×3): 5 mg via ORAL
  Filled 2019-02-19 (×3): qty 1

## 2019-02-19 MED ORDER — LOSARTAN POTASSIUM 25 MG PO TABS
25.0000 mg | ORAL_TABLET | Freq: Every day | ORAL | Status: DC
  Administered 2019-02-20 – 2019-02-21 (×2): 25 mg via ORAL
  Filled 2019-02-19 (×2): qty 1

## 2019-02-19 MED ORDER — METHOTREXATE 2.5 MG PO TABS
25.0000 mg | ORAL_TABLET | ORAL | Status: DC
  Administered 2019-02-22: 25 mg via ORAL
  Filled 2019-02-19: qty 10

## 2019-02-19 MED ORDER — ONDANSETRON HCL 4 MG/2ML IJ SOLN: 4.0000 mg | Freq: Four times a day (QID) | INTRAMUSCULAR | Status: DC | PRN

## 2019-02-19 MED ORDER — AMLODIPINE BESYLATE 5 MG PO TABS
5.0000 mg | ORAL_TABLET | Freq: Every day | ORAL | Status: DC
  Administered 2019-02-20 – 2019-02-21 (×2): 5 mg via ORAL
  Filled 2019-02-19 (×3): qty 1

## 2019-02-19 MED ORDER — MELATONIN 3 MG PO TABS
6.0000 mg | ORAL_TABLET | Freq: Every day | ORAL | Status: DC
  Administered 2019-02-19 – 2019-02-21 (×3): 6 mg via ORAL
  Filled 2019-02-19 (×4): qty 2

## 2019-02-19 MED ORDER — ASPIRIN EC 81 MG PO TBEC
81.0000 mg | DELAYED_RELEASE_TABLET | Freq: Every day | ORAL | Status: DC
  Administered 2019-02-20 – 2019-02-22 (×3): 81 mg via ORAL
  Filled 2019-02-19 (×3): qty 1

## 2019-02-19 MED ORDER — ENOXAPARIN SODIUM 30 MG/0.3ML ~~LOC~~ SOLN
30.0000 mg | Freq: Every day | SUBCUTANEOUS | Status: DC
  Administered 2019-02-20: 30 mg via SUBCUTANEOUS
  Filled 2019-02-19: qty 0.3

## 2019-02-19 MED ORDER — ATORVASTATIN CALCIUM 10 MG PO TABS
20.0000 mg | ORAL_TABLET | Freq: Every day | ORAL | Status: DC
  Administered 2019-02-20 – 2019-02-22 (×3): 20 mg via ORAL
  Filled 2019-02-19 (×3): qty 2

## 2019-02-19 MED ORDER — LEVOTHYROXINE SODIUM 100 MCG PO TABS
100.0000 ug | ORAL_TABLET | Freq: Every day | ORAL | Status: DC
  Administered 2019-02-20 – 2019-02-22 (×3): 100 ug via ORAL
  Filled 2019-02-19 (×5): qty 1

## 2019-02-19 MED ORDER — CARVEDILOL 6.25 MG PO TABS
6.2500 mg | ORAL_TABLET | Freq: Two times a day (BID) | ORAL | Status: DC
  Administered 2019-02-21 – 2019-02-22 (×3): 6.25 mg via ORAL
  Filled 2019-02-19 (×5): qty 1

## 2019-02-19 MED ORDER — SODIUM CHLORIDE 0.9% FLUSH: 3.0000 mL | INTRAVENOUS | Status: DC | PRN

## 2019-02-19 MED ORDER — NON FORMULARY: 6.0000 mg | Freq: Every day | Status: DC

## 2019-02-19 MED ORDER — FUROSEMIDE 10 MG/ML IJ SOLN
40.0000 mg | Freq: Two times a day (BID) | INTRAMUSCULAR | Status: DC
  Administered 2019-02-19 – 2019-02-21 (×4): 40 mg via INTRAVENOUS
  Filled 2019-02-19 (×5): qty 4

## 2019-02-19 MED ORDER — SODIUM CHLORIDE 0.9% FLUSH
3.0000 mL | Freq: Two times a day (BID) | INTRAVENOUS | Status: DC
  Administered 2019-02-19 – 2019-02-21 (×6): 3 mL via INTRAVENOUS

## 2019-02-19 MED ORDER — SODIUM CHLORIDE 0.9 % IV SOLN: 250.0000 mL | INTRAVENOUS | Status: DC | PRN

## 2019-02-19 NOTE — Progress Notes (Signed)
Patient agitated and combative during care, will not take any of his PO medication. He is oriented to self only. Patient 98% on room air tolerating well. Will continue to monitor.

## 2019-02-19 NOTE — Plan of Care (Signed)
  Problem: Education: Goal: Knowledge of General Education information will improve Description: Including pain rating scale, medication(s)/side effects and non-pharmacologic comfort measures Outcome: Progressing   Problem: Clinical Measurements: Goal: Respiratory complications will improve Outcome: Progressing Goal: Cardiovascular complication will be avoided Outcome: Progressing   

## 2019-02-19 NOTE — Progress Notes (Signed)
  Echocardiogram 2D Echocardiogram has been performed.  Hunter Choi 02/19/2019, 2:36 PM

## 2019-02-19 NOTE — Progress Notes (Signed)
Son stopped by this evening and brought patients hearing aid and hearing Runner, broadcasting/film/video. [name] (son) was updated on patients status.

## 2019-02-19 NOTE — Plan of Care (Signed)
  Problem: Education: Goal: Knowledge of General Education information will improve Description: Including pain rating scale, medication(s)/side effects and non-pharmacologic comfort measures Outcome: Not Progressing   Problem: Health Behavior/Discharge Planning: Goal: Ability to manage health-related needs will improve Outcome: Not Progressing   Problem: Clinical Measurements: Goal: Ability to maintain clinical measurements within normal limits will improve Outcome: Progressing Goal: Will remain free from infection Outcome: Progressing Goal: Diagnostic test results will improve Outcome: Progressing Goal: Respiratory complications will improve Outcome: Progressing Goal: Cardiovascular complication will be avoided Outcome: Progressing   Problem: Activity: Goal: Risk for activity intolerance will decrease Outcome: Not Progressing   Problem: Nutrition: Goal: Adequate nutrition will be maintained Outcome: Not Progressing   Problem: Coping: Goal: Level of anxiety will decrease Outcome: Not Progressing   Problem: Elimination: Goal: Will not experience complications related to bowel motility Outcome: Progressing Goal: Will not experience complications related to urinary retention Outcome: Progressing   Problem: Pain Managment: Goal: General experience of comfort will improve Outcome: Progressing   Problem: Safety: Goal: Ability to remain free from injury will improve Outcome: Progressing   Problem: Skin Integrity: Goal: Risk for impaired skin integrity will decrease Outcome: Progressing   Problem: Education: Goal: Ability to demonstrate management of disease process will improve Outcome: Not Progressing Goal: Ability to verbalize understanding of medication therapies will improve Outcome: Not Progressing Goal: Individualized Educational Video(s) Outcome: Not Progressing   Problem: Activity: Goal: Capacity to carry out activities will improve Outcome: Not  Progressing   Problem: Cardiac: Goal: Ability to achieve and maintain adequate cardiopulmonary perfusion will improve Outcome: Progressing

## 2019-02-19 NOTE — Progress Notes (Signed)
pMN admission of dysnea and confusion. See H&P for full details. Spoke with son about condition. Concern for his weakness and DOE. Hx of HF, CABG, TAVR. CXR actually looks ok. Chronically elevated BNP. Echo pending. Hasn't had any sick contacts. Son describes a little cough and there is minor wheeze on exam. Will also look at an RVP. Otherwise, continue as per H&P.   General: 85 y.o. male resting in bed in NAD Cardiovascular: RRR, +S1, S2, no m/g/r Respiratory: minor scattered wheeze, normal WOB GI: BS+, NDNT, no masses noted MSK: No e/c/c Neuro: agitated this AM; opens eyes to commands, but not cooperative with anything else  Acute on chronic HFrEF s/p TAVR     - lasix increased at home d/t concern for change in breathing and increased edema of the legs     - CXR with stable cardiomegaly w/o overt pulmonary edema     - BNP is chronically elevated (high as >4500 Feb 2020; baseline seems to be 1500?)     - Echo is pending     - drop his lasix to qday dosing     - renal panel pending.  Acute Metabolic Encephalopathy:      - Acute confusion in ED.      - May be related to delirium/sun downing as started in the evening after several hours in the hospital.      - Son reports this is a change from father's baseline mental status.     - No signs of infection. (CXR neg, UA neg, Bld Cx is neg to date)     - No focal symptoms; continue q4h neurochecks      - TSH is mildly elevated, check FT4/FT3     - ammonia is normal     - HIV negative, RPR non-reactive     - UDS pending     - B12 elevated, THF pending     - try to normalize sleep cycle (bright room during day, dark at night, add melatonin)  CKD Stage IIIb:      - SCr 1.63, appears consistent with baseline.      - watch nephrotoxins, monitor   Hypothyroidism:      - in Feb 2020, his TSH was 15.1; it is 4.5 today      - he is on synthroid 100 mcg at home, continue     - check FT4/FT3  Rheumatoid Arthritis:      - continue  methotrexate and prednisone   History of CVA:      - continue Lipitor, Plavix and ASA   CAD s/p CABG:      - EKG is ok     - continue Coreg, Lipitor, ASA.   Marland KitchenJonnie Finner, DO

## 2019-02-19 NOTE — H&P (Signed)
History and Physical        Hospital Admission Note Date: 02/19/2019  Patient name: Hunter Choi Medical record number: [id number] Date of birth: 12-17-1927 Age: 85 y.o. Gender: male  PCP: Leeroy Cha, MD    Patient coming from: Home   I have reviewed all records in the [facility].    Chief Complaint:  Shortness of Breath   HPI: Hunter Choi is 85 y.o. male with PMH significant for CAD, s/p TARV, CHF, CVA, hypothyroidism who presents to ER for worsening dyspnea. Patient is accompanied by his son, Hunter Choi, who is his full time caretaker. Increased WOB noted over the past 3 days. Had a 5lb weight gain. Son increased home Lasix 40 mg qd to 40 mg BID. Has noted increased edema to LE bilaterally. Patient has had diminished appetite recently and some generalized weakness.   While in the ED, son noted that patient began to exhibit signs of confusion. Has referred to his son by the wrong name and at one point thought his son was his nurse. Son worried because his father is normally very mentally intact and has also been confused when he has been admitted to the hospital for CHF exacerbations in the past, most recently in February.    ED work-up/course:  Patient noted to have mildly increased WOB but no hypoxia in the ED. BNP elevated to 2600 despite increased Lasix dose the past couple of days. Patient became increasingly confused during his ER stay. Noted to have mild leukocytosis.   Review of Systems: Positives marked in 'bold' Constitutional: Denies fever, chills, diaphoresis, poor appetite and fatigue.  HEENT: Denies photophobia, eye pain, redness, hearing loss, ear pain, congestion, sore throat, rhinorrhea, sneezing, mouth sores, trouble swallowing, neck pain, neck stiffness and tinnitus.   Respiratory: Denies SOB, DOE, cough, chest tightness,  and wheezing.   Cardiovascular: Denies chest pain, palpitations and  leg swelling.  Gastrointestinal: Denies nausea, vomiting, abdominal pain, diarrhea, constipation, blood in stool and abdominal distention.  Genitourinary: Denies dysuria, urgency, frequency, hematuria, flank pain and difficulty urinating.  Musculoskeletal: Denies myalgias, back pain, joint swelling, arthralgias and gait problem.  Skin: Denies pallor, rash and wound.  Neurological: Denies dizziness, seizures, syncope, weakness, light-headedness, numbness and headaches.  Hematological: Denies adenopathy. Easy bruising, personal or family bleeding history  Psychiatric/Behavioral: Denies suicidal ideation, mood changes, confusion, nervousness, sleep disturbance and agitation  Past Medical History: Past Medical History:  Diagnosis Date  . AAA (abdominal aortic aneurysm) ([place])    Dr. Rosalia Hammers  . Aortic stenosis, severe    a. s/p TAVR  . Blind left eye   . BPH (benign prostatic hypertrophy)    Dr. Diona Fanti  . CAD (coronary artery disease) of bypass graft    Chronic occlusion of saphenous vein graft to RCA  . Coronary artery disease    Left main disease and severe 3-vessel CAD  . Diastolic heart failure ([place])   . Embolism involving retinal artery   . GERD (gastroesophageal reflux disease)   . Hard of hearing   . Hypertension   . Hypothyroidism   . Iliac artery aneurysm (HCC)    Dr. Scot Dock  . PAF (paroxysmal  atrial fibrillation) (HCC)   . 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 12/2012: mm Stephannie Peters XT transcatheter heart valve placed via transapical approach  . Stroke [facility])    mini stroke effective his eye left    Past Surgical History:  Procedure Laterality Date  . BIOPSY PROSTATE  2005  . BUNIONECTOMY WITH HAMMERTOE RECONSTRUCTION Bilateral   . CARDIAC CATHETERIZATION    . CARDIAC CATHETERIZATION N/A 05/07/2015   Procedure: Right Heart Cath and Coronary/Graft Angiography;  Surgeon: Lyn Records, MD;  Location: [facility] INVASIVE CV LAB;  Service:  Cardiovascular;  Laterality: N/A;  . CAROTID ENDARTERECTOMY Right Jan. 2, 1997  . CATARACT EXTRACTION W/ INTRAOCULAR LENS IMPLANT Right   . CHOLECYSTECTOMY N/A 01/28/2015   Procedure: LAPAROSCOPIC CHOLECYSTECTOMY WITH INTRAOPERATIVE CHOLANGIOGRAM;  Surgeon: Gaynelle Adu, MD;  Location: [facility] OR;  Service: General;  Laterality: N/A;  . COLONOSCOPY    . CORONARY ARTERY BYPASS GRAFT  01/1998   Dr. Sheliah Plane; LIMA to LAD, SVG to D1, SVG to LCx, SVG to RCA, open saphenous vein harvest via right lower extremity  . HARVEST BONE GRAFT  1982   FOR THE  ANKLE  . HEMIARTHROPLASTY HIP Left    AFTER HIP FX  . INTRAOPERATIVE TRANSESOPHAGEAL ECHOCARDIOGRAM N/A 01/15/2013   Procedure: INTRAOPERATIVE TRANSESOPHAGEAL ECHOCARDIOGRAM;  Surgeon: [name], MD;  Location: [facility] OR;  Service: Open Heart Surgery;  Laterality: N/A;  . JOINT REPLACEMENT    . LEFT AND RIGHT HEART CATHETERIZATION WITH CORONARY ANGIOGRAM N/A 12/20/2012   Procedure: LEFT AND RIGHT HEART CATHETERIZATION WITH CORONARY ANGIOGRAM;  Surgeon: Lesleigh Noe, MD;  Location: [facility] CATH LAB;  Service: Cardiovascular;  Laterality: N/A;  . LEFT HEART CATH AND CORS/GRAFTS ANGIOGRAPHY N/A 02/14/2017   Procedure: LEFT HEART CATH AND CORS/GRAFTS ANGIOGRAPHY;  Surgeon: Marykay Lex, MD;  Location: [facility] INVASIVE CV LAB;  Service: Cardiovascular;  Laterality: N/A;  . SKIN GRAFTS  1968  . TOTAL HIP ARTHROPLASTY Left 2007  . TOTAL KNEE ARTHROPLASTY  1992-2002   right-left  . TRANSCATHETER AORTIC VALVE REPLACEMENT, TRANSAPICAL N/A 01/15/2013   Procedure: TRANSCATHETER AORTIC VALVE REPLACEMENT, TRANSAPICAL;  Surgeon: [name], MD;  Location: [facility] OR;  Service: Open Heart Surgery;  Laterality: N/A;  . TRANSURETHRAL RESECTION OF PROSTATE  2012    Medications: Prior to Admission medications   Medication Sig Start Date End Date Taking? Authorizing Provider  acetaminophen (TYLENOL) 500 MG tablet Take 1,000 mg by mouth 2 (two) times daily.   Yes  [provider]  amLODipine (NORVASC) 5 MG tablet Take 5 mg by mouth daily.   Yes [provider]  aspirin EC 81 MG EC tablet Take 1 tablet (81 mg total) by mouth daily. 01/21/13  Yes Doree Fudge M, PA-C  atorvastatin (LIPITOR) 20 MG tablet Take 20 mg by mouth daily.   Yes [provider]  carvedilol (COREG) 6.25 MG tablet Take 1 tablet (6.25 mg total) by mouth 2 (two) times daily with a meal. 10/21/15  Yes Lyn Records, MD  clopidogrel (PLAVIX) 75 MG tablet TAKE 1 TABLET BY MOUTH EVERY DAY Patient taking differently: Take 75 mg by mouth daily.  11/12/18  Yes Leone Brand, NP  feeding supplement, ENSURE ENLIVE, (ENSURE ENLIVE) LIQD Take 237 mLs by mouth 3 (three) times daily between meals. 03/27/18  Yes Dhungel, Nishant, MD  folic acid (FOLVITE) 1 MG tablet Take 3 mg by mouth daily.   Yes [provider]  furosemide (LASIX) 20  MG tablet TAKE 2 TABLETS BY MOUTH EVERY DAY Patient taking differently: Take 40 mg by mouth daily.  01/15/19  Yes Lyn RecordsSmith, Henry W, MD  levothyroxine (SYNTHROID, LEVOTHROID) 100 MCG tablet Take 100 mcg by mouth daily before breakfast. 05/09/18  Yes [provider]  losartan (COZAAR) 25 MG tablet Take 25 mg by mouth daily. 03/19/18  Yes [provider]  methotrexate 2.5 MG tablet Take 25 mg by mouth every Friday.    Yes [provider]  Multiple Vitamin (MULTIVITAMIN WITH MINERALS) TABS tablet Take 1 tablet by mouth daily. 03/27/18  Yes Dhungel, Nishant, MD  predniSONE (DELTASONE) 5 MG tablet Take 5 mg by mouth daily. 04/07/15  Yes [provider]  vitamin B-12 (CYANOCOBALAMIN) 1000 MCG tablet Take 1 tablet (1,000 mcg total) by mouth daily. 03/27/18  Yes Dhungel, Nishant, MD  Calcium Carbonate Antacid (TUMS PO) Take 1 tablet by mouth 2 (two) times daily as needed (heartburn/indigestion).    [provider]  fluticasone (FLONASE) 50 MCG/ACT nasal spray Place 1 spray into both nostrils at  bedtime as needed for allergies or rhinitis.    [provider]  nitroGLYCERIN (NITROSTAT) 0.4 MG SL tablet Place 0.4 mg under the tongue every 5 (five) minutes as needed for chest pain.    [provider]    Allergies:   Allergies  Allergen Reactions  . Ciprofloxacin Other (See Comments)    REACTION: mild delirium    Social History:  reports that he quit smoking about 55 years ago. His smoking use included cigarettes. He has a 18.50 pack-year smoking history. He has quit using smokeless tobacco.  His smokeless tobacco use included chew. He reports that he does not drink alcohol or use drugs.  Family History: Family History  Problem Relation Age of Onset  . Heart attack Mother   . Heart disease Mother        Before age 83  . Hyperlipidemia Mother   . Hypertension Mother   . Hypertension Son   . Heart disease Father        AAA-   . Hyperlipidemia Father   . Heart attack Father   . Hypertension Father   . Heart disease Brother        Before age 83  . Hyperlipidemia Brother   . Heart attack Brother   . Hypertension Brother     Physical Exam: Blood pressure (!) 136/58, pulse 65, temperature 98.1 F (36.7 C), temperature source Oral, resp. rate 20, SpO2 100 %. General: Alert, awake, oriented to person and place, not oriented to time, in no acute distress. Eyes: pink conjunctiva,anicteric sclera, pupils equal and reactive to light and accomodation, HEENT: normocephalic, atraumatic, oropharynx clear Neck: supple, no masses or lymphadenopathy, no goiter, no bruits, no JVD CVS: Regular rate and rhythm, without murmurs, rubs or gallops. 1+ pitting edema to shins bilaterally.  Resp : Mildly increased WOB but normal respiratory rate. No accessory muscle use. Faint crackles and expiratory wheeze appreciated otherwise CTAB.  GI : Soft, nontender, nondistended, positive bowel sounds, no masses. No hepatomegaly. No hernia.  Musculoskeletal: No clubbing or cyanosis,  positive pedal pulses. No contracture. ROM intact  Neuro: Grossly intact, no focal neurological deficits, strength 5/5 upper and lower extremities bilaterally. CN II-XII intact. Follows commands. Sensation to extremities intact.  Psych:ormal mood and affect Skin: no rashes or lesions, warm and dry   LABS on Admission: I have personally reviewed all the labs and imagings below    Basic Metabolic Panel:  Recent Labs  Lab 02/18/19 1639  NA 134*  K 4.7  CL 100  CO2 24  GLUCOSE 137*  BUN 50*  CREATININE 1.63*  CALCIUM 9.0   Liver Function Tests: No results for input(s): AST, ALT, ALKPHOS, BILITOT, PROT, ALBUMIN in the last 168 hours. No results for input(s): LIPASE, AMYLASE in the last 168 hours. No results for input(s): AMMONIA in the last 168 hours. CBC: Recent Labs  Lab 02/18/19 1639  WBC 10.8*  HGB 9.7*  HCT 32.2*  MCV 89.9  PLT 147*   Cardiac Enzymes: No results for input(s): CKTOTAL, CKMB, CKMBINDEX, TROPONINI in the last 168 hours. BNP: Invalid input(s): POCBNP CBG: No results for input(s): GLUCAP in the last 168 hours.  Radiological Exams on Admission:  Dg Chest Portable 1 View  Result Date: 02/18/2019 CLINICAL DATA:  Weakness, dyspnea, CHF EXAM: PORTABLE CHEST 1 VIEW COMPARISON:  05/15/2018 chest radiograph. FINDINGS: Intact sternotomy wires. CABG clips overlie the mediastinum. Aortic valve prosthesis in place. Stable cardiomediastinal silhouette with mild cardiomegaly. No pneumothorax. Slight blunting of the right costophrenic angle. No left pleural effusion. No overt pulmonary edema. No acute consolidative airspace disease. Faint reticular opacities at the lung bases appear unchanged. Surgical clips again noted in medial lower right neck. IMPRESSION: 1. Stable cardiomegaly without overt pulmonary edema. 2. Slight blunting of the right costophrenic angle, cannot exclude trace right pleural effusion. 3. Chronic faint reticular opacities at the lung bases, suggesting  nonspecific mild pulmonary fibrosis. Electronically Signed   By: Delbert Phenix M.D.   On: 02/18/2019 20:05      EKG: Independently reviewed. No acute changes from prior.    Assessment/Plan Active Problems:   Rheumatoid arthritis (HCC)   Acute on chronic combined systolic and diastolic congestive heart failure, NYHA class 4 (HCC)   CKD (chronic kidney disease) stage 3, GFR 30-59 ml/min   Acute metabolic encephalopathy   Hypothyroidism   Shortness of breath  Acute on Chronic CHF:  Patient with reported dyspnea and increased work of breathing at home. Has some labored breathing on exam and mild crackles in bases, no hypoxia present. Patient with reported weight gain at home of 5lbs despite diminished appetite. LE edema noted. BNP 2601. CXR with ?trace right pleural effusion, no overt pulmonary edema. BNP has previously been as hight as 4500k. Value obtained this admission does appear to be higher than baseline. Level is chronically elevated from review of prior labs. Last echo with EF 30-35% in Feb 2020. PE less likely as not hypoxic or tachycardic.  No clinical signs of DVT on exam. -admit to inpatient, tele with continuous pulse ox  -start Lasix 40 mg IV BID, adjust as needed  -daily weights  -strict I/Os  -echo    Acute Metabolic Encephalopathy:  Acute confusion in ED. May be related to delirium/sun downing as started in the evening after several hours in the hospital. Son reports this is a change from father's baseline mental status. Mild leukocytosis present to 10.8. However, patient is afebrile and vital signs are otherwise stable. No infectious symptoms. UA unremarkable and no evidence of PNA on CXR. No sedating medications noted on home medication list. Neurologically patient is intact.  -obtain TSH, B12, folate, ammonia, HIV, RPR, UDS  -blood and urine cultures ordered  -repeat CBC in AM  -will hold off on imaging as patient presented without confusion initially and has had no  events in ER that would be concerning for hemorrhage or infarct, if neuro status worsens would obtain CT  head  -neuro checks by RN q4h   CKD Stage III:  Cr 1.63, appears consistent with baseline.  -continue to monitor    Hypothyroidism:  Last TSH 15 in Feb 2020.  -repeat TSH  -continue Synthroid 100 mcg   Rheumatoid Arthritis:  -Continue home methotrexate and prednisone   History of CVA:  -Continue Lipitor, Plavix and ASA    CAD s/p CABG:  EKG without acute changes.  -Continue Coreg, Lipitor, ASA.     DVT prophylaxis: Lovenox   CODE STATUS: Full   Consults called: None   Family Communication: Admission, patients condition and plan of care including tests being ordered have been discussed with the patient and son, Hunter Choi, who indicates understanding and agree with the plan and Code Status  Admission status:   The medical decision making on this patient was of high complexity and the patient is at high risk for clinical deterioration, therefore this is a level 3 admission.  Severity of Illness:     Moderate  The appropriate patient status for this patient is INPATIENT. Inpatient status is judged to be reasonable and necessary in order to provide the required intensity of service to ensure the patient's safety. The patient's presenting symptoms, physical exam findings, and initial radiographic and laboratory data in the context of their chronic comorbidities is felt to place them at high risk for further clinical deterioration. Furthermore, it is not anticipated that the patient will be medically stable for discharge from the hospital within 2 midnights of admission. The following factors support the patient status of inpatient.   " The patient's presenting symptoms include increased WOB, SOB, confusion concerning for acute exacerbation CHF and acute metabolic encephalopathy . " The worrisome physical exam findings include disorientation, faint crackles in bases and  increased WOB. " The initial radiographic and laboratory data are worrisome because of elevated BNP. " The chronic co-morbidities include CAD, s/p TARV, CHF, CVA, hypothyroidism .   * I certify that at the point of admission it is my clinical judgment that the patient will require inpatient hospital care spanning beyond 2 midnights from the point of admission due to high intensity of service, high risk for further deterioration and high frequency of surveillance required.*    Time Spent on Admission: 52 minutes      De Hollingsheadatherine L Starleen Trussell M.D. [facility] 02/19/2019, 12:57 AM

## 2019-02-20 DIAGNOSIS — I5043 Acute on chronic combined systolic (congestive) and diastolic (congestive) heart failure: Secondary | ICD-10-CM

## 2019-02-20 LAB — FOLATE RBC
Folate, Hemolysate: >620 ng/mL
Folate, RBC: >2020 ng/mL (ref >498)
Hematocrit: 30.7 % — ABNORMAL LOW (ref 37.5–51.0)

## 2019-02-20 LAB — BASIC METABOLIC PANEL
Anion gap: 14 (ref 5–15)
BUN: 51 mg/dL — ABNORMAL HIGH (ref 8–23)
CO2: 22 mmol/L (ref 22–32)
Calcium: 8.7 mg/dL — ABNORMAL LOW (ref 8.9–10.3)
Chloride: 98 mmol/L (ref 98–111)
Creatinine, Ser: 1.68 mg/dL — ABNORMAL HIGH (ref 0.61–1.24)
GFR calc Af Amer: 41 mL/min — ABNORMAL LOW (ref >60)
GFR calc non Af Amer: 35 mL/min — ABNORMAL LOW (ref >60)
Glucose, Bld: 114 mg/dL — ABNORMAL HIGH (ref 70–99)
Potassium: 3.8 mmol/L (ref 3.5–5.1)
Sodium: 134 mmol/L — ABNORMAL LOW (ref 135–145)

## 2019-02-20 LAB — T3, FREE: T3, Free: 1.8 pg/mL — ABNORMAL LOW (ref 2.0–4.4)

## 2019-02-20 NOTE — Evaluation (Signed)
Physical Therapy Evaluation Patient Details Name: Hunter Choi MRN: [id number] DOB: 04-09-1927 Today's Date: 02/20/2019   History of Present Illness  85 y.o. male with PMH significant for CAD, s/p TAVR, CHF, CVA, hypothyroidism who presents to ER for worsening dyspnea, LE edema, weakness, and AMS.   Clinical Impression  Pt demonstrates deficits in functional mobility, transfers, balance, strength, and power. Pt does not appear far from his baseline per pt history, however pt is extremely HOH and may not be the most reliable historian. Pt will requires physical assistance to perform bed mobility at this time due to UE and trunk weakness, and requires close guard for transfers. Pt will benefit from continued acute PT services to reduce falls risk and to restore the pt's PLOF.    Follow Up Recommendations No PT follow up;Supervision/Assistance - 24 hour(as long as pt has 24/7 assistance for OOB activity)    Equipment Recommendations  None recommended by PT    Recommendations for Other Services       Precautions / Restrictions Precautions Precautions: Fall Restrictions Weight Bearing Restrictions: No      Mobility  Bed Mobility Overal bed mobility: Needs Assistance Bed Mobility: Supine to Sit;Rolling Rolling: Supervision   Supine to sit: Mod assist     General bed mobility comments: pt requires assistance for UE and trunk to pull up to get to edge of bed  Transfers Overall transfer level: Needs assistance Equipment used: None Transfers: Squat Pivot Transfers     Squat pivot transfers: Min guard     General transfer comment: pt pulling on armrests of chair during transfer  Ambulation/Gait                Stairs            Wheelchair Mobility    Modified Rankin (Stroke Patients Only)       Balance Overall balance assessment: Needs assistance Sitting-balance support: Bilateral upper extremity supported;Feet supported Sitting balance-Leahy Scale:  Fair Sitting balance - Comments: minG-close supervision   Standing balance support: (pt unable to come to complete stand)                                 Pertinent Vitals/Pain Pain Assessment: No/denies pain    Home Living Family/patient expects to be discharged to:: Private residence Living Arrangements: Children Available Help at Discharge: Family;Available 24 hours/day Type of Home: House Home Access: Level entry     Home Layout: One level Home Equipment: Walker - 2 wheels;Wheelchair - manual;Bedside commode Additional Comments: Some home information from previous notes    Prior Function Level of Independence: Needs assistance   Gait / Transfers Assistance Needed: Pt reports he is often able to transfer from bed to wheelchair without assistance, does report he rarely pushes the wheelchair himself  ADL's / Homemaking Assistance Needed: reports sons assist with ADLs         Hand Dominance   Dominant Hand: Right    Extremity/Trunk Assessment   Upper Extremity Assessment Upper Extremity Assessment: Generalized weakness(significant RA in L hand)    Lower Extremity Assessment Lower Extremity Assessment: Generalized weakness    Cervical / Trunk Assessment Cervical / Trunk Assessment: Kyphotic  Communication   Communication: HOH  Cognition Arousal/Alertness: Awake/alert Behavior During Therapy: WFL for tasks assessed/performed Overall Cognitive Status: Within Functional Limits for tasks assessed  General Comments General comments (skin integrity, edema, etc.): VSS during session on RA    Exercises     Assessment/Plan    PT Assessment Patient needs continued PT services  PT Problem List Decreased strength;Decreased activity tolerance;Decreased balance;Decreased mobility;Decreased knowledge of use of DME       PT Treatment Interventions DME instruction;Functional mobility  training;Therapeutic activities;Therapeutic exercise;Balance training;Neuromuscular re-education;Wheelchair mobility training;Patient/family education    PT Goals (Current goals can be found in the Care Plan section)  Acute Rehab PT Goals Patient Stated Goal: To go home PT Goal Formulation: With patient Time For Goal Achievement: 03/06/19 Potential to Achieve Goals: Good Additional Goals Additional Goal #1: Pt will mobilize in manual wheelchair for 50' with minA utilizing BUE and LE as needed.    Frequency Min 3X/week   Barriers to discharge        Co-evaluation               AM-PAC PT "6 Clicks" Mobility  Outcome Measure Help needed turning from your back to your side while in a flat bed without using bedrails?: None Help needed moving from lying on your back to sitting on the side of a flat bed without using bedrails?: A Lot Help needed moving to and from a bed to a chair (including a wheelchair)?: A Little Help needed standing up from a chair using your arms (e.g., wheelchair or bedside chair)?: A Little Help needed to walk in hospital room?: Total Help needed climbing 3-5 steps with a railing? : Total 6 Click Score: 14    End of Session Equipment Utilized During Treatment: Gait belt Activity Tolerance: Patient tolerated treatment well Patient left: in chair;with call bell/phone within reach;with chair alarm set Nurse Communication: Mobility status PT Visit Diagnosis: Muscle weakness (generalized) (M62.81)    Time: 3009-2330 PT Time Calculation (min) (ACUTE ONLY): 31 min   Charges:   PT Evaluation $PT Eval Low Complexity: 1 Low PT Treatments $Therapeutic Activity: 8-22 mins        Zenaida Niece, PT, DPT Acute Rehabilitation Pager: [phone]   Zenaida Niece 02/20/2019, 2:45 PM

## 2019-02-20 NOTE — Plan of Care (Signed)

## 2019-02-20 NOTE — Plan of Care (Signed)
  Problem: Education: Goal: Knowledge of General Education information will improve Description: Including pain rating scale, medication(s)/side effects and non-pharmacologic comfort measures Outcome: Progressing   Problem: Health Behavior/Discharge Planning: Goal: Ability to manage health-related needs will improve Outcome: Progressing   Problem: Clinical Measurements: Goal: Will remain free from infection Outcome: Progressing   

## 2019-02-20 NOTE — Progress Notes (Signed)
PROGRESS NOTE    Hunter HoarClyde G Niznik  ZOX:[id number] DOB: [birth year] DOA: 02/18/2019 PCP: Lorenda IshiharaVaradarajan, [name], MD     Brief Narrative:  Hunter Choi is a 85 y.o. male with PMH significant for CAD, s/p TAVR, CHF, CVA, hypothyroidism who presents to ER for worsening dyspnea. Patient is accompanied by his son, Nida BoatmanBrad, who is his full time caretaker. Increased WOB noted over the past 3 days. Had a 5lb weight gain. Son increased home Lasix 40 mg qd to 40 mg BID. Has noted increased edema to LE bilaterally. Patient has had diminished appetite recently and some generalized weakness. While in the ED, son noted that patient began to exhibit signs of confusion. Has referred to his son by the wrong name and at one point thought his son was his nurse. Son worried because his father is normally very mentally intact and has also been confused when he has been admitted to the hospital for CHF exacerbations in the past, most recently in February.   New events last 24 hours / Subjective: Doing well this morning, oriented to self and place but not to year. No complaints of SOB or pain.   Assessment & Plan:   Active Problems:   Rheumatoid arthritis (HCC)   Acute on chronic combined systolic and diastolic congestive heart failure, NYHA class 4 (HCC)   CKD (chronic kidney disease) stage 3, GFR 30-59 ml/min   Acute metabolic encephalopathy   Hypothyroidism   Shortness of breath   Acute on chronic combined systolic and diastolic CHF:  -Patient with reported dyspnea and increased work of breathing at home. Has some labored breathing on exam and mild crackles in bases, no hypoxia present. Patient with reported weight gain at home of 5lbs despite diminished appetite. LE edema noted. BNP 2601. CXR with ?trace right pleural effusion, no overt pulmonary edema. BNP has previously been as high as 4500. Value obtained this admission does appear to be higher than baseline. Level is chronically elevated from review of prior  labs. Last echo with EF 30-35% in Feb 2020.  -Continue Lasix  -Daily weights, strict I/Os  -I/O not accurately measured. Weight down 152-->148lb   Acute metabolic encephalopathy:  -May be related to delirium/sun downing as started in the evening after several hours in the hospital.  -Vit B 12: 1277 -RPR NR  -Ammonia 14  -HIV NR  -Seems to be improved, continue to monitor -Delirium precaution   CKD Stage IIIb -Baseline Cr ~1.2-1.5 -Stable   Hypothyroidism:  -TSH 4.539, free T4 1.43. Repeat labs in 4-6 weeks as outpatient  -Continue Synthroid 100 mcg   HTN: -Continue norvasc, coreg, cozaar   Rheumatoid arthritis:  -Continue home methotrexate and prednisone   History of CVA:  -Continue Lipitor, Plavix and ASA   CAD s/p CABG:   -Continue Coreg, Lipitor, ASA, Plavix    In agreement with assessment of the pressure ulcer as below:  Pressure Ulcer 01/27/15 Stage I -  Intact skin with non-blanchable redness of a localized area usually over a bony prominence. redness. no open areas (Active)  01/27/15 1249  Location: Sacrum  Location Orientation: Left;Right  Staging: Stage I -  Intact skin with non-blanchable redness of a localized area usually over a bony prominence.  Wound Description (Comments): redness. no open areas  Present on Admission: Yes         DVT prophylaxis: SCD Code Status: Full Family Communication: None at bedside Disposition Plan: Pending PT OT evaluation. Continue IV lasix today    Consultants:  None  Procedures:   None   Antimicrobials:  Anti-infectives (From admission, onward)   None        Objective: Vitals:   02/20/19 0708 02/20/19 0800 02/20/19 1000 02/20/19 1052  BP: (!) 135/57   (!) 112/50  Pulse: 64 (!) 59 65 62  Resp: (!) 22 13 20 14   Temp:    98.1 F (36.7 C)  TempSrc:    Oral  SpO2: 97% 99% 91% 98%  Weight:      Height:        Intake/Output Summary (Last 24 hours) at 02/20/2019 1140 Last data filed at  02/20/2019 1013 Gross per 24 hour  Intake 422 ml  Output 525 ml  Net -103 ml   Filed Weights   02/19/19 0200 02/20/19 0500  Weight: 69.3 kg 67.4 kg    Examination:  General exam: Appears calm and comfortable  Respiratory system: Clear to auscultation. Respiratory effort normal. No respiratory distress. No conversational dyspnea. On room air  Cardiovascular system: S1 & S2 heard, RRR. No murmurs. No pedal edema. Gastrointestinal system: Abdomen is nondistended, soft and nontender. Normal bowel sounds heard. Central nervous system: Alert and oriented to person and place, not year "2008". No focal neurological deficits. Speech clear.  Extremities: Symmetric in appearance  Skin: No rashes, lesions or ulcers on exposed skin   Data Reviewed: I have personally reviewed following labs and imaging studies  CBC: Recent Labs  Lab 02/18/19 1639 02/19/19 0458  WBC 10.8* 8.9  HGB 9.7* 9.6*  HCT 32.2* 31.1*  MCV 89.9 88.4  PLT 147* 409*   Basic Metabolic Panel: Recent Labs  Lab 02/18/19 1639 02/19/19 1400 02/20/19 0852  NA 134* 137 134*  K 4.7 3.8 3.8  CL 100 101 98  CO2 24 25 22   GLUCOSE 137* 84 114*  BUN 50* 49* 51*  CREATININE 1.63* 1.39* 1.68*  CALCIUM 9.0 8.9 8.7*  PHOS  --  4.3  --    GFR: Estimated Creatinine Clearance: 27.9 mL/min (A) (by C-G formula based on SCr of 1.68 mg/dL (H)). Liver Function Tests: Recent Labs  Lab 02/19/19 1400  ALBUMIN 3.5   No results for input(s): LIPASE, AMYLASE in the last 168 hours. Recent Labs  Lab 02/19/19 0458  AMMONIA 14   Coagulation Profile: No results for input(s): INR, PROTIME in the last 168 hours. Cardiac Enzymes: No results for input(s): CKTOTAL, CKMB, CKMBINDEX, TROPONINI in the last 168 hours. BNP (last 3 results) No results for input(s): PROBNP in the last 8760 hours. HbA1C: No results for input(s): HGBA1C in the last 72 hours. CBG: No results for input(s): GLUCAP in the last 168 hours. Lipid Profile: No  results for input(s): CHOL, HDL, LDLCALC, TRIG, CHOLHDL, LDLDIRECT in the last 72 hours. Thyroid Function Tests: Recent Labs    02/19/19 0458 02/19/19 1400  TSH 4.539*  --   FREET4  --  1.43*  T3FREE  --  1.8*   Anemia Panel: Recent Labs    02/19/19 0458  VITAMINB12 1,277*   Sepsis Labs: No results for input(s): PROCALCITON, LATICACIDVEN in the last 168 hours.  Recent Results (from the past 240 hour(s))  SARS CORONAVIRUS 2 (TAT 6-24 HRS) Nasopharyngeal Nasopharyngeal Swab     Status: None   Collection Time: 02/18/19 10:40 PM   Specimen: Nasopharyngeal Swab  Result Value Ref Range Status   SARS Coronavirus 2 NEGATIVE NEGATIVE Final    Comment: (NOTE) SARS-CoV-2 target nucleic acids are NOT DETECTED. The SARS-CoV-2 RNA is generally detectable  in upper and lower respiratory specimens during the acute phase of infection. Negative results do not preclude SARS-CoV-2 infection, do not rule out co-infections with other pathogens, and should not be used as the sole basis for treatment or other patient management decisions. Negative results must be combined with clinical observations, patient history, and epidemiological information. The expected result is Negative. Fact Sheet for Patients: HairSlick.no Fact Sheet for Healthcare Providers: quierodirigir.com This test is not yet approved or cleared by the [place] FDA and  has been authorized for detection and/or diagnosis of SARS-CoV-2 by FDA under an Emergency Use Authorization (EUA). This EUA will remain  in effect (meaning this test can be used) for the duration of the COVID-19 declaration under Section 56 4(b)(1) of the Act, 21 U.S.C. section 360bbb-3(b)(1), unless the authorization is terminated or revoked sooner. Performed at [facility], [address] [place], [place] [address]   MRSA PCR Screening     Status: None   Collection Time: 02/19/19  3:05 AM    Specimen: Nasopharyngeal  Result Value Ref Range Status   MRSA by PCR NEGATIVE NEGATIVE Final    Comment:        The GeneXpert MRSA Assay (FDA approved for NASAL specimens only), is one component of a comprehensive MRSA colonization surveillance program. It is not intended to diagnose MRSA infection nor to guide or monitor treatment for MRSA infections. Performed at [facility], [address], [place], [place] [address]   Culture, blood (routine x 2)     Status: None (Preliminary result)   Collection Time: 02/19/19  4:58 AM   Specimen: BLOOD RIGHT WRIST  Result Value Ref Range Status   Specimen Description BLOOD RIGHT WRIST  Final   Special Requests   Final    BOTTLES DRAWN AEROBIC ONLY Blood Culture adequate volume   Culture   Final    NO GROWTH 1 DAY Performed at [facility], [address], [place], [place] [address]    Report Status PENDING  Incomplete  Culture, blood (routine x 2)     Status: None (Preliminary result)   Collection Time: 02/19/19  5:10 AM   Specimen: BLOOD LEFT WRIST  Result Value Ref Range Status   Specimen Description BLOOD LEFT WRIST  Final   Special Requests   Final    BOTTLES DRAWN AEROBIC ONLY Blood Culture adequate volume   Culture   Final    NO GROWTH 1 DAY Performed at [facility], [address] [place], [place] [address]    Report Status PENDING  Incomplete  Respiratory Panel by PCR     Status: None   Collection Time: 02/19/19  4:09 PM   Specimen: Nasopharyngeal Swab; Respiratory  Result Value Ref Range Status   Adenovirus NOT DETECTED NOT DETECTED Final   Coronavirus 229E NOT DETECTED NOT DETECTED Final    Comment: (NOTE) The Coronavirus on the Respiratory Panel, DOES NOT test for the novel  Coronavirus (2019 nCoV)    Coronavirus HKU1 NOT DETECTED NOT DETECTED Final   Coronavirus NL63 NOT DETECTED NOT DETECTED Final   Coronavirus OC43 NOT DETECTED NOT DETECTED Final   Metapneumovirus NOT DETECTED NOT  DETECTED Final   Rhinovirus / Enterovirus NOT DETECTED NOT DETECTED Final   Influenza A NOT DETECTED NOT DETECTED Final   Influenza B NOT DETECTED NOT DETECTED Final   Parainfluenza Virus 1 NOT DETECTED NOT DETECTED Final   Parainfluenza Virus 2 NOT DETECTED NOT DETECTED Final  Parainfluenza Virus 3 NOT DETECTED NOT DETECTED Final   Parainfluenza Virus 4 NOT DETECTED NOT DETECTED Final   Respiratory Syncytial Virus NOT DETECTED NOT DETECTED Final   Bordetella pertussis NOT DETECTED NOT DETECTED Final   Chlamydophila pneumoniae NOT DETECTED NOT DETECTED Final   Mycoplasma pneumoniae NOT DETECTED NOT DETECTED Final    Comment: Performed at [facility], [address] [place], [place] [address]      Radiology Studies: Dg Chest Portable 1 View  Result Date: 02/18/2019 CLINICAL DATA:  Weakness, dyspnea, CHF EXAM: PORTABLE CHEST 1 VIEW COMPARISON:  05/15/2018 chest radiograph. FINDINGS: Intact sternotomy wires. CABG clips overlie the mediastinum. Aortic valve prosthesis in place. Stable cardiomediastinal silhouette with mild cardiomegaly. No pneumothorax. Slight blunting of the right costophrenic angle. No left pleural effusion. No overt pulmonary edema. No acute consolidative airspace disease. Faint reticular opacities at the lung bases appear unchanged. Surgical clips again noted in medial lower right neck. IMPRESSION: 1. Stable cardiomegaly without overt pulmonary edema. 2. Slight blunting of the right costophrenic angle, cannot exclude trace right pleural effusion. 3. Chronic faint reticular opacities at the lung bases, suggesting nonspecific mild pulmonary fibrosis. Electronically Signed   By: Delbert PhenixJason A Poff M.D.   On: 02/18/2019 20:05      Scheduled Meds: . amLODipine  5 mg Oral Daily  . aspirin EC  81 mg Oral Daily  . atorvastatin  20 mg Oral Daily  . carvedilol  6.25 mg Oral BID WC  . clopidogrel  75 mg Oral Daily  . enoxaparin (LOVENOX) injection  30 mg Subcutaneous Daily  .  furosemide  40 mg Intravenous BID  . levothyroxine  100 mcg Oral QAC breakfast  . losartan  25 mg Oral Daily  . Melatonin  6 mg Oral QHS  . [START ON 02/22/2019] methotrexate  25 mg Oral Q Fri  . predniSONE  5 mg Oral Daily  . sodium chloride flush  3 mL Intravenous Q12H   Continuous Infusions: . sodium chloride       LOS: 1 day      Time spent: 35 minutes   Noralee StainJennifer Charlina Dwight, DO [facility] 02/20/2019, 11:40 AM   Available via Epic secure chat 7am-7pm After these hours, please refer to coverage provider listed on [email or web address]

## 2019-02-21 LAB — BASIC METABOLIC PANEL
Anion gap: 14 (ref 5–15)
BUN: 59 mg/dL — ABNORMAL HIGH (ref 8–23)
CO2: 24 mmol/L (ref 22–32)
Calcium: 8.3 mg/dL — ABNORMAL LOW (ref 8.9–10.3)
Chloride: 96 mmol/L — ABNORMAL LOW (ref 98–111)
Creatinine, Ser: 1.88 mg/dL — ABNORMAL HIGH (ref 0.61–1.24)
GFR calc Af Amer: 36 mL/min — ABNORMAL LOW (ref >60)
GFR calc non Af Amer: 31 mL/min — ABNORMAL LOW (ref >60)
Glucose, Bld: 97 mg/dL (ref 70–99)
Potassium: 4 mmol/L (ref 3.5–5.1)
Sodium: 134 mmol/L — ABNORMAL LOW (ref 135–145)

## 2019-02-21 MED ORDER — FUROSEMIDE 40 MG PO TABS: 40.0000 mg | ORAL_TABLET | Freq: Every day | ORAL | Status: DC

## 2019-02-21 MED ORDER — INFLUENZA VAC A&B SA ADJ QUAD 0.5 ML IM PRSY
0.5000 mL | PREFILLED_SYRINGE | INTRAMUSCULAR | Status: AC
  Administered 2019-02-22: 0.5 mL via INTRAMUSCULAR
  Filled 2019-02-21: qty 0.5

## 2019-02-21 NOTE — Plan of Care (Signed)
  Problem: Education: Goal: Knowledge of General Education information will improve Description Including pain rating scale, medication(s)/side effects and non-pharmacologic comfort measures Outcome: Progressing   Problem: Health Behavior/Discharge Planning: Goal: Ability to manage health-related needs will improve Outcome: Progressing   Problem: Clinical Measurements: Goal: Ability to maintain clinical measurements within normal limits will improve Outcome: Progressing Goal: Will remain free from infection Outcome: Progressing Goal: Diagnostic test results will improve Outcome: Progressing Goal: Respiratory complications will improve Outcome: Progressing Goal: Cardiovascular complication will be avoided Outcome: Progressing   Problem: Activity: Goal: Risk for activity intolerance will decrease Outcome: Progressing   Problem: Nutrition: Goal: Adequate nutrition will be maintained Outcome: Progressing   Problem: Coping: Goal: Level of anxiety will decrease Outcome: Progressing   Problem: Elimination: Goal: Will not experience complications related to bowel motility Outcome: Progressing Goal: Will not experience complications related to urinary retention Outcome: Progressing   Problem: Pain Managment: Goal: General experience of comfort will improve Outcome: Progressing   Problem: Skin Integrity: Goal: Risk for impaired skin integrity will decrease Outcome: Progressing   Problem: Education: Goal: Ability to demonstrate management of disease process will improve Outcome: Progressing Goal: Ability to verbalize understanding of medication therapies will improve Outcome: Progressing Goal: Individualized Educational Video(s) Outcome: Progressing   Problem: Activity: Goal: Capacity to carry out activities will improve Outcome: Progressing   Problem: Cardiac: Goal: Ability to achieve and maintain adequate cardiopulmonary perfusion will improve Outcome:  Progressing   

## 2019-02-21 NOTE — Progress Notes (Signed)
Patient refused lab draw. He became very aggressive and swung at lab technician. Will ask technician to come back when patient is more awake

## 2019-02-21 NOTE — Progress Notes (Signed)
PROGRESS NOTE    Hunter Choi  MGN:[id number] DOB: 1927/11/18 DOA: 02/18/2019 PCP: [name], MD     Brief Narrative:  Hunter Choi is a 85 y.o. male with PMH significant for CAD, s/p TAVR, CHF, CVA, hypothyroidism who presents to ER for worsening dyspnea. Patient is accompanied by his son, [name], who is his full time caretaker. Increased WOB noted over the past 3 days. Had a 5lb weight gain. Son increased home Lasix 40 mg qd to 40 mg BID. Has noted increased edema to LE bilaterally. Patient has had diminished appetite recently and some generalized weakness. While in the ED, son noted that patient began to exhibit signs of confusion. Has referred to his son by the wrong name and at one point thought his son was his nurse. Son worried because his father is normally very mentally intact and has also been confused when he has been admitted to the hospital for CHF exacerbations in the past, most recently in February.   New events last 24 hours / Subjective: Patient without any complaints today.  Denies any shortness of breath.  He remains oriented to self and place but not to year.  Assessment & Plan:   Active Problems:   Rheumatoid arthritis (HCC)   Acute on chronic combined systolic and diastolic congestive heart failure, NYHA class 4 (HCC)   CKD (chronic kidney disease) stage 3, GFR 30-59 ml/min   Acute metabolic encephalopathy   Hypothyroidism   Shortness of breath   Acute on chronic combined systolic and diastolic CHF:  -Patient with reported dyspnea and increased work of breathing at home. Has some labored breathing on exam and mild crackles in bases, no hypoxia present. Patient with reported weight gain at home of 5lbs despite diminished appetite. LE edema noted. BNP 2601. CXR with ?trace right pleural effusion, no overt pulmonary edema. BNP has previously been as high as 4500. Value obtained this admission does appear to be higher than baseline. Level is chronically  elevated from review of prior labs. Last echo with EF 30-35% in Feb 2020.  -Continue Lasix, switch to p.o. tomorrow -Daily weights, strict I/Os  -I/O not accurately measured. Weight down 152-->150lb   Acute metabolic encephalopathy:  -May be related to delirium/sun downing as started in the evening after several hours in the hospital.  -Vit B 12: 1277 -RPR NR  -Ammonia 14  -HIV NR  -Seems to be improved, continue to monitor -Delirium precaution   CKD Stage IIIb -Baseline Cr ~1.2-1.5 -Mildly increased today 1.88.  Stop afternoon Lasix.  Patient had already received Cozaar this morning.  Continue to monitor BMP  Hypothyroidism:  -TSH 4.539, free T4 1.43. Repeat labs in 4-6 weeks as outpatient  -Continue Synthroid 100 mcg   HTN: -Continue norvasc, coreg, cozaar   Rheumatoid arthritis:  -Continue home methotrexate and prednisone   History of CVA:  -Continue Lipitor, Plavix and ASA   CAD s/p CABG:   -Continue Coreg, Lipitor, ASA, Plavix    In agreement with assessment of the pressure ulcer as below:  Pressure Ulcer 01/27/15 Stage I -  Intact skin with non-blanchable redness of a localized area usually over a bony prominence. redness. no open areas (Active)  01/27/15 1249  Location: Sacrum  Location Orientation: Left;Right  Staging: Stage I -  Intact skin with non-blanchable redness of a localized area usually over a bony prominence.  Wound Description (Comments): redness. no open areas  Present on Admission: Yes  DVT prophylaxis: SCD Code Status: Full Family Communication: None at bedside, discussed with son over the phone Disposition Plan: Hold afternoon Lasix dose and monitor BMP.  Hopeful discharge home 11/27   Consultants:   None  Procedures:   None   Antimicrobials:  Anti-infectives (From admission, onward)   None       Objective: Vitals:   02/20/19 2312 02/21/19 0351 02/21/19 0700 02/21/19 0900  BP: (!) 109/50 (!) 114/46 (!)  107/53   Pulse: (!) 55 (!) 59 (!) 56 (!) 57  Resp: 20 18 18 13   Temp: 98 F (36.7 C) 97.8 F (36.6 C) 98.1 F (36.7 C)   TempSrc: Oral Oral Oral   SpO2: 99% 99% 96% 96%  Weight:  68.4 kg    Height:        Intake/Output Summary (Last 24 hours) at 02/21/2019 1025 Last data filed at 02/21/2019 0953 Gross per 24 hour  Intake 630 ml  Output 350 ml  Net 280 ml   Filed Weights   02/19/19 0200 02/20/19 0500 02/21/19 0351  Weight: 69.3 kg 67.4 kg 68.4 kg    Examination: General exam: Appears calm and comfortable  Respiratory system: Clear to auscultation. Respiratory effort normal.  On room air Cardiovascular system: S1 & S2 heard, bradycardic, regular rhythm. No pedal edema. Gastrointestinal system: Abdomen is nondistended, soft and nontender. Normal bowel sounds heard. Central nervous system: Alert and oriented to person and place. Non focal exam. Speech clear  Extremities: Symmetric in appearance bilaterally  Skin: No rashes, lesions or ulcers on exposed skin  Psychiatry: Stable   Data Reviewed: I have personally reviewed following labs and imaging studies  CBC: Recent Labs  Lab 02/18/19 1639 02/19/19 0458  WBC 10.8* 8.9  HGB 9.7* 9.6*  HCT 32.2* 31.1*   30.7*  MCV 89.9 88.4  PLT 147* 145*   Basic Metabolic Panel: Recent Labs  Lab 02/18/19 1639 02/19/19 1400 02/20/19 0852 02/21/19 0716  NA 134* 137 134* 134*  K 4.7 3.8 3.8 4.0  CL 100 101 98 96*  CO2 24 25 22 24   GLUCOSE 137* 84 114* 97  BUN 50* 49* 51* 59*  CREATININE 1.63* 1.39* 1.68* 1.88*  CALCIUM 9.0 8.9 8.7* 8.3*  PHOS  --  4.3  --   --    GFR: Estimated Creatinine Clearance: 24.9 mL/min (A) (by C-G formula based on SCr of 1.88 mg/dL (H)). Liver Function Tests: Recent Labs  Lab 02/19/19 1400  ALBUMIN 3.5   No results for input(s): LIPASE, AMYLASE in the last 168 hours. Recent Labs  Lab 02/19/19 0458  AMMONIA 14   Coagulation Profile: No results for input(s): INR, PROTIME in the last 168  hours. Cardiac Enzymes: No results for input(s): CKTOTAL, CKMB, CKMBINDEX, TROPONINI in the last 168 hours. BNP (last 3 results) No results for input(s): PROBNP in the last 8760 hours. HbA1C: No results for input(s): HGBA1C in the last 72 hours. CBG: No results for input(s): GLUCAP in the last 168 hours. Lipid Profile: No results for input(s): CHOL, HDL, LDLCALC, TRIG, CHOLHDL, LDLDIRECT in the last 72 hours. Thyroid Function Tests: Recent Labs    02/19/19 0458 02/19/19 1400  TSH 4.539*  --   FREET4  --  1.43*  T3FREE  --  1.8*   Anemia Panel: Recent Labs    02/19/19 0458  VITAMINB12 1,277*   Sepsis Labs: No results for input(s): PROCALCITON, LATICACIDVEN in the last 168 hours.  Recent Results (from the past 240 hour(s))  SARS  CORONAVIRUS 2 (TAT 6-24 HRS) Nasopharyngeal Nasopharyngeal Swab     Status: None   Collection Time: 02/18/19 10:40 PM   Specimen: Nasopharyngeal Swab  Result Value Ref Range Status   SARS Coronavirus 2 NEGATIVE NEGATIVE Final    Comment: (NOTE) SARS-CoV-2 target nucleic acids are NOT DETECTED. The SARS-CoV-2 RNA is generally detectable in upper and lower respiratory specimens during the acute phase of infection. Negative results do not preclude SARS-CoV-2 infection, do not rule out co-infections with other pathogens, and should not be used as the sole basis for treatment or other patient management decisions. Negative results must be combined with clinical observations, patient history, and epidemiological information. The expected result is Negative. Fact Sheet for Patients: SugarRoll.be Fact Sheet for Healthcare Providers: https://www.woods-mathews.com/ This test is not yet approved or cleared by the [place] FDA and  has been authorized for detection and/or diagnosis of SARS-CoV-2 by FDA under an Emergency Use Authorization (EUA). This EUA will remain  in effect (meaning this test can be used)  for the duration of the COVID-19 declaration under Section 56 4(b)(1) of the Act, 21 U.S.C. section 360bbb-3(b)(1), unless the authorization is terminated or revoked sooner. Performed at [facility], [place] [address], [place], [place] [address]   MRSA PCR Screening     Status: None   Collection Time: 02/19/19  3:05 AM   Specimen: Nasopharyngeal  Result Value Ref Range Status   MRSA by PCR NEGATIVE NEGATIVE Final    Comment:        The GeneXpert MRSA Assay (FDA approved for NASAL specimens only), is one component of a comprehensive MRSA colonization surveillance program. It is not intended to diagnose MRSA infection nor to guide or monitor treatment for MRSA infections. Performed at [facility], [place] [address], [place], [place] [address]   Culture, blood (routine x 2)     Status: None (Preliminary result)   Collection Time: 02/19/19  4:58 AM   Specimen: BLOOD RIGHT WRIST  Result Value Ref Range Status   Specimen Description BLOOD RIGHT WRIST  Final   Special Requests   Final    BOTTLES DRAWN AEROBIC ONLY Blood Culture adequate volume   Culture   Final    NO GROWTH 1 DAY Performed at [facility], [place] [address] [place], [place] [address]    Report Status PENDING  Incomplete  Culture, blood (routine x 2)     Status: None (Preliminary result)   Collection Time: 02/19/19  5:10 AM   Specimen: BLOOD LEFT WRIST  Result Value Ref Range Status   Specimen Description BLOOD LEFT WRIST  Final   Special Requests   Final    BOTTLES DRAWN AEROBIC ONLY Blood Culture adequate volume   Culture   Final    NO GROWTH 1 DAY Performed at [facility], [place] [address] [place], [place] [address]    Report Status PENDING  Incomplete  Respiratory Panel by PCR     Status: None   Collection Time: 02/19/19  4:09 PM   Specimen: Nasopharyngeal Swab; Respiratory  Result Value Ref Range Status   Adenovirus NOT DETECTED NOT DETECTED Final   Coronavirus 229E NOT DETECTED  NOT DETECTED Final    Comment: (NOTE) The Coronavirus on the Respiratory Panel, DOES NOT test for the novel  Coronavirus (2019 nCoV)    Coronavirus HKU1 NOT DETECTED NOT DETECTED Final   Coronavirus NL63 NOT DETECTED NOT DETECTED Final   Coronavirus OC43 NOT DETECTED NOT  DETECTED Final   Metapneumovirus NOT DETECTED NOT DETECTED Final   Rhinovirus / Enterovirus NOT DETECTED NOT DETECTED Final   Influenza A NOT DETECTED NOT DETECTED Final   Influenza B NOT DETECTED NOT DETECTED Final   Parainfluenza Virus 1 NOT DETECTED NOT DETECTED Final   Parainfluenza Virus 2 NOT DETECTED NOT DETECTED Final   Parainfluenza Virus 3 NOT DETECTED NOT DETECTED Final   Parainfluenza Virus 4 NOT DETECTED NOT DETECTED Final   Respiratory Syncytial Virus NOT DETECTED NOT DETECTED Final   Bordetella pertussis NOT DETECTED NOT DETECTED Final   Chlamydophila pneumoniae NOT DETECTED NOT DETECTED Final   Mycoplasma pneumoniae NOT DETECTED NOT DETECTED Final    Comment: Performed at [facility], [address] [place], [place] [address]      Radiology Studies: No results found.    Scheduled Meds:  amLODipine  5 mg Oral Daily   aspirin EC  81 mg Oral Daily   atorvastatin  20 mg Oral Daily   carvedilol  6.25 mg Oral BID WC   clopidogrel  75 mg Oral Daily   [START ON 02/22/2019] furosemide  40 mg Oral Daily   levothyroxine  100 mcg Oral QAC breakfast   losartan  25 mg Oral Daily   Melatonin  6 mg Oral QHS   [START ON 02/22/2019] methotrexate  25 mg Oral Q Fri   predniSONE  5 mg Oral Daily   sodium chloride flush  3 mL Intravenous Q12H   Continuous Infusions:  sodium chloride       LOS: 2 days      Time spent: 25 minutes   Noralee StainJennifer Darya Bigler, DO [facility] 02/21/2019, 10:25 AM   Available via Epic secure chat 7am-7pm After these hours, please refer to coverage provider listed on [email or web address]

## 2019-02-22 LAB — BASIC METABOLIC PANEL
Anion gap: 13 (ref 5–15)
BUN: 62 mg/dL — ABNORMAL HIGH (ref 8–23)
CO2: 23 mmol/L (ref 22–32)
Calcium: 8 mg/dL — ABNORMAL LOW (ref 8.9–10.3)
Chloride: 96 mmol/L — ABNORMAL LOW (ref 98–111)
Creatinine, Ser: 1.81 mg/dL — ABNORMAL HIGH (ref 0.61–1.24)
GFR calc Af Amer: 38 mL/min — ABNORMAL LOW (ref >60)
GFR calc non Af Amer: 32 mL/min — ABNORMAL LOW (ref >60)
Glucose, Bld: 101 mg/dL — ABNORMAL HIGH (ref 70–99)
Potassium: 4 mmol/L (ref 3.5–5.1)
Sodium: 132 mmol/L — ABNORMAL LOW (ref 135–145)

## 2019-02-22 NOTE — TOC Initial Note (Addendum)
Transition of Care [facility]) - Initial/Assessment Note    Patient Details  Name: Hunter Choi MRN: [id number] Date of Birth: [birth year]  Transition [facility] CM/SW Contact:    Gildardo Griffes, [place] Phone Number: [phone] 02/22/2019, 8:42 AM  Clinical Narrative:                  CSW spoke to patient's son [name] regarding discharge planning.  He reports he is full time time caregiver and reports no home needs at this time and no equipment needs. He reports he will be picking patient up at 2pm today.   CSW has informed RN of pick up time.   Please contact CSW should needs arrive prior to dc.   Expected Discharge Plan: Home/Self Care Barriers to Discharge: No Barriers Identified   Patient Goals and CMS Choice Patient states their goals for this hospitalization and ongoing recovery are:: to go home CMS Medicare.gov Compare Post Acute Care list provided to:: Patient Represenative (must comment)(son [name]) Choice offered to / list presented to : Adult Children  Expected Discharge Plan and Services Expected Discharge Plan: Home/Self Care       Living arrangements for the past 2 months: Single Family Home Expected Discharge Date: 02/22/19                                    Prior Living Arrangements/Services Living arrangements for the past 2 months: Single Family Home Lives with:: Adult Children Patient language and need for interpreter reviewed:: Yes Do you feel safe going back to the place where you live?: Yes      Need for Family Participation in Patient Care: Yes (Comment) Care giver support system in place?: Yes (comment)   Criminal Activity/Legal Involvement Pertinent to Current Situation/Hospitalization: No - Comment as needed  Activities of Daily Living Home Assistive Devices/Equipment: None ADL Screening (condition at time of admission) Patient's cognitive ability adequate to safely complete daily activities?: Yes Is the patient deaf or have difficulty  hearing?: Yes Does the patient have difficulty seeing, even when wearing glasses/contacts?: No Does the patient have difficulty concentrating, remembering, or making decisions?: Yes Patient able to express need for assistance with ADLs?: Yes Does the patient have difficulty dressing or bathing?: No Independently performs ADLs?: No Communication: Independent Dressing (OT): Needs assistance Is this a change from baseline?: Pre-admission baseline Grooming: Needs assistance Is this a change from baseline?: Pre-admission baseline Feeding: Independent Bathing: Needs assistance Is this a change from baseline?: Pre-admission baseline Toileting: Independent In/Out Bed: Needs assistance Is this a change from baseline?: Pre-admission baseline Walks in Home: Independent Does the patient have difficulty walking or climbing stairs?: Yes Weakness of Legs: Both Weakness of Arms/Hands: None  Permission Sought/Granted Permission sought to share information with : Case Manager, Magazine features editor, Family Supports Permission granted to share information with : Yes, Verbal Permission Granted  Share Information with NAME: [name]     Permission granted to share info w Relationship: son  Permission granted to share info w Contact Information: [phone]  Emotional Assessment Appearance:: Appears stated age Attitude/Demeanor/Rapport: Unable to Assess Affect (typically observed): Unable to Assess Orientation: : Oriented to Self, Oriented to Place Alcohol / Substance Use: Not Applicable Psych Involvement: No (comment)  Admission diagnosis:  Shortness of breath [R06.02] Patient Active Problem List   Diagnosis Date Noted  . Shortness of breath   . Acute exacerbation of CHF (congestive heart  failure) ([place]) 05/12/2018  . Nausea vomiting and diarrhea 04/22/2018  . B12 deficiency 03/27/2018  . Hypothyroidism 03/27/2018  . Chronic combined systolic and diastolic CHF (congestive heart  failure) ([place]) 03/27/2018  . Protein-calorie malnutrition, severe 03/24/2018  . Generalized weakness   . Acute metabolic encephalopathy 76/81/1572  . CKD (chronic kidney disease) stage 3, GFR 30-59 ml/min 12/09/2017  . SIRS (systemic inflammatory response syndrome) ([place]) 08/03/2017  . Essential hypertension 08/03/2017  . Elevated troponin   . Sepsis due to urinary tract infection ([place]) 10/02/2015  . Hard of hearing   . Pressure ulcer 01/28/2015  . Acute on chronic combined systolic and diastolic congestive heart failure, NYHA class 4 ([place]) 01/27/2015  . Hyponatremia 11/21/2014  . Elevated LFTs 11/14/2014  . Anemia 05/23/2014  . Thrombocytopenia ([place]) 02/21/2014  . Malnutrition of moderate degree ([place]) 02/21/2014  . Occlusion and stenosis of carotid artery without mention of cerebral infarction 09/04/2013  . Aftercare following surgery of the circulatory system, [place] 09/04/2013  . AAA (abdominal aortic aneurysm) without rupture ([place]) 03/06/2013  . 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 Coronary artery disease involving coronary bypass graft of native heart with angina pectoris ([place])   . GERD (gastroesophageal reflux disease)   . Thyroid disease   . Embolism involving retinal artery   . Iliac artery aneurysm ([place])   . Rheumatoid arthritis ([place]) 04/22/2010   PCP:  Leeroy Cha, MD Pharmacy:   [facility] [place], [place] [address] Phone: [phone] Fax: [phone]  [id number] [place], [place] [address] Phone: [phone] Fax: [phone]     Social Determinants of Health ([place]) Interventions    Readmission Risk Interventions No flowsheet data found.

## 2019-02-22 NOTE — Plan of Care (Signed)

## 2019-02-22 NOTE — Progress Notes (Signed)
Patient discharge instructions given to patients son, [name]. All questions were answered, follow up appointments understood. IV removed clean dry intact.

## 2019-02-22 NOTE — Progress Notes (Signed)
Physical Therapy Treatment Patient Details Name: Hunter Choi MRN: [id number] DOB: [birth year] Today's Date: 02/22/2019    History of Present Illness 85 y.o. male with PMH significant for CAD, s/p TAVR, CHF, CVA, hypothyroidism who presents to ER for worsening dyspnea, LE edema, weakness, and AMS.     PT Comments    Pt tolerated treatment well, transferring multiple times and performing sit to stands to aide in cleaning after bowel movement. Pt demonstrates reduced LE strength and decreased stability when transferring to R side and will benefit from continued transfer training and balance training to improve ability to transfer toward both sides and reduce falls risk.    Follow Up Recommendations  No PT follow up;Supervision/Assistance - 24 hour     Equipment Recommendations  None recommended by PT    Recommendations for Other Services       Precautions / Restrictions Precautions Precautions: Fall Restrictions Weight Bearing Restrictions: No    Mobility  Bed Mobility Overal bed mobility: Needs Assistance Bed Mobility: Supine to Sit     Supine to sit: Min assist        Transfers Overall transfer level: Needs assistance Equipment used: None Transfers: Scientist, clinical (histocompatibility and immunogenetics) Transfers;Sit to/from Stand Sit to Stand: Min assist   Squat pivot transfers: Min assist     General transfer comment: pt pulling on armrests. Requiring minA from recliner to [facility]. Pt needing minG from bed to recliner and BSC to recliner.  Ambulation/Gait                 Stairs             Wheelchair Mobility    Modified Rankin (Stroke Patients Only)       Balance Overall balance assessment: Needs assistance Sitting-balance support: Bilateral upper extremity supported;Feet supported Sitting balance-Leahy Scale: Good Sitting balance - Comments: supervision   Standing balance support: Bilateral upper extremity supported Standing balance-Leahy Scale: Fair Standing balance  comment: minA with BUE support of RW                            Cognition Arousal/Alertness: Awake/alert Behavior During Therapy: WFL for tasks assessed/performed Overall Cognitive Status: Within Functional Limits for tasks assessed                                        Exercises      General Comments General comments (skin integrity, edema, etc.): VSS on room air      Pertinent Vitals/Pain Pain Assessment: No/denies pain    Home Living                      Prior Function            PT Goals (current goals can now be found in the care plan section) Acute Rehab PT Goals Patient Stated Goal: To go home Progress towards PT goals: Progressing toward goals    Frequency    Min 3X/week      PT Plan Current plan remains appropriate    Co-evaluation              AM-PAC PT "6 Clicks" Mobility   Outcome Measure  Help needed turning from your back to your side while in a flat bed without using bedrails?: None Help needed moving from lying on your back to sitting on the  side of a flat bed without using bedrails?: A Little Help needed moving to and from a bed to a chair (including a wheelchair)?: A Little Help needed standing up from a chair using your arms (e.g., wheelchair or bedside chair)?: A Little Help needed to walk in hospital room?: Total Help needed climbing 3-5 steps with a railing? : Total 6 Click Score: 15    End of Session Equipment Utilized During Treatment: (none) Activity Tolerance: Patient tolerated treatment well Patient left: in chair;with call bell/phone within reach;with chair alarm set Nurse Communication: Mobility status PT Visit Diagnosis: Muscle weakness (generalized) (M62.81)     Time: 9563-8756 PT Time Calculation (min) (ACUTE ONLY): 23 min  Charges:  $Therapeutic Activity: 23-37 mins                     Zenaida Niece, PT, DPT Acute Rehabilitation Pager: [phone]    Zenaida Niece 02/22/2019, 12:20 PM

## 2019-02-22 NOTE — Discharge Summary (Signed)
Physician Discharge Summary  Hunter Choi ZOX:[id number] DOB: [birth year] DOA: 02/18/2019  PCP: [name], MD  Admit date: 02/18/2019 Discharge date: 02/22/2019  Admitted From: Home Disposition:  Home  Recommendations for Outpatient Follow-up:  1. Follow up with PCP in 1 week 2. Follow up with Cardiology in 1-2 weeks 3. Please obtain BMP in 1 week to check Cr   Discharge Condition: Stable CODE STATUS: Full  Diet recommendation: Heart healthy   Brief/Interim Summary: Hunter Choi is a 85 y.o.male with PMH significant for CAD, s/p TAVR, CHF, CVA, hypothyroidism who presents to ER for worsening dyspnea. Patient is accompanied by his son, Hunter Choi, who is his full time caretaker. Increased WOB noted over the past 3 days. Had a 5lb weight gain. Son increased home Lasix 40 mg qd to 40 mg BID. Has noted increased edema to LE bilaterally. Patient has had diminished appetite recently and some generalized weakness. While in the ED, son noted that patient began to exhibit signs of confusion. Has referred to his son by the wrong name and at one point thought his son was his nurse. Son worried because his father is normally very mentally intact and has also been confused when he has been admitted to the hospital for CHF exacerbations in the past, most recently in February.  He was treated with IV lasix with improvement. His encephalopathy improved, although there is likely component of dementia with sundowning present. On day of discharge, he was feeling well without any complaints. He remained stable on room air without peripheral edema.   Discharge Diagnoses:  Active Problems:   Rheumatoid arthritis (HCC)   Acute on chronic combined systolic and diastolic congestive heart failure, NYHA class 4 (HCC)   CKD (chronic kidney disease) stage 3, GFR 30-59 ml/min   Acute metabolic encephalopathy   Hypothyroidism   Shortness of breath   Acute on chronic combined systolic and diastolic CHF:   -Patient with reported dyspnea and increased work of breathing at home. Has some labored breathing on exam and mild crackles in bases, no hypoxia present. Patient with reported weight gain at home of 5lbs despite diminished appetite. LE edema noted. BNP 2601. CXR with ?trace right pleural effusion, no overt pulmonary edema. BNP has previously been as high as 4500. Value obtained this admission does appear to be higher than baseline. Level is chronically elevated from review of prior labs. Last echo with EF 30-35% in Feb 2020. -IV --> PO lasix  -Daily weights, strict I/Os  -I/O not accurately measured but patient does not have any peripheral edema present.   Acute metabolic encephalopathy:  -May be related to delirium/sun downing as started in the evening after several hours in the hospital.  -Vit B 12: 1277 -RPR NR  -Ammonia 14  -HIV NR  -Seems to be improved, continue to monitor -Delirium precaution   CKD Stage IIIb -Baseline Cr ~1.2-1.5 -Cr 1.63 --> 1.39 --> 1.68 --> 1.88 --> 1.81  -Held cozaar 11/27. Repeat BMP as outpatient   Hypothyroidism:  -TSH 4.539, free T4 1.43. Repeat labs in 4-6 weeks as outpatient  -Continue Synthroid 100 mcg   HTN: -Continue norvasc, coreg, cozaar   Rheumatoid arthritis:  -Continue home methotrexate and prednisone   History of CVA:  -Continue Lipitor, Plavix and ASA   CAD s/p CABG:   -Continue Coreg, Lipitor, ASA, Plavix    Discharge Instructions  Discharge Instructions    (HEART FAILURE PATIENTS) Call MD:  Anytime you have any of the following symptoms: 1)  3 pound weight gain in 24 hours or 5 pounds in 1 week 2) shortness of breath, with or without a dry hacking cough 3) swelling in the hands, feet or stomach 4) if you have to sleep on extra pillows at night in order to breathe.   Complete by: As directed    Call MD for:  difficulty breathing, headache or visual disturbances   Complete by: As directed    Call MD for:  extreme  fatigue   Complete by: As directed    Call MD for:  persistant dizziness or light-headedness   Complete by: As directed    Call MD for:  persistant nausea and vomiting   Complete by: As directed    Call MD for:  severe uncontrolled pain   Complete by: As directed    Call MD for:  temperature >100.4   Complete by: As directed    Diet - low sodium heart healthy   Complete by: As directed    Discharge instructions   Complete by: As directed    You were cared for by a hospitalist during your hospital stay. If you have any questions about your discharge medications or the care you received while you were in the hospital after you are discharged, you can call the unit and ask to speak with the hospitalist on call if the hospitalist that took care of you is not available. Once you are discharged, your primary care physician will handle any further medical issues. Please note that NO REFILLS for any discharge medications will be authorized once you are discharged, as it is imperative that you return to your primary care physician (or establish a relationship with a primary care physician if you do not have one) for your aftercare needs so that they can reassess your need for medications and monitor your lab values.   Increase activity slowly   Complete by: As directed      Allergies as of 02/22/2019      Reactions   Ciprofloxacin Other (See Comments)   REACTION: mild delirium      Medication List    TAKE these medications   acetaminophen 500 MG tablet Commonly known as: TYLENOL Take 1,000 mg by mouth 2 (two) times daily.   amLODipine 5 MG tablet Commonly known as: NORVASC Take 5 mg by mouth daily.   aspirin 81 MG EC tablet Take 1 tablet (81 mg total) by mouth daily.   atorvastatin 20 MG tablet Commonly known as: LIPITOR Take 20 mg by mouth daily.   carvedilol 6.25 MG tablet Commonly known as: COREG Take 1 tablet (6.25 mg total) by mouth 2 (two) times daily with a meal.    clopidogrel 75 MG tablet Commonly known as: PLAVIX TAKE 1 TABLET BY MOUTH EVERY DAY   feeding supplement (ENSURE ENLIVE) Liqd Take 237 mLs by mouth 3 (three) times daily between meals.   fluticasone 50 MCG/ACT nasal spray Commonly known as: FLONASE Place 1 spray into both nostrils at bedtime as needed for allergies or rhinitis.   folic acid 1 MG tablet Commonly known as: FOLVITE Take 3 mg by mouth daily.   furosemide 20 MG tablet Commonly known as: LASIX TAKE 2 TABLETS BY MOUTH EVERY DAY   levothyroxine 100 MCG tablet Commonly known as: SYNTHROID Take 100 mcg by mouth daily before breakfast.   losartan 25 MG tablet Commonly known as: COZAAR Take 25 mg by mouth daily.   methotrexate 2.5 MG tablet Take 25 mg by mouth every  Friday.   multivitamin with minerals Tabs tablet Take 1 tablet by mouth daily.   nitroGLYCERIN 0.4 MG SL tablet Commonly known as: NITROSTAT Place 0.4 mg under the tongue every 5 (five) minutes as needed for chest pain.   predniSONE 5 MG tablet Commonly known as: DELTASONE Take 5 mg by mouth daily.   TUMS PO Take 1 tablet by mouth 2 (two) times daily as needed (heartburn/indigestion).   vitamin B-12 1000 MCG tablet Commonly known as: CYANOCOBALAMIN Take 1 tablet (1,000 mcg total) by mouth daily.      Follow-up Information    Lorenda IshiharaVaradarajan, [name], MD. Schedule an appointment as soon as possible for a visit in 1 week(s).   Specialty: Internal Medicine Contact information: [address] [place] [address] [phone]        Lyn RecordsSmith, Henry W, MD. Schedule an appointment as soon as possible for a visit in 1 week(s).   Specialty: Cardiology Contact information: [address] [place] [address] [phone]          Allergies  Allergen Reactions  . Ciprofloxacin Other (See Comments)    REACTION: mild delirium      Procedures/Studies: Dg Chest Portable 1 View  Result Date:  02/18/2019 CLINICAL DATA:  Weakness, dyspnea, CHF EXAM: PORTABLE CHEST 1 VIEW COMPARISON:  05/15/2018 chest radiograph. FINDINGS: Intact sternotomy wires. CABG clips overlie the mediastinum. Aortic valve prosthesis in place. Stable cardiomediastinal silhouette with mild cardiomegaly. No pneumothorax. Slight blunting of the right costophrenic angle. No left pleural effusion. No overt pulmonary edema. No acute consolidative airspace disease. Faint reticular opacities at the lung bases appear unchanged. Surgical clips again noted in medial lower right neck. IMPRESSION: 1. Stable cardiomegaly without overt pulmonary edema. 2. Slight blunting of the right costophrenic angle, cannot exclude trace right pleural effusion. 3. Chronic faint reticular opacities at the lung bases, suggesting nonspecific mild pulmonary fibrosis. Electronically Signed   By: Delbert PhenixJason A Poff M.D.   On: 02/18/2019 20:05       Discharge Exam: Vitals:   02/22/19 0319 02/22/19 0701  BP: (!) 111/51 (!) 99/44  Pulse: (!) 59 (!) 59  Resp: 20 17  Temp: 98.7 F (37.1 C) 98.1 F (36.7 C)  SpO2: 96% 100%     General: Pt is alert, awake, not in acute distress Cardiovascular: bradycardic rate, reg rhythm, S1/S2 +, no edema Respiratory: CTA bilaterally, no wheezing, no rhonchi, no respiratory distress, no conversational dyspnea, on room air  Abdominal: Soft, NT, ND, bowel sounds + Extremities: no edema, no cyanosis Psych: stable     The results of significant diagnostics from this hospitalization (including imaging, microbiology, ancillary and laboratory) are listed below for reference.     Microbiology: Recent Results (from the past 240 hour(s))  SARS CORONAVIRUS 2 (TAT 6-24 HRS) Nasopharyngeal Nasopharyngeal Swab     Status: None   Collection Time: 02/18/19 10:40 PM   Specimen: Nasopharyngeal Swab  Result Value Ref Range Status   SARS Coronavirus 2 NEGATIVE NEGATIVE Final    Comment: (NOTE) SARS-CoV-2 target nucleic acids  are NOT DETECTED. The SARS-CoV-2 RNA is generally detectable in upper and lower respiratory specimens during the acute phase of infection. Negative results do not preclude SARS-CoV-2 infection, do not rule out co-infections with other pathogens, and should not be used as the sole basis for treatment or other patient management decisions. Negative results must be combined with clinical observations, patient history, and epidemiological information. The expected result is Negative. Fact Sheet for  Patients: HairSlick.no Fact Sheet for Healthcare Providers: quierodirigir.com This test is not yet approved or cleared by the [place] FDA and  has been authorized for detection and/or diagnosis of SARS-CoV-2 by FDA under an Emergency Use Authorization (EUA). This EUA will remain  in effect (meaning this test can be used) for the duration of the COVID-19 declaration under Section 56 4(b)(1) of the Act, 21 U.S.C. section 360bbb-3(b)(1), unless the authorization is terminated or revoked sooner. Performed at [facility], [address], [place], [place] [address]   MRSA PCR Screening     Status: None   Collection Time: 02/19/19  3:05 AM   Specimen: Nasopharyngeal  Result Value Ref Range Status   MRSA by PCR NEGATIVE NEGATIVE Final    Comment:        The GeneXpert MRSA Assay (FDA approved for NASAL specimens only), is one component of a comprehensive MRSA colonization surveillance program. It is not intended to diagnose MRSA infection nor to guide or monitor treatment for MRSA infections. Performed at [facility], [address], [place], [place] [address]   Culture, blood (routine x 2)     Status: None (Preliminary result)   Collection Time: 02/19/19  4:58 AM   Specimen: BLOOD RIGHT WRIST  Result Value Ref Range Status   Specimen Description BLOOD RIGHT WRIST  Final   Special Requests   Final    BOTTLES DRAWN  AEROBIC ONLY Blood Culture adequate volume   Culture   Final    NO GROWTH 2 DAYS Performed at [facility], [address], [place], [place] [address]    Report Status PENDING  Incomplete  Culture, blood (routine x 2)     Status: None (Preliminary result)   Collection Time: 02/19/19  5:10 AM   Specimen: BLOOD LEFT WRIST  Result Value Ref Range Status   Specimen Description BLOOD LEFT WRIST  Final   Special Requests   Final    BOTTLES DRAWN AEROBIC ONLY Blood Culture adequate volume   Culture   Final    NO GROWTH 2 DAYS Performed at [facility], [address], [place], [place] [address]    Report Status PENDING  Incomplete  Respiratory Panel by PCR     Status: None   Collection Time: 02/19/19  4:09 PM   Specimen: Nasopharyngeal Swab; Respiratory  Result Value Ref Range Status   Adenovirus NOT DETECTED NOT DETECTED Final   Coronavirus 229E NOT DETECTED NOT DETECTED Final    Comment: (NOTE) The Coronavirus on the Respiratory Panel, DOES NOT test for the novel  Coronavirus (2019 nCoV)    Coronavirus HKU1 NOT DETECTED NOT DETECTED Final   Coronavirus NL63 NOT DETECTED NOT DETECTED Final   Coronavirus OC43 NOT DETECTED NOT DETECTED Final   Metapneumovirus NOT DETECTED NOT DETECTED Final   Rhinovirus / Enterovirus NOT DETECTED NOT DETECTED Final   Influenza A NOT DETECTED NOT DETECTED Final   Influenza B NOT DETECTED NOT DETECTED Final   Parainfluenza Virus 1 NOT DETECTED NOT DETECTED Final   Parainfluenza Virus 2 NOT DETECTED NOT DETECTED Final   Parainfluenza Virus 3 NOT DETECTED NOT DETECTED Final   Parainfluenza Virus 4 NOT DETECTED NOT DETECTED Final   Respiratory Syncytial Virus NOT DETECTED NOT DETECTED Final   Bordetella pertussis NOT DETECTED NOT DETECTED Final   Chlamydophila pneumoniae NOT DETECTED NOT DETECTED Final   Mycoplasma pneumoniae NOT DETECTED NOT DETECTED Final    Comment: Performed at [facility], [address] [place], [place]  [id number]     Labs: BNP (last 3 results) Recent Labs    03/22/18 0359 05/12/18 1603 02/18/19 1639  BNP 1,508.9* >4,500.0* 2,601.2*   Basic Metabolic Panel: Recent Labs  Lab 02/18/19 1639 02/19/19 1400 02/20/19 0852 02/21/19 0716 02/22/19 0238  NA 134* 137 134* 134* 132*  K 4.7 3.8 3.8 4.0 4.0  CL 100 101 98 96* 96*  CO2 24 25 22 24 23   GLUCOSE 137* 84 114* 97 101*  BUN 50* 49* 51* 59* 62*  CREATININE 1.63* 1.39* 1.68* 1.88* 1.81*  CALCIUM 9.0 8.9 8.7* 8.3* 8.0*  PHOS  --  4.3  --   --   --    Liver Function Tests: Recent Labs  Lab 02/19/19 1400  ALBUMIN 3.5   No results for input(s): LIPASE, AMYLASE in the last 168 hours. Recent Labs  Lab 02/19/19 0458  AMMONIA 14   CBC: Recent Labs  Lab 02/18/19 1639 02/19/19 0458  WBC 10.8* 8.9  HGB 9.7* 9.6*  HCT 32.2* 31.1*  30.7*  MCV 89.9 88.4  PLT 147* 145*   Cardiac Enzymes: No results for input(s): CKTOTAL, CKMB, CKMBINDEX, TROPONINI in the last 168 hours. BNP: Invalid input(s): POCBNP CBG: No results for input(s): GLUCAP in the last 168 hours. D-Dimer No results for input(s): DDIMER in the last 72 hours. Hgb A1c No results for input(s): HGBA1C in the last 72 hours. Lipid Profile No results for input(s): CHOL, HDL, LDLCALC, TRIG, CHOLHDL, LDLDIRECT in the last 72 hours. Thyroid function studies Recent Labs    02/19/19 1400  T3FREE 1.8*   Anemia work up No results for input(s): VITAMINB12, FOLATE, FERRITIN, TIBC, IRON, RETICCTPCT in the last 72 hours. Urinalysis    Component Value Date/Time   COLORURINE YELLOW 02/18/2019 2240   APPEARANCEUR CLEAR 02/18/2019 2240   LABSPEC 1.010 02/18/2019 2240   PHURINE 5.0 02/18/2019 2240   GLUCOSEU NEGATIVE 02/18/2019 2240   HGBUR LARGE (A) 02/18/2019 2240   BILIRUBINUR NEGATIVE 02/18/2019 2240   KETONESUR NEGATIVE 02/18/2019 2240   PROTEINUR 30 (A) 02/18/2019 2240   UROBILINOGEN 4.0 (H) 11/14/2014 1849   NITRITE NEGATIVE 02/18/2019 2240   LEUKOCYTESUR  NEGATIVE 02/18/2019 2240   Sepsis Labs Invalid input(s): PROCALCITONIN,  WBC,  LACTICIDVEN Microbiology Recent Results (from the past 240 hour(s))  SARS CORONAVIRUS 2 (TAT 6-24 HRS) Nasopharyngeal Nasopharyngeal Swab     Status: None   Collection Time: 02/18/19 10:40 PM   Specimen: Nasopharyngeal Swab  Result Value Ref Range Status   SARS Coronavirus 2 NEGATIVE NEGATIVE Final    Comment: (NOTE) SARS-CoV-2 target nucleic acids are NOT DETECTED. The SARS-CoV-2 RNA is generally detectable in upper and lower respiratory specimens during the acute phase of infection. Negative results do not preclude SARS-CoV-2 infection, do not rule out co-infections with other pathogens, and should not be used as the sole basis for treatment or other patient management decisions. Negative results must be combined with clinical observations, patient history, and epidemiological information. The expected result is Negative. Fact Sheet for Patients: HairSlick.nohttps://www.fda.gov/media/138098/download Fact Sheet for Healthcare Providers: quierodirigir.comhttps://www.fda.gov/media/138095/download This test is not yet approved or cleared by the [place] FDA and  has been authorized for detection and/or diagnosis of SARS-CoV-2 by FDA under an Emergency Use Authorization (EUA). This EUA will remain  in effect (meaning this test can be used) for the duration of the COVID-19 declaration under Section 56 4(b)(1) of the Act, 21 U.S.C. section 360bbb-3(b)(1), unless the authorization is  terminated or revoked sooner. Performed at [facility], [address], [place], [place] [address]   MRSA PCR Screening     Status: None   Collection Time: 02/19/19  3:05 AM   Specimen: Nasopharyngeal  Result Value Ref Range Status   MRSA by PCR NEGATIVE NEGATIVE Final    Comment:        The GeneXpert MRSA Assay (FDA approved for NASAL specimens only), is one component of a comprehensive MRSA colonization surveillance program. It is  not intended to diagnose MRSA infection nor to guide or monitor treatment for MRSA infections. Performed at [facility], [address], [place], [place] [address]   Culture, blood (routine x 2)     Status: None (Preliminary result)   Collection Time: 02/19/19  4:58 AM   Specimen: BLOOD RIGHT WRIST  Result Value Ref Range Status   Specimen Description BLOOD RIGHT WRIST  Final   Special Requests   Final    BOTTLES DRAWN AEROBIC ONLY Blood Culture adequate volume   Culture   Final    NO GROWTH 2 DAYS Performed at [facility], [address] [place], [place] [address]    Report Status PENDING  Incomplete  Culture, blood (routine x 2)     Status: None (Preliminary result)   Collection Time: 02/19/19  5:10 AM   Specimen: BLOOD LEFT WRIST  Result Value Ref Range Status   Specimen Description BLOOD LEFT WRIST  Final   Special Requests   Final    BOTTLES DRAWN AEROBIC ONLY Blood Culture adequate volume   Culture   Final    NO GROWTH 2 DAYS Performed at [facility], [address] [place], [place] [address]    Report Status PENDING  Incomplete  Respiratory Panel by PCR     Status: None   Collection Time: 02/19/19  4:09 PM   Specimen: Nasopharyngeal Swab; Respiratory  Result Value Ref Range Status   Adenovirus NOT DETECTED NOT DETECTED Final   Coronavirus 229E NOT DETECTED NOT DETECTED Final    Comment: (NOTE) The Coronavirus on the Respiratory Panel, DOES NOT test for the novel  Coronavirus (2019 nCoV)    Coronavirus HKU1 NOT DETECTED NOT DETECTED Final   Coronavirus NL63 NOT DETECTED NOT DETECTED Final   Coronavirus OC43 NOT DETECTED NOT DETECTED Final   Metapneumovirus NOT DETECTED NOT DETECTED Final   Rhinovirus / Enterovirus NOT DETECTED NOT DETECTED Final   Influenza A NOT DETECTED NOT DETECTED Final   Influenza B NOT DETECTED NOT DETECTED Final   Parainfluenza Virus 1 NOT DETECTED NOT DETECTED Final   Parainfluenza Virus 2 NOT DETECTED NOT DETECTED  Final   Parainfluenza Virus 3 NOT DETECTED NOT DETECTED Final   Parainfluenza Virus 4 NOT DETECTED NOT DETECTED Final   Respiratory Syncytial Virus NOT DETECTED NOT DETECTED Final   Bordetella pertussis NOT DETECTED NOT DETECTED Final   Chlamydophila pneumoniae NOT DETECTED NOT DETECTED Final   Mycoplasma pneumoniae NOT DETECTED NOT DETECTED Final    Comment: Performed at [facility], [address], [place], [place] [address]     Patient was seen and examined on the day of discharge and was found to be in stable condition. Time coordinating discharge: 25 minutes including assessment and coordination of care, as well as examination of the patient.   SIGNED:  Noralee Stain, DO [facility] 02/22/2019, 8:29 AM

## 2019-02-24 LAB — CULTURE, BLOOD (ROUTINE X 2)
Culture: NO GROWTH
Culture: NO GROWTH
Special Requests: ADEQUATE
Special Requests: ADEQUATE

## 2019-03-30 ENCOUNTER — Inpatient Hospital Stay (HOSPITAL_COMMUNITY): Payer: Medicare Other

## 2019-03-30 ENCOUNTER — Inpatient Hospital Stay (HOSPITAL_COMMUNITY)
Admission: EM | Admit: 2019-03-30 | Discharge: 2019-04-10 | DRG: 291 | Disposition: A | Discharge status: Hospice | Payer: Medicare Other | Attending: Internal Medicine | Admitting: Internal Medicine

## 2019-03-30 ENCOUNTER — Encounter (HOSPITAL_COMMUNITY): Payer: Self-pay

## 2019-03-30 ENCOUNTER — Emergency Department (HOSPITAL_COMMUNITY): Payer: Medicare Other

## 2019-03-30 DIAGNOSIS — Z66 Do not resuscitate: Secondary | ICD-10-CM | POA: Diagnosis not present

## 2019-03-30 DIAGNOSIS — F0150 Vascular dementia without behavioral disturbance: Secondary | ICD-10-CM | POA: Diagnosis present

## 2019-03-30 DIAGNOSIS — D84821 Immunodeficiency due to drugs: Secondary | ICD-10-CM | POA: Diagnosis present

## 2019-03-30 DIAGNOSIS — R627 Adult failure to thrive: Secondary | ICD-10-CM | POA: Diagnosis present

## 2019-03-30 DIAGNOSIS — Z6825 Body mass index (BMI) 25.0-25.9, adult: Secondary | ICD-10-CM

## 2019-03-30 DIAGNOSIS — Z8349 Family history of other endocrine, nutritional and metabolic diseases: Secondary | ICD-10-CM

## 2019-03-30 DIAGNOSIS — D509 Iron deficiency anemia, unspecified: Secondary | ICD-10-CM | POA: Diagnosis present

## 2019-03-30 DIAGNOSIS — I2510 Atherosclerotic heart disease of native coronary artery without angina pectoris: Secondary | ICD-10-CM | POA: Diagnosis present

## 2019-03-30 DIAGNOSIS — R05 Cough: Secondary | ICD-10-CM

## 2019-03-30 DIAGNOSIS — I340 Nonrheumatic mitral (valve) insufficiency: Secondary | ICD-10-CM | POA: Diagnosis not present

## 2019-03-30 DIAGNOSIS — G9341 Metabolic encephalopathy: Secondary | ICD-10-CM | POA: Diagnosis not present

## 2019-03-30 DIAGNOSIS — I5043 Acute on chronic combined systolic (congestive) and diastolic (congestive) heart failure: Secondary | ICD-10-CM | POA: Diagnosis present

## 2019-03-30 DIAGNOSIS — B9620 Unspecified Escherichia coli [E. coli] as the cause of diseases classified elsewhere: Secondary | ICD-10-CM | POA: Diagnosis present

## 2019-03-30 DIAGNOSIS — N1832 Chronic kidney disease, stage 3b: Secondary | ICD-10-CM | POA: Diagnosis present

## 2019-03-30 DIAGNOSIS — Z515 Encounter for palliative care: Secondary | ICD-10-CM

## 2019-03-30 DIAGNOSIS — R1312 Dysphagia, oropharyngeal phase: Secondary | ICD-10-CM | POA: Diagnosis present

## 2019-03-30 DIAGNOSIS — R54 Age-related physical debility: Secondary | ICD-10-CM | POA: Diagnosis present

## 2019-03-30 DIAGNOSIS — I69391 Dysphagia following cerebral infarction: Secondary | ICD-10-CM

## 2019-03-30 DIAGNOSIS — I248 Other forms of acute ischemic heart disease: Secondary | ICD-10-CM | POA: Diagnosis present

## 2019-03-30 DIAGNOSIS — R059 Cough, unspecified: Secondary | ICD-10-CM

## 2019-03-30 DIAGNOSIS — Z79899 Other long term (current) drug therapy: Secondary | ICD-10-CM

## 2019-03-30 DIAGNOSIS — I509 Heart failure, unspecified: Secondary | ICD-10-CM | POA: Diagnosis not present

## 2019-03-30 DIAGNOSIS — M069 Rheumatoid arthritis, unspecified: Secondary | ICD-10-CM | POA: Diagnosis present

## 2019-03-30 DIAGNOSIS — F419 Anxiety disorder, unspecified: Secondary | ICD-10-CM

## 2019-03-30 DIAGNOSIS — Z881 Allergy status to other antibiotic agents status: Secondary | ICD-10-CM

## 2019-03-30 DIAGNOSIS — R945 Abnormal results of liver function studies: Secondary | ICD-10-CM | POA: Diagnosis present

## 2019-03-30 DIAGNOSIS — Z96653 Presence of artificial knee joint, bilateral: Secondary | ICD-10-CM | POA: Diagnosis present

## 2019-03-30 DIAGNOSIS — I472 Ventricular tachycardia: Secondary | ICD-10-CM | POA: Diagnosis not present

## 2019-03-30 DIAGNOSIS — E039 Hypothyroidism, unspecified: Secondary | ICD-10-CM | POA: Diagnosis present

## 2019-03-30 DIAGNOSIS — R0602 Shortness of breath: Secondary | ICD-10-CM

## 2019-03-30 DIAGNOSIS — N400 Enlarged prostate without lower urinary tract symptoms: Secondary | ICD-10-CM | POA: Diagnosis present

## 2019-03-30 DIAGNOSIS — J9601 Acute respiratory failure with hypoxia: Secondary | ICD-10-CM | POA: Diagnosis present

## 2019-03-30 DIAGNOSIS — Z20822 Contact with and (suspected) exposure to covid-19: Secondary | ICD-10-CM | POA: Diagnosis present

## 2019-03-30 DIAGNOSIS — K219 Gastro-esophageal reflux disease without esophagitis: Secondary | ICD-10-CM | POA: Diagnosis present

## 2019-03-30 DIAGNOSIS — I69354 Hemiplegia and hemiparesis following cerebral infarction affecting left non-dominant side: Secondary | ICD-10-CM

## 2019-03-30 DIAGNOSIS — N179 Acute kidney failure, unspecified: Secondary | ICD-10-CM | POA: Diagnosis present

## 2019-03-30 DIAGNOSIS — I5021 Acute systolic (congestive) heart failure: Secondary | ICD-10-CM | POA: Diagnosis not present

## 2019-03-30 DIAGNOSIS — D631 Anemia in chronic kidney disease: Secondary | ICD-10-CM | POA: Diagnosis present

## 2019-03-30 DIAGNOSIS — Z7189 Other specified counseling: Secondary | ICD-10-CM | POA: Diagnosis not present

## 2019-03-30 DIAGNOSIS — N390 Urinary tract infection, site not specified: Secondary | ICD-10-CM | POA: Diagnosis present

## 2019-03-30 DIAGNOSIS — R7989 Other specified abnormal findings of blood chemistry: Secondary | ICD-10-CM | POA: Diagnosis not present

## 2019-03-30 DIAGNOSIS — Z7982 Long term (current) use of aspirin: Secondary | ICD-10-CM

## 2019-03-30 DIAGNOSIS — Z8249 Family history of ischemic heart disease and other diseases of the circulatory system: Secondary | ICD-10-CM

## 2019-03-30 DIAGNOSIS — E870 Hyperosmolality and hypernatremia: Secondary | ICD-10-CM | POA: Diagnosis not present

## 2019-03-30 DIAGNOSIS — Z79890 Hormone replacement therapy (postmenopausal): Secondary | ICD-10-CM

## 2019-03-30 DIAGNOSIS — Z96642 Presence of left artificial hip joint: Secondary | ICD-10-CM | POA: Diagnosis present

## 2019-03-30 DIAGNOSIS — I480 Paroxysmal atrial fibrillation: Secondary | ICD-10-CM | POA: Diagnosis present

## 2019-03-30 DIAGNOSIS — H9190 Unspecified hearing loss, unspecified ear: Secondary | ICD-10-CM | POA: Diagnosis present

## 2019-03-30 DIAGNOSIS — I447 Left bundle-branch block, unspecified: Secondary | ICD-10-CM | POA: Diagnosis present

## 2019-03-30 DIAGNOSIS — IMO0002 Reserved for concepts with insufficient information to code with codable children: Secondary | ICD-10-CM

## 2019-03-30 DIAGNOSIS — I5084 End stage heart failure: Secondary | ICD-10-CM | POA: Diagnosis present

## 2019-03-30 DIAGNOSIS — I130 Hypertensive heart and chronic kidney disease with heart failure and stage 1 through stage 4 chronic kidney disease, or unspecified chronic kidney disease: Principal | ICD-10-CM | POA: Diagnosis present

## 2019-03-30 DIAGNOSIS — I5023 Acute on chronic systolic (congestive) heart failure: Secondary | ICD-10-CM | POA: Diagnosis not present

## 2019-03-30 DIAGNOSIS — R7401 Elevation of levels of liver transaminase levels: Secondary | ICD-10-CM | POA: Diagnosis not present

## 2019-03-30 DIAGNOSIS — L89151 Pressure ulcer of sacral region, stage 1: Secondary | ICD-10-CM | POA: Diagnosis present

## 2019-03-30 DIAGNOSIS — R778 Other specified abnormalities of plasma proteins: Secondary | ICD-10-CM

## 2019-03-30 DIAGNOSIS — I25810 Atherosclerosis of coronary artery bypass graft(s) without angina pectoris: Secondary | ICD-10-CM | POA: Diagnosis present

## 2019-03-30 DIAGNOSIS — Z953 Presence of xenogenic heart valve: Secondary | ICD-10-CM

## 2019-03-30 DIAGNOSIS — I959 Hypotension, unspecified: Secondary | ICD-10-CM | POA: Diagnosis present

## 2019-03-30 DIAGNOSIS — Z7952 Long term (current) use of systemic steroids: Secondary | ICD-10-CM

## 2019-03-30 DIAGNOSIS — K761 Chronic passive congestion of liver: Secondary | ICD-10-CM | POA: Diagnosis present

## 2019-03-30 DIAGNOSIS — Z7902 Long term (current) use of antithrombotics/antiplatelets: Secondary | ICD-10-CM

## 2019-03-30 DIAGNOSIS — I361 Nonrheumatic tricuspid (valve) insufficiency: Secondary | ICD-10-CM

## 2019-03-30 DIAGNOSIS — I252 Old myocardial infarction: Secondary | ICD-10-CM

## 2019-03-30 LAB — RETICULOCYTES
Immature Retic Fract: 35.5 % — ABNORMAL HIGH (ref 2.3–15.9)
RBC.: 3.18 MIL/uL — ABNORMAL LOW (ref 4.22–5.81)
Retic Count, Absolute: 102.7 10*3/uL (ref 19.0–186.0)
Retic Ct Pct: 3.2 % — ABNORMAL HIGH (ref 0.4–3.1)

## 2019-03-30 LAB — TROPONIN I (HIGH SENSITIVITY)
Troponin I (High Sensitivity): 2898 ng/L (ref <18)
Troponin I (High Sensitivity): 2687 ng/L (ref <18)
Troponin I (High Sensitivity): 2228 ng/L (ref <18)

## 2019-03-30 LAB — CBC WITH DIFFERENTIAL/PLATELET
Abs Immature Granulocytes: 0.23 10*3/uL — ABNORMAL HIGH (ref 0.00–0.07)
Basophils Absolute: 0.1 10*3/uL (ref 0.0–0.1)
Basophils Relative: 1 %
Eosinophils Absolute: 0.1 10*3/uL (ref 0.0–0.5)
Eosinophils Relative: 1 %
HCT: 29.2 % — ABNORMAL LOW (ref 39.0–52.0)
Hemoglobin: 8.6 g/dL — ABNORMAL LOW (ref 13.0–17.0)
Immature Granulocytes: 2 %
Lymphocytes Relative: 33 %
Lymphs Abs: 3.2 10*3/uL (ref 0.7–4.0)
MCH: 27 pg (ref 26.0–34.0)
MCHC: 29.5 g/dL — ABNORMAL LOW (ref 30.0–36.0)
MCV: 91.5 fL (ref 80.0–100.0)
Monocytes Absolute: 0.8 10*3/uL (ref 0.1–1.0)
Monocytes Relative: 8 %
Neutro Abs: 5.4 10*3/uL (ref 1.7–7.7)
Neutrophils Relative %: 55 %
Platelets: 181 10*3/uL (ref 150–400)
RBC: 3.19 MIL/uL — ABNORMAL LOW (ref 4.22–5.81)
RDW: 18.2 % — ABNORMAL HIGH (ref 11.5–15.5)
WBC: 9.9 10*3/uL (ref 4.0–10.5)
nRBC: 0.4 % — ABNORMAL HIGH (ref 0.0–0.2)

## 2019-03-30 LAB — ECHOCARDIOGRAM LIMITED
Height: 67 in
Weight: 2412.7 oz

## 2019-03-30 LAB — URINALYSIS, ROUTINE W REFLEX MICROSCOPIC
Bilirubin Urine: NEGATIVE
Glucose, UA: NEGATIVE mg/dL
Ketones, ur: NEGATIVE mg/dL
Nitrite: NEGATIVE
Protein, ur: NEGATIVE mg/dL
Specific Gravity, Urine: 1.01 (ref 1.005–1.030)
pH: 5 (ref 5.0–8.0)

## 2019-03-30 LAB — FERRITIN: Ferritin: 98 ng/mL (ref 24–336)

## 2019-03-30 LAB — IRON AND TIBC
Iron: 22 ug/dL — ABNORMAL LOW (ref 45–182)
Saturation Ratios: 6 % — ABNORMAL LOW (ref 17.9–39.5)
TIBC: 361 ug/dL (ref 250–450)
UIBC: 339 ug/dL

## 2019-03-30 LAB — BASIC METABOLIC PANEL
Anion gap: 12 (ref 5–15)
BUN: 78 mg/dL — ABNORMAL HIGH (ref 8–23)
CO2: 22 mmol/L (ref 22–32)
Calcium: 8.6 mg/dL — ABNORMAL LOW (ref 8.9–10.3)
Chloride: 99 mmol/L (ref 98–111)
Creatinine, Ser: 2.21 mg/dL — ABNORMAL HIGH (ref 0.61–1.24)
GFR calc Af Amer: 30 mL/min — ABNORMAL LOW (ref >60)
GFR calc non Af Amer: 25 mL/min — ABNORMAL LOW (ref >60)
Glucose, Bld: 139 mg/dL — ABNORMAL HIGH (ref 70–99)
Potassium: 5.1 mmol/L (ref 3.5–5.1)
Sodium: 133 mmol/L — ABNORMAL LOW (ref 135–145)

## 2019-03-30 LAB — D-DIMER, QUANTITATIVE: D-Dimer, Quant: 17.53 ug/mL-FEU — ABNORMAL HIGH (ref 0.00–0.50)

## 2019-03-30 LAB — BRAIN NATRIURETIC PEPTIDE: B Natriuretic Peptide: >4500 pg/mL — ABNORMAL HIGH (ref 0.0–100.0)

## 2019-03-30 LAB — HEPATIC FUNCTION PANEL
ALT: 105 U/L — ABNORMAL HIGH (ref 0–44)
AST: 161 U/L — ABNORMAL HIGH (ref 15–41)
Albumin: 3.4 g/dL — ABNORMAL LOW (ref 3.5–5.0)
Alkaline Phosphatase: 107 U/L (ref 38–126)
Bilirubin, Direct: 0.2 mg/dL (ref 0.0–0.2)
Indirect Bilirubin: 0.7 mg/dL (ref 0.3–0.9)
Total Bilirubin: 0.9 mg/dL (ref 0.3–1.2)
Total Protein: 6.7 g/dL (ref 6.5–8.1)

## 2019-03-30 LAB — RESPIRATORY PANEL BY RT PCR (FLU A&B, COVID)
Influenza A by PCR: NEGATIVE
Influenza B by PCR: NEGATIVE
SARS Coronavirus 2 by RT PCR: NEGATIVE

## 2019-03-30 LAB — LIPASE, BLOOD: Lipase: 36 U/L (ref 11–51)

## 2019-03-30 LAB — POC SARS CORONAVIRUS 2 AG -  ED: SARS Coronavirus 2 Ag: NEGATIVE

## 2019-03-30 MED ORDER — VITAMIN B-12 1000 MCG PO TABS
1000.0000 ug | ORAL_TABLET | Freq: Every day | ORAL | Status: DC
  Administered 2019-03-31 – 2019-04-07 (×8): 1000 ug via ORAL
  Filled 2019-03-30 (×10): qty 1

## 2019-03-30 MED ORDER — FOLIC ACID 1 MG PO TABS
3.0000 mg | ORAL_TABLET | Freq: Every day | ORAL | Status: DC
  Administered 2019-03-31 – 2019-04-07 (×8): 3 mg via ORAL
  Filled 2019-03-30 (×10): qty 3

## 2019-03-30 MED ORDER — PERFLUTREN LIPID MICROSPHERE
1.0000 mL | INTRAVENOUS | Status: AC | PRN
  Administered 2019-03-30: 3 mL via INTRAVENOUS
  Filled 2019-03-30: qty 10

## 2019-03-30 MED ORDER — PREDNISONE 5 MG PO TABS
5.0000 mg | ORAL_TABLET | Freq: Every day | ORAL | Status: DC
  Administered 2019-03-31 – 2019-04-09 (×10): 5 mg via ORAL
  Filled 2019-03-30 (×10): qty 1

## 2019-03-30 MED ORDER — ACETAMINOPHEN 500 MG PO TABS
1000.0000 mg | ORAL_TABLET | Freq: Two times a day (BID) | ORAL | Status: DC
  Administered 2019-03-30 – 2019-04-08 (×17): 1000 mg via ORAL
  Filled 2019-03-30 (×20): qty 2

## 2019-03-30 MED ORDER — SODIUM CHLORIDE 0.9 % IV SOLN
250.0000 mL | INTRAVENOUS | Status: DC | PRN
  Administered 2019-04-01 – 2019-04-02 (×2): 250 mL via INTRAVENOUS

## 2019-03-30 MED ORDER — FUROSEMIDE 10 MG/ML IJ SOLN: 40.0000 mg | Freq: Two times a day (BID) | INTRAMUSCULAR | Status: DC

## 2019-03-30 MED ORDER — SODIUM CHLORIDE 0.9% FLUSH
3.0000 mL | Freq: Two times a day (BID) | INTRAVENOUS | Status: DC
  Administered 2019-03-30 – 2019-04-10 (×19): 3 mL via INTRAVENOUS

## 2019-03-30 MED ORDER — ASPIRIN EC 81 MG PO TBEC
81.0000 mg | DELAYED_RELEASE_TABLET | Freq: Every day | ORAL | Status: DC
  Administered 2019-03-31 – 2019-04-08 (×9): 81 mg via ORAL
  Filled 2019-03-30 (×11): qty 1

## 2019-03-30 MED ORDER — HEPARIN BOLUS VIA INFUSION
4000.0000 [IU] | Freq: Once | INTRAVENOUS | Status: DC
  Filled 2019-03-30: qty 4000

## 2019-03-30 MED ORDER — HEPARIN (PORCINE) 25000 UT/250ML-% IV SOLN
850.0000 [IU]/h | INTRAVENOUS | Status: DC
  Filled 2019-03-30: qty 250

## 2019-03-30 MED ORDER — SODIUM CHLORIDE 0.9% FLUSH
3.0000 mL | INTRAVENOUS | Status: DC | PRN
  Administered 2019-04-08: 3 mL via INTRAVENOUS

## 2019-03-30 MED ORDER — LEVOTHYROXINE SODIUM 100 MCG PO TABS
100.0000 ug | ORAL_TABLET | Freq: Every day | ORAL | Status: DC
  Administered 2019-04-01 – 2019-04-09 (×9): 100 ug via ORAL
  Filled 2019-03-30 (×10): qty 1

## 2019-03-30 MED ORDER — FUROSEMIDE 10 MG/ML IJ SOLN
80.0000 mg | Freq: Once | INTRAMUSCULAR | Status: AC
  Administered 2019-03-30: 13:00:00 80 mg via INTRAVENOUS
  Filled 2019-03-30: qty 8

## 2019-03-30 MED ORDER — CARVEDILOL 6.25 MG PO TABS
6.2500 mg | ORAL_TABLET | Freq: Two times a day (BID) | ORAL | Status: DC
  Filled 2019-03-30: qty 1

## 2019-03-30 MED ORDER — HEPARIN SODIUM (PORCINE) 5000 UNIT/ML IJ SOLN
5000.0000 [IU] | Freq: Two times a day (BID) | INTRAMUSCULAR | Status: DC
  Administered 2019-03-30 – 2019-04-08 (×19): 5000 [IU] via SUBCUTANEOUS
  Filled 2019-03-30 (×20): qty 1

## 2019-03-30 MED ORDER — ACETAMINOPHEN 325 MG PO TABS: 650.0000 mg | ORAL_TABLET | ORAL | Status: DC | PRN

## 2019-03-30 MED ORDER — FUROSEMIDE 10 MG/ML IJ SOLN
80.0000 mg | Freq: Two times a day (BID) | INTRAMUSCULAR | Status: DC
  Administered 2019-03-30: 21:00:00 80 mg via INTRAVENOUS
  Filled 2019-03-30: qty 8

## 2019-03-30 MED ORDER — NITROGLYCERIN 0.4 MG SL SUBL: 0.4000 mg | SUBLINGUAL_TABLET | SUBLINGUAL | Status: DC | PRN

## 2019-03-30 MED ORDER — ATORVASTATIN CALCIUM 10 MG PO TABS
20.0000 mg | ORAL_TABLET | Freq: Every day | ORAL | Status: DC
  Administered 2019-03-31 – 2019-04-08 (×9): 20 mg via ORAL
  Filled 2019-03-30 (×10): qty 2

## 2019-03-30 MED ORDER — CALCIUM CARBONATE ANTACID 500 MG PO CHEW: 1.0000 | CHEWABLE_TABLET | Freq: Two times a day (BID) | ORAL | Status: DC | PRN

## 2019-03-30 MED ORDER — ONDANSETRON HCL 4 MG/2ML IJ SOLN
4.0000 mg | Freq: Once | INTRAMUSCULAR | Status: AC
  Administered 2019-03-30: 4 mg via INTRAVENOUS
  Filled 2019-03-30: qty 2

## 2019-03-30 MED ORDER — ENSURE ENLIVE PO LIQD
237.0000 mL | Freq: Three times a day (TID) | ORAL | Status: DC
  Administered 2019-03-31 – 2019-04-01 (×2): 237 mL via ORAL
  Filled 2019-03-30: qty 237

## 2019-03-30 MED ORDER — FLUTICASONE PROPIONATE 50 MCG/ACT NA SUSP
1.0000 | Freq: Every evening | NASAL | Status: DC | PRN
  Filled 2019-03-30: qty 16

## 2019-03-30 MED ORDER — CLOPIDOGREL BISULFATE 75 MG PO TABS
75.0000 mg | ORAL_TABLET | Freq: Every day | ORAL | Status: DC
  Administered 2019-03-31 – 2019-04-08 (×9): 75 mg via ORAL
  Filled 2019-03-30 (×10): qty 1

## 2019-03-30 MED ORDER — ONDANSETRON HCL 4 MG/2ML IJ SOLN: 4.0000 mg | Freq: Four times a day (QID) | INTRAMUSCULAR | Status: DC | PRN

## 2019-03-30 MED ORDER — ADULT MULTIVITAMIN W/MINERALS CH
1.0000 | ORAL_TABLET | Freq: Every day | ORAL | Status: DC
  Administered 2019-04-01 – 2019-04-07 (×7): 1 via ORAL
  Filled 2019-03-30 (×10): qty 1

## 2019-03-30 NOTE — ED Notes (Signed)
EDP [place] at bedside & decreasing pt's O2, he is tol well so far.

## 2019-03-30 NOTE — ED Notes (Signed)
Cardiology at bedside.

## 2019-03-30 NOTE — ED Provider Notes (Signed)
[facility] EMERGENCY DEPARTMENT Provider Note   CSN: [id number] Arrival date & time: 03/30/19  0940     History Chief Complaint  Patient presents with  . Shortness of Breath    Hunter Choi is a 86 y.o. male.   The history is provided by the patient and a caregiver.  Shortness of Breath Severity:  Moderate Onset quality:  Gradual Duration:  2 weeks Timing:  Constant Progression:  Worsening Chronicity:  Recurrent Context comment:  Increased in 6 pounds over last week, family concern for fluid overload. Had episode of syncopal while sitting in chair.  Relieved by:  Nothing Worsened by:  Nothing Associated symptoms: no abdominal pain, no chest pain, no cough, no ear pain, no fever, no rash, no sore throat and no vomiting   Risk factors comment:  CHF, CKD      Past Medical History:  Diagnosis Date  . AAA (abdominal aortic aneurysm) (HCC)    Dr. Ashley Royalty  . Aortic stenosis, severe    a. s/p TAVR  . Blind left eye   . BPH (benign prostatic hypertrophy)    Dr. Retta Diones  . CAD (coronary artery disease) of bypass graft    Chronic occlusion of saphenous vein graft to RCA  . Coronary artery disease    Left main disease and severe 3-vessel CAD  . Diastolic heart failure (HCC)   . Embolism involving retinal artery   . GERD (gastroesophageal reflux disease)   . Hard of hearing   . Hypertension   . Hypothyroidism   . Iliac artery aneurysm (HCC)    Dr. Edilia Bo  . PAF (paroxysmal atrial fibrillation) (HCC)   . 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 12/2012: mm Stephannie Peters XT transcatheter heart valve placed via transapical approach  . Stroke [facility])    mini stroke effective his eye left    Patient Active Problem List   Diagnosis Date Noted  . Shortness of breath   . Acute exacerbation of CHF (congestive heart failure) (HCC) 05/12/2018  . Nausea vomiting and diarrhea 04/22/2018  . B12 deficiency 03/27/2018  .  Hypothyroidism 03/27/2018  . Chronic combined systolic and diastolic CHF (congestive heart failure) (HCC) 03/27/2018  . Protein-calorie malnutrition, severe 03/24/2018  . Generalized weakness   . Acute metabolic encephalopathy 03/22/2018  . CKD (chronic kidney disease) stage 3, GFR 30-59 ml/min 12/09/2017  . SIRS (systemic inflammatory response syndrome) (HCC) 08/03/2017  . Essential hypertension 08/03/2017  . Elevated troponin   . Sepsis due to urinary tract infection (HCC) 10/02/2015  . Hard of hearing   . Pressure ulcer 01/28/2015  . Acute on chronic combined systolic and diastolic congestive heart failure, NYHA class 4 (HCC) 01/27/2015  . Hyponatremia 11/21/2014  . Elevated LFTs 11/14/2014  . Anemia 05/23/2014  . Thrombocytopenia (HCC) 02/21/2014  . Malnutrition of moderate degree (HCC) 02/21/2014  . Occlusion and stenosis of carotid artery without mention of cerebral infarction 09/04/2013  . Aftercare following surgery of the circulatory system, NEC 09/04/2013  . AAA (abdominal aortic aneurysm) without rupture (HCC) 03/06/2013  . 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 Coronary artery disease involving coronary bypass graft of native heart with angina pectoris (HCC)   . GERD (gastroesophageal reflux disease)   . Thyroid disease   . Embolism involving retinal artery   . Iliac artery aneurysm (HCC)   . Rheumatoid arthritis (HCC) 04/22/2010    Past Surgical History:  Procedure Laterality Date  . BIOPSY  PROSTATE  2005  . BUNIONECTOMY WITH HAMMERTOE RECONSTRUCTION Bilateral   . CARDIAC CATHETERIZATION    . CARDIAC CATHETERIZATION N/A 05/07/2015   Procedure: Right Heart Cath and Coronary/Graft Angiography;  Surgeon: Lyn Records, MD;  Location: [facility] INVASIVE CV LAB;  Service: Cardiovascular;  Laterality: N/A;  . CAROTID ENDARTERECTOMY Right Jan. 2, 1997  . CATARACT EXTRACTION W/ INTRAOCULAR LENS IMPLANT Right   . CHOLECYSTECTOMY N/A 01/28/2015   Procedure:  LAPAROSCOPIC CHOLECYSTECTOMY WITH INTRAOPERATIVE CHOLANGIOGRAM;  Surgeon: Gaynelle Adu, MD;  Location: [facility] OR;  Service: General;  Laterality: N/A;  . COLONOSCOPY    . CORONARY ARTERY BYPASS GRAFT  01/1998   Dr. Sheliah Plane; LIMA to LAD, SVG to D1, SVG to LCx, SVG to RCA, open saphenous vein harvest via right lower extremity  . HARVEST BONE GRAFT  1982   FOR THE  ANKLE  . HEMIARTHROPLASTY HIP Left    AFTER HIP FX  . INTRAOPERATIVE TRANSESOPHAGEAL ECHOCARDIOGRAM N/A 01/15/2013   Procedure: INTRAOPERATIVE TRANSESOPHAGEAL ECHOCARDIOGRAM;  Surgeon: [name], MD;  Location: [facility] OR;  Service: Open Heart Surgery;  Laterality: N/A;  . JOINT REPLACEMENT    . LEFT AND RIGHT HEART CATHETERIZATION WITH CORONARY ANGIOGRAM N/A 12/20/2012   Procedure: LEFT AND RIGHT HEART CATHETERIZATION WITH CORONARY ANGIOGRAM;  Surgeon: Lesleigh Noe, MD;  Location: [facility] CATH LAB;  Service: Cardiovascular;  Laterality: N/A;  . LEFT HEART CATH AND CORS/GRAFTS ANGIOGRAPHY N/A 02/14/2017   Procedure: LEFT HEART CATH AND CORS/GRAFTS ANGIOGRAPHY;  Surgeon: Marykay Lex, MD;  Location: [facility] INVASIVE CV LAB;  Service: Cardiovascular;  Laterality: N/A;  . SKIN GRAFTS  1968  . TOTAL HIP ARTHROPLASTY Left 2007  . TOTAL KNEE ARTHROPLASTY  1992-2002   right-left  . TRANSCATHETER AORTIC VALVE REPLACEMENT, TRANSAPICAL N/A 01/15/2013   Procedure: TRANSCATHETER AORTIC VALVE REPLACEMENT, TRANSAPICAL;  Surgeon: [name], MD;  Location: [facility] OR;  Service: Open Heart Surgery;  Laterality: N/A;  . TRANSURETHRAL RESECTION OF PROSTATE  2012       Family History  Problem Relation Age of Onset  . Heart attack Mother   . Heart disease Mother        Before age 36  . Hyperlipidemia Mother   . Hypertension Mother   . Hypertension Son   . Heart disease Father        AAA-   . Hyperlipidemia Father   . Heart attack Father   . Hypertension Father   . Heart disease Brother        Before age 76  . Hyperlipidemia Brother   .  Heart attack Brother   . Hypertension Brother     Social History   Tobacco Use  . Smoking status: Former Smoker    Packs/day: 0.50    Years: 37.00    Pack years: 18.50    Types: Cigarettes    Quit date: 03/29/1963    Years since quitting: 56.0  . Smokeless tobacco: Former Neurosurgeon    Types: Chew  Substance Use Topics  . Alcohol use: No  . Drug use: No    Home Medications Prior to Admission medications   Medication Sig Start Date End Date Taking? Authorizing Provider  acetaminophen (TYLENOL) 500 MG tablet Take 1,000 mg by mouth 2 (two) times daily.    [provider]  amLODipine (NORVASC) 5 MG tablet Take 5 mg by mouth daily.    [provider]  aspirin EC 81 MG EC tablet Take 1 tablet (81 mg total) by mouth  daily. 01/21/13   Ardelle Balls, PA-C  atorvastatin (LIPITOR) 20 MG tablet Take 20 mg by mouth daily.    [provider]  Calcium Carbonate Antacid (TUMS PO) Take 1 tablet by mouth 2 (two) times daily as needed (heartburn/indigestion).    [provider]  carvedilol (COREG) 6.25 MG tablet Take 1 tablet (6.25 mg total) by mouth 2 (two) times daily with a meal. 10/21/15   Lyn Records, MD  clopidogrel (PLAVIX) 75 MG tablet TAKE 1 TABLET BY MOUTH Choi DAY Patient taking differently: Take 75 mg by mouth daily.  11/12/18   Leone Brand, NP  feeding supplement, ENSURE ENLIVE, (ENSURE ENLIVE) LIQD Take 237 mLs by mouth 3 (three) times daily between meals. 03/27/18   Dhungel, Theda Belfast, MD  fluticasone (FLONASE) 50 MCG/ACT nasal spray Place 1 spray into both nostrils at bedtime as needed for allergies or rhinitis.    [provider]  folic acid (FOLVITE) 1 MG tablet Take 3 mg by mouth daily.    [provider]  furosemide (LASIX) 20 MG tablet TAKE 2 TABLETS BY MOUTH Choi DAY Patient taking differently: Take 40 mg by mouth daily.  01/15/19   Lyn Records, MD  levothyroxine (SYNTHROID, LEVOTHROID) 100 MCG tablet Take 100 mcg  by mouth daily before breakfast. 05/09/18   [provider]  losartan (COZAAR) 25 MG tablet Take 25 mg by mouth daily. 03/19/18   [provider]  methotrexate 2.5 MG tablet Take 25 mg by mouth Choi Friday.     [provider]  Multiple Vitamin (MULTIVITAMIN WITH MINERALS) TABS tablet Take 1 tablet by mouth daily. 03/27/18   Dhungel, Theda Belfast, MD  nitroGLYCERIN (NITROSTAT) 0.4 MG SL tablet Place 0.4 mg under the tongue Choi 5 (five) minutes as needed for chest pain.    [provider]  predniSONE (DELTASONE) 5 MG tablet Take 5 mg by mouth daily. 04/07/15   [provider]  vitamin B-12 (CYANOCOBALAMIN) 1000 MCG tablet Take 1 tablet (1,000 mcg total) by mouth daily. 03/27/18   Dhungel, Theda Belfast, MD    Allergies    Ciprofloxacin  Review of Systems   Review of Systems  Constitutional: Negative for chills and fever.  HENT: Negative for ear pain and sore throat.   Eyes: Negative for pain and visual disturbance.  Respiratory: Positive for shortness of breath. Negative for cough.   Cardiovascular: Negative for chest pain and palpitations.  Gastrointestinal: Negative for abdominal pain and vomiting.  Genitourinary: Negative for dysuria and hematuria.  Musculoskeletal: Negative for arthralgias and back pain.  Skin: Negative for color change and rash.  Neurological: Positive for weakness. Negative for seizures and syncope.  Psychiatric/Behavioral: Positive for confusion.  All other systems reviewed and are negative.   Physical Exam Updated Vital Signs  ED Triage Vitals  Enc Vitals Group     BP 03/30/19 0951 102/77     Pulse Rate 03/30/19 0952 64     Resp 03/30/19 0951 20     Temp 03/30/19 0954 98.4 F (36.9 C)     Temp Source 03/30/19 0954 Oral     SpO2 03/30/19 0942 100 %     Weight --      Height --      Head Circumference --      Peak Flow --      Pain Score 03/30/19 0943 0     Pain Loc --      Pain Edu? --      Excl.  in GC? --      Physical Exam Vitals and nursing note reviewed.  Constitutional:      Appearance: He is well-developed. He is ill-appearing.  HENT:     Head: Normocephalic and atraumatic.  Eyes:     Conjunctiva/sclera: Conjunctivae normal.  Cardiovascular:     Rate and Rhythm: Normal rate and regular rhythm.     Heart sounds: No murmur.  Pulmonary:     Effort: Tachypnea present. No respiratory distress.     Breath sounds: Decreased breath sounds and rhonchi present.  Abdominal:     Palpations: Abdomen is soft.     Tenderness: There is no abdominal tenderness.  Musculoskeletal:     Cervical back: Neck supple.  Skin:    General: Skin is warm and dry.     Capillary Refill: Capillary refill takes less than 2 seconds.  Neurological:     General: No focal deficit present.     Mental Status: He is alert.     Cranial Nerves: No cranial nerve deficit.     Motor: No weakness.  Psychiatric:        Mood and Affect: Mood normal.     ED Results / Procedures / Treatments   Labs (all labs ordered are listed, but only abnormal results are displayed) Labs Reviewed  CBC WITH DIFFERENTIAL/PLATELET - Abnormal; Notable for the following components:      Result Value   RBC 3.19 (*)    Hemoglobin 8.6 (*)    HCT 29.2 (*)    MCHC 29.5 (*)    RDW 18.2 (*)    nRBC 0.4 (*)    Abs Immature Granulocytes 0.23 (*)    All other components within normal limits  BASIC METABOLIC PANEL - Abnormal; Notable for the following components:   Sodium 133 (*)    Glucose, Bld 139 (*)    BUN 78 (*)    Creatinine, Ser 2.21 (*)    Calcium 8.6 (*)    GFR calc non Af Amer 25 (*)    GFR calc Af Amer 30 (*)    All other components within normal limits  HEPATIC FUNCTION PANEL - Abnormal; Notable for the following components:   Albumin 3.4 (*)    AST 161 (*)    ALT 105 (*)    All other components within normal limits  TROPONIN I (HIGH SENSITIVITY) - Abnormal; Notable for the following components:   Troponin I (High  Sensitivity) 2,898 (*)    All other components within normal limits  URINE CULTURE  RESPIRATORY PANEL BY RT PCR (FLU A&B, COVID)  LIPASE, BLOOD  URINALYSIS, ROUTINE W REFLEX MICROSCOPIC  BRAIN NATRIURETIC PEPTIDE  D-DIMER, QUANTITATIVE (NOT AT [facility])  POC SARS CORONAVIRUS 2 AG -  ED  TROPONIN I (HIGH SENSITIVITY)    EKG EKG Interpretation  Date/Time:  Saturday March 30 2019 10:31:16 EST Ventricular Rate:  62 PR Interval:    QRS Duration: 195 QT Interval:  481 QTC Calculation: 489 R Axis:   -4 Text Interpretation: Sinus rhythm Prolonged PR interval IVCD, consider atypical LBBB Confirmed by Virgina Norfolk [phone]) on 03/30/2019 10:47:42 AM   Radiology DG Chest Portable 1 View  Result Date: 03/30/2019 CLINICAL DATA:  Cp and sob, low o2. Best obtainable due to pt condition. EXAM: PORTABLE CHEST 1 VIEW COMPARISON:  Chest radiograph 02/18/2019 FINDINGS: Stable cardiomediastinal contours status post median sternotomy and CABG. There are new mild diffuse hazy interstitial opacities which could represent trace edema or atypical infection. Chronic faint  reticular opacities at the lung bases could represent a mild fibrosis. Possible small right pleural effusion. IMPRESSION: Very mild diffuse interstitial opacities could represent trace pulmonary edema or atypical infection. Possible tiny right pleural effusion. Chronic changes at the bilateral lung bases. Electronically Signed   By: Audie Pinto M.D.   On: 03/30/2019 10:43    Procedures Procedures (including critical care time)  Medications Ordered in ED Medications  ondansetron (ZOFRAN) injection 4 mg (4 mg Intravenous Given 03/30/19 1156)  furosemide (LASIX) injection 80 mg (80 mg Intravenous Given 03/30/19 1240)    ED Course  I have reviewed the triage vital signs and the nursing notes.  Pertinent labs & imaging results that were available during my care of the patient were reviewed by me and considered in my medical decision making  (see chart for details).    MDM Rules/Calculators/A&P  Hunter Choi is an 86 year old male with history of hypertension, status post TAVR, heart failure, CAD, stroke who presents to the ED with shortness of breath.  Symptoms have been gradual over the last 2 weeks.  Patient recently admitted for volume overload.  Patient has become more tachypneic, altered per family.  Patient's son is a primary caregiver.  He states that over the last several days he has doubled dose of Lasix with some mild improvement however patient continues to have about a 6 pound weight gain.  Patient is tachypneic, has rales on exam.  No major edema in his legs.  Patient is very hard of hearing but denies any chest pain or cough.  No abdominal tenderness on exam.  Has not had a fever.  Will evaluate for shortness of breath with labs including chest x-ray, BNP, troponin.  We will get a Covid test.  Suspect that this is likely volume overload.  Chest x-ray shows volume overload versus atypical pneumonia.  Covid test thus far is negative.  Troponin is elevated to about 3000.  BNP is pending but likely also to be elevated.  Creatinine mildly above baseline.  Talked with cardiology and at this time as patient does not have chest pain and no significant EKG changes will hold on heparin.  Will trend troponin and come evaluate the patient to discuss further work-up.  Suspect volume overload.  Less likely infectious process.  No fever.  No leukocytosis.  No risk factors for PE.  However due to elevated creatinine will be unable to get PE study.  Will add D-dimer to further evaluate for other causes of shortness of breath and elevated troponin.  Will discuss anticoagulation with hospitalist.  May pursue DVT studies as well.  However, suspect that this is likely from volume overload.  Hospitalist agrees with holding any anticoagulation at this time as well.  This chart was dictated using voice recognition software.  Despite best efforts to  proofread,  errors can occur which can change the documentation meaning.    Final Clinical Impression(s) / ED Diagnoses Final diagnoses:  Shortness of breath  Elevated troponin  Acute on chronic congestive heart failure, unspecified heart failure type [facility])    Rx / DC Orders ED Discharge Orders    None       Lennice Sites, DO 03/30/19 1247

## 2019-03-30 NOTE — ED Notes (Signed)
Pharmacy has been contacted to verify meds, this RN was informed that a med Hx is being waited for by pharmacist.

## 2019-03-30 NOTE — Progress Notes (Signed)
  Echocardiogram 2D Echocardiogram has been performed.  Hunter Choi 03/30/2019, 5:22 PM

## 2019-03-30 NOTE — ED Triage Notes (Signed)
Pt brought in by [facility] d/t reports of increased respiratory problems in the past 3 days (per son). Pt was being assisted by his son to come to the ED for evaluation & while getting ready pt had a near-syncope event, so the son called 911 Instead. Upon arrival to ED pt was on NRB with 15 L O2 & sating 100%. No IV access, A/Ox4, very HOH.

## 2019-03-30 NOTE — ED Notes (Signed)
Pt continues to refuse to wear his oxygen via n/c. EMT Mellody Dance) approached pt with a face mask instead of a n/c & pt is complying with wearing his 2L of O2 in that manner.

## 2019-03-30 NOTE — H&P (Signed)
History and Physical    Hunter Choi HEN:[id number] DOB: 25-Mar-1928 DOA: 03/30/2019  PCP: Hunter Cha, MD (Confirm with patient/family/NH records and if not entered, this has to be entered at [facility] point of entry) Patient coming from: Home  I have personally briefly reviewed patient's old medical records in [place]  Chief Complaint: SOB  HPI: Hunter Choi is a 86 y.o. male with medical history significant of CAD, s/p TARV, CHF, CVA, hypothyroidism who presents to ER for worsening dyspnea for one week. Patient is accompanied by his son, Hunter Choi, who is his full time caretaker.  Patient is hard-hearing and thus most of the history provided by patient's son.  The son noted over the past 7 days pt has ad several lbs weight gain too. Son increased home Lasix 40 mg qd to 40 mg BID for 3 days without significant improvement. Has noted increased edema to LE bilaterally. Patient aslo had decreased appetite recently and some generalized weakness.  Denies any chest pain no fever chills no cough, no nauseous vomiting no diarrhea.  No abdominal pain.     ED Course: ED work-up showed significant elevation of troponins, EKG showed no significant ST-T changes compared that in November last year, who had elevated creatinine levels, x-ray showed fluid overload cardiomegaly.  Cardiology is aware and want to trend troponin to decide heparin drip.  Review of Systems: As per HPI otherwise 10 point review of systems negative.    Past Medical History:  Diagnosis Date  . AAA (abdominal aortic aneurysm) ([place])    Dr. Rosalia Hammers  . Aortic stenosis, severe    a. s/p TAVR  . Blind left eye   . BPH (benign prostatic hypertrophy)    Dr. Diona Fanti  . CAD (coronary artery disease) of bypass graft    Chronic occlusion of saphenous vein graft to RCA  . Coronary artery disease    Left main disease and severe 3-vessel CAD  . Diastolic heart failure ([place])   . Embolism involving retinal artery   . GERD  (gastroesophageal reflux disease)   . Hard of hearing   . Hypertension   . Hypothyroidism   . Iliac artery aneurysm ([facility])    Dr. Scot Dock  . PAF (paroxysmal atrial fibrillation) ([place])   . 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 12/2012: mm Berniece Pap XT transcatheter heart valve placed via transapical approach  . Stroke [facility])    mini stroke effective his eye left    Past Surgical History:  Procedure Laterality Date  . BIOPSY PROSTATE  2005  . BUNIONECTOMY WITH HAMMERTOE RECONSTRUCTION Bilateral   . CARDIAC CATHETERIZATION    . CARDIAC CATHETERIZATION N/A 05/07/2015   Procedure: Right Heart Cath and Coronary/Graft Angiography;  Surgeon: Belva Crome, MD;  Location: [place] CV LAB;  Service: Cardiovascular;  Laterality: N/A;  . CAROTID ENDARTERECTOMY Right Jan. 2, 1997  . CATARACT EXTRACTION W/ INTRAOCULAR LENS IMPLANT Right   . CHOLECYSTECTOMY N/A 01/28/2015   Procedure: LAPAROSCOPIC CHOLECYSTECTOMY WITH INTRAOPERATIVE CHOLANGIOGRAM;  Surgeon: Greer Pickerel, MD;  Location: [place];  Service: General;  Laterality: N/A;  . COLONOSCOPY    . CORONARY ARTERY BYPASS GRAFT  01/1998   Dr. Lanelle Bal; LIMA to LAD, SVG to D1, SVG to LCx, SVG to RCA, open saphenous vein harvest via right lower extremity  . HARVEST BONE GRAFT  1982   FOR THE  ANKLE  . HEMIARTHROPLASTY HIP Left    AFTER HIP FX  . INTRAOPERATIVE  TRANSESOPHAGEAL ECHOCARDIOGRAM N/A 01/15/2013   Procedure: INTRAOPERATIVE TRANSESOPHAGEAL ECHOCARDIOGRAM;  Surgeon: [name], MD;  Location: [facility] OR;  Service: Open Heart Surgery;  Laterality: N/A;  . JOINT REPLACEMENT    . LEFT AND RIGHT HEART CATHETERIZATION WITH CORONARY ANGIOGRAM N/A 12/20/2012   Procedure: LEFT AND RIGHT HEART CATHETERIZATION WITH CORONARY ANGIOGRAM;  Surgeon: Lesleigh Noe, MD;  Location: [facility] CATH LAB;  Service: Cardiovascular;  Laterality: N/A;  . LEFT HEART CATH AND CORS/GRAFTS ANGIOGRAPHY N/A 02/14/2017   Procedure: LEFT  HEART CATH AND CORS/GRAFTS ANGIOGRAPHY;  Surgeon: Marykay Lex, MD;  Location: [facility] INVASIVE CV LAB;  Service: Cardiovascular;  Laterality: N/A;  . SKIN GRAFTS  1968  . TOTAL HIP ARTHROPLASTY Left 2007  . TOTAL KNEE ARTHROPLASTY  1992-2002   right-left  . TRANSCATHETER AORTIC VALVE REPLACEMENT, TRANSAPICAL N/A 01/15/2013   Procedure: TRANSCATHETER AORTIC VALVE REPLACEMENT, TRANSAPICAL;  Surgeon: [name], MD;  Location: [facility] OR;  Service: Open Heart Surgery;  Laterality: N/A;  . TRANSURETHRAL RESECTION OF PROSTATE  2012     reports that he quit smoking about 56 years ago. His smoking use included cigarettes. He has a 18.50 pack-year smoking history. He has quit using smokeless tobacco.  His smokeless tobacco use included chew. He reports that he does not drink alcohol or use drugs.  Allergies  Allergen Reactions  . Ciprofloxacin Other (See Comments)    REACTION: mild delirium    Family History  Problem Relation Age of Onset  . Heart attack Mother   . Heart disease Mother        Before age 29  . Hyperlipidemia Mother   . Hypertension Mother   . Hypertension Son   . Heart disease Father        AAA-   . Hyperlipidemia Father   . Heart attack Father   . Hypertension Father   . Heart disease Brother        Before age 71  . Hyperlipidemia Brother   . Heart attack Brother   . Hypertension Brother      Prior to Admission medications   Medication Sig Start Date End Date Taking? Authorizing Provider  acetaminophen (TYLENOL) 500 MG tablet Take 1,000 mg by mouth 2 (two) times daily.   Yes [provider]  amLODipine (NORVASC) 5 MG tablet Take 5 mg by mouth daily.   Yes [provider]  aspirin EC 81 MG EC tablet Take 1 tablet (81 mg total) by mouth daily. 01/21/13  Yes Doree Fudge M, PA-C  atorvastatin (LIPITOR) 20 MG tablet Take 20 mg by mouth daily.   Yes [provider]  carvedilol (COREG) 6.25 MG tablet Take 1 tablet (6.25 mg total) by  mouth 2 (two) times daily with a meal. 10/21/15  Yes Lyn Records, MD  furosemide (LASIX) 20 MG tablet TAKE 2 TABLETS BY MOUTH EVERY DAY Patient taking differently: Take 40 mg by mouth daily.  01/15/19  Yes Lyn Records, MD  losartan (COZAAR) 25 MG tablet Take 25 mg by mouth daily. 03/19/18  Yes [provider]  Calcium Carbonate Antacid (TUMS PO) Take 1 tablet by mouth 2 (two) times daily as needed (heartburn/indigestion).    [provider]  clopidogrel (PLAVIX) 75 MG tablet TAKE 1 TABLET BY MOUTH EVERY DAY Patient taking differently: Take 75 mg by mouth daily.  11/12/18   Leone Brand, NP  feeding supplement, ENSURE ENLIVE, (ENSURE ENLIVE) LIQD Take 237 mLs by mouth 3 (three) times daily  between meals. 03/27/18   Dhungel, Theda Belfast, MD  fluticasone (FLONASE) 50 MCG/ACT nasal spray Place 1 spray into both nostrils at bedtime as needed for allergies or rhinitis.    [provider]  folic acid (FOLVITE) 1 MG tablet Take 3 mg by mouth daily.    [provider]  levothyroxine (SYNTHROID, LEVOTHROID) 100 MCG tablet Take 100 mcg by mouth daily before breakfast. 05/09/18   [provider]  methotrexate 2.5 MG tablet Take 25 mg by mouth every Friday.     [provider]  Multiple Vitamin (MULTIVITAMIN WITH MINERALS) TABS tablet Take 1 tablet by mouth daily. 03/27/18   Dhungel, Theda Belfast, MD  nitroGLYCERIN (NITROSTAT) 0.4 MG SL tablet Place 0.4 mg under the tongue every 5 (five) minutes as needed for chest pain.    [provider]  predniSONE (DELTASONE) 5 MG tablet Take 5 mg by mouth daily. 04/07/15   [provider]  vitamin B-12 (CYANOCOBALAMIN) 1000 MCG tablet Take 1 tablet (1,000 mcg total) by mouth daily. 03/27/18   Eddie North, MD    Physical Exam: Vitals:   03/30/19 1115 03/30/19 1130 03/30/19 1200 03/30/19 1220  BP: (!) 107/53 105/68  105/68  Pulse: 62 61  72  Resp: (!) 21 (!) 23  19  Temp:      TempSrc:        SpO2: 97% 100%  100%  Weight:   68.4 kg   Height:   5\' 7"  (1.702 m)     Constitutional: NAD, calm, comfortable Vitals:   03/30/19 1115 03/30/19 1130 03/30/19 1200 03/30/19 1220  BP: (!) 107/53 105/68  105/68  Pulse: 62 61  72  Resp: (!) 21 (!) 23  19  Temp:      TempSrc:      SpO2: 97% 100%  100%  Weight:   68.4 kg   Height:   5\' 7"  (1.702 m)    Eyes: PERRL, lids and conjunctivae normal ENMT: Mucous membranes are dry. Posterior pharynx clear of any exudate or lesions.Normal dentition.  Neck: normal, supple, no masses, no thyromegaly Respiratory: fine crackles bilaterally.  Increased respiratory effort. No accessory muscle use.  Cardiovascular: Regular rate and rhythm, muffled heart sound. 1+ extremity edema mainly on calfs. 2+ pedal pulses. No carotid bruits.  Abdomen: no tenderness, no masses palpated. No hepatosplenomegaly. Bowel sounds positive.  Musculoskeletal: no clubbing / cyanosis. No joint deformity upper and lower extremities. Good ROM, no contractures. Normal muscle tone.  Skin: no rashes, lesions, ulcers. No induration Neurologic: Following commands, moving all limbs  Psychiatric: Normal judgment and insight. Following commands. Normal mood.     Labs on Admission: I have personally reviewed following labs and imaging studies  CBC: Recent Labs  Lab 03/30/19 1046  WBC 9.9  NEUTROABS 5.4  HGB 8.6*  HCT 29.2*  MCV 91.5  PLT 181   Basic Metabolic Panel: Recent Labs  Lab 03/30/19 1046  NA 133*  K 5.1  CL 99  CO2 22  GLUCOSE 139*  BUN 78*  CREATININE 2.21*  CALCIUM 8.6*   GFR: Estimated Creatinine Clearance: 21.2 mL/min (A) (by C-G formula based on SCr of 2.21 mg/dL (H)). Liver Function Tests: Recent Labs  Lab 03/30/19 1046  AST 161*  ALT 105*  ALKPHOS 107  BILITOT 0.9  PROT 6.7  ALBUMIN 3.4*   Recent Labs  Lab 03/30/19 1046  LIPASE 36   No results for input(s): AMMONIA in the last 168 hours. Coagulation Profile: No results for  input(s):  INR, PROTIME in the last 168 hours. Cardiac Enzymes: No results for input(s): CKTOTAL, CKMB, CKMBINDEX, TROPONINI in the last 168 hours. BNP (last 3 results) No results for input(s): PROBNP in the last 8760 hours. HbA1C: No results for input(s): HGBA1C in the last 72 hours. CBG: No results for input(s): GLUCAP in the last 168 hours. Lipid Profile: No results for input(s): CHOL, HDL, LDLCALC, TRIG, CHOLHDL, LDLDIRECT in the last 72 hours. Thyroid Function Tests: No results for input(s): TSH, T4TOTAL, FREET4, T3FREE, THYROIDAB in the last 72 hours. Anemia Panel: No results for input(s): VITAMINB12, FOLATE, FERRITIN, TIBC, IRON, RETICCTPCT in the last 72 hours. Urine analysis:    Component Value Date/Time   COLORURINE YELLOW 02/18/2019 2240   APPEARANCEUR CLEAR 02/18/2019 2240   LABSPEC 1.010 02/18/2019 2240   PHURINE 5.0 02/18/2019 2240   GLUCOSEU NEGATIVE 02/18/2019 2240   HGBUR LARGE (A) 02/18/2019 2240   BILIRUBINUR NEGATIVE 02/18/2019 2240   KETONESUR NEGATIVE 02/18/2019 2240   PROTEINUR 30 (A) 02/18/2019 2240   UROBILINOGEN 4.0 (H) 11/14/2014 1849   NITRITE NEGATIVE 02/18/2019 2240   LEUKOCYTESUR NEGATIVE 02/18/2019 2240    Radiological Exams on Admission: DG Chest Portable 1 View  Result Date: 03/30/2019 CLINICAL DATA:  Cp and sob, low o2. Best obtainable due to pt condition. EXAM: PORTABLE CHEST 1 VIEW COMPARISON:  Chest radiograph 02/18/2019 FINDINGS: Stable cardiomediastinal contours status post median sternotomy and CABG. There are new mild diffuse hazy interstitial opacities which could represent trace edema or atypical infection. Chronic faint reticular opacities at the lung bases could represent a mild fibrosis. Possible small right pleural effusion. IMPRESSION: Very mild diffuse interstitial opacities could represent trace pulmonary edema or atypical infection. Possible tiny right pleural effusion. Chronic changes at the bilateral lung bases. Electronically  Signed   By: Emmaline Kluver M.D.   On: 03/30/2019 10:43    EKG: Independently reviewed. LBBB chronic, first-degree AV block  Assessment/Plan Active Problems:   CHF (congestive heart failure) (HCC)  Acute on chronic systolic CHF decompensation, tach and symptoms of increasing short of breath x-ray showed fluid overload physical exam showed increasing peripheral edema and and bilateral crackles indicating systolic CHF decompensation, received Lasix 80 mg in the ED, will change to Lasix 40 mg IV twice daily.  Cardiologist on board and will see the patient soon.  Was discussed with patient's son [name], all questions answered to my best knowledge.  Type II MI, recent cardiac cath in 2000 and showed multiple bypass vessels critically stenosed, cardiologist at that time made the decision for medical management given the patient advanced age.  Patient is unaware.  Discussed with patient's son regarding patient CODE STATUS, as per patient's son patient had a living will in which he wished for full code.  Continue aspirin Plavix and beta-blocker statin, hold ACE inhibitor for elevated kidney function.  Sent one troponin tonight and one tomorrow, may need anticoagulation.  AKI on CKD stage III, has signs of fluid overload suspect cardiorenal syndrome we'll continue diuresis and monitor kidney function.  Acute on chronic anemia, send iron study.  Rheumatoid arthritis, methotrexate will be held, continue prednisone.  Hypothyroidism, continue Synthroid.  CAD with CABG, as above.   DVT prophylaxis: Heparin subcu Code Status: Full code  family Communication: Son Genelle Bal Disposition Plan: Consider PT evaluation after CHF sufficiently treated Consults stenosis: Cardiology  admission status: Telemetry  Emeline General MD [facility] Pager [phone]  If 7PM-7AM, please contact night-coverage [email or web address] Password [email or web address]  03/30/2019, 1:12 PM

## 2019-03-30 NOTE — ED Notes (Signed)
EDP [place] informed of pt's critical troponin level of 2,898.

## 2019-03-30 NOTE — Consult Note (Signed)
Cardiology Consultation:  Patient ID: Hunter Choi MRN: [id number]; DOB: [birth year]  Admit date: 03/30/2019 Date of Consult: 03/30/2019  Primary Care Provider: [name], MD Primary Cardiologist: Lesleigh Noe, MD   Patient Profile:  Hunter Choi is a 86 y.o. male with a hx of CAD status post CABG, systolic heart failure ejection fraction 30 to 35%, rheumatoid arthritis, CKD, hypertension who is being seen today for the evaluation of shortness of breath volume overload at the request of [employer], MD.  History of Present Illness:  Hunter Choi presents with 2 weeks of shortness of breath.  The history is predominantly obtained by his son by phone.  Hunter Choi is quite confused and sleepy at the time my examination.  His son reports for the past 2 weeks he has been having worsening edema in his lower extremity as well as shortness of breath.  He also reports cough.  They also report decreased appetite and nausea.  For the past 4 days they have tried to increase his Lasix to 80 mg once daily.  This is not helped with fluid.  In the emergency room, he was noted to be hypoxic on room air down to 89%.  He was placed on 2 L nasal cannula with improvement.  BNP is greater than 4500, troponin is elevated at 2800 trending down to 2600.  Kidney function slightly worse than his prior mission up to 2.2.  EKG demonstrates left bundle branch block with wide QRS.  There are no acute changes from his EKG from prior tracings.  Chest x-ray also demonstrated pulmonary edema.  Overall, findings are concerning for acute decompensated systolic heart failure.  Complicated by acute kidney injury and elevated troponin.  He was given 80 mg of IV Lasix in the emergency room.  Hospital medicine has admitted the patient.  Heart Pathway Score:       Past Medical History: Past Medical History:  Diagnosis Date  . AAA (abdominal aortic aneurysm) (HCC)    Dr. Ashley Royalty  . Aortic stenosis, severe    a. s/p  TAVR  . Blind left eye   . BPH (benign prostatic hypertrophy)    Dr. Retta Diones  . CAD (coronary artery disease) of bypass graft    Chronic occlusion of saphenous vein graft to RCA  . Coronary artery disease    Left main disease and severe 3-vessel CAD  . Diastolic heart failure (HCC)   . Embolism involving retinal artery   . GERD (gastroesophageal reflux disease)   . Hard of hearing   . Hypertension   . Hypothyroidism   . Iliac artery aneurysm (HCC)    Dr. Edilia Bo  . PAF (paroxysmal atrial fibrillation) (HCC)   . 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 12/2012: mm Stephannie Peters XT transcatheter heart valve placed via transapical approach  . Stroke [facility])    mini stroke effective his eye left    Past Surgical History: Past Surgical History:  Procedure Laterality Date  . BIOPSY PROSTATE  2005  . BUNIONECTOMY WITH HAMMERTOE RECONSTRUCTION Bilateral   . CARDIAC CATHETERIZATION    . CARDIAC CATHETERIZATION N/A 05/07/2015   Procedure: Right Heart Cath and Coronary/Graft Angiography;  Surgeon: Lyn Records, MD;  Location: [facility] INVASIVE CV LAB;  Service: Cardiovascular;  Laterality: N/A;  . CAROTID ENDARTERECTOMY Right Jan. 2, 1997  . CATARACT EXTRACTION W/ INTRAOCULAR LENS IMPLANT Right   . CHOLECYSTECTOMY N/A 01/28/2015   Procedure: LAPAROSCOPIC CHOLECYSTECTOMY WITH INTRAOPERATIVE CHOLANGIOGRAM;  Surgeon: Gaynelle Adu, MD;  Location: [facility] OR;  Service: General;  Laterality: N/A;  . COLONOSCOPY    . CORONARY ARTERY BYPASS GRAFT  01/1998   Dr. Sheliah Plane; LIMA to LAD, SVG to D1, SVG to LCx, SVG to RCA, open saphenous vein harvest via right lower extremity  . HARVEST BONE GRAFT  1982   FOR THE  ANKLE  . HEMIARTHROPLASTY HIP Left    AFTER HIP FX  . INTRAOPERATIVE TRANSESOPHAGEAL ECHOCARDIOGRAM N/A 01/15/2013   Procedure: INTRAOPERATIVE TRANSESOPHAGEAL ECHOCARDIOGRAM;  Surgeon: [name], MD;  Location: [facility] OR;  Service: Open Heart Surgery;  Laterality:  N/A;  . JOINT REPLACEMENT    . LEFT AND RIGHT HEART CATHETERIZATION WITH CORONARY ANGIOGRAM N/A 12/20/2012   Procedure: LEFT AND RIGHT HEART CATHETERIZATION WITH CORONARY ANGIOGRAM;  Surgeon: Lesleigh Noe, MD;  Location: [facility] CATH LAB;  Service: Cardiovascular;  Laterality: N/A;  . LEFT HEART CATH AND CORS/GRAFTS ANGIOGRAPHY N/A 02/14/2017   Procedure: LEFT HEART CATH AND CORS/GRAFTS ANGIOGRAPHY;  Surgeon: Marykay Lex, MD;  Location: [facility] INVASIVE CV LAB;  Service: Cardiovascular;  Laterality: N/A;  . SKIN GRAFTS  1968  . TOTAL HIP ARTHROPLASTY Left 2007  . TOTAL KNEE ARTHROPLASTY  1992-2002   right-left  . TRANSCATHETER AORTIC VALVE REPLACEMENT, TRANSAPICAL N/A 01/15/2013   Procedure: TRANSCATHETER AORTIC VALVE REPLACEMENT, TRANSAPICAL;  Surgeon: [name], MD;  Location: [facility] OR;  Service: Open Heart Surgery;  Laterality: N/A;  . TRANSURETHRAL RESECTION OF PROSTATE  2012     Home Medications:  Prior to Admission medications   Medication Sig Start Date End Date Taking? Authorizing Provider  acetaminophen (TYLENOL) 500 MG tablet Take 1,000 mg by mouth 2 (two) times daily.   Yes [provider]  aspirin EC 81 MG EC tablet Take 1 tablet (81 mg total) by mouth daily. 01/21/13  Yes Doree Fudge M, PA-C  atorvastatin (LIPITOR) 20 MG tablet Take 20 mg by mouth daily.   Yes [provider]  Calcium Carbonate Antacid (TUMS PO) Take 1 tablet by mouth 2 (two) times daily as needed (heartburn/indigestion).   Yes [provider]  carvedilol (COREG) 6.25 MG tablet Take 1 tablet (6.25 mg total) by mouth 2 (two) times daily with a meal. 10/21/15  Yes Lyn Records, MD  clopidogrel (PLAVIX) 75 MG tablet TAKE 1 TABLET BY MOUTH EVERY DAY Patient taking differently: Take 75 mg by mouth daily.  11/12/18  Yes Leone Brand, NP  feeding supplement, ENSURE ENLIVE, (ENSURE ENLIVE) LIQD Take 237 mLs by mouth 3 (three) times daily between meals. 03/27/18  Yes Dhungel,  Nishant, MD  folic acid (FOLVITE) 1 MG tablet Take 3 mg by mouth daily.   Yes [provider]  furosemide (LASIX) 20 MG tablet TAKE 2 TABLETS BY MOUTH EVERY DAY Patient taking differently: Take 40 mg by mouth daily.  01/15/19  Yes Lyn Records, MD  levothyroxine (SYNTHROID, LEVOTHROID) 100 MCG tablet Take 100 mcg by mouth daily before breakfast. 05/09/18  Yes [provider]  losartan (COZAAR) 25 MG tablet Take 25 mg by mouth daily. 03/19/18  Yes [provider]  methotrexate 2.5 MG tablet Take 25 mg by mouth every Friday.    Yes [provider]  Multiple Vitamin (MULTIVITAMIN WITH MINERALS) TABS tablet Take 1 tablet by mouth daily. 03/27/18  Yes Dhungel, Nishant, MD  nitroGLYCERIN (NITROSTAT) 0.4 MG SL tablet Place 0.4 mg under the tongue every 5 (five) minutes as needed for chest pain.  Yes [provider]  predniSONE (DELTASONE) 5 MG tablet Take 5 mg by mouth daily. 04/07/15  Yes [provider]  vitamin B-12 (CYANOCOBALAMIN) 1000 MCG tablet Take 1 tablet (1,000 mcg total) by mouth daily. 03/27/18  Yes Dhungel, Nishant, MD    Inpatient Medications: Scheduled Meds: . acetaminophen  1,000 mg Oral BID  . [START ON 03/31/2019] aspirin  81 mg Oral Daily  . [START ON 03/31/2019] atorvastatin  20 mg Oral Daily  . carvedilol  6.25 mg Oral BID WC  . clopidogrel  75 mg Oral Daily  . feeding supplement (ENSURE ENLIVE)  237 mL Oral TID BM  . folic acid  3 mg Oral Daily  . furosemide  40 mg Intravenous Q12H  . heparin  5,000 Units Subcutaneous Q12H  . [START ON 03/31/2019] levothyroxine  100 mcg Oral QAC breakfast  . multivitamin with minerals  1 tablet Oral Daily  . predniSONE  5 mg Oral Daily  . sodium chloride flush  3 mL Intravenous Q12H  . vitamin B-12  1,000 mcg Oral Daily   Continuous Infusions: . sodium chloride     PRN Meds: sodium chloride, acetaminophen, calcium carbonate, fluticasone, nitroGLYCERIN, ondansetron (ZOFRAN) IV, sodium  chloride flush  Allergies:    Allergies  Allergen Reactions  . Ciprofloxacin Other (See Comments)    REACTION: mild delirium    Social History:   Social History   Socioeconomic History  . Marital status: Married    Spouse name: Not on file  . Number of children: Not on file  . Years of education: Not on file  . Highest education level: Not on file  Occupational History  . Occupation: Retired  Tobacco Use  . Smoking status: Former Smoker    Packs/day: 0.50    Years: 37.00    Pack years: 18.50    Types: Cigarettes    Quit date: 03/29/1963    Years since quitting: 56.0  . Smokeless tobacco: Former Neurosurgeon    Types: Chew  Substance and Sexual Activity  . Alcohol use: No  . Drug use: No  . Sexual activity: Not on file  Other Topics Concern  . Not on file  Social History Narrative  . Not on file   Social Determinants of Health   Financial Resource Strain:   . Difficulty of Paying Living Expenses: Not on file  Food Insecurity:   . Worried About Programme researcher, broadcasting/film/video in the Last Year: Not on file  . Ran Out of Food in the Last Year: Not on file  Transportation Needs:   . Lack of Transportation (Medical): Not on file  . Lack of Transportation (Non-Medical): Not on file  Physical Activity:   . Days of Exercise per Week: Not on file  . Minutes of Exercise per Session: Not on file  Stress:   . Feeling of Stress : Not on file  Social Connections:   . Frequency of Communication with Friends and Family: Not on file  . Frequency of Social Gatherings with Friends and Family: Not on file  . Attends Religious Services: Not on file  . Active Member of Clubs or Organizations: Not on file  . Attends Banker Meetings: Not on file  . Marital Status: Not on file  Intimate Partner Violence:   . Fear of Current or Ex-Partner: Not on file  . Emotionally Abused: Not on file  . Physically Abused: Not on file  . Sexually Abused: Not on file     Family  History:    Family  History  Problem Relation Age of Onset  . Heart attack Mother   . Heart disease Mother        Before age 12  . Hyperlipidemia Mother   . Hypertension Mother   . Hypertension Son   . Heart disease Father        AAA-   . Hyperlipidemia Father   . Heart attack Father   . Hypertension Father   . Heart disease Brother        Before age 88  . Hyperlipidemia Brother   . Heart attack Brother   . Hypertension Brother      ROS:  All other ROS reviewed and negative. Pertinent positives noted in the HPI.     Physical Exam/Data:   Vitals:   03/30/19 1411 03/30/19 1412 03/30/19 1430 03/30/19 1500  BP:   (!) 111/53 (!) 113/55  Pulse: (!) 58 (!) 59 62 (!) 39  Resp: 20 20 (!) 22 18  Temp:      TempSrc:      SpO2: 97% 97% 98% (!) 89%  Weight:      Height:       No intake or output data in the 24 hours ending 03/30/19 1503  Last 3 Weights 03/30/2019 02/21/2019 02/20/2019  Weight (lbs) 150 lb 12.7 oz 150 lb 12.7 oz 148 lb 9.4 oz  Weight (kg) 68.4 kg 68.4 kg 67.4 kg    Body mass index is 23.62 kg/m.  General: Ill-appearing, confused Head: Atraumatic, normal size  Eyes: PEERLA, EOMI  Neck: JVD noted to be around 15-18 cm of water Endocrine: No thryomegaly Cardiac: Normal S1, S2; RRR; no murmurs, rubs, or gallops Lungs: Crackles present the lung bases Abd: Soft, distended abdomen Ext: No edema, pulses 2+ Musculoskeletal: No deformities, BUE and BLE strength normal and equal Skin: Warm and dry, no rashes   Neuro: Alert, awake oriented to person only  EKG:  The EKG was personally reviewed and demonstrates: Normal sinus rhythm with left bundle branch block, heart rate around 63 Telemetry:  Telemetry was personally reviewed and demonstrates: Sinus rhythm with heart rate in the 60s  Relevant CV Studies: TTE 02/19/2019  1. Left ventricular ejection fraction, by visual estimation, is 30 to 35%. The left ventricle has moderate to severely decreased function. There is mildly increased left  ventricular hypertrophy.  2. Moderately dilated left ventricular internal cavity size.  3. The left ventricle demonstrates regional wall motion abnormalities. There is global hypokinesis and inferolateral akinesis. The apex is not seen.  4. Global right ventricle has moderately reduced systolic function.The right ventricular size is moderately enlarged. Right vetricular wall thickness was not assessed.  5. TR signal is inadequate for assessing pulmonary artery systolic pressure.  6. The tricuspid valve is grossly normal. Tricuspid valve regurgitation is not demonstrated.  7. Left atrial size was severely dilated.  8. Right atrial size was not well visualized.  9. The aortic root was not well visualized. 10. The interatrial septum was not assessed. 11. Aortic valve regurgitation is not visualized. 12. TAVR gradients were not assessed. 13. The mitral valve is grossly normal. No evidence of mitral valve regurgitation. 14. The pulmonic valve was not well visualized. Pulmonic valve regurgitation is not visualized. 15. Left ventricular diastolic function could not be evaluated. 16. Only parasternal views obtained before the patient refused to continue the study.  LHC 02/14/2017  Mid LM to Ost LAD lesion is 65% stenosed.  Ost Cx to Prox Cx  lesion is 90% stenosed. Mid Cx lesion is 100% stenosed.  Ost LAD to Prox LAD lesion is 99% stenosed. Prox LAD to Mid LAD lesion is 100% stenosed.  LIMA-LAD graft is widely patent  Ost 1st Diag lesion is 99% stenosed.  SVG-Diag1/Ramus -very large, patulous/ectatic vessel. The grafted vessel is smooth without disease.  SVG-OM - prox Graft lesion is 70% focal, thrombotic lesion in a valve segment.  Prox RCA lesion is 75% stenosed. Dist RCA lesion is 100% stenosed with 100% stenosed side branch in Post Atrio.  SVG-distal RCA origin lesion is 100% stenosed.  There is severe left ventricular systolic dysfunction.  LV end diastolic pressure is moderately  elevated.  The left ventricular ejection fraction is 25-35% by visual estimate.  Laboratory Data: High Sensitivity Troponin:   Recent Labs  Lab 03/30/19 1046 03/30/19 1225  TROPONINIHS 2,898* 2,687*     Cardiac EnzymesNo results for input(s): TROPONINI in the last 168 hours. No results for input(s): TROPIPOC in the last 168 hours.  Chemistry Recent Labs  Lab 03/30/19 1046  NA 133*  K 5.1  CL 99  CO2 22  GLUCOSE 139*  BUN 78*  CREATININE 2.21*  CALCIUM 8.6*  GFRNONAA 25*  GFRAA 30*  ANIONGAP 12    Recent Labs  Lab 03/30/19 1046  PROT 6.7  ALBUMIN 3.4*  AST 161*  ALT 105*  ALKPHOS 107  BILITOT 0.9   Hematology Recent Labs  Lab 03/30/19 1046 03/30/19 1409  WBC 9.9  --   RBC 3.19* 3.18*  HGB 8.6*  --   HCT 29.2*  --   MCV 91.5  --   MCH 27.0  --   MCHC 29.5*  --   RDW 18.2*  --   PLT 181  --    BNP Recent Labs  Lab 03/30/19 1046  BNP >4,500.0*    DDimer  Recent Labs  Lab 03/30/19 1350  DDIMER 17.53*    Radiology/Studies:  DG Chest Portable 1 View  Result Date: 03/30/2019 CLINICAL DATA:  Cp and sob, low o2. Best obtainable due to pt condition. EXAM: PORTABLE CHEST 1 VIEW COMPARISON:  Chest radiograph 02/18/2019 FINDINGS: Stable cardiomediastinal contours status post median sternotomy and CABG. There are new mild diffuse hazy interstitial opacities which could represent trace edema or atypical infection. Chronic faint reticular opacities at the lung bases could represent a mild fibrosis. Possible small right pleural effusion. IMPRESSION: Very mild diffuse interstitial opacities could represent trace pulmonary edema or atypical infection. Possible tiny right pleural effusion. Chronic changes at the bilateral lung bases. Electronically Signed   By: Audie Pinto M.D.   On: 03/30/2019 10:43    Assessment and Plan:  1. Acute decompensated systolic heart failure -He appears grossly volume overloaded with elevated BNP.  He has failed diuretic therapy  at home per the son's report.  I would recommend to continue 80 mg of IV Lasix twice daily.  Would aim for 2 to 3 L of net negative fluid removal daily. -Continue home beta-blocker, Coreg 6.25 mg twice daily -Hold home Cozaar.  He may not be a good candidate for this moving forward.  Want to see where his kidney function ultimately ends. -Strict I's and O's -Fluid restriction 1500 cc daily -Please obtain a repeat echocardiogram -Palliative care is likely appropriate given the recent admission for heart failure now readmitted nearly 1 month later.  Would recommend they see him while here.  2.  Elevated troponin/demand ischemia in the setting of decompensated systolic heart failure -  No chest pain or EKG changes concerning for acute coronary syndrome -Likely due to elevated the setting of decompensated systolic heart failure -Troponins trending down no need for further testing -He has known CAD as well as status post CABG with vein grafts that are occluded -We will continue home aspirin statin as well as Plavix  3.  CAD status post CABG -No symptoms of angina and no concerns for ACS -Continue aspirin, Plavix, statin -Prior Films reviewed, really no good targets for anything    For questions or updates, please contact [facility] Please consult [email or web address] for contact info under   Signed, Gerri Spore T. Flora Lipps, MD [facility]  03/30/2019 3:03 PM

## 2019-03-30 NOTE — ED Notes (Signed)
Pt fell asleep right after removing his nasal cannula & he desated to 86% on RA. When approached by this RN & the EMT Mellody Dance) pt would allow the cannula to be placed on his head a few times, but would continue to remove it from his face. This RN explained the importance of wearing the O2 & his HOH made it difficult to communicate with pt, the pt continued to refuse the O2 d/t it "tickling" his nose.

## 2019-03-31 ENCOUNTER — Inpatient Hospital Stay (HOSPITAL_COMMUNITY): Payer: Medicare Other

## 2019-03-31 DIAGNOSIS — I5043 Acute on chronic combined systolic (congestive) and diastolic (congestive) heart failure: Secondary | ICD-10-CM

## 2019-03-31 LAB — BASIC METABOLIC PANEL
Anion gap: 14 (ref 5–15)
BUN: 86 mg/dL — ABNORMAL HIGH (ref 8–23)
CO2: 22 mmol/L (ref 22–32)
Calcium: 8.6 mg/dL — ABNORMAL LOW (ref 8.9–10.3)
Chloride: 102 mmol/L (ref 98–111)
Creatinine, Ser: 2.45 mg/dL — ABNORMAL HIGH (ref 0.61–1.24)
GFR calc Af Amer: 26 mL/min — ABNORMAL LOW (ref >60)
GFR calc non Af Amer: 22 mL/min — ABNORMAL LOW (ref >60)
Glucose, Bld: 79 mg/dL (ref 70–99)
Potassium: 4.5 mmol/L (ref 3.5–5.1)
Sodium: 138 mmol/L (ref 135–145)

## 2019-03-31 MED ORDER — FUROSEMIDE 10 MG/ML IJ SOLN
80.0000 mg | Freq: Two times a day (BID) | INTRAMUSCULAR | Status: DC
  Administered 2019-03-31: 80 mg via INTRAVENOUS
  Filled 2019-03-31 (×2): qty 8

## 2019-03-31 MED ORDER — GUAIFENESIN-DM 100-10 MG/5ML PO SYRP
5.0000 mL | ORAL_SOLUTION | ORAL | Status: DC | PRN
  Administered 2019-04-01 – 2019-04-06 (×2): 5 mL via ORAL
  Filled 2019-03-31 (×4): qty 5

## 2019-03-31 MED ORDER — FUROSEMIDE 10 MG/ML IJ SOLN
120.0000 mg | Freq: Two times a day (BID) | INTRAVENOUS | Status: DC
  Filled 2019-03-31 (×3): qty 12

## 2019-03-31 NOTE — Progress Notes (Addendum)
PROGRESS NOTE    Hunter Choi  VAN:[id number] DOB: 10/26/1927 DOA: 03/30/2019 PCP: [name], MD     Brief Narrative:   86 year old male with history of CAD/CABG, diastolic congestive heart failure, aortic stenosis s/p aortic valve replacement, paroxysmal atrial fib, AAA, rheumatoid arthritis on chronic methotrexate and prednisone, CVA with left eye deficits  Presented to ED 03/30/19 with complaints of shortness of breath and weight gain and altered mental status, ongoing about 2 weeks, failed to respond to outpatient escalation in diuretic therapy with Lasix, patient's son is primary caretaker.  ER course: volume overload versus pneumonia on chest x-ray, more likely volume overload.  Elevated troponins were trending down, EKG was not particularly concerning.  Cr reduced from baseline. cardiology was consulted, advised against heparin drip elevated troponin is most likely due to demand ischemia/decompensated heart failure and in the setting of renal insufficiency, okay to leave on aspirin and Plavix and treat for CHF exacerbation Lasix 80 mg IV twice daily.  Echocardiogram 03/30/19: EF 25 to 30%, severely decreased left ventricular function, grade 3 diastolic dysfunction, right ventricle moderately reduced systolic function and moderate enlargement, moderate to severe mitral valve regurg, dilated IVC ~NO significant change from prior study.   Today 03/31/19: History difficult to obtain, patient is somnolent, he is arousable but not very communicative.  Output is inadequate, weight down by about 1 pound.  Cardiology reports that prognosis is poor, agree with palliative care consultation.  Continue IV Lasix 80 mg twice daily, hold Cozaar/Coreg, continue aspirin/Plavix/Lipitor. I rechecked patient this afternoon 4:00 PM. He's about the same, a bit more awake but not really able to converse with me.     Assessment & Plan:   Active Problems:   CHF (congestive heart failure)  (HCC)   Acute on chronic systolic heart failure.    Cardiology following, appreciate their recommendations.  Echocardiogram reveals LVEF 25 to 30% with grade 3 diastolic dysfunction and global hypokinesis.  Also has moderate RV dysfunction.    Diuresing on high-dose IV Lasix 80 milligrams twice daily but output inadequate and no significant clinical improvement    hold Cozaar/Coreg, continue aspirin/Plavix/Lipitor  Debility, likely end stage heart failure  Given poor prognosis, will consult palliative for discussion on goals of care, patient remains full code in the meantime, will try to reach out to son sometime today. I am unable to get much information from the patient on anything, unsure his mental baseline, certainly I'm not able to discuss w/ him directly re: his goals for QOL.   I attempted to call [name], patient's son, 4:14 PM and call went to voicemail. Nurse told me he might be here later today and I asked her to let me know if/when he arrives.   Elevated high-sensitivity troponin I  flat pattern in the 2000s per cardio is most likely associated with decompensated heart failure and in the setting of renal insufficiency.  Acute on chronic renal failure with CKD stage IIIb at baseline.    Creatinine 2.21  --> 2.4.    Potassium high normal --> WNL  Sodium 133 --> 138  Cozaar has been held for now.  Trend BMP  Anemia, mild iron deficiency, likely CKD component  Trend CBC   Other chronic cardiac issues: Status post TAVR, prosthetic function normal by echocardiogram with mild perivalvular leak, Moderate to severe mitral regurgitation, CAD status post CABG with plan for medical therapy given poor revascularization options (occluded grafts).        DVT prophylaxis: heparin q12h  Code Status: FULL Family Communication:  Disposition Plan: pending for now   Consultants:   Cardiology   Palliative Care  Procedures:  Echo 03/29/18  Antimicrobials:   Anti-infectives (From admission, onward)   None       Subjective: Patient ROS unable to obtain d/t mental status, unsure baseline   Objective: Vitals:   03/31/19 0035 03/31/19 0054 03/31/19 0537 03/31/19 0814  BP: (!) 110/55  (!) 100/48 (!) 106/54  Pulse: (!) 59  64 60  Resp: 20  20 14   Temp: 97.6 F (36.4 C)  (!) 97.4 F (36.3 C) 98.8 F (37.1 C)  TempSrc: Oral  Oral Oral  SpO2: 100% 96% 93% 98%  Weight:   71 kg   Height:        Intake/Output Summary (Last 24 hours) at 03/31/2019 1144 Last data filed at 03/31/2019 0940 Gross per 24 hour  Intake 100 ml  Output 775 ml  Net -675 ml   Filed Weights   03/30/19 1200 03/30/19 1801 03/31/19 0537  Weight: 68.4 kg 71.6 kg 71 kg    Examination:  General exam: Appears calm and comfortable  Respiratory system: (+)bilateral rales. Respiratory effort normal. Cardiovascular system: S1 & S2 heard, RRR. No JVD, +significan mitral murmurs, No rubs, gallops or clicks. No pedal edema. Gastrointestinal system: Abdomen is nondistended, soft and nontender. No organomegaly or masses felt. Normal bowel sounds heard. Central nervous system: Alert but not oriented. No focal neurological deficits. Extremities: Symmetric 4x 5 power. Skin: No rashes, lesions or ulcers Psychiatry: Judgement and insight appear limited. Mood & affect appropriate.     Data Reviewed: I have personally reviewed following labs and imaging studies  CBC: Recent Labs  Lab 03/30/19 1046  WBC 9.9  NEUTROABS 5.4  HGB 8.6*  HCT 29.2*  MCV 91.5  PLT 181   Basic Metabolic Panel: Recent Labs  Lab 03/30/19 1046 03/31/19 0813  NA 133* 138  K 5.1 4.5  CL 99 102  CO2 22 22  GLUCOSE 139* 79  BUN 78* 86*  CREATININE 2.21* 2.45*  CALCIUM 8.6* 8.6*   GFR: Estimated Creatinine Clearance: 19.1 mL/min (A) (by C-G formula based on SCr of 2.45 mg/dL (H)). Liver Function Tests: Recent Labs  Lab 03/30/19 1046  AST 161*  ALT 105*  ALKPHOS 107  BILITOT 0.9  PROT  6.7  ALBUMIN 3.4*   Recent Labs  Lab 03/30/19 1046  LIPASE 36   No results for input(s): AMMONIA in the last 168 hours. Coagulation Profile: No results for input(s): INR, PROTIME in the last 168 hours. Cardiac Enzymes: No results for input(s): CKTOTAL, CKMB, CKMBINDEX, TROPONINI in the last 168 hours. BNP (last 3 results) No results for input(s): PROBNP in the last 8760 hours. HbA1C: No results for input(s): HGBA1C in the last 72 hours. CBG: No results for input(s): GLUCAP in the last 168 hours. Lipid Profile: No results for input(s): CHOL, HDL, LDLCALC, TRIG, CHOLHDL, LDLDIRECT in the last 72 hours. Thyroid Function Tests: No results for input(s): TSH, T4TOTAL, FREET4, T3FREE, THYROIDAB in the last 72 hours. Anemia Panel: Recent Labs    03/30/19 1350 03/30/19 1409  FERRITIN 98  --   TIBC 361  --   IRON 22*  --   RETICCTPCT  --  3.2*   Urine analysis:    Component Value Date/Time   COLORURINE YELLOW 03/30/2019 2138   APPEARANCEUR HAZY (A) 03/30/2019 2138   LABSPEC 1.010 03/30/2019 2138   PHURINE 5.0 03/30/2019 2138   GLUCOSEU NEGATIVE  03/30/2019 2138   HGBUR SMALL (A) 03/30/2019 2138   BILIRUBINUR NEGATIVE 03/30/2019 2138   KETONESUR NEGATIVE 03/30/2019 2138   PROTEINUR NEGATIVE 03/30/2019 2138   UROBILINOGEN 4.0 (H) 11/14/2014 1849   NITRITE NEGATIVE 03/30/2019 2138   LEUKOCYTESUR MODERATE (A) 03/30/2019 2138   Sepsis Labs: @LABRCNTIP (procalcitonin:4,lacticidven:4)  Recent Results (from the past 240 hour(s))  Respiratory Panel by RT PCR (Flu A&B, Covid) - Nasopharyngeal Swab     Status: None   Collection Time: 03/30/19 11:23 AM   Specimen: Nasopharyngeal Swab  Result Value Ref Range Status   SARS Coronavirus 2 by RT PCR NEGATIVE NEGATIVE Final    Comment: (NOTE) SARS-CoV-2 target nucleic acids are NOT DETECTED. The SARS-CoV-2 RNA is generally detectable in upper respiratoy specimens during the acute phase of infection. The lowest concentration of  SARS-CoV-2 viral copies this assay can detect is 131 copies/mL. A negative result does not preclude SARS-Cov-2 infection and should not be used as the sole basis for treatment or other patient management decisions. A negative result may occur with  improper specimen collection/handling, submission of specimen other than nasopharyngeal swab, presence of viral mutation(s) within the areas targeted by this assay, and inadequate number of viral copies (<131 copies/mL). A negative result must be combined with clinical observations, patient history, and epidemiological information. The expected result is Negative. Fact Sheet for Patients:  05/28/19 Fact Sheet for Healthcare Providers:  https://www.moore.com/ This test is not yet ap proved or cleared by the https://www.young.biz/ FDA and  has been authorized for detection and/or diagnosis of SARS-CoV-2 by FDA under an Emergency Use Authorization (EUA). This EUA will remain  in effect (meaning this test can be used) for the duration of the COVID-19 declaration under Section 564(b)(1) of the Act, 21 U.S.C. section 360bbb-3(b)(1), unless the authorization is terminated or revoked sooner.    Influenza A by PCR NEGATIVE NEGATIVE Final   Influenza B by PCR NEGATIVE NEGATIVE Final    Comment: (NOTE) The Xpert Xpress SARS-CoV-2/FLU/RSV assay is intended as an aid in  the diagnosis of influenza from Nasopharyngeal swab specimens and  should not be used as a sole basis for treatment. Nasal washings and  aspirates are unacceptable for Xpert Xpress SARS-CoV-2/FLU/RSV  testing. Fact Sheet for Patients: [place] Fact Sheet for Healthcare Providers: https://www.moore.com/ This test is not yet approved or cleared by the https://www.young.biz/ FDA and  has been authorized for detection and/or diagnosis of SARS-CoV-2 by  FDA under an Emergency Use Authorization (EUA).  This EUA will remain  in effect (meaning this test can be used) for the duration of the  Covid-19 declaration under Section 564(b)(1) of the Act, 21  U.S.C. section 360bbb-3(b)(1), unless the authorization is  terminated or revoked. Performed at [facility], [address], [place], [place]          Radiology Studies last 96 hours: DG Chest Port 1 View  Result Date: 03/31/2019 CLINICAL DATA:  Dyspnea on exertion. EXAM: PORTABLE CHEST 1 VIEW COMPARISON:  March 30, 2019. FINDINGS: Stable cardiomegaly. Status post coronary artery bypass graft. Status post transcatheter aortic valve repair. Atherosclerosis of thoracic aorta is noted. No pneumothorax is noted. Mild bibasilar subsegmental atelectasis or edema or infiltrates are noted. No significant pleural effusion is noted. Bony thorax is unremarkable. IMPRESSION: Aortic atherosclerosis. Stable cardiomegaly. Mild bibasilar subsegmental atelectasis or infiltrates are noted concerning for edema or infiltrates. Electronically Signed   By: April 01, 2019 M.D.   On: 03/31/2019 09:21   DG  Chest Portable 1 View  Result Date: 03/30/2019 CLINICAL DATA:  Cp and sob, low o2. Best obtainable due to pt condition. EXAM: PORTABLE CHEST 1 VIEW COMPARISON:  Chest radiograph 02/18/2019 FINDINGS: Stable cardiomediastinal contours status post median sternotomy and CABG. There are new mild diffuse hazy interstitial opacities which could represent trace edema or atypical infection. Chronic faint reticular opacities at the lung bases could represent a mild fibrosis. Possible small right pleural effusion. IMPRESSION: Very mild diffuse interstitial opacities could represent trace pulmonary edema or atypical infection. Possible tiny right pleural effusion. Chronic changes at the bilateral lung bases. Electronically Signed   By: Emmaline Kluver M.D.   On: 03/30/2019 10:43   ECHOCARDIOGRAM LIMITED  Result Date: 03/30/2019   ECHOCARDIOGRAM LIMITED REPORT    Patient Name:   TRON FLYTHE Date of Exam: 03/30/2019 Medical Rec #:  [id number]      Height:       67.0 in Accession #:    [id number]     Weight:       150.8 lb Date of Birth:  1927-11-12      BSA:          1.79 m Patient Age:    89 years       BP:           108/62 mmHg Patient Gender: M              HR:           65 bpm. Exam Location:  Inpatient  Procedure: Limited Color Doppler, Cardiac Doppler and Limited Echo Indications:    acute systolic chf 428.21  History:        Patient has prior history of Echocardiogram examinations, most                 recent 02/19/2019. Aortic Valve: A 29mm Edwards Edwards Sapien                 bioprosthetic, stented aortic valve (TAVR)  Sonographer:    Delcie Roch Referring Phys: [id number] Merino THOMAS O'NEAL  Sonographer Comments: Image acquisition challenging due to uncooperative patient. IMPRESSIONS  1. Left ventricular ejection fraction, by visual estimation, is 25 to 30%. The left ventricle has severely decreased function. There is no increased left ventricular wall thickness.  2. Definity contrast agent was given IV to delineate the left ventricular endocardial borders.  3. Elevated left atrial pressure.  4. Left ventricular diastolic parameters are consistent with Grade III diastolic dysfunction (restrictive).  5. The left ventricle demonstrates global hypokinesis.  6. No LV thrombus.  7. Global right ventricle has moderately reduced systolic function.The right ventricular size is moderately enlarged. no increase in right ventricular wall thickness.  8. Left atrial size was severely dilated.  9. Presence of pericardial fat pad. 10. Mild mitral annular calcification. 11. The mitral valve is degenerative. Moderate to severe mitral valve regurgitation. 12. The tricuspid valve was grossly normal. Tricuspid valve regurgitation is mild. 13. Tricuspid valve regurgitation is mild. 14. 29 mm Sapien 3. Normal gradients. Mild paravalvular leak. 15. TR signal is inadequate for  assessing pulmonary artery systolic pressure. 16. The inferior vena cava is dilated in size with <50% respiratory variability, suggesting right atrial pressure of 15 mmHg. 17. A prior study was performed on 02/19/2019. 18. EF remains largely unchanged from prior. 19. No significant change from prior study. 20. The interatrial septum was not assessed. FINDINGS  Left Ventricle: Left ventricular ejection fraction, by visual estimation, is 25  to 30%. The left ventricle has severely decreased function. Definity contrast agent was given IV to delineate the left ventricular endocardial borders. The left ventricle demonstrates global hypokinesis. The left ventricular internal cavity size was the left ventricle is normal in size. There is no increased left ventricular wall thickness. Left ventricular diastolic parameters are consistent with Grade III diastolic dysfunction (restrictive). Elevated left atrial pressure. No LV thrombus. Right Ventricle: The right ventricular size is moderately enlarged. No increase in right ventricular wall thickness. Global RV systolic function is has moderately reduced systolic function. Left Atrium: Left atrial size was severely dilated. Right Atrium: Right atrial size was mild-moderately dilated. Right atrial pressure is estimated at 15 mmHg. Pericardium: There is no evidence of pericardial effusion is seen. There is no evidence of pericardial effusion. Presence of pericardial fat pad. Mitral Valve: The mitral valve is degenerative in appearance. Mild mitral annular calcification. MV Area by PHT, 4.39 cm. MV PHT, 50.17 msec. Moderate to severe mitral valve regurgitation. Tricuspid Valve: The tricuspid valve is grossly normal. Tricuspid valve regurgitation is mild. Aortic Valve: The aortic valve has been repaired/replaced. Aortic valve regurgitation is mild. Aortic valve mean gradient measures 9.0 mmHg. Aortic valve peak gradient measures 15.4 mmHg. Aortic valve area, by VTI measures 1.92  cm. 58mm Edwards Edwards Sapien bioprosthetic, stented aortic valve (TAVR) valve is present in the aortic position. Echo findings show normal structure and function of the aortic prosthesis. 29 mm Sapien 3. Normal gradients. Mild paravalvular leak. Pulmonic Valve: The pulmonic valve was grossly normal. Pulmonic valve regurgitation is mild by color flow Doppler. Pulmonic regurgitation is mild by color flow Doppler. Aorta: The aortic root is normal in size and structure. Venous: The inferior vena cava is dilated in size with less than 50% respiratory variability, suggesting right atrial pressure of 15 mmHg. Shunts: The interatrial septum was not assessed. Additional Comments: A prior study was performed on 02/19/2019.  LEFT VENTRICLE          Normals PLAX 2D LVIDd:         5.20 cm  3.6 cm   Diastology                 Normals LVIDs:         4.30 cm  1.7 cm   LV e' lateral:   6.74 cm/s 6.42 cm/s LV PW:         1.30 cm  1.4 cm   LV E/e' lateral: 19.1      15.4 LV IVS:        1.10 cm  1.3 cm   LV e' medial:    4.24 cm/s 6.96 cm/s LVOT diam:     2.00 cm  2.0 cm   LV E/e' medial:  30.4      6.96 LV SV:         46 ml    79 ml LV SV Index:   25.74    45 ml/m2 LVOT Area:     3.14 cm 3.14 cm2  LEFT ATRIUM           Index LA diam:      5.10 cm 2.84 cm/m LA Vol (A4C): 67.1 ml 37.41 ml/m  AORTIC VALVE                    Normals AV Area (Vmax):    1.91 cm AV Area (Vmean):   1.77 cm     3.06 cm2 AV Area (VTI):  1.92 cm AV Vmax:           196.50 cm/s AV Vmean:          138.000 cm/s 77 cm/s AV VTI:            0.376 m      3.15 cm2 AV Peak Grad:      15.4 mmHg AV Mean Grad:      9.0 mmHg     3 mmHg LVOT Vmax:         119.50 cm/s LVOT Vmean:        77.900 cm/s  75 cm/s LVOT VTI:          0.230 m      25.3 cm LVOT/AV VTI ratio: 0.61         1  AORTA                 Normals Ao Root diam: 2.80 cm 31 mm MITRAL VALVE              Normals MV Area (PHT): 4.39 cm              SHUNTS MV PHT:        50.17 msec 55 ms      Systemic VTI:   0.23 m MV Decel Time: 173 msec   187 ms     Systemic Diam: 2.00 cm MV E velocity: 129.00 cm/s 103 cm/s MV A velocity: 65.30 cm/s  70.3 cm/s MV E/A ratio:  1.98        1.5  Eleonore Chiquito MD Electronically signed by Eleonore Chiquito MD Signature Date/Time: 03/30/2019/6:43:27 PMThe mitral valve is degenerative in appearance.    Final          Scheduled Meds: . acetaminophen  1,000 mg Oral BID  . aspirin EC  81 mg Oral Daily  . atorvastatin  20 mg Oral Daily  . clopidogrel  75 mg Oral Daily  . feeding supplement (ENSURE ENLIVE)  237 mL Oral TID BM  . folic acid  3 mg Oral Daily  . furosemide  80 mg Intravenous Q12H  . heparin  5,000 Units Subcutaneous Q12H  . levothyroxine  100 mcg Oral QAC breakfast  . multivitamin with minerals  1 tablet Oral Daily  . predniSONE  5 mg Oral Daily  . sodium chloride flush  3 mL Intravenous Q12H  . vitamin B-12  1,000 mcg Oral Daily   Continuous Infusions: . sodium chloride       LOS: 1 day    Time spent: 35 min      [facility] Pager [phone] or secure message in [place]

## 2019-03-31 NOTE — Progress Notes (Signed)
Patient was able to take most of his morning medications today without any resistance.  However, patient kept trying to take his nasal cannula out of his nose.  Whenever patient takes his nasal cannula out of his nares, his SpO2 drop down to high 70 to low 80's.  He gets agitated and combative when we tried to put his nasal cannula back to his nares.

## 2019-03-31 NOTE — Progress Notes (Signed)
CSW acknowledges consult for SNF. The patient will require PT/OT evaluations. CSW will assist with disposition planning once the evaluations have been completed.    CSW will continue to follow.  

## 2019-03-31 NOTE — Progress Notes (Signed)
Progress Note  Patient Name: Hunter Choi Date of Encounter: 03/31/2019  Primary Cardiologist: Lesleigh Noe, MD  Subjective   Somnolent this morning, wearing protective mitts on his hands.  Inpatient Medications    Scheduled Meds: . acetaminophen  1,000 mg Oral BID  . aspirin EC  81 mg Oral Daily  . atorvastatin  20 mg Oral Daily  . carvedilol  6.25 mg Oral BID WC  . clopidogrel  75 mg Oral Daily  . feeding supplement (ENSURE ENLIVE)  237 mL Oral TID BM  . folic acid  3 mg Oral Daily  . heparin  5,000 Units Subcutaneous Q12H  . levothyroxine  100 mcg Oral QAC breakfast  . multivitamin with minerals  1 tablet Oral Daily  . predniSONE  5 mg Oral Daily  . sodium chloride flush  3 mL Intravenous Q12H  . vitamin B-12  1,000 mcg Oral Daily   Continuous Infusions: . sodium chloride    . furosemide     PRN Meds: sodium chloride, acetaminophen, calcium carbonate, fluticasone, guaiFENesin-dextromethorphan, nitroGLYCERIN, ondansetron (ZOFRAN) IV, sodium chloride flush   Vital Signs    Vitals:   03/31/19 0035 03/31/19 0054 03/31/19 0537 03/31/19 0814  BP: (!) 110/55  (!) 100/48 (!) 106/54  Pulse: (!) 59  64 60  Resp: 20  20 14   Temp: 97.6 F (36.4 C)  (!) 97.4 F (36.3 C) 98.8 F (37.1 C)  TempSrc: Oral  Oral Oral  SpO2: 100% 96% 93% 98%  Weight:   71 kg   Height:        Intake/Output Summary (Last 24 hours) at 03/31/2019 0849 Last data filed at 03/31/2019 0500 Gross per 24 hour  Intake 100 ml  Output 775 ml  Net -675 ml   Filed Weights   03/30/19 1200 03/30/19 1801 03/31/19 0537  Weight: 68.4 kg 71.6 kg 71 kg    Telemetry    Sinus rhythm.  Personally reviewed.  ECG    An ECG dated 03/30/2019 was personally reviewed today and demonstrated:  Sinus rhythm with prolonged PR interval and left bundle branch block.  Physical Exam   GEN:  Elderly male in no obvious distress.   Neck:  Elevated JVP. Cardiac: RRR, no murmur or gallop.  Respiratory:  Crackles  at the bases, no wheezing. GI: Nontender, bowel sounds present. MS: No edema; No deformity. Neuro:  Nonfocal. Psych: Alert and oriented x 1.  Labs    Chemistry Recent Labs  Lab 03/30/19 1046  NA 133*  K 5.1  CL 99  CO2 22  GLUCOSE 139*  BUN 78*  CREATININE 2.21*  CALCIUM 8.6*  PROT 6.7  ALBUMIN 3.4*  AST 161*  ALT 105*  ALKPHOS 107  BILITOT 0.9  GFRNONAA 25*  GFRAA 30*  ANIONGAP 12     Hematology Recent Labs  Lab 03/30/19 1046 03/30/19 1409  WBC 9.9  --   RBC 3.19* 3.18*  HGB 8.6*  --   HCT 29.2*  --   MCV 91.5  --   MCH 27.0  --   MCHC 29.5*  --   RDW 18.2*  --   PLT 181  --     Cardiac Enzymes Recent Labs  Lab 03/30/19 1046 03/30/19 1225 03/30/19 2028  TROPONINIHS 2,898* 2,687* 2,228*    BNP Recent Labs  Lab 03/30/19 1046  BNP >4,500.0*     DDimer Recent Labs  Lab 03/30/19 1350  DDIMER 17.53*     Radiology  DG Chest Portable 1 View  Result Date: 03/30/2019 CLINICAL DATA:  Cp and sob, low o2. Best obtainable due to pt condition. EXAM: PORTABLE CHEST 1 VIEW COMPARISON:  Chest radiograph 02/18/2019 FINDINGS: Stable cardiomediastinal contours status post median sternotomy and CABG. There are new mild diffuse hazy interstitial opacities which could represent trace edema or atypical infection. Chronic faint reticular opacities at the lung bases could represent a mild fibrosis. Possible small right pleural effusion. IMPRESSION: Very mild diffuse interstitial opacities could represent trace pulmonary edema or atypical infection. Possible tiny right pleural effusion. Chronic changes at the bilateral lung bases. Electronically Signed   By: Emmaline Kluver M.D.   On: 03/30/2019 10:43   ECHOCARDIOGRAM LIMITED  Result Date: 03/30/2019   ECHOCARDIOGRAM LIMITED REPORT   Patient Name:   Hunter Choi Date of Exam: 03/30/2019 Medical Rec #:  [id number]      Height:       67.0 in Accession #:    [id number]     Weight:       150.8 lb Date of Birth:   02-08-1928      BSA:          1.79 m Patient Age:    86 years       BP:           108/62 mmHg Patient Gender: M              HR:           65 bpm. Exam Location:  Inpatient  Procedure: Limited Color Doppler, Cardiac Doppler and Limited Echo Indications:    acute systolic chf 428.21  History:        Patient has prior history of Echocardiogram examinations, most                 recent 02/19/2019. Aortic Valve: A 50mm Edwards Edwards Sapien                 bioprosthetic, stented aortic valve (TAVR)  Sonographer:    Delcie Roch Referring Phys: [id number] [place] THOMAS O'NEAL  Sonographer Comments: Image acquisition challenging due to uncooperative patient. IMPRESSIONS  1. Left ventricular ejection fraction, by visual estimation, is 25 to 30%. The left ventricle has severely decreased function. There is no increased left ventricular wall thickness.  2. Definity contrast agent was given IV to delineate the left ventricular endocardial borders.  3. Elevated left atrial pressure.  4. Left ventricular diastolic parameters are consistent with Grade III diastolic dysfunction (restrictive).  5. The left ventricle demonstrates global hypokinesis.  6. No LV thrombus.  7. Global right ventricle has moderately reduced systolic function.The right ventricular size is moderately enlarged. no increase in right ventricular wall thickness.  8. Left atrial size was severely dilated.  9. Presence of pericardial fat pad. 10. Mild mitral annular calcification. 11. The mitral valve is degenerative. Moderate to severe mitral valve regurgitation. 12. The tricuspid valve was grossly normal. Tricuspid valve regurgitation is mild. 13. Tricuspid valve regurgitation is mild. 14. 29 mm Sapien 3. Normal gradients. Mild paravalvular leak. 15. TR signal is inadequate for assessing pulmonary artery systolic pressure. 16. The inferior vena cava is dilated in size with <50% respiratory variability, suggesting right atrial pressure of 15 mmHg. 17. A  prior study was performed on 02/19/2019. 18. EF remains largely unchanged from prior. 19. No significant change from prior study. 20. The interatrial septum was not assessed. FINDINGS  Left Ventricle: Left ventricular ejection fraction, by visual estimation, is  25 to 30%. The left ventricle has severely decreased function. Definity contrast agent was given IV to delineate the left ventricular endocardial borders. The left ventricle demonstrates global hypokinesis. The left ventricular internal cavity size was the left ventricle is normal in size. There is no increased left ventricular wall thickness. Left ventricular diastolic parameters are consistent with Grade III diastolic dysfunction (restrictive). Elevated left atrial pressure. No LV thrombus. Right Ventricle: The right ventricular size is moderately enlarged. No increase in right ventricular wall thickness. Global RV systolic function is has moderately reduced systolic function. Left Atrium: Left atrial size was severely dilated. Right Atrium: Right atrial size was mild-moderately dilated. Right atrial pressure is estimated at 15 mmHg. Pericardium: There is no evidence of pericardial effusion is seen. There is no evidence of pericardial effusion. Presence of pericardial fat pad. Mitral Valve: The mitral valve is degenerative in appearance. Mild mitral annular calcification. MV Area by PHT, 4.39 cm. MV PHT, 50.17 msec. Moderate to severe mitral valve regurgitation. Tricuspid Valve: The tricuspid valve is grossly normal. Tricuspid valve regurgitation is mild. Aortic Valve: The aortic valve has been repaired/replaced. Aortic valve regurgitation is mild. Aortic valve mean gradient measures 9.0 mmHg. Aortic valve peak gradient measures 15.4 mmHg. Aortic valve area, by VTI measures 1.92 cm. 24mm Edwards Edwards Sapien bioprosthetic, stented aortic valve (TAVR) valve is present in the aortic position. Echo findings show normal structure and function of the aortic  prosthesis. 29 mm Sapien 3. Normal gradients. Mild paravalvular leak. Pulmonic Valve: The pulmonic valve was grossly normal. Pulmonic valve regurgitation is mild by color flow Doppler. Pulmonic regurgitation is mild by color flow Doppler. Aorta: The aortic root is normal in size and structure. Venous: The inferior vena cava is dilated in size with less than 50% respiratory variability, suggesting right atrial pressure of 15 mmHg. Shunts: The interatrial septum was not assessed. Additional Comments: A prior study was performed on 02/19/2019.  LEFT VENTRICLE          Normals PLAX 2D LVIDd:         5.20 cm  3.6 cm   Diastology                 Normals LVIDs:         4.30 cm  1.7 cm   LV e' lateral:   6.74 cm/s 6.42 cm/s LV PW:         1.30 cm  1.4 cm   LV E/e' lateral: 19.1      15.4 LV IVS:        1.10 cm  1.3 cm   LV e' medial:    4.24 cm/s 6.96 cm/s LVOT diam:     2.00 cm  2.0 cm   LV E/e' medial:  30.4      6.96 LV SV:         46 ml    79 ml LV SV Index:   25.74    45 ml/m2 LVOT Area:     3.14 cm 3.14 cm2  LEFT ATRIUM           Index LA diam:      5.10 cm 2.84 cm/m LA Vol (A4C): 67.1 ml 37.41 ml/m  AORTIC VALVE                    Normals AV Area (Vmax):    1.91 cm AV Area (Vmean):   1.77 cm     3.06 cm2 AV Area (VTI):  1.92 cm AV Vmax:           196.50 cm/s AV Vmean:          138.000 cm/s 77 cm/s AV VTI:            0.376 m      3.15 cm2 AV Peak Grad:      15.4 mmHg AV Mean Grad:      9.0 mmHg     3 mmHg LVOT Vmax:         119.50 cm/s LVOT Vmean:        77.900 cm/s  75 cm/s LVOT VTI:          0.230 m      25.3 cm LVOT/AV VTI ratio: 0.61         1  AORTA                 Normals Ao Root diam: 2.80 cm 31 mm MITRAL VALVE              Normals MV Area (PHT): 4.39 cm              SHUNTS MV PHT:        50.17 msec 55 ms      Systemic VTI:  0.23 m MV Decel Time: 173 msec   187 ms     Systemic Diam: 2.00 cm MV E velocity: 129.00 cm/s 103 cm/s MV A velocity: 65.30 cm/s  70.3 cm/s MV E/A ratio:  1.98        1.5  Lennie Odor MD Electronically signed by Lennie Odor MD Signature Date/Time: 03/30/2019/6:43:27 PMThe mitral valve is degenerative in appearance.    Final     Patient Profile     86 y.o. male with a history of CAD status post CABG, ischemic cardiomyopathy, TAVR, rheumatoid arthritis, chronic kidney disease, and hypertension now presenting with acute on chronic systolic heart failure and volume overload.  Assessment & Plan    1.  Acute on chronic systolic heart failure.  Echocardiogram reveals LVEF 25 to 30% with grade 3 diastolic dysfunction and global hypokinesis.  Also has moderate RV dysfunction.  Diuresing on high-dose IV Lasix but output incomplete.  Weight down about 1 pound.  2.  Status post TAVR, prosthetic function normal by echocardiogram with mild perivalvular leak.  3.  Moderate to severe mitral regurgitation.  4.  Acute on chronic renal failure with CKD stage IIIb at baseline.  Creatinine 2.21.  Potassium high normal.  Cozaar has been discontinued for now.  5.  Elevated high-sensitivity troponin I, flat pattern in the 2000s most likely associated with decompensated heart failure and in the setting of renal insufficiency.  6.  CAD status post CABG with plan for medical therapy given poor revascularization options (occluded grafts).  Continues on aspirin, and statin.  I reviewed the consultation note by Dr. Flora Lipps.  Overall prognosis is poor, I would agree with considering palliative care consultation to define reasonable goals of care.  Plan to continue IV Lasix at 80 mg twice daily, agree with stopping Cozaar, will also hold Coreg.  Continue aspirin, Plavix, and Lipitor.  Signed, Nona Dell, MD  03/31/2019, 8:49 AM

## 2019-04-01 LAB — BASIC METABOLIC PANEL
Anion gap: 13 (ref 5–15)
BUN: 96 mg/dL — ABNORMAL HIGH (ref 8–23)
CO2: 24 mmol/L (ref 22–32)
Calcium: 8.3 mg/dL — ABNORMAL LOW (ref 8.9–10.3)
Chloride: 100 mmol/L (ref 98–111)
Creatinine, Ser: 2.74 mg/dL — ABNORMAL HIGH (ref 0.61–1.24)
GFR calc Af Amer: 23 mL/min — ABNORMAL LOW (ref >60)
GFR calc non Af Amer: 20 mL/min — ABNORMAL LOW (ref >60)
Glucose, Bld: 99 mg/dL (ref 70–99)
Potassium: 4.7 mmol/L (ref 3.5–5.1)
Sodium: 137 mmol/L (ref 135–145)

## 2019-04-01 LAB — CBC
HCT: 26.9 % — ABNORMAL LOW (ref 39.0–52.0)
Hemoglobin: 8 g/dL — ABNORMAL LOW (ref 13.0–17.0)
MCH: 26.8 pg (ref 26.0–34.0)
MCHC: 29.7 g/dL — ABNORMAL LOW (ref 30.0–36.0)
MCV: 90 fL (ref 80.0–100.0)
Platelets: 162 10*3/uL (ref 150–400)
RBC: 2.99 MIL/uL — ABNORMAL LOW (ref 4.22–5.81)
RDW: 18 % — ABNORMAL HIGH (ref 11.5–15.5)
WBC: 8.8 10*3/uL (ref 4.0–10.5)
nRBC: 0.9 % — ABNORMAL HIGH (ref 0.0–0.2)

## 2019-04-01 MED ORDER — ENSURE ENLIVE PO LIQD
237.0000 mL | Freq: Two times a day (BID) | ORAL | Status: DC
  Administered 2019-04-01 – 2019-04-03 (×5): 237 mL via ORAL

## 2019-04-01 MED ORDER — FUROSEMIDE 10 MG/ML IJ SOLN: 40.0000 mg | Freq: Two times a day (BID) | INTRAMUSCULAR | Status: DC

## 2019-04-01 MED ORDER — FUROSEMIDE 40 MG PO TABS
40.0000 mg | ORAL_TABLET | Freq: Two times a day (BID) | ORAL | Status: DC
  Administered 2019-04-01 – 2019-04-02 (×3): 40 mg via ORAL
  Filled 2019-04-01 (×3): qty 1

## 2019-04-01 MED ORDER — SODIUM CHLORIDE 0.9 % IV SOLN
2.0000 g | Freq: Every day | INTRAVENOUS | Status: DC
  Administered 2019-04-01 – 2019-04-02 (×2): 2 g via INTRAVENOUS
  Filled 2019-04-01: qty 2
  Filled 2019-04-01: qty 20

## 2019-04-01 NOTE — Progress Notes (Addendum)
PROGRESS NOTE    Hunter Choi  HYI:[id number] DOB: 1927/07/13 DOA: 03/30/2019 PCP: [name], MD   Narrative:   Date of Admission:03/30/2019  LOS: Day 26   86 year old male with history of CAD/CABG, diastolic congestive heart failure, aortic stenosis s/p aortic valve replacement, paroxysmal atrial fib, AAA, rheumatoid arthritis on chronic methotrexate and prednisone, CVA with left eye deficits  Presented to ED 03/30/19 with complaints of shortness of breath and weight gain and altered mental status, ongoing about 2 weeks, failed to respond to outpatient escalation in diuretic therapy with Lasix, patient's son is primary caretaker.  ER course: volume overload versus pneumonia on chest x-ray, more likely volume overload.  Elevated troponins were trending down, EKG was not particularly concerning.  Cr reduced from baseline. cardiology was consulted, advised against heparin drip elevated troponin is most likely due to demand ischemia/decompensated heart failure and in the setting of renal insufficiency, okay to leave on aspirin and Plavix and treat for CHF exacerbation Lasix 80 mg IV twice daily.  Echocardiogram 03/30/19: EF 25 to 30%, severely decreased left ventricular function, grade 3 diastolic dysfunction, right ventricle moderately reduced systolic function and moderate enlargement, moderate to severe mitral valve regurg, dilated IVC ~NO significant change from prior study.   03/31/19: History difficult to obtain, patient is somnolent, he is arousable but not very communicative.    Output is inadequate, weight down by about 1 pound.    Cardiology reports that prognosis is poor, agree with palliative care consultation.  Continue IV Lasix 80 mg twice daily, hold Cozaar/Coreg, continue aspirin/Plavix/Lipitor.   I rechecked patient this afternoon 4:00 PM. He's about the same, a bit more awake but not really able to converse with me.   Attempted to contact patient's son but he was  not available  Today 04/01/19: Patient about the same from yesterday 03/31/2019, with regard to mental status.  He is easily arousable/alert but seems confused and is not very communicative.  Overnight: no events per chart, Output still not impressive  Pending: clinical improvement  Monitoring labs: Na better, Cr worse,   (+)transaminitis   UCx (+)Ecoli - previous culture showed (S)Cipro though this was 09/2015, will start Rocephin per pharmacy recs   PT/OT to eval - pt lives at home with 2 sons . This morning, I reduced Lasix dose to 40 mg IV after nurse alrted me to systolic BP borderline, if BP stays ok next couple hours I certainly see no problem giving another 40 and then back up to 80 bid . Dispo: here today   . Visited again in PM, patient is much more alert, awake. He is HoH but can understand when we yell! Son present at bedside, all questions answered.    Assessment & Plan:   Active Problems:   CHF (congestive heart failure) (HCC)  Acute on chronic systolic heart failure.   Cardiology following, appreciate their recommendations.  Echocardiogram reveals LVEF 25 to 30% with grade 3 diastolic dysfunction and global hypokinesis. Also has moderate RV dysfunction.   Diuresing on high-dose IV Lasix 80 milligrams twice daily but output inadequate and no significant clinical improvement    hold Cozaar/Coreg, continue aspirin/Plavix/Lipitor  Cardiology has signed off for now  Discussion on goals of care Debility, likely end stage heart failure  Given poor prognosis, worth a frank discussion regarding goals of care.    I was able to speak with the patient's son, [name], who has been his primary caregiver at home for years.  The patient lives  at home with [name] and another son.  Family relations sound amicable.    I discussed with [name] that Hunter Choi is probably end-stage heart failure, I am concerned given that he remains full code as I think CPR/resuscitation efforts would  probably be futile in his case.    With regard to issues of quality of life, [name] tells me that typically, Hunter Choi is alert, talkative, happy - but he tends to become confused when he is in the hospital.  This has happened a couple of times.   The family has had discussion with regard to goals of care in terms of resuscitation, Hunter Choi had to deal with some of this when his wife was ill and she is deceased.  Per [name]'s report, Hunter Choi would want all resuscitative attempts made.  He does not have an advanced directive in place, however.   I advised [name] that resuscitation measures, particularly CPR, can be quite painful/traumatic, and I would not be very comfortable performing CPR on Hunter Choi given his frail health and unlikelihood of recovery to meaningful quality of life should he experience a cardiac arrest and require resuscitation.  [name] verbalizes understanding, but again affirms that [name] has clearly stated his wishes that everything be done in such an event.  Patient is stable for now, I think it would be worth revisiting depending on his clinical situation.  [name] was advised that in cases of futility, if the harms of a treatment/procedures such as CPR outweigh the benefits. Mr Choi may not be offered this procedure.  Consider invoking [place] futility policy if needed.   Encouraged completion of advanced directive.   Elevated high-sensitivity troponin I  flat pattern in the 2000s per cardio is most likely associated with decompensated heart failure and in the setting of renal insufficiency.  Acute on chronic renal failure with CKD stage IIIbat baseline.  Creatinine 2.21  --> 2.4. --> 2.74  Potassium high normal --> WNL  Sodium 133 --> 138 and remains WNL  Cozaar has been held for now.  Trend BMP daily  Anemia, mild iron deficiency, likely CKD component  Trend CBC daily for now  Other chronic cardiac issues: Status post TAVR, prosthetic function  normal by echocardiogram with mild perivalvular leak, Moderate to severe mitral regurgitation, CAD status post CABG with plan for medical therapy given poor revascularization options (occluded grafts).      DVT prophylaxis: heparin q12h Code Status: FULL Family Communication:  Disposition Plan: pending for now, hopefully home, possible w/ HH, PT/OT to evaluate    Consultants:   Cardiology   Palliative Care - cancelled, not needed I think after my discussion w/ son   Procedures:  Echo 03/29/18   Antimicrobials:  Anti-infectives (From admission, onward)   Start     Dose/Rate Route Frequency Ordered Stop   04/01/19 1000  cefTRIAXone (ROCEPHIN) 2 g in sodium chloride 0.9 % 100 mL IVPB     2 g 200 mL/hr over 30 Minutes Intravenous Daily 04/01/19 0817         Subjective: Patient reports no complaints but he is not really carrying ona  Conversation, seems confused but he is not combative or agitated  Objective: Vitals:   04/01/19 0136 04/01/19 0531 04/01/19 0943 04/01/19 1143  BP: (!) 98/51 99/61 (!) 101/56 (!) 99/53  Pulse: 62 77 60 62  Resp:  20  18  Temp:  97.6 F (36.4 C)  97.6 F (36.4 C)  TempSrc:  Oral  Oral  SpO2: 100% 98%  98%  Weight:  70.5 kg    Height:        Intake/Output Summary (Last 24 hours) at 04/01/2019 1245 Last data filed at 04/01/2019 0900 Gross per 24 hour  Intake 960 ml  Output 725 ml  Net 235 ml   Filed Weights   03/30/19 1801 03/31/19 0537 04/01/19 0531  Weight: 71.6 kg 71 kg 70.5 kg    Examination:  General exam: Appears calm and comfortable  Respiratory system: Clear to auscultation. Respiratory effort normal. Cardiovascular system: S1 & S2 heard, irreg/irreg. No JVD, +syseolic mitral murmurs, NO rubs, gallops or clicks. Trace pedal edema. Gastrointestinal system: Abdomen is nondistended, soft and nontender. No organomegaly or masses felt. Normal bowel sounds heard. Central nervous system: Alert. No focal neurological  deficits. Extremities: Symmetric 5 x 5 power. Skin: No rashes, lesions or ulcers Psychiatry: Judgement and insight unable to ascertain Mood & affect appropriate.     Data Reviewed: I have personally reviewed following labs and imaging studies  CBC: Recent Labs  Lab 03/30/19 1046 04/01/19 0646  WBC 9.9 8.8  NEUTROABS 5.4  --   HGB 8.6* 8.0*  HCT 29.2* 26.9*  MCV 91.5 90.0  PLT 181 162   Basic Metabolic Panel: Recent Labs  Lab 03/30/19 1046 03/31/19 0813 04/01/19 0646  NA 133* 138 137  K 5.1 4.5 4.7  CL 99 102 100  CO2 22 22 24   GLUCOSE 139* 79 99  BUN 78* 86* 96*  CREATININE 2.21* 2.45* 2.74*  CALCIUM 8.6* 8.6* 8.3*   GFR: Estimated Creatinine Clearance: 17.1 mL/min (A) (by C-G formula based on SCr of 2.74 mg/dL (H)). Liver Function Tests: Recent Labs  Lab 03/30/19 1046  AST 161*  ALT 105*  ALKPHOS 107  BILITOT 0.9  PROT 6.7  ALBUMIN 3.4*   Recent Labs  Lab 03/30/19 1046  LIPASE 36   No results for input(s): AMMONIA in the last 168 hours. Coagulation Profile: No results for input(s): INR, PROTIME in the last 168 hours. Cardiac Enzymes: No results for input(s): CKTOTAL, CKMB, CKMBINDEX, TROPONINI in the last 168 hours. BNP (last 3 results) No results for input(s): PROBNP in the last 8760 hours. HbA1C: No results for input(s): HGBA1C in the last 72 hours. CBG: No results for input(s): GLUCAP in the last 168 hours. Lipid Profile: No results for input(s): CHOL, HDL, LDLCALC, TRIG, CHOLHDL, LDLDIRECT in the last 72 hours. Thyroid Function Tests: No results for input(s): TSH, T4TOTAL, FREET4, T3FREE, THYROIDAB in the last 72 hours. Anemia Panel: Recent Labs    03/30/19 1350 03/30/19 1409  FERRITIN 98  --   TIBC 361  --   IRON 22*  --   RETICCTPCT  --  3.2*   Urine analysis:    Component Value Date/Time   COLORURINE YELLOW 03/30/2019 2138   APPEARANCEUR HAZY (A) 03/30/2019 2138   LABSPEC 1.010 03/30/2019 2138   PHURINE 5.0 03/30/2019 2138    GLUCOSEU NEGATIVE 03/30/2019 2138   HGBUR SMALL (A) 03/30/2019 2138   BILIRUBINUR NEGATIVE 03/30/2019 2138   KETONESUR NEGATIVE 03/30/2019 2138   PROTEINUR NEGATIVE 03/30/2019 2138   UROBILINOGEN 4.0 (H) 11/14/2014 1849   NITRITE NEGATIVE 03/30/2019 2138   LEUKOCYTESUR MODERATE (A) 03/30/2019 2138   Sepsis Labs: @LABRCNTIP (procalcitonin:4,lacticidven:4)  Recent Results (from the past 240 hour(s))  Respiratory Panel by RT PCR (Flu A&B, Covid) - Nasopharyngeal Swab     Status: None   Collection Time: 03/30/19 11:23 AM   Specimen: Nasopharyngeal Swab  Result  Value Ref Range Status   SARS Coronavirus 2 by RT PCR NEGATIVE NEGATIVE Final    Comment: (NOTE) SARS-CoV-2 target nucleic acids are NOT DETECTED. The SARS-CoV-2 RNA is generally detectable in upper respiratoy specimens during the acute phase of infection. The lowest concentration of SARS-CoV-2 viral copies this assay can detect is 131 copies/mL. A negative result does not preclude SARS-Cov-2 infection and should not be used as the sole basis for treatment or other patient management decisions. A negative result may occur with  improper specimen collection/handling, submission of specimen other than nasopharyngeal swab, presence of viral mutation(s) within the areas targeted by this assay, and inadequate number of viral copies (<131 copies/mL). A negative result must be combined with clinical observations, patient history, and epidemiological information. The expected result is Negative. Fact Sheet for Patients:  https://www.moore.com/ Fact Sheet for Healthcare Providers:  https://www.young.biz/ This test is not yet ap proved or cleared by the [place] FDA and  has been authorized for detection and/or diagnosis of SARS-CoV-2 by FDA under an Emergency Use Authorization (EUA). This EUA will remain  in effect (meaning this test can be used) for the duration of the COVID-19  declaration under Section 564(b)(1) of the Act, 21 U.S.C. section 360bbb-3(b)(1), unless the authorization is terminated or revoked sooner.    Influenza A by PCR NEGATIVE NEGATIVE Final   Influenza B by PCR NEGATIVE NEGATIVE Final    Comment: (NOTE) The Xpert Xpress SARS-CoV-2/FLU/RSV assay is intended as an aid in  the diagnosis of influenza from Nasopharyngeal swab specimens and  should not be used as a sole basis for treatment. Nasal washings and  aspirates are unacceptable for Xpert Xpress SARS-CoV-2/FLU/RSV  testing. Fact Sheet for Patients: https://www.moore.com/ Fact Sheet for Healthcare Providers: https://www.young.biz/ This test is not yet approved or cleared by the [place] FDA and  has been authorized for detection and/or diagnosis of SARS-CoV-2 by  FDA under an Emergency Use Authorization (EUA). This EUA will remain  in effect (meaning this test can be used) for the duration of the  Covid-19 declaration under Section 564(b)(1) of the Act, 21  U.S.C. section 360bbb-3(b)(1), unless the authorization is  terminated or revoked. Performed at [facility], [address], [place], [place] [address]   Urine culture     Status: Abnormal (Preliminary result)   Collection Time: 03/30/19  9:38 PM   Specimen: Urine, Random  Result Value Ref Range Status   Specimen Description URINE, RANDOM  Final   Special Requests NONE  Final   Culture (A)  Final    >=100,000 COLONIES/mL ESCHERICHIA COLI SUSCEPTIBILITIES TO FOLLOW Performed at [facility], [address] [place], [place] [address]    Report Status PENDING  Incomplete         Radiology Studies last 96 hours: DG Chest Port 1 View  Result Date: 03/31/2019 CLINICAL DATA:  Dyspnea on exertion. EXAM: PORTABLE CHEST 1 VIEW COMPARISON:  March 30, 2019. FINDINGS: Stable cardiomegaly. Status post coronary artery bypass graft. Status post transcatheter aortic valve repair.  Atherosclerosis of thoracic aorta is noted. No pneumothorax is noted. Mild bibasilar subsegmental atelectasis or edema or infiltrates are noted. No significant pleural effusion is noted. Bony thorax is unremarkable. IMPRESSION: Aortic atherosclerosis. Stable cardiomegaly. Mild bibasilar subsegmental atelectasis or infiltrates are noted concerning for edema or infiltrates. Electronically Signed   By: Lupita Raider M.D.   On: 03/31/2019 09:21   DG Chest Portable 1 View  Result Date: 03/30/2019 CLINICAL DATA:  Cp and sob, low o2. Best obtainable due to pt condition. EXAM: PORTABLE CHEST 1 VIEW COMPARISON:  Chest radiograph 02/18/2019 FINDINGS: Stable cardiomediastinal contours status post median sternotomy and CABG. There are new mild diffuse hazy interstitial opacities which could represent trace edema or atypical infection. Chronic faint reticular opacities at the lung bases could represent a mild fibrosis. Possible small right pleural effusion. IMPRESSION: Very mild diffuse interstitial opacities could represent trace pulmonary edema or atypical infection. Possible tiny right pleural effusion. Chronic changes at the bilateral lung bases. Electronically Signed   By: Emmaline Kluver M.D.   On: 03/30/2019 10:43   ECHOCARDIOGRAM LIMITED  Result Date: 03/30/2019   ECHOCARDIOGRAM LIMITED REPORT   Patient Name:   TAVEN STRITE Date of Exam: 03/30/2019 Medical Rec #:  [id number]      Height:       67.0 in Accession #:    [id number]     Weight:       150.8 lb Date of Birth:  11-08-1927      BSA:          1.79 m Patient Age:    89 years       BP:           108/62 mmHg Patient Gender: M              HR:           65 bpm. Exam Location:  Inpatient  Procedure: Limited Color Doppler, Cardiac Doppler and Limited Echo Indications:    acute systolic chf 428.21  History:        Patient has prior history of Echocardiogram examinations, most                 recent 02/19/2019. Aortic Valve: A 64mm Edwards Edwards Sapien                  bioprosthetic, stented aortic valve (TAVR)  Sonographer:    Delcie Roch Referring Phys: [id number]  THOMAS O'NEAL  Sonographer Comments: Image acquisition challenging due to uncooperative patient. IMPRESSIONS  1. Left ventricular ejection fraction, by visual estimation, is 25 to 30%. The left ventricle has severely decreased function. There is no increased left ventricular wall thickness.  2. Definity contrast agent was given IV to delineate the left ventricular endocardial borders.  3. Elevated left atrial pressure.  4. Left ventricular diastolic parameters are consistent with Grade III diastolic dysfunction (restrictive).  5. The left ventricle demonstrates global hypokinesis.  6. No LV thrombus.  7. Global right ventricle has moderately reduced systolic function.The right ventricular size is moderately enlarged. no increase in right ventricular wall thickness.  8. Left atrial size was severely dilated.  9. Presence of pericardial fat pad. 10. Mild mitral annular calcification. 11. The mitral valve is degenerative. Moderate to severe mitral valve regurgitation. 12. The tricuspid valve was grossly normal. Tricuspid valve regurgitation is mild. 13. Tricuspid valve regurgitation is mild. 14. 29 mm Sapien 3. Normal gradients. Mild paravalvular leak. 15. TR signal is inadequate for assessing pulmonary artery systolic pressure. 16. The inferior vena cava is dilated in size with <50% respiratory variability, suggesting right atrial pressure of 15 mmHg. 17. A prior study was performed on 02/19/2019. 18. EF remains largely unchanged from prior. 19. No significant change from prior study. 20. The interatrial septum was not assessed. FINDINGS  Left Ventricle: Left ventricular ejection fraction, by visual estimation, is 25 to 30%. The left ventricle has severely decreased function. Definity contrast agent  was given IV to delineate the left ventricular endocardial borders. The left ventricle demonstrates global  hypokinesis. The left ventricular internal cavity size was the left ventricle is normal in size. There is no increased left ventricular wall thickness. Left ventricular diastolic parameters are consistent with Grade III diastolic dysfunction (restrictive). Elevated left atrial pressure. No LV thrombus. Right Ventricle: The right ventricular size is moderately enlarged. No increase in right ventricular wall thickness. Global RV systolic function is has moderately reduced systolic function. Left Atrium: Left atrial size was severely dilated. Right Atrium: Right atrial size was mild-moderately dilated. Right atrial pressure is estimated at 15 mmHg. Pericardium: There is no evidence of pericardial effusion is seen. There is no evidence of pericardial effusion. Presence of pericardial fat pad. Mitral Valve: The mitral valve is degenerative in appearance. Mild mitral annular calcification. MV Area by PHT, 4.39 cm. MV PHT, 50.17 msec. Moderate to severe mitral valve regurgitation. Tricuspid Valve: The tricuspid valve is grossly normal. Tricuspid valve regurgitation is mild. Aortic Valve: The aortic valve has been repaired/replaced. Aortic valve regurgitation is mild. Aortic valve mean gradient measures 9.0 mmHg. Aortic valve peak gradient measures 15.4 mmHg. Aortic valve area, by VTI measures 1.92 cm. 83mm Edwards Edwards Sapien bioprosthetic, stented aortic valve (TAVR) valve is present in the aortic position. Echo findings show normal structure and function of the aortic prosthesis. 29 mm Sapien 3. Normal gradients. Mild paravalvular leak. Pulmonic Valve: The pulmonic valve was grossly normal. Pulmonic valve regurgitation is mild by color flow Doppler. Pulmonic regurgitation is mild by color flow Doppler. Aorta: The aortic root is normal in size and structure. Venous: The inferior vena cava is dilated in size with less than 50% respiratory variability, suggesting right atrial pressure of 15 mmHg. Shunts: The  interatrial septum was not assessed. Additional Comments: A prior study was performed on 02/19/2019.  LEFT VENTRICLE          Normals PLAX 2D LVIDd:         5.20 cm  3.6 cm   Diastology                 Normals LVIDs:         4.30 cm  1.7 cm   LV e' lateral:   6.74 cm/s 6.42 cm/s LV PW:         1.30 cm  1.4 cm   LV E/e' lateral: 19.1      15.4 LV IVS:        1.10 cm  1.3 cm   LV e' medial:    4.24 cm/s 6.96 cm/s LVOT diam:     2.00 cm  2.0 cm   LV E/e' medial:  30.4      6.96 LV SV:         46 ml    79 ml LV SV Index:   25.74    45 ml/m2 LVOT Area:     3.14 cm 3.14 cm2  LEFT ATRIUM           Index LA diam:      5.10 cm 2.84 cm/m LA Vol (A4C): 67.1 ml 37.41 ml/m  AORTIC VALVE                    Normals AV Area (Vmax):    1.91 cm AV Area (Vmean):   1.77 cm     3.06 cm2 AV Area (VTI):     1.92 cm AV Vmax:  196.50 cm/s AV Vmean:          138.000 cm/s 77 cm/s AV VTI:            0.376 m      3.15 cm2 AV Peak Grad:      15.4 mmHg AV Mean Grad:      9.0 mmHg     3 mmHg LVOT Vmax:         119.50 cm/s LVOT Vmean:        77.900 cm/s  75 cm/s LVOT VTI:          0.230 m      25.3 cm LVOT/AV VTI ratio: 0.61         1  AORTA                 Normals Ao Root diam: 2.80 cm 31 mm MITRAL VALVE              Normals MV Area (PHT): 4.39 cm              SHUNTS MV PHT:        50.17 msec 55 ms      Systemic VTI:  0.23 m MV Decel Time: 173 msec   187 ms     Systemic Diam: 2.00 cm MV E velocity: 129.00 cm/s 103 cm/s MV A velocity: 65.30 cm/s  70.3 cm/s MV E/A ratio:  1.98        1.5  Lennie Odor MD Electronically signed by Lennie Odor MD Signature Date/Time: 03/30/2019/6:43:27 PMThe mitral valve is degenerative in appearance.    Final          Scheduled Meds: . acetaminophen  1,000 mg Oral BID  . aspirin EC  81 mg Oral Daily  . atorvastatin  20 mg Oral Daily  . clopidogrel  75 mg Oral Daily  . feeding supplement (ENSURE ENLIVE)  237 mL Oral BID BM  . folic acid  3 mg Oral Daily  . furosemide  40 mg Oral BID  .  heparin  5,000 Units Subcutaneous Q12H  . levothyroxine  100 mcg Oral QAC breakfast  . multivitamin with minerals  1 tablet Oral Daily  . predniSONE  5 mg Oral Daily  . sodium chloride flush  3 mL Intravenous Q12H  . vitamin B-12  1,000 mcg Oral Daily   Continuous Infusions: . sodium chloride 250 mL (04/01/19 1017)  . cefTRIAXone (ROCEPHIN)  IV 2 g (04/01/19 1020)     LOS: 2 days    Time spent: 35 mins       Sunnie Nielsen DO [facility]  Pager [phone]  or secure message in Epic

## 2019-04-01 NOTE — Progress Notes (Signed)
Initial Nutrition Assessment  DOCUMENTATION CODES:   Not applicable  INTERVENTION:   -MVI with minerals daily -Decrease Ensure Enlive po to BID, each supplement provides 350 kcal and 20 grams of protein  NUTRITION DIAGNOSIS:   Increased nutrient needs related to chronic illness(CHF) as evidenced by estimated needs.  GOAL:   Patient will meet greater than or equal to 90% of their needs  MONITOR:   PO intake, Supplement acceptance, Labs, Weight trends, Skin, I & O's  REASON FOR ASSESSMENT:   Low Braden    ASSESSMENT:   Hunter Choi is a 86 y.o. male with medical history significant of CAD, s/p TARV, CHF, CVA, hypothyroidism who presents to ER for worsening dyspnea for one week. Patient is accompanied by his son, Hunter Choi, who is his full time caretaker.  Patient is hard-hearing and thus most of the history provided by patient's son.  The son noted over the past 7 days pt has ad several lbs weight gain too. Son increased home Lasix 40 mg qd to 40 mg BID for 3 days without significant improvement. Has noted increased edema to LE bilaterally. Patient aslo had decreased appetite recently and some generalized weakness.  Denies any chest pain no fever chills no cough, no nauseous vomiting no diarrhea.  No abdominal pain.  Pt admitted with acute on chronic CHF.   Reviewed I/O's: -5 ml x 24 hours and -680 ml since admission  UOP: 725 ml x 24 hours  Case discussed with RN, who reports that pt's mentation has improved since yesterday. He has been taking foods, liquids, and medications without difficulty. Per RN, pt really enjoys drinking Ensure supplements.   Spoke with pt, who mainly provided one word answers. He reports good appetite and was eating well at home (per RN, pt son prepares food for him). He reports he had breakfast, but does not remember what he ate. Noted meal completion 50-100%. Discussed with pt importance of good meal and supplement intake to promote healing.   Reviewed  wt hx; wt has been stable over the past year. No signs of edema noted on exam.   Per RN, pt with poor prognosis. Plan for palliative care consult to discuss goals of care.   Medications reviewed and include folvite, lasix, MVI, and prednisone.  Labs reviewed.   NUTRITION - FOCUSED PHYSICAL EXAM:    Most Recent Value  Orbital Region  No depletion  Upper Arm Region  No depletion  Thoracic and Lumbar Region  No depletion  Buccal Region  No depletion  Temple Region  No depletion  Clavicle Bone Region  No depletion  Clavicle and Acromion Bone Region  No depletion  Scapular Bone Region  No depletion  Dorsal Hand  Unable to assess  Patellar Region  Mild depletion  Anterior Thigh Region  Mild depletion  Posterior Calf Region  Mild depletion  Edema (RD Assessment)  None  Hair  Reviewed  Eyes  Reviewed  Mouth  Reviewed  Skin  Reviewed  Nails  Reviewed       Diet Order:   Diet Order            Diet Heart Room service appropriate? Yes; Fluid consistency: Thin; Fluid restriction: 1800 mL Fluid  Diet effective now              EDUCATION NEEDS:   Education needs have been addressed  Skin:  Skin Assessment: Reviewed RN Assessment  Last BM:  Unknown  Height:   Ht Readings from Last  1 Encounters:  03/30/19 5\' 7"  (1.702 m)    Weight:   Wt Readings from Last 1 Encounters:  04/01/19 70.5 kg    Ideal Body Weight:  67.3 kg  BMI:  Body mass index is 24.34 kg/m.  Estimated Nutritional Needs:   Kcal:  1750-1950  Protein:  85-100 grams  Fluid:  1.8 L    Shaelin Lalley A. 05/30/19, RD, LDN, CDCES Registered Dietitian II Certified Diabetes Care and Education Specialist Pager: [phone] After hours Pager: [phone]

## 2019-04-01 NOTE — Progress Notes (Signed)
Progress Note  Patient Name: Hunter Choi Date of Encounter: 04/01/2019  Primary Cardiologist: Lesleigh Noe, MD  Subjective   The patient seems confused, but comfortable.  Inpatient Medications    Scheduled Meds: . acetaminophen  1,000 mg Oral BID  . aspirin EC  81 mg Oral Daily  . atorvastatin  20 mg Oral Daily  . clopidogrel  75 mg Oral Daily  . feeding supplement (ENSURE ENLIVE)  237 mL Oral TID BM  . folic acid  3 mg Oral Daily  . furosemide  40 mg Intravenous Q12H  . heparin  5,000 Units Subcutaneous Q12H  . levothyroxine  100 mcg Oral QAC breakfast  . multivitamin with minerals  1 tablet Oral Daily  . predniSONE  5 mg Oral Daily  . sodium chloride flush  3 mL Intravenous Q12H  . vitamin B-12  1,000 mcg Oral Daily   Continuous Infusions: . sodium chloride    . cefTRIAXone (ROCEPHIN)  IV     PRN Meds: sodium chloride, acetaminophen, calcium carbonate, fluticasone, guaiFENesin-dextromethorphan, nitroGLYCERIN, ondansetron (ZOFRAN) IV, sodium chloride flush   Vital Signs    Vitals:   04/01/19 0034 04/01/19 0136 04/01/19 0531 04/01/19 0943  BP: (!) 95/52 (!) 98/51 99/61 (!) 101/56  Pulse: 64 62 77 60  Resp: 18  20   Temp: 97.6 F (36.4 C)  97.6 F (36.4 C)   TempSrc: Oral  Oral   SpO2: 96% 100% 98%   Weight:   70.5 kg   Height:        Intake/Output Summary (Last 24 hours) at 04/01/2019 0958 Last data filed at 04/01/2019 0900 Gross per 24 hour  Intake 960 ml  Output 725 ml  Net 235 ml   Filed Weights   03/30/19 1801 03/31/19 0537 04/01/19 0531  Weight: 71.6 kg 71 kg 70.5 kg    Telemetry    Sinus rhythm.  Personally reviewed.  ECG    An ECG dated 03/30/2019 was personally reviewed today and demonstrated:  Sinus rhythm with prolonged PR interval and left bundle branch block.  Physical Exam   GEN:  Elderly male in no obvious distress.   Neck:  Elevated JVP. Cardiac: RRR, no murmur or gallop.  Respiratory:  Crackles at the bases, no  wheezing. GI: Nontender, bowel sounds present. MS: No edema; No deformity. Neuro:  Nonfocal. Psych: Alert and oriented x 1.  Labs    Chemistry Recent Labs  Lab 03/30/19 1046 03/31/19 0813 04/01/19 0646  NA 133* 138 137  K 5.1 4.5 4.7  CL 99 102 100  CO2 22 22 24   GLUCOSE 139* 79 99  BUN 78* 86* 96*  CREATININE 2.21* 2.45* 2.74*  CALCIUM 8.6* 8.6* 8.3*  PROT 6.7  --   --   ALBUMIN 3.4*  --   --   AST 161*  --   --   ALT 105*  --   --   ALKPHOS 107  --   --   BILITOT 0.9  --   --   GFRNONAA 25* 22* 20*  GFRAA 30* 26* 23*  ANIONGAP 12 14 13      Hematology Recent Labs  Lab 03/30/19 1046 03/30/19 1409 04/01/19 0646  WBC 9.9  --  8.8  RBC 3.19* 3.18* 2.99*  HGB 8.6*  --  8.0*  HCT 29.2*  --  26.9*  MCV 91.5  --  90.0  MCH 27.0  --  26.8  MCHC 29.5*  --  29.7*  RDW  18.2*  --  18.0*  PLT 181  --  162    Cardiac Enzymes Recent Labs  Lab 03/30/19 1046 03/30/19 1225 03/30/19 2028  TROPONINIHS 2,898* 2,687* 2,228*    BNP Recent Labs  Lab 03/30/19 1046  BNP >4,500.0*     DDimer Recent Labs  Lab 03/30/19 1350  DDIMER 17.53*     Radiology    DG Chest Port 1 View  Result Date: 03/31/2019 CLINICAL DATA:  Dyspnea on exertion. EXAM: PORTABLE CHEST 1 VIEW COMPARISON:  March 30, 2019. FINDINGS: Stable cardiomegaly. Status post coronary artery bypass graft. Status post transcatheter aortic valve repair. Atherosclerosis of thoracic aorta is noted. No pneumothorax is noted. Mild bibasilar subsegmental atelectasis or edema or infiltrates are noted. No significant pleural effusion is noted. Bony thorax is unremarkable. IMPRESSION: Aortic atherosclerosis. Stable cardiomegaly. Mild bibasilar subsegmental atelectasis or infiltrates are noted concerning for edema or infiltrates. Electronically Signed   By: Lupita Raider M.D.   On: 03/31/2019 09:21   DG Chest Portable 1 View  Result Date: 03/30/2019 CLINICAL DATA:  Cp and sob, low o2. Best obtainable due to pt  condition. EXAM: PORTABLE CHEST 1 VIEW COMPARISON:  Chest radiograph 02/18/2019 FINDINGS: Stable cardiomediastinal contours status post median sternotomy and CABG. There are new mild diffuse hazy interstitial opacities which could represent trace edema or atypical infection. Chronic faint reticular opacities at the lung bases could represent a mild fibrosis. Possible small right pleural effusion. IMPRESSION: Very mild diffuse interstitial opacities could represent trace pulmonary edema or atypical infection. Possible tiny right pleural effusion. Chronic changes at the bilateral lung bases. Electronically Signed   By: Emmaline Kluver M.D.   On: 03/30/2019 10:43   ECHOCARDIOGRAM LIMITED  Result Date: 03/30/2019   ECHOCARDIOGRAM LIMITED REPORT   Patient Name:   Hunter Choi Date of Exam: 03/30/2019 Medical Rec #:  [id number]      Height:       67.0 in Accession #:    [id number]     Weight:       150.8 lb Date of Birth:  [birth year]      BSA:          1.79 m Patient Age:    86 years       BP:           108/62 mmHg Patient Gender: M              HR:           65 bpm. Exam Location:  Inpatient  Procedure: Limited Color Doppler, Cardiac Doppler and Limited Echo Indications:    acute systolic chf 428.21  History:        Patient has prior history of Echocardiogram examinations, most                 recent 02/19/2019. Aortic Valve: A 3mm Edwards Edwards Sapien                 bioprosthetic, stented aortic valve (TAVR)  Sonographer:    Delcie Roch Referring Phys: [id number]  THOMAS O'NEAL  Sonographer Comments: Image acquisition challenging due to uncooperative patient. IMPRESSIONS  1. Left ventricular ejection fraction, by visual estimation, is 25 to 30%. The left ventricle has severely decreased function. There is no increased left ventricular wall thickness.  2. Definity contrast agent was given IV to delineate the left ventricular endocardial borders.  3. Elevated left atrial pressure.  4. Left ventricular  diastolic parameters are consistent with Grade  III diastolic dysfunction (restrictive).  5. The left ventricle demonstrates global hypokinesis.  6. No LV thrombus.  7. Global right ventricle has moderately reduced systolic function.The right ventricular size is moderately enlarged. no increase in right ventricular wall thickness.  8. Left atrial size was severely dilated.  9. Presence of pericardial fat pad. 10. Mild mitral annular calcification. 11. The mitral valve is degenerative. Moderate to severe mitral valve regurgitation. 12. The tricuspid valve was grossly normal. Tricuspid valve regurgitation is mild. 13. Tricuspid valve regurgitation is mild. 14. 29 mm Sapien 3. Normal gradients. Mild paravalvular leak. 15. TR signal is inadequate for assessing pulmonary artery systolic pressure. 16. The inferior vena cava is dilated in size with <50% respiratory variability, suggesting right atrial pressure of 15 mmHg. 17. A prior study was performed on 02/19/2019. 18. EF remains largely unchanged from prior. 19. No significant change from prior study. 20. The interatrial septum was not assessed. FINDINGS  Left Ventricle: Left ventricular ejection fraction, by visual estimation, is 25 to 30%. The left ventricle has severely decreased function. Definity contrast agent was given IV to delineate the left ventricular endocardial borders. The left ventricle demonstrates global hypokinesis. The left ventricular internal cavity size was the left ventricle is normal in size. There is no increased left ventricular wall thickness. Left ventricular diastolic parameters are consistent with Grade III diastolic dysfunction (restrictive). Elevated left atrial pressure. No LV thrombus. Right Ventricle: The right ventricular size is moderately enlarged. No increase in right ventricular wall thickness. Global RV systolic function is has moderately reduced systolic function. Left Atrium: Left atrial size was severely dilated. Right Atrium:  Right atrial size was mild-moderately dilated. Right atrial pressure is estimated at 15 mmHg. Pericardium: There is no evidence of pericardial effusion is seen. There is no evidence of pericardial effusion. Presence of pericardial fat pad. Mitral Valve: The mitral valve is degenerative in appearance. Mild mitral annular calcification. MV Area by PHT, 4.39 cm. MV PHT, 50.17 msec. Moderate to severe mitral valve regurgitation. Tricuspid Valve: The tricuspid valve is grossly normal. Tricuspid valve regurgitation is mild. Aortic Valve: The aortic valve has been repaired/replaced. Aortic valve regurgitation is mild. Aortic valve mean gradient measures 9.0 mmHg. Aortic valve peak gradient measures 15.4 mmHg. Aortic valve area, by VTI measures 1.92 cm. 69mm Edwards Edwards Sapien bioprosthetic, stented aortic valve (TAVR) valve is present in the aortic position. Echo findings show normal structure and function of the aortic prosthesis. 29 mm Sapien 3. Normal gradients. Mild paravalvular leak. Pulmonic Valve: The pulmonic valve was grossly normal. Pulmonic valve regurgitation is mild by color flow Doppler. Pulmonic regurgitation is mild by color flow Doppler. Aorta: The aortic root is normal in size and structure. Venous: The inferior vena cava is dilated in size with less than 50% respiratory variability, suggesting right atrial pressure of 15 mmHg. Shunts: The interatrial septum was not assessed. Additional Comments: A prior study was performed on 02/19/2019.  LEFT VENTRICLE          Normals PLAX 2D LVIDd:         5.20 cm  3.6 cm   Diastology                 Normals LVIDs:         4.30 cm  1.7 cm   LV e' lateral:   6.74 cm/s 6.42 cm/s LV PW:         1.30 cm  1.4 cm   LV E/e' lateral: 19.1  15.4 LV IVS:        1.10 cm  1.3 cm   LV e' medial:    4.24 cm/s 6.96 cm/s LVOT diam:     2.00 cm  2.0 cm   LV E/e' medial:  30.4      6.96 LV SV:         46 ml    79 ml LV SV Index:   25.74    45 ml/m2 LVOT Area:     3.14 cm  3.14 cm2  LEFT ATRIUM           Index LA diam:      5.10 cm 2.84 cm/m LA Vol (A4C): 67.1 ml 37.41 ml/m  AORTIC VALVE                    Normals AV Area (Vmax):    1.91 cm AV Area (Vmean):   1.77 cm     3.06 cm2 AV Area (VTI):     1.92 cm AV Vmax:           196.50 cm/s AV Vmean:          138.000 cm/s 77 cm/s AV VTI:            0.376 m      3.15 cm2 AV Peak Grad:      15.4 mmHg AV Mean Grad:      9.0 mmHg     3 mmHg LVOT Vmax:         119.50 cm/s LVOT Vmean:        77.900 cm/s  75 cm/s LVOT VTI:          0.230 m      25.3 cm LVOT/AV VTI ratio: 0.61         1  AORTA                 Normals Ao Root diam: 2.80 cm 31 mm MITRAL VALVE              Normals MV Area (PHT): 4.39 cm              SHUNTS MV PHT:        50.17 msec 55 ms      Systemic VTI:  0.23 m MV Decel Time: 173 msec   187 ms     Systemic Diam: 2.00 cm MV E velocity: 129.00 cm/s 103 cm/s MV A velocity: 65.30 cm/s  70.3 cm/s MV E/A ratio:  1.98        1.5  Lennie Odor MD Electronically signed by Lennie Odor MD Signature Date/Time: 03/30/2019/6:43:27 PMThe mitral valve is degenerative in appearance.    Final     Patient Profile     86 y.o. male with a history of CAD status post CABG, ischemic cardiomyopathy, TAVR, rheumatoid arthritis, chronic kidney disease, and hypertension now presenting with acute on chronic systolic heart failure and volume overload.  Assessment & Plan    1.  Acute on chronic systolic heart failure.  Echocardiogram reveals LVEF 25 to 30% with grade 3 diastolic dysfunction and global hypokinesis.  Also has moderate RV dysfunction.  Diuresing on high-dose IV Lasix but output incomplete.  He is hypotensive, I would switch to lasix 40 mg PO BID.  2.  Status post TAVR, prosthetic function normal by echocardiogram with mild perivalvular leak.  3.  Moderate to severe mitral regurgitation.  4.  Acute on chronic renal failure with CKD stage IIIb  at baseline.  Creatinine 2.21.  Potassium high normal.  Cozaar has been discontinued  for now.  5.  Elevated high-sensitivity troponin I, flat pattern in the 2000s most likely associated with decompensated heart failure and in the setting of renal insufficiency.  6.  CAD status post CABG with plan for medical therapy given poor revascularization options (occluded grafts).  Continues on aspirin, and statin.  Overall prognosis is poor, I would agree with considering palliative care consultation to define reasonable goals of care.   Continue PO lasix, aspirin, Plavix, and Lipitor.  [facility] will sign off.   Medication Recommendations:  As above Other recommendations (labs, testing, etc):  No further testing, consider palliative care Follow up as an outpatient:  As needed.   Signed, Tobias Alexander, MD  04/01/2019, 9:58 AM

## 2019-04-02 ENCOUNTER — Other Ambulatory Visit: Payer: Self-pay

## 2019-04-02 LAB — BASIC METABOLIC PANEL
Anion gap: 12 (ref 5–15)
BUN: 96 mg/dL — ABNORMAL HIGH (ref 8–23)
CO2: 26 mmol/L (ref 22–32)
Calcium: 8.6 mg/dL — ABNORMAL LOW (ref 8.9–10.3)
Chloride: 101 mmol/L (ref 98–111)
Creatinine, Ser: 2.35 mg/dL — ABNORMAL HIGH (ref 0.61–1.24)
GFR calc Af Amer: 27 mL/min — ABNORMAL LOW (ref >60)
GFR calc non Af Amer: 24 mL/min — ABNORMAL LOW (ref >60)
Glucose, Bld: 117 mg/dL — ABNORMAL HIGH (ref 70–99)
Potassium: 4.3 mmol/L (ref 3.5–5.1)
Sodium: 139 mmol/L (ref 135–145)

## 2019-04-02 LAB — URINE CULTURE: Culture: >=100000 — AB

## 2019-04-02 MED ORDER — CEPHALEXIN 250 MG PO CAPS: 500.0000 mg | ORAL_CAPSULE | Freq: Every day | ORAL | Status: DC

## 2019-04-02 MED ORDER — CEPHALEXIN 250 MG PO CAPS
500.0000 mg | ORAL_CAPSULE | Freq: Two times a day (BID) | ORAL | Status: DC
  Administered 2019-04-03 – 2019-04-08 (×9): 500 mg via ORAL
  Filled 2019-04-02 (×10): qty 2

## 2019-04-02 MED ORDER — FUROSEMIDE 80 MG PO TABS
80.0000 mg | ORAL_TABLET | Freq: Two times a day (BID) | ORAL | Status: DC
  Administered 2019-04-02 – 2019-04-06 (×8): 80 mg via ORAL
  Filled 2019-04-02 (×8): qty 1

## 2019-04-02 NOTE — Evaluation (Signed)
Occupational Therapy Evaluation Patient Details Name: Hunter Choi MRN: [id number] DOB: [birth year] Today's Date: 04/02/2019    History of Present Illness Pt is an 86 y.o. male admitted 03/30/19 with SOB, weight gain and AMS progressively over 2 weeks. (+) Ecoli. CXR with volume overload; worked up for acute on chronic CHF. PMH includes CAD s/p CABG, CHF, aortic stenosis s/p aortic valve replacement, AFIB, AAA, RA on chronic methotrexate and prednisone, CVA with L eye deficits.   Clinical Impression   This 86 yo male admitted with above presents to acute OT with decreased mobility, decreased balance, generalized weakness with A from son(s) for basic ADLs and tranfers prn. Pt is deconditioned from his current sickness and thus requiring increased A for all mobility related to basic ADLs. He will benefit from acute OT with follow up HHOT.    Follow Up Recommendations  Home health OT;Supervision/Assistance - 24 hour    Equipment Recommendations  None recommended by OT       Precautions / Restrictions Precautions Precautions: Fall Precaution Comments: Very HOH Restrictions Weight Bearing Restrictions: No      Mobility Bed Mobility Overal bed mobility: Needs Assistance Bed Mobility: Supine to Sit     Supine to sit: Min assist;HOB elevated     General bed mobility comments: MinA for trunk elevation, pt initiating scooting with BUE support to EOB well  Transfers Overall transfer level: Needs assistance Equipment used: 1 person hand held assist Transfers: Sit to/from [employer] Sit to Stand: Mod assist;+2 safety/equipment   Squat pivot transfers: Mod assist;+2 safety/equipment     General transfer comment: Pt with forward flexed posture and difficulty achieving fully upright, reaching UE to recliner rail for support to pivot; modA (+2 safety) for squat pivot from bed to recliner. Pt unable to take complete steps, assist to block L foot sliding    Balance  Overall balance assessment: Needs assistance   Sitting balance-Leahy Scale: Fair       Standing balance-Leahy Scale: Poor                             ADL either performed or assessed with clinical judgement   ADL Overall ADL's : Needs assistance/impaired Eating/Feeding: Moderate assistance;Sitting   Grooming: Designer, jewellery Details (indicate cue type and reason): Min A sitting EOB Upper Body Bathing: Maximal assistance;Sitting   Lower Body Bathing: Total assistance Lower Body Bathing Details (indicate cue type and reason): Mod A+2 sit<>stand for safety Upper Body Dressing : Maximal assistance;Sitting   Lower Body Dressing: Total assistance Lower Body Dressing Details (indicate cue type and reason): Mod A +2 sit<>stand from safety Toilet Transfer: Moderate assistance;+2 for safety/equipment Toilet Transfer Details (indicate cue type and reason): bed>recliner going to pt's right Toileting- Clothing Manipulation and Hygiene: Total assistance Toileting - Clothing Manipulation Details (indicate cue type and reason): Mod A +2 sit<>stand from safety             Vision Patient Visual Report: No change from baseline              Pertinent Vitals/Pain Pain Assessment: No/denies pain     Hand Dominance  right   Extremity/Trunk Assessment Upper Extremity Assessment Upper Extremity Assessment: RUE deficits/detail;LUE deficits/detail;Defer to OT evaluation(baseline hand/finger RA with deformity)   Lower Extremity Assessment Lower Extremity Assessment: RLE deficits/detail;LLE deficits/detail RLE Deficits / Details: Ankle DF <3/5, hip and knee functionally at least 3/5, some baseline ROM restrictions LLE Deficits /  Details: Ankle DF <3/5, hip and knee functionally at least 3/5, some baseline ROM restrictions       Communication Communication Communication: HOH   Cognition Arousal/Alertness: Awake/alert Behavior During Therapy: WFL for tasks  assessed/performed Overall Cognitive Status: Difficult to assess                                 General Comments: When he could hear them, following majority of verbal and gestural commands appropriately. Apparent cognitive deficits likely exacerbated by very HOH   General Comments  Difficulty getting SpO2 reading via finger/ear probe; finger pulse ox reading 97% on 2.5L O2, HR 67            Home Living Family/patient expects to be discharged to:: Private residence Living Arrangements: Children(sons) Available Help at Discharge: Family;Available 24 hours/day Type of Home: House Home Access: Level entry     Home Layout: One level     Bathroom Shower/Tub: Tub/shower unit;Walk-in shower   Bathroom Toilet: Standard Bathroom Accessibility: Yes   Home Equipment: Walker - 2 wheels;Wheelchair - manual;Bedside commode   Additional Comments: Home info from recent previous admission      Prior Functioning/Environment Level of Independence: Needs assistance  Gait / Transfers Assistance Needed: Per chart, assist for transfer to/from wheelchair ADL's / Homemaking Assistance Needed: Assist with ADLs            OT Problem List: Decreased strength;Impaired balance (sitting and/or standing)      OT Treatment/Interventions: Self-care/ADL training;DME and/or AE instruction;Patient/family education;Balance training    OT Goals(Current goals can be found in the care plan section) Acute Rehab OT Goals Patient Stated Goal: Agreeable to OOB OT Goal Formulation: With patient Time For Goal Achievement: 04/16/19 Potential to Achieve Goals: Good  OT Frequency: Min 2X/week           Co-evaluation   Reason for Co-Treatment: For patient/therapist safety;To address functional/ADL transfers PT goals addressed during session: Mobility/safety with mobility        AM-PAC OT "6 Clicks" Daily Activity     Outcome Measure Help from another person eating meals?: A Lot Help  from another person taking care of personal grooming?: A Lot Help from another person toileting, which includes using toliet, bedpan, or urinal?: Total Help from another person bathing (including washing, rinsing, drying)?: A Lot Help from another person to put on and taking off regular upper body clothing?: A Lot Help from another person to put on and taking off regular lower body clothing?: Total 6 Click Score: 10   End of Session Equipment Utilized During Treatment: Gait belt Nurse Communication: Mobility status  Activity Tolerance: Patient tolerated treatment well Patient left: in chair;with call bell/phone within reach;with chair alarm set  OT Visit Diagnosis: Unsteadiness on feet (R26.81);Other abnormalities of gait and mobility (R26.89);Muscle weakness (generalized) (M62.81)                Time: 5188-4166 OT Time Calculation (min): 29 min Charges:  OT General Charges $OT Visit: 1 Visit OT Evaluation $OT Eval Moderate Complexity: 1 Mod  Cathy , OTR/L Acute [employer] Pager [phone] Office [phone]     04/02/2019, 12:10 PM

## 2019-04-02 NOTE — Progress Notes (Signed)
PROGRESS NOTE    Hunter Choi  HYW:[id number] DOB: 15-Nov-1927 DOA: 03/30/2019 PCP: Hunter Ishihara, MD   Narrative:   Date of Admission:03/30/2019  LOS: Day 68   86 year old male with history of CAD/CABG, diastolic congestive heart failure, aortic stenosis s/p aortic valve replacement, paroxysmal atrial fib, AAA, rheumatoid arthritis on chronic methotrexate and prednisone, CVA with left eye deficits  Presented to ED 03/30/19 with complaints of shortness of breath and weight gain and altered mental status, ongoing about 2 weeks, failed to respond to outpatient escalation in diuretic therapy with Lasix, patient's son is primary caretaker.  ER course: volume overload versus pneumonia on chest x-ray, more likely volume overload.  Elevated troponins were trending down, EKG was not particularly concerning.  Cr reduced from baseline. cardiology was consulted, advised against heparin drip elevated troponin is most likely due to demand ischemia/decompensated heart failure and in the setting of renal insufficiency, okay to leave on aspirin and Plavix and treat for CHF exacerbation Lasix 80 mg IV twice daily.  Echocardiogram 03/30/19: EF 25 to 30%, severely decreased left ventricular function, grade 3 diastolic dysfunction, right ventricle moderately reduced systolic function and moderate enlargement, moderate to severe mitral valve regurg, dilated IVC ~NO significant change from prior study.   03/31/19: History difficult to obtain, patient is somnolent, he is arousable but not very communicative.    Output is inadequate, weight down by about 1 pound.    Cardiology reports that prognosis is poor, agree with palliative care consultation.  Continue IV Lasix 80 mg twice daily, hold Cozaar/Coreg, continue aspirin/Plavix/Lipitor.   I rechecked patient this afternoon 4:00 PM. He's about the same, a bit more awake but not really able to converse with me.   Attempted to contact patient's son but he was  not available  04/01/2019: Patient about the same from yesterday 03/31/2019, with regard to mental status.  He is easily arousable/alert but seems confused and is not very communicative.  Output still not impressive  Monitoring labs: Na better, Cr worse,   (+)transaminitis   UCx (+)Ecoli - previous culture showed (S)Cipro though this was 09/2015, will start Rocephin per pharmacy recs   This morning, I reduced Lasix dose to 40 mg IV after nurse alrted me to systolic BP borderline, if BP stays ok next couple hours I certainly see no problem giving another 40 and then back up to 80 bid  Visited again in PM, patient is much more alert, awake. He is HoH but can understand when we yell! Son present at bedside, all questions answered.   Today 04/02/19: Patient reports feeling well, again difficult to communicate given [facility]. I spoke to son, will visit dad today, I'd like his idea of patient's baseline / ability to care for him at home.  Yesterday we decreased Lasix from 80 bid to 40 bid, his BP is still pretty soft, but output is minimal and he still has rales, renal function still low though improved a bit from yesterday. I'll increase Lasix to 40 tid now.   Will repeat CXR   Changed to po abx for UTI  Ordered ambulate w/ O2 tomorrow in case he needs home O2  PT recommends home health. OT states no home OT needed.        Assessment & Plan:   Active Problems:   CHF (congestive heart failure) (HCC)  Acute on chronic systolic heart failure.   Cardiology following, appreciate their recommendations.  Echocardiogram reveals LVEF 25 to 30% with grade 3 diastolic dysfunction  and global hypokinesis. Also has moderate RV dysfunction.   Diuresing on high-dose IV Lasix 80 milligrams twice daily but output inadequate and no significant clinical improvement, helps for lower BP, I increased today   hold Cozaar/Coreg, continue aspirin/Plavix/Lipitor  Cardiology has signed off for  now  Discussion on goals of care Debility, likely end stage heart failure  Given poor prognosis, worth a frank discussion regarding goals of care.    I was able to speak with the patient's son, Hunter Choi, who has been his primary caregiver at home for years.  The patient lives at home with Hunter Choi and another son.  Family relations sound amicable.    I discussed with Hunter Choi that [name] is probably end-stage heart failure, I am concerned given that he remains full code as I think CPR/resuscitation efforts would probably be futile in his case.    With regard to issues of quality of life, Hunter Choi tells me that typically, [name] is alert, talkative, happy - but he tends to become confused when he is in the hospital.  This has happened a couple of times.   The family has had discussion with regard to goals of care in terms of resuscitation, [name] had to deal with some of this when his wife was ill and she is deceased.  Per [name]'s report, [name] would want all resuscitative attempts made.  He does not have an advanced directive in place, however.   I advised [name] that resuscitation measures, particularly CPR, can be quite painful/traumatic, and I would not be very comfortable performing CPR on [name] given his frail health and unlikelihood of recovery to meaningful quality of life should he experience a cardiac arrest and require resuscitation.  [name] verbalizes understanding, but again affirms that [name] has clearly stated his wishes that everything be done in such an event.  Patient is stable for now, I think it would be worth revisiting depending on his clinical situation.  Hunter Choi was advised that in cases of futility, if the harms of a treatment/procedures such as CPR outweigh the benefits. Mr Ohagan may not be offered this procedure.  Consider invoking [place] futility policy if needed.   Encouraged completion of advanced directive.   Elevated high-sensitivity troponin I  flat  pattern in the 2000s per cardio is most likely associated with decompensated heart failure and in the setting of renal insufficiency.  Acute on chronic renal failure with CKD stage IIIbat baseline.  Creatinine 2.21  --> 2.4. --> 2.74 --> 2.3 (baseline 1.6)  Potassium high normal --> WNL  Sodium 133 --> 138 and remains WNL  Cozaar has been held for now.  Trend BMP daily  Anemia, mild iron deficiency, likely CKD component  Trend CBC daily for now  Other chronic cardiac issues: Status post TAVR, prosthetic function normal by echocardiogram with mild perivalvular leak, Moderate to severe mitral regurgitation, CAD status post CABG with plan for medical therapy given poor revascularization options (occluded grafts).      DVT prophylaxis: heparin q12h Code Status: FULL Family Communication:  Disposition Plan: pending for now, hopefully home, possible w/ HH, PT/OT to evaluate    Consultants:   Cardiology - signed off   Palliative Care - cancelled, not needed I think after my discussion w/ son   Procedures:  Echo 03/29/18   Antimicrobials:  Anti-infectives (From admission, onward)   Start     Dose/Rate Route Frequency Ordered Stop   04/03/19 1000  cephALEXin (KEFLEX) capsule 500 mg  Status:  Discontinued     500 mg Oral Daily 04/02/19 1043 04/02/19 1406   04/03/19 1000  cephALEXin (KEFLEX) capsule 500 mg     500 mg Oral Every 12 hours 04/02/19 1406 04/08/19 0959   04/01/19 1000  cefTRIAXone (ROCEPHIN) 2 g in sodium chloride 0.9 % 100 mL IVPB  Status:  Discontinued     2 g 200 mL/hr over 30 Minutes Intravenous Daily 04/01/19 0817 04/02/19 1042       Subjective: Patient reports no complaints but he is not really carrying on a clear conversation  Objective: Vitals:   04/01/19 1143 04/01/19 1958 04/02/19 0439 04/02/19 1110  BP: (!) 99/53 114/61 104/61 (!) 106/56  Pulse: 62 68 66 68  Resp: 18 18 20 20   Temp: 97.6 F (36.4 C) 98.5 F (36.9 C) 98.4  F (36.9 C) 98.4 F (36.9 C)  TempSrc: Oral Oral Oral Oral  SpO2: 98%  98% 99%  Weight:   70.8 kg   Height:        Intake/Output Summary (Last 24 hours) at 04/02/2019 1503 Last data filed at 04/02/2019 1220 Gross per 24 hour  Intake 780 ml  Output 1251 ml  Net -471 ml   Filed Weights   03/31/19 0537 04/01/19 0531 04/02/19 0439  Weight: 71 kg 70.5 kg 70.8 kg    Examination:  General exam: Appears calm and comfortable  Respiratory system: Clear to auscultation except rales at bases. Respiratory effort normal. Cardiovascular system: S1 & S2 heard, irreg/irreg. No JVD, +systolic mitral murmur, NO rubs, gallops or clicks. No pedal edema. Gastrointestinal system: Abdomen is nondistended, soft and nontender. No organomegaly or masses felt. Normal bowel sounds heard. Central nervous system: Alert. No focal neurological deficits. Extremities: Symmetric 5 x 5 power. Skin: No rashes, lesions or ulcers Psychiatry: Judgement and insight unable to ascertain Mood & affect appropriate.     Data Reviewed: I have personally reviewed following labs and imaging studies  CBC: Recent Labs  Lab 03/30/19 1046 04/01/19 0646  WBC 9.9 8.8  NEUTROABS 5.4  --   HGB 8.6* 8.0*  HCT 29.2* 26.9*  MCV 91.5 90.0  PLT 181 162   Basic Metabolic Panel: Recent Labs  Lab 03/30/19 1046 03/31/19 0813 04/01/19 0646 04/02/19 0504  NA 133* 138 137 139  K 5.1 4.5 4.7 4.3  CL 99 102 100 101  CO2 22 22 24 26   GLUCOSE 139* 79 99 117*  BUN 78* 86* 96* 96*  CREATININE 2.21* 2.45* 2.74* 2.35*  CALCIUM 8.6* 8.6* 8.3* 8.6*   GFR: Estimated Creatinine Clearance: 19.9 mL/min (A) (by C-G formula based on SCr of 2.35 mg/dL (H)). Liver Function Tests: Recent Labs  Lab 03/30/19 1046  AST 161*  ALT 105*  ALKPHOS 107  BILITOT 0.9  PROT 6.7  ALBUMIN 3.4*   Recent Labs  Lab 03/30/19 1046  LIPASE 36   No results for input(s): AMMONIA in the last 168 hours. Coagulation Profile: No results for input(s):  INR, PROTIME in the last 168 hours. Cardiac Enzymes: No results for input(s): CKTOTAL, CKMB, CKMBINDEX, TROPONINI in the last 168 hours. BNP (last 3 results) No results for input(s): PROBNP in the last 8760 hours. HbA1C: No results for input(s): HGBA1C in the last 72 hours. CBG: No results for input(s): GLUCAP in the last 168 hours. Lipid Profile: No results for input(s): CHOL, HDL, LDLCALC, TRIG, CHOLHDL, LDLDIRECT in the last 72 hours. Thyroid Function Tests: No results for input(s): TSH, T4TOTAL, FREET4, T3FREE, THYROIDAB in  the last 72 hours. Anemia Panel: No results for input(s): VITAMINB12, FOLATE, FERRITIN, TIBC, IRON, RETICCTPCT in the last 72 hours. Urine analysis:    Component Value Date/Time   COLORURINE YELLOW 03/30/2019 2138   APPEARANCEUR HAZY (A) 03/30/2019 2138   LABSPEC 1.010 03/30/2019 2138   PHURINE 5.0 03/30/2019 2138   GLUCOSEU NEGATIVE 03/30/2019 2138   HGBUR SMALL (A) 03/30/2019 2138   BILIRUBINUR NEGATIVE 03/30/2019 2138   KETONESUR NEGATIVE 03/30/2019 2138   PROTEINUR NEGATIVE 03/30/2019 2138   UROBILINOGEN 4.0 (H) 11/14/2014 1849   NITRITE NEGATIVE 03/30/2019 2138   LEUKOCYTESUR MODERATE (A) 03/30/2019 2138   Sepsis Labs: @LABRCNTIP (procalcitonin:4,lacticidven:4)  Recent Results (from the past 240 hour(s))  Respiratory Panel by RT PCR (Flu A&B, Covid) - Nasopharyngeal Swab     Status: None   Collection Time: 03/30/19 11:23 AM   Specimen: Nasopharyngeal Swab  Result Value Ref Range Status   SARS Coronavirus 2 by RT PCR NEGATIVE NEGATIVE Final    Comment: (NOTE) SARS-CoV-2 target nucleic acids are NOT DETECTED. The SARS-CoV-2 RNA is generally detectable in upper respiratoy specimens during the acute phase of infection. The lowest concentration of SARS-CoV-2 viral copies this assay can detect is 131 copies/mL. A negative result does not preclude SARS-Cov-2 infection and should not be used as the sole basis for treatment or other patient management  decisions. A negative result may occur with  improper specimen collection/handling, submission of specimen other than nasopharyngeal swab, presence of viral mutation(s) within the areas targeted by this assay, and inadequate number of viral copies (<131 copies/mL). A negative result must be combined with clinical observations, patient history, and epidemiological information. The expected result is Negative. Fact Sheet for Patients:  PinkCheek.be Fact Sheet for Healthcare Providers:  GravelBags.it This test is not yet ap proved or cleared by the [place] FDA and  has been authorized for detection and/or diagnosis of SARS-CoV-2 by FDA under an Emergency Use Authorization (EUA). This EUA will remain  in effect (meaning this test can be used) for the duration of the COVID-19 declaration under Section 564(b)(1) of the Act, 21 U.S.C. section 360bbb-3(b)(1), unless the authorization is terminated or revoked sooner.    Influenza A by PCR NEGATIVE NEGATIVE Final   Influenza B by PCR NEGATIVE NEGATIVE Final    Comment: (NOTE) The Xpert Xpress SARS-CoV-2/FLU/RSV assay is intended as an aid in  the diagnosis of influenza from Nasopharyngeal swab specimens and  should not be used as a sole basis for treatment. Nasal washings and  aspirates are unacceptable for Xpert Xpress SARS-CoV-2/FLU/RSV  testing. Fact Sheet for Patients: PinkCheek.be Fact Sheet for Healthcare Providers: GravelBags.it This test is not yet approved or cleared by the [place] FDA and  has been authorized for detection and/or diagnosis of SARS-CoV-2 by  FDA under an Emergency Use Authorization (EUA). This EUA will remain  in effect (meaning this test can be used) for the duration of the  Covid-19 declaration under Section 564(b)(1) of the Act, 21  U.S.C. section 360bbb-3(b)(1), unless the authorization  is  terminated or revoked. Performed at [facility], [place] [address], [place], [place] [address]   Urine culture     Status: Abnormal   Collection Time: 03/30/19  9:38 PM   Specimen: Urine, Random  Result Value Ref Range Status   Specimen Description URINE, RANDOM  Final   Special Requests   Final    NONE Performed at [facility], [place] [address] [place], [place] [address]  Culture >=100,000 COLONIES/mL ESCHERICHIA COLI (A)  Final   Report Status 04/02/2019 FINAL  Final   Organism ID, Bacteria ESCHERICHIA COLI (A)  Final      Susceptibility   Escherichia coli - MIC*    AMPICILLIN 8 SENSITIVE Sensitive     CEFAZOLIN <=4 SENSITIVE Sensitive     CEFTRIAXONE <=0.25 SENSITIVE Sensitive     CIPROFLOXACIN <=0.25 SENSITIVE Sensitive     GENTAMICIN <=1 SENSITIVE Sensitive     IMIPENEM <=0.25 SENSITIVE Sensitive     NITROFURANTOIN <=16 SENSITIVE Sensitive     TRIMETH/SULFA <=20 SENSITIVE Sensitive     AMPICILLIN/SULBACTAM 4 SENSITIVE Sensitive     PIP/TAZO <=4 SENSITIVE Sensitive     * >=100,000 COLONIES/mL ESCHERICHIA COLI         Radiology Studies last 96 hours: DG Chest Port 1 View  Result Date: 03/31/2019 CLINICAL DATA:  Dyspnea on exertion. EXAM: PORTABLE CHEST 1 VIEW COMPARISON:  March 30, 2019. FINDINGS: Stable cardiomegaly. Status post coronary artery bypass graft. Status post transcatheter aortic valve repair. Atherosclerosis of thoracic aorta is noted. No pneumothorax is noted. Mild bibasilar subsegmental atelectasis or edema or infiltrates are noted. No significant pleural effusion is noted. Bony thorax is unremarkable. IMPRESSION: Aortic atherosclerosis. Stable cardiomegaly. Mild bibasilar subsegmental atelectasis or infiltrates are noted concerning for edema or infiltrates. Electronically Signed   By: Lupita Raider M.D.   On: 03/31/2019 09:21   DG Chest Portable 1 View  Result Date: 03/30/2019 CLINICAL DATA:  Cp and sob, low o2. Best obtainable due to pt  condition. EXAM: PORTABLE CHEST 1 VIEW COMPARISON:  Chest radiograph 02/18/2019 FINDINGS: Stable cardiomediastinal contours status post median sternotomy and CABG. There are new mild diffuse hazy interstitial opacities which could represent trace edema or atypical infection. Chronic faint reticular opacities at the lung bases could represent a mild fibrosis. Possible small right pleural effusion. IMPRESSION: Very mild diffuse interstitial opacities could represent trace pulmonary edema or atypical infection. Possible tiny right pleural effusion. Chronic changes at the bilateral lung bases. Electronically Signed   By: Emmaline Kluver M.D.   On: 03/30/2019 10:43   ECHOCARDIOGRAM LIMITED  Result Date: 03/30/2019   ECHOCARDIOGRAM LIMITED REPORT   Patient Name:   Hunter Choi Date of Exam: 03/30/2019 Medical Rec #:  [id number]      Height:       67.0 in Accession #:    [id number]     Weight:       150.8 lb Date of Birth:  11-10-1927      BSA:          1.79 m Patient Age:    89 years       BP:           108/62 mmHg Patient Gender: M              HR:           65 bpm. Exam Location:  Inpatient  Procedure: Limited Color Doppler, Cardiac Doppler and Limited Echo Indications:    acute systolic chf 428.21  History:        Patient has prior history of Echocardiogram examinations, most                 recent 02/19/2019. Aortic Valve: A 29mm Edwards Edwards Sapien                 bioprosthetic, stented aortic valve (TAVR)  Sonographer:    Delcie Roch Referring Phys: [id number] Ronnald Ramp  O'NEAL  Sonographer Comments: Image acquisition challenging due to uncooperative patient. IMPRESSIONS  1. Left ventricular ejection fraction, by visual estimation, is 25 to 30%. The left ventricle has severely decreased function. There is no increased left ventricular wall thickness.  2. Definity contrast agent was given IV to delineate the left ventricular endocardial borders.  3. Elevated left atrial pressure.  4. Left ventricular  diastolic parameters are consistent with Grade III diastolic dysfunction (restrictive).  5. The left ventricle demonstrates global hypokinesis.  6. No LV thrombus.  7. Global right ventricle has moderately reduced systolic function.The right ventricular size is moderately enlarged. no increase in right ventricular wall thickness.  8. Left atrial size was severely dilated.  9. Presence of pericardial fat pad. 10. Mild mitral annular calcification. 11. The mitral valve is degenerative. Moderate to severe mitral valve regurgitation. 12. The tricuspid valve was grossly normal. Tricuspid valve regurgitation is mild. 13. Tricuspid valve regurgitation is mild. 14. 29 mm Sapien 3. Normal gradients. Mild paravalvular leak. 15. TR signal is inadequate for assessing pulmonary artery systolic pressure. 16. The inferior vena cava is dilated in size with <50% respiratory variability, suggesting right atrial pressure of 15 mmHg. 17. A prior study was performed on 02/19/2019. 18. EF remains largely unchanged from prior. 19. No significant change from prior study. 20. The interatrial septum was not assessed. FINDINGS  Left Ventricle: Left ventricular ejection fraction, by visual estimation, is 25 to 30%. The left ventricle has severely decreased function. Definity contrast agent was given IV to delineate the left ventricular endocardial borders. The left ventricle demonstrates global hypokinesis. The left ventricular internal cavity size was the left ventricle is normal in size. There is no increased left ventricular wall thickness. Left ventricular diastolic parameters are consistent with Grade III diastolic dysfunction (restrictive). Elevated left atrial pressure. No LV thrombus. Right Ventricle: The right ventricular size is moderately enlarged. No increase in right ventricular wall thickness. Global RV systolic function is has moderately reduced systolic function. Left Atrium: Left atrial size was severely dilated. Right Atrium:  Right atrial size was mild-moderately dilated. Right atrial pressure is estimated at 15 mmHg. Pericardium: There is no evidence of pericardial effusion is seen. There is no evidence of pericardial effusion. Presence of pericardial fat pad. Mitral Valve: The mitral valve is degenerative in appearance. Mild mitral annular calcification. MV Area by PHT, 4.39 cm. MV PHT, 50.17 msec. Moderate to severe mitral valve regurgitation. Tricuspid Valve: The tricuspid valve is grossly normal. Tricuspid valve regurgitation is mild. Aortic Valve: The aortic valve has been repaired/replaced. Aortic valve regurgitation is mild. Aortic valve mean gradient measures 9.0 mmHg. Aortic valve peak gradient measures 15.4 mmHg. Aortic valve area, by VTI measures 1.92 cm. 29mm Edwards Edwards Sapien bioprosthetic, stented aortic valve (TAVR) valve is present in the aortic position. Echo findings show normal structure and function of the aortic prosthesis. 29 mm Sapien 3. Normal gradients. Mild paravalvular leak. Pulmonic Valve: The pulmonic valve was grossly normal. Pulmonic valve regurgitation is mild by color flow Doppler. Pulmonic regurgitation is mild by color flow Doppler. Aorta: The aortic root is normal in size and structure. Venous: The inferior vena cava is dilated in size with less than 50% respiratory variability, suggesting right atrial pressure of 15 mmHg. Shunts: The interatrial septum was not assessed. Additional Comments: A prior study was performed on 02/19/2019.  LEFT VENTRICLE          Normals PLAX 2D LVIDd:         5.20 cm  3.6 cm   Diastology                 Normals LVIDs:         4.30 cm  1.7 cm   LV e' lateral:   6.74 cm/s 6.42 cm/s LV PW:         1.30 cm  1.4 cm   LV E/e' lateral: 19.1      15.4 LV IVS:        1.10 cm  1.3 cm   LV e' medial:    4.24 cm/s 6.96 cm/s LVOT diam:     2.00 cm  2.0 cm   LV E/e' medial:  30.4      6.96 LV SV:         46 ml    79 ml LV SV Index:   25.74    45 ml/m2 LVOT Area:     3.14 cm  3.14 cm2  LEFT ATRIUM           Index LA diam:      5.10 cm 2.84 cm/m LA Vol (A4C): 67.1 ml 37.41 ml/m  AORTIC VALVE                    Normals AV Area (Vmax):    1.91 cm AV Area (Vmean):   1.77 cm     3.06 cm2 AV Area (VTI):     1.92 cm AV Vmax:           196.50 cm/s AV Vmean:          138.000 cm/s 77 cm/s AV VTI:            0.376 m      3.15 cm2 AV Peak Grad:      15.4 mmHg AV Mean Grad:      9.0 mmHg     3 mmHg LVOT Vmax:         119.50 cm/s LVOT Vmean:        77.900 cm/s  75 cm/s LVOT VTI:          0.230 m      25.3 cm LVOT/AV VTI ratio: 0.61         1  AORTA                 Normals Ao Root diam: 2.80 cm 31 mm MITRAL VALVE              Normals MV Area (PHT): 4.39 cm              SHUNTS MV PHT:        50.17 msec 55 ms      Systemic VTI:  0.23 m MV Decel Time: 173 msec   187 ms     Systemic Diam: 2.00 cm MV E velocity: 129.00 cm/s 103 cm/s MV A velocity: 65.30 cm/s  70.3 cm/s MV E/A ratio:  1.98        1.5  Lennie Odor MD Electronically signed by Lennie Odor MD Signature Date/Time: 03/30/2019/6:43:27 PMThe mitral valve is degenerative in appearance.    Final          Scheduled Meds:  acetaminophen  1,000 mg Oral BID   aspirin EC  81 mg Oral Daily   atorvastatin  20 mg Oral Daily   [START ON 04/03/2019] cephALEXin  500 mg Oral Q12H   clopidogrel  75 mg Oral Daily   feeding supplement (ENSURE ENLIVE)  237 mL  Oral BID BM   folic acid  3 mg Oral Daily   furosemide  40 mg Oral BID   heparin  5,000 Units Subcutaneous Q12H   levothyroxine  100 mcg Oral QAC breakfast   multivitamin with minerals  1 tablet Oral Daily   predniSONE  5 mg Oral Daily   sodium chloride flush  3 mL Intravenous Q12H   vitamin B-12  1,000 mcg Oral Daily   Continuous Infusions:  sodium chloride 250 mL (04/02/19 1012)     LOS: 3 days    Time spent: 35 mins       Sunnie Nielsen DO [facility]  Pager [phone]  or secure message in Epic

## 2019-04-02 NOTE — Evaluation (Signed)
Physical Therapy Evaluation Patient Details Name: Hunter Choi MRN: [id number] DOB: December 03, 1927 Today's Date: 04/02/2019   History of Present Illness  Pt is an 86 y.o. male admitted 03/30/19 with SOB, weight gain and AMS progressively over 2 weeks. (+) Ecoli. CXR with volume overload; worked up for acute on chronic CHF. PMH includes CAD s/p CABG, CHF, aortic stenosis s/p aortic valve replacement, AFIB, AAA, RA on chronic methotrexate and prednisone, CVA with L eye deficits.    Clinical Impression  Pt presents with an overall decrease in functional mobility secondary to above. PTA, pt lives with son, requiring assist for w/c transfers and assist with ADLs; pt difficult historian due to very HOH. Today, pt requiring modA for transfer to recliner. SpO2 97% on 2.5L O2. Pt would benefit from continued acute PT services to maximize functional mobility and independence prior to d/c with HHPT services if family able to continue providing necessary level of support.     Follow Up Recommendations Home health PT;Supervision/Assistance - 24 hour (as long as family able to continue providing 24/7 assist)    Equipment Recommendations  None recommended by PT    Recommendations for Other Services       Precautions / Restrictions Precautions Precautions: Fall Precaution Comments: Very HOH Restrictions Weight Bearing Restrictions: No      Mobility  Bed Mobility Overal bed mobility: Needs Assistance Bed Mobility: Supine to Sit     Supine to sit: Min assist;HOB elevated     General bed mobility comments: MinA for trunk elevation, pt initiating scooting with BUE support to EOB well  Transfers Overall transfer level: Needs assistance Equipment used: 1 person hand held assist Transfers: Sit to/from [employer] Sit to Stand: Mod assist;+2 safety/equipment   Squat pivot transfers: Mod assist;+2 safety/equipment     General transfer comment: Pt with forward flexed posture and  difficulty achieving fully upright, reaching UE to recliner rail for support to pivot; modA (+2 safety) for squat pivot from bed to recliner. Pt unable to take complete steps, assist to block L foot sliding  Ambulation/Gait                Stairs            Wheelchair Mobility    Modified Rankin (Stroke Patients Only)       Balance Overall balance assessment: Needs assistance   Sitting balance-Leahy Scale: Fair       Standing balance-Leahy Scale: Poor                               Pertinent Vitals/Pain Pain Assessment: No/denies pain    Home Living Family/patient expects to be discharged to:: Private residence Living Arrangements: Children(sons) Available Help at Discharge: Family;Available 24 hours/day Type of Home: House Home Access: Level entry     Home Layout: One level Home Equipment: Walker - 2 wheels;Wheelchair - manual;Bedside commode Additional Comments: Home info from recent previous admission    Prior Function Level of Independence: Needs assistance   Gait / Transfers Assistance Needed: Per chart, assist for transfer to/from wheelchair  ADL's / Homemaking Assistance Needed: Assist with ADLs        Hand Dominance        Extremity/Trunk Assessment   Upper Extremity Assessment Upper Extremity Assessment: RUE deficits/detail;LUE deficits/detail;Defer to OT evaluation(baseline hand/finger RA with deformity)    Lower Extremity Assessment Lower Extremity Assessment: RLE deficits/detail;LLE deficits/detail RLE Deficits / Details: Ankle  DF <3/5, hip and knee functionally at least 3/5, some baseline ROM restrictions LLE Deficits / Details: Ankle DF <3/5, hip and knee functionally at least 3/5, some baseline ROM restrictions       Communication   Communication: HOH  Cognition Arousal/Alertness: Awake/alert Behavior During Therapy: WFL for tasks assessed/performed Overall Cognitive Status: Difficult to assess                                  General Comments: When he could hear them, following majority of verbal and gestural commands appropriately. Apparent cognitive deficits likely exacerbated by very HOH      General Comments General comments (skin integrity, edema, etc.): Difficulty getting SpO2 reading via finger/ear probe; finger pulse ox reading 97% on 2.5L O2, HR 67    Exercises     Assessment/Plan    PT Assessment Patient needs continued PT services  PT Problem List Decreased strength;Decreased range of motion;Decreased activity tolerance;Decreased balance;Decreased mobility;Cardiopulmonary status limiting activity       PT Treatment Interventions DME instruction;Functional mobility training;Therapeutic activities;Therapeutic exercise;Balance training;Patient/family education;Wheelchair mobility training    PT Goals (Current goals can be found in the Care Plan section)  Acute Rehab PT Goals Patient Stated Goal: Agreeable to OOB PT Goal Formulation: With patient Time For Goal Achievement: 04/16/19    Frequency Min 3X/week   Barriers to discharge        Co-evaluation PT/OT/SLP Co-Evaluation/Treatment: Yes Reason for Co-Treatment: For patient/therapist safety;To address functional/ADL transfers PT goals addressed during session: Mobility/safety with mobility         AM-PAC PT "6 Clicks" Mobility  Outcome Measure Help needed turning from your back to your side while in a flat bed without using bedrails?: A Little Help needed moving from lying on your back to sitting on the side of a flat bed without using bedrails?: A Little Help needed moving to and from a bed to a chair (including a wheelchair)?: A Lot Help needed standing up from a chair using your arms (e.g., wheelchair or bedside chair)?: A Lot Help needed to walk in hospital room?: Total Help needed climbing 3-5 steps with a railing? : Total 6 Click Score: 12    End of Session Equipment Utilized During Treatment:  Oxygen Activity Tolerance: Patient tolerated treatment well Patient left: in chair;with call bell/phone within reach;with chair alarm set Nurse Communication: Mobility status PT Visit Diagnosis: Other abnormalities of gait and mobility (R26.89);Muscle weakness (generalized) (M62.81)    Time: 8469-6295 PT Time Calculation (min) (ACUTE ONLY): 24 min   Charges:   PT Evaluation $PT Eval Moderate Complexity: [place], PT, DPT Acute Rehabilitation Services  Pager [phone] Office [place] 04/02/2019, 10:33 AM

## 2019-04-02 NOTE — Progress Notes (Signed)
PROGRESS NOTE    Hunter Choi  CZY:[id number] DOB: [birth year] DOA: 03/30/2019 PCP: Hunter Ishihara, MD   Narrative:   Date of Admission:03/30/2019  LOS: Day 30   86 year old male with history of CAD/CABG, diastolic congestive heart failure, aortic stenosis s/p aortic valve replacement, paroxysmal atrial fib, AAA, rheumatoid arthritis on chronic methotrexate and prednisone, CVA with left eye deficits  Presented to ED 03/30/19 with complaints of shortness of breath and weight gain and altered mental status, ongoing about 2 weeks, failed to respond to outpatient escalation in diuretic therapy with Lasix, patient's son is primary caretaker.  ER course: volume overload versus pneumonia on chest x-ray, more likely volume overload.  Elevated troponins were trending down, EKG was not particularly concerning.  Cr reduced from baseline.   cardiology was consulted, advised against heparin drip elevated troponin is most likely due to demand ischemia/decompensated heart failure and in the setting of renal insufficiency, okay to leave on aspirin and Plavix and treat for CHF exacerbation Lasix 80 mg IV twice daily.   Echocardiogram 03/30/19: EF 25 to 30%, severely decreased left ventricular function, grade 3 diastolic dysfunction, right ventricle moderately reduced systolic function and moderate enlargement, moderate to severe mitral valve regurg, dilated IVC ~NO significant change from prior study.   03/31/19: History difficult to obtain on AM rounds, patient is somnolent, he is arousable but not very communicative.    Output is inadequate, weight down by about 1 pound.    Cardiology reports that prognosis is poor, agree with palliative care consultation.  Continue IV Lasix 80 mg twice daily, hold Cozaar/Coreg, continue aspirin/Plavix/Lipitor.   I rechecked patient this afternoon 4:00 PM. He's about the same, a bit more awake but not really able to converse with me.   Attempted to contact  patient's son but he was not available  04/01/19: Patient about the same from yesterday 03/31/2019, with regard to mental status.  He is easily arousable/alert but seems confused and is not very communicative.  Overnight: no events per chart, Output still not impressive  Pending: clinical improvement  Monitoring labs: Na better, Cr worse,   (+)transaminitis   UCx (+)Ecoli - previous culture showed (S)Cipro though this was 09/2015, will start Rocephin per pharmacy recs   PT/OT to eval - pt lives at home with 2 sons . This morning, I reduced Lasix dose to 40 mg IV after nurse alrted me to systolic BP borderline, if BP stays ok next couple hours I certainly see no problem giving another 40 and then back up to 80 bid . Dispo: here today   . Visited again in PM, patient is much more alert, awake. He is HoH but can understand when we yell! Son present at bedside, all questions answered.   Today 04/02/19: I visited later in the day since he seems more alert in afternoons.   Physical therapy: recommends home health w/ PT  I&O still not too impressive. Will increase Lasix to 80 mg po bid   Ambulate w/ SaO2 tomorrow AM and CXR AM.       Assessment & Plan:   Active Problems:   CHF (congestive heart failure) (HCC)  Acute on chronic systolic heart failure.   Cardiology following, appreciate their recommendations.  Echocardiogram reveals LVEF 25 to 30% with grade 3 diastolic dysfunction and global hypokinesis. Also has moderate RV dysfunction.   Diuresing on high-dose IV Lasix 80 milligrams twice daily but output inadequate and no significant clinical improvement, reduced dose given BP low and worse  Cr, I increased again today to po 80 bid and can switch to IV 80 if needed   hold Cozaar/Coreg, continue aspirin/Plavix/Lipitor  Cardiology has signed off for now  Ambulate w/ SaO2 tomorrow AM and CXR AM.   I think if he does ok overnight on po meds he can probably go home tomorrow  04/03/19  Elevated high-sensitivity troponin I  flat pattern in the 2000s per cardio is most likely associated with decompensated heart failure and in the setting of renal insufficiency.  Acute on chronic renal failure with CKD stage IIIbat baseline.  Creatinine 2.21  --> 2.4. --> 2.74  --> 2.35  Potassium high normal --> WNL  Sodium 133 --> 138 and remains WNL  Cozaar has been held for now.  Trend BMP daily and close follow-up outpatient  Anemia, mild iron deficiency, likely CKD component  Trend CBC, Hgb 8.6 on admission  Discussion 04/01/2019 on goals of care Debility, likely end stage heart failure  Given poor prognosis, worth a frank discussion regarding goals of care.    I was able to speak with the patient's son, Hunter Choi, who has been his primary caregiver at home for years.  The patient lives at home with Hunter Choi and another son.  Family relations sound amicable.    I discussed with Hunter Choi that Hunter Choi is probably end-stage heart failure, I am concerned given that he remains full code as I think CPR/resuscitation efforts would probably be futile in his case.    With regard to issues of quality of life, Hunter Choi tells me that typically, Hunter Choi is alert, talkative, happy - but he tends to become confused when he is in the hospital.  This has happened a couple of times.   The family has had discussion with regard to goals of care in terms of resuscitation, Hunter Choi had to deal with some of this when his wife was ill and she is deceased.  Per Hunter Choi's report, Hunter Choi would want all resuscitative attempts made.  He does not have an advanced directive in place, however.   I advised Hunter Choi that resuscitation measures, particularly CPR, can be quite painful/traumatic, and I would not be very comfortable performing CPR on Hunter Choi given his frail health and unlikelihood of recovery to meaningful quality of life should he experience a cardiac arrest and require resuscitation.  Hunter Choi  verbalizes understanding, but again affirms that Hunter Choi has clearly stated his wishes that everything be done in such an event.  Patient is stable for now, I think it would be worth revisiting depending on his clinical situation.  Hunter Choi was advised that in cases of futility, if the harms of a treatment/procedures such as CPR outweigh the benefits. Mr Macmaster may not be offered this procedure.  Consider invoking [place] futility policy if needed.   Encouraged completion of advanced directive.   Other chronic cardiac issues: Status post TAVR, prosthetic function normal by echocardiogram with mild perivalvular leak, Moderate to severe mitral regurgitation, CAD status post CABG with plan for medical therapy given poor revascularization options (occluded grafts).  Cardiology is signed off       DVT prophylaxis: heparin q12h Code Status: FULL Family Communication:  Disposition Plan: pending for now, hopefully home tomorrow, w/ [facility]   Consultants:   Cardiology   Palliative Care - cancelled, not needed I think after my discussion w/ son   Procedures:  Echo 03/29/18   Antimicrobials:  Anti-infectives (From admission, onward)   Start  Dose/Rate Route Frequency Ordered Stop   04/03/19 1000  cephALEXin (KEFLEX) capsule 500 mg     500 mg Oral Daily 04/02/19 1043 04/08/19 0959   04/01/19 1000  cefTRIAXone (ROCEPHIN) 2 g in sodium chloride 0.9 % 100 mL IVPB  Status:  Discontinued     2 g 200 mL/hr over 30 Minutes Intravenous Daily 04/01/19 0817 04/02/19 1042       Subjective: Patient reports no complaints but he is not really carrying ona  Conversation, seems confused but he is not combative or agitated  Objective: Vitals:   04/01/19 1143 04/01/19 1958 04/02/19 0439 04/02/19 1110  BP: (!) 99/53 114/61 104/61 (!) 106/56  Pulse: 62 68 66 68  Resp: 18 18 20 20   Temp: 97.6 F (36.4 C) 98.5 F (36.9 C) 98.4 F (36.9 C) 98.4 F (36.9 C)  TempSrc: Oral Oral Oral  Oral  SpO2: 98%  98% 99%  Weight:   70.8 kg   Height:        Intake/Output Summary (Last 24 hours) at 04/02/2019 1123 Last data filed at 04/02/2019 0900 Gross per 24 hour  Intake 1184.28 ml  Output 952 ml  Net 232.28 ml   Filed Weights   03/31/19 0537 04/01/19 0531 04/02/19 0439  Weight: 71 kg 70.5 kg 70.8 kg    Examination:  General exam: Appears calm and comfortable  Respiratory system: Clear to auscultation. Respiratory effort normal. Cardiovascular system: S1 & S2 heard, irreg/irreg. No JVD, +syseolic mitral murmurs, NO rubs, gallops or clicks. Trace pedal edema. Gastrointestinal system: Abdomen is nondistended, soft and nontender. No organomegaly or masses felt. Normal bowel sounds heard. Central nervous system: Alert. No focal neurological deficits. Extremities: Symmetric 5 x 5 power. Skin: No rashes, lesions or ulcers Psychiatry: Judgement and insight unable to ascertain Mood & affect appropriate.     Data Reviewed: I have personally reviewed following labs and imaging studies  CBC: Recent Labs  Lab 03/30/19 1046 04/01/19 0646  WBC 9.9 8.8  NEUTROABS 5.4  --   HGB 8.6* 8.0*  HCT 29.2* 26.9*  MCV 91.5 90.0  PLT 181 527   Basic Metabolic Panel: Recent Labs  Lab 03/30/19 1046 03/31/19 0813 04/01/19 0646 04/02/19 0504  NA 133* 138 137 139  K 5.1 4.5 4.7 4.3  CL 99 102 100 101  CO2 22 22 24 26   GLUCOSE 139* 79 99 117*  BUN 78* 86* 96* 96*  CREATININE 2.21* 2.45* 2.74* 2.35*  CALCIUM 8.6* 8.6* 8.3* 8.6*   GFR: Estimated Creatinine Clearance: 19.9 mL/min (A) (by C-G formula based on SCr of 2.35 mg/dL (H)). Liver Function Tests: Recent Labs  Lab 03/30/19 1046  AST 161*  ALT 105*  ALKPHOS 107  BILITOT 0.9  PROT 6.7  ALBUMIN 3.4*   Recent Labs  Lab 03/30/19 1046  LIPASE 36   No results for input(s): AMMONIA in the last 168 hours. Coagulation Profile: No results for input(s): INR, PROTIME in the last 168 hours. Cardiac Enzymes: No results for  input(s): CKTOTAL, CKMB, CKMBINDEX, TROPONINI in the last 168 hours. BNP (last 3 results) No results for input(s): PROBNP in the last 8760 hours. HbA1C: No results for input(s): HGBA1C in the last 72 hours. CBG: No results for input(s): GLUCAP in the last 168 hours. Lipid Profile: No results for input(s): CHOL, HDL, LDLCALC, TRIG, CHOLHDL, LDLDIRECT in the last 72 hours. Thyroid Function Tests: No results for input(s): TSH, T4TOTAL, FREET4, T3FREE, THYROIDAB in the last 72 hours. Anemia Panel: Recent Labs  03/30/19 1350 03/30/19 1409  FERRITIN 98  --   TIBC 361  --   IRON 22*  --   RETICCTPCT  --  3.2*   Urine analysis:    Component Value Date/Time   COLORURINE YELLOW 03/30/2019 2138   APPEARANCEUR HAZY (A) 03/30/2019 2138   LABSPEC 1.010 03/30/2019 2138   PHURINE 5.0 03/30/2019 2138   GLUCOSEU NEGATIVE 03/30/2019 2138   HGBUR SMALL (A) 03/30/2019 2138   BILIRUBINUR NEGATIVE 03/30/2019 2138   KETONESUR NEGATIVE 03/30/2019 2138   PROTEINUR NEGATIVE 03/30/2019 2138   UROBILINOGEN 4.0 (H) 11/14/2014 1849   NITRITE NEGATIVE 03/30/2019 2138   LEUKOCYTESUR MODERATE (A) 03/30/2019 2138   Sepsis Labs: (procalcitonin:4,lacticidven:4)  Recent Results (from the past 240 hour(s))  Respiratory Panel by RT PCR (Flu A&B, Covid) - Nasopharyngeal Swab     Status: None   Collection Time: 03/30/19 11:23 AM   Specimen: Nasopharyngeal Swab  Result Value Ref Range Status   SARS Coronavirus 2 by RT PCR NEGATIVE NEGATIVE Final    Comment: (NOTE) SARS-CoV-2 target nucleic acids are NOT DETECTED. The SARS-CoV-2 RNA is generally detectable in upper respiratoy specimens during the acute phase of infection. The lowest concentration of SARS-CoV-2 viral copies this assay can detect is 131 copies/mL. A negative result does not preclude SARS-Cov-2 infection and should not be used as the sole basis for treatment or other patient management decisions. A negative result may occur with    improper specimen collection/handling, submission of specimen other than nasopharyngeal swab, presence of viral mutation(s) within the areas targeted by this assay, and inadequate number of viral copies (<131 copies/mL). A negative result must be combined with clinical observations, patient history, and epidemiological information. The expected result is Negative. Fact Sheet for Patients:  https://www.moore.com/ Fact Sheet for Healthcare Providers:  https://www.young.biz/ This test is not yet ap proved or cleared by the [place] FDA and  has been authorized for detection and/or diagnosis of SARS-CoV-2 by FDA under an Emergency Use Authorization (EUA). This EUA will remain  in effect (meaning this test can be used) for the duration of the COVID-19 declaration under Section 564(b)(1) of the Act, 21 U.S.C. section 360bbb-3(b)(1), unless the authorization is terminated or revoked sooner.    Influenza A by PCR NEGATIVE NEGATIVE Final   Influenza B by PCR NEGATIVE NEGATIVE Final    Comment: (NOTE) The Xpert Xpress SARS-CoV-2/FLU/RSV assay is intended as an aid in  the diagnosis of influenza from Nasopharyngeal swab specimens and  should not be used as a sole basis for treatment. Nasal washings and  aspirates are unacceptable for Xpert Xpress SARS-CoV-2/FLU/RSV  testing. Fact Sheet for Patients: https://www.moore.com/ Fact Sheet for Healthcare Providers: https://www.young.biz/ This test is not yet approved or cleared by the [place] FDA and  has been authorized for detection and/or diagnosis of SARS-CoV-2 by  FDA under an Emergency Use Authorization (EUA). This EUA will remain  in effect (meaning this test can be used) for the duration of the  Covid-19 declaration under Section 564(b)(1) of the Act, 21  U.S.C. section 360bbb-3(b)(1), unless the authorization is  terminated or revoked. Performed at  [facility], [address] [place], [place] [address]   Urine culture     Status: Abnormal   Collection Time: 03/30/19  9:38 PM   Specimen: Urine, Random  Result Value Ref Range Status   Specimen Description URINE, RANDOM  Final   Special Requests   Final    NONE Performed at [facility]  [facility]ospital Lab, [address], [place], [place] [address]    Culture >=100,000 COLONIES/mL ESCHERICHIA COLI (A)  Final   Report Status 04/02/2019 FINAL  Final   Organism ID, Bacteria ESCHERICHIA COLI (A)  Final      Susceptibility   Escherichia coli - MIC*    AMPICILLIN 8 SENSITIVE Sensitive     CEFAZOLIN <=4 SENSITIVE Sensitive     CEFTRIAXONE <=0.25 SENSITIVE Sensitive     CIPROFLOXACIN <=0.25 SENSITIVE Sensitive     GENTAMICIN <=1 SENSITIVE Sensitive     IMIPENEM <=0.25 SENSITIVE Sensitive     NITROFURANTOIN <=16 SENSITIVE Sensitive     TRIMETH/SULFA <=20 SENSITIVE Sensitive     AMPICILLIN/SULBACTAM 4 SENSITIVE Sensitive     PIP/TAZO <=4 SENSITIVE Sensitive     * >=100,000 COLONIES/mL ESCHERICHIA COLI         Radiology Studies last 96 hours: DG Chest Port 1 View  Result Date: 03/31/2019 CLINICAL DATA:  Dyspnea on exertion. EXAM: PORTABLE CHEST 1 VIEW COMPARISON:  March 30, 2019. FINDINGS: Stable cardiomegaly. Status post coronary artery bypass graft. Status post transcatheter aortic valve repair. Atherosclerosis of thoracic aorta is noted. No pneumothorax is noted. Mild bibasilar subsegmental atelectasis or edema or infiltrates are noted. No significant pleural effusion is noted. Bony thorax is unremarkable. IMPRESSION: Aortic atherosclerosis. Stable cardiomegaly. Mild bibasilar subsegmental atelectasis or infiltrates are noted concerning for edema or infiltrates. Electronically Signed   By: Lupita Raider M.D.   On: 03/31/2019 09:21   DG Chest Portable 1 View  Result Date: 03/30/2019 CLINICAL DATA:  Cp and sob, low o2. Best obtainable due to pt condition. EXAM: PORTABLE CHEST 1 VIEW  COMPARISON:  Chest radiograph 02/18/2019 FINDINGS: Stable cardiomediastinal contours status post median sternotomy and CABG. There are new mild diffuse hazy interstitial opacities which could represent trace edema or atypical infection. Chronic faint reticular opacities at the lung bases could represent a mild fibrosis. Possible small right pleural effusion. IMPRESSION: Very mild diffuse interstitial opacities could represent trace pulmonary edema or atypical infection. Possible tiny right pleural effusion. Chronic changes at the bilateral lung bases. Electronically Signed   By: Emmaline Kluver M.D.   On: 03/30/2019 10:43   ECHOCARDIOGRAM LIMITED  Result Date: 03/30/2019   ECHOCARDIOGRAM LIMITED REPORT   Patient Name:   Hunter Choi Date of Exam: 03/30/2019 Medical Rec #:  [id number]      Height:       67.0 in Accession #:    [id number]     Weight:       150.8 lb Date of Birth:  11-10-1927      BSA:          1.79 m Patient Age:    89 years       BP:           108/62 mmHg Patient Gender: M              HR:           65 bpm. Exam Location:  Inpatient  Procedure: Limited Color Doppler, Cardiac Doppler and Limited Echo Indications:    acute systolic chf 428.21  History:        Patient has prior history of Echocardiogram examinations, most                 recent 02/19/2019. Aortic Valve: A 43mm Edwards Edwards Sapien                 bioprosthetic, stented aortic valve (TAVR)  Sonographer:    Delcie Roch Referring Phys: [id number] [place] THOMAS O'NEAL  Sonographer Comments: Image acquisition challenging due to uncooperative patient. IMPRESSIONS  1. Left ventricular ejection fraction, by visual estimation, is 25 to 30%. The left ventricle has severely decreased function. There is no increased left ventricular wall thickness.  2. Definity contrast agent was given IV to delineate the left ventricular endocardial borders.  3. Elevated left atrial pressure.  4. Left ventricular diastolic parameters are consistent with  Grade III diastolic dysfunction (restrictive).  5. The left ventricle demonstrates global hypokinesis.  6. No LV thrombus.  7. Global right ventricle has moderately reduced systolic function.The right ventricular size is moderately enlarged. no increase in right ventricular wall thickness.  8. Left atrial size was severely dilated.  9. Presence of pericardial fat pad. 10. Mild mitral annular calcification. 11. The mitral valve is degenerative. Moderate to severe mitral valve regurgitation. 12. The tricuspid valve was grossly normal. Tricuspid valve regurgitation is mild. 13. Tricuspid valve regurgitation is mild. 14. 29 mm Sapien 3. Normal gradients. Mild paravalvular leak. 15. TR signal is inadequate for assessing pulmonary artery systolic pressure. 16. The inferior vena cava is dilated in size with <50% respiratory variability, suggesting right atrial pressure of 15 mmHg. 17. A prior study was performed on 02/19/2019. 18. EF remains largely unchanged from prior. 19. No significant change from prior study. 20. The interatrial septum was not assessed. FINDINGS  Left Ventricle: Left ventricular ejection fraction, by visual estimation, is 25 to 30%. The left ventricle has severely decreased function. Definity contrast agent was given IV to delineate the left ventricular endocardial borders. The left ventricle demonstrates global hypokinesis. The left ventricular internal cavity size was the left ventricle is normal in size. There is no increased left ventricular wall thickness. Left ventricular diastolic parameters are consistent with Grade III diastolic dysfunction (restrictive). Elevated left atrial pressure. No LV thrombus. Right Ventricle: The right ventricular size is moderately enlarged. No increase in right ventricular wall thickness. Global RV systolic function is has moderately reduced systolic function. Left Atrium: Left atrial size was severely dilated. Right Atrium: Right atrial size was mild-moderately  dilated. Right atrial pressure is estimated at 15 mmHg. Pericardium: There is no evidence of pericardial effusion is seen. There is no evidence of pericardial effusion. Presence of pericardial fat pad. Mitral Valve: The mitral valve is degenerative in appearance. Mild mitral annular calcification. MV Area by PHT, 4.39 cm. MV PHT, 50.17 msec. Moderate to severe mitral valve regurgitation. Tricuspid Valve: The tricuspid valve is grossly normal. Tricuspid valve regurgitation is mild. Aortic Valve: The aortic valve has been repaired/replaced. Aortic valve regurgitation is mild. Aortic valve mean gradient measures 9.0 mmHg. Aortic valve peak gradient measures 15.4 mmHg. Aortic valve area, by VTI measures 1.92 cm. 51mm Edwards Edwards Sapien bioprosthetic, stented aortic valve (TAVR) valve is present in the aortic position. Echo findings show normal structure and function of the aortic prosthesis. 29 mm Sapien 3. Normal gradients. Mild paravalvular leak. Pulmonic Valve: The pulmonic valve was grossly normal. Pulmonic valve regurgitation is mild by color flow Doppler. Pulmonic regurgitation is mild by color flow Doppler. Aorta: The aortic root is normal in size and structure. Venous: The inferior vena cava is dilated in size with less than 50% respiratory variability, suggesting right atrial pressure of 15 mmHg. Shunts: The interatrial septum was not assessed. Additional Comments: A prior study was performed on 02/19/2019.  LEFT VENTRICLE          Normals PLAX 2D LVIDd:  5.20 cm  3.6 cm   Diastology                 Normals LVIDs:         4.30 cm  1.7 cm   LV e' lateral:   6.74 cm/s 6.42 cm/s LV PW:         1.30 cm  1.4 cm   LV E/e' lateral: 19.1      15.4 LV IVS:        1.10 cm  1.3 cm   LV e' medial:    4.24 cm/s 6.96 cm/s LVOT diam:     2.00 cm  2.0 cm   LV E/e' medial:  30.4      6.96 LV SV:         46 ml    79 ml LV SV Index:   25.74    45 ml/m2 LVOT Area:     3.14 cm 3.14 cm2  LEFT ATRIUM           Index LA  diam:      5.10 cm 2.84 cm/m LA Vol (A4C): 67.1 ml 37.41 ml/m  AORTIC VALVE                    Normals AV Area (Vmax):    1.91 cm AV Area (Vmean):   1.77 cm     3.06 cm2 AV Area (VTI):     1.92 cm AV Vmax:           196.50 cm/s AV Vmean:          138.000 cm/s 77 cm/s AV VTI:            0.376 m      3.15 cm2 AV Peak Grad:      15.4 mmHg AV Mean Grad:      9.0 mmHg     3 mmHg LVOT Vmax:         119.50 cm/s LVOT Vmean:        77.900 cm/s  75 cm/s LVOT VTI:          0.230 m      25.3 cm LVOT/AV VTI ratio: 0.61         1  AORTA                 Normals Ao Root diam: 2.80 cm 31 mm MITRAL VALVE              Normals MV Area (PHT): 4.39 cm              SHUNTS MV PHT:        50.17 msec 55 ms      Systemic VTI:  0.23 m MV Decel Time: 173 msec   187 ms     Systemic Diam: 2.00 cm MV E velocity: 129.00 cm/s 103 cm/s MV A velocity: 65.30 cm/s  70.3 cm/s MV E/A ratio:  1.98        1.5  Lennie Odor MD Electronically signed by Lennie Odor MD Signature Date/Time: 03/30/2019/6:43:27 PMThe mitral valve is degenerative in appearance.    Final          Scheduled Meds: . acetaminophen  1,000 mg Oral BID  . aspirin EC  81 mg Oral Daily  . atorvastatin  20 mg Oral Daily  . [START ON 04/03/2019] cephALEXin  500 mg Oral Daily  . clopidogrel  75 mg Oral Daily  . feeding supplement (ENSURE ENLIVE)  237 mL Oral BID BM  . folic acid  3 mg Oral Daily  . furosemide  40 mg Oral BID  . heparin  5,000 Units Subcutaneous Q12H  . levothyroxine  100 mcg Oral QAC breakfast  . multivitamin with minerals  1 tablet Oral Daily  . predniSONE  5 mg Oral Daily  . sodium chloride flush  3 mL Intravenous Q12H  . vitamin B-12  1,000 mcg Oral Daily   Continuous Infusions: . sodium chloride 250 mL (04/02/19 1012)     LOS: 3 days    Time spent: 35 mins       Sunnie Nielsen DO [facility]  Pager [phone]  or secure message in Epic

## 2019-04-02 NOTE — Plan of Care (Signed)

## 2019-04-03 ENCOUNTER — Inpatient Hospital Stay (HOSPITAL_COMMUNITY): Payer: Medicare Other

## 2019-04-03 DIAGNOSIS — B9620 Unspecified Escherichia coli [E. coli] as the cause of diseases classified elsewhere: Secondary | ICD-10-CM

## 2019-04-03 DIAGNOSIS — N390 Urinary tract infection, site not specified: Secondary | ICD-10-CM

## 2019-04-03 DIAGNOSIS — G9341 Metabolic encephalopathy: Secondary | ICD-10-CM

## 2019-04-03 DIAGNOSIS — R627 Adult failure to thrive: Secondary | ICD-10-CM

## 2019-04-03 DIAGNOSIS — I5023 Acute on chronic systolic (congestive) heart failure: Secondary | ICD-10-CM

## 2019-04-03 DIAGNOSIS — N179 Acute kidney failure, unspecified: Secondary | ICD-10-CM

## 2019-04-03 DIAGNOSIS — R7401 Elevation of levels of liver transaminase levels: Secondary | ICD-10-CM

## 2019-04-03 LAB — BASIC METABOLIC PANEL
Anion gap: 13 (ref 5–15)
BUN: 95 mg/dL — ABNORMAL HIGH (ref 8–23)
CO2: 26 mmol/L (ref 22–32)
Calcium: 8.4 mg/dL — ABNORMAL LOW (ref 8.9–10.3)
Chloride: 100 mmol/L (ref 98–111)
Creatinine, Ser: 2.05 mg/dL — ABNORMAL HIGH (ref 0.61–1.24)
GFR calc Af Amer: 32 mL/min — ABNORMAL LOW (ref >60)
GFR calc non Af Amer: 28 mL/min — ABNORMAL LOW (ref >60)
Glucose, Bld: 103 mg/dL — ABNORMAL HIGH (ref 70–99)
Potassium: 4.2 mmol/L (ref 3.5–5.1)
Sodium: 139 mmol/L (ref 135–145)

## 2019-04-03 LAB — OCCULT BLOOD X 1 CARD TO LAB, STOOL: Fecal Occult Bld: NEGATIVE

## 2019-04-03 MED ORDER — LORAZEPAM 2 MG/ML IJ SOLN
1.0000 mg | Freq: Once | INTRAMUSCULAR | Status: AC
  Administered 2019-04-03: 1 mg via INTRAVENOUS
  Filled 2019-04-03: qty 1

## 2019-04-03 MED ORDER — METOPROLOL TARTRATE 12.5 MG HALF TABLET
12.5000 mg | ORAL_TABLET | Freq: Two times a day (BID) | ORAL | Status: DC
  Administered 2019-04-03 – 2019-04-05 (×4): 12.5 mg via ORAL
  Filled 2019-04-03 (×4): qty 1

## 2019-04-03 MED ORDER — ENSURE ENLIVE PO LIQD
237.0000 mL | Freq: Three times a day (TID) | ORAL | Status: DC
  Administered 2019-04-03: 237 mL via ORAL
  Administered 2019-04-04: 80 mL via ORAL
  Administered 2019-04-04: 237 mL via ORAL
  Administered 2019-04-05: 100 mL via ORAL
  Administered 2019-04-05: 237 mL via ORAL
  Administered 2019-04-05: 150 mL via ORAL
  Administered 2019-04-06: 237 mL via ORAL

## 2019-04-03 NOTE — Care Management Important Message (Signed)
Important Message  Patient Details  Name: Hunter Choi MRN: [id number] Date of Birth: December 26, 1927   Medicare Important Message Given:  Yes     Renie Ora 04/03/2019, 12:30 PM

## 2019-04-03 NOTE — Progress Notes (Signed)
PROGRESS NOTE  Hunter Choi PYP:[id number] DOB: [birth year] DOA: 03/30/2019 PCP: Leeroy Cha, MD    86 year old male with history ofCAD/CABG, diastolic congestive heart failure, aortic stenosiss/paortic valve replacement, paroxysmal atrial fib, AAA, rheumatoid arthritis on chronic methotrexate and prednisone, CVA with left eye deficits  Presented to ED01/02/21withcomplaints of shortness of breath and weight gain and altered mental status, ongoing about 2 weeks, failed to respond to outpatient escalation in diuretic therapy with Lasix, patient's son is primary caretaker.  ER course:volume overload versus pneumonia on chest x-ray, more likely volume overload. Elevated troponins were trending down, EKG was not particularly concerning. Crreduced from baseline.  cardiology was consulted, advised against heparin drip elevated troponin is most likely due to demand ischemia/decompensated heart failure and in the setting of renal insufficiency, okay to leave on aspirin and Plavix and treat for CHF exacerbation Lasix 80 mg IV twice daily.   Echocardiogram01/02/21: EF 25 to 30%, severely decreased left ventricular function, grade 3 diastolic dysfunction, right ventricle moderately reduced systolic function and moderate enlargement, moderate to severe mitral valve regurg,dilatedIVC~NOsignificant change from prior study.      HPI/Recap of past 24 hours:  He is confused, not following command  Assessment/Plan: Active Problems:   CHF (congestive heart failure) (HCC)  Acute on chronic systolic heart failure. -Echocardiogram reveals LVEF 25 to 30% with grade 3 diastolic dysfunction and global hypokinesis. Also has moderate RV dysfunction -on lasix 20mg  bid at home, currently he is on lasix 80mg  bid -per chart review , he was on coreg and cozaar at home, currently, he is not on these per cardiology recommendation, please referto cards note from 1/3 -overall negative  1.4 liters since admission, continue diuresis, wean oxygen -Cardiology recommended palliative care consult  Transaminitis Likely from liver congestion from heart failure exacerbation We will repeat LFT in the morning, we will add ammonia level due to confusion   Nonsustained V. Tach, 6 beats on telemetry -He was on Coreg prior to hospitalization ,I do not see Coreg on his current med list -We will start low-dose Lopressor due to borderline blood pressure, plan to transition back to low dose coreg if bp is able to tolerate, will discuss with cardiology -will check potassium magnesium in the a.m. -Keep on telemetry  CAD status post CABG -CAD status post CABG with plan for medical therapy given poor revascularization options (occluded grafts). -on asa, plavix and statin  Other chronic cardiac issues:Status post TAVR, prosthetic function normal by echocardiogram with mild perivalvular leak,Moderate to severe mitral regurgitation,  Acute on chronic renal failure with CKD stage IIIbat baseline./Ecoli uti -he was treated with rocephin on 1/4 and 1/5, he is started on kefelx from 1/6 -cr improving, renal dosing meds, repeat bmp in am, will get renal US   Normocytic anemia, likely anemia of chronic disease hgb range from 8-9, close to baseline Will add on FOBT  Encephalopathy/delirium? Per son ,patient at baseline is "alert, talkative, happy" "but he tends to become confused when he is in the hospital.  This has happened a couple of times." Frequent reorientation   RA chronically immunosuppressed on methotrexate and prednisone at home Methotrexate held due to abnormal liver function  Hypothyroidism:  Continue synthroid  FTT , appear to have good family supports   Per PT recommendation" Home health PT;Supervision/Assistance - 24 hour (as long as family able to continue providing 24/7 assist)" Previous attending Dr Sheppard Coil has goals of care discussion on 1/5, family confirmed  full code, details please refer to Dr  Alexander's note on 1/5  I think patient and family will benefit from meeting with palliative care for heart failure palliative care supports, formal consult placed. Patient and family will likely benefit from community palliative care service at discharge as well  DVT Prophylaxis:heparin  Code Status: full  Family Communication: patient   Disposition Plan: remain very confused, will repeat labs including ammonia level, consider get ct head if patient is able to cooperate with exam   Consultants:  Cardiology  Palliative care  Procedures:  none  Antibiotics:  Rocephin then keflex   Objective: BP 111/70 (BP Location: Right Arm)   Pulse 72   Temp 97.6 F (36.4 C) (Oral)   Resp 18   Ht 5\' 7"  (1.702 m)   Wt 70.6 kg   SpO2 98%   BMI 24.38 kg/m   Intake/Output Summary (Last 24 hours) at 04/03/2019 1638 Last data filed at 04/03/2019 1300 Gross per 24 hour  Intake 500 ml  Output 900 ml  Net -400 ml   Filed Weights   04/01/19 0531 04/02/19 0439 04/03/19 0510  Weight: 70.5 kg 70.8 kg 70.6 kg    Exam: Patient is examined daily including today on 04/03/2019, exams remain the same as of yesterday except that has changed    General:  Confused, not following commands, bilateral mittens on  Cardiovascular: RRR  Respiratory: CTABL  Abdomen: Soft/ND/NT, positive BS  Musculoskeletal: No Edema  Neuro: confused, moving all extremities  Data Reviewed: Basic Metabolic Panel: Recent Labs  Lab 03/30/19 1046 03/31/19 0813 04/01/19 0646 04/02/19 0504 04/03/19 0900  NA 133* 138 137 139 139  K 5.1 4.5 4.7 4.3 4.2  CL 99 102 100 101 100  CO2 22 22 24 26 26   GLUCOSE 139* 79 99 117* 103*  BUN 78* 86* 96* 96* 95*  CREATININE 2.21* 2.45* 2.74* 2.35* 2.05*  CALCIUM 8.6* 8.6* 8.3* 8.6* 8.4*   Liver Function Tests: Recent Labs  Lab 03/30/19 1046  AST 161*  ALT 105*  ALKPHOS 107  BILITOT 0.9  PROT 6.7  ALBUMIN 3.4*   Recent  Labs  Lab 03/30/19 1046  LIPASE 36   No results for input(s): AMMONIA in the last 168 hours. CBC: Recent Labs  Lab 03/30/19 1046 04/01/19 0646  WBC 9.9 8.8  NEUTROABS 5.4  --   HGB 8.6* 8.0*  HCT 29.2* 26.9*  MCV 91.5 90.0  PLT 181 162   Cardiac Enzymes:   No results for input(s): CKTOTAL, CKMB, CKMBINDEX, TROPONINI in the last 168 hours. BNP (last 3 results) Recent Labs    05/12/18 1603 02/18/19 1639 03/30/19 1046  BNP >4,500.0* 2,601.2* >4,500.0*    ProBNP (last 3 results) No results for input(s): PROBNP in the last 8760 hours.  CBG: No results for input(s): GLUCAP in the last 168 hours.  Recent Results (from the past 240 hour(s))  Respiratory Panel by RT PCR (Flu A&B, Covid) - Nasopharyngeal Swab     Status: None   Collection Time: 03/30/19 11:23 AM   Specimen: Nasopharyngeal Swab  Result Value Ref Range Status   SARS Coronavirus 2 by RT PCR NEGATIVE NEGATIVE Final    Comment: (NOTE) SARS-CoV-2 target nucleic acids are NOT DETECTED. The SARS-CoV-2 RNA is generally detectable in upper respiratoy specimens during the acute phase of infection. The lowest concentration of SARS-CoV-2 viral copies this assay can detect is 131 copies/mL. A negative result does not preclude SARS-Cov-2 infection and should not be used as the sole basis for treatment or other patient  management decisions. A negative result may occur with  improper specimen collection/handling, submission of specimen other than nasopharyngeal swab, presence of viral mutation(s) within the areas targeted by this assay, and inadequate number of viral copies (<131 copies/mL). A negative result must be combined with clinical observations, patient history, and epidemiological information. The expected result is Negative. Fact Sheet for Patients:  https://www.moore.com/ Fact Sheet for Healthcare Providers:  https://www.young.biz/ This test is not yet ap proved or  cleared by the [place] FDA and  has been authorized for detection and/or diagnosis of SARS-CoV-2 by FDA under an Emergency Use Authorization (EUA). This EUA will remain  in effect (meaning this test can be used) for the duration of the COVID-19 declaration under Section 564(b)(1) of the Act, 21 U.S.C. section 360bbb-3(b)(1), unless the authorization is terminated or revoked sooner.    Influenza A by PCR NEGATIVE NEGATIVE Final   Influenza B by PCR NEGATIVE NEGATIVE Final    Comment: (NOTE) The Xpert Xpress SARS-CoV-2/FLU/RSV assay is intended as an aid in  the diagnosis of influenza from Nasopharyngeal swab specimens and  should not be used as a sole basis for treatment. Nasal washings and  aspirates are unacceptable for Xpert Xpress SARS-CoV-2/FLU/RSV  testing. Fact Sheet for Patients: https://www.moore.com/ Fact Sheet for Healthcare Providers: https://www.young.biz/ This test is not yet approved or cleared by the [place] FDA and  has been authorized for detection and/or diagnosis of SARS-CoV-2 by  FDA under an Emergency Use Authorization (EUA). This EUA will remain  in effect (meaning this test can be used) for the duration of the  Covid-19 declaration under Section 564(b)(1) of the Act, 21  U.S.C. section 360bbb-3(b)(1), unless the authorization is  terminated or revoked. Performed at [facility], [address], [place], [place] [address]   Urine culture     Status: Abnormal   Collection Time: 03/30/19  9:38 PM   Specimen: Urine, Random  Result Value Ref Range Status   Specimen Description URINE, RANDOM  Final   Special Requests   Final    NONE Performed at [facility], [address] [place], [place] [address]    Culture >=100,000 COLONIES/mL ESCHERICHIA COLI (A)  Final   Report Status 04/02/2019 FINAL  Final   Organism ID, Bacteria ESCHERICHIA COLI (A)  Final      Susceptibility   Escherichia coli - MIC*     AMPICILLIN 8 SENSITIVE Sensitive     CEFAZOLIN <=4 SENSITIVE Sensitive     CEFTRIAXONE <=0.25 SENSITIVE Sensitive     CIPROFLOXACIN <=0.25 SENSITIVE Sensitive     GENTAMICIN <=1 SENSITIVE Sensitive     IMIPENEM <=0.25 SENSITIVE Sensitive     NITROFURANTOIN <=16 SENSITIVE Sensitive     TRIMETH/SULFA <=20 SENSITIVE Sensitive     AMPICILLIN/SULBACTAM 4 SENSITIVE Sensitive     PIP/TAZO <=4 SENSITIVE Sensitive     * >=100,000 COLONIES/mL ESCHERICHIA COLI     Studies: No results found.  Scheduled Meds: . acetaminophen  1,000 mg Oral BID  . aspirin EC  81 mg Oral Daily  . atorvastatin  20 mg Oral Daily  . cephALEXin  500 mg Oral Q12H  . clopidogrel  75 mg Oral Daily  . feeding supplement (ENSURE ENLIVE)  237 mL Oral TID BM  . folic acid  3 mg Oral Daily  . furosemide  80 mg Oral BID  . heparin  5,000 Units Subcutaneous Q12H  . levothyroxine  100 mcg Oral QAC breakfast  . multivitamin with  minerals  1 tablet Oral Daily  . predniSONE  5 mg Oral Daily  . sodium chloride flush  3 mL Intravenous Q12H  . vitamin B-12  1,000 mcg Oral Daily    Continuous Infusions: . sodium chloride 250 mL (04/02/19 1012)     Time spent: I have personally reviewed and interpreted on  04/03/2019 daily labs, tele strips, imagings as discussed above under date review session and assessment and plans.  I reviewed all nursing notes, pharmacy notes, consultant notes,  vitals, pertinent old records  I have discussed plan of care as described above with RN , patient  on 04/03/2019   Albertine Grates MD, PhD, FACP  [facility]  [email or web address],  TR[facility]1 04/03/2019, 4:38 PM  LOS: 4 days

## 2019-04-03 NOTE — Progress Notes (Signed)
Nutrition Follow-up  RD working remotely.  DOCUMENTATION CODES:   Not applicable  INTERVENTION:   -Increase Ensure Enlive po TID, each supplement provides 350 kcal and 20 grams of protein -Continue MVI with minerals daily  NUTRITION DIAGNOSIS:   Increased nutrient needs related to chronic illness(CHF) as evidenced by estimated needs.  Ongoing  GOAL:   Patient will meet greater than or equal to 90% of their needs  Progressing   MONITOR:   PO intake, Supplement acceptance, Labs, Weight trends, Skin, I & O's  REASON FOR ASSESSMENT:   Low [place]    ASSESSMENT:   Hunter Choi is a 86 y.o. male with medical history significant of CAD, s/p TARV, CHF, CVA, hypothyroidism who presents to ER for worsening dyspnea for one week. Patient is accompanied by his son, [name], who is his full time caretaker.  Patient is hard-hearing and thus most of the history provided by patient's son.  The son noted over the past 7 days pt has ad several lbs weight gain too. Son increased home Lasix 40 mg qd to 40 mg BID for 3 days without significant improvement. Has noted increased edema to LE bilaterally. Patient aslo had decreased appetite recently and some generalized weakness.  Denies any chest pain no fever chills no cough, no nauseous vomiting no diarrhea.  No abdominal pain.  Reviewed I/O's: -720 ml x 24 hours and -931 ml since admission  UOP: 1.2 L x 24 hours   Attempted to speak with pt via phone, however, no answer.   Pt's intake has declined since last visit. Noted meal completion now 15-75%. He is consuming Ensure supplements. Pt really enjoys drinking supplements.   Medications reviewed and include vitamin B-12 and prednisone.   Therapies recommending home health on discharge.   Labs reviewed.  Diet Order:   Diet Order            Diet Heart Room service appropriate? Yes; Fluid consistency: Thin; Fluid restriction: 1800 mL Fluid  Diet effective now              EDUCATION  NEEDS:   Education needs have been addressed  Skin:  Skin Assessment: Reviewed RN Assessment  Last BM:  Unknown  Height:   Ht Readings from Last 1 Encounters:  03/30/19 5\' 7"  (1.702 m)    Weight:   Wt Readings from Last 1 Encounters:  04/03/19 70.6 kg    Ideal Body Weight:  67.3 kg  BMI:  Body mass index is 24.38 kg/m.  Estimated Nutritional Needs:   Kcal:  1750-1950  Protein:  85-100 grams  Fluid:  1.8 L    Ian Castagna A. 06/01/19, RD, LDN, CDCES Registered Dietitian II Certified Diabetes Care and Education Specialist Pager: [phone] After hours Pager: [phone]

## 2019-04-04 ENCOUNTER — Inpatient Hospital Stay (HOSPITAL_COMMUNITY): Payer: Medicare Other

## 2019-04-04 LAB — HEPATIC FUNCTION PANEL
ALT: 83 U/L — ABNORMAL HIGH (ref 0–44)
AST: 77 U/L — ABNORMAL HIGH (ref 15–41)
Albumin: 3.1 g/dL — ABNORMAL LOW (ref 3.5–5.0)
Alkaline Phosphatase: 161 U/L — ABNORMAL HIGH (ref 38–126)
Bilirubin, Direct: 0.2 mg/dL (ref 0.0–0.2)
Indirect Bilirubin: 0.1 mg/dL — ABNORMAL LOW (ref 0.3–0.9)
Total Bilirubin: 0.3 mg/dL (ref 0.3–1.2)
Total Protein: 6.6 g/dL (ref 6.5–8.1)

## 2019-04-04 LAB — BASIC METABOLIC PANEL
Anion gap: 16 — ABNORMAL HIGH (ref 5–15)
BUN: 95 mg/dL — ABNORMAL HIGH (ref 8–23)
CO2: 30 mmol/L (ref 22–32)
Calcium: 8.8 mg/dL — ABNORMAL LOW (ref 8.9–10.3)
Chloride: 99 mmol/L (ref 98–111)
Creatinine, Ser: 1.98 mg/dL — ABNORMAL HIGH (ref 0.61–1.24)
GFR calc Af Amer: 34 mL/min — ABNORMAL LOW (ref >60)
GFR calc non Af Amer: 29 mL/min — ABNORMAL LOW (ref >60)
Glucose, Bld: 115 mg/dL — ABNORMAL HIGH (ref 70–99)
Potassium: 4.2 mmol/L (ref 3.5–5.1)
Sodium: 145 mmol/L (ref 135–145)

## 2019-04-04 LAB — CBC WITH DIFFERENTIAL/PLATELET
Abs Immature Granulocytes: 0.09 10*3/uL — ABNORMAL HIGH (ref 0.00–0.07)
Basophils Absolute: 0 10*3/uL (ref 0.0–0.1)
Basophils Relative: 0 %
Eosinophils Absolute: 0.2 10*3/uL (ref 0.0–0.5)
Eosinophils Relative: 2 %
HCT: 29.7 % — ABNORMAL LOW (ref 39.0–52.0)
Hemoglobin: 8.6 g/dL — ABNORMAL LOW (ref 13.0–17.0)
Immature Granulocytes: 1 %
Lymphocytes Relative: 37 %
Lymphs Abs: 3.3 10*3/uL (ref 0.7–4.0)
MCH: 26.1 pg (ref 26.0–34.0)
MCHC: 29 g/dL — ABNORMAL LOW (ref 30.0–36.0)
MCV: 90.3 fL (ref 80.0–100.0)
Monocytes Absolute: 0.7 10*3/uL (ref 0.1–1.0)
Monocytes Relative: 8 %
Neutro Abs: 4.7 10*3/uL (ref 1.7–7.7)
Neutrophils Relative %: 52 %
Platelets: 172 10*3/uL (ref 150–400)
RBC: 3.29 MIL/uL — ABNORMAL LOW (ref 4.22–5.81)
RDW: 18.1 % — ABNORMAL HIGH (ref 11.5–15.5)
WBC: 9 10*3/uL (ref 4.0–10.5)
nRBC: 1.2 % — ABNORMAL HIGH (ref 0.0–0.2)

## 2019-04-04 LAB — MAGNESIUM: Magnesium: 2.5 mg/dL — ABNORMAL HIGH (ref 1.7–2.4)

## 2019-04-04 LAB — AMMONIA: Ammonia: 21 umol/L (ref 9–35)

## 2019-04-04 MED ORDER — RESOURCE THICKENUP CLEAR PO POWD
ORAL | Status: DC | PRN
  Filled 2019-04-04: qty 125

## 2019-04-04 NOTE — Progress Notes (Signed)
Physical Therapy Treatment Patient Details Name: Hunter Choi MRN: [id number] DOB: 1927/10/11 Today's Date: 04/04/2019    History of Present Illness Pt is an 86 y.o. male admitted 03/30/19 with SOB, weight gain and AMS progressively over 2 weeks. (+) Ecoli. CXR with volume overload; worked up for acute on chronic CHF. PMH includes CAD s/p CABG, CHF, aortic stenosis s/p aortic valve replacement, AFIB, AAA, RA on chronic methotrexate and prednisone, CVA with L eye deficits.   PT Comments    Pt slowly progressing with mobility. Able to tolerate multiple sit<>stand and pivot transfers, difficulty clearing feet to take complete steps, requiring modA to maintain standing balance (+2 assist helpful for standing ADL task/pericare). SpO2 86-91% on RA; 2L O2 [place] returned at end of session. Pt seems happy to be out of bed, stating, "I need to get home soon... H-O-M-E." Pt continuing to spell words throughout session which was not the case on Tuesday's treatment session.     Follow Up Recommendations  Home health PT;Supervision/Assistance - 24 hour     Equipment Recommendations  None recommended by PT    Recommendations for Other Services       Precautions / Restrictions Precautions Precautions: Fall Precaution Comments: Very HOH Restrictions Weight Bearing Restrictions: No    Mobility  Bed Mobility Overal bed mobility: Needs Assistance Bed Mobility: Supine to Sit     Supine to sit: Mod assist;HOB elevated     General bed mobility comments: pulled up on therapist's hand, assisted to pivot hips to EOB using bed pad  Transfers Overall transfer level: Needs assistance Equipment used: 2 person hand held assist Transfers: Sit to/from Stand Sit to Stand: Min assist;Mod assist;+2 safety/equipment   Squat pivot transfers: Mod assist;+2 safety/equipment     General transfer comment: Pt requiring min-modA to stand, increased assist needed with fatigue; pt quick to fatigue and sits without  warning, forward flexed posture requiring max cues to achieve (almost) upright standing; 4x sit<>stand from various surface heights; frequent cues for sequencing  Ambulation/Gait                 Stairs             Wheelchair Mobility    Modified Rankin (Stroke Patients Only)       Balance Overall balance assessment: Needs assistance   Sitting balance-Leahy Scale: Fair       Standing balance-Leahy Scale: Poor Standing balance comment: UE support and external assist                            Cognition Arousal/Alertness: Awake/alert Behavior During Therapy: WFL for tasks assessed/performed Overall Cognitive Status: No family/caregiver present to determine baseline cognitive functioning                                 General Comments: pt spelling words throughout session, needing multimodal cues for following commands; apparent cognitive deficits likely exacerbated by very [facility]      Exercises      General Comments General comments (skin integrity, edema, etc.): SpO2 reading 86-91% on RA, 2L O2 [place] returned at end of session; RN approved of keeping soft mitts off as pt not pulling at lines      Pertinent Vitals/Pain Pain Assessment: Faces Faces Pain Scale: No hurt Pain Intervention(s): Monitored during session    Home Living  Prior Function            PT Goals (current goals can now be found in the care plan section) Acute Rehab PT Goals Patient Stated Goal: "I need to get back home" PT Goal Formulation: With patient Time For Goal Achievement: 04/16/19 Progress towards PT goals: Progressing toward goals    Frequency    Min 3X/week      PT Plan Current plan remains appropriate    Co-evaluation PT/OT/SLP Co-Evaluation/Treatment: Yes Reason for Co-Treatment: Necessary to address cognition/behavior during functional activity;For patient/therapist safety;To address functional/ADL  transfers PT goals addressed during session: Mobility/safety with mobility OT goals addressed during session: ADL's and self-care      AM-PAC PT "6 Clicks" Mobility   Outcome Measure  Help needed turning from your back to your side while in a flat bed without using bedrails?: A Little Help needed moving from lying on your back to sitting on the side of a flat bed without using bedrails?: A Lot Help needed moving to and from a bed to a chair (including a wheelchair)?: A Lot Help needed standing up from a chair using your arms (e.g., wheelchair or bedside chair)?: A Lot Help needed to walk in hospital room?: A Lot Help needed climbing 3-5 steps with a railing? : Total 6 Click Score: 12    End of Session   Activity Tolerance: Patient tolerated treatment well Patient left: in chair;with call bell/phone within reach;with chair alarm set Nurse Communication: Mobility status PT Visit Diagnosis: Other abnormalities of gait and mobility (R26.89);Muscle weakness (generalized) (M62.81)     Time: 2330-0762 PT Time Calculation (min) (ACUTE ONLY): 28 min  Charges:  $Therapeutic Activity: 8-22 mins                    Ina Homes, PT, DPT Acute Rehabilitation Services  Pager [phone] Office [phone]  Malachy Chamber 04/04/2019, 1:30 PM

## 2019-04-04 NOTE — Progress Notes (Signed)
Occupational Therapy Treatment Patient Details Name: Hunter Choi MRN: [id number] DOB: 1927/06/24 Today's Date: 04/04/2019    History of present illness Pt is an 86 y.o. male admitted 03/30/19 with SOB, weight gain and AMS progressively over 2 weeks. (+) Ecoli. CXR with volume overload; worked up for acute on chronic CHF. PMH includes CAD s/p CABG, CHF, aortic stenosis s/p aortic valve replacement, AFIB, AAA, RA on chronic methotrexate and prednisone, CVA with L eye deficits.   OT comments  Focus of session on OOB to chair, transfer to [facility] with +2 assist and seated ADL. Pt fatigues easily in standing during pericare. He needs hand over hand assist for oral care, min assist for wiping his mouth and max assist to comb his hair. He is dependent in UB dressing. Pt positioned in chair with alarm, RN approved leaving mitts off.  Follow Up Recommendations  Home health OT;Supervision/Assistance - 24 hour    Equipment Recommendations  None recommended by OT    Recommendations for Other Services      Precautions / Restrictions Precautions Precautions: Fall Precaution Comments: Very HOH       Mobility Bed Mobility Overal bed mobility: Needs Assistance Bed Mobility: Supine to Sit     Supine to sit: Mod assist     General bed mobility comments: pulled up on therapist's hand, assisted to pivot hips to EOB using bed pad  Transfers Overall transfer level: Needs assistance Equipment used: 2 person hand held assist Transfers: Sit to/from [employer] Sit to Stand: Mod assist;+2 safety/equipment;Min assist   Squat pivot transfers: Mod assist;+2 safety/equipment     General transfer comment: assist to rise and steady, pt fatigues quickly in standing, flexed posture and unable to stand fully upright, performed transfers x 4    Balance Overall balance assessment: Needs assistance   Sitting balance-Leahy Scale: Fair       Standing balance-Leahy Scale: Poor                              ADL either performed or assessed with clinical judgement   ADL Overall ADL's : Needs assistance/impaired     Grooming: Brushing hair;Oral care;Wash/dry face;Maximal assistance;Sitting;Minimal assistance Grooming Details (indicate cue type and reason): hand over hand assist to brush teeth, deferred combing hair as he reports his son does this for him, wiped mouth with physical cue to initiate         Upper Body Dressing : Maximal assistance;Sitting       Toilet Transfer: +2 for physical assistance;Moderate assistance;Stand-pivot;BSC   Toileting- Clothing Manipulation and Hygiene: Total assistance               Vision       Perception     Praxis      Cognition Arousal/Alertness: Awake/alert Behavior During Therapy: WFL for tasks assessed/performed Overall Cognitive Status: No family/caregiver present to determine baseline cognitive functioning                                 General Comments: pt spelling words throughout session, needing multimodal cues for following commands        Exercises     Shoulder Instructions       General Comments      Pertinent Vitals/ Pain       Pain Assessment: Faces Faces Pain Scale: No hurt  Home Living  Prior Functioning/Environment              Frequency  Min 2X/week        Progress Toward Goals  OT Goals(current goals can now be found in the care plan section)  Progress towards OT goals: Progressing toward goals  Acute Rehab OT Goals Patient Stated Goal: to go home H-O-M-E OT Goal Formulation: With patient Time For Goal Achievement: 04/16/19 Potential to Achieve Goals: Good  Plan Discharge plan remains appropriate    Co-evaluation    PT/OT/SLP Co-Evaluation/Treatment: Yes Reason for Co-Treatment: For patient/therapist safety   OT goals addressed during session: ADL's and self-care      AM-PAC  OT "6 Clicks" Daily Activity     Outcome Measure   Help from another person eating meals?: A Lot Help from another person taking care of personal grooming?: A Lot Help from another person toileting, which includes using toliet, bedpan, or urinal?: Total Help from another person bathing (including washing, rinsing, drying)?: A Lot Help from another person to put on and taking off regular upper body clothing?: A Lot Help from another person to put on and taking off regular lower body clothing?: Total 6 Click Score: 10    End of Session Equipment Utilized During Treatment: Gait belt  OT Visit Diagnosis: Unsteadiness on feet (R26.81);Other abnormalities of gait and mobility (R26.89);Muscle weakness (generalized) (M62.81)   Activity Tolerance Patient tolerated treatment well   Patient Left in chair;with call bell/phone within reach;with chair alarm set;with nursing/sitter in room(02 applied)   Nurse Communication Other (comment)(ok to leave off mitts)        Time: 2423-5361 OT Time Calculation (min): 28 min  Charges: OT General Charges $OT Visit: 1 Visit OT Treatments $Self Care/Home Management : 8-22 mins  Martie Round, OTR/L Acute Rehabilitation Services Pager: [phone] Office: [phone]   Evern Bio 04/04/2019, 12:38 PM

## 2019-04-04 NOTE — Progress Notes (Addendum)
PROGRESS NOTE  Hunter Choi TDD:[id number] DOB: 02/12/1928 DOA: 03/30/2019 PCP: [name], MD    86 year old male with history ofCAD/CABG, diastolic congestive heart failure, aortic stenosiss/paortic valve replacement, paroxysmal atrial fib, AAA, rheumatoid arthritis on chronic methotrexate and prednisone, CVA with left eye deficits  Presented to ED01/02/21withcomplaints of shortness of breath and weight gain and altered mental status, ongoing about 2 weeks, failed to respond to outpatient escalation in diuretic therapy with Lasix, patient's son is primary caretaker.  ER course:volume overload versus pneumonia on chest x-ray, more likely volume overload. Elevated troponins were trending down, EKG was not particularly concerning. Crreduced from baseline.  cardiology was consulted, advised against heparin drip elevated troponin is most likely due to demand ischemia/decompensated heart failure and in the setting of renal insufficiency, okay to leave on aspirin and Plavix and treat for CHF exacerbation Lasix 80 mg IV twice daily.   Echocardiogram01/02/21: EF 25 to 30%, severely decreased left ventricular function, grade 3 diastolic dysfunction, right ventricle moderately reduced systolic function and moderate enlargement, moderate to severe mitral valve regurg,dilatedIVC~NOsignificant change from prior study.      HPI/Recap of past 24 hours:  He is confused, not following command, he kept removing oxygen supplement, o2 drops to 87% when he is not on o2 supplement He is seen by speech, per speech "pt with primary esophageal dysphagia with backflow to pharynx, increasing aspiration risk. Recommend Dys 2 diet with nectar thick liquids via cup (NO straws), crushed meds. Also recommend palliative care consult"   Assessment/Plan: Active Problems:   CHF (congestive heart failure) (HCC)  Acute on chronic systolic heart failure, acute hypoxic respiratory  failure. -Echocardiogram reveals LVEF 25 to 30% with grade 3 diastolic dysfunction and global hypokinesis. Also has moderate RV dysfunction -on lasix 20mg  bid at home, currently he is on lasix 80mg  bid -per chart review , he was on coreg and cozaar at home, currently, he is not on these per cardiology recommendation, please referto cards note from 1/3 -overall negative 1.6 liters since admission, continue diuresis, remain oxygen dependent, will repeat cxr -Cardiology recommended palliative care consult  Transaminitis Likely from liver congestion from heart failure exacerbation repeat LFT remain elevated but trending down  ammonia level unremarkable   Nonsustained V. Tach, 6 beats on telemetry -He was on Coreg prior to hospitalization ,I do not see Coreg on his current med list -We will start low-dose Lopressor due to borderline blood pressure, plan to transition back to low dose coreg if bp is able to tolerate, case  discussed with cardiology - potassium 4.2, magnesium 2.5 this am -Keep on telemetry  CAD status post CABG -CAD status post CABG with plan for medical therapy given poor revascularization options (occluded grafts). -on asa, plavix and statin  Other chronic cardiac issues:Status post TAVR, prosthetic function normal by echocardiogram with mild perivalvular leak,Moderate to severe mitral regurgitation  H/o left internal carotid artery occlusion with patent right carotid artery,  H/o ABDOMINAL AORTIC ANEURYSM AND BILATERAL COMMON ILIAC ARTERY ANEURYSMS Followed by vascular surgery Dr  Acute on chronic renal failure with CKD stage IIIbat baseline./Ecoli UTI -he was treated with rocephin on 1/4 and 1/5, he is started on kefelx from 1/6 -cr peaked at 2.74, improving ,  Today is 1.98 - renal dosing meds,  renal Edilia Bo no hydronephrosis , does show medical renal disease -urine output 1.8liter documented last 24hrs, will get bladder scan qshiftx3 ( renal ultrasound  mentioned bladder distension)   Normocytic anemia, likely anemia of chronic  disease hgb range from 8-9, close to baseline FOBT ordered, pending collection  Encephalopathy/delirium? Per son ,patient at baseline is "alert, talkative, happy" "but he tends to become confused when he is in the hospital.  This has happened a couple of times." Frequent reorientation  Per son he is more confused than usual, will order ct head.  RA chronically immunosuppressed on methotrexate and prednisone at home Methotrexate held due to abnormal liver function  Hypothyroidism:  Continue synthroid  FTT , appear to have good family supports   Per PT recommendation" Home health PT;Supervision/Assistance - 24 hour (as long as family able to continue providing 24/7 assist)" Previous attending Dr Lyn Hollingshead has goals of care discussion on 1/5, family confirmed full code, details please refer to Dr Mardelle Matte note on 1/5  I think patient and family will benefit from meeting with palliative care for heart failure palliative care supports, formal consult placed. Patient and family will likely benefit from community palliative care service at discharge as well  Speech recommend "pt with primary esophageal dysphagia with backflow to pharynx, increasing aspiration risk. Recommend Dys 2 diet with nectar thick liquids via cup (NO straws), crushed meds. Also recommend palliative care consult   DVT Prophylaxis:heparin  Code Status: full  Family Communication: patient   Disposition Plan: remain very confused, from hypoxia? Repeat cxr,  consider get ct head if patient is able to cooperate with exam Will need palliative care input  Consultants:  Cardiology  Palliative care  Procedures:  none  Antibiotics:  Rocephin then keflex   Objective: BP 116/67 (BP Location: Right Arm)   Pulse 76   Temp 97.6 F (36.4 C) (Oral)   Resp 18   Ht 5\' 7"  (1.702 m)   Wt 68.8 kg   SpO2 99%   BMI 23.76 kg/m    Intake/Output Summary (Last 24 hours) at 04/04/2019 1047 Last data filed at 04/04/2019 1007 Gross per 24 hour  Intake 920 ml  Output 1875 ml  Net -955 ml   Filed Weights   04/02/19 0439 04/03/19 0510 04/04/19 0600  Weight: 70.8 kg 70.6 kg 68.8 kg    Exam: Patient is examined daily including today on 04/04/2019, exams remain the same as of yesterday except that has changed    General:  Confused, not following commands, bilateral mittens on  Cardiovascular: RRR  Respiratory: CTABL  Abdomen: Soft/ND/NT, positive BS  Musculoskeletal: No Edema  Neuro: confused, moving all extremities  Data Reviewed: Basic Metabolic Panel: Recent Labs  Lab 03/31/19 0813 04/01/19 0646 04/02/19 0504 04/03/19 0900 04/04/19 0545  NA 138 137 139 139 145  K 4.5 4.7 4.3 4.2 4.2  CL 102 100 101 100 99  CO2 22 24 26 26 30   GLUCOSE 79 99 117* 103* 115*  BUN 86* 96* 96* 95* 95*  CREATININE 2.45* 2.74* 2.35* 2.05* 1.98*  CALCIUM 8.6* 8.3* 8.6* 8.4* 8.8*  MG  --   --   --   --  2.5*   Liver Function Tests: Recent Labs  Lab 03/30/19 1046 04/04/19 0545  AST 161* 77*  ALT 105* 83*  ALKPHOS 107 161*  BILITOT 0.9 0.3  PROT 6.7 6.6  ALBUMIN 3.4* 3.1*   Recent Labs  Lab 03/30/19 1046  LIPASE 36   Recent Labs  Lab 04/04/19 0545  AMMONIA 21   CBC: Recent Labs  Lab 03/30/19 1046 04/01/19 0646 04/04/19 0545  WBC 9.9 8.8 9.0  NEUTROABS 5.4  --  4.7  HGB 8.6* 8.0* 8.6*  HCT  29.2* 26.9* 29.7*  MCV 91.5 90.0 90.3  PLT 181 162 172   Cardiac Enzymes:   No results for input(s): CKTOTAL, CKMB, CKMBINDEX, TROPONINI in the last 168 hours. BNP (last 3 results) Recent Labs    05/12/18 1603 02/18/19 1639 03/30/19 1046  BNP >4,500.0* 2,601.2* >4,500.0*    ProBNP (last 3 results) No results for input(s): PROBNP in the last 8760 hours.  CBG: No results for input(s): GLUCAP in the last 168 hours.  Recent Results (from the past 240 hour(s))  Respiratory Panel by RT PCR (Flu A&B,  Covid) - Nasopharyngeal Swab     Status: None   Collection Time: 03/30/19 11:23 AM   Specimen: Nasopharyngeal Swab  Result Value Ref Range Status   SARS Coronavirus 2 by RT PCR NEGATIVE NEGATIVE Final    Comment: (NOTE) SARS-CoV-2 target nucleic acids are NOT DETECTED. The SARS-CoV-2 RNA is generally detectable in upper respiratoy specimens during the acute phase of infection. The lowest concentration of SARS-CoV-2 viral copies this assay can detect is 131 copies/mL. A negative result does not preclude SARS-Cov-2 infection and should not be used as the sole basis for treatment or other patient management decisions. A negative result may occur with  improper specimen collection/handling, submission of specimen other than nasopharyngeal swab, presence of viral mutation(s) within the areas targeted by this assay, and inadequate number of viral copies (<131 copies/mL). A negative result must be combined with clinical observations, patient history, and epidemiological information. The expected result is Negative. Fact Sheet for Patients:  https://www.moore.com/ Fact Sheet for Healthcare Providers:  https://www.young.biz/ This test is not yet ap proved or cleared by the [place] FDA and  has been authorized for detection and/or diagnosis of SARS-CoV-2 by FDA under an Emergency Use Authorization (EUA). This EUA will remain  in effect (meaning this test can be used) for the duration of the COVID-19 declaration under Section 564(b)(1) of the Act, 21 U.S.C. section 360bbb-3(b)(1), unless the authorization is terminated or revoked sooner.    Influenza A by PCR NEGATIVE NEGATIVE Final   Influenza B by PCR NEGATIVE NEGATIVE Final    Comment: (NOTE) The Xpert Xpress SARS-CoV-2/FLU/RSV assay is intended as an aid in  the diagnosis of influenza from Nasopharyngeal swab specimens and  should not be used as a sole basis for treatment. Nasal washings and   aspirates are unacceptable for Xpert Xpress SARS-CoV-2/FLU/RSV  testing. Fact Sheet for Patients: https://www.moore.com/ Fact Sheet for Healthcare Providers: https://www.young.biz/ This test is not yet approved or cleared by the [place] FDA and  has been authorized for detection and/or diagnosis of SARS-CoV-2 by  FDA under an Emergency Use Authorization (EUA). This EUA will remain  in effect (meaning this test can be used) for the duration of the  Covid-19 declaration under Section 564(b)(1) of the Act, 21  U.S.C. section 360bbb-3(b)(1), unless the authorization is  terminated or revoked. Performed at [facility], [address], [place], [place] [address]   Urine culture     Status: Abnormal   Collection Time: 03/30/19  9:38 PM   Specimen: Urine, Random  Result Value Ref Range Status   Specimen Description URINE, RANDOM  Final   Special Requests   Final    NONE Performed at [facility], [address] [place], [place] [address]    Culture >=100,000 COLONIES/mL ESCHERICHIA COLI (A)  Final   Report Status 04/02/2019 FINAL  Final   Organism ID, Bacteria ESCHERICHIA COLI (A)  Final  Susceptibility   Escherichia coli - MIC*    AMPICILLIN 8 SENSITIVE Sensitive     CEFAZOLIN <=4 SENSITIVE Sensitive     CEFTRIAXONE <=0.25 SENSITIVE Sensitive     CIPROFLOXACIN <=0.25 SENSITIVE Sensitive     GENTAMICIN <=1 SENSITIVE Sensitive     IMIPENEM <=0.25 SENSITIVE Sensitive     NITROFURANTOIN <=16 SENSITIVE Sensitive     TRIMETH/SULFA <=20 SENSITIVE Sensitive     AMPICILLIN/SULBACTAM 4 SENSITIVE Sensitive     PIP/TAZO <=4 SENSITIVE Sensitive     * >=100,000 COLONIES/mL ESCHERICHIA COLI     Studies: US RENAL  Result Date: 04/03/2019 CLINICAL DATA:  Elevated creatinine EXAM: RENAL / URINARY TRACT ULTRASOUND COMPLETE COMPARISON:  None. FINDINGS: Right Kidney: Renal measurements: 7.5 x 3.6 x 2.4 cm. = volume: 33.2 mL. Mild  increased echogenicity is noted. No hydronephrosis is seen. Left Kidney: Renal measurements: 6.0 x 3.6 x 2.7 cm = volume: 30 mL. Mild increased echogenicity is noted. No hydronephrosis is noted. Bladder: Appears normal for degree of bladder distention. Other: None. IMPRESSION: Changes consistent with medical renal disease and decreased renal size. No obstructive changes are seen. Electronically Signed   By: Inez Catalina M.D.   On: 04/03/2019 22:56    Scheduled Meds: . acetaminophen  1,000 mg Oral BID  . aspirin EC  81 mg Oral Daily  . atorvastatin  20 mg Oral Daily  . cephALEXin  500 mg Oral Q12H  . clopidogrel  75 mg Oral Daily  . feeding supplement (ENSURE ENLIVE)  237 mL Oral TID BM  . folic acid  3 mg Oral Daily  . furosemide  80 mg Oral BID  . heparin  5,000 Units Subcutaneous Q12H  . levothyroxine  100 mcg Oral QAC breakfast  . metoprolol tartrate  12.5 mg Oral BID  . multivitamin with minerals  1 tablet Oral Daily  . predniSONE  5 mg Oral Daily  . sodium chloride flush  3 mL Intravenous Q12H  . vitamin B-12  1,000 mcg Oral Daily    Continuous Infusions: . sodium chloride 250 mL (04/02/19 1012)     Time spent: 51mins I have personally reviewed and interpreted on  04/04/2019 daily labs, tele strips, imagings as discussed above under date review session and assessment and plans.  I reviewed all nursing notes, pharmacy notes, consultant notes,  vitals, pertinent old records  I have discussed plan of care as described above with RN , patient  on 04/04/2019   Florencia Reasons MD, PhD, FACP  [facility]  [email or web address],  TR[facility]1 04/04/2019, 10:47 AM  LOS: 5 days

## 2019-04-04 NOTE — Progress Notes (Signed)
Pt's SpO2 drop down to 87% at rest on room air with room air O2 trial. Had to put 2L back on the patient.

## 2019-04-04 NOTE — Evaluation (Addendum)
Clinical/Bedside Swallow Evaluation Patient Details  Name: Hunter Choi MRN: [id number] Date of Birth: [birth year]  Today's Date: 04/04/2019 Time: SLP Start Time (ACUTE ONLY): 0915 SLP Stop Time (ACUTE ONLY): 0935 SLP Time Calculation (min) (ACUTE ONLY): 20 min  Past Medical History:  Past Medical History:  Diagnosis Date  . AAA (abdominal aortic aneurysm) (HCC)    Dr. Ashley Royalty  . Aortic stenosis, severe    a. s/p TAVR  . Blind left eye   . BPH (benign prostatic hypertrophy)    Dr. Retta Diones  . CAD (coronary artery disease) of bypass graft    Chronic occlusion of saphenous vein graft to RCA  . Coronary artery disease    Left main disease and severe 3-vessel CAD  . Diastolic heart failure (HCC)   . Embolism involving retinal artery   . GERD (gastroesophageal reflux disease)   . Hard of hearing   . Hypertension   . Hypothyroidism   . Iliac artery aneurysm (HCC)    Dr. Edilia Bo  . PAF (paroxysmal atrial fibrillation) (HCC)   . 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 12/2012: mm Stephannie Peters XT transcatheter heart valve placed via transapical approach  . Stroke [facility])    mini stroke effective his eye left   Past Surgical History:  Past Surgical History:  Procedure Laterality Date  . BIOPSY PROSTATE  2005  . BUNIONECTOMY WITH HAMMERTOE RECONSTRUCTION Bilateral   . CARDIAC CATHETERIZATION    . CARDIAC CATHETERIZATION N/A 05/07/2015   Procedure: Right Heart Cath and Coronary/Graft Angiography;  Surgeon: Lyn Records, MD;  Location: [facility] INVASIVE CV LAB;  Service: Cardiovascular;  Laterality: N/A;  . CAROTID ENDARTERECTOMY Right Jan. 2, 1997  . CATARACT EXTRACTION W/ INTRAOCULAR LENS IMPLANT Right   . CHOLECYSTECTOMY N/A 01/28/2015   Procedure: LAPAROSCOPIC CHOLECYSTECTOMY WITH INTRAOPERATIVE CHOLANGIOGRAM;  Surgeon: Gaynelle Adu, MD;  Location: [facility] OR;  Service: General;  Laterality: N/A;  . COLONOSCOPY    . CORONARY ARTERY BYPASS GRAFT  01/1998   Dr.  Sheliah Plane; LIMA to LAD, SVG to D1, SVG to LCx, SVG to RCA, open saphenous vein harvest via right lower extremity  . HARVEST BONE GRAFT  1982   FOR THE  ANKLE  . HEMIARTHROPLASTY HIP Left    AFTER HIP FX  . INTRAOPERATIVE TRANSESOPHAGEAL ECHOCARDIOGRAM N/A 01/15/2013   Procedure: INTRAOPERATIVE TRANSESOPHAGEAL ECHOCARDIOGRAM;  Surgeon: [name], MD;  Location: [facility] OR;  Service: Open Heart Surgery;  Laterality: N/A;  . JOINT REPLACEMENT    . LEFT AND RIGHT HEART CATHETERIZATION WITH CORONARY ANGIOGRAM N/A 12/20/2012   Procedure: LEFT AND RIGHT HEART CATHETERIZATION WITH CORONARY ANGIOGRAM;  Surgeon: Lesleigh Noe, MD;  Location: [facility] CATH LAB;  Service: Cardiovascular;  Laterality: N/A;  . LEFT HEART CATH AND CORS/GRAFTS ANGIOGRAPHY N/A 02/14/2017   Procedure: LEFT HEART CATH AND CORS/GRAFTS ANGIOGRAPHY;  Surgeon: Marykay Lex, MD;  Location: [facility] INVASIVE CV LAB;  Service: Cardiovascular;  Laterality: N/A;  . SKIN GRAFTS  1968  . TOTAL HIP ARTHROPLASTY Left 2007  . TOTAL KNEE ARTHROPLASTY  1992-2002   right-left  . TRANSCATHETER AORTIC VALVE REPLACEMENT, TRANSAPICAL N/A 01/15/2013   Procedure: TRANSCATHETER AORTIC VALVE REPLACEMENT, TRANSAPICAL;  Surgeon: [name], MD;  Location: [facility] OR;  Service: Open Heart Surgery;  Laterality: N/A;  . TRANSURETHRAL RESECTION OF PROSTATE  2012   HPI:  86 y.o. male with medical history significant of CAD, s/p TARV, CHF, CVA, hypothyroidism who presents to ER for worsening dyspnea  for one week.   Assessment / Plan / Recommendation Clinical Impression  Pt is known to SLP services, having had MBS in February 2020. Results suspected esophageal dysphagia, with backflow of material into the pharynx noted. This significantly increases aspiration risk.   Today, pt presents in similar fashion, with reduced mastication of solids, and gurgling sound and belching after the swallow. Intermittent cough after thin liquids was noted, but nectar thick  liquid appeared to be tolerated well. Recommend dys 2 (finely chopped) diet with nectar thick liquids, meds crushed in puree, and adherence to reflux precautions. Safe swallow precautions posted at [facility]. SLP will follow acutely for education. Palliative Care consult is also recommended to facilitate establishment of appropriate goals of care for this pt. RN and MD informed.    SLP Visit Diagnosis: Dysphagia, unspecified (R13.10)    Aspiration Risk  Moderate aspiration risk    Diet Recommendation Dysphagia 2 (Fine chop);Nectar-thick liquid   Liquid Administration via: No straw;Cup Medication Administration: Crushed with puree Supervision: Full supervision/cueing for compensatory strategies Compensations: Minimize environmental distractions;Slow rate;Small sips/bites Postural Changes: Remain upright for at least 30 minutes after po intake;Seated upright at 90 degrees    Other  Recommendations Oral Care Recommendations: Oral care BID Other Recommendations: Order thickener from pharmacy   Follow up Recommendations 24 hour supervision/assistance      Frequency and Duration min 1 x/week  1 week;2 weeks       Prognosis Prognosis for Safe Diet Advancement: Fair Barriers to Reach Goals: Time post onset      Swallow Study   General Date of Onset: 03/30/19 HPI: 85 y.o. male with medical history significant of CAD, s/p TARV, CHF, CVA, hypothyroidism who presents to ER for worsening dyspnea for one week. Previous Swallow Assessment: MBS2/17/2020 rec Dys2, thin via cup, crushed meds. Esophageal backflow Diet Prior to this Study: Regular;Thin liquids Respiratory Status: Nasal cannula History of Recent Intubation: No Behavior/Cognition: Alert;Cooperative;Confused Oral Cavity Assessment: Within Functional Limits Oral Care Completed by SLP: No Oral Cavity - Dentition: Poor condition;Missing dentition Vision: Functional for self-feeding Self-Feeding Abilities: Total assist Patient  Positioning: Upright in bed Baseline Vocal Quality: Normal Volitional Cough: Weak;Congested Volitional Swallow: Unable to elicit    Oral/Motor/Sensory Function Overall Oral Motor/Sensory Function: Generalized oral weakness   Ice Chips Ice chips: Not tested   Thin Liquid Thin Liquid: Impaired Pharyngeal  Phase Impairments: Cough - Immediate    Nectar Thick Nectar Thick Liquid: Within functional limits Presentation: Cup   Honey Thick Honey Thick Liquid: Not tested   Puree Puree: Within functional limits Presentation: Spoon   Solid     Solid: Impaired Oral Phase Impairments: Impaired mastication;Reduced lingual movement/coordination Oral Phase Functional Implications: Oral residue;Impaired mastication     Enriqueta Shutter, [place], [place] Speech Language Pathologist Office: [phone] Pager: [phone]  Shonna Chock 04/04/2019,12:54 PM

## 2019-04-05 DIAGNOSIS — Z7189 Other specified counseling: Secondary | ICD-10-CM

## 2019-04-05 DIAGNOSIS — R7989 Other specified abnormal findings of blood chemistry: Secondary | ICD-10-CM

## 2019-04-05 DIAGNOSIS — Z515 Encounter for palliative care: Secondary | ICD-10-CM

## 2019-04-05 MED ORDER — CARVEDILOL 3.125 MG PO TABS
3.1250 mg | ORAL_TABLET | Freq: Two times a day (BID) | ORAL | Status: DC
  Administered 2019-04-05 – 2019-04-06 (×3): 3.125 mg via ORAL
  Filled 2019-04-05 (×8): qty 1

## 2019-04-05 NOTE — Consult Note (Signed)
Consultation Note Date: 04/05/2019   Patient Name: Hunter Choi  DOB: Aug 18, 1927  MRN: [id number]  Age / Sex: 85 y.o., male  PCP: [name], MD Referring Physician: Albertine Grates, MD  Reason for Consultation: Establishing goals of care  HPI/Patient Profile: 86 y.o. male with past medical history of CAD, CABG, combined CHF with EF 25-30%, aortic stenosis s/p TAVR, paroxysmal atrial fib, AAA, CKD stage IIIb, rheumatoid arthritis on chronic methotrexate and prednisone, CVA  admitted on 03/30/2019 with AMS, weight gain, and shortness of breath. Hospital admission for acute on chronic CHF with EF 25-30%, acute hypoxic respiratory failure, transaminitis likely from liver congestion, acute on chronic renal failure, and ongoing encephalopathy/delirium likely baseline vascular dementia. Cardiology recommending palliative medicine consultation for goals of care.   Clinical Assessment and Goals of Care:  I have reviewed medical records, discussed with care team, and assessed patient. [name] is awake, alert, and oriented to person and place. He is disoriented to time/situation and extremely hard of hearing. He denies pain. No family at bedside.   Spoke with son, [name], via telephone to discuss diagnosis, prognosis, GOC, EOL wishes, disposition and options.  I introduced Palliative Medicine as specialized medical care for people living with serious illness. It focuses on providing relief from the symptoms and stress of a serious illness. The goal is to improve quality of life for both the patient and the family. [name] is familiar with palliative medicine from when his mother was chronically ill.   We discussed a brief life review of the patient. [name] has been his father's primary caregiver for approximately 15 years. Prior to admission, patient requiring minimal assist with ADL's and ambulating with rolling walking.  Cognitively intake. Appetite fair. The patient has two sons.   Discussed events leading up to admission and course of hospitalization including diagnoses, interventions, plan of care. [name] acknowledges his father is in the "end-stages of heart failure" and he may pass "sooner than later." He speaks of recurrent hospitalizations in the last two years. [name] appreciates open, honest discussions with Dr. Lyn Hollingshead and Dr. Roda Shutters.   I attempted to elicit values and goals of care important to the patient and sons. Advanced directives, concepts specific to code status, artifical feeding and hydration, and rehospitalization were considered and discussed. [name] shares that his father does not have a documented living but has always been the "keep me alive type." [name] and I agreed that his father does not have capacity to make medical decisions and would not understand what aggressive medical management would mean for quality of life.   After discussions with providers, [name] has further discussed code status with his brother. [name] and his brother are in agreement that they do not wish for [name] to be resuscitated at the EOL. "We would not want that." [name] also states that "to keep him intubated would be cruel." [name] and his brother wish for comfort, peace, dignity when their father nears EOL.   When [name]'s mother (the patient's spouse) was nearing EOL, decisions were  made for aggressive management including tracheostomy. She requiring nursing home placement and was recurrently hospitalized until she passed. [name] states "the experience will save dad from longer suffering" after what they saw their mother go through.   [name] is interested in completing electronic MOST form with PMT provider. Decisions include: DNR/DNI, limited additional interventions including BiPAP/CPAP if indicated, IVF/ABX if indicated, and feeding tube for time trial. [name] has trouble with signing electronic MOST form on his phone and is agreeable with  two person consent for MOST form. This NP, Vennie Homans and Mariana Arn, PMT RN verified decisions on MOST form. Durable DNR completed. Copies of MOST form and durable DNR left at bedside for son and placed in patient's chart.   [name] is concerned about his father's delirium and his ability to care for him at home in this current state. Reviewed CT scan including negative for acute findings but findings of dementia.   Discussed outpatient palliative versus hospice options and philosophy. At this time, [name] is hopeful for his father to discharge home with home health but interested in palliative referral, understanding palliative services can transition to hospice services at any time.   Questions and concerns were addressed.     SUMMARY OF RECOMMENDATIONS    Electronic MOST form completed with son, [name]. DNR/DNI, limited additional interventions including BiPAP/CPAP and rehospitalization if indicated, IVF/ABX if indicated, and feeding tube for time trial. Durable DNR completed. Copies of MOST and durable DNR left at bedside for son.   Continue medical management and current plan of care per attending.  Continue PT efforts. Son hopeful for discharge home with home health services.   Son interested in outpatient palliative referral. [facility] team notified. Son understands outpatient palliative services can transition to hospice services at any time. Son understands his father is end-stage heart failure with poor long-term prognosis.   Code Status/Advance Care Planning:  DNR  Symptom Management:   Per attending  Palliative Prophylaxis:   Aspiration, Delirium Protocol, Oral Care and Turn Reposition  Additional Recommendations (Limitations, Scope, Preferences):  Full Scope Treatment  Psycho-social/Spiritual:   Desire for further Chaplaincy support: yes  Additional Recommendations: Caregiving  Support/Resources  Prognosis:   Poor long-term prognosis  Discharge Planning: Home with  Home Health: outpatient palliative referral      Primary Diagnoses: Present on Admission: **None**   I have reviewed the medical record, interviewed the patient and family, and examined the patient. The following aspects are pertinent.  Past Medical History:  Diagnosis Date  . AAA (abdominal aortic aneurysm) (HCC)    Dr. Ashley Royalty  . Aortic stenosis, severe    a. s/p TAVR  . Blind left eye   . BPH (benign prostatic hypertrophy)    Dr. Retta Diones  . CAD (coronary artery disease) of bypass graft    Chronic occlusion of saphenous vein graft to RCA  . Coronary artery disease    Left main disease and severe 3-vessel CAD  . Diastolic heart failure (HCC)   . Embolism involving retinal artery   . GERD (gastroesophageal reflux disease)   . Hard of hearing   . Hypertension   . Hypothyroidism   . Iliac artery aneurysm (HCC)    Dr. Edilia Bo  . PAF (paroxysmal atrial fibrillation) (HCC)   . 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 12/2012: mm Stephannie Peters XT transcatheter heart valve placed via transapical approach  . Stroke [facility])    mini stroke effective his eye left   Social History  Socioeconomic History  . Marital status: Married    Spouse name: Not on file  . Number of children: Not on file  . Years of education: Not on file  . Highest education level: Not on file  Occupational History  . Occupation: Retired  Tobacco Use  . Smoking status: Former Smoker    Packs/day: 0.50    Years: 37.00    Pack years: 18.50    Types: Cigarettes    Quit date: 03/29/1963    Years since quitting: 56.0  . Smokeless tobacco: Former Neurosurgeon    Types: Chew  Substance and Sexual Activity  . Alcohol use: No  . Drug use: No  . Sexual activity: Not on file  Other Topics Concern  . Not on file  Social History Narrative  . Not on file   Social Determinants of Health   Financial Resource Strain:   . Difficulty of Paying Living Expenses: Not on file  Food  Insecurity:   . Worried About Programme researcher, broadcasting/film/video in the Last Year: Not on file  . Ran Out of Food in the Last Year: Not on file  Transportation Needs:   . Lack of Transportation (Medical): Not on file  . Lack of Transportation (Non-Medical): Not on file  Physical Activity:   . Days of Exercise per Week: Not on file  . Minutes of Exercise per Session: Not on file  Stress:   . Feeling of Stress : Not on file  Social Connections:   . Frequency of Communication with Friends and Family: Not on file  . Frequency of Social Gatherings with Friends and Family: Not on file  . Attends Religious Services: Not on file  . Active Member of Clubs or Organizations: Not on file  . Attends Banker Meetings: Not on file  . Marital Status: Not on file   Family History  Problem Relation Age of Onset  . Heart attack Mother   . Heart disease Mother        Before age 87  . Hyperlipidemia Mother   . Hypertension Mother   . Hypertension Son   . Heart disease Father        AAA-   . Hyperlipidemia Father   . Heart attack Father   . Hypertension Father   . Heart disease Brother        Before age 74  . Hyperlipidemia Brother   . Heart attack Brother   . Hypertension Brother    Scheduled Meds: . acetaminophen  1,000 mg Oral BID  . aspirin EC  81 mg Oral Daily  . atorvastatin  20 mg Oral Daily  . cephALEXin  500 mg Oral Q12H  . clopidogrel  75 mg Oral Daily  . feeding supplement (ENSURE ENLIVE)  237 mL Oral TID BM  . folic acid  3 mg Oral Daily  . furosemide  80 mg Oral BID  . heparin  5,000 Units Subcutaneous Q12H  . levothyroxine  100 mcg Oral QAC breakfast  . metoprolol tartrate  12.5 mg Oral BID  . multivitamin with minerals  1 tablet Oral Daily  . predniSONE  5 mg Oral Daily  . sodium chloride flush  3 mL Intravenous Q12H  . vitamin B-12  1,000 mcg Oral Daily   Continuous Infusions: . sodium chloride 250 mL (04/02/19 1012)   PRN Meds:.sodium chloride, acetaminophen,  calcium carbonate, fluticasone, guaiFENesin-dextromethorphan, nitroGLYCERIN, ondansetron (ZOFRAN) IV, Resource ThickenUp Clear, sodium chloride flush Medications Prior to Admission:  Prior to  Admission medications   Medication Sig Start Date End Date Taking? Authorizing Provider  acetaminophen (TYLENOL) 500 MG tablet Take 1,000 mg by mouth 2 (two) times daily.   Yes [provider]  aspirin EC 81 MG EC tablet Take 1 tablet (81 mg total) by mouth daily. 01/21/13  Yes Doree Fudge M, PA-C  atorvastatin (LIPITOR) 20 MG tablet Take 20 mg by mouth daily.   Yes [provider]  Calcium Carbonate Antacid (TUMS PO) Take 1 tablet by mouth 2 (two) times daily as needed (heartburn/indigestion).   Yes [provider]  carvedilol (COREG) 6.25 MG tablet Take 1 tablet (6.25 mg total) by mouth 2 (two) times daily with a meal. 10/21/15  Yes Lyn Records, MD  clopidogrel (PLAVIX) 75 MG tablet TAKE 1 TABLET BY MOUTH EVERY DAY Patient taking differently: Take 75 mg by mouth daily.  11/12/18  Yes Leone Brand, NP  feeding supplement, ENSURE ENLIVE, (ENSURE ENLIVE) LIQD Take 237 mLs by mouth 3 (three) times daily between meals. 03/27/18  Yes Dhungel, Nishant, MD  folic acid (FOLVITE) 1 MG tablet Take 3 mg by mouth daily.   Yes [provider]  furosemide (LASIX) 20 MG tablet TAKE 2 TABLETS BY MOUTH EVERY DAY Patient taking differently: Take 40 mg by mouth daily.  01/15/19  Yes Lyn Records, MD  levothyroxine (SYNTHROID, LEVOTHROID) 100 MCG tablet Take 100 mcg by mouth daily before breakfast. 05/09/18  Yes [provider]  losartan (COZAAR) 25 MG tablet Take 25 mg by mouth daily. 03/19/18  Yes [provider]  methotrexate 2.5 MG tablet Take 25 mg by mouth every Friday.    Yes [provider]  Multiple Vitamin (MULTIVITAMIN WITH MINERALS) TABS tablet Take 1 tablet by mouth daily. 03/27/18  Yes Dhungel, Nishant, MD  nitroGLYCERIN (NITROSTAT) 0.4 MG  SL tablet Place 0.4 mg under the tongue every 5 (five) minutes as needed for chest pain.   Yes [provider]  predniSONE (DELTASONE) 5 MG tablet Take 5 mg by mouth daily. 04/07/15  Yes [provider]  vitamin B-12 (CYANOCOBALAMIN) 1000 MCG tablet Take 1 tablet (1,000 mcg total) by mouth daily. 03/27/18  Yes Dhungel, Theda Belfast, MD   Allergies  Allergen Reactions  . Ciprofloxacin Other (See Comments)    REACTION: mild delirium   Review of Systems  Unable to perform ROS: Mental status change   Physical Exam Vitals and nursing note reviewed.  Constitutional:      General: He is awake.     Appearance: He is cachectic. He is ill-appearing.  HENT:     Head: Normocephalic and atraumatic.  Cardiovascular:     Rate and Rhythm: Normal rate.  Pulmonary:     Effort: No tachypnea, accessory muscle usage or respiratory distress.     Breath sounds: Normal breath sounds.  Skin:    General: Skin is warm and dry.     Coloration: Skin is pale.  Neurological:     Mental Status: He is alert.     Comments: Oriented to person and 'hospital.' Otherwise disoriented with pleasant confusion. Extremely hard of hearing.   Psychiatric:        Attention and Perception: He is inattentive.        Speech: Speech is delayed.        Cognition and Memory: Cognition is impaired.     Vital Signs: BP (!) 107/52 (BP Location: Left Arm)   Pulse 67   Temp 99.4 F (37.4 C) (Oral)  Resp (!) 22   Ht 5\' 7"  (1.702 m)   Wt 73.7 kg   SpO2 96%   BMI 25.45 kg/m  Pain Scale: 0-10   Pain Score: 0-No pain   SpO2: SpO2: 96 % O2 Device:SpO2: 96 % O2 Flow Rate: .O2 Flow Rate (L/min): 3.5 L/min  IO: Intake/output summary:   Intake/Output Summary (Last 24 hours) at 04/05/2019 1233 Last data filed at 04/05/2019 0913 Gross per 24 hour  Intake 837 ml  Output 450 ml  Net 387 ml    LBM: Last BM Date: 04/04/19 Baseline Weight: Weight: 68.4 kg Most recent weight: Weight: 73.7 kg     Palliative  Assessment/Data: PPS 40%   Flowsheet Rows     Most Recent Value  Intake Tab  Referral Department  Hospitalist  Unit at Time of Referral  Cardiac/Telemetry Unit  Palliative Care Primary Diagnosis  Cardiac  Date Notified  04/03/19  Palliative Care Type  New Palliative care  Reason Not Seen  Consult cancelled [Attending felt consult no longer needed]  Reason for referral  Clarify Goals of Care  Date first seen by Palliative Care  04/05/19  Clinical Assessment  Palliative Performance Scale Score  40%  Psychosocial & Spiritual Assessment  Palliative Care Outcomes  Patient/Family meeting held?  Yes  Who was at the meeting?  son [name])  [place] goals of care, Counseled regarding hospice, Provided end of life care assistance, Provided advance care planning, Provided psychosocial or spiritual support, Changed CPR status, Completed durable DNR, Linked to palliative care logitudinal support, ACP counseling assistance      Time In: 1100 Time Out: 1230 Time Total: 90 Greater than 50%  of this time was spent counseling and coordinating care related to the above assessment and plan.  Signed by:  Ihor Dow, DNP, FNP-C Palliative Medicine Team  Phone: [phone] Fax: [phone]   Please contact Palliative Medicine Team phone at [phone] for questions and concerns.  For individual provider: See Shea Evans

## 2019-04-05 NOTE — Progress Notes (Signed)
PROGRESS NOTE  Hunter Choi ZTI:[id number] DOB: April 11, 1927 DOA: 03/30/2019 PCP: Leeroy Cha, MD    86 year old male with history ofCAD/CABG, diastolic congestive heart failure, aortic stenosiss/paortic valve replacement, paroxysmal atrial fib, AAA, rheumatoid arthritis on chronic methotrexate and prednisone, CVA with left eye deficits  Presented to ED01/02/21withcomplaints of shortness of breath and weight gain and altered mental status, ongoing about 2 weeks, failed to respond to outpatient escalation in diuretic therapy with Lasix, patient's son is primary caretaker.  ER course:volume overload versus pneumonia on chest x-ray, more likely volume overload. Elevated troponins were trending down, EKG was not particularly concerning. Crreduced from baseline.  cardiology was consulted, advised against heparin drip elevated troponin is most likely due to demand ischemia/decompensated heart failure and in the setting of renal insufficiency, okay to leave on aspirin and Plavix and treat for CHF exacerbation Lasix 80 mg IV twice daily.   Echocardiogram01/02/21: EF 25 to 30%, severely decreased left ventricular function, grade 3 diastolic dysfunction, right ventricle moderately reduced systolic function and moderate enlargement, moderate to severe mitral valve regurg,dilatedIVC~NOsignificant change from prior study.      HPI/Recap of past 24 hours:  Urine output does not appear to be accurately documented He remains oxygen dependent (he is not on home O2 at baseline) Continue to be confused  Assessment/Plan: Active Problems:   CHF (congestive heart failure) (HCC)  Acute on chronic systolic heart failure, acute hypoxic respiratory failure. -Echocardiogram reveals LVEF 25 to 30% with grade 3 diastolic dysfunction and global hypokinesis. Also has moderate RV dysfunction -on lasix 20mg  bid at home, currently he is on lasix 80mg  bid,  -start low dose coreg today  now his blood pressure has improved - continue diuresis, remain oxygen dependent, repeat cxr "1. Improved congestive heart failure with minimal residual pulmonary edema. 2. Tiny right pleural effusion. 3. Chronic cardiomegaly." -Cardiology recommended palliative care consult, family agree with DNR status and community palliative care service , they are not ready for hospice yet.  Transaminitis Likely from liver congestion from heart failure exacerbation repeat LFT remain elevated but trending down  ammonia level unremarkable   Nonsustained V. Tach, 6 beats on telemetry -He was not able to tolerate Coreg initially due to hypotension, now his blood pressure has improved, will restart low-dose Coreg - potassium 4.2, magnesium 2.5 this am -Keep on telemetry  CAD status post CABG -CAD status post CABG with plan for medical therapy given poor revascularization options (occluded grafts). -on coreg, asa, plavix and statin  Other chronic cardiac issues:Status post TAVR, prosthetic function normal by echocardiogram with mild perivalvular leak,Moderate to severe mitral regurgitation  H/o left internal carotid artery occlusion with patent right carotid artery,  H/o ABDOMINAL AORTIC ANEURYSM AND BILATERAL COMMON ILIAC ARTERY ANEURYSMS Followed by vascular surgery Dr Scot Dock  Acute on chronic renal failure with CKD stage IIIbat baseline./Ecoli UTI -he was treated with rocephin on 1/4 and 1/5, he is started on kefelx from 1/6 -cr peaked at 2.74, trending down to 1.98 - renal dosing meds,  renal US no hydronephrosis , does show medical renal disease - bladder scan (PVR) with 73cc    Normocytic anemia, likely anemia of chronic disease hgb range from 8-9, close to baseline FOBT negative   Encephalopathy/delirium?  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  This has happened a couple  of times." Frequent reorientation  Per son he is more confused than  usual,  ct head ordered showed " No acute finding by CT. Advanced generalized brain atrophy. Chronic small-vessel ischemic changes throughout the brain. Old small vessel cerebellar infarctions. Old right parietal cortical infarction. Old small left parietal vertex infarction."  esophageal dysphagia Speech recommend "pt with primary esophageal dysphagia with backflow to pharynx, increasing aspiration risk. Recommend Dys 2 diet with nectar thick liquids via cup (NO straws), crushed meds. Also recommend palliative care consult  RA chronically immunosuppressed on methotrexate and prednisone at home Methotrexate held due to abnormal liver function  Hypothyroidism:  Continue synthroid  FTT , appear to have good family supports   Per PT recommendation" Home health PT;Supervision/Assistance - 24 hour (as long as family able to continue providing 24/7 assist)"  I think patient and family will benefit from meeting with palliative care for heart failure palliative care supports, formal consult placed. Patient and family will likely benefit from community palliative care service at discharge as well Palliative care input appreciated    DVT Prophylaxis:heparin  Code Status: DNR  Family Communication: patient   Disposition Plan: Need to wean O2, home with home health and outpatient palliative care service in 1 to 2 days pending clinical improvement  Consultants:  Cardiology  Palliative care  Procedures:  none  Antibiotics:  Rocephin then keflex   Objective: BP (!) 107/52 (BP Location: Left Arm)   Pulse 67   Temp 99.4 F (37.4 C) (Oral)   Resp (!) 22   Ht 5\' 7"  (1.702 m)   Wt 73.7 kg   SpO2 96%   BMI 25.45 kg/m   Intake/Output Summary (Last 24 hours) at 04/05/2019 1257 Last data filed at 04/05/2019 0913 Gross per 24 hour  Intake 837 ml  Output 450 ml  Net 387 ml   Filed Weights   04/03/19 0510 04/04/19  0600 04/05/19 0550  Weight: 70.6 kg 68.8 kg 73.7 kg    Exam: Patient is examined daily including today on 04/05/2019, exams remain the same as of yesterday except that has changed    General:  Hard of hearing, Confused, not following commands, bilateral mittens on  Cardiovascular: RRR  Respiratory: CTABL  Abdomen: Soft/ND/NT, positive BS  Musculoskeletal: No Edema  Neuro: confused, moving all extremities  Data Reviewed: Basic Metabolic Panel: Recent Labs  Lab 03/31/19 0813 04/01/19 0646 04/02/19 0504 04/03/19 0900 04/04/19 0545  NA 138 137 139 139 145  K 4.5 4.7 4.3 4.2 4.2  CL 102 100 101 100 99  CO2 22 24 26 26 30   GLUCOSE 79 99 117* 103* 115*  BUN 86* 96* 96* 95* 95*  CREATININE 2.45* 2.74* 2.35* 2.05* 1.98*  CALCIUM 8.6* 8.3* 8.6* 8.4* 8.8*  MG  --   --   --   --  2.5*   Liver Function Tests: Recent Labs  Lab 03/30/19 1046 04/04/19 0545  AST 161* 77*  ALT 105* 83*  ALKPHOS 107 161*  BILITOT 0.9 0.3  PROT 6.7 6.6  ALBUMIN 3.4* 3.1*   Recent Labs  Lab 03/30/19 1046  LIPASE 36   Recent Labs  Lab 04/04/19 0545  AMMONIA 21   CBC: Recent Labs  Lab 03/30/19 1046 04/01/19 0646 04/04/19 0545  WBC 9.9 8.8 9.0  NEUTROABS 5.4  --  4.7  HGB 8.6* 8.0* 8.6*  HCT 29.2* 26.9* 29.7*  MCV 91.5 90.0 90.3  PLT 181 162 172   Cardiac Enzymes:   No results for input(s): CKTOTAL, CKMB, CKMBINDEX, TROPONINI in the last 168 hours. BNP (last 3  results) Recent Labs    05/12/18 1603 02/18/19 1639 03/30/19 1046  BNP >4,500.0* 2,601.2* >4,500.0*    ProBNP (last 3 results) No results for input(s): PROBNP in the last 8760 hours.  CBG: No results for input(s): GLUCAP in the last 168 hours.  Recent Results (from the past 240 hour(s))  Respiratory Panel by RT PCR (Flu A&B, Covid) - Nasopharyngeal Swab     Status: None   Collection Time: 03/30/19 11:23 AM   Specimen: Nasopharyngeal Swab  Result Value Ref Range Status   SARS Coronavirus 2 by RT PCR NEGATIVE  NEGATIVE Final    Comment: (NOTE) SARS-CoV-2 target nucleic acids are NOT DETECTED. The SARS-CoV-2 RNA is generally detectable in upper respiratoy specimens during the acute phase of infection. The lowest concentration of SARS-CoV-2 viral copies this assay can detect is 131 copies/mL. A negative result does not preclude SARS-Cov-2 infection and should not be used as the sole basis for treatment or other patient management decisions. A negative result may occur with  improper specimen collection/handling, submission of specimen other than nasopharyngeal swab, presence of viral mutation(s) within the areas targeted by this assay, and inadequate number of viral copies (<131 copies/mL). A negative result must be combined with clinical observations, patient history, and epidemiological information. The expected result is Negative. Fact Sheet for Patients:  https://www.moore.com/ Fact Sheet for Healthcare Providers:  https://www.young.biz/ This test is not yet ap proved or cleared by the [place] FDA and  has been authorized for detection and/or diagnosis of SARS-CoV-2 by FDA under an Emergency Use Authorization (EUA). This EUA will remain  in effect (meaning this test can be used) for the duration of the COVID-19 declaration under Section 564(b)(1) of the Act, 21 U.S.C. section 360bbb-3(b)(1), unless the authorization is terminated or revoked sooner.    Influenza A by PCR NEGATIVE NEGATIVE Final   Influenza B by PCR NEGATIVE NEGATIVE Final    Comment: (NOTE) The Xpert Xpress SARS-CoV-2/FLU/RSV assay is intended as an aid in  the diagnosis of influenza from Nasopharyngeal swab specimens and  should not be used as a sole basis for treatment. Nasal washings and  aspirates are unacceptable for Xpert Xpress SARS-CoV-2/FLU/RSV  testing. Fact Sheet for Patients: https://www.moore.com/ Fact Sheet for Healthcare  Providers: https://www.young.biz/ This test is not yet approved or cleared by the [place] FDA and  has been authorized for detection and/or diagnosis of SARS-CoV-2 by  FDA under an Emergency Use Authorization (EUA). This EUA will remain  in effect (meaning this test can be used) for the duration of the  Covid-19 declaration under Section 564(b)(1) of the Act, 21  U.S.C. section 360bbb-3(b)(1), unless the authorization is  terminated or revoked. Performed at [facility], [address], [place], [place] [address]   Urine culture     Status: Abnormal   Collection Time: 03/30/19  9:38 PM   Specimen: Urine, Random  Result Value Ref Range Status   Specimen Description URINE, RANDOM  Final   Special Requests   Final    NONE Performed at [facility], [address] [place], [place] [address]    Culture >=100,000 COLONIES/mL ESCHERICHIA COLI (A)  Final   Report Status 04/02/2019 FINAL  Final   Organism ID, Bacteria ESCHERICHIA COLI (A)  Final      Susceptibility   Escherichia coli - MIC*    AMPICILLIN 8 SENSITIVE Sensitive     CEFAZOLIN <=4 SENSITIVE Sensitive     CEFTRIAXONE <=0.25 SENSITIVE Sensitive  CIPROFLOXACIN <=0.25 SENSITIVE Sensitive     GENTAMICIN <=1 SENSITIVE Sensitive     IMIPENEM <=0.25 SENSITIVE Sensitive     NITROFURANTOIN <=16 SENSITIVE Sensitive     TRIMETH/SULFA <=20 SENSITIVE Sensitive     AMPICILLIN/SULBACTAM 4 SENSITIVE Sensitive     PIP/TAZO <=4 SENSITIVE Sensitive     * >=100,000 COLONIES/mL ESCHERICHIA COLI     Studies: CT HEAD WO CONTRAST  Result Date: 04/04/2019 CLINICAL DATA:  Altered mental status.  Encephalopathy. EXAM: CT HEAD WITHOUT CONTRAST TECHNIQUE: Contiguous axial images were obtained from the base of the skull through the vertex without intravenous contrast. COMPARISON:  MRI 05/13/2018 FINDINGS: Brain: Generalized atrophy. Chronic small-vessel ischemic changes of the pons. Old small vessel infarction  affecting both sides of the cerebellum. Cerebral hemispheres show advanced confluent chronic small vessel ischemic changes of the white matter. There is an old right parietal cortical infarction posteriorly. Small left parietal cortical infarction at the vertex. No mass lesion, hemorrhage, hydrocephalus or extra-axial collection. Vascular: There is atherosclerotic calcification of the major vessels at the base of the brain. Skull: Normal Sinuses/Orbits: Clear/normal Other: None IMPRESSION: No acute finding by CT. Advanced generalized brain atrophy. Chronic small-vessel ischemic changes throughout the brain. Old small vessel cerebellar infarctions. Old right parietal cortical infarction. Old small left parietal vertex infarction. These were both present in February of 2020. Electronically Signed   By: Paulina Fusi M.D.   On: 04/04/2019 19:14    Scheduled Meds: . acetaminophen  1,000 mg Oral BID  . aspirin EC  81 mg Oral Daily  . atorvastatin  20 mg Oral Daily  . cephALEXin  500 mg Oral Q12H  . clopidogrel  75 mg Oral Daily  . feeding supplement (ENSURE ENLIVE)  237 mL Oral TID BM  . folic acid  3 mg Oral Daily  . furosemide  80 mg Oral BID  . heparin  5,000 Units Subcutaneous Q12H  . levothyroxine  100 mcg Oral QAC breakfast  . metoprolol tartrate  12.5 mg Oral BID  . multivitamin with minerals  1 tablet Oral Daily  . predniSONE  5 mg Oral Daily  . sodium chloride flush  3 mL Intravenous Q12H  . vitamin B-12  1,000 mcg Oral Daily    Continuous Infusions: . sodium chloride 250 mL (04/02/19 1012)     Time spent: I have personally reviewed and interpreted on  04/05/2019 daily labs, tele strips, imagings as discussed above under date review session and assessment and plans.  I reviewed all nursing notes, pharmacy notes, consultant notes,  vitals, pertinent old records  I have discussed plan of care as described above with RN , patient  on 04/05/2019   Albertine Grates MD, PhD, FACP  [facility]  [email or web address],  TR[facility]1 04/05/2019, 12:57 PM  LOS: 6 days

## 2019-04-05 NOTE — Progress Notes (Signed)
Tried to put him on room air for a while at rest, patient's SpO2 went down to 80's. Had to put 2L oxygen back on the patient.

## 2019-04-05 NOTE — TOC Initial Note (Signed)
Transition of Care [facility]) - Initial/Assessment Note    Patient Details  Name: Hunter Choi MRN: [id number] Date of Birth: 07-Mar-1928  [facility] CM/SW Contact:    [name], RN Phone Number: 04/05/2019, 4:44 PM  Clinical Narrative:                 NCM spoke with son, he states patient  Lives with him. NCM offered choice for [facility], he states they have used [facility] and would like to use them.  Referral given to Tiffany with [facility].  Patient also has a palliative consult.  Tiffany with [facility] cannot give a definite answer as to if they will be able to take referral until we know more from palliative consult. Await Palliative recs.  Expected Discharge Plan: Home w Home Health Services Barriers to Discharge: No Barriers Identified   Patient Goals and CMS Choice     Choice offered to / list presented to : Adult Children  Expected Discharge Plan and Services Expected Discharge Plan: Home w Home Health Services   Discharge Planning Services: CM Consult Post Acute Care Choice: Home Health Living arrangements for the past 2 months: Single Family Home                 DME Arranged: (NA)         HH Arranged: RN, Disease Management HH Agency: [facility] Date HH Agency Contacted: 04/05/19 Time HH Agency Contacted: 1644 Representative spoke with at [facility] Agency: Tiffany  Prior Living Arrangements/Services Living arrangements for the past 2 months: Single Family Home Lives with:: Adult Children Patient language and need for interpreter reviewed:: Yes        Need for Family Participation in Patient Care: Yes (Comment)     Criminal Activity/Legal Involvement Pertinent to Current Situation/Hospitalization: No - Comment as needed  Activities of Daily Living Home Assistive Devices/Equipment: None ADL Screening (condition at time of admission) Patient's cognitive ability adequate to safely complete daily activities?: No Is the patient  deaf or have difficulty hearing?: Yes Does the patient have difficulty seeing, even when wearing glasses/contacts?: No Does the patient have difficulty concentrating, remembering, or making decisions?: Yes Patient able to express need for assistance with ADLs?: Yes Does the patient have difficulty dressing or bathing?: No Independently performs ADLs?: Yes (appropriate for developmental age) Communication: Independent Dressing (OT): Needs assistance Is this a change from baseline?: Pre-admission baseline In/Out Bed: Needs assistance Is this a change from baseline?: Pre-admission baseline Walks in Home: Needs assistance Is this a change from baseline?: Pre-admission baseline Does the patient have difficulty walking or climbing stairs?: Yes Weakness of Legs: Both Weakness of Arms/Hands: None  Permission Sought/Granted                  Emotional Assessment              Admission diagnosis:  Shortness of breath [R06.02] CHF (congestive heart failure) (HCC) [I50.9] Elevated troponin [R77.8] Short of breath on exertion [R06.02] Acute on chronic congestive heart failure, unspecified heart failure type (HCC) [I50.9] Patient Active Problem List   Diagnosis Date Noted  . CHF (congestive heart failure) (HCC) 03/30/2019  . Shortness of breath   . Acute exacerbation of CHF (congestive heart failure) (HCC) 05/12/2018  . Nausea vomiting and diarrhea 04/22/2018  . B12 deficiency 03/27/2018  . Hypothyroidism 03/27/2018  . Chronic combined systolic and diastolic CHF (congestive heart failure) (HCC) 03/27/2018  . Protein-calorie malnutrition, severe  03/24/2018  . Generalized weakness   . Acute metabolic encephalopathy 66/08/3014  . CKD (chronic kidney disease) stage 3, GFR 30-59 ml/min 12/09/2017  . SIRS (systemic inflammatory response syndrome) ([place]) 08/03/2017  . Essential hypertension 08/03/2017  . Elevated troponin   . Sepsis due to urinary tract infection ([place]) 10/02/2015  .  Hard of hearing   . Pressure ulcer 01/28/2015  . Acute on chronic combined systolic and diastolic congestive heart failure, NYHA class 4 ([place]) 01/27/2015  . Hyponatremia 11/21/2014  . Elevated LFTs 11/14/2014  . Anemia 05/23/2014  . Thrombocytopenia ([place]) 02/21/2014  . Malnutrition of moderate degree ([place]) 02/21/2014  . Occlusion and stenosis of carotid artery without mention of cerebral infarction 09/04/2013  . Aftercare following surgery of the circulatory system, [place] 09/04/2013  . AAA (abdominal aortic aneurysm) without rupture ([place]) 03/06/2013  . 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 Coronary artery disease involving coronary bypass graft of native heart with angina pectoris ([place])   . GERD (gastroesophageal reflux disease)   . Thyroid disease   . Embolism involving retinal artery   . Iliac artery aneurysm ([place])   . Rheumatoid arthritis ([place]) 04/22/2010   PCP:  Leeroy Cha, MD Pharmacy:   [facility] ([place]) [place], [place] [address] Phone: [phone] Fax: [phone]  [id number] [place], [place] [place] [place] [address] Phone: [phone] Fax: [phone]     Social Determinants of Health ([place]) Interventions    Readmission Risk Interventions Readmission Risk Prevention Plan 04/05/2019  Transportation Screening Complete  Medication Review ([place]) Complete  PCP or Specialist appointment within 3-5 days of discharge Complete  HRI or [place] Complete  SW Recovery Care/Counseling Consult Complete  [place] Not Applicable  Some recent data might be hidden

## 2019-04-05 NOTE — Progress Notes (Signed)
Palliative Medicine RN Note: Received request from Vennie Homans, PMT NP, to witness phone conversation regarding MOST form and family wishes. I was on the phone with Aundra Millet and Delana Meyer, and we reviewed the MOST form that was completed via phone today.  Margret Chance Skylor Schnapp, RN, BSN, [facility] Palliative Medicine Team 04/05/2019 12:09 PM Office [phone]

## 2019-04-05 NOTE — Progress Notes (Signed)
  Speech Language Pathology Treatment: Dysphagia  Patient Details Name: Hunter Choi MRN: [id number] DOB: [birth year] Today's Date: 04/05/2019 Time: 9983-3825 SLP Time Calculation (min) (ACUTE ONLY): 11 min  Assessment / Plan / Recommendation Clinical Impression  Pt seen at bedside for skilled ST intervention targeting goals for diet tolerance and education. Pt was seen during lunch today, with Dys 2 solids and nectar thick liquids. Pt required 1:1 assist due to bilateral mitts in place. Adequate oral prep and clearing noted. Intermittent cough noted following trials of nectar thick liquid via cup sip. Pt has a history of esophageal dysphagia, which increases risk of aspiration after the swallow.   No family present at this time to educate, and pt is unable to demonstrate retention of information provided. Will continue efforts to follow up for education when family is present, after family meeting with Palliative care to establish goals of care.   HPI HPI: 86 y.o. male with medical history significant of CAD, s/p TARV, CHF, CVA, hypothyroidism who presents to ER for worsening dyspnea for one week.      SLP Plan  Continue with current plan of care       Recommendations  Diet recommendations: Dysphagia 2 (fine chop);Nectar-thick liquid Liquids provided via: Cup;No straw Medication Administration: Crushed with puree Supervision: Full supervision/cueing for compensatory strategies;Trained caregiver to feed patient Compensations: Minimize environmental distractions;Slow rate;Small sips/bites Postural Changes and/or Swallow Maneuvers: Seated upright 90 degrees;Upright 30-60 min after meal                Oral Care Recommendations: Oral care BID Follow up Recommendations: 24 hour supervision/assistance SLP Visit Diagnosis: Dysphagia, unspecified (R13.10) Plan: Continue with current plan of care       GO              Hunter Choi, [facility], CCC-SLP Speech Language Pathologist Office:  [phone] Pager: [phone]  Leigh Aurora 04/05/2019, 2:13 PM

## 2019-04-06 ENCOUNTER — Inpatient Hospital Stay (HOSPITAL_COMMUNITY): Payer: Medicare Other

## 2019-04-06 LAB — CBC WITH DIFFERENTIAL/PLATELET
Abs Immature Granulocytes: 0.12 10*3/uL — ABNORMAL HIGH (ref 0.00–0.07)
Basophils Absolute: 0.1 10*3/uL (ref 0.0–0.1)
Basophils Relative: 0 %
Eosinophils Absolute: 0.2 10*3/uL (ref 0.0–0.5)
Eosinophils Relative: 1 %
HCT: 30.7 % — ABNORMAL LOW (ref 39.0–52.0)
Hemoglobin: 9 g/dL — ABNORMAL LOW (ref 13.0–17.0)
Immature Granulocytes: 1 %
Lymphocytes Relative: 35 %
Lymphs Abs: 3.9 10*3/uL (ref 0.7–4.0)
MCH: 26.4 pg (ref 26.0–34.0)
MCHC: 29.3 g/dL — ABNORMAL LOW (ref 30.0–36.0)
MCV: 90 fL (ref 80.0–100.0)
Monocytes Absolute: 0.9 10*3/uL (ref 0.1–1.0)
Monocytes Relative: 8 %
Neutro Abs: 6.2 10*3/uL (ref 1.7–7.7)
Neutrophils Relative %: 55 %
Platelets: 179 10*3/uL (ref 150–400)
RBC: 3.41 MIL/uL — ABNORMAL LOW (ref 4.22–5.81)
RDW: 18.2 % — ABNORMAL HIGH (ref 11.5–15.5)
WBC: 11.1 10*3/uL — ABNORMAL HIGH (ref 4.0–10.5)
nRBC: 1 % — ABNORMAL HIGH (ref 0.0–0.2)

## 2019-04-06 LAB — COMPREHENSIVE METABOLIC PANEL
ALT: 59 U/L — ABNORMAL HIGH (ref 0–44)
AST: 58 U/L — ABNORMAL HIGH (ref 15–41)
Albumin: 3.1 g/dL — ABNORMAL LOW (ref 3.5–5.0)
Alkaline Phosphatase: 120 U/L (ref 38–126)
Anion gap: 15 (ref 5–15)
BUN: 98 mg/dL — ABNORMAL HIGH (ref 8–23)
CO2: 33 mmol/L — ABNORMAL HIGH (ref 22–32)
Calcium: 9.2 mg/dL (ref 8.9–10.3)
Chloride: 101 mmol/L (ref 98–111)
Creatinine, Ser: 2.15 mg/dL — ABNORMAL HIGH (ref 0.61–1.24)
GFR calc Af Amer: 31 mL/min — ABNORMAL LOW (ref >60)
GFR calc non Af Amer: 26 mL/min — ABNORMAL LOW (ref >60)
Glucose, Bld: 119 mg/dL — ABNORMAL HIGH (ref 70–99)
Potassium: 4.4 mmol/L (ref 3.5–5.1)
Sodium: 149 mmol/L — ABNORMAL HIGH (ref 135–145)
Total Bilirubin: 0.6 mg/dL (ref 0.3–1.2)
Total Protein: 6.8 g/dL (ref 6.5–8.1)

## 2019-04-06 NOTE — Progress Notes (Addendum)
PROGRESS NOTE  Hunter Choi NAT:[id number] DOB: Oct 10, 1927 DOA: 03/30/2019 PCP: [name], MD    86 year old male with history ofCAD/CABG, diastolic congestive heart failure, aortic stenosiss/paortic valve replacement, paroxysmal atrial fib, AAA, rheumatoid arthritis on chronic methotrexate and prednisone, CVA with left eye deficits  Presented to ED01/02/21withcomplaints of shortness of breath and weight gain and altered mental status, ongoing about 2 weeks, failed to respond to outpatient escalation in diuretic therapy with Lasix, patient's son is primary caretaker.  ER course:volume overload versus pneumonia on chest x-ray, more likely volume overload. Elevated troponins were trending down, EKG was not particularly concerning. Crreduced from baseline.  cardiology was consulted, advised against heparin drip elevated troponin is most likely due to demand ischemia/decompensated heart failure and in the setting of renal insufficiency, okay to leave on aspirin and Plavix and treat for CHF exacerbation Lasix 80 mg IV twice daily.   Echocardiogram01/02/21: EF 25 to 30%, severely decreased left ventricular function, grade 3 diastolic dysfunction, right ventricle moderately reduced systolic function and moderate enlargement, moderate to severe mitral valve regurg,dilatedIVC~NOsignificant change from prior study.      HPI/Recap of past 24 hours:  1950 cc urine output documented last 24 hours, he remains oxygen dependent (baseline he is not oxygen dependent), appear less confused this morning  Assessment/Plan: Active Problems:   Short of breath on exertion   CHF (congestive heart failure) (HCC)   Palliative care by specialist   Goals of care, counseling/discussion   Creatinine elevation  Acute on chronic systolic heart failure, acute hypoxic respiratory failure. -Echocardiogram reveals LVEF 25 to 30% with grade 3 diastolic dysfunction and global  hypokinesis. Also has moderate RV dysfunction -Cardiology recommended palliative care consult, family agree with DNR status and community palliative care service , they are not ready for hospice yet.-- -he is started back on low dose coreg on 1/8  When his blood pressure has improved -he was on lasix 20mg  bid at home, he has been on lasix 80mg  bid since 1/5,  repeat cxr on 1/7 "1. Improved congestive heart failure with minimal residual pulmonary edema. 2. Tiny right pleural effusion. 3. Chronic cardiomegaly." -He appeared dry today, with slightly elevated creatinine and sodium this morning, will hold Lasix today, repeat BMP in the morning   Transaminitis Likely from liver congestion from heart failure exacerbation repeat LFT remain elevated but trending down  ammonia level unremarkable   Nonsustained V. Tach,  -He was not able to tolerate Coreg initially due to hypotension, now his blood pressure has improved, restarted on  low-dose Coreg on 1/8 -Keep K greater than 4 , mag greater than 2 -Stable, discontinue telemetry  CAD status post CABG -CAD status post CABG with plan for medical therapy given poor revascularization options (occluded grafts). -on coreg, asa, plavix and statin  Other chronic cardiac issues:Status post TAVR, prosthetic function normal by echocardiogram with mild perivalvular leak,Moderate to severe mitral regurgitation  H/o left internal carotid artery occlusion with patent right carotid artery,  H/o ABDOMINAL AORTIC ANEURYSM AND BILATERAL COMMON ILIAC ARTERY ANEURYSMS Followed by vascular surgery Dr 3/7  Acute on chronic renal failure with CKD stage IIIbat baseline./Ecoli UTI -he was treated with rocephin on 1/4 and 1/5, he is started on kefelx from 1/6 -cr peaked at 2.74, trending down to 1.98 - renal dosing meds,  renal Edilia Bo no hydronephrosis , does show medical renal disease - bladder scan (PVR) with 73cc    Normocytic anemia, likely anemia of  chronic disease hgb range from 8-9,  close to baseline FOBT negative   Encephalopathy/delirium?  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  This has happened a couple of times." Frequent reorientation  Per son he is more confused than usual,  ct head ordered showed " No acute finding by CT. Advanced generalized brain atrophy. Chronic small-vessel ischemic changes throughout the brain. Old small vessel cerebellar infarctions. Old right parietal cortical infarction. Old small left parietal vertex infarction."  esophageal dysphagia Speech recommend "pt with primary esophageal dysphagia with backflow to pharynx, increasing aspiration risk. Recommend Dys 2 diet with nectar thick liquids via cup (NO straws), crushed meds. Also recommend palliative care consult  6:40pm addendum RN reports noticed increased coughing spells with oral intake, patient did ok with meds, will keep npo except sips with meds, elevate head of bed, continue aspiration precaution, repeat cxr. Repeat swallow eval.  RA chronically immunosuppressed on methotrexate and prednisone at home Methotrexate held due to abnormal liver function  Hypothyroidism:  Continue synthroid  FTT , appear to have good family supports   Per PT recommendation" Home health PT;Supervision/Assistance - 24 hour (as long as family able to continue providing 24/7 assist)"  I think patient and family will benefit from meeting with palliative care for heart failure palliative care supports, formal consult placed. Patient and family will likely benefit from community palliative care service at discharge as well Palliative care input appreciated    DVT Prophylaxis:heparin  Code Status: DNR  Family Communication: patient and son over the phone  Disposition Plan: Need to wean O2, repeat cr, sodium, home with home health and outpatient palliative  care service in 1 to 2 days pending clinical improvement  Consultants:  Cardiology  Palliative care  Procedures:  none  Antibiotics:  Rocephin then keflex   Objective: BP 110/65   Pulse 72   Temp 98.7 F (37.1 C) (Oral)   Resp 18   Ht 5\' 7"  (1.702 m)   Wt 73.7 kg   SpO2 92%   BMI 25.45 kg/m   Intake/Output Summary (Last 24 hours) at 04/06/2019 1145 Last data filed at 04/06/2019 0900 Gross per 24 hour  Intake 357 ml  Output 1500 ml  Net -1143 ml   Filed Weights   04/03/19 0510 04/04/19 0600 04/05/19 0550  Weight: 70.6 kg 68.8 kg 73.7 kg    Exam: Patient is examined daily including today on 04/06/2019, exams remain the same as of yesterday except that has changed    General:  Hard of hearing, Confused, not following commands, bilateral mittens on  Cardiovascular: RRR  Respiratory: CTABL  Abdomen: Soft/ND/NT, positive BS  Musculoskeletal: No Edema  Neuro: confused, moving all extremities  Data Reviewed: Basic Metabolic Panel: Recent Labs  Lab 04/01/19 0646 04/02/19 0504 04/03/19 0900 04/04/19 0545 04/06/19 0309  NA 137 139 139 145 149*  K 4.7 4.3 4.2 4.2 4.4  CL 100 101 100 99 101  CO2 24 26 26 30  33*  GLUCOSE 99 117* 103* 115* 119*  BUN 96* 96* 95* 95* 98*  CREATININE 2.74* 2.35* 2.05* 1.98* 2.15*  CALCIUM 8.3* 8.6* 8.4* 8.8* 9.2  MG  --   --   --  2.5*  --    Liver Function Tests: Recent Labs  Lab 04/04/19 0545 04/06/19 0309  AST 77* 58*  ALT 83* 59*  ALKPHOS 161* 120  BILITOT 0.3 0.6  PROT 6.6 6.8  ALBUMIN 3.1* 3.1*  No results for input(s): LIPASE, AMYLASE in the last 168 hours. Recent Labs  Lab 04/04/19 0545  AMMONIA 21   CBC: Recent Labs  Lab 04/01/19 0646 04/04/19 0545 04/06/19 0309  WBC 8.8 9.0 11.1*  NEUTROABS  --  4.7 6.2  HGB 8.0* 8.6* 9.0*  HCT 26.9* 29.7* 30.7*  MCV 90.0 90.3 90.0  PLT 162 172 179   Cardiac Enzymes:   No results for input(s): CKTOTAL, CKMB, CKMBINDEX, TROPONINI in the last 168 hours. BNP  (last 3 results) Recent Labs    05/12/18 1603 02/18/19 1639 03/30/19 1046  BNP >4,500.0* 2,601.2* >4,500.0*    ProBNP (last 3 results) No results for input(s): PROBNP in the last 8760 hours.  CBG: No results for input(s): GLUCAP in the last 168 hours.  Recent Results (from the past 240 hour(s))  Respiratory Panel by RT PCR (Flu A&B, Covid) - Nasopharyngeal Swab     Status: None   Collection Time: 03/30/19 11:23 AM   Specimen: Nasopharyngeal Swab  Result Value Ref Range Status   SARS Coronavirus 2 by RT PCR NEGATIVE NEGATIVE Final    Comment: (NOTE) SARS-CoV-2 target nucleic acids are NOT DETECTED. The SARS-CoV-2 RNA is generally detectable in upper respiratoy specimens during the acute phase of infection. The lowest concentration of SARS-CoV-2 viral copies this assay can detect is 131 copies/mL. A negative result does not preclude SARS-Cov-2 infection and should not be used as the sole basis for treatment or other patient management decisions. A negative result may occur with  improper specimen collection/handling, submission of specimen other than nasopharyngeal swab, presence of viral mutation(s) within the areas targeted by this assay, and inadequate number of viral copies (<131 copies/mL). A negative result must be combined with clinical observations, patient history, and epidemiological information. The expected result is Negative. Fact Sheet for Patients:  PinkCheek.be Fact Sheet for Healthcare Providers:  GravelBags.it This test is not yet ap proved or cleared by the [place] FDA and  has been authorized for detection and/or diagnosis of SARS-CoV-2 by FDA under an Emergency Use Authorization (EUA). This EUA will remain  in effect (meaning this test can be used) for the duration of the COVID-19 declaration under Section 564(b)(1) of the Act, 21 U.S.C. section 360bbb-3(b)(1), unless the authorization is  terminated or revoked sooner.    Influenza A by PCR NEGATIVE NEGATIVE Final   Influenza B by PCR NEGATIVE NEGATIVE Final    Comment: (NOTE) The Xpert Xpress SARS-CoV-2/FLU/RSV assay is intended as an aid in  the diagnosis of influenza from Nasopharyngeal swab specimens and  should not be used as a sole basis for treatment. Nasal washings and  aspirates are unacceptable for Xpert Xpress SARS-CoV-2/FLU/RSV  testing. Fact Sheet for Patients: PinkCheek.be Fact Sheet for Healthcare Providers: GravelBags.it This test is not yet approved or cleared by the [place] FDA and  has been authorized for detection and/or diagnosis of SARS-CoV-2 by  FDA under an Emergency Use Authorization (EUA). This EUA will remain  in effect (meaning this test can be used) for the duration of the  Covid-19 declaration under Section 564(b)(1) of the Act, 21  U.S.C. section 360bbb-3(b)(1), unless the authorization is  terminated or revoked. Performed at [facility], [place] [address] [place], [place] [address]   Urine culture     Status: Abnormal   Collection Time: 03/30/19  9:38 PM   Specimen: Urine, Random  Result Value Ref Range Status   Specimen Description URINE, RANDOM  Final  Special Requests   Final    NONE Performed at [facility], [address], [place], [place] [address]    Culture >=100,000 COLONIES/mL ESCHERICHIA COLI (A)  Final   Report Status 04/02/2019 FINAL  Final   Organism ID, Bacteria ESCHERICHIA COLI (A)  Final      Susceptibility   Escherichia coli - MIC*    AMPICILLIN 8 SENSITIVE Sensitive     CEFAZOLIN <=4 SENSITIVE Sensitive     CEFTRIAXONE <=0.25 SENSITIVE Sensitive     CIPROFLOXACIN <=0.25 SENSITIVE Sensitive     GENTAMICIN <=1 SENSITIVE Sensitive     IMIPENEM <=0.25 SENSITIVE Sensitive     NITROFURANTOIN <=16 SENSITIVE Sensitive     TRIMETH/SULFA <=20 SENSITIVE Sensitive     AMPICILLIN/SULBACTAM 4  SENSITIVE Sensitive     PIP/TAZO <=4 SENSITIVE Sensitive     * >=100,000 COLONIES/mL ESCHERICHIA COLI     Studies: No results found.  Scheduled Meds: . acetaminophen  1,000 mg Oral BID  . aspirin EC  81 mg Oral Daily  . atorvastatin  20 mg Oral Daily  . carvedilol  3.125 mg Oral BID WC  . cephALEXin  500 mg Oral Q12H  . clopidogrel  75 mg Oral Daily  . feeding supplement (ENSURE ENLIVE)  237 mL Oral TID BM  . folic acid  3 mg Oral Daily  . heparin  5,000 Units Subcutaneous Q12H  . levothyroxine  100 mcg Oral QAC breakfast  . multivitamin with minerals  1 tablet Oral Daily  . predniSONE  5 mg Oral Daily  . sodium chloride flush  3 mL Intravenous Q12H  . vitamin B-12  1,000 mcg Oral Daily    Continuous Infusions: . sodium chloride 250 mL (04/02/19 1012)     Time spent: I have personally reviewed and interpreted on  04/06/2019 daily labs, tele strips, imagings as discussed above under date review session and assessment and plans.  I reviewed all nursing notes, pharmacy notes, consultant notes,  vitals, pertinent old records  I have discussed plan of care as described above with RN , patient  And son on 04/06/2019   Albertine Grates MD, PhD, FACP  [facility]  [email or web address],  TR[facility] 04/06/2019, 11:45 AM  LOS: 7 days

## 2019-04-06 NOTE — Progress Notes (Addendum)
Patient frequently takes oxygen nasal cannula off despite using soft mittens to discourage the action. Patient seems to be maintaining oxygen saturation above 90%.  Patient has had a strong cough throughout the day and while assisting with dinner it was difficulty to assess if the patient was appropriately clearing his food and drink. MD notified and will wait to discuss before continuing to offer PO intake  After speaking with MD will make patient NPO for now and she will repeat chest xray

## 2019-04-07 LAB — CBC WITH DIFFERENTIAL/PLATELET
Abs Immature Granulocytes: 0.06 10*3/uL (ref 0.00–0.07)
Basophils Absolute: 0.1 10*3/uL (ref 0.0–0.1)
Basophils Relative: 1 %
Eosinophils Absolute: 0.2 10*3/uL (ref 0.0–0.5)
Eosinophils Relative: 2 %
HCT: 32.5 % — ABNORMAL LOW (ref 39.0–52.0)
Hemoglobin: 9.2 g/dL — ABNORMAL LOW (ref 13.0–17.0)
Immature Granulocytes: 1 %
Lymphocytes Relative: 38 %
Lymphs Abs: 4.4 10*3/uL — ABNORMAL HIGH (ref 0.7–4.0)
MCH: 25.7 pg — ABNORMAL LOW (ref 26.0–34.0)
MCHC: 28.3 g/dL — ABNORMAL LOW (ref 30.0–36.0)
MCV: 90.8 fL (ref 80.0–100.0)
Monocytes Absolute: 0.9 10*3/uL (ref 0.1–1.0)
Monocytes Relative: 8 %
Neutro Abs: 6 10*3/uL (ref 1.7–7.7)
Neutrophils Relative %: 50 %
Platelets: 204 10*3/uL (ref 150–400)
RBC: 3.58 MIL/uL — ABNORMAL LOW (ref 4.22–5.81)
RDW: 18.5 % — ABNORMAL HIGH (ref 11.5–15.5)
WBC: 11.5 10*3/uL — ABNORMAL HIGH (ref 4.0–10.5)
nRBC: 1.2 % — ABNORMAL HIGH (ref 0.0–0.2)

## 2019-04-07 LAB — COMPREHENSIVE METABOLIC PANEL
ALT: 52 U/L — ABNORMAL HIGH (ref 0–44)
AST: 68 U/L — ABNORMAL HIGH (ref 15–41)
Albumin: 3.2 g/dL — ABNORMAL LOW (ref 3.5–5.0)
Alkaline Phosphatase: 121 U/L (ref 38–126)
Anion gap: 10 (ref 5–15)
BUN: 110 mg/dL — ABNORMAL HIGH (ref 8–23)
CO2: 34 mmol/L — ABNORMAL HIGH (ref 22–32)
Calcium: 9.2 mg/dL (ref 8.9–10.3)
Chloride: 105 mmol/L (ref 98–111)
Creatinine, Ser: 2.52 mg/dL — ABNORMAL HIGH (ref 0.61–1.24)
GFR calc Af Amer: 25 mL/min — ABNORMAL LOW (ref >60)
GFR calc non Af Amer: 22 mL/min — ABNORMAL LOW (ref >60)
Glucose, Bld: 104 mg/dL — ABNORMAL HIGH (ref 70–99)
Potassium: 5.1 mmol/L (ref 3.5–5.1)
Sodium: 149 mmol/L — ABNORMAL HIGH (ref 135–145)
Total Bilirubin: 1.2 mg/dL (ref 0.3–1.2)
Total Protein: 7.3 g/dL (ref 6.5–8.1)

## 2019-04-07 NOTE — Progress Notes (Signed)
PROGRESS NOTE  Hunter Choi ASN:[id number] DOB: 1928/01/15 DOA: 03/30/2019 PCP: [name], MD    86 year old male with history ofCAD/CABG, diastolic congestive heart failure, aortic stenosiss/paortic valve replacement, paroxysmal atrial fib, AAA, rheumatoid arthritis on chronic methotrexate and prednisone, CVA with left eye deficits  Presented to ED01/02/21withcomplaints of shortness of breath and weight gain and altered mental status, ongoing about 2 weeks, failed to respond to outpatient escalation in diuretic therapy with Lasix, patient's son is primary caretaker.  ER course:volume overload versus pneumonia on chest x-ray, more likely volume overload. Elevated troponins were trending down, EKG was not particularly concerning. Crreduced from baseline.  cardiology was consulted, advised against heparin drip elevated troponin is most likely due to demand ischemia/decompensated heart failure and in the setting of renal insufficiency, okay to leave on aspirin and Plavix and treat for CHF exacerbation Lasix 80 mg IV twice daily.   Echocardiogram01/02/21: EF 25 to 30%, severely decreased left ventricular function, grade 3 diastolic dysfunction, right ventricle moderately reduced systolic function and moderate enlargement, moderate to severe mitral valve regurg,dilatedIVC~NOsignificant change from prior study.      HPI/Recap of past 24 hours:  Remain confused , cr trend up, sodium 149, repeat cxr got worse He is npo due to cough with oral intake, awaiting for repeat swallow eval  Assessment/Plan: Active Problems:   Short of breath on exertion   CHF (congestive heart failure) (HCC)   Palliative care by specialist   Goals of care, counseling/discussion   Creatinine elevation  Acute on chronic systolic heart failure, acute hypoxic respiratory failure. -Echocardiogram reveals LVEF 25 to 30% with grade 3 diastolic dysfunction and global hypokinesis. Also  has moderate RV dysfunction -currently on coreg 3.125 with holding parameters, lasix held due to worsening of cr, he is likely developing cardiorenal syndrome, repeat chest x-ray with persistent vascular congestion worsening since prior study -Cardiology recommended palliative care consult, I discussed with son over the phone today, son is open to hospice care if patient does not improve in the next 1 to 2 days    Transaminitis Likely from liver congestion from heart failure exacerbation repeat LFT remain elevated but trending down  ammonia level unremarkable   Nonsustained V. Tach,  -He was not able to tolerate Coreg initially due to hypotension, now his blood pressure has improved, restarted on  low-dose Coreg on 1/8   CAD status post CABG -CAD status post CABG with plan for medical therapy given poor revascularization options (occluded grafts). -on coreg, asa, plavix and statin  Other chronic cardiac issues:Status post TAVR, prosthetic function normal by echocardiogram with mild perivalvular leak,Moderate to severe mitral regurgitation  H/o left internal carotid artery occlusion with patent right carotid artery,  H/o ABDOMINAL AORTIC ANEURYSM AND BILATERAL COMMON ILIAC ARTERY ANEURYSMS Followed by vascular surgery Dr Edilia Bo  Acute on chronic renal failure with CKD stage IIIbat baseline./Ecoli UTI -he was treated with rocephin on 1/4 and 1/5, he is started on kefelx from 1/6 -cr peaked at 2.74, trending down to 1.98, then trend up again - renal dosing meds,   -renal US no hydronephrosis , does show medical renal disease - bladder scan (PVR) with 73cc    Normocytic anemia, likely anemia of chronic disease hgb range from 8-9, close to baseline FOBT negative   Encephalopathy/delirium?  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  This has happened a couple of  times."  Frequent reorientation  Per son he is more confused than usual,  ct head ordered showed " No acute finding by CT. Advanced generalized brain atrophy. Chronic small-vessel ischemic changes throughout the brain. Old small vessel cerebellar infarctions. Old right parietal cortical infarction. Old small left parietal vertex infarction."  esophageal dysphagia Speech recommend "pt with primary esophageal dysphagia with backflow to pharynx, increasing aspiration risk. Recommend Dys 2 diet with nectar thick liquids via cup (NO straws), crushed meds. Also recommend palliative care consult   RN reports noticed increased coughing spells with oral intake, patient did ok with meds, will keep npo except sips with meds, elevate head of bed, continue aspiration precaution,  Repeat swallow eval.  Hypernatremia, likely due to poor oral intake.  Not able to give IV hydration due to heart failure with worsening chest x-ray. If patient does not improve in the next 1-2 days,  son is open to hospice care  RA chronically immunosuppressed on methotrexate and prednisone at home Methotrexate held due to abnormal liver function  Hypothyroidism:  Continue synthroid  FTT , Palliative care input appreciated, may need to transition to hospice care if patient does not improve in 1-2 days    DVT Prophylaxis:heparin  Code Status: DNR  Family Communication: patient and son over the phone  Disposition Plan: may need hospice care if does not improved in 1-2 days  Consultants:  Cardiology  Palliative care  Procedures:  none  Antibiotics:  Rocephin then keflex   Objective: BP 94/61 (BP Location: Right Arm)   Pulse 63   Temp 98.7 F (37.1 C) (Oral)   Resp 18   Ht 5\' 7"  (1.702 m)   Wt 65.7 kg   SpO2 98%   BMI 22.69 kg/m   Intake/Output Summary (Last 24 hours) at 04/07/2019 1244 Last data filed at 04/07/2019 0556 Gross per 24 hour  Intake --  Output 925 ml  Net -925 ml   Filed Weights    04/04/19 0600 04/05/19 0550 04/07/19 0112  Weight: 68.8 kg 73.7 kg 65.7 kg    Exam: Patient is examined daily including today on 04/07/2019, exams remain the same as of yesterday except that has changed    General:  Hard of hearing, Confused, not following commands, bilateral mittens on  Cardiovascular: RRR  Respiratory: diminished at basis  Abdomen: Soft/ND/NT, positive BS  Musculoskeletal: No Edema  Neuro: confused, moving all extremities  Data Reviewed: Basic Metabolic Panel: Recent Labs  Lab 04/02/19 0504 04/03/19 0900 04/04/19 0545 04/06/19 0309 04/07/19 0517  NA 139 139 145 149* 149*  K 4.3 4.2 4.2 4.4 5.1  CL 101 100 99 101 105  CO2 26 26 30  33* 34*  GLUCOSE 117* 103* 115* 119* 104*  BUN 96* 95* 95* 98* 110*  CREATININE 2.35* 2.05* 1.98* 2.15* 2.52*  CALCIUM 8.6* 8.4* 8.8* 9.2 9.2  MG  --   --  2.5*  --   --    Liver Function Tests: Recent Labs  Lab 04/04/19 0545 04/06/19 0309 04/07/19 0517  AST 77* 58* 68*  ALT 83* 59* 52*  ALKPHOS 161* 120 121  BILITOT 0.3 0.6 1.2  PROT 6.6 6.8 7.3  ALBUMIN 3.1* 3.1* 3.2*   No results for input(s): LIPASE, AMYLASE in the last 168 hours. Recent Labs  Lab 04/04/19 0545  AMMONIA 21   CBC: Recent Labs  Lab 04/01/19 0646 04/04/19 0545 04/06/19 0309 04/07/19 0517  WBC 8.8 9.0 11.1* 11.5*  NEUTROABS  --  4.7 6.2 6.0  HGB  8.0* 8.6* 9.0* 9.2*  HCT 26.9* 29.7* 30.7* 32.5*  MCV 90.0 90.3 90.0 90.8  PLT 162 172 179 204   Cardiac Enzymes:   No results for input(s): CKTOTAL, CKMB, CKMBINDEX, TROPONINI in the last 168 hours. BNP (last 3 results) Recent Labs    05/12/18 1603 02/18/19 1639 03/30/19 1046  BNP >4,500.0* 2,601.2* >4,500.0*    ProBNP (last 3 results) No results for input(s): PROBNP in the last 8760 hours.  CBG: No results for input(s): GLUCAP in the last 168 hours.  Recent Results (from the past 240 hour(s))  Respiratory Panel by RT PCR (Flu A&B, Covid) - Nasopharyngeal Swab     Status:  None   Collection Time: 03/30/19 11:23 AM   Specimen: Nasopharyngeal Swab  Result Value Ref Range Status   SARS Coronavirus 2 by RT PCR NEGATIVE NEGATIVE Final    Comment: (NOTE) SARS-CoV-2 target nucleic acids are NOT DETECTED. The SARS-CoV-2 RNA is generally detectable in upper respiratoy specimens during the acute phase of infection. The lowest concentration of SARS-CoV-2 viral copies this assay can detect is 131 copies/mL. A negative result does not preclude SARS-Cov-2 infection and should not be used as the sole basis for treatment or other patient management decisions. A negative result may occur with  improper specimen collection/handling, submission of specimen other than nasopharyngeal swab, presence of viral mutation(s) within the areas targeted by this assay, and inadequate number of viral copies (<131 copies/mL). A negative result must be combined with clinical observations, patient history, and epidemiological information. The expected result is Negative. Fact Sheet for Patients:  https://www.moore.com/ Fact Sheet for Healthcare Providers:  https://www.young.biz/ This test is not yet ap proved or cleared by the [place] FDA and  has been authorized for detection and/or diagnosis of SARS-CoV-2 by FDA under an Emergency Use Authorization (EUA). This EUA will remain  in effect (meaning this test can be used) for the duration of the COVID-19 declaration under Section 564(b)(1) of the Act, 21 U.S.C. section 360bbb-3(b)(1), unless the authorization is terminated or revoked sooner.    Influenza A by PCR NEGATIVE NEGATIVE Final   Influenza B by PCR NEGATIVE NEGATIVE Final    Comment: (NOTE) The Xpert Xpress SARS-CoV-2/FLU/RSV assay is intended as an aid in  the diagnosis of influenza from Nasopharyngeal swab specimens and  should not be used as a sole basis for treatment. Nasal washings and  aspirates are unacceptable for Xpert  Xpress SARS-CoV-2/FLU/RSV  testing. Fact Sheet for Patients: https://www.moore.com/ Fact Sheet for Healthcare Providers: https://www.young.biz/ This test is not yet approved or cleared by the [place] FDA and  has been authorized for detection and/or diagnosis of SARS-CoV-2 by  FDA under an Emergency Use Authorization (EUA). This EUA will remain  in effect (meaning this test can be used) for the duration of the  Covid-19 declaration under Section 564(b)(1) of the Act, 21  U.S.C. section 360bbb-3(b)(1), unless the authorization is  terminated or revoked. Performed at [facility], [address] [place], [place] [address]   Urine culture     Status: Abnormal   Collection Time: 03/30/19  9:38 PM   Specimen: Urine, Random  Result Value Ref Range Status   Specimen Description URINE, RANDOM  Final   Special Requests   Final    NONE Performed at [facility], [address], [place], [place] [address]    Culture >=100,000 COLONIES/mL ESCHERICHIA COLI (A)  Final   Report Status 04/02/2019 FINAL  Final  Organism ID, Bacteria ESCHERICHIA COLI (A)  Final      Susceptibility   Escherichia coli - MIC*    AMPICILLIN 8 SENSITIVE Sensitive     CEFAZOLIN <=4 SENSITIVE Sensitive     CEFTRIAXONE <=0.25 SENSITIVE Sensitive     CIPROFLOXACIN <=0.25 SENSITIVE Sensitive     GENTAMICIN <=1 SENSITIVE Sensitive     IMIPENEM <=0.25 SENSITIVE Sensitive     NITROFURANTOIN <=16 SENSITIVE Sensitive     TRIMETH/SULFA <=20 SENSITIVE Sensitive     AMPICILLIN/SULBACTAM 4 SENSITIVE Sensitive     PIP/TAZO <=4 SENSITIVE Sensitive     * >=100,000 COLONIES/mL ESCHERICHIA COLI     Studies: DG CHEST PORT 1 VIEW  Result Date: 04/06/2019 CLINICAL DATA:  Cough EXAM: PORTABLE CHEST 1 VIEW COMPARISON:  04/04/2019 FINDINGS: Cardiomegaly with vascular congestion. Bilateral airspace disease, most pronounced in the lower lobes, right greater than left. This is  progressed since prior study. Small right pleural effusion, also slightly enlarged since prior study. IMPRESSION: Bilateral lower lobe airspace opacities, right greater than left with small right effusion. Findings worsening since prior study. Electronically Signed   By: Rolm Baptise M.D.   On: 04/06/2019 19:06    Scheduled Meds: . acetaminophen  1,000 mg Oral BID  . aspirin EC  81 mg Oral Daily  . atorvastatin  20 mg Oral Daily  . carvedilol  3.125 mg Oral BID WC  . cephALEXin  500 mg Oral Q12H  . clopidogrel  75 mg Oral Daily  . feeding supplement (ENSURE ENLIVE)  237 mL Oral TID BM  . folic acid  3 mg Oral Daily  . heparin  5,000 Units Subcutaneous Q12H  . levothyroxine  100 mcg Oral QAC breakfast  . multivitamin with minerals  1 tablet Oral Daily  . predniSONE  5 mg Oral Daily  . sodium chloride flush  3 mL Intravenous Q12H  . vitamin B-12  1,000 mcg Oral Daily    Continuous Infusions: . sodium chloride 250 mL (04/02/19 1012)     Time spent: 14mins I have personally reviewed and interpreted on  04/07/2019 daily labs, imagings as discussed above under date review session and assessment and plans.  I reviewed all nursing notes, pharmacy notes, consultant notes,  vitals, pertinent old records  I have discussed plan of care as described above with RN , patient  And son on 04/07/2019   Florencia Reasons MD, PhD, FACP  [facility]  [email or web address],  TR[facility]1 04/07/2019, 12:44 PM  LOS: 8 days

## 2019-04-07 NOTE — Progress Notes (Signed)
  Speech Language Pathology Treatment: Dysphagia  Patient Details Name: Hunter Choi MRN: [id number] DOB: 04/13/1927 Today's Date: 04/07/2019 Time: 1829-9371 SLP Time Calculation (min) (ACUTE ONLY): 11 min  Assessment / Plan / Recommendation Clinical Impression  Pt presents with worsening swallowing function as compared to prior sessions.  RN requested reevaluation of pt.  RN noted difficulty with A-P transit of purees during meal times and increased coughing with all consistencies.  Pt was noted to cough consistently following trials of nectar thick liquid.  Wet belch noted prior to cough x1, suggestive of reflux; however, immediate wet cough noted following cup sip of nectar thick liquid as well.  Pt also noted to cough prior to administration of PO trials and following delay after swallow (which again may implicate reflux).  Pt had marginally better tolerance of puree bolus trials. RN reported better oral manipulation today.  CXR from 1/9 shows worsening R>L lower lobe opacites.  Recommend pt remain NPO except medications which may be given crushed with puree.  Recommend further evaluation of pharyngeal swallow function with instrumental evaluation.  MBSS planned for next date.    HPI HPI: 86 y.o. male with medical history significant of CAD, s/p TARV, CHF, CVA, hypothyroidism who presents to ER for worsening dyspnea for one week. CXR 1/9: "Bilateral lower lobe airspace opacities, right greater than leftwith small right effusion. Findings worsening since prior study"  MBSS Feb 2020 revealed transient penetration of thin liquids, but esophageal reflux into pharynx was noted as well increasing risk of aspiration.      SLP Plan  MBS       Recommendations  Diet recommendations: NPO Medication Administration: Crushed with puree                Oral Care Recommendations: Oral care QID Follow up Recommendations: 24 hour supervision/assistance SLP Visit Diagnosis: Dysphagia, unspecified  (R13.10) Plan: MBS       GO                Kerrie Pleasure, MA, CCC-SLP Acute Rehabilitation Services Office: [phone]; Pager (1/10): [phone] 04/07/2019, 1:20 PM

## 2019-04-08 ENCOUNTER — Inpatient Hospital Stay (HOSPITAL_COMMUNITY): Payer: Medicare Other

## 2019-04-08 DIAGNOSIS — E870 Hyperosmolality and hypernatremia: Secondary | ICD-10-CM

## 2019-04-08 LAB — URINALYSIS, ROUTINE W REFLEX MICROSCOPIC
Bilirubin Urine: NEGATIVE
Glucose, UA: NEGATIVE mg/dL
Hgb urine dipstick: NEGATIVE
Ketones, ur: NEGATIVE mg/dL
Nitrite: NEGATIVE
Protein, ur: >=300 mg/dL — AB
Specific Gravity, Urine: 1.017 (ref 1.005–1.030)
pH: 9 — ABNORMAL HIGH (ref 5.0–8.0)

## 2019-04-08 LAB — CBC WITH DIFFERENTIAL/PLATELET
Abs Immature Granulocytes: 0.07 10*3/uL (ref 0.00–0.07)
Basophils Absolute: 0.1 10*3/uL (ref 0.0–0.1)
Basophils Relative: 1 %
Eosinophils Absolute: 0.1 10*3/uL (ref 0.0–0.5)
Eosinophils Relative: 1 %
HCT: 32.8 % — ABNORMAL LOW (ref 39.0–52.0)
Hemoglobin: 9.3 g/dL — ABNORMAL LOW (ref 13.0–17.0)
Immature Granulocytes: 1 %
Lymphocytes Relative: 40 %
Lymphs Abs: 5 10*3/uL — ABNORMAL HIGH (ref 0.7–4.0)
MCH: 25.8 pg — ABNORMAL LOW (ref 26.0–34.0)
MCHC: 28.4 g/dL — ABNORMAL LOW (ref 30.0–36.0)
MCV: 91.1 fL (ref 80.0–100.0)
Monocytes Absolute: 1 10*3/uL (ref 0.1–1.0)
Monocytes Relative: 8 %
Neutro Abs: 6.5 10*3/uL (ref 1.7–7.7)
Neutrophils Relative %: 49 %
Platelets: 217 10*3/uL (ref 150–400)
RBC: 3.6 MIL/uL — ABNORMAL LOW (ref 4.22–5.81)
RDW: 18.7 % — ABNORMAL HIGH (ref 11.5–15.5)
WBC: 12.7 10*3/uL — ABNORMAL HIGH (ref 4.0–10.5)
nRBC: 2.4 % — ABNORMAL HIGH (ref 0.0–0.2)

## 2019-04-08 LAB — COMPREHENSIVE METABOLIC PANEL
ALT: 199 U/L — ABNORMAL HIGH (ref 0–44)
AST: 325 U/L — ABNORMAL HIGH (ref 15–41)
Albumin: 3.3 g/dL — ABNORMAL LOW (ref 3.5–5.0)
Alkaline Phosphatase: 203 U/L — ABNORMAL HIGH (ref 38–126)
Anion gap: 15 (ref 5–15)
BUN: 134 mg/dL — ABNORMAL HIGH (ref 8–23)
CO2: 33 mmol/L — ABNORMAL HIGH (ref 22–32)
Calcium: 9.4 mg/dL (ref 8.9–10.3)
Chloride: 106 mmol/L (ref 98–111)
Creatinine, Ser: 3.5 mg/dL — ABNORMAL HIGH (ref 0.61–1.24)
GFR calc Af Amer: 17 mL/min — ABNORMAL LOW (ref >60)
GFR calc non Af Amer: 15 mL/min — ABNORMAL LOW (ref >60)
Glucose, Bld: 119 mg/dL — ABNORMAL HIGH (ref 70–99)
Potassium: 5.1 mmol/L (ref 3.5–5.1)
Sodium: 154 mmol/L — ABNORMAL HIGH (ref 135–145)
Total Bilirubin: 1.6 mg/dL — ABNORMAL HIGH (ref 0.3–1.2)
Total Protein: 7 g/dL (ref 6.5–8.1)

## 2019-04-08 NOTE — Progress Notes (Signed)
I could only get pt to take about 50% of his nighttime meds crushed in apple sause  Pt refused to open mouth swallow and would swat spoon away.  Pt was becoming increasingly agitated but still couldn't comply on second try

## 2019-04-08 NOTE — Progress Notes (Signed)
Modified Barium Swallow Progress Note  Patient Details  Name: Hunter Choi MRN: [id number] Date of Birth: Feb 07, 1928  Today's Date: 04/08/2019  Modified Barium Swallow completed.  Full report located under Chart Review in the Imaging Section.  Brief recommendations include the following:  Clinical Impression Prior to initiating po presentations for MBS, pt was noted to have dry oral cavity with bright green residue, possibly medication. Oral care was completed with suction, with minimal clearing, due to poor participation.   Pt presents with mild-moderate oropharyngeal dysphagia, characterized as follows:  ORALLY, pt exhibits slow oral prep, poor bolus formation and prematures spillage over the tongue base. Extended and disorganized oral prep was noted on dys2 (soft, chopped) textures. Diffuse oral residue noted after the swallow on each consistency.   PHARYNGEAL swallow is characterized by reflex trigger at the level of the vallecula on honey thick liquids, puree, and dys 2 (soft chopped) textures. Trigger noted at the pyriform sinus on nectar thick liquids. Penetration BEFORE the swallow was noted on nectar thick liquids via cup sip, however, no penetration was seen on nectar thick liquids via teaspoon. Post-swallow residue was noted in the vallecula, pyriform, and lateral channels after the swallow. Thin liquids and regular solids were not presented at this time, due to aspiration risk.   Esophageal sweep revealed it slow to clear, however, no backflow into the pharynx was noted (as seen on February 2020 MBS). At this time, recommend puree diet and honey thick liquid via cup sip, meds crushed in puree, with 1:1 assist for all po intake. Pt may have teaspoon boluses of nectar thick liquid, however, cup sips of nectar thick liquid are NOT RECOMMENDED due to aspiration risk.  Pt may have small bites of Dys2 consistencies for oral pleasure or snacks, but puree textures are recommended for primary  nutrition for energy conservation. Safe swallow precautions were sent with transport to be posted at [facility]. SLP will follow up for education, hopefully with family, as pt is unable to comprehend and retain new information. MD and RN notified of results and recommendations.      Swallow Evaluation Recommendations Recommended Consults: Consider esophageal assessment   SLP Diet Recommendations: Dysphagia 1 (Puree) solids;Honey thick liquids   Liquid Administration via: Cup;No straw   Medication Administration: Crushed with puree   Supervision: Full assist for feeding;Full supervision/cueing for compensatory strategies   Compensations: Minimize environmental distractions;Slow rate;Small sips/bites   Postural Changes: Remain semi-upright after after feeds/meals (Comment);Seated upright at 90 degrees   Oral Care Recommendations: Oral care QID;Staff/trained caregiver to provide oral care   Other Recommendations: Order thickener from pharmacy;Have oral suction available   Harlow Asa, MSP, CCC-SLP Speech Language Pathologist Office: [phone] Pager: [phone]  Leigh Aurora 04/08/2019,11:15 AM

## 2019-04-08 NOTE — Progress Notes (Signed)
Physical Therapy Treatment Patient Details Name: Hunter Choi MRN: [id number] DOB: 1927/07/29 Today's Date: 04/08/2019    History of Present Illness Pt is an 86 y.o. male admitted 03/30/19 with SOB, weight gain and AMS progressively over 2 weeks. (+) Ecoli. CXR with volume overload; worked up for acute on chronic CHF. PMH includes CAD s/p CABG, CHF, aortic stenosis s/p aortic valve replacement, AFIB, AAA, RA on chronic methotrexate and prednisone, CVA with L eye deficits.    PT Comments    Patient seen for mobility progression. Pt able to get EOB with mod A +2 and sit EOB with min guard A. Unable to progress functional transfer training due to pt's cognition/difficulty following commands. Pt's speech mostly unintelligible throughout session and pt moaning and "calling out" but unable to communicate the reason. PT will continue to follow acutely and progress as tolerated. May need to consider post acute rehab if pt does not begin to make more progress with mobility.     Follow Up Recommendations  Home health PT;Supervision/Assistance - 24 hour     Equipment Recommendations  TBD pending progress   Recommendations for Other Services       Precautions / Restrictions Precautions Precautions: Fall Precaution Comments: Very HOH Restrictions Weight Bearing Restrictions: No    Mobility  Bed Mobility Overal bed mobility: Needs Assistance Bed Mobility: Supine to Sit;Sit to Supine     Supine to sit: Mod assist;HOB elevated;+2 for physical assistance Sit to supine: Supervision   General bed mobility comments: MOD A+2 for supine>sit to scoot hips to EOB and elevate trunk. pt impulsively returned self to supine with supervision when attempting to get ready to stand  Transfers                 General transfer comment: unable to attempt due to pt's cognition and returning to supine abruptly  Ambulation/Gait                 Stairs             Wheelchair Mobility     Modified Rankin (Stroke Patients Only)       Balance Overall balance assessment: Needs assistance Sitting-balance support: Feet supported;Bilateral upper extremity supported Sitting balance-Leahy Scale: Fair Sitting balance - Comments: close min guard                                    Cognition Arousal/Alertness: Awake/alert Behavior During Therapy: Restless;Agitated Overall Cognitive Status: No family/caregiver present to determine baseline cognitive functioning                                 General Comments: pts cognition seems to have worsened since last session as pt unable to communicate intelligibly      Exercises      General Comments General comments (skin integrity, edema, etc.): noted possible ulnar drift deformity in L hand when doffing mit.      Pertinent Vitals/Pain Pain Assessment: Faces Faces Pain Scale: Hurts little more Pain Location: general discomfort with movement Pain Descriptors / Indicators: Discomfort;Grimacing Pain Intervention(s): Monitored during session;Repositioned    Home Living                      Prior Function            PT Goals (current goals can now  be found in the care plan section) Acute Rehab PT Goals Patient Stated Goal: "I need to get back home" Progress towards PT goals: Not progressing toward goals - comment    Frequency    Min 3X/week      PT Plan Current plan remains appropriate    Co-evaluation PT/OT/SLP Co-Evaluation/Treatment: Yes Reason for Co-Treatment: Complexity of the patient's impairments (multi-system involvement);For patient/therapist safety;To address functional/ADL transfers PT goals addressed during session: Mobility/safety with mobility        AM-PAC PT "6 Clicks" Mobility   Outcome Measure  Help needed turning from your back to your side while in a flat bed without using bedrails?: A Little Help needed moving from lying on your back to sitting  on the side of a flat bed without using bedrails?: A Lot Help needed moving to and from a bed to a chair (including a wheelchair)?: A Lot Help needed standing up from a chair using your arms (e.g., wheelchair or bedside chair)?: A Lot Help needed to walk in hospital room?: Total Help needed climbing 3-5 steps with a railing? : Total 6 Click Score: 11    End of Session Equipment Utilized During Treatment: Oxygen Activity Tolerance: Patient tolerated treatment well Patient left: with call bell/phone within reach;in bed;with bed alarm set;with nursing/sitter in room Nurse Communication: Mobility status PT Visit Diagnosis: Other abnormalities of gait and mobility (R26.89);Muscle weakness (generalized) (M62.81)     Time: 3382-5053 PT Time Calculation (min) (ACUTE ONLY): 15 min  Charges:  $Therapeutic Activity: 8-22 mins                     Earney Navy, PTA Acute Rehabilitation Services Pager: [phone] Office: [phone]     Darliss Cheney 04/08/2019, 3:42 PM

## 2019-04-08 NOTE — Plan of Care (Signed)
  Problem: Education: Goal: Knowledge of General Education information will improve Description: Including pain rating scale, medication(s)/side effects and non-pharmacologic comfort measures Outcome: Progressing   Problem: Health Behavior/Discharge Planning: Goal: Ability to manage health-related needs will improve Outcome: Progressing   Problem: Nutrition: Goal: Adequate nutrition will be maintained Outcome: Progressing   Problem: Skin Integrity: Goal: Risk for impaired skin integrity will decrease Outcome: Progressing   Problem: Safety: Goal: Ability to remain free from injury will improve Outcome: Progressing

## 2019-04-08 NOTE — Progress Notes (Signed)
Daily Progress Note   Patient Name: Hunter Choi       Date: 04/08/2019 DOB: Jun 08, 1927  Age: 86 y.o. MRN#: [id number] Attending Physician: Myrlene Broker, MD Primary Care Physician: [name], MD Admit Date: 03/30/2019  Reason for Consultation/Follow-up: Establishing goals of care  Subjective: Patient opens eyes to voice. Extremely hard of hearing. Moaning and will not answer questions or follow commands. Room air. No family at bedside. Discussed with RN.  GOC: F/u with son, Hunter Choi via telephone. This NP spoke with Hunter Choi on 04/05/19 when MOST was completed with DNR/DNI code status.    Discussed unfortunate clinical decline including kidney function over the weekend. Discussed course of hospitalization including diagnoses, interventions, plan of care. Hunter Choi shares his conversation with Dr. Roda Shutters yesterday including plan for 24-48 hours of watchful waiting. Hunter Choi shares that him and his brother are "prepared" and considering hospice, likely a facility because they do not have "experience or equipment to keep him comfortable and safe." Hunter Choi shares that his dad has "fought" a lot especially in the last few years and it may be nearing time for him to have "peace."   Therapeutic listening and emotional support provided. Reassured of ongoing support from palliative. PMT contact information given.   Length of Stay: 9  Current Medications: Scheduled Meds:  . acetaminophen  1,000 mg Oral BID  . aspirin EC  81 mg Oral Daily  . atorvastatin  20 mg Oral Daily  . carvedilol  3.125 mg Oral BID WC  . cephALEXin  500 mg Oral Q12H  . clopidogrel  75 mg Oral Daily  . feeding supplement (ENSURE ENLIVE)  237 mL Oral TID BM  . folic acid  3 mg Oral Daily  . heparin  5,000 Units Subcutaneous Q12H    . levothyroxine  100 mcg Oral QAC breakfast  . multivitamin with minerals  1 tablet Oral Daily  . predniSONE  5 mg Oral Daily  . sodium chloride flush  3 mL Intravenous Q12H  . vitamin B-12  1,000 mcg Oral Daily    Continuous Infusions: . sodium chloride 250 mL (04/02/19 1012)    PRN Meds: sodium chloride, acetaminophen, calcium carbonate, fluticasone, guaiFENesin-dextromethorphan, nitroGLYCERIN, ondansetron (ZOFRAN) IV, Resource ThickenUp Clear, sodium chloride flush  Physical Exam Vitals and nursing note reviewed.  Constitutional:  Appearance: He is ill-appearing.  HENT:     Head: Normocephalic and atraumatic.  Cardiovascular:     Rate and Rhythm: Normal rate.  Pulmonary:     Effort: No tachypnea, accessory muscle usage or respiratory distress.  Skin:    General: Skin is warm and dry.  Neurological:     Mental Status: He is easily aroused.     Comments: Moaning. Disoriented             Vital Signs: BP 114/62 (BP Location: Left Arm)   Pulse (!) 106 Comment: pt was fighting vital sighns   Temp 97.7 F (36.5 C) (Oral)   Resp 20   Ht 5\' 7"  (1.702 m)   Wt 62.9 kg   SpO2 98%   BMI 21.72 kg/m  SpO2: SpO2: 98 % O2 Device: O2 Device: Room Air O2 Flow Rate: O2 Flow Rate (L/min): 1 L/min  Intake/output summary:   Intake/Output Summary (Last 24 hours) at 04/08/2019 0944 Last data filed at 04/08/2019 0500 Gross per 24 hour  Intake 0 ml  Output 500 ml  Net -500 ml   LBM: Last BM Date: 04/07/19 Baseline Weight: Weight: 68.4 kg Most recent weight: Weight: 62.9 kg       Palliative Assessment/Data: PPS 20%    Flowsheet Rows     Most Recent Value  Intake Tab  Referral Department  Hospitalist  Unit at Time of Referral  Cardiac/Telemetry Unit  Palliative Care Primary Diagnosis  Cardiac  Date Notified  04/03/19  Palliative Care Type  New Palliative care  Reason Not Seen  Consult cancelled [Attending felt consult no longer needed]  Reason for referral  Clarify  Goals of Care  Date first seen by Palliative Care  04/05/19  Clinical Assessment  Palliative Performance Scale Score  20%  Psychosocial & Spiritual Assessment  Palliative Care Outcomes  Patient/Family meeting held?  Yes  Who was at the meeting?  son Hunter Choi)  [place] goals of care, Counseled regarding hospice, Provided end of life care assistance, Provided advance care planning, Provided psychosocial or spiritual support, Changed CPR status, Completed durable DNR, Linked to palliative care logitudinal support, ACP counseling assistance      Patient Active Problem List   Diagnosis Date Noted  . Palliative care by specialist   . Goals of care, counseling/discussion   . Creatinine elevation   . CHF (congestive heart failure) ([place]) 03/30/2019  . Short of breath on exertion   . Acute exacerbation of CHF (congestive heart failure) ([place]) 05/12/2018  . Nausea vomiting and diarrhea 04/22/2018  . B12 deficiency 03/27/2018  . Hypothyroidism 03/27/2018  . Chronic combined systolic and diastolic CHF (congestive heart failure) ([place]) 03/27/2018  . Protein-calorie malnutrition, severe 03/24/2018  . Generalized weakness   . Acute metabolic encephalopathy 67/89/3810  . CKD (chronic kidney disease) stage 3, GFR 30-59 ml/min 12/09/2017  . SIRS (systemic inflammatory response syndrome) ([place]) 08/03/2017  . Essential hypertension 08/03/2017  . Elevated troponin   . Sepsis due to urinary tract infection ([place]) 10/02/2015  . Hard of hearing   . Pressure ulcer 01/28/2015  . Acute on chronic combined systolic and diastolic congestive heart failure, NYHA class 4 ([place]) 01/27/2015  . Hyponatremia 11/21/2014  . Elevated LFTs 11/14/2014  . Anemia 05/23/2014  . Thrombocytopenia ([place]) 02/21/2014  . Malnutrition of moderate degree ([place]) 02/21/2014  . Occlusion and stenosis of carotid artery without mention of cerebral infarction 09/04/2013  . Aftercare following surgery of the  circulatory system, NEC  09/04/2013  . AAA (abdominal aortic aneurysm) without rupture (H[facility]) 03/06/2013  . 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 Coronary artery disease involving coronary bypass graft of native heart with angina pectoris (HCC)   . GERD (gastroesophageal reflux disease)   . Thyroid disease   . Embolism involving retinal artery   . Iliac artery aneurysm (HCC)   . Rheumatoid arthritis (HCC) 04/22/2010    Palliative Care Assessment & Plan   Patient Profile: 86 y.o. male with past medical history of CAD, CABG, combined CHF with EF 25-30%, aortic stenosis s/p TAVR, paroxysmal atrial fib, AAA, CKD stage IIIb, rheumatoid arthritis on chronic methotrexate and prednisone, CVA  admitted on 03/30/2019 with AMS, weight gain, and shortness of breath. Hospital admission for acute on chronic CHF with EF 25-30%, acute hypoxic respiratory failure, transaminitis likely from liver congestion, acute on chronic renal failure, and ongoing encephalopathy/delirium likely baseline vascular dementia. Cardiology recommending palliative medicine consultation for goals of care  Assessment: Acute on chronic systolic heart failure with EF 25-30% Acute hypoxic respiratory failure Acute on chronic renal failure with CKD stage IIIb Transaminitis Ecoli UTI Nonsustained v.tach CAD s/p hx of CABG Encephalopathy/delirium likely underlying vascular dementia Esophageal dysphagia  Recommendations/Plan:  Electronic MOST form completed with son on 04/05/19. DNR/DNI.  Clinical decline this weekend with worsening renal function. Watchful waiting for 24-48 more hours. Continue current plan of care.   Son shares that him and brother are 'prepared' and realistic that they may need to consider hospice in the next few days.   PMT will continue to follow and support patient/family.   Code Status: DNR   Code Status Orders  (From admission, onward)         Start     Ordered   04/05/19  1209  Do not attempt resuscitation (DNR)  Continuous    Question Answer Comment  In the event of cardiac or respiratory ARREST Do not call a "code blue"   In the event of cardiac or respiratory ARREST Do not perform Intubation, CPR, defibrillation or ACLS   In the event of cardiac or respiratory ARREST Use medication by any route, position, wound care, and other measures to relive pain and suffering. May use oxygen, suction and manual treatment of airway obstruction as needed for comfort.      04/05/19 1208        Code Status History    Date Active Date Inactive Code Status Order ID Comments User Context   03/30/2019 1310 04/05/2019 1208 Full Code [id number]  Emeline General, MD ED   02/19/2019 0054 02/22/2019 1714 Full Code [id number]  Arvilla Market, DO ED   05/12/2018 2145 05/17/2018 1924 Full Code [id number]  John Giovanni, MD ED   04/22/2018 1741 04/30/2018 1842 Full Code [id number]  Dorcas Carrow, MD Inpatient   03/22/2018 0909 03/27/2018 2001 Full Code [id number]  Jonah Blue, MD Inpatient   12/09/2017 1439 12/15/2017 2152 Full Code [id number]  [name], MD ED   08/03/2017 0626 08/07/2017 1919 Full Code [id number]  Eduard Clos, MD ED   02/12/2017 2030 02/17/2017 2038 Full Code [id number]  Chrystie Nose, MD Inpatient   10/02/2015 1725 10/06/2015 2338 Full Code [id number]  Russella Dar, NP Inpatient   05/01/2015 1644 05/11/2015 1804 Full Code [id number]  Gwenyth Bender, NP ED   05/01/2015 1644 05/01/2015 1644 Full Code [id number]  Gwenyth Bender, NP ED   01/27/2015 1206 02/03/2015 1715 Full Code [id number]  Meredith Pel,  NP Inpatient   11/15/2014 0005 11/18/2014 2101 Full Code [id number]  Levie Heritage DO Inpatient   05/23/2014 2045 05/25/2014 1859 Full Code [id number]  Ron Parker, MD Inpatient   02/19/2014 1221 02/22/2014 1636 Full Code [id number]  Eddie North, MD Inpatient   01/16/2013 1533 01/22/2013 1418 Full Code [id number]  [name], MD Inpatient    01/15/2013 1136 01/16/2013 1533 Full Code [id number]  [name], MD Inpatient   Advance Care Planning Activity       Prognosis:  Poor prognosis  Discharge Planning:  To Be Determined  Care plan was discussed with RN, son Hunter Choi)  Thank you for allowing the Palliative Medicine Team to assist in the care of this patient.   Time In: 0930- 1330- Time Out: 0940 1345 Total Time 25 Prolonged Time Billed no      Greater than 50%  of this time was spent counseling and coordinating care related to the above assessment and plan.  Vennie Homans, DNP, FNP-C Palliative Medicine Team  Phone: [phone] Fax: [phone]  Please contact Palliative Medicine Team phone at [phone] for questions and concerns.

## 2019-04-08 NOTE — Progress Notes (Signed)
Occupational Therapy Treatment Patient Details Name: Hunter Choi MRN: [id number] DOB: [birth year] Today's Date: 04/08/2019    History of present illness Pt is an 86 y.o. male admitted 03/30/19 with SOB, weight gain and AMS progressively over 2 weeks. (+) Ecoli. CXR with volume overload; worked up for acute on chronic CHF. PMH includes CAD s/p CABG, CHF, aortic stenosis s/p aortic valve replacement, AFIB, AAA, RA on chronic methotrexate and prednisone, CVA with L eye deficits.   OT comments  Pt making minimal progress towards OT goals this session. Pt required MOD A +2 to transition from supine>EOB. Pt sat EOB ~ 5 minutes with min guard for safety. Pt required total A to complete UB ADL despite MAX multimodal cues. Pt impulsively returned to supine when attempting sit>stand. Pt unable to communicate intelligibly with therapist throughout session although crying out throuhgout session. Will continue to follow acutely per POC as DC recs may need to be downgraded to SNF as pt currently not progressing as anticipated with therapies.    Follow Up Recommendations  Home health OT;Supervision/Assistance - 24 hour    Equipment Recommendations  None recommended by OT    Recommendations for Other Services      Precautions / Restrictions Precautions Precautions: Fall Precaution Comments: Very HOH Restrictions Weight Bearing Restrictions: No       Mobility Bed Mobility Overal bed mobility: Needs Assistance Bed Mobility: Supine to Sit;Sit to Supine     Supine to sit: Mod assist;HOB elevated;+2 for physical assistance Sit to supine: Supervision   General bed mobility comments: MOD A+2 for supine>sit to scoot hips to EOB and elevate trunk. pt impulsively returned self to supine with supervision when attempting to get ready to stand  Transfers                 General transfer comment: unable to attempt    Balance Overall balance assessment: Needs assistance Sitting-balance support:  Feet supported;Bilateral upper extremity supported Sitting balance-Leahy Scale: Fair Sitting balance - Comments: close min guard                                   ADL either performed or assessed with clinical judgement   ADL Overall ADL's : Needs assistance/impaired     Grooming: Wash/dry face;Sitting;Total assistance Grooming Details (indicate cue type and reason): unable to wash face without total A despite MAX multimodal cues                   Toilet Transfer Details (indicate cue type and reason): attempted sit>stand in prep for simulated transfer with pt laying back down impulsively         Functional mobility during ADLs: Moderate assistance;+2 for physical assistance;+2 for safety/equipment(bed mobility only) General ADL Comments: session focus on sitting balance and seated UB ADLs. pt confused and slightly agitated throughout session with pt moaning and calling out.     Vision Patient Visual Report: No change from baseline     Perception     Praxis      Cognition Arousal/Alertness: Awake/alert Behavior During Therapy: Restless;Agitated Overall Cognitive Status: No family/caregiver present to determine baseline cognitive functioning                                 General Comments: pts cognition seems to have worsened since last session as pt unable to communicate  intelligibly.        Exercises     Shoulder Instructions       General Comments noted possible ulnar drift deformity in L hand when doffing mit.    Pertinent Vitals/ Pain       Pain Assessment: Faces Faces Pain Scale: Hurts little more Pain Location: general discomfort with movement Pain Descriptors / Indicators: Discomfort;Grimacing Pain Intervention(s): Monitored during session;Repositioned  Home Living                                          Prior Functioning/Environment              Frequency  Min 2X/week         Progress Toward Goals  OT Goals(current goals can now be found in the care plan section)  Progress towards OT goals: Not progressing toward goals - comment(d/t worsening cognition)  Acute Rehab OT Goals Patient Stated Goal: "I need to get back home" OT Goal Formulation: With patient Time For Goal Achievement: 04/16/19 Potential to Achieve Goals: Good  Plan Discharge plan remains appropriate    Co-evaluation                 AM-PAC OT "6 Clicks" Daily Activity     Outcome Measure   Help from another person eating meals?: A Lot Help from another person taking care of personal grooming?: A Lot Help from another person toileting, which includes using toliet, bedpan, or urinal?: Total Help from another person bathing (including washing, rinsing, drying)?: A Lot Help from another person to put on and taking off regular upper body clothing?: A Lot Help from another person to put on and taking off regular lower body clothing?: Total 6 Click Score: 10    End of Session Equipment Utilized During Treatment: Gait belt  OT Visit Diagnosis: Unsteadiness on feet (R26.81);Other abnormalities of gait and mobility (R26.89);Muscle weakness (generalized) (M62.81)   Activity Tolerance Other (comment)(limited by confusion and restlessness)   Patient Left in bed;with call bell/phone within reach;with bed alarm set;with nursing/sitter in room;Other (comment)(mitts on)   Nurse Communication Mobility status        Time: 7564-3329 OT Time Calculation (min): 14 min  Charges: OT General Charges $OT Visit: 1 Visit  Lanier Clam., COTA/L Acute Rehabilitation Services [phone]    Ihor Gully 04/08/2019, 12:56 PM

## 2019-04-08 NOTE — Progress Notes (Signed)
PROGRESS NOTE    Hunter Choi  ZOX:[id number] DOB: [birth year] DOA: 03/30/2019 PCP: [name], MD   Brief Narrative: 86 year old male with history ofCAD/CABG, diastolic congestive heart failure, aortic stenosiss/paortic valve replacement, paroxysmal atrial fib, AAA, rheumatoid arthritis on chronic methotrexate and prednisone, CVA with left eye deficits  Presented to ED01/02/21withcomplaints of shortness of breath and weight gain and altered mental status, ongoing about 2 weeks, failed to respond to outpatient escalation in diuretic therapy with Lasix, patient's son is primary caretaker.  ER course:volume overload versus pneumonia on chest x-ray, more likely volume overload. Elevated troponins were trending down, EKG was not particularly concerning. Crreduced from baseline.  cardiology was consulted, advised against heparin drip elevated troponin is most likely due to demand ischemia/decompensated heart failure and in the setting of renal insufficiency, okay to leave on aspirin and Plavix and treat for CHF exacerbation Lasix 80 mg IV twice daily.   Echocardiogram01/02/21: EF 25 to 30%, severely decreased left ventricular function, grade 3 diastolic dysfunction, right ventricle moderately reduced systolic function and moderate enlargement, moderate to severe mitral valve regurg,dilatedIVC~NOsignificant change from prior study.  HPI/Recap of past 24 hours:  Remain confused , cr trend up 3.5, sodium 154, repeat cxr got worse He is nectar thick due SLP recommendation, meds crushed in puree, took only 50% night time meds  Assessment & Plan:   Active Problems:   Short of breath on exertion   CHF (congestive heart failure) (HCC)   Palliative care by specialist   Goals of care, counseling/discussion   Creatinine elevation  Acute on chronic systolic heart failure, acute hypoxic respiratory failure. -Echocardiogram reveals LVEF 25 to 30% with grade 3 diastolic  dysfunction and global hypokinesis. Also has moderate RV dysfunction -currently on coreg 3.125 with holding parameters, lasix held due to worsening of cr, he is likely developing cardiorenal syndrome, repeat chest x-ray with persistent vascular congestion worsening since prior study -Cardiology recommended palliative care consult, I discussed with son over the phone today, son is open to hospice care and understands the situation probably will do this tomorrow if continued worsening  Transaminitis -Likely from liver congestion from heart failure exacerbation -repeat LFT remain elevated and worsening AST/ALT -ammonia level unremarkable   Nonsustained V. Tach,  -He was not able to tolerate Coreg initially due to hypotension, now his blood pressure has improved, restarted on  low-dose Coreg on 1/8   CAD status post CABG -CAD status post CABG with plan for medical therapy given poor revascularization options (occluded grafts). -on coreg, asa, plavix and statin  Other chronic cardiac issues:Status post TAVR, prosthetic function normal by echocardiogram with mild perivalvular leak,Moderate to severe mitral regurgitation  H/o left internal carotid artery occlusion with patent right carotid artery,  H/o ABDOMINAL AORTIC ANEURYSM AND BILATERAL COMMON ILIAC ARTERY ANEURYSMS Followed by vascular surgery Dr Edilia Bo  Acute on chronic renal failure with CKD stage IIIbat baseline./Ecoli UTI -he was treated with rocephin on 1/4-5, he completed keflex course for E coli UTI -Recheck u/a and culture today to rule out persistent infection -cr increasing drastically to 3.5 today, likely due to cardiorenal, recheck U/A today - renal dosing meds -renal US previously no hydronephrosis , does show medical renal disease   Normocytic anemia, likely anemia of chronic disease hgb range from 8-9, close to baseline FOBT negative  -Hg stable  Encephalopathy/delirium?  Likely baseline vascular  dementia Per son ,patient at baseline is "alert, talkative, happy" "but he tends to become confused when he is in the hospital.  This has happened a couple of times." Frequent reorientation  Per son he is more confused than usual,  ct head ordered showed " No acute finding by CT. Advanced generalized brain atrophy. Chronic small-vessel ischemic changes throughout the brain. Old small vessel cerebellar infarctions. Old right parietal cortical infarction. Old small left parietal vertex infarction." -Checking U/A again to rule out persistent infection  esophageal dysphagia Speech recommend nectar thick today so diet amended   Hypernatremia, likely due to poor oral intake.  Not able to give IV hydration due to heart failure with worsening chest x-ray. If patient does not improve in the next day,  son is open to hospice care -Na increasing to 154 today  RA chronically immunosuppressed on methotrexate and prednisone at home Methotrexate held due to abnormal liver function Continue prednisone  Hypothyroidism:  Continue synthroid  FTT , Palliative care input appreciated, may need to transition to hospice care if patient does not improve/continues to worsen  DVT prophylaxis: heparin Code Status: DNR Family Communication: Talked with [name], son and updated on condition and expectation/plan Disposition Plan: likely d/c with/to hospice in next 1-2 days  Consultants:  Cardiology Palliative care  Antimicrobials:   None currently, completed rocephin 2 days then keflex 5 days  Subjective: Moaning some, not coherent. Hard or hearing but able to listen when spoken to. Responds to name and follows simple commands.   Objective: Vitals:   04/07/19 1733 04/07/19 2048 04/08/19 0557 04/08/19 0614  BP: 106/65 101/66 114/62   Pulse: 88 99 (!) 106   Resp:  18 20   Temp:  98.1 F (36.7 C) 97.7 F (36.5 C)   TempSrc:  Oral Oral   SpO2:      Weight:    62.9 kg  Height:         Intake/Output Summary (Last 24 hours) at 04/08/2019 0830 Last data filed at 04/08/2019 0500 Gross per 24 hour  Intake 0 ml  Output 500 ml  Net -500 ml   Filed Weights   04/05/19 0550 04/07/19 0112 04/08/19 0614  Weight: 73.7 kg 65.7 kg 62.9 kg    Examination:  General exam: Appears comfortable  Respiratory system: some rales. Respiratory effort normal. Cardiovascular system: S1 & S2 heard, murmur  Gastrointestinal system: Abdomen is nondistended, soft and nontender. No organomegaly or masses felt. Normal bowel sounds heard. Central nervous system: Alert and oriented. No focal neurological deficits. Extremities: did not cooperate with exam, moving all 4 extremities Skin: No rashes, lesions or ulcers Psychiatry: unable to assess  Data Reviewed: I have personally reviewed following labs and imaging studies  CBC: Recent Labs  Lab 04/04/19 0545 04/06/19 0309 04/07/19 0517 04/08/19 0453  WBC 9.0 11.1* 11.5* 12.7*  NEUTROABS 4.7 6.2 6.0 6.5  HGB 8.6* 9.0* 9.2* 9.3*  HCT 29.7* 30.7* 32.5* 32.8*  MCV 90.3 90.0 90.8 91.1  PLT 172 179 204 476   Basic Metabolic Panel: Recent Labs  Lab 04/03/19 0900 04/04/19 0545 04/06/19 0309 04/07/19 0517 04/08/19 0718  NA 139 145 149* 149* 154*  K 4.2 4.2 4.4 5.1 5.1  CL 100 99 101 105 106  CO2 26 30 33* 34* 33*  GLUCOSE 103* 115* 119* 104* 119*  BUN 95* 95* 98* 110* 134*  CREATININE 2.05* 1.98* 2.15* 2.52* 3.50*  CALCIUM 8.4* 8.8* 9.2 9.2 9.4  MG  --  2.5*  --   --   --    GFR: Estimated Creatinine Clearance: 12.7 mL/min (A) (by C-G formula based on SCr  of 3.5 mg/dL (H)). Liver Function Tests: Recent Labs  Lab 04/04/19 0545 04/06/19 0309 04/07/19 0517 04/08/19 0718  AST 77* 58* 68* PENDING  ALT 83* 59* 52* 199*  ALKPHOS 161* 120 121 203*  BILITOT 0.3 0.6 1.2 1.6*  PROT 6.6 6.8 7.3 7.0  ALBUMIN 3.1* 3.1* 3.2* 3.3*   No results for input(s): LIPASE, AMYLASE in the last 168 hours. Recent Labs  Lab 04/04/19 0545   AMMONIA 21   Coagulation Profile: No results for input(s): INR, PROTIME in the last 168 hours. Cardiac Enzymes: No results for input(s): CKTOTAL, CKMB, CKMBINDEX, TROPONINI in the last 168 hours. BNP (last 3 results) No results for input(s): PROBNP in the last 8760 hours. HbA1C: No results for input(s): HGBA1C in the last 72 hours. CBG: No results for input(s): GLUCAP in the last 168 hours. Lipid Profile: No results for input(s): CHOL, HDL, LDLCALC, TRIG, CHOLHDL, LDLDIRECT in the last 72 hours. Thyroid Function Tests: No results for input(s): TSH, T4TOTAL, FREET4, T3FREE, THYROIDAB in the last 72 hours. Anemia Panel: No results for input(s): VITAMINB12, FOLATE, FERRITIN, TIBC, IRON, RETICCTPCT in the last 72 hours. Sepsis Labs: No results for input(s): PROCALCITON, LATICACIDVEN in the last 168 hours.  Recent Results (from the past 240 hour(s))  Respiratory Panel by RT PCR (Flu A&B, Covid) - Nasopharyngeal Swab     Status: None   Collection Time: 03/30/19 11:23 AM   Specimen: Nasopharyngeal Swab  Result Value Ref Range Status   SARS Coronavirus 2 by RT PCR NEGATIVE NEGATIVE Final    Comment: (NOTE) SARS-CoV-2 target nucleic acids are NOT DETECTED. The SARS-CoV-2 RNA is generally detectable in upper respiratoy specimens during the acute phase of infection. The lowest concentration of SARS-CoV-2 viral copies this assay can detect is 131 copies/mL. A negative result does not preclude SARS-Cov-2 infection and should not be used as the sole basis for treatment or other patient management decisions. A negative result may occur with  improper specimen collection/handling, submission of specimen other than nasopharyngeal swab, presence of viral mutation(s) within the areas targeted by this assay, and inadequate number of viral copies (<131 copies/mL). A negative result must be combined with clinical observations, patient history, and epidemiological information. The expected result  is Negative. Fact Sheet for Patients:  https://www.moore.com/ Fact Sheet for Healthcare Providers:  https://www.young.biz/ This test is not yet ap proved or cleared by the [place] FDA and  has been authorized for detection and/or diagnosis of SARS-CoV-2 by FDA under an Emergency Use Authorization (EUA). This EUA will remain  in effect (meaning this test can be used) for the duration of the COVID-19 declaration under Section 564(b)(1) of the Act, 21 U.S.C. section 360bbb-3(b)(1), unless the authorization is terminated or revoked sooner.    Influenza A by PCR NEGATIVE NEGATIVE Final   Influenza B by PCR NEGATIVE NEGATIVE Final    Comment: (NOTE) The Xpert Xpress SARS-CoV-2/FLU/RSV assay is intended as an aid in  the diagnosis of influenza from Nasopharyngeal swab specimens and  should not be used as a sole basis for treatment. Nasal washings and  aspirates are unacceptable for Xpert Xpress SARS-CoV-2/FLU/RSV  testing. Fact Sheet for Patients: https://www.moore.com/ Fact Sheet for Healthcare Providers: https://www.young.biz/ This test is not yet approved or cleared by the [place] FDA and  has been authorized for detection and/or diagnosis of SARS-CoV-2 by  FDA under an Emergency Use Authorization (EUA). This EUA will remain  in effect (meaning this test can be used) for the duration of the  Covid-19 declaration under Section 564(b)(1) of the Act, 21  U.S.C. section 360bbb-3(b)(1), unless the authorization is  terminated or revoked. Performed at [facility], [address], [place], [place] [address]   Urine culture     Status: Abnormal   Collection Time: 03/30/19  9:38 PM   Specimen: Urine, Random  Result Value Ref Range Status   Specimen Description URINE, RANDOM  Final   Special Requests   Final    NONE Performed at [facility], [address], [place], [place] [address]     Culture >=100,000 COLONIES/mL ESCHERICHIA COLI (A)  Final   Report Status 04/02/2019 FINAL  Final   Organism ID, Bacteria ESCHERICHIA COLI (A)  Final      Susceptibility   Escherichia coli - MIC*    AMPICILLIN 8 SENSITIVE Sensitive     CEFAZOLIN <=4 SENSITIVE Sensitive     CEFTRIAXONE <=0.25 SENSITIVE Sensitive     CIPROFLOXACIN <=0.25 SENSITIVE Sensitive     GENTAMICIN <=1 SENSITIVE Sensitive     IMIPENEM <=0.25 SENSITIVE Sensitive     NITROFURANTOIN <=16 SENSITIVE Sensitive     TRIMETH/SULFA <=20 SENSITIVE Sensitive     AMPICILLIN/SULBACTAM 4 SENSITIVE Sensitive     PIP/TAZO <=4 SENSITIVE Sensitive     * >=100,000 COLONIES/mL ESCHERICHIA COLI         Radiology Studies: DG CHEST PORT 1 VIEW  Result Date: 04/06/2019 CLINICAL DATA:  Cough EXAM: PORTABLE CHEST 1 VIEW COMPARISON:  04/04/2019 FINDINGS: Cardiomegaly with vascular congestion. Bilateral airspace disease, most pronounced in the lower lobes, right greater than left. This is progressed since prior study. Small right pleural effusion, also slightly enlarged since prior study. IMPRESSION: Bilateral lower lobe airspace opacities, right greater than left with small right effusion. Findings worsening since prior study. Electronically Signed   By: Charlett Nose M.D.   On: 04/06/2019 19:06        Scheduled Meds: . acetaminophen  1,000 mg Oral BID  . aspirin EC  81 mg Oral Daily  . atorvastatin  20 mg Oral Daily  . carvedilol  3.125 mg Oral BID WC  . cephALEXin  500 mg Oral Q12H  . clopidogrel  75 mg Oral Daily  . feeding supplement (ENSURE ENLIVE)  237 mL Oral TID BM  . folic acid  3 mg Oral Daily  . heparin  5,000 Units Subcutaneous Q12H  . levothyroxine  100 mcg Oral QAC breakfast  . multivitamin with minerals  1 tablet Oral Daily  . predniSONE  5 mg Oral Daily  . sodium chloride flush  3 mL Intravenous Q12H  . vitamin B-12  1,000 mcg Oral Daily   Continuous Infusions: . sodium chloride 250 mL (04/02/19 1012)      LOS: 9 days   Time spent: 25  Myrlene Broker, MD [facility] Pager [phone]  If 7PM-7AM, please contact night-coverage [email or web address] Password [email or web address] 04/08/2019, 8:30 AM

## 2019-04-09 DIAGNOSIS — F419 Anxiety disorder, unspecified: Secondary | ICD-10-CM

## 2019-04-09 DIAGNOSIS — R0602 Shortness of breath: Secondary | ICD-10-CM

## 2019-04-09 LAB — COMPREHENSIVE METABOLIC PANEL
ALT: 195 U/L — ABNORMAL HIGH (ref 0–44)
AST: 267 U/L — ABNORMAL HIGH (ref 15–41)
Albumin: 3.2 g/dL — ABNORMAL LOW (ref 3.5–5.0)
Alkaline Phosphatase: 262 U/L — ABNORMAL HIGH (ref 38–126)
Anion gap: 17 — ABNORMAL HIGH (ref 5–15)
BUN: 158 mg/dL — ABNORMAL HIGH (ref 8–23)
CO2: 31 mmol/L (ref 22–32)
Calcium: 9.1 mg/dL (ref 8.9–10.3)
Chloride: 109 mmol/L (ref 98–111)
Creatinine, Ser: 4.17 mg/dL — ABNORMAL HIGH (ref 0.61–1.24)
GFR calc Af Amer: 14 mL/min — ABNORMAL LOW (ref >60)
GFR calc non Af Amer: 12 mL/min — ABNORMAL LOW (ref >60)
Glucose, Bld: 109 mg/dL — ABNORMAL HIGH (ref 70–99)
Potassium: 5.1 mmol/L (ref 3.5–5.1)
Sodium: 157 mmol/L — ABNORMAL HIGH (ref 135–145)
Total Bilirubin: 1.4 mg/dL — ABNORMAL HIGH (ref 0.3–1.2)
Total Protein: 7 g/dL (ref 6.5–8.1)

## 2019-04-09 LAB — CBC
HCT: 32.7 % — ABNORMAL LOW (ref 39.0–52.0)
Hemoglobin: 9.1 g/dL — ABNORMAL LOW (ref 13.0–17.0)
MCH: 25.7 pg — ABNORMAL LOW (ref 26.0–34.0)
MCHC: 27.8 g/dL — ABNORMAL LOW (ref 30.0–36.0)
MCV: 92.4 fL (ref 80.0–100.0)
Platelets: 194 10*3/uL (ref 150–400)
RBC: 3.54 MIL/uL — ABNORMAL LOW (ref 4.22–5.81)
RDW: 18.6 % — ABNORMAL HIGH (ref 11.5–15.5)
WBC: 12.9 10*3/uL — ABNORMAL HIGH (ref 4.0–10.5)
nRBC: 2.1 % — ABNORMAL HIGH (ref 0.0–0.2)

## 2019-04-09 LAB — URINE CULTURE: Culture: NO GROWTH

## 2019-04-09 MED ORDER — HALOPERIDOL LACTATE 5 MG/ML IJ SOLN: 2.0000 mg | Freq: Four times a day (QID) | INTRAMUSCULAR | Status: DC | PRN

## 2019-04-09 MED ORDER — HYDROMORPHONE HCL 1 MG/ML IJ SOLN
0.5000 mg | INTRAMUSCULAR | Status: DC | PRN
  Administered 2019-04-09 – 2019-04-10 (×4): 0.5 mg via INTRAVENOUS
  Filled 2019-04-09 (×4): qty 0.5

## 2019-04-09 MED ORDER — GLYCOPYRROLATE 0.2 MG/ML IJ SOLN
0.2000 mg | INTRAMUSCULAR | Status: DC | PRN
  Filled 2019-04-09: qty 1

## 2019-04-09 MED ORDER — LORAZEPAM 2 MG/ML IJ SOLN
0.5000 mg | Freq: Four times a day (QID) | INTRAMUSCULAR | Status: DC | PRN
  Administered 2019-04-10: 0.5 mg via INTRAVENOUS
  Filled 2019-04-09: qty 1

## 2019-04-09 NOTE — Progress Notes (Signed)
CSW made referral to [facility] for residential hospice they report no beds today possibly tomorrow.   [place], [place] [phone]

## 2019-04-09 NOTE — Progress Notes (Signed)
PROGRESS NOTE    Hunter Choi  KGU:[id number] DOB: 09/03/1927 DOA: 03/30/2019 PCP: [name], MD   Brief Narrative: 86 year old with past medical history significant for CAD/CABG, diastolic congestive heart failure, aortic stenosis status post aortic valve replacement, paroxysmal A. fib, AAA, rheumatoid arthritis on chronic methotrexate and prednisone, CVA with left-sided deficit who presents to the ED on 03/30/2019 with complaints of shortness of breath and weight gain and altered mental status, ongoing for 2 weeks, failure to respond to outpatient escalation in diuretic therapy with Lasix, patient's son is primary caretaker.  Patient was admitted with acute on chronic systolic heart failure exacerbation, acute hypoxic respiratory failure, transaminitis from liver congestion from heart failure, elevation of troponin secondary to demand ischemia.  Cardiology was consulted who recommended initiation of IV heparin for Lexis.  Echocardiogram show ejection fraction 25 to 30%.  Patient failed to improve to aggressive management of heart failure, history of function got worse, he developed hypernatremia from poor oral intake, encephalopathy, delirium. Palliative care was consulted and after meeting with family decision was to proceed with full comfort care and transition to hospice care.    Assessment & Plan:   Active Problems:   Shortness of breath   CHF (congestive heart failure) (HCC)   Palliative care by specialist   Terminal care   Creatinine elevation   Anxiety   1-acute on chronic systolic heart failure with acute hypoxic respiratory failure: Ejection fraction 25 to 30%. Patient was treated with IV Lasix, IV Heparin, Coreg. Patient failed to improve with medical management. Cardiology recommended palliative care consultation. Family discussed with palliative care, plan is to transition to comfort care.  2-transaminitis: Likely from liver congestion from heart  failure. Ammonia level was unremarkable.  3-Nonsustained V. tach: Initially unable to tolerate Coreg due to hypotension, subsequently was resumed. 4-CAD status post CABG: Patient was on aspirin, Plavix and statin.  Acute on chronic renal failure stage IIIb at baseline/E. coli UTI Treated with Rocephin.  Completed course of antibiotics. Renal function failed to improve likely cardiorenal syndrome. Now comfort care.  Encephalopathy/delirium: Vascular dementia: Has been more confused than usual.  Likely multifactorial.  Esophageal dysphagia: Evaluated by speech who recommended nectar thick. Patient with poor oral intake, and high risk for aspiration.  Hyper natremia: Unable to give IV fluids due to heart failure.  Poor oral intake.  RA: Methotrexate on hold due to abnormal liver function test.  Continue with prednisone.  Hypothyroidism: Continue with Synthroid.  Pressure ulcer stage I sacrum: Present on admission: Local care Pressure Ulcer 01/27/15 Stage I -  Intact skin with non-blanchable redness of a localized area usually over a bony prominence. redness. no open areas (Active)  01/27/15 1249  Location: Sacrum  Location Orientation: Left;Right  Staging: Stage I -  Intact skin with non-blanchable redness of a localized area usually over a bony prominence.  Wound Description (Comments): redness. no open areas  Present on Admission: Yes     Nutrition Problem: Increased nutrient needs Etiology: chronic illness(CHF)    Signs/Symptoms: estimated needs    Interventions: Ensure Enlive (each supplement provides 350kcal and 20 grams of protein), MVI  Estimated body mass index is 22.37 kg/m as calculated from the following:   Height as of this encounter: 5\' 7"  (1.702 m).   Weight as of this encounter: 64.8 kg.   DVT prophylaxis: Heparin Code Status: DNR Family Communication: Palliative updated son Disposition Plan: Transition to residential hospice when bed  available Consultants:   Cardiology  Palliative care  Procedures:     Antimicrobials:    Subjective: Patient alert, is mild, will take a few sips of water with thickening and then start coughing.  Objective: Vitals:   04/08/19 1646 04/08/19 2006 04/09/19 0510 04/09/19 0938  BP: (!) 102/51 93/76 (!) 91/53 94/60  Pulse: 72 84 73 61  Resp:  20 18   Temp:  (!) 97.5 F (36.4 C) (!) 97 F (36.1 C)   TempSrc:      SpO2:  94% 91%   Weight:   64.8 kg   Height:        Intake/Output Summary (Last 24 hours) at 04/09/2019 1358 Last data filed at 04/09/2019 0824 Gross per 24 hour  Intake 120 ml  Output 310 ml  Net -190 ml   Filed Weights   04/07/19 0112 04/08/19 0614 04/09/19 0510  Weight: 65.7 kg 62.9 kg 64.8 kg    Examination:  General exam: Appears calm and comfortable  Respiratory system: Crackles at the bases respiratory effort normal. Cardiovascular system: S1 & S2 heard, RRR. No JVD, murmurs, rubs, gallops or clicks. No pedal edema. Gastrointestinal system: Abdomen is nondistended, soft and nontender. No organomegaly or masses felt. Normal bowel sounds heard. Central nervous system: Alert Extremities: Symmetric 5 x 5 power. Skin: No rashes, lesions or ulcers    Data Reviewed: I have personally reviewed following labs and imaging studies  CBC: Recent Labs  Lab 04/04/19 0545 04/06/19 0309 04/07/19 0517 04/08/19 0453 04/09/19 0624  WBC 9.0 11.1* 11.5* 12.7* 12.9*  NEUTROABS 4.7 6.2 6.0 6.5  --   HGB 8.6* 9.0* 9.2* 9.3* 9.1*  HCT 29.7* 30.7* 32.5* 32.8* 32.7*  MCV 90.3 90.0 90.8 91.1 92.4  PLT 172 179 204 217 194   Basic Metabolic Panel: Recent Labs  Lab 04/04/19 0545 04/06/19 0309 04/07/19 0517 04/08/19 0718 04/09/19 0624  NA 145 149* 149* 154* 157*  K 4.2 4.4 5.1 5.1 5.1  CL 99 101 105 106 109  CO2 30 33* 34* 33* 31  GLUCOSE 115* 119* 104* 119* 109*  BUN 95* 98* 110* 134* 158*  CREATININE 1.98* 2.15* 2.52* 3.50* 4.17*  CALCIUM 8.8* 9.2 9.2  9.4 9.1  MG 2.5*  --   --   --   --    GFR: Estimated Creatinine Clearance: 11 mL/min (A) (by C-G formula based on SCr of 4.17 mg/dL (H)). Liver Function Tests: Recent Labs  Lab 04/04/19 0545 04/06/19 0309 04/07/19 0517 04/08/19 0718 04/09/19 0624  AST 77* 58* 68* 325* 267*  ALT 83* 59* 52* 199* 195*  ALKPHOS 161* 120 121 203* 262*  BILITOT 0.3 0.6 1.2 1.6* 1.4*  PROT 6.6 6.8 7.3 7.0 7.0  ALBUMIN 3.1* 3.1* 3.2* 3.3* 3.2*   No results for input(s): LIPASE, AMYLASE in the last 168 hours. Recent Labs  Lab 04/04/19 0545  AMMONIA 21   Coagulation Profile: No results for input(s): INR, PROTIME in the last 168 hours. Cardiac Enzymes: No results for input(s): CKTOTAL, CKMB, CKMBINDEX, TROPONINI in the last 168 hours. BNP (last 3 results) No results for input(s): PROBNP in the last 8760 hours. HbA1C: No results for input(s): HGBA1C in the last 72 hours. CBG: No results for input(s): GLUCAP in the last 168 hours. Lipid Profile: No results for input(s): CHOL, HDL, LDLCALC, TRIG, CHOLHDL, LDLDIRECT in the last 72 hours. Thyroid Function Tests: No results for input(s): TSH, T4TOTAL, FREET4, T3FREE, THYROIDAB in the last 72 hours. Anemia Panel: No results for input(s): VITAMINB12, FOLATE, FERRITIN, TIBC, IRON, RETICCTPCT  in the last 72 hours. Sepsis Labs: No results for input(s): PROCALCITON, LATICACIDVEN in the last 168 hours.  Recent Results (from the past 240 hour(s))  Urine culture     Status: Abnormal   Collection Time: 03/30/19  9:38 PM   Specimen: Urine, Random  Result Value Ref Range Status   Specimen Description URINE, RANDOM  Final   Special Requests   Final    NONE Performed at [facility], [address] [place], [place] [address]    Culture >=100,000 COLONIES/mL ESCHERICHIA COLI (A)  Final   Report Status 04/02/2019 FINAL  Final   Organism ID, Bacteria ESCHERICHIA COLI (A)  Final      Susceptibility   Escherichia coli - MIC*    AMPICILLIN 8 SENSITIVE  Sensitive     CEFAZOLIN <=4 SENSITIVE Sensitive     CEFTRIAXONE <=0.25 SENSITIVE Sensitive     CIPROFLOXACIN <=0.25 SENSITIVE Sensitive     GENTAMICIN <=1 SENSITIVE Sensitive     IMIPENEM <=0.25 SENSITIVE Sensitive     NITROFURANTOIN <=16 SENSITIVE Sensitive     TRIMETH/SULFA <=20 SENSITIVE Sensitive     AMPICILLIN/SULBACTAM 4 SENSITIVE Sensitive     PIP/TAZO <=4 SENSITIVE Sensitive     * >=100,000 COLONIES/mL ESCHERICHIA COLI  Culture, Urine     Status: None   Collection Time: 04/08/19  3:49 PM   Specimen: Urine, Random  Result Value Ref Range Status   Specimen Description URINE, RANDOM  Final   Special Requests NONE  Final   Culture   Final    NO GROWTH Performed at [facility], [address], [place], [place] [address]    Report Status 04/09/2019 FINAL  Final         Radiology Studies: DG Swallowing Func-Speech Pathology  Result Date: 04/08/2019 Objective Swallowing Evaluation: Type of Study: MBS-Modified Barium Swallow Study  Patient Details Name: HADDEN KAUFER MRN: [id number] Date of Birth: [birth year] Today's Date: 04/08/2019 Time: SLP Start Time (ACUTE ONLY): 0930 -SLP Stop Time (ACUTE ONLY): 1000 SLP Time Calculation (min) (ACUTE ONLY): 30 min Past Medical History: Past Medical History: Diagnosis Date . AAA (abdominal aortic aneurysm) (HCC)   Dr. Ashley Royalty . Aortic stenosis, severe   a. s/p TAVR . Blind left eye  . BPH (benign prostatic hypertrophy)   Dr. Retta Diones . CAD (coronary artery disease) of bypass graft   Chronic occlusion of saphenous vein graft to RCA . Coronary artery disease   Left main disease and severe 3-vessel CAD . Diastolic heart failure (HCC)  . Embolism involving retinal artery  . GERD (gastroesophageal reflux disease)  . Hard of hearing  . Hypertension  . Hypothyroidism  . Iliac artery aneurysm (HCC)   Dr. Edilia Bo . PAF (paroxysmal atrial fibrillation) (HCC)  . S/P TAVR (transcatheter aortic valve replacement) 01/15/2013  a. 12/2012: mm Stephannie Peters XT transcatheter heart valve placed via transapical approach . Stroke [facility])   mini stroke effective his eye left Past Surgical History: Past Surgical History: Procedure Laterality Date . BIOPSY PROSTATE  2005 . BUNIONECTOMY WITH HAMMERTOE RECONSTRUCTION Bilateral  . CARDIAC CATHETERIZATION   . CARDIAC CATHETERIZATION N/A 05/07/2015  Procedure: Right Heart Cath and Coronary/Graft Angiography;  Surgeon: Lyn Records, MD;  Location: [facility] INVASIVE CV LAB;  Service: Cardiovascular;  Laterality: N/A; . CAROTID ENDARTERECTOMY Right Jan. 2, 1997 . CATARACT EXTRACTION W/ INTRAOCULAR LENS IMPLANT Right  . CHOLECYSTECTOMY N/A 01/28/2015  Procedure: LAPAROSCOPIC CHOLECYSTECTOMY WITH INTRAOPERATIVE CHOLANGIOGRAM;  Surgeon: Gaynelle Adu, MD;  Location:  [facility] OR;  Service: General;  Laterality: N/A; . COLONOSCOPY   . CORONARY ARTERY BYPASS GRAFT  01/1998  Dr. Sheliah Plane; LIMA to LAD, SVG to D1, SVG to LCx, SVG to RCA, open saphenous vein harvest via right lower extremity . HARVEST BONE GRAFT  1982  FOR THE  ANKLE . HEMIARTHROPLASTY HIP Left   AFTER HIP FX . INTRAOPERATIVE TRANSESOPHAGEAL ECHOCARDIOGRAM N/A 01/15/2013  Procedure: INTRAOPERATIVE TRANSESOPHAGEAL ECHOCARDIOGRAM;  Surgeon: [name], MD;  Location: [facility] OR;  Service: Open Heart Surgery;  Laterality: N/A; . JOINT REPLACEMENT   . LEFT AND RIGHT HEART CATHETERIZATION WITH CORONARY ANGIOGRAM N/A 12/20/2012  Procedure: LEFT AND RIGHT HEART CATHETERIZATION WITH CORONARY ANGIOGRAM;  Surgeon: Lesleigh Noe, MD;  Location: [facility] CATH LAB;  Service: Cardiovascular;  Laterality: N/A; . LEFT HEART CATH AND CORS/GRAFTS ANGIOGRAPHY N/A 02/14/2017  Procedure: LEFT HEART CATH AND CORS/GRAFTS ANGIOGRAPHY;  Surgeon: Marykay Lex, MD;  Location: [facility] INVASIVE CV LAB;  Service: Cardiovascular;  Laterality: N/A; . SKIN GRAFTS  1968 . TOTAL HIP ARTHROPLASTY Left 2007 . TOTAL KNEE ARTHROPLASTY  1992-2002  right-left . TRANSCATHETER AORTIC VALVE REPLACEMENT, TRANSAPICAL N/A  01/15/2013  Procedure: TRANSCATHETER AORTIC VALVE REPLACEMENT, TRANSAPICAL;  Surgeon: [name], MD;  Location: [facility] OR;  Service: Open Heart Surgery;  Laterality: N/A; . TRANSURETHRAL RESECTION OF PROSTATE  2012 HPI: 86 y.o. male with medical history significant of CAD, s/p TARV, CHF, CVA, hypothyroidism who presents to ER for worsening dyspnea for one week. CXR 1/9: "Bilateral lower lobe airspace opacities, right greater than leftwith small right effusion. Findings worsening since prior study"  Subjective: Pt seen in radiology for MBS Assessment / Plan / Recommendation CHL IP CLINICAL IMPRESSIONS 04/08/2019 Clinical Impression Prior to initiating po presentations for MBS, pt was noted to have dry oral cavity with bright green residue, possibly medication. Oral care was completed with suction, with minimal clearing, due to poor participation. Pt presents with mild-moderate oropharyngeal dysphagia, characterized as follows: ORALLY, pt exhibits slow oral prep, poor bolus formation and prematures spillage over the tongue base. Extended and disorganized oral prep was noted on dys2 (soft, chopped) textures. Diffuse oral residue noted after the swallow on each consistency. PHARYNGEAL swallow is characterized by reflex trigger at the level of the vallecula on honey thick liquids, puree, and dys 2 (soft chopped) textures. Trigger noted at the pyriform sinus on nectar thick liquids. Penetration BEFORE the swallow was noted on nectar thick liquids via cup sip, however, no penetration was seen on nectar thick liquids via teaspoon. Post-swallow residue was noted in the vallecula, pyriform, and lateral channels after the swallow. Thin liquids and regular solids were not presented at this time, due to aspiration risk. Esophageal sweep revealed it slow to clear, however, no backflow into the pharynx was noted (as seen on February 2020 MBS). At this time, recommend puree diet and honey thick liquid via cup sip, meds crushed in  puree, with 1:1 assist for all po intake. Pt may have teaspoon boluses of nectar thick liquid, however, cup sips of nectar thick liquid are NOT RECOMMENDED due to aspiration risk.  Pt may have small bites of Dys2 consistencies for oral pleasure or snacks, but puree textures are recommended for primary nutrition for energy conservation. Safe swallow precautions were sent with transport to be posted at [facility] SLP will follow up for education, hopefully with family, as pt is unable to comprehend and retain new information. MD and RN notified of results and recommendations.  SLP Visit Diagnosis  Dysphagia, oropharyngeal phase (R13.12)     Impact on safety and function Moderate aspiration risk;Risk for inadequate nutrition/hydration   CHL IP TREATMENT RECOMMENDATION 04/08/2019 Treatment Recommendations Therapy as outlined in treatment plan below   Prognosis 04/08/2019 Prognosis for Safe Diet Advancement Fair Barriers to Reach Goals Cognitive deficits;Time post onset;Severity of deficits CHL IP DIET RECOMMENDATION 04/08/2019 SLP Diet Recommendations Dysphagia 1 (Puree) solids;Honey thick liquids Liquid Administration via Cup;No straw Medication Administration Crushed with puree Compensations Minimize environmental distractions;Slow rate;Small sips/bites Postural Changes Remain semi-upright after after feeds/meals (Comment);Seated upright at 90 degrees   CHL IP OTHER RECOMMENDATIONS 04/08/2019 Recommended Consults Consider esophageal assessment Oral Care Recommendations Oral care QID;Staff/trained caregiver to provide oral care Other Recommendations Order thickener from pharmacy;Have oral suction available   CHL IP FOLLOW UP RECOMMENDATIONS 04/08/2019 Follow up Recommendations Skilled Nursing facility;24 hour supervision/assistance   CHL IP FREQUENCY AND DURATION 04/08/2019 Speech Therapy Frequency (ACUTE ONLY) min 1 x/week Treatment Duration 1 week;2 weeks      CHL IP ORAL PHASE 04/08/2019 Oral Phase Impaired   Oral - Honey Cup  Reduced posterior propulsion;Lingual/palatal residue;Delayed oral transit;Decreased bolus cohesion;Premature spillage   Oral - Nectar Cup Reduced posterior propulsion;Lingual/palatal residue;Delayed oral transit;Decreased bolus cohesion;Premature spillage   Oral - Puree Reduced posterior propulsion;Lingual/palatal residue;Delayed oral transit;Decreased bolus cohesion;Premature spillage Oral - Mech Soft Reduced posterior propulsion;Lingual/palatal residue;Delayed oral transit;Decreased bolus cohesion;Premature spillage    CHL IP PHARYNGEAL PHASE 04/08/2019 Pharyngeal Phase Impaired   Pharyngeal- Honey Cup, Teaspoon Delayed swallow initiation-vallecula;Reduced pharyngeal peristalsis;Reduced epiglottic inversion;Reduced anterior laryngeal mobility;Reduced laryngeal elevation;Reduced tongue base retraction;Pharyngeal residue - valleculae;Pharyngeal residue - pyriform   Pharyngeal- Nectar Teaspoon No penetration noted with limited bolus size via teaspoon   Pharyngeal- Nectar Cup Delayed swallow initiation-vallecula;Delayed swallow initiation-pyriform sinuses;Reduced pharyngeal peristalsis;Reduced epiglottic inversion;Reduced anterior laryngeal mobility;Reduced laryngeal elevation;Reduced airway/laryngeal closure;Reduced tongue base retraction;Penetration/Aspiration before swallow;Penetration/Aspiration during swallow;Pharyngeal residue - valleculae;Pharyngeal residue - pyriform Pharyngeal Material enters airway, remains ABOVE vocal cords and not ejected out   Pharyngeal- Puree Delayed swallow initiation-vallecula;Reduced pharyngeal peristalsis;Reduced epiglottic inversion;Reduced anterior laryngeal mobility;Reduced laryngeal elevation;Reduced tongue base retraction;Pharyngeal residue - valleculae;Pharyngeal residue - pyriform   Pharyngeal- Mechanical Soft Delayed swallow initiation-vallecula;Reduced pharyngeal peristalsis;Reduced epiglottic inversion;Reduced anterior laryngeal mobility;Reduced laryngeal elevation;Reduced  tongue base retraction;Pharyngeal residue - valleculae;Pharyngeal residue - pyriform    CHL IP CERVICAL ESOPHAGEAL PHASE 04/08/2019 Cervical Esophageal Phase Darryll Capers Enriqueta Shutter, MSP, CCC-SLP Speech Language Pathologist Office: [phone] Pager: [phone] Shonna Chock 04/08/2019, 10:59 AM                   Scheduled Meds: . predniSONE  5 mg Oral Daily  . sodium chloride flush  3 mL Intravenous Q12H   Continuous Infusions: . sodium chloride 250 mL (04/02/19 1012)     LOS: 10 days    Time spent: 35 minutes.     Elmarie Shiley, MD [facility]   If 7PM-7AM, please contact night-coverage [email or web address] Password [facility] 04/09/2019, 1:58 PM

## 2019-04-09 NOTE — Plan of Care (Signed)
  Problem: Education: Goal: Knowledge of General Education information will improve Description: Including pain rating scale, medication(s)/side effects and non-pharmacologic comfort measures Outcome: Progressing   Problem: Health Behavior/Discharge Planning: Goal: Ability to manage health-related needs will improve Outcome: Progressing   Problem: Pain Managment: Goal: General experience of comfort will improve Outcome: Progressing   Problem: Pain Managment: Goal: General experience of comfort will improve Outcome: Progressing   Problem: Safety: Goal: Ability to remain free from injury will improve Outcome: Progressing

## 2019-04-09 NOTE — Progress Notes (Signed)
Nutrition Brief Note  Chart reviewed. Pt now transitioning to comfort care; per palliative care notes, plan for residential hospice placement. No further nutrition interventions warranted at this time.  Please re-consult as needed.   Ezekiah Massie A. Mayford Knife, RD, LDN, CDCES Registered Dietitian II Certified Diabetes Care and Education Specialist Pager: [phone] After hours Pager: [phone]

## 2019-04-09 NOTE — Progress Notes (Signed)
Daily Progress Note   Patient Name: Hunter Choi       Date: 04/09/2019 DOB: 1928-01-15  Age: 86 y.o. MRN#: [id number] Attending Physician: Elmarie Shiley, MD Primary Care Physician: Leeroy Cha, MD Admit Date: 03/30/2019  Reason for Consultation/Follow-up: Establishing goals of care  Subjective: Patient with intermittent episodes of moaning and dyspnea. He denies pain but continues to moan when at bedside. He ate bites for breakfast. He is disoriented and very hard of hearing. Creatinine worsening.  GOC: F/u with son, [name] via telephone. Again discussed course of hospitalization including diagnoses, interventions, plan of care and unfortunate continued decline of kidney function. Explained that his father appears uncomfortable with moaning and shortness of breath. [name] visited yesterday evening and shares that his father was moaning and disoriented at that time too. [name] is not surprised by this information and him and his brother are prepared for transition to comfort and hospice facility.   Educated on hospice philosophy and facility, which [name] prefers. Educated on focus including comfort, quality, dignity and symptom management at EOL. Frankly and compassionately explained poor prognosis. Discussed comfort feeds. [name] understands his father is nearing EOL and wishes for comfort. [name] is ready for hospice facility referral today. Order placed.   Answered questions and concerns. [name] has PMT contact information.    Length of Stay: 10  Current Medications: Scheduled Meds:  . predniSONE  5 mg Oral Daily  . sodium chloride flush  3 mL Intravenous Q12H    Continuous Infusions: . sodium chloride 250 mL (04/02/19 1012)    PRN Meds: sodium chloride, acetaminophen, calcium  carbonate, fluticasone, glycopyrrolate, guaiFENesin-dextromethorphan, haloperidol lactate, HYDROmorphone (DILAUDID) injection, LORazepam, nitroGLYCERIN, ondansetron (ZOFRAN) IV, Resource ThickenUp Clear, sodium chloride flush  Physical Exam Vitals and nursing note reviewed.  Constitutional:      Appearance: He is ill-appearing.  HENT:     Head: Normocephalic and atraumatic.  Cardiovascular:     Rate and Rhythm: Normal rate.  Pulmonary:     Breath sounds: Normal breath sounds.     Comments: Intermittent dyspnea at rest. RN to give prn dilaudid. Abdominal:     Tenderness: There is no abdominal tenderness.  Skin:    General: Skin is warm and dry.  Neurological:     Mental Status: He is easily aroused.  Comments: Moaning. Disoriented  Psychiatric:        Attention and Perception: He is inattentive.        Speech: Speech is delayed.        Cognition and Memory: Cognition is impaired.            Vital Signs: BP 94/60   Pulse 61   Temp (!) 97 F (36.1 C)   Resp 18   Ht 5\' 7"  (1.702 m)   Wt 64.8 kg   SpO2 91%   BMI 22.37 kg/m  SpO2: SpO2: 91 % O2 Device: O2 Device: Room Air O2 Flow Rate: O2 Flow Rate (L/min): 1 L/min  Intake/output summary:   Intake/Output Summary (Last 24 hours) at 04/09/2019 06/07/2019 Last data filed at 04/09/2019 06/07/2019 Gross per 24 hour  Intake 120 ml  Output 310 ml  Net -190 ml   LBM: Last BM Date: 04/07/19 Baseline Weight: Weight: 68.4 kg Most recent weight: Weight: 64.8 kg       Palliative Assessment/Data: PPS 20%    Flowsheet Rows     Most Recent Value  Intake Tab  Referral Department  Hospitalist  Unit at Time of Referral  Cardiac/Telemetry Unit  Palliative Care Primary Diagnosis  Cardiac  Date Notified  04/03/19  Palliative Care Type  New Palliative care  Reason Not Seen  Consult cancelled [Attending felt consult no longer needed]  Reason for referral  Clarify Goals of Care  Date first seen by Palliative Care  04/05/19  Clinical  Assessment  Palliative Performance Scale Score  20%  Psychosocial & Spiritual Assessment  Palliative Care Outcomes  Patient/Family meeting held?  Yes  Who was at the meeting?  son 06/03/19)  Palliative Care Outcomes  Clarified goals of care, Counseled regarding hospice, Provided end of life care assistance, Provided advance care planning, Provided psychosocial or spiritual support, Changed CPR status, Completed durable DNR, Linked to palliative care logitudinal support, ACP counseling assistance      Patient Active Problem List   Diagnosis Date Noted  . Palliative care by specialist   . Goals of care, counseling/discussion   . Creatinine elevation   . CHF (congestive heart failure) (HCC) 03/30/2019  . Short of breath on exertion   . Acute exacerbation of CHF (congestive heart failure) (HCC) 05/12/2018  . Nausea vomiting and diarrhea 04/22/2018  . B12 deficiency 03/27/2018  . Hypothyroidism 03/27/2018  . Chronic combined systolic and diastolic CHF (congestive heart failure) (HCC) 03/27/2018  . Protein-calorie malnutrition, severe 03/24/2018  . Generalized weakness   . Acute metabolic encephalopathy 03/22/2018  . CKD (chronic kidney disease) stage 3, GFR 30-59 ml/min 12/09/2017  . SIRS (systemic inflammatory response syndrome) (HCC) 08/03/2017  . Essential hypertension 08/03/2017  . Elevated troponin   . Sepsis due to urinary tract infection (HCC) 10/02/2015  . Hard of hearing   . Pressure ulcer 01/28/2015  . Acute on chronic combined systolic and diastolic congestive heart failure, NYHA class 4 (HCC) 01/27/2015  . Hyponatremia 11/21/2014  . Elevated LFTs 11/14/2014  . Anemia 05/23/2014  . Thrombocytopenia (HCC) 02/21/2014  . Malnutrition of moderate degree (HCC) 02/21/2014  . Occlusion and stenosis of carotid artery without mention of cerebral infarction 09/04/2013  . Aftercare following surgery of the circulatory system, NEC 09/04/2013  . AAA (abdominal aortic aneurysm) without  rupture (HCC) 03/06/2013  . 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 Coronary artery disease involving coronary bypass graft of native heart with angina pectoris (HCC)   . GERD (  gastroesophageal reflux disease)   . Thyroid disease   . Embolism involving retinal artery   . Iliac artery aneurysm (HCC)   . Rheumatoid arthritis (HCC) 04/22/2010    Palliative Care Assessment & Plan   Patient Profile: 86 y.o. male with past medical history of CAD, CABG, combined CHF with EF 25-30%, aortic stenosis s/p TAVR, paroxysmal atrial fib, AAA, CKD stage IIIb, rheumatoid arthritis on chronic methotrexate and prednisone, CVA  admitted on 03/30/2019 with AMS, weight gain, and shortness of breath. Hospital admission for acute on chronic CHF with EF 25-30%, acute hypoxic respiratory failure, transaminitis likely from liver congestion, acute on chronic renal failure, and ongoing encephalopathy/delirium likely baseline vascular dementia. Cardiology recommending palliative medicine consultation for goals of care  Assessment: Acute on chronic systolic heart failure with EF 25-30% Acute hypoxic respiratory failure Acute on chronic renal failure with CKD stage IIIb Transaminitis Ecoli UTI Nonsustained v.tach CAD s/p hx of CABG Encephalopathy/delirium likely underlying vascular dementia Esophageal dysphagia  Recommendations/Plan:  Electronic MOST form completed with son on 04/05/19. DNR/DNI.  Patient with continued clinical decline and worsening renal function. Son ready for transition to comfort measures understanding interventions not aimed at comfort will be discontinued and no escalation of care.   Symptom management medications  Dilaudid 0.5mg  IV q3h prn pain/dyspnea/air hunger/tachypnea  Ativan 0.5mg  IV q6h prn anxiety  Haldol 2mg  IV q6h prn agitation  Robinul 0.2mg  IV q4h prn secretions  TOC order for hospice facility placement  Continue comfort feeds per patient/family  request.   Code Status: DNR   Code Status Orders  (From admission, onward)         Start     Ordered   04/05/19 1209  Do not attempt resuscitation (DNR)  Continuous    Question Answer Comment  In the event of cardiac or respiratory ARREST Do not call a "code blue"   In the event of cardiac or respiratory ARREST Do not perform Intubation, CPR, defibrillation or ACLS   In the event of cardiac or respiratory ARREST Use medication by any route, position, wound care, and other measures to relive pain and suffering. May use oxygen, suction and manual treatment of airway obstruction as needed for comfort.      04/05/19 1208        Code Status History    Date Active Date Inactive Code Status Order ID Comments User Context   03/30/2019 1310 04/05/2019 1208 Full Code 06/03/2019  [id number], MD ED   02/19/2019 0054 02/22/2019 1714 Full Code 02/24/2019  [id number], DO ED   05/12/2018 2145 05/17/2018 1924 Full Code 05/19/2018  [id number], MD ED   04/22/2018 1741 04/30/2018 1842 Full Code 06/29/2018  [id number], MD Inpatient   03/22/2018 0909 03/27/2018 2001 Full Code 03/29/2018  [id number], MD Inpatient   12/09/2017 1439 12/15/2017 2152 Full Code 2153  [id number], MD ED   08/03/2017 0626 08/07/2017 1919 Full Code 08/09/2017  [id number], MD ED   02/12/2017 2030 02/17/2017 2038 Full Code 2039  [id number], MD Inpatient   10/02/2015 1725 10/06/2015 2338 Full Code 2339  [id number], NP Inpatient   05/01/2015 1644 05/11/2015 1804 Full Code 05/13/2015  [id number], NP ED   05/01/2015 1644 05/01/2015 1644 Full Code 06/29/2015  [id number], NP ED   01/27/2015 1206 02/03/2015 1715 Full Code 13/10/2014  [id number], NP Inpatient   11/15/2014 0005 11/18/2014 2101 Full Code 2102  [id number],  DO Inpatient   05/23/2014 2045 05/25/2014 1859 Full Code [id number]  Ron Parker, MD Inpatient   02/19/2014 1221 02/22/2014 1636 Full Code  [id number]  Eddie North, MD Inpatient   01/16/2013 1533 01/22/2013 1418 Full Code [id number]  [name], MD Inpatient   01/15/2013 1136 01/16/2013 1533 Full Code [id number]  [name], MD Inpatient   Advance Care Planning Activity       Prognosis:  Likely two weeks or less with end-stage heart failure now cardiorenal syndrome with worsening kidney functions. Severe decline in functional/cognitive/nutritional status.  Discharge Planning:  Hospice facility  Care plan was discussed with RN, son [name]), Dr. Sunnie Nielsen updated via secure chat  Thank you for allowing the Palliative Medicine Team to assist in the care of this patient.   Time In: 0905 Time Out: 0935 Total Time 30 Prolonged Time Billed no      Greater than 50%  of this time was spent counseling and coordinating care related to the above assessment and plan.  Vennie Homans, DNP, FNP-C Palliative Medicine Team  Phone: [phone] Fax: [phone]  Please contact Palliative Medicine Team phone at [phone] for questions and concerns.

## 2019-04-09 NOTE — Plan of Care (Signed)

## 2019-04-10 LAB — SARS CORONAVIRUS 2 (TAT 6-24 HRS): SARS Coronavirus 2: NEGATIVE

## 2019-04-10 MED ORDER — LORAZEPAM 2 MG/ML IJ SOLN: 1.0000 mg | Freq: Four times a day (QID) | INTRAMUSCULAR | 0 refills | Status: AC | PRN

## 2019-04-10 MED ORDER — MORPHINE SULFATE 20 MG/5ML PO SOLN: 2.5000 mg | ORAL | 0 refills | Status: AC | PRN

## 2019-04-10 MED ORDER — GUAIFENESIN-DM 100-10 MG/5ML PO SYRP: 5.0000 mL | ORAL_SOLUTION | ORAL | 0 refills | Status: AC | PRN

## 2019-04-10 NOTE — Progress Notes (Signed)
Nurse called [facility] and spoke with Judeth Cornfield RN to give report.  Pt's belongings with pt: clothes, bilateral hearing aid. [facility] to transport pt.

## 2019-04-10 NOTE — Progress Notes (Signed)
[facility] room available for Mr. Hunter Choi today. Son Hunter Choi completed paperwork earlier this morning for transfer today.   Please send discharge summary to [phone].  RN please call report prior to patient leaving the unit [phone].  Thank you,  Cala Bradford [phone]

## 2019-04-10 NOTE — Care Management Important Message (Signed)
Important Message  Patient Details  Name: Hunter Choi MRN: [id number] Date of Birth: [birth year]   Medicare Important Message Given:  Yes     Renie Ora 04-27-19, 11:35 AM

## 2019-04-10 NOTE — Discharge Summary (Signed)
Physician Discharge Summary  Hunter Choi KGM:[id number] DOB: 1928-03-09 DOA: 03/30/2019  PCP: [name], MD  Admit date: 03/30/2019 Discharge date: 04-16-19  Admitted From: Home  Disposition:  Residential Hospice.   Recommendations for Outpatient Follow-up:  1. Comfort care/.   Discharge Condition: Guarded CODE STATUS: DNR Diet recommendation: comfort feeding  Brief/Interim Summary: 86 year old with past medical history significant for CAD/CABG, diastolic congestive heart failure, aortic stenosis status post aortic valve replacement, paroxysmal A. fib, AAA, rheumatoid arthritis on chronic methotrexate and prednisone, CVA with left-sided deficit who presents to the ED on 03/30/2019 with complaints of shortness of breath and weight gain and altered mental status, ongoing for 2 weeks, failure to respond to outpatient escalation in diuretic therapy with Lasix, patient's son is primary caretaker.  Patient was admitted with acute on chronic systolic heart failure exacerbation, acute hypoxic respiratory failure, transaminitis from liver congestion from heart failure, elevation of troponin secondary to demand ischemia.  Cardiology was consulted who recommended initiation of IV heparin for Lexis.  Echocardiogram show ejection fraction 25 to 30%.  Patient failed to improve to aggressive management of heart failure, history of function got worse, he developed hypernatremia from poor oral intake, encephalopathy, delirium. Palliative care was consulted and after meeting with family decision was to proceed with full comfort care and transition to hospice care.   1-acute on chronic systolic heart failure with acute hypoxic respiratory failure: Ejection fraction 25 to 30%. Patient was treated with IV Lasix, IV Heparin, Coreg. Patient failed to improve with medical management. Cardiology recommended palliative care consultation. Family discussed with palliative care, plan is to transition to  comfort care.  2-transaminitis: Likely from liver congestion from heart failure. Ammonia level was unremarkable.  3-Nonsustained V. tach: Initially unable to tolerate Coreg due to hypotension, subsequently was resumed. 4-CAD status post CABG: Patient was on aspirin, Plavix and statin.  Acute on chronic renal failure stage IIIb at baseline/E. coli UTI Treated with Rocephin.  Completed course of antibiotics. Renal function failed to improve likely cardiorenal syndrome. Now comfort care.  Encephalopathy/delirium: Vascular dementia: Has been more confused more than usual.  Likely multifactorial.  Esophageal dysphagia: Evaluated by speech who recommended nectar thick. Patient with poor oral intake, and high risk for aspiration.  Hypernatremia: Unable to give IV fluids due to heart failure.  Poor oral intake.  RA: Methotrexate on hold due to abnormal liver function test.  Continue with prednisone.  Hypothyroidism: Continue with Synthroid.  Pressure ulcer stage I sacrum: Present on admission: Local care Pressure Ulcer 01/27/15 Stage I -  Intact skin with non-blanchable redness of a localized area usually over a bony prominence. redness. no open areas (Active)  01/27/15 1249  Location: Sacrum  Location Orientation: Left;Right  Staging: Stage I -  Intact skin with non-blanchable redness of a localized area usually over a bony prominence.  Wound Description (Comments): redness. no open areas  Present on Admission: Yes     Discharge Diagnoses:  Active Problems:   Shortness of breath   CHF (congestive heart failure) (HCC)   Palliative care by specialist   Terminal care   Creatinine elevation   Anxiety    Discharge Instructions  Discharge Instructions    Diet - low sodium heart healthy   Complete by: As directed    Increase activity slowly   Complete by: As directed      Allergies as of 2019/04/16      Reactions   Ciprofloxacin Other (See Comments)   REACTION:  mild delirium  Medication List    STOP taking these medications   acetaminophen 500 MG tablet Commonly known as: TYLENOL   aspirin 81 MG EC tablet   atorvastatin 20 MG tablet Commonly known as: LIPITOR   carvedilol 6.25 MG tablet Commonly known as: COREG   clopidogrel 75 MG tablet Commonly known as: PLAVIX   feeding supplement (ENSURE ENLIVE) Liqd   folic acid 1 MG tablet Commonly known as: FOLVITE   furosemide 20 MG tablet Commonly known as: LASIX   levothyroxine 100 MCG tablet Commonly known as: SYNTHROID   losartan 25 MG tablet Commonly known as: COZAAR   methotrexate 2.5 MG tablet   multivitamin with minerals Tabs tablet   nitroGLYCERIN 0.4 MG SL tablet Commonly known as: NITROSTAT   vitamin B-12 1000 MCG tablet Commonly known as: CYANOCOBALAMIN     TAKE these medications   guaiFENesin-dextromethorphan 100-10 MG/5ML syrup Commonly known as: ROBITUSSIN DM Take 5 mLs by mouth every 4 (four) hours as needed for cough.   LORazepam 2 MG/ML injection Commonly known as: Ativan Inject 0.5 mLs (1 mg total) into the vein every 6 (six) hours as needed.   morphine 20 MG/5ML solution Take 0.6 mLs (2.4 mg total) by mouth every 2 (two) hours as needed for pain.   predniSONE 5 MG tablet Commonly known as: DELTASONE Take 5 mg by mouth daily.   TUMS PO Take 1 tablet by mouth 2 (two) times daily as needed (heartburn/indigestion).            Durable Medical Equipment  (From admission, onward)         Start     Ordered   04/02/19 1815  Heart failure home health orders  (Heart failure home health orders / Face to face)  Once    Comments: Heart Failure Follow-up Care:  Verify follow-up appointments per Patient Discharge Instructions. Confirm transportation arranged. Reconcile home medications with discharge medication list. Remove discontinued medications from use. Assist patient/caregiver to manage medications using pill box. Reinforce low sodium food  selection Assessments: Vital signs and oxygen saturation at each visit. Assess home environment for safety concerns, caregiver support and availability of low-sodium foods. Consult Child psychotherapist, PT/OT, Dietitian, and CNA based on assessments. Perform comprehensive cardiopulmonary assessment. Notify MD for any change in condition or weight gain of 3 pounds in one day or 5 pounds in one week with symptoms. Daily Weights and Symptom Monitoring: Ensure patient has access to scales. Teach patient/caregiver to weigh daily before breakfast and after voiding using same scale and record.    Teach patient/caregiver to track weight and symptoms and when to notify Provider. Activity: Develop individualized activity plan with patient/caregiver.   Question Answer Comment  Heart Failure Follow-up Care [facility] at [phone]   Home Health Visits Set up telemonitoring equipment to monitor daily vital signs, weights and oxygen saturation   Obtain the following labs Basic Metabolic Panel   Lab frequency Weekly   Fax lab results to [facility] at [phone]   Diet Low Sodium Heart Healthy   Fluid restrictions: 1200 mL Fluid   Skilled Nurse to notify MD of weight trends weekly for first 2 weeks. May fax or call: [facility] at [phone] (fax) or [phone]      04/02/19 1816          Allergies  Allergen Reactions  . Ciprofloxacin Other (See Comments)    REACTION: mild delirium    Consultations: Cardiology Palliative  Procedures/Studies: CT HEAD WO CONTRAST  Result Date: 04/04/2019 CLINICAL DATA:  Altered mental status.  Encephalopathy. EXAM: CT HEAD WITHOUT CONTRAST TECHNIQUE: Contiguous axial images were obtained from the base of the skull through the vertex without intravenous contrast. COMPARISON:  MRI 05/13/2018 FINDINGS: Brain: Generalized atrophy. Chronic small-vessel ischemic changes of the pons. Old small vessel infarction affecting both sides of the cerebellum.  Cerebral hemispheres show advanced confluent chronic small vessel ischemic changes of the white matter. There is an old right parietal cortical infarction posteriorly. Small left parietal cortical infarction at the vertex. No mass lesion, hemorrhage, hydrocephalus or extra-axial collection. Vascular: There is atherosclerotic calcification of the major vessels at the base of the brain. Skull: Normal Sinuses/Orbits: Clear/normal Other: None IMPRESSION: No acute finding by CT. Advanced generalized brain atrophy. Chronic small-vessel ischemic changes throughout the brain. Old small vessel cerebellar infarctions. Old right parietal cortical infarction. Old small left parietal vertex infarction. These were both present in February of 2020. Electronically Signed   By: Paulina Fusi M.D.   On: 04/04/2019 19:14   US RENAL  Result Date: 04/03/2019 CLINICAL DATA:  Elevated creatinine EXAM: RENAL / URINARY TRACT ULTRASOUND COMPLETE COMPARISON:  None. FINDINGS: Right Kidney: Renal measurements: 7.5 x 3.6 x 2.4 cm. = volume: 33.2 mL. Mild increased echogenicity is noted. No hydronephrosis is seen. Left Kidney: Renal measurements: 6.0 x 3.6 x 2.7 cm = volume: 30 mL. Mild increased echogenicity is noted. No hydronephrosis is noted. Bladder: Appears normal for degree of bladder distention. Other: None. IMPRESSION: Changes consistent with medical renal disease and decreased renal size. No obstructive changes are seen. Electronically Signed   By: Alcide Clever M.D.   On: 04/03/2019 22:56   DG CHEST PORT 1 VIEW  Result Date: 04/06/2019 CLINICAL DATA:  Cough EXAM: PORTABLE CHEST 1 VIEW COMPARISON:  04/04/2019 FINDINGS: Cardiomegaly with vascular congestion. Bilateral airspace disease, most pronounced in the lower lobes, right greater than left. This is progressed since prior study. Small right pleural effusion, also slightly enlarged since prior study. IMPRESSION: Bilateral lower lobe airspace opacities, right greater than left with  small right effusion. Findings worsening since prior study. Electronically Signed   By: Charlett Nose M.D.   On: 04/06/2019 19:06   DG CHEST PORT 1 VIEW  Result Date: 04/04/2019 CLINICAL DATA:  Shortness of breath. Congestive heart failure. EXAM: PORTABLE CHEST 1 VIEW COMPARISON:  Chest x-rays dated 03/31/2019, 03/30/2019 and 02/18/2019 FINDINGS: Chronic cardiomegaly. CABG. Aortic atherosclerosis. The mild bilateral pulmonary edema has almost resolved. Pulmonary vascularity is now normal. Probable tiny right pleural effusion. No acute bone abnormality. IMPRESSION: 1. Improved congestive heart failure with minimal residual pulmonary edema. 2. Tiny right pleural effusion. 3. Chronic cardiomegaly. 4.  Aortic Atherosclerosis (ICD10-I70.0). Electronically Signed   By: Francene Boyers M.D.   On: 04/04/2019 12:04   DG Chest Port 1 View  Result Date: 03/31/2019 CLINICAL DATA:  Dyspnea on exertion. EXAM: PORTABLE CHEST 1 VIEW COMPARISON:  March 30, 2019. FINDINGS: Stable cardiomegaly. Status post coronary artery bypass graft. Status post transcatheter aortic valve repair. Atherosclerosis of thoracic aorta is noted. No pneumothorax is noted. Mild bibasilar subsegmental atelectasis or edema or infiltrates are noted. No significant pleural effusion is noted. Bony thorax is unremarkable. IMPRESSION: Aortic atherosclerosis. Stable cardiomegaly. Mild bibasilar subsegmental atelectasis or infiltrates are noted concerning for edema or infiltrates. Electronically Signed   By: Lupita Raider M.D.   On: 03/31/2019 09:21   DG Chest Portable 1 View  Result Date: 03/30/2019 CLINICAL DATA:  Cp and sob, low o2.  Best obtainable due to pt condition. EXAM: PORTABLE CHEST 1 VIEW COMPARISON:  Chest radiograph 02/18/2019 FINDINGS: Stable cardiomediastinal contours status post median sternotomy and CABG. There are new mild diffuse hazy interstitial opacities which could represent trace edema or atypical infection. Chronic faint reticular  opacities at the lung bases could represent a mild fibrosis. Possible small right pleural effusion. IMPRESSION: Very mild diffuse interstitial opacities could represent trace pulmonary edema or atypical infection. Possible tiny right pleural effusion. Chronic changes at the bilateral lung bases. Electronically Signed   By: Emmaline Kluver M.D.   On: 03/30/2019 10:43   DG Swallowing Func-Speech Pathology  Result Date: 04/08/2019 Objective Swallowing Evaluation: Type of Study: MBS-Modified Barium Swallow Study  Patient Details Name: LEEON MAKAR MRN: [id number] Date of Birth: 1927/06/21 Today's Date: 04/08/2019 Time: SLP Start Time (ACUTE ONLY): 0930 -SLP Stop Time (ACUTE ONLY): 1000 SLP Time Calculation (min) (ACUTE ONLY): 30 min Past Medical History: Past Medical History: Diagnosis Date . AAA (abdominal aortic aneurysm) (HCC)   Dr. Ashley Royalty . Aortic stenosis, severe   a. s/p TAVR . Blind left eye  . BPH (benign prostatic hypertrophy)   Dr. Retta Diones . CAD (coronary artery disease) of bypass graft   Chronic occlusion of saphenous vein graft to RCA . Coronary artery disease   Left main disease and severe 3-vessel CAD . Diastolic heart failure (HCC)  . Embolism involving retinal artery  . GERD (gastroesophageal reflux disease)  . Hard of hearing  . Hypertension  . Hypothyroidism  . Iliac artery aneurysm (HCC)   Dr. Edilia Bo . PAF (paroxysmal atrial fibrillation) (HCC)  . S/P TAVR (transcatheter aortic valve replacement) 01/15/2013  a. 12/2012: mm Stephannie Peters XT transcatheter heart valve placed via transapical approach . Stroke [facility])   mini stroke effective his eye left Past Surgical History: Past Surgical History: Procedure Laterality Date . BIOPSY PROSTATE  2005 . BUNIONECTOMY WITH HAMMERTOE RECONSTRUCTION Bilateral  . CARDIAC CATHETERIZATION   . CARDIAC CATHETERIZATION N/A 05/07/2015  Procedure: Right Heart Cath and Coronary/Graft Angiography;  Surgeon: Lyn Records, MD;  Location: [facility] INVASIVE CV LAB;   Service: Cardiovascular;  Laterality: N/A; . CAROTID ENDARTERECTOMY Right Jan. 2, 1997 . CATARACT EXTRACTION W/ INTRAOCULAR LENS IMPLANT Right  . CHOLECYSTECTOMY N/A 01/28/2015  Procedure: LAPAROSCOPIC CHOLECYSTECTOMY WITH INTRAOPERATIVE CHOLANGIOGRAM;  Surgeon: Gaynelle Adu, MD;  Location: [facility] OR;  Service: General;  Laterality: N/A; . COLONOSCOPY   . CORONARY ARTERY BYPASS GRAFT  01/1998  Dr. Sheliah Plane; LIMA to LAD, SVG to D1, SVG to LCx, SVG to RCA, open saphenous vein harvest via right lower extremity . HARVEST BONE GRAFT  1982  FOR THE  ANKLE . HEMIARTHROPLASTY HIP Left   AFTER HIP FX . INTRAOPERATIVE TRANSESOPHAGEAL ECHOCARDIOGRAM N/A 01/15/2013  Procedure: INTRAOPERATIVE TRANSESOPHAGEAL ECHOCARDIOGRAM;  Surgeon: [name], MD;  Location: [facility] OR;  Service: Open Heart Surgery;  Laterality: N/A; . JOINT REPLACEMENT   . LEFT AND RIGHT HEART CATHETERIZATION WITH CORONARY ANGIOGRAM N/A 12/20/2012  Procedure: LEFT AND RIGHT HEART CATHETERIZATION WITH CORONARY ANGIOGRAM;  Surgeon: Lesleigh Noe, MD;  Location: [facility] CATH LAB;  Service: Cardiovascular;  Laterality: N/A; . LEFT HEART CATH AND CORS/GRAFTS ANGIOGRAPHY N/A 02/14/2017  Procedure: LEFT HEART CATH AND CORS/GRAFTS ANGIOGRAPHY;  Surgeon: Marykay Lex, MD;  Location: [facility] INVASIVE CV LAB;  Service: Cardiovascular;  Laterality: N/A; . SKIN GRAFTS  1968 . TOTAL HIP ARTHROPLASTY Left 2007 . TOTAL KNEE ARTHROPLASTY  1992-2002  right-left . TRANSCATHETER AORTIC VALVE REPLACEMENT, TRANSAPICAL N/A 01/15/2013  Procedure: TRANSCATHETER AORTIC VALVE REPLACEMENT, TRANSAPICAL;  Surgeon: [name], MD;  Location: [facility] OR;  Service: Open Heart Surgery;  Laterality: N/A; . TRANSURETHRAL RESECTION OF PROSTATE  2012 HPI: 86 y.o. male with medical history significant of CAD, s/p TARV, CHF, CVA, hypothyroidism who presents to ER for worsening dyspnea for one week. CXR 1/9: "Bilateral lower lobe airspace opacities, right greater than leftwith small right effusion.  Findings worsening since prior study"  Subjective: Pt seen in radiology for MBS Assessment / Plan / Recommendation CHL IP CLINICAL IMPRESSIONS 04/08/2019 Clinical Impression Prior to initiating po presentations for MBS, pt was noted to have dry oral cavity with bright green residue, possibly medication. Oral care was completed with suction, with minimal clearing, due to poor participation. Pt presents with mild-moderate oropharyngeal dysphagia, characterized as follows: ORALLY, pt exhibits slow oral prep, poor bolus formation and prematures spillage over the tongue base. Extended and disorganized oral prep was noted on dys2 (soft, chopped) textures. Diffuse oral residue noted after the swallow on each consistency. PHARYNGEAL swallow is characterized by reflex trigger at the level of the vallecula on honey thick liquids, puree, and dys 2 (soft chopped) textures. Trigger noted at the pyriform sinus on nectar thick liquids. Penetration BEFORE the swallow was noted on nectar thick liquids via cup sip, however, no penetration was seen on nectar thick liquids via teaspoon. Post-swallow residue was noted in the vallecula, pyriform, and lateral channels after the swallow. Thin liquids and regular solids were not presented at this time, due to aspiration risk. Esophageal sweep revealed it slow to clear, however, no backflow into the pharynx was noted (as seen on February 2020 MBS). At this time, recommend puree diet and honey thick liquid via cup sip, meds crushed in puree, with 1:1 assist for all po intake. Pt may have teaspoon boluses of nectar thick liquid, however, cup sips of nectar thick liquid are NOT RECOMMENDED due to aspiration risk.  Pt may have small bites of Dys2 consistencies for oral pleasure or snacks, but puree textures are recommended for primary nutrition for energy conservation. Safe swallow precautions were sent with transport to be posted at [facility]. SLP will follow up for education, hopefully with family,  as pt is unable to comprehend and retain new information. MD and RN notified of results and recommendations.  SLP Visit Diagnosis Dysphagia, oropharyngeal phase (R13.12)     Impact on safety and function Moderate aspiration risk;Risk for inadequate nutrition/hydration   CHL IP TREATMENT RECOMMENDATION 04/08/2019 Treatment Recommendations Therapy as outlined in treatment plan below   Prognosis 04/08/2019 Prognosis for Safe Diet Advancement Fair Barriers to Reach Goals Cognitive deficits;Time post onset;Severity of deficits CHL IP DIET RECOMMENDATION 04/08/2019 SLP Diet Recommendations Dysphagia 1 (Puree) solids;Honey thick liquids Liquid Administration via Cup;No straw Medication Administration Crushed with puree Compensations Minimize environmental distractions;Slow rate;Small sips/bites Postural Changes Remain semi-upright after after feeds/meals (Comment);Seated upright at 90 degrees   CHL IP OTHER RECOMMENDATIONS 04/08/2019 Recommended Consults Consider esophageal assessment Oral Care Recommendations Oral care QID;Staff/trained caregiver to provide oral care Other Recommendations Order thickener from pharmacy;Have oral suction available   CHL IP FOLLOW UP RECOMMENDATIONS 04/08/2019 Follow up Recommendations Skilled Nursing facility;24 hour supervision/assistance   CHL IP FREQUENCY AND DURATION 04/08/2019 Speech Therapy Frequency (ACUTE ONLY) min 1 x/week Treatment Duration 1 week;2 weeks      CHL IP ORAL PHASE 04/08/2019 Oral Phase Impaired   Oral - Honey Cup Reduced posterior propulsion;Lingual/palatal residue;Delayed oral transit;Decreased bolus cohesion;Premature spillage   Oral - Nectar  Cup Reduced posterior propulsion;Lingual/palatal residue;Delayed oral transit;Decreased bolus cohesion;Premature spillage   Oral - Puree Reduced posterior propulsion;Lingual/palatal residue;Delayed oral transit;Decreased bolus cohesion;Premature spillage Oral - Mech Soft Reduced posterior propulsion;Lingual/palatal residue;Delayed  oral transit;Decreased bolus cohesion;Premature spillage    CHL IP PHARYNGEAL PHASE 04/08/2019 Pharyngeal Phase Impaired   Pharyngeal- Honey Cup, Teaspoon Delayed swallow initiation-vallecula;Reduced pharyngeal peristalsis;Reduced epiglottic inversion;Reduced anterior laryngeal mobility;Reduced laryngeal elevation;Reduced tongue base retraction;Pharyngeal residue - valleculae;Pharyngeal residue - pyriform   Pharyngeal- Nectar Teaspoon No penetration noted with limited bolus size via teaspoon   Pharyngeal- Nectar Cup Delayed swallow initiation-vallecula;Delayed swallow initiation-pyriform sinuses;Reduced pharyngeal peristalsis;Reduced epiglottic inversion;Reduced anterior laryngeal mobility;Reduced laryngeal elevation;Reduced airway/laryngeal closure;Reduced tongue base retraction;Penetration/Aspiration before swallow;Penetration/Aspiration during swallow;Pharyngeal residue - valleculae;Pharyngeal residue - pyriform Pharyngeal Material enters airway, remains ABOVE vocal cords and not ejected out   Pharyngeal- Puree Delayed swallow initiation-vallecula;Reduced pharyngeal peristalsis;Reduced epiglottic inversion;Reduced anterior laryngeal mobility;Reduced laryngeal elevation;Reduced tongue base retraction;Pharyngeal residue - valleculae;Pharyngeal residue - pyriform   Pharyngeal- Mechanical Soft Delayed swallow initiation-vallecula;Reduced pharyngeal peristalsis;Reduced epiglottic inversion;Reduced anterior laryngeal mobility;Reduced laryngeal elevation;Reduced tongue base retraction;Pharyngeal residue - valleculae;Pharyngeal residue - pyriform    CHL IP CERVICAL ESOPHAGEAL PHASE 04/08/2019 Cervical Esophageal Phase Leonarda Salon Harlow Asa, MSP, CCC-SLP Speech Language Pathologist Office: [phone] Pager: [phone] Leigh Aurora 04/08/2019, 10:59 AM              ECHOCARDIOGRAM LIMITED  Result Date: 03/30/2019   ECHOCARDIOGRAM LIMITED REPORT   Patient Name:   LOCHLIN EPPINGER Date of Exam: 03/30/2019 Medical Rec #:   [id number]      Height:       67.0 in Accession #:    [id number]     Weight:       150.8 lb Date of Birth:  25-Sep-1927      BSA:          1.79 m Patient Age:    89 years       BP:           108/62 mmHg Patient Gender: M              HR:           65 bpm. Exam Location:  Inpatient  Procedure: Limited Color Doppler, Cardiac Doppler and Limited Echo Indications:    acute systolic chf 428.21  History:        Patient has prior history of Echocardiogram examinations, most                 recent 02/19/2019. Aortic Valve: A 20mm Edwards Edwards Sapien                 bioprosthetic, stented aortic valve (TAVR)  Sonographer:    Delcie Roch Referring Phys: [id number] [place] THOMAS O'NEAL  Sonographer Comments: Image acquisition challenging due to uncooperative patient. IMPRESSIONS  1. Left ventricular ejection fraction, by visual estimation, is 25 to 30%. The left ventricle has severely decreased function. There is no increased left ventricular wall thickness.  2. Definity contrast agent was given IV to delineate the left ventricular endocardial borders.  3. Elevated left atrial pressure.  4. Left ventricular diastolic parameters are consistent with Grade III diastolic dysfunction (restrictive).  5. The left ventricle demonstrates global hypokinesis.  6. No LV thrombus.  7. Global right ventricle has moderately reduced systolic function.The right ventricular size is moderately enlarged. no increase in right ventricular wall thickness.  8. Left atrial size was severely dilated.  9. Presence of pericardial fat pad. 10. Mild mitral annular calcification. 11.  The mitral valve is degenerative. Moderate to severe mitral valve regurgitation. 12. The tricuspid valve was grossly normal. Tricuspid valve regurgitation is mild. 13. Tricuspid valve regurgitation is mild. 14. 29 mm Sapien 3. Normal gradients. Mild paravalvular leak. 15. TR signal is inadequate for assessing pulmonary artery systolic pressure. 16. The inferior vena cava is  dilated in size with <50% respiratory variability, suggesting right atrial pressure of 15 mmHg. 17. A prior study was performed on 02/19/2019. 18. EF remains largely unchanged from prior. 19. No significant change from prior study. 20. The interatrial septum was not assessed. FINDINGS  Left Ventricle: Left ventricular ejection fraction, by visual estimation, is 25 to 30%. The left ventricle has severely decreased function. Definity contrast agent was given IV to delineate the left ventricular endocardial borders. The left ventricle demonstrates global hypokinesis. The left ventricular internal cavity size was the left ventricle is normal in size. There is no increased left ventricular wall thickness. Left ventricular diastolic parameters are consistent with Grade III diastolic dysfunction (restrictive). Elevated left atrial pressure. No LV thrombus. Right Ventricle: The right ventricular size is moderately enlarged. No increase in right ventricular wall thickness. Global RV systolic function is has moderately reduced systolic function. Left Atrium: Left atrial size was severely dilated. Right Atrium: Right atrial size was mild-moderately dilated. Right atrial pressure is estimated at 15 mmHg. Pericardium: There is no evidence of pericardial effusion is seen. There is no evidence of pericardial effusion. Presence of pericardial fat pad. Mitral Valve: The mitral valve is degenerative in appearance. Mild mitral annular calcification. MV Area by PHT, 4.39 cm. MV PHT, 50.17 msec. Moderate to severe mitral valve regurgitation. Tricuspid Valve: The tricuspid valve is grossly normal. Tricuspid valve regurgitation is mild. Aortic Valve: The aortic valve has been repaired/replaced. Aortic valve regurgitation is mild. Aortic valve mean gradient measures 9.0 mmHg. Aortic valve peak gradient measures 15.4 mmHg. Aortic valve area, by VTI measures 1.92 cm. 29mm Edwards Edwards Sapien bioprosthetic, stented aortic valve (TAVR)  valve is present in the aortic position. Echo findings show normal structure and function of the aortic prosthesis. 29 mm Sapien 3. Normal gradients. Mild paravalvular leak. Pulmonic Valve: The pulmonic valve was grossly normal. Pulmonic valve regurgitation is mild by color flow Doppler. Pulmonic regurgitation is mild by color flow Doppler. Aorta: The aortic root is normal in size and structure. Venous: The inferior vena cava is dilated in size with less than 50% respiratory variability, suggesting right atrial pressure of 15 mmHg. Shunts: The interatrial septum was not assessed. Additional Comments: A prior study was performed on 02/19/2019.  LEFT VENTRICLE          Normals PLAX 2D LVIDd:         5.20 cm  3.6 cm   Diastology                 Normals LVIDs:         4.30 cm  1.7 cm   LV e' lateral:   6.74 cm/s 6.42 cm/s LV PW:         1.30 cm  1.4 cm   LV E/e' lateral: 19.1      15.4 LV IVS:        1.10 cm  1.3 cm   LV e' medial:    4.24 cm/s 6.96 cm/s LVOT diam:     2.00 cm  2.0 cm   LV E/e' medial:  30.4      6.96 LV SV:  46 ml    79 ml LV SV Index:   25.74    45 ml/m2 LVOT Area:     3.14 cm 3.14 cm2  LEFT ATRIUM           Index LA diam:      5.10 cm 2.84 cm/m LA Vol (A4C): 67.1 ml 37.41 ml/m  AORTIC VALVE                    Normals AV Area (Vmax):    1.91 cm AV Area (Vmean):   1.77 cm     3.06 cm2 AV Area (VTI):     1.92 cm AV Vmax:           196.50 cm/s AV Vmean:          138.000 cm/s 77 cm/s AV VTI:            0.376 m      3.15 cm2 AV Peak Grad:      15.4 mmHg AV Mean Grad:      9.0 mmHg     3 mmHg LVOT Vmax:         119.50 cm/s LVOT Vmean:        77.900 cm/s  75 cm/s LVOT VTI:          0.230 m      25.3 cm LVOT/AV VTI ratio: 0.61         1  AORTA                 Normals Ao Root diam: 2.80 cm 31 mm MITRAL VALVE              Normals MV Area (PHT): 4.39 cm              SHUNTS MV PHT:        50.17 msec 55 ms      Systemic VTI:  0.23 m MV Decel Time: 173 msec   187 ms     Systemic Diam: 2.00 cm MV E  velocity: 129.00 cm/s 103 cm/s MV A velocity: 65.30 cm/s  70.3 cm/s MV E/A ratio:  1.98        1.5  Eleonore Chiquito MD Electronically signed by Eleonore Chiquito MD Signature Date/Time: 03/30/2019/6:43:27 PMThe mitral valve is degenerative in appearance.    Final       Subjective: Mild distress. Nurse will provide dilaudid.  Discharge Exam: Vitals:   05/06/2019 0708 May 06, 2019 0807  BP: (!) 81/52 (!) 62/46  Pulse: 96 (!) 104  Resp: 20   Temp: (!) 97.5 F (36.4 C)   SpO2: 97% 96%     General: mild distress.  Cardiovascular: RRR, S1/S2 +, no rubs, no gallops Respiratory: CTA bilaterally, no wheezing, no rhonchi Abdominal: Soft, NT, ND, bowel sounds + Extremities: no edema, no cyanosis    The results of significant diagnostics from this hospitalization (including imaging, microbiology, ancillary and laboratory) are listed below for reference.     Microbiology: Recent Results (from the past 240 hour(s))  Culture, Urine     Status: None   Collection Time: 04/08/19  3:49 PM   Specimen: Urine, Random  Result Value Ref Range Status   Specimen Description URINE, RANDOM  Final   Special Requests NONE  Final   Culture   Final    NO GROWTH Performed at [facility], [address] [place], [place] [address]    Report Status 04/09/2019 FINAL  Final  Labs: BNP (last 3 results) Recent Labs    05/12/18 1603 02/18/19 1639 03/30/19 1046  BNP >4,500.0* 2,601.2* >4,500.0*   Basic Metabolic Panel: Recent Labs  Lab 04/04/19 0545 04/06/19 0309 04/07/19 0517 04/08/19 0718 04/09/19 0624  NA 145 149* 149* 154* 157*  K 4.2 4.4 5.1 5.1 5.1  CL 99 101 105 106 109  CO2 30 33* 34* 33* 31  GLUCOSE 115* 119* 104* 119* 109*  BUN 95* 98* 110* 134* 158*  CREATININE 1.98* 2.15* 2.52* 3.50* 4.17*  CALCIUM 8.8* 9.2 9.2 9.4 9.1  MG 2.5*  --   --   --   --    Liver Function Tests: Recent Labs  Lab 04/04/19 0545 04/06/19 0309 04/07/19 0517 04/08/19 0718 04/09/19 0624  AST 77* 58*  68* 325* 267*  ALT 83* 59* 52* 199* 195*  ALKPHOS 161* 120 121 203* 262*  BILITOT 0.3 0.6 1.2 1.6* 1.4*  PROT 6.6 6.8 7.3 7.0 7.0  ALBUMIN 3.1* 3.1* 3.2* 3.3* 3.2*   No results for input(s): LIPASE, AMYLASE in the last 168 hours. Recent Labs  Lab 04/04/19 0545  AMMONIA 21   CBC: Recent Labs  Lab 04/04/19 0545 04/06/19 0309 04/07/19 0517 04/08/19 0453 04/09/19 0624  WBC 9.0 11.1* 11.5* 12.7* 12.9*  NEUTROABS 4.7 6.2 6.0 6.5  --   HGB 8.6* 9.0* 9.2* 9.3* 9.1*  HCT 29.7* 30.7* 32.5* 32.8* 32.7*  MCV 90.3 90.0 90.8 91.1 92.4  PLT 172 179 204 217 194   Cardiac Enzymes: No results for input(s): CKTOTAL, CKMB, CKMBINDEX, TROPONINI in the last 168 hours. BNP: Invalid input(s): POCBNP CBG: No results for input(s): GLUCAP in the last 168 hours. D-Dimer No results for input(s): DDIMER in the last 72 hours. Hgb A1c No results for input(s): HGBA1C in the last 72 hours. Lipid Profile No results for input(s): CHOL, HDL, LDLCALC, TRIG, CHOLHDL, LDLDIRECT in the last 72 hours. Thyroid function studies No results for input(s): TSH, T4TOTAL, T3FREE, THYROIDAB in the last 72 hours.  Invalid input(s): FREET3 Anemia work up No results for input(s): VITAMINB12, FOLATE, FERRITIN, TIBC, IRON, RETICCTPCT in the last 72 hours. Urinalysis    Component Value Date/Time   COLORURINE AMBER (A) 04/08/2019 1416   APPEARANCEUR CLOUDY (A) 04/08/2019 1416   LABSPEC 1.017 04/08/2019 1416   PHURINE 9.0 (H) 04/08/2019 1416   GLUCOSEU NEGATIVE 04/08/2019 1416   HGBUR NEGATIVE 04/08/2019 1416   BILIRUBINUR NEGATIVE 04/08/2019 1416   KETONESUR NEGATIVE 04/08/2019 1416   PROTEINUR >=300 (A) 04/08/2019 1416   UROBILINOGEN 4.0 (H) 11/14/2014 1849   NITRITE NEGATIVE 04/08/2019 1416   LEUKOCYTESUR MODERATE (A) 04/08/2019 1416   Sepsis Labs Invalid input(s): PROCALCITONIN,  WBC,  LACTICIDVEN Microbiology Recent Results (from the past 240 hour(s))  Culture, Urine     Status: None   Collection Time:  04/08/19  3:49 PM   Specimen: Urine, Random  Result Value Ref Range Status   Specimen Description URINE, RANDOM  Final   Special Requests NONE  Final   Culture   Final    NO GROWTH Performed at [facility], [address] [place], [place] [address]    Report Status 04/09/2019 FINAL  Final     Time coordinating discharge: 40 minutes  SIGNED:   Alba Cory, MD  [facility]

## 2019-04-10 NOTE — TOC Transition Note (Signed)
Transition of Care [facility]) - CM/SW Discharge Note   Patient Details  Name: Hunter Choi MRN: [id number] Date of Birth: 1927/10/29  [facility]) CM/SW Contact:  Gildardo Griffes, LCSW Phone Number: 2019-04-19, 9:44 AM   Clinical Narrative:     Patient will DC to: [facility] Anticipated DC date: 04/19/19 Family notified: son Building control surveyor by: Sharin Mons  Per MD patient ready for DC to [facility] . RN, patient, patient's family, and facility notified of DC. Discharge Summary sent to facility. RN given number for report   [phone]. DC packet on chart. Ambulance transport requested for patient.  CSW signing off.  [place], [place] [phone]   Final next level of care: Hospice Medical Facility Barriers to Discharge: No Barriers Identified   Patient Goals and CMS Choice   CMS Medicare.gov Compare Post Acute Care list provided to:: Patient Represenative (must comment)(son [name]) Choice offered to / list presented to : Adult Children  Discharge Placement              Patient chooses bed at: Other - please specify in the comment section below:([facility]) Patient to be transferred to facility by: [facility] Name of family member notified: [name] Patient and family notified of of transfer: 2019-04-19  Discharge Plan and Services   Discharge Planning Services: CM Consult Post Acute Care Choice: Home Health          DME Arranged: (NA)         HH Arranged: RN, Disease Management HH Agency: [facility] Date HH Agency Contacted: 04/05/19 Time HH Agency Contacted: 1644 Representative spoke with at [facility] Agency: Tiffany  Social Determinants of Health (SDOH) Interventions     Readmission Risk Interventions Readmission Risk Prevention Plan 04/05/2019  Transportation Screening Complete  Medication Review [employer]) Complete  PCP or Specialist appointment within 3-5 days of discharge Complete  HRI or Home Care Consult Complete   SW Recovery Care/Counseling Consult Complete  Palliative Care Screening Complete  Skilled Nursing Facility Not Applicable  Some recent data might be hidden

## 2019-04-29 DEATH — deceased

## 2019-09-16 IMAGING — DX DG CHEST 1V PORT
1 series · 1 of 1 positions shown · non-contrast
Comparison: 04/24/2018

CLINICAL DATA: Short of breath

EXAM:
PORTABLE CHEST 1 VIEW

[chest]
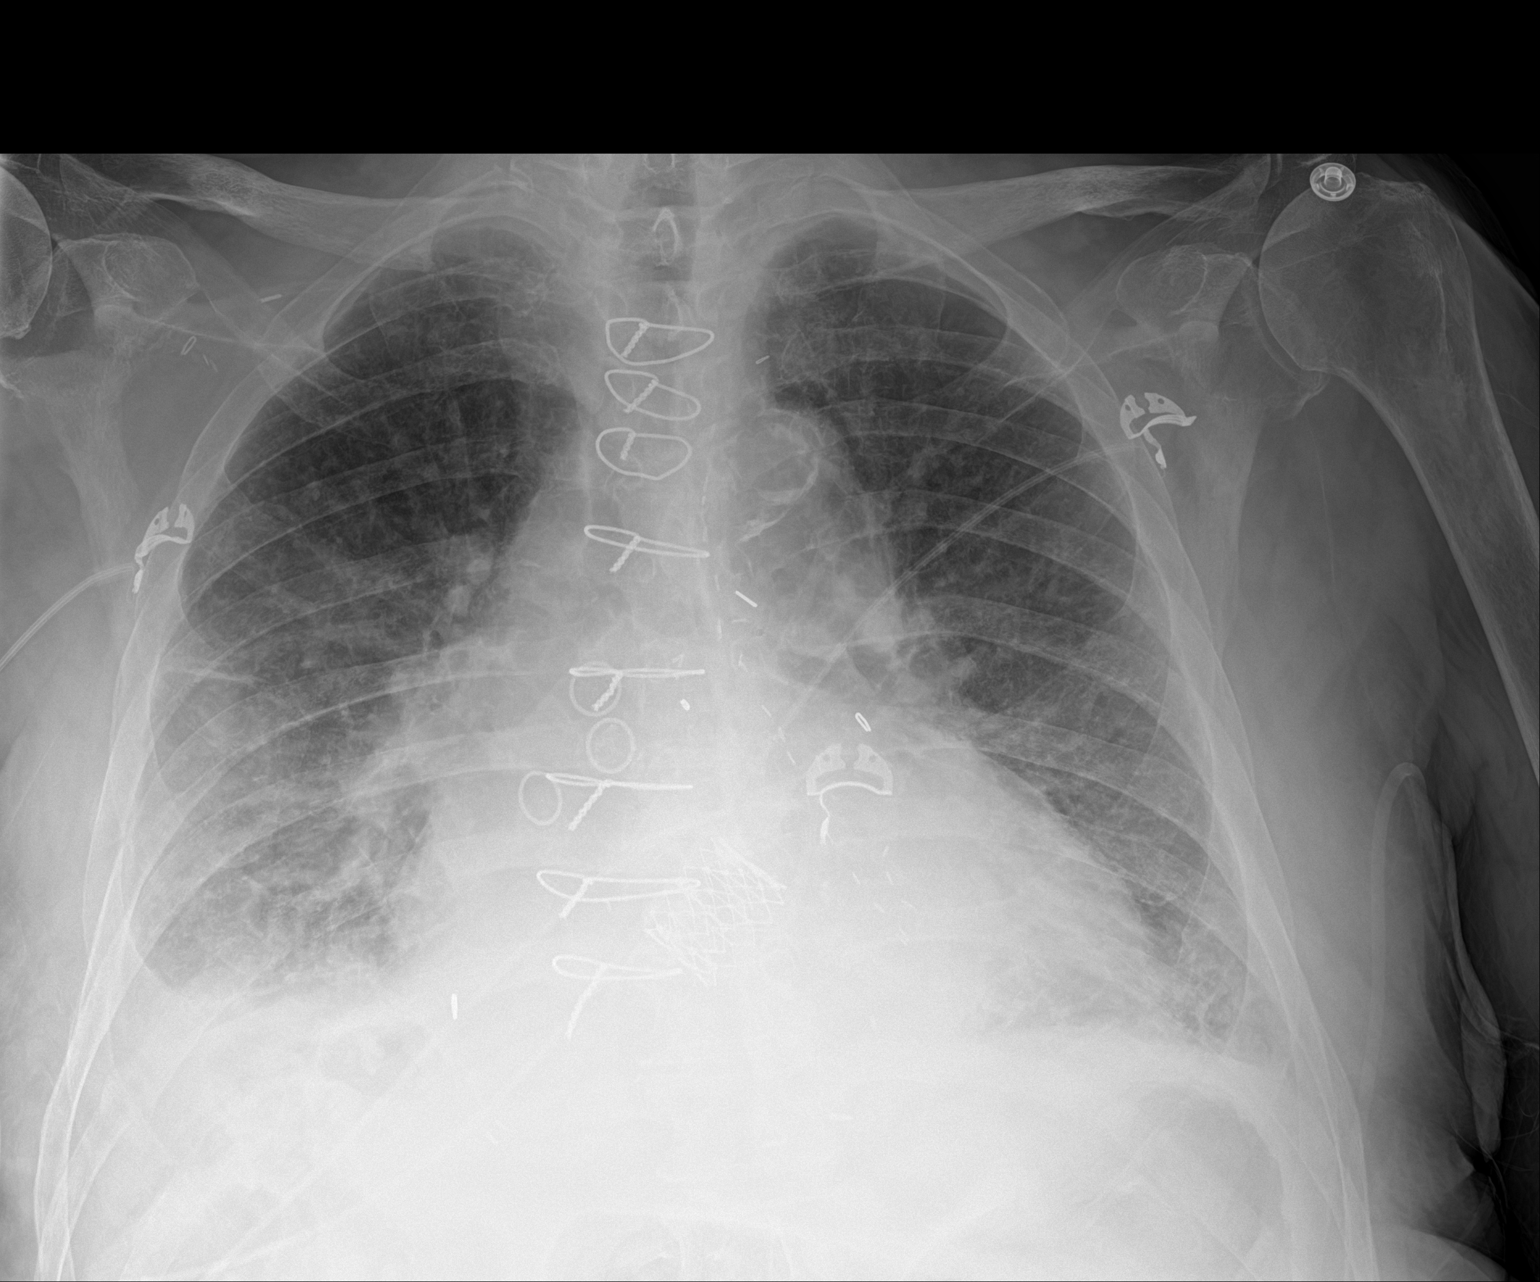

[1 of 1 positions shown; findings below may reference images not displayed]

FINDINGS: Cardiac enlargement. CABG and aortic valve replacement with TAVR.
Atherosclerotic aorta

Congestive heart failure with vascular congestion and mild edema.
Progression of bibasilar atelectasis and small effusions since the
prior study.
IMPRESSION: Congestive heart failure with mild progression from the prior study.

## 2019-10-02 IMAGING — MR MR HEAD W/O CM
10 of 11 series · 43 of 48 positions shown · non-contrast
Comparison: Comparison made with prior head CT from 05/12/2018

CLINICAL DATA: Initial evaluation for acute altered mental status,
slurred speech.

EXAM:
MRI HEAD WITHOUT CONTRAST
TECHNIQUE: Multiplanar, multiecho pulse sequences of the brain and surrounding
structures were obtained without intravenous contrast.

[Series 5: DWI · axial · 3.0mm · 0.88mm/px · z∈[-1,+130]mm · 10 of 95 slices shown (1 of 4)]
[im 1/95]
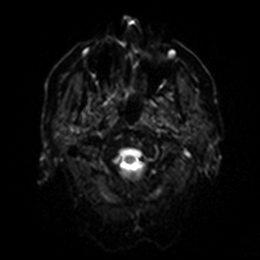
[im 11/95]
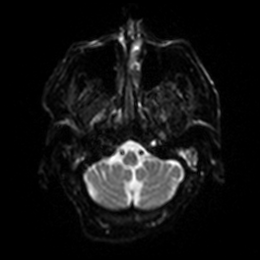
[im 21/95]
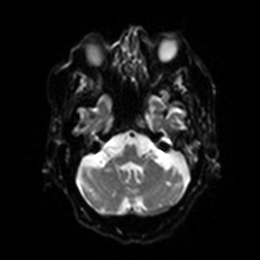
[im 32/95]
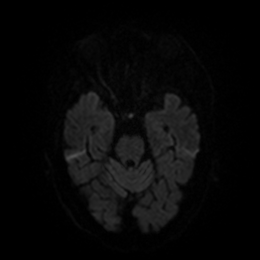
[im 42/95]
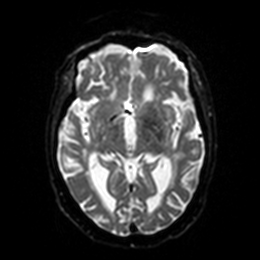
[im 53/95]
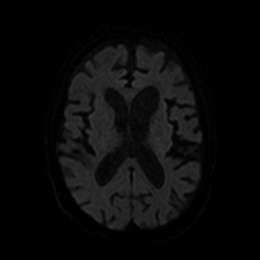
[im 63/95]
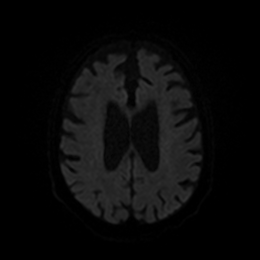
[im 74/95]
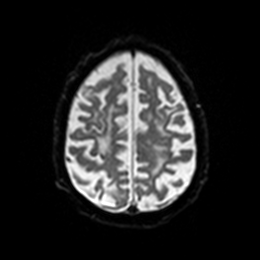
[im 84/95]
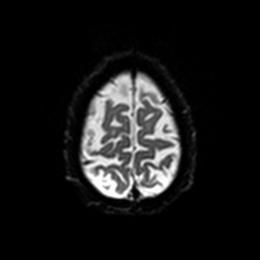
[im 95/95]
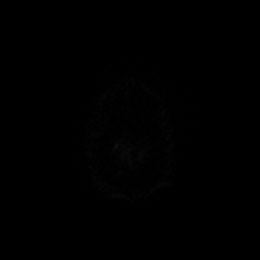

[Series 6: DWI · axial · 3.0mm · 0.88mm/px · z∈[-1,+130]mm · 4 of 48 slices shown (2 of 4)]
[im 1/48]
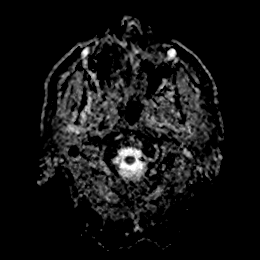
[im 16/48]
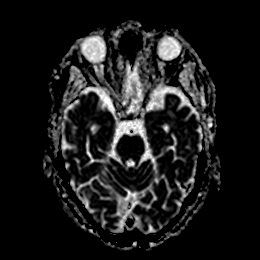
[im 32/48]
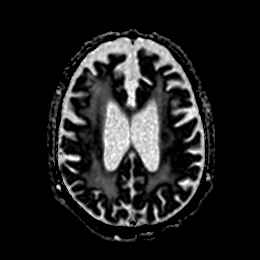
[im 48/48]
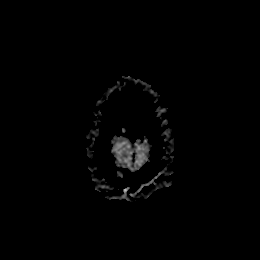

[Series 7: DWI · coronal · 4.0mm · 0.88mm/px · 7 of 72 slices shown (3 of 4)]
[im 1/72]
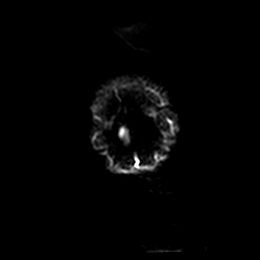
[im 12/72]
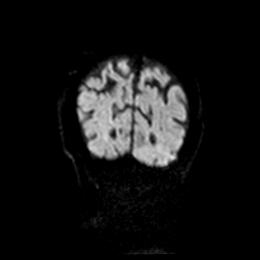
[im 24/72]
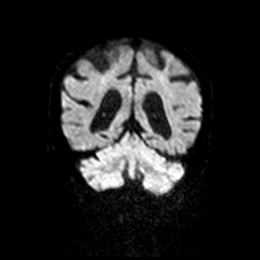
[im 36/72]
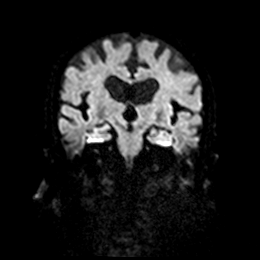
[im 48/72]
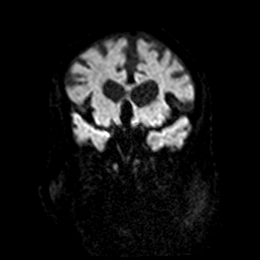
[im 60/72]
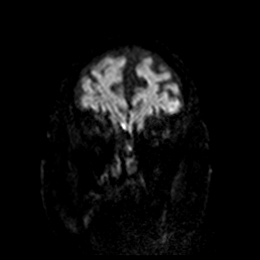
[im 72/72]
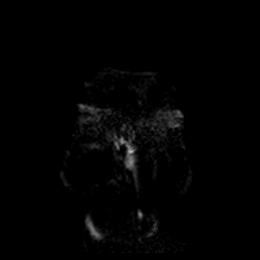

[Series 8: DWI · coronal · 4.0mm · 0.88mm/px · 3 of 36 slices shown (4 of 4)]
[im 1/36]
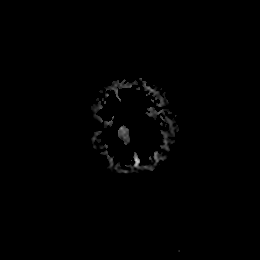
[im 18/36]
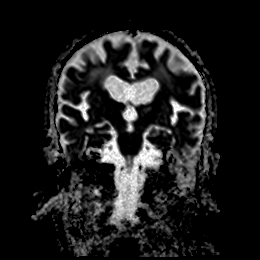
[im 36/36]
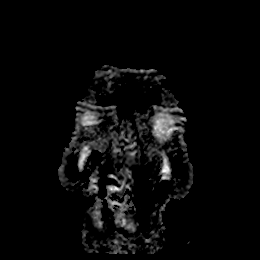

[Series 9: T1 · sagittal · 5.0mm · 0.75mm/px · 2 of 23 slices shown]
[im 1/23]
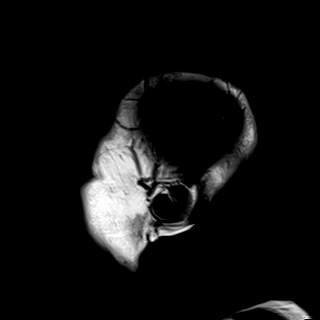
[im 23/23]
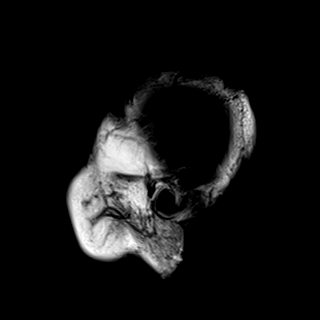

[Series 10: T2 · axial · 5.0mm · 0.72mm/px · z∈[+15,+144]mm · 2 of 25 slices shown (1 of 2)]
[im 1/25]
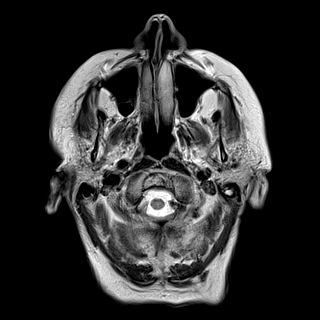
[im 25/25]
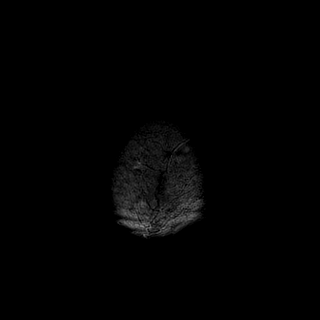

[Series 11: FLAIR · axial · 5.0mm · 0.45mm/px · z∈[+11,+140]mm · 2 of 25 slices shown]
[im 1/25]
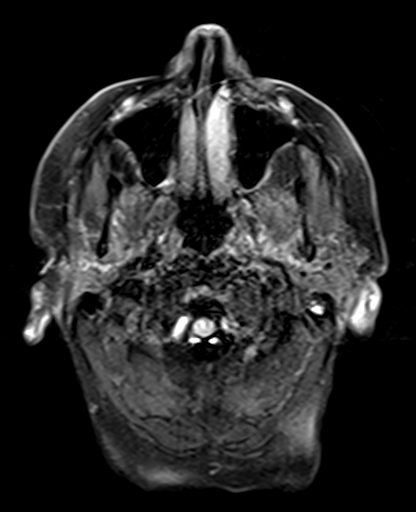
[im 25/25]
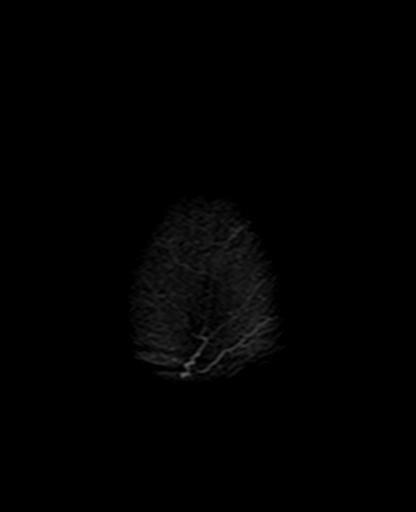

[Series 13: pha_images · axial · 3.0mm · 0.90mm/px · z∈[-1,+141]mm · 5 of 53 slices shown]
[im 1/53]
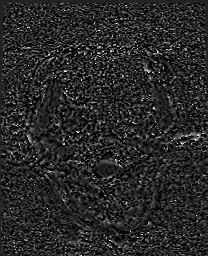
[im 14/53]
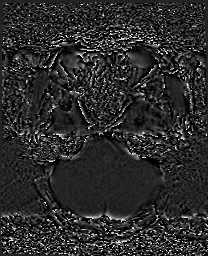
[im 27/53]
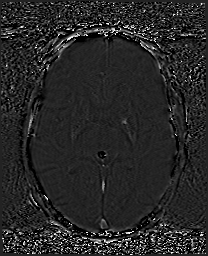
[im 40/53]
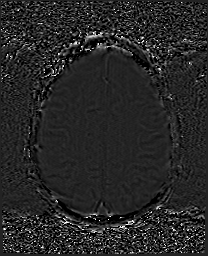
[im 53/53]
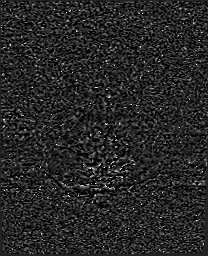

[Series 14: swi_images · axial · 3.0mm · 0.90mm/px · z∈[-4,+155]mm · 5 of 60 slices shown]
[im 1/60]
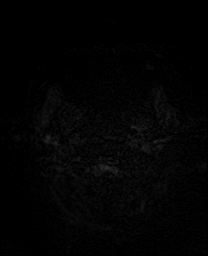
[im 15/60]
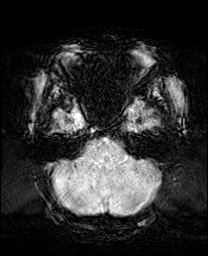
[im 30/60]
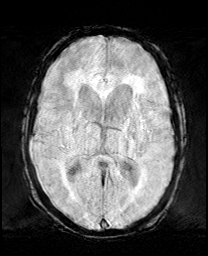
[im 45/60]
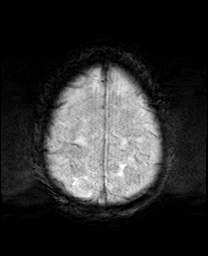
[im 60/60]
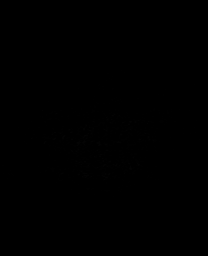

[Series 18: T2 · coronal · 5.0mm · 0.72mm/px · 3 of 30 slices shown (2 of 2)]
[im 1/30]
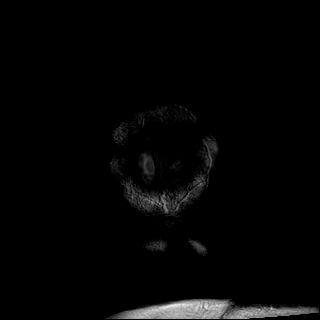
[im 15/30]
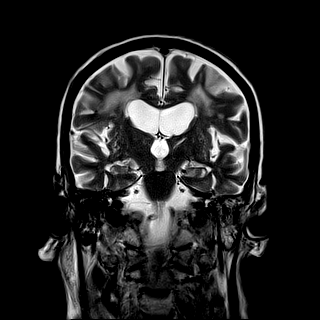
[im 30/30]
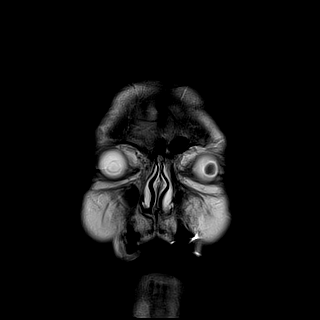

[43 of 48 positions shown; findings below may reference images not displayed]

FINDINGS: Brain: Advanced age-related cerebral atrophy. Extensive patchy and
confluent T2/FLAIR hyperintensity throughout the periventricular and
deep white matter both cerebral hemispheres, most consistent with
severe chronic microvascular ischemic disease. Probable superimposed
small remote right parietal cortical infarct. Multiple remote
lacunar infarcts seen involving the bilateral basal ganglia and
thalami, with multiple additional remote bilateral cerebellar
infarcts.

No abnormal foci of restricted diffusion to suggest acute or
subacute ischemia. Gray-white matter differentiation maintained. No
evidence for acute intracranial hemorrhage. Prominent chronic
microhemorrhage noted at the pons, likely small vessel related.

No mass lesion, midline shift or mass effect. Diffuse ventricular
prominence related to global parenchymal atrophy without
hydrocephalus. No extra-axial fluid collection. Pituitary gland
suprasellar region normal.

Vascular: Abnormal flow void within the left ICA to the level of the
terminus, likely occluded. Major intracranial vascular flow voids
otherwise maintained.

Skull and upper cervical spine: Craniocervical junction normal.
Scattered degenerative spondylolysis noted within the upper cervical
spine without significant stenosis. Bone marrow signal intensity
within normal limits. No scalp soft tissue abnormality.

Sinuses/Orbits: Patient status post ocular lens replacement on the
right. Globes and orbital soft tissues demonstrate no acute finding.
Scattered mucosal thickening within the ethmoidal air cells, likely
chronic. Bilateral mastoid effusions noted. Inner ear structures
grossly normal.

Other: None.
IMPRESSION: 1. No acute intracranial abnormality.
2. Advanced age-related cerebral atrophy with severe chronic
microvascular ischemic disease, with multiple remote lacunar
infarcts involving the bilateral basal ganglia, thalami, and pons,
with multiple chronic bilateral cerebellar infarcts.
3. Abnormal flow void within the left ICA to the level of the
terminus, likely occluded.
